# Patient Record
Sex: Male | Born: 1940 | Race: White | Hispanic: No | Smoking: Former smoker
Health system: Southern US, Community
[De-identification: ages and names within clinical notes are randomized; demographics above are authoritative.]

## PROBLEM LIST (undated history)

## (undated) ENCOUNTER — Emergency Department (HOSPITAL_COMMUNITY): Admission: EM | Payer: PRIVATE HEALTH INSURANCE

## (undated) DIAGNOSIS — L97509 Non-pressure chronic ulcer of other part of unspecified foot with unspecified severity: Secondary | ICD-10-CM

## (undated) DIAGNOSIS — J449 Chronic obstructive pulmonary disease, unspecified: Secondary | ICD-10-CM

## (undated) DIAGNOSIS — I998 Other disorder of circulatory system: Secondary | ICD-10-CM

## (undated) DIAGNOSIS — I10 Essential (primary) hypertension: Secondary | ICD-10-CM

## (undated) DIAGNOSIS — G709 Myoneural disorder, unspecified: Secondary | ICD-10-CM

## (undated) DIAGNOSIS — G4730 Sleep apnea, unspecified: Secondary | ICD-10-CM

## (undated) DIAGNOSIS — E119 Type 2 diabetes mellitus without complications: Secondary | ICD-10-CM

## (undated) DIAGNOSIS — E7800 Pure hypercholesterolemia, unspecified: Secondary | ICD-10-CM

## (undated) DIAGNOSIS — M1990 Unspecified osteoarthritis, unspecified site: Secondary | ICD-10-CM

## (undated) DIAGNOSIS — I739 Peripheral vascular disease, unspecified: Secondary | ICD-10-CM

## (undated) DIAGNOSIS — I5022 Chronic systolic (congestive) heart failure: Secondary | ICD-10-CM

## (undated) DIAGNOSIS — Z87442 Personal history of urinary calculi: Secondary | ICD-10-CM

## (undated) DIAGNOSIS — D62 Acute posthemorrhagic anemia: Secondary | ICD-10-CM

## (undated) DIAGNOSIS — R918 Other nonspecific abnormal finding of lung field: Secondary | ICD-10-CM

## (undated) DIAGNOSIS — I2510 Atherosclerotic heart disease of native coronary artery without angina pectoris: Secondary | ICD-10-CM

## (undated) DIAGNOSIS — M549 Dorsalgia, unspecified: Secondary | ICD-10-CM

## (undated) DIAGNOSIS — G629 Polyneuropathy, unspecified: Secondary | ICD-10-CM

## (undated) DIAGNOSIS — G8929 Other chronic pain: Secondary | ICD-10-CM

## (undated) DIAGNOSIS — I70229 Atherosclerosis of native arteries of extremities with rest pain, unspecified extremity: Secondary | ICD-10-CM

## (undated) DIAGNOSIS — K922 Gastrointestinal hemorrhage, unspecified: Secondary | ICD-10-CM

## (undated) DIAGNOSIS — Z952 Presence of prosthetic heart valve: Secondary | ICD-10-CM

## (undated) DIAGNOSIS — I219 Acute myocardial infarction, unspecified: Secondary | ICD-10-CM

## (undated) DIAGNOSIS — K219 Gastro-esophageal reflux disease without esophagitis: Secondary | ICD-10-CM

## (undated) HISTORY — DX: Atherosclerosis of native arteries of extremities with rest pain, unspecified extremity: I70.229

## (undated) HISTORY — DX: Other disorder of circulatory system: I99.8

## (undated) HISTORY — PX: OTHER SURGICAL HISTORY: SHX169

## (undated) HISTORY — PX: ROTATOR CUFF REPAIR: SHX139

## (undated) HISTORY — DX: Peripheral vascular disease, unspecified: I73.9

## (undated) HISTORY — PX: CATARACT EXTRACTION: SUR2

## (undated) HISTORY — PX: EYE SURGERY: SHX253

## (undated) HISTORY — DX: Chronic obstructive pulmonary disease, unspecified: J44.9

## (undated) HISTORY — DX: Sleep apnea, unspecified: G47.30

## (undated) HISTORY — PX: ANGIOPLASTY ILLIAC ARTERY: SHX5720

## (undated) HISTORY — PX: BACK SURGERY: SHX140

## (undated) HISTORY — DX: Pure hypercholesterolemia, unspecified: E78.00

## (undated) HISTORY — DX: Presence of prosthetic heart valve: Z95.2

---

## 1988-08-19 DIAGNOSIS — Z952 Presence of prosthetic heart valve: Secondary | ICD-10-CM

## 1988-08-19 HISTORY — DX: Presence of prosthetic heart valve: Z95.2

## 1988-08-19 HISTORY — PX: AORTIC VALVE REPLACEMENT: SHX41

## 1992-08-19 HISTORY — PX: CORONARY ARTERY BYPASS GRAFT: SHX141

## 2001-07-13 ENCOUNTER — Encounter: Payer: Self-pay | Admitting: Internal Medicine

## 2001-07-13 ENCOUNTER — Ambulatory Visit (HOSPITAL_COMMUNITY): Admission: RE | Admit: 2001-07-13 | Discharge: 2001-07-13 | Payer: Self-pay | Admitting: Internal Medicine

## 2001-07-20 ENCOUNTER — Ambulatory Visit (HOSPITAL_COMMUNITY): Admission: RE | Admit: 2001-07-20 | Discharge: 2001-07-20 | Payer: Self-pay | Admitting: Internal Medicine

## 2001-07-20 ENCOUNTER — Encounter: Payer: Self-pay | Admitting: Internal Medicine

## 2001-11-24 ENCOUNTER — Ambulatory Visit (HOSPITAL_COMMUNITY): Admission: RE | Admit: 2001-11-24 | Discharge: 2001-11-24 | Payer: Self-pay | Admitting: Internal Medicine

## 2001-11-24 ENCOUNTER — Encounter: Payer: Self-pay | Admitting: Internal Medicine

## 2001-11-26 ENCOUNTER — Ambulatory Visit (HOSPITAL_COMMUNITY): Admission: RE | Admit: 2001-11-26 | Discharge: 2001-11-26 | Payer: Self-pay | Admitting: Internal Medicine

## 2001-11-26 ENCOUNTER — Encounter: Payer: Self-pay | Admitting: Internal Medicine

## 2002-04-01 ENCOUNTER — Emergency Department (HOSPITAL_COMMUNITY): Admission: EM | Admit: 2002-04-01 | Discharge: 2002-04-01 | Payer: Self-pay | Admitting: Emergency Medicine

## 2002-06-30 ENCOUNTER — Encounter: Payer: Self-pay | Admitting: Cardiovascular Disease

## 2002-06-30 ENCOUNTER — Inpatient Hospital Stay (HOSPITAL_COMMUNITY): Admission: AD | Admit: 2002-06-30 | Discharge: 2002-07-09 | Payer: Self-pay | Admitting: Cardiovascular Disease

## 2002-07-03 ENCOUNTER — Encounter: Payer: Self-pay | Admitting: Cardiovascular Disease

## 2002-07-08 ENCOUNTER — Encounter: Payer: Self-pay | Admitting: Cardiovascular Disease

## 2002-11-18 ENCOUNTER — Encounter: Payer: Self-pay | Admitting: Internal Medicine

## 2002-11-18 ENCOUNTER — Ambulatory Visit (HOSPITAL_COMMUNITY): Admission: RE | Admit: 2002-11-18 | Discharge: 2002-11-18 | Payer: Self-pay | Admitting: Internal Medicine

## 2003-02-07 ENCOUNTER — Inpatient Hospital Stay (HOSPITAL_COMMUNITY): Admission: RE | Admit: 2003-02-07 | Discharge: 2003-02-10 | Payer: Self-pay | Admitting: Cardiovascular Disease

## 2003-02-07 ENCOUNTER — Encounter: Payer: Self-pay | Admitting: Cardiovascular Disease

## 2003-05-23 ENCOUNTER — Encounter: Payer: Self-pay | Admitting: Internal Medicine

## 2003-05-23 ENCOUNTER — Inpatient Hospital Stay (HOSPITAL_COMMUNITY): Admission: AD | Admit: 2003-05-23 | Discharge: 2003-05-26 | Payer: Self-pay | Admitting: Internal Medicine

## 2003-05-25 ENCOUNTER — Encounter: Payer: Self-pay | Admitting: Cardiovascular Disease

## 2003-08-20 HISTORY — PX: CORONARY STENT PLACEMENT: SHX1402

## 2003-12-28 ENCOUNTER — Inpatient Hospital Stay (HOSPITAL_COMMUNITY): Admission: AD | Admit: 2003-12-28 | Discharge: 2004-01-03 | Payer: Self-pay | Admitting: Cardiovascular Disease

## 2004-06-07 ENCOUNTER — Inpatient Hospital Stay (HOSPITAL_COMMUNITY): Admission: EM | Admit: 2004-06-07 | Discharge: 2004-06-18 | Payer: Self-pay | Admitting: Emergency Medicine

## 2004-06-11 ENCOUNTER — Encounter (INDEPENDENT_AMBULATORY_CARE_PROVIDER_SITE_OTHER): Payer: Self-pay | Admitting: *Deleted

## 2004-06-26 ENCOUNTER — Ambulatory Visit (HOSPITAL_COMMUNITY): Admission: RE | Admit: 2004-06-26 | Discharge: 2004-06-26 | Payer: Self-pay | Admitting: Cardiology

## 2004-10-17 ENCOUNTER — Inpatient Hospital Stay (HOSPITAL_COMMUNITY): Admission: AD | Admit: 2004-10-17 | Discharge: 2004-10-23 | Payer: Self-pay | Admitting: Cardiovascular Disease

## 2005-01-07 ENCOUNTER — Inpatient Hospital Stay (HOSPITAL_COMMUNITY): Admission: RE | Admit: 2005-01-07 | Discharge: 2005-01-12 | Payer: Self-pay | Admitting: Vascular Surgery

## 2005-08-19 DIAGNOSIS — E119 Type 2 diabetes mellitus without complications: Secondary | ICD-10-CM

## 2005-08-19 HISTORY — DX: Type 2 diabetes mellitus without complications: E11.9

## 2006-09-14 ENCOUNTER — Emergency Department (HOSPITAL_COMMUNITY): Admission: EM | Admit: 2006-09-14 | Discharge: 2006-09-14 | Payer: Self-pay | Admitting: Emergency Medicine

## 2007-08-24 ENCOUNTER — Ambulatory Visit (HOSPITAL_COMMUNITY): Admission: RE | Admit: 2007-08-24 | Discharge: 2007-08-24 | Payer: Self-pay | Admitting: Ophthalmology

## 2007-09-13 ENCOUNTER — Inpatient Hospital Stay (HOSPITAL_COMMUNITY): Admission: EM | Admit: 2007-09-13 | Discharge: 2007-09-18 | Payer: Self-pay | Admitting: Emergency Medicine

## 2009-02-06 ENCOUNTER — Ambulatory Visit (HOSPITAL_COMMUNITY): Admission: RE | Admit: 2009-02-06 | Discharge: 2009-02-06 | Payer: Self-pay | Admitting: Ophthalmology

## 2009-09-22 ENCOUNTER — Ambulatory Visit (HOSPITAL_COMMUNITY): Admission: RE | Admit: 2009-09-22 | Discharge: 2009-09-22 | Payer: Self-pay | Admitting: Internal Medicine

## 2010-06-04 ENCOUNTER — Inpatient Hospital Stay (HOSPITAL_COMMUNITY)
Admission: RE | Admit: 2010-06-04 | Discharge: 2010-06-11 | Payer: Self-pay | Source: Home / Self Care | Admitting: Cardiovascular Disease

## 2010-07-19 ENCOUNTER — Inpatient Hospital Stay (HOSPITAL_COMMUNITY)
Admission: RE | Admit: 2010-07-19 | Discharge: 2010-07-24 | Payer: Self-pay | Source: Home / Self Care | Admitting: Cardiovascular Disease

## 2010-09-14 ENCOUNTER — Emergency Department (HOSPITAL_COMMUNITY)
Admission: EM | Admit: 2010-09-14 | Discharge: 2010-09-14 | Payer: Self-pay | Source: Home / Self Care | Admitting: Emergency Medicine

## 2010-09-14 ENCOUNTER — Ambulatory Visit (HOSPITAL_COMMUNITY)
Admission: RE | Admit: 2010-09-14 | Discharge: 2010-09-14 | Payer: Self-pay | Source: Home / Self Care | Attending: Internal Medicine | Admitting: Internal Medicine

## 2010-09-17 ENCOUNTER — Ambulatory Visit
Admission: RE | Admit: 2010-09-17 | Discharge: 2010-09-17 | Payer: Self-pay | Source: Home / Self Care | Attending: Orthopedic Surgery | Admitting: Orthopedic Surgery

## 2010-09-17 ENCOUNTER — Encounter: Payer: Self-pay | Admitting: Orthopedic Surgery

## 2010-09-17 DIAGNOSIS — S92309A Fracture of unspecified metatarsal bone(s), unspecified foot, initial encounter for closed fracture: Secondary | ICD-10-CM | POA: Insufficient documentation

## 2010-09-26 NOTE — Assessment & Plan Note (Signed)
Summary: EVAL/TREAT FX 5TH METATARSAL/HAD XRAY/REF DR FUSCO/SEC HORIZ/...   Vital Signs:  Patient profile:   70 year old male Height:      70 inches Weight:      222 pounds Pulse rate:   72 / minute Resp:     16 per minute  Vitals Entered By: Fuller [name] MD (September 17, 2010 2:26 PM)  Visit Type:  new patient Referring Provider:  ap er Primary Provider:  Dr. Sherwood Gambler  CC:  left foot pain.  History of Present Illness: I saw Tanner Pena in the office today for an initial visit.  He is a 70 years old man with the complaint of:  left foot pain.  DOI 1 month ago.  Xrays [facility] 09/14/10 left foot.  medications are scanned into the chart.  The patient was injured about 4 weeks ago, had an x-ray over the weekend, which showed a metatarsal fracture. #5 longitudinal split with slight spiral component. Complains of minimal discomfort now, but he's been in a Cam Walker for the weekend.  No bruising, had some swelling.  Her pain level now is 3/10. Lambert Mody tends to come and go. Improved with nonweightbearing or not walking, worse with walking without the boot   Allergies (verified): No Known Drug Allergies  Past History:  Past Medical History: htn cholesterol diabetes asthma gout  Past Surgical History: back open heart stents in heart rt and left leg  Family History: Family History of Diabetes Family History Coronary Heart Disease male < 51 Family History of Arthritis  Social History: smokes 1/2 ppd alcohol use sometimes no caffeine no schooling  Review of Systems Constitutional:  Denies weight loss, weight gain, fever, chills, and fatigue. Cardiovascular:  Denies chest pain, palpitations, fainting, and murmurs. Respiratory:  Complains of short of breath and couch; denies wheezing, tightness, pain on inspiration, and snoring . Gastrointestinal:  Denies heartburn, nausea, vomiting, diarrhea, constipation, and blood in your stools. Genitourinary:  Denies  frequency, urgency, difficulty urinating, painful urination, flank pain, and bleeding in urine. Neurologic:  Denies numbness, tingling, unsteady gait, dizziness, tremors, and seizure. Musculoskeletal:  Complains of swelling; denies joint pain, instability, stiffness, redness, heat, and muscle pain. Endocrine:  Denies excessive thirst, exessive urination, and heat or cold intolerance. Psychiatric:  Denies nervousness, depression, anxiety, and hallucinations. Skin:  Denies changes in the skin, poor healing, rash, itching, and redness. HEENT:  Denies blurred or double vision, eye pain, redness, and watering. Immunology:  Denies seasonal allergies, sinus problems, and allergic to bee stings. Hemoatologic:  Complains of easy bleeding; denies brusing.  Physical Exam  Additional Exam:  height is 5 feet 10, weight is 222 pounds.  He is awake, alert, and oriented x3. His mood and affect are normal. Gait, station is remarkable for a slight limp when he is in the boot.  His feet are hyperemic. He does have a palpable dorsalis pedis pulse. He has nail changes consistent with vascular disease. He has normal range of motion ankle discomfort over the fracture site. Skin remains intact. Sensation is normal. His coordination balancing normal as well.  Muscle tone is normal in the foot and ankle. Minimal swelling   Impression & Recommendations:  Problem # 1:  CLOSED FRACTURE OF METATARSAL BONE (ICD-825.25) Assessment New  The x-rays were done at [facility]. The report and the films have been reviewed.  Orders: New Patient Level III (16109) Metatarsal Fx (60454)  Patient Instructions: 1)  Please schedule a follow-up appointment in 4 weeks for xrays of the  left foot    Orders Added: 1)  New Patient Level III [46962] 2)  Metatarsal Fx [95284]

## 2010-09-26 NOTE — Letter (Signed)
Summary: *Orthopedic Consult Note  [facility]  [address] [place] [address]  [place], [place] [address]   Phone: [phone]  Fax: [phone]    Re:    [name] DOB:    August 11, 1941   Dear: Dear Peyton Najjar.   I' m sorry I could not see this patient Fri, I was in surgery and had a delay.   A copy of the detailed office note will be sent under separate cover, for your review.  Evaluation today is consistent with:  1)  CLOSED FRACTURE OF METATARSAL BONE (ICD-825.25)      Our recommendation is for: CAM walker and repeat x-rays in 4 weeks        Thank you for this opportunity to look after your patient.  Sincerely,   Terrance Mass. MD.

## 2010-10-16 NOTE — Letter (Signed)
Summary: History form  History form   Imported By: Jacklynn Ganong 10/09/2010 14:06:05  _____________________________________________________________________  External Attachment:    Type:   Image     Comment:   External Document

## 2010-10-16 NOTE — Medication Information (Signed)
Summary: RX Folder medication list  RX Folder medication list   Imported By: Cammie Sickle 10/09/2010 19:40:37  _____________________________________________________________________  External Attachment:    Type:   Image     Comment:   External Document

## 2010-10-18 ENCOUNTER — Other Ambulatory Visit: Payer: Self-pay | Admitting: Orthopedic Surgery

## 2010-10-18 ENCOUNTER — Encounter: Payer: Self-pay | Admitting: Orthopedic Surgery

## 2010-10-18 ENCOUNTER — Ambulatory Visit (INDEPENDENT_AMBULATORY_CARE_PROVIDER_SITE_OTHER): Payer: Medicare Other | Admitting: Orthopedic Surgery

## 2010-10-18 DIAGNOSIS — S92309A Fracture of unspecified metatarsal bone(s), unspecified foot, initial encounter for closed fracture: Secondary | ICD-10-CM

## 2010-10-25 NOTE — Assessment & Plan Note (Signed)
Summary: 4 wk RE-CK/XRAY LT FOOT/FX CARE/[employer] + CA MCD/CAF (R/S d/t vis...   Visit Type:  Follow-up Referring Provider:  ap er Primary Provider:  Dr. Sherwood Gambler  CC:  fx care.  History of Present Illness: I saw Tanner Pena in the office today for a visit.  He is a 70 years old man with the complaint of:  left foot pain. Problem # 1:  CLOSED FRACTURE OF METATARSAL BONE (ICD-825.25)  DOI 2 months ago.  Xrays [facility] 09/14/10 left foot.  meds: percocet   Today is recheck with xrays of the left foot after cam walker treatment.  Doing better.  No new injuries.  Pain level is around 2 sometimes. he is walking fine at this point. He has minimal symptoms unless he walks for a "good piece "    Allergies: No Known Drug Allergies   Impression & Recommendations:  Problem # 1:  CLOSED FRACTURE OF METATARSAL BONE (ICD-825.25) Assessment Improved  Separate and Identifiable X-Ray report      3 views left foot   spiral MTT fracture # 5  probably fibrous union   Orders: Post-Op Check (98119) Foot x-ray complete, minimum 3 views (14782)  Patient Instructions: 1)  Please schedule a follow-up appointment as needed.   Orders Added: 1)  Post-Op Check [99024] 2)  Foot x-ray complete, minimum 3 views [73630]

## 2010-10-29 LAB — CBC
HCT: 38.7 % — ABNORMAL LOW (ref 39.0–52.0)
HCT: 39.4 % (ref 39.0–52.0)
HCT: 40.9 % (ref 39.0–52.0)
HCT: 41.4 % (ref 39.0–52.0)
Hemoglobin: 13.1 g/dL (ref 13.0–17.0)
Hemoglobin: 13.7 g/dL (ref 13.0–17.0)
MCH: 30.6 pg (ref 26.0–34.0)
MCH: 30.7 pg (ref 26.0–34.0)
MCHC: 33.1 g/dL (ref 30.0–36.0)
MCHC: 33.2 g/dL (ref 30.0–36.0)
MCHC: 33.5 g/dL (ref 30.0–36.0)
MCHC: 33.6 g/dL (ref 30.0–36.0)
MCV: 91.7 fL (ref 78.0–100.0)
MCV: 92.3 fL (ref 78.0–100.0)
MCV: 92.4 fL (ref 78.0–100.0)
Platelets: 165 10*3/uL (ref 150–400)
Platelets: 169 10*3/uL (ref 150–400)
Platelets: 177 10*3/uL (ref 150–400)
RBC: 4.32 MIL/uL (ref 4.22–5.81)
RDW: 14.1 % (ref 11.5–15.5)
RDW: 14.2 % (ref 11.5–15.5)
WBC: 7.2 10*3/uL (ref 4.0–10.5)

## 2010-10-29 LAB — PROTIME-INR
INR: 1.04 (ref 0.00–1.49)
INR: 1.92 — ABNORMAL HIGH (ref 0.00–1.49)
Prothrombin Time: 13.8 seconds (ref 11.6–15.2)
Prothrombin Time: 14.7 seconds (ref 11.6–15.2)
Prothrombin Time: 17.9 seconds — ABNORMAL HIGH (ref 11.6–15.2)

## 2010-10-29 LAB — URINE MICROSCOPIC-ADD ON

## 2010-10-29 LAB — URINALYSIS, DIPSTICK ONLY
Glucose, UA: 100 mg/dL — AB
Ketones, ur: 15 mg/dL — AB
Nitrite: POSITIVE — AB
Protein, ur: >300 mg/dL — AB
Specific Gravity, Urine: 1.046 — ABNORMAL HIGH (ref 1.005–1.030)
Urobilinogen, UA: 0.2 mg/dL (ref 0.0–1.0)
pH: 6.5 (ref 5.0–8.0)

## 2010-10-29 LAB — GLUCOSE, CAPILLARY
Glucose-Capillary: 145 mg/dL — ABNORMAL HIGH (ref 70–99)
Glucose-Capillary: 151 mg/dL — ABNORMAL HIGH (ref 70–99)
Glucose-Capillary: 157 mg/dL — ABNORMAL HIGH (ref 70–99)
Glucose-Capillary: 157 mg/dL — ABNORMAL HIGH (ref 70–99)
Glucose-Capillary: 159 mg/dL — ABNORMAL HIGH (ref 70–99)
Glucose-Capillary: 167 mg/dL — ABNORMAL HIGH (ref 70–99)
Glucose-Capillary: 171 mg/dL — ABNORMAL HIGH (ref 70–99)
Glucose-Capillary: 179 mg/dL — ABNORMAL HIGH (ref 70–99)
Glucose-Capillary: 183 mg/dL — ABNORMAL HIGH (ref 70–99)
Glucose-Capillary: 195 mg/dL — ABNORMAL HIGH (ref 70–99)
Glucose-Capillary: 237 mg/dL — ABNORMAL HIGH (ref 70–99)

## 2010-10-29 LAB — URINALYSIS, ROUTINE W REFLEX MICROSCOPIC
Bilirubin Urine: NEGATIVE
Glucose, UA: NEGATIVE mg/dL
Ketones, ur: NEGATIVE mg/dL
Nitrite: NEGATIVE
Protein, ur: 100 mg/dL — AB
Specific Gravity, Urine: 1.038 — ABNORMAL HIGH (ref 1.005–1.030)
Urobilinogen, UA: 0.2 mg/dL (ref 0.0–1.0)
pH: 7 (ref 5.0–8.0)

## 2010-10-29 LAB — URINE CULTURE
Colony Count: 6000
Culture  Setup Time: 201112011742

## 2010-10-29 LAB — BASIC METABOLIC PANEL
BUN: 15 mg/dL (ref 6–23)
CO2: 26 mEq/L (ref 19–32)
Calcium: 9.2 mg/dL (ref 8.4–10.5)
Chloride: 102 mEq/L (ref 96–112)
Creatinine, Ser: 0.99 mg/dL (ref 0.4–1.5)
GFR calc Af Amer: >60 mL/min (ref >60)
GFR calc non Af Amer: >60 mL/min (ref >60)
Glucose, Bld: 112 mg/dL — ABNORMAL HIGH (ref 70–99)
Potassium: 3.3 mEq/L — ABNORMAL LOW (ref 3.5–5.1)
Sodium: 139 mEq/L (ref 135–145)

## 2010-10-29 LAB — HEPARIN LEVEL (UNFRACTIONATED)
Heparin Unfractionated: 0.14 IU/mL — ABNORMAL LOW (ref 0.30–0.70)
Heparin Unfractionated: 0.37 IU/mL (ref 0.30–0.70)
Heparin Unfractionated: 0.45 IU/mL (ref 0.30–0.70)
Heparin Unfractionated: 0.48 IU/mL (ref 0.30–0.70)

## 2010-10-29 LAB — CARDIAC PANEL(CRET KIN+CKTOT+MB+TROPI)
CK, MB: 2.6 ng/mL (ref 0.3–4.0)
CK, MB: 3.8 ng/mL (ref 0.3–4.0)
Relative Index: 2.8 — ABNORMAL HIGH (ref 0.0–2.5)
Relative Index: INVALID (ref 0.0–2.5)
Total CK: 138 U/L (ref 7–232)
Total CK: 87 U/L (ref 7–232)
Troponin I: 0.03 ng/mL (ref 0.00–0.06)
Troponin I: 0.04 ng/mL (ref 0.00–0.06)

## 2010-10-29 LAB — CK TOTAL AND CKMB (NOT AT ARMC)
CK, MB: 2.3 ng/mL (ref 0.3–4.0)
Relative Index: INVALID (ref 0.0–2.5)
Total CK: 82 U/L (ref 7–232)

## 2010-10-29 LAB — BRAIN NATRIURETIC PEPTIDE: Pro B Natriuretic peptide (BNP): <30 pg/mL (ref 0.0–100.0)

## 2010-10-29 LAB — TROPONIN I: Troponin I: 0.03 ng/mL (ref 0.00–0.06)

## 2010-10-31 LAB — GLUCOSE, CAPILLARY
Glucose-Capillary: 138 mg/dL — ABNORMAL HIGH (ref 70–99)
Glucose-Capillary: 141 mg/dL — ABNORMAL HIGH (ref 70–99)
Glucose-Capillary: 144 mg/dL — ABNORMAL HIGH (ref 70–99)
Glucose-Capillary: 145 mg/dL — ABNORMAL HIGH (ref 70–99)
Glucose-Capillary: 161 mg/dL — ABNORMAL HIGH (ref 70–99)
Glucose-Capillary: 168 mg/dL — ABNORMAL HIGH (ref 70–99)
Glucose-Capillary: 169 mg/dL — ABNORMAL HIGH (ref 70–99)
Glucose-Capillary: 175 mg/dL — ABNORMAL HIGH (ref 70–99)
Glucose-Capillary: 180 mg/dL — ABNORMAL HIGH (ref 70–99)
Glucose-Capillary: 180 mg/dL — ABNORMAL HIGH (ref 70–99)
Glucose-Capillary: 186 mg/dL — ABNORMAL HIGH (ref 70–99)
Glucose-Capillary: 195 mg/dL — ABNORMAL HIGH (ref 70–99)
Glucose-Capillary: 196 mg/dL — ABNORMAL HIGH (ref 70–99)
Glucose-Capillary: 231 mg/dL — ABNORMAL HIGH (ref 70–99)

## 2010-10-31 LAB — HEPARIN LEVEL (UNFRACTIONATED)
Heparin Unfractionated: 0.2 IU/mL — ABNORMAL LOW (ref 0.30–0.70)
Heparin Unfractionated: 0.28 IU/mL — ABNORMAL LOW (ref 0.30–0.70)
Heparin Unfractionated: 0.38 IU/mL (ref 0.30–0.70)
Heparin Unfractionated: 0.39 IU/mL (ref 0.30–0.70)
Heparin Unfractionated: 0.41 IU/mL (ref 0.30–0.70)
Heparin Unfractionated: 0.43 IU/mL (ref 0.30–0.70)
Heparin Unfractionated: 0.48 IU/mL (ref 0.30–0.70)
Heparin Unfractionated: <0.1 IU/mL — ABNORMAL LOW (ref 0.30–0.70)

## 2010-10-31 LAB — CBC
HCT: 41.2 % (ref 39.0–52.0)
HCT: 42.1 % (ref 39.0–52.0)
Hemoglobin: 13.8 g/dL (ref 13.0–17.0)
Hemoglobin: 14.2 g/dL (ref 13.0–17.0)
Hemoglobin: 14.3 g/dL (ref 13.0–17.0)
MCH: 30.8 pg (ref 26.0–34.0)
MCH: 30.9 pg (ref 26.0–34.0)
MCH: 30.9 pg (ref 26.0–34.0)
MCH: 31.1 pg (ref 26.0–34.0)
MCH: 31.4 pg (ref 26.0–34.0)
MCHC: 33.1 g/dL (ref 30.0–36.0)
MCHC: 33.5 g/dL (ref 30.0–36.0)
MCV: 92.3 fL (ref 78.0–100.0)
MCV: 92.3 fL (ref 78.0–100.0)
MCV: 92.8 fL (ref 78.0–100.0)
MCV: 94.3 fL (ref 78.0–100.0)
Platelets: 168 10*3/uL (ref 150–400)
Platelets: 184 10*3/uL (ref 150–400)
Platelets: 192 10*3/uL (ref 150–400)
Platelets: 200 10*3/uL (ref 150–400)
Platelets: 202 10*3/uL (ref 150–400)
RBC: 4.4 MIL/uL (ref 4.22–5.81)
RBC: 4.56 MIL/uL (ref 4.22–5.81)
RBC: 4.56 MIL/uL (ref 4.22–5.81)
RBC: 4.68 MIL/uL (ref 4.22–5.81)
RDW: 14.2 % (ref 11.5–15.5)
RDW: 14.4 % (ref 11.5–15.5)
RDW: 14.5 % (ref 11.5–15.5)
WBC: 7.1 10*3/uL (ref 4.0–10.5)
WBC: 7.3 10*3/uL (ref 4.0–10.5)
WBC: 7.4 10*3/uL (ref 4.0–10.5)

## 2010-10-31 LAB — PROTIME-INR
INR: 1.01 (ref 0.00–1.49)
INR: 1.04 (ref 0.00–1.49)
INR: 1.15 (ref 0.00–1.49)
INR: 1.57 — ABNORMAL HIGH (ref 0.00–1.49)
Prothrombin Time: 13.5 seconds (ref 11.6–15.2)
Prothrombin Time: 14.9 seconds (ref 11.6–15.2)
Prothrombin Time: 21.5 seconds — ABNORMAL HIGH (ref 11.6–15.2)
Prothrombin Time: 29.9 seconds — ABNORMAL HIGH (ref 11.6–15.2)

## 2010-10-31 LAB — BASIC METABOLIC PANEL
BUN: 17 mg/dL (ref 6–23)
Creatinine, Ser: 0.8 mg/dL (ref 0.4–1.5)
GFR calc non Af Amer: >60 mL/min (ref >60)

## 2010-11-26 LAB — BASIC METABOLIC PANEL
Chloride: 101 mEq/L (ref 96–112)
Creatinine, Ser: 1.02 mg/dL (ref 0.4–1.5)
GFR calc Af Amer: >60 mL/min (ref >60)
Sodium: 134 mEq/L — ABNORMAL LOW (ref 135–145)

## 2010-11-26 LAB — HEMOGLOBIN AND HEMATOCRIT, BLOOD
HCT: 45.5 % (ref 39.0–52.0)
Hemoglobin: 15.9 g/dL (ref 13.0–17.0)

## 2010-11-26 LAB — GLUCOSE, CAPILLARY

## 2011-01-01 NOTE — Procedures (Signed)
NAME:  Tanner Pena, Tanner Pena NO.:  [id number]   MEDICAL RECORD NO.:  [id number]          PATIENT TYPE:  INP   LOCATION:  6529                         FACILITY:  [facility]   PHYSICIAN:  Nanetta Batty, M.D.   DATE OF BIRTH:  1940-09-28   DATE OF PROCEDURE:  DATE OF DISCHARGE:                    PERIPHERAL VASCULAR INVASIVE PROCEDURE    Tanner Pena is a 70 year old mildly overweight single Caucasian male,  father of 19, grandfather of 6 grandchildren.  Patient of Dr. Evlyn Clines.  He has a history of CAD and PAD.  He is status post coronary  artery bypass grafting in 1990.  He has had St. Jude AVR at that time.  He had redo bypass surgery in 1994.  He had stent placed to the ostium  of his RCA vein graft by Dr. Clarene Duke in 2005.  He has also had right  iliac and bilateral SFA interventions in the past.  He has moderate  carotid disease that is neurologically asymptomatic.  He recently  stopped smoking on June 04, 2010, on the day of his previous  peripheral intervention which I performed on his left SFA.  His other  problems include hypertension, hyperlipidemia, noninsulin-requiring  diabetes.  He has obstructive sleep apnea but does not wear a CPAP.  He  had marked improvement in his ability to ambulate on his left leg as a  result of PTA and stenting.  He had highly calcified proximal right SFA  stenosis.  He presents now after having stopped his Coumadin 5 days ago  for [facility] orbital rotational atherectomy, PTA and stenting of his  right SFA for lifestyle-limiting claudication.   PROCEDURE DESCRIPTION:  The patient was brought to the second floor of  [facility] angi[facility] suite in the postabsorptive state.  He was  premedicated with p.o. Valium, IV Versed and fentanyl.  Left groin was  prepped and shaved in usual sterile fashion.  Xylocaine 1% was used for  local anesthesia.  A 5-French sheath was inserted into the left femoral  artery using standard  Seldinger technique.  A 5-French crossover  catheter and Wholey wire followed by a 7-French crossover sheath was  used to obtain contralateral access.  The patient received a total of  10,000 units of heparin intravenously with an ACT of 230.  The [facility]  wire was advanced across the calcified area of segmental disease beyond  the previously stented SFA segment.  It was then exchanged over a Quick-  Cross catheter for a 0.014 long Viper wire.  Orbital rotational  atherectomy was performed with a 2.0 bur Predator stealth atherectomy  device up to 120 with multiple passes.  This resulted in reduction of  90% highly calcified stenoses to approximately 60% to 70%.  Following  this, progressive balloon dilatations were performed with a 4 x 10  followed by a 4 x 10 balloon after exchanging the Viper back for a  Versacore wire.  A 7 x 120 SMART nitinol self-expanding stent was then  deployed across the diseased segment and postdilated with a 6 x 100  balloon.  There was residual disease at the overlap segment which  was  dilated with a 6 x 4 Durata high-pressure noncompliant balloon at 14  atmospheres.  The final angiographic result was less than 10% residual  throughout the highly diseased calcified segment.  Distal runoff was  documented angiographically.  The patient tolerated the procedure well.  The sheath was then withdrawn back across the bifurcation.  The patient  left the lab in stable condition.  Sheath will be removed  once the ACT falls below 170.  Pharmacy will restart heparin in 4 hours  as well as Coumadin tonight because of the St. Jude AVR.  He will be  discharged home once his INR is greater than equal to 2.5.  He left the  lab in stable condition.  Total of 90 mL of contrast was used during the  case.      Nanetta Batty, M.D.      JB/MEDQ  D:  07/19/2010  T:  07/20/2010  Job:  [id number]   cc:   [facility].  [facility]  [employer]. Sherwood Gambler,  MD  Nicki Guadalajara, M.D.   Electronically Signed by Nanetta Batty M.D. on 08/05/2010 03:02:03 PM

## 2011-01-01 NOTE — Discharge Summary (Signed)
NAME:  Tanner Pena, PANUCO NO.:  [id number]   MEDICAL RECORD NO.:  [id number]          PATIENT TYPE:  INP   LOCATION:  3728                         FACILITY:  [facility]   PHYSICIAN:  Nicki Guadalajara, M.D.     DATE OF BIRTH:  11-10-1940   DATE OF ADMISSION:  09/13/2007  DATE OF DISCHARGE:  09/18/2007                               DISCHARGE SUMMARY   ADDENDUM TO A DICTATED DISCHARGE SUMMARY:  Dictating number [phone].   MEDICATION:  Medication adjustments were made at the time of discharge.  He is being discharged home on Norvasc 5 mg every day, Plavix 75 mg  every day, Zocor 40 mg every day, glimepiride 4 mg every day, Coumadin  7.5 mg daily, Hyzaar 100/25 mg every day, potassium 20 mEq every day,  Tricor 145 mg every day, allopurinol 300 mg every day, Lyrica 50 mg  every day, Toprol XL 75 mg every day, Lasix 40 mg when he needs it  p.r.n. for swelling and when he takes Lasix he should take an extra  potassium 20 mEq a day, nitroglycerin 150 p.r.n. every 5 minutes x3 if  he needs for chest pain.  If he still has chest pain after the third  nitroglycerin, he should call 9-1-1 or call our office for further  instructions, isosorbide mononitrate 30 mg a day.      Lezlie Octave, N.P.    ______________________________  Nicki Guadalajara, M.D.    BB/MEDQ  D:  09/18/2007  T:  09/18/2007  Job:  [id number]   cc:   Madelin Rear. Sherwood Gambler, MD

## 2011-01-01 NOTE — Discharge Summary (Signed)
NAME:  Tanner Pena, Tanner Pena NO.:  [id number]   MEDICAL RECORD NO.:  [id number]          PATIENT TYPE:  INP   LOCATION:  3728                         FACILITY:  [facility]   PHYSICIAN:  Nicki Guadalajara, M.D.     DATE OF BIRTH:  1941/06/25   DATE OF ADMISSION:  09/13/2007  DATE OF DISCHARGE:  09/18/2007                               DISCHARGE SUMMARY   HISTORY:  [name] is a 70 year old male patient of Dr. Nicki Guadalajara  who has known coronary artery disease.  He underwent bypass grafting and  a St. Jude AVR.  He came to the emergency room with complaints of chest  pain.  He states he had 7 hours of constant chest pain.  It was decided  that he should be admitted to rule out MI.  He was on Coumadin.  However, his INR was not therapeutic.  He was placed on IV heparin.  His  CK-MBs and troponins were all negative.  It was decided that this pain  was somewhat atypical.  He underwent in-hospital Persantine Myoview  which revealed an EF of 47%.  He had no significant reversible ischemia.  He was seen by Dr. Tresa Endo on September 18, 2007, his INR was 2.1.  He was  considered stable for discharge home.  He did have a run of SVT,  however, his potassium was low at the time at 3.3.  He also had some  bradycardia at the time of discharge.  Dr. Tresa Endo decided to increase his  Toprol to 75 mg a day.  He was having frequent PVCs.  His potassium was  up to 4.1.  On the day of discharge, his blood pressure was 112/64,  heart rate was 64, respirations 20, temperature was 96.9.  He had no  further chest pain during his hospitalization.   LABORATORY DATA:  Hemoglobin 15.4, hematocrit 45.2, WBCs 9.6, platelets  291, sodium was 139, potassium 4.1, chloride 103, CO2 30, BUN 16,  creatinine 0.97.  As stated previously on September 17, 2007, potassium  was 3.3, INR on the day of discharge was 2.1, on admission it was 1.8.  His Coumadin had recently been increased.  Cardiac enzymes were negative  x4.   Troponin is negative also.  Calcium was 10.  BNP was less than 30.  Uric acid was 3.9.   DIAGNOSTICS:  Chest x-ray showed cardiomegaly, no acute disease.   DISCHARGE DIAGNOSES:  1. Chest pain, atypical, probable musculoskeletal or related to      gastroesophageal reflux disease.  2. Known coronary artery disease with history of coronary bypass      grafting in 1990 and re-do coronary artery bypass graft with aortic      valve replacement in 1994.  His last cath was in 2005 with right      coronary artery saphenous vein graft stenting and TAXUS stenting.  3. Saint Jude aortic valve replacement in 1994.  4. Severe peripheral vascular disease of his lower extremities.  5. 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  6. Hyperlipidemia.  7. Hypertension.  8. Anticoagulation for a Saint Jude valve.  9. Ejection fraction of 47% by QGS.  10.Status post Persantine Myoview this admission with no significant      reversible ischemia.  11.Frequent premature ventricular contractions and 1 run of      supraventricular tachycardia when his potassium was low with some      bradycardia.      Lezlie Octave, N.P.    ______________________________  Nicki Guadalajara, M.D.    BB/MEDQ  D:  09/18/2007  T:  09/18/2007  Job:  [id number]   cc:   Madelin Rear. Sherwood Gambler, MD

## 2011-01-01 NOTE — Procedures (Signed)
NAME:  Tanner Pena, Tanner Pena NO.:  [id number]   MEDICAL RECORD NO.:  [id number]          PATIENT TYPE:  INP   LOCATION:  2503                         FACILITY:  [facility]   PHYSICIAN:  Nanetta Batty, M.D.   DATE OF BIRTH:  [birth year]   DATE OF PROCEDURE:  DATE OF DISCHARGE:                    PERIPHERAL VASCULAR INVASIVE PROCEDURE    Mr. Palma is a 70 year old mildly overweight single Caucasian male,  father of 6, grandfather of 6 grandchildren, patient of Dr. Evlyn Clines.  He has a history of CAD and PAD status post coronary artery  bypass grafting initially in 1990 with St. Jude AVR at that time.  He  had redo bypass surgery in 1994.  He had stents placed in the ostium of  his RCA vein graft by Dr. Clarene Duke in 2005.  He has right external iliac  artery PTA and stenting as well as bilateral SFA intervention as well.  He has moderate carotid disease but is neurologic asymptomatic.  He does  continue to smoke half-pack a day.  His other problems include  hypertension, noninsulin-dependant diabetes, and hyperlipidemia.  He has  lifestyle-limiting claudication.  Recently, he saw Dr. Tresa Endo in May 2011  and was stable at that time.  Myoview showed inferior stride and echo  showed EF in the 45% range.  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  He was  brought in for elective angiography after holding his Coumadin for 3  days and doing Lovenox bridging.   PROCEDURE DESCRIPTION:  The patient was brought to second floor of [facility] in the postabsorptive state.  He was  premedicated with p.o. Valium as well as IV fentanyl.  His right groin  was prepped and shaved in usual sterile fashion.  Xylocaine 1% was used  for local anesthesia.  A 5-French sheath was inserted in the right  femoral artery using standard Seldinger technique.  A 5-French tennis  racket catheter was used for midstream  distal abdominal aortography with  bifemoral runoff using bolus chase digital subtraction and step table  technique.  Visipaque dye was used for the entirety of the case.  Retrograde aortic pressures was monitored at the end of the case.   ANGIOGRAPHIC RESULTS:  1. Abdominal aortogram.      a.     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      b.     Normal infrarenal abdominal aortogram  2. Left lower extremity.      a.     A 60% segmental left SFA stenosis just proximal to the       previously placed stent.      b.     A 90% fairly focal stenosis at the leading edge of the       previously placed stent.      c.     Two-vessel runoff with an occluded anterior tibial.  3. Right lower extremity.      a.     An 80% segmental proximal right SFA stenosis which was  calcified fluoroscopically.      b.     Mid right SFA stent was widely patent,      c.     Two-vessel runoff with an occluded anterior tibial.   PROCEDURE DESCRIPTION:  Contralateral access was obtained with a short 5-  [place] crossover catheter and a Versacore wire.  The 5-French sheath was  then exchanged under direct fluoroscopic control for a 6-French bright  tip crossover sheath.  This was carefully advanced into the left  external iliac artery.  The Versacore wire was then advanced under  direct fluoroscopic control into the left SFA.  The patient did receive  5000 units of heparin intravenously.  The wire was then guided past the  point of maximal stenosis and through the previously placed stent with a  5 x 4 balloon.  Predilatation was performed at the leading edge of the  previously placed stent revealing waist.  Following this, a 7 x 80  Smart nitinol self-expanding stent was then deployed with about 10-mm  overlap and postdilated with a 6 x 80 balloon.  There was obviously  still a waist at the tightest area and therefore this was postdilated  with a 6 x 4 Bard Dorado noncompliant balloon resulting in  release of  the waist.  Final angiographic result with reduction of the 90% proximal  leading edge stenosis as well as 60% segmental proximal left SFA  stenosis  and 0% residual with excellent runoff.  The patient tolerated  the procedure well.  The sheath was then withdrawn across the iliac  bifurcation and the ACT was measured less than 150.  Sheath was removed  and pressure was held in the groin to achieve hemostasis.  The patient  left the lab in stable condition.  He will be gently hydrated overnight  and his labs will be checked in the morning.  Pharmacy will restart  heparin in 4 hours as well as Coumadin tonight.  He will be discharged  home with his INR greater than or equal to 2.5.  He will then be staged  for right SFA intervention using Diamondback atherectomy, PTA, and  stenting.      Nanetta Batty, M.D.      JB/MEDQ  D:  06/04/2010  T:  06/05/2010  Job:  [id number]   cc:   [facility].  [facility]  Nicki Guadalajara, M.D.  Madelin Rear. Sherwood Gambler, MD   Electronically Signed by Nanetta Batty M.D. on 06/21/2010 02:54:53 PM

## 2011-01-04 NOTE — Discharge Summary (Signed)
   NAME:  Tanner Pena, Tanner Pena                       ACCOUNT NO.:  [id number]   MEDICAL RECORD NO.:  [id number]                   PATIENT TYPE:  INP   LOCATION:  A208                                 FACILITY:  [facility]   PHYSICIAN:  Madelin Rear. Sherwood Gambler, M.D.             DATE OF BIRTH:  13-Sep-1940   DATE OF ADMISSION:  05/23/2003  DATE OF DISCHARGE:  05/26/2003                                 DISCHARGE SUMMARY   DISCHARGE DIAGNOSES:  1. Chest pain, possible new onset angina.  2. New onset diabetes mellitus type 2.  3. Known coronary artery disease.  4. Status post prostatic valve replacement.  5. Hyperlipidemia.  6. Apathic peripheral neuropathy.   DISCHARGE MEDICATIONS:  1. Coumadin 5 mg daily.  2. TriCor.  3. Neurontin.  4. HCTZ.  5. Allopurinol.  6. Toprol XL.  7. Actos.  8. Glyset.   HOSPITAL COURSE:  The patient was admitted with new onset diabetes mellitus  that was refractory with outpatient treatment and also accompanied with an  episode of chest discomfort that was nonexertional,.  Is brought in for rule  out myocardial infarction protocol as well as tighter regulation of his  sugar.  Seen in consultation by cardiology.  Serial enzymes were negative.  With oral medication his blood sugars improved and he will followup as an  outpatient with Cardiolite stress test per [facility].      ___________________________________________                                         Madelin Rear. Sherwood Gambler, M.D.   LJF/MEDQ  D:  06/13/2003  T:  06/13/2003  Job:  [id number]

## 2011-01-04 NOTE — Discharge Summary (Signed)
NAME:  Tanner Pena, Tanner Pena                       ACCOUNT NO.:  [id number]   MEDICAL RECORD NO.:  [id number]                   PATIENT TYPE:  INP   LOCATION:  2017                                 FACILITY:  [facility]   PHYSICIAN:  Nicki Guadalajara, M.D.                  DATE OF BIRTH:  06-15-1941   DATE OF ADMISSION:  06/30/2002  DATE OF DISCHARGE:  07/09/2002                                 DISCHARGE SUMMARY   DISCHARGE DIAGNOSES:  1. 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  2. Hypertension.  3. Hyperlipidemia.  4. 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  5. Gout.  6. Left bundle branch block.  7. Chronic obstructive pulmonary disease.   HISTORY OF PRESENT ILLNESS:  The patient is a 70 year old Caucasian  gentleman who was evaluated as outpatient to rule out symptoms of  claudication and was found to have abnormal lower extremity ABI's; on the  right side ABI posterior tibialis 0.81 and dorsalis pedis on the right 0.74,  on the left lower extremity posterior tibialis 1.24 and the left dorsalis  pedis was 1.17.   The patient also has a known coronary artery disease and is status post CABG  in 1990, also status post a redo CABG in 1994 and he is status post aortic  valve replacement with St. Jude valve and he is maintained on Coumadin  anticoagulation.  His history also remarkable for hypertension,  hyperlipidemia, gout and he has known left bundle branch block.   He was admitted to the hospital for Coumadin-heparin crossover in  anticipation of the lower extremities angiography.  On admission June 30, 2002 his INR was 1.3, we started him on heparin, and the next day  July 01, 2002 the patient underwent peripheral angiography.   HOSPITAL PROCEDURES:  Peripheral angiography performed by  Dr. 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  Left lower extremity showed 80% focal  mid left superficial femoral artery stenosis with two-vessel runoff and the  right lower extremity showed high-grade 90% distal right external iliac  artery stenosis and 80-90% segmental right superficial femoral artery  stenosis with two-vessel runoff.   HOSPITAL COURSE:  The patient underwent successful right external iliac and  right SFA PTA and stenting and tolerated procedure well, was to be started  on heparin with the plan to proceed with Coumadin loading and also he was  fit to be a candidate for staged PTA of the left lower extremity.   On July 04, 2002, the patient developed a productive cough with yellow  sputum and his temperature went up to 101.2 to 102.2.  Chest x-ray was  suspicious  for lung carcinoma and CT scan was ordered.   The patient underwent CT scan that showed calcified pleural plaque on the  left side, calcified granuloma on the left, but no changes to suggest  malignancy.  The patient was given a course of Zithromax with improvement of  symptoms.  Temperature on July 07, 2002 was 98.4, O2 saturations 94%  room air, but INR still remained subtherapeutic.   On July 09, 2002, the patient was ready for discharge when his INR  increased to 2.2 and his lungs were clear to auscultation without any  wheezes or rhonchi just few scattered dry crackles, especially at the bases  of the lungs, the right greater than left.  Heart revealed regular rate and  rhythm.  Extremities were with no edema and groin puncture site was without  any signs of bleeding or hematoma.   CONDITION ON DISCHARGE:  The patient was discharged home in stable  condition.   LABORATORIES:  INR was 2.2.  White blood cell count 5.5, hemoglobin 15.9,  hematocrit 41.8, platelets 225.  Sodium was 145, potassium 4.3, chloride   101, CO2 26, BUN 14, creatinine 1.1 and glucose 128.   He maintained in normal sinus rhythm.  His EKG remained showing no  significant changes.   DISCHARGE MEDICATIONS:  1. Norvasc 10 mg daily.  2. Toprol XL 75 mg daily.  3. Zyloprim 300 mg daily.  4. Tricor 160 mg daily.  5. Prinivil 40 mg daily.  6. Neurontin 300 mg daily.  7. Hydrochlorothiazide p.r.n.  8. Aspirin 81 mg daily.  9. Coumadin 5 mg daily except 7.5 mg on Tuesday and Thursday.  The patient     was instructed to take 7.5 mg of Coumadin only on the day of his     discharge and then resume the routine doses as dictated above.  10.      Robitussin cough syrup with Codeine two teaspoons at bedtime.  11.      Robitussin DM over the counter one to two teaspoons every 4 hours     as needed.   ACTIVITY:  As tolerated.   DIET:  Low-fat, low-salt diet.   WOUND CARE:  He was allowed to take shower and instructed not to wrap the  groin puncture site but pat it dry.   FOLLOW UP:  He is to go the lab for a Coumadin check on July 12, 2002.  Followup appointment with Dr. Tresa Endo scheduled on August 03, 2002 at 12  p.m. in [place].     Raymon Mutton, P.A.                    Nicki Guadalajara, M.D.    MK/MEDQ  D:  08/04/2002  T:  08/05/2002  Job:  [id number]

## 2011-01-04 NOTE — H&P (Signed)
NAME:  Tanner Pena, Tanner Pena                       ACCOUNT NO.:  [id number]   MEDICAL RECORD NO.:  [id number]                   PATIENT TYPE:  INP   LOCATION:  3738                                 FACILITY:  [facility]   PHYSICIAN:  Tanner Pena, M.D.                  DATE OF BIRTH:  1941-04-26   DATE OF ADMISSION:  12/28/2003  DATE OF DISCHARGE:                                HISTORY & PHYSICAL   HISTORY OF PRESENT ILLNESS:  Mr. Tanner Pena is a 70 year old male followed by  Dr. Tresa Pena with a history of coronary disease. He had bypass surgery in 1990  with a St. Jude aortic valve replacement. In 1994, he had redo surgery  secondary to progression of his coronary disease with LIMA to the LAD and a  vein to his circumflex. He has had peripheral vascular disease with prior  angioplasty to his iliac artery. Recently, he has been seeing Dr. Malon Pena for some right shoulder trouble. He is admitted now for Coumadin and  heparin crossover prior to shoulder surgery. He did have a cardiolite study  as an outpatient, December 05, 2003, but showed no significant ischemia with an  ejection fraction of 59%. His last dose of Coumadin was Dec 25, 2003. From a  cardiac standpoint, he has done well. He denies any chest pain or any  unusual shortness of breath.   PAST MEDICAL HISTORY:  1. Peripheral vascular disease as noted above.  2. Treated hypertension and hyperlipidemia.  3. Gout.  4. Chronic left bundle branch block.   CURRENT MEDICATIONS:  1. Zocor 40 mg a day.  2. Hyzaar 100/25 daily.  3. Actos 30 mg a day.  4. Aspirin 81 mg a day.  5. Toprol XL 50 mg a day.  6. Tricor 160 daily.  7. Coumadin as directed.  8. Neurontin 300 mg a day.   ALLERGIES:  No known drug allergies.   SOCIAL HISTORY:  He is divorced, he has five children, four grandchildren.  He is a 1/2-pack a day smoker.   FAMILY HISTORY:  Father died at 59 of an MI. Mother died at 54 of an MI. He  has no siblings.   REVIEW OF  SYSTEMS:  Essentially unremarkable except for as noted above. He  has had remote kidney stones. He has had intermittent lower extremity edema,  left greater than right.   PHYSICAL EXAMINATION:  VITAL SIGNS:  Blood pressure 140/70, pulse 70,  temperature 98.2. Weight 240.  GENERAL:  He is a well-developed, well-nourished male in no acute distress.  HEENT:  Normocephalic. Extraocular movements are intact. Sclerae are  nonicteric. Lids are conjunctivae are within normal limits.  NECK:  Without JVD, he has transmitted murmur to his carotids.  CHEST:  Essentially clear to auscultation and percussion.  CARDIAC EXAM:  Reveals regular rate and rhythm with crisp prosthetic valve  sounds.  ABDOMEN:  Soft, nontender, no hepatosplenomegaly is  appreciated.  EXTREMITIES:  Reveal a trace edema in the left lower extremity.  NEUROLOGIC:  Grossly intact. He is awake, alert, oriented, cooperative.  Moves all extremities without obvious deficit.   LABORATORY DATA:  INR on admission is 2.0.   IMPRESSION:  1. Degenerative joint disease, right shoulder, for surgery on Dec 30, 2003     by Dr. Ranell Pena.  2. History of St. Jude aortic valve replacement, last dose of Coumadin was     on Dec 25, 2003.  3. Coronary disease, coronary artery bypass grafting in 1990 with redo     surgery in 1994, cardiolite study  in April 2005 showing no significant     ischemia with good left ventricular function.  4. Non-insulin-dependent diabetes.  5. Controlled hypertension.  6. Treated hyperlipidemia.  7. Left bundle branch block.  8. Chronic obstructive pulmonary disease and history of smoking.  9. Past history of gout.   PLAN:  The patient is admitted to telemetry. Pharmacy is to start heparin.  Dr. Ranell Pena' office will be notified. Will follow the patient  perioperatively.      Abelino Derrick, P.A.                      Tanner Pena, M.D.    [name]  D:  12/29/2003  T:  12/29/2003  Job:  [id number]

## 2011-01-04 NOTE — H&P (Signed)
NAME:  Tanner Pena, Tanner Pena                         ACCOUNT NO.:  [id number]   MEDICAL RECORD NO.:  [id number]                  PATIENT TYPE:   LOCATION:                                       FACILITY:   PHYSICIAN:  Madelin Rear. Sherwood Gambler, M.D.             DATE OF BIRTH:  1940-10-18   DATE OF ADMISSION:  DATE OF DISCHARGE:                                HISTORY & PHYSICAL   CHIEF COMPLAINT:  Chest pain.   HISTORY OF PRESENT ILLNESS:  The patient has been managed in the office for  new onset diabetes mellitus.  First office visit documented blood sugar on  May 19, 2003, was 548.  He had urinary tract infection at that time,  treated with antibiotics and was given a covering dose of insulin.  He was  started on Avandia with adverse reaction, consisting of hypotension into the  90's and sometimes 70's with orthostatic symptomatology as well as failure  of the blood pressure to reduce much below 400.  In the office today, he  presents with a blood sugar of 348, seven hours postprandially.  He did  admit to some on and off chest discomfort which was vague and very light in  nature.  He is currently having no pain.  He also admitted to some shortness  of breath.   PAST MEDICAL HISTORY:  1. Known coronary artery disease, status post coronary artery bypass     grafting and catheterization, November 2003 by Dr. Tresa Endo.  2. Status post aortic valve prosthesis.  3. Previous hemorrhoids and gastrointestinal bleed.  4. Hypertension maintained on calcium channel blocker, ACE inhibitor and     beta blocker.  5. Peripheral neuropathy, treated with Neurontin.  6. Hyperlipidemia, treated with Pravachol.   SOCIAL HISTORY:  Positive for cigarette smoking, occasional alcohol use.   FAMILY HISTORY:  Positive for coronary artery disease.  Grandparents  deceased of a stroke.  Children all in good health.   REVIEW OF SYSTEMS:  As under History of Present Illness.  He also admitted  to blurred vision  since the new diagnosis of diabetes.   PHYSICAL EXAMINATION:  GENERAL:  He has a ruddy complexion but he is in no  acute distress.  He is awake, alert.  VITAL SIGNS:  Blood pressure 100/68, pulse in the 60's.  HEAD/NECK:  No JVD or adenopathy.  Neck was supple.  CHEST:  Scattered rhonchi.  CARDIAC:  Regular rhythm with no gallop or rub.  ABDOMEN:  Soft, no organomegaly or masses.  EXTREMITIES:  Without clubbing, cyanosis, edema.  NEUROLOGIC:  Nonfocal.   STUDIES:  Electrocardiogram was obtained in the office with no previous ECG  for comparison which reveals normal sinus rhythm at rate of approximately  65, intraventricular conduction delay with repolarization changes, Q-waves  in 3 and F. He had rather impressive ST depression in V5 and V6.  Again,  possibly repolarization changes but unknown age.  IMPRESSION:  1. New onset diabetes mellitus, poor control or oral hyperglycemic agents.     He will be admitted for sliding scale insulin coverage and attempt at     dropping the blood sugar with oral hyperglycemics other than the Avandia     which he had difficulty with.  2. Atypical chest pain with unknown significance EKG changes.  The patient     will be admitted for rule out silent myocardial infarction, a known cause     of persistent hyperglycemia in diabetics.  3. Blurred vision, presumably lens edema secondary to hyperosmolality.     Ophthalmology consultation if indicated.  4. Hypotension.  We will hold his antihypertensive medication consisting of     calcium channel blocker and Iodopin as well as ACE inhibitor Prinivil     with p.r.n. coverage as needed.  5. Hyperlipidemia, stable at present.  Monitor expectant observation.   PLAN:  Serial cardiac enzymes will be obtained.  A cardiology consultation  obtained.     ___________________________________________                                         Madelin Rear. Sherwood Gambler, M.D.   LJF/MEDQ  D:  05/23/2003  T:  05/23/2003  Job:   [id number]

## 2011-01-04 NOTE — Op Note (Signed)
NAME:  Tanner Pena, Tanner Pena             ACCOUNT NO.:  [id number]   MEDICAL RECORD NO.:  [id number]          PATIENT TYPE:  INP   LOCATION:  2001                         FACILITY:  [facility]   PHYSICIAN:  Demian A. Alanda Amass, M.D.DATE OF BIRTH:  April 19, 1941   DATE OF PROCEDURE:  DATE OF DISCHARGE:                                 OPERATIVE REPORT   PV ANGIOGRAM AND PPI   PROCEDURE:  Retrograde abdominal aortic catheterization, abdominal aortic  angiogram mid-stream PA projection, DSA, bilateral iliac angiography, mid-  stream PA projection, bilateral lower extremity runoff using bolus-chase  technique and DSA, selective left superficial femoral artery angiography  using DSA, left superficial femoral artery PTA and subsequent Nitinol self-  expanding 7 x 100 mm Cordis SMART stent with post-deployment dilatation with  6 mm x 8 mm Cordis OPTA balloon, selective left renal angiogram, PA  projection and left renal artery lesion gradient measurement.  Weight-  adjusted heparin 4500 units, extra Plavix 150 mg, Nubain 6 mg IV in divided  doses for sedation, 1% local Xylocaine at entrance.   COMPLICATIONS:  None.   ESTIMATED BLOOD LOSS:  Approximately 20 cubic centimeters.   ACCESS SITE:  CRFA, 5 [place] system upgraded to 6 [place] Terumo cross-over  sheath.   PROCEDURE:  The patient was brought to the sixth floor PV lab in a  postabsorptive state after 5 mg of Valium p.o. premedication.  Coumadin had  been on hold.  INR in the a.m. of the procedure was 1.6, creatinine 1.0.  The right groin was prepped, draped in the usual manner.  The RCFA was  entered with an anterior puncture using an 18 thin-walled needle and a 5  French short Cordis side arm sheath was inserted without difficulty.  A  Wholey wire was used for exchange and to negotiate the right iliac system.  Abdominal aortic angiogram was done in the midstream PA projection at 20  cubic centimeters, 20 cubic centimeters per second above the  level of the  renal artery.  Bilateral iliac angiography was done at 15 cubic centimeters  20 cubic centimeters per second above the iliac bifurcation.  Bilateral  lower extremity runoff was done at 88 cubic centimeters, 8 cubic centimeters  per second through the 5 French pigtail catheter with visualization to the  feet bilaterally.   Selective right iliac angiography was done through the side arm sheath in  the oblique projections.  There was a previously placed stent in this area.  The left common iliac was then accessed with an IMA 5 French catheter and,  using an angled glide wire, the left iliac system was crossed.  The side arm  sheath was then exchanged for a 6 French cross-over Terumo sheath which was  positioned in the LEIA, dilator removed.  The glide wire was used to access  the LSFA with a short right coronary catheter for positioning.  The right  coronary catheter was removed and the glide wire was used under fluoroscopic  control across the segmental left SFA stenosis and into the left popliteal.   PRESSURES:  Arterial pressures were monitored throughout the procedure,  range 140 to 150 mmHg.  The patient remained in sinus rhythm throughout the  procedure and had no arrhythmias.   Abdominal angiogram in the mid-stream PA projection demonstrated widely  patent proximal SMA celiac access.  The IMA was intact.  The abdomen had  mild infrarenal atherosclerotic disease with some lateral calcification.  There was no aneurysm or stenosis.  The right renal artery was single and  had 20% narrowing or less.  The left renal artery was hypodense.  Selective  left renal angiography done after the PPI showed approximately 50%  concentric calcific narrowing of the proximal LRA with good flow.  The left  renal was below the right in position.   The common iliacs had irregularity and diffuse 30% narrowing bilaterally  with good residual lumen.  Calcification was visible.   The  hypogastrics were intact bilaterally.  The left external iliac had no  significant stenosis with about 20% narrowing before the LCFA.  The LCFA had  no significant stenosis.   The REIA within the prior stent had 40 to 50% narrowing in the distal third  of the stent but was smooth, and residual lumen was felt to be good.  There  was 20 to 30% narrowing of the RCFA.  The proximal right SFA had 30 to 40%  narrowing with good lumen.  The previously placed mid to distal right SFA  stent appeared to have focal 70 to 80% narrowing in the proximal portion of  the stent and just proximal to it.  The remainder of the stent had about 40%  segmental narrowing.  There was good flow to the right popliteal with  irregularities but no significant stenosis.  The right anterior tibial was  totally occluded which is old.  It appears that there was flow to the right  perineal and PT, but these were not well visualized in this study.   The left SFA profunda junction was intact.  The left SFA had 40% calcific  narrowing at its ostial proximal portion concentrically but good residual  lumen.  The left profunda had eccentric 40% narrowing with good flow.   The left SFA had an eccentric 40% lesion in the proximal third that was  smooth.  There was diffuse disease from the mid portion past Hunter's canal.  In the distal third of the left SFA, there was 60% proximally, 90 to 95%  just superior to Hunter's canal, and 60% segmentally beyond this.  This was  about a 6 to 8 cm segmental area of stenosis.   The left popliteal had 20% narrowing with no significant stenosis.  The LAT  was totally occluded.  The LPT was patent.  The peroneal showed flow to the  ankle and foot, and the LPT was patent with tandem 70 and 80% lesions.   It was elected to proceed with intervention in this setting.  The patient  was given 3500 units of heparin, an extra 150 of Plavix (on Plavix at home). ACTs were monitored, and he was given  another 1000 units of heparin towards  the end of the procedure.   The segmental left SFA lesion was initially crossed with a 0.035 inch glide  wire.  A 5 mm x 4 cm Cordis PowerFlex balloon was then used to dilate this  tandem area at 10-30 and 14-30.  There was moderate calcification  circumferentially.  The patient did have discomfort with balloon dilatation  and post stent dilatation.   Balloon was then exchanged for  a 7 mm x 100 cm x 100 mm Cordis Nitinol self-  expanding SMART stent.  This was positioned fluoroscopically and deployed.  The deployment system was removed and the stent was post dilated with a 6 mm  x 8 cm Cordis OPTA balloon at 12-30 and 12-30.   The stented area was reduced to less than 10% residual narrowing . There was  40% narrowing proximal to the stent.  Segmentally, that was unchanged, there  was no dissection, and there was good flow.   The side arm sheath was pulled back across the iliac bifurcation.  Selective  left renal angiography was done with a short right coronary catheter showing  40 to 50% narrowing of the LRA.  Catheter was removed and the sheath was  changed for a short 6 French left side arm sheath.  The patient tolerated  the procedure well and was transferred to the holding area in stable  condition.   BRIEF HISTORY:  Mr. Eudy is a 70 year old white married father of five  with seven grandchildren, retired and disabled because of his heart disease,  separated and remarried several times.  Remote tobacco abuse.  Quit  completely three to four years ago.  He has a long history of cardiovascular  disease, has AODM, hyperlipidemia.  He had CABG plus AVR and SVG to the  right in 1990.  He had redo CABG with LIMA to the LAD and SVG to the  circumflex in 1994.  He had SVG to the RCA stenting 10/05 complicated by  perforation which was sealed focally and the patient did well after that.  He had peripheral arterial disease and underwent right SFA,  SMART Nitinol  stenting in the mid portion and right external iliac, Genesis balloon  expandable stenting on 07/01/02 by Dr. Allyson Sabal.   He presents now with pain in the left lateral foot area that is brought on  by exertion.  On exam, there appears to be swelling in the left fifth MTP  area and the patient does have a prior history of gout, and this may be part  of his problem.  His pain is, however, exertional suggesting PAD and  Fontaine 2B claudication.  He is not having any significant right lower  extremity claudication.   It was felt best to proceed with intervention at this time.  He may be  having combined problems in his left lateral foot and fifth MTP area which  will be further evaluated.  He has had successful PTA and stenting of high-  grade segmental mid left SFA disease as outlined.  He has mild smooth  restenosis of right external iliac stent, and he does have focal restenosis of the proximal right SFA stent with bilateral anterior tibial occlusion and  patent peroneal and posterior tibials bilaterally.  Based on this patient's  clinical response and results of uric acid, he may need joint aspiration if  more definitive diagnosis needs to be made and/or CT scanning or bone  scanning if this is not inflammatory to rule out any deep ischemic problem  although there are no ulcerations.  He needs to be continued on vigorous  lipid-lowering therapy.  I would recommend continued Plavix for his  peripheral vascular disease and re-institution of Coumadin for his aortic  valve which he is apparently tolerating.  Follow-up of surveillance Dopplers  are also recommended along with clinical follow-up.   Renal Doppler follow-up would also be recommended.   CATHETERIZATION DIAGNOSES:  1.  Atherosclerotic  peripheral vascular disease - pain left lateral foot and      left fifth metatarsophalangeal pain on minimal exertion compatible with      ischemia.  Rule out other associated causes  such as gout (lab pending).  2.  Remote right superficial femoral artery tandem Nitinol stenting, patent      on this study with in-stent restenosis and proximal stent renarrowing      without current significant symptoms.  3.  Patent right external iliac stent, July 01, 2002, 50% smooth      narrowing, good residual lumen.  4.  Successful Nitinol self-expanding stent, diffuse left mid to distal      superficial femoral artery disease.  5.  Bilateral posterior tibial occlusion.  6.  Adult-onset diabetes mellitus.  7.  Hyperlipidemia.  8.  Remote St. Jude aortic valve replacement and right coronary artery      saphenous vein graft in 1990.  9.  Re-do coronary artery bypass graft with left internal mammary artery to      left anterior descending and saphenous vein graft to circumflex in 1994.  10. Saphenous vein graft to right coronary artery stent, October 2005.  11. Remote smoker.  12. History of gout.      RAW/MEDQ  D:  10/18/2004  T:  10/18/2004  Job:  [id number]

## 2011-01-04 NOTE — Cardiovascular Report (Signed)
NAME:  Tanner Pena, Tanner Pena NO.:  [id number]   MEDICAL RECORD NO.:  [id number]          PATIENT TYPE:  INP   LOCATION:  2399                         FACILITY:  [facility]   PHYSICIAN:  Thereasa Solo. Little, M.D. DATE OF BIRTH:  27-Sep-1940   DATE OF PROCEDURE:  06/11/2004  DATE OF DISCHARGE:                              CARDIAC CATHETERIZATION   PROCEDURE:  Cardiac catheterization.   INDICATION:  This 70 year old male was admitted on June 07, 2004 with  unstable angina.  He has a St. Jude aortic valve in and because of his  anticoagulation issues, was maintained on heparin, Coumadin discontinued and  brought to the cath lab for evaluation.   His past cardiac history includes aortic valve replacement and saphenous  vein graft to the RCA in 1990 with redo bypass surgery in 1994 which  included left internal mammary artery to the LAD and a saphenous vein graft  sequentially to OM #1, 2, 3, and to the distal circumflex.   Because of his St. Jude aortic valve, the aortic valve was not crossed.   DESCRIPTION OF PROCEDURE:  After obtaining informed consent, the patient was  prepped and draped in the usual sterile fashion, exposing the left groin.  After applying local anesthetic with 1% Xylocaine, the Seldinger technique  was employed and a 5-French introducer sheath was placed into the right  femoral artery.  Coronary arteriography and graft visualization was  performed.  Following this, a complex intervention to the saphenous vein  graft was performed.   COMPLICATIONS:  Perforation of saphenous vein graft to the RCA closed with a  perfusion balloon.   EQUIPMENT:  Diagnostic catheters -- 5-French -- with internal mammary artery  being cannulated with the right coronary catheter.   INTERVENTIONAL EQUIPMENT:  See below.   TOTAL CONTRAST:  245 mL.   HEMODYNAMIC MONITORING:  Blood pressure during the cardiac catheterization  was 180/85 with heart rates ranging from 60-90.   At no point was the patient  hemodynamically compromised, bradycardic or unstable.   1.  Left main:  Normal.  2.  Circumflex:  The circumflex was 100% occluded in its midportion (all the      OMs were grafted).  There was proximal and ostial narrowing of the      native circumflex of about 80%.  3.  LAD:  The LAD appeared to be occluded after the first diagonal with some      faint bidirectional flow distal to this.  The diagonal itself was free      of disease, but there were proximal areas of 40% and 50% narrowing in      the LAD proper.  4.  Right coronary artery:  Ostially occluded.  5.  Saphenous vein graft sequentially to OM-1, -2, -3 and circumflex.  The      graft was widely patent.  The circumflex and OM vessels were widely      patent.  6.  Saphenous vein graft to the RCA:  This graft had a long area of      narrowing that started just past the ostium and extended  into the      proximal segment about 20 mm+ in length.  It was 95% at its worst area.      There was TIMI-2 flow distal to this.  The distal graft appeared to be      free of disease, as was the RCA.  7.  Left internal mammary artery to the LAD:  The internal mammary artery      was widely patent.  The LAD itself  crossed the apex and was free of      significant disease.   Because of the high-grade stenosis in this 1990 bypass graft, arrangements  were made for percutaneous intervention.  The patient was given 5700 units  of intravenous heparin and had an ACT prior to starting the intervention of  240.  He was started on double-bolus Integrilin and an Integrilin drip.   A right coronary artery bypass catheter with sideholes, 6-French, was used  as a guide catheter.  A short luge wire was placed down the saphenous vein  graft well into the RCA.  A PowerSail 2.0 x 13.0 balloon was placed in the  area of the most obstructions and 2 inflations, 8 x 40 and 10 x 51, were  performed.  The area that had been 95% narrowed  initially now was about 60%  narrowed, but there was TIMI-3 flow and no dissection, and no evidence of  extravasation.   A 3.0 x 13.0 TAXUS stent was then placed in the vessel.  It extended from  the ostium, well past the end of the obstruction.  The initial inflation was  14 atmospheres for 60 seconds, with the final inflation being 16 atmospheres  for 58 seconds.  There was minor narrowing in the midportion of the stent of  less than 20% to 25%.  There was brisk TIMI-3 flow.  There was no evidence  of any distal embolization, however, there was faint blushing of contrast  outside the stent in the midportion; this was the same area where the most  significant obstruction was.  This was consistent with a perforation of the  vessel within the area of the of the stent.   The patient remained hemodynamically stable with a blood pressure of 180/85  and heart rate of 90.  A Quantum Maverick 3.2 x 20.0 was placed in the area  of the perforation and a single inflation of 7 atmospheres for 30 seconds  was performed.   A Viva balloon 3.0 x 4.0 (perfusion balloon) was made ready.  It was placed  through the entire length of the stent, inflated to 10 atmospheres and  maintained this inflation time for 15 minutes.  It appeared that the  perforation had closed.  I waited approximately 30 minutes, took another  look and there appeared to be some trivial extravasation.  Because of this,  the Viva balloon was reinflated a second time, 12 atmospheres for 14.5  minutes.  Twenty minutes after this inflation, there appears to be no  evidence of the perforation.  There is clearly no extravasation of contrast  outside the vessel.   During the inflation times listed above, an emergency 2-D echocardiogram was  performed that showed no evidence of any pericardial effusion and/or  tamponade.   Dr. Evelene Croon from [facility] came and evaluated the patient and felt that he was stable, and the extravasation looked  like it was more into the  adventitia.  He plans to follow the patient with [place].  I discontinued his Integrilin.  His ACT at the end of the procedure was 190  and I gave him an additional 1000 units of heparin with plans to remove the  sheath in the next 2 hours.  He was given 300 mg of Plavix p.o. because of  the drug-eluting stent.   His sheath should be removed later today, his hemoglobin will be watched  carefully and he will be transferred to CCU.  At no time during the  extravasation/perforation did the patient complain of chest pain or was he  hemodynamically unstable.       ABL/MEDQ  D:  06/11/2004  T:  06/11/2004  Job:  [id number]   cc:   Nicki Guadalajara, M.D.  [phone] [address]  [place], [place] [address]  Fax: [phone]   [facility]

## 2011-01-04 NOTE — Cardiovascular Report (Signed)
NAME:  Tanner Pena, Tanner Pena                       ACCOUNT NO.:  [id number]   MEDICAL RECORD NO.:  [id number]                   PATIENT TYPE:  INP   LOCATION:  2017                                 FACILITY:  [facility]   PHYSICIAN:  Nanetta Batty, M.D.                DATE OF BIRTH:  1940-09-03   DATE OF PROCEDURE:  06/30/2002  DATE OF DISCHARGE:                              CARDIAC CATHETERIZATION   PROCEDURE PERFORMED:  Peripheral angiogram/percutaneous transluminal  coronary angioplasty and stent.   CARDIOLOGIST:  Nanetta Batty, M.D.   INDICATION:  The patient is a 70 year old moderately overweight white male  with a history of CAD, status post CABG in 1990, with redo in 1994.  He has  had multiple interventions.  He has had an aortic valve replaced and is on  Coumadin anticoagulation.  He continues to smoke half a pack per day and has  a history of hypertension and hyperlipidemia.  He complains of claudication  with Dopplers revealing a diminished right ABI.  He was admitted November  12, for heparin crossover and presents now for angiography with potential  intervention.   PREPARATION FOR PROCEDURE:  The patient was brought to the sixth floor [facility] in the postabsorptive state.  He  was premedicated with p.o. Valium.  His left groin was prepped and shaved in  the usual sterile fashion.  Then 1% Xylocaine was used for local anesthesia.   A 5-French sheath was inserted into the right femoral artery using standard  Seldinger technique.  A 5-French tennis-racquet catheter, IMA catheters were  used for mid stream, distal abdominal aortography and bifemoral runoff.  Visipaque dye was used for the entirety of the case.  Retrograde and aortic  pressures were monitored during the case.   ANGIOGRAPHIC RESULTS:  1. Abdominal aorta:     A. Renal arteries:  Stenosis 50% left.     B. Infrarenal abdominal aorta:  Mild atherosclerosis.  2. Left lower  extremity:     A. Focal mid left SFA 80% with two-vessel runoff.  3. Right lower extremity:     A. Distal right external iliac stenosis 90%.     B. Segmental mid right SFA 80% with two-vessel runoff.   DESCRIPTION OF PROCEDURE:  The patient received 2500 units of heparin  intravenously.  Contralateral access was obtained with a short IMA catheter  and 0.035 Wholey wire, and a 7-French crossover Balkan sheath.  PTA was  performed of the right external and iliac artery and SFA.  Stenting was  performed of the right SFA using overlapping 7 x 4 and 7 x 8 SMART Stents.  Postdilatation was performed with a 6 x 4 PowerFlex nominal pressures.   PTA was performed of the right external iliac using a 6 x 2 PowerFlex and  stenting using a 7 x 24 Genesis and __________ premount at 10 atmospheres.  Final angiographic result was  reduction of an 80% segmental mid right SFA to  0% residual and 90% distal right external iliac to less than 10% residual  without dissection.   The patient tolerated the procedure well.  There are no hemodynamic or  electrocardiographic sequelae.  An ACT was measured and the sheaths were  removed.  Pressure was held on the groin to achieve hemostasis.  The patient  left the lab in stable condition.  He will be reheparinized in three hours  and started back on Coumadin tomorrow. He will be discharged home when his  INR is 2.5 because of his aortic valve.  Dr. Artis Delay is notified of  these results. The patient left the lab in stable condition.                                               Nanetta Batty, M.D.    Cordelia Pen  D:  07/01/2002  T:  07/02/2002  Job:  [id number]   cc:   Nicki Guadalajara, M.D.  [phone] [address]  [place], [place] [address]  Fax: [phone]   Peripheral V[facility]   Madelin Rear. Sherwood Gambler, M.D.  [address]  [place]  [place] [address]  Fax: [phone]   [facility]

## 2011-01-04 NOTE — Op Note (Signed)
NAME:  Tanner Pena, Tanner Pena             ACCOUNT NO.:  [id number]   MEDICAL RECORD NO.:  [id number]          PATIENT TYPE:  INP   LOCATION:                               FACILITY:  [facility]   PHYSICIAN:  Janetta Hora. Fields, MD  DATE OF BIRTH:  Dec 30, 1940   DATE OF PROCEDURE:  01/08/2005  DATE OF DISCHARGE:                                 OPERATIVE REPORT   PROCEDURE:  Aortogram with bilateral lower extremity runoff.   PREOPERATIVE DIAGNOSIS:  Tissue loss left foot.   POSTOPERATIVE DIAGNOSIS:  Tissue loss left foot.   ANESTHESIA:  Local.   OPERATIVE FINDINGS:  1.  Right groin puncture 5-French sheath.  2.  Severe distal tibial artery occlusive disease.  3.  Occlusion of anterior tibial artery mid leg.  4.  Peroneal and posterior tibial artery occluded at the ankle.   OPERATIVE DETAIL:  After obtaining informed consent, the patient was taken  to the [facility] lab. Patient placed in the supine position on the angio table.  Next, both groins were prepped and draped usual sterile fashion. Local  anesthesia infiltrated was over the right common femoral artery. A Justin  needle was then used to cannulate the right common femoral artery.   A 0.35 J-tipped guidewire was then threaded into the abdominal aorta under  fluoroscopic guidance. A 5-French sheath was then placed over the guidewire  in right femoral artery. A 5-French pigtail catheter was then placed over  the guidewire in the abdominal aorta.  An abdominal aortogram was obtained  for lower extremity runoff. The abdominal aorta and aortoiliac and femoral  artery segments had mild-to-moderate atherosclerotic changes. The patient  had severe distal tibial artery disease with the anterior tibial artery  occluding the mid leg on the left foot. The peroneal and posterior tibial  arteries occluded at the ankle. No target vessel for revascularization.   Next, the pigtail catheter was removed over a guidewire.  The 5-French  sheath was then removed.   Hemostasis obtained with direct pressure. The  patient tolerated the procedure well. There were no complications. The  patient was taken to the recovery in stable condition.           ______________________________  Janetta Hora Fields, MD     CEF/MEDQ  D:  03/20/2005  T:  03/21/2005  Job:  [id number]

## 2011-01-04 NOTE — Discharge Summary (Signed)
NAME:  Tanner Pena, Tanner Pena                       ACCOUNT NO.:  [id number]   MEDICAL RECORD NO.:  [id number]                   PATIENT TYPE:  INP   LOCATION:  3738                                 FACILITY:  [facility]   PHYSICIAN:  Tanner Pena, M.D.                  DATE OF BIRTH:  [birth year]   DATE OF ADMISSION:  12/28/2003  DATE OF DISCHARGE:  01/03/2004                                 DISCHARGE SUMMARY   Tanner Pena is a 70 year old white, married, male, patient of Dr. Nicki Pena, who has a history of coronary artery disease.  In 1990, he had a  bypass surgery and a St. Jude aortic valve replacement.  He was seen in our  office on Dec 28, 2003.  He was needing surgery, thus our service planned to  admit him for anticoagulation secondary to his St. Jude aortic valve and the  need to hold Coumadin prior to surgery.  Thus, he came into the hospital, on  Dec 28, 2003.  His INR, at that time, was 2.  His heparin was started.  He  was placed on IV heparin.  On Dec 30, 2003, he underwent arthroscopy of his  right shoulder.  He had extensive debridement.  Please see Dr. Almedia Balls.  Ranell Patrick' surgical note for complete details.  The day of surgery, he was  placed back on his Coumadin.  His heparin drip was resumed several hours  post surgery.  He then started receiving Coumadin.  He was seen by physical  therapy.  They suggested no further acute PT needs.  They recommended  outpatient PT for continued rehab.  They also suggested OT consult.  Dr.  Ranell Patrick' service continued to see the patient.  They wanted him to follow up  in the office in 7-10 days after discharge.  On Jan 03, 2004, his INR was  2.0.  He was given his Coumadin in the morning.  His heparin was continued  until about 2 in the afternoon, and it was planned that he would be  discharged between 3-4 p.m. on Jan 03, 2004.  Dr. Ranell Patrick' service did give  him prescriptions for Percocet and Robaxin.   DISCHARGE MEDICATIONS:  1. Hyzaar  100/25 once per day.  2. Zocor 40 mg at bedtime.  3. Actos 30 mg once a day.  4. Toprol XL 50 mg once per day.  5. TriCor 145 mg once per day.  6. Neurontin 300 mg at bedtime.  7. Aspirin 81 mg once per day.  8. Coumadin 7.5 mg once per day.   ACTIVITY:  As per the orthopedic M.D. recommendations.   DIET:  Low fat.   He would be able to change his dressing to Band-Aid on the Sunday following  discharge.   He should be seen in Dr. Ranell Patrick' office in 7-10 days.  They will followup  with a PT and any OT needs as an  outpatient.  He will followup with Dr.  Tresa Endo as previously planned.  He will have his pro time checked on Thursday,  Jan 05, 2004.   DISCHARGE DIAGNOSES:  1. Status post arthroscopy of his right shoulder.  2. Anticoagulation needs secondary to St. Jude aortic valve replacement.     His Coumadin was held for several days before admission.  Then he was     placed on heparin on the day of admission.  He underwent surgery on the     13th.  His Coumadin and heparin were restarted the same day.  He was near     therapeutic on Jan 03, 2004 and able to be discharged home.  3. Hypertension, controlled.  4. Hyperlipidemia within goal.  5. Chronic left bundle branch block.  6. Atherosclerotic cardiovascular disease with a history of bypass in 1990,     and a St. Jude aortic valve replacement.  He did have redo coronary     bypass grafting in 1994.  7. Peripheral vascular disease with prior angioplasty to his iliac artery.      Lezlie Octave, N.P.                        Tanner Pena, M.D.    BB/MEDQ  D:  01/03/2004  T:  01/03/2004  Job:  [id number]   cc:   Almedia Balls. Ranell Patrick, M.D.  [facility]  [address]  [address]  [place]  [place] [address]  Fax: [phone]

## 2011-01-04 NOTE — Discharge Summary (Signed)
NAME:  Tanner Pena, Tanner Pena NO.:  [id number]   MEDICAL RECORD NO.:  [id number]          PATIENT TYPE:  INP   LOCATION:  2001                         FACILITY:  [facility]   PHYSICIAN:  Nanetta Batty, M.D.   DATE OF BIRTH:  1941/02/16   DATE OF ADMISSION:  10/17/2004  DATE OF DISCHARGE:  10/23/2004                                 DISCHARGE SUMMARY   Tanner Pena is a 70 year old white separated male patient of Dr. Nicki Guadalajara and Dr. Nanetta Batty who has combined coronary artery disease and  peripheral vascular disease.  He came into the hospital secondary to  increased claudication and skin breakdown.  He was sent for repeat lower  extremity Dopplers.  He had an ABI on the left of 0.13, inadequate for  healing.  Thus, he came in the hospital for heparin-to-Coumadin crossover,  repeat PV angiogram and possible intervention.  He does have a St. Jude  aortic valve replacement.  He underwent PV angiogram by Dr. Susa Griffins in Dr. Clayborne Dana absence, and he was found to have diffuse  left mid to distal SFA disease.  He underwent stenting to his left SFA, 7 x  10 mm, with a nitinol self-expanding stent.  On his right, his stents were  patent.  He developed a hematoma post procedure.  He had Dopplers performed.  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  They tried to compress it manually, and it  was unsuccessful.  He then underwent injection with thrombin by Dr. Yates Decamp.  The following morning, he had a re-Doppler, and it was thrombosed.  He then was placed back on heparin-to-Coumadin.  He received 10 mg of  Coumadin for three days, and his INR on October 23, 2004, was 1.9.  His heparin  was DC'd.  He was given 1 dose of subcu Lovenox in the hospital, and the  plans are that he will take another dose this p.m. at home.   CURRENT MEDICATIONS:  1.  Allopurinol 300 mg 1 time per day.  2.  Toprol XL  50 mg 1 time per day.  If his blood pressure is consistently      greater than 130/80, then he will increase his Toprol XL 100 mg 1 time      per day, as he was on prior to his hospitalization.  His blood pressure      has been low here.  Thus, we decreased his dose.  3.  K-Dur 20 mEq 1 time per day.  4.  Plavix 75 mg 1 time per day.  5.  TriCor 145 mg at supper 1 time per day.  6.  Lipitor 20 mg 1 time per day.  7.  Protonix 40 mg 1 time per day.  8.  Neurontin 300 mg 1 time per day.  9.  Amaryl 4 mg 1 time per day.  10. Coumadin 7.5 mg on Monday and Friday, 5 mg all other days.  However,      this Wednesday, he should take 7.5  mg.  11. Hyzaar 100/25 mg 1 time per day.   Activity is as tolerated.  He should be on a low-saturated-fat diet.  He  will have his pro time checked on Thursday this week.  Our office will call  him for appointments for lower extremity Dopplers and for followup with Dr.  Allyson Sabal.   DISCHARGE DIAGNOSES:  1.  Critical ischemia of his left lower extremity, with ABI of 0.13,      inadequate for healing, with skin breakdown.  Thus, he was brought in      the hospital for heparin-to-Coumadin crossover secondary to his St. Jude      AVR and for PV angio.  2.  Atherosclerotic peripheral vascular disease, progressive with left      superficial femoral artery disease, undergoing stenting by Dr. Susa Griffins in Dr. Clayborne Dana absence.  3.  Post procedure, he had a pseudoaneurysm with failed manual compression.      Thus, he had a thrombin injection.  4.  Anticoagulation.  He had heparin-to-Coumadin crossover.  At discharge,      his INR is 1.9.  He received 10 mg of Coumadin for 3 days, then he will      be on his regular dose after Thursday.  He will have his pro time      checked on Thursday.  5.  Coronary artery disease.  6.  History of hypertension.  7.  History of gout.  8.  Non-insulin-dependent diabetes mellitus.  9.  Peripheral neuropathy.   10. Nonsustained ventricular tachycardia in the hospital, with low potassium      level.   LABORATORY:  CBC:  WBC 9.8, hemoglobin 13.6, hematocrit 39.4.  INR on  discharge 1.9.  Sodium 137, potassium 3.7, glucose 142, BUN 17, creatinine  1.2.  AST 24, ALT 22.  Hemoglobin A1c 6.9.  Total cholesterol 117,  triglycerides 268, HDL 27, LDL 36.  TSH 3.417.      BB/MEDQ  D:  10/23/2004  T:  10/23/2004  Job:  [id number]   cc:   Nicki Guadalajara, M.D.  [phone] [address], [address]  [place], [place] [address]  Fax: [phone]

## 2011-01-04 NOTE — H&P (Signed)
NAME:  Tanner Pena, Tanner Pena             ACCOUNT NO.:  [id number]   MEDICAL RECORD NO.:  [id number]          PATIENT TYPE:  INP   LOCATION:  5706                         FACILITY:  [facility]   PHYSICIAN:  Janetta Hora. Fields, MD  DATE OF BIRTH:  23-Nov-1940   DATE OF ADMISSION:  01/07/2005  DATE OF DISCHARGE:                                HISTORY & PHYSICAL   CHIEF COMPLAINT:  Toe ulcer.   HISTORY OF PRESENT ILLNESS:  The patient is a 70 year old white male with  history of peripheral vascular occlusive disease which has in the past been  followed by Dr. Tresa Endo and Dr. Allyson Sabal.  He has undergone previous stents to  his right external iliac and his superficial femoral arteries bilaterally.  He continues to complain of claudication symptoms with ambulating sometimes  only short distances.  He has also developed over the past two months a left  great toe ulcer.  He has been followed at the foot clinic during this time  and has been treated with p.o. antibiotics, local wound care, and most  recently Silvadene dressing changes without success.  He was referred by Dr.  Leeanne Deed at the foot clinic to Dr. Darrick Penna and was seen on Jan 04, 2005.  He  underwent ankle brachial indices in our office which showed 0.81 on the  right with heavy calcification and the left side with noncompressible  secondary calcification.  He had no palpable pulses in either foot.  Dr.  Darrick Penna recommended proceeding with an arteriogram at this time in order to  further evaluate his anatomy for possible surgical revascularization to help  with healing of the toe.  He denies any fevers or chills with the foot ulcer  and presently is having no drainage.  He has had some erythema and  tenderness around the toe and the forefoot.  He denies any rest pain, lower  extremity edema, hip, buttock, or thigh pain, or night  pain.  Of note the  patient is on chronic Coumadin therapy for mechanical St. Jude mitral valve  and will be admitted today,  Jan 07, 2005, to start heparin and to  discontinue his Coumadin in preparation for his arteriogram.   PAST MEDICAL HISTORY:  1.  Peripheral vascular disease.  2.  Coronary artery disease.  3.  Hypertension.  4.  Gout.  5.  Type 2 diabetes mellitus, noninsulin dependent.  6.  Hyperlipidemia.  7.  Peripheral neuropathy.  8.  History of a right femoral pseudoaneurysm following his stent placement      in March 2006.  9.  History of left femoral pseudoaneurysm following a PTA in 2003 (both of      these were repaired with thrombin injections).   PAST SURGICAL HISTORY:  1.  CABG/aortic valve replacement with St. Jude aortic valve in 1990.  2.  Redo coronary artery bypass grafting in 1994.  3.  Back surgery x2.  4.  Tonsillectomy.  5.  Left SFA stent in March of 2006 by Dr. Alanda Amass.  6.  Right SFA and external iliac artery stent in November of 2003 by Dr.  Allyson Sabal.   CURRENT MEDICATIONS:  1.  Allopurinol 300 mg daily.  2.  Toprol XL 100 mg daily.  3.  Hyzaar 100/25 daily.  4.  Zocor 40 mg daily.  5.  Amaryl 4 mg daily.  6.  Lasix 40 mg daily.  7.  Potassium 20 mEq daily.  8.  Coumadin 7.5 mg daily except for 5 mg on Monday, Wednesday, and Friday,      last taken on Jan 04, 2005.  9.  Tricor 14 mg daily.  10. Neurontin 300 mg daily.  11. Plavix 75 mg daily.   ALLERGIES:  No known drug allergies.   SOCIAL HISTORY:  He is single and resides alone in [place].  He has six  children.  He previously smoked 1-2 packs of cigarettes per day x40+ years  and quit four years ago.  He consumes alcohol occasionally.  He is retired  from [employer]ologist, prison and probation services business.   FAMILY HISTORY:  His mother died at age 27 of coronary artery disease.  She  also had cancer.  His father died at age 73 of a myocardial infarction.  He  also had diabetes mellitus and hypertension.  He has no siblings.  There is  a strong family history on his father's side of coronary artery disease and   hypertension.   REVIEW OF SYSTEMS:  See history of present illness for pertinent positives  and negatives.  Also he does report occasional chest pain.  He has some  hearing loss and has a hearing aid which he wears infrequently.  He wears  glasses and complains that his vision has been worse over the last two years  since his diagnosis of diabetes.  His blood sugars of late have been  erratic, running from between 130 and 170, where they had previously been  running in the 90s to 100s.  He denies fevers, chills, recent infections,  weight loss, TIA symptoms, weakness, dysphagia, syncope, presyncope,  amaurosis fugax, heart palpitations, shortness of breath, dyspnea on  exertion, orthopnea, paroxysmal nocturnal dyspnea, abdominal pain, nausea,  vomiting, diarrhea, constipation, reflux symptoms, hematochezia, melena,  hematuria, dysuria, nocturia, muscle or joint problems, depression, anxiety,  or other psychiatric illness, intolerance to heat or cold.   PHYSICAL EXAMINATION:  VITAL SIGNS:  Blood pressure is 122/64, heart rate 65  and regular, respirations 18 and unlabored, temperature 97.6.  GENERAL:  This is a well-developed, well-nourished white male in no acute  distress.  HEENT:  Normocephalic, atraumatic.  Pupils equal, round, and react to light  and accommodation.  Extraocular movements intact.  Exam of external ears and  nose revealed no abnormalities.  Oropharynx is clear with upper dentures in  place and he has no lower dentures in place.  NECK:  Supple without lymphadenopathy, thyromegaly, or carotid bruits.  HEART:  Regular rate and rhythm without murmurs, rubs, or gallops.  He has a  strong mechanical valve click.  LUNGS:  Clear to auscultation.  ABDOMEN:  Soft, nontender, nondistended, with active bowel sounds in all  quadrants.  He is obese.  No masses or hepatosplenomegaly.  Normal active  bowel sounds. EXTREMITIES:  No clubbing, cyanosis, or edema.  He has 2-3 mm ulcer  on the  tip of the left great toe with no purulence or drainage.  The surrounding  area is mildly red.  He has well-healed saphenectomy scars up his entire  left leg and from his mid calf to his ankle on the right.  He has 2+  palpable femoral pulses bilaterally with no distal pulses.  NEUROLOGIC:  Cranial nerves II-XII grossly intact.  He is alert and oriented  x3.  His gait is unable to be tested.  Upper and lower extremity muscle  strength is 5+ and symmetrical.   ASSESSMENT/PLAN:  This is a 70 year old white male with history of  peripheral vascular occlusive disease with a nonhealing left great toe  ulcer.  He also has a history of a St. Jude mechanical aortic valve on  chronic Coumadin therapy.   PLAN:  He will be admitted today and started on IV heparin.  His Coumadin  has already been discontinued.  He will hopefully proceed with angiography  on Tuesday, Jan 08, 2005, by Dr. Darrick Penna if his INR is within normal limits.      GC/MEDQ  D:  01/07/2005  T:  01/07/2005  Job:  [id number]   cc:   Nicki Guadalajara, M.D.  [phone] [address], [address]  [place], [place] [address]  Fax: [phone]   Nanetta Batty, M.D.  Fax: [phone]   Dr. Justice Britain, [place]

## 2011-01-04 NOTE — Discharge Summary (Signed)
NAME:  Tanner Pena, Tanner Pena             ACCOUNT NO.:  [id number]   MEDICAL RECORD NO.:  [id number]          PATIENT TYPE:  INP   LOCATION:  3732                         FACILITY:  [facility]   PHYSICIAN:  Lerone A. Alanda Amass, M.D.DATE OF BIRTH:  [birth year]   DATE OF ADMISSION:  06/07/2004  DATE OF DISCHARGE:  06/18/2004                                 DISCHARGE SUMMARY   ADMISSION DIAGNOSIS:  1.  Chest pain, positive history of coronary artery disease, status post      coronary artery bypass graft in 1990 with redo in 1999.  2.  Status post aortic valve replacement with a St. Jude mechanical valve in      1990 on chronic Coumadin.  3.  Peripheral vascular occlusive disease.  4.  Hypertension.  5.  Adult onset diabetes mellitus, non-insulin dependent.  6.  Hyperlipidemia.  7.  Obesity.   DISCHARGE DIAGNOSIS:  1.  Chest pain, positive history of coronary artery disease, status post      coronary artery bypass graft in 1990 with redo in 1999.  2.  Status post aortic valve replacement with a St. Jude mechanical valve in      1990 on chronic Coumadin.  3.  Peripheral vascular occlusive disease.  4.  Hypertension.  5.  Adult onset diabetes mellitus, non-insulin dependent.  6.  Hyperlipidemia.  7.  Obesity.   PROCEDURE:  1.  Cardiac catheterization June 11, 2004, with complex PCI and Taxus      stenting of the RCA with a 3 by 30 mm Taxus.  Perforation of the right      coronary artery saphenous vein graft.  2.  2D echo, June 11, 2004.   BRIEF HISTORY:  The patient is a 70 year old white male cardiology patient  of Dr. Nicki Guadalajara, who is admitted with chest pain.  He developed nausea,  shortness of breath, flushed sensation, was brought to the ER and admitted  to rule out myocardial infarction.   PAST MEDICAL HISTORY:  Adult onset diabetes mellitus, hypertension, gout,  hyperlipidemia, and COPD.  Status post CABG x 1 and AVR in 1990.  A St. Jude  mechanical valve was used.  He  was on chronic Coumadin.  He is status post  redo coronary artery bypass grafting x 6 in 1994 with left internal mammary  artery to the LAD, saphenous vein graft to the circumflex, saphenous vein  graft to the RCA.  The patient has a history of peripheral vascular disease  with prior PCI and stenting of the right SFA and the right external iliac.  He also has 50% left renal artery stenosis.  2D echo April 2005 showed  normal EF, trace MR, trace AR.  Cardiolite in April 2005 showed an EF of  59%, inferior wall showed prominence, possible MI with peri-infarction  ischemia.  Rotator cuff and back surgery.   SOCIAL HISTORY:  He is married with five children, quit smoking in 2002,  occasional alcohol.  He is active around his home.   ALLERGIES:  None.   MEDICATIONS ON ADMISSION:  Allopurinol 300 mg daily, Hyzaar 100/25 one  daily,  Toprol XL 100 mg daily, Zocor 40 mg daily, Amaryl 4 mg daily, Lasix  40 mg has been discontinued, KCL 20 mEq daily, Coumadin 5 mg daily, aspirin  81 mg daily, and Neurontin 300 mg b.i.d.   For further history and physical, please see the dictated note.   HOSPITAL COURSE:  The patient was admitted and placed on heparin and  nitroglycerin.  CK and troponins were negative.  Despite this, it was Dr.  Alanda Amass opinion that the patient should undergo cardiac catheterization.  He was therapeutic on his admission.  He was taken off his Coumadin and his  INR was down to 2.5.  He was placed on heparin in anticipation of cardiac  catheterization.  His chest pain improved after admission.  He had no  significant recurrences.  On June 11, 2004, he underwent a left heart  cath by Dr. Caprice Kluver.  This showed a normal left main.  The native  circumflex was completely occluded at the mid portion.  The LAD was occluded  after the first diagonal.  The RCA had 100% ostial stenosis.  The saphenous  vein graft to obtuse marginals one, two, and three, circumflex graft was OK.  OM  and circumflex were normal.  Saphenous vein graft to the RCA had a  proximal 95% stenosis and the LIMA to the LAD was patent.   After reviewing the films, it was decided to intervene with the RCA and the  patient underwent PCI and Taxus stenting of his ICA, he went from a 95%  stenosis to a 25% irregularity using a 3 by 32 mm Taxus stent.  The patient  developed bleeding from the vein outside into the pericardium and a  perfusion balloon was placed over this area and blown up to 10 atmospheres.  The bleeding discontinued.  He was seen by Dr. Laneta Simmers and a 2D echo showed  no pericardial effusion.  The patient became stable and was transferred back  to the CCU.  He got a good result from his PCI and Taxus stenting and had no  further problems with bleeding and no further chest pain.  He was started  back on Coumadin and was maintained on IV heparin.  Since then, he has done  well.  By the a.m. of June 18, 2004, the patient was ready for discharge.  At that point, his INR was 1.2.  He had received 10 mg Coumadin for four  days in a row.  He was given an additional 10 this a.m. with instructions to  take 7.5 in the a.m. of November 1 and have  his protime rechecked on  November 2.   DISCHARGE MEDICATIONS:  Coumadin 10 mg October 28 through October 31, 7.5 on  November 1, with a protime on November 2.  Plavix 75 mg daily, Allopurinol  300 mg daily, Toprol XL 100 mg daily, potassium chloride 40 mEq daily,  Hyzaar 100/25 one daily, Zocor 40 mg daily, Amaryl 4 mg daily, Neurontin 300  mg b.i.d., nitroglycerin sublingual p.r.n.   DISCHARGE INSTRUCTIONS:  A follow up appointment has already been scheduled  to our office and that information has been called to the patient's home.  Discharge activity:  Light to moderate, no lifting over 10 pounds, no  driving, no strenuous activity.   LABORATORY DATA:  Hemoglobin 12.7, hematocrit 36.7, white count 7.8, platelets 247,000.  Chem-7 shows sodium  138, potassium 4.2, chloride 105,  CO2 25, BUN 17, creatinine 1.2, and a glucose of 118.  Protime as noted  above, is 19.3 seconds with an INR of 2.  The patient was given an  additional 10 mg Coumadin today, October 31, and is scheduled to take 7.5  tomorrow.       WDJ/MEDQ  D:  06/18/2004  T:  06/18/2004  Job:  [id number]   cc:   Nicki Guadalajara, M.D.  [phone], [address]  [place], [place] [address]  Fax: [phone]

## 2011-01-04 NOTE — H&P (Signed)
NAME:  Tanner Pena, Tanner Pena NO.:  [id number]   MEDICAL RECORD NO.:  [id number]                    PATIENT TYPE:   LOCATION:                                       FACILITY:   PHYSICIAN:  Nanetta Batty, M.D.                DATE OF BIRTH:  Aug 09, 1941   DATE OF ADMISSION:  06/30/2002  DATE OF DISCHARGE:                                HISTORY & PHYSICAL   CHIEF COMPLAINT:  Claudication.   HISTORY OF PRESENT ILLNESS:  The patient is a 70 year old male followed by  Dr. Tresa Pena and Dr. Artis Pena.  He had bypass surgery with a vein graft to  his RCA in 1990 and at that time also had a St Jude AVR for aortic stenosis.  He had redo bypass surgery in 1994 with LIMA to LAD and SVG to circumflex.  Last echocardiogram was May 2003 which showed good LV function and LVH.  Cardiolite study done March 2003 showed question of inferior ischemia versus  diaphragmatic attenuation.  Recently he was evaluated by Dr. Allyson Pena in the  office for claudication. Doppler study done 06/16/2002 revealed an ABI of  0.81 to 0.74 on the right and 1.24 to 1.17 on the left.  He is admitted now  for peripheral angiogram.  His symptoms are consistent with claudication.  His last dose of Coumadin was after Sunday night, 06/27/2002.  He is  scheduled for angiogram on 07/01/2002.  He is denying chest pain or unusual  dyspnea.  He does continue to have symptoms of lower extremity pain when  walking, relieved by rest.   PAST MEDICAL HISTORY:  1. Hypertension.  2. Hyperlipidemia.  3. Gout.  4. Left bundle branch block.   CURRENT MEDICATIONS:  1. Coumadin 5 mg a day with 7.5 mg on Tuesday and Thursday.  2. Norvasc 10 mg a day.  3. TriCor 160 q.d.  4. Aspirin 81 mg a day.  5. Toprol XL 75 mg a day.  6. Prinivil 40 mg a day.  7. Allopurinol 300 mg a day.  8. Hydrochlorothiazide on a p.r.n. basis.  9. Neurontin 300 mg a day.   ALLERGIES:  No known drug allergies.   SOCIAL HISTORY:  He is  divorced.  He has five children, four grandchildren.  He is a half-pack-a-day smoker.  He drinks alcohol occasionally.   FAMILY HISTORY:  Remarkable for coronary disease.  His father died at 68 of  an MI; his mother died at 30 of an MI.  He is an only child.   REVIEW OF SYSTEMS:  Remarkable for recent urinary tract infection, treated  with antibiotics by his primary care doctor.  He finished his antibiotics  two days ago.  There is no history of peptic ulcer disease or GI bleeding.  He denies any history of thyroid problems.  He has had remote kidney stones.   PHYSICAL EXAMINATION:  VITAL SIGNS:  Blood pressure 132/72, pulse 68,  respirations 16.  GENERAL:  Well developed, well nourished male in no acute distress.  HEENT:  Normocephalic. Extraocular movements intact.  Sclerae anicteric.  Lids and conjunctivae within normal limits.  NECK:  Without JVD.  He does have bilateral carotid artery bruits.  Carotid  Doppler study done 06/16/2002 showed no significant internal carotid  disease.  CHEST: Clear to auscultation and percussion.  CARDIAC:  Regular rate and rhythm with a soft systolic murmur at the left  sternal border in aortic valve area with positive valve click.  ABDOMEN:  Nontender.  No hepatosplenomegaly is appreciated.  EXTREMITIES:  Without edema.  Distal pulses are diminished.  He does have  bilateral femoral artery bruits.  NEUROLOGIC:  Grossly intact.  He is awake, alert, oriented, cooperative.  Moves all extremities without obvious deficit.   IMPRESSION:  1. Claudication with abnormal Doppler studies.  2. History of coronary disease with bypass surgery in 1990, St. Jude aortic     valve replacement at that time, redo bypass surgery in 1994, last     Cardiolite study March 2003 showing no significant ischemia.  3. Controlled hypertension.  4. Treated hyperlipidemia.  5. Gout.  6. Left bundle branch block.  7. History of smoking.   PLAN:  The patient will be admitted  to telemetry today and started on IV  heparin pending his INR.  He is for peripheral angiogram tomorrow at 11 a.m.     Tanner Pena, P.A.                      Nanetta Batty, M.D.    Tanner Pena  D:  06/30/2002  T:  06/30/2002  Job:  [id number]

## 2011-01-04 NOTE — H&P (Signed)
NAME:  Tanner Pena, Tanner Pena             ACCOUNT NO.:  [id number]   MEDICAL RECORD NO.:  [id number]          PATIENT TYPE:  INP   LOCATION:  2001                         FACILITY:  [facility]   PHYSICIAN:  Sparrow A. Alanda Amass, M.D.DATE OF BIRTH:  1940/11/14   DATE OF ADMISSION:  10/17/2004  DATE OF DISCHARGE:                                HISTORY & PHYSICAL   HISTORY OF PRESENT ILLNESS:  Mr. Tanner Pena is a 70 year old white separated  male patient of Dr. Nicki Guadalajara and Dr. Nanetta Batty who has defined  history of coronary artery disease and peripheral vascular disease. He  underwent his first bypass graft in 1990 with an SVG to his RCA. At that  time, he also had a St. Jude aortic valve replacement. He had redo bypass  surgery in 1994 which included a LIMA to his LAD and a sequential SVG to his  OM1, 2, and 3 and to the distal circumflex.   HE was seen recently in the office by Dr. Nanetta Batty on October 15, 2004; at which time, she was found to be complaining about increased  claudication and skin breakdown. He was sent for repeat lower extremity  Dopplers. He was found to have an ABI on the left of 0.13 and adequate for  healing. Thus, he comes in the hospital stay for heparin to Coumadin cross  over for PV angiogram and probable intervention. He does have a history of a  PTA and stenting of his right external iliac artery and right SFA artery on  June 23 2002. His last cardiac catheterization was June 11, 2004; at  which time, he had a complex PCI and a Taxus stenting of his SVG to his RCA.  A stent was placed in the proximal portion of the graft. He did have some  perforation in this area and he was treated with a perfusion balloon.  Postprocedure, he did have a false aneurysm of 2.5 cm in the left groin.  This was later injected with thrombin by Dr. Cristy Hilts. [place].   Today, he states he has had increased claudication. He does not have rest  pain currently. He is having  difficulty walking. He has only had one episode  of chest pain about a week ago. He did not use nitroglycerin. He denies any  shortness of breath, any syncope, any presyncope, any tachypalpitations  associated with the chest pain.   PAST MEDICAL HISTORY:  1.  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  2.  ASPVD as stated above.  3.  History of false pseudoaneurysm after his last catheterization November      2005 with thrombin injection of the left groin. I am unsure why the      right groin was not used for access.  4.  History of gout.  5.  History of hypertension.  6.  Hyperlipidemia.  7.  Noninsulin-dependent diabetes mellitus.  8.  Peripheral neuropathy.  9.  Persantine-Cardiolite on December 05, 2003 showing EF of 59% with no      significant ischemia.  10. A 2-D echocardiogram on June 11, 2004 done at [facility] with an EF of 40-50%.   MEDICATIONS:  1.  Allopurinol 300 mg q.d.  2.  Toprol XL 100 mg q.d.  3.  Hyzaar 100/25 mg q.d.  4.  Zocor 40 mg at bed time.  5.  Amaryl 4 mg q.d.  6.  Furosemide 40 mg q.d.  7.  Potassium 20 mEq q.d.  8.  Plavix 75 mg q.d.  9.  Coumadin on 7.5 mg on Monday and Friday and 5 mg other days. His last      dose of Coumadin was on Sunday night.  10. Tri-Chlor 145 mg q.d.  11. Neurontin 300 mg q.d.  12. Darvocet 100 mg q.d.   ALLERGIES:  NKDA.   FAMILY HISTORY:  Mother died at age 42 of CAD and cancer. Mother's  grandmother lived to be 60. Father died at age 48 of a MI-that was his  first. He had no brothers or sisters.   SOCIAL HISTORY:  He is separated. He has five children and seven  grandchildren. He has been married six times. He does not smoke. He  apparently recently quit. He drinks alcohol only infrequently. He is  disabled from heart disease.   REVIEW OF SYMPTOMS:  He had chest pain one week ago as stated above. He had  presyncope. No syncope. No dizziness. No recent viral infection. No fever.  No __________ weakness.   No dysuria. No abdominal pain. No indigestion.  Review of systems are negative.   PHYSICAL EXAMINATION:  VITAL SIGNS:  Blood pressure is 133/61, pulse is 61,  temperature is 97.1, respirations are 20. Room air O2 saturations are 94%.  Weight is 239. Height is 70 inches.  GENERAL:  He is in no acute distress. He is pleasant.  LUNGS:  Clear to auscultation bilaterally. Good inspiratory effort.  HEART:  Regular rate and rhythm. S1 and S2 are present. He does have 1/6  systolic ejection murmur. He has a crisp click. He has 2+ carotid, positive  bruits, greater on the right, 2+ radial pulses. Nonpalpable left pedal  pulse. He has positive dependent rubra on the left foot. He has whitened  area on his left fifth digit laterally. I do not see any open wounds on his  foot. He has elevation pallor.  ABDOMEN:  Bowel sounds are present x 4. Obese.  NEUROLOGICAL:  He is alert and oriented x 3.  EXTREMITIES:  Moves all extremities x 4.   ASSESSMENT:  1.  Left lower extremity ischemia, inadequate for healing. Symptoms of      worsened claudication. He has had some skin breaks.  2.  Known atherosclerotic peripheral vascular disease with history of      peripheral vascular angiogram in 2003 with right superficial femoral      artery and right external iliac artery percutaneous transluminal      arteriogram. He at that time had an 80% left mid-superficial femoral      artery lesion.  3.  Recent cardiac catheterization on November 2005; at which time, he had a      Taxus stent placed to his saphenous vein graft to his right coronary      artery. He did have a perforation at that time to his balloon procedure.  4.  Recent pseudoaneurysm of his left groin secondary to cardiac      catheterization. He had a thrombin injection by Dr. Cristy Hilts. Gan[place].  5.  Noninsulin-dependent diabetes mellitus.  6.  History of gout.  7.  Hypertension.  8.  St. Jude aortic valve.  9.  Coumadin anticoagulation on hold. Will  be started on heparin when his      INR is less than 2.5.  10. Peripheral neuropathy.   PLAN:  He is scheduled for PV angiogram tomorrow if his INR is 1.6 or less.  He will be started on IV heparin tonight if his INR is less than 2.5.  Procedure to be performed by Dr. Pearletha Furl. Alanda Amass. Also tonight  __________ get a Taxus stent on November 2005.      BB/MEDQ  D:  10/17/2004  T:  10/18/2004  Job:  [id number]

## 2011-01-04 NOTE — Discharge Summary (Signed)
NAME:  Tanner Pena, Tanner Pena             ACCOUNT NO.:  [id number]   MEDICAL RECORD NO.:  [id number]          PATIENT TYPE:  INP   LOCATION:  5706                         FACILITY:  [facility]   PHYSICIAN:  Janetta Hora. Fields, MD  DATE OF BIRTH:  16-Apr-1941   DATE OF ADMISSION:  01/07/2005  DATE OF DISCHARGE:  01/12/2005                                 DISCHARGE SUMMARY   PRIMARY ADMITTING DIAGNOSIS:  Toe ulcer.   ADDITIONAL/DISCHARGE DIAGNOSES:  1.  Toe ulcer.  2.  Nonreconstructible peripheral vascular occlusive disease.  3.  Coronary artery disease, status post coronary artery bypass graft/aortic      valve replacement with mechanical St. Jude aortic valve.  4.  Hypertension.  5.  Gout.  6.  Type 2 noninsulin-dependent diabetes mellitus.  7.  Hyperlipidemia.  8.  Peripheral neuropathy.  9.  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  10. Peripheral vascular occlusive disease, status post left superficial      femoral artery, right superficial femoral artery, and external iliac      artery stenting.   PROCEDURE PERFORMED:  Aortogram with bilateral lower extremity runoff.   HISTORY:  The patient is a 70 year old white male with a known history of  peripheral vascular occlusive disease.  In the past he has been followed by  Dr. Tresa Endo and Dr. Allyson Sabal and has undergone previous stenting to his right  external iliac and his bilateral superficial femoral arteries.  He continues  to complain of claudication symptoms with ambulating only short distances.  Over the past two months he has developed a nonhealing great toe ulcer on  his left foot.  He has been seen at the foot clinic and treated without  success with p.o. antibiotics and local wound care.  He is referred to Dr.  Fabienne Bruns for further evaluation.  He underwent ankle brachial indices  in our office, which were diminished at 0.81 on the right and  noncompressible on the left, but both sides were difficult studies  secondary  to heavy calcification.  Dr. Darrick Penna recommended proceeding with an  arteriogram to further delineate his anatomy and any surgical options.  Because of his history of mechanical aortic valve, he was brought in on Jan 07, 2005, for discontinuation of his Coumadin and to start on heparin in  preparation for his procedure.   HOSPITAL COURSE:  The patient was admitted on Jan 07, 2005.  His Coumadin  was reversed with vitamin K after starting him on heparin.  He underwent an  aortogram with bilateral lower extremity runoff on Jan 08, 2005, by Dr.  Darrick Penna.  He was found to have nonreconstructible disease.  He tolerated the  procedure well and was returned to the floor in stable condition.  He has  done well post procedure.  He has been stable from a vascular standpoint and  has had no evidence of hematoma or pseudoaneurysm in his groin.  He has  remained in the hospital while his anticoagulation was restarted.  He was  continued on heparin until his Coumadin was therapeutic.  At present his INR  is  1.8 with a PT of 18.2.  He has remained stable otherwise.  It is  anticipated that he will be ready for discharge home on Jan 12, 2005,  provided his INR is greater than 2.0.   DISCHARGE MEDICATIONS:  1.  Allopurinol 300 mg daily.  2.  Toprol XL 100 mg daily.  3.  Hyzaar 100/25 mg daily.  4.  Zocor 40 mg daily.  5.  Amaryl 4 mg daily.  6.  Lasix 40 mg daily.  7.  Potassium chloride 20 mEq daily.  8.  Coumadin 7.5 mg alternated with 5 mg Monday, Wednesday, Friday.  9.  Tricor 145 mg daily.  10. Neurontin 300 mg daily.  11. Plavix 75 mg daily.  12. Percocet 5/500 mg one to two p.o. q.4h. p.r.n. for pain.   DISCHARGE INSTRUCTIONS:  He is asked to increase his activity slowly and  continue ambulating daily as tolerated.  He will continue his same  preoperative diet.  He also may continue Silvadene dressing changes as  before to his great toe ulcer.   DISCHARGE FOLLOW-UP:  He will  see Dr. Darrick Penna back in the office on June 23  at 1:45 p.m.  He is also asked to have a PT and INR drawn to check his  Coumadin on Tuesday, June 30.  He may contact our office following discharge  if he experiences any problems or has problems.      GC/MEDQ  D:  01/11/2005  T:  01/12/2005  Job:  [id number]   cc:   Nanetta Batty, M.D.  Fax: [phone]   Nicki Guadalajara, M.D.  [phone] [address]  [place], [place] [address]  Fax: [phone]   Dr. Justice Britain, [place]

## 2011-01-04 NOTE — Op Note (Signed)
NAME:  Tanner Pena, Tanner Pena                       ACCOUNT NO.:  [id number]   MEDICAL RECORD NO.:  [id number]                   PATIENT TYPE:  INP   LOCATION:  3738                                 FACILITY:  [facility]   PHYSICIAN:  Almedia Balls. Ranell Patrick, M.D.              DATE OF BIRTH:  Jan 06, 1941   DATE OF PROCEDURE:  12/30/2003  DATE OF DISCHARGE:                                 OPERATIVE REPORT   PREOPERATIVE DIAGNOSES:  1. Right shoulder rotator cuff tear.  2. Chronic impingement.  3. Acromioclavicular joint arthrosis.  4. Superior labral tear anteriorly and posteriorly.   POSTOPERATIVE DIAGNOSIS:  1. Right shoulder rotator cuff tear.  2. Chronic impingement.  3. Acromioclavicular joint arthrosis.  4. Superior labral tear anteriorly and posteriorly.   PROCEDURES PERFORMED:  1. Right shoulder arthroscopy.  2. Extensive intra-articular debridement of superior labral tear anteriorly     and posteriorly.  3. Arthroscopic biceps tenotomy.  4. Debridement of rotator cuff tear.  5. Arthroscopic subacromial decompression with preservation of CA ligament.  6. Open distal clavicle excision.   ATTENDING SURGEON:  Almedia Balls. Ranell Patrick, M.D.   ASSISTANT:  None.   ANESTHESIA:  General.   ESTIMATED BLOOD LOSS:  Minimal.   FLUID REPLACEMENT:  1200 mL of crystalloid.  Perioperative antibiotics  given.   INSTRUMENT COUNT:  Correct.   COMPLICATIONS:  None.   INDICATIONS:  The patient is a 70 year old male who has a history of St.  Jude heart valve replacement on chronic Coumadin therapy.  He presents  complaining of right shoulder pain.  Preoperative workup consistent of MRI  scans and clinical evaluation revealing a rotator cuff tear on the right  shoulder.  The patient has failed conservative management to include  activity modification, injections, and anti-inflammatories.  He now presents  for operative treatment of his right shoulder rotator cuff tear.  Due to the  patient's chronic  Coumadin therapy, we need to maintain Coumadin therapy  with his heart valve and it was decided to bring the patient into the  hospital for crossover to heparin followed by surgery and then back on  heparin and starting Coumadin again.  The patient now presents for surgical  treatment.   DESCRIPTION OF PROCEDURE:  After an adequate level of anesthesia was  achieved, the patient was positioned in the modified beach chair position.  The right shoulder was examined under anesthesia.  He was noted to have full  forward elevation to 170 degrees, abduction to 100 degrees, and external  rotation 60 degrees.  On internal rotation of his waistline approximately 70-  80 degrees with his arm abducted.  After examination under anesthesia, the  shoulder was sterilely prepped and draped.  Diagnostic arthroscopy was  carried through standard arthroscopic portals anteriorly, posteriorly, and  laterally.  These were created in sterile fashion with infiltration of the  skin with 0.25% Marcaine with epinephrine followed by incision with an 11  blade scalpel and introduction of cannulas to the  joint using blunt  obturators.  Diagnostic arthroscopy revealed a torn superior labrum anterior  and posterior with involvement of the biceps anchor with some partial  tearing of that.  The biceps was tenotomized to remove stress on the  superior labrum and let retract down the bicipital groove.  The superior  labrum was debrided back to stable labral tissue.  The rotator cuff was  noted to be retracted back to the level of the glenoid.  The posterior  aspect of the cuff was intact, namely the posterior aspect of the  infraspinatus and teres minor.  The supraspinatus and most of the  infraspinatus were __________ retracted.  As mentioned, the subscapularis  showed some partial tearing, but the majority of it was intact.  Glenohumeral articular cartilage appeared intact.  Anterior and inferior  labrum normal.  At this  point, the scope was placed in the subacromial space  and a thorough bursectomy was performed followed by a subacromial  decompression.  The CA ligament was left intact to the anterolateral corner  of the acromion.  The acromioplasty was inspected from both posterior and  lateral aspects.  There were no noted to significant bone spurs in this area  and significant bursal inflammation.  This was all smoothed off as was the  rotator cuff and the greater tuberosity to provide a nice gliding in the  coracoacromial arch area.  The shoulder was taken through a full range of  motion.  No impingement was noted on the CA arch.  At this point, the scope  was concluded.  The distal clavicle was approach through a small saber  incision overlying the AC joint.  Dissection was carried sharply down  through subcutaneous tissues.  Deltoid trapezius fascia incised in line with  the distal clavicle.  Subperiosteal dissection of the distal clavicle was  performed followed by excision of the distal clavicle using an oscillating  saw 1 cm.  The distal clavicle was removed followed by bone wax at the end  of the clavicle.  Thorough irrigation of that area and closure of the  deltoid trapezius fascia using interrupted 0 Vicryl with buried knots and  figure-of-eight suture technique.  Subcutaneous closure with 2-0 Vicryl  followed by running 4-0 Monocryl for the skin and the portals.  Steri-Strips  applied followed by a shoulder sling.  The patient was taken to recovery  room in stable condition.                                               Almedia Balls. Ranell Patrick, M.D.    SRN/MEDQ  D:  12/30/2003  T:  01/01/2004  Job:  [id number]

## 2011-01-17 ENCOUNTER — Ambulatory Visit (HOSPITAL_COMMUNITY)
Admission: RE | Admit: 2011-01-17 | Discharge: 2011-01-17 | Disposition: A | Discharge status: Home / Self Care | Payer: Medicare Other | Source: Ambulatory Visit | Attending: Internal Medicine | Admitting: Internal Medicine

## 2011-01-17 ENCOUNTER — Encounter (HOSPITAL_COMMUNITY): Payer: Self-pay

## 2011-01-17 ENCOUNTER — Other Ambulatory Visit (HOSPITAL_COMMUNITY): Payer: Self-pay | Admitting: Internal Medicine

## 2011-01-17 DIAGNOSIS — R0602 Shortness of breath: Secondary | ICD-10-CM | POA: Insufficient documentation

## 2011-01-17 DIAGNOSIS — R059 Cough, unspecified: Secondary | ICD-10-CM | POA: Insufficient documentation

## 2011-01-17 DIAGNOSIS — G8929 Other chronic pain: Secondary | ICD-10-CM

## 2011-01-17 DIAGNOSIS — R05 Cough: Secondary | ICD-10-CM | POA: Insufficient documentation

## 2011-01-17 DIAGNOSIS — R918 Other nonspecific abnormal finding of lung field: Secondary | ICD-10-CM | POA: Insufficient documentation

## 2011-01-17 DIAGNOSIS — I10 Essential (primary) hypertension: Secondary | ICD-10-CM

## 2011-01-17 DIAGNOSIS — E119 Type 2 diabetes mellitus without complications: Secondary | ICD-10-CM

## 2011-04-02 ENCOUNTER — Telehealth: Payer: Self-pay

## 2011-04-02 NOTE — Telephone Encounter (Signed)
Unable to reach pt by phone. Number not working. Will send letter for pt to call and get triaged for a screening colonoscopy.

## 2011-04-18 ENCOUNTER — Telehealth: Payer: Self-pay

## 2011-04-18 NOTE — Telephone Encounter (Signed)
Pt's daughter called to get him set up for a colonoscopy. He is diabetic and has heart problems. Scheduled for OV on 04/29/2011 @ 10:00 Am with Gerrit Halls, NP. ( pt thought he had had a previous colonoscopy years ago in [place], could not remember doctor. Checked with MR at [facility], spoke with Dennie Bible. She said no record of colonoscopy.

## 2011-04-29 ENCOUNTER — Ambulatory Visit: Payer: Medicare Other | Admitting: Gastroenterology

## 2011-04-29 ENCOUNTER — Telehealth: Payer: Self-pay | Admitting: Gastroenterology

## 2011-04-30 NOTE — Telephone Encounter (Signed)
Routed to provider

## 2011-05-01 ENCOUNTER — Ambulatory Visit (INDEPENDENT_AMBULATORY_CARE_PROVIDER_SITE_OTHER): Payer: Medicare Other | Admitting: Gastroenterology

## 2011-05-01 ENCOUNTER — Encounter: Payer: Self-pay | Admitting: Gastroenterology

## 2011-05-01 VITALS — BP 104/59 | HR 56 | Temp 98.0°F | Ht 70.0 in | Wt 224.8 lb

## 2011-05-01 DIAGNOSIS — R1310 Dysphagia, unspecified: Secondary | ICD-10-CM

## 2011-05-01 DIAGNOSIS — Z1211 Encounter for screening for malignant neoplasm of colon: Secondary | ICD-10-CM

## 2011-05-01 DIAGNOSIS — R1314 Dysphagia, pharyngoesophageal phase: Secondary | ICD-10-CM

## 2011-05-01 NOTE — Patient Instructions (Signed)
We will call Dr. Tresa Endo to discuss coming off coumadin for your upper and lower endoscopy. Once we receive input from him, our office will call you and schedule the procedures.

## 2011-05-01 NOTE — Progress Notes (Signed)
Primary Care Physician:  Cassell Smiles., MD  Primary Gastroenterologist: Roetta Sessions, MD    Chief Complaint  Patient presents with  . Colonoscopy    HPI:  Tanner Pena is a 70 y.o. male here to schedule colonoscopy. He is also having problems with swallowing. Denies previous colonoscopy or EGD. Denies constipation, diarrhea, melena, rectal bleeding, unintentional weight loss, heartburn, odynophagia. Complains of dysphagia to dry breads such as cornbread. At times it will come back up. 20 pound weight loss which has been intentional. Denies difficulty swallowing pills or liquids. Difficulty with swallowing has become more frequent this year.  He states he's been putting off having EGD and colonoscopy because of his history of heart valve replacement and requirement for anticoagulation. Cardiologist is Dr. Nicki Guadalajara. Current Outpatient Prescriptions  Medication Sig Dispense Refill  . albuterol-ipratropium (COMBIVENT) 18-103 MCG/ACT inhaler Inhale 2 puffs into the lungs every 6 (six) hours as needed.        [name] allopurinol (ZYLOPRIM) 300 MG tablet Take 300 mg by mouth daily.        [name] amLODipine (NORVASC) 5 MG tablet Take 5 mg by mouth daily.        [name] aspirin 81 MG tablet Take 81 mg by mouth daily.        . fenofibrate (TRICOR) 145 MG tablet Take 145 mg by mouth daily.        . furosemide (LASIX) 80 MG tablet Take 80 mg by mouth 2 (two) times daily.        [name] glimepiride (AMARYL) 2 MG tablet Take 2 mg by mouth daily before breakfast.        . isosorbide dinitrate (ISORDIL) 30 MG tablet Take 30 mg by mouth 4 (four) times daily.        [name] losartan-hydrochlorothiazide (HYZAAR) 100-25 MG per tablet Take 1 tablet by mouth daily.        . metoprolol (LOPRESSOR) 50 MG tablet Take 50 mg by mouth 2 (two) times daily. 2.5 tabs a day        . oxyCODONE-acetaminophen (TYLOX) 5-500 MG per capsule Take 1 capsule by mouth every 6 (six) hours as needed.        . potassium chloride SA (K-DUR,KLOR-CON)  20 MEQ tablet Take 20 mEq by mouth daily.        . pregabalin (LYRICA) 50 MG capsule Take 50 mg by mouth 3 (three) times daily.        . rosuvastatin (CRESTOR) 20 MG tablet Take 20 mg by mouth daily.        . sitaGLIPtin (JANUVIA) 100 MG tablet Take 100 mg by mouth daily.        [name] warfarin (COUMADIN) 5 MG tablet Take 5 mg by mouth daily.          Allergies as of 05/01/2011  . (No Known Allergies)    Past Medical History  Diagnosis Date  . Diabetes mellitus 2007  . Hypertension   . Gout   . Hypercholesteremia   . Peripheral vascular disease     stents lower extremity  . Chronic pain     foot, hips  . Sleep apnea   . COPD (chronic obstructive pulmonary disease)   . S/P aortic valve replacement 1990    St. Jude    Past Surgical History  Procedure Date  . Back surgery 1610,[id number]    2  . Open heart surgery 1990    prosthetic heart valve, one bypass  . Rotator cuff repair  right  . Coronary artery bypass graft 1994    6 vessel  . Cataract extraction     bilateral  . Coronary stent placement 2005  . Peripheral vascular procedures lower extremities     right external iliac  artery PTA and stenting as well as bilateral SFA intervention remotely. Repeat procedures in 2011 bilaterally    Family History  Problem Relation Age of Onset  . Colon cancer Neg Hx   . Liver disease Neg Hx     History   Social History  . Marital Status: Legally Separated    Spouse Name: N/A    Number of Children: 5  . Years of Education: N/A   Occupational History  . retired     Air traffic controller   Social History Main Topics  . Smoking status: Current Everyday Smoker -- 0.5 packs/day    Types: Cigarettes  . Smokeless tobacco: Not on file  . Alcohol Use: Yes     socially, sometimes 12 ounce beer daily, may go month without  . Drug Use: No  . Sexually Active: Not on file   Other Topics Concern  . Not on file   Social History Narrative  . No narrative on file       ROS:  General: Negative for anorexia, weight loss, fever, chills, fatigue, weakness. Eyes: Negative for vision changes.  ENT: Negative for hoarseness, difficulty swallowing , nasal congestion. CV: Negative for chest pain, angina, palpitations, dyspnea on exertion, peripheral edema.  Respiratory: Negative for dyspnea at rest, dyspnea on exertion, cough, sputum, wheezing.  GI: See history of present illness. GU:  Negative for dysuria, hematuria, urinary incontinence, urinary frequency, nocturnal urination.  MS: Negative for joint pain, low back pain.  Derm: Negative for rash or itching.  Neuro: Negative for weakness, abnormal sensation, seizure, frequent headaches, memory loss, confusion.  Psych: Negative for anxiety, depression, suicidal ideation, hallucinations.  Endo: Negative for unusual weight change.  Heme: Negative for bruising or bleeding. Allergy: Negative for rash or hives.    Physical Examination:  BP 104/59  Pulse 56  Temp(Src) 98 F (36.7 C) (Temporal)  Ht 5\' 10"  (1.778 m)  Wt 224 lb 12.8 oz (101.969 kg)  BMI 32.26 kg/m2   General: Well-nourished, well-developed in no acute distress. Accompanied by daughter.  Head: Normocephalic, atraumatic.   Eyes: Conjunctiva pink, no icterus. Mouth: Oropharyngeal mucosa moist and pink , no lesions erythema or exudate. Upper/lower dentures. Neck: Supple without thyromegaly, masses, or lymphadenopathy.  Lungs: Clear to auscultation bilaterally.  Heart: Regular rate and rhythm, no murmurs rubs or gallops.  Abdomen: Bowel sounds are normal, nontender, nondistended, no hepatosplenomegaly or masses, no abdominal bruits or    hernia , no rebound or guarding.   Rectal: Defer to time of colonoscopy. Extremities: No lower extremity edema. No clubbing or deformities.  Neuro: Alert and oriented x 4 , grossly normal neurologically.  Skin: Warm and dry, no rash or jaundice.   Psych: Alert and cooperative, normal mood and affect.

## 2011-05-02 ENCOUNTER — Encounter: Payer: Self-pay | Admitting: Gastroenterology

## 2011-05-02 ENCOUNTER — Telehealth: Payer: Self-pay | Admitting: Gastroenterology

## 2011-05-02 DIAGNOSIS — Z1211 Encounter for screening for malignant neoplasm of colon: Secondary | ICD-10-CM | POA: Insufficient documentation

## 2011-05-02 NOTE — Progress Notes (Signed)
Cc to PCP 

## 2011-05-02 NOTE — Assessment & Plan Note (Signed)
Due for colon cancer screening. Denies bowel issues. He has a history of St. Jude's aortic valve replacement. We will discuss with his cardiologist Dr. Nicki Guadalajara regarding anticoagulation issues. Patient has had both heparin bridge and Lovenox bridge in the past. We'll schedule when instructions have been provided by his cardiologist. I have discussed the risks, alternatives, benefits with regards to but not limited to the risk of reaction to medication, bleeding, infection, perforation and the patient is agreeable to proceed. Written consent to be obtained.

## 2011-05-02 NOTE — Telephone Encounter (Signed)
Please contact Dr. Evlyn Clines office with [facility]. Ask them if patient needs heparin bridge OR Lovenox bridge. Pt to have esophagus dilated and colonoscopy. Also need to know if they recommend Spontaneous Bacterial Endocarditis prophylaxis due to his h/o heart valve replacement. Need to know as soon as possible.

## 2011-05-02 NOTE — Assessment & Plan Note (Signed)
Dysphagia to cornbread. Discussed with the patient that he may have esophageal ring and/or stricture. Recommend EGD with dilation at time of colonoscopy as he will be off anticoagulation and this will be a prime opportunity. Again, will await input from Dr. Nicki Guadalajara regarding his anticoagulation. Also will discuss with Dr. Benard Rink regarding patient's sedation due to chronic narcotic use and moderate alcohol consumption.EGD/ED in near future.  I have discussed the risks, alternatives, benefits with regards to but not limited to the risk of reaction to medication, bleeding, infection, perforation and the patient is agreeable to proceed. Written consent to be obtained.

## 2011-05-03 NOTE — Telephone Encounter (Signed)
Faxed request to Dr. Tresa Endo

## 2011-05-07 NOTE — Telephone Encounter (Signed)
Please call Dr. Landry Dyke office. We have been waiting long enough for response. Need to know about bridging and SBE prophylasix ASAP.

## 2011-05-08 NOTE — Telephone Encounter (Signed)
Tanner Pena called back, Dr. Gery Pray said pt does need abx and does need bridging. She is going to let their pharm D in the [place] office know and have them fax [place] the instructions. Gave her our fax number.

## 2011-05-08 NOTE — Telephone Encounter (Signed)
Called Dr. Landry Dyke office, spoke with Britta Mccreedy, she said she will find out and call us back.

## 2011-05-09 LAB — CULTURE, RESPIRATORY W GRAM STAIN: Culture: NORMAL

## 2011-05-09 LAB — CARDIAC PANEL(CRET KIN+CKTOT+MB+TROPI)
CK, MB: 1.5
CK, MB: 1.8
Relative Index: INVALID
Troponin I: 0.02

## 2011-05-09 LAB — DIFFERENTIAL
Basophils Relative: 1
Lymphocytes Relative: 11 — ABNORMAL LOW
Lymphs Abs: 1.3
Monocytes Relative: 7
Neutro Abs: 9.5 — ABNORMAL HIGH
Neutrophils Relative %: 79 — ABNORMAL HIGH

## 2011-05-09 LAB — CK TOTAL AND CKMB (NOT AT ARMC)
CK, MB: 2.8
Relative Index: 2.6 — ABNORMAL HIGH

## 2011-05-09 LAB — I-STAT 8, (EC8 V) (CONVERTED LAB)
Bicarbonate: 26.5 — ABNORMAL HIGH
Glucose, Bld: 145 — ABNORMAL HIGH
Hemoglobin: 16.7
Sodium: 140
TCO2: 28

## 2011-05-09 LAB — BASIC METABOLIC PANEL
BUN: 17
Calcium: 10
Calcium: 9.6
Chloride: 102
Chloride: 107
Creatinine, Ser: 1.04
GFR calc Af Amer: >60
GFR calc Af Amer: >60
GFR calc non Af Amer: >60
GFR calc non Af Amer: >60
Potassium: 3.3 — ABNORMAL LOW
Potassium: 3.7
Potassium: 4.1
Sodium: 136
Sodium: 139

## 2011-05-09 LAB — CBC
HCT: 40.3
HCT: 40.6
HCT: 41.5
HCT: 45.6
Hemoglobin: 13.3
Hemoglobin: 13.9
Hemoglobin: 14.2
Hemoglobin: 15.4
MCHC: 33
MCHC: 34.3
MCHC: 34.5
MCV: 91.3
Platelets: 282
Platelets: 291
RBC: 4.89
RDW: 13.4
RDW: 13.5
WBC: 12 — ABNORMAL HIGH
WBC: 9.6
WBC: 9.6

## 2011-05-09 LAB — PROTIME-INR
INR: 1.6 — ABNORMAL HIGH
INR: 1.7 — ABNORMAL HIGH
INR: 1.7 — ABNORMAL HIGH
INR: 1.8 — ABNORMAL HIGH
INR: 2.1 — ABNORMAL HIGH
Prothrombin Time: 20.7 — ABNORMAL HIGH
Prothrombin Time: 24.4 — ABNORMAL HIGH

## 2011-05-09 LAB — B-NATRIURETIC PEPTIDE (CONVERTED LAB): Pro B Natriuretic peptide (BNP): <30

## 2011-05-09 LAB — POCT I-STAT CREATININE
Creatinine, Ser: 0.9
Operator id: 265201

## 2011-05-09 LAB — APTT: aPTT: 183 — ABNORMAL HIGH

## 2011-05-09 LAB — HEPARIN LEVEL (UNFRACTIONATED)
Heparin Unfractionated: 0.25 — ABNORMAL LOW
Heparin Unfractionated: 0.34
Heparin Unfractionated: 0.48

## 2011-05-09 LAB — POCT CARDIAC MARKERS
CKMB, poc: 1.8
Troponin i, poc: <0.05

## 2011-05-09 LAB — TROPONIN I: Troponin I: 0.01

## 2011-05-09 LAB — EXPECTORATED SPUTUM ASSESSMENT W GRAM STAIN, RFLX TO RESP C

## 2011-05-09 NOTE — Telephone Encounter (Signed)
What is the status???? Would like to finalize this this week. Please let pt know what the hold up is.

## 2011-05-10 NOTE — Telephone Encounter (Signed)
Joyce Gross called back, she had spoken to [facility]- Dr. Allyson Sabal had asked her to send all info to the [place] office to the pharm D, she said all information was sent and waiting for response from the pharm D. Kristen the pharm D is off today. Joyce Gross gave me the number so I can call her on Monday. [phone])

## 2011-05-10 NOTE — Telephone Encounter (Signed)
Called Dr. Ellin Goodie office, spoke with Joyce Gross. She is going to have Britta Mccreedy return my call.

## 2011-05-10 NOTE — Telephone Encounter (Signed)
Tried to call pt- home number has been disconnected and LM on cell number for return call.

## 2011-05-10 NOTE — Telephone Encounter (Signed)
Noted. Please call patient and let them know we are still waiting for response from PharmD who will manage bridging.

## 2011-05-13 NOTE — Telephone Encounter (Signed)
Tried to call Baxter Hire at [place]. Had to leave voicemail. Asked her to contact us about pts abx and his bridging.

## 2011-05-14 NOTE — Telephone Encounter (Signed)
pts daughter- Joice Lofts aware that we are still working on this.

## 2011-05-14 NOTE — Telephone Encounter (Signed)
Spoke with Tanner Pena at [facility]. She said to go ahead and schedule pts procedures and she would take care of the abx and the bridging and pt instruction. She needs procedures to be at least 7-10 days out so she has time to get with pt. Once scheduled we need to call her and leave a voicemail with the date and time and let pt know date and time. Her number is [phone] ext [phone].

## 2011-05-16 ENCOUNTER — Other Ambulatory Visit: Payer: Self-pay | Admitting: Gastroenterology

## 2011-05-16 DIAGNOSIS — R1310 Dysphagia, unspecified: Secondary | ICD-10-CM

## 2011-05-16 NOTE — Telephone Encounter (Signed)
PT SCHEDULED FOR 10/25/- KRISTEN AT [facility]  IS AWARE- THEY WILL HANDLE THE BRIDGING PROCESS

## 2011-05-16 NOTE — Telephone Encounter (Signed)
Go ahead and schedule TCS/EGD/ED with Dr. Jena Gauss in OR. Patient has h/o chronic narcotics and moderate etoh use. Day of prep, 1/2 dose Amaryl and Januvia.  Please let the pharmD know, we like for the patient to be without Lovenox for 12 hours before the procedure (if BID dosing). If daily dosing, then no Lovenox within 18-24hours. Usually hold coumadin at least four days before.   Let me know what the official procedure date is when available.

## 2011-05-16 NOTE — Telephone Encounter (Signed)
Crystal please schedule pt and let Kristen at [facility] know date and time.

## 2011-05-20 HISTORY — PX: COLONOSCOPY: SHX174

## 2011-05-20 HISTORY — PX: ESOPHAGOGASTRODUODENOSCOPY: SHX1529

## 2011-05-24 LAB — BASIC METABOLIC PANEL
BUN: 12
Calcium: 10
GFR calc non Af Amer: >60
Glucose, Bld: 141 — ABNORMAL HIGH
Potassium: 4.4
Sodium: 138

## 2011-05-24 LAB — HEMOGLOBIN AND HEMATOCRIT, BLOOD: Hemoglobin: 15.5

## 2011-06-07 ENCOUNTER — Encounter (HOSPITAL_COMMUNITY)
Admission: RE | Admit: 2011-06-07 | Discharge: 2011-06-07 | Disposition: A | Discharge status: Home / Self Care | Payer: PRIVATE HEALTH INSURANCE | Source: Ambulatory Visit | Attending: Internal Medicine | Admitting: Internal Medicine

## 2011-06-07 ENCOUNTER — Encounter (HOSPITAL_COMMUNITY): Payer: Self-pay

## 2011-06-07 HISTORY — DX: Polyneuropathy, unspecified: G62.9

## 2011-06-07 HISTORY — DX: Other chronic pain: G89.29

## 2011-06-07 HISTORY — DX: Myoneural disorder, unspecified: G70.9

## 2011-06-07 HISTORY — DX: Dorsalgia, unspecified: M54.9

## 2011-06-07 LAB — CBC
MCH: 30.8 pg (ref 26.0–34.0)
MCV: 91.2 fL (ref 78.0–100.0)
Platelets: 212 10*3/uL (ref 150–400)
RBC: 4.64 MIL/uL (ref 4.22–5.81)
RDW: 14.8 % (ref 11.5–15.5)

## 2011-06-07 LAB — BASIC METABOLIC PANEL
CO2: 28 mEq/L (ref 19–32)
Calcium: 9.6 mg/dL (ref 8.4–10.5)
Creatinine, Ser: 1.2 mg/dL (ref 0.50–1.35)
GFR calc Af Amer: 69 mL/min — ABNORMAL LOW (ref >90)
GFR calc non Af Amer: 60 mL/min — ABNORMAL LOW (ref >90)
Sodium: 139 mEq/L (ref 135–145)

## 2011-06-07 NOTE — Patient Instructions (Signed)
[name]  06/07/2011   Your procedure is scheduled on:  Thursday, 06/13/11  Report to [facility] at 10:00 AM.  Call this number if you have problems the morning of surgery: [phone]   Remember:   Do not eat food:After Midnight.  Do not drink clear liquids: After Midnight.  Take these medicines the morning of surgery with A SIP OF WATER: Combivent, amlodipine, isordil, hyzaar, metoprolol. Please bring your inhaler with you.   Do not wear jewelry, make-up or nail polish.  Do not wear lotions, powders, or perfumes. You may wear deodorant.  Do not shave 48 hours prior to surgery.  Do not bring valuables to the hospital.  Contacts, dentures or bridgework may not be worn into surgery.  Leave suitcase in the car. After surgery it may be brought to your room.  For patients admitted to the hospital, checkout time is 11:00 AM the day of discharge.   Patients discharged the day of surgery will not be allowed to drive home.  Name and phone number of your driver: Hospital doctor  Special Instructions: Follow instructions given to you from Dr. Jeanella Flattery office.   Please read over the following fact sheets that you were given: Anesthesia Post-op Instructions and Care and Recovery After Surgery   Monitored Anesthesia Care (MAC)  MAC stands for monitored anesthesia care. MAC usually means a tube is not put in your trachea (windpipe). MAC may also be called moderate sedation. MAC usually involves giving intravenous anesthetic drugs, oxygen, watching vital signs and standard patient monitoring procedures similar to those used during a general anesthetic. MAC can be done without going to the operating room. MAC is typically used for small procedures that cannot be done with only local anesthesia. MAC usually means lower doses of anesthetic drugs. The recovery period tends to be shorter. The drugs used cause a lower level of awareness. This means you are partially awake and your reflexes are intact. You may  hear what is being said and feel some pressure, but should not feel pain. The drugs used may affect your ability to remember the procedure. If you have depressed consciousness and lose some protective reflexes, this is called deep sedation. If you become unconscious and fall completely asleep, this is general anesthesia. In both deep sedation and general anesthesia, the caregivers must make sure that your airway remains open. During MAC, the sedation-trained caregivers will:  Give medications which may include:   Sedatives.   Analgesics.   Hypnotics.   Other medications which are needed to keep you comfortable, safe and secure.   Give local anesthetic to numb the procedural site.   Monitor your level of consciousness.   Monitor your blood pressure.   Monitor your heart rate and rhythm.   Monitor your respirations and oxygen levels.   Monitor your airway.   Monitor your level of pain.   Evaluate and treat problems which may occur.  Document Released: 05/01/2005 Document Revised: 04/17/2011 Document Reviewed: 07/04/2009 [facility] Patient Information 2012 [place], [place].

## 2011-06-13 ENCOUNTER — Encounter (HOSPITAL_COMMUNITY)
Admission: RE | Disposition: A | Discharge status: Home / Self Care | Payer: Self-pay | Source: Ambulatory Visit | Attending: Internal Medicine

## 2011-06-13 ENCOUNTER — Encounter (HOSPITAL_COMMUNITY): Payer: Self-pay | Admitting: Anesthesiology

## 2011-06-13 ENCOUNTER — Encounter (HOSPITAL_COMMUNITY): Payer: Self-pay

## 2011-06-13 ENCOUNTER — Other Ambulatory Visit: Payer: Self-pay | Admitting: Internal Medicine

## 2011-06-13 ENCOUNTER — Ambulatory Visit (HOSPITAL_COMMUNITY)
Admission: RE | Admit: 2011-06-13 | Discharge: 2011-06-13 | Disposition: A | Discharge status: Home / Self Care | Payer: PRIVATE HEALTH INSURANCE | Source: Ambulatory Visit | Attending: Internal Medicine | Admitting: Internal Medicine

## 2011-06-13 ENCOUNTER — Ambulatory Visit (HOSPITAL_COMMUNITY): Payer: PRIVATE HEALTH INSURANCE | Admitting: Anesthesiology

## 2011-06-13 DIAGNOSIS — J4489 Other specified chronic obstructive pulmonary disease: Secondary | ICD-10-CM | POA: Insufficient documentation

## 2011-06-13 DIAGNOSIS — J449 Chronic obstructive pulmonary disease, unspecified: Secondary | ICD-10-CM | POA: Insufficient documentation

## 2011-06-13 DIAGNOSIS — Z7982 Long term (current) use of aspirin: Secondary | ICD-10-CM | POA: Insufficient documentation

## 2011-06-13 DIAGNOSIS — K2960 Other gastritis without bleeding: Secondary | ICD-10-CM

## 2011-06-13 DIAGNOSIS — Z1211 Encounter for screening for malignant neoplasm of colon: Secondary | ICD-10-CM | POA: Insufficient documentation

## 2011-06-13 DIAGNOSIS — Z01812 Encounter for preprocedural laboratory examination: Secondary | ICD-10-CM | POA: Insufficient documentation

## 2011-06-13 DIAGNOSIS — E119 Type 2 diabetes mellitus without complications: Secondary | ICD-10-CM | POA: Insufficient documentation

## 2011-06-13 DIAGNOSIS — Z7901 Long term (current) use of anticoagulants: Secondary | ICD-10-CM | POA: Insufficient documentation

## 2011-06-13 DIAGNOSIS — K2970 Gastritis, unspecified, without bleeding: Secondary | ICD-10-CM | POA: Insufficient documentation

## 2011-06-13 DIAGNOSIS — D128 Benign neoplasm of rectum: Secondary | ICD-10-CM | POA: Insufficient documentation

## 2011-06-13 DIAGNOSIS — R1310 Dysphagia, unspecified: Secondary | ICD-10-CM

## 2011-06-13 DIAGNOSIS — K5712 Diverticulitis of small intestine without perforation or abscess without bleeding: Secondary | ICD-10-CM

## 2011-06-13 DIAGNOSIS — K620 Anal polyp: Secondary | ICD-10-CM

## 2011-06-13 DIAGNOSIS — K621 Rectal polyp: Secondary | ICD-10-CM

## 2011-06-13 DIAGNOSIS — I10 Essential (primary) hypertension: Secondary | ICD-10-CM | POA: Insufficient documentation

## 2011-06-13 DIAGNOSIS — Z79899 Other long term (current) drug therapy: Secondary | ICD-10-CM | POA: Insufficient documentation

## 2011-06-13 DIAGNOSIS — K633 Ulcer of intestine: Secondary | ICD-10-CM | POA: Insufficient documentation

## 2011-06-13 HISTORY — PX: MALONEY DILATION: SHX5535

## 2011-06-13 LAB — GLUCOSE, CAPILLARY: Glucose-Capillary: 164 mg/dL — ABNORMAL HIGH (ref 70–99)

## 2011-06-13 SURGERY — COLONOSCOPY WITH PROPOFOL
Anesthesia: Monitor Anesthesia Care

## 2011-06-13 MED ORDER — BUTAMBEN-TETRACAINE-BENZOCAINE 2-2-14 % EX AERO
1.0000 | INHALATION_SPRAY | Freq: Once | CUTANEOUS | Status: AC
  Administered 2011-06-13: 1 via TOPICAL
  Filled 2011-06-13: qty 56

## 2011-06-13 MED ORDER — ONDANSETRON HCL 4 MG/2ML IJ SOLN: 4.0000 mg | Freq: Once | INTRAMUSCULAR | Status: DC | PRN

## 2011-06-13 MED ORDER — FENTANYL CITRATE 0.05 MG/ML IJ SOLN: 25.0000 ug | INTRAMUSCULAR | Status: DC | PRN

## 2011-06-13 MED ORDER — MIDAZOLAM HCL 2 MG/2ML IJ SOLN
1.0000 mg | INTRAMUSCULAR | Status: DC | PRN
  Administered 2011-06-13: 2 mg via INTRAVENOUS

## 2011-06-13 MED ORDER — MIDAZOLAM HCL 2 MG/2ML IJ SOLN
INTRAMUSCULAR | Status: AC
  Administered 2011-06-13: 2 mg via INTRAVENOUS
  Filled 2011-06-13: qty 2

## 2011-06-13 MED ORDER — PROPOFOL 10 MG/ML IV EMUL
INTRAVENOUS | Status: DC | PRN
  Administered 2011-06-13: 50 ug/kg/min via INTRAVENOUS

## 2011-06-13 MED ORDER — EPHEDRINE SULFATE 50 MG/ML IJ SOLN
INTRAMUSCULAR | Status: AC
  Filled 2011-06-13: qty 1

## 2011-06-13 MED ORDER — ONDANSETRON HCL 4 MG/2ML IJ SOLN
4.0000 mg | Freq: Once | INTRAMUSCULAR | Status: AC
  Administered 2011-06-13: 4 mg via INTRAVENOUS

## 2011-06-13 MED ORDER — GLYCOPYRROLATE 0.2 MG/ML IJ SOLN
0.2000 mg | Freq: Once | INTRAMUSCULAR | Status: AC
Start: 2011-06-13 — End: 2011-06-13
  Administered 2011-06-13: 0.2 mg via INTRAVENOUS

## 2011-06-13 MED ORDER — ONDANSETRON HCL 4 MG/2ML IJ SOLN
INTRAMUSCULAR | Status: AC
  Administered 2011-06-13: 4 mg via INTRAVENOUS
  Filled 2011-06-13: qty 2

## 2011-06-13 MED ORDER — FENTANYL CITRATE 0.05 MG/ML IJ SOLN
INTRAMUSCULAR | Status: AC
  Filled 2011-06-13: qty 2

## 2011-06-13 MED ORDER — PROPOFOL 10 MG/ML IV EMUL
INTRAVENOUS | Status: AC
  Filled 2011-06-13: qty 20

## 2011-06-13 MED ORDER — LACTATED RINGERS IV SOLN
INTRAVENOUS | Status: DC
  Administered 2011-06-13: 13:00:00 via INTRAVENOUS

## 2011-06-13 MED ORDER — WATER FOR IRRIGATION, STERILE IR SOLN
Status: DC | PRN
  Administered 2011-06-13: 1000 mL

## 2011-06-13 MED ORDER — FENTANYL CITRATE 0.05 MG/ML IJ SOLN
INTRAMUSCULAR | Status: DC | PRN
  Administered 2011-06-13: 50 ug via INTRAVENOUS

## 2011-06-13 MED ORDER — GLYCOPYRROLATE 0.2 MG/ML IJ SOLN
INTRAMUSCULAR | Status: AC
  Administered 2011-06-13: 0.2 mg via INTRAVENOUS
  Filled 2011-06-13: qty 1

## 2011-06-13 MED ORDER — STERILE WATER FOR IRRIGATION IR SOLN
Status: DC | PRN
  Administered 2011-06-13: 14:00:00

## 2011-06-13 SURGICAL SUPPLY — 26 items
BLOCK BITE 60FR ADLT L/F BLUE (MISCELLANEOUS) ×2 IMPLANT
DEVICE CLIP HEMOSTAT 235CM (CLIP) IMPLANT
ELECT REM PT RETURN 9FT ADLT (ELECTROSURGICAL)
ELECTRODE REM PT RTRN 9FT ADLT (ELECTROSURGICAL) IMPLANT
FCP BXJMBJMB 240X2.8X (CUTTING FORCEPS)
FLOOR PAD 36X40 (MISCELLANEOUS) ×2
FORCEP RJ3 GP 1.8X160 W-NEEDLE (CUTTING FORCEPS) ×2 IMPLANT
FORCEPS BIOP RAD 4 LRG CAP 4 (CUTTING FORCEPS) ×2 IMPLANT
FORCEPS BIOP RJ4 240 W/NDL (CUTTING FORCEPS)
FORCEPS BXJMBJMB 240X2.8X (CUTTING FORCEPS) IMPLANT
INJECTOR/SNARE I SNARE (MISCELLANEOUS) IMPLANT
LUBRICANT JELLY 4.5OZ STERILE (MISCELLANEOUS) ×1 IMPLANT
MANIFOLD NEPTUNE II (INSTRUMENTS) ×1 IMPLANT
MANIFOLD NEPTUNE WASTE (CANNULA) ×1 IMPLANT
NDL SCLEROTHERAPY 25GX240 (NEEDLE) ×1 IMPLANT
NEEDLE SCLEROTHERAPY 25GX240 (NEEDLE) IMPLANT
PAD FLOOR 36X40 (MISCELLANEOUS) ×1 IMPLANT
PROBE APC STR FIRE (PROBE) ×1 IMPLANT
PROBE INJECTION GOLD (MISCELLANEOUS)
PROBE INJECTION GOLD 7FR (MISCELLANEOUS) ×1 IMPLANT
SNARE ROTATE MED OVAL 20MM (MISCELLANEOUS) ×1 IMPLANT
SYR 50ML LL SCALE MARK (SYRINGE) ×1 IMPLANT
TRAP SPECIMEN MUCOUS 40CC (MISCELLANEOUS) IMPLANT
TUBING ENDO SMARTCAP PENTAX (MISCELLANEOUS) ×2 IMPLANT
TUBING IRRIGATION ENDOGATOR (MISCELLANEOUS) ×2 IMPLANT
WATER STERILE IRR 1000ML POUR (IV SOLUTION) ×2 IMPLANT

## 2011-06-13 NOTE — Anesthesia Preprocedure Evaluation (Addendum)
Anesthesia Evaluation  Patient identified by MRN, date of birth, ID band Patient awake  General Assessment Comment  Reviewed: Allergy & Precautions, H&P , NPO status , Patient's Chart, lab work & pertinent test results  History of Anesthesia Complications Negative for: history of anesthetic complications  Airway Mallampati: II      Dental  (+) Edentulous Upper and Edentulous Lower   Pulmonary sleep apnea COPD COPD inhaler Current Smoker  + rhonchi        Cardiovascular hypertension, Pt. on medications - angina (stble)with exertion + CAD, + Past MI, + Cardiac Stents, + CABG (with AVR) and + Peripheral Vascular Disease (femoral stents) Regular Bradycardia    Neuro/Psych PSYCHIATRIC DISORDERS (ETOH )  Neuromuscular disease    GI/Hepatic   Endo/Other  Diabetes mellitus-, Poorly Controlled, Type 2, Oral Hypoglycemic Agents  Renal/GU      Musculoskeletal   Abdominal   Peds  Hematology   Anesthesia Other Findings   Reproductive/Obstetrics                           Anesthesia Physical Anesthesia Plan  ASA: III  Anesthesia Plan: MAC   Post-op Pain Management:    Induction: Intravenous  Airway Management Planned: Simple Face Mask  Additional Equipment:   Intra-op Plan:   Post-operative Plan:   Informed Consent: I have reviewed the patients History and Physical, chart, labs and discussed the procedure including the risks, benefits and alternatives for the proposed anesthesia with the patient or authorized representative who has indicated his/her understanding and acceptance.     Plan Discussed with:   Anesthesia Plan Comments:         Anesthesia Quick Evaluation

## 2011-06-13 NOTE — Addendum Note (Signed)
Addendum  created 06/13/11 1424 by Glynn Octave   Modules edited:Anesthesia Flowsheet

## 2011-06-13 NOTE — H&P (Signed)
Tana Coast, PA 05/02/2011 12:32 PM Signed  Primary Care Physician: Cassell Smiles., MD  Primary Gastroenterologist: Roetta Sessions, MD  Chief Complaint   Patient presents with   .  Colonoscopy    HPI: Tanner Pena is a 70 y.o. male here to schedule colonoscopy. He is also having problems with swallowing. Denies previous colonoscopy or EGD. Denies constipation, diarrhea, melena, rectal bleeding, unintentional weight loss, heartburn, odynophagia. Complains of dysphagia to dry breads such as cornbread. At times it will come back up. 20 pound weight loss which has been intentional. Denies difficulty swallowing pills or liquids. Difficulty with swallowing has become more frequent this year.  He states he's been putting off having EGD and colonoscopy because of his history of heart valve replacement and requirement for anticoagulation. Cardiologist is Dr. Nicki Guadalajara.  Current Outpatient Prescriptions   Medication  Sig  Dispense  Refill   .  albuterol-ipratropium (COMBIVENT) 18-103 MCG/ACT inhaler  Inhale 2 puffs into the lungs every 6 (six) hours as needed.     [name]  allopurinol (ZYLOPRIM) 300 MG tablet  Take 300 mg by mouth daily.     [name]  amLODipine (NORVASC) 5 MG tablet  Take 5 mg by mouth daily.     [name]  aspirin 81 MG tablet  Take 81 mg by mouth daily.     .  fenofibrate (TRICOR) 145 MG tablet  Take 145 mg by mouth daily.     .  furosemide (LASIX) 80 MG tablet  Take 80 mg by mouth 2 (two) times daily.     [name]  glimepiride (AMARYL) 2 MG tablet  Take 2 mg by mouth daily before breakfast.     .  isosorbide dinitrate (ISORDIL) 30 MG tablet  Take 30 mg by mouth 4 (four) times daily.     [name]  losartan-hydrochlorothiazide (HYZAAR) 100-25 MG per tablet  Take 1 tablet by mouth daily.     .  metoprolol (LOPRESSOR) 50 MG tablet  Take 50 mg by mouth 2 (two) times daily. 2.5 tabs a day     .  oxyCODONE-acetaminophen (TYLOX) 5-500 MG per capsule  Take 1 capsule by mouth every 6 (six) hours as needed.     .   potassium chloride SA (K-DUR,KLOR-CON) 20 MEQ tablet  Take 20 mEq by mouth daily.     .  pregabalin (LYRICA) 50 MG capsule  Take 50 mg by mouth 3 (three) times daily.     .  rosuvastatin (CRESTOR) 20 MG tablet  Take 20 mg by mouth daily.     .  sitaGLIPtin (JANUVIA) 100 MG tablet  Take 100 mg by mouth daily.     [name]  warfarin (COUMADIN) 5 MG tablet  Take 5 mg by mouth daily.      Allergies as of 05/01/2011   .  (No Known Allergies)    Past Medical History   Diagnosis  Date   .  Diabetes mellitus  2007   .  Hypertension    .  Gout    .  Hypercholesteremia    .  Peripheral vascular disease      stents lower extremity   .  Chronic pain      foot, hips   .  Sleep apnea    .  COPD (chronic obstructive pulmonary disease)    .  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  Back surgery  9562,1308     2   .  Open heart surgery  1990     prosthetic heart valve, one bypass   .  Rotator cuff repair      right   .  Coronary artery bypass graft  1994     6 vessel   .  Cataract extraction      bilateral   .  Coronary stent placement  2005   .  Peripheral vascular procedures lower extremities      right external iliac artery PTA and stenting as well as bilateral SFA intervention remotely. Repeat procedures in 2011 bilaterally    Family History   Problem  Relation  Age of Onset   .  Colon cancer  Neg Hx    .  Liver disease  Neg Hx     History    Social History   .  Marital Status:  Legally Separated     Spouse Name:  N/A     Number of Children:  5   .  Years of Education:  N/A    Occupational History   .  retired      Air traffic controller    Social History Main Topics   .  Smoking status:  Current Everyday Smoker -- 0.5 packs/day     Types:  Cigarettes   .  Smokeless tobacco:  Not on file   .  Alcohol Use:  Yes      socially, sometimes 12 ounce beer daily, may go month without   .  Drug Use:  No   .  Sexually Active:  Not on  file    Other Topics  Concern   .  Not on file    Social History Narrative   .  No narrative on file    ROS:  General: Negative for anorexia, weight loss, fever, chills, fatigue, weakness.  Eyes: Negative for vision changes.  ENT: Negative for hoarseness, difficulty swallowing , nasal congestion.  CV: Negative for chest pain, angina, palpitations, dyspnea on exertion, peripheral edema.  Respiratory: Negative for dyspnea at rest, dyspnea on exertion, cough, sputum, wheezing.  GI: See history of present illness.  GU: Negative for dysuria, hematuria, urinary incontinence, urinary frequency, nocturnal urination.  MS: Negative for joint pain, low back pain.  Derm: Negative for rash or itching.  Neuro: Negative for weakness, abnormal sensation, seizure, frequent headaches, memory loss, confusion.  Psych: Negative for anxiety, depression, suicidal ideation, hallucinations.  Endo: Negative for unusual weight change.  Heme: Negative for bruising or bleeding.  Allergy: Negative for rash or hives.  Physical Examination:  BP 104/59  Pulse 56  Temp(Src) 98 F (36.7 C) (Temporal)  Ht 5\' 10"  (1.778 m)  Wt 224 lb 12.8 oz (101.969 kg)  BMI 32.26 kg/m2  General: Well-nourished, well-developed in no acute distress. Accompanied by daughter.  Head: Normocephalic, atraumatic.  Eyes: Conjunctiva pink, no icterus.  Mouth: Oropharyngeal mucosa moist and pink , no lesions erythema or exudate. Upper/lower dentures.  Neck: Supple without thyromegaly, masses, or lymphadenopathy.  Lungs: Clear to auscultation bilaterally.  Heart: Regular rate and rhythm, no murmurs rubs or gallops.  Abdomen: Bowel sounds are normal, nontender, nondistended, no hepatosplenomegaly or masses, no abdominal bruits or hernia , no rebound or guarding.  Rectal: Defer to time of colonoscopy.  Extremities: No lower extremity edema. No clubbing or deformities.  Neuro: Alert and oriented x 4 , grossly normal neurologically.  Skin:  Warm and dry, no rash or  jaundice.  Psych: Alert and cooperative, normal mood and affect.   [place] Score 05/02/2011 1:11 PM Signed  Cc to PCP Colon cancer screening - Tana Coast, PA 05/02/2011 12:26 PM Signed  Due for colon cancer screening. Denies bowel issues. He has a history of St. Jude's aortic valve replacement. We will discuss with his cardiologist Dr. Nicki Guadalajara regarding anticoagulation issues. Patient has had both heparin bridge and Lovenox bridge in the past. We'll schedule when instructions have been provided by his cardiologist. I have discussed the risks, alternatives, benefits with regards to but not limited to the risk of reaction to medication, bleeding, infection, perforation and the patient is agreeable to proceed. Written consent to be obtained.    I have seen & examined the patient prior to the procedure(s) today and reviewed the history and physical/consultation.  Coumadin has been held; bridged with Lovenox. There have been no changes.  After consideration of the risks, benefits, alternatives and imponderables, the patient has consented to the procedure(s).

## 2011-06-13 NOTE — Transfer of Care (Signed)
Immediate Anesthesia Transfer of Care Note  Patient: Tanner Pena  Procedure(s) Performed:  COLONOSCOPY WITH PROPOFOL - Procedure began at 1336. Biopsy taken. In cecum at 1342.  Total withdrawal time: . End 1353.; ESOPHAGOGASTRODUODENOSCOPY (EGD) WITH PROPOFOL - Procedure completed at 1331. Biopsy taken.; [name] - Dilated to 56.   Patient Location: PACU  Anesthesia Type: MAC  Level of Consciousness: awake, alert  and oriented  Airway & Oxygen Therapy: Patient Spontanous Breathing  Post-op Assessment: Report given to PACU RN  Post vital signs: Reviewed and stable  Complications: No apparent anesthesia complications

## 2011-06-13 NOTE — Anesthesia Postprocedure Evaluation (Signed)
  Anesthesia Post-op Note  Patient: Tanner Pena  Procedure(s) Performed:  COLONOSCOPY WITH PROPOFOL - Procedure began at 1336. Biopsy taken. In cecum at 1342.  Total withdrawal time: . End 1353.; ESOPHAGOGASTRODUODENOSCOPY (EGD) WITH PROPOFOL - Procedure completed at 1331. Biopsy taken.; [name] - Dilated to 56.   Patient Location: PACU  Anesthesia Type: MAC  Level of Consciousness: awake, alert  and oriented  Airway and Oxygen Therapy: Patient Spontanous Breathing  Post-op Pain: none  Post-op Assessment: Post-op Vital signs reviewed, Patient's Cardiovascular Status Stable, Respiratory Function Stable and Patent Airway  Post-op Vital Signs: Reviewed and stable  Complications: No apparent anesthesia complications

## 2011-06-19 ENCOUNTER — Encounter (HOSPITAL_COMMUNITY): Payer: Self-pay | Admitting: Internal Medicine

## 2011-06-26 ENCOUNTER — Other Ambulatory Visit (HOSPITAL_COMMUNITY): Payer: Self-pay | Admitting: Internal Medicine

## 2011-06-26 ENCOUNTER — Ambulatory Visit (HOSPITAL_COMMUNITY)
Admission: RE | Admit: 2011-06-26 | Discharge: 2011-06-26 | Disposition: A | Discharge status: Home / Self Care | Payer: PRIVATE HEALTH INSURANCE | Source: Ambulatory Visit | Attending: Internal Medicine | Admitting: Internal Medicine

## 2011-06-26 DIAGNOSIS — R059 Cough, unspecified: Secondary | ICD-10-CM | POA: Insufficient documentation

## 2011-06-26 DIAGNOSIS — R05 Cough: Secondary | ICD-10-CM

## 2011-06-26 DIAGNOSIS — Z951 Presence of aortocoronary bypass graft: Secondary | ICD-10-CM | POA: Insufficient documentation

## 2011-07-04 NOTE — Progress Notes (Signed)
Quick Note:  Labs from pre-op. ______

## 2011-07-07 ENCOUNTER — Encounter: Payer: Self-pay | Admitting: Internal Medicine

## 2011-07-09 NOTE — Progress Notes (Unsigned)
Letter from: Sharlette Dense Send letter to patient.  Send copy of letter with path to referring provider and PCP.   NEED A CTA OF ABDOMEN /PELVIS TO EVALUATE R COLON /ISCHEMIC ULCERS FURTHER ------------------------------------------------------------------------------------------------------------------------------------  Letter mailed to pt. Please schedule cta. Please cc pcp and referring provider.

## 2011-07-10 ENCOUNTER — Other Ambulatory Visit: Payer: Self-pay | Admitting: Gastroenterology

## 2011-07-10 DIAGNOSIS — L98499 Non-pressure chronic ulcer of skin of other sites with unspecified severity: Secondary | ICD-10-CM

## 2011-07-10 DIAGNOSIS — K633 Ulcer of intestine: Secondary | ICD-10-CM

## 2011-07-10 NOTE — Progress Notes (Signed)
Scheduled CTA for 07/16/11 @ 10:15- LMOM for pt to call me back

## 2011-07-16 ENCOUNTER — Ambulatory Visit (HOSPITAL_COMMUNITY): Payer: PRIVATE HEALTH INSURANCE | Attending: Gastroenterology

## 2011-07-17 NOTE — Telephone Encounter (Signed)
Seen 9/12

## 2011-07-18 ENCOUNTER — Other Ambulatory Visit: Payer: Self-pay | Admitting: Gastroenterology

## 2011-07-18 ENCOUNTER — Telehealth: Payer: Self-pay | Admitting: Gastroenterology

## 2011-07-18 DIAGNOSIS — K633 Ulcer of intestine: Secondary | ICD-10-CM

## 2011-07-18 DIAGNOSIS — L98499 Non-pressure chronic ulcer of skin of other sites with unspecified severity: Secondary | ICD-10-CM

## 2011-07-18 NOTE — Telephone Encounter (Signed)
Received approval from pts insurance- and CTA is scheduled for 12/04 @ 8:15- LMOM with details

## 2011-07-22 ENCOUNTER — Telehealth: Payer: Self-pay | Admitting: Internal Medicine

## 2011-07-22 NOTE — Telephone Encounter (Signed)
Pt' daughter called to cancel CT of Abd that is scheduled for tomorrow. I gave her the number to [facility] radiology for her to [facility]. She said it would be a few weeks out.

## 2011-07-22 NOTE — Telephone Encounter (Signed)
Routed to RMR 

## 2011-07-22 NOTE — Telephone Encounter (Signed)
PTS DAUGHTER CALLED BACK- HE IS RESCHEDULED FOR 12/14 @ 8:45- THEY ARE ALSO AWARE HE WILL NEED BLOOD WORK PRIOR TO SCAN- ORDER FAXED TO L[facility] Loh

## 2011-07-23 ENCOUNTER — Ambulatory Visit (HOSPITAL_COMMUNITY): Payer: PRIVATE HEALTH INSURANCE

## 2011-07-31 ENCOUNTER — Other Ambulatory Visit: Payer: Self-pay | Admitting: Gastroenterology

## 2011-08-01 LAB — CREATININE, SERUM: Creat: 1.12 mg/dL (ref 0.50–1.35)

## 2011-08-02 ENCOUNTER — Ambulatory Visit (HOSPITAL_COMMUNITY)
Admission: RE | Admit: 2011-08-02 | Discharge: 2011-08-02 | Disposition: A | Discharge status: Home / Self Care | Payer: PRIVATE HEALTH INSURANCE | Source: Ambulatory Visit | Attending: Gastroenterology | Admitting: Gastroenterology

## 2011-08-02 DIAGNOSIS — L98499 Non-pressure chronic ulcer of skin of other sites with unspecified severity: Secondary | ICD-10-CM

## 2011-08-02 DIAGNOSIS — K633 Ulcer of intestine: Secondary | ICD-10-CM | POA: Insufficient documentation

## 2011-08-02 DIAGNOSIS — K551 Chronic vascular disorders of intestine: Secondary | ICD-10-CM | POA: Insufficient documentation

## 2011-08-02 MED ORDER — IOHEXOL 300 MG/ML  SOLN
100.0000 mL | Freq: Once | INTRAMUSCULAR | Status: AC | PRN
  Administered 2011-08-02: 100 mL via INTRAVENOUS

## 2011-08-02 NOTE — Progress Notes (Signed)
Quick Note:  Called and informed pt. He will go to the ED if he develops abd pain moderate to severe. ______

## 2011-08-02 NOTE — Progress Notes (Signed)
Quick Note:  I DID NOT ORDER THIS STUDY. DR. Jena Gauss REQUESTED IT. PLEASE F/U WITH DR. Jena Gauss ON Monday MORNING FOR RECOMMENDATIONS.  Please call the patient and tell him that he has blockages to the arteries that supply blood flow to his intestines. Dr. Jena Gauss to provide recommendations ie likely angiography. Go ahead and start referral to interventional radiologist for this and let them know he is on coumadin for heart valve replacement.   Please tell patient, if he develops abdominal pain, moderate to severe, he should go to nearest ER. When I last saw him, he was not having abdominal pain but did have unintentional weight loss like due to mesenteric ischemia. ______

## 2011-08-07 ENCOUNTER — Other Ambulatory Visit: Payer: Self-pay | Admitting: Gastroenterology

## 2011-08-07 ENCOUNTER — Encounter: Payer: Self-pay | Admitting: Internal Medicine

## 2011-08-15 ENCOUNTER — Telehealth: Payer: Self-pay | Admitting: Emergency Medicine

## 2011-08-15 NOTE — Telephone Encounter (Signed)
DAUGHTER CALLED TO [facility]. APPT.   SAID PT SAW HIS PCP AND HE WANTS PT. TO HAVE STENTING PERFORMED BY DR J. BERRY.  SHE ALSO STATED THAT SHE DID CONTACT DR Jeanella Flattery OFFICE TO MAKE THEM AWARE.

## 2011-08-22 ENCOUNTER — Other Ambulatory Visit: Payer: PRIVATE HEALTH INSURANCE

## 2011-08-28 ENCOUNTER — Telehealth: Payer: Self-pay | Admitting: Internal Medicine

## 2011-08-28 NOTE — Telephone Encounter (Signed)
Spoke to Dr. Allyson Sabal who is seeing the patient in regards to the question of mesenteric ischemia with ascending colon ulcers and abnormal CT angiogram. We both noted patient was virtually asymptomatic from a GI standpoint. Ulcer biopsies consistent with ischemia but not pathognomonic. Only NSAID he was taking was aspirin in the setting of Coumadin.  Although he does have peripheral arterial disease, given that he really does not have any symptoms, Dr. Allyson Sabal felt that the conservative management at this time would be warranted. Of course, should he develop signs/symptoms of mesenteric ischemia further evaluation/intervention would be offered.  I fully agree with this approach.

## 2012-01-27 ENCOUNTER — Other Ambulatory Visit (HOSPITAL_COMMUNITY): Payer: Self-pay | Admitting: Cardiovascular Disease

## 2012-01-27 ENCOUNTER — Ambulatory Visit (HOSPITAL_COMMUNITY)
Admission: RE | Admit: 2012-01-27 | Discharge: 2012-01-27 | Disposition: A | Discharge status: Home / Self Care | Payer: PRIVATE HEALTH INSURANCE | Source: Ambulatory Visit | Attending: Cardiovascular Disease | Admitting: Cardiovascular Disease

## 2012-01-27 DIAGNOSIS — R0602 Shortness of breath: Secondary | ICD-10-CM | POA: Insufficient documentation

## 2012-01-27 DIAGNOSIS — J449 Chronic obstructive pulmonary disease, unspecified: Secondary | ICD-10-CM

## 2012-01-27 DIAGNOSIS — J4489 Other specified chronic obstructive pulmonary disease: Secondary | ICD-10-CM | POA: Insufficient documentation

## 2012-01-27 DIAGNOSIS — R059 Cough, unspecified: Secondary | ICD-10-CM | POA: Insufficient documentation

## 2012-01-27 DIAGNOSIS — R05 Cough: Secondary | ICD-10-CM | POA: Insufficient documentation

## 2012-04-17 ENCOUNTER — Other Ambulatory Visit (HOSPITAL_COMMUNITY): Payer: Self-pay | Admitting: Internal Medicine

## 2012-04-17 DIAGNOSIS — R109 Unspecified abdominal pain: Secondary | ICD-10-CM

## 2012-04-21 ENCOUNTER — Other Ambulatory Visit (HOSPITAL_COMMUNITY): Payer: PRIVATE HEALTH INSURANCE

## 2012-04-22 ENCOUNTER — Ambulatory Visit (HOSPITAL_COMMUNITY)
Admission: RE | Admit: 2012-04-22 | Discharge: 2012-04-22 | Disposition: A | Discharge status: Home / Self Care | Payer: PRIVATE HEALTH INSURANCE | Source: Ambulatory Visit | Attending: Internal Medicine | Admitting: Internal Medicine

## 2012-04-22 DIAGNOSIS — R109 Unspecified abdominal pain: Secondary | ICD-10-CM | POA: Insufficient documentation

## 2012-04-22 DIAGNOSIS — K5730 Diverticulosis of large intestine without perforation or abscess without bleeding: Secondary | ICD-10-CM | POA: Insufficient documentation

## 2012-04-22 DIAGNOSIS — R110 Nausea: Secondary | ICD-10-CM | POA: Insufficient documentation

## 2012-04-22 LAB — POCT I-STAT, CHEM 8
BUN: 30 mg/dL — ABNORMAL HIGH (ref 6–23)
Calcium, Ion: 1.28 mmol/L (ref 1.13–1.30)
HCT: 44 % (ref 39.0–52.0)
Sodium: 138 mEq/L (ref 135–145)
TCO2: 23 mmol/L (ref 0–100)

## 2012-04-22 MED ORDER — IOHEXOL 300 MG/ML  SOLN
100.0000 mL | Freq: Once | INTRAMUSCULAR | Status: AC | PRN
  Administered 2012-04-22: 100 mL via INTRAVENOUS

## 2012-04-22 NOTE — Progress Notes (Signed)
Blood sample obtained from left arm IV for Creatnine level.  

## 2012-11-02 ENCOUNTER — Encounter: Payer: Self-pay | Admitting: *Deleted

## 2012-11-11 ENCOUNTER — Ambulatory Visit: Payer: Self-pay | Admitting: Pharmacist Clinician (PhC)/ Clinical Pharmacy Specialist

## 2012-11-11 DIAGNOSIS — Z7901 Long term (current) use of anticoagulants: Secondary | ICD-10-CM

## 2012-11-11 DIAGNOSIS — Z952 Presence of prosthetic heart valve: Secondary | ICD-10-CM

## 2012-11-26 ENCOUNTER — Other Ambulatory Visit (HOSPITAL_COMMUNITY): Payer: Self-pay | Admitting: Cardiovascular Disease

## 2012-11-26 DIAGNOSIS — Z951 Presence of aortocoronary bypass graft: Secondary | ICD-10-CM

## 2012-11-26 DIAGNOSIS — Z952 Presence of prosthetic heart valve: Secondary | ICD-10-CM

## 2012-12-03 ENCOUNTER — Encounter (HOSPITAL_COMMUNITY): Payer: PRIVATE HEALTH INSURANCE

## 2012-12-03 ENCOUNTER — Ambulatory Visit (HOSPITAL_COMMUNITY): Payer: PRIVATE HEALTH INSURANCE

## 2012-12-18 ENCOUNTER — Other Ambulatory Visit (HOSPITAL_COMMUNITY): Payer: Self-pay | Admitting: Podiatry

## 2012-12-18 DIAGNOSIS — L98491 Non-pressure chronic ulcer of skin of other sites limited to breakdown of skin: Secondary | ICD-10-CM

## 2012-12-22 ENCOUNTER — Ambulatory Visit (HOSPITAL_COMMUNITY): Payer: PRIVATE HEALTH INSURANCE

## 2012-12-23 ENCOUNTER — Encounter: Payer: Self-pay | Admitting: Cardiovascular Disease

## 2012-12-24 ENCOUNTER — Other Ambulatory Visit (HOSPITAL_COMMUNITY): Payer: Self-pay | Admitting: Podiatry

## 2012-12-24 ENCOUNTER — Ambulatory Visit (HOSPITAL_COMMUNITY)
Admission: RE | Admit: 2012-12-24 | Discharge: 2012-12-24 | Disposition: A | Discharge status: Home / Self Care | Payer: PRIVATE HEALTH INSURANCE | Source: Ambulatory Visit | Attending: Podiatry | Admitting: Podiatry

## 2012-12-24 DIAGNOSIS — E1169 Type 2 diabetes mellitus with other specified complication: Secondary | ICD-10-CM | POA: Insufficient documentation

## 2012-12-24 DIAGNOSIS — L98491 Non-pressure chronic ulcer of skin of other sites limited to breakdown of skin: Secondary | ICD-10-CM

## 2012-12-24 DIAGNOSIS — L97509 Non-pressure chronic ulcer of other part of unspecified foot with unspecified severity: Secondary | ICD-10-CM | POA: Insufficient documentation

## 2012-12-29 ENCOUNTER — Ambulatory Visit (HOSPITAL_COMMUNITY)
Admission: RE | Admit: 2012-12-29 | Discharge: 2012-12-29 | Disposition: A | Discharge status: Home / Self Care | Payer: PRIVATE HEALTH INSURANCE | Source: Ambulatory Visit | Attending: Internal Medicine | Admitting: Internal Medicine

## 2012-12-29 ENCOUNTER — Ambulatory Visit (HOSPITAL_BASED_OUTPATIENT_CLINIC_OR_DEPARTMENT_OTHER)
Admission: RE | Admit: 2012-12-29 | Discharge: 2012-12-29 | Disposition: A | Discharge status: Home / Self Care | Payer: PRIVATE HEALTH INSURANCE | Source: Ambulatory Visit | Attending: Cardiovascular Disease | Admitting: Cardiovascular Disease

## 2012-12-29 DIAGNOSIS — I739 Peripheral vascular disease, unspecified: Secondary | ICD-10-CM | POA: Insufficient documentation

## 2012-12-29 DIAGNOSIS — I447 Left bundle-branch block, unspecified: Secondary | ICD-10-CM | POA: Insufficient documentation

## 2012-12-29 DIAGNOSIS — I517 Cardiomegaly: Secondary | ICD-10-CM | POA: Insufficient documentation

## 2012-12-29 DIAGNOSIS — Z952 Presence of prosthetic heart valve: Secondary | ICD-10-CM

## 2012-12-29 DIAGNOSIS — I2510 Atherosclerotic heart disease of native coronary artery without angina pectoris: Secondary | ICD-10-CM

## 2012-12-29 DIAGNOSIS — I10 Essential (primary) hypertension: Secondary | ICD-10-CM | POA: Insufficient documentation

## 2012-12-29 DIAGNOSIS — Z951 Presence of aortocoronary bypass graft: Secondary | ICD-10-CM

## 2012-12-29 DIAGNOSIS — E119 Type 2 diabetes mellitus without complications: Secondary | ICD-10-CM | POA: Insufficient documentation

## 2012-12-29 DIAGNOSIS — I6529 Occlusion and stenosis of unspecified carotid artery: Secondary | ICD-10-CM | POA: Insufficient documentation

## 2012-12-29 DIAGNOSIS — I359 Nonrheumatic aortic valve disorder, unspecified: Secondary | ICD-10-CM

## 2012-12-29 DIAGNOSIS — G4733 Obstructive sleep apnea (adult) (pediatric): Secondary | ICD-10-CM | POA: Insufficient documentation

## 2012-12-29 DIAGNOSIS — F17200 Nicotine dependence, unspecified, uncomplicated: Secondary | ICD-10-CM | POA: Insufficient documentation

## 2012-12-29 MED ORDER — TECHNETIUM TC 99M SESTAMIBI GENERIC - CARDIOLITE
10.5000 | Freq: Once | INTRAVENOUS | Status: AC | PRN
  Administered 2012-12-29: 11 via INTRAVENOUS

## 2012-12-29 MED ORDER — TECHNETIUM TC 99M SESTAMIBI GENERIC - CARDIOLITE
30.8000 | Freq: Once | INTRAVENOUS | Status: AC | PRN
  Administered 2012-12-29: 30.8 via INTRAVENOUS

## 2012-12-29 MED ORDER — REGADENOSON 0.4 MG/5ML IV SOLN
0.4000 mg | Freq: Once | INTRAVENOUS | Status: AC
  Administered 2012-12-29: 0.4 mg via INTRAVENOUS

## 2012-12-29 NOTE — Procedures (Addendum)
[facility] [address] [place] [place] [address] [phone]  Cardiology Nuclear Med Study  Tanner Pena is a 72 y.o. male     MRN : [id number]     DOB: 08/27/1940  Procedure Date: 12/29/2012  Nuclear Med Background Indication for Stress Test:  Graft Patency History:  COPD and CAD;CABG W/AVR-1990 AND 1994;STENT/PTCA 2005 Cardiac Risk Factors: Family History - CAD, Hypertension, Lipids, NIDDM, Overweight, PVD and Smoker  Symptoms:  Dizziness, Fatigue and SOB   Nuclear Pre-Procedure Caffeine/Decaff Intake:  10:00pm NPO After: 8:00am   IV Site: R Hand  IV 0.9% NS with Angio Cath:  22g  Chest Size (in):  48"  IV Started by: Emmit Pomfret, RN  Height: 5\' 10"  (1.778 m)  Cup Size: n/a  BMI:  Body mass index is 32.43 kg/(m^2). Weight:  226 lb (102.513 kg)   Tech Comments:  N/A    Nuclear Med Study 1 or 2 day study: 1 day  Stress Test Type:  Lexiscan  Order Authorizing Provider:  Nicki Guadalajara, MD   Resting Radionuclide: Technetium 27m Sestamibi  Resting Radionuclide Dose: 10.5 mCi   Stress Radionuclide:  Technetium 69m Sestamibi  Stress Radionuclide Dose: 30.8 mCi           Stress Protocol Rest HR: 49 Stress HR: 45  Rest BP: 126/60 Stress BP: 132/68  Exercise Time (min): n/a METS: n/a   Predicted Max HR: 148 bpm % Max HR: 41.22 bpm Rate Pressure Product: 59563  Dose of Adenosine (mg):  n/a Dose of Lexiscan: 0.4 mg  Dose of Atropine (mg): n/a Dose of Dobutamine: n/a mcg/kg/min (at max HR)  Stress Test Technologist: Esperanza Sheets, CCT Nuclear Technologist: Gonzella Lex, CNMT   Rest Procedure:  Myocardial perfusion imaging was performed at rest 45 minutes following the intravenous administration of Technetium 43m Sestamibi. Stress Procedure:  The patient received IV Lexiscan 0.4 mg over 15-seconds.  Technetium 58m Sestamibi injected at 30-seconds.  There were no significant changes with Lexiscan.  Quantitative  spect images were obtained after a 45 minute delay.  Transient Ischemic Dilatation (Normal <1.22):  1.01 Lung/Heart Ratio (Normal <0.45):  0.35 QGS EDV:  221 ml QGS ESV:  138 ml LV Ejection Fraction: 37%  Signed by Gonzella Lex, CNMT  Rest ECG: Sinus Bradycardia with Non-specifice IVCD, non-specifice ST-T chagnes associated with IVCD  Stress ECG: No significant ST segment change suggestive of ischemia., There are scattered PVCs. and at least one PVC couplet was noted.  QPS Raw Data Images:  Mild diaphragmatic attenuation.  Normal left ventricular size. Stress Images:  There is decreased uptake in the inferior wall.  There is decreased uptake in the lateral wall. There is a medium to large sized, moderate intensity perfuision defect noted in the mid to apical inferior -inferolateral wall extending proximally to the inferior-inferoseptal wall.  There is mild to moderate reversibility noted. Rest Images:  There is decreased uptake in the inferior wall.  There is decreased uptake in the lateral wall. Comparison with the stress images reveals the intensity is less notable, indicating reversibility. Subtraction (SDS):  There is a fixed defect that is most consistent with a previous infarction.  There are findings that are consistent with peri-infarct ischemia. The vessel distribution is consistent with documented CAD history.   Impression Exercise Capacity:  Lexiscan with no exercise. BP Response:  Normal blood pressure response. Clinical Symptoms:  There is dyspnea. ECG Impression:  No significant ST segment change suggestive of  ischemia.  LV Wall Motion:  Moderately reduced LV Function with inferior-inferolateral hypokinesis and abnormal thickening consistent withprior infarction in the Circumflex distribution.  Comparison with Prior Nuclear Study: As compared to the study from 2012, the infarct size does appear to be larger with persistent mild peri-infarct ischemia.  Overall Impression:   Intermediate risk stress nuclear study with increasing size of previously noted ischemia and persistent mild-moderate peri-infarct ischemia.. Clinical correlation is recommended.    Marykay Lex, MD  12/29/2012 2:05 PM

## 2012-12-29 NOTE — Progress Notes (Signed)
[facility]   2D echo completed 12/29/2012.   Veda Canning, RDCS

## 2012-12-31 ENCOUNTER — Other Ambulatory Visit: Payer: Self-pay | Admitting: *Deleted

## 2012-12-31 MED ORDER — WARFARIN SODIUM 5 MG PO TABS: 5.0000 mg | ORAL_TABLET | Freq: Every day | ORAL | Status: DC

## 2012-12-31 MED ORDER — LOSARTAN POTASSIUM-HCTZ 100-25 MG PO TABS: 1.0000 | ORAL_TABLET | Freq: Every day | ORAL | Status: DC

## 2013-01-05 ENCOUNTER — Ambulatory Visit (INDEPENDENT_AMBULATORY_CARE_PROVIDER_SITE_OTHER): Payer: PRIVATE HEALTH INSURANCE | Admitting: Pharmacist Clinician (PhC)/ Clinical Pharmacy Specialist

## 2013-01-05 ENCOUNTER — Encounter: Payer: Self-pay | Admitting: Cardiovascular Disease

## 2013-01-05 ENCOUNTER — Ambulatory Visit (INDEPENDENT_AMBULATORY_CARE_PROVIDER_SITE_OTHER): Payer: PRIVATE HEALTH INSURANCE | Admitting: Cardiovascular Disease

## 2013-01-05 VITALS — BP 130/70 | HR 58 | Ht 70.0 in | Wt 219.0 lb

## 2013-01-05 DIAGNOSIS — Z7901 Long term (current) use of anticoagulants: Secondary | ICD-10-CM

## 2013-01-05 DIAGNOSIS — R5383 Other fatigue: Secondary | ICD-10-CM

## 2013-01-05 DIAGNOSIS — R5381 Other malaise: Secondary | ICD-10-CM

## 2013-01-05 DIAGNOSIS — E785 Hyperlipidemia, unspecified: Secondary | ICD-10-CM

## 2013-01-05 DIAGNOSIS — I739 Peripheral vascular disease, unspecified: Secondary | ICD-10-CM

## 2013-01-05 DIAGNOSIS — I2510 Atherosclerotic heart disease of native coronary artery without angina pectoris: Secondary | ICD-10-CM | POA: Insufficient documentation

## 2013-01-05 DIAGNOSIS — Z952 Presence of prosthetic heart valve: Secondary | ICD-10-CM

## 2013-01-05 DIAGNOSIS — F17200 Nicotine dependence, unspecified, uncomplicated: Secondary | ICD-10-CM

## 2013-01-05 DIAGNOSIS — E119 Type 2 diabetes mellitus without complications: Secondary | ICD-10-CM

## 2013-01-05 DIAGNOSIS — Z954 Presence of other heart-valve replacement: Secondary | ICD-10-CM

## 2013-01-05 DIAGNOSIS — I10 Essential (primary) hypertension: Secondary | ICD-10-CM | POA: Insufficient documentation

## 2013-01-05 DIAGNOSIS — I6523 Occlusion and stenosis of bilateral carotid arteries: Secondary | ICD-10-CM

## 2013-01-05 DIAGNOSIS — I658 Occlusion and stenosis of other precerebral arteries: Secondary | ICD-10-CM

## 2013-01-05 DIAGNOSIS — I25810 Atherosclerosis of coronary artery bypass graft(s) without angina pectoris: Secondary | ICD-10-CM

## 2013-01-05 DIAGNOSIS — Z720 Tobacco use: Secondary | ICD-10-CM

## 2013-01-05 DIAGNOSIS — I6529 Occlusion and stenosis of unspecified carotid artery: Secondary | ICD-10-CM | POA: Insufficient documentation

## 2013-01-05 NOTE — Patient Instructions (Signed)
Your physician has requested that you have an echocardiogram. Echocardiography is a painless test that uses sound waves to create images of your heart. It provides your doctor with information about the size and shape of your heart and how well your heart's chambers and valves are working. This procedure takes approximately one hour. There are no restrictions for this procedure.  Follow up appoint with Dr. Tresa Endo in 6months.

## 2013-01-05 NOTE — Progress Notes (Signed)
Patient ID: Tanner Pena, male   DOB: 1940/10/28, 72 y.o.   MRN: [id number]  HPI: Tanner Pena, is a 72 y.o. male who presents to the office today for six-month Cardiologic followup evaluation  Mr. Tanner Pena is now 72 years old. In 1991 he underwent initial CABG revascularization surgery to his RCA and also underwent St. Jude's aortic valve replacement surgery for aortic valve stenosis. In 1994, he required redo CABG surgery to his left coronary system which was not bypassed in 1991.  In 2005, a stent was placed in his RCA vein graft the patient has significant peripheral vascular disease and is status post intervention to both his right iliac and bilateral SFAs with Dr. Allyson Sabal and has also undergone rotational atherectomy of his SFAs. Additionally, he has evidence for mild to moderate carotid stenosis, a history of hypertension, ongoing tobacco use, type 2 diabetes mellitus, as well as obstructive sleep apnea. His last carotid Doppler study was done in September 2013 showed moderate amount of fibrous plaque and right carotid internal stenosis of less than 49% both the right external carotid narrowing of 70-99%. 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 His left  internal carotid revealed 50-69% of diameter reduction. Mr Tanner Pena denies recent episodes of chest pain. He continues to smoke cigarettes less than one pack per day and has been smoking approximately 60 years , although he did quit on 3 occasions. He does note shortness of breath with activity. He denies loss of symptoms of claudication.  Past Medical History  Diagnosis Date  . Diabetes mellitus 2007  . Hypertension   . Gout   . Hypercholesteremia   . Peripheral vascular disease     stents lower extremity  . Chronic pain     foot, hips  . Sleep apnea   . COPD (chronic obstructive pulmonary disease)   . S/P aortic valve  replacement 1990    St. Jude  . Chronic back pain   . Dysphagia   . Neuromuscular disorder   . Peripheral neuropathy     Past Surgical History  Procedure Laterality Date  . Back surgery  4098,1191    2  . Open heart surgery  1990    prosthetic heart valve, one bypass  . Rotator cuff repair      right  . Cataract extraction      bilateral  . Coronary stent placement  2005    RCA vein graft A 3.0x13.0 TAXUS stent was then placed int he vessel a Viva 3.0x4.0 (perfusion balloon was made ready it was placed through the entire lenght of the stent  . Peripheral vascular procedures lower extremities      right external iliac  artery PTA and stenting as well as bilateral SFA intervention remotely. Repeat procedures in 2011 bilaterally  . Coronary artery bypass graft  1994    6 vessels  . [name]  06/13/2011    Procedure: Elease Hashimoto DILATION;  Surgeon: Corbin Ade, MD;  Location: [facility];  Service: Endoscopy;  Laterality: N/A;  Dilated to 56.     No Known Allergies  Current Outpatient Prescriptions  Medication Sig Dispense Refill  . albuterol (PROVENTIL) 2 MG tablet Take 1 tablet by mouth 2 (two) times daily.      [name] albuterol-ipratropium (COMBIVENT) 18-103 MCG/ACT inhaler Inhale 2 puffs into the lungs 2 (two) times daily as needed. Coughing/ Shortness of Breath      .  allopurinol (ZYLOPRIM) 300 MG tablet Take 300 mg by mouth at bedtime.       [name] amLODipine (NORVASC) 5 MG tablet Take 5 mg by mouth daily.       [name] aspirin 81 MG tablet Take 81 mg by mouth at bedtime.       . fenofibrate (TRICOR) 145 MG tablet Take 145 mg by mouth daily.       . furosemide (LASIX) 80 MG tablet Take 40 mg by mouth daily.       [name] glimepiride (AMARYL) 2 MG tablet Take 2 mg by mouth 2 (two) times daily.       . isosorbide dinitrate (ISORDIL) 30 MG tablet Take 30 mg by mouth daily.       [name] losartan-hydrochlorothiazide (HYZAAR) 100-25 MG per tablet Take 1 tablet by mouth at bedtime.  30 tablet  3  .  metoprolol (LOPRESSOR) 50 MG tablet Take 125 mg by mouth daily.       . potassium chloride SA (K-DUR,KLOR-CON) 20 MEQ tablet Take 20 mEq by mouth daily.        . pregabalin (LYRICA) 50 MG capsule Take 50 mg by mouth at bedtime.       . rosuvastatin (CRESTOR) 20 MG tablet Take 20 mg by mouth at bedtime.       . sitaGLIPtin (JANUVIA) 100 MG tablet Take 100 mg by mouth daily.       [name] warfarin (COUMADIN) 5 MG tablet Take 1 tablet (5 mg total) by mouth at bedtime. Patient takes 5 mg everyday except for Tues and Thurs when he takes 7.5 mg.  100 tablet  2   No current facility-administered medications for this visit.    Socially, he is married has 4 children 8 grandchildren. He does not routinely exercise he denies alcohol use. He is smoking one half to less than one pack per day   ROS is negative for fevers chills or night sweats. At times he does note some neck discomfort. He denies chest pressure. He denies increased cough. He is unaware of tachycardia palpitations. He does have a small umbilical hernia he denies bleeding per he denies worsening claudication. He does note shortness of breath with activity per he does note leg swelling particularly in the left intermittently. He denies recent bleeding episodes.    PE BP 130/70  Pulse 58  Ht 5\' 10"  (1.778 m)  Wt 219 lb (99.338 kg)  BMI 31.42 kg/m2  General: Alert, oriented, no distress.  HEENT: Normocephalic, atraumatic. Pupils round and reactive; sclera anicteric;  Mouth/Parynx benign; Mallinpatti scale 3 Neck: No JVD, bilateral carotid briuts Lungs: decreased breath sounds; no wheezing or rales Heart: RRR, s1 s2 normal  2/6 SEM with crisp prosthetic valve sounds Abdomen: soft, nontender; no hepatosplenomehaly, BS+; abdominal aorta nontender and not dilated by palpation. Small umbilical protrusion Pulses 2+ upper, slightly diminished lower extremities. Extremities: no clubbinbg cyanosis or edema, Homan's sign negative; L great toe bandaged   Neurologic: grossly nonfocal   ECG: SB at 54 with 1st degree AV block  LABS:    INR obtained today 4.8  Basic Metabolic Panel: No results found for this basename: NA, K, CL, CO2, GLUCOSE, BUN, CREATININE, CALCIUM, MG, PHOS,  in the last 72 hours Liver Function Tests: No results found for this basename: AST, ALT, ALKPHOS, BILITOT, PROT, ALBUMIN,  in the last 72 hours No results found for this basename: LIPASE, AMYLASE,  in the last 72 hours CBC: No results found for this basename:  WBC, NEUTROABS, HGB, HCT, MCV, PLT,  in the last 72 hours Cardiac Enzymes: No results found for this basename: CKTOTAL, CKMB, CKMBINDEX, TROPONINI,  in the last 72 hours BNP: No components found with this basename: POCBNP,  D-Dimer: No results found for this basename: DDIMER,  in the last 72 hours Hemoglobin A1C: No results found for this basename: HGBA1C,  in the last 72 hours Fasting Lipid Panel: No results found for this basename: CHOL, HDL, LDLCALC, TRIG, CHOLHDL, LDLDIRECT,  in the last 72 hours Thyroid Function Tests: No results found for this basename: TSH, T4TOTAL, FREET3, T3FREE, THYROIDAB,  in the last 72 hours Anemia Panel: No results found for this basename: VITAMINB12, FOLATE, FERRITIN, TIBC, IRON, RETICCTPCT,  in the last 72 hours  RADIOLOGY: US Arterial Seg Single  12/24/2012   *RADIOLOGY REPORT*  Clinical Data: Diabetes, nonhealing great toe ulcer, diabetes, hypertension  NONINVASIVE PHYSIOLOGIC VASCULAR STUDY OF BILATERAL LOWER EXTREMITIES  Technique:  Evaluation of both lower extremities were performed at rest, including calculation of ankle-brachial indices with single level Doppler, pressure and pulse volume recording.  Comparison:  None.  Findings:  Right ABI: 1.07  Left ABI: 0.68  Right Lower Extremity: Triphasic right tibial wave form and a biphasic right dorsalis pedis wave form.  No significant pressure gradient.  Normal ABI.  Right toe pressure 86.  Left Lower Extremity: Irregular  biphasic left tibial tracing and irregular monophasic left dorsalis pedis tracing.  48 mmHg pressure gradient in the left ankle compared to the left brachial pressure. This results in an abnormal ABI measuring 0.68.  Left toe pressure could not be obtained.  IMPRESSION: Abnormal left ABI measuring 0.68.  This is indicative of left lower extremity significant vascular disease.  Normal right ABI.   Original Report Authenticated By: Judie Petit. Shick, M.D.      ASSESSMENT AND PLAN: From a cardiovascular standpoint, Mr. Shinault is now 23 years status post initial CBG surgery to his right coronary artery at which time he underwent St. Jude aortic valve replacement surgery. He is 20 years status post CABG surgery to his left coronary system and 9 years status post stenting to his RCA vein graft. He is now 23 years since his valve replavement and I am recommending he undergo a one-year followup echo Doppler study for reassessment of valve competence and to further evaluate systolic and diastolic function in particular in light of his exertional shortness of breath. His last nuclear perfusion study was in May 2012. He does have carotid disease and will be scheduled for a one-year followup carotid duplex scan in September 2014. I am recommending laboratory be checked consisting of a CBC, CMet, TSH, and NMR lipoprotein a lipid panel and hemoglobin A1c. Medications will be adjusted accordingly. I again counseled him on the importance of smoking cessation. In the past he had quit smoking cigarettes for up to 5 years and on 2 other instances did quit for shorter duration. We discussed the importance of complete long-standing smoking cessation. We'll contact him regarding his laboratory if changes need to be made and with reference to his echo Doppler study. I will see him in 6 months for followup cartilage evaluation.       KELLY,THOMAS A 01/05/2013 11:37 AM

## 2013-01-07 LAB — CBC
MCH: 29.1 pg (ref 26.0–34.0)
MCHC: 32.6 g/dL (ref 30.0–36.0)
MCV: 89.1 fL (ref 78.0–100.0)
Platelets: 220 10*3/uL (ref 150–400)
RBC: 4.68 MIL/uL (ref 4.22–5.81)

## 2013-01-07 LAB — COMPREHENSIVE METABOLIC PANEL
AST: 16 U/L (ref 0–37)
Alkaline Phosphatase: 25 U/L — ABNORMAL LOW (ref 39–117)
BUN: 27 mg/dL — ABNORMAL HIGH (ref 6–23)
Creat: 1.05 mg/dL (ref 0.50–1.35)
Total Bilirubin: 0.5 mg/dL (ref 0.3–1.2)

## 2013-01-12 ENCOUNTER — Telehealth: Payer: Self-pay | Admitting: Cardiovascular Disease

## 2013-01-12 LAB — NMR LIPOPROFILE WITH LIPIDS
HDL Particle Number: 24.4 umol/L — ABNORMAL LOW (ref >=30.5)
HDL Size: 8.8 nm — ABNORMAL LOW (ref >=9.2)
HDL-C: 26 mg/dL — ABNORMAL LOW (ref >=40)
LP-IR Score: 66 — ABNORMAL HIGH (ref <=45)
Large HDL-P: 1.5 umol/L — ABNORMAL LOW (ref >=4.8)
Triglycerides: 219 mg/dL — ABNORMAL HIGH (ref <150)

## 2013-01-12 MED ORDER — FENOFIBRATE 145 MG PO TABS: 145.0000 mg | ORAL_TABLET | Freq: Every day | ORAL | Status: DC

## 2013-01-12 NOTE — Telephone Encounter (Signed)
Okay to schedule echo at [facility] per patient request.

## 2013-01-12 NOTE — Telephone Encounter (Signed)
Rx sent in for fenofibrate

## 2013-01-12 NOTE — Telephone Encounter (Signed)
Message forwarded to W. Waddell, CMA.  

## 2013-01-12 NOTE — Telephone Encounter (Signed)
Pt would like to have his Echo scheduled at [facility].

## 2013-01-18 ENCOUNTER — Inpatient Hospital Stay (HOSPITAL_COMMUNITY): Admission: RE | Admit: 2013-01-18 | Payer: PRIVATE HEALTH INSURANCE | Source: Ambulatory Visit

## 2013-01-19 ENCOUNTER — Ambulatory Visit (HOSPITAL_COMMUNITY): Payer: PRIVATE HEALTH INSURANCE

## 2013-01-20 ENCOUNTER — Telehealth: Payer: Self-pay | Admitting: *Deleted

## 2013-01-20 NOTE — Telephone Encounter (Signed)
Lab results from 01/12/13 given to patient.

## 2013-01-22 ENCOUNTER — Telehealth: Payer: Self-pay | Admitting: Pharmacist Clinician (PhC)/ Clinical Pharmacy Specialist

## 2013-01-22 MED ORDER — OMEGA-3 FATTY ACIDS 1000 MG PO CAPS: 1.0000 g | ORAL_CAPSULE | Freq: Two times a day (BID) | ORAL | Status: DC

## 2013-01-22 NOTE — Telephone Encounter (Signed)
Message copied by Rosalee Kaufman on Fri Jan 22, 2013  5:12 PM ------      Message from: Nicki Guadalajara A      Created: Fri Jan 22, 2013 12:00 PM       LDL P ok, consider adding fish oil ------

## 2013-01-22 NOTE — Telephone Encounter (Signed)
Pt to start fish oil 1gm bid.  Voiced understanding.

## 2013-01-25 ENCOUNTER — Other Ambulatory Visit (HOSPITAL_COMMUNITY): Payer: Self-pay | Admitting: Internal Medicine

## 2013-01-25 ENCOUNTER — Other Ambulatory Visit (HOSPITAL_COMMUNITY): Payer: PRIVATE HEALTH INSURANCE

## 2013-01-25 ENCOUNTER — Ambulatory Visit (HOSPITAL_COMMUNITY): Admission: RE | Admit: 2013-01-25 | Payer: PRIVATE HEALTH INSURANCE | Source: Ambulatory Visit

## 2013-01-25 DIAGNOSIS — L02619 Cutaneous abscess of unspecified foot: Secondary | ICD-10-CM

## 2013-01-27 ENCOUNTER — Ambulatory Visit (HOSPITAL_COMMUNITY)
Admission: RE | Admit: 2013-01-27 | Discharge: 2013-01-27 | Disposition: A | Discharge status: Home / Self Care | Payer: PRIVATE HEALTH INSURANCE | Source: Ambulatory Visit | Attending: Internal Medicine | Admitting: Internal Medicine

## 2013-01-27 ENCOUNTER — Other Ambulatory Visit (HOSPITAL_COMMUNITY): Payer: Self-pay | Admitting: Internal Medicine

## 2013-01-27 DIAGNOSIS — E119 Type 2 diabetes mellitus without complications: Secondary | ICD-10-CM | POA: Insufficient documentation

## 2013-01-27 DIAGNOSIS — L03039 Cellulitis of unspecified toe: Secondary | ICD-10-CM | POA: Insufficient documentation

## 2013-01-27 DIAGNOSIS — L02619 Cutaneous abscess of unspecified foot: Secondary | ICD-10-CM

## 2013-01-27 DIAGNOSIS — I10 Essential (primary) hypertension: Secondary | ICD-10-CM | POA: Insufficient documentation

## 2013-01-27 DIAGNOSIS — I739 Peripheral vascular disease, unspecified: Secondary | ICD-10-CM | POA: Insufficient documentation

## 2013-01-27 DIAGNOSIS — S91309A Unspecified open wound, unspecified foot, initial encounter: Secondary | ICD-10-CM | POA: Insufficient documentation

## 2013-01-27 DIAGNOSIS — IMO0001 Reserved for inherently not codable concepts without codable children: Secondary | ICD-10-CM | POA: Insufficient documentation

## 2013-01-27 NOTE — Progress Notes (Signed)
Physical Therapy - Wound Therapy  Evaluation   Patient Details  Name: SOSTENES KAUFFMANN MRN: [id number] Date of Birth: 1941/08/18 Charge:  evaluation Today's Date: 01/27/2013 Time: 8295-6213 Time Calculation (min): 20 min  Visit#: 1 of 8  Re-eval: 02/26/13  Subjective Subjective Assessment Subjective: Mr. Holsomback states he has had his wound for approximately two months now.  He has received two bouts of antibiotic but the foot is still red and the sore will not heal.  The patient states that he showed his foot MD who referred him to his diabetic MD,  His diabetic MD referred him to his primary MD who referred him  to therapy for wound care and back to his podiatrist.  He now has an appointment with a vascular surgeon on 02/03/2013.  His podiatrist and vascular surgeon have agreed that the wound should not be debrided until the vascular surgeon is able to assess the wound.  Patient and Family Stated Goals: wound to heal Date of Onset: 11/27/12  Pain Assessment Pain Assessment Pain Assessment: No/denies pain (Pain can get as high as a 7/10; less pain with elevation.)  Wound Therapy Wound 01/27/13 Other (Comment) Toe (Comment  which one) Left (Active)  Site / Wound Assessment Clean;Dry;Black 01/27/2013 10:43 AM  % Wound base Red or Granulating 0% 01/27/2013 10:43 AM  % Wound base Yellow 0% 01/27/2013 10:43 AM  % Wound base Black 100% 01/27/2013 10:43 AM  % Wound base Other (Comment) 0% 01/27/2013 10:43 AM  Peri-wound Assessment Erythema (non-blanchable) 01/27/2013 10:43 AM  Wound Length (cm) 1 cm 01/27/2013 10:43 AM  Wound Width (cm) 1 cm 01/27/2013 10:43 AM  Closure None 01/27/2013 10:43 AM  Drainage Amount None 01/27/2013 10:43 AM  Non-staged Wound Description Not applicable 01/27/2013 10:43 AM  Dressing Status None 01/27/2013 10:43 AM       Physical Therapy Assessment and Plan Wound Therapy - Assess/Plan/Recommendations Wound Therapy - Clinical Statement: Pt with history of two CABG and  multiple stents who has both venous and arterial insufficencies. ABI  .68.   Pt will be assessed by vascular surgeon next week. Will await vascular surgeon's assessment prior to beginning any debridment on the wound.  Pt skiln aroung foot and leg was very dry explained the importance in keeping skin moisturized to decrease risk of infection. Factors Delaying/Impairing Wound Healing: Diabetes Mellitus;Polypharmacy;Vascular compromise Wound Plan: await vascular surgeons recommendation for treatment.  Pt will be seen twice a week for four weeks if MD feels that debridement of the wound is indicated.      Goals Wound Therapy Goals - Improve the function of patient's integumentary system by progressing the wound(s) through the phases of wound healing by: Decrease Necrotic Tissue to: 0 Decrease Necrotic Tissue - Progress: Goal set today Increase Granulation Tissue to: 0 Increase Granulation Tissue - Progress: Goal set today Decrease Length/Width/Depth by (cm): healed Patient/Family will be able to : verbalized the importance in skin care in the role of reducing infection. Time For Goal Achievement: 2 weeks (4 weeks) Wound Therapy - Potential for Goals: Good  Problem List Patient Active Problem List   Diagnosis Date Noted  . CAD (coronary artery disease) of artery bypass graft 01/05/2013  . Tobacco abuse 01/05/2013  . PVD (peripheral vascular disease) 01/05/2013  . DM2 (diabetes mellitus, type 2) 01/05/2013  . HTN (hypertension) 01/05/2013  . Carotid stenosis 01/05/2013  . Hyperlipidemia LDL goal < 70 01/05/2013  . Long term (current) use of anticoagulants 11/11/2012  . H/O aortic valve replacement  11/11/2012  . Colon cancer screening 05/02/2011  . Esophageal dysphagia 05/01/2011  . CLOSED FRACTURE OF METATARSAL BONE 09/17/2010    GP Functional Limitation: Other PT primary Other PT Primary Current Status (Z6109): At least 20 percent but less than 40 percent impaired, limited or  restricted Other PT Primary Goal Status (U0454): 0 percent impaired, limited or restricted  Dinita Migliaccio,CINDY 01/27/2013, 10:51 AM  Your signature is required to indicate approval of the treatment plan as stated above.  Please sign and return making a copy for your files.  You may hard copy or send electronically.  Please check one: ___1.  Approve of this plan  ___2.  Approve of this plan with the following changes.   ____________________________                             _____________ Physician                                                                      Date

## 2013-02-01 ENCOUNTER — Ambulatory Visit: Payer: PRIVATE HEALTH INSURANCE | Admitting: Cardiovascular Disease

## 2013-02-03 ENCOUNTER — Ambulatory Visit (INDEPENDENT_AMBULATORY_CARE_PROVIDER_SITE_OTHER): Payer: PRIVATE HEALTH INSURANCE | Admitting: Cardiovascular Disease

## 2013-02-03 ENCOUNTER — Encounter: Payer: Self-pay | Admitting: Cardiovascular Disease

## 2013-02-03 VITALS — BP 120/56 | HR 55 | Ht 70.0 in | Wt 219.0 lb

## 2013-02-03 DIAGNOSIS — Z79899 Other long term (current) drug therapy: Secondary | ICD-10-CM

## 2013-02-03 DIAGNOSIS — Z7901 Long term (current) use of anticoagulants: Secondary | ICD-10-CM

## 2013-02-03 DIAGNOSIS — R5381 Other malaise: Secondary | ICD-10-CM

## 2013-02-03 DIAGNOSIS — I739 Peripheral vascular disease, unspecified: Secondary | ICD-10-CM

## 2013-02-03 DIAGNOSIS — I25810 Atherosclerosis of coronary artery bypass graft(s) without angina pectoris: Secondary | ICD-10-CM

## 2013-02-03 DIAGNOSIS — F17200 Nicotine dependence, unspecified, uncomplicated: Secondary | ICD-10-CM

## 2013-02-03 DIAGNOSIS — R5383 Other fatigue: Secondary | ICD-10-CM

## 2013-02-03 DIAGNOSIS — Z951 Presence of aortocoronary bypass graft: Secondary | ICD-10-CM

## 2013-02-03 DIAGNOSIS — Z720 Tobacco use: Secondary | ICD-10-CM

## 2013-02-03 NOTE — Assessment & Plan Note (Signed)
Patient has had multiple interventions on his lower extremities including stents in both SFAs as well as in the right external iliac artery. His left peripheral intervention was performed 07/19/10. He does have mild to moderate bilateral internal carotid artery disease as well. Unless several months he developed an ulcer of his left great toe of his left foot is somewhat cyanotic and cool. He saw his podiatrist in refill referred him back for further evaluation. A tourniquet followup Lotrimin Dopplers on him. These show occlusion of his left SFA he will need angiography and potential percutaneous intervention for critical limb ischemia and limb salvage. He'll need Lovenox bridging after stopping his Coumadin for his prosthetic aortic valve.

## 2013-02-03 NOTE — Progress Notes (Signed)
02/03/2013 Tanner Pena   Oct 21, 1940  [id number]  Primary Physician Cassell Smiles., MD Primary Cardiologist: Runell Gess MD [name]   HPI:  The patient is a 72-year-old, moderately overweight, married, Caucasian male father of 4, grandfather to 8 grandchildren. The patient is a cardiology patient of Dr. Devona Konig who I follow for peripheral vascular disease. He has a history of CAD status post bypass grafting in 1990 with a St. Jude AVR at that time. He had redo CABG in 1994. He has had percutaneous intervention to his vein grafts. I stented his SFAs bilaterally, as well as his right external iliac artery and had performed staged Diamondback orbital rotational atherectomy. We have been following his carotid and lower extremity Dopplers on a serial basis. His other problems include continued tobacco abuse, hypertension, hyperlipidemia, type 2 diabetes, and obstructive sleep apnea. He just saw Dr. Tresa Endo back last month. Most recent stress test performed a year ago was nonischemic. His last Doppler study of his lower extremities performed a year ago revealed patent stents, and carotid Dopplers revealed mild to moderate left ICA stenosis. He is neurologically asymptomatic. He does complain of hip pain with ambulation which may be musculoskeletal versus related to claudication. He's had a 2-D echo recently that showed a well functioning aortic prosthesis with mild to moderate LV dysfunction. A Myoview stress test did show some anterolateral ischemia but this has been reviewed by Dr. Tresa Endo. He has a  Nonhealing ulcer on his left great toe with some cyanosis of his left foot.    Current Outpatient Prescriptions  Medication Sig Dispense Refill  . albuterol (PROVENTIL) 2 MG tablet Take 1 tablet by mouth 2 (two) times daily.      [name] albuterol-ipratropium (COMBIVENT) 18-103 MCG/ACT inhaler Inhale 2 puffs into the lungs 2 (two) times daily as needed. Coughing/ Shortness of Breath       . allopurinol (ZYLOPRIM) 300 MG tablet Take 300 mg by mouth at bedtime.       [name] amLODipine (NORVASC) 5 MG tablet Take 5 mg by mouth daily.       [name] aspirin 81 MG tablet Take 81 mg by mouth at bedtime.       [name] doxycycline (VIBRA-TABS) 100 MG tablet Take 100 mg by mouth 2 (two) times daily.      . fenofibrate (TRICOR) 145 MG tablet Take 1 tablet (145 mg total) by mouth daily.  30 tablet  6  . fish oil-omega-3 fatty acids 1000 MG capsule Take 1 capsule (1 g total) by mouth 2 (two) times daily.      . furosemide (LASIX) 80 MG tablet Take 40 mg by mouth daily.       [name] glimepiride (AMARYL) 2 MG tablet Take 2 mg by mouth 2 (two) times daily.       . isosorbide mononitrate (IMDUR) 30 MG 24 hr tablet Take 30 mg by mouth daily.      [name] losartan-hydrochlorothiazide (HYZAAR) 100-25 MG per tablet Take 1 tablet by mouth at bedtime.  30 tablet  3  . metoprolol succinate (TOPROL-XL) 50 MG 24 hr tablet Take 2.5 tablets by mouth daily.      [name] oxyCODONE-acetaminophen (PERCOCET) 10-325 MG per tablet Take 1 tablet by mouth as needed.      . potassium chloride SA (K-DUR,KLOR-CON) 20 MEQ tablet Take 20 mEq by mouth daily.        . pregabalin (LYRICA) 50 MG capsule Take 50 mg by mouth at bedtime.       [name]  rosuvastatin (CRESTOR) 20 MG tablet Take 20 mg by mouth at bedtime.       . sitaGLIPtin (JANUVIA) 100 MG tablet Take 100 mg by mouth daily.       [name] warfarin (COUMADIN) 5 MG tablet Take 1 tablet (5 mg total) by mouth at bedtime. Patient takes 5 mg everyday except for Tues and Thurs when he takes 7.5 mg.  100 tablet  2  . FREESTYLE TEST STRIPS test strip        No current facility-administered medications for this visit.    No Known Allergies  History   Social History  . Marital Status: Legally Separated    Spouse Name: N/A    Number of Children: 5  . Years of Education: N/A   Occupational History  . retired     Air traffic controller   Social History Main Topics  . Smoking status: Current Every Day Smoker --  1.00 packs/day for 55 years    Types: Cigarettes    Start date: 08/19/1953  . Smokeless tobacco: Former Neurosurgeon    Types: Chew    Quit date: 08/20/1995     Comment: Has quit on 3 occasions. Counseling given today 5-10 minutes  . Alcohol Use: Yes     Comment: socially, sometimes 12 ounce beer daily, may go month without/no whiskey  . Drug Use: No  . Sexually Active: Not on file   Other Topics Concern  . Not on file   Social History Narrative   Has 3 daughters   Has 2 sons     Review of Systems: General: negative for chills, fever, night sweats or weight changes.  Cardiovascular: negative for chest pain, dyspnea on exertion, edema, orthopnea, palpitations, paroxysmal nocturnal dyspnea or shortness of breath Dermatological: negative for rash Respiratory: negative for cough or wheezing Urologic: negative for hematuria Abdominal: negative for nausea, vomiting, diarrhea, bright red blood per rectum, melena, or hematemesis Neurologic: negative for visual changes, syncope, or dizziness All other systems reviewed and are otherwise negative except as noted above.    Blood pressure 120/56, pulse 55, height 5\' 10"  (1.778 m), weight 219 lb (99.338 kg).  General appearance: alert and no distress Neck: no adenopathy, no JVD, supple, symmetrical, trachea midline, thyroid not enlarged, symmetric, no tenderness/mass/nodules and loud right carotid bruit Lungs: clear to auscultation bilaterally Heart: crisp prosthetic aortic valve sounds Extremities: small 3-4 mm black ischemic appearing ulcer on the bottom of his left great toe with cyanosis of his left foot. He has absent left pedal pulses  EKG sinus bradycardia at 55 with nonspecific IVCD  ASSESSMENT AND PLAN:   PVD (peripheral vascular disease) Patient has had multiple interventions on his lower extremities including stents in both SFAs as well as in the right external iliac artery. His left peripheral intervention was performed 07/19/10. He  does have mild to moderate bilateral internal carotid artery disease as well. Unless several months he developed an ulcer of his left great toe of his left foot is somewhat cyanotic and cool. He saw his podiatrist in refill referred him back for further evaluation. A tourniquet followup Lotrimin Dopplers on him. These show occlusion of his left SFA he will need angiography and potential percutaneous intervention for critical limb ischemia and limb salvage. He'll need Lovenox bridging after stopping his Coumadin for his prosthetic aortic valve.      Runell Gess MD FACP,FACC,FAHA, [facility] 02/03/2013 4:57 PM

## 2013-02-03 NOTE — Patient Instructions (Addendum)
Dr Allyson Sabal would like for you to have lower extremity doppers this week to look at the bloodflow in your arteries in your legs.  I will call you with theses results.  Prior to your peripheral angiogram will need to stop your coumadin and do lovenox injections in your stomach.  Belenda Cruise (our pharmacist) will instruct you on these instructions.      Dr. Allyson Sabal has ordered a peripheral angiogram to be done at [facility].  This procedure is going to look at the bloodflow in your lower extremities.  If Dr. Allyson Sabal is able to open up the arteries, you will have to spend one night in the hospital.  If he is not able to open the arteries, you will be able to go home that same day.    After the procedure, you will not be allowed to drive for 3 days or push, pull, or lift anything greater than 10 lbs for one week.    You will be required to have bloodwork and a chest xray prior to your procedure.  I sent the order to the [facility]. Our scheduler will advise you on when these items need to be done.

## 2013-02-05 ENCOUNTER — Encounter: Payer: Self-pay | Admitting: Cardiovascular Disease

## 2013-02-05 ENCOUNTER — Telehealth: Payer: Self-pay | Admitting: Pharmacist Clinician (PhC)/ Clinical Pharmacy Specialist

## 2013-02-05 ENCOUNTER — Encounter: Payer: Self-pay | Admitting: Pharmacist Clinician (PhC)/ Clinical Pharmacy Specialist

## 2013-02-05 MED ORDER — ENOXAPARIN SODIUM 100 MG/ML ~~LOC~~ SOLN: SUBCUTANEOUS | Status: DC

## 2013-02-05 NOTE — Progress Notes (Signed)
Quick Note:  Informed patient of recent lab results. Confirmed that Dr. Tresa Endo started him on fish oil. Patient confirms that he did. ______

## 2013-02-05 NOTE — Telephone Encounter (Signed)
Message copied by Rosalee Kaufman on Fri Feb 05, 2013  5:15 PM ------      Message from: Almira Coaster      Created: Fri Feb 05, 2013  4:37 PM       Pt is scheduled for an angiogram on July 7th. Will you please call to go over Lovenox.  ------

## 2013-02-05 NOTE — Telephone Encounter (Signed)
Letter mailed today with lovenox bridging instructions. Pt aware, Rx sent.

## 2013-02-09 ENCOUNTER — Encounter (HOSPITAL_COMMUNITY): Payer: Self-pay | Admitting: Respiratory Therapy

## 2013-02-11 ENCOUNTER — Ambulatory Visit (HOSPITAL_COMMUNITY)
Admission: RE | Admit: 2013-02-11 | Discharge: 2013-02-11 | Disposition: A | Discharge status: Home / Self Care | Payer: PRIVATE HEALTH INSURANCE | Source: Ambulatory Visit | Attending: Cardiovascular Disease | Admitting: Cardiovascular Disease

## 2013-02-11 DIAGNOSIS — Z01818 Encounter for other preprocedural examination: Secondary | ICD-10-CM | POA: Insufficient documentation

## 2013-02-11 DIAGNOSIS — Z720 Tobacco use: Secondary | ICD-10-CM

## 2013-02-11 DIAGNOSIS — Z951 Presence of aortocoronary bypass graft: Secondary | ICD-10-CM

## 2013-02-11 DIAGNOSIS — Z87891 Personal history of nicotine dependence: Secondary | ICD-10-CM | POA: Insufficient documentation

## 2013-02-11 LAB — BASIC METABOLIC PANEL
BUN: 28 mg/dL — ABNORMAL HIGH (ref 6–23)
CO2: 24 mEq/L (ref 19–32)
Chloride: 107 mEq/L (ref 96–112)
Potassium: 4.6 mEq/L (ref 3.5–5.3)

## 2013-02-11 LAB — CBC
MCH: 29.6 pg (ref 26.0–34.0)
MCHC: 34 g/dL (ref 30.0–36.0)
MCV: 87.1 fL (ref 78.0–100.0)
Platelets: 211 10*3/uL (ref 150–400)
RBC: 4.56 MIL/uL (ref 4.22–5.81)

## 2013-02-11 LAB — PROTIME-INR: INR: 3.79 — ABNORMAL HIGH (ref <1.50)

## 2013-02-12 ENCOUNTER — Other Ambulatory Visit: Payer: Self-pay | Admitting: *Deleted

## 2013-02-12 ENCOUNTER — Ambulatory Visit (HOSPITAL_COMMUNITY)
Admission: RE | Admit: 2013-02-12 | Discharge: 2013-02-12 | Disposition: A | Discharge status: Home / Self Care | Payer: PRIVATE HEALTH INSURANCE | Source: Ambulatory Visit | Attending: Cardiovascular Disease | Admitting: Cardiovascular Disease

## 2013-02-12 DIAGNOSIS — I739 Peripheral vascular disease, unspecified: Secondary | ICD-10-CM | POA: Insufficient documentation

## 2013-02-12 DIAGNOSIS — Z01818 Encounter for other preprocedural examination: Secondary | ICD-10-CM

## 2013-02-12 NOTE — Progress Notes (Signed)
Lower extremity arterial complete. GMG 

## 2013-02-12 NOTE — Progress Notes (Deleted)
Lower extremity venous limited complete. GMG 

## 2013-02-15 ENCOUNTER — Observation Stay (HOSPITAL_COMMUNITY)
Admission: RE | Admit: 2013-02-15 | Discharge: 2013-02-16 | Disposition: A | Discharge status: Home / Self Care | Payer: PRIVATE HEALTH INSURANCE | Source: Ambulatory Visit | Attending: Cardiovascular Disease | Admitting: Cardiovascular Disease

## 2013-02-15 ENCOUNTER — Encounter (HOSPITAL_COMMUNITY): Payer: Self-pay | Admitting: General Practice

## 2013-02-15 ENCOUNTER — Encounter (HOSPITAL_COMMUNITY)
Admission: RE | Disposition: A | Discharge status: Home / Self Care | Payer: Self-pay | Source: Ambulatory Visit | Attending: Cardiovascular Disease

## 2013-02-15 DIAGNOSIS — Z952 Presence of prosthetic heart valve: Secondary | ICD-10-CM

## 2013-02-15 DIAGNOSIS — E119 Type 2 diabetes mellitus without complications: Secondary | ICD-10-CM | POA: Diagnosis present

## 2013-02-15 DIAGNOSIS — Z01818 Encounter for other preprocedural examination: Secondary | ICD-10-CM

## 2013-02-15 DIAGNOSIS — Z7901 Long term (current) use of anticoagulants: Secondary | ICD-10-CM | POA: Insufficient documentation

## 2013-02-15 DIAGNOSIS — I6529 Occlusion and stenosis of unspecified carotid artery: Secondary | ICD-10-CM | POA: Diagnosis not present

## 2013-02-15 DIAGNOSIS — F17200 Nicotine dependence, unspecified, uncomplicated: Secondary | ICD-10-CM | POA: Insufficient documentation

## 2013-02-15 DIAGNOSIS — I739 Peripheral vascular disease, unspecified: Secondary | ICD-10-CM

## 2013-02-15 DIAGNOSIS — I10 Essential (primary) hypertension: Secondary | ICD-10-CM | POA: Insufficient documentation

## 2013-02-15 DIAGNOSIS — Z79899 Other long term (current) drug therapy: Secondary | ICD-10-CM | POA: Diagnosis not present

## 2013-02-15 DIAGNOSIS — E785 Hyperlipidemia, unspecified: Secondary | ICD-10-CM | POA: Diagnosis not present

## 2013-02-15 DIAGNOSIS — Z951 Presence of aortocoronary bypass graft: Secondary | ICD-10-CM | POA: Diagnosis not present

## 2013-02-15 DIAGNOSIS — L97509 Non-pressure chronic ulcer of other part of unspecified foot with unspecified severity: Secondary | ICD-10-CM | POA: Diagnosis present

## 2013-02-15 DIAGNOSIS — G4733 Obstructive sleep apnea (adult) (pediatric): Secondary | ICD-10-CM | POA: Diagnosis not present

## 2013-02-15 DIAGNOSIS — L98499 Non-pressure chronic ulcer of skin of other sites with unspecified severity: Principal | ICD-10-CM | POA: Insufficient documentation

## 2013-02-15 DIAGNOSIS — E663 Overweight: Secondary | ICD-10-CM | POA: Diagnosis not present

## 2013-02-15 DIAGNOSIS — Z954 Presence of other heart-valve replacement: Secondary | ICD-10-CM | POA: Diagnosis not present

## 2013-02-15 DIAGNOSIS — Z720 Tobacco use: Secondary | ICD-10-CM | POA: Diagnosis present

## 2013-02-15 DIAGNOSIS — I2510 Atherosclerotic heart disease of native coronary artery without angina pectoris: Secondary | ICD-10-CM | POA: Diagnosis present

## 2013-02-15 HISTORY — PX: LOWER EXTREMITY ANGIOGRAM: SHX5508

## 2013-02-15 LAB — GLUCOSE, CAPILLARY: Glucose-Capillary: 167 mg/dL — ABNORMAL HIGH (ref 70–99)

## 2013-02-15 SURGERY — ANGIOGRAM, LOWER EXTREMITY
Anesthesia: LOCAL

## 2013-02-15 MED ORDER — WARFARIN - PHARMACIST DOSING INPATIENT: Freq: Every day | Status: DC

## 2013-02-15 MED ORDER — ALBUTEROL SULFATE 2 MG PO TABS
2.0000 mg | ORAL_TABLET | Freq: Two times a day (BID) | ORAL | Status: DC
  Administered 2013-02-16: 2 mg via ORAL
  Filled 2013-02-15 (×2): qty 1

## 2013-02-15 MED ORDER — SODIUM CHLORIDE 0.9 % IV SOLN
INTRAVENOUS | Status: DC
  Administered 2013-02-15: 12:00:00 via INTRAVENOUS

## 2013-02-15 MED ORDER — POTASSIUM CHLORIDE CRYS ER 20 MEQ PO TBCR
20.0000 meq | EXTENDED_RELEASE_TABLET | Freq: Every day | ORAL | Status: DC
  Administered 2013-02-16: 20 meq via ORAL
  Filled 2013-02-15 (×2): qty 1

## 2013-02-15 MED ORDER — MIDAZOLAM HCL 2 MG/2ML IJ SOLN
INTRAMUSCULAR | Status: AC
  Filled 2013-02-15: qty 2

## 2013-02-15 MED ORDER — AMLODIPINE BESYLATE 5 MG PO TABS
5.0000 mg | ORAL_TABLET | Freq: Every day | ORAL | Status: DC
  Administered 2013-02-16: 5 mg via ORAL
  Filled 2013-02-15: qty 1

## 2013-02-15 MED ORDER — METOPROLOL SUCCINATE ER 25 MG PO TB24
125.0000 mg | ORAL_TABLET | Freq: Every day | ORAL | Status: DC
  Administered 2013-02-16: 125 mg via ORAL
  Filled 2013-02-15: qty 1

## 2013-02-15 MED ORDER — SODIUM CHLORIDE 0.9 % IV SOLN
INTRAVENOUS | Status: AC
  Filled 2013-02-15: qty 1000

## 2013-02-15 MED ORDER — MORPHINE SULFATE 2 MG/ML IJ SOLN: 1.0000 mg | INTRAMUSCULAR | Status: DC | PRN

## 2013-02-15 MED ORDER — DIAZEPAM 5 MG PO TABS
ORAL_TABLET | ORAL | Status: AC
  Administered 2013-02-15: 5 mg via ORAL
  Filled 2013-02-15: qty 1

## 2013-02-15 MED ORDER — FUROSEMIDE 40 MG PO TABS
40.0000 mg | ORAL_TABLET | Freq: Every day | ORAL | Status: DC
  Administered 2013-02-16: 40 mg via ORAL
  Filled 2013-02-15: qty 1

## 2013-02-15 MED ORDER — ATORVASTATIN CALCIUM 40 MG PO TABS
40.0000 mg | ORAL_TABLET | Freq: Every day | ORAL | Status: DC
  Administered 2013-02-15 – 2013-02-16 (×2): 40 mg via ORAL
  Filled 2013-02-15 (×2): qty 1

## 2013-02-15 MED ORDER — WARFARIN SODIUM 7.5 MG PO TABS
7.5000 mg | ORAL_TABLET | Freq: Once | ORAL | Status: DC
  Filled 2013-02-15: qty 1

## 2013-02-15 MED ORDER — PREGABALIN 50 MG PO CAPS
50.0000 mg | ORAL_CAPSULE | Freq: Every day | ORAL | Status: DC
Start: 2013-02-15 — End: 2013-02-16
  Administered 2013-02-15: 50 mg via ORAL
  Filled 2013-02-15: qty 1

## 2013-02-15 MED ORDER — SODIUM CHLORIDE 0.9 % IJ SOLN: 3.0000 mL | INTRAMUSCULAR | Status: DC | PRN

## 2013-02-15 MED ORDER — ASPIRIN 81 MG PO CHEW: 324.0000 mg | CHEWABLE_TABLET | ORAL | Status: AC

## 2013-02-15 MED ORDER — ASPIRIN 81 MG PO CHEW
CHEWABLE_TABLET | ORAL | Status: AC
  Administered 2013-02-15: 324 mg via ORAL
  Filled 2013-02-15: qty 4

## 2013-02-15 MED ORDER — IPRATROPIUM-ALBUTEROL 18-103 MCG/ACT IN AERO
1.0000 | INHALATION_SPRAY | Freq: Four times a day (QID) | RESPIRATORY_TRACT | Status: DC
  Filled 2013-02-15: qty 14.7

## 2013-02-15 MED ORDER — SODIUM CHLORIDE 0.9 % IV SOLN: INTRAVENOUS | Status: AC

## 2013-02-15 MED ORDER — WARFARIN SODIUM 7.5 MG PO TABS
7.5000 mg | ORAL_TABLET | Freq: Once | ORAL | Status: AC
  Administered 2013-02-15: 7.5 mg via ORAL
  Filled 2013-02-15: qty 1

## 2013-02-15 MED ORDER — ISOSORBIDE MONONITRATE ER 30 MG PO TB24
30.0000 mg | ORAL_TABLET | Freq: Every day | ORAL | Status: DC
  Administered 2013-02-16: 30 mg via ORAL
  Filled 2013-02-15: qty 1

## 2013-02-15 MED ORDER — DIAZEPAM 5 MG PO TABS: 5.0000 mg | ORAL_TABLET | ORAL | Status: AC

## 2013-02-15 MED ORDER — ACETAMINOPHEN 325 MG PO TABS: 650.0000 mg | ORAL_TABLET | ORAL | Status: DC | PRN

## 2013-02-15 MED ORDER — HEPARIN (PORCINE) IN NACL 100-0.45 UNIT/ML-% IJ SOLN
1550.0000 [IU]/h | INTRAMUSCULAR | Status: DC
  Administered 2013-02-15: 1250 [IU]/h via INTRAVENOUS
  Administered 2013-02-16 (×2): 1550 [IU]/h via INTRAVENOUS
  Filled 2013-02-15 (×4): qty 250

## 2013-02-15 MED ORDER — HEPARIN (PORCINE) IN NACL 2-0.9 UNIT/ML-% IJ SOLN
INTRAMUSCULAR | Status: AC
  Filled 2013-02-15: qty 1000

## 2013-02-15 MED ORDER — IPRATROPIUM-ALBUTEROL 20-100 MCG/ACT IN AERS
1.0000 | INHALATION_SPRAY | Freq: Four times a day (QID) | RESPIRATORY_TRACT | Status: DC
  Administered 2013-02-15 – 2013-02-16 (×2): 1 via RESPIRATORY_TRACT
  Filled 2013-02-15: qty 4

## 2013-02-15 MED ORDER — GLIMEPIRIDE 2 MG PO TABS
2.0000 mg | ORAL_TABLET | Freq: Two times a day (BID) | ORAL | Status: DC
  Administered 2013-02-15 – 2013-02-16 (×2): 2 mg via ORAL
  Filled 2013-02-15 (×4): qty 1

## 2013-02-15 MED ORDER — LINAGLIPTIN 5 MG PO TABS
5.0000 mg | ORAL_TABLET | Freq: Every day | ORAL | Status: DC
  Administered 2013-02-15 – 2013-02-16 (×2): 5 mg via ORAL
  Filled 2013-02-15 (×2): qty 1

## 2013-02-15 MED ORDER — FENOFIBRATE 160 MG PO TABS
160.0000 mg | ORAL_TABLET | Freq: Every day | ORAL | Status: DC
  Administered 2013-02-16: 160 mg via ORAL
  Filled 2013-02-15: qty 1

## 2013-02-15 MED ORDER — FENTANYL CITRATE 0.05 MG/ML IJ SOLN
INTRAMUSCULAR | Status: AC
  Filled 2013-02-15: qty 2

## 2013-02-15 MED ORDER — METOPROLOL SUCCINATE ER 50 MG PO TB24: 7500.0000 mg | ORAL_TABLET | Freq: Every day | ORAL | Status: DC

## 2013-02-15 MED ORDER — LIDOCAINE HCL (PF) 1 % IJ SOLN
INTRAMUSCULAR | Status: AC
  Filled 2013-02-15: qty 30

## 2013-02-15 MED ORDER — ONDANSETRON HCL 4 MG/2ML IJ SOLN: 4.0000 mg | Freq: Four times a day (QID) | INTRAMUSCULAR | Status: DC | PRN

## 2013-02-15 MED ORDER — ALLOPURINOL 300 MG PO TABS
300.0000 mg | ORAL_TABLET | Freq: Every day | ORAL | Status: DC
  Filled 2013-02-15: qty 1

## 2013-02-15 NOTE — Progress Notes (Signed)
ANTICOAGULATION CONSULT NOTE - Initial Consult  Pharmacy Consult for Heparin and Coumadin Indication: AVR  No Known Allergies  Patient Measurements: Height: 5\' 10"  (177.8 cm) Weight: 204 lb (92.534 kg) IBW/kg (Calculated) : 73 Heparin Dosing Weight: 92.5 kg  Vital Signs: Temp: 97.4 F (36.3 C) (06/30 1222) Temp src: Oral (06/30 1222) BP: 100/50 mmHg (06/30 1222) Pulse Rate: 46 (06/30 1354)  Labs:  Recent Labs  02/15/13 1200  LABPROT 13.3  INR 1.03    Estimated Creatinine Clearance: 63.6 ml/min (by C-G formula based on Cr of 1.2).   Medical History: Past Medical History  Diagnosis Date  . Diabetes mellitus 2007  . Hypertension   . Gout   . Hypercholesteremia   . Peripheral vascular disease     stents lower extremity  . Chronic pain     foot, hips  . Sleep apnea   . COPD (chronic obstructive pulmonary disease)   . 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 Chronic back pain   . Dysphagia   . Neuromuscular disorder   . Peripheral neuropathy     Medications:  Scheduled:  . [START ON 02/16/2013] albuterol  2 mg Oral BID  . albuterol-ipratropium  1 puff Inhalation QID  . [START ON 02/16/2013] allopurinol  300 mg Oral QHS  . [START ON 02/16/2013] amLODipine  5 mg Oral Daily  . atorvastatin  40 mg Oral q1800  . [START ON 02/16/2013] fenofibrate  160 mg Oral Daily  . [START ON 02/16/2013] furosemide  40 mg Oral Daily  . glimepiride  2 mg Oral BID  . [START ON 02/16/2013] isosorbide mononitrate  30 mg Oral Daily  . linagliptin  5 mg Oral Daily  . [START ON 02/16/2013] potassium chloride SA  20 mEq Oral Daily  . pregabalin  50 mg Oral QHS    Assessment: 72 yr old male s/p cath on heparin and coumadin for AVR.  Heparin is to be started 4 hours post sheath removal per Jones Skene, PA.  Sheath removal was at 1433 today.    Goal of Therapy:  INR=2-3 Heparin level 0.3-0.7 units/ml Monitor platelets by anticoagulation protocol: Yes   Plan:  Start IV heparin at  1250 units/hr at 1830 tonight. Check heparin level 8 hours after drip is started. Coumadin 7.5mg  po x 1 dose tonight. Daily PT/INR, heparin level/cbc.  Wendie Simmer, PharmD, BCPS Clinical Pharmacist  Pager: [phone]

## 2013-02-15 NOTE — CV Procedure (Signed)
Tanner Pena is a 72 y.o. male    [id number] LOCATION:  FACILITY: [facility]  PHYSICIAN: Nanetta Batty, M.D. 02/05/1941   DATE OF PROCEDURE:  02/15/2013  DATE OF DISCHARGE:   CARDIAC CATHETERIZATION     History obtained from chart review.The patient is a 72 year old, moderately overweight, married, Caucasian male father of 72, grandfather to 8 grandchildren. The patient is a cardiology patient of Dr. Devona Konig who I follow for peripheral vascular disease. He has a history of CAD status post bypass grafting in 1990 with a St. Jude AVR at that time. He had redo CABG in 1994. He has had percutaneous intervention to his vein grafts. I stented his SFAs bilaterally, as well as his right external iliac artery and had performed staged [facility] orbital rotational atherectomy. We have been following his carotid and lower extremity Dopplers on a serial basis. His other problems include continued tobacco abuse, hypertension, hyperlipidemia, type 2 diabetes, and obstructive sleep apnea. He just saw Dr. Tresa Endo back last month. Most recent stress test performed a year ago was nonischemic. His last Doppler study of his lower extremities performed a year ago revealed patent stents, and carotid Dopplers revealed mild to moderate left ICA stenosis. He is neurologically asymptomatic. He does complain of hip pain with ambulation which may be musculoskeletal versus related to claudication. He's had a 2-D echo recently that showed a well functioning aortic prosthesis with mild to moderate LV dysfunction. A Myoview stress test did show some anterolateral ischemia but this has been reviewed by Dr. Tresa Endo. He has a Nonhealing ulcer on his left great toe with some cyanosis of his left foot. The patient presents now for abdominal aortography with bilateral runoff and potential endovascular therapy for critical limb ischemia.    PROCEDURE DESCRIPTION:    The patient was brought to the second floor [facility]  lab in the postabsorptive state. He was premedicated with Valium 5 mg by mouth, IV Versed and fentanyl. His right groinwas prepped and shaved in usual sterile fashion. Xylocaine 1% was used for local anesthesia. A 5 French sheath was inserted into the right common femoral  artery using standard Seldinger technique.the 5 French pigtail catheter was used for abdominal aortography with bifemoral runoff using bolus chase digital subtraction step table technique. Visipaque dye was used for the entirety of the case. A retrograde aortic pressure was monitored during the case.  HEMODYNAMICS:    AO SYSTOLIC/AO DIASTOLIC: 144/48    ANGIOGRAPHIC RESULTS:   1: Distal abdominal aortogram/right lateral iliac angiogram: The iliac arteries widely patent, in particular the right external iliac artery stent was widely patent.  2: Left lower extremity-widely patent left SFA stent. There was moderate calcific disease below the stented segment in the adductor canal and above-knee popliteal artery. There is 1 vessel runoff via the posterior tibial which was diffusely diseased. The anterior tibial and peroneal were occluded. The posterior tibial terminated at the ankle. This appeared to be unchanged compared to prior in angiograms done 3 years ago.  3: Right lower extremity-the stents in the right SFA were widely patent. There was moderate calcific disease in the distal SFA/above-the-knee popliteal but did not appear to be obstructive. There was one vessel runoff via posterior tibial.  IMPRESSION:Tanner Pena's anatomy is unchanged from his prior angina then performed because 11. SFA stents are patent. He has severe infrapopliteal/tibial disease. The ulcer on his left great toe is in the anterior tibial territory which was noted to be occluded from many years.  His peroneals occluded and his posterior tibial terminates above the ankle. I do not think he has a good endovascular solution to promote healing. This is not heal  with aggressive local care he may require amputation. The sheath was removed and pressure was held on the groin to achieve hemostasis. The patient left the lab in stable condition. He was gently hydrated, discharged home later today with Lovenox bridging and will restart Coumadin for his aortic valve.  Runell Gess MD, [facility] 02/15/2013 2:15 PM

## 2013-02-15 NOTE — H&P (Signed)
    Pt was reexamined and existing H & P reviewed. No changes found.  Runell Gess, MD [facility] 02/15/2013 1:41 PM

## 2013-02-16 DIAGNOSIS — I739 Peripheral vascular disease, unspecified: Secondary | ICD-10-CM | POA: Diagnosis not present

## 2013-02-16 LAB — BASIC METABOLIC PANEL
BUN: 18 mg/dL (ref 6–23)
CO2: 26 mEq/L (ref 19–32)
Calcium: 9.4 mg/dL (ref 8.4–10.5)
Creatinine, Ser: 0.95 mg/dL (ref 0.50–1.35)
Glucose, Bld: 154 mg/dL — ABNORMAL HIGH (ref 70–99)

## 2013-02-16 LAB — GLUCOSE, CAPILLARY
Glucose-Capillary: 158 mg/dL — ABNORMAL HIGH (ref 70–99)
Glucose-Capillary: 141 mg/dL — ABNORMAL HIGH (ref 70–99)

## 2013-02-16 LAB — PROTIME-INR: INR: 1.02 (ref 0.00–1.49)

## 2013-02-16 LAB — HEPARIN LEVEL (UNFRACTIONATED): Heparin Unfractionated: 0.19 IU/mL — ABNORMAL LOW (ref 0.30–0.70)

## 2013-02-16 MED ORDER — ENOXAPARIN SODIUM 150 MG/ML ~~LOC~~ SOLN
1.5000 mg/kg | SUBCUTANEOUS | Status: DC
  Administered 2013-02-16: 145 mg via SUBCUTANEOUS
  Filled 2013-02-16: qty 1

## 2013-02-16 MED ORDER — ENOXAPARIN SODIUM 150 MG/ML ~~LOC~~ SOLN: 1.5000 mg/kg | Freq: Every day | SUBCUTANEOUS | Status: DC

## 2013-02-16 MED ORDER — OXYCODONE-ACETAMINOPHEN 5-325 MG PO TABS
1.0000 | ORAL_TABLET | Freq: Four times a day (QID) | ORAL | Status: DC | PRN
  Administered 2013-02-16: 1 via ORAL
  Filled 2013-02-16: qty 1

## 2013-02-16 MED ORDER — ENOXAPARIN SODIUM 150 MG/ML ~~LOC~~ SOLN: 100.0000 mg | Freq: Two times a day (BID) | SUBCUTANEOUS | Status: DC

## 2013-02-16 MED ORDER — WARFARIN SODIUM 7.5 MG PO TABS
7.5000 mg | ORAL_TABLET | Freq: Once | ORAL | Status: AC
  Administered 2013-02-16: 7.5 mg via ORAL
  Filled 2013-02-16 (×2): qty 1

## 2013-02-16 MED ORDER — OXYCODONE-ACETAMINOPHEN 10-325 MG PO TABS: 1.0000 | ORAL_TABLET | Freq: Four times a day (QID) | ORAL | Status: DC | PRN

## 2013-02-16 MED ORDER — OXYCODONE HCL 5 MG PO TABS
5.0000 mg | ORAL_TABLET | Freq: Four times a day (QID) | ORAL | Status: DC | PRN
  Administered 2013-02-16: 5 mg via ORAL
  Filled 2013-02-16: qty 1

## 2013-02-16 MED ORDER — IPRATROPIUM-ALBUTEROL 20-100 MCG/ACT IN AERS
1.0000 | INHALATION_SPRAY | Freq: Two times a day (BID) | RESPIRATORY_TRACT | Status: DC
  Filled 2013-02-16: qty 4

## 2013-02-16 NOTE — Consult Note (Addendum)
WOC consult Note Reason for Consult: Consult requested for left great toe chronic wound.  Pt has been followed as an outpatient by physician who has ordered special shoes and told pt to paint wound with betadine Q day.  This is appropriate plan of care.  Pt has hx poor circulation and is awaiting revascularization. Discussed topical care with patient who appears to be very well-informed regarding wound care. Left great toe .6X.6cm dry callous surrounding dry dark red wound bed covered by skin; no open wound or drainage.  Pt plans to follow-up with previous physician after discharge.  Continue present plan of care. Please re-consult if further assistance is needed.  Thank-you,  Cammie Mcgee MSN, RN, CWOCN, [place], CNS [phone]

## 2013-02-16 NOTE — Progress Notes (Signed)
Utilization review complete. Yunior Jain RN CCM Case Mgmt phone [phone] 

## 2013-02-16 NOTE — Discharge Summary (Signed)
Physician Discharge Summary  Patient ID: Tanner Pena MRN: [id number] DOB/AGE: Mar 25, 1941 72 y.o.  Admit date: 02/15/2013 Discharge date: 02/16/2013  Admission Diagnoses: Peripheral Vascular Disease/Nonhealing Left Foot Ulcer  Discharge Diagnoses:  Active Problems:   H/O aortic valve replacement   CAD (coronary artery disease) of artery bypass graft   Tobacco abuse   PVD (peripheral vascular disease)   DM2 (diabetes mellitus, type 2)   HTN (hypertension)   Hyperlipidemia LDL goal < 70   Discharged Condition: stable  Hospital Course: The patient is a 72 year old, moderately overweight, Caucasian male, who presented to [facility] to undergo a planned angiogram for PVD. The patient is a cardiology patient of Dr. Devona Konig and Dr. Allyson Sabal follows him for his peripheral vascular disease. He has a history of CAD, status post bypass grafting in 1990 with a St. Jude AVR. He had redo CABG in 1994. He has had percutaneous intervention to his vein grafts. Dr. Allyson Sabal stented his SFAs bilaterally, as well as his right external iliac artery and had performed staged [facility] orbital rotational atherectomy. [facility] has also been following his carotid and lower extremity Dopplers on a serial basis. His other problems include continued tobacco abuse, hypertension, hyperlipidemia, type 2 diabetes, and obstructive sleep apnea. He just saw Dr. Tresa Endo back several months ago. His most recent stress test performed a year ago was nonischemic. His last Doppler study of his lower extremities performed a year ago revealed patent stents, and carotid Dopplers revealed mild to moderate left ICA stenosis. He was recently found to have a nonhealing ulcer of his left great toe with some cyanosis of his left foot. He was referred for diagnostic PV angiogram. The procedure was performed by Dr. Allyson Sabal on 02/15/13. His anatomy was unchanged from his prior PV angio. His SFA stents were patent. He had severe infrapopliteal/tibial disease.  The ulcer on his left great toe is in the anterior tibial territory which was noted to be occluded from many years. His peroneals were occluded and his posterior tibial terminateed above the ankle. Dr. Allyson Sabal did not think there was a good endovascular solution to promote healing and he had recommended aggressive local care. Dr. Allyson Sabal felt that if not improved, he may require amputation. The patient left the cath lab in stable condition and was kept overnight for hydration. The right femoral access site remained stable and free of complication. WOC was consulted for wound care instructions/recommendations. He was last seen and examined by Dr. Rennis Golden, who determined he was stable for discharge home. He [facility] [place]. He was instructed to continue on Lovenox injections at home, due to a sub therapeutic INR. He will follow up at [facility] for an INR check 02/22/13 and again with Dr. Allyson Sabal on 03/04/13 to discuss further options.     Consults: Wound Care  Significant Diagnostic Studies:   PV Angiogram 02/15/13 HEMODYNAMICS:  AO SYSTOLIC/AO DIASTOLIC: 144/48  ANGIOGRAPHIC RESULTS:  1: Distal abdominal aortogram/right lateral iliac angiogram: The iliac arteries widely patent, in particular the right external iliac artery stent was widely patent.  2: Left lower extremity-widely patent left SFA stent. There was moderate calcific disease below the stented segment in the adductor canal and above-knee popliteal artery. There is 1 vessel runoff via the posterior tibial which was diffusely diseased. The anterior tibial and peroneal were occluded. The posterior tibial terminated at the ankle. This appeared to be unchanged compared to prior in angiograms done 3 years ago.  3: Right lower extremity-the  stents in the right SFA were widely patent. There was moderate calcific disease in the distal SFA/above-the-knee popliteal but did not appear to be obstructive. There was one vessel runoff via  posterior tibial.   Treatments: See Hospital Course  Discharge Exam: Blood pressure 117/47, pulse 60, temperature 97.5 F (36.4 C), temperature source Oral, resp. rate 18, height 5\' 10"  (1.778 m), weight 215 lb (97.523 kg), SpO2 93.00%.   Disposition: 01-Home or Self Care      Discharge Orders   Future Appointments Provider Department Dept Phone   03/04/2013 3:45 PM Runell Gess, MD [facility] [place] [phone]   Future Orders Complete By Expires     Diet - low sodium heart healthy  As directed     Driving Restrictions  As directed     Comments:      No driving for 3 days    Increase activity slowly  As directed     Lifting restrictions  As directed     Comments:      No lifting more than 1/2 gallon of milk for 3 days        Medication List    STOP taking these medications       enoxaparin 100 MG/ML injection  Commonly known as:  LOVENOX  Replaced by:  enoxaparin 150 MG/ML injection     losartan-hydrochlorothiazide 100-25 MG per tablet  Commonly known as:  HYZAAR      TAKE these medications       albuterol 2 MG tablet  Commonly known as:  PROVENTIL  Take 1 tablet by mouth 2 (two) times daily.     albuterol-ipratropium 18-103 MCG/ACT inhaler  Commonly known as:  COMBIVENT  Inhale 1 puff into the lungs 4 (four) times daily. Coughing/ Shortness of Breath     allopurinol 300 MG tablet  Commonly known as:  ZYLOPRIM  Take 300 mg by mouth at bedtime.     amLODipine 5 MG tablet  Commonly known as:  NORVASC  Take 5 mg by mouth daily.     aspirin 81 MG tablet  Take 81 mg by mouth at bedtime.     enoxaparin 150 MG/ML injection  Commonly known as:  LOVENOX  Inject 0.67 mLs (100 mg total) into the skin every 12 (twelve) hours.     fenofibrate 145 MG tablet  Commonly known as:  TRICOR  Take 1 tablet (145 mg total) by mouth daily.     fish oil-omega-3 fatty acids 1000 MG capsule  Take 1 capsule (1 g total) by mouth 2 (two)  times daily.     FREESTYLE TEST STRIPS test strip  Generic drug:  glucose blood     furosemide 80 MG tablet  Commonly known as:  LASIX  Take 40 mg by mouth daily.     glimepiride 2 MG tablet  Commonly known as:  AMARYL  Take 2 mg by mouth 2 (two) times daily.     metoprolol succinate 50 MG 24 hr tablet  Commonly known as:  TOPROL-XL  Take 125 mg by mouth daily. Take with or immediately following a meal.     oxyCODONE-acetaminophen 10-325 MG per tablet  Commonly known as:  PERCOCET  Take 1 tablet by mouth as needed.     potassium chloride SA 20 MEQ tablet  Commonly known as:  K-DUR,KLOR-CON  Take 20 mEq by mouth daily.     pregabalin 50 MG capsule  Commonly known as:  LYRICA  Take 50  mg by mouth at bedtime.     rosuvastatin 20 MG tablet  Commonly known as:  CRESTOR  Take 20 mg by mouth at bedtime.     sitaGLIPtin 100 MG tablet  Commonly known as:  JANUVIA  Take 100 mg by mouth daily.     warfarin 5 MG tablet  Commonly known as:  COUMADIN  Take 1 tablet (5 mg total) by mouth at bedtime. Patient takes 5 mg everyday except for Tues and Thurs when he takes 7.5 mg.       Follow-up Information   Follow up with [facility] On 02/22/2013. (INR check at 11:10 am)    Contact information:   [phone]      Follow up with Runell Gess, MD On 03/04/2013. (3:45 pm)    Contact information:   [address] [place] [address] [phone]      TIME SPENT ON DISCHARGE, INCLUDING PHYSICIAN TIME: >30 MINUTES  Signed: Allayne Butcher, PA-C 02/20/2013, 11:55 AM

## 2013-02-16 NOTE — Progress Notes (Signed)
ANTICOAGULATION CONSULT NOTE - Follow Up Consult  Pharmacy Consult for Heparin Indication: AVR  No Known Allergies  Patient Measurements: Height: 5\' 10"  (177.8 cm) Weight: 215 lb (97.523 kg) IBW/kg (Calculated) : 73 Heparin Dosing Weight: 92.5 kg  Vital Signs: Temp: 97.5 F (36.4 C) (07/01 1333) Temp src: Oral (07/01 1333) BP: 117/47 mmHg (07/01 1333) Pulse Rate: 60 (07/01 1333)  Labs:  Recent Labs  02/15/13 1200 02/16/13 0500 02/16/13 0523 02/16/13 1400  LABPROT 13.3 13.2  --   --   INR 1.03 1.02  --   --   HEPARINUNFRC  --  0.19*  --  0.34  CREATININE  --   --  0.95  --     Estimated Creatinine Clearance: 82.3 ml/min (by C-G formula based on Cr of 0.95).   Medical History: Past Medical History  Diagnosis Date  . Diabetes mellitus 2007  . Hypertension   . Gout   . Hypercholesteremia   . Peripheral vascular disease     stents lower extremity  . Chronic pain     foot, hips  . Sleep apnea   . COPD (chronic obstructive pulmonary disease)   . 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 Chronic back pain   . Dysphagia   . Neuromuscular disorder   . Peripheral neuropathy     Medications:  Scheduled:  . albuterol  2 mg Oral BID  . allopurinol  300 mg Oral QHS  . amLODipine  5 mg Oral Daily  . atorvastatin  40 mg Oral q1800  . fenofibrate  160 mg Oral Daily  . furosemide  40 mg Oral Daily  . glimepiride  2 mg Oral BID  . Ipratropium-Albuterol  1 puff Inhalation BID  . isosorbide mononitrate  30 mg Oral Daily  . linagliptin  5 mg Oral Daily  . metoprolol succinate  125 mg Oral Daily  . potassium chloride SA  20 mEq Oral Daily  . pregabalin  50 mg Oral QHS  . warfarin  7.5 mg Oral ONCE-1800  . Warfarin - Pharmacist Dosing Inpatient   Does not apply q1800    Assessment: 72 y/o male patient s/p cath on heparin and Coumadin for AVR. Heparin level is now therapeutic.  Goal of Therapy:  INR=2-3 Heparin level 0.3-0.7 units/ml Monitor platelets  by anticoagulation protocol: Yes   Plan:  Continue heparin gtt at 1550 units/hr and f/u in am.   Verlene Mayer, PharmD, BCPS Pager [phone]

## 2013-02-16 NOTE — Progress Notes (Signed)
ANTICOAGULATION CONSULT NOTE - Follow Up Consult  Pharmacy Consult for Heparin and Coumadin Indication: AVR  No Known Allergies  Patient Measurements: Height: 5\' 10"  (177.8 cm) Weight: 215 lb (97.523 kg) IBW/kg (Calculated) : 73 Heparin Dosing Weight: 92.5 kg  Vital Signs: Temp: 98.4 F (36.9 C) (07/01 0546) Temp src: Oral (07/01 0546) BP: 134/48 mmHg (07/01 0546) Pulse Rate: 57 (07/01 0546)  Labs:  Recent Labs  02/15/13 1200 02/16/13 0500 02/16/13 0523  LABPROT 13.3 13.2  --   INR 1.03 1.02  --   HEPARINUNFRC  --  0.19*  --   CREATININE  --   --  0.95    Estimated Creatinine Clearance: 82.3 ml/min (by C-G formula based on Cr of 0.95).   Medical History: Past Medical History  Diagnosis Date  . Diabetes mellitus 2007  . Hypertension   . Gout   . Hypercholesteremia   . Peripheral vascular disease     stents lower extremity  . Chronic pain     foot, hips  . Sleep apnea   . COPD (chronic obstructive pulmonary disease)   . 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 Chronic back pain   . Dysphagia   . Neuromuscular disorder   . Peripheral neuropathy     Medications:  Scheduled:  . albuterol  2 mg Oral BID  . allopurinol  300 mg Oral QHS  . amLODipine  5 mg Oral Daily  . atorvastatin  40 mg Oral q1800  . fenofibrate  160 mg Oral Daily  . furosemide  40 mg Oral Daily  . glimepiride  2 mg Oral BID  . Ipratropium-Albuterol  1 puff Inhalation QID  . isosorbide mononitrate  30 mg Oral Daily  . linagliptin  5 mg Oral Daily  . metoprolol succinate  125 mg Oral Daily  . potassium chloride SA  20 mEq Oral Daily  . pregabalin  50 mg Oral QHS  . Warfarin - Pharmacist Dosing Inpatient   Does not apply q1800    Assessment: 72 yr old male s/p cath on heparin and Coumadin for AVR. Heparin level (0.19) is below-goal on 1250 units/hr. No problem with line / infusion and no bleeding per RN. INR of 1.02 after Coumadin 7.5mg  po x 1.   Goal of Therapy:   INR=2-3 Heparin level 0.3-0.7 units/ml Monitor platelets by anticoagulation protocol: Yes   Plan:  1. Increase IV heparin to 1550 units/hr.  2. Heparin level in 8 hours 3. Coumadin 7.5mg  po today  Lorre Munroe, PharmD 02/16/2013 at 07:05

## 2013-02-16 NOTE — Progress Notes (Signed)
Pt. Seen and examined. Agree with the NP/PA-C note as written.  Doing well, but unable to be revascularized yesterday. Agree with wound care consult and discussed dietary changes and continued ambulation for his PAD. 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 Will need to re-start warfarin. Could be discharged today and bridged on lovenox at home (off-label) with warfarin and close follow-up re-check of INR next week.  I will d/w Dr. Allyson Sabal.  Follow-up with Dr. Allyson Sabal and Dr. Tresa Endo.  Chrystie Nose, MD, [facility] Attending Cardiologist [facility]

## 2013-02-16 NOTE — Progress Notes (Addendum)
D/c orders received;IV removed with gauze on, pt remains in stable condition, pt meds and instructions reviewed and given to pt; pt states he was on levonex prior to coming in and that he knows how to take injections, pt reminded to start taking his levonex (100mg  Q12 hrs) at home starting tomorrow (7/2) @ 8pm; pt d/c to home

## 2013-02-16 NOTE — Progress Notes (Signed)
[facility]  Subjective: No groin, back or flank pain. Ambulating w/o difficulty.  Objective: Vital signs in last 24 hours: Temp:  [97.4 F (36.3 C)-98.4 F (36.9 C)] 98.4 F (36.9 C) (07/01 0546) Pulse Rate:  [46-60] 60 (07/01 1003) Resp:  [16-18] 18 (07/01 0546) BP: (100-157)/(48-66) 113/66 mmHg (07/01 0949) SpO2:  [94 %-97 %] 97 % (07/01 0922) Weight:  [204 lb (92.534 kg)-215 lb (97.523 kg)] 215 lb (97.523 kg) (07/01 0546) Last BM Date: 02/15/13  Intake/Output from previous day: 06/30 0701 - 07/01 0700 In: 360 [P.O.:360] Out: 1400 [Urine:1400] Intake/Output this shift:    Medications Current Facility-Administered Medications  Medication Dose Route Frequency Provider Last Rate Last Dose  . acetaminophen (TYLENOL) tablet 650 mg  650 mg Oral Q4H PRN Runell Gess, MD      . albuterol (PROVENTIL) tablet 2 mg  2 mg Oral BID Wilburt Finlay, PA-C   2 mg at 02/16/13 1004  . allopurinol (ZYLOPRIM) tablet 300 mg  300 mg Oral QHS Wilburt Finlay, PA-C      . amLODipine (NORVASC) tablet 5 mg  5 mg Oral Daily Wilburt Finlay, PA-C   5 mg at 02/16/13 1004  . atorvastatin (LIPITOR) tablet 40 mg  40 mg Oral q1800 Wilburt Finlay, PA-C   40 mg at 02/15/13 1808  . fenofibrate tablet 160 mg  160 mg Oral Daily Wilburt Finlay, PA-C   160 mg at 02/16/13 1004  . furosemide (LASIX) tablet 40 mg  40 mg Oral Daily Wilburt Finlay, PA-C   40 mg at 02/16/13 1004  . glimepiride (AMARYL) tablet 2 mg  2 mg Oral BID Wilburt Finlay, PA-C   2 mg at 02/16/13 1004  . heparin ADULT infusion 100 units/mL (25000 units/250 mL)  1,550 Units/hr Intravenous Continuous Runell Gess, MD 15.5 mL/hr at 02/16/13 0706 1,550 Units/hr at 02/16/13 0706  . Ipratropium-Albuterol (COMBIVENT) respimat 1 puff  1 puff Inhalation BID Runell Gess, MD      . isosorbide mononitrate (IMDUR) 24 hr tablet 30 mg  30 mg Oral Daily Wilburt Finlay, PA-C   30 mg at 02/16/13 1004  . linagliptin (TRADJENTA) tablet 5 mg  5 mg Oral  Daily Wilburt Finlay, PA-C   5 mg at 02/16/13 1012  . metoprolol succinate (TOPROL-XL) 24 hr tablet 125 mg  125 mg Oral Daily Wilburt Finlay, PA-C   125 mg at 02/16/13 1003  . morphine 2 MG/ML injection 1 mg  1 mg Intravenous Q1H PRN Runell Gess, MD      . ondansetron [facility]) injection 4 mg  4 mg Intravenous Q6H PRN Runell Gess, MD      . potassium chloride SA (K-DUR,KLOR-CON) CR tablet 20 mEq  20 mEq Oral Daily Wilburt Finlay, PA-C   20 mEq at 02/16/13 1003  . pregabalin (LYRICA) capsule 50 mg  50 mg Oral QHS Wilburt Finlay, PA-C   50 mg at 02/15/13 2307  . warfarin (COUMADIN) tablet 7.5 mg  7.5 mg Oral ONCE-1800 Runell Gess, MD      . Warfarin - Pharmacist Dosing Inpatient   Does not apply [id number] Runell Gess, MD        PE: General appearance: alert, cooperative and no distress Lungs: clear to auscultation bilaterally Heart: regular rate and rhythm and crisp valve sounds Extremities: no LEE Pulses: 2+ radials, 0 DPs Skin: warm and dry Neurologic: Grossly normal  Lab Results:  No results found for this basename: WBC, HGB, HCT,  PLT,  in the last 72 hours BMET  Recent Labs  02/16/13 0523  NA 139  K 3.8  CL 106  CO2 26  GLUCOSE 154*  BUN 18  CREATININE 0.95  CALCIUM 9.4   PT/INR  Recent Labs  02/15/13 1200 02/16/13 0500  LABPROT 13.3 13.2  INR 1.03 1.02    Studies/Results:  PV Angio 02/15/13 HEMODYNAMICS:  AO SYSTOLIC/AO DIASTOLIC: 144/48  ANGIOGRAPHIC RESULTS:  1: Distal abdominal aortogram/right lateral iliac angiogram: The iliac arteries widely patent, in particular the right external iliac artery stent was widely patent.  2: Left lower extremity-widely patent left SFA stent. There was moderate calcific disease below the stented segment in the adductor canal and above-knee popliteal artery. There is 1 vessel runoff via the posterior tibial which was diffusely diseased. The anterior tibial and peroneal were occluded. The posterior tibial terminated at the  ankle. This appeared to be unchanged compared to prior in angiograms done 3 years ago.  3: Right lower extremity-the stents in the right SFA were widely patent. There was moderate calcific disease in the distal SFA/above-the-knee popliteal but did not appear to be obstructive. There was one vessel runoff via posterior tibial.  IMPRESSION:Mr. Tanner Pena's anatomy is unchanged from his prior angina then performed because 11. SFA stents are patent. He has severe infrapopliteal/tibial disease. The ulcer on his left great toe is in the anterior tibial territory which was noted to be occluded from many years. His peroneals occluded and his posterior tibial terminates above the ankle. I do not think he has a good endovascular solution to promote healing. This is not heal with aggressive local care he may require amputation. The sheath was removed and pressure was held on the groin to achieve hemostasis. The patient left the lab in stable condition. He was gently hydrated, discharged home later today with Lovenox bridging and will restart Coumadin for his aortic valve.  Runell Gess MD, [facility]  Assessment/Plan  Active Problems:   H/O aortic valve replacement   CAD (coronary artery disease) of artery bypass graft   Tobacco abuse   PVD (peripheral vascular disease)   DM2 (diabetes mellitus, type 2)   HTN (hypertension)   Hyperlipidemia LDL goal < 70  Plan: S/P PV angiogram to examine lower extremity vasculature, in the setting of a nonhealing ulcer on his left great toe. His anatomy was unchanged from his prior PV angio. His SFA stents were patent. He has severe infrapopliteal/tibial disease. The ulcer on his left great toe is in the anterior tibial territory which was noted to be occluded from many years. His peroneals are occluded and his posterior tibial terminates above the ankle. Dr. Allyson Sabal did not think there was a good endovascular solution to promote healing. He is recommending  aggressive local care.  If not improved, he may require amputation. Pt states that he was seen at a wound clinic in [place] prior to hospitalization, but was given no instruction for wound care and no follow-up. Will place [facility] consult for today. Right femoral access site is stable. ? Discharge once seen by Dr. Allyson Sabal and [facility].     LOS: 1 day    Brittainy M. Sharol Harness, PA-C 02/16/2013 10:23 AM

## 2013-02-17 ENCOUNTER — Other Ambulatory Visit: Payer: Self-pay | Admitting: *Deleted

## 2013-02-17 MED ORDER — ISOSORBIDE MONONITRATE ER 30 MG PO TB24: 30.0000 mg | ORAL_TABLET | Freq: Every day | ORAL | Status: DC

## 2013-02-17 NOTE — Telephone Encounter (Signed)
Refills authorized for Isosorbide

## 2013-02-22 ENCOUNTER — Ambulatory Visit: Payer: PRIVATE HEALTH INSURANCE | Admitting: Pharmacist Clinician (PhC)/ Clinical Pharmacy Specialist

## 2013-02-22 ENCOUNTER — Other Ambulatory Visit: Payer: Self-pay | Admitting: Cardiovascular Disease

## 2013-02-22 LAB — PROTIME-INR: Prothrombin Time: 22 seconds — ABNORMAL HIGH (ref 11.6–15.2)

## 2013-03-02 ENCOUNTER — Telehealth: Payer: Self-pay | Admitting: Pharmacist Clinician (PhC)/ Clinical Pharmacy Specialist

## 2013-03-02 ENCOUNTER — Ambulatory Visit (INDEPENDENT_AMBULATORY_CARE_PROVIDER_SITE_OTHER): Payer: Self-pay | Admitting: Pharmacist Clinician (PhC)/ Clinical Pharmacy Specialist

## 2013-03-02 DIAGNOSIS — Z7901 Long term (current) use of anticoagulants: Secondary | ICD-10-CM

## 2013-03-02 DIAGNOSIS — Z952 Presence of prosthetic heart valve: Secondary | ICD-10-CM

## 2013-03-02 DIAGNOSIS — Z954 Presence of other heart-valve replacement: Secondary | ICD-10-CM

## 2013-03-02 NOTE — Telephone Encounter (Signed)
Wants to know if you received lab work from 02-23-16? Need to know how take his medicine!

## 2013-03-04 ENCOUNTER — Ambulatory Visit (INDEPENDENT_AMBULATORY_CARE_PROVIDER_SITE_OTHER): Payer: PRIVATE HEALTH INSURANCE | Admitting: Cardiovascular Disease

## 2013-03-04 ENCOUNTER — Ambulatory Visit (INDEPENDENT_AMBULATORY_CARE_PROVIDER_SITE_OTHER): Payer: PRIVATE HEALTH INSURANCE | Admitting: Pharmacist Clinician (PhC)/ Clinical Pharmacy Specialist

## 2013-03-04 ENCOUNTER — Encounter: Payer: Self-pay | Admitting: Cardiovascular Disease

## 2013-03-04 VITALS — BP 140/62 | HR 56 | Ht 70.0 in | Wt 223.0 lb

## 2013-03-04 DIAGNOSIS — I998 Other disorder of circulatory system: Secondary | ICD-10-CM

## 2013-03-04 DIAGNOSIS — Z954 Presence of other heart-valve replacement: Secondary | ICD-10-CM

## 2013-03-04 DIAGNOSIS — I999 Unspecified disorder of circulatory system: Secondary | ICD-10-CM

## 2013-03-04 DIAGNOSIS — Z7901 Long term (current) use of anticoagulants: Secondary | ICD-10-CM

## 2013-03-04 LAB — POCT INR: INR: 3.3

## 2013-03-04 NOTE — Assessment & Plan Note (Signed)
Patient has history of bilateral SFA stenting. He developed a slowly healing wound on his left first toe. He underwent angiography by myself on 02/15/2013 revealing patent stents in his SFAs bilaterally, 60-70% segmental popliteal stenosis with one-vessel runoff on the left. His anterior tibial and perineal arteries were occluded and his posterior tibial had tandem 90% stenoses and terminated above the ankle. I do not think he had adequate flow to his foot the heel nor do I think that there was at a clear endovascular or surgical solution. His toe is slowly healing with conservative therapy. Should he have worsening of his ulcer I may send him to [facility] to have Dr. Hoy Finlay attempted recanalization of his tibial arteries. I will see him back in 3 months.

## 2013-03-04 NOTE — Patient Instructions (Addendum)
Your physician wants you to follow-up in: 3 months. You will receive a reminder letter in the mail two months in advance. If you don't receive a letter, please call our office to schedule the follow-up appointment.   

## 2013-03-04 NOTE — Progress Notes (Signed)
03/04/2013 Tanner Pena   [birth year]  [id number]  Primary Physician Cassell Smiles., MD Primary Cardiologist: Runell Gess MD Tanner Pena   HPI:  The patient is a 72 year old, moderately overweight, married, Caucasian male father of 4, grandfather to 8 grandchildren. The patient is a cardiology patient of Dr. Devona Konig who I follow for peripheral vascular disease. He has a history of CAD status post bypass grafting in 1990 with a St. Jude AVR at that time. He had redo CABG in 1994. He has had percutaneous intervention to his vein grafts. I stented his SFAs bilaterally, as well as his right external iliac artery and had performed staged Diamondback orbital rotational atherectomy. We have been following his carotid and lower extremity Dopplers on a serial basis. His other problems include continued tobacco abuse, hypertension, hyperlipidemia, type 2 diabetes, and obstructive sleep apnea. He just saw Dr. Tresa Endo back last month. Most recent stress test performed a year ago was nonischemic. His last Doppler study of his lower extremities performed a year ago revealed patent stents, and carotid Dopplers revealed mild to moderate left ICA stenosis. He is neurologically asymptomatic. He does complain of hip pain with ambulation which may be musculoskeletal versus related to claudication. He's had a 2-D echo recently that showed a well functioning aortic prosthesis with mild to moderate LV dysfunction. A Myoview stress test did show some anterolateral ischemia but this has been reviewed by Dr. Tresa Endo. He has a Nonhealing ulcer on his left great toe with some cyanosis of his left foot. The patient head abdominal aortography with bilateral runoff and potential endovascular therapy for critical limb ischemia on 02/15/13 revealing patent SFA stents bilaterally with severe infrapopliteal disease. He had one vessel runoff via a diseased posterior tibial on the left. He was discharged home with  Lovenox bridging and Tanner Pena is currently back on Coumadin for his St. Jude aortic valve. His left great toe is slowly healing and no longer is painful.    Current Outpatient Prescriptions  Medication Sig Dispense Refill  . albuterol (PROVENTIL) 2 MG tablet Take 1 tablet by mouth 2 (two) times daily.      [name] albuterol-ipratropium (COMBIVENT) 18-103 MCG/ACT inhaler Inhale 1 puff into the lungs 4 (four) times daily. Coughing/ Shortness of Breath      . allopurinol (ZYLOPRIM) 300 MG tablet Take 300 mg by mouth at bedtime.       [name] amLODipine (NORVASC) 5 MG tablet Take 5 mg by mouth daily.       [name] aspirin 81 MG tablet Take 81 mg by mouth at bedtime.       . fenofibrate (TRICOR) 145 MG tablet Take 1 tablet (145 mg total) by mouth daily.  30 tablet  6  . fish oil-omega-3 fatty acids 1000 MG capsule Take 1 capsule (1 g total) by mouth 2 (two) times daily.      [name] FREESTYLE TEST STRIPS test strip       . furosemide (LASIX) 80 MG tablet Take 40 mg by mouth daily.       [name] glimepiride (AMARYL) 2 MG tablet Take 2 mg by mouth 2 (two) times daily.       . isosorbide mononitrate (IMDUR) 30 MG 24 hr tablet Take 1 tablet (30 mg total) by mouth daily.  30 tablet  5  . metoprolol succinate (TOPROL-XL) 50 MG 24 hr tablet Take 125 mg by mouth daily. Take with or immediately following a meal.      . oxyCODONE-acetaminophen (PERCOCET)  10-325 MG per tablet Take 1 tablet by mouth as needed.      . potassium chloride SA (K-DUR,KLOR-CON) 20 MEQ tablet Take 20 mEq by mouth daily.        . pregabalin (LYRICA) 50 MG capsule Take 50 mg by mouth at bedtime.       . rosuvastatin (CRESTOR) 20 MG tablet Take 20 mg by mouth at bedtime.       . sitaGLIPtin (JANUVIA) 100 MG tablet Take 100 mg by mouth daily.       [name] warfarin (COUMADIN) 5 MG tablet Take 1 tablet (5 mg total) by mouth at bedtime. Patient takes 5 mg everyday except for Tues and Thurs when he takes 7.5 mg.  100 tablet  2   No current facility-administered medications for  this visit.    No Known Allergies  History   Social History  . Marital Status: Legally Separated    Spouse Name: N/A    Number of Children: 5  . Years of Education: N/A   Occupational History  . retired     Air traffic controller   Social History Main Topics  . Smoking status: Current Every Day Smoker -- 1.00 packs/day for 55 years    Types: Cigarettes    Start date: 08/19/1953  . Smokeless tobacco: Former Neurosurgeon    Types: Chew    Quit date: 08/20/1995     Comment: Has quit on 3 occasions. Counseling given today 5-10 minutes   I am more than likely going to quit "  . Alcohol Use: Yes     Comment: socially, sometimes 12 ounce beer daily, may go month without/no whiskey  . Drug Use: No  . Sexually Active: Not on file   Other Topics Concern  . Not on file   Social History Narrative   Has 3 daughters   Has 2 sons     Review of Systems: General: negative for chills, fever, night sweats or weight changes.  Cardiovascular: negative for chest pain, dyspnea on exertion, edema, orthopnea, palpitations, paroxysmal nocturnal dyspnea or shortness of breath Dermatological: negative for rash Respiratory: negative for cough or wheezing Urologic: negative for hematuria Abdominal: negative for nausea, vomiting, diarrhea, bright red blood per rectum, melena, or hematemesis Neurologic: negative for visual changes, syncope, or dizziness All other systems reviewed and are otherwise negative except as noted above.    Blood pressure 140/62, pulse 56, height 5\' 10"  (1.778 m), weight 223 lb (101.152 kg).  General appearance: alert and no distress Neck: no adenopathy, no carotid bruit, no JVD, supple, symmetrical, trachea midline and thyroid not enlarged, symmetric, no tenderness/mass/nodules Lungs: clear to auscultation bilaterally Heart: crisp prosthetic valve sounds Extremities: extremities normal, atraumatic, no cyanosis or edema and left great toe ischemic ulcer is slowly healing  EKG not  performed today  ASSESSMENT AND PLAN:   Critical lower limb ischemia Patient has history of bilateral SFA stenting. He developed a slowly healing wound on his left first toe. He underwent angiography by myself on 02/15/2013 revealing patent stents in his SFAs bilaterally, 60-70% segmental popliteal stenosis with one-vessel runoff on the left. His anterior tibial and perineal arteries were occluded and his posterior tibial had tandem 90% stenoses and terminated above the ankle. I do not think he had adequate flow to his foot the heel nor do I think that there was at a clear endovascular or surgical solution. His toe is slowly healing with conservative therapy. Should he have worsening of his ulcer I may send him  to [facility] to have Dr. Hoy Finlay attempted recanalization of his tibial arteries. I will see him back in 3 months.      Runell Gess MD FACP,FACC,FAHA, [facility] 03/04/2013 5:04 PM

## 2013-04-07 ENCOUNTER — Other Ambulatory Visit: Payer: Self-pay | Admitting: Cardiovascular Disease

## 2013-04-08 LAB — PROTIME-INR
INR: 3.66 — ABNORMAL HIGH (ref <1.50)
Prothrombin Time: 34.5 seconds — ABNORMAL HIGH (ref 11.6–15.2)

## 2013-04-15 ENCOUNTER — Ambulatory Visit (INDEPENDENT_AMBULATORY_CARE_PROVIDER_SITE_OTHER): Payer: Self-pay | Admitting: Pharmacist Clinician (PhC)/ Clinical Pharmacy Specialist

## 2013-04-15 ENCOUNTER — Telehealth: Payer: Self-pay | Admitting: Pharmacist Clinician (PhC)/ Clinical Pharmacy Specialist

## 2013-04-15 DIAGNOSIS — Z954 Presence of other heart-valve replacement: Secondary | ICD-10-CM

## 2013-04-15 DIAGNOSIS — Z952 Presence of prosthetic heart valve: Secondary | ICD-10-CM

## 2013-04-15 DIAGNOSIS — Z7901 Long term (current) use of anticoagulants: Secondary | ICD-10-CM

## 2013-04-15 NOTE — Telephone Encounter (Signed)
Daughter Joice Lofts is calling to get results of INR.

## 2013-04-15 NOTE — Telephone Encounter (Signed)
See anticoag note

## 2013-04-23 ENCOUNTER — Other Ambulatory Visit: Payer: Self-pay | Admitting: *Deleted

## 2013-04-23 MED ORDER — ROSUVASTATIN CALCIUM 20 MG PO TABS: 20.0000 mg | ORAL_TABLET | Freq: Every day | ORAL | Status: DC

## 2013-04-23 NOTE — Telephone Encounter (Signed)
Rx was sent to pharmacy electronically. 

## 2013-05-11 ENCOUNTER — Other Ambulatory Visit: Payer: Self-pay | Admitting: Cardiovascular Disease

## 2013-05-11 LAB — PROTIME-INR
INR: 3.9 — ABNORMAL HIGH
Prothrombin Time: 36.2 s — ABNORMAL HIGH (ref 11.6–15.2)

## 2013-05-14 ENCOUNTER — Ambulatory Visit (INDEPENDENT_AMBULATORY_CARE_PROVIDER_SITE_OTHER): Payer: PRIVATE HEALTH INSURANCE | Admitting: Pharmacist Clinician (PhC)/ Clinical Pharmacy Specialist

## 2013-05-14 DIAGNOSIS — Z954 Presence of other heart-valve replacement: Secondary | ICD-10-CM

## 2013-05-14 DIAGNOSIS — Z7901 Long term (current) use of anticoagulants: Secondary | ICD-10-CM

## 2013-05-14 DIAGNOSIS — Z952 Presence of prosthetic heart valve: Secondary | ICD-10-CM

## 2013-05-31 ENCOUNTER — Other Ambulatory Visit (HOSPITAL_COMMUNITY): Payer: Self-pay | Admitting: Internal Medicine

## 2013-05-31 DIAGNOSIS — Z139 Encounter for screening, unspecified: Secondary | ICD-10-CM

## 2013-06-03 ENCOUNTER — Ambulatory Visit (HOSPITAL_COMMUNITY)
Admission: RE | Admit: 2013-06-03 | Discharge: 2013-06-03 | Disposition: A | Discharge status: Home / Self Care | Payer: PRIVATE HEALTH INSURANCE | Source: Ambulatory Visit | Attending: Internal Medicine | Admitting: Internal Medicine

## 2013-06-03 DIAGNOSIS — Z951 Presence of aortocoronary bypass graft: Secondary | ICD-10-CM | POA: Insufficient documentation

## 2013-06-03 DIAGNOSIS — I714 Abdominal aortic aneurysm, without rupture, unspecified: Secondary | ICD-10-CM | POA: Insufficient documentation

## 2013-06-03 DIAGNOSIS — I10 Essential (primary) hypertension: Secondary | ICD-10-CM | POA: Insufficient documentation

## 2013-06-03 DIAGNOSIS — E119 Type 2 diabetes mellitus without complications: Secondary | ICD-10-CM | POA: Insufficient documentation

## 2013-06-03 DIAGNOSIS — Z139 Encounter for screening, unspecified: Secondary | ICD-10-CM

## 2013-06-16 ENCOUNTER — Ambulatory Visit: Payer: PRIVATE HEALTH INSURANCE | Admitting: Cardiovascular Disease

## 2013-06-22 ENCOUNTER — Other Ambulatory Visit: Payer: Self-pay | Admitting: Cardiovascular Disease

## 2013-06-22 LAB — PROTIME-INR: Prothrombin Time: 29.2 seconds — ABNORMAL HIGH (ref 11.6–15.2)

## 2013-07-01 ENCOUNTER — Ambulatory Visit (INDEPENDENT_AMBULATORY_CARE_PROVIDER_SITE_OTHER): Payer: PRIVATE HEALTH INSURANCE | Admitting: Cardiovascular Disease

## 2013-07-01 ENCOUNTER — Encounter: Payer: Self-pay | Admitting: Cardiovascular Disease

## 2013-07-01 ENCOUNTER — Ambulatory Visit (INDEPENDENT_AMBULATORY_CARE_PROVIDER_SITE_OTHER): Payer: Self-pay | Admitting: Pharmacist Clinician (PhC)/ Clinical Pharmacy Specialist

## 2013-07-01 VITALS — BP 122/54 | HR 60 | Ht 70.5 in | Wt 223.6 lb

## 2013-07-01 DIAGNOSIS — Z7901 Long term (current) use of anticoagulants: Secondary | ICD-10-CM

## 2013-07-01 DIAGNOSIS — R079 Chest pain, unspecified: Secondary | ICD-10-CM

## 2013-07-01 DIAGNOSIS — Z954 Presence of other heart-valve replacement: Secondary | ICD-10-CM

## 2013-07-01 DIAGNOSIS — Z952 Presence of prosthetic heart valve: Secondary | ICD-10-CM

## 2013-07-01 DIAGNOSIS — I25810 Atherosclerosis of coronary artery bypass graft(s) without angina pectoris: Secondary | ICD-10-CM

## 2013-07-01 DIAGNOSIS — I999 Unspecified disorder of circulatory system: Secondary | ICD-10-CM

## 2013-07-01 DIAGNOSIS — I998 Other disorder of circulatory system: Secondary | ICD-10-CM

## 2013-07-01 NOTE — Progress Notes (Signed)
07/01/2013 Tanner Pena   1941/06/27  [id number]  Primary Physician Cassell Smiles., MD Primary Cardiologist: Runell Gess MD [name]   HPI:  The patient is a 72 year old, moderately overweight, married, Caucasian male father of 4, grandfather to 8 grandchildren. The patient is a cardiology patient of Dr. Devona Konig who I follow for peripheral vascular disease. He has a history of CAD status post bypass grafting in 1990 with a St. Jude AVR at that time. He had redo CABG in 1994. He has had percutaneous intervention to his vein grafts. I stented his SFAs bilaterally, as well as his right external iliac artery and had performed staged Diamondback orbital rotational atherectomy. We have been following his carotid and lower extremity Dopplers on a serial basis. His other problems include continued tobacco abuse, hypertension, hyperlipidemia, type 2 diabetes, and obstructive sleep apnea. He just saw Dr. Tresa Endo back last month. Most recent stress test performed a year ago was nonischemic. His last Doppler study of his lower extremities performed a year ago revealed patent stents, and carotid Dopplers revealed mild to moderate left ICA stenosis. He is neurologically asymptomatic. He does complain of hip pain with ambulation which may be musculoskeletal versus related to claudication. He's had a 2-D echo recently that showed a well functioning aortic prosthesis with mild to moderate LV dysfunction. A Myoview stress test did show some anterolateral ischemia but this has been reviewed by Dr. Tresa Endo. He has a Nonhealing ulcer on his left great toe with some cyanosis of his left foot. The patient head abdominal aortography with bilateral runoff and potential endovascular therapy for critical limb ischemia on 02/15/13 revealing patent SFA stents bilaterally with severe infrapopliteal disease. He had one vessel runoff via a diseased posterior tibial on the left. Since I saw him back in the  office 03/04/13 his left great toe ulcer has been slowly healing. There is no longer painful. He is able to wear a shoe.   Current Outpatient Prescriptions  Medication Sig Dispense Refill  . albuterol (PROVENTIL) 2 MG tablet Take 1 tablet by mouth 2 (two) times daily.      [name] albuterol-ipratropium (COMBIVENT) 18-103 MCG/ACT inhaler Inhale 1 puff into the lungs 4 (four) times daily. Coughing/ Shortness of Breath      . allopurinol (ZYLOPRIM) 300 MG tablet Take 300 mg by mouth at bedtime.       [name] amLODipine (NORVASC) 5 MG tablet Take 5 mg by mouth daily.       [name] aspirin 81 MG tablet Take 81 mg by mouth at bedtime.       . fenofibrate (TRICOR) 145 MG tablet Take 1 tablet (145 mg total) by mouth daily.  30 tablet  6  . fish oil-omega-3 fatty acids 1000 MG capsule Take 1 capsule (1 g total) by mouth 2 (two) times daily.      [name] FREESTYLE TEST STRIPS test strip       . furosemide (LASIX) 80 MG tablet Take 40 mg by mouth daily.       [name] glimepiride (AMARYL) 2 MG tablet Take 2 mg by mouth 2 (two) times daily.       . isosorbide mononitrate (IMDUR) 30 MG 24 hr tablet Take 1 tablet (30 mg total) by mouth daily.  30 tablet  5  . metoprolol succinate (TOPROL-XL) 50 MG 24 hr tablet Take 125 mg by mouth daily. Take with or immediately following a meal.      . oxyCODONE-acetaminophen (PERCOCET) 10-325  MG per tablet Take 1 tablet by mouth as needed.      . potassium chloride SA (K-DUR,KLOR-CON) 20 MEQ tablet Take 20 mEq by mouth daily.        . pregabalin (LYRICA) 50 MG capsule Take 50 mg by mouth at bedtime.       . rosuvastatin (CRESTOR) 20 MG tablet Take 1 tablet (20 mg total) by mouth at bedtime.  30 tablet  11  . sitaGLIPtin (JANUVIA) 100 MG tablet Take 100 mg by mouth daily.       [name] warfarin (COUMADIN) 5 MG tablet Take 1 tablet (5 mg total) by mouth at bedtime. Patient takes 5 mg everyday except for Tues and Thurs when he takes 7.5 mg.  100 tablet  2   No current facility-administered medications for this  visit.    No Known Allergies  History   Social History  . Marital Status: Legally Separated    Spouse Name: N/A    Number of Children: 5  . Years of Education: N/A   Occupational History  . retired     Air traffic controller   Social History Main Topics  . Smoking status: Current Every Day Smoker -- 1.00 packs/day for 55 years    Types: Cigarettes    Start date: 08/19/1953  . Smokeless tobacco: Former Neurosurgeon    Types: Chew    Quit date: 08/20/1995     Comment: Has quit on 3 occasions. Counseling given today 5-10 minutes   I am more than likely going to quit "  . Alcohol Use: Yes     Comment: socially, sometimes 12 ounce beer daily, may go month without/no whiskey  . Drug Use: No  . Sexual Activity: Not on file   Other Topics Concern  . Not on file   Social History Narrative   Has 3 daughters   Has 2 sons     Review of Systems: General: negative for chills, fever, night sweats or weight changes.  Cardiovascular: negative for chest pain, dyspnea on exertion, edema, orthopnea, palpitations, paroxysmal nocturnal dyspnea or shortness of breath Dermatological: negative for rash Respiratory: negative for cough or wheezing Urologic: negative for hematuria Abdominal: negative for nausea, vomiting, diarrhea, bright red blood per rectum, melena, or hematemesis Neurologic: negative for visual changes, syncope, or dizziness All other systems reviewed and are otherwise negative except as noted above.    Blood pressure 122/54, pulse 60, height 5' 10.5" (1.791 m), weight 223 lb 9.6 oz (101.424 kg).  General appearance: alert and no distress Neck: no adenopathy, no carotid bruit, no JVD, supple, symmetrical, trachea midline and thyroid not enlarged, symmetric, no tenderness/mass/nodules Lungs: clear to auscultation bilaterally Heart: prosthetic aortic valve sounds Extremities: extremities normal, atraumatic, no cyanosis or edema and left toe ulcer is almost completely healed  EKG  normal sinus rhythm at 60 with a bundle branch block/IVCD, unchanged from his prior EKG  ASSESSMENT AND PLAN:   Critical lower limb ischemia As a child and has had stents in his SFAs bilaterally as well as in his right external iliac artery. An angiogram him 02/15/13 revealing patent stents with moderate disease in his above-the-knee popliteal arteries bilaterally and 0 vessel runoff. He was sent back to me by Dr. Theola Sequin, podiatrist a refill, because of a slowly healing ulcer on his left great toe. This has slowly improved over time and no longer is painful. He is able to wear a shoe. Recent arterial Dopplers performed in our office 02/12/13 revealed his stents to  be widely patent.      Runell Gess MD FACP,FACC,FAHA, [facility] 07/01/2013 11:43 AM

## 2013-07-01 NOTE — Assessment & Plan Note (Signed)
As a child and has had stents in his SFAs bilaterally as well as in his right external iliac artery. An angiogram him 02/15/13 revealing patent stents with moderate disease in his above-the-knee popliteal arteries bilaterally and 0 vessel runoff. He was sent back to me by Dr. Theola Sequin, podiatrist a refill, because of a slowly healing ulcer on his left great toe. This has slowly improved over time and no longer is painful. He is able to wear a shoe. Recent arterial Dopplers performed in our office 02/12/13 revealed his stents to be widely patent.

## 2013-07-01 NOTE — Patient Instructions (Signed)
Follow up with Dr Allyson Sabal as needed.  You are due for a routine follow up with Dr Tresa Endo

## 2013-07-09 ENCOUNTER — Telehealth (HOSPITAL_COMMUNITY): Payer: Self-pay | Admitting: *Deleted

## 2013-07-12 ENCOUNTER — Other Ambulatory Visit: Payer: Self-pay | Admitting: *Deleted

## 2013-07-12 ENCOUNTER — Ambulatory Visit: Payer: PRIVATE HEALTH INSURANCE | Admitting: Cardiovascular Disease

## 2013-07-12 MED ORDER — METOPROLOL SUCCINATE ER 50 MG PO TB24: ORAL_TABLET | ORAL | Status: DC

## 2013-07-19 ENCOUNTER — Other Ambulatory Visit: Payer: Self-pay | Admitting: *Deleted

## 2013-07-19 MED ORDER — FENOFIBRATE 145 MG PO TABS: 145.0000 mg | ORAL_TABLET | Freq: Every day | ORAL | Status: DC

## 2013-07-23 ENCOUNTER — Telehealth (HOSPITAL_COMMUNITY): Payer: Self-pay | Admitting: *Deleted

## 2013-07-27 ENCOUNTER — Ambulatory Visit: Payer: PRIVATE HEALTH INSURANCE | Admitting: Cardiovascular Disease

## 2013-08-10 ENCOUNTER — Emergency Department (HOSPITAL_COMMUNITY): Payer: PRIVATE HEALTH INSURANCE

## 2013-08-10 ENCOUNTER — Encounter (HOSPITAL_COMMUNITY): Payer: Self-pay | Admitting: Emergency Medicine

## 2013-08-10 ENCOUNTER — Inpatient Hospital Stay (HOSPITAL_COMMUNITY)
Admission: EM | Admit: 2013-08-10 | Discharge: 2013-08-16 | DRG: 304 | Disposition: A | Discharge status: Home / Self Care | Payer: PRIVATE HEALTH INSURANCE | Attending: Family Medicine | Admitting: Family Medicine

## 2013-08-10 DIAGNOSIS — Z91199 Patient's noncompliance with other medical treatment and regimen due to unspecified reason: Secondary | ICD-10-CM

## 2013-08-10 DIAGNOSIS — R1310 Dysphagia, unspecified: Secondary | ICD-10-CM | POA: Diagnosis present

## 2013-08-10 DIAGNOSIS — Z951 Presence of aortocoronary bypass graft: Secondary | ICD-10-CM

## 2013-08-10 DIAGNOSIS — I2589 Other forms of chronic ischemic heart disease: Secondary | ICD-10-CM | POA: Diagnosis present

## 2013-08-10 DIAGNOSIS — I998 Other disorder of circulatory system: Secondary | ICD-10-CM

## 2013-08-10 DIAGNOSIS — R079 Chest pain, unspecified: Secondary | ICD-10-CM | POA: Diagnosis present

## 2013-08-10 DIAGNOSIS — E785 Hyperlipidemia, unspecified: Secondary | ICD-10-CM

## 2013-08-10 DIAGNOSIS — Z954 Presence of other heart-valve replacement: Secondary | ICD-10-CM

## 2013-08-10 DIAGNOSIS — I447 Left bundle-branch block, unspecified: Secondary | ICD-10-CM

## 2013-08-10 DIAGNOSIS — Z800 Family history of malignant neoplasm of digestive organs: Secondary | ICD-10-CM

## 2013-08-10 DIAGNOSIS — Z952 Presence of prosthetic heart valve: Secondary | ICD-10-CM

## 2013-08-10 DIAGNOSIS — G4733 Obstructive sleep apnea (adult) (pediatric): Secondary | ICD-10-CM | POA: Diagnosis present

## 2013-08-10 DIAGNOSIS — I200 Unstable angina: Secondary | ICD-10-CM

## 2013-08-10 DIAGNOSIS — J449 Chronic obstructive pulmonary disease, unspecified: Secondary | ICD-10-CM

## 2013-08-10 DIAGNOSIS — J4489 Other specified chronic obstructive pulmonary disease: Secondary | ICD-10-CM | POA: Diagnosis present

## 2013-08-10 DIAGNOSIS — R1319 Other dysphagia: Secondary | ICD-10-CM

## 2013-08-10 DIAGNOSIS — Z7982 Long term (current) use of aspirin: Secondary | ICD-10-CM

## 2013-08-10 DIAGNOSIS — Z79899 Other long term (current) drug therapy: Secondary | ICD-10-CM

## 2013-08-10 DIAGNOSIS — I255 Ischemic cardiomyopathy: Secondary | ICD-10-CM

## 2013-08-10 DIAGNOSIS — I25810 Atherosclerosis of coronary artery bypass graft(s) without angina pectoris: Secondary | ICD-10-CM

## 2013-08-10 DIAGNOSIS — M549 Dorsalgia, unspecified: Secondary | ICD-10-CM | POA: Diagnosis present

## 2013-08-10 DIAGNOSIS — Z1211 Encounter for screening for malignant neoplasm of colon: Secondary | ICD-10-CM

## 2013-08-10 DIAGNOSIS — Z9861 Coronary angioplasty status: Secondary | ICD-10-CM

## 2013-08-10 DIAGNOSIS — Z7901 Long term (current) use of anticoagulants: Secondary | ICD-10-CM

## 2013-08-10 DIAGNOSIS — L97509 Non-pressure chronic ulcer of other part of unspecified foot with unspecified severity: Secondary | ICD-10-CM | POA: Diagnosis present

## 2013-08-10 DIAGNOSIS — I70229 Atherosclerosis of native arteries of extremities with rest pain, unspecified extremity: Secondary | ICD-10-CM

## 2013-08-10 DIAGNOSIS — S92309A Fracture of unspecified metatarsal bone(s), unspecified foot, initial encounter for closed fracture: Secondary | ICD-10-CM

## 2013-08-10 DIAGNOSIS — I739 Peripheral vascular disease, unspecified: Secondary | ICD-10-CM

## 2013-08-10 DIAGNOSIS — I2510 Atherosclerotic heart disease of native coronary artery without angina pectoris: Secondary | ICD-10-CM

## 2013-08-10 DIAGNOSIS — Z9119 Patient's noncompliance with other medical treatment and regimen: Secondary | ICD-10-CM

## 2013-08-10 DIAGNOSIS — I160 Hypertensive urgency: Secondary | ICD-10-CM

## 2013-08-10 DIAGNOSIS — G609 Hereditary and idiopathic neuropathy, unspecified: Secondary | ICD-10-CM | POA: Diagnosis present

## 2013-08-10 DIAGNOSIS — I5043 Acute on chronic combined systolic (congestive) and diastolic (congestive) heart failure: Secondary | ICD-10-CM

## 2013-08-10 DIAGNOSIS — I10 Essential (primary) hypertension: Principal | ICD-10-CM

## 2013-08-10 DIAGNOSIS — I509 Heart failure, unspecified: Secondary | ICD-10-CM | POA: Diagnosis present

## 2013-08-10 DIAGNOSIS — E119 Type 2 diabetes mellitus without complications: Secondary | ICD-10-CM

## 2013-08-10 DIAGNOSIS — F17200 Nicotine dependence, unspecified, uncomplicated: Secondary | ICD-10-CM | POA: Diagnosis present

## 2013-08-10 DIAGNOSIS — Z720 Tobacco use: Secondary | ICD-10-CM | POA: Diagnosis present

## 2013-08-10 DIAGNOSIS — G8929 Other chronic pain: Secondary | ICD-10-CM | POA: Diagnosis present

## 2013-08-10 DIAGNOSIS — M109 Gout, unspecified: Secondary | ICD-10-CM | POA: Diagnosis present

## 2013-08-10 LAB — COMPREHENSIVE METABOLIC PANEL
ALT: 16 U/L (ref 0–53)
AST: 22 U/L (ref 0–37)
Alkaline Phosphatase: 37 U/L — ABNORMAL LOW (ref 39–117)
CO2: 25 mEq/L (ref 19–32)
Chloride: 102 mEq/L (ref 96–112)
GFR calc non Af Amer: 83 mL/min — ABNORMAL LOW (ref >90)
Potassium: 4 mEq/L (ref 3.5–5.1)
Sodium: 141 mEq/L (ref 135–145)
Total Bilirubin: 0.5 mg/dL (ref 0.3–1.2)

## 2013-08-10 LAB — CBC
MCHC: 33.5 g/dL (ref 30.0–36.0)
MCV: 90.4 fL (ref 78.0–100.0)
Platelets: 200 10*3/uL (ref 150–400)
RBC: 5.09 MIL/uL (ref 4.22–5.81)
WBC: 6.9 10*3/uL (ref 4.0–10.5)

## 2013-08-10 MED ORDER — MORPHINE SULFATE 4 MG/ML IJ SOLN
4.0000 mg | Freq: Once | INTRAMUSCULAR | Status: DC
  Filled 2013-08-10: qty 1

## 2013-08-10 MED ORDER — NITROGLYCERIN 0.4 MG SL SUBL
0.4000 mg | SUBLINGUAL_TABLET | Freq: Once | SUBLINGUAL | Status: AC
  Administered 2013-08-10: 0.4 mg via SUBLINGUAL
  Filled 2013-08-10: qty 25

## 2013-08-10 MED ORDER — FUROSEMIDE 10 MG/ML IJ SOLN
60.0000 mg | Freq: Once | INTRAMUSCULAR | Status: AC
  Administered 2013-08-10: 60 mg via INTRAVENOUS
  Filled 2013-08-10: qty 6

## 2013-08-10 MED ORDER — NITROGLYCERIN 2 % TD OINT
1.0000 [in_us] | TOPICAL_OINTMENT | Freq: Four times a day (QID) | TRANSDERMAL | Status: DC
  Administered 2013-08-10: 1 [in_us] via TOPICAL
  Filled 2013-08-10: qty 1

## 2013-08-10 MED ORDER — ASPIRIN 325 MG PO TABS
325.0000 mg | ORAL_TABLET | Freq: Once | ORAL | Status: AC
  Administered 2013-08-10: 325 mg via ORAL
  Filled 2013-08-10: qty 1

## 2013-08-10 NOTE — ED Notes (Signed)
Pt transferred to [facility] via [facility]
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
## 2013-08-10 NOTE — ED Notes (Signed)
Family at bedside.Patient states pain is about gone.

## 2013-08-10 NOTE — H&P (Signed)
PCP:   Cassell Smiles., MD   Chief Complaint:  Chest pain  HPI:  72 year old male who  has a past medical history of Diabetes mellitus (2007); Hypertension; Gout; Hypercholesteremia; Peripheral vascular disease; Chronic pain; Sleep apnea; COPD (chronic obstructive pulmonary disease); S/P aortic valve replacement (1990); Chronic back pain; Dysphagia; Neuromuscular disorder; Peripheral neuropathy; and Critical lower limb ischemia. Presented to the ED with chest pain which started around 5 PM when patient was driving his car. Patient has extensive cardiac history status post CABG with St. Jude mechanical aVR, redo CABG in 1994, multiple PCI to the vein grafts severe PVD with PCI to the SFA bilaterally and right external like artery disease status post stage diamondback orbital rotational atherectomy, hypertension, diverticulitis, hyperlipidemia. Patient has been followed by [facility] Dr. Tresa Endo in the past. When patient came to the ED he was found to be hypertensive emergency with BP of 216/150, EKG shows ST lateral depression. Patient's pain was relieved after he received nitroglycerin and aspirin. At this time chest pain has resolved. Patient denies any shortness of breath, no nausea vomiting or diarrhea. Repeat EKG shows resolution of ST depression. First set of cardiac enzymes is negative.  Allergies:  No Known Allergies    Past Medical History  Diagnosis Date  . Diabetes mellitus 2007  . Hypertension   . Gout   . Hypercholesteremia   . Peripheral vascular disease     stents lower extremity  . Chronic pain     foot, hips  . Sleep apnea   . COPD (chronic obstructive pulmonary disease)   . 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 Chronic back pain   . Dysphagia   . Neuromuscular disorder   . Peripheral neuropathy   . Critical lower limb ischemia     Past Surgical History  Procedure Laterality Date  . Back surgery  9604,5409    2  . Open  heart surgery  1990    prosthetic heart valve, one bypass  . Rotator cuff repair      right  . Cataract extraction      bilateral  . Coronary stent placement  2005    RCA vein graft A 3.0x13.0 TAXUS stent was then placed int he vessel a Viva 3.0x4.0 (perfusion balloon was made ready it was placed through the entire lenght of the stent  . Peripheral vascular procedures lower extremities      right external iliac  artery PTA and stenting as well as bilateral SFA intervention remotely. Repeat procedures in 2011 bilaterally  . Coronary artery bypass graft  1994    6 vessels  . [name]  06/13/2011    Procedure: Elease Hashimoto DILATION;  Surgeon: Corbin Ade, MD;  Location: [facility];  Service: Endoscopy;  Laterality: N/A;  Dilated to 56.   . Angioplasty illiac artery      Prior to Admission medications   Medication Sig Start Date End Date Taking? Authorizing Provider  albuterol (PROVENTIL) 2 MG tablet Take 1 tablet by mouth 2 (two) times daily. 11/27/12  Yes Historical Provider, MD  albuterol-ipratropium (COMBIVENT) 18-103 MCG/ACT inhaler Inhale 1 puff into the lungs 4 (four) times daily. Coughing/ Shortness of Breath   Yes Historical Provider, MD  allopurinol (ZYLOPRIM) 300 MG tablet Take 300 mg by mouth at bedtime.    Yes Historical Provider, MD  amLODipine (NORVASC) 5 MG tablet Take 5 mg by mouth every morning.    Yes Historical Provider, MD  aspirin 81 MG tablet Take 81 mg by mouth at bedtime.    Yes Historical Provider, MD  fenofibrate (TRICOR) 145 MG tablet Take 1 tablet (145 mg total) by mouth daily. 07/19/13  Yes Runell Gess, MD  fish oil-omega-3 fatty acids 1000 MG capsule Take 1 capsule (1 g total) by mouth 2 (two) times daily. 01/22/13  Yes Phillips Hay, RPH-CPP  furosemide (LASIX) 80 MG tablet Take 40 mg by mouth daily.    Yes Historical Provider, MD  glimepiride (AMARYL) 2 MG tablet Take 2 mg by mouth 2 (two) times daily.    Yes Historical Provider, MD  Iodine 2-2.4 % SOLN  Apply 1 application topically daily. Applied to affected foot   Yes Historical Provider, MD  isosorbide mononitrate (IMDUR) 30 MG 24 hr tablet Take 1 tablet (30 mg total) by mouth daily. 02/17/13  Yes Lennette Bihari, MD  metoprolol succinate (TOPROL-XL) 50 MG 24 hr tablet Take 125 mg by mouth every morning. 2 and one-half tablet (125mg  total) taken once daily. Take with or immediately following a meal.   Yes Historical Provider, MD  oxyCODONE-acetaminophen (PERCOCET) 10-325 MG per tablet Take 1 tablet by mouth every 6 (six) hours as needed for pain.  01/19/13  Yes Historical Provider, MD  potassium chloride SA (K-DUR,KLOR-CON) 20 MEQ tablet Take 20 mEq by mouth daily.     Yes Historical Provider, MD  pregabalin (LYRICA) 50 MG capsule Take 50 mg by mouth at bedtime.    Yes Historical Provider, MD  rosuvastatin (CRESTOR) 20 MG tablet Take 1 tablet (20 mg total) by mouth at bedtime. 04/23/13  Yes Runell Gess, MD  SANTYL ointment Apply 1 application topically daily.  07/21/13  Yes Historical Provider, MD  sitaGLIPtin (JANUVIA) 100 MG tablet Take 100 mg by mouth every morning.    Yes Historical Provider, MD  SSD 1 % cream Apply 1 application topically daily.  08/10/13  Yes Historical Provider, MD  warfarin (COUMADIN) 5 MG tablet Take 5-7.5 mg by mouth See admin instructions. Takes one tablet (5mg  total) every day but takes 7.5mg  total on Mondays and Fridays   Yes Historical Provider, MD    Social History:  reports that he has been smoking Cigarettes.  He started smoking about 60 years ago. He has a 55 pack-year smoking history. He quit smokeless tobacco use about 17 years ago. His smokeless tobacco use included Chew. He reports that he drinks alcohol. He reports that he does not use illicit drugs.  Family History  Problem Relation Age of Onset  . Colon cancer Neg Hx   . Liver disease Neg Hx      All the positives are listed in BOLD  Review of Systems:  HEENT: Headache, blurred vision, runny nose,  sore throat Neck: Hypothyroidism, hyperthyroidism,lymphadenopathy Chest : Shortness of breath, history of COPD, Asthma Heart : Chest pain, history of coronary arterey disease GI:  Nausea, vomiting, diarrhea, constipation, GERD GU: Dysuria, urgency, frequency of urination, hematuria Neuro: Stroke, seizures, syncope Psych: Depression, anxiety, hallucinations   Physical Exam: Blood pressure 180/81, pulse 91, temperature 98.3 F (36.8 C), resp. rate 20, height 5\' 10"  (1.778 m), weight 100.699 kg (222 lb), SpO2 96.00%. Constitutional:   Patient is a well-developed and well-nourished *male in no acute distress and cooperative with exam. Head: Normocephalic and atraumatic Mouth: Mucus membranes moist Eyes: PERRL, EOMI, conjunctivae normal Neck: Supple, No Thyromegaly Cardiovascular: RRR, S1 normal, S2 normal Pulmonary/Chest: Decreased breath sounds bilaterally Abdominal: Soft. Non-tender, non-distended, bowel sounds  are normal, no masses, organomegaly, or guarding present.  Neurological: A&O x3, Strenght is normal and symmetric bilaterally, cranial nerve II-XII are grossly intact, no focal motor deficit, sensory intact to light touch bilaterally.  Extremities : Left big toe has a small healing ulcer. No discharge noted no erythema nontender to palpation.   Labs on Admission:  Results for orders placed during the hospital encounter of 08/10/13 (from the past 48 hour(s))  CBC     Status: None   Collection Time    08/10/13  6:41 PM      Result Value Range   WBC 6.9  4.0 - 10.5 K/uL   RBC 5.09  4.22 - 5.81 MIL/uL   Hemoglobin 15.4  13.0 - 17.0 g/dL   HCT 21.3  08.6 - 57.8 %   MCV 90.4  78.0 - 100.0 fL   MCH 30.3  26.0 - 34.0 pg   MCHC 33.5  30.0 - 36.0 g/dL   RDW 46.9  62.9 - 52.8 %   Platelets 200  150 - 400 K/uL  COMPREHENSIVE METABOLIC PANEL     Status: Abnormal   Collection Time    08/10/13  6:41 PM      Result Value Range   Sodium 141  135 - 145 mEq/L   Potassium 4.0  3.5 - 5.1  mEq/L   Chloride 102  96 - 112 mEq/L   CO2 25  19 - 32 mEq/L   Glucose, Bld 150 (*) 70 - 99 mg/dL   BUN 19  6 - 23 mg/dL   Creatinine, Ser 4.13  0.50 - 1.35 mg/dL   Calcium 24.4  8.4 - 01.0 mg/dL   Total Protein 7.7  6.0 - 8.3 g/dL   Albumin 4.3  3.5 - 5.2 g/dL   AST 22  0 - 37 U/L   ALT 16  0 - 53 U/L   Alkaline Phosphatase 37 (*) 39 - 117 U/L   Total Bilirubin 0.5  0.3 - 1.2 mg/dL   GFR calc non Af Amer 83 (*) >90 mL/min   GFR calc Af Amer >90  >90 mL/min   Comment: (NOTE)     The eGFR has been calculated using the CKD EPI equation.     This calculation has not been validated in all clinical situations.     eGFR's persistently <90 mL/min signify possible Chronic Kidney     Disease.  TROPONIN I     Status: None   Collection Time    08/10/13  6:41 PM      Result Value Range   Troponin I <0.30  <0.30 ng/mL   Comment:            Due to the release kinetics of cTnI,     a negative result within the first hours     of the onset of symptoms does not rule out     myocardial infarction with certainty.     If myocardial infarction is still suspected,     repeat the test at appropriate intervals.  PROTIME-INR     Status: Abnormal   Collection Time    08/10/13  6:41 PM      Result Value Range   Prothrombin Time 30.7 (*) 11.6 - 15.2 seconds   INR 3.08 (*) 0.00 - 1.49  PRO B NATRIURETIC PEPTIDE     Status: Abnormal   Collection Time    08/10/13  6:41 PM      Result Value Range   Pro  B Natriuretic peptide (BNP) 784.4 (*) 0 - 125 pg/mL    Radiological Exams on Admission: Dg Chest Portable 1 View  08/10/2013   CLINICAL DATA:  Chest pain, shortness of Breath.  EXAM: PORTABLE CHEST - 1 VIEW  COMPARISON:  02/11/2013  FINDINGS: Previous median sternotomy. Mild cardiomegaly stable. Perihilar and bibasilar interstitial edema or infiltrates, new since previous exam. Blunting of left lateral costophrenic angle as before. . No definite effusion.  IMPRESSION: 1. Stable cardiomegaly with new  bilateral interstitial edema or infiltrates.   Electronically Signed   By: Oley Balm M.D.   On: 08/10/2013 19:13    Assessment/Plan Principal Problem:   Chest pain Active Problems:   H/O aortic valve replacement   CAD (coronary artery disease) of artery bypass graft   Tobacco abuse   PVD (peripheral vascular disease)   DM2 (diabetes mellitus, type 2)   HTN (hypertension)  foot ulcer Unstable angina  72 year old male with multiple medical problems will be admitted with chest pain rule out acute coronary syndrome. Patient will be transferred to [facility] and will be seen by Dr. Tresa Endo in the morning. ED physician had spoken to the cardiology on call Dr. Verdie Mosher, who will see the patient at [facility]. In the meantime patient has been given one dose of Lasix 60 mg for flash pulmonary edema due to hypertensive emergency. We'll continue Lasix 40 mg IV every 12 hours. We'll start nitro paste 1 inch every 6 hours. Will hold the long acting isosorbide mononitrate at this time. Patient is already on Coumadin for mechanical aortic valve. Pharmacy to manage Coumadin dosing. We'll also start sliding scale insulin for diabetes mellitus. Patient will be transferred to [facility], discussed with Dr. Toniann Fail who is the accepting physician at [facility]. Patient has severe peripheral arterial disease, and has a healed ulcer on the left toe. We'll continue with Sentyl and silvadine ointment.  Code status: Patient is full code  Family discussion: Discussed with patient's daughter and wife at bedside   Time Spent on Admission: 70 min  [facility] Pager: [phone] 08/10/2013, 9:38 PM  If 7PM-7AM, please contact night-coverage  [email or web address]  Password [email or web address]

## 2013-08-10 NOTE — Consult Note (Signed)
CARDIOLOGY CONSULT NOTE   Tanner Pena MRN: [id number] DOB/AGE: 1941/08/16 72 y.o. Admit date: 08/10/2013  Referring Physician:  Dr. Toniann Fail Primary Cardiologist: Dr. Tresa Endo  Reason for consultation:  Chest pain  HPI:  Tanner Pena is a pleasant 72 yo male with extensive cardiac history, including a history of CAD, status post bypass grafting in 1990 with a St. Jude mechanical AVR, redo CABG in 1994, and multiple PCI to his vein grafts, severe PVD with PCIs to SFAs bilaterally, and right external iliac artery disease s/p staged Diamondback orbital rotational atherectomy, continued tobacco abuse, hypertension, hyperlipidemia, type 2 diabetes, and obstructive sleep apnea who presented to [facility] earlier today for a sudden onset chest pain while he was feeding chicken. The pain was relieved with ASA and Nitro and he was found to be in hypertensive emergency with BP 216/150 and possible flash pulmonary edema. Patient was later transferred to [facility] after chest pain resolved. His ECG showed some dynamic changes in lateral leads and his first set of cardiac enzymes were negative. Patient is admitted hospitalist service and is seen in consultation by cardiology for unstable angina.   On my interview, patient states his pain completely resolved. He endorse some mild SOB. Patient denies orthopnea, edema or paroxysmal nocturnal dyspnea.  He recently ran out of some of his medications including Lasix for about 1 week.   Review of systems: A review of 10 organ systems was done and is negative except as stated above in HPI  Past Medical History  Diagnosis Date  . Diabetes mellitus 2007  . Hypertension   . Gout   . Hypercholesteremia   . Peripheral vascular disease     stents lower extremity  . Chronic pain     foot, hips  . Sleep apnea   . COPD (chronic obstructive pulmonary disease)   . 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 Chronic back pain   . Dysphagia   . Neuromuscular  disorder   . Peripheral neuropathy   . Critical lower limb ischemia    Past Surgical History  Procedure Laterality Date  . Back surgery  [id number]    2  . Open heart surgery  1990    prosthetic heart valve, one bypass  . Rotator cuff repair      right  . Cataract extraction      bilateral  . Coronary stent placement  2005    RCA vein graft A 3.0x13.0 TAXUS stent was then placed int he vessel a Viva 3.0x4.0 (perfusion balloon was made ready it was placed through the entire lenght of the stent  . Peripheral vascular procedures lower extremities      right external iliac  artery PTA and stenting as well as bilateral SFA intervention remotely. Repeat procedures in 2011 bilaterally  . Coronary artery bypass graft  1994    6 vessels  . [name]  06/13/2011    Procedure: Elease Hashimoto DILATION;  Surgeon: Corbin Ade, MD;  Location: [facility];  Service: Endoscopy;  Laterality: N/A;  Dilated to 56.   . Angioplasty illiac artery     History   Social History  . Marital Status: Legally Separated    Spouse Name: N/A    Number of Children: 5  . Years of Education: N/A   Occupational History  . retired     Air traffic controller   Social History Main Topics  . Smoking status: Current Every Day Smoker -- 1.00 packs/day for 55  years    Types: Cigarettes    Start date: 08/19/1953  . Smokeless tobacco: Former Neurosurgeon    Types: Chew    Quit date: 08/20/1995     Comment: Has quit on 3 occasions. Counseling given today 5-10 minutes   I am more than likely going to quit "  . Alcohol Use: Yes     Comment: socially, sometimes 12 ounce beer daily, may go month without/no whiskey  . Drug Use: No  . Sexual Activity: Not on file   Other Topics Concern  . Not on file   Social History Narrative   Has 3 daughters   Has 2 sons    Family History  Problem Relation Age of Onset  . Colon cancer Neg Hx   . Liver disease Neg Hx      No Known Allergies  Current Facility-Administered Medications    Medication Dose Route Frequency Provider Last Rate Last Dose  . morphine 4 MG/ML injection 4 mg  4 mg Intravenous Once Lyanne Co, MD       Current Outpatient Prescriptions  Medication Sig Dispense Refill  . albuterol (PROVENTIL) 2 MG tablet Take 1 tablet by mouth 2 (two) times daily.      [name] albuterol-ipratropium (COMBIVENT) 18-103 MCG/ACT inhaler Inhale 1 puff into the lungs 4 (four) times daily. Coughing/ Shortness of Breath      . allopurinol (ZYLOPRIM) 300 MG tablet Take 300 mg by mouth at bedtime.       [name] amLODipine (NORVASC) 5 MG tablet Take 5 mg by mouth every morning.       [name] aspirin 81 MG tablet Take 81 mg by mouth at bedtime.       . fenofibrate (TRICOR) 145 MG tablet Take 1 tablet (145 mg total) by mouth daily.  30 tablet  6  . fish oil-omega-3 fatty acids 1000 MG capsule Take 1 capsule (1 g total) by mouth 2 (two) times daily.      . furosemide (LASIX) 80 MG tablet Take 40 mg by mouth daily.       [name] glimepiride (AMARYL) 2 MG tablet Take 2 mg by mouth 2 (two) times daily.       . Iodine 2-2.4 % SOLN Apply 1 application topically daily. Applied to affected foot      . isosorbide mononitrate (IMDUR) 30 MG 24 hr tablet Take 1 tablet (30 mg total) by mouth daily.  30 tablet  5  . metoprolol succinate (TOPROL-XL) 50 MG 24 hr tablet Take 125 mg by mouth every morning. 2 and one-half tablet (125mg  total) taken once daily. Take with or immediately following a meal.      . oxyCODONE-acetaminophen (PERCOCET) 10-325 MG per tablet Take 1 tablet by mouth every 6 (six) hours as needed for pain.       . potassium chloride SA (K-DUR,KLOR-CON) 20 MEQ tablet Take 20 mEq by mouth daily.        . pregabalin (LYRICA) 50 MG capsule Take 50 mg by mouth at bedtime.       . rosuvastatin (CRESTOR) 20 MG tablet Take 1 tablet (20 mg total) by mouth at bedtime.  30 tablet  11  . SANTYL ointment Apply 1 application topically daily.       . sitaGLIPtin (JANUVIA) 100 MG tablet Take 100 mg by mouth every  morning.       [name] SSD 1 % cream Apply 1 application topically daily.       [name] warfarin (COUMADIN)  5 MG tablet Take 5-7.5 mg by mouth See admin instructions. Takes one tablet (5mg  total) every day but takes 7.5mg  total on Mondays and Fridays        Physical Exam: Blood pressure 180/81, pulse 91, temperature 98.3 F (36.8 C), resp. rate 20, height 5\' 10"  (1.778 m), weight 222 lb (100.699 kg), SpO2 96.00%.; Body mass index is 31.85 kg/(m^2). Temp:  [98.3 F (36.8 C)] 98.3 F (36.8 C) (12/23 1828) Pulse Rate:  [91-108] 91 (12/23 2029) Resp:  [20-24] 20 (12/23 2029) BP: (154-206)/(73-117) 180/81 mmHg (12/23 2029) SpO2:  [93 %-96 %] 96 % (12/23 2029) Weight:  [222 lb (100.699 kg)] 222 lb (100.699 kg) (12/23 1824)  No intake or output data in the 24 hours ending 08/10/13 2055 General: NAD Heent: MMM Neck: Mild JVD  CV: Nondisplaced PMI.  RRR, nl S1/S2, no S3/S4, no murmur. No carotid bruit   Lungs: Mild crackles bilat. normal respiratory effort Abdomen: Soft, nontender, nondistended Extremities: No clubbing or cyanosis.  Normal pedal pulses. No pedal edema Skin: Intact without lesions or rashes  Neurologic: Alert and oriented x 3, grossly nonfocal  Psych: Normal mood and affect    Labs:  Recent Labs  08/10/13 1841  TROPONINI <0.30   Lab Results  Component Value Date   WBC 6.9 08/10/2013   HGB 15.4 08/10/2013   HCT 46.0 08/10/2013   MCV 90.4 08/10/2013   PLT 200 08/10/2013    Recent Labs Lab 08/10/13 1841  NA 141  K 4.0  CL 102  CO2 25  BUN 19  CREATININE 0.90  CALCIUM 10.3  PROT 7.7  BILITOT 0.5  ALKPHOS 37*  ALT 16  AST 22  GLUCOSE 150*   Lab Results  Component Value Date   LDLCALC 50 01/07/2013   TRIG 219* 01/07/2013       EKG:  Sinus tachycardia with IVCD and PVCs, lateral ST depression and TWI, later improved.  Echo 12/2012: EF 25-30%, LV thrombus, St Jude Mechanical valve trivial AI, bi atrial enlargement. nrl RV function  Radiology:  Dg Chest  Portable 1 View  08/10/2013   CLINICAL DATA:  Chest pain, shortness of Breath.  EXAM: PORTABLE CHEST - 1 VIEW  COMPARISON:  02/11/2013  FINDINGS: Previous median sternotomy. Mild cardiomegaly stable. Perihilar and bibasilar interstitial edema or infiltrates, new since previous exam. Blunting of left lateral costophrenic angle as before. . No definite effusion.  IMPRESSION: 1. Stable cardiomegaly with new bilateral interstitial edema or infiltrates.   Electronically Signed   By: Oley Balm M.D.   On: 08/10/2013 19:13    ASSESSMENT:  72 yo male with extensive cardiac history, including a history of CAD, status post bypass grafting in 1990 with a St. Jude mechanical AVR, redo CABG in 1994, and multiple PCI to his vein grafts, severe PVD with PCIs to SFAs bilaterally, and right external iliac artery disease s/p staged Diamondback orbital rotational atherectomy, continued tobacco abuse, hypertension, hyperlipidemia, type 2 diabetes, and obstructive sleep apnea who is seen in consultation for chest pain  1. Unstable anginal with dynamic ECG changes in lateral leads, in the setting of hypertensive urgency 2. Hypertensive urgency 3. Flash pulmonary edema 2/2 #2 4. Mechanic aortic valve 5. Medical noncompliance   PLAN:  1. Continue cycle troponin 2. Nitro patch for both angina and pulmonary edema 3. Continue ASA, Metoprolol and statin 4. INR =3, will start Heparin gtt if INR <2.5; especially if persistent chest pain or marked Troponin elevation 5. BP goal next 24  hrs <160 systolic, restart home meds and prn hydralazine. If creatinine allows, will consider to start an ACEI for BP and LV function.  6. Diuresis as BP and kidney function tolerates. 7. Counseling done for importance of getting med refill in time.   Thank you for this consultation.  Will continue to follow.  Signed: Haydee Salter, MD Cardiology Fellow 08/10/2013, 8:55 PM

## 2013-08-10 NOTE — ED Provider Notes (Signed)
CSN: [id number]     Arrival date & time 08/10/13  1817 History  This chart was scribed for Lyanne Co, MD by Dorothey Baseman, ED Scribe. This patient was seen in room APA01/APA01 and the patient's care was started at 6:39 PM.    Chief Complaint  Patient presents with  . Chest Pain   The history is provided by the patient and a relative (daughter). No language interpreter was used.   HPI Comments: Tanner Pena is a 72 y.o. male with a history of HTN, hyperlipidemia, COPD, DM, and peripheral neuropathy who presents to the Emergency Department complaining of sudden onset left-sided chest pain while driving about an hour ago. He reports that the pain radiates into the neck and the bilateral arms. He reports that the pain has been gradually improving since arrival to the ED. He denies any exacerbating or alleviating factors. He states that these types of symptoms are new for him. His daughter reports that the patient was complaining of similar symptoms yesterday that resolved on its own. He denies taking any medications at home to treat his symptoms. Patient currently takes coumadin. Patient has a history of CABG in 1994. Patient reports that he does not remember when his last catheterization was.   Past Medical History  Diagnosis Date  . Diabetes mellitus 2007  . Hypertension   . Gout   . Hypercholesteremia   . Peripheral vascular disease     stents lower extremity  . Chronic pain     foot, hips  . Sleep apnea   . COPD (chronic obstructive pulmonary disease)   . 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 Chronic back pain   . Dysphagia   . Neuromuscular disorder   . Peripheral neuropathy   . Critical lower limb ischemia    Past Surgical History  Procedure Laterality Date  . Back surgery  [id number],[id number]    2  . Open heart surgery  1990    prosthetic heart valve, one bypass  . Rotator cuff repair      right  . Cataract extraction      bilateral  . Coronary stent placement   2005    RCA vein graft A 3.0x13.0 TAXUS stent was then placed int he vessel a Viva 3.0x4.0 (perfusion balloon was made ready it was placed through the entire lenght of the stent  . Peripheral vascular procedures lower extremities      right external iliac  artery PTA and stenting as well as bilateral SFA intervention remotely. Repeat procedures in 2011 bilaterally  . Coronary artery bypass graft  1994    6 vessels  . [name]  06/13/2011    Procedure: Elease Hashimoto DILATION;  Surgeon: Corbin Ade, MD;  Location: [facility];  Service: Endoscopy;  Laterality: N/A;  Dilated to 56.   . Angioplasty illiac artery     Family History  Problem Relation Age of Onset  . Colon cancer Neg Hx   . Liver disease Neg Hx    History  Substance Use Topics  . Smoking status: Current Every Day Smoker -- 1.00 packs/day for 55 years    Types: Cigarettes    Start date: 08/19/1953  . Smokeless tobacco: Former Neurosurgeon    Types: Chew    Quit date: 08/20/1995     Comment: Has quit on 3 occasions. Counseling given today 5-10 minutes   I am more than likely going to quit "  . Alcohol Use: Yes  Comment: socially, sometimes 12 ounce beer daily, may go month without/no whiskey    Review of Systems  A complete 10 system review of systems was obtained and all systems are negative except as noted in the HPI and PMH.   Allergies  Review of patient's allergies indicates no known allergies.  Home Medications   Current Outpatient Rx  Name  Route  Sig  Dispense  Refill  . albuterol (PROVENTIL) 2 MG tablet   Oral   Take 1 tablet by mouth 2 (two) times daily.         [name] albuterol-ipratropium (COMBIVENT) 18-103 MCG/ACT inhaler   Inhalation   Inhale 1 puff into the lungs 4 (four) times daily. Coughing/ Shortness of Breath         . allopurinol (ZYLOPRIM) 300 MG tablet   Oral   Take 300 mg by mouth at bedtime.          [name] amLODipine (NORVASC) 5 MG tablet   Oral   Take 5 mg by mouth every morning.           [name] aspirin 81 MG tablet   Oral   Take 81 mg by mouth at bedtime.          . fenofibrate (TRICOR) 145 MG tablet   Oral   Take 1 tablet (145 mg total) by mouth daily.   30 tablet   6   . fish oil-omega-3 fatty acids 1000 MG capsule   Oral   Take 1 capsule (1 g total) by mouth 2 (two) times daily.         . furosemide (LASIX) 80 MG tablet   Oral   Take 40 mg by mouth daily.          [name] glimepiride (AMARYL) 2 MG tablet   Oral   Take 2 mg by mouth 2 (two) times daily.          . Iodine 2-2.4 % SOLN   Apply externally   Apply 1 application topically daily. Applied to affected foot         . isosorbide mononitrate (IMDUR) 30 MG 24 hr tablet   Oral   Take 1 tablet (30 mg total) by mouth daily.   30 tablet   5   . metoprolol succinate (TOPROL-XL) 50 MG 24 hr tablet   Oral   Take 125 mg by mouth every morning. 2 and one-half tablet (125mg  total) taken once daily. Take with or immediately following a meal.         . oxyCODONE-acetaminophen (PERCOCET) 10-325 MG per tablet   Oral   Take 1 tablet by mouth every 6 (six) hours as needed for pain.          . potassium chloride SA (K-DUR,KLOR-CON) 20 MEQ tablet   Oral   Take 20 mEq by mouth daily.           . pregabalin (LYRICA) 50 MG capsule   Oral   Take 50 mg by mouth at bedtime.          . rosuvastatin (CRESTOR) 20 MG tablet   Oral   Take 1 tablet (20 mg total) by mouth at bedtime.   30 tablet   11   . SANTYL ointment   Topical   Apply 1 application topically daily.          . sitaGLIPtin (JANUVIA) 100 MG tablet   Oral   Take 100 mg by mouth every morning.          [name]  SSD 1 % cream   Topical   Apply 1 application topically daily.          [name] warfarin (COUMADIN) 5 MG tablet   Oral   Take 5-7.5 mg by mouth See admin instructions. Takes one tablet (5mg  total) every day but takes 7.5mg  total on Mondays and Fridays          Triage Vitals: BP 206/117  Pulse 108  Temp(Src) 98.3 F (36.8  C)  Resp 22  Ht 5\' 10"  (1.778 m)  Wt 222 lb (100.699 kg)  BMI 31.85 kg/m2  SpO2 93%  Physical Exam  Nursing note and vitals reviewed. Constitutional: He is oriented to person, place, and time. He appears well-developed and well-nourished.  HENT:  Head: Normocephalic and atraumatic.  Eyes: EOM are normal.  Neck: Normal range of motion.  Cardiovascular: Normal rate, regular rhythm, normal heart sounds and intact distal pulses.   Pulmonary/Chest: Effort normal and breath sounds normal. No respiratory distress.  Abdominal: Soft. He exhibits no distension. There is no tenderness.  Musculoskeletal: Normal range of motion.  Neurological: He is alert and oriented to person, place, and time.  Skin: Skin is warm and dry.  Psychiatric: He has a normal mood and affect. Judgment normal.    ED Course  Procedures (including critical care time)  DIAGNOSTIC STUDIES: Oxygen Saturation is 93% on room air, adequate by my interpretation.    COORDINATION OF CARE: 6:43 PM- Ordered a chest x-ray and blood labs. Ordered nitroglycerin, aspirin, and morphine to manage symptoms. Discussed treatment plan with patient at bedside and patient verbalized agreement.   7:02 PM- Patient reports feeling somewhat better even before receiving the medication.   7:51 PM- Patient reports that his pain has completely subsided to a 0/10 after receiving the medication. Will consult with the patient's cardiologist. Discussed that the patient will likely need to be admitted. Discussed treatment plan with patient at bedside and patient verbalized agreement.   8:41 PM- Consulted with Dr. Verdie Mosher (cardiologist). Agree that heparinization will not be necessary at this time. Dr. Verdie Mosher will see the patient as a consult. Discussed treatment plan with patient at bedside and patient verbalized agreement.    CRITICAL CARE Performed by: Lyanne Co Total critical care time: 30 Critical care time was exclusive of separately billable  procedures and treating other patients. Critical care was necessary to treat or prevent imminent or life-threatening deterioration. Critical care was time spent personally by me on the following activities: development of treatment plan with patient and/or surrogate as well as nursing, discussions with consultants, evaluation of patient's response to treatment, examination of patient, obtaining history from patient or surrogate, ordering and performing treatments and interventions, ordering and review of laboratory studies, ordering and review of radiographic studies, pulse oximetry and re-evaluation of patient's condition.  Labs Review Labs Reviewed  COMPREHENSIVE METABOLIC PANEL - Abnormal; Notable for the following:    Glucose, Bld 150 (*)    Alkaline Phosphatase 37 (*)    GFR calc non Af Amer 83 (*)    All other components within normal limits  PROTIME-INR - Abnormal; Notable for the following:    Prothrombin Time 30.7 (*)    INR 3.08 (*)    All other components within normal limits  PRO B NATRIURETIC PEPTIDE - Abnormal; Notable for the following:    Pro B Natriuretic peptide (BNP) 784.4 (*)    All other components within normal limits  CBC  TROPONIN I   Imaging Review Dg Chest Portable  1 View  08/10/2013   CLINICAL DATA:  Chest pain, shortness of Breath.  EXAM: PORTABLE CHEST - 1 VIEW  COMPARISON:  02/11/2013  FINDINGS: Previous median sternotomy. Mild cardiomegaly stable. Perihilar and bibasilar interstitial edema or infiltrates, new since previous exam. Blunting of left lateral costophrenic angle as before. . No definite effusion.  IMPRESSION: 1. Stable cardiomegaly with new bilateral interstitial edema or infiltrates.   Electronically Signed   By: Oley Balm M.D.   On: 08/10/2013 19:13  I personally reviewed the imaging tests through PACS system I reviewed available ER/hospitalization records through the EMR   EKG Interpretation    Date/Time:  Tuesday August 10 2013  18:21:10 EST Ventricular Rate:  109 PR Interval:  230 QRS Duration: 136 QT Interval:  364 QTC Calculation: 490 R Axis:   51 Text Interpretation:  Sinus tachycardia with 1st degree A-V block with frequent Premature ventricular complexes Possible Left atrial enlargement Non-specific intra-ventricular conduction block ST depression laterally which is new since prior ecg Trigeminy Abnormal ECG When compared with ECG of 20-Jul-2010 03:25, Significant changes have occurred Confirmed by Brithney Bensen  MD, Irva Loser ([id number]) on 08/10/2013 6:49:48 PM              ECG interpretation   Date: 08/10/2013  Rate: 104  Rhythm: Sinus tachycardia with first degree AV block  QRS Axis: normal  Intervals: normal  ST/T Wave abnormalities: lateral ST depression improved since prior ecg  Conduction Disutrbances: Incomplete left bundle branch block  Narrative Interpretation:   Old EKG Reviewed: improvement in lateral ischemic changes      MDM   1. Unstable angina   2. Hypertensive urgency    Patient's blood pressure on arrival was hypertensive.  He does have possible new pulmonary edema bilaterally.  This may represent hypertensive emergency with flash pulmonary edema.  Patient with ischemic changes on his EKG with lateral ST depression.  This seemed to be resolving with improvement in his discomfort and pain.  After aspirin, morphine, 1 sublingual nitroglycerin the patient is now chest pain-free.  Lateral ST ischemic changes are significantly improved.  No evidence of ST elevation.  Pain-free.  Patient is anticoagulated on Coumadin this time with INR 3.0.  We'll hold heparin at this time given.  Patient need to be admitted the hospital.  I thinkshe'll benefit from transfer to the [facility].  I personally performed the services described in this documentation, which was scribed in my presence. The recorded information has been reviewed and is accurate.       Lyanne Co, MD 08/14/13  [phone]

## 2013-08-11 ENCOUNTER — Encounter (HOSPITAL_COMMUNITY): Payer: Self-pay | Admitting: General Practice

## 2013-08-11 DIAGNOSIS — Z954 Presence of other heart-valve replacement: Secondary | ICD-10-CM

## 2013-08-11 DIAGNOSIS — I2510 Atherosclerotic heart disease of native coronary artery without angina pectoris: Secondary | ICD-10-CM

## 2013-08-11 DIAGNOSIS — I447 Left bundle-branch block, unspecified: Secondary | ICD-10-CM | POA: Diagnosis present

## 2013-08-11 DIAGNOSIS — I5043 Acute on chronic combined systolic (congestive) and diastolic (congestive) heart failure: Secondary | ICD-10-CM | POA: Diagnosis present

## 2013-08-11 DIAGNOSIS — I10 Essential (primary) hypertension: Secondary | ICD-10-CM

## 2013-08-11 DIAGNOSIS — I999 Unspecified disorder of circulatory system: Secondary | ICD-10-CM

## 2013-08-11 DIAGNOSIS — E785 Hyperlipidemia, unspecified: Secondary | ICD-10-CM

## 2013-08-11 DIAGNOSIS — I2589 Other forms of chronic ischemic heart disease: Secondary | ICD-10-CM

## 2013-08-11 DIAGNOSIS — J449 Chronic obstructive pulmonary disease, unspecified: Secondary | ICD-10-CM | POA: Diagnosis present

## 2013-08-11 DIAGNOSIS — Z7901 Long term (current) use of anticoagulants: Secondary | ICD-10-CM

## 2013-08-11 DIAGNOSIS — I509 Heart failure, unspecified: Secondary | ICD-10-CM

## 2013-08-11 LAB — TROPONIN I
Troponin I: <0.3 ng/mL (ref <0.30)
Troponin I: <0.3 ng/mL (ref <0.30)

## 2013-08-11 LAB — COMPREHENSIVE METABOLIC PANEL
ALT: 13 U/L (ref 0–53)
AST: 17 U/L (ref 0–37)
CO2: 25 mEq/L (ref 19–32)
Chloride: 105 mEq/L (ref 96–112)
Creatinine, Ser: 0.96 mg/dL (ref 0.50–1.35)
GFR calc Af Amer: >90 mL/min (ref >90)
GFR calc non Af Amer: 81 mL/min — ABNORMAL LOW (ref >90)
Glucose, Bld: 154 mg/dL — ABNORMAL HIGH (ref 70–99)
Sodium: 141 mEq/L (ref 135–145)
Total Bilirubin: 0.4 mg/dL (ref 0.3–1.2)

## 2013-08-11 LAB — CBC
Hemoglobin: 14.4 g/dL (ref 13.0–17.0)
MCV: 90.2 fL (ref 78.0–100.0)
Platelets: 197 10*3/uL (ref 150–400)
RBC: 4.71 MIL/uL (ref 4.22–5.81)
RDW: 14.8 % (ref 11.5–15.5)
WBC: 7.9 10*3/uL (ref 4.0–10.5)

## 2013-08-11 LAB — GLUCOSE, CAPILLARY: Glucose-Capillary: 146 mg/dL — ABNORMAL HIGH (ref 70–99)

## 2013-08-11 MED ORDER — SILVER SULFADIAZINE 1 % EX CREA
1.0000 "application " | TOPICAL_CREAM | Freq: Every day | CUTANEOUS | Status: DC
  Administered 2013-08-11 – 2013-08-15 (×5): 1 via TOPICAL
  Filled 2013-08-11: qty 85

## 2013-08-11 MED ORDER — WARFARIN SODIUM 5 MG PO TABS
5.0000 mg | ORAL_TABLET | ORAL | Status: DC
  Filled 2013-08-11: qty 1

## 2013-08-11 MED ORDER — FENOFIBRATE 160 MG PO TABS
160.0000 mg | ORAL_TABLET | Freq: Every day | ORAL | Status: DC
  Administered 2013-08-11 – 2013-08-16 (×6): 160 mg via ORAL
  Filled 2013-08-11 (×6): qty 1

## 2013-08-11 MED ORDER — SODIUM CHLORIDE 0.9 % IV SOLN
250.0000 mL | INTRAVENOUS | Status: DC | PRN
  Administered 2013-08-14: 250 mL via INTRAVENOUS

## 2013-08-11 MED ORDER — PREGABALIN 50 MG PO CAPS
50.0000 mg | ORAL_CAPSULE | Freq: Every day | ORAL | Status: DC
  Administered 2013-08-11 – 2013-08-15 (×6): 50 mg via ORAL
  Filled 2013-08-11 (×6): qty 1

## 2013-08-11 MED ORDER — METOPROLOL SUCCINATE ER 25 MG PO TB24
125.0000 mg | ORAL_TABLET | Freq: Every morning | ORAL | Status: DC
  Administered 2013-08-11 – 2013-08-12 (×2): 125 mg via ORAL
  Filled 2013-08-11 (×3): qty 1

## 2013-08-11 MED ORDER — POTASSIUM CHLORIDE CRYS ER 20 MEQ PO TBCR
20.0000 meq | EXTENDED_RELEASE_TABLET | Freq: Every day | ORAL | Status: DC
  Administered 2013-08-11 – 2013-08-16 (×6): 20 meq via ORAL
  Filled 2013-08-11 (×6): qty 1

## 2013-08-11 MED ORDER — HYDRALAZINE HCL 25 MG PO TABS
25.0000 mg | ORAL_TABLET | Freq: Four times a day (QID) | ORAL | Status: DC | PRN
  Filled 2013-08-11: qty 1

## 2013-08-11 MED ORDER — INSULIN ASPART 100 UNIT/ML ~~LOC~~ SOLN
0.0000 [IU] | Freq: Three times a day (TID) | SUBCUTANEOUS | Status: DC
  Administered 2013-08-11 (×3): 1 [IU] via SUBCUTANEOUS
  Administered 2013-08-12: 2 [IU] via SUBCUTANEOUS
  Administered 2013-08-12: 3 [IU] via SUBCUTANEOUS
  Administered 2013-08-13: 2 [IU] via SUBCUTANEOUS
  Administered 2013-08-13: 3 [IU] via SUBCUTANEOUS
  Administered 2013-08-14 (×2): 2 [IU] via SUBCUTANEOUS
  Administered 2013-08-14: 1 [IU] via SUBCUTANEOUS
  Administered 2013-08-15 (×2): 2 [IU] via SUBCUTANEOUS
  Administered 2013-08-15: 5 [IU] via SUBCUTANEOUS
  Administered 2013-08-16: 2 [IU] via SUBCUTANEOUS

## 2013-08-11 MED ORDER — WARFARIN SODIUM 7.5 MG PO TABS: 7.5000 mg | ORAL_TABLET | ORAL | Status: DC

## 2013-08-11 MED ORDER — MORPHINE SULFATE 2 MG/ML IJ SOLN: 2.0000 mg | INTRAMUSCULAR | Status: DC | PRN

## 2013-08-11 MED ORDER — NITROGLYCERIN 2 % TD OINT
1.0000 [in_us] | TOPICAL_OINTMENT | Freq: Four times a day (QID) | TRANSDERMAL | Status: DC
  Administered 2013-08-11 – 2013-08-13 (×8): 1 [in_us] via TOPICAL
  Filled 2013-08-11: qty 30

## 2013-08-11 MED ORDER — FUROSEMIDE 10 MG/ML IJ SOLN
40.0000 mg | Freq: Two times a day (BID) | INTRAMUSCULAR | Status: DC
  Administered 2013-08-11 – 2013-08-16 (×11): 40 mg via INTRAVENOUS
  Filled 2013-08-11 (×14): qty 4

## 2013-08-11 MED ORDER — ASPIRIN 81 MG PO CHEW
81.0000 mg | CHEWABLE_TABLET | Freq: Every day | ORAL | Status: DC
  Administered 2013-08-11 – 2013-08-15 (×5): 81 mg via ORAL
  Filled 2013-08-11 (×7): qty 1

## 2013-08-11 MED ORDER — AMLODIPINE BESYLATE 5 MG PO TABS
5.0000 mg | ORAL_TABLET | Freq: Every morning | ORAL | Status: DC
  Administered 2013-08-11 – 2013-08-16 (×6): 5 mg via ORAL
  Filled 2013-08-11 (×6): qty 1

## 2013-08-11 MED ORDER — SODIUM CHLORIDE 0.9 % IJ SOLN
3.0000 mL | Freq: Two times a day (BID) | INTRAMUSCULAR | Status: DC
  Administered 2013-08-11 – 2013-08-15 (×6): 3 mL via INTRAVENOUS

## 2013-08-11 MED ORDER — COLLAGENASE 250 UNIT/GM EX OINT
1.0000 "application " | TOPICAL_OINTMENT | Freq: Every day | CUTANEOUS | Status: DC
  Filled 2013-08-11: qty 30

## 2013-08-11 MED ORDER — SODIUM CHLORIDE 0.9 % IJ SOLN: 3.0000 mL | INTRAMUSCULAR | Status: DC | PRN

## 2013-08-11 MED ORDER — ATORVASTATIN CALCIUM 40 MG PO TABS
40.0000 mg | ORAL_TABLET | Freq: Every day | ORAL | Status: DC
  Administered 2013-08-11 – 2013-08-15 (×5): 40 mg via ORAL
  Filled 2013-08-11 (×6): qty 1

## 2013-08-11 MED ORDER — ALLOPURINOL 300 MG PO TABS
300.0000 mg | ORAL_TABLET | Freq: Every day | ORAL | Status: DC
  Administered 2013-08-11 – 2013-08-15 (×6): 300 mg via ORAL
  Filled 2013-08-11 (×7): qty 1

## 2013-08-11 MED ORDER — OXYCODONE-ACETAMINOPHEN 10-325 MG PO TABS: 1.0000 | ORAL_TABLET | Freq: Four times a day (QID) | ORAL | Status: DC | PRN

## 2013-08-11 MED ORDER — OXYCODONE HCL 5 MG PO TABS
5.0000 mg | ORAL_TABLET | Freq: Four times a day (QID) | ORAL | Status: DC | PRN
  Administered 2013-08-13 – 2013-08-14 (×2): 5 mg via ORAL
  Filled 2013-08-11 (×2): qty 1

## 2013-08-11 MED ORDER — OXYCODONE-ACETAMINOPHEN 5-325 MG PO TABS
1.0000 | ORAL_TABLET | Freq: Four times a day (QID) | ORAL | Status: DC | PRN
  Administered 2013-08-11 – 2013-08-14 (×5): 1 via ORAL
  Filled 2013-08-11 (×5): qty 1

## 2013-08-11 MED ORDER — WARFARIN - PHARMACIST DOSING INPATIENT: Freq: Every day | Status: DC

## 2013-08-11 NOTE — Progress Notes (Signed)
Unit CM UR Completed by [facility] ED CM  W. Reese Senk RN  

## 2013-08-11 NOTE — Progress Notes (Signed)
[facility] PROGRESS NOTE  Tanner Pena:[id number] DOB: [birth year] DOA: 08/10/2013 PCP: Cassell Smiles., MD  Assessment/Plan: Principal Problem:   Acute on chronic combined systolic and diastolic congestive heart failure: In the context of patient with history of aortic valve replacement St. Jude's valve, CAD status post CABG in 1990 with redo in 1994 and subsequent PCI s - Patient currently on IV Lasix - Cardiology on board managing - Follow up with Daily weights, strict I's and O's  Active Problems:   DM2 (diabetes mellitus, type 2)   Accelerated hypertension on admission - Resolved, blood pressure relatively well controlled on current regimen of amlodipine, metoprolol   Hyperlipidemia LDL goal < 70 - Will continue statin   Chest pain on admission MI r/o - Cardiac enzymes x3 negative - Cardiology on board    Cardiomyopathy, ischemic - EF 25-30% by echo 5/14 -Cardiology managing - Possible Cardiac Cath pending further results from work up.    COPD (chronic obstructive pulmonary disease) - Currently compensated    LBBB (left bundle branch block)  Code Status: Full Family Communication: Discussed with daughter at bedside Disposition Plan: Pending further recommendations from cardiologist on board   Consultants:  Cardiology: Dr. Diona Fanti  Procedures:  None  Antibiotics:  None  HPI/Subjective: Pt denies any chest pain.  No new complaints.  Objective: Filed Vitals:   08/11/13 0959  BP: 124/70  Pulse: 84  Temp:   Resp:    No intake or output data in the 24 hours ending 08/11/13 1114 Filed Weights   08/10/13 1824 08/11/13 0033 08/11/13 0555  Weight: 100.699 kg (222 lb) 100.699 kg (222 lb) 100.699 kg (222 lb)    Exam:   General:  Pt has NAD, Alert and Awake  Cardiovascular: S1 and S2 WNL, no rubs  Respiratory: CTA BL, prolonged expiratory phase  Abdomen: soft, NT, ND  Musculoskeletal: no cyanosis or clubbing   Data Reviewed: Basic  Metabolic Panel:  Recent Labs Lab 08/10/13 1841 08/11/13 0155  NA 141 141  K 4.0 3.7  CL 102 105  CO2 25 25  GLUCOSE 150* 154*  BUN 19 20  CREATININE 0.90 0.96  CALCIUM 10.3 9.6   Liver Function Tests:  Recent Labs Lab 08/10/13 1841 08/11/13 0155  AST 22 17  ALT 16 13  ALKPHOS 37* 32*  BILITOT 0.5 0.4  PROT 7.7 6.8  ALBUMIN 4.3 3.9   No results found for this basename: LIPASE, AMYLASE,  in the last 168 hours No results found for this basename: AMMONIA,  in the last 168 hours CBC:  Recent Labs Lab 08/10/13 1841 08/11/13 0155  WBC 6.9 7.9  HGB 15.4 14.4  HCT 46.0 42.5  MCV 90.4 90.2  PLT 200 197   Cardiac Enzymes:  Recent Labs Lab 08/10/13 1841 08/11/13 0155 08/11/13 0700  TROPONINI <0.30 <0.30 <0.30   BNP (last 3 results)  Recent Labs  08/10/13 1841  PROBNP 784.4*   CBG:  Recent Labs Lab 08/11/13 0726  GLUCAP 146*    No results found for this or any previous visit (from the past 240 hour(s)).   Studies: Dg Chest Portable 1 View  08/10/2013   CLINICAL DATA:  Chest pain, shortness of Breath.  EXAM: PORTABLE CHEST - 1 VIEW  COMPARISON:  02/11/2013  FINDINGS: Previous median sternotomy. Mild cardiomegaly stable. Perihilar and bibasilar interstitial edema or infiltrates, new since previous exam. Blunting of left lateral costophrenic angle as before. . No definite effusion.  IMPRESSION: 1. Stable cardiomegaly with new  bilateral interstitial edema or infiltrates.   Electronically Signed   By: Oley Balm M.D.   On: 08/10/2013 19:13    Scheduled Meds: . allopurinol  300 mg Oral QHS  . amLODipine  5 mg Oral q morning - 10a  . aspirin  81 mg Oral QHS  . atorvastatin  40 mg Oral q1800  . fenofibrate  160 mg Oral Daily  . furosemide  40 mg Intravenous Q12H  . insulin aspart  0-9 Units Subcutaneous TID WC  . metoprolol succinate  125 mg Oral q morning - 10a  . nitroGLYCERIN  1 inch Topical Q6H  . potassium chloride SA  20 mEq Oral Daily  .  pregabalin  50 mg Oral QHS  . silver sulfADIAZINE  1 application Topical Daily  . sodium chloride  3 mL Intravenous Q12H   Continuous Infusions:   Principal Problem:   Acute on chronic combined systolic and diastolic congestive heart failure Active Problems:   Esophageal dysphagia   Long term (current) use of anticoagulants   H/O aortic valve replacement- St Jude   CAD - CABG '90 with re do '94 and susbequent PCIs- last Myoview 5/14 low risk   Tobacco abuse   PVD prior SFA PTA with chronic LE disease, not amenable to PTA   DM2 (diabetes mellitus, type 2)   Accelerated hypertension on admission   Hyperlipidemia LDL goal < 70   Chest pain on admission MI r/o   Cardiomyopathy, ischemic - EF 25-30% by echo 5/14   COPD (chronic obstructive pulmonary disease)   LBBB (left bundle branch block)    Time spent: > 35 minutes    [name]  [facility] Pager [phone]. If 7PM-7AM, please contact night-coverage at [email or web address], password [facility] 08/11/2013, 11:14 AM  LOS: 1 day

## 2013-08-11 NOTE — Consult Note (Signed)
Call from bedside nurse. Pt has longstanding history of left great toe ulceration.  MD has ordered both Santyl (debridement ointment) and silvadene (antimicrobial ointment).  I have discussed the current status of the wound with the bedside nurse and reviewed the providers notes. Would recommend use of silvadene only, will discontinue orders for Santyl at this time.  Marquavis Hannen Kistler RN,CWOCN [phone]

## 2013-08-11 NOTE — Progress Notes (Signed)
ANTICOAGULATION CONSULT NOTE - Initial Consult  Pharmacy Consult for Warfarin  Indication: Mechancial aortic valve  No Known Allergies  Patient Measurements: Height: 5\' 10"  (177.8 cm) Weight: 222 lb (100.699 kg) IBW/kg (Calculated) : 73  Vital Signs: Temp: 98.2 F (36.8 C) (12/24 0033) Temp src: Oral (12/24 0033) BP: 161/82 mmHg (12/24 0033) Pulse Rate: 68 (12/24 0033)  Labs:  Recent Labs  08/10/13 1841  HGB 15.4  HCT 46.0  PLT 200  LABPROT 30.7*  INR 3.08*  CREATININE 0.90  TROPONINI <0.30   Estimated Creatinine Clearance: 88.3 ml/min (by C-G formula based on Cr of 0.9).  Medical History: Past Medical History  Diagnosis Date  . Diabetes mellitus 2007  . Hypertension   . Gout   . Hypercholesteremia   . Peripheral vascular disease     stents lower extremity  . Chronic pain     foot, hips  . Sleep apnea   . COPD (chronic obstructive pulmonary disease)   . 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 Chronic back pain   . Dysphagia   . Neuromuscular disorder   . Peripheral neuropathy   . Critical lower limb ischemia     Medications:  Warfarin PTA Dose: 7.5 mg Mon/Fri, 5 mg all other days  Assessment: 72 y/o here with CP, on warfarin PTA for mechanical AVR, INR is 3.08, other labs as above.   Goal of Therapy:  INR 2.5-3.5 (per outpatient anticoagulation notes) Monitor platelets by anticoagulation protocol: Yes   Plan:  -Warfarin per home regimen -Daily PT/INR, adjust dose as needed -Monitor for bleeding  Thank you for allowing me to take part in this patient's care,  Abran Duke, PharmD Clinical Pharmacist Phone: [phone] Pager: [phone] 08/11/2013 12:47 AM

## 2013-08-11 NOTE — Progress Notes (Signed)
Hold Coumadin and start Heparin in anticipation of possible cath per Dr Allyson Sabal.  Corine Shelter PA-C 08/11/2013 10:46 AM Agree with note written by Corine Shelter [facility]  Runell Gess 08/11/2013 4:33 PM

## 2013-08-11 NOTE — Plan of Care (Signed)
Problem: Phase I Progression Outcomes Goal: Aspirin unless contraindicated Outcome: Completed/Met Date Met:  08/11/13 Pt given 324 mg of ASA at [facility]

## 2013-08-11 NOTE — Progress Notes (Signed)
ANTICOAGULATION CONSULT NOTE   Pharmacy Consult for Warfarin >>heparin Indication: Mechancial aortic valve>>possible ACS now cath is planned  No Known Allergies  Patient Measurements: Height: 5\' 10"  (177.8 cm) Weight: 222 lb (100.699 kg) IBW/kg (Calculated) : 73  Vital Signs: Temp: 98.3 F (36.8 C) (12/24 0555) Temp src: Oral (12/24 0555) BP: 124/70 mmHg (12/24 0959) Pulse Rate: 84 (12/24 0959)  Labs:  Recent Labs  08/10/13 1841 08/11/13 0155 08/11/13 0700  HGB 15.4 14.4  --   HCT 46.0 42.5  --   PLT 200 197  --   LABPROT 30.7* 29.4*  --   INR 3.08* 2.91*  --   CREATININE 0.90 0.96  --   TROPONINI <0.30 <0.30 <0.30   Estimated Creatinine Clearance: 82.7 ml/min (by C-G formula based on Cr of 0.96).  Medical History: Past Medical History  Diagnosis Date  . Diabetes mellitus 2007  . Hypertension   . Gout   . Hypercholesteremia   . Peripheral vascular disease     stents lower extremity  . Chronic pain     foot, hips  . Sleep apnea   . COPD (chronic obstructive pulmonary disease)   . 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 Chronic back pain   . Dysphagia   . Neuromuscular disorder   . Peripheral neuropathy   . Critical lower limb ischemia     Medications:  Warfarin PTA Dose: 7.5 mg Mon/Fri, 5 mg all other days  Assessment: 72 y/o here with CP, on warfarin PTA for mechanical AVR, INR was 3.0 on admit and is now down to 2.9. Orders received to start IV heparin when INR <2.5 and plan is to proceed with cath when able. Will check INR in am and start heparin when appropriate.   Goal of Therapy:  INR 2.5-3.5 (per outpatient anticoagulation notes) Heparin level 0.3-0.7 Monitor platelets by anticoagulation protocol: Yes   Plan:  -Hold Warfarin for now -Start IV heparin once INR falls below 2.5 -Daily PT/INR -Monitor for bleeding  Thank you for allowing me to take part in this patient's care,  Sheppard Coil PharmD., BCPS Clinical  Pharmacist Pager [phone] 08/11/2013 10:57 AM

## 2013-08-11 NOTE — Consult Note (Signed)
Reason for Consult: Chest pain  Requesting Physician: [facility]  HPI: The patient is a 72 year old, moderately overweight, married, Caucasian male father of 4, grandfather to 8 grandchildren. He is a widower and [place]. His daughters live nearby. The patient is a cardiology patient of Dr. Devona Konig who Dr Allyson Sabal follows for peripheral vascular disease. He has a history of CAD status post bypass grafting in 1990 with a St. Jude AVR at that time. He had redo CABG in 1994. He has had percutaneous intervention to his vein grafts. His last Myoview 5/14 was intermediate risk and he was treated medically.  His EF by echo 5/14 was 25-30%.        Dr Allyson Sabal has previously stented his SFAs bilaterally, as well as his right external iliac artery. He has a nonhealing ulcer on his left great toe with some cyanosis of his left foot. The patient had abdominal aortography with bilateral runoff and potential endovascular therapy for critical limb ischemia on 02/15/13 revealing patent SFA stents bilaterally with severe infrapopliteal disease. He has been treated conservatively and this has been slowly improving. He can now wear shoes..        His other problems include continued tobacco abuse, hypertension, COPD, hyperlipidemia, type 2 diabetes, and obstructive sleep apnea.          He apparently ran out of his Lasix a week ago. Yesterday he had Lt chest pain while feeding his chickens. This lasted about 15 minutes. He then took a walk to a friends house and by the time he got there he had severe SSCP and asked her to call EMS. His Troponin's are negative X 3. His CXR suggested CHF, BNP 748.  He is currently pain free. His B/P on admission was 206/117.     PMHx:  Past Medical History  Diagnosis Date  . Diabetes mellitus 2007  . Hypertension   . Gout   . Hypercholesteremia   . Peripheral vascular disease     stents lower extremity  . Chronic pain     foot, hips  . Sleep apnea   . COPD (chronic  obstructive pulmonary disease)   . 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 Chronic back pain   . Dysphagia   . Neuromuscular disorder   . Peripheral neuropathy   . Critical lower limb ischemia    Past Surgical History  Procedure Laterality Date  . Back surgery  [id number]    2  . Open heart surgery  1990    prosthetic heart valve, one bypass  . Rotator cuff repair      right  . Cataract extraction      bilateral  . Coronary stent placement  2005    RCA vein graft A 3.0x13.0 TAXUS stent was then placed int he vessel a Viva 3.0x4.0 (perfusion balloon was made ready it was placed through the entire lenght of the stent  . Peripheral vascular procedures lower extremities      right external iliac  artery PTA and stenting as well as bilateral SFA intervention remotely. Repeat procedures in 2011 bilaterally  . Coronary artery bypass graft  1994    6 vessels  . [name]  06/13/2011    Procedure: Elease Hashimoto DILATION;  Surgeon: Corbin Ade, MD;  Location: [facility];  Service: Endoscopy;  Laterality: N/A;  Dilated to 56.   . Angioplasty illiac artery      FAMHx: positive for colon cancer  SOCHx:  reports that he has been smoking Cigarettes.  He started smoking about 60 years ago. He has a 55 pack-year smoking history. He quit smokeless tobacco use about 17 years ago. His smokeless tobacco use included Chew. He reports that he drinks alcohol. He reports that he does not use illicit drugs.  ALLERGIES: No Known Allergies  ROS: Pertinent items are noted in HPI. See H&P for complete ROS  HOME MEDICATIONS: Prescriptions prior to admission  Medication Sig Dispense Refill  . albuterol (PROVENTIL) 2 MG tablet Take 1 tablet by mouth 2 (two) times daily.      [name] albuterol-ipratropium (COMBIVENT) 18-103 MCG/ACT inhaler Inhale 1 puff into the lungs 4 (four) times daily. Coughing/ Shortness of Breath      . allopurinol (ZYLOPRIM) 300 MG tablet Take 300 mg by mouth at bedtime.        [name] amLODipine (NORVASC) 5 MG tablet Take 5 mg by mouth every morning.       [name] aspirin 81 MG tablet Take 81 mg by mouth at bedtime.       . fenofibrate (TRICOR) 145 MG tablet Take 1 tablet (145 mg total) by mouth daily.  30 tablet  6  . fish oil-omega-3 fatty acids 1000 MG capsule Take 1 capsule (1 g total) by mouth 2 (two) times daily.      . furosemide (LASIX) 80 MG tablet Take 40 mg by mouth daily.       [name] glimepiride (AMARYL) 2 MG tablet Take 2 mg by mouth 2 (two) times daily.       . Iodine 2-2.4 % SOLN Apply 1 application topically daily. Applied to affected foot      . isosorbide mononitrate (IMDUR) 30 MG 24 hr tablet Take 1 tablet (30 mg total) by mouth daily.  30 tablet  5  . metoprolol succinate (TOPROL-XL) 50 MG 24 hr tablet Take 125 mg by mouth every morning. 2 and one-half tablet (125mg  total) taken once daily. Take with or immediately following a meal.      . oxyCODONE-acetaminophen (PERCOCET) 10-325 MG per tablet Take 1 tablet by mouth every 6 (six) hours as needed for pain.       . potassium chloride SA (K-DUR,KLOR-CON) 20 MEQ tablet Take 20 mEq by mouth daily.        . pregabalin (LYRICA) 50 MG capsule Take 50 mg by mouth at bedtime.       . rosuvastatin (CRESTOR) 20 MG tablet Take 1 tablet (20 mg total) by mouth at bedtime.  30 tablet  11  . SANTYL ointment Apply 1 application topically daily.       . sitaGLIPtin (JANUVIA) 100 MG tablet Take 100 mg by mouth every morning.       [name] SSD 1 % cream Apply 1 application topically daily.       [name] warfarin (COUMADIN) 5 MG tablet Take 5-7.5 mg by mouth See admin instructions. Takes one tablet (5mg  total) every day but takes 7.5mg  total on Mondays and Fridays        HOSPITAL MEDICATIONS: I have reviewed the patient's current medications.  VITALS: Blood pressure 132/80, pulse 83, temperature 98.3 F (36.8 C), temperature source Oral, resp. rate 18, height 5\' 10"  (1.778 m), weight 222 lb (100.699 kg), SpO2 93.00%.  PHYSICAL  EXAM: General appearance: alert, cooperative, no distress and moderately obese Neck: soft bruits Lungs: decreased breath sounds Heart: regular rate and rhythm Abdomen: obese Extremities: no edema, healing Lt great toe ulcer Pulses:  diminnished Skin: cool and dry Neurologic: Grossly normal  LABS: Results for orders placed during the hospital encounter of 08/10/13 (from the past 48 hour(s))  CBC     Status: None   Collection Time    08/10/13  6:41 PM      Result Value Range   WBC 6.9  4.0 - 10.5 K/uL   RBC 5.09  4.22 - 5.81 MIL/uL   Hemoglobin 15.4  13.0 - 17.0 g/dL   HCT 62.1  30.8 - 65.7 %   MCV 90.4  78.0 - 100.0 fL   MCH 30.3  26.0 - 34.0 pg   MCHC 33.5  30.0 - 36.0 g/dL   RDW 84.6  96.2 - 95.2 %   Platelets 200  150 - 400 K/uL  COMPREHENSIVE METABOLIC PANEL     Status: Abnormal   Collection Time    08/10/13  6:41 PM      Result Value Range   Sodium 141  135 - 145 mEq/L   Potassium 4.0  3.5 - 5.1 mEq/L   Chloride 102  96 - 112 mEq/L   CO2 25  19 - 32 mEq/L   Glucose, Bld 150 (*) 70 - 99 mg/dL   BUN 19  6 - 23 mg/dL   Creatinine, Ser 8.41  0.50 - 1.35 mg/dL   Calcium 32.4  8.4 - 40.1 mg/dL   Total Protein 7.7  6.0 - 8.3 g/dL   Albumin 4.3  3.5 - 5.2 g/dL   AST 22  0 - 37 U/L   ALT 16  0 - 53 U/L   Alkaline Phosphatase 37 (*) 39 - 117 U/L   Total Bilirubin 0.5  0.3 - 1.2 mg/dL   GFR calc non Af Amer 83 (*) >90 mL/min   GFR calc Af Amer >90  >90 mL/min   Comment: (NOTE)     The eGFR has been calculated using the CKD EPI equation.     This calculation has not been validated in all clinical situations.     eGFR's persistently <90 mL/min signify possible Chronic Kidney     Disease.  TROPONIN I     Status: None   Collection Time    08/10/13  6:41 PM      Result Value Range   Troponin I <0.30  <0.30 ng/mL   Comment:            Due to the release kinetics of cTnI,     a negative result within the first hours     of the onset of symptoms does not rule out      myocardial infarction with certainty.     If myocardial infarction is still suspected,     repeat the test at appropriate intervals.  PROTIME-INR     Status: Abnormal   Collection Time    08/10/13  6:41 PM      Result Value Range   Prothrombin Time 30.7 (*) 11.6 - 15.2 seconds   INR 3.08 (*) 0.00 - 1.49  PRO B NATRIURETIC PEPTIDE     Status: Abnormal   Collection Time    08/10/13  6:41 PM      Result Value Range   Pro B Natriuretic peptide (BNP) 784.4 (*) 0 - 125 pg/mL  TROPONIN I     Status: None   Collection Time    08/11/13  1:55 AM      Result Value Range   Troponin I <0.30  <0.30 ng/mL   Comment:  Due to the release kinetics of cTnI,     a negative result within the first hours     of the onset of symptoms does not rule out     myocardial infarction with certainty.     If myocardial infarction is still suspected,     repeat the test at appropriate intervals.  CBC     Status: None   Collection Time    08/11/13  1:55 AM      Result Value Range   WBC 7.9  4.0 - 10.5 K/uL   RBC 4.71  4.22 - 5.81 MIL/uL   Hemoglobin 14.4  13.0 - 17.0 g/dL   HCT 16.1  09.6 - 04.5 %   MCV 90.2  78.0 - 100.0 fL   MCH 30.6  26.0 - 34.0 pg   MCHC 33.9  30.0 - 36.0 g/dL   RDW 40.9  81.1 - 91.4 %   Platelets 197  150 - 400 K/uL  COMPREHENSIVE METABOLIC PANEL     Status: Abnormal   Collection Time    08/11/13  1:55 AM      Result Value Range   Sodium 141  135 - 145 mEq/L   Potassium 3.7  3.5 - 5.1 mEq/L   Chloride 105  96 - 112 mEq/L   CO2 25  19 - 32 mEq/L   Glucose, Bld 154 (*) 70 - 99 mg/dL   BUN 20  6 - 23 mg/dL   Creatinine, Ser 7.82  0.50 - 1.35 mg/dL   Calcium 9.6  8.4 - 95.6 mg/dL   Total Protein 6.8  6.0 - 8.3 g/dL   Albumin 3.9  3.5 - 5.2 g/dL   AST 17  0 - 37 U/L   ALT 13  0 - 53 U/L   Alkaline Phosphatase 32 (*) 39 - 117 U/L   Total Bilirubin 0.4  0.3 - 1.2 mg/dL   GFR calc non Af Amer 81 (*) >90 mL/min   GFR calc Af Amer >90  >90 mL/min   Comment: (NOTE)      The eGFR has been calculated using the CKD EPI equation.     This calculation has not been validated in all clinical situations.     eGFR's persistently <90 mL/min signify possible Chronic Kidney     Disease.  PROTIME-INR     Status: Abnormal   Collection Time    08/11/13  1:55 AM      Result Value Range   Prothrombin Time 29.4 (*) 11.6 - 15.2 seconds   INR 2.91 (*) 0.00 - 1.49  TROPONIN I     Status: None   Collection Time    08/11/13  7:00 AM      Result Value Range   Troponin I <0.30  <0.30 ng/mL   Comment:            Due to the release kinetics of cTnI,     a negative result within the first hours     of the onset of symptoms does not rule out     myocardial infarction with certainty.     If myocardial infarction is still suspected,     repeat the test at appropriate intervals.  GLUCOSE, CAPILLARY     Status: Abnormal   Collection Time    08/11/13  7:26 AM      Result Value Range   Glucose-Capillary 146 (*) 70 - 99 mg/dL    EKG: NSR, ST, LBBB  IMAGING: Dg Chest  Portable 1 View  08/10/2013   CLINICAL DATA:  Chest pain, shortness of Breath.  EXAM: PORTABLE CHEST - 1 VIEW  COMPARISON:  02/11/2013  FINDINGS: Previous median sternotomy. Mild cardiomegaly stable. Perihilar and bibasilar interstitial edema or infiltrates, new since previous exam. Blunting of left lateral costophrenic angle as before. . No definite effusion.  IMPRESSION: 1. Stable cardiomegaly with new bilateral interstitial edema or infiltrates.   Electronically Signed   By: Oley Balm M.D.   On: 08/10/2013 19:13    IMPRESSION: Principal Problem:   Acute on chronic combined systolic and diastolic congestive heart failure Active Problems:   Accelerated hypertension on admission   Chest pain on admission MI r/o   H/O aortic valve replacement- St Jude   CAD - CABG '90 with re do '94 and susbequent PCIs- last Myoview 5/14 low risk   PVD prior SFA PTA with chronic LE disease, not amenable to PTA   DM2  (diabetes mellitus, type 2)   Cardiomyopathy, ischemic - EF 25-30% by echo 5/14   Esophageal dysphagia   Long term (current) use of anticoagulants   Tobacco abuse   Hyperlipidemia LDL goal < 70   COPD (chronic obstructive pulmonary disease)   LBBB (left bundle branch block)   RECOMMENDATION: He is on NTG paste and his Lasix has been resumed. Will obtain Myoview while he is here. Notet: his last PCI in 2005 was a complicated procedure to a vein graft from 1990. Would consider cath if Myoview high risk, otherwise medical Rx if possible. Will not stop Coumadin.  Time Spent Directly with Patient: 45 minutes  Abelino Derrick [phone] beeper 08/11/2013, 9:04 AM  Agree with note written by Corine Shelter PAC  Pt well-known to me with a for coronary bypass grafting and aortic valve replacement. Patient of Dr. Glenna Durand toes. He also critical limb ischemia with severe PAD. He was on Coumadin anticoagulation. He does have significant LV dysfunction. His last Cath was approximately 10 years ago. He had a Myoview stress test performed in May of this year at that intermediate risk with inferolateral ischemia.[place]oz unstable angina. Left bundle-branch block which is chronic. His enzymes are negative. His INR is therapeutic. His exam is benign. He does have some failure on chest x-ray probably related to not taking his diuretic over the last week since he went on. Presently to perform a Myoview stress test and if significantly abnormal he may require diagnostic coronary arteriography. His Coumadin in anticipation of that.  Runell Gess 08/11/2013 10:42 AM

## 2013-08-11 NOTE — Progress Notes (Signed)
Per pt he has not had any dysphagia since maloney dilation. Swallow screen done at bedside and pt passed. Verified diet order with MD on call. Pt started on a carb modified diet. Sanda Linger, RN

## 2013-08-12 DIAGNOSIS — F17200 Nicotine dependence, unspecified, uncomplicated: Secondary | ICD-10-CM

## 2013-08-12 DIAGNOSIS — J449 Chronic obstructive pulmonary disease, unspecified: Secondary | ICD-10-CM

## 2013-08-12 LAB — GLUCOSE, CAPILLARY
Glucose-Capillary: 152 mg/dL — ABNORMAL HIGH (ref 70–99)
Glucose-Capillary: 152 mg/dL — ABNORMAL HIGH (ref 70–99)
Glucose-Capillary: 170 mg/dL — ABNORMAL HIGH (ref 70–99)

## 2013-08-12 LAB — PROTIME-INR
INR: 2.08 — ABNORMAL HIGH (ref 0.00–1.49)
Prothrombin Time: 22.7 seconds — ABNORMAL HIGH (ref 11.6–15.2)

## 2013-08-12 MED ORDER — HEPARIN (PORCINE) IN NACL 100-0.45 UNIT/ML-% IJ SOLN
1600.0000 [IU]/h | INTRAMUSCULAR | Status: DC
  Administered 2013-08-12: 1600 [IU]/h via INTRAVENOUS
  Administered 2013-08-12: 1300 [IU]/h via INTRAVENOUS
  Administered 2013-08-13 – 2013-08-14 (×2): 1600 [IU]/h via INTRAVENOUS
  Filled 2013-08-12 (×9): qty 250

## 2013-08-12 MED ORDER — HEPARIN BOLUS VIA INFUSION
2700.0000 [IU] | Freq: Once | INTRAVENOUS | Status: AC
  Administered 2013-08-12: 2700 [IU] via INTRAVENOUS
  Filled 2013-08-12: qty 2700

## 2013-08-12 NOTE — Progress Notes (Signed)
Subjective:  Pain free presently  Objective:   Vital Signs in the last 24 hours: Temp:  [97.6 F (36.4 C)-98 F (36.7 C)] 97.6 F (36.4 C) (12/25 0543) Pulse Rate:  [54-84] 74 (12/25 0929) Resp:  [18] 18 (12/25 0543) BP: (124-146)/(56-70) 134/56 mmHg (12/25 0543) SpO2:  [95 %-96 %] 95 % (12/25 0543) Weight:  [214 lb 14.4 oz (97.478 kg)] 214 lb 14.4 oz (97.478 kg) (12/25 0543)  Intake/Output from previous day: 12/24 0701 - 12/25 0700 In: 360 [P.O.:360] Out: 2200 [Urine:2200]  Medications: . allopurinol  300 mg Oral QHS  . amLODipine  5 mg Oral q morning - 10a  . aspirin  81 mg Oral QHS  . atorvastatin  40 mg Oral q1800  . fenofibrate  160 mg Oral Daily  . furosemide  40 mg Intravenous Q12H  . insulin aspart  0-9 Units Subcutaneous TID WC  . metoprolol succinate  125 mg Oral q morning - 10a  . nitroGLYCERIN  1 inch Topical Q6H  . potassium chloride SA  20 mEq Oral Daily  . pregabalin  50 mg Oral QHS  . silver sulfADIAZINE  1 application Topical Daily  . sodium chloride  3 mL Intravenous Q12H    . heparin 1,300 Units/hr (08/12/13 0758)    Physical Exam:   General appearance: alert, cooperative and no distress Neck: no adenopathy, no carotid bruit, no JVD, supple, symmetrical, trachea midline and thyroid not enlarged, symmetric, no tenderness/mass/nodules Lungs: decreased BS at bases Heart: crisp valve click; 2/6 sem  Abdomen: soft, non-tender; bowel sounds normal; no masses,  no organomegaly Extremities: no edema, redness or tenderness in the calves or thighs Skin: Skin color, texture, turgor normal. No rashes or lesions   Rate: 58  Rhythm: sinus bradycardia, LBBB  Lab Results:    Recent Labs  08/10/13 1841 08/11/13 0155  NA 141 141  K 4.0 3.7  CL 102 105  CO2 25 25  GLUCOSE 150* 154*  BUN 19 20  CREATININE 0.90 0.96    Recent Labs  08/11/13 0700 08/11/13 1240  TROPONINI <0.30 <0.30   Hepatic Function Panel  Recent Labs  08/11/13 0155    PROT 6.8  ALBUMIN 3.9  AST 17  ALT 13  ALKPHOS 32*  BILITOT 0.4    Recent Labs  08/12/13 0440  INR 2.08*   BNP (last 3 results)  Recent Labs  08/10/13 1841  PROBNP 784.4*    Lipid Panel     Component Value Date/Time   TRIG 219* 01/07/2013 1058   LDLCALC 50 01/07/2013 1058      Imaging:  Dg Chest Portable 1 View  08/10/2013   CLINICAL DATA:  Chest pain, shortness of Breath.  EXAM: PORTABLE CHEST - 1 VIEW  COMPARISON:  02/11/2013  FINDINGS: Previous median sternotomy. Mild cardiomegaly stable. Perihilar and bibasilar interstitial edema or infiltrates, new since previous exam. Blunting of left lateral costophrenic angle as before. . No definite effusion.  IMPRESSION: 1. Stable cardiomegaly with new bilateral interstitial edema or infiltrates.   Electronically Signed   By: Oley Balm M.D.   On: 08/10/2013 19:13      Assessment/Plan:   Principal Problem:   Acute on chronic combined systolic and diastolic congestive heart failure Active Problems:   Esophageal dysphagia   Long term (current) use of anticoagulants   H/O aortic valve replacement- St Jude   CAD - CABG '90 with re do '94 and susbequent PCIs- last Myoview 5/14 low risk   Tobacco  abuse   PVD prior SFA PTA with chronic LE disease, not amenable to PTA   DM2 (diabetes mellitus, type 2)   Accelerated hypertension on admission   Hyperlipidemia LDL goal < 70   Chest pain on admission MI r/o   Cardiomyopathy, ischemic - EF 25-30% by echo 5/14   COPD (chronic obstructive pulmonary disease)   LBBB (left bundle branch block)   Initial enzymes are negative; chest pain  resolved.  For myoview tomorrrow. INR 2.05 today. Coumadin on hold in event positive myoview, then will need cath.   Lennette Bihari, MD, [facility] 08/12/2013, 9:50 AM

## 2013-08-12 NOTE — Progress Notes (Signed)
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 in anticipation of possible cath  No Known Allergies  Patient Measurements: Height: 5\' 10"  (177.8 cm) Weight: 214 lb 14.4 oz (97.478 kg) IBW/kg (Calculated) : 73 HDW: ~93kg  Vital Signs: Temp: 97.6 F (36.4 C) (12/25 0543) Temp src: Oral (12/25 0543) BP: 134/56 mmHg (12/25 0543) Pulse Rate: 54 (12/25 0543)  Labs:  Recent Labs  08/10/13 1841 08/11/13 0155 08/11/13 0700 08/11/13 1240 08/12/13 0440  HGB 15.4 14.4  --   --   --   HCT 46.0 42.5  --   --   --   PLT 200 197  --   --   --   LABPROT 30.7* 29.4*  --   --  22.7*  INR 3.08* 2.91*  --   --  2.08*  CREATININE 0.90 0.96  --   --   --   TROPONINI <0.30 <0.30 <0.30 <0.30  --     Estimated Creatinine Clearance: 81.5 ml/min (by C-G formula based on Cr of 0.96).  Assessment: 72 y/o with CP, warfarin PTA for mechanical AVR on hold, starting heparin this AM as INR is 2.08, plans for cath when able.   Goal of Therapy:  Heparin level 0.3-0.7 units/ml Monitor platelets by anticoagulation protocol: Yes   Plan:  -Start heparin at 1300 units/hr (no bolus given INR) -8 hour HL at 1530 -Daily CBC/HL -Monitor for bleeding -F/U cath plans  Thank you for allowing me to take part in this patient's care,  Abran Duke, PharmD Clinical Pharmacist Phone: [phone] Pager: [phone] 08/12/2013 7:02 AM

## 2013-08-12 NOTE — Progress Notes (Signed)
[facility] PROGRESS NOTE  Tanner Pena ZOX:[id number] DOB: Apr 24, 1941 DOA: 08/10/2013 PCP: Cassell Smiles., MD  Assessment/Plan: Principal Problem:   Acute on chronic combined systolic and diastolic congestive heart failure: In the context of patient with history of aortic valve replacement St. Jude's valve, CAD status post CABG in 1990 with redo in 1994 and subsequent PCI s - Patient currently on IV Lasix - Cardiology on board managing, currently awaiting Myoview to decide if patient will require further evaluation with cath. - Follow up with Daily weights, strict I's and O's  Active Problems:   DM2 (diabetes mellitus, type 2) - Continue diabetic diet -Patient on sliding scale insulin    Accelerated hypertension on admission - Resolved, blood pressure relatively well controlled on current regimen of amlodipine, metoprolol    Hyperlipidemia LDL goal < 70 - Will continue statin    Chest pain on admission MI r/o - Cardiac enzymes x3 negative - Cardiology on board    Cardiomyopathy, ischemic - EF 25-30% by echo 5/14 -Cardiology managing - Possible Cardiac Cath pending further results from work up as indicated above    COPD (chronic obstructive pulmonary disease) - Currently compensated,    LBBB (left bundle branch block)  Code Status: Full Family Communication: Discussed with daughter at bedside Disposition Plan: Pending further recommendations from cardiologist on board   Consultants:  Cardiology: Dr. Tresa Endo  Procedures:  None  Antibiotics:  None  HPI/Subjective: Pt denies any chest pain.  No new complaints.  Objective: Filed Vitals:   08/12/13 1333  BP: 132/65  Pulse: 54  Temp: 98.6 F (37 C)  Resp: 16    Intake/Output Summary (Last 24 hours) at 08/12/13 1458 Last data filed at 08/12/13 1300  Gross per 24 hour  Intake    960 ml  Output   2600 ml  Net  -1640 ml   Filed Weights   08/11/13 0033 08/11/13 0555 08/12/13 0543  Weight:  100.699 kg (222 lb) 100.699 kg (222 lb) 97.478 kg (214 lb 14.4 oz)    Exam:   General:  Pt has NAD, Alert and Awake  Cardiovascular: S1 and S2 WNL, no rubs  Respiratory: CTA BL, prolonged expiratory phase  Abdomen: soft, NT, ND  Musculoskeletal: no cyanosis or clubbing   Data Reviewed: Basic Metabolic Panel:  Recent Labs Lab 08/10/13 1841 08/11/13 0155  NA 141 141  K 4.0 3.7  CL 102 105  CO2 25 25  GLUCOSE 150* 154*  BUN 19 20  CREATININE 0.90 0.96  CALCIUM 10.3 9.6   Liver Function Tests:  Recent Labs Lab 08/10/13 1841 08/11/13 0155  AST 22 17  ALT 16 13  ALKPHOS 37* 32*  BILITOT 0.5 0.4  PROT 7.7 6.8  ALBUMIN 4.3 3.9   No results found for this basename: LIPASE, AMYLASE,  in the last 168 hours No results found for this basename: AMMONIA,  in the last 168 hours CBC:  Recent Labs Lab 08/10/13 1841 08/11/13 0155  WBC 6.9 7.9  HGB 15.4 14.4  HCT 46.0 42.5  MCV 90.4 90.2  PLT 200 197   Cardiac Enzymes:  Recent Labs Lab 08/10/13 1841 08/11/13 0155 08/11/13 0700 08/11/13 1240  TROPONINI <0.30 <0.30 <0.30 <0.30   BNP (last 3 results)  Recent Labs  08/10/13 1841  PROBNP 784.4*   CBG:  Recent Labs Lab 08/11/13 1132 08/11/13 1629 08/11/13 2109 08/12/13 0729 08/12/13 1144  GLUCAP 144* 148* 155* 152* 236*    No results found for this or any  previous visit (from the past 240 hour(s)).   Studies: Dg Chest Portable 1 View  08/10/2013   CLINICAL DATA:  Chest pain, shortness of Breath.  EXAM: PORTABLE CHEST - 1 VIEW  COMPARISON:  02/11/2013  FINDINGS: Previous median sternotomy. Mild cardiomegaly stable. Perihilar and bibasilar interstitial edema or infiltrates, new since previous exam. Blunting of left lateral costophrenic angle as before. . No definite effusion.  IMPRESSION: 1. Stable cardiomegaly with new bilateral interstitial edema or infiltrates.   Electronically Signed   By: Oley Balm M.D.   On: 08/10/2013 19:13    Scheduled  Meds: . allopurinol  300 mg Oral QHS  . amLODipine  5 mg Oral q morning - 10a  . aspirin  81 mg Oral QHS  . atorvastatin  40 mg Oral q1800  . fenofibrate  160 mg Oral Daily  . furosemide  40 mg Intravenous Q12H  . insulin aspart  0-9 Units Subcutaneous TID WC  . metoprolol succinate  125 mg Oral q morning - 10a  . nitroGLYCERIN  1 inch Topical Q6H  . potassium chloride SA  20 mEq Oral Daily  . pregabalin  50 mg Oral QHS  . silver sulfADIAZINE  1 application Topical Daily  . sodium chloride  3 mL Intravenous Q12H   Continuous Infusions: . heparin 1,300 Units/hr (08/12/13 0758)    Principal Problem:   Acute on chronic combined systolic and diastolic congestive heart failure Active Problems:   Esophageal dysphagia   Long term (current) use of anticoagulants   H/O aortic valve replacement- St Jude   CAD - CABG '90 with re do '94 and susbequent PCIs- last Myoview 5/14 low risk   Tobacco abuse   PVD prior SFA PTA with chronic LE disease, not amenable to PTA   DM2 (diabetes mellitus, type 2)   Accelerated hypertension on admission   Hyperlipidemia LDL goal < 70   Chest pain on admission MI r/o   Cardiomyopathy, ischemic - EF 25-30% by echo 5/14   COPD (chronic obstructive pulmonary disease)   LBBB (left bundle branch block)    Time spent: > 35 minutes    [name]  [facility] Pager [phone]. If 7PM-7AM, please contact night-coverage at [email or web address], password [facility] 08/12/2013, 2:58 PM  LOS: 2 days

## 2013-08-12 NOTE — Plan of Care (Signed)
Problem: Phase II Progression Outcomes Goal: Stress Test if indicated Outcome: Progressing Stress Test planned for 08/13/2013.

## 2013-08-12 NOTE — Progress Notes (Signed)
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 in anticipation of possible cath  No Known Allergies  Patient Measurements: Height: 5\' 10"  (177.8 cm) Weight: 214 lb 14.4 oz (97.478 kg) IBW/kg (Calculated) : 73 HDW: ~93kg  Vital Signs: Temp: 98.6 F (37 C) (12/25 1333) Temp src: Oral (12/25 1333) BP: 132/65 mmHg (12/25 1333) Pulse Rate: 54 (12/25 1333)  Labs:  Recent Labs  08/10/13 1841 08/11/13 0155 08/11/13 0700 08/11/13 1240 08/12/13 0440 08/12/13 1430  HGB 15.4 14.4  --   --   --   --   HCT 46.0 42.5  --   --   --   --   PLT 200 197  --   --   --   --   LABPROT 30.7* 29.4*  --   --  22.7*  --   INR 3.08* 2.91*  --   --  2.08*  --   HEPARINUNFRC  --   --   --   --   --  0.16*  CREATININE 0.90 0.96  --   --   --   --   TROPONINI <0.30 <0.30 <0.30 <0.30  --   --     Estimated Creatinine Clearance: 81.5 ml/min (by C-G formula based on Cr of 0.96).  Assessment: 72 y/o with CP, warfarin PTA for mechanical AVR on hold, starting heparin this AM as INR is 2.08, plans for cath when able. HL subtherapeutic.  No signs of bleeding.  Will bolus and increase rate.   Goal of Therapy:  Heparin level 0.3-0.7 units/ml Monitor platelets by anticoagulation protocol: Yes   Plan:  -Heparin bolus 2700 units then increase rate to 1600 units/hr  -8 hour HL at 0030 -Daily CBC/HL -Monitor for bleeding -F/U cath plans  Thank you for allowing me to take part in this patient's care,  Piedad Climes, PharmD Clinical Pharmacist - Resident Pager: [phone] Pharmacy: [phone] 08/12/2013 3:41 PM

## 2013-08-13 ENCOUNTER — Inpatient Hospital Stay (HOSPITAL_COMMUNITY): Payer: PRIVATE HEALTH INSURANCE

## 2013-08-13 DIAGNOSIS — R079 Chest pain, unspecified: Secondary | ICD-10-CM

## 2013-08-13 DIAGNOSIS — I739 Peripheral vascular disease, unspecified: Secondary | ICD-10-CM

## 2013-08-13 LAB — GLUCOSE, CAPILLARY
Glucose-Capillary: 155 mg/dL — ABNORMAL HIGH (ref 70–99)
Glucose-Capillary: 205 mg/dL — ABNORMAL HIGH (ref 70–99)
Glucose-Capillary: 158 mg/dL — ABNORMAL HIGH (ref 70–99)

## 2013-08-13 LAB — HEPARIN LEVEL (UNFRACTIONATED): Heparin Unfractionated: 0.5 IU/mL (ref 0.30–0.70)

## 2013-08-13 LAB — PROTIME-INR: Prothrombin Time: 19.4 seconds — ABNORMAL HIGH (ref 11.6–15.2)

## 2013-08-13 MED ORDER — REGADENOSON 0.4 MG/5ML IV SOLN
INTRAVENOUS | Status: AC
  Administered 2013-08-13: 0.4 mg
  Filled 2013-08-13: qty 5

## 2013-08-13 MED ORDER — WARFARIN - PHARMACIST DOSING INPATIENT: Freq: Every day | Status: DC

## 2013-08-13 MED ORDER — METOPROLOL SUCCINATE ER 50 MG PO TB24
75.0000 mg | ORAL_TABLET | Freq: Every day | ORAL | Status: DC
  Administered 2013-08-13 – 2013-08-16 (×3): 75 mg via ORAL
  Filled 2013-08-13 (×4): qty 1

## 2013-08-13 MED ORDER — TECHNETIUM TC 99M SESTAMIBI GENERIC - CARDIOLITE
30.0000 | Freq: Once | INTRAVENOUS | Status: AC | PRN
  Administered 2013-08-13: 30 via INTRAVENOUS

## 2013-08-13 MED ORDER — WARFARIN SODIUM 10 MG PO TABS
10.0000 mg | ORAL_TABLET | Freq: Once | ORAL | Status: AC
  Administered 2013-08-13: 10 mg via ORAL
  Filled 2013-08-13: qty 1

## 2013-08-13 MED ORDER — TECHNETIUM TC 99M SESTAMIBI GENERIC - CARDIOLITE
10.0000 | Freq: Once | INTRAVENOUS | Status: AC | PRN
  Administered 2013-08-13: 10 via INTRAVENOUS

## 2013-08-13 MED ORDER — ISOSORBIDE MONONITRATE ER 30 MG PO TB24
30.0000 mg | ORAL_TABLET | Freq: Every day | ORAL | Status: DC
  Administered 2013-08-13 – 2013-08-16 (×4): 30 mg via ORAL
  Filled 2013-08-13 (×4): qty 1

## 2013-08-13 NOTE — Progress Notes (Addendum)
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 in anticipation of possible cath  No Known Allergies  Patient Measurements: Height: 5\' 10"  (177.8 cm) Weight: 212 lb 3.2 oz (96.253 kg) IBW/kg (Calculated) : 73 HDW: ~93kg  Vital Signs: Temp: 98 F (36.7 C) (12/26 0541) Temp src: Oral (12/26 0541) BP: 132/56 mmHg (12/26 0541) Pulse Rate: 47 (12/26 0541)  Labs:  Recent Labs  08/10/13 1841 08/11/13 0155 08/11/13 0700 08/11/13 1240 08/12/13 0440 08/12/13 1430 08/13/13 0030 08/13/13 0038 08/13/13 0453  HGB 15.4 14.4  --   --   --   --   --   --   --   HCT 46.0 42.5  --   --   --   --   --   --   --   PLT 200 197  --   --   --   --   --   --   --   LABPROT 30.7* 29.4*  --   --  22.7*  --   --  19.4*  --   INR 3.08* 2.91*  --   --  2.08*  --   --  1.69*  --   HEPARINUNFRC  --   --   --   --   --  0.16* 0.51  --  0.50  CREATININE 0.90 0.96  --   --   --   --   --   --   --   TROPONINI <0.30 <0.30 <0.30 <0.30  --   --   --   --   --     Estimated Creatinine Clearance: 81 ml/min (by C-G formula based on Cr of 0.96).  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 HL is therapeutic x2 this am at 0.5. For myoview today. Continue to hold warfarin in case cath is needed.  Goal of Therapy:  Heparin level 0.3-0.7 units/ml Monitor platelets by anticoagulation protocol: Yes   Plan:  -Continue heparin at 1600 units/hr  -F/U HL with AM labs -Daily CBC/HL -Monitor for bleeding -F/U myoview results   Thank you for allowing me to take part in this patient's care,  Sheppard Coil PharmD., BCPS Clinical Pharmacist Pager [phone] 08/13/2013 8:37 AM  Myoview negative; no cath planned. Orders received to resume warfarin - INR 1.6 - will give 10mg  tonight Daily INR 08/13/2013 1:50 PM

## 2013-08-13 NOTE — Progress Notes (Signed)
Subjective: No complaints  Objective: Vital signs in last 24 hours: Temp:  [97.9 F (36.6 C)-98.6 F (37 C)] 98 F (36.7 C) (12/26 0541) Pulse Rate:  [47-74] 47 (12/26 0541) Resp:  [16] 16 (12/25 1333) BP: (132)/(56-66) 132/56 mmHg (12/26 0541) SpO2:  [92 %-95 %] 92 % (12/26 0541) Weight:  [212 lb 3.2 oz (96.253 kg)] 212 lb 3.2 oz (96.253 kg) (12/26 0541) Weight change: -2 lb 11.2 oz (-1.225 kg) Last BM Date: 08/11/13 Intake/Output from previous day: -2040 (-3880 since admit)  Wt 212.3 down from 222 on amit. 12/25 0701 - 12/26 0700 In: 960 [P.O.:960] Out: 3000 [Urine:3000] Intake/Output this shift:    PE: General:Pleasant affect, NAD Skin:Warm and dry, brisk capillary refill HEENT:normocephalic, sclera clear, mucus membranes moist Neck:supple, no JVD, no bruits  Heart:S1S2 RRR without murmur, gallup, rub or click Lungs:clear without rales, rhonchi, or wheezes UJW:JXBJ, non tender, + BS, do not palpate liver spleen or masses Ext:no lower ext edema, 2+ pedal pulses, 2+ radial pulses Neuro:alert and oriented, MAE, follows commands, + facial symmetry   Lab Results:  Recent Labs  08/10/13 1841 08/11/13 0155  WBC 6.9 7.9  HGB 15.4 14.4  HCT 46.0 42.5  PLT 200 197   BMET  Recent Labs  08/10/13 1841 08/11/13 0155  NA 141 141  K 4.0 3.7  CL 102 105  CO2 25 25  GLUCOSE 150* 154*  BUN 19 20  CREATININE 0.90 0.96  CALCIUM 10.3 9.6    Recent Labs  08/11/13 0700 08/11/13 1240  TROPONINI <0.30 <0.30    Lab Results  Component Value Date   LDLCALC 50 01/07/2013   TRIG 219* 01/07/2013   No results found for this basename: HGBA1C     Lab Results  Component Value Date   TSH 3.450 02/11/2013    Hepatic Function Panel  Recent Labs  08/11/13 0155  PROT 6.8  ALBUMIN 3.9  AST 17  ALT 13  ALKPHOS 32*  BILITOT 0.4   No results found for this basename: CHOL,  in the last 72 hours No results found for this basename: PROTIME,  in the last 72  hours     Studies/Results: No results found.  Medications: I have reviewed the patient's current medications. Scheduled Meds: . allopurinol  300 mg Oral QHS  . amLODipine  5 mg Oral q morning - 10a  . aspirin  81 mg Oral QHS  . atorvastatin  40 mg Oral q1800  . fenofibrate  160 mg Oral Daily  . furosemide  40 mg Intravenous Q12H  . insulin aspart  0-9 Units Subcutaneous TID WC  . metoprolol succinate  125 mg Oral q morning - 10a  . nitroGLYCERIN  1 inch Topical Q6H  . potassium chloride SA  20 mEq Oral Daily  . pregabalin  50 mg Oral QHS  . regadenoson      . silver sulfADIAZINE  1 application Topical Daily  . sodium chloride  3 mL Intravenous Q12H   Continuous Infusions: . heparin 1,600 Units/hr (08/12/13 2112)   PRN Meds:.sodium chloride, hydrALAZINE, morphine injection, oxyCODONE, oxyCODONE-acetaminophen, sodium chloride  Assessment/Plan: Principal Problem:   Acute on chronic combined systolic and diastolic congestive heart failure Active Problems:   Esophageal dysphagia   Long term (current) use of anticoagulants   H/O aortic valve replacement- St Jude   CAD - CABG '90 with re do '94 and susbequent PCIs- last Myoview 5/14 low risk   Tobacco abuse  PVD prior SFA PTA with chronic LE disease, not amenable to PTA   DM2 (diabetes mellitus, type 2)   Accelerated hypertension on admission   Hyperlipidemia LDL goal < 70   Chest pain on admission MI r/o   Cardiomyopathy, ischemic - EF 25-30% by echo 5/14   COPD (chronic obstructive pulmonary disease)   LBBB (left bundle branch block)  PLAN: neg troponin for nuc. Study today.  LOS: 3 days   Time spent with pt. :15 minutes. [facility] R  Nurse Practitioner Certified Pager [phone] or after 5pm and on weekends call [phone] 08/13/2013, 9:28 AM   Agree with note written by Nada Boozer RNP  INR therapeutic. For myoview this AM. If significantly worse then previous study cath on Monday, otherwise med  Rx.  Runell Gess 08/13/2013 9:34 AM

## 2013-08-13 NOTE — Progress Notes (Signed)
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 in anticipation of possible cath  No Known Allergies  Patient Measurements: Height: 5\' 10"  (177.8 cm) Weight: 214 lb 14.4 oz (97.478 kg) IBW/kg (Calculated) : 73 HDW: ~93kg  Vital Signs: Temp: 97.9 F (36.6 C) (12/25 2044) Temp src: Oral (12/25 2044) BP: 132/66 mmHg (12/25 2044) Pulse Rate: 58 (12/25 2044)  Labs:  Recent Labs  08/10/13 1841 08/11/13 0155 08/11/13 0700 08/11/13 1240 08/12/13 0440 08/12/13 1430 08/13/13 0030 08/13/13 0038  HGB 15.4 14.4  --   --   --   --   --   --   HCT 46.0 42.5  --   --   --   --   --   --   PLT 200 197  --   --   --   --   --   --   LABPROT 30.7* 29.4*  --   --  22.7*  --   --  19.4*  INR 3.08* 2.91*  --   --  2.08*  --   --  1.69*  HEPARINUNFRC  --   --   --   --   --  0.16* 0.51  --   CREATININE 0.90 0.96  --   --   --   --   --   --   TROPONINI <0.30 <0.30 <0.30 <0.30  --   --   --   --     Estimated Creatinine Clearance: 81.5 ml/min (by C-G formula based on Cr of 0.96).  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 HL is 0.51.   Goal of Therapy:  Heparin level 0.3-0.7 units/ml Monitor platelets by anticoagulation protocol: Yes   Plan:  -Continue heparin at 1600 units/hr  -F/U HL with AM labs -Daily CBC/HL -Monitor for bleeding -F/U cath plans  Thank you for allowing me to take part in this patient's care,  Abran Duke, PharmD Clinical Pharmacist Phone: [phone] Pager: [phone] 08/13/2013 1:07 AM

## 2013-08-13 NOTE — Progress Notes (Signed)
Lexiscan myoview completed without complications.  + triplett PVCs, paired PVCs as well.  On IV heparin.  Nuc med results to follow.  Also decreased BB due to HR 48 freq.     Discussed results of nuc study with Dr. Tresa Endo.  Negative for ischemia, EF remains low at 30 %.  We added Imdur 30 mg daily and are resuming coumadin per pharmacy.  No plan for cath.  Also discussed possibility of ICD with Dr. Tresa Endo.

## 2013-08-13 NOTE — Progress Notes (Signed)
[facility] PROGRESS NOTE  Tanner Pena XBJ:[id number] DOB: November 20, 1940 DOA: 08/10/2013 PCP: Cassell Smiles., MD  Assessment/Plan: Principal Problem:   Acute on chronic combined systolic and diastolic congestive heart failure: In the context of patient with history of aortic valve replacement St. Jude's valve, CAD status post CABG in 1990 with redo in 1994 and subsequent PCI s - Patient currently on IV Lasix - Cardiology on board managing currently adjusting medications - Follow up with Daily weights, strict I's and O's  Active Problems:   DM2 (diabetes mellitus, type 2) - Continue diabetic diet -Patient on sliding scale insulin    Accelerated hypertension on admission -  Currently on amlodipine, imdur, metoprolol    Hyperlipidemia LDL goal < 70 - Will continue statin    Chest pain on admission MI r/o - Cardiac enzymes x3 negative - Cardiology on board    Cardiomyopathy, ischemic - EF 25-30% by echo 5/14 -Cardiology managing - Possible Cardiac Cath pending further results from work up as indicated above    COPD (chronic obstructive pulmonary disease) - Currently compensated    LBBB (left bundle branch block)  Code Status: Full Family Communication: Discussed with daughter at bedside Disposition Plan: Pending further recommendations from cardiologist on board   Consultants:  Cardiology: Dr. Tresa Endo  Procedures:  None  Antibiotics:  None  HPI/Subjective: Pt denies any chest pain.  No new issues reported overnight.  Objective: Filed Vitals:   08/13/13 1400  BP: 134/56  Pulse: 48  Temp: 97.6 F (36.4 C)  Resp: 18    Intake/Output Summary (Last 24 hours) at 08/13/13 1555 Last data filed at 08/12/13 2036  Gross per 24 hour  Intake    360 ml  Output   1200 ml  Net   -840 ml   Filed Weights   08/11/13 0555 08/12/13 0543 08/13/13 0541  Weight: 100.699 kg (222 lb) 97.478 kg (214 lb 14.4 oz) 96.253 kg (212 lb 3.2 oz)    Exam:   General:   Pt has NAD, Alert and Awake  Cardiovascular: S1 and S2 WNL, no rubs  Respiratory: CTA BL, prolonged expiratory phase  Abdomen: soft, NT, ND  Musculoskeletal: no cyanosis or clubbing   Data Reviewed: Basic Metabolic Panel:  Recent Labs Lab 08/10/13 1841 08/11/13 0155  NA 141 141  K 4.0 3.7  CL 102 105  CO2 25 25  GLUCOSE 150* 154*  BUN 19 20  CREATININE 0.90 0.96  CALCIUM 10.3 9.6   Liver Function Tests:  Recent Labs Lab 08/10/13 1841 08/11/13 0155  AST 22 17  ALT 16 13  ALKPHOS 37* 32*  BILITOT 0.5 0.4  PROT 7.7 6.8  ALBUMIN 4.3 3.9   No results found for this basename: LIPASE, AMYLASE,  in the last 168 hours No results found for this basename: AMMONIA,  in the last 168 hours CBC:  Recent Labs Lab 08/10/13 1841 08/11/13 0155  WBC 6.9 7.9  HGB 15.4 14.4  HCT 46.0 42.5  MCV 90.4 90.2  PLT 200 197   Cardiac Enzymes:  Recent Labs Lab 08/10/13 1841 08/11/13 0155 08/11/13 0700 08/11/13 1240  TROPONINI <0.30 <0.30 <0.30 <0.30   BNP (last 3 results)  Recent Labs  08/10/13 1841  PROBNP 784.4*   CBG:  Recent Labs Lab 08/12/13 1144 08/12/13 1648 08/12/13 2042 08/13/13 0722 08/13/13 1159  GLUCAP 236* 152* 170* 155* 205*    No results found for this or any previous visit (from the past 240 hour(s)).   Studies: Nm  Myocar Multi W/spect W/wall Motion / Ef  08/13/2013   CLINICAL DATA:  Chest pain  EXAM: MYOCARDIAL IMAGING WITH SPECT (REST AND PHARMACOLOGIC-STRESS)  GATED LEFT VENTRICULAR WALL MOTION STUDY  LEFT VENTRICULAR EJECTION FRACTION  TECHNIQUE: Standard myocardial SPECT imaging was performed after resting intravenous injection of 10 mCi Tc-62m sestamibi. Subsequently, intravenous infusion of Lexiscan was performed under the supervision of the Cardiology staff. At peak effect of the drug, 30 mCi Tc-91m sestamibi was injected intravenously and standard myocardial SPECT imaging was performed. Quantitative gated imaging was also performed to  evaluate left ventricular wall motion, and estimate left ventricular ejection fraction.  COMPARISON:  None.  FINDINGS: SPECT: No stress-induced perfusion defect. There is a large fixed defect involving the inferior wall extending into the apex and anteroseptal regions.  Wall motion:  Severe global hypokinesis.  Ejection fraction: 30%. End-diastolic volume 250 cc. End systolic volume 176 cc.  IMPRESSION: No stress-induced ischemia. Fixed defect as described involving the inferior wall extending into the apex and anteroseptal region.   Electronically Signed   By: Maryclare Bean M.D.   On: 08/13/2013 11:43    Scheduled Meds: . allopurinol  300 mg Oral QHS  . amLODipine  5 mg Oral q morning - 10a  . aspirin  81 mg Oral QHS  . atorvastatin  40 mg Oral q1800  . fenofibrate  160 mg Oral Daily  . furosemide  40 mg Intravenous Q12H  . insulin aspart  0-9 Units Subcutaneous TID WC  . isosorbide mononitrate  30 mg Oral Daily  . metoprolol succinate  75 mg Oral Daily  . potassium chloride SA  20 mEq Oral Daily  . pregabalin  50 mg Oral QHS  . silver sulfADIAZINE  1 application Topical Daily  . sodium chloride  3 mL Intravenous Q12H  . warfarin  10 mg Oral ONCE-1800  . Warfarin - Pharmacist Dosing Inpatient   Does not apply q1800   Continuous Infusions: . heparin 1,600 Units/hr (08/13/13 1438)    Principal Problem:   Acute on chronic combined systolic and diastolic congestive heart failure Active Problems:   Esophageal dysphagia   Long term (current) use of anticoagulants   H/O aortic valve replacement- St Jude   CAD - CABG '90 with re do '94 and susbequent PCIs- last Myoview 5/14 low risk   Tobacco abuse   PVD prior SFA PTA with chronic LE disease, not amenable to PTA   DM2 (diabetes mellitus, type 2)   Accelerated hypertension on admission   Hyperlipidemia LDL goal < 70   Chest pain on admission MI r/o   Cardiomyopathy, ischemic - EF 25-30% by echo 5/14   COPD (chronic obstructive pulmonary  disease)   LBBB (left bundle branch block)    Time spent: > 35 minutes    [name]  [facility] Pager [phone]. If 7PM-7AM, please contact night-coverage at [email or web address], password [facility] 08/13/2013, 3:55 PM  LOS: 3 days

## 2013-08-14 LAB — GLUCOSE, CAPILLARY
Glucose-Capillary: 150 mg/dL — ABNORMAL HIGH (ref 70–99)
Glucose-Capillary: 180 mg/dL — ABNORMAL HIGH (ref 70–99)

## 2013-08-14 LAB — HEPARIN LEVEL (UNFRACTIONATED): Heparin Unfractionated: 0.59 IU/mL (ref 0.30–0.70)

## 2013-08-14 LAB — PROTIME-INR
INR: 1.43 (ref 0.00–1.49)
Prothrombin Time: 17.1 seconds — ABNORMAL HIGH (ref 11.6–15.2)

## 2013-08-14 MED ORDER — WARFARIN SODIUM 10 MG PO TABS
10.0000 mg | ORAL_TABLET | Freq: Once | ORAL | Status: AC
  Administered 2013-08-14: 10 mg via ORAL
  Filled 2013-08-14: qty 1

## 2013-08-14 MED ORDER — WARFARIN SODIUM 5 MG PO TABS
5.0000 mg | ORAL_TABLET | Freq: Once | ORAL | Status: DC
Start: 2013-08-14 — End: 2013-08-14

## 2013-08-14 NOTE — Progress Notes (Signed)
[facility] PROGRESS NOTE  Tanner Pena NWG:[id number] DOB: 1941/06/24 DOA: 08/10/2013 PCP: Cassell Smiles., MD  Assessment/Plan: Principal Problem:   Acute on chronic combined systolic and diastolic congestive heart failure: In the context of patient with history of aortic valve replacement St. Jude's valve, CAD status post CABG in 1990 with redo in 1994 and subsequent PCI s - Cardiology currently managing. Plan is to continue IV heparin until INR therapeutic. Coumadin was on hold due to possibility of cath but at this juncture no plans for cath.  Active Problems:   DM2 (diabetes mellitus, type 2) - Continue diabetic diet -Patient on sliding scale insulin - Blood sugars relatively well controlled    Accelerated hypertension on admission -  Currently on amlodipine, imdur, metoprolol    Hyperlipidemia LDL goal < 70 - Will continue statin    Chest pain on admission MI r/o - Cardiac enzymes x3 negative - Cardiology on board    Cardiomyopathy, ischemic - EF 25-30% by echo 5/14 -Cardiology managing - Possible Cardiac Cath pending further results from work up as indicated above    COPD (chronic obstructive pulmonary disease) - Currently compensated    LBBB (left bundle branch block)  Code Status: Full Family Communication: Discussed with daughter at bedside Disposition Plan: Pending further recommendations from cardiologist on board   Consultants:  Cardiology: Dr. Allyson Sabal  Procedures:  None  Antibiotics:  None  HPI/Subjective: No new complaints reported to me by patient.  Objective: Filed Vitals:   08/14/13 0500  BP: 116/57  Pulse: 42  Temp: 97.9 F (36.6 C)  Resp:     Intake/Output Summary (Last 24 hours) at 08/14/13 1036 Last data filed at 08/14/13 0900  Gross per 24 hour  Intake    736 ml  Output   2025 ml  Net  -1289 ml   Filed Weights   08/12/13 0543 08/13/13 0541 08/14/13 0500  Weight: 97.478 kg (214 lb 14.4 oz) 96.253 kg (212 lb  3.2 oz) 96.163 kg (212 lb)    Exam:   General:  Pt has NAD, Alert and Awake  Cardiovascular: S1 and S2 WNL, no rubs  Respiratory: CTA BL, prolonged expiratory phase  Abdomen: soft, NT, ND  Musculoskeletal: no cyanosis or clubbing   Data Reviewed: Basic Metabolic Panel:  Recent Labs Lab 08/10/13 1841 08/11/13 0155  NA 141 141  K 4.0 3.7  CL 102 105  CO2 25 25  GLUCOSE 150* 154*  BUN 19 20  CREATININE 0.90 0.96  CALCIUM 10.3 9.6   Liver Function Tests:  Recent Labs Lab 08/10/13 1841 08/11/13 0155  AST 22 17  ALT 16 13  ALKPHOS 37* 32*  BILITOT 0.5 0.4  PROT 7.7 6.8  ALBUMIN 4.3 3.9   No results found for this basename: LIPASE, AMYLASE,  in the last 168 hours No results found for this basename: AMMONIA,  in the last 168 hours CBC:  Recent Labs Lab 08/10/13 1841 08/11/13 0155  WBC 6.9 7.9  HGB 15.4 14.4  HCT 46.0 42.5  MCV 90.4 90.2  PLT 200 197   Cardiac Enzymes:  Recent Labs Lab 08/10/13 1841 08/11/13 0155 08/11/13 0700 08/11/13 1240  TROPONINI <0.30 <0.30 <0.30 <0.30   BNP (last 3 results)  Recent Labs  08/10/13 1841  PROBNP 784.4*   CBG:  Recent Labs Lab 08/13/13 0722 08/13/13 1159 08/13/13 1628 08/13/13 2029 08/14/13 0749  GLUCAP 155* 205* 158* 251* 150*    No results found for this or any previous visit (from  the past 240 hour(s)).   Studies: Nm Myocar Multi W/spect W/wall Motion / Ef  08/13/2013   CLINICAL DATA:  Chest pain  EXAM: MYOCARDIAL IMAGING WITH SPECT (REST AND PHARMACOLOGIC-STRESS)  GATED LEFT VENTRICULAR WALL MOTION STUDY  LEFT VENTRICULAR EJECTION FRACTION  TECHNIQUE: Standard myocardial SPECT imaging was performed after resting intravenous injection of 10 mCi Tc-59m sestamibi. Subsequently, intravenous infusion of Lexiscan was performed under the supervision of the Cardiology staff. At peak effect of the drug, 30 mCi Tc-37m sestamibi was injected intravenously and standard myocardial SPECT imaging was  performed. Quantitative gated imaging was also performed to evaluate left ventricular wall motion, and estimate left ventricular ejection fraction.  COMPARISON:  None.  FINDINGS: SPECT: No stress-induced perfusion defect. There is a large fixed defect involving the inferior wall extending into the apex and anteroseptal regions.  Wall motion:  Severe global hypokinesis.  Ejection fraction: 30%. End-diastolic volume 250 cc. End systolic volume 176 cc.  IMPRESSION: No stress-induced ischemia. Fixed defect as described involving the inferior wall extending into the apex and anteroseptal region.   Electronically Signed   By: Maryclare Bean M.D.   On: 08/13/2013 11:43    Scheduled Meds: . allopurinol  300 mg Oral QHS  . amLODipine  5 mg Oral q morning - 10a  . aspirin  81 mg Oral QHS  . atorvastatin  40 mg Oral q1800  . fenofibrate  160 mg Oral Daily  . furosemide  40 mg Intravenous Q12H  . insulin aspart  0-9 Units Subcutaneous TID WC  . isosorbide mononitrate  30 mg Oral Daily  . metoprolol succinate  75 mg Oral Daily  . potassium chloride SA  20 mEq Oral Daily  . pregabalin  50 mg Oral QHS  . silver sulfADIAZINE  1 application Topical Daily  . sodium chloride  3 mL Intravenous Q12H  . warfarin  10 mg Oral ONCE-1800  . Warfarin - Pharmacist Dosing Inpatient   Does not apply q1800   Continuous Infusions: . heparin 1,600 Units/hr (08/14/13 0802)    Principal Problem:   Acute on chronic combined systolic and diastolic congestive heart failure Active Problems:   Esophageal dysphagia   Long term (current) use of anticoagulants   H/O aortic valve replacement- St Jude   CAD - CABG '90 with re do '94 and susbequent PCIs- last Myoview 5/14 low risk   Tobacco abuse   PVD prior SFA PTA with chronic LE disease, not amenable to PTA   DM2 (diabetes mellitus, type 2)   Accelerated hypertension on admission   Hyperlipidemia LDL goal < 70   Chest pain on admission MI r/o   Cardiomyopathy, ischemic - EF  25-30% by echo 5/14   COPD (chronic obstructive pulmonary disease)   LBBB (left bundle branch block)    Time spent: > 35 minutes    [name]  [facility] Pager [phone]. If 7PM-7AM, please contact night-coverage at [email or web address], password [facility] 08/14/2013, 10:36 AM  LOS: 4 days

## 2013-08-14 NOTE — Progress Notes (Signed)
Subjective:  No CP/SOB  Objective:  Temp:  [97.3 F (36.3 C)-97.9 F (36.6 C)] 97.9 F (36.6 C) (12/27 0500) Pulse Rate:  [42-63] 42 (12/27 0500) Resp:  [16-18] 16 (12/26 2100) BP: (115-150)/(53-73) 116/57 mmHg (12/27 0500) SpO2:  [96 %-97 %] 96 % (12/27 0500) Weight:  [212 lb (96.163 kg)] 212 lb (96.163 kg) (12/27 0500) Weight change: -3.2 oz (-0.091 kg)  Intake/Output from previous day: 12/26 0701 - 12/27 0700 In: 736 [P.O.:120; I.V.:616] Out: 1300 [Urine:1300]  Intake/Output from this shift:    Physical Exam: General appearance: alert and no distress Neck: no adenopathy, no carotid bruit, no JVD, supple, symmetrical, trachea midline and thyroid not enlarged, symmetric, no tenderness/mass/nodules Lungs: clear to auscultation bilaterally Heart: Crisp prosthetic heart sounds Extremities: extremities normal, atraumatic, no cyanosis or edema  Lab Results: Results for orders placed during the hospital encounter of 08/10/13 (from the past 48 hour(s))  GLUCOSE, CAPILLARY     Status: Abnormal   Collection Time    08/12/13 11:44 AM      Result Value Range   Glucose-Capillary 236 (*) 70 - 99 mg/dL  HEPARIN LEVEL (UNFRACTIONATED)     Status: Abnormal   Collection Time    08/12/13  2:30 PM      Result Value Range   Heparin Unfractionated 0.16 (*) 0.30 - 0.70 IU/mL   Comment:            IF HEPARIN RESULTS ARE BELOW     EXPECTED VALUES, AND PATIENT     DOSAGE HAS BEEN CONFIRMED,     SUGGEST FOLLOW UP TESTING     OF ANTITHROMBIN III LEVELS.  GLUCOSE, CAPILLARY     Status: Abnormal   Collection Time    08/12/13  4:48 PM      Result Value Range   Glucose-Capillary 152 (*) 70 - 99 mg/dL  GLUCOSE, CAPILLARY     Status: Abnormal   Collection Time    08/12/13  8:42 PM      Result Value Range   Glucose-Capillary 170 (*) 70 - 99 mg/dL  HEPARIN LEVEL (UNFRACTIONATED)     Status: None   Collection Time    08/13/13 12:30 AM      Result Value Range   Heparin  Unfractionated 0.51  0.30 - 0.70 IU/mL   Comment:            IF HEPARIN RESULTS ARE BELOW     EXPECTED VALUES, AND PATIENT     DOSAGE HAS BEEN CONFIRMED,     SUGGEST FOLLOW UP TESTING     OF ANTITHROMBIN III LEVELS.  PROTIME-INR     Status: Abnormal   Collection Time    08/13/13 12:38 AM      Result Value Range   Prothrombin Time 19.4 (*) 11.6 - 15.2 seconds   INR 1.69 (*) 0.00 - 1.49  HEPARIN LEVEL (UNFRACTIONATED)     Status: None   Collection Time    08/13/13  4:53 AM      Result Value Range   Heparin Unfractionated 0.50  0.30 - 0.70 IU/mL   Comment:            IF HEPARIN RESULTS ARE BELOW     EXPECTED VALUES, AND PATIENT     DOSAGE HAS BEEN CONFIRMED,     SUGGEST FOLLOW UP TESTING     OF ANTITHROMBIN III LEVELS.  GLUCOSE, CAPILLARY     Status: Abnormal   Collection Time    08/13/13  7:22 AM      Result Value Range   Glucose-Capillary 155 (*) 70 - 99 mg/dL   Comment 1 Documented in Chart     Comment 2 Notify RN    GLUCOSE, CAPILLARY     Status: Abnormal   Collection Time    08/13/13 11:59 AM      Result Value Range   Glucose-Capillary 205 (*) 70 - 99 mg/dL   Comment 1 Documented in Chart     Comment 2 Notify RN    GLUCOSE, CAPILLARY     Status: Abnormal   Collection Time    08/13/13  4:28 PM      Result Value Range   Glucose-Capillary 158 (*) 70 - 99 mg/dL   Comment 1 Documented in Chart     Comment 2 Notify RN    GLUCOSE, CAPILLARY     Status: Abnormal   Collection Time    08/13/13  8:29 PM      Result Value Range   Glucose-Capillary 251 (*) 70 - 99 mg/dL  PROTIME-INR     Status: Abnormal   Collection Time    08/14/13  5:00 AM      Result Value Range   Prothrombin Time 17.1 (*) 11.6 - 15.2 seconds   INR 1.43  0.00 - 1.49  HEPARIN LEVEL (UNFRACTIONATED)     Status: None   Collection Time    08/14/13  5:00 AM      Result Value Range   Heparin Unfractionated 0.59  0.30 - 0.70 IU/mL   Comment:            IF HEPARIN RESULTS ARE BELOW     EXPECTED VALUES,  AND PATIENT     DOSAGE HAS BEEN CONFIRMED,     SUGGEST FOLLOW UP TESTING     OF ANTITHROMBIN III LEVELS.  GLUCOSE, CAPILLARY     Status: Abnormal   Collection Time    08/14/13  7:49 AM      Result Value Range   Glucose-Capillary 150 (*) 70 - 99 mg/dL   Comment 1 Notify RN      Imaging: Imaging results have been reviewed  Assessment/Plan:   1. Principal Problem: 2.   Acute on chronic combined systolic and diastolic congestive heart failure 3. Active Problems: 4.   Esophageal dysphagia 5.   Long term (current) use of anticoagulants 6.   H/O aortic valve replacement- St Jude 7.   CAD - CABG '90 with re do '94 and susbequent PCIs- last Myoview 5/14 low risk 8.   Tobacco abuse 9.   PVD prior SFA PTA with chronic LE disease, not amenable to PTA 10.   DM2 (diabetes mellitus, type 2) 11.   Accelerated hypertension on admission 12.   Hyperlipidemia LDL goal < 70 13.   Chest pain on admission MI r/o 14.   Cardiomyopathy, ischemic - EF 25-30% by echo 5/14 15.   COPD (chronic obstructive pulmonary disease) 16.   LBBB (left bundle branch block) 17.   Time Spent Directly with Patient:  20 minutes  Length of Stay:  LOS: 4 days   Myoview low risk with inferior scar w/o ischemia. No further CP. Coumadin on hold in anticipation of cath. INR 1.43 on IV hep. Exam benign. At this point there is no indication for cath. Plan to restart Coumadin. Continue IV hep until INR therapeutic.   Tanner Pena 08/14/2013, 8:56 AM

## 2013-08-14 NOTE — Progress Notes (Signed)
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 in anticipation of possible cath  No Known Allergies  Patient Measurements: Height: 5\' 10"  (177.8 cm) Weight: 212 lb (96.163 kg) IBW/kg (Calculated) : 73 HDW: ~93kg  Vital Signs: Temp: 97.9 F (36.6 C) (12/27 0500) BP: 116/57 mmHg (12/27 0500) Pulse Rate: 42 (12/27 0500)  Labs:  Recent Labs  08/11/13 1240 08/12/13 0440  08/13/13 0030 08/13/13 0038 08/13/13 0453 08/14/13 0500  LABPROT  --  22.7*  --   --  19.4*  --  17.1*  INR  --  2.08*  --   --  1.69*  --  1.43  HEPARINUNFRC  --   --   < > 0.51  --  0.50 0.59  TROPONINI <0.30  --   --   --   --   --   --   < > = values in this interval not displayed.  Estimated Creatinine Clearance: 81 ml/min (by C-G formula based on Cr of 0.96).  Assessment: 72 y/o with CP, warfarin PTA for mechanical AVR, on heparin in the meantime. S/p myoveiw negative no plans for cath, restarted coumadin on 12/26 at baseline INR of 1.69. HL is therapeutic today. Last CBC and renal function stable.  Goal of Therapy:  Heparin level 0.3-0.7 units/ml Monitor platelets by anticoagulation protocol: Yes   Plan:  -Warfarin 10mg  x1 tonight -Continue heparin at 1600 units/hr  -Follow up on daily HL, and PT/INR, recheck CBC tomorrow -Monitor for bleeding  Thank you for allowing me to take part in this patient's care,  Casmir Auguste M. Allena Katz, PharmD Clinical Pharmacist- Resident 08/14/2013 10:34 AM

## 2013-08-14 NOTE — Progress Notes (Signed)
HR=[id number]. [place] notified.  Will hold daily 75 mg Metoprolol for now.  No complains of CP/discomfort/SOB.

## 2013-08-15 LAB — GLUCOSE, CAPILLARY
Glucose-Capillary: 199 mg/dL — ABNORMAL HIGH (ref 70–99)
Glucose-Capillary: 263 mg/dL — ABNORMAL HIGH (ref 70–99)
Glucose-Capillary: 197 mg/dL — ABNORMAL HIGH (ref 70–99)

## 2013-08-15 LAB — CBC
Hemoglobin: 14.5 g/dL (ref 13.0–17.0)
MCH: 30.1 pg (ref 26.0–34.0)
MCHC: 33.1 g/dL (ref 30.0–36.0)
MCV: 90.9 fL (ref 78.0–100.0)
RBC: 4.82 MIL/uL (ref 4.22–5.81)

## 2013-08-15 LAB — PROTIME-INR: Prothrombin Time: 20.2 seconds — ABNORMAL HIGH (ref 11.6–15.2)

## 2013-08-15 MED ORDER — RAMIPRIL 2.5 MG PO CAPS
2.5000 mg | ORAL_CAPSULE | Freq: Every day | ORAL | Status: DC
  Administered 2013-08-15: 2.5 mg via ORAL
  Filled 2013-08-15 (×2): qty 1

## 2013-08-15 MED ORDER — RAMIPRIL 5 MG PO CAPS: 5.0000 mg | ORAL_CAPSULE | Freq: Every day | ORAL | Status: DC

## 2013-08-15 MED ORDER — WARFARIN SODIUM 10 MG PO TABS
10.0000 mg | ORAL_TABLET | Freq: Once | ORAL | Status: AC
  Administered 2013-08-15: 10 mg via ORAL
  Filled 2013-08-15: qty 1

## 2013-08-15 NOTE — Progress Notes (Signed)
[facility] PROGRESS NOTE  Tanner Pena EAV:[id number] DOB: Jan 06, 1941 DOA: 08/10/2013 PCP: Cassell Smiles., MD  Assessment/Plan: Principal Problem:   Acute on chronic combined systolic and diastolic congestive heart failure: In the context of patient with history of aortic valve replacement St. Jude's valve, CAD status post CABG in 1990 with redo in 1994 and subsequent PCI s - Cardiology currently managing, per their recommendations: On IV hep to coumadin A/C for AVR. INR increasing (1.78). D/C home when > 2.2   Awaiting for INR to be therapeutic at recommended level as listed above.  Active Problems:   DM2 (diabetes mellitus, type 2) - Continue diabetic diet -Patient on sliding scale insulin - Blood sugars relatively well controlled and have ranged from 150-199    Accelerated hypertension on admission -  Currently on amlodipine, imdur, metoprolol    Hyperlipidemia LDL goal < 70 - Will continue statin    Chest pain on admission MI r/o - Cardiac enzymes x3 negative - Cardiology on board - no need for cardiac cath at this juncture per cardiology    Cardiomyopathy, ischemic - EF 25-30% by echo 5/14 -Cardiology managing, continue medical management at this time    COPD (chronic obstructive pulmonary disease) - Currently compensated  Code Status: Full Family Communication: Discussed with daughter at bedside Disposition Plan: Pending further recommendations from cardiologist on board   Consultants:  Cardiology: Dr. Allyson Sabal  Procedures:  None  Antibiotics:  None  HPI/Subjective: No new complaints reported to me by patient.  Objective: Filed Vitals:   08/15/13 1334  BP: 127/51  Pulse: 54  Temp: 98.3 F (36.8 C)  Resp: 16    Intake/Output Summary (Last 24 hours) at 08/15/13 1500 Last data filed at 08/15/13 1333  Gross per 24 hour  Intake   1688 ml  Output   2600 ml  Net   -912 ml   Filed Weights   08/13/13 0541 08/14/13 0500 08/15/13 0500   Weight: 96.253 kg (212 lb 3.2 oz) 96.163 kg (212 lb) 95.845 kg (211 lb 4.8 oz)    Exam:   General:  Pt has NAD, Alert and Awake  Cardiovascular: S1 and S2 WNL, no rubs  Respiratory: CTA BL, prolonged expiratory phase  Abdomen: soft, NT, ND  Musculoskeletal: no cyanosis or clubbing   Data Reviewed: Basic Metabolic Panel:  Recent Labs Lab 08/10/13 1841 08/11/13 0155  NA 141 141  K 4.0 3.7  CL 102 105  CO2 25 25  GLUCOSE 150* 154*  BUN 19 20  CREATININE 0.90 0.96  CALCIUM 10.3 9.6   Liver Function Tests:  Recent Labs Lab 08/10/13 1841 08/11/13 0155  AST 22 17  ALT 16 13  ALKPHOS 37* 32*  BILITOT 0.5 0.4  PROT 7.7 6.8  ALBUMIN 4.3 3.9   No results found for this basename: LIPASE, AMYLASE,  in the last 168 hours No results found for this basename: AMMONIA,  in the last 168 hours CBC:  Recent Labs Lab 08/10/13 1841 08/11/13 0155 08/15/13 0510  WBC 6.9 7.9 7.1  HGB 15.4 14.4 14.5  HCT 46.0 42.5 43.8  MCV 90.4 90.2 90.9  PLT 200 197 203   Cardiac Enzymes:  Recent Labs Lab 08/10/13 1841 08/11/13 0155 08/11/13 0700 08/11/13 1240  TROPONINI <0.30 <0.30 <0.30 <0.30   BNP (last 3 results)  Recent Labs  08/10/13 1841  PROBNP 784.4*   CBG:  Recent Labs Lab 08/14/13 1140 08/14/13 1643 08/14/13 2031 08/15/13 0747 08/15/13 1118  GLUCAP 180* 180* 154*  188* 199*    No results found for this or any previous visit (from the past 240 hour(s)).   Studies: No results found.  Scheduled Meds: . allopurinol  300 mg Oral QHS  . amLODipine  5 mg Oral q morning - 10a  . aspirin  81 mg Oral QHS  . atorvastatin  40 mg Oral q1800  . fenofibrate  160 mg Oral Daily  . furosemide  40 mg Intravenous Q12H  . insulin aspart  0-9 Units Subcutaneous TID WC  . isosorbide mononitrate  30 mg Oral Daily  . metoprolol succinate  75 mg Oral Daily  . potassium chloride SA  20 mEq Oral Daily  . pregabalin  50 mg Oral QHS  . ramipril  2.5 mg Oral Daily  .  silver sulfADIAZINE  1 application Topical Daily  . sodium chloride  3 mL Intravenous Q12H  . warfarin  10 mg Oral ONCE-1800  . Warfarin - Pharmacist Dosing Inpatient   Does not apply q1800   Continuous Infusions: . heparin 1,600 Units/hr (08/14/13 0802)    Principal Problem:   Acute on chronic combined systolic and diastolic congestive heart failure Active Problems:   Esophageal dysphagia   Long term (current) use of anticoagulants   H/O aortic valve replacement- St Jude   CAD - CABG '90 with re do '94 and susbequent PCIs- last Myoview 5/14 low risk   Tobacco abuse   PVD prior SFA PTA with chronic LE disease, not amenable to PTA   DM2 (diabetes mellitus, type 2)   Accelerated hypertension on admission   Hyperlipidemia LDL goal < 70   Chest pain on admission MI r/o   Cardiomyopathy, ischemic - EF 25-30% by echo 5/14   COPD (chronic obstructive pulmonary disease)   LBBB (left bundle branch block)    Time spent: > 35 minutes    [name]  [facility] Pager [phone]. If 7PM-7AM, please contact night-coverage at [email or web address], password [facility] 08/15/2013, 3:00 PM  LOS: 5 days

## 2013-08-15 NOTE — Progress Notes (Signed)
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 in anticipation of possible cath  No Known Allergies  Patient Measurements: Height: 5\' 10"  (177.8 cm) Weight: 211 lb 4.8 oz (95.845 kg) IBW/kg (Calculated) : 73 HDW: ~93kg  Vital Signs: Temp: 97.7 F (36.5 C) (12/28 0500) BP: 144/49 mmHg (12/28 0500) Pulse Rate: 52 (12/28 0500)  Labs:  Recent Labs  08/13/13 0038 08/13/13 0453 08/14/13 0500 08/15/13 0510  HGB  --   --   --  14.5  HCT  --   --   --  43.8  PLT  --   --   --  203  LABPROT 19.4*  --  17.1* 20.2*  INR 1.69*  --  1.43 1.78*  HEPARINUNFRC  --  0.50 0.59 0.54    Estimated Creatinine Clearance: 80.8 ml/min (by C-G formula based on Cr of 0.96).  Assessment: 72 y/o with CP, warfarin PTA for mechanical AVR, on heparin in the meantime. S/p myoveiw negative no plans for cath, restarted coumadin on 12/26 at baseline INR of 1.69. HL is therapeutic today. Last CBC and renal function stable. INR remains sub therapeutic but trending up. Plan is to continue IV heparin until INR is >2.2.   Goal of Therapy:  Heparin level 0.3-0.7 units/ml Monitor platelets by anticoagulation protocol: Yes   Plan:  -Warfarin 10mg  x1 tonight -Continue heparin at 1600 units/hr  -Follow up on daily HL, and PT/INR, recheck CBC tomorrow -Monitor for bleeding  Thank you for allowing me to take part in this patient's care,  Samin Milke M. Allena Katz, PharmD Clinical Pharmacist- Resident 08/15/2013 8:50 AM

## 2013-08-15 NOTE — Progress Notes (Signed)
Subjective:  No CP/SOB  Objective:  Temp:  [97.7 F (36.5 C)-98.1 F (36.7 C)] 97.7 F (36.5 C) (12/28 0500) Pulse Rate:  [47-55] 52 (12/28 0500) Resp:  [16-18] 18 (12/28 0500) BP: (115-144)/(49-62) 144/49 mmHg (12/28 0500) SpO2:  [95 %-96 %] 96 % (12/28 0500) Weight:  [211 lb 4.8 oz (95.845 kg)] 211 lb 4.8 oz (95.845 kg) (12/28 0500) Weight change: -11.2 oz (-0.318 kg)  Intake/Output from previous day: 12/27 0701 - 12/28 0700 In: 1568 [P.O.:960; I.V.:608] Out: 2425 [Urine:2425]  Intake/Output from this shift:    Physical Exam: General appearance: alert and no distress Neck: no adenopathy, no carotid bruit, no JVD, supple, symmetrical, trachea midline and thyroid not enlarged, symmetric, no tenderness/mass/nodules Lungs: clear to auscultation bilaterally Heart: crisp VS Extremities: extremities normal, atraumatic, no cyanosis or edema  Lab Results: Results for orders placed during the hospital encounter of 08/10/13 (from the past 48 hour(s))  GLUCOSE, CAPILLARY     Status: Abnormal   Collection Time    08/13/13 11:59 AM      Result Value Range   Glucose-Capillary 205 (*) 70 - 99 mg/dL   Comment 1 Documented in Chart     Comment 2 Notify RN    GLUCOSE, CAPILLARY     Status: Abnormal   Collection Time    08/13/13  4:28 PM      Result Value Range   Glucose-Capillary 158 (*) 70 - 99 mg/dL   Comment 1 Documented in Chart     Comment 2 Notify RN    GLUCOSE, CAPILLARY     Status: Abnormal   Collection Time    08/13/13  8:29 PM      Result Value Range   Glucose-Capillary 251 (*) 70 - 99 mg/dL  PROTIME-INR     Status: Abnormal   Collection Time    08/14/13  5:00 AM      Result Value Range   Prothrombin Time 17.1 (*) 11.6 - 15.2 seconds   INR 1.43  0.00 - 1.49  HEPARIN LEVEL (UNFRACTIONATED)     Status: None   Collection Time    08/14/13  5:00 AM      Result Value Range   Heparin Unfractionated 0.59  0.30 - 0.70 IU/mL   Comment:            IF HEPARIN  RESULTS ARE BELOW     EXPECTED VALUES, AND PATIENT     DOSAGE HAS BEEN CONFIRMED,     SUGGEST FOLLOW UP TESTING     OF ANTITHROMBIN III LEVELS.  GLUCOSE, CAPILLARY     Status: Abnormal   Collection Time    08/14/13  7:49 AM      Result Value Range   Glucose-Capillary 150 (*) 70 - 99 mg/dL   Comment 1 Notify RN    GLUCOSE, CAPILLARY     Status: Abnormal   Collection Time    08/14/13 11:40 AM      Result Value Range   Glucose-Capillary 180 (*) 70 - 99 mg/dL  GLUCOSE, CAPILLARY     Status: Abnormal   Collection Time    08/14/13  4:43 PM      Result Value Range   Glucose-Capillary 180 (*) 70 - 99 mg/dL  GLUCOSE, CAPILLARY     Status: Abnormal   Collection Time    08/14/13  8:31 PM      Result Value Range   Glucose-Capillary 154 (*) 70 - 99 mg/dL  PROTIME-INR  Status: Abnormal   Collection Time    08/15/13  5:10 AM      Result Value Range   Prothrombin Time 20.2 (*) 11.6 - 15.2 seconds   INR 1.78 (*) 0.00 - 1.49  HEPARIN LEVEL (UNFRACTIONATED)     Status: None   Collection Time    08/15/13  5:10 AM      Result Value Range   Heparin Unfractionated 0.54  0.30 - 0.70 IU/mL   Comment:            IF HEPARIN RESULTS ARE BELOW     EXPECTED VALUES, AND PATIENT     DOSAGE HAS BEEN CONFIRMED,     SUGGEST FOLLOW UP TESTING     OF ANTITHROMBIN III LEVELS.  CBC     Status: None   Collection Time    08/15/13  5:10 AM      Result Value Range   WBC 7.1  4.0 - 10.5 K/uL   RBC 4.82  4.22 - 5.81 MIL/uL   Hemoglobin 14.5  13.0 - 17.0 g/dL   HCT 96.0  45.4 - 09.8 %   MCV 90.9  78.0 - 100.0 fL   MCH 30.1  26.0 - 34.0 pg   MCHC 33.1  30.0 - 36.0 g/dL   RDW 11.9  14.7 - 82.9 %   Platelets 203  150 - 400 K/uL  GLUCOSE, CAPILLARY     Status: Abnormal   Collection Time    08/15/13  7:47 AM      Result Value Range   Glucose-Capillary 188 (*) 70 - 99 mg/dL    Imaging: Imaging results have been reviewed  Assessment/Plan:   1. Principal Problem: 2.   Acute on chronic combined  systolic and diastolic congestive heart failure 3. Active Problems: 4.   Esophageal dysphagia 5.   Long term (current) use of anticoagulants 6.   H/O aortic valve replacement- St Jude 7.   CAD - CABG '90 with re do '94 and susbequent PCIs- last Myoview 5/14 low risk 8.   Tobacco abuse 9.   PVD prior SFA PTA with chronic LE disease, not amenable to PTA 10.   DM2 (diabetes mellitus, type 2) 11.   Accelerated hypertension on admission 12.   Hyperlipidemia LDL goal < 70 13.   Chest pain on admission MI r/o 14.   Cardiomyopathy, ischemic - EF 25-30% by echo 5/14 15.   COPD (chronic obstructive pulmonary disease) 16.   LBBB (left bundle branch block) 17.   Time Spent Directly with Patient:  20 minutes  Length of Stay:  LOS: 5 days   On IV hep to coumadin A/C for AVR. INR increasing (1.78). D/C home when > 2.2 Exam benign. No further CP. Low risk myoview (inf scar). ROV with Dr. Wyatt Portela 08/15/2013, 8:14 AM

## 2013-08-16 ENCOUNTER — Ambulatory Visit: Payer: PRIVATE HEALTH INSURANCE | Admitting: Cardiovascular Disease

## 2013-08-16 LAB — HEPARIN LEVEL (UNFRACTIONATED): Heparin Unfractionated: 0.55 IU/mL (ref 0.30–0.70)

## 2013-08-16 LAB — GLUCOSE, CAPILLARY: Glucose-Capillary: 184 mg/dL — ABNORMAL HIGH (ref 70–99)

## 2013-08-16 MED ORDER — WARFARIN SODIUM 5 MG PO TABS: 5.0000 mg | ORAL_TABLET | ORAL | Status: DC

## 2013-08-16 MED ORDER — RAMIPRIL 2.5 MG PO CAPS: 2.5000 mg | ORAL_CAPSULE | Freq: Two times a day (BID) | ORAL | Status: DC

## 2013-08-16 MED ORDER — FUROSEMIDE 40 MG PO TABS: 40.0000 mg | ORAL_TABLET | Freq: Every day | ORAL | Status: DC

## 2013-08-16 MED ORDER — METOPROLOL SUCCINATE ER 25 MG PO TB24: 75.0000 mg | ORAL_TABLET | Freq: Every day | ORAL | Status: DC

## 2013-08-16 MED ORDER — WARFARIN SODIUM 7.5 MG PO TABS
7.5000 mg | ORAL_TABLET | ORAL | Status: DC
  Filled 2013-08-16: qty 1

## 2013-08-16 MED ORDER — RAMIPRIL 2.5 MG PO CAPS
2.5000 mg | ORAL_CAPSULE | Freq: Two times a day (BID) | ORAL | Status: DC
  Administered 2013-08-16: 2.5 mg via ORAL
  Filled 2013-08-16 (×2): qty 1

## 2013-08-16 NOTE — Evaluation (Signed)
Physical Therapy Evaluation Patient Details Name: Tanner Pena MRN: [id number] DOB: 09-Sep-1940 Today's Date: 08/16/2013 Time: 1214-1222 PT Time Calculation (min): 8 min  PT Assessment / Plan / Recommendation History of Present Illness  72 y.o. male admitted to [facility] on 08/10/13 with CP.  Dx with CHF exacerbation and accelerated HTN.  Pt with significant PMHx including: Diabetes mellitus (2007); Hypertension; Gout; Hypercholesteremia; Peripheral vascular disease; Chronic pain; Sleep apnea; COPD (chronic obstructive pulmonary disease); S/P aortic valve replacement (1990); Chronic back pain; Dysphagia; Neuromuscular disorder; Peripheral neuropathy; and Critical lower limb ischemia.  Clinical Impression  Pt is at his mobility baseline.  No chest pain or SOB reported during gait.  HR and O2 sats stable. No acute or f/u PT needed.  PT to sign off. Pt is safe to go home with family's intermittent supervision.      PT Assessment  Patent does not need any further PT services    Follow Up Recommendations  No PT follow up    Does the patient have the potential to tolerate intense rehabilitation     NA  Barriers to Discharge   None      Equipment Recommendations  None recommended by PT    Recommendations for Other Services   None  Frequency   NA- one time and d/c   Precautions / Restrictions Precautions Precautions: Other (comment) Precaution Comments: limited gait distance due to what sounds like intermittent LE claudication symptoms.     Pertinent Vitals/Pain HR 65 and O2 sats 98% on RA.        Mobility  Bed Mobility Bed Mobility: Not assessed (pt seated EOB ) Transfers Transfers: Sit to Stand;Stand to Sit Sit to Stand: 7: Independent Stand to Sit: 7: Independent Ambulation/Gait Ambulation/Gait Assistance: 7: Independent Ambulation Distance (Feet): 250 Feet Assistive device: None Ambulation/Gait Assistance Details: This is about his max tolerable ambulation distance due to  what sounds like either back issues or intermittent claudication symptoms in his legs.  "I walk out the the chicken house and then I have to sit and rest.  After I have rested for a minute I can get up and keep going." Gait Pattern: Step-through pattern;Antalgic (pt has a mildly antalgic gait pattern PTA due to arthritis) Gait velocity: WNL Stairs: Yes Stairs Assistance: 6: Modified independent (Device/Increase time) Stair Management Technique: One rail Right;Forwards;Alternating pattern Number of Stairs: 9        PT Goals(Current goals can be found in the care plan section) Acute Rehab PT Goals Patient Stated Goal: to go home today PT Goal Formulation: No goals set, d/c therapy  Visit Information  Last PT Received On: 08/16/13 Assistance Needed: +1 History of Present Illness: 72 y.o. male admitted to [facility] on 08/10/13 with CP.  Dx with CHF exacerbation and accelerated HTN.  Pt with significant PMHx including: Diabetes mellitus (2007); Hypertension; Gout; Hypercholesteremia; Peripheral vascular disease; Chronic pain; Sleep apnea; COPD (chronic obstructive pulmonary disease); S/P aortic valve replacement (1990); Chronic back pain; Dysphagia; Neuromuscular disorder; Peripheral neuropathy; and Critical lower limb ischemia.       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  He report no history of falls (except tripping over hidden branches  in the woods), and reports at baseline he has very limited walking distance.   Communication Communication: No difficulties    Cognition   Cognition Arousal/Alertness: Awake/alert Behavior During Therapy: WFL for tasks assessed/performed Overall Cognitive Status: Within Functional Limits for tasks assessed    Extremity/Trunk Assessment Upper Extremity Assessment Upper Extremity Assessment: Overall WFL for tasks assessed Lower Extremity Assessment Lower Extremity Assessment: Overall WFL for tasks assessed Cervical / Trunk Assessment Cervical / Trunk Assessment: Normal      End of Session PT - End of Session Activity Tolerance: Patient tolerated treatment well Patient left: in bed;with call bell/phone within reach;with family/visitor present Nurse Communication: Mobility status    Lurena Joiner B. Clifton Kovacic, PT, DPT [phone]   08/16/2013, 2:34 PM

## 2013-08-16 NOTE — Progress Notes (Signed)
Subjective:  No SOB.   Objective:  Vital Signs in the last 24 hours: Temp:  [97.8 F (36.6 C)-98.4 F (36.9 C)] 97.8 F (36.6 C) (12/29 0500) Pulse Rate:  [50-56] 50 (12/29 0500) Resp:  [16-18] 18 (12/29 0500) BP: (125-137)/(41-53) 125/41 mmHg (12/29 0500) SpO2:  [95 %-96 %] 95 % (12/29 0500) Weight:  [211 lb 6.4 oz (95.89 kg)] 211 lb 6.4 oz (95.89 kg) (12/29 0500)  Intake/Output from previous day:  Intake/Output Summary (Last 24 hours) at 08/16/13 1610 Last data filed at 08/16/13 0600  Gross per 24 hour  Intake   1404 ml  Output   2350 ml  Net   -946 ml    Physical Exam: General appearance: alert, cooperative and no distress Lungs: clear to auscultation bilaterally Heart: regular rate and rhythm   Rate: 52  Rhythm: normal sinus rhythm and sinus bradycardia  Lab Results:  Recent Labs  08/15/13 0510  WBC 7.1  HGB 14.5  PLT 203   No results found for this basename: NA, K, CL, CO2, GLUCOSE, BUN, CREATININE,  in the last 72 hours No results found for this basename: TROPONINI, CK, MB,  in the last 72 hours  Recent Labs  08/16/13 0620  INR 2.37*    Imaging: Imaging results have been reviewed  Cardiac Studies:  Assessment/Plan:   Principal Problem:   Acute on chronic combined systolic and diastolic CHF Active Problems:   Accelerated hypertension on admission   Chest pain on admission MI r/o- Myoview negative 08/13/13   H/O aortic valve replacement- St Jude   CAD - CABG '90 with re do '94    PVD prior SFA PTA with chronic LE disease, not amenable to PTA   DM2 (diabetes mellitus, type 2)   Cardiomyopathy, 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 INR sub theraputic but rising, probably OK for discharge.  Will review discharge  meds with MD- He was on Lasix 40 mg daily prior to admission but had run out a week before admission, he could probably be discharged on Lasix 40 mg daily. Also would consider increasing Altace and stopping Hydralazine. We will arrange follow up.   Corine Shelter PA-C Beeper [phone] 08/16/2013, 9:09 AM  I have seen and evaluated the patient this AM along with Corine Shelter, PA. I agree with his findings, examination as well as impression recommendations. Feels well.    INR therapeutic today.  D/c IV Heparin. Convert to PO lasix.  Agree with increased ACE-I dose & d/c Hydralazine (for ease of adherence).  Will need TCM f/u set up with PA (& INR Check) & d/w Dr. Tresa Endo plans for reassessment of EF & possible ICD. Inferior scar with reduced EF.  -- no evidence to suggest Ischemia.  Marykay Lex, M.D., M.S. [facility] [address] [place], [place]  [address]  [phone] Pager # [phone] 08/16/2013 9:51 AM

## 2013-08-16 NOTE — Discharge Summary (Signed)
Physician Discharge Summary  Tanner Pena WJX:[id number] DOB: 18-Sep-1940 DOA: 08/10/2013  PCP: Cassell Smiles., MD  Admit date: 08/10/2013 Discharge date: 08/16/2013  Time spent: > 35 minutes  Recommendations for Outpatient Follow-up:  Please be sure to follow up with your Cardiologist in 1-2 weeks or per your discussion with them.  PT/INR will need to be rechecked.  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 Patient presented to the hospital complaining of chest discomfort.  Hospital Course:   Principal Problem:  Acute on chronic combined systolic and diastolic congestive heart failure: In the context of patient with history of aortic valve replacement St. Jude's valve, CAD status post CABG in 1990 with redo in 1994 and subsequent PCI s  - Cardiology currently managing, per their recommendations:  On IV hep to  coumadin A/C for AVR. INR increasing (1.78). D/C home when > 2.2  INR therapeutic today. D/c IV Heparin.  Convert to PO lasix. Agree with increased ACE-I dose & d/c Hydralazine (for ease of adherence).  Will need TCM f/u set up with PA (& INR Check) & d/w Dr. Tresa Endo plans for reassessment of EF & possible ICD.  Inferior scar with reduced EF. -- no evidence to suggest Ischemia.  - Discharge him home regimen of Coumadin prior to admission as recommended by pharmacy.  Active Problems:  DM2 (diabetes mellitus, type 2)  - Continue diabetic diet  -Patient to continue home regimen  Accelerated hypertension on admission  - Currently on amlodipine, imdur, metoprolol and ACE inhibitor  Hyperlipidemia LDL goal < 70  - Will continue statin and home regimen on discharge  Chest pain on admission MI r/o  - Cardiac enzymes x3 negative  - Cardiology on board while patient was in house and they will continue to assess patient as outpatient for further recommendations. 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% by echo 5/14  -Per cardiologist as listed above  COPD (chronic obstructive pulmonary disease)  - Currently compensated, plan will be to continue home regimen  Procedures:  Patient had Myoview while in house  Consultations:  Cardiology  Discharge Exam: Filed Vitals:   08/16/13 0500  BP: 125/41  Pulse: 50  Temp: 97.8 F (36.6 C)  Resp: 18    General: Pt in NAD, Alert and awake Cardiovascular:  RRR, no MRG Respiratory: CTA BL, no wheezes  Discharge Instructions  Discharge Orders   Future Appointments Provider Department Dept Phone   08/18/2013 12:00 PM Lorin [facility] [phone]   08/23/2013 10:20 AM Abelino Derrick, PA-C [facility] [phone]   08/23/2013 1:00 PM [facility] Rm 1 [place] [facility] [address] [phone]   Future Orders Complete By Expires    (HEART FAILURE PATIENTS) Call MD:  Anytime you have any of the following symptoms: 1) 3 pound weight gain in 24 hours or 5 pounds in 1 week 2) shortness of breath, with or without a dry hacking cough 3) swelling in the hands, feet or stomach 4) if you have to sleep on extra pillows at night in order to breathe.  As directed    Call MD for:  extreme fatigue  As directed    Call MD for:  persistant dizziness or light-headedness  As directed    Call MD for:  severe uncontrolled pain  As directed    Diet - low sodium heart healthy  As directed    Discharge instructions  As directed    Comments:     Recommend getting INR checked in 1-3 days.  Also will need followup with cardiology.   Increase activity slowly  As directed        Medication List         albuterol 2 MG tablet  Commonly known as:  PROVENTIL  Take 1 tablet by mouth 2 (two) times daily.     albuterol-ipratropium 18-103 MCG/ACT inhaler  Commonly known as:  COMBIVENT  Inhale 1 puff into the lungs 4 (four) times daily. Coughing/ Shortness of Breath     allopurinol 300 MG tablet  Commonly known as:  ZYLOPRIM  Take 300 mg by mouth at bedtime.     amLODipine 5 MG tablet  Commonly known as:  NORVASC  Take 5 mg by mouth every morning.     aspirin 81 MG tablet  Take 81 mg by mouth at bedtime.     fenofibrate 145 MG tablet  Commonly known as:  TRICOR  Take 1 tablet (145 mg total) by mouth daily.     fish oil-omega-3 fatty acids 1000 MG capsule  Take 1 capsule (1 g total) by mouth 2 (two) times daily.     furosemide 40 MG tablet  Commonly known as:  LASIX  Take 1 tablet (40 mg total) by mouth daily.     glimepiride 2 MG tablet  Commonly known as:  AMARYL  Take 2 mg by mouth 2 (two) times daily.     Iodine 2-2.4 % Soln  Apply 1 application topically daily. Applied to affected foot     isosorbide mononitrate 30 MG 24 hr tablet  Commonly known as:  IMDUR  Take 1 tablet (30 mg total) by mouth daily.     metoprolol  succinate 25 MG 24 hr tablet  Commonly known as:  TOPROL-XL  Take 3 tablets (75 mg total) by mouth daily.     oxyCODONE-acetaminophen 10-325 MG per tablet  Commonly known as:  PERCOCET  Take 1 tablet by mouth every 6 (six) hours as needed for pain.     potassium chloride SA 20 MEQ tablet  Commonly known as:  K-DUR,KLOR-CON  Take 20 mEq by mouth daily.     pregabalin 50 MG capsule  Commonly known as:  LYRICA  Take 50 mg by mouth at bedtime.  ramipril 2.5 MG capsule  Commonly known as:  ALTACE  Take 1 capsule (2.5 mg total) by mouth 2 (two) times daily.     rosuvastatin 20 MG tablet  Commonly known as:  CRESTOR  Take 1 tablet (20 mg total) by mouth at bedtime.     SANTYL ointment  Generic drug:  collagenase  Apply 1 application topically daily.     sitaGLIPtin 100 MG tablet  Commonly known as:  JANUVIA  Take 100 mg by mouth every morning.     SSD 1 % cream  Generic drug:  silver sulfADIAZINE  Apply 1 application topically daily.     warfarin 5 MG tablet  Commonly known as:  COUMADIN  Take 5-7.5 mg by mouth See admin instructions. Takes one tablet (5mg  total) every day but takes 7.5mg  total on Mondays and Fridays       No Known Allergies     Follow-up Information   Follow up with [facility], PA-C. (office will call you)    Specialty:  Cardiology   Contact information:   [address] [place] [address] [phone]        The results of significant diagnostics from this hospitalization (including imaging, microbiology, ancillary and laboratory) are listed below for reference.    Significant Diagnostic Studies: Nm Myocar Multi W/spect W/wall Motion / Ef  08/13/2013   CLINICAL DATA:  Chest pain  EXAM: MYOCARDIAL IMAGING WITH SPECT (REST AND PHARMACOLOGIC-STRESS)  GATED LEFT VENTRICULAR WALL MOTION STUDY  LEFT VENTRICULAR EJECTION FRACTION  TECHNIQUE: Standard myocardial SPECT imaging was performed after resting intravenous injection of  10 mCi Tc-23m sestamibi. Subsequently, intravenous infusion of Lexiscan was performed under the supervision of the Cardiology staff. At peak effect of the drug, 30 mCi Tc-5m sestamibi was injected intravenously and standard myocardial SPECT imaging was performed. Quantitative gated imaging was also performed to evaluate left ventricular wall motion, and estimate left ventricular ejection fraction.  COMPARISON:  None.  FINDINGS: SPECT: No stress-induced perfusion defect. There is a large fixed defect involving the inferior wall extending into the apex and anteroseptal regions.  Wall motion:  Severe global hypokinesis.  Ejection fraction: 30%. End-diastolic volume 250 cc. End systolic volume 176 cc.  IMPRESSION: No stress-induced ischemia. Fixed defect as described involving the inferior wall extending into the apex and anteroseptal region.   Electronically Signed   By: Maryclare Bean M.D.   On: 08/13/2013 11:43   Dg Chest Portable 1 View  08/10/2013   CLINICAL DATA:  Chest pain, shortness of Breath.  EXAM: PORTABLE CHEST - 1 VIEW  COMPARISON:  02/11/2013  FINDINGS: Previous median sternotomy. Mild cardiomegaly stable. Perihilar and bibasilar interstitial edema or infiltrates, new since previous exam. Blunting of left lateral costophrenic angle as before. . No definite effusion.  IMPRESSION: 1. Stable cardiomegaly with new bilateral interstitial edema or infiltrates.   Electronically Signed   By: Oley Balm M.D.   On: 08/10/2013 19:13    Microbiology: No results found for this or any previous visit (from the past 240 hour(s)).   Labs: Basic Metabolic Panel:  Recent Labs Lab 08/10/13 1841 08/11/13 0155  NA 141 141  K 4.0 3.7  CL 102 105  CO2 25 25  GLUCOSE 150* 154*  BUN 19 20  CREATININE 0.90 0.96  CALCIUM 10.3 9.6   Liver Function Tests:  Recent Labs Lab 08/10/13 1841 08/11/13 0155  AST 22 17  ALT 16 13  ALKPHOS 37* 32*  BILITOT 0.5 0.4  PROT 7.7 6.8  ALBUMIN 4.3 3.9   No  results found for this basename: LIPASE, AMYLASE,  in the last 168 hours No results found for this basename: AMMONIA,  in the last 168 hours CBC:  Recent Labs Lab 08/10/13 1841 08/11/13 0155 08/15/13 0510  WBC 6.9 7.9 7.1  HGB 15.4 14.4 14.5  HCT 46.0 42.5 43.8  MCV 90.4 90.2 90.9  PLT 200 197 203   Cardiac Enzymes:  Recent Labs Lab 08/10/13 1841 08/11/13 0155 08/11/13 0700 08/11/13 1240  TROPONINI <0.30 <0.30 <0.30 <0.30   BNP: BNP (last 3 results)  Recent Labs  08/10/13 1841  PROBNP 784.4*   CBG:  Recent Labs Lab 08/15/13 0747 08/15/13 1118 08/15/13 1603 08/15/13 2010 08/16/13 0810  GLUCAP 188* 199* 263* 197* 193*       Signed:  [name]  [facility] 08/16/2013, 11:07 AM

## 2013-08-16 NOTE — Progress Notes (Signed)
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 in anticipation of possible cath  No Known Allergies  Patient Measurements: Height: 5\' 10"  (177.8 cm) Weight: 211 lb 6.4 oz (95.89 kg) IBW/kg (Calculated) : 73 HDW: ~93kg  Vital Signs: Temp: 97.8 F (36.6 C) (12/29 0500) BP: 125/41 mmHg (12/29 0500) Pulse Rate: 50 (12/29 0500)  Labs:  Recent Labs  08/14/13 0500 08/15/13 0510 08/16/13 0620  HGB  --  14.5  --   HCT  --  43.8  --   PLT  --  203  --   LABPROT 17.1* 20.2* 25.1*  INR 1.43 1.78* 2.37*  HEPARINUNFRC 0.59 0.54 0.55    Estimated Creatinine Clearance: 80.9 ml/min (by C-G formula based on Cr of 0.96).  Assessment: 72 y/o with CP, warfarin PTA for mechanical AVR, on heparin bridge therapy. S/p myoveiw negative no plans for cath, restarted coumadin on 12/26 at baseline INR of 1.69. HL is therapeutic today. Last CBC and renal function stable.  INR is 2.37 today after 3 doses of coumadin 10 mg.  Large 5 sec jump in protime.  Home coumadin dose PTA was 5 mg daily except 7.5 mg on Mondays and Fridays.  INR was therapeutic on admit.  Coumadin was held x 3 days during admission.   To DC home today.   Goal of Therapy:  INR 2.5-3.5   Plan:  DC home on previous coumadin dose of coumadin 7.5 mg on Monday and Fridays and 5 mg all other days with OP INR f/u.  He will get 7.5 mg today Herby Abraham, Pharm.D. [phone] 08/16/2013 10:45 AM

## 2013-08-18 ENCOUNTER — Encounter: Payer: Self-pay | Admitting: Cardiology

## 2013-08-18 ENCOUNTER — Ambulatory Visit (INDEPENDENT_AMBULATORY_CARE_PROVIDER_SITE_OTHER): Payer: PRIVATE HEALTH INSURANCE | Admitting: Cardiology

## 2013-08-18 VITALS — BP 130/80 | HR 62 | Resp 12

## 2013-08-18 DIAGNOSIS — I10 Essential (primary) hypertension: Secondary | ICD-10-CM

## 2013-08-18 DIAGNOSIS — I2589 Other forms of chronic ischemic heart disease: Secondary | ICD-10-CM

## 2013-08-18 DIAGNOSIS — I739 Peripheral vascular disease, unspecified: Secondary | ICD-10-CM

## 2013-08-18 DIAGNOSIS — I5043 Acute on chronic combined systolic (congestive) and diastolic (congestive) heart failure: Secondary | ICD-10-CM

## 2013-08-18 DIAGNOSIS — I2510 Atherosclerotic heart disease of native coronary artery without angina pectoris: Secondary | ICD-10-CM

## 2013-08-18 DIAGNOSIS — R079 Chest pain, unspecified: Secondary | ICD-10-CM

## 2013-08-18 DIAGNOSIS — E119 Type 2 diabetes mellitus without complications: Secondary | ICD-10-CM

## 2013-08-18 DIAGNOSIS — Z954 Presence of other heart-valve replacement: Secondary | ICD-10-CM

## 2013-08-18 DIAGNOSIS — J449 Chronic obstructive pulmonary disease, unspecified: Secondary | ICD-10-CM

## 2013-08-18 DIAGNOSIS — Z952 Presence of prosthetic heart valve: Secondary | ICD-10-CM

## 2013-08-18 DIAGNOSIS — I447 Left bundle-branch block, unspecified: Secondary | ICD-10-CM

## 2013-08-18 DIAGNOSIS — I255 Ischemic cardiomyopathy: Secondary | ICD-10-CM

## 2013-08-18 DIAGNOSIS — I509 Heart failure, unspecified: Secondary | ICD-10-CM

## 2013-08-18 MED ORDER — RAMIPRIL 2.5 MG PO CAPS: 2.5000 mg | ORAL_CAPSULE | Freq: Two times a day (BID) | ORAL | Status: DC

## 2013-08-18 NOTE — Assessment & Plan Note (Signed)
Improved

## 2013-08-18 NOTE — Patient Instructions (Signed)
Your physician recommends that you schedule a follow-up appointment in: 4 weeks with Dr Kelly  

## 2013-08-18 NOTE — Assessment & Plan Note (Signed)
30% by Myoview 12/14.

## 2013-08-18 NOTE — Assessment & Plan Note (Signed)
Recent admission after he ran out of his Lasix

## 2013-08-18 NOTE — Assessment & Plan Note (Signed)
Current smoker 

## 2013-08-18 NOTE — Progress Notes (Signed)
08/18/2013 Tanner Pena   May 06, 1941  [id number]  Primary Physicia Cassell Smiles., MD Primary Cardiologist: Dr Tresa Endo   HPI:  The patient is a 72 year old, moderately overweight male followed by Dr Tresa Endo. [place] is a widower [place]. His daughters live nearby. He is a smoker. Dr Allyson Sabal follows him for peripheral vascular disease. He has a history of CAD status post CABG in 1990 with a St. Jude AVR at that time. He had redo CABG in 1994. He has had percutaneous intervention to his vein grafts in Oct 2006 His last Myoview 5/14 was intermediate risk and he was treated medically. His EF by echo 5/14 was 25-30%. His other problems include continued tobacco abuse, hypertension, COPD, hyperlipidemia, type 2 diabetes, and obstructive sleep apnea.           Dr Allyson Sabal has previously stented his SFAs bilaterally, as well as his right external iliac artery. He has a nonhealing ulcer on his left great toe with some cyanosis of his left foot. The patient had abdominal aortography with bilateral runoff and potential endovascular therapy for critical limb ischemia on 02/15/13 revealing patent SFA stents bilaterally with severe infrapopliteal disease. He has been treated conservatively and this has been slowly improving. He can now wear shoes.Tanner Pena           He was admitted 08/10/13 with chest pain and dyspnea.He apparently ran out of his Lasix a week prior to admission. The day of admission he had Lt chest pain while feeding his chickens. This lasted about 15 minutes. He then took a walk to a friends house and by the time he got there he had severe SSCP and asked her to call EMS. His Troponin's were negative X 3. His CXR suggested CHF, BNP was 748. His B/P on admission was 206/117. He was put on IV diuretics and his Coumadin was held. A Myoview was remarkable for scar but no ischemia. His Coumadin was resumed. His medications were adjusted for B/P control. At discharge we increased his ACE and stopped his  Hydralazine.There was a discussion about the possibility of an ICD, or even a Biv ICD. At discharge it was decided to see how his LVF does with improved medical Rx. He was scheduled for a TCM follow up but this got scrambled at discharge. He showed up today when today was supposed to be a phone call only. In any event he has done well since discharge. His (?) granddaughter and her two children accompanied him today. He denies any increased SOB. He is having o problems with his medications.    Current Outpatient Prescriptions  Medication Sig Dispense Refill  . albuterol (PROVENTIL) 2 MG tablet Take 1 tablet by mouth 2 (two) times daily.      Tanner Pena albuterol-ipratropium (COMBIVENT) 18-103 MCG/ACT inhaler Inhale 1 puff into the lungs 4 (four) times daily. Coughing/ Shortness of Breath      . allopurinol (ZYLOPRIM) 300 MG tablet Take 300 mg by mouth at bedtime.       Tanner Pena amLODipine (NORVASC) 5 MG tablet Take 5 mg by mouth every morning.       Tanner Pena aspirin 81 MG tablet Take 81 mg by mouth at bedtime.       . fenofibrate (TRICOR) 145 MG tablet Take 1 tablet (145 mg total) by mouth daily.  30 tablet  6  . fish oil-omega-3 fatty acids 1000 MG capsule Take 1 capsule (1 g total) by mouth 2 (two) times daily.      Tanner Pena  furosemide (LASIX) 40 MG tablet Take 1 tablet (40 mg total) by mouth daily.  15 tablet  0  . glimepiride (AMARYL) 2 MG tablet Take 2 mg by mouth 2 (two) times daily.       . Iodine 2-2.4 % SOLN Apply 1 application topically daily. Applied to affected foot      . isosorbide mononitrate (IMDUR) 30 MG 24 hr tablet Take 1 tablet (30 mg total) by mouth daily.  30 tablet  5  . metoprolol succinate (TOPROL-XL) 25 MG 24 hr tablet Take 3 tablets (75 mg total) by mouth daily.  60 each  0  . oxyCODONE-acetaminophen (PERCOCET) 10-325 MG per tablet Take 1 tablet by mouth every 6 (six) hours as needed for pain.       . potassium chloride SA (K-DUR,KLOR-CON) 20 MEQ tablet Take 20 mEq by mouth daily.        . pregabalin  (LYRICA) 50 MG capsule Take 50 mg by mouth at bedtime.       . ramipril (ALTACE) 2.5 MG capsule Take 1 capsule (2.5 mg total) by mouth 2 (two) times daily.  30 capsule  11  . rosuvastatin (CRESTOR) 20 MG tablet Take 1 tablet (20 mg total) by mouth at bedtime.  30 tablet  11  . SANTYL ointment Apply 1 application topically daily.       . sitaGLIPtin (JANUVIA) 100 MG tablet Take 100 mg by mouth every morning.       Tanner Pena SSD 1 % cream Apply 1 application topically daily.       Tanner Pena warfarin (COUMADIN) 5 MG tablet Take 5-7.5 mg by mouth See admin instructions. Takes one tablet (5mg  total) every day but takes 7.5mg  total on Mondays and Fridays       No current facility-administered medications for this visit.    No Known Allergies  History   Social History  . Marital Status: Legally Separated    Spouse Name: N/A    Number of Children: 5  . Years of Education: N/A   Occupational History  . retired     Air traffic controller   Social History Main Topics  . Smoking status: Current Every Day Smoker -- 1.00 packs/day for 55 years    Types: Cigarettes    Start date: 08/19/1953  . Smokeless tobacco: Former Neurosurgeon    Types: Chew    Quit date: 08/20/1995     Comment: Has quit on 3 occasions. Counseling given today 5-10 minutes   I am more than likely going to quit "  . Alcohol Use: Yes     Comment: socially, sometimes 12 ounce beer daily, may go month without/no whiskey  . Drug Use: No  . Sexual Activity: Not on file   Other Topics Concern  . Not on file   Social History Narrative   Has 3 daughters   Has 2 sons     Review of Systems: General: negative for chills, fever, night sweats or weight changes.  Cardiovascular: negative for chest pain, dyspnea on exertion, edema, orthopnea, palpitations, paroxysmal nocturnal dyspnea or shortness of breath Dermatological: negative for rash Respiratory: negative for cough or wheezing Urologic: negative for hematuria Abdominal: negative for nausea,  vomiting, diarrhea, bright red blood per rectum, melena, or hematemesis Neurologic: negative for visual changes, syncope, or dizziness All other systems reviewed and are otherwise negative except as noted above.    There were no vitals taken for this visit.  General appearance: alert, cooperative, no distress and moderately obese Lungs:  scattered rhonchi Heart: regular rate and rhythm and 2/6 systolic murmur, positive valve sounds    ASSESSMENT AND PLAN:   Acute on chronic combined systolic and diastolic CHF Recent admission after he ran out of his Lasix  Chest pain on admission MI r/o- Myoview negative 08/13/13 .  Cardiomyopathy, ischemic - EF 25-30% by echo 5/14 30% by Myoview 12/14.  Accelerated hypertension on admission Improved.  H/O aortic valve replacement- St Jude .  CAD - CABG '90 with re do '94  .  DM2 (diabetes mellitus, type 2) .  LBBB (left bundle branch block) .  COPD (chronic obstructive pulmonary disease) Current smoker  PVD prior SFA PTA with chronic LE disease, not amenable to PTA .   PLAN: I cancelled his echo for Monday, it seems too early to re ascess his LVF, as well as his TCM office visit. He gets his INR in [place] and will have this done there Monday with results to [facility] as usual. I will arrange a follow up with Dr Tresa Endo in 3-4 weeks. At that time his medications can be adjusted or, if Dr Tresa Endo feels he is stable, he can go ahead and get a follow up echo and discuss ICD or possibly a BiV ICD if indicated with Dr Tresa Endo.   Markos Theil KPA-C 08/18/2013 1:04 PM

## 2013-08-18 NOTE — Progress Notes (Deleted)
   Patient ID: Tanner Pena, male    DOB: 03-15-1941, 72 y.o.   MRN: [id number]  HPI    Review of Systems    Physical Exam

## 2013-08-20 ENCOUNTER — Other Ambulatory Visit: Payer: Self-pay | Admitting: Cardiovascular Disease

## 2013-08-23 ENCOUNTER — Ambulatory Visit: Payer: PRIVATE HEALTH INSURANCE | Admitting: Cardiology

## 2013-08-23 ENCOUNTER — Ambulatory Visit (HOSPITAL_COMMUNITY): Payer: PRIVATE HEALTH INSURANCE

## 2013-08-31 ENCOUNTER — Telehealth: Payer: Self-pay | Admitting: Cardiovascular Disease

## 2013-08-31 NOTE — Telephone Encounter (Signed)
Returned call and pt verified x 2 w/ Museum/gallery conservator, pt's daughter.  Stated pt wants to know if it's okay if he takes 80 mg of Lasix.  Stated he feels like he is retaining fluid.  Stated pt had not been weighing daily, but not at the same time.  Stated she told pt he needs to weigh at the same time every day w/ just his underwear and a t-shirt.  RN advised he also do this in the AM after urinating.  Daughter verbalized understanding and stated she will inform pt.  Stated pt has been checking daily AM weights for the past 3 days, but did not tell her the weights and she was not able to get back in contact w/ him before RN called her back.  RN advised she find out what pt's weight is today and was yesterday so that we can advise on Lasix.  Verbalized understanding and will call back.

## 2013-08-31 NOTE — Telephone Encounter (Signed)
Etoile Looman called back.  Stated pt weighed 212 lbs when he left the hospital and didn't have a scale at first when he got home.  First weight was at 217 lbs and for the past 3 days pt has been at 216 lbs.  Stated pt took 80 mg Lasix yesterday and no change in weight, still 216 lbs.  Pt w/o SOB or breathing problems and c/o productive cough w/ brown, sticky mucous.  Stated pt has had the cough since last week.  Daughter advised pt should see PCP r/t cough as he could have an infection.  Informed Dr. [name] will be notified r/t weight and Lasix, but cough sounds like it may be an infection.  RN again advised pt see PCP and if he thinks symptoms r/t heart, then call back.  Verbalized understanding and agreed w/ plan.    Message forwarded to Dr. [name] for review.

## 2013-08-31 NOTE — Telephone Encounter (Signed)
Says her father is coughing a lot and he thinks he is retaining fluid  Currently on 40 mg lasix.  Is it ok to take 80 mg lasix.  Also needs to know if coumadin dosage changed.  Has not heard back on coumadin dosage since last check.  Please call.

## 2013-09-02 ENCOUNTER — Other Ambulatory Visit: Payer: Self-pay | Admitting: *Deleted

## 2013-09-02 MED ORDER — ISOSORBIDE MONONITRATE ER 30 MG PO TB24: 30.0000 mg | ORAL_TABLET | Freq: Every day | ORAL | Status: DC

## 2013-09-02 NOTE — Telephone Encounter (Signed)
Rx was sent to pharmacy electronically. 

## 2013-09-03 ENCOUNTER — Ambulatory Visit (INDEPENDENT_AMBULATORY_CARE_PROVIDER_SITE_OTHER): Payer: PRIVATE HEALTH INSURANCE | Admitting: Pharmacist Clinician (PhC)/ Clinical Pharmacy Specialist

## 2013-09-03 DIAGNOSIS — Z954 Presence of other heart-valve replacement: Secondary | ICD-10-CM

## 2013-09-03 DIAGNOSIS — Z952 Presence of prosthetic heart valve: Secondary | ICD-10-CM

## 2013-09-03 DIAGNOSIS — Z7901 Long term (current) use of anticoagulants: Secondary | ICD-10-CM

## 2013-09-08 ENCOUNTER — Other Ambulatory Visit: Payer: Self-pay | Admitting: *Deleted

## 2013-09-08 MED ORDER — RAMIPRIL 2.5 MG PO CAPS: 2.5000 mg | ORAL_CAPSULE | Freq: Two times a day (BID) | ORAL | Status: DC

## 2013-09-08 NOTE — Telephone Encounter (Signed)
Patient called to inform that his prescription for Ramipril was incorrect. He was given the wrong qty. Resent the prescription for a 90 day supply per patient's request to [facility].

## 2013-09-20 ENCOUNTER — Ambulatory Visit (INDEPENDENT_AMBULATORY_CARE_PROVIDER_SITE_OTHER): Payer: PRIVATE HEALTH INSURANCE | Admitting: Cardiovascular Disease

## 2013-09-20 ENCOUNTER — Encounter: Payer: Self-pay | Admitting: Cardiovascular Disease

## 2013-09-20 VITALS — BP 110/60 | HR 58 | Ht 70.5 in | Wt 220.2 lb

## 2013-09-20 DIAGNOSIS — R5383 Other fatigue: Secondary | ICD-10-CM

## 2013-09-20 DIAGNOSIS — I6529 Occlusion and stenosis of unspecified carotid artery: Secondary | ICD-10-CM

## 2013-09-20 DIAGNOSIS — I447 Left bundle-branch block, unspecified: Secondary | ICD-10-CM

## 2013-09-20 DIAGNOSIS — F17200 Nicotine dependence, unspecified, uncomplicated: Secondary | ICD-10-CM

## 2013-09-20 DIAGNOSIS — I739 Peripheral vascular disease, unspecified: Secondary | ICD-10-CM

## 2013-09-20 DIAGNOSIS — R5381 Other malaise: Secondary | ICD-10-CM

## 2013-09-20 DIAGNOSIS — J449 Chronic obstructive pulmonary disease, unspecified: Secondary | ICD-10-CM

## 2013-09-20 DIAGNOSIS — Z7901 Long term (current) use of anticoagulants: Secondary | ICD-10-CM

## 2013-09-20 DIAGNOSIS — E782 Mixed hyperlipidemia: Secondary | ICD-10-CM

## 2013-09-20 DIAGNOSIS — I2510 Atherosclerotic heart disease of native coronary artery without angina pectoris: Secondary | ICD-10-CM

## 2013-09-20 DIAGNOSIS — Z720 Tobacco use: Secondary | ICD-10-CM

## 2013-09-20 DIAGNOSIS — I359 Nonrheumatic aortic valve disorder, unspecified: Secondary | ICD-10-CM

## 2013-09-20 DIAGNOSIS — E119 Type 2 diabetes mellitus without complications: Secondary | ICD-10-CM

## 2013-09-20 DIAGNOSIS — Z79899 Other long term (current) drug therapy: Secondary | ICD-10-CM

## 2013-09-20 NOTE — Patient Instructions (Signed)
Your physician recommends that you schedule a follow-up appointment in: 2 Months  Your physician has requested that you have an echocardiogram. Echocardiography is a painless test that uses sound waves to create images of your heart. It provides your doctor with information about the size and shape of your heart and how well your heart's chambers and valves are working. This procedure takes approximately one hour. There are no restrictions for this procedure. Next Month  Your physician recommends that you return for lab work in: 1 month CBC,CMP,TSH,FASTING LIPIDS, A1C

## 2013-09-20 NOTE — Progress Notes (Signed)
Patient ID: Tanner Pena, male   DOB: 13-Feb-1941, 73 y.o.   MRN: [id number]    HPI: Tanner Pena is a 73 y.o. male who presents to the office today for six-month Cardiologic followup evaluation  Tanner Pena is years old WM with established coronary artery disease, history of aortic stenosis, as well as peripheral vascular disease. In 1991 he underwent initial CABG revascularization surgery to his RCA and also underwent St. Jude's aortic valve replacement surgery for aortic valve stenosis. In 1994, he required redo CABG surgery to his left coronary system which was not bypassed in 1991.  In 2005, a stent was placed in his RCA vein graft the patient has significant peripheral vascular disease and is status post intervention to both his right iliac and bilateral SFAs with Dr. [name] and has also undergone rotational atherectomy of his SFAs. Additionally, he has evidence for mild to moderate carotid stenosis, a history of hypertension, ongoing tobacco use, type 2 diabetes mellitus, as well as obstructive sleep apnea. A carotid Doppler study done in September 2013 showed moderate amount of fibrous plaque and right carotid internal stenosis of less than 49% both the right external carotid narrowing of 70-99%. 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 His left  internal carotid revealed 50-69% of diameter reduction. Tanner Pena denies recent episodes of chest pain. He continues to smoke cigarettes less than one pack per day and has been smoking approximately 60 years , although he did quit on 3 occasions. He does note shortness of breath with activity. He denies loss of symptoms of claudication.  He was recently hospitalized on December 22 for recurrent chest pain symptoms. Cardiac enzymes were negative. He was hypertensive on admission with a blood pressure of 206/117. He was treated with  diuretic therapy and initially his Coumadin was held. A Myoview study was done which showed scar without ischemia and consequently Coumadin was resumed and cardiac catheterization was not done. At discharge, he is a sedation was increased to he does have ongoing tobacco use. His ejection fraction on his Myoview study in December 2014 was 30%. He does have chronic left bundle branch block. He has COPD. He also has continued claudication in the left lower extremity due to chronic disease not amenable to PTA.  Past Medical History  Diagnosis Date  . Diabetes mellitus 2007  . Hypertension   . Gout   . Hypercholesteremia   . Peripheral vascular disease     stents lower extremity  . Chronic pain     foot, hips  . Sleep apnea   . COPD (chronic obstructive pulmonary disease)   . 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 Chronic back pain   . Dysphagia   . Neuromuscular disorder   . Peripheral neuropathy   . Critical lower limb ischemia     Past Surgical History  Procedure Laterality Date  . Back surgery  2119,4174    2  . Open heart surgery  1990    prosthetic heart valve, one bypass  . Rotator cuff repair      right  . Cataract extraction      bilateral  . Coronary stent placement  2005    RCA vein graft A 3.0x13.0 TAXUS stent was then placed int he vessel a Viva 3.0x4.0 (perfusion balloon was made ready it was placed through the entire lenght of  the stent  . Peripheral vascular procedures lower extremities      right external iliac  artery PTA and stenting as well as bilateral SFA intervention remotely. Repeat procedures in 2011 bilaterally  . Coronary artery bypass graft  1994    6 vessels  . [name]  06/13/2011    Procedure: Venia Minks DILATION;  Surgeon: [name], MD;  Location: [facility];  Service: Endoscopy;  Laterality: N/A;  Dilated to 56.   . Angioplasty illiac artery      No Known Allergies  Current Outpatient Prescriptions  Medication Sig Dispense  Refill  . albuterol (VOSPIRE ER) 4 MG 12 hr tablet 4 mg 2 (two) times daily.      [name] albuterol-ipratropium (COMBIVENT) 18-103 MCG/ACT inhaler Inhale 1 puff into the lungs 4 (four) times daily. Coughing/ Shortness of Breath      . allopurinol (ZYLOPRIM) 300 MG tablet Take 300 mg by mouth at bedtime.       [name] amLODipine (NORVASC) 5 MG tablet Take 5 mg by mouth every morning.       [name] aspirin 81 MG tablet Take 81 mg by mouth at bedtime.       . fenofibrate (TRICOR) 145 MG tablet Take 1 tablet (145 mg total) by mouth daily.  30 tablet  6  . fish oil-omega-3 fatty acids 1000 MG capsule Take 1 capsule (1 g total) by mouth 2 (two) times daily.      . furosemide (LASIX) 40 MG tablet Take 40 mg by mouth daily. 40 mg  Daily additional 40 mg as needed.      [name] glimepiride (AMARYL) 2 MG tablet Take 2 mg by mouth 2 (two) times daily.       . Iodine 2-2.4 % SOLN Apply 1 application topically daily. Applied to affected foot      . isosorbide mononitrate (IMDUR) 30 MG 24 hr tablet Take 1 tablet (30 mg total) by mouth daily.  30 tablet  11  . metoprolol succinate (TOPROL-XL) 25 MG 24 hr tablet Take 3 tablets (75 mg total) by mouth daily.  60 each  0  . oxyCODONE-acetaminophen (PERCOCET) 10-325 MG per tablet Take 1 tablet by mouth every 6 (six) hours as needed for pain.       . potassium chloride SA (K-DUR,KLOR-CON) 20 MEQ tablet Take 20 mEq by mouth daily.        . pregabalin (LYRICA) 50 MG capsule Take 50 mg by mouth at bedtime.       . ramipril (ALTACE) 2.5 MG capsule Take 1 capsule (2.5 mg total) by mouth 2 (two) times daily.  180 capsule  3  . rosuvastatin (CRESTOR) 20 MG tablet Take 1 tablet (20 mg total) by mouth at bedtime.  30 tablet  11  . sitaGLIPtin (JANUVIA) 100 MG tablet Take 100 mg by mouth every morning.       . warfarin (COUMADIN) 5 MG tablet Take 5-7.5 mg by mouth See admin instructions. Takes one tablet (5mg  total) every day but takes 7.5mg  total on Mondays and Fridays       No current  facility-administered medications for this visit.    Socially, he is married has 4 children 8 grandchildren. He does not routinely exercise he denies alcohol use. He is smoking one half to less than one pack per day. 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   ROS is negative for fevers chills or night sweats. He denies change  in vision or hearing. At times he does note some neck discomfort. He does note shortness of breath with activity. At times he does note some mild wheezing. He denies increased cough. Eyes any further chest pain leading to his recent hospitalization. He is unaware of tachycardia palpitations. He does have a small umbilical hernia he denies bleeding per he denies worsening claudication. He does note shortness of breath with activity per he does note leg swelling particularly in the left intermittently. He denies recent bleeding episodes. He denies changes in bowel or bladder. He denies nausea vomiting or diarrhea. He does have diabetes. There is no history of hypothyroidism. At times he does note some rare lightheadedness he stands abruptly. He does have hyperlipidemia. Other comprehensive 14 point system review is negative   PE BP 110/60  Pulse 58  Ht 5' 10.5" (1.791 m)  Wt 220 lb 3.2 oz (99.882 kg)  BMI 31.14 kg/m2  General: Alert, oriented, no distress.  HEENT: Normocephalic, atraumatic. Pupils round and reactive; sclera anicteric; no Mouth/Parynx benign; Mallinpatti scale 3 Neck: No JVD, bilateral carotid briuts Lungs: decreased breath sounds; no wheezing or rales Chest wall: No tenderness to palpation Heart: RRR, s1 s2 normal  2/6 SEM with crisp prosthetic valve sounds no S3 gallop. No rub. No heaves Abdomen: soft, nontender; no hepatosplenomehaly, BS+; abdominal aorta nontender and not dilated by palpation. Small umbilical protrusion Back: No CVA tenderness Pulses 2+ upper, slightly diminished lower extremities. Extremities: no clubbinbg  cyanosis or edema, Homan's sign negative; L great toe bandaged  Neurologic: grossly nonfocal Psychological: Normal affect and mood   ECG (independently read by me): Sinus bradycardia 58 beats per minute. First repeat block;  left bundle branch   LABS:      Basic Metabolic Panel: No results found for this basename: NA, K, CL, CO2, GLUCOSE, BUN, CREATININE, CALCIUM, MG, PHOS,  in the last 72 hours Liver Function Tests: No results found for this basename: AST, ALT, ALKPHOS, BILITOT, PROT, ALBUMIN,  in the last 72 hours No results found for this basename: LIPASE, AMYLASE,  in the last 72 hours CBC: No results found for this basename: WBC, NEUTROABS, HGB, HCT, MCV, PLT,  in the last 72 hours Cardiac Enzymes: No results found for this basename: CKTOTAL, CKMB, CKMBINDEX, TROPONINI,  in the last 72 hours BNP: No components found with this basename: POCBNP,  D-Dimer: No results found for this basename: DDIMER,  in the last 72 hours Hemoglobin A1C: No results found for this basename: HGBA1C,  in the last 72 hours Fasting Lipid Panel: No results found for this basename: CHOL, HDL, LDLCALC, TRIG, CHOLHDL, LDLDIRECT,  in the last 72 hours Thyroid Function Tests: No results found for this basename: TSH, T4TOTAL, FREET3, T3FREE, THYROIDAB,  in the last 72 hours Anemia Panel: No results found for this basename: VITAMINB12, FOLATE, FERRITIN, TIBC, IRON, RETICCTPCT,  in the last 72 hours  RADIOLOGY: US Arterial Seg Single  12/24/2012   *RADIOLOGY REPORT*  Clinical Data: Diabetes, nonhealing great toe ulcer, diabetes, hypertension  NONINVASIVE PHYSIOLOGIC VASCULAR STUDY OF BILATERAL LOWER EXTREMITIES  Technique:  Evaluation of both lower extremities were performed at rest, including calculation of ankle-brachial indices with single level Doppler, pressure and pulse volume recording.  Comparison:  None.  Findings:  Right ABI: 1.07  Left ABI: 0.68  Right Lower Extremity: Triphasic right tibial wave form  and a biphasic right dorsalis pedis wave form.  No significant pressure gradient.  Normal ABI.  Right toe pressure 86.  Left  Lower Extremity: Irregular biphasic left tibial tracing and irregular monophasic left dorsalis pedis tracing.  48 mmHg pressure gradient in the left ankle compared to the left brachial pressure. This results in an abnormal ABI measuring 0.68.  Left toe pressure could not be obtained.  IMPRESSION: Abnormal left ABI measuring 0.68.  This is indicative of left lower extremity significant vascular disease.  Normal right ABI.   Original Report Authenticated By: Jerilynn Mages. Shick, M.D.      ASSESSMENT AND PLAN:   From a cardiovascular standpoint, Tanner. Holness is now 24 years status post initial CBG surgery to his right coronary artery at which time he underwent St. Jude aortic valve replacement surgery for  severe aortic valve stenosis the. He is 21 years status post CABG surgery to his left coronary system and 9 years status post stenting to his RCA vein graft. We again discussed importance of complete smoking cessation. Unfortunately he continues to smoke. He is on Coumadin for anticoagulation with his St. Jude mechanical prosthesis Today, his blood pressure is controlled at 110/60 on his medical regimen consisting of amlodipine 5 mg, furosemide 40 , ramipril 5 mg as well as metoprolol succinate 75 mg daily. He's now on ACE in addition. At times he does note some very mild lightheadedness. For this reason I will not further titrate his Altace to 10 mg. He is bradycardic on current regimen. He is on lipid-lowering therapy with both Crestor 20 mg as well as fenofibrate 145 mg. He is on Januvia in addition to his amaryl for his diabetes mellitus. In one month he will undergo complete set of repeat laboratory. We will check an echo Doppler study as well to reassess systolic and diastolic function and I will see him in followup for further evaluation  Jaeson Molstad A 09/20/2013 6:37 PM

## 2013-09-23 ENCOUNTER — Other Ambulatory Visit: Payer: Self-pay | Admitting: Pharmacist Clinician (PhC)/ Clinical Pharmacy Specialist

## 2013-09-23 MED ORDER — WARFARIN SODIUM 5 MG PO TABS: ORAL_TABLET | ORAL | Status: DC

## 2013-10-18 ENCOUNTER — Other Ambulatory Visit: Payer: Self-pay | Admitting: Cardiovascular Disease

## 2013-10-18 LAB — PROTIME-INR
INR: 2.01 — ABNORMAL HIGH (ref <1.50)
PROTHROMBIN TIME: 22.3 s — AB (ref 11.6–15.2)

## 2013-10-18 LAB — CBC
HEMATOCRIT: 44.1 % (ref 39.0–52.0)
HEMOGLOBIN: 14.6 g/dL (ref 13.0–17.0)
MCH: 29.6 pg (ref 26.0–34.0)
MCHC: 33.1 g/dL (ref 30.0–36.0)
MCV: 89.5 fL (ref 78.0–100.0)
Platelets: 216 10*3/uL (ref 150–400)
RBC: 4.93 MIL/uL (ref 4.22–5.81)
RDW: 14.9 % (ref 11.5–15.5)
WBC: 7 10*3/uL (ref 4.0–10.5)

## 2013-10-19 LAB — LIPID PANEL
CHOL/HDL RATIO: 3.9 ratio
Cholesterol: 118 mg/dL (ref 0–200)
HDL: 30 mg/dL — ABNORMAL LOW (ref >39)
LDL Cholesterol: 61 mg/dL (ref 0–99)
Triglycerides: 136 mg/dL (ref <150)
VLDL: 27 mg/dL (ref 0–40)

## 2013-10-19 LAB — COMPREHENSIVE METABOLIC PANEL
ALK PHOS: 26 U/L — AB (ref 39–117)
ALT: 15 U/L (ref 0–53)
AST: 22 U/L (ref 0–37)
Albumin: 4.3 g/dL (ref 3.5–5.2)
BUN: 29 mg/dL — AB (ref 6–23)
CO2: 24 mEq/L (ref 19–32)
CREATININE: 1.04 mg/dL (ref 0.50–1.35)
Calcium: 9.8 mg/dL (ref 8.4–10.5)
Chloride: 106 mEq/L (ref 96–112)
Glucose, Bld: 152 mg/dL — ABNORMAL HIGH (ref 70–99)
POTASSIUM: 4.4 meq/L (ref 3.5–5.3)
Sodium: 140 mEq/L (ref 135–145)
Total Bilirubin: 0.5 mg/dL (ref 0.2–1.2)
Total Protein: 6.4 g/dL (ref 6.0–8.3)

## 2013-10-19 LAB — TSH: TSH: 1.909 u[IU]/mL (ref 0.350–4.500)

## 2013-10-19 LAB — HEMOGLOBIN A1C
HEMOGLOBIN A1C: 6.9 % — AB (ref <5.7)
MEAN PLASMA GLUCOSE: 151 mg/dL — AB (ref <117)

## 2013-10-20 ENCOUNTER — Ambulatory Visit (HOSPITAL_COMMUNITY)
Admission: RE | Admit: 2013-10-20 | Discharge: 2013-10-20 | Disposition: A | Discharge status: Home / Self Care | Payer: PRIVATE HEALTH INSURANCE | Source: Ambulatory Visit | Attending: Cardiovascular Disease | Admitting: Cardiovascular Disease

## 2013-10-20 DIAGNOSIS — I359 Nonrheumatic aortic valve disorder, unspecified: Secondary | ICD-10-CM | POA: Insufficient documentation

## 2013-10-20 DIAGNOSIS — I2510 Atherosclerotic heart disease of native coronary artery without angina pectoris: Secondary | ICD-10-CM | POA: Insufficient documentation

## 2013-10-20 NOTE — Progress Notes (Signed)
2D Echo Performed 10/20/2013    Tanner Pena, RCS  

## 2013-10-22 ENCOUNTER — Ambulatory Visit (INDEPENDENT_AMBULATORY_CARE_PROVIDER_SITE_OTHER): Payer: PRIVATE HEALTH INSURANCE | Admitting: Pharmacist Clinician (PhC)/ Clinical Pharmacy Specialist

## 2013-10-22 DIAGNOSIS — Z952 Presence of prosthetic heart valve: Secondary | ICD-10-CM

## 2013-10-22 DIAGNOSIS — Z7901 Long term (current) use of anticoagulants: Secondary | ICD-10-CM

## 2013-10-22 DIAGNOSIS — Z954 Presence of other heart-valve replacement: Secondary | ICD-10-CM

## 2013-11-25 ENCOUNTER — Ambulatory Visit (INDEPENDENT_AMBULATORY_CARE_PROVIDER_SITE_OTHER): Payer: PRIVATE HEALTH INSURANCE | Admitting: *Deleted

## 2013-11-25 ENCOUNTER — Ambulatory Visit (INDEPENDENT_AMBULATORY_CARE_PROVIDER_SITE_OTHER): Payer: PRIVATE HEALTH INSURANCE | Admitting: Cardiovascular Disease

## 2013-11-25 VITALS — BP 150/62 | HR 52 | Ht 70.0 in | Wt 220.7 lb

## 2013-11-25 DIAGNOSIS — E785 Hyperlipidemia, unspecified: Secondary | ICD-10-CM

## 2013-11-25 DIAGNOSIS — E119 Type 2 diabetes mellitus without complications: Secondary | ICD-10-CM

## 2013-11-25 DIAGNOSIS — Z720 Tobacco use: Secondary | ICD-10-CM

## 2013-11-25 DIAGNOSIS — Z7901 Long term (current) use of anticoagulants: Secondary | ICD-10-CM

## 2013-11-25 DIAGNOSIS — I2510 Atherosclerotic heart disease of native coronary artery without angina pectoris: Secondary | ICD-10-CM

## 2013-11-25 DIAGNOSIS — Z952 Presence of prosthetic heart valve: Secondary | ICD-10-CM

## 2013-11-25 DIAGNOSIS — I739 Peripheral vascular disease, unspecified: Secondary | ICD-10-CM

## 2013-11-25 DIAGNOSIS — I440 Atrioventricular block, first degree: Secondary | ICD-10-CM

## 2013-11-25 DIAGNOSIS — Z954 Presence of other heart-valve replacement: Secondary | ICD-10-CM

## 2013-11-25 DIAGNOSIS — I447 Left bundle-branch block, unspecified: Secondary | ICD-10-CM

## 2013-11-25 DIAGNOSIS — F17200 Nicotine dependence, unspecified, uncomplicated: Secondary | ICD-10-CM

## 2013-11-25 LAB — POCT INR: INR: 3

## 2013-11-25 MED ORDER — RAMIPRIL 2.5 MG PO CAPS: ORAL_CAPSULE | ORAL | Status: DC

## 2013-11-25 NOTE — Patient Instructions (Signed)
Your physician has recommended you make the following change in your medication: increase the ramipril 2.5 mg to two tablets in the morning and 1 in the PM.  Your physician recommends that you return for lab work in: [place] physician recommends that you schedule a follow-up appointment in: 3 months.

## 2013-11-27 ENCOUNTER — Encounter: Payer: Self-pay | Admitting: Cardiovascular Disease

## 2013-11-27 DIAGNOSIS — I440 Atrioventricular block, first degree: Secondary | ICD-10-CM | POA: Insufficient documentation

## 2013-11-27 NOTE — Progress Notes (Signed)
Patient ID: Tanner Pena, male   DOB: 08/03/1941, 73 y.o.   MRN: [id number]    HPI: Tanner Pena is a 73 y.o. male who presents to the office today for Cardiologic followup evaluation  Tanner Pena is a 73 years old WM with coronary artery disease, history of aortic stenosis, as well as peripheral vascular disease. In 1991 he underwent initial CABG revascularization surgery to his RCA and also underwent St. Jude's aortic valve replacement surgery for aortic valve stenosis. In 1994, he required redo CABG surgery to his left coronary system which was not bypassed in 1991.  In 2005, a stent was placed in his RCA vein graft the patient has significant peripheral vascular disease and is status post intervention to both his right iliac and bilateral SFAs with Dr. [name] and has also undergone rotational atherectomy of his SFAs. Additionally, he has evidence for mild to moderate carotid stenosis, a history of hypertension, ongoing tobacco use, type 2 diabetes mellitus, as well as obstructive sleep apnea. A carotid Doppler study done in September 2013 showed moderate amount of fibrous plaque and right carotid internal stenosis of less than 49% both the right external carotid narrowing of 70-99%. 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 His left  internal carotid revealed 50-69% of diameter reduction. Mr Tanner Pena denies recent episodes of chest pain. He continues to smoke cigarettes less than one pack per day and has been smoking approximately 60 years , although he did quit on 3 occasions. He does note shortness of breath with activity. He denies loss of symptoms of claudication.  He was hospitalized on August 09, 2013 for recurrent chest pain symptoms. Cardiac enzymes were negative. He was hypertensive on admission with a blood pressure of 206/117. He was treated with diuretic therapy and  initially his Coumadin was held. A Myoview study was done which showed scar without ischemia and consequently Coumadin was resumed and cardiac catheterization was not done.  sedation was His ejection fraction on his Myoview study in December 2014 was 30%. He does have chronic left bundle branch block. He has COPD. He also has continued claudication in the left lower extremity due to chronic disease not amenable to PTA.   Since I last saw him, a followup echo Doppler study showed an ejection fraction of 40-45% on 10/20/2013.  His St. Jude's mechanical aortic valve was not well visualized.  His peak and mean aortic gradient is were 33 and 19 mm, but there was concern perhaps this may underestimate the severity of his potential stenosis due to his reduced cardiac output.  He did have global hypokinesis with regional variation and grade 1 diastolic dysfunction.  Recent blood work showed a serum glucose of 152 on 10/18/2013.  In normal creatinine at 1.04.  LFTs were normal.  He does admit to mild shortness of breath, but this has not changed significantly.  He does have difficulty with hearing.  He denies chest pressure.  He is unaware of palpitations, presyncope, or syncope.  Past Medical History  Diagnosis Date  . Diabetes mellitus 2007  . Hypertension   . Gout   . Hypercholesteremia   . Peripheral vascular disease     stents lower extremity  . Chronic pain     foot, hips  . Sleep apnea   . COPD (chronic obstructive pulmonary disease)   . S/P aortic valve replacement 1990  St. Jude  . Chronic back pain   . Dysphagia   . Neuromuscular disorder   . Peripheral neuropathy   . Critical lower limb ischemia     Past Surgical History  Procedure Laterality Date  . Back surgery  5974,1638    2  . Open heart surgery  1990    prosthetic heart valve, one bypass  . Rotator cuff repair      right  . Cataract extraction      bilateral  . Coronary stent placement  2005    RCA vein graft A  3.0x13.0 TAXUS stent was then placed int he vessel a Viva 3.0x4.0 (perfusion balloon was made ready it was placed through the entire lenght of the stent  . Peripheral vascular procedures lower extremities      right external iliac  artery PTA and stenting as well as bilateral SFA intervention remotely. Repeat procedures in 2011 bilaterally  . Coronary artery bypass graft  1994    6 vessels  . [name]  06/13/2011    Procedure: Venia Minks DILATION;  Surgeon: [name], MD;  Location: [facility];  Service: Endoscopy;  Laterality: N/A;  Dilated to 56.   . Angioplasty illiac artery      No Known Allergies  Current Outpatient Prescriptions  Medication Sig Dispense Refill  . albuterol (VOSPIRE ER) 4 MG 12 hr tablet 4 mg 2 (two) times daily.      [name] albuterol-ipratropium (COMBIVENT) 18-103 MCG/ACT inhaler Inhale 1 puff into the lungs 4 (four) times daily. Coughing/ Shortness of Breath      . allopurinol (ZYLOPRIM) 300 MG tablet Take 300 mg by mouth at bedtime.       [name] amLODipine (NORVASC) 5 MG tablet Take 5 mg by mouth every morning.       [name] aspirin 81 MG tablet Take 81 mg by mouth at bedtime.       . fenofibrate (TRICOR) 145 MG tablet Take 1 tablet (145 mg total) by mouth daily.  30 tablet  6  . fish oil-omega-3 fatty acids 1000 MG capsule Take 1 capsule (1 g total) by mouth 2 (two) times daily.      . furosemide (LASIX) 40 MG tablet Take 40 mg by mouth daily. 40 mg  Daily additional 40 mg as needed.      [name] glimepiride (AMARYL) 2 MG tablet Take 2 mg by mouth 2 (two) times daily.       . Iodine 2-2.4 % SOLN Apply 1 application topically daily. Applied to affected foot      . isosorbide mononitrate (IMDUR) 30 MG 24 hr tablet Take 1 tablet (30 mg total) by mouth daily.  30 tablet  11  . metoprolol succinate (TOPROL-XL) 25 MG 24 hr tablet Take 3 tablets (75 mg total) by mouth daily.  60 each  0  . oxyCODONE-acetaminophen (PERCOCET) 10-325 MG per tablet Take 1 tablet by mouth every 6 (six) hours  as needed for pain.       . potassium chloride SA (K-DUR,KLOR-CON) 20 MEQ tablet Take 20 mEq by mouth daily.        . pregabalin (LYRICA) 50 MG capsule Take 50 mg by mouth at bedtime.       . rosuvastatin (CRESTOR) 20 MG tablet Take 1 tablet (20 mg total) by mouth at bedtime.  30 tablet  11  . sitaGLIPtin (JANUVIA) 100 MG tablet Take 100 mg by mouth every morning.       . warfarin (COUMADIN)  5 MG tablet Take 1 - 1&1/2 tablets by mouth daily as directed  135 tablet  1  . ramipril (ALTACE) 2.5 MG capsule Take 2 capsules in the AM and 1 capsule in the PM  90 capsule  6   No current facility-administered medications for this visit.    Socially, he is married has 4 children 8 grandchildren. He does not routinely exercise he denies alcohol use. He is smoking one half to less than one pack per day. 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   ROS is negative for fevers chills or night sweats. He denies change in vision.  He does have difficulty with hearing. At times he does note some neck discomfort. He does note shortness of breath with activity. At times he does note some mild wheezing. He denies increased cough. Eyes any further chest pain leading to his recent hospitalization. He is unaware of tachycardia palpitations. He does have a small umbilical hernia he denies bleeding.  He denies worsening claudication.  He does note leg swelling particularly in the left intermittently. He denies recent bleeding episodes. He denies changes in bowel or bladder. He denies nausea vomiting or diarrhea. He does have diabetes. There is no history of hypothyroidism. At times he does note some rare lightheadedness he stands abruptly. He does have hyperlipidemia. Other comprehensive 14 point system review is negative   PE BP 150/62  Pulse 52  Ht 5' 10"  (1.778 m)  Wt 220 lb 11.2 oz (100.109 kg)  BMI 31.67 kg/m2  General: Alert, oriented, no distress.  HEENT: Normocephalic, atraumatic.  Pupils round and reactive; sclera anicteric; no Mouth/Parynx benign; Mallinpatti scale 3 Neck: No JVD, bilateral carotid bruits Lungs: decreased breath sounds; no wheezing or rales Chest wall: No tenderness to palpation Heart: RRR, s1 s2 normal  2/6 SEM with crisp prosthetic valve sounds no S3 gallop. No rub. No heaves Abdomen: soft, nontender; no hepatosplenomehaly, BS+; abdominal aorta nontender and not dilated by palpation. Small umbilical protrusion Back: No CVA tenderness Pulses 2+ upper, slightly diminished lower extremities. Extremities: no clubbinbg cyanosis or edema, Homan's sign negative; L great toe bandaged  Neurologic: grossly nonfocal Psychological: Normal affect and mood  ECG (independently read by me): sinus rhythm with lateral branch block and repolarization changes.  Occasional PVCs.  First degree AV block with a PR interval of 242 ms  Prior to 09/20/2013 ECG (independently read by me): Sinus bradycardia 58 beats per minute. First degree block;  left bundle branch   LABS:      Basic Metabolic Panel: No results found for this basename: NA, K, CL, CO2, GLUCOSE, BUN, CREATININE, CALCIUM, MG, PHOS,  in the last 72 hours Liver Function Tests: No results found for this basename: AST, ALT, ALKPHOS, BILITOT, PROT, ALBUMIN,  in the last 72 hours No results found for this basename: LIPASE, AMYLASE,  in the last 72 hours CBC: No results found for this basename: WBC, NEUTROABS, HGB, HCT, MCV, PLT,  in the last 72 hours Cardiac Enzymes: No results found for this basename: CKTOTAL, CKMB, CKMBINDEX, TROPONINI,  in the last 72 hours BNP: No components found with this basename: POCBNP,  D-Dimer: No results found for this basename: DDIMER,  in the last 72 hours Hemoglobin A1C: No results found for this basename: HGBA1C,  in the last 72 hours Fasting Lipid Panel: No results found for this basename: CHOL, HDL, LDLCALC, TRIG, CHOLHDL, LDLDIRECT,  in the last 72 hours Thyroid  Function  Tests: No results found for this basename: TSH, T4TOTAL, FREET3, T3FREE, THYROIDAB,  in the last 72 hours Anemia Panel: No results found for this basename: VITAMINB12, FOLATE, FERRITIN, TIBC, IRON, RETICCTPCT,  in the last 72 hours  RADIOLOGY: US Arterial Seg Single  12/24/2012   *RADIOLOGY REPORT*  Clinical Data: Diabetes, nonhealing great toe ulcer, diabetes, hypertension  NONINVASIVE PHYSIOLOGIC VASCULAR STUDY OF BILATERAL LOWER EXTREMITIES  Technique:  Evaluation of both lower extremities were performed at rest, including calculation of ankle-brachial indices with single level Doppler, pressure and pulse volume recording.  Comparison:  None.  Findings:  Right ABI: 1.07  Left ABI: 0.68  Right Lower Extremity: Triphasic right tibial wave form and a biphasic right dorsalis pedis wave form.  No significant pressure gradient.  Normal ABI.  Right toe pressure 86.  Left Lower Extremity: Irregular biphasic left tibial tracing and irregular monophasic left dorsalis pedis tracing.  48 mmHg pressure gradient in the left ankle compared to the left brachial pressure. This results in an abnormal ABI measuring 0.68.  Left toe pressure could not be obtained.  IMPRESSION: Abnormal left ABI measuring 0.68.  This is indicative of left lower extremity significant vascular disease.  Normal right ABI.   Original Report Authenticated By: Jerilynn Mages. Shick, M.D.      ASSESSMENT AND PLAN:    Mr. Vu is 24 years status post initial CBG surgery to his right coronary artery at which time he underwent St. Jude aortic valve replacement surgery for  severe aortic valve stenosis the. He is 21 years status post CABG surgery to his left coronary system and 9 years status post stenting to his RCA vein graft. We again discussed importance of complete smoking cessation. Unfortunately he continues to smoke. He is on Coumadin for anticoagulation with his St. Jude mechanical prosthesis Today, his blood pressure was slightly elevated  at 150/62.  I reviewed his most recent echo Doppler study with him in detail.  On his prior echo of 2013.  His mean gradient was 12 in maximum gradient 28 with a St. Jude's valve.  On his current echo these gradients have increased slightly.  Ejection fraction is now felt to be 40-45% as opposed to 45-50%.  I'm recommending slight additional titration of his Ultrase 25 mg in the morning and 2.5 mg at night.  He'll have a followup be met in 2 weeks.  He is on lipid lowering therapy with Crestor 20 mg, fish oil.  He is not having any anginal symptoms and is on low-dose nitrate in addition to his beta blocker treatment.  He is tolerating Coumadin anticoagulation without bleeding.  I again, discussed the absolute importance of complete smoking cessation.  He is not having any change in his chronic claudication.  I will see him in 3 months for cardiology reevaluation for  .Troy Sine 11/27/2013 1:04 PM

## 2013-12-14 LAB — BASIC METABOLIC PANEL
BUN: 23 mg/dL (ref 6–23)
CO2: 27 mEq/L (ref 19–32)
CREATININE: 1.11 mg/dL (ref 0.50–1.35)
Calcium: 9.6 mg/dL (ref 8.4–10.5)
Chloride: 105 mEq/L (ref 96–112)
Glucose, Bld: 124 mg/dL — ABNORMAL HIGH (ref 70–99)
Potassium: 4.3 mEq/L (ref 3.5–5.3)
Sodium: 140 mEq/L (ref 135–145)

## 2013-12-15 ENCOUNTER — Encounter: Payer: Self-pay | Admitting: *Deleted

## 2014-01-19 ENCOUNTER — Emergency Department (HOSPITAL_COMMUNITY)
Admission: EM | Admit: 2014-01-19 | Discharge: 2014-01-19 | Disposition: A | Discharge status: Home / Self Care | Payer: PRIVATE HEALTH INSURANCE | Attending: Emergency Medicine | Admitting: Emergency Medicine

## 2014-01-19 ENCOUNTER — Other Ambulatory Visit: Payer: Self-pay | Admitting: Pharmacist Clinician (PhC)/ Clinical Pharmacy Specialist

## 2014-01-19 ENCOUNTER — Encounter (HOSPITAL_COMMUNITY): Payer: Self-pay | Admitting: Emergency Medicine

## 2014-01-19 ENCOUNTER — Emergency Department (HOSPITAL_COMMUNITY): Payer: PRIVATE HEALTH INSURANCE

## 2014-01-19 DIAGNOSIS — I10 Essential (primary) hypertension: Secondary | ICD-10-CM | POA: Insufficient documentation

## 2014-01-19 DIAGNOSIS — G8929 Other chronic pain: Secondary | ICD-10-CM | POA: Diagnosis not present

## 2014-01-19 DIAGNOSIS — Z951 Presence of aortocoronary bypass graft: Secondary | ICD-10-CM | POA: Diagnosis not present

## 2014-01-19 DIAGNOSIS — Z7982 Long term (current) use of aspirin: Secondary | ICD-10-CM | POA: Diagnosis not present

## 2014-01-19 DIAGNOSIS — Z7901 Long term (current) use of anticoagulants: Secondary | ICD-10-CM | POA: Diagnosis not present

## 2014-01-19 DIAGNOSIS — Z8669 Personal history of other diseases of the nervous system and sense organs: Secondary | ICD-10-CM | POA: Insufficient documentation

## 2014-01-19 DIAGNOSIS — M109 Gout, unspecified: Secondary | ICD-10-CM | POA: Insufficient documentation

## 2014-01-19 DIAGNOSIS — E119 Type 2 diabetes mellitus without complications: Secondary | ICD-10-CM | POA: Diagnosis not present

## 2014-01-19 DIAGNOSIS — Z9861 Coronary angioplasty status: Secondary | ICD-10-CM | POA: Insufficient documentation

## 2014-01-19 DIAGNOSIS — Z9889 Other specified postprocedural states: Secondary | ICD-10-CM | POA: Diagnosis not present

## 2014-01-19 DIAGNOSIS — J441 Chronic obstructive pulmonary disease with (acute) exacerbation: Secondary | ICD-10-CM | POA: Insufficient documentation

## 2014-01-19 DIAGNOSIS — Z79899 Other long term (current) drug therapy: Secondary | ICD-10-CM | POA: Diagnosis not present

## 2014-01-19 DIAGNOSIS — F17200 Nicotine dependence, unspecified, uncomplicated: Secondary | ICD-10-CM | POA: Diagnosis not present

## 2014-01-19 DIAGNOSIS — R0602 Shortness of breath: Secondary | ICD-10-CM | POA: Diagnosis present

## 2014-01-19 DIAGNOSIS — E7800 Pure hypercholesterolemia, unspecified: Secondary | ICD-10-CM | POA: Insufficient documentation

## 2014-01-19 DIAGNOSIS — I509 Heart failure, unspecified: Secondary | ICD-10-CM | POA: Insufficient documentation

## 2014-01-19 DIAGNOSIS — J449 Chronic obstructive pulmonary disease, unspecified: Secondary | ICD-10-CM

## 2014-01-19 LAB — CBC WITH DIFFERENTIAL/PLATELET
Basophils Absolute: 0.1 10*3/uL (ref 0.0–0.1)
Basophils Relative: 1 % (ref 0–1)
Eosinophils Absolute: 0.6 10*3/uL (ref 0.0–0.7)
Eosinophils Relative: 8 % — ABNORMAL HIGH (ref 0–5)
HCT: 42.9 % (ref 39.0–52.0)
HEMOGLOBIN: 14.4 g/dL (ref 13.0–17.0)
LYMPHS ABS: 1.3 10*3/uL (ref 0.7–4.0)
Lymphocytes Relative: 17 % (ref 12–46)
MCH: 30.4 pg (ref 26.0–34.0)
MCHC: 33.6 g/dL (ref 30.0–36.0)
MCV: 90.5 fL (ref 78.0–100.0)
MONOS PCT: 8 % (ref 3–12)
Monocytes Absolute: 0.6 10*3/uL (ref 0.1–1.0)
NEUTROS ABS: 5.2 10*3/uL (ref 1.7–7.7)
NEUTROS PCT: 66 % (ref 43–77)
Platelets: 198 10*3/uL (ref 150–400)
RBC: 4.74 MIL/uL (ref 4.22–5.81)
RDW: 14.8 % (ref 11.5–15.5)
WBC: 7.8 10*3/uL (ref 4.0–10.5)

## 2014-01-19 LAB — COMPREHENSIVE METABOLIC PANEL
ALBUMIN: 3.9 g/dL (ref 3.5–5.2)
ALT: 17 U/L (ref 0–53)
AST: 24 U/L (ref 0–37)
Alkaline Phosphatase: 37 U/L — ABNORMAL LOW (ref 39–117)
BUN: 14 mg/dL (ref 6–23)
CO2: 27 mEq/L (ref 19–32)
Calcium: 9.9 mg/dL (ref 8.4–10.5)
Chloride: 102 mEq/L (ref 96–112)
Creatinine, Ser: 0.98 mg/dL (ref 0.50–1.35)
GFR calc Af Amer: >90 mL/min (ref >90)
GFR calc non Af Amer: 80 mL/min — ABNORMAL LOW (ref >90)
GLUCOSE: 119 mg/dL — AB (ref 70–99)
POTASSIUM: 4.5 meq/L (ref 3.7–5.3)
Sodium: 141 mEq/L (ref 137–147)
Total Bilirubin: 0.5 mg/dL (ref 0.3–1.2)
Total Protein: 6.9 g/dL (ref 6.0–8.3)

## 2014-01-19 LAB — D-DIMER, QUANTITATIVE: D-Dimer, Quant: <0.27 ug/mL-FEU (ref 0.00–0.48)

## 2014-01-19 LAB — PRO B NATRIURETIC PEPTIDE: Pro B Natriuretic peptide (BNP): 1373 pg/mL — ABNORMAL HIGH (ref 0–125)

## 2014-01-19 LAB — PROTIME-INR
INR: 2.92 — AB (ref <1.50)
PROTHROMBIN TIME: 29.7 s — AB (ref 11.6–15.2)

## 2014-01-19 LAB — TROPONIN I: Troponin I: <0.3 ng/mL (ref <0.30)

## 2014-01-19 MED ORDER — FUROSEMIDE 10 MG/ML IJ SOLN
80.0000 mg | Freq: Once | INTRAMUSCULAR | Status: AC
  Administered 2014-01-19: 80 mg via INTRAVENOUS
  Filled 2014-01-19: qty 8

## 2014-01-19 NOTE — Discharge Instructions (Signed)
Follow up with your md in 2 days for recheck 

## 2014-01-19 NOTE — ED Notes (Signed)
Pt has c/o of chest pain and SOB brought in by EMS.

## 2014-01-19 NOTE — ED Provider Notes (Signed)
CSN: [id number]     Arrival date & time 01/19/14  2007 History   First MD Initiated Contact with Patient 01/19/14 2013  This chart was scribed for Maudry Diego, MD by Anastasia Pall, ED Scribe. This patient was seen in room [facility] and the patient's care was started at 8:19 PM.    Chief Complaint  Patient presents with  . Shortness of Breath   (Consider location/radiation/quality/duration/timing/severity/associated sxs/prior Treatment) Patient is a 73 y.o. male presenting with shortness of breath and chest pain. The history is provided by the patient. No language interpreter was used.  Shortness of Breath Associated symptoms: chest pain   Associated symptoms: no abdominal pain, no cough, no headaches and no rash   Chest Pain Pain location:  Substernal area Pain severity:  Severe Onset quality:  Sudden Duration: 1-2 hours ago. Progression:  Resolved Chronicity:  Recurrent Context: at rest   Associated symptoms: shortness of breath   Associated symptoms: no abdominal pain, no back pain, no cough, no fatigue and no headache   Risk factors comment:  PVD, COPD  HPI Comments: Tanner Pena is a 73 y.o. male with h/o PVD, COPD, and S/P aortic valve replacement brought in by EMS, who presents to the Emergency Department complaining of acute onset of chest pain with SOB, onset earlier today while sitting in his truck. He states his pain gradually worsened and that it was severe by the time he got home, so he came to the ED. He denies current chest pain in ED. He reports h/o fluid in his lungs, states he can feel the fluid in his lungs again. He has swelling in his left LE. He denies any other associated symptoms.   PCP - [name], MD  Past Medical History  Diagnosis Date  . Diabetes mellitus 2007  . Hypertension   . Gout   . Hypercholesteremia   . Peripheral vascular disease     stents lower extremity  . Chronic pain     foot, hips  . Sleep apnea   . COPD (chronic  obstructive pulmonary disease)   . 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 Chronic back pain   . Dysphagia   . Neuromuscular disorder   . Peripheral neuropathy   . Critical lower limb ischemia    Past Surgical History  Procedure Laterality Date  . Back surgery  0867,[id number]    2  . Open heart surgery  1990    prosthetic heart valve, one bypass  . Rotator cuff repair      right  . Cataract extraction      bilateral  . Coronary stent placement  2005    RCA vein graft A 3.0x13.0 TAXUS stent was then placed int he vessel a Viva 3.0x4.0 (perfusion balloon was made ready it was placed through the entire lenght of the stent  . Peripheral vascular procedures lower extremities      right external iliac  artery PTA and stenting as well as bilateral SFA intervention remotely. Repeat procedures in 2011 bilaterally  . Coronary artery bypass graft  1994    6 vessels  . [name]  06/13/2011    Procedure: Venia Minks DILATION;  Surgeon: [name], MD;  Location: [facility];  Service: Endoscopy;  Laterality: N/A;  Dilated to 56.   . Angioplasty illiac artery     Family History  Problem Relation Age of Onset  . Colon cancer Neg Hx   . Liver disease  Neg Hx    History  Substance Use Topics  . Smoking status: Current Every Day Smoker -- 1.00 packs/day for 55 years    Types: Cigarettes    Start date: 08/19/1953  . Smokeless tobacco: Former Systems developer    Types: [place] date: 08/20/1995     Comment: Has quit on 3 occasions. Counseling given today 5-10 minutes   I am more than likely going to quit "  . Alcohol Use: Yes     Comment: socially, sometimes 12 ounce beer daily, may go month without/no whiskey    Review of Systems  Constitutional: Negative for appetite change and fatigue.  HENT: Negative for congestion, ear discharge and sinus pressure.   Eyes: Negative for discharge.  Respiratory: Positive for shortness of breath. Negative for cough.   Cardiovascular: Positive for  chest pain.  Gastrointestinal: Negative for abdominal pain and diarrhea.  Genitourinary: Negative for frequency and hematuria.  Musculoskeletal: Negative for back pain.  Skin: Negative for rash.  Neurological: Negative for seizures and headaches.  Psychiatric/Behavioral: Negative for hallucinations.   Allergies  Review of patient's allergies indicates no known allergies.  Home Medications   Prior to Admission medications   Medication Sig Start Date End Date Taking? Authorizing Provider  albuterol (VOSPIRE ER) 4 MG 12 hr tablet Take 4 mg by mouth 2 (two) times daily.    Yes Historical Provider, MD  albuterol-ipratropium (COMBIVENT) 18-103 MCG/ACT inhaler Inhale 1 puff into the lungs 4 (four) times daily. Coughing/ Shortness of Breath   Yes Historical Provider, MD  allopurinol (ZYLOPRIM) 300 MG tablet Take 300 mg by mouth at bedtime.    Yes Historical Provider, MD  amLODipine (NORVASC) 5 MG tablet Take 5 mg by mouth every morning.    Yes Historical Provider, MD  aspirin EC 81 MG tablet Take 81 mg by mouth at bedtime.   Yes Historical Provider, MD  fenofibrate (TRICOR) 145 MG tablet Take 1 tablet (145 mg total) by mouth daily. 07/19/13  Yes [name], MD  fish oil-omega-3 fatty acids 1000 MG capsule Take 1 capsule (1 g total) by mouth 2 (two) times daily. 01/22/13  Yes Tommy Medal, RPH-CPP  furosemide (LASIX) 80 MG tablet Take 40 mg by mouth daily.   Yes Historical Provider, MD  glimepiride (AMARYL) 2 MG tablet Take 2 mg by mouth 2 (two) times daily.    Yes Historical Provider, MD  isosorbide mononitrate (IMDUR) 30 MG 24 hr tablet Take 1 tablet (30 mg total) by mouth daily. 09/02/13  Yes Troy Sine, MD  metoprolol succinate (TOPROL-XL) 25 MG 24 hr tablet Take 3 tablets (75 mg total) by mouth daily. 08/16/13  Yes Velvet Bathe, MD  oxyCODONE-acetaminophen (PERCOCET) 10-325 MG per tablet Take 1 tablet by mouth every 6 (six) hours as needed for pain.  01/19/13  Yes Historical Provider, MD   potassium chloride SA (K-DUR,KLOR-CON) 20 MEQ tablet Take 20 mEq by mouth daily.     Yes Historical Provider, MD  pregabalin (LYRICA) 50 MG capsule Take 50 mg by mouth at bedtime.    Yes Historical Provider, MD  ramipril (ALTACE) 2.5 MG capsule Take 2.5 mg by mouth 2 (two) times daily.   Yes Historical Provider, MD  rosuvastatin (CRESTOR) 20 MG tablet Take 1 tablet (20 mg total) by mouth at bedtime. 04/23/13  Yes [name], MD  sitaGLIPtin (JANUVIA) 50 MG tablet Take 50 mg by mouth daily.   Yes Historical Provider, MD  warfarin (COUMADIN) 5  MG tablet Take 5-7.5 mg by mouth daily. Patient takes 5mg  daily except on Monday and Friday patient takes 7.5mg    Yes Historical Provider, MD   BP 180/89  Pulse 115  Temp(Src) 98.4 F (36.9 C) (Oral)  Resp 29  Ht 5\' 10"  (1.778 m)  Wt 212 lb (96.163 kg)  BMI 30.42 kg/m2  SpO2 99% Physical Exam  Constitutional: He is oriented to person, place, and time. He appears well-developed.  HENT:  Head: Normocephalic.  Eyes: Conjunctivae and EOM are normal. No scleral icterus.  Neck: Neck supple. No thyromegaly present.  Cardiovascular: Normal rate and regular rhythm.  Exam reveals no gallop and no friction rub.   No murmur heard. Ball valve aortic  Pulmonary/Chest: No stridor. He has wheezes (minimal wheezing bilaterally). He has no rales. He exhibits no tenderness.  Abdominal: He exhibits no distension. There is no tenderness. There is no rebound.  Musculoskeletal: Normal range of motion. He exhibits no edema.  Lymphadenopathy:    He has no cervical adenopathy.  Neurological: He is oriented to person, place, and time. He exhibits normal muscle tone. Coordination normal.  Skin: No rash noted. No erythema.  Psychiatric: He has a normal mood and affect. His behavior is normal.   ED Course  Procedures (including critical care time)  DIAGNOSTIC STUDIES: Oxygen Saturation is 99% on room air, normal by my interpretation.    COORDINATION OF  CARE: 8:23 PM-Discussed treatment plan with pt at bedside and pt agreed to plan.   Results for orders placed during the hospital encounter of 01/19/14  CBC WITH DIFFERENTIAL      Result Value Ref Range   WBC 7.8  4.0 - 10.5 K/uL   RBC 4.74  4.22 - 5.81 MIL/uL   Hemoglobin 14.4  13.0 - 17.0 g/dL   HCT 42.9  39.0 - 52.0 %   MCV 90.5  78.0 - 100.0 fL   MCH 30.4  26.0 - 34.0 pg   MCHC 33.6  30.0 - 36.0 g/dL   RDW 14.8  11.5 - 15.5 %   Platelets 198  150 - 400 K/uL   Neutrophils Relative % 66  43 - 77 %   Neutro Abs 5.2  1.7 - 7.7 K/uL   Lymphocytes Relative 17  12 - 46 %   Lymphs Abs 1.3  0.7 - 4.0 K/uL   Monocytes Relative 8  3 - 12 %   Monocytes Absolute 0.6  0.1 - 1.0 K/uL   Eosinophils Relative 8 (*) 0 - 5 %   Eosinophils Absolute 0.6  0.0 - 0.7 K/uL   Basophils Relative 1  0 - 1 %   Basophils Absolute 0.1  0.0 - 0.1 K/uL  COMPREHENSIVE METABOLIC PANEL      Result Value Ref Range   Sodium 141  137 - 147 mEq/L   Potassium 4.5  3.7 - 5.3 mEq/L   Chloride 102  96 - 112 mEq/L   CO2 27  19 - 32 mEq/L   Glucose, Bld 119 (*) 70 - 99 mg/dL   BUN 14  6 - 23 mg/dL   Creatinine, Ser 0.98  0.50 - 1.35 mg/dL   Calcium 9.9  8.4 - 10.5 mg/dL   Total Protein 6.9  6.0 - 8.3 g/dL   Albumin 3.9  3.5 - 5.2 g/dL   AST 24  0 - 37 U/L   ALT 17  0 - 53 U/L   Alkaline Phosphatase 37 (*) 39 - 117 U/L   Total  Bilirubin 0.5  0.3 - 1.2 mg/dL   GFR calc non Af Amer 80 (*) >90 mL/min   GFR calc Af Amer >90  >90 mL/min  TROPONIN I      Result Value Ref Range   Troponin I <0.30  <0.30 ng/mL  PRO B NATRIURETIC PEPTIDE      Result Value Ref Range   Pro B Natriuretic peptide (BNP) 1373.0 (*) 0 - 125 pg/mL  D-DIMER, QUANTITATIVE      Result Value Ref Range   D-Dimer, Quant <0.27  0.00 - 0.48 ug/mL-FEU   Dg Chest Portable 1 View  01/19/2014   CLINICAL DATA:  Chest pain and shortness of breath.  EXAM: PORTABLE CHEST - 1 VIEW  COMPARISON:  08/10/2013  FINDINGS: Stable mild cardiomegaly. Stable COPD.  There is no evidence of pulmonary edema, consolidation, pneumothorax, nodule or pleural fluid.  IMPRESSION: Stable COPD and cardiomegaly.  No acute findings.   Electronically Signed   By: Aletta Edouard M.D.   On: 01/19/2014 20:45    EKG Interpretation   Date/Time:  Wednesday January 19 2014 20:12:11 EDT Ventricular Rate:  103 PR Interval:  152 QRS Duration: 149 QT Interval:  381 QTC Calculation: 499 R Axis:   36 Text Interpretation:  Sinus or ectopic atrial tachycardia Left bundle  branch block Confirmed by Wynn Kernes  MD, Broadus John [phone]) on 01/19/2014 10:07:46  PM Also confirmed by Cowen Pesqueira  MD, Broadus John [phone])  on 01/19/2014 10:56:56 PM     Medications - No data to display MDM   Final diagnoses:  None    Sob from copd and chf.  Pt did not take his 80 mg of lasix today or any of his meds.  He was stable at discharge The chart was scribed for me under my direct supervision.  I personally performed the history, physical, and medical decision making and all procedures in the evaluation of this patient.Maudry Diego, MD 01/19/14 [phone]

## 2014-01-20 ENCOUNTER — Ambulatory Visit (INDEPENDENT_AMBULATORY_CARE_PROVIDER_SITE_OTHER): Payer: PRIVATE HEALTH INSURANCE | Admitting: Pharmacist Clinician (PhC)/ Clinical Pharmacy Specialist

## 2014-01-20 DIAGNOSIS — Z952 Presence of prosthetic heart valve: Secondary | ICD-10-CM

## 2014-01-20 DIAGNOSIS — Z7901 Long term (current) use of anticoagulants: Secondary | ICD-10-CM

## 2014-01-20 DIAGNOSIS — Z954 Presence of other heart-valve replacement: Secondary | ICD-10-CM

## 2014-02-11 ENCOUNTER — Other Ambulatory Visit: Payer: Self-pay | Admitting: Pharmacist Clinician (PhC)/ Clinical Pharmacy Specialist

## 2014-02-11 ENCOUNTER — Telehealth: Payer: Self-pay | Admitting: Cardiovascular Disease

## 2014-02-11 ENCOUNTER — Ambulatory Visit (INDEPENDENT_AMBULATORY_CARE_PROVIDER_SITE_OTHER): Payer: PRIVATE HEALTH INSURANCE | Admitting: Pharmacist Clinician (PhC)/ Clinical Pharmacy Specialist

## 2014-02-11 DIAGNOSIS — Z7901 Long term (current) use of anticoagulants: Secondary | ICD-10-CM

## 2014-02-11 DIAGNOSIS — Z954 Presence of other heart-valve replacement: Secondary | ICD-10-CM

## 2014-02-11 DIAGNOSIS — Z952 Presence of prosthetic heart valve: Secondary | ICD-10-CM

## 2014-02-11 LAB — PROTIME-INR
INR: 2.58 — AB (ref <1.50)
Prothrombin Time: 27.7 seconds — ABNORMAL HIGH (ref 11.6–15.2)

## 2014-02-11 NOTE — Telephone Encounter (Signed)
Myriam Jacobson called requesting order for patient to get INR. She needs order because the patient is there to have drawn 1 week early. Phone call was deferred to [employer]

## 2014-02-24 ENCOUNTER — Other Ambulatory Visit: Payer: Self-pay | Admitting: *Deleted

## 2014-02-24 MED ORDER — FENOFIBRATE 145 MG PO TABS: 145.0000 mg | ORAL_TABLET | Freq: Every day | ORAL | Status: DC

## 2014-02-24 NOTE — Telephone Encounter (Signed)
Rx was sent to pharmacy electronically. 

## 2014-03-08 ENCOUNTER — Other Ambulatory Visit: Payer: Self-pay | Admitting: Pharmacist Clinician (PhC)/ Clinical Pharmacy Specialist

## 2014-03-08 MED ORDER — WARFARIN SODIUM 5 MG PO TABS: ORAL_TABLET | ORAL | Status: DC

## 2014-04-01 ENCOUNTER — Other Ambulatory Visit: Payer: Self-pay | Admitting: Pharmacist Clinician (PhC)/ Clinical Pharmacy Specialist

## 2014-04-01 ENCOUNTER — Ambulatory Visit (INDEPENDENT_AMBULATORY_CARE_PROVIDER_SITE_OTHER): Payer: PRIVATE HEALTH INSURANCE | Admitting: Pharmacist Clinician (PhC)/ Clinical Pharmacy Specialist

## 2014-04-01 DIAGNOSIS — Z954 Presence of other heart-valve replacement: Secondary | ICD-10-CM

## 2014-04-01 DIAGNOSIS — Z952 Presence of prosthetic heart valve: Secondary | ICD-10-CM

## 2014-04-01 DIAGNOSIS — Z7901 Long term (current) use of anticoagulants: Secondary | ICD-10-CM

## 2014-04-01 LAB — PROTIME-INR
INR: 3.6 — ABNORMAL HIGH
Prothrombin Time: 35.9 s — ABNORMAL HIGH (ref 11.6–15.2)

## 2014-05-04 ENCOUNTER — Other Ambulatory Visit: Payer: Self-pay | Admitting: Cardiovascular Disease

## 2014-05-04 NOTE — Telephone Encounter (Signed)
Rx was sent to pharmacy electronically. 

## 2014-05-07 ENCOUNTER — Emergency Department (HOSPITAL_COMMUNITY): Payer: PRIVATE HEALTH INSURANCE

## 2014-05-07 ENCOUNTER — Inpatient Hospital Stay (HOSPITAL_COMMUNITY)
Admission: EM | Admit: 2014-05-07 | Discharge: 2014-05-17 | DRG: 246 | Disposition: A | Discharge status: Home / Self Care | Payer: PRIVATE HEALTH INSURANCE | Attending: Cardiology | Admitting: Cardiology

## 2014-05-07 ENCOUNTER — Encounter (HOSPITAL_COMMUNITY): Payer: Self-pay | Admitting: Emergency Medicine

## 2014-05-07 DIAGNOSIS — I6529 Occlusion and stenosis of unspecified carotid artery: Secondary | ICD-10-CM | POA: Diagnosis present

## 2014-05-07 DIAGNOSIS — D6832 Hemorrhagic disorder due to extrinsic circulating anticoagulants: Secondary | ICD-10-CM | POA: Diagnosis present

## 2014-05-07 DIAGNOSIS — I255 Ischemic cardiomyopathy: Secondary | ICD-10-CM

## 2014-05-07 DIAGNOSIS — E669 Obesity, unspecified: Secondary | ICD-10-CM | POA: Diagnosis present

## 2014-05-07 DIAGNOSIS — Z7982 Long term (current) use of aspirin: Secondary | ICD-10-CM

## 2014-05-07 DIAGNOSIS — E118 Type 2 diabetes mellitus with unspecified complications: Secondary | ICD-10-CM

## 2014-05-07 DIAGNOSIS — F17200 Nicotine dependence, unspecified, uncomplicated: Secondary | ICD-10-CM | POA: Diagnosis present

## 2014-05-07 DIAGNOSIS — I9589 Other hypotension: Secondary | ICD-10-CM | POA: Diagnosis not present

## 2014-05-07 DIAGNOSIS — R918 Other nonspecific abnormal finding of lung field: Secondary | ICD-10-CM

## 2014-05-07 DIAGNOSIS — R791 Abnormal coagulation profile: Secondary | ICD-10-CM | POA: Diagnosis present

## 2014-05-07 DIAGNOSIS — R079 Chest pain, unspecified: Secondary | ICD-10-CM

## 2014-05-07 DIAGNOSIS — J690 Pneumonitis due to inhalation of food and vomit: Secondary | ICD-10-CM | POA: Diagnosis not present

## 2014-05-07 DIAGNOSIS — Z952 Presence of prosthetic heart valve: Secondary | ICD-10-CM

## 2014-05-07 DIAGNOSIS — Z79899 Other long term (current) drug therapy: Secondary | ICD-10-CM

## 2014-05-07 DIAGNOSIS — L97519 Non-pressure chronic ulcer of other part of right foot with unspecified severity: Secondary | ICD-10-CM

## 2014-05-07 DIAGNOSIS — G8929 Other chronic pain: Secondary | ICD-10-CM | POA: Diagnosis present

## 2014-05-07 DIAGNOSIS — I25700 Atherosclerosis of coronary artery bypass graft(s), unspecified, with unstable angina pectoris: Secondary | ICD-10-CM

## 2014-05-07 DIAGNOSIS — I25709 Atherosclerosis of coronary artery bypass graft(s), unspecified, with unspecified angina pectoris: Secondary | ICD-10-CM

## 2014-05-07 DIAGNOSIS — R0789 Other chest pain: Secondary | ICD-10-CM

## 2014-05-07 DIAGNOSIS — J9601 Acute respiratory failure with hypoxia: Secondary | ICD-10-CM

## 2014-05-07 DIAGNOSIS — I2582 Chronic total occlusion of coronary artery: Secondary | ICD-10-CM | POA: Diagnosis present

## 2014-05-07 DIAGNOSIS — I25110 Atherosclerotic heart disease of native coronary artery with unstable angina pectoris: Secondary | ICD-10-CM

## 2014-05-07 DIAGNOSIS — M109 Gout, unspecified: Secondary | ICD-10-CM | POA: Diagnosis present

## 2014-05-07 DIAGNOSIS — J4489 Other specified chronic obstructive pulmonary disease: Secondary | ICD-10-CM | POA: Diagnosis present

## 2014-05-07 DIAGNOSIS — E8779 Other fluid overload: Secondary | ICD-10-CM | POA: Diagnosis not present

## 2014-05-07 DIAGNOSIS — I10 Essential (primary) hypertension: Secondary | ICD-10-CM

## 2014-05-07 DIAGNOSIS — R339 Retention of urine, unspecified: Secondary | ICD-10-CM | POA: Diagnosis not present

## 2014-05-07 DIAGNOSIS — J438 Other emphysema: Secondary | ICD-10-CM

## 2014-05-07 DIAGNOSIS — G4733 Obstructive sleep apnea (adult) (pediatric): Secondary | ICD-10-CM | POA: Diagnosis present

## 2014-05-07 DIAGNOSIS — Z9849 Cataract extraction status, unspecified eye: Secondary | ICD-10-CM

## 2014-05-07 DIAGNOSIS — T82897A Other specified complication of cardiac prosthetic devices, implants and grafts, initial encounter: Principal | ICD-10-CM | POA: Diagnosis present

## 2014-05-07 DIAGNOSIS — E1159 Type 2 diabetes mellitus with other circulatory complications: Secondary | ICD-10-CM

## 2014-05-07 DIAGNOSIS — M549 Dorsalgia, unspecified: Secondary | ICD-10-CM | POA: Diagnosis present

## 2014-05-07 DIAGNOSIS — I798 Other disorders of arteries, arterioles and capillaries in diseases classified elsewhere: Secondary | ICD-10-CM | POA: Diagnosis present

## 2014-05-07 DIAGNOSIS — I447 Left bundle-branch block, unspecified: Secondary | ICD-10-CM

## 2014-05-07 DIAGNOSIS — J9600 Acute respiratory failure, unspecified whether with hypoxia or hypercapnia: Secondary | ICD-10-CM | POA: Diagnosis not present

## 2014-05-07 DIAGNOSIS — L97509 Non-pressure chronic ulcer of other part of unspecified foot with unspecified severity: Secondary | ICD-10-CM | POA: Diagnosis present

## 2014-05-07 DIAGNOSIS — I498 Other specified cardiac arrhythmias: Secondary | ICD-10-CM | POA: Diagnosis present

## 2014-05-07 DIAGNOSIS — T45515A Adverse effect of anticoagulants, initial encounter: Secondary | ICD-10-CM | POA: Diagnosis present

## 2014-05-07 DIAGNOSIS — E785 Hyperlipidemia, unspecified: Secondary | ICD-10-CM | POA: Diagnosis present

## 2014-05-07 DIAGNOSIS — I2589 Other forms of chronic ischemic heart disease: Secondary | ICD-10-CM | POA: Diagnosis present

## 2014-05-07 DIAGNOSIS — I739 Peripheral vascular disease, unspecified: Secondary | ICD-10-CM

## 2014-05-07 DIAGNOSIS — J449 Chronic obstructive pulmonary disease, unspecified: Secondary | ICD-10-CM | POA: Diagnosis present

## 2014-05-07 DIAGNOSIS — I200 Unstable angina: Secondary | ICD-10-CM

## 2014-05-07 DIAGNOSIS — Z720 Tobacco use: Secondary | ICD-10-CM

## 2014-05-07 DIAGNOSIS — Z7901 Long term (current) use of anticoagulants: Secondary | ICD-10-CM

## 2014-05-07 DIAGNOSIS — G609 Hereditary and idiopathic neuropathy, unspecified: Secondary | ICD-10-CM | POA: Diagnosis present

## 2014-05-07 DIAGNOSIS — I5043 Acute on chronic combined systolic (congestive) and diastolic (congestive) heart failure: Secondary | ICD-10-CM

## 2014-05-07 DIAGNOSIS — I2510 Atherosclerotic heart disease of native coronary artery without angina pectoris: Secondary | ICD-10-CM

## 2014-05-07 DIAGNOSIS — Z954 Presence of other heart-valve replacement: Secondary | ICD-10-CM

## 2014-05-07 DIAGNOSIS — Y849 Medical procedure, unspecified as the cause of abnormal reaction of the patient, or of later complication, without mention of misadventure at the time of the procedure: Secondary | ICD-10-CM | POA: Diagnosis present

## 2014-05-07 DIAGNOSIS — E119 Type 2 diabetes mellitus without complications: Secondary | ICD-10-CM | POA: Diagnosis present

## 2014-05-07 DIAGNOSIS — Z6831 Body mass index (BMI) 31.0-31.9, adult: Secondary | ICD-10-CM

## 2014-05-07 DIAGNOSIS — I442 Atrioventricular block, complete: Secondary | ICD-10-CM | POA: Diagnosis not present

## 2014-05-07 DIAGNOSIS — I493 Ventricular premature depolarization: Secondary | ICD-10-CM

## 2014-05-07 HISTORY — DX: Non-pressure chronic ulcer of other part of unspecified foot with unspecified severity: L97.509

## 2014-05-07 HISTORY — DX: Atherosclerotic heart disease of native coronary artery without angina pectoris: I25.10

## 2014-05-07 LAB — COMPREHENSIVE METABOLIC PANEL
ALBUMIN: 4.4 g/dL (ref 3.5–5.2)
ALT: 17 U/L (ref 0–53)
AST: 31 U/L (ref 0–37)
Alkaline Phosphatase: 32 U/L — ABNORMAL LOW (ref 39–117)
Anion gap: 16 — ABNORMAL HIGH (ref 5–15)
BUN: 23 mg/dL (ref 6–23)
CO2: 24 mEq/L (ref 19–32)
Calcium: 10.2 mg/dL (ref 8.4–10.5)
Chloride: 101 mEq/L (ref 96–112)
Creatinine, Ser: 1.12 mg/dL (ref 0.50–1.35)
GFR calc non Af Amer: 63 mL/min — ABNORMAL LOW (ref >90)
GFR, EST AFRICAN AMERICAN: 73 mL/min — AB (ref >90)
Glucose, Bld: 168 mg/dL — ABNORMAL HIGH (ref 70–99)
POTASSIUM: 4.1 meq/L (ref 3.7–5.3)
Sodium: 141 mEq/L (ref 137–147)
TOTAL PROTEIN: 7.9 g/dL (ref 6.0–8.3)
Total Bilirubin: 0.6 mg/dL (ref 0.3–1.2)

## 2014-05-07 LAB — CBC WITH DIFFERENTIAL/PLATELET
BASOS PCT: 1 % (ref 0–1)
Basophils Absolute: 0.1 10*3/uL (ref 0.0–0.1)
EOS ABS: 0.6 10*3/uL (ref 0.0–0.7)
EOS PCT: 7 % — AB (ref 0–5)
HCT: 43.2 % (ref 39.0–52.0)
Hemoglobin: 14.4 g/dL (ref 13.0–17.0)
Lymphocytes Relative: 27 % (ref 12–46)
Lymphs Abs: 2.4 10*3/uL (ref 0.7–4.0)
MCH: 30.3 pg (ref 26.0–34.0)
MCHC: 33.3 g/dL (ref 30.0–36.0)
MCV: 90.9 fL (ref 78.0–100.0)
Monocytes Absolute: 0.7 10*3/uL (ref 0.1–1.0)
Monocytes Relative: 8 % (ref 3–12)
NEUTROS PCT: 57 % (ref 43–77)
Neutro Abs: 5.3 10*3/uL (ref 1.7–7.7)
PLATELETS: 256 10*3/uL (ref 150–400)
RBC: 4.75 MIL/uL (ref 4.22–5.81)
RDW: 15 % (ref 11.5–15.5)
WBC: 9.2 10*3/uL (ref 4.0–10.5)

## 2014-05-07 LAB — TROPONIN I: Troponin I: <0.3 ng/mL (ref <0.30)

## 2014-05-07 MED ORDER — PREGABALIN 25 MG PO CAPS
50.0000 mg | ORAL_CAPSULE | Freq: Every day | ORAL | Status: DC
  Administered 2014-05-07 – 2014-05-16 (×10): 50 mg via ORAL
  Filled 2014-05-07 (×2): qty 2
  Filled 2014-05-07 (×2): qty 1
  Filled 2014-05-07: qty 2
  Filled 2014-05-07 (×4): qty 1
  Filled 2014-05-07: qty 2

## 2014-05-07 MED ORDER — ASPIRIN EC 81 MG PO TBEC
81.0000 mg | DELAYED_RELEASE_TABLET | Freq: Every day | ORAL | Status: DC
  Administered 2014-05-07 – 2014-05-10 (×4): 81 mg via ORAL
  Filled 2014-05-07 (×5): qty 1

## 2014-05-07 MED ORDER — POTASSIUM CHLORIDE CRYS ER 20 MEQ PO TBCR
20.0000 meq | EXTENDED_RELEASE_TABLET | Freq: Every day | ORAL | Status: DC
  Administered 2014-05-08 – 2014-05-11 (×4): 20 meq via ORAL
  Filled 2014-05-07 (×5): qty 1

## 2014-05-07 MED ORDER — IPRATROPIUM-ALBUTEROL 0.5-2.5 (3) MG/3ML IN SOLN
3.0000 mL | Freq: Four times a day (QID) | RESPIRATORY_TRACT | Status: DC
  Administered 2014-05-08 (×2): 3 mL via RESPIRATORY_TRACT
  Filled 2014-05-07 (×2): qty 3

## 2014-05-07 MED ORDER — WARFARIN SODIUM 5 MG PO TABS: 5.0000 mg | ORAL_TABLET | ORAL | Status: DC

## 2014-05-07 MED ORDER — RAMIPRIL 1.25 MG PO CAPS
2.5000 mg | ORAL_CAPSULE | Freq: Two times a day (BID) | ORAL | Status: DC
  Administered 2014-05-07 – 2014-05-08 (×3): 2.5 mg via ORAL
  Filled 2014-05-07 (×5): qty 2

## 2014-05-07 MED ORDER — SODIUM CHLORIDE 0.9 % IV SOLN
INTRAVENOUS | Status: DC
  Administered 2014-05-07: 21:00:00 via INTRAVENOUS

## 2014-05-07 MED ORDER — ONDANSETRON HCL 4 MG/2ML IJ SOLN: 4.0000 mg | Freq: Four times a day (QID) | INTRAMUSCULAR | Status: DC | PRN

## 2014-05-07 MED ORDER — ISOSORBIDE MONONITRATE ER 30 MG PO TB24: 30.0000 mg | ORAL_TABLET | Freq: Every day | ORAL | Status: DC

## 2014-05-07 MED ORDER — ATORVASTATIN CALCIUM 20 MG PO TABS
20.0000 mg | ORAL_TABLET | Freq: Every day | ORAL | Status: DC
  Administered 2014-05-08: 20 mg via ORAL
  Filled 2014-05-07: qty 1

## 2014-05-07 MED ORDER — ALLOPURINOL 300 MG PO TABS
300.0000 mg | ORAL_TABLET | Freq: Every day | ORAL | Status: DC
  Administered 2014-05-07 – 2014-05-16 (×10): 300 mg via ORAL
  Filled 2014-05-07 (×11): qty 1

## 2014-05-07 MED ORDER — OXYCODONE HCL 5 MG PO TABS
5.0000 mg | ORAL_TABLET | Freq: Four times a day (QID) | ORAL | Status: DC | PRN
  Administered 2014-05-08 – 2014-05-10 (×5): 5 mg via ORAL
  Filled 2014-05-07 (×8): qty 1

## 2014-05-07 MED ORDER — METOPROLOL SUCCINATE ER 25 MG PO TB24
25.0000 mg | ORAL_TABLET | Freq: Two times a day (BID) | ORAL | Status: DC
  Administered 2014-05-07 – 2014-05-09 (×4): 25 mg via ORAL
  Filled 2014-05-07 (×5): qty 1

## 2014-05-07 MED ORDER — GI COCKTAIL ~~LOC~~
30.0000 mL | Freq: Four times a day (QID) | ORAL | Status: DC | PRN
  Filled 2014-05-07: qty 30

## 2014-05-07 MED ORDER — ACETAMINOPHEN 325 MG PO TABS: 650.0000 mg | ORAL_TABLET | ORAL | Status: DC | PRN

## 2014-05-07 MED ORDER — GLIMEPIRIDE 2 MG PO TABS
2.0000 mg | ORAL_TABLET | Freq: Two times a day (BID) | ORAL | Status: DC
  Administered 2014-05-08: 2 mg via ORAL
  Filled 2014-05-07: qty 1

## 2014-05-07 MED ORDER — FUROSEMIDE 40 MG PO TABS
40.0000 mg | ORAL_TABLET | Freq: Every day | ORAL | Status: DC
  Administered 2014-05-08 – 2014-05-10 (×3): 40 mg via ORAL
  Filled 2014-05-07 (×4): qty 1

## 2014-05-07 MED ORDER — ALBUTEROL SULFATE ER 4 MG PO TB12
4.0000 mg | ORAL_TABLET | Freq: Two times a day (BID) | ORAL | Status: DC
  Administered 2014-05-08: 4 mg via ORAL
  Filled 2014-05-07 (×3): qty 1

## 2014-05-07 MED ORDER — AMLODIPINE BESYLATE 5 MG PO TABS
5.0000 mg | ORAL_TABLET | Freq: Every morning | ORAL | Status: DC
  Administered 2014-05-08 – 2014-05-11 (×4): 5 mg via ORAL
  Filled 2014-05-07 (×5): qty 1

## 2014-05-07 MED ORDER — FENOFIBRATE 160 MG PO TABS
160.0000 mg | ORAL_TABLET | Freq: Every day | ORAL | Status: DC
  Administered 2014-05-08 – 2014-05-17 (×10): 160 mg via ORAL
  Filled 2014-05-07 (×12): qty 1

## 2014-05-07 MED ORDER — OXYCODONE-ACETAMINOPHEN 5-325 MG PO TABS
1.0000 | ORAL_TABLET | Freq: Four times a day (QID) | ORAL | Status: DC | PRN
  Administered 2014-05-08 – 2014-05-10 (×5): 1 via ORAL
  Filled 2014-05-07 (×8): qty 1

## 2014-05-07 MED ORDER — NITROGLYCERIN IN D5W 200-5 MCG/ML-% IV SOLN
5.0000 ug/min | INTRAVENOUS | Status: DC
  Administered 2014-05-07: 5 ug/min via INTRAVENOUS
  Filled 2014-05-07: qty 250

## 2014-05-07 MED ORDER — OXYCODONE-ACETAMINOPHEN 10-325 MG PO TABS: 1.0000 | ORAL_TABLET | Freq: Four times a day (QID) | ORAL | Status: DC | PRN

## 2014-05-07 NOTE — ED Provider Notes (Signed)
CSN: [id number]     Arrival date & time 05/07/14  2104 History   First MD Initiated Contact with Patient 05/07/14 2107     Chief Complaint  Patient presents with  . Chest Pain     (Consider location/radiation/quality/duration/timing/severity/associated sxs/prior Treatment) HPI Comments: Patient here complaining of substernal chest pain with associated dyspnea x2-3 days. Symptoms worse with exertion better with rest. No associated diaphoresis. Symptoms are similar to his prior anginal equivalent. No nitroglycerin use. Denies any fever or cough. Pain his last anywhere from minutes to an hour. Denies any leg pain or swelling. He denies using. No syncope. No palpitations appreciated.  Patient is a 73 y.o. male presenting with chest pain. The history is provided by the patient.  Chest Pain   Past Medical History  Diagnosis Date  . Diabetes mellitus 2007  . Hypertension   . Gout   . Hypercholesteremia   . Peripheral vascular disease     stents lower extremity  . Chronic pain     foot, hips  . Sleep apnea   . COPD (chronic obstructive pulmonary disease)   . 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 Chronic back pain   . Dysphagia   . Neuromuscular disorder   . Peripheral neuropathy   . Critical lower limb ischemia    Past Surgical History  Procedure Laterality Date  . Back surgery  7371,[id number]    2  . Open heart surgery  1990    prosthetic heart valve, one bypass  . Rotator cuff repair      right  . Cataract extraction      bilateral  . Coronary stent placement  2005    RCA vein graft A 3.0x13.0 TAXUS stent was then placed int he vessel a Viva 3.0x4.0 (perfusion balloon was made ready it was placed through the entire lenght of the stent  . Peripheral vascular procedures lower extremities      right external iliac  artery PTA and stenting as well as bilateral SFA intervention remotely. Repeat procedures in 2011 bilaterally  . Coronary artery bypass graft  1994    6  vessels  . [name]  06/13/2011    Procedure: Venia Minks DILATION;  Surgeon: [name], MD;  Location: [facility];  Service: Endoscopy;  Laterality: N/A;  Dilated to 56.   . Angioplasty illiac artery     Family History  Problem Relation Age of Onset  . Colon cancer Neg Hx   . Liver disease Neg Hx    History  Substance Use Topics  . Smoking status: Current Every Day Smoker -- 1.00 packs/day for 55 years    Types: Cigarettes    Start date: 08/19/1953  . Smokeless tobacco: Former Systems developer    Types: [place] date: 08/20/1995     Comment: Has quit on 3 occasions. Counseling given today 5-10 minutes   I am more than likely going to quit "  . Alcohol Use: Yes     Comment: socially, sometimes 12 ounce beer daily, may go month without/no whiskey    Review of Systems  Cardiovascular: Positive for chest pain.  All other systems reviewed and are negative.     Allergies  Review of patient's allergies indicates no known allergies.  Home Medications   Prior to Admission medications   Medication Sig Start Date End Date Taking? Authorizing Provider  albuterol (VOSPIRE ER) 4 MG 12 hr tablet Take 4 mg by mouth 2 (  two) times daily.     Historical Provider, MD  albuterol-ipratropium (COMBIVENT) 18-103 MCG/ACT inhaler Inhale 1 puff into the lungs 4 (four) times daily. Coughing/ Shortness of Breath    Historical Provider, MD  allopurinol (ZYLOPRIM) 300 MG tablet Take 300 mg by mouth at bedtime.     Historical Provider, MD  amLODipine (NORVASC) 5 MG tablet Take 5 mg by mouth every morning.     Historical Provider, MD  aspirin EC 81 MG tablet Take 81 mg by mouth at bedtime.    Historical Provider, MD  CRESTOR 20 MG tablet TAKE (1) TABLET BY MOUTH AT BEDTIME FOR CHOLESTEROL. 05/04/14   Troy Sine, MD  fenofibrate (TRICOR) 145 MG tablet Take 1 tablet (145 mg total) by mouth daily. 02/24/14   Troy Sine, MD  fish oil-omega-3 fatty acids 1000 MG capsule Take 1 capsule (1 g total) by mouth 2  (two) times daily. 01/22/13   Tommy Medal, RPH-CPP  furosemide (LASIX) 80 MG tablet Take 40 mg by mouth daily.    Historical Provider, MD  glimepiride (AMARYL) 2 MG tablet Take 2 mg by mouth 2 (two) times daily.     Historical Provider, MD  isosorbide mononitrate (IMDUR) 30 MG 24 hr tablet Take 1 tablet (30 mg total) by mouth daily. 09/02/13   Troy Sine, MD  metoprolol succinate (TOPROL-XL) 25 MG 24 hr tablet Take 3 tablets (75 mg total) by mouth daily. 08/16/13   Velvet Bathe, MD  oxyCODONE-acetaminophen (PERCOCET) 10-325 MG per tablet Take 1 tablet by mouth every 6 (six) hours as needed for pain.  01/19/13   Historical Provider, MD  potassium chloride SA (K-DUR,KLOR-CON) 20 MEQ tablet Take 20 mEq by mouth daily.      Historical Provider, MD  pregabalin (LYRICA) 50 MG capsule Take 50 mg by mouth at bedtime.     Historical Provider, MD  ramipril (ALTACE) 2.5 MG capsule Take 2.5 mg by mouth 2 (two) times daily.    Historical Provider, MD  sitaGLIPtin (JANUVIA) 50 MG tablet Take 50 mg by mouth daily.    Historical Provider, MD  warfarin (COUMADIN) 5 MG tablet Take 1 to 1.5 tablets by mouth daily as directed by coumadin clinic 03/08/14   Tommy Medal, RPH-CPP   BP 185/78  Pulse 109  Temp(Src) 98.2 F (36.8 C) (Oral)  Ht 5\' 10"  (1.778 m)  Wt 215 lb (97.523 kg)  BMI 30.85 kg/m2  SpO2 99% Physical Exam  Nursing note and vitals reviewed. Constitutional: He is oriented to person, place, and time. He appears well-developed and well-nourished.  Non-toxic appearance. No distress.  HENT:  Head: Normocephalic and atraumatic.  Eyes: Conjunctivae, EOM and lids are normal. Pupils are equal, round, and reactive to light.  Neck: Normal range of motion. Neck supple. No tracheal deviation present. No mass present.  Cardiovascular: Normal rate, regular rhythm and normal heart sounds.  Exam reveals no gallop.   No murmur heard. Pulmonary/Chest: Effort normal and breath sounds normal. No stridor. No  respiratory distress. He has no decreased breath sounds. He has no wheezes. He has no rhonchi. He has no rales.  Abdominal: Soft. Normal appearance and bowel sounds are normal. He exhibits no distension. There is no tenderness. There is no rebound and no CVA tenderness.  Musculoskeletal: Normal range of motion. He exhibits no edema and no tenderness.  Neurological: He is alert and oriented to person, place, and time. He has normal strength. No cranial nerve deficit or sensory deficit. GCS eye  subscore is 4. GCS verbal subscore is 5. GCS motor subscore is 6.  Skin: Skin is warm and dry. No abrasion and no rash noted.  Psychiatric: He has a normal mood and affect. His speech is normal and behavior is normal.    ED Course  Procedures (including critical care time) Labs Review Labs Reviewed  TROPONIN I  CBC WITH DIFFERENTIAL  COMPREHENSIVE METABOLIC PANEL    Imaging Review No results found.   EKG Interpretation   Date/Time:  Saturday May 07 2014 21:12:32 EDT Ventricular Rate:  108 PR Interval:  157 QRS Duration: 143 QT Interval:  399 QTC Calculation: 535 R Axis:   92 Text Interpretation:  Sinus or ectopic atrial tachycardia Paired  ventricular premature complexes Nonspecific intraventricular conduction  delay Repol abnrm suggests ischemia, anterolateral No significant change  since last tracing Confirmed by Torianne Laflam  MD, Earnest Mcgillis ([id number]) on 05/07/2014  9:25:57 PM      MDM   Final diagnoses:  None   Patient's EKG unchanged from prior. His are to have his daily aspirin. Patient placed nitroglycerin drip for his chest discomfort. Will be admitted to step down      Leota Jacobsen, MD 05/07/14 [phone]

## 2014-05-07 NOTE — ED Notes (Signed)
Pt states chest pain started a couple of days ago and have progressively gotten worse with some SOB.

## 2014-05-07 NOTE — H&P (Signed)
PCP:   [name], MD   Chief Complaint:  cp  HPI: 73 yo male s/p cabg twice, several stents, dm, htn, tobacco abuse comes in with several days of exertional sscp that radiates up to his jaw bilaterally and down his right arm.  He does not usually have anginal symptoms and doesn't even have ntg at home.  No fevers.  No cough.  No swelling in his legs.  No n/v.  No abd pain.  Pain has been relieved with ntg.  The pain is gone now.  He continues to smoke tobacco.  Review of Systems:  Positive and negative as per HPI otherwise all other systems are negative  Past Medical History: Past Medical History  Diagnosis Date  . Diabetes mellitus 2007  . Hypertension   . Gout   . Hypercholesteremia   . Peripheral vascular disease     stents lower extremity  . Chronic pain     foot, hips  . Sleep apnea   . COPD (chronic obstructive pulmonary disease)   . 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 Chronic back pain   . Dysphagia   . Neuromuscular disorder   . Peripheral neuropathy   . Critical lower limb ischemia    Past Surgical History  Procedure Laterality Date  . Back surgery  1027,[id number]    2  . Open heart surgery  1990    prosthetic heart valve, one bypass  . Rotator cuff repair      right  . Cataract extraction      bilateral  . Coronary stent placement  2005    RCA vein graft A 3.0x13.0 TAXUS stent was then placed int he vessel a Viva 3.0x4.0 (perfusion balloon was made ready it was placed through the entire lenght of the stent  . Peripheral vascular procedures lower extremities      right external iliac  artery PTA and stenting as well as bilateral SFA intervention remotely. Repeat procedures in 2011 bilaterally  . Coronary artery bypass graft  1994    6 vessels  . [name]  06/13/2011    Procedure: Venia Minks DILATION;  Surgeon: [name], MD;  Location: [facility];  Service: Endoscopy;  Laterality: N/A;  Dilated to 56.   . Angioplasty illiac artery       Medications: Prior to Admission medications   Medication Sig Start Date End Date Taking? Authorizing Provider  albuterol (VOSPIRE ER) 4 MG 12 hr tablet Take 4 mg by mouth 2 (two) times daily.    Yes Historical Provider, MD  albuterol-ipratropium (COMBIVENT) 18-103 MCG/ACT inhaler Inhale 1 puff into the lungs 4 (four) times daily. Coughing/ Shortness of Breath   Yes Historical Provider, MD  allopurinol (ZYLOPRIM) 300 MG tablet Take 300 mg by mouth at bedtime.    Yes Historical Provider, MD  amLODipine (NORVASC) 5 MG tablet Take 5 mg by mouth every morning.    Yes Historical Provider, MD  aspirin EC 81 MG tablet Take 81 mg by mouth at bedtime.   Yes Historical Provider, MD  CRESTOR 20 MG tablet TAKE (1) TABLET BY MOUTH AT BEDTIME FOR CHOLESTEROL. 05/04/14  Yes Troy Sine, MD  fenofibrate (TRICOR) 145 MG tablet Take 1 tablet (145 mg total) by mouth daily. 02/24/14  Yes Troy Sine, MD  fish oil-omega-3 fatty acids 1000 MG capsule Take 1 capsule (1 g total) by mouth 2 (two) times daily. 01/22/13  Yes Tommy Medal, RPH-CPP  furosemide (LASIX)  80 MG tablet Take 40 mg by mouth daily.   Yes Historical Provider, MD  glimepiride (AMARYL) 2 MG tablet Take 2 mg by mouth 2 (two) times daily.    Yes Historical Provider, MD  isosorbide mononitrate (IMDUR) 30 MG 24 hr tablet Take 1 tablet (30 mg total) by mouth daily. 09/02/13  Yes Troy Sine, MD  metoprolol succinate (TOPROL-XL) 25 MG 24 hr tablet Take 25 mg by mouth 2 (two) times daily.   Yes Historical Provider, MD  oxyCODONE-acetaminophen (PERCOCET) 10-325 MG per tablet Take 1 tablet by mouth every 6 (six) hours as needed for pain.  01/19/13  Yes Historical Provider, MD  potassium chloride SA (K-DUR,KLOR-CON) 20 MEQ tablet Take 20 mEq by mouth daily.     Yes Historical Provider, MD  pregabalin (LYRICA) 50 MG capsule Take 50 mg by mouth at bedtime.    Yes Historical Provider, MD  ramipril (ALTACE) 2.5 MG capsule Take 2.5 mg by mouth 2 (two) times  daily.   Yes Historical Provider, MD  sitaGLIPtin (JANUVIA) 50 MG tablet Take 50 mg by mouth daily.   Yes Historical Provider, MD  warfarin (COUMADIN) 5 MG tablet Take 5-7.5 mg by mouth See admin instructions. Takes one and one-half tablet (7.5mg  total) on Mondays and Fridays. Takes one tablet (5mg  total) on all other days   Yes Historical Provider, MD    Allergies:  No Known Allergies  Social History:  reports that he has been smoking Cigarettes.  He started smoking about 60 years ago. He has a 55 pack-year smoking history. He quit smokeless tobacco use about 18 years ago. His smokeless tobacco use included Chew. He reports that he drinks alcohol. He reports that he does not use illicit drugs.  Family History: Family History  Problem Relation Age of Onset  . Colon cancer Neg Hx   . Liver disease Neg Hx     Physical Exam: Filed Vitals:   05/07/14 2200 05/07/14 2218 05/07/14 2230 05/07/14 2300  BP: 140/67  123/60 143/60  Pulse: 108 101 97 93  Temp:      TempSrc:      Resp: 24 22 21 19   Height:      Weight:      SpO2: 95% 96% 98% 99%   General appearance: alert, cooperative and no distress Head: Normocephalic, without obvious abnormality, atraumatic Eyes: negative Nose: Nares normal. Septum midline. Mucosa normal. No drainage or sinus tenderness. Neck: no JVD and supple, symmetrical, trachea midline Lungs: clear to auscultation bilaterally Heart: regular rate and rhythm, S1, S2 normal, no murmur, click, rub or gallop Abdomen: soft, non-tender; bowel sounds normal; no masses,  no organomegaly Extremities: extremities normal, atraumatic, no cyanosis or edema Pulses: 2+ and symmetric Skin: Skin color, texture, turgor normal. No rashes or lesions Neurologic: Grossly normal    Labs on Admission:   Recent Labs  05/07/14 2155  NA 141  K 4.1  CL 101  CO2 24  GLUCOSE 168*  BUN 23  CREATININE 1.12  CALCIUM 10.2    Recent Labs  05/07/14 2155  AST 31  ALT 17   ALKPHOS 32*  BILITOT 0.6  PROT 7.9  ALBUMIN 4.4    Recent Labs  05/07/14 2155  WBC 9.2  NEUTROABS 5.3  HGB 14.4  HCT 43.2  MCV 90.9  PLT 256    Recent Labs  05/07/14 2155  TROPONINI <0.30   Radiological Exams on Admission: Dg Chest Port 1 View  05/07/2014   CLINICAL DATA:  Worsening chest pain and shortness breath  EXAM: PORTABLE CHEST - 1 VIEW  COMPARISON:  None.  FINDINGS: Sternotomy wires overlie the stable enlarged cardiac silhouette. There is chronic bronchitic markings and mild central venous congestion. Left basilar atelectasis similar prior.  IMPRESSION: 1. No interval change. 2. Cardiomegaly, central venous congestion and left basilar atelectasis.   Electronically Signed   By: Suzy Bouchard M.D.   On: 05/07/2014 21:43    Assessment/Plan  73 yo male with unstable angina  Principal Problem:   Unstable angina-  Pain atypical but with some typical features and with his extensive cardiac history agree with treating as [name] until proven otherwise.  ekg no acute changes, same as old.  Will cont ntg gtt for now until he rules out.  Initial trop in neg which is reassuring.  inr over 3 on coumadin, so will not give lovenox.  Cont his chronic home cardiac meds.  Serial enzymes.  Cardiology consult.  obs on stepdown due to ntg gtt.  His cardiologist is dr [name], may be able to arrange outpt f/u with them by phone call tomorrow if all goes well tonight.    Active Problems:   Long term (current) use of anticoagulants   H/O aortic valve replacement- [employer] Jude   CAD - CABG '90 with re do '94    DM2 (diabetes mellitus, type 2)   Carotid stenosis   Cardiomyopathy, ischemic - EF 25-30% by echo 5/14   COPD (chronic obstructive pulmonary disease)   LBBB (left bundle branch block)  obs on stepdown.  Full code.  Maytal Mijangos A 05/07/2014, 11:41 PM

## 2014-05-08 DIAGNOSIS — I4949 Other premature depolarization: Secondary | ICD-10-CM

## 2014-05-08 DIAGNOSIS — T45515A Adverse effect of anticoagulants, initial encounter: Secondary | ICD-10-CM

## 2014-05-08 DIAGNOSIS — I493 Ventricular premature depolarization: Secondary | ICD-10-CM | POA: Diagnosis present

## 2014-05-08 DIAGNOSIS — R339 Retention of urine, unspecified: Secondary | ICD-10-CM | POA: Diagnosis not present

## 2014-05-08 DIAGNOSIS — Z9849 Cataract extraction status, unspecified eye: Secondary | ICD-10-CM | POA: Diagnosis not present

## 2014-05-08 DIAGNOSIS — E1159 Type 2 diabetes mellitus with other circulatory complications: Secondary | ICD-10-CM | POA: Diagnosis present

## 2014-05-08 DIAGNOSIS — I2589 Other forms of chronic ischemic heart disease: Secondary | ICD-10-CM | POA: Diagnosis present

## 2014-05-08 DIAGNOSIS — I10 Essential (primary) hypertension: Secondary | ICD-10-CM | POA: Diagnosis present

## 2014-05-08 DIAGNOSIS — M549 Dorsalgia, unspecified: Secondary | ICD-10-CM | POA: Diagnosis present

## 2014-05-08 DIAGNOSIS — Z7982 Long term (current) use of aspirin: Secondary | ICD-10-CM | POA: Diagnosis not present

## 2014-05-08 DIAGNOSIS — I2582 Chronic total occlusion of coronary artery: Secondary | ICD-10-CM | POA: Diagnosis present

## 2014-05-08 DIAGNOSIS — I798 Other disorders of arteries, arterioles and capillaries in diseases classified elsewhere: Secondary | ICD-10-CM | POA: Diagnosis present

## 2014-05-08 DIAGNOSIS — J690 Pneumonitis due to inhalation of food and vomit: Secondary | ICD-10-CM | POA: Diagnosis not present

## 2014-05-08 DIAGNOSIS — Y849 Medical procedure, unspecified as the cause of abnormal reaction of the patient, or of later complication, without mention of misadventure at the time of the procedure: Secondary | ICD-10-CM | POA: Diagnosis present

## 2014-05-08 DIAGNOSIS — E785 Hyperlipidemia, unspecified: Secondary | ICD-10-CM | POA: Diagnosis present

## 2014-05-08 DIAGNOSIS — Z7901 Long term (current) use of anticoagulants: Secondary | ICD-10-CM | POA: Diagnosis not present

## 2014-05-08 DIAGNOSIS — G8929 Other chronic pain: Secondary | ICD-10-CM | POA: Diagnosis present

## 2014-05-08 DIAGNOSIS — L97509 Non-pressure chronic ulcer of other part of unspecified foot with unspecified severity: Secondary | ICD-10-CM | POA: Diagnosis present

## 2014-05-08 DIAGNOSIS — I200 Unstable angina: Secondary | ICD-10-CM | POA: Diagnosis present

## 2014-05-08 DIAGNOSIS — T82897A Other specified complication of cardiac prosthetic devices, implants and grafts, initial encounter: Secondary | ICD-10-CM | POA: Diagnosis present

## 2014-05-08 DIAGNOSIS — I2510 Atherosclerotic heart disease of native coronary artery without angina pectoris: Secondary | ICD-10-CM | POA: Diagnosis present

## 2014-05-08 DIAGNOSIS — Z79899 Other long term (current) drug therapy: Secondary | ICD-10-CM | POA: Diagnosis not present

## 2014-05-08 DIAGNOSIS — I6529 Occlusion and stenosis of unspecified carotid artery: Secondary | ICD-10-CM | POA: Diagnosis present

## 2014-05-08 DIAGNOSIS — D6832 Hemorrhagic disorder due to extrinsic circulating anticoagulants: Secondary | ICD-10-CM | POA: Diagnosis present

## 2014-05-08 DIAGNOSIS — R079 Chest pain, unspecified: Secondary | ICD-10-CM | POA: Diagnosis present

## 2014-05-08 DIAGNOSIS — E669 Obesity, unspecified: Secondary | ICD-10-CM | POA: Diagnosis present

## 2014-05-08 DIAGNOSIS — Z954 Presence of other heart-valve replacement: Secondary | ICD-10-CM | POA: Diagnosis not present

## 2014-05-08 DIAGNOSIS — E8779 Other fluid overload: Secondary | ICD-10-CM | POA: Diagnosis not present

## 2014-05-08 DIAGNOSIS — J9600 Acute respiratory failure, unspecified whether with hypoxia or hypercapnia: Secondary | ICD-10-CM | POA: Diagnosis not present

## 2014-05-08 DIAGNOSIS — E118 Type 2 diabetes mellitus with unspecified complications: Secondary | ICD-10-CM

## 2014-05-08 DIAGNOSIS — J449 Chronic obstructive pulmonary disease, unspecified: Secondary | ICD-10-CM

## 2014-05-08 DIAGNOSIS — I442 Atrioventricular block, complete: Secondary | ICD-10-CM | POA: Diagnosis not present

## 2014-05-08 DIAGNOSIS — F17200 Nicotine dependence, unspecified, uncomplicated: Secondary | ICD-10-CM | POA: Diagnosis present

## 2014-05-08 DIAGNOSIS — G609 Hereditary and idiopathic neuropathy, unspecified: Secondary | ICD-10-CM | POA: Diagnosis present

## 2014-05-08 DIAGNOSIS — I359 Nonrheumatic aortic valve disorder, unspecified: Secondary | ICD-10-CM

## 2014-05-08 DIAGNOSIS — I9589 Other hypotension: Secondary | ICD-10-CM | POA: Diagnosis not present

## 2014-05-08 DIAGNOSIS — I498 Other specified cardiac arrhythmias: Secondary | ICD-10-CM | POA: Diagnosis present

## 2014-05-08 DIAGNOSIS — I447 Left bundle-branch block, unspecified: Secondary | ICD-10-CM | POA: Diagnosis present

## 2014-05-08 DIAGNOSIS — Z6831 Body mass index (BMI) 31.0-31.9, adult: Secondary | ICD-10-CM | POA: Diagnosis not present

## 2014-05-08 DIAGNOSIS — M109 Gout, unspecified: Secondary | ICD-10-CM | POA: Diagnosis present

## 2014-05-08 DIAGNOSIS — G4733 Obstructive sleep apnea (adult) (pediatric): Secondary | ICD-10-CM | POA: Diagnosis present

## 2014-05-08 DIAGNOSIS — R791 Abnormal coagulation profile: Secondary | ICD-10-CM | POA: Diagnosis present

## 2014-05-08 LAB — PROTIME-INR
INR: 3.73 — ABNORMAL HIGH (ref 0.00–1.49)
INR: 4.03 — AB (ref 0.00–1.49)
PROTHROMBIN TIME: 39.2 s — AB (ref 11.6–15.2)
Prothrombin Time: 36.9 seconds — ABNORMAL HIGH (ref 11.6–15.2)

## 2014-05-08 LAB — GLUCOSE, CAPILLARY
GLUCOSE-CAPILLARY: 159 mg/dL — AB (ref 70–99)
Glucose-Capillary: 106 mg/dL — ABNORMAL HIGH (ref 70–99)
Glucose-Capillary: 176 mg/dL — ABNORMAL HIGH (ref 70–99)
Glucose-Capillary: 151 mg/dL — ABNORMAL HIGH (ref 70–99)

## 2014-05-08 LAB — TROPONIN I
Troponin I: <0.3 ng/mL (ref <0.30)
Troponin I: <0.3 ng/mL (ref <0.30)

## 2014-05-08 LAB — MRSA PCR SCREENING: MRSA BY PCR: NEGATIVE

## 2014-05-08 LAB — MAGNESIUM: Magnesium: 2 mg/dL (ref 1.5–2.5)

## 2014-05-08 MED ORDER — NITROGLYCERIN 0.4 MG SL SUBL: 0.4000 mg | SUBLINGUAL_TABLET | SUBLINGUAL | Status: DC | PRN

## 2014-05-08 MED ORDER — WARFARIN - PHARMACIST DOSING INPATIENT: Status: DC

## 2014-05-08 MED ORDER — POVIDONE-IODINE 10 % EX SOLN
Freq: Every day | CUTANEOUS | Status: DC
  Administered 2014-05-08: 1 via TOPICAL
  Administered 2014-05-09: 12:00:00 via TOPICAL
  Administered 2014-05-10: 1 via TOPICAL
  Administered 2014-05-11 – 2014-05-16 (×6): via TOPICAL

## 2014-05-08 MED ORDER — INSULIN ASPART 100 UNIT/ML ~~LOC~~ SOLN
0.0000 [IU] | Freq: Three times a day (TID) | SUBCUTANEOUS | Status: DC
  Administered 2014-05-08 (×2): 2 [IU] via SUBCUTANEOUS
  Administered 2014-05-09: 1 [IU] via SUBCUTANEOUS
  Administered 2014-05-09: 5 [IU] via SUBCUTANEOUS
  Administered 2014-05-09 – 2014-05-10 (×4): 2 [IU] via SUBCUTANEOUS
  Administered 2014-05-11: 3 [IU] via SUBCUTANEOUS

## 2014-05-08 MED ORDER — INFLUENZA VAC SPLIT QUAD 0.5 ML IM SUSY
0.5000 mL | PREFILLED_SYRINGE | INTRAMUSCULAR | Status: AC
  Administered 2014-05-09: 0.5 mL via INTRAMUSCULAR
  Filled 2014-05-08: qty 0.5

## 2014-05-08 MED ORDER — WARFARIN SODIUM 5 MG PO TABS: 5.0000 mg | ORAL_TABLET | ORAL | Status: DC

## 2014-05-08 MED ORDER — OMEGA-3-ACID ETHYL ESTERS 1 G PO CAPS
1.0000 g | ORAL_CAPSULE | Freq: Two times a day (BID) | ORAL | Status: DC
  Administered 2014-05-08 – 2014-05-17 (×16): 1 g via ORAL
  Filled 2014-05-08 (×20): qty 1

## 2014-05-08 MED ORDER — LEVALBUTEROL HCL 0.63 MG/3ML IN NEBU
0.6300 mg | INHALATION_SOLUTION | Freq: Three times a day (TID) | RESPIRATORY_TRACT | Status: DC
Start: 2014-05-08 — End: 2014-05-11
  Administered 2014-05-08 – 2014-05-11 (×9): 0.63 mg via RESPIRATORY_TRACT
  Filled 2014-05-08 (×14): qty 3

## 2014-05-08 MED ORDER — WARFARIN - PHYSICIAN DOSING INPATIENT: Freq: Every day | Status: DC

## 2014-05-08 MED ORDER — WARFARIN SODIUM 7.5 MG PO TABS: 7.5000 mg | ORAL_TABLET | ORAL | Status: DC

## 2014-05-08 MED ORDER — IPRATROPIUM BROMIDE 0.02 % IN SOLN
0.5000 mg | Freq: Three times a day (TID) | RESPIRATORY_TRACT | Status: DC
  Administered 2014-05-08 – 2014-05-16 (×22): 0.5 mg via RESPIRATORY_TRACT
  Filled 2014-05-08 (×22): qty 2.5

## 2014-05-08 MED ORDER — INSULIN ASPART 100 UNIT/ML ~~LOC~~ SOLN: 0.0000 [IU] | SUBCUTANEOUS | Status: DC

## 2014-05-08 MED ORDER — ISOSORBIDE MONONITRATE ER 30 MG PO TB24
45.0000 mg | ORAL_TABLET | Freq: Every day | ORAL | Status: DC
  Administered 2014-05-08 – 2014-05-11 (×4): 45 mg via ORAL
  Filled 2014-05-08 (×2): qty 2
  Filled 2014-05-08 (×3): qty 1

## 2014-05-08 NOTE — Progress Notes (Signed)
UR completed 

## 2014-05-08 NOTE — Progress Notes (Addendum)
ANTICOAGULATION CONSULT NOTE - Initial Consult  Pharmacy Consult for Coumadin Indication: St Jude's AVR  No Known Allergies  Patient Measurements: Height: 5\' 10"  (177.8 cm) Weight: 214 lb 8.1 oz (97.3 kg) IBW/kg (Calculated) : 73  Vital Signs: Temp: 98.1 F (36.7 C) (09/20 0800) Temp src: Oral (09/20 0800) BP: 153/52 mmHg (09/20 0800) Pulse Rate: 63 (09/20 0800)  Labs:  Recent Labs  05/07/14 2155 05/07/14 2205 05/08/14 0352  HGB 14.4  --   --   HCT 43.2  --   --   PLT 256  --   --   LABPROT  --  36.9* 39.2*  INR  --  3.73* 4.03*  CREATININE 1.12  --   --   TROPONINI <0.30  --  <0.30    Estimated Creatinine Clearance: 68.7 ml/min (by C-G formula based on Cr of 1.12).   Medical History: Past Medical History  Diagnosis Date  . Diabetes mellitus 2007  . Hypertension   . Gout   . Hypercholesteremia   . Peripheral vascular disease     stents lower extremity  . Chronic pain     foot, hips  . Sleep apnea   . COPD (chronic obstructive pulmonary disease)   . 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 Chronic back pain   . Dysphagia   . Neuromuscular disorder   . Peripheral neuropathy   . Critical lower limb ischemia     Medications:  Prescriptions prior to admission  Medication Sig Dispense Refill  . albuterol (VOSPIRE ER) 4 MG 12 hr tablet Take 4 mg by mouth 2 (two) times daily.       [name] albuterol-ipratropium (COMBIVENT) 18-103 MCG/ACT inhaler Inhale 1 puff into the lungs 4 (four) times daily. Coughing/ Shortness of Breath      . allopurinol (ZYLOPRIM) 300 MG tablet Take 300 mg by mouth at bedtime.       [name] amLODipine (NORVASC) 5 MG tablet Take 5 mg by mouth every morning.       [name] aspirin EC 81 MG tablet Take 81 mg by mouth at bedtime.      . CRESTOR 20 MG tablet TAKE (1) TABLET BY MOUTH AT BEDTIME FOR CHOLESTEROL.  30 tablet  7  . fenofibrate (TRICOR) 145 MG tablet Take 1 tablet (145 mg total) by mouth daily.  30 tablet  9  . fish oil-omega-3 fatty  acids 1000 MG capsule Take 1 capsule (1 g total) by mouth 2 (two) times daily.      . furosemide (LASIX) 80 MG tablet Take 40 mg by mouth daily.      [name] glimepiride (AMARYL) 2 MG tablet Take 2 mg by mouth 2 (two) times daily.       . isosorbide mononitrate (IMDUR) 30 MG 24 hr tablet Take 1 tablet (30 mg total) by mouth daily.  30 tablet  11  . metoprolol succinate (TOPROL-XL) 25 MG 24 hr tablet Take 25 mg by mouth 2 (two) times daily.      [name] oxyCODONE-acetaminophen (PERCOCET) 10-325 MG per tablet Take 1 tablet by mouth every 6 (six) hours as needed for pain.       . potassium chloride SA (K-DUR,KLOR-CON) 20 MEQ tablet Take 20 mEq by mouth daily.        . pregabalin (LYRICA) 50 MG capsule Take 50 mg by mouth at bedtime.       . ramipril (ALTACE) 2.5 MG capsule Take 2.5 mg by mouth 2 (two)  times daily.      . sitaGLIPtin (JANUVIA) 50 MG tablet Take 50 mg by mouth daily.      [name] warfarin (COUMADIN) 5 MG tablet Take 5-7.5 mg by mouth See admin instructions. Takes one and one-half tablet (7.5mg  total) on Mondays and Fridays. Takes one tablet (5mg  total) on all other days        Assessment: 73 yo M on chronic Coumadin for hx for St Jude's AVR.  Home dose listed above.  INR supra-therapeutic on admission and continues to trend up.  He has been maintained on this dose for >1 year with mostly INR checks within goal range. No bleeding noted.     Goal of Therapy:  INR 2.5-3.5   Plan:  Hold Coumadin today Daily INR  Biagio Borg 05/08/2014,8:33 AM

## 2014-05-08 NOTE — Progress Notes (Signed)
PT started on neb treatments he does smoke, and does appear to have bronchitis most likely chronic.

## 2014-05-08 NOTE — Progress Notes (Addendum)
[facility] PROGRESS NOTE  DRAVON NOTT KXF:[id number] DOB: 05/05/1941 DOA: 05/07/2014 PCP: [name], MD    Code Status: Full code Family Communication: Discussed with patient; family not available Disposition Plan: Discharge when clinically appropriate   Consultants:  Cardiology pending  Procedures:  Echocardiogram pending  Antibiotics:  None   HPI/Subjective: The patient is sitting up in bed, getting ready to eat breakfast. He has some palpitations, but he denies chest pain currently. He denies shortness of breath or pleurisy.  Objective: Filed Vitals:   05/08/14 0800  BP: 153/52  Pulse: 63  Temp: 98.1 F (36.7 C)  Resp: 20   oxygen saturation 97% on room air.   Intake/Output Summary (Last 24 hours) at 05/08/14 0914 Last data filed at 05/08/14 0500  Gross per 24 hour  Intake      0 ml  Output    500 ml  Net   -500 ml   Filed Weights   05/07/14 2109 05/07/14 2347 05/08/14 0500  Weight: 97.523 kg (215 lb) 97.3 kg (214 lb 8.1 oz) 97.3 kg (214 lb 8.1 oz)    Exam:   General:  73 year old Caucasian man sitting up in bed, in no apparent distress.  Cardiovascular: S1, S2, with ectopy and a soft systolic murmur and a S2 click  Respiratory: occasional fine crackles and wheezes; breathing nonlabored.  Abdomen: positive bowel sounds, soft, nontender, nondistended.  Musculoskeletal/extremities: Multiple varicosities in both legs ; trace of pedal edema on the left greater than right; evidence of vein harvesting bilaterally. Pedal pulses barely palpable. Feet are warm to touch.  Skin: Excoriated lesions on toes on both feet without erythema; multiple varicosities of both legs.  Neurologic: He is alert and oriented x3. Cranial nerves II through XII are grossly intact.  Data Reviewed: Basic Metabolic Panel:  Recent Labs Lab 05/07/14 2155 05/08/14 0348  NA 141  --   K 4.1  --   CL 101  --   CO2 24  --   GLUCOSE 168*  --   BUN 23  --    CREATININE 1.12  --   CALCIUM 10.2  --   MG  --  2.0   Liver Function Tests:  Recent Labs Lab 05/07/14 2155  AST 31  ALT 17  ALKPHOS 32*  BILITOT 0.6  PROT 7.9  ALBUMIN 4.4   No results found for this basename: LIPASE, AMYLASE,  in the last 168 hours No results found for this basename: AMMONIA,  in the last 168 hours CBC:  Recent Labs Lab 05/07/14 2155  WBC 9.2  NEUTROABS 5.3  HGB 14.4  HCT 43.2  MCV 90.9  PLT 256   Cardiac Enzymes:  Recent Labs Lab 05/07/14 2155 05/08/14 0352  TROPONINI <0.30 <0.30   BNP (last 3 results)  Recent Labs  08/10/13 1841 01/19/14 2042  PROBNP 784.4* 1373.0*   CBG:  Recent Labs Lab 05/08/14 0727  GLUCAP 106*    Recent Results (from the past 240 hour(s))  MRSA PCR SCREENING     Status: None   Collection Time    05/08/14 12:06 AM      Result Value Ref Range Status   MRSA by PCR NEGATIVE  NEGATIVE Final   Comment:            The GeneXpert MRSA Assay (FDA     approved for NASAL specimens     only), is one component of a     comprehensive MRSA colonization     surveillance  program. It is not     intended to diagnose MRSA     infection nor to guide or     monitor treatment for     MRSA infections.     Studies: Dg Chest Port 1 View  05/07/2014   CLINICAL DATA:  Worsening chest pain and shortness breath  EXAM: PORTABLE CHEST - 1 VIEW  COMPARISON:  None.  FINDINGS: Sternotomy wires overlie the stable enlarged cardiac silhouette. There is chronic bronchitic markings and mild central venous congestion. Left basilar atelectasis similar prior.  IMPRESSION: 1. No interval change. 2. Cardiomegaly, central venous congestion and left basilar atelectasis.   Electronically Signed   By: Suzy Bouchard M.D.   On: 05/07/2014 21:43    Scheduled Meds: . albuterol  4 mg Oral BID  . allopurinol  300 mg Oral QHS  . amLODipine  5 mg Oral q morning - 10a  . aspirin EC  81 mg Oral QHS  . atorvastatin  20 mg Oral q1800  . fenofibrate   160 mg Oral Daily  . furosemide  40 mg Oral Daily  . insulin aspart  0-9 Units Subcutaneous TID WC  . ipratropium-albuterol  3 mL Inhalation QID  . isosorbide mononitrate  30 mg Oral Daily  . metoprolol succinate  25 mg Oral BID  . potassium chloride SA  20 mEq Oral Daily  . pregabalin  50 mg Oral QHS  . ramipril  2.5 mg Oral BID  . [START ON 05/09/2014] Warfarin - Pharmacist Dosing Inpatient   Does not apply Q24H   Continuous Infusions: . nitroGLYCERIN 5 mcg/min (05/07/14 2126)   Assessment and plan:  Principal Problem:   Unstable angina Active Problems:   H/O aortic valve replacement- St Jude   Cardiomyopathy, ischemic - EF 25-30% by echo 5/14   PVD prior SFA PTA with chronic LE disease, not amenable to PTA   LBBB (left bundle branch block)   Warfarin-induced coagulopathy   HTN (hypertension)   PVC's (premature ventricular contractions)   Long term (current) use of anticoagulants   CAD - CABG '90 with re do '94    Tobacco abuse   DM2 (diabetes mellitus, type 2)   Carotid stenosis   Hyperlipidemia with target LDL less than 70   COPD (chronic obstructive pulmonary disease)    1. Chest pain, presumed to be unstable angina.  He is chest pain-free now. He received nitroglycerin drip overnight. His cardiac enzyme troponin I. is negative x2. His EKG reveals frequent PVCs and lateral T wave abnormalities. His magnesium level is within normal limits. His potassium level is within normal limits. We'll discontinue the nitroglycerin drip and increase the dose of isosorbide mononitrate to 45 mg daily. We'll also add when necessary nitroglycerin. He is already anticoagulated or over- anticoagulated. We'll order 2-D echocardiogram and consult cardiology tomorrow morning. Coronary artery disease with a history of CABG x2 and coronary stent placement in 2005.  We'll continue medical therapy with beta blocker, statin, anticoagulant, ACE inhibitor, and nitrate. Ischemic cardiomyopathy with an  ejection fraction of 25-30% by Echo in May 2014.  There is vascular congestion on the chest x-ray but no pulmonary edema. He has some mild lower extremity edema, but he attributes this to previous vein harvesting. We'll continue medical therapy with Lasix, beta blocker, and ACE inhibitor. PVCs.  The patient's EKG this morning revealed AV block with frequent PVCs. Compared to the EKG in June 2015, there was evidence of occasional PVC and left bundle branch block on  the June EKG. His magnesium level and potassium levels are within normal limits. We'll continue to monitor. If he becomes symptomatic or if he develops nonsustained V. tach, will discuss the patient with cardiology at [facility]. History of aortic valve replacement-St. Jude with chronic anti-coagulation with warfarin. His INR is supratherapeutic. Pharmacy has been consulted to assist with management. 2-D echocardiogram ordered and is pending. Hypertension. Currently stable on beta blocker and ACE inhibitor. Hyperlipidemia. We'll continue statin and fibrate. Will order a fasting lipid profile tomorrow morning. COPD with ongoing smoking. We'll continue bronchodilators, but will change beta agonist to Xopenex because of the PVCs. We'll discontinue oral albuterol tabs. The patient was advised to stop smoking completely. Diabetes mellitus, type II with peripheral vascular disease. We'll hold his oral hypoglycemic agents, metformin and Januvia as his blood glucose is low normal. We'll treat with sliding scale NovoLog. We'll order hemoglobin A1c and TSH.      Time spent: 35 minutes.    [facility] Pager [phone]. If 7PM-7AM, please contact night-coverage at [email or web address], password [facility] 05/08/2014, 9:14 AM  LOS: 1 day

## 2014-05-09 DIAGNOSIS — I2510 Atherosclerotic heart disease of native coronary artery without angina pectoris: Secondary | ICD-10-CM

## 2014-05-09 DIAGNOSIS — T82897A Other specified complication of cardiac prosthetic devices, implants and grafts, initial encounter: Secondary | ICD-10-CM | POA: Diagnosis not present

## 2014-05-09 LAB — GLUCOSE, CAPILLARY
GLUCOSE-CAPILLARY: 281 mg/dL — AB (ref 70–99)
Glucose-Capillary: 142 mg/dL — ABNORMAL HIGH (ref 70–99)
Glucose-Capillary: 179 mg/dL — ABNORMAL HIGH (ref 70–99)
Glucose-Capillary: 168 mg/dL — ABNORMAL HIGH (ref 70–99)

## 2014-05-09 LAB — BASIC METABOLIC PANEL
ANION GAP: 11 (ref 5–15)
BUN: 21 mg/dL (ref 6–23)
CALCIUM: 9.6 mg/dL (ref 8.4–10.5)
CO2: 26 meq/L (ref 19–32)
Chloride: 105 mEq/L (ref 96–112)
Creatinine, Ser: 1.04 mg/dL (ref 0.50–1.35)
GFR calc Af Amer: 80 mL/min — ABNORMAL LOW (ref >90)
GFR, EST NON AFRICAN AMERICAN: 69 mL/min — AB (ref >90)
GLUCOSE: 114 mg/dL — AB (ref 70–99)
Potassium: 4.4 mEq/L (ref 3.7–5.3)
Sodium: 142 mEq/L (ref 137–147)

## 2014-05-09 LAB — CBC
HCT: 38.2 % — ABNORMAL LOW (ref 39.0–52.0)
Hemoglobin: 12.3 g/dL — ABNORMAL LOW (ref 13.0–17.0)
MCH: 30.1 pg (ref 26.0–34.0)
MCHC: 32.2 g/dL (ref 30.0–36.0)
MCV: 93.4 fL (ref 78.0–100.0)
PLATELETS: 215 10*3/uL (ref 150–400)
RBC: 4.09 MIL/uL — ABNORMAL LOW (ref 4.22–5.81)
RDW: 14.8 % (ref 11.5–15.5)
WBC: 7.8 10*3/uL (ref 4.0–10.5)

## 2014-05-09 LAB — MRSA PCR SCREENING: MRSA by PCR: NEGATIVE

## 2014-05-09 LAB — TSH: TSH: 1.94 u[IU]/mL (ref 0.350–4.500)

## 2014-05-09 LAB — LIPID PANEL
CHOL/HDL RATIO: 4.2 ratio
Cholesterol: 123 mg/dL (ref 0–200)
HDL: 29 mg/dL — AB (ref >39)
LDL Cholesterol: 62 mg/dL (ref 0–99)
Triglycerides: 161 mg/dL — ABNORMAL HIGH (ref <150)
VLDL: 32 mg/dL (ref 0–40)

## 2014-05-09 LAB — HEMOGLOBIN A1C
HEMOGLOBIN A1C: 6.8 % — AB (ref <5.7)
MEAN PLASMA GLUCOSE: 148 mg/dL — AB (ref <117)

## 2014-05-09 LAB — PROTIME-INR
INR: 2.84 — AB (ref 0.00–1.49)
Prothrombin Time: 29.8 seconds — ABNORMAL HIGH (ref 11.6–15.2)

## 2014-05-09 MED ORDER — METOPROLOL SUCCINATE ER 50 MG PO TB24
75.0000 mg | ORAL_TABLET | Freq: Every day | ORAL | Status: DC
  Filled 2014-05-09: qty 1

## 2014-05-09 MED ORDER — METOPROLOL SUCCINATE ER 50 MG PO TB24
50.0000 mg | ORAL_TABLET | Freq: Once | ORAL | Status: AC
  Administered 2014-05-09: 50 mg via ORAL
  Filled 2014-05-09: qty 1

## 2014-05-09 MED ORDER — LISINOPRIL 2.5 MG PO TABS
2.5000 mg | ORAL_TABLET | Freq: Every day | ORAL | Status: DC
  Administered 2014-05-09 – 2014-05-11 (×3): 2.5 mg via ORAL
  Filled 2014-05-09 (×4): qty 1

## 2014-05-09 MED ORDER — ATORVASTATIN CALCIUM 80 MG PO TABS
80.0000 mg | ORAL_TABLET | Freq: Every day | ORAL | Status: DC
  Administered 2014-05-09 – 2014-05-16 (×8): 80 mg via ORAL
  Filled 2014-05-09 (×9): qty 1

## 2014-05-09 MED ORDER — WARFARIN SODIUM 7.5 MG PO TABS: 7.5000 mg | ORAL_TABLET | Freq: Once | ORAL | Status: DC

## 2014-05-09 MED ORDER — GLIMEPIRIDE 1 MG PO TABS
1.0000 mg | ORAL_TABLET | Freq: Two times a day (BID) | ORAL | Status: DC
  Administered 2014-05-09 – 2014-05-10 (×3): 1 mg via ORAL
  Filled 2014-05-09 (×6): qty 1

## 2014-05-09 NOTE — Progress Notes (Signed)
Report given to Okay, RN 2H. Pt transferred to [facility] via [facility] in stable condition; daughter at bedside and aware of transfer, some belongings sent via transporter and extra clothing sent home with daughter.

## 2014-05-09 NOTE — Progress Notes (Signed)
ANTICOAGULATION CONSULT NOTE  Pharmacy Consult for Coumadin Indication: St Jude's AVR  No Known Allergies  Patient Measurements: Height: 5\' 10"  (177.8 cm) Weight: 215 lb 9.8 oz (97.8 kg) IBW/kg (Calculated) : 73  Vital Signs: Temp: 98 F (36.7 C) (09/21 0755) Temp src: Oral (09/21 0755) BP: 138/51 mmHg (09/21 0400) Pulse Rate: 59 (09/21 0000)  Labs:  Recent Labs  05/07/14 2155 05/07/14 2205 05/08/14 0352 05/08/14 1128 05/09/14 0447  HGB 14.4  --   --   --  12.3*  HCT 43.2  --   --   --  38.2*  PLT 256  --   --   --  215  LABPROT  --  36.9* 39.2*  --  29.8*  INR  --  3.73* 4.03*  --  2.84*  CREATININE 1.12  --   --   --  1.04  TROPONINI <0.30  --  <0.30 <0.30  --     Estimated Creatinine Clearance: 74.2 ml/min (by C-G formula based on Cr of 1.04).   Medical History: Past Medical History  Diagnosis Date  . Diabetes mellitus 2007  . Hypertension   . Gout   . Hypercholesteremia   . Peripheral vascular disease     stents lower extremity  . Chronic pain     foot, hips  . Sleep apnea   . COPD (chronic obstructive pulmonary disease)   . 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 Chronic back pain   . Dysphagia   . Neuromuscular disorder   . Peripheral neuropathy   . Critical lower limb ischemia     Medications:  Prescriptions prior to admission  Medication Sig Dispense Refill  . albuterol (VOSPIRE ER) 4 MG 12 hr tablet Take 4 mg by mouth 2 (two) times daily.       [name] albuterol-ipratropium (COMBIVENT) 18-103 MCG/ACT inhaler Inhale 1 puff into the lungs 4 (four) times daily. Coughing/ Shortness of Breath      . allopurinol (ZYLOPRIM) 300 MG tablet Take 300 mg by mouth at bedtime.       [name] amLODipine (NORVASC) 5 MG tablet Take 5 mg by mouth every morning.       [name] aspirin EC 81 MG tablet Take 81 mg by mouth at bedtime.      . fenofibrate (TRICOR) 145 MG tablet Take 1 tablet (145 mg total) by mouth daily.  30 tablet  9  . fish oil-omega-3 fatty acids 1000  MG capsule Take 1 capsule (1 g total) by mouth 2 (two) times daily.      . furosemide (LASIX) 80 MG tablet Take 40 mg by mouth daily.      [name] glimepiride (AMARYL) 1 MG tablet Take 1 mg by mouth 2 (two) times daily.      . isosorbide mononitrate (IMDUR) 30 MG 24 hr tablet Take 1 tablet (30 mg total) by mouth daily.  30 tablet  11  . metoprolol succinate (TOPROL-XL) 50 MG 24 hr tablet Take 75 mg by mouth daily. Take with or immediately following a meal.      . oxyCODONE-acetaminophen (PERCOCET) 10-325 MG per tablet Take 1 tablet by mouth every 6 (six) hours as needed for pain.       . potassium chloride SA (K-DUR,KLOR-CON) 20 MEQ tablet Take 20 mEq by mouth daily.        . pregabalin (LYRICA) 50 MG capsule Take 50 mg by mouth 2 (two) times daily.       [name]  ramipril (ALTACE) 5 MG capsule Take 2.5-5 mg by mouth 2 (two) times daily. Take 1 tablet in the morning and 1/2 tablet at bedtime.      . rosuvastatin (CRESTOR) 20 MG tablet Take 20 mg by mouth at bedtime.      . sitaGLIPtin (JANUVIA) 50 MG tablet Take 50 mg by mouth daily.      [name] warfarin (COUMADIN) 5 MG tablet Take 5-7.5 mg by mouth at bedtime. Takes one and one-half tablet (7.5mg  total) on Mondays and Fridays. Takes one tablet (5mg  total) on all other days        Assessment: 73 yo M on chronic Coumadin for hx for St Jude's AVR.  Home dose listed above.  INR supra-therapeutic on admission, but back in goal range after dose held x2.  He has been maintained on this dose for >1 year with most INR checks within goal range. No bleeding noted.     Goal of Therapy:  INR 2.5-3.5   Plan:  Resume Coumadin home regimen- 7.5mg  po x1 dose today Daily INR  Biagio Borg 05/09/2014,8:14 AM

## 2014-05-09 NOTE — Consult Note (Signed)
CARDIOLOGY CONSULT NOTE   Patient ID: JAMESPAUL SECRIST MRN: [id number] DOB/AGE: [birth year] 73 y.o.  Admit Date: 05/07/2014 Referring Physician: PTH Primary Physician: [name], MD Consulting Cardiologist: Carlyle Dolly MD Primary Cardiologist: [name] Reason for Consultation: Chest Pain in pt with known CAD.  Clinical Summary Mr. Mcglory is a 73 y.o.male coronary artery disease, history of aortic stenosis, as well as peripheral vascular disease. In 1991 he underwent initial CABG revascularization surgery to his RCA and also underwent St. Jude's aortic valve replacement surgery for aortic valve stenosis. In 1994, on coumadin followed by [facility], he required redo CABG surgery to his left coronary system which was not bypassed in 1991. In 2005, a stent was placed in his RCA vein graft the patient has significant peripheral vascular disease and is status post intervention to both his right iliac and bilateral SFAs with Dr. [name] and has also undergone rotational atherectomy of his SFAs. Chronic LBBB.   He was in his usual state of health until 3 days ago when he tried to start his [employer]eler by stepping down hard on the ignition to retrieve a cow that had gotten out. He felt some substernal burning and pain. He ended up walking and still felt some soreness, but no associated dyspnea, diaphoresis or weakness. Pain subsided on its own after about an hour. . On Saturday evening after eating [employer] fried chicken dinner, he began to have recurrent substernal burning, with ache radiating up into his jaw, neck and down the inside of both arms. Some worsening of breathing, no diaphoresis. He decided to to ER for further evaluation.  In ER BP 185/78, HR 109. CXR Cardiomegaly with central venous congestion and left basilar atelectasis. LBBB with frequent PVC's. Cardiac enzymes are negative X 2. Potassium 4.4, creatinine 1.04, INR 2.04. He was treated with NTG gtt. This has been  d/c'd on assessment this am. BP is stable and he is currently pain free.   Other history includes mild to moderate carotid stenosis, a history of hypertension, ongoing tobacco use, type 2 diabetes mellitus, COPD as well as obstructive sleep apnea. A carotid Doppler study done in September 2013 showed moderate amount of fibrous plaque and right carotid internal stenosis of less than 49% both the right external carotid narrowing of 70-99%. In addition he did have elevated velocities in the left subclavian artery suggestive of 50-69% diameter and occlusive disease in the left vertebral artery. His left internal carotid revealed 50-69% of diameter reduction.  He missed his appt with Dr. [name] in July. He denies medical non-compliance but does admit to a good bit of dietary non-compliance. He quit smoking the day he came to ER.    No Known Allergies  Medications Scheduled Medications: . allopurinol  300 mg Oral QHS  . amLODipine  5 mg Oral q morning - 10a  . aspirin EC  81 mg Oral QHS  . atorvastatin  20 mg Oral q1800  . fenofibrate  160 mg Oral Daily  . furosemide  40 mg Oral Daily  . Influenza vac split quadrivalent PF  0.5 mL Intramuscular Tomorrow-1000  . insulin aspart  0-9 Units Subcutaneous TID WC  . ipratropium  0.5 mg Nebulization TID PC  . isosorbide mononitrate  45 mg Oral Daily  . levalbuterol  0.63 mg Nebulization TID  . metoprolol succinate  25 mg Oral BID  . omega-3 acid ethyl esters  1 g Oral BID  . potassium chloride SA  20 mEq Oral Daily  .  povidone-iodine   Topical Q1200  . pregabalin  50 mg Oral QHS  . ramipril  2.5 mg Oral BID  . warfarin  7.5 mg Oral Once  . Warfarin - Pharmacist Dosing Inpatient   Does not apply Q24H       PRN Medications: acetaminophen, gi cocktail, nitroGLYCERIN, ondansetron (ZOFRAN) IV, oxyCODONE, oxyCODONE-acetaminophen   Past Medical History  Diagnosis Date  . Diabetes mellitus 2007  . Hypertension   . Gout   . Hypercholesteremia   .  Peripheral vascular disease     stents lower extremity  . Chronic pain     foot, hips  . Sleep apnea   . COPD (chronic obstructive pulmonary disease)   . 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 Chronic back pain   . Dysphagia   . Neuromuscular disorder   . Peripheral neuropathy   . Critical lower limb ischemia     Past Surgical History  Procedure Laterality Date  . Back surgery  [id number],[id number]    2  . Open heart surgery  1990    prosthetic heart valve, one bypass  . Rotator cuff repair      right  . Cataract extraction      bilateral  . Coronary stent placement  2005    RCA vein graft A 3.0x13.0 TAXUS stent was then placed int he vessel a Viva 3.0x4.0 (perfusion balloon was made ready it was placed through the entire lenght of the stent  . Peripheral vascular procedures lower extremities      right external iliac  artery PTA and stenting as well as bilateral SFA intervention remotely. Repeat procedures in 2011 bilaterally  . Coronary artery bypass graft  1994    6 vessels  . [name]  06/13/2011    Procedure: Venia Minks DILATION;  Surgeon: [name], MD;  Location: [facility];  Service: Endoscopy;  Laterality: N/A;  Dilated to 56.   . Angioplasty illiac artery      Family History  Problem Relation Age of Onset  . Colon cancer Neg Hx   . Liver disease Neg Hx     Social History Mr. Nasworthy reports that he has been smoking Cigarettes.  He started smoking about 60 years ago. He has a 55 pack-year smoking history. He quit smokeless tobacco use about 18 years ago. His smokeless tobacco use included Chew. Mr. Wurzer reports that he drinks alcohol.  Review of Systems Otherwise reviewed and negative except as outlined.  Physical Examination Blood pressure 138/51, pulse 59, temperature 98 F (36.7 C), temperature source Oral, resp. rate 15, height 5\' 10"  (1.778 m), weight 215 lb 9.8 oz (97.8 kg), SpO2 95.00%.  Intake/Output Summary (Last 24 hours) at 05/09/14  0817 Last data filed at 05/09/14 0755  Gross per 24 hour  Intake      0 ml  Output   2400 ml  Net  -2400 ml    Telemetry: NSR, with LBBB, frequent PVC's rates in the 50's.   GEN:No acute distress.  HEENT: Conjunctiva and lids normal, oropharynx clear with moist mucosa. Neck: Supple, no elevated JVP or carotid bruits, no thyromegaly. Lungs: Some crackles in the bases without wheezes.  Cardiac: Irregular rate and rhythm, bradycardic crisp click of prosthetic valve at the apex. Abdomen: Soft, nontender, no hepatomegaly, bowel sounds present, no guarding or rebound. Extremities: No pitting edema, distal pulses diminished. Discoloration to the left foot and toes, with pain at the left great toe and nail destruction.  Discoloration of the right foot less than left. Ulcer to the left great toe. Skin: Warm and dry. Musculoskeletal: No kyphosis. Neuropsychiatric: Alert and oriented x3, affect grossly appropriate.  Prior Cardiac Testing/Procedures Echocardiogram 05/08/2014 Left ventricle: The cavity size was at the upper limits of normal. Wall thickness was increased in a pattern of moderate LVH. Systolic function was mildly reduced. The estimated ejection fraction was in the range of 45% to 50%. Left ventricular diastolic function parameters were normal. - Aortic valve: A mechanical prosthesis was present. There was moderate stenosis. Valve area (Vmax): 4.01 cm^2. - Left atrium: The atrium was moderately dilated. - Right atrium: The atrium was moderately dilated.  NM Stress Test 08/13/2013 SPECT: No stress-induced perfusion defect. There is a large fixed  defect involving the inferior wall extending into the apex and  anteroseptal regions.  Wall motion: Severe global hypokinesis.  Ejection fraction: 30%. End-diastolic volume 885 cc. End systolic  volume 027 cc.  IMPRESSION:  No stress-induced ischemia. Fixed defect as described involving the  inferior wall extending into the apex and  anteroseptal region.  Lower extremity Doppler Ultrasound 01/27/2013  1. Left ABI of 0.66 indicates persistent moderate, and likely  multilevel peripheral arterial disease in the left lower extremity.  No detectable wave forms were identified in the digits.  2. Slightly decreased right ABI of 0.91 indicates mild right lower  extremity peripheral vascular disease.  Abdominal Aortography with bilateral run-off 02/15/2013 ANGIOGRAPHIC RESULTS:  1: Distal abdominal aortogram/right lateral iliac angiogram: The iliac arteries widely patent, in particular the right external iliac artery stent was widely patent.  2: Left lower extremity-widely patent left SFA stent. There was moderate calcific disease below the stented segment in the adductor canal and above-knee popliteal artery. There is 1 vessel runoff via the posterior tibial which was diffusely diseased. The anterior tibial and peroneal were occluded. The posterior tibial terminated at the ankle. This appeared to be unchanged compared to prior in angiograms done 3 years ago.  3: Right lower extremity-the stents in the right SFA were widely patent. There was moderate calcific disease in the distal SFA/above-the-knee popliteal but did not appear to be obstructive. There was one vessel runoff via posterior tibial.   IMPRESSION:Mr. Lippold's anatomy is unchanged from his prior angina then performed because 11. SFA stents are patent. He has severe infrapopliteal/tibial disease. The ulcer on his left great toe is in the anterior tibial territory which was noted to be occluded from many years. His peroneals occluded and his posterior tibial terminates above the ankle. I do not think he has a good endovascular solution to promote healing. IF this does not heal with aggressive local care he may require amputation.   Lab Results  Basic Metabolic Panel:  Recent Labs Lab 05/07/14 2155 05/08/14 0348 05/09/14 0447  NA 141  --  142  K 4.1  --  4.4  CL 101  --   105  CO2 24  --  26  GLUCOSE 168*  --  114*  BUN 23  --  21  CREATININE 1.12  --  1.04  CALCIUM 10.2  --  9.6  MG  --  2.0  --     Liver Function Tests:  Recent Labs Lab 05/07/14 2155  AST 31  ALT 17  ALKPHOS 32*  BILITOT 0.6  PROT 7.9  ALBUMIN 4.4    CBC:  Recent Labs Lab 05/07/14 2155 05/09/14 0447  WBC 9.2 7.8  NEUTROABS 5.3  --   HGB 14.4  12.3*  HCT 43.2 38.2*  MCV 90.9 93.4  PLT 256 215    Cardiac Enzymes:  Recent Labs Lab 05/07/14 2155 05/08/14 0352 05/08/14 1128  TROPONINI <0.30 <0.30 <0.30   Radiology: Dg Chest Port 1 View  05/07/2014   CLINICAL DATA:  Worsening chest pain and shortness breath  EXAM: PORTABLE CHEST - 1 VIEW  COMPARISON:  None.  FINDINGS: Sternotomy wires overlie the stable enlarged cardiac silhouette. There is chronic bronchitic markings and mild central venous congestion. Left basilar atelectasis similar prior.  IMPRESSION: 1. No interval change. 2. Cardiomegaly, central venous congestion and left basilar atelectasis.   Electronically Signed   By: Suzy Bouchard M.D.   On: 05/07/2014 21:43     ECG: NSR with LBBB and frequent PVC's.    Impression and Recommendations  1. Chest Pain with known CAD: Pain reminiscient of angina pain for this patient in the past. He is feeling much better since admission. NTG is turned off without recurrence of pain. . Cardiac markers are normal with no changes in EKG with the exception of frequent ventricular ectopy.This argues against ACS currently.  Last stress test in December of 2014 did not reveal inducible ischemia but did have  a large fixed defect involving the inferior wall extending into the apex and anteroseptal regions.   He denies medical non-compliance with lasix or other cardiac medications. Hx of this in the past. Potassium is normal. Echo 05/08/2014  demonstrates EF of 45%-50% unchanged since March of 2014. Can consider repeat stress test for evaluation of progressive CAD. Would  continue nitrates, ASA, BB, restart ACE as he was taking at home. He is on fenofibrate here, but not on Crestor as he is listed on recent office visit with Dr. [name]. Would restart and check fasting lipids and LFTs.   2. S/P St Jude Mechanical Aortic  Valve; Continue coumadin therapy. Good crisp sound on auscultation.Recent echo demonstrated moderate stenosis.   3. PVD: Recent Aortogram with run-off June of 2014 demonstrated no change in anatomy. Patent stents. Consideration for amputation of the left great toe if ulcer does not heal. Continue current medications.   4. COPD  5. Tobacco abuse:Multiple conversations in the past by records on smoking cessation. He states he quit on Sat when he came in when he was having chest pain.                              Signed: Phill Myron. Lawrence NP  05/09/2014, 8:17 AM Co-Sign MD  Patient seen and discussed with NP Purcell Nails. 73 yo male hx of CAD with CABG in 1991 with redo 1994 and stent to RCA SVG in 2005, aortic stenosis s/p St Jude AVR, PAD w/ prior interventions to right iliac and bilateral SFAs, chronic LBBB, HTN, tobacco abuse, DM2, COPD, OSA, admitted with chest pain.  - Last cath 05/2004 LM normal, LCX occluded midportion, LAD occluded, RCA occluded. Sequential SVG to OM1,2,3 and LCX patent. SVG-RCA 95% lesion that was stented with DES, LIMA-LAD patent. There was blushing of contrast at the SVG-RCA stent concerning for perforation which was treated with balloon inflation with apparent resolution - 05/08/14 echo: LVEF 45-50%, moderate AS  - EKG chronic LBBB with PVCs, trop neg x3.  - 07/2013 Lexiscan MPI: LVEF 30%, fixed defect inferior wall, no ischemia 12/2012 MPI mild to mod infero/inferolateral peri-infarct ischemia.   Originally started on NG gtt for pain control, now off. Not on hep gtt  due to therapeutic INR on coumadin.  He describes approx 1 year history of intermittent chest. Reports recently increased in frequency and severity over the  last month, with episode Saturday night rated 9/10 burning in his chest radiating to neck and both arms. Episode on Saturday night most intense he has had.   Overall he has very strong CAD history, he has a 1 year history of fairly mixed chest pain symptoms however they are progressing in severity and frequency. Mixed stress test results 12/2012 and 07/2013, symptoms continue to progress since that time. Chronic LBBB cannot interpret for ischemia on EKG, trops negative. Frequent PVCs on telemetry, 3-4 beat runs of NSVT. Last cath 2005. With combined findings there is concern for underlying ischemia, needs definitive evaluation with heart cath. Will arrange transfer to [facility], will need to wait for INR to decrease, will need heparin bridging on heparin gtt.    Zandra Abts MD

## 2014-05-09 NOTE — Progress Notes (Signed)
[facility] PROGRESS NOTE  Tanner Pena QJF:[id number] DOB: 08-13-1941 DOA: 05/07/2014 PCP: [name], MD    Code Status: Full code Family Communication: Discussed with patient; family not available Disposition Plan: Discharge when clinically appropriate   Consultants:  Cardiology pending  Procedures: Echocardiogram 9/20:Study Conclusions - Left ventricle: The cavity size was at the upper limits of normal. Wall thickness was increased in a pattern of moderate LVH. Systolic function was mildly reduced. The estimated ejection fraction was in the range of 45% to 50%. Left ventricular diastolic function parameters were normal. - Aortic valve: A mechanical prosthesis was present. There was moderate stenosis. Valve area (Vmax): 4.01 cm^2. - Left atrium: The atrium was moderately dilated. - Right atrium: The atrium was moderately dilated.    Antibiotics:  None   HPI/Subjective: The patient is sitting up in the chair. He has no complaints of chest pain or shortness of breath at rest. He does acknowledge palpitations frequently.  Objective: Filed Vitals:   05/09/14 0900  BP: 125/46  Pulse:   Temp:   Resp:   Temperature 90.8. Pulse 59. Respiratory rate 15. Blood pressure 125/46. Oxygen saturation 95%.    Intake/Output Summary (Last 24 hours) at 05/09/14 0914 Last data filed at 05/09/14 0830  Gross per 24 hour  Intake      0 ml  Output   2700 ml  Net  -2700 ml   Filed Weights   05/07/14 2347 05/08/14 0500 05/09/14 0500  Weight: 97.3 kg (214 lb 8.1 oz) 97.3 kg (214 lb 8.1 oz) 97.8 kg (215 lb 9.8 oz)    Exam:   General:  73 year old Caucasian man in no apparent distress.  Cardiovascular: S1, S2, with frequent ectopy, bradycardia and a 2/6  systolic murmur and a S2 click  Respiratory: occasional crackles/wheezes in the bases and mid lobes, almost completely cleared with coughing.  Abdomen: positive bowel sounds, soft, nontender,  nondistended.  Musculoskeletal/extremities: Multiple varicosities in both legs ; trace of pedal edema on the left greater than right; evidence of vein harvesting bilaterally. Pedal pulses barely palpable.  Skin: Excoriated lesions on toes on both feet without erythema; multiple varicosities of both legs.  Neurologic: He is alert and oriented x3. Cranial nerves II through XII are grossly intact.  Data Reviewed: Basic Metabolic Panel:  Recent Labs Lab 05/07/14 2155 05/08/14 0348 05/09/14 0447  NA 141  --  142  K 4.1  --  4.4  CL 101  --  105  CO2 24  --  26  GLUCOSE 168*  --  114*  BUN 23  --  21  CREATININE 1.12  --  1.04  CALCIUM 10.2  --  9.6  MG  --  2.0  --    Liver Function Tests:  Recent Labs Lab 05/07/14 2155  AST 31  ALT 17  ALKPHOS 32*  BILITOT 0.6  PROT 7.9  ALBUMIN 4.4   No results found for this basename: LIPASE, AMYLASE,  in the last 168 hours No results found for this basename: AMMONIA,  in the last 168 hours CBC:  Recent Labs Lab 05/07/14 2155 05/09/14 0447  WBC 9.2 7.8  NEUTROABS 5.3  --   HGB 14.4 12.3*  HCT 43.2 38.2*  MCV 90.9 93.4  PLT 256 215   Cardiac Enzymes:  Recent Labs Lab 05/07/14 2155 05/08/14 0352 05/08/14 1128  TROPONINI <0.30 <0.30 <0.30   BNP (last 3 results)  Recent Labs  08/10/13 1841 01/19/14 2042  PROBNP 784.4* 1373.0*  CBG:  Recent Labs Lab 05/08/14 0727 05/08/14 1121 05/08/14 1615 05/08/14 2143 05/09/14 0727  GLUCAP 106* 159* 176* 151* 142*    Recent Results (from the past 240 hour(s))  MRSA PCR SCREENING     Status: None   Collection Time    05/08/14 12:06 AM      Result Value Ref Range Status   MRSA by PCR NEGATIVE  NEGATIVE Final   Comment:            The GeneXpert MRSA Assay (FDA     approved for NASAL specimens     only), is one component of a     comprehensive MRSA colonization     surveillance program. It is not     intended to diagnose MRSA     infection nor to guide or      monitor treatment for     MRSA infections.     Studies: Dg Chest Port 1 View  05/07/2014   CLINICAL DATA:  Worsening chest pain and shortness breath  EXAM: PORTABLE CHEST - 1 VIEW  COMPARISON:  None.  FINDINGS: Sternotomy wires overlie the stable enlarged cardiac silhouette. There is chronic bronchitic markings and mild central venous congestion. Left basilar atelectasis similar prior.  IMPRESSION: 1. No interval change. 2. Cardiomegaly, central venous congestion and left basilar atelectasis.   Electronically Signed   By: Suzy Bouchard M.D.   On: 05/07/2014 21:43    Scheduled Meds: . allopurinol  300 mg Oral QHS  . amLODipine  5 mg Oral q morning - 10a  . aspirin EC  81 mg Oral QHS  . atorvastatin  20 mg Oral q1800  . fenofibrate  160 mg Oral Daily  . furosemide  40 mg Oral Daily  . Influenza vac split quadrivalent PF  0.5 mL Intramuscular Tomorrow-1000  . insulin aspart  0-9 Units Subcutaneous TID WC  . ipratropium  0.5 mg Nebulization TID PC  . isosorbide mononitrate  45 mg Oral Daily  . levalbuterol  0.63 mg Nebulization TID  . metoprolol succinate  25 mg Oral BID  . omega-3 acid ethyl esters  1 g Oral BID  . potassium chloride SA  20 mEq Oral Daily  . povidone-iodine   Topical Q1200  . pregabalin  50 mg Oral QHS  . ramipril  2.5 mg Oral BID  . warfarin  7.5 mg Oral Once  . Warfarin - Pharmacist Dosing Inpatient   Does not apply Q24H   Continuous Infusions:   Assessment and plan:  Principal Problem:   Unstable angina Active Problems:   H/O aortic valve replacement- St Jude   Cardiomyopathy, ischemic - EF 25-30% by echo 5/14   PVD prior SFA PTA with chronic LE disease, not amenable to PTA   LBBB (left bundle branch block)   Warfarin-induced coagulopathy   HTN (hypertension)   PVC's (premature ventricular contractions)   Long term (current) use of anticoagulants   CAD - CABG '90 with re do '94    Tobacco abuse   DM2 (diabetes mellitus, type 2)   Carotid stenosis    Hyperlipidemia with target LDL less than 70   Chest pain on admission MI r/o- Myoview negative 08/13/13   COPD (chronic obstructive pulmonary disease)    1. Chest pain, presumed to be unstable angina.  He is chest pain-free now. He received nitroglycerin drip following admission, discontinued on 9/20. Isosorbide mononitrate increased to 45 mg daily. His cardiac enzyme troponin I. was  negative x2. His EKG  following admission revealed frequent PVCs and lateral T wave abnormalities. His magnesium level was  within normal limits. His potassium level is within normal limits. C 6 ardiology consult pending. Coronary artery disease with a history of CABG x2 and coronary stent placement in 2005.  We'll continue medical therapy with beta blocker, statin, anticoagulant, ACE inhibitor, and nitrate. isosorbide mononitrate titrated up to 45 mg daily which could be titrated further to 60. Ischemic cardiomyopathy.  His current 2-D echocardiogram reveals an ejection fraction of 45-50%, improved from the echo in May 2014.There is vascular congestion on the chest x-ray but no pulmonary edema. He has some mild lower extremity edema, but he attributes this to previous vein harvesting. Few crackles/wheezes heard on exam were cleared with coughing indicating more of a chronic bronchitis. We'll continue medical therapy with Lasix, beta blocker, and ACE inhibitor. TSH pending. PVCs./arrhythmia.  Followup EKG pending. He continues to have ectopy auscultated on exam. He does acknowledge palpitations. Oral albuterol was discontinued and the albuterol neb was discontinued in favor of Xopenex. The patient's EKG on 9/20  revealed AV block with frequent PVCs. Compared to the EKG in June 2015, there was evidence of occasional PVC and left bundle branch block on the June EKG. His magnesium level and potassium levels are within normal limits. We'll continue to monitor. We'll await cardiology's assessment and recommendations. History of  aortic valve replacement-St. Jude with chronic anti-coagulation with warfarin. His INR is supratherapeutic. Pharmacy has been consulted to assist with management. 2-D echocardiogram reveals active prosthetic aortic valve and moderate stenosis. Hypertension. Currently stable on beta blocker and ACE inhibitor. Hyperlipidemia. We'll continue statin and fibrate. Will order a fasting lipid profile ordered. COPD with ongoing smoking. We'll continue bronchodilators, but oral albuterol was discontinued because of the PVCs and albuterol neb changed to Xopenex. The patient was advised to stop smoking completely. Diabetes mellitus, type II with peripheral vascular disease.  Currently reasonable CBGs.We'll continue to  hold his oral hypoglycemic agents, metformin and Januvia. We'll continue to  treat with sliding scale NovoLog. Hemoglobin A1c and TSH are pending.      Time spent: 35 minutes.    [facility] Pager [phone]. If 7PM-7AM, please contact night-coverage at [email or web address], password [facility] 05/09/2014, 9:14 AM  LOS: 2 days

## 2014-05-10 LAB — CBC
HEMATOCRIT: 37.8 % — AB (ref 39.0–52.0)
HEMOGLOBIN: 12.4 g/dL — AB (ref 13.0–17.0)
MCH: 29.9 pg (ref 26.0–34.0)
MCHC: 32.8 g/dL (ref 30.0–36.0)
MCV: 91.1 fL (ref 78.0–100.0)
Platelets: 207 10*3/uL (ref 150–400)
RBC: 4.15 MIL/uL — ABNORMAL LOW (ref 4.22–5.81)
RDW: 14.7 % (ref 11.5–15.5)
WBC: 7.3 10*3/uL (ref 4.0–10.5)

## 2014-05-10 LAB — HEPARIN LEVEL (UNFRACTIONATED): HEPARIN UNFRACTIONATED: 0.13 [IU]/mL — AB (ref 0.30–0.70)

## 2014-05-10 LAB — BASIC METABOLIC PANEL
ANION GAP: 9 (ref 5–15)
BUN: 29 mg/dL — AB (ref 6–23)
CALCIUM: 9.4 mg/dL (ref 8.4–10.5)
CHLORIDE: 104 meq/L (ref 96–112)
CO2: 27 mEq/L (ref 19–32)
CREATININE: 1.05 mg/dL (ref 0.50–1.35)
GFR calc Af Amer: 79 mL/min — ABNORMAL LOW (ref >90)
GFR calc non Af Amer: 68 mL/min — ABNORMAL LOW (ref >90)
GLUCOSE: 123 mg/dL — AB (ref 70–99)
Potassium: 4.5 mEq/L (ref 3.7–5.3)
Sodium: 140 mEq/L (ref 137–147)

## 2014-05-10 LAB — GLUCOSE, CAPILLARY
Glucose-Capillary: 152 mg/dL — ABNORMAL HIGH (ref 70–99)
Glucose-Capillary: 182 mg/dL — ABNORMAL HIGH (ref 70–99)
Glucose-Capillary: 180 mg/dL — ABNORMAL HIGH (ref 70–99)
Glucose-Capillary: 203 mg/dL — ABNORMAL HIGH (ref 70–99)

## 2014-05-10 LAB — PROTIME-INR
INR: 2.01 — ABNORMAL HIGH (ref 0.00–1.49)
PROTHROMBIN TIME: 22.8 s — AB (ref 11.6–15.2)

## 2014-05-10 LAB — PRO B NATRIURETIC PEPTIDE: PRO B NATRI PEPTIDE: 820.3 pg/mL — AB (ref 0–125)

## 2014-05-10 MED ORDER — SODIUM CHLORIDE 0.9 % IJ SOLN: 3.0000 mL | INTRAMUSCULAR | Status: DC | PRN

## 2014-05-10 MED ORDER — ASPIRIN 81 MG PO CHEW
81.0000 mg | CHEWABLE_TABLET | ORAL | Status: AC
  Administered 2014-05-11: 81 mg via ORAL

## 2014-05-10 MED ORDER — METOPROLOL SUCCINATE ER 50 MG PO TB24
50.0000 mg | ORAL_TABLET | Freq: Every day | ORAL | Status: DC
  Administered 2014-05-10 – 2014-05-11 (×2): 50 mg via ORAL
  Filled 2014-05-10 (×2): qty 1

## 2014-05-10 MED ORDER — HEPARIN (PORCINE) IN NACL 100-0.45 UNIT/ML-% IJ SOLN
1500.0000 [IU]/h | INTRAMUSCULAR | Status: DC
  Administered 2014-05-10: 1200 [IU]/h via INTRAVENOUS
  Administered 2014-05-11: 1500 [IU]/h via INTRAVENOUS
  Filled 2014-05-10 (×4): qty 250

## 2014-05-10 MED ORDER — SODIUM CHLORIDE 0.9 % IJ SOLN: 3.0000 mL | Freq: Two times a day (BID) | INTRAMUSCULAR | Status: DC

## 2014-05-10 MED ORDER — SODIUM CHLORIDE 0.9 % IV SOLN
INTRAVENOUS | Status: DC
  Administered 2014-05-11: 12:00:00 via INTRAVENOUS

## 2014-05-10 MED ORDER — SODIUM CHLORIDE 0.9 % IV SOLN: INTRAVENOUS | Status: DC

## 2014-05-10 MED ORDER — SODIUM CHLORIDE 0.9 % IV SOLN: 250.0000 mL | INTRAVENOUS | Status: DC | PRN

## 2014-05-10 NOTE — Care Management Note (Addendum)
  Page 1 of 1   05/17/2014     3:18:14 PM CARE MANAGEMENT NOTE 05/17/2014  Patient:  Tanner Pena, Tanner Pena   Account Number:  [id number]  Date Initiated:  05/10/2014  Documentation initiated by:  Elissa Hefty  Subjective/Objective Assessment:   adm w angina     Action/Plan:   lives alone, pcp dr l fusco   Anticipated DC Date:  05/18/2014   Anticipated DC Plan:  HOME/SELF CARE         Choice offered to / List presented to:             Status of service:  Completed, signed off Medicare Important Message given?  YES (If response is "NO", the following Medicare IM given date fields will be blank) Date Medicare IM given:  05/16/2014 Medicare IM given by:  Beatris Belen Date Additional Medicare IM given:  05/13/2014 Additional Medicare IM given by:  [facility] D[facility]LL  Discharge Disposition:  HOME/SELF CARE  Per UR Regulation:  Reviewed for med. necessity/level of care/duration of stay  If discussed at [place] of Stay Meetings, dates discussed:   05/12/2014  05/17/2014    Comments:  Mariann Laster RN, BSN, MSHL, CCM  Nurse - Case Manager,  ([facility])  [phone]  05/17/2014 Plan:  d/c when INR above 2.0 - currently 1.7 PT recs:  none Dispo Plan:  home / self care

## 2014-05-10 NOTE — Progress Notes (Signed)
PROGRESS NOTE  Subjective:   73 yo with hx of CAD , AVR  Admitted in transfer from [facility] with symptoms of unstable angina, Similar to his previous episodes of angina. The episode was preceded on one occasion by a greasy chicken dinner. He states that symptoms were very somewhat to his previous episodes of angina. He did not try nitroglycerin because he had run out.  Objective:    Vital Signs:   Temp:  [97.4 F (36.3 C)-98.7 F (37.1 C)] 97.4 F (36.3 C) (09/22 0800) Pulse Rate:  [36-68] 48 (09/22 0800) Resp:  [14-26] 16 (09/22 0800) BP: (118-155)/(20-91) 135/43 mmHg (09/22 0800) SpO2:  [83 %-98 %] 96 % (09/22 0800) Weight:  [212 lb 4.9 oz (96.3 kg)] 212 lb 4.9 oz (96.3 kg) (09/21 1330)  Last BM Date: 05/09/14   24-hour weight change: Weight change: -3 lb 4.9 oz (-1.5 kg)  Weight trends: Filed Weights   05/08/14 0500 05/09/14 0500 05/09/14 1330  Weight: 214 lb 8.1 oz (97.3 kg) 215 lb 9.8 oz (97.8 kg) 212 lb 4.9 oz (96.3 kg)    Intake/Output:  09/21 0701 - 09/22 0700 In: 120 [P.O.:120] Out: 1600 [Urine:1600] Total I/O In: -  Out: 200 [Urine:200]   Physical Exam: BP 135/43  Pulse 48  Temp(Src) 97.4 F (36.3 C) (Oral)  Resp 16  Ht 5\' 10"  (1.778 m)  Wt 212 lb 4.9 oz (96.3 kg)  BMI 30.46 kg/m2  SpO2 96%  Wt Readings from Last 3 Encounters:  05/09/14 212 lb 4.9 oz (96.3 kg)  01/19/14 212 lb (96.163 kg)  11/25/13 220 lb 11.2 oz (100.109 kg)    General: Vital signs reviewed and noted.   Head: Normocephalic, atraumatic.  Eyes: conjunctivae/corneas clear.  EOM's intact.   Throat: normal  Neck:  normal   Lungs:    clear  Heart:  Irreg. Mechanical S2  Abdomen:  Soft, non-tender, non-distended    Extremities: Trace edema on right, 1+ on left.    Neurologic: A&O X3, CN II - XII are grossly intact.   Psych: Normal     Labs: BMET:  Recent Labs  05/07/14 2155 05/08/14 0348 05/09/14 0447 05/10/14 0229  NA 141  --  142 140  K 4.1  --  4.4 4.5  CL  101  --  105 104  CO2 24  --  26 27  GLUCOSE 168*  --  114* 123*  BUN 23  --  21 29*  CREATININE 1.12  --  1.04 1.05  CALCIUM 10.2  --  9.6 9.4  MG  --  2.0  --   --     Liver function tests:  Recent Labs  05/07/14 2155  AST 31  ALT 17  ALKPHOS 32*  BILITOT 0.6  PROT 7.9  ALBUMIN 4.4   No results found for this basename: LIPASE, AMYLASE,  in the last 72 hours  CBC:  Recent Labs  05/07/14 2155 05/09/14 0447 05/10/14 0229  WBC 9.2 7.8 7.3  NEUTROABS 5.3  --   --   HGB 14.4 12.3* 12.4*  HCT 43.2 38.2* 37.8*  MCV 90.9 93.4 91.1  PLT 256 215 207    Cardiac Enzymes:  Recent Labs  05/07/14 2155 05/08/14 0352 05/08/14 1128  TROPONINI <0.30 <0.30 <0.30    Coagulation Studies:  Recent Labs  05/07/14 2205 05/08/14 0352 05/09/14 0447 05/10/14 0229  LABPROT 36.9* 39.2* 29.8* 22.8*  INR 3.73* 4.03* 2.84* 2.01*    Other: No  components found with this basename: POCBNP,  No results found for this basename: DDIMER,  in the last 72 hours  Recent Labs  05/09/14 0447  HGBA1C 6.8*    Recent Labs  05/09/14 0530  CHOL 123  HDL 29*  LDLCALC 62  TRIG 161*  CHOLHDL 4.2    Recent Labs  05/09/14 0446  TSH 1.940   No results found for this basename: VITAMINB12, FOLATE, FERRITIN, TIBC, IRON, RETICCTPCT,  in the last 72 hours   Other results:  EKG :  NSR, LBBB Telemetry: Normal sinus rhythm. He is frequent premature ventricular contractions. Medications:    Infusions:    Scheduled Medications: . allopurinol  300 mg Oral QHS  . amLODipine  5 mg Oral q morning - 10a  . aspirin EC  81 mg Oral QHS  . atorvastatin  80 mg Oral q1800  . fenofibrate  160 mg Oral Daily  . furosemide  40 mg Oral Daily  . glimepiride  1 mg Oral BID WC  . insulin aspart  0-9 Units Subcutaneous TID WC  . ipratropium  0.5 mg Nebulization TID PC  . isosorbide mononitrate  45 mg Oral Daily  . levalbuterol  0.63 mg Nebulization TID  . lisinopril  2.5 mg Oral Daily  .  metoprolol succinate  75 mg Oral Daily  . omega-3 acid ethyl esters  1 g Oral BID  . potassium chloride SA  20 mEq Oral Daily  . povidone-iodine   Topical Q1200  . pregabalin  50 mg Oral QHS    Assessment/ Plan:   Principal Problem:   Unstable angina Active Problems:   Long term (current) use of anticoagulants   H/O aortic valve replacement- [employer]   CAD - CABG '90 with re do '94    Tobacco abuse   PVD prior SFA PTA with chronic LE disease, not amenable to PTA   DM2 (diabetes mellitus, type 2)   Carotid stenosis   Hyperlipidemia with target LDL less than 70   Chest pain on admission MI r/o- Myoview negative 08/13/13   Cardiomyopathy, ischemic - EF 25-30% by echo 5/14   COPD (chronic obstructive pulmonary disease)   LBBB (left bundle branch block)   Warfarin-induced coagulopathy   HTN (hypertension)   PVC's (premature ventricular contractions)  1. Unstable angina: The patient has a history of coronary artery disease. If symptoms that were consistent with angina. He started with exertion and seemed to worsen as he walked around. His troponin levels are negative. He's currently pain-free.  He was originally scheduled for cardiac catheterization but given the fact that his INR is greater than 2, we will delay the procedure until tomorrow. He'll be started on heparin for his mechanical aortic valve as we allow his Coumadin to drift down.  2. Aortic stenosis: Patient has a mechanical aortic valve. It sounds nice and crisp.  INR is 2.01.  Will delay cath until tomorrow.   3. Hyperlipidemia:  He is on atorvastatin 80 mg a day  4. Diabetes mellitus: Continue with his current diabetic medications  5. Bradycardia :  He is metoprolol 75 mg a day. We'll reduce his dose to 50 mg a day.  THS is ok.   Disposition:  Length of Stay: 3  Thayer Headings, Brooke Bonito., MD, [facility] 05/10/2014, 8:33 AM Office [phone] Pager [phone]

## 2014-05-10 NOTE — Progress Notes (Signed)
ANTICOAGULATION CONSULT NOTE - Initial Consult  Pharmacy Consult for heparin Indication: chest pain/ACS  No Known Allergies  Patient Measurements: Height: 5\' 10"  (177.8 cm) Weight: 212 lb 4.9 oz (96.3 kg) IBW/kg (Calculated) : 73 Heparin Dosing Weight: 93 kg  Vital Signs: Temp: 97.5 F (36.4 C) (09/22 1200) Temp src: Oral (09/22 1200) BP: 127/39 mmHg (09/22 1300) Pulse Rate: 58 (09/22 1300)  Labs:  Recent Labs  05/07/14 2155  05/08/14 0352 05/08/14 1128 05/09/14 0447 05/10/14 0229  HGB 14.4  --   --   --  12.3* 12.4*  HCT 43.2  --   --   --  38.2* 37.8*  PLT 256  --   --   --  215 207  LABPROT  --   < > 39.2*  --  29.8* 22.8*  INR  --   < > 4.03*  --  2.84* 2.01*  CREATININE 1.12  --   --   --  1.04 1.05  TROPONINI <0.30  --  <0.30 <0.30  --   --   < > = values in this interval not displayed.  Estimated Creatinine Clearance: 72.9 ml/min (by C-G formula based on Cr of 1.05).   Medical History: Past Medical History  Diagnosis Date  . Diabetes mellitus 2007  . Hypertension   . Gout   . Hypercholesteremia   . Peripheral vascular disease     stents lower extremity  . Chronic pain     foot, hips  . Sleep apnea   . COPD (chronic obstructive pulmonary disease)   . 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 Chronic back pain   . Dysphagia   . Neuromuscular disorder   . Peripheral neuropathy   . Critical lower limb ischemia     Medications:  Scheduled:  . allopurinol  300 mg Oral QHS  . amLODipine  5 mg Oral q morning - 10a  . aspirin EC  81 mg Oral QHS  . atorvastatin  80 mg Oral q1800  . fenofibrate  160 mg Oral Daily  . furosemide  40 mg Oral Daily  . glimepiride  1 mg Oral BID WC  . insulin aspart  0-9 Units Subcutaneous TID WC  . ipratropium  0.5 mg Nebulization TID PC  . isosorbide mononitrate  45 mg Oral Daily  . levalbuterol  0.63 mg Nebulization TID  . lisinopril  2.5 mg Oral Daily  . metoprolol succinate  50 mg Oral Daily  . omega-3  acid ethyl esters  1 g Oral BID  . potassium chloride SA  20 mEq Oral Daily  . povidone-iodine   Topical Q1200  . pregabalin  50 mg Oral QHS   Infusions:  . heparin 1,200 Units/hr (05/10/14 1225)    Assessment: 73 yo m admitted on 9/19 for chest pain and dyspnea x 2-3 days.  Patient is s/p CABG to RCA and then aortic valve replacement surgery for aortic stenosis in 1991 on warfarin 7.5 mg on Monday and Friday then 5 mg on all other days of the week PTA.  Patient is also s/p redo CABG to left coronary system in 1991.  In 2005, patient received a stent to his RCA graft.  Patient also has a history of peripheral vascular disease and is s/p intervention to both the right iliac and bilateral SFAs.  Patient is scheduled for cath tomorrow. Pharmacy is consulted to begin heparin.  INR is 2.01 today, hgb 12.4, plts 207, no s/s of  bleeding.  Will begin a heparin infusion at 1200 units/hr and check an 8-hr HL @ 2000.   Goal of Therapy:  Heparin level 0.3-0.7 units/ml Monitor platelets by anticoagulation protocol: Yes   Plan:  Begin heparin infusion at 1200 units/hr 8-hr HL @ 2200 Monitor INR, plans for cath, hgb/plts, s/s of bleeding  Dannae Kato L. Nicole Kindred, PharmD Clinical Pharmacy Resident Pager: [phone] 05/10/2014 2:22 PM

## 2014-05-10 NOTE — Progress Notes (Signed)
ANTICOAGULATION CONSULT NOTE - follow up Pharmacy Consult for heparin Indication: chest pain/ACS  No Known Allergies  Patient Measurements: Height: 5\' 10"  (177.8 cm) Weight: 212 lb 4.9 oz (96.3 kg) IBW/kg (Calculated) : 73 Heparin Dosing Weight: 93 kg  Vital Signs: Temp: 98.3 F (36.8 C) (09/22 1900) Temp src: Oral (09/22 1900) BP: 115/32 mmHg (09/22 2000) Pulse Rate: 53 (09/22 2000)  Labs:  Recent Labs  05/07/14 2155  05/08/14 0352 05/08/14 1128 05/09/14 0447 05/10/14 0229 05/10/14 2011  HGB 14.4  --   --   --  12.3* 12.4*  --   HCT 43.2  --   --   --  38.2* 37.8*  --   PLT 256  --   --   --  215 207  --   LABPROT  --   < > 39.2*  --  29.8* 22.8*  --   INR  --   < > 4.03*  --  2.84* 2.01*  --   HEPARINUNFRC  --   --   --   --   --   --  0.13*  CREATININE 1.12  --   --   --  1.04 1.05  --   TROPONINI <0.30  --  <0.30 <0.30  --   --   --   < > = values in this interval not displayed.  Estimated Creatinine Clearance: 72.9 ml/min (by C-G formula based on Cr of 1.05).    Assessment: 73 yo m admitted on 9/19 for chest pain and dyspnea x 2-3 days.  Patient is s/p CABG to RCA and then aortic valve replacement surgery for aortic stenosis in 1991 on warfarin 7.5 mg on Monday and Friday then 5 mg on all other days of the week PTA.  Patient is also s/p redo CABG to left coronary system in 1991.  In 2005, patient received a stent to his RCA graft.  Patient also has a history of peripheral vascular disease and is s/p intervention to both the right iliac and bilateral SFAs.  Patient is scheduled for cath tomorrow. Pharmacy consulted to begin heparin.  INR is 2.01 today, hgb 12.4, plts 207, no s/s of bleeding. HL drawn 8 hrs after heparin infusion started at 1200 units/hr is low at 0.13.  No bleeding reported.    Goal of Therapy:  Heparin level 0.3-0.7 units/ml Monitor platelets by anticoagulation protocol: Yes   Plan:  -increase heparin drip to 1500 and check 8 hr HL Eudelia Bunch, Pharm.D. [phone] 05/10/2014 9:52 PM

## 2014-05-11 ENCOUNTER — Inpatient Hospital Stay (HOSPITAL_COMMUNITY): Payer: PRIVATE HEALTH INSURANCE

## 2014-05-11 ENCOUNTER — Inpatient Hospital Stay (HOSPITAL_COMMUNITY): Payer: PRIVATE HEALTH INSURANCE | Admitting: Certified Registered"

## 2014-05-11 ENCOUNTER — Encounter (HOSPITAL_COMMUNITY): Payer: PRIVATE HEALTH INSURANCE | Admitting: Certified Registered"

## 2014-05-11 ENCOUNTER — Encounter (HOSPITAL_COMMUNITY)
Admission: EM | Disposition: A | Discharge status: Home / Self Care | Payer: PRIVATE HEALTH INSURANCE | Source: Home / Self Care | Attending: Cardiology

## 2014-05-11 DIAGNOSIS — E1159 Type 2 diabetes mellitus with other circulatory complications: Secondary | ICD-10-CM

## 2014-05-11 DIAGNOSIS — E785 Hyperlipidemia, unspecified: Secondary | ICD-10-CM

## 2014-05-11 DIAGNOSIS — I2510 Atherosclerotic heart disease of native coronary artery without angina pectoris: Secondary | ICD-10-CM

## 2014-05-11 DIAGNOSIS — I739 Peripheral vascular disease, unspecified: Secondary | ICD-10-CM

## 2014-05-11 DIAGNOSIS — L98499 Non-pressure chronic ulcer of skin of other sites with unspecified severity: Secondary | ICD-10-CM

## 2014-05-11 HISTORY — PX: LEFT HEART CATHETERIZATION WITH CORONARY ANGIOGRAM: SHX5451

## 2014-05-11 LAB — POCT I-STAT 3, ART BLOOD GAS (G3+)
Acid-base deficit: 5 mmol/L — ABNORMAL HIGH (ref 0.0–2.0)
Acid-base deficit: 5 mmol/L — ABNORMAL HIGH (ref 0.0–2.0)
Bicarbonate: 25.6 mEq/L — ABNORMAL HIGH (ref 20.0–24.0)
Bicarbonate: 23 mEq/L (ref 20.0–24.0)
O2 Saturation: 100 %
O2 Saturation: 94 %
PCO2 ART: 70.9 mmHg — AB (ref 35.0–45.0)
PH ART: 7.165 — AB (ref 7.350–7.450)
PH ART: 7.265 — AB (ref 7.350–7.450)
PO2 ART: 81 mmHg (ref 80.0–100.0)
TCO2: 28 mmol/L (ref 0–100)
TCO2: 25 mmol/L (ref 0–100)
pCO2 arterial: 50.9 mmHg — ABNORMAL HIGH (ref 35.0–45.0)
pO2, Arterial: 349 mmHg — ABNORMAL HIGH (ref 80.0–100.0)

## 2014-05-11 LAB — BASIC METABOLIC PANEL
Anion gap: 12 (ref 5–15)
BUN: 31 mg/dL — ABNORMAL HIGH (ref 6–23)
CHLORIDE: 102 meq/L (ref 96–112)
CO2: 25 mEq/L (ref 19–32)
Calcium: 9.1 mg/dL (ref 8.4–10.5)
Creatinine, Ser: 1.39 mg/dL — ABNORMAL HIGH (ref 0.50–1.35)
GFR calc Af Amer: 56 mL/min — ABNORMAL LOW (ref >90)
GFR calc non Af Amer: 49 mL/min — ABNORMAL LOW (ref >90)
GLUCOSE: 222 mg/dL — AB (ref 70–99)
POTASSIUM: 4.6 meq/L (ref 3.7–5.3)
Sodium: 139 mEq/L (ref 137–147)

## 2014-05-11 LAB — CBC
HEMATOCRIT: 41.1 % (ref 39.0–52.0)
HEMOGLOBIN: 13.4 g/dL (ref 13.0–17.0)
MCH: 30.5 pg (ref 26.0–34.0)
MCHC: 32.6 g/dL (ref 30.0–36.0)
MCV: 93.6 fL (ref 78.0–100.0)
Platelets: 204 10*3/uL (ref 150–400)
RBC: 4.39 MIL/uL (ref 4.22–5.81)
RDW: 14.7 % (ref 11.5–15.5)
WBC: 7.3 10*3/uL (ref 4.0–10.5)

## 2014-05-11 LAB — GLUCOSE, CAPILLARY
Glucose-Capillary: 138 mg/dL — ABNORMAL HIGH (ref 70–99)
Glucose-Capillary: 216 mg/dL — ABNORMAL HIGH (ref 70–99)
Glucose-Capillary: 205 mg/dL — ABNORMAL HIGH (ref 70–99)

## 2014-05-11 LAB — PROTIME-INR
INR: 1.51 — ABNORMAL HIGH (ref 0.00–1.49)
Prothrombin Time: 18.2 seconds — ABNORMAL HIGH (ref 11.6–15.2)

## 2014-05-11 LAB — PRO B NATRIURETIC PEPTIDE: Pro B Natriuretic peptide (BNP): 1302 pg/mL — ABNORMAL HIGH (ref 0–125)

## 2014-05-11 LAB — HEPARIN LEVEL (UNFRACTIONATED): Heparin Unfractionated: 0.43 IU/mL (ref 0.30–0.70)

## 2014-05-11 SURGERY — LEFT HEART CATHETERIZATION WITH CORONARY ANGIOGRAM
Anesthesia: LOCAL

## 2014-05-11 MED ORDER — METOPROLOL TARTRATE 1 MG/ML IV SOLN
5.0000 mg | Freq: Four times a day (QID) | INTRAVENOUS | Status: DC
  Administered 2014-05-13 – 2014-05-16 (×12): 5 mg via INTRAVENOUS
  Filled 2014-05-11 (×22): qty 5

## 2014-05-11 MED ORDER — IPRATROPIUM-ALBUTEROL 0.5-2.5 (3) MG/3ML IN SOLN: 3.0000 mL | Freq: Four times a day (QID) | RESPIRATORY_TRACT | Status: DC

## 2014-05-11 MED ORDER — AMIODARONE HCL IN DEXTROSE 360-4.14 MG/200ML-% IV SOLN
60.0000 mg/h | INTRAVENOUS | Status: AC
  Administered 2014-05-11 – 2014-05-12 (×2): 60 mg/h via INTRAVENOUS

## 2014-05-11 MED ORDER — MIDAZOLAM BOLUS VIA INFUSION
1.0000 mg | INTRAVENOUS | Status: DC | PRN
  Filled 2014-05-11: qty 2

## 2014-05-11 MED ORDER — FENTANYL BOLUS VIA INFUSION
25.0000 ug | INTRAVENOUS | Status: DC | PRN
  Filled 2014-05-11: qty 50

## 2014-05-11 MED ORDER — INSULIN ASPART 100 UNIT/ML ~~LOC~~ SOLN
0.0000 [IU] | SUBCUTANEOUS | Status: DC
  Administered 2014-05-12: 3 [IU] via SUBCUTANEOUS
  Administered 2014-05-12 (×2): 2 [IU] via SUBCUTANEOUS
  Administered 2014-05-12: 1 [IU] via SUBCUTANEOUS
  Administered 2014-05-12: 2 [IU] via SUBCUTANEOUS
  Administered 2014-05-12 – 2014-05-13 (×5): 1 [IU] via SUBCUTANEOUS

## 2014-05-11 MED ORDER — NITROGLYCERIN 1 MG/10 ML FOR IR/CATH LAB
INTRA_ARTERIAL | Status: AC
  Filled 2014-05-11: qty 10

## 2014-05-11 MED ORDER — MIDAZOLAM HCL 2 MG/2ML IJ SOLN
INTRAMUSCULAR | Status: AC
  Filled 2014-05-11: qty 2

## 2014-05-11 MED ORDER — HYDRALAZINE HCL 20 MG/ML IJ SOLN: 10.0000 mg | INTRAMUSCULAR | Status: DC | PRN

## 2014-05-11 MED ORDER — FENTANYL CITRATE 0.05 MG/ML IJ SOLN
INTRAMUSCULAR | Status: AC
  Filled 2014-05-11: qty 2

## 2014-05-11 MED ORDER — DOCUSATE SODIUM 50 MG/5ML PO LIQD
100.0000 mg | Freq: Two times a day (BID) | ORAL | Status: DC | PRN
  Filled 2014-05-11: qty 10

## 2014-05-11 MED ORDER — NOREPINEPHRINE BITARTRATE 1 MG/ML IV SOLN
2.0000 ug/min | INTRAVENOUS | Status: DC
  Administered 2014-05-11: 10 ug/min via INTRAVENOUS

## 2014-05-11 MED ORDER — NOREPINEPHRINE BITARTRATE 1 MG/ML IV SOLN
2.0000 ug/min | INTRAVENOUS | Status: DC
  Administered 2014-05-11: 20 ug/min via INTRAVENOUS
  Filled 2014-05-11 (×2): qty 16

## 2014-05-11 MED ORDER — HEPARIN (PORCINE) IN NACL 2-0.9 UNIT/ML-% IJ SOLN
INTRAMUSCULAR | Status: AC
  Filled 2014-05-11: qty 1000

## 2014-05-11 MED ORDER — SUCCINYLCHOLINE CHLORIDE 20 MG/ML IJ SOLN
INTRAMUSCULAR | Status: DC | PRN
  Administered 2014-05-11: 100 mg via INTRAVENOUS

## 2014-05-11 MED ORDER — SODIUM CHLORIDE 0.9 % IV SOLN
0.0000 mg/h | INTRAVENOUS | Status: DC
  Administered 2014-05-11 – 2014-05-12 (×2): 2 mg/h via INTRAVENOUS
  Filled 2014-05-11 (×3): qty 10

## 2014-05-11 MED ORDER — AMIODARONE HCL IN DEXTROSE 360-4.14 MG/200ML-% IV SOLN
INTRAVENOUS | Status: AC
  Filled 2014-05-11: qty 200

## 2014-05-11 MED ORDER — AMIODARONE HCL IN DEXTROSE 360-4.14 MG/200ML-% IV SOLN
30.0000 mg/h | INTRAVENOUS | Status: DC
  Filled 2014-05-11 (×3): qty 200

## 2014-05-11 MED ORDER — SODIUM CHLORIDE 0.9 % IV SOLN
0.0000 ug/h | INTRAVENOUS | Status: DC
  Filled 2014-05-11: qty 50

## 2014-05-11 MED ORDER — ASPIRIN 81 MG PO CHEW
81.0000 mg | CHEWABLE_TABLET | Freq: Every day | ORAL | Status: DC
  Administered 2014-05-11 – 2014-05-12 (×2): 81 mg via ORAL
  Filled 2014-05-11 (×2): qty 1

## 2014-05-11 MED ORDER — LIDOCAINE HCL (PF) 1 % IJ SOLN
INTRAMUSCULAR | Status: AC
  Filled 2014-05-11: qty 30

## 2014-05-11 MED ORDER — PANTOPRAZOLE SODIUM 40 MG IV SOLR: 40.0000 mg | INTRAVENOUS | Status: DC

## 2014-05-11 MED ORDER — CHLORHEXIDINE GLUCONATE 0.12 % MT SOLN
15.0000 mL | Freq: Two times a day (BID) | OROMUCOSAL | Status: DC
  Administered 2014-05-11 – 2014-05-12 (×3): 15 mL via OROMUCOSAL
  Filled 2014-05-11 (×3): qty 15

## 2014-05-11 MED ORDER — FENTANYL CITRATE 0.05 MG/ML IJ SOLN: 50.0000 ug | Freq: Once | INTRAMUSCULAR | Status: DC

## 2014-05-11 MED ORDER — CETYLPYRIDINIUM CHLORIDE 0.05 % MT LIQD
7.0000 mL | Freq: Four times a day (QID) | OROMUCOSAL | Status: DC
  Administered 2014-05-11 – 2014-05-14 (×11): 7 mL via OROMUCOSAL

## 2014-05-11 MED ORDER — MIDAZOLAM HCL 2 MG/2ML IJ SOLN: 2.0000 mg | INTRAMUSCULAR | Status: DC | PRN

## 2014-05-11 MED ORDER — DEXTROSE 5 % IV SOLN
2.0000 ug/min | INTRAVENOUS | Status: DC
  Filled 2014-05-11: qty 4

## 2014-05-11 MED ORDER — PANTOPRAZOLE SODIUM 40 MG IV SOLR
40.0000 mg | INTRAVENOUS | Status: DC
  Administered 2014-05-11 – 2014-05-15 (×5): 40 mg via INTRAVENOUS
  Filled 2014-05-11 (×9): qty 40

## 2014-05-11 MED ORDER — FUROSEMIDE 10 MG/ML IJ SOLN
INTRAMUSCULAR | Status: AC
  Filled 2014-05-11: qty 4

## 2014-05-11 MED ORDER — LEVALBUTEROL HCL 0.63 MG/3ML IN NEBU
0.6300 mg | INHALATION_SOLUTION | Freq: Four times a day (QID) | RESPIRATORY_TRACT | Status: DC
  Administered 2014-05-11 – 2014-05-16 (×18): 0.63 mg via RESPIRATORY_TRACT
  Filled 2014-05-11 (×33): qty 3

## 2014-05-11 MED ORDER — METOPROLOL TARTRATE 1 MG/ML IV SOLN
INTRAVENOUS | Status: AC
  Administered 2014-05-11: 5 mg
  Filled 2014-05-11: qty 5

## 2014-05-11 MED ORDER — VECURONIUM BROMIDE 10 MG IV SOLR
INTRAVENOUS | Status: AC
  Filled 2014-05-11: qty 10

## 2014-05-11 MED ORDER — FUROSEMIDE 10 MG/ML IJ SOLN
40.0000 mg | Freq: Two times a day (BID) | INTRAMUSCULAR | Status: DC
  Administered 2014-05-11 – 2014-05-13 (×4): 40 mg via INTRAVENOUS
  Filled 2014-05-11 (×8): qty 4

## 2014-05-11 MED ORDER — HEPARIN (PORCINE) IN NACL 100-0.45 UNIT/ML-% IJ SOLN
1400.0000 [IU]/h | INTRAMUSCULAR | Status: DC
  Administered 2014-05-11 – 2014-05-12 (×2): 1500 [IU]/h via INTRAVENOUS
  Administered 2014-05-13: 1400 [IU]/h via INTRAVENOUS
  Filled 2014-05-11 (×6): qty 250

## 2014-05-11 MED ORDER — ETOMIDATE 2 MG/ML IV SOLN
INTRAVENOUS | Status: DC | PRN
  Administered 2014-05-11: 12 mg via INTRAVENOUS

## 2014-05-11 MED ORDER — SODIUM CHLORIDE 0.9 % IV SOLN
3.0000 g | Freq: Three times a day (TID) | INTRAVENOUS | Status: DC
  Administered 2014-05-11 – 2014-05-17 (×17): 3 g via INTRAVENOUS
  Filled 2014-05-11 (×21): qty 3

## 2014-05-11 NOTE — Anesthesia Procedure Notes (Signed)
Procedure Name: Intubation Date/Time: 05/11/2014 2:46 PM Performed by: Maeola Harman Pre-anesthesia Checklist: Patient identified, Emergency Drugs available, Suction available, Patient being monitored and Timeout performed Patient Re-evaluated:Patient Re-evaluated prior to inductionOxygen Delivery Method: Circle system utilized Preoxygenation: Pre-oxygenation with 100% oxygen Intubation Type: IV induction Ventilation: Mask ventilation without difficulty Laryngoscope Size: Mac and 4 Grade View: Grade I Tube type: Oral Tube size: 7.5 mm Number of attempts: 1 Airway Equipment and Method: Stylet Placement Confirmation: ETT inserted through vocal cords under direct vision,  positive ETCO2 and breath sounds checked- equal and bilateral Secured at: 22 cm Tube secured with: Tape Dental Injury: Teeth and Oropharynx as per pre-operative assessment  Comments: K= 4.5, NKDA noted, Dr. Linna Caprice intubated.  +ETCO2 and + BBSE, verified with fluro.

## 2014-05-11 NOTE — Interval H&P Note (Signed)
History and Physical Interval Note:  05/11/2014 1:44 PM  Tanner Pena  has presented today for cardiac cath with the diagnosis of CAD s/p CABG, unstable angina  The various methods of treatment have been discussed with the patient and family. After consideration of risks, benefits and other options for treatment, the patient has consented to  Procedure(s): LEFT HEART CATHETERIZATION WITH CORONARY ANGIOGRAM (N/A) as a surgical intervention .  The patient's history has been reviewed, patient examined, no change in status, stable for surgery.  I have reviewed the patient's chart and labs.  Questions were answered to the patient's satisfaction.    Cath Lab Visit (complete for each Cath Lab visit)  Clinical Evaluation Leading to the Procedure:   ACS: No.  Non-ACS:    Anginal Classification: CCS III  Anti-ischemic medical therapy: Maximal Therapy (2 or more classes of medications)  Non-Invasive Test Results: No non-invasive testing performed  Prior CABG: Previous CABG        Tanner Pena

## 2014-05-11 NOTE — Consult Note (Signed)
PULMONARY / CRITICAL CARE MEDICINE   Name: Tanner Pena MRN: [id number] DOB: 1940/12/07    ADMISSION DATE:  05/07/2014 CONSULTATION DATE:  05/11/14  REFERRING MD :  Dr. Radford Pax   CHIEF COMPLAINT:  Chest pain  INITIAL PRESENTATION:  73 y/o M admitted with chest pain from [facility].  Tx to [facility] on 9/21.  Underwent cardiac cath on 9/23 with non-critical disease.  Suffered respiratory distress (pulmonary edema) during cath requiring intubation.  PCCM consulted.   STUDIES:  9/23 LHC >>  SIGNIFICANT EVENTS: 9/19  Admitted through [facility] ED with chest pain 9/21  Transferred to [facility] in preparation for heart cath 9/23  Heart catheterization during which patient experienced acute respiratory distress requiring intubation   HISTORY OF PRESENT ILLNESS:  73 yo M smoker with a PMH of DM, HTN, CAD, PVD (with LE stents), OSA, COPD, s/p aortic valve replacement and CABG x 2 who was admitted through the ED at [facility] 9/19 for chest pain.  His first episode occurred on 9/18 while he was attempting to start his 4 wheeler.  He described substernal burning and pain, but denied dyspnea, diaphoresis, or weakness.  The pain subsided in about one hour without medications.  The second occurrence of chest pain was on 9/19 as he was eating [employer] fried chicken dinner.  Again the pain was substernal burning with the addition of an ache that radiates to his jaw, neck and down the inside of both arms.  He also reported increased dyspnea, but no diaphoresis.  After his second episode, he sought care in the ER.  He was seen by Cardiology, and diagnosed with unstable angina.  Troponin levels were negative and EKG was inconclusive for ischemia due to chronic LBBB.   On 9/21, he was transferred to [facility] due to concern for underlying ischemia and the need for definitive evaluation with heart cath.  On 9/22, he was evaluated by [facility] Cardiologist who recommended catheterization delay due to INR greater than 2.  He was started on heparin for his  mechanical aortic valve in order to allow his INR to decrease.  Meanwhile, Vascular surgery was also consulted due to chronic L LE ischemic changes and recommended local wound care and outpatient follow up.  The cardiac catheterization occured 9/23 and during the initial phase of the procedure, the patient experienced acute respiratory distress requiring intubation.  Paralytics, sedatives, and levophed were started in the cath lab while the procedure was completed. No critical lesions were noted.  PCCM was consulted and the patient was transferred back to the ICU.   PAST MEDICAL HISTORY :  Past Medical History  Diagnosis Date  . Diabetes mellitus 2007  . Hypertension   . Gout   . Hypercholesteremia   . Peripheral vascular disease     stents lower extremity  . Chronic pain     foot, hips  . Sleep apnea   . COPD (chronic obstructive pulmonary disease)   . 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 Chronic back pain   . Dysphagia   . Neuromuscular disorder   . Peripheral neuropathy   . Critical lower limb ischemia    Past Surgical History  Procedure Laterality Date  . Back surgery  [id number]    2  . Open heart surgery  1990    prosthetic heart valve, one bypass  . Rotator cuff repair      right  . Cataract extraction      bilateral  . Coronary  stent placement  2005    RCA vein graft A 3.0x13.0 TAXUS stent was then placed int he vessel a Viva 3.0x4.0 (perfusion balloon was made ready it was placed through the entire lenght of the stent  . Peripheral vascular procedures lower extremities      right external iliac  artery PTA and stenting as well as bilateral SFA intervention remotely. Repeat procedures in 2011 bilaterally  . Coronary artery bypass graft  1994    6 vessels  . [name]  06/13/2011    Procedure: Venia Minks DILATION;  Surgeon: [name], MD;  Location: [facility] ORS;  Service: Endoscopy;  Laterality: N/A;  Dilated to 56.   . Angioplasty illiac artery      Prior to Admission medications   Medication Sig Start Date End Date Taking? Authorizing Provider  albuterol (VOSPIRE ER) 4 MG 12 hr tablet Take 4 mg by mouth 2 (two) times daily.    Yes Historical Provider, MD  albuterol-ipratropium (COMBIVENT) 18-103 MCG/ACT inhaler Inhale 1 puff into the lungs 4 (four) times daily. Coughing/ Shortness of Breath   Yes Historical Provider, MD  allopurinol (ZYLOPRIM) 300 MG tablet Take 300 mg by mouth at bedtime.    Yes Historical Provider, MD  amLODipine (NORVASC) 5 MG tablet Take 5 mg by mouth every morning.    Yes Historical Provider, MD  aspirin EC 81 MG tablet Take 81 mg by mouth at bedtime.   Yes Historical Provider, MD  fenofibrate (TRICOR) 145 MG tablet Take 1 tablet (145 mg total) by mouth daily. 02/24/14  Yes Troy Sine, MD  fish oil-omega-3 fatty acids 1000 MG capsule Take 1 capsule (1 g total) by mouth 2 (two) times daily. 01/22/13  Yes Tommy Medal, RPH-CPP  furosemide (LASIX) 80 MG tablet Take 40 mg by mouth daily.   Yes Historical Provider, MD  glimepiride (AMARYL) 1 MG tablet Take 1 mg by mouth 2 (two) times daily.   Yes Historical Provider, MD  isosorbide mononitrate (IMDUR) 30 MG 24 hr tablet Take 1 tablet (30 mg total) by mouth daily. 09/02/13  Yes Troy Sine, MD  metoprolol succinate (TOPROL-XL) 50 MG 24 hr tablet Take 75 mg by mouth daily. Take with or immediately following a meal.   Yes Historical Provider, MD  oxyCODONE-acetaminophen (PERCOCET) 10-325 MG per tablet Take 1 tablet by mouth every 6 (six) hours as needed for pain.  01/19/13  Yes Historical Provider, MD  potassium chloride SA (K-DUR,KLOR-CON) 20 MEQ tablet Take 20 mEq by mouth daily.     Yes Historical Provider, MD  pregabalin (LYRICA) 50 MG capsule Take 50 mg by mouth 2 (two) times daily.    Yes Historical Provider, MD  ramipril (ALTACE) 5 MG capsule Take 2.5-5 mg by mouth 2 (two) times daily. Take 1 tablet in the morning and 1/2 tablet at bedtime.   Yes Historical Provider,  MD  rosuvastatin (CRESTOR) 20 MG tablet Take 20 mg by mouth at bedtime.   Yes Historical Provider, MD  sitaGLIPtin (JANUVIA) 50 MG tablet Take 50 mg by mouth daily.   Yes Historical Provider, MD  warfarin (COUMADIN) 5 MG tablet Take 5-7.5 mg by mouth at bedtime. Takes one and one-half tablet (7.5mg  total) on Mondays and Fridays. Takes one tablet (5mg  total) on all other days   Yes Historical Provider, MD   No Known Allergies  FAMILY HISTORY:  Family History  Problem Relation Age of Onset  . Colon cancer Neg Hx   . Liver disease Neg  Hx    SOCIAL HISTORY:  reports that he has been smoking Cigarettes.  He started smoking about 60 years ago. He has a 55 pack-year smoking history. He quit smokeless tobacco use about 18 years ago. His smokeless tobacco use included Chew. He reports that he drinks alcohol. He reports that he does not use illicit drugs.  REVIEW OF SYSTEMS:   Unable to elicit ROS due to intubation  SUBJECTIVE:   VITAL SIGNS: Temp:  [97.7 F (36.5 C)-98.3 F (36.8 C)] 98 F (36.7 C) (09/23 1142) Pulse Rate:  [43-66] 55 (09/23 1142) Resp:  [14-23] 17 (09/23 1012) BP: (92-141)/(22-74) 116/68 mmHg (09/23 1142) SpO2:  [94 %-100 %] 94 % (09/23 1142) Weight:  [214 lb 4.6 oz (97.2 kg)] 214 lb 4.6 oz (97.2 kg) (09/23 0600)  HEMODYNAMICS:    VENTILATOR SETTINGS:    INTAKE / OUTPUT:  Intake/Output Summary (Last 24 hours) at 05/11/14 1507 Last data filed at 05/11/14 1300  Gross per 24 hour  Intake   1767 ml  Output   1850 ml  Net    -83 ml    PHYSICAL EXAMINATION: General:  Chronically ill in NAD Neuro:  AAOx4, speech clear, MAE HEENT:  Mm pink/moist, OETT Cardiovascular:  s1s2 irregular, ST with PVC's on monitor  Lungs:  resp's even/non-labored, lungs bilaterally diminished  Abdomen:  Rounds/soft, bsx4 active  Musculoskeletal:  No acute deformities  Skin:  Warm upper extremities, LE's cool with ulcerations  LABS:  CBC  Recent Labs Lab 05/09/14 0447  05/10/14 0229 05/11/14 0630  WBC 7.8 7.3 7.3  HGB 12.3* 12.4* 13.4  HCT 38.2* 37.8* 41.1  PLT 215 207 204   Coag's  Recent Labs Lab 05/09/14 0447 05/10/14 0229 05/11/14 0630  INR 2.84* 2.01* 1.51*   BMET  Recent Labs Lab 05/07/14 2155 05/09/14 0447 05/10/14 0229  NA 141 142 140  K 4.1 4.4 4.5  CL 101 105 104  CO2 24 26 27   BUN 23 21 29*  CREATININE 1.12 1.04 1.05  GLUCOSE 168* 114* 123*   Electrolytes  Recent Labs Lab 05/07/14 2155 05/08/14 0348 05/09/14 0447 05/10/14 0229  CALCIUM 10.2  --  9.6 9.4  MG  --  2.0  --   --    Sepsis Markers No results found for this basename: LATICACIDVEN, PROCALCITON, O2SATVEN,  in the last 168 hours  ABG No results found for this basename: PHART, PCO2ART, PO2ART,  in the last 168 hours  Liver Enzymes  Recent Labs Lab 05/07/14 2155  AST 31  ALT 17  ALKPHOS 32*  BILITOT 0.6  ALBUMIN 4.4   Cardiac Enzymes  Recent Labs Lab 05/07/14 2155 05/08/14 0352 05/08/14 1128 05/10/14 0229  TROPONINI <0.30 <0.30 <0.30  --   PROBNP  --   --   --  820.3*   Glucose  Recent Labs Lab 05/09/14 2107 05/10/14 0830 05/10/14 1154 05/10/14 1626 05/10/14 2136 05/11/14 1151  GLUCAP 168* 152* 182* 180* 203* 138*    Imaging No results found.   ASSESSMENT / PLAN:  PULMONARY OETT 9/23>> A: Acute Hypoxic Respiratory Distress Possible Aspiration - during respiratory distress in cath lab Pulmonary Edema - concern for flash pulmonary edema during cardiac cath COPD OSA P:   Full support, 8cc/kg Follow up ABG in 1 hour (1730) SBT / WUA in am  Trend PCXR, assess now to review for edema  Duonebs  Lasix given during cath, re-eval need for further in am  CARDIOVASCULAR CVL (veinous sheath R groin) 9/23>>  A:  CAD - s/p LHC with non-critical disease. Hypotension - related to sedation, resolved.  PVC's / Tachycardia  Hx HTN PVD S/p aortic valve replacement Hx CABG x2 P:  Cardiology deferring management of CAD  until pt stable ASA Heparin gtt per Cards Levophed gtt to maintain MAP >65 (off on return to ICU) Continue Lipitor, fenofibrate  Change toprol xl to IV dosing for now  Lasix 40 daily  Adjust ACE to begin 9/24, pending sr cr assessment   Hold imdur, hydralazine IV   RENAL A:  At Risk AKI  P:   Now BMP Trend Sr Cr Ensure adequate perfusion   GASTROINTESTINAL A:   Obesity  Vent Associated Dysphagia  P:   SUP: Protonix  NPO OGT to LIS Consider TF in am if not extubated   HEMATOLOGIC A:   No acute issues  P:  Monitor CBC  INFECTIOUS A:   No acute issues P:   Monitor fever curve / leukocytosis Assess CXR to ensure no infiltrate  ENDOCRINE A:  DM  Gout P:   SSI  Continue allopurinol for now Hold amaryl  NEUROLOGIC A:  Peripheral neuropathy Chronic pain P:   RASS goal: -1 Fentanyl gtt PRN versed Continue lyrica     Family/patient updated:  By Day 3  Interdisciplinary Family Meeting v Palliative Care Meeting:  n/a By day 7   TODAY'S SUMMARY: 73 y/o M admitted with chest pain.  LHC on 9/23 with respiratory distress requiring intubation. Suspect pulmonary edema but may have aspirated.  CXR pending, f/u abg pending.   Noe Gens, NP-C [facility] Pgr: [phone] or [phone]     I have personally obtained a history, examined the patient, evaluated laboratory and imaging results, formulated the assessment and plan and placed orders.  CRITICAL CARE: The patient is critically ill with multiple organ systems failure and requires high complexity decision making for assessment and support, frequent evaluation and titration of therapies, application of advanced monitoring technologies and extensive interpretation of multiple databases. Critical Care Time devoted to patient care services described in this note is __ minutes.      05/11/2014, 3:07 PM

## 2014-05-11 NOTE — Progress Notes (Addendum)
ANTICOAGULATION CONSULT NOTE - Follow Up Consult  Pharmacy Consult for heparin Indication: chest pain/ACS  No Known Allergies  Patient Measurements: Height: 5\' 10"  (177.8 cm) Weight: 214 lb 4.6 oz (97.2 kg) IBW/kg (Calculated) : 73 Heparin Dosing Weight: 93 kg  Vital Signs: Temp: 98 F (36.7 C) (09/23 1142) Temp src: Oral (09/23 1142) BP: 116/68 mmHg (09/23 1142) Pulse Rate: 98 (09/23 1625)  Labs:  Recent Labs  05/09/14 0447 05/10/14 0229 05/10/14 2011 05/11/14 0630  HGB 12.3* 12.4*  --  13.4  HCT 38.2* 37.8*  --  41.1  PLT 215 207  --  204  LABPROT 29.8* 22.8*  --  18.2*  INR 2.84* 2.01*  --  1.51*  HEPARINUNFRC  --   --  0.13* 0.43  CREATININE 1.04 1.05  --   --     Estimated Creatinine Clearance: 73.3 ml/min (by C-G formula based on Cr of 1.05).   Medications:  Scheduled:  . allopurinol  300 mg Oral QHS  . amLODipine  5 mg Oral q morning - 10a  . [START ON 05/12/2014] antiseptic oral rinse  7 mL Mouth Rinse QID  . aspirin EC  81 mg Oral QHS  . atorvastatin  80 mg Oral q1800  . chlorhexidine  15 mL Mouth Rinse BID  . fenofibrate  160 mg Oral Daily  . fentaNYL  50 mcg Intravenous Once  . furosemide  40 mg Intravenous Q12H  . insulin aspart  0-9 Units Subcutaneous TID WC  . ipratropium  0.5 mg Nebulization TID PC  . isosorbide mononitrate  45 mg Oral Daily  . levalbuterol  0.63 mg Nebulization Q6H  . lisinopril  2.5 mg Oral Daily  . metoprolol  5 mg Intravenous 4 times per day  . omega-3 acid ethyl esters  1 g Oral BID  . pantoprazole (PROTONIX) IV  40 mg Intravenous Q24H  . potassium chloride SA  20 mEq Oral Daily  . povidone-iodine   Topical Q1200  . pregabalin  50 mg Oral QHS   Infusions:  . sodium chloride 10 mL/hr (05/10/14 1445)  . fentaNYL infusion INTRAVENOUS    . midazolam (VERSED) infusion      Assessment: 73 yo m admitted on 9/19 for chest pain and dyspnea x 2-3 days. Patient is s/p CABG to RCA and then aortic valve replacement surgery  for aortic stenosis in 1991 on warfarin 7.5 mg on Monday and Friday then 5 mg on all other days of the week PTA. Patient is also s/p redo CABG to left coronary system in 1991. In 2005, patient received a stent to his RCA graft. Patient also has a history of peripheral vascular disease and is s/p intervention to both the right iliac and bilateral SFAs. HL this AM was therapeutic at 0.43 on 1500 units/hr.  Heparin was stopped prior to cath.  Patient is now s/p cath with findings of severe triple vessel CAD s/p 6V CABG with severe restenosis of SVG to RCA.  Patient developed bradycardia and respiratory failure during cath requiring intubation.  No intervention was done d/t patient's respiratory decline, volume overload, hypotension and inability to give oral antiplatelets at the moment.  Patient will eventually need PCI to occluded RCA.  Heparin is to restart 8-hrs post shealth removal.  Shealth was removed ~1530 per RN.  Will restart heparin infusion at 2330 tonight at a rate of 1500 units/hr with careful monitoring for bleeding.  Will obtain a HL with AM labs to determine need for adjustments. Hgb  13.4, plts 204, no bleeding noted (labs were pre-cath).   Goal of Therapy:  Heparin level 0.3-0.7 units/ml Monitor platelets by anticoagulation protocol: Yes   Plan:  Resume heparin infusion at 1500 units/hr at 2330 tonight HL with AM labs to determine adjustments Monitor carefully for s/s of bleeding, hgb/plts, clinical course  Cassie L. Nicole Kindred, PharmD Clinical Pharmacy Resident Pager: [phone] 05/11/2014 4:51 PM  Addendum:  Patient with possible aspiration event. New orders to start empiric unasyn. Renal function appropriate for q8 hour dosing.  Plan Unasyn 3g q8 hours  Erin Hearing PharmD., BCPS Clinical Pharmacist Pager [phone] 05/11/2014 5:49 PM

## 2014-05-11 NOTE — Transfer of Care (Signed)
Immediate Anesthesia Transfer of Care Note  Patient: Tanner Pena  Procedure(s) Performed: * No procedures listed *  Patient Location: Cath Lab  Anesthesia Type:General  Level of Consciousness: Patient remains intubated per anesthesia plan  Airway & Oxygen Therapy: Patient placed on Ventilator (see vital sign flow sheet for setting)  Post-op Assessment: Post -op Vital signs reviewed and stable  Post vital signs: stable  Complications: No apparent anesthesia complications

## 2014-05-11 NOTE — H&P (View-Only) (Signed)
PROGRESS NOTE  Subjective:   73 yo with hx of CAD , AVR  Admitted in transfer from [facility] with symptoms of unstable angina, Similar to his previous episodes of angina. The episode was preceded on one occasion by a greasy chicken dinner. He states that symptoms were very somewhat to his previous episodes of angina. He did not try nitroglycerin because he had run out.  He has had ischemic changes in his left foot for 18 months.  Very tender.   Objective:    Vital Signs:   Temp:  [97.5 F (36.4 C)-98.3 F (36.8 C)] 98.1 F (36.7 C) (09/23 0715) Pulse Rate:  [43-164] 51 (09/23 0800) Resp:  [14-27] 22 (09/23 0800) BP: (92-147)/(22-109) 117/34 mmHg (09/23 0800) SpO2:  [92 %-100 %] 98 % (09/23 0800) Weight:  [214 lb 4.6 oz (97.2 kg)] 214 lb 4.6 oz (97.2 kg) (09/23 0600)  Last BM Date: 05/09/14   24-hour weight change: Weight change: 1 lb 15.7 oz (0.9 kg)  Weight trends: Filed Weights   05/09/14 0500 05/09/14 1330 05/11/14 0600  Weight: 215 lb 9.8 oz (97.8 kg) 212 lb 4.9 oz (96.3 kg) 214 lb 4.6 oz (97.2 kg)    Intake/Output:  09/22 0701 - 09/23 0700 In: 1403.8 [P.O.:720; I.V.:683.8] Out: 2125 [Urine:2125] Total I/O In: 405 [I.V.:405] Out: 200 [Urine:200]   Physical Exam: BP 117/34  Pulse 51  Temp(Src) 98.1 F (36.7 C) (Oral)  Resp 22  Ht 5\' 10"  (1.778 m)  Wt 214 lb 4.6 oz (97.2 kg)  BMI 30.75 kg/m2  SpO2 98%  Wt Readings from Last 3 Encounters:  05/11/14 214 lb 4.6 oz (97.2 kg)  05/11/14 214 lb 4.6 oz (97.2 kg)  01/19/14 212 lb (96.163 kg)    General: Vital signs reviewed and noted.   Head: Normocephalic, atraumatic.  Eyes: conjunctivae/corneas clear.  EOM's intact.   Throat: normal  Neck:  normal   Lungs:    clear  Heart:  Irreg. Mechanical S2  Abdomen:  Soft, non-tender, non-distended    Extremities: Trace edema on right, 1+ on left.  .  His left great toe . 2nd and 3rd toes appear to be ischemic.    Neurologic: A&O X3, CN II - XII are grossly  intact.   Psych: Normal     Labs: BMET:  Recent Labs  05/09/14 0447 05/10/14 0229  NA 142 140  K 4.4 4.5  CL 105 104  CO2 26 27  GLUCOSE 114* 123*  BUN 21 29*  CREATININE 1.04 1.05  CALCIUM 9.6 9.4    Liver function tests: No results found for this basename: AST, ALT, ALKPHOS, BILITOT, PROT, ALBUMIN,  in the last 72 hours No results found for this basename: LIPASE, AMYLASE,  in the last 72 hours  CBC:  Recent Labs  05/10/14 0229 05/11/14 0630  WBC 7.3 7.3  HGB 12.4* 13.4  HCT 37.8* 41.1  MCV 91.1 93.6  PLT 207 204    Cardiac Enzymes:  Recent Labs  05/08/14 1128  TROPONINI <0.30    Coagulation Studies:  Recent Labs  05/09/14 0447 05/10/14 0229 05/11/14 0630  LABPROT 29.8* 22.8* 18.2*  INR 2.84* 2.01* 1.51*    Other: No components found with this basename: POCBNP,  No results found for this basename: DDIMER,  in the last 72 hours  Recent Labs  05/09/14 0447  HGBA1C 6.8*    Recent Labs  05/09/14 0530  CHOL 123  HDL 29*  LDLCALC 62  TRIG  161*  CHOLHDL 4.2    Recent Labs  05/09/14 0446  TSH 1.940   No results found for this basename: VITAMINB12, FOLATE, FERRITIN, TIBC, IRON, RETICCTPCT,  in the last 72 hours   Other results:  EKG :  NSR, LBBB Telemetry: Normal sinus rhythm. He is frequent premature ventricular contractions. Medications:    Infusions: . sodium chloride 75 mL/hr at 05/11/14 0800  . sodium chloride 10 mL/hr (05/10/14 1445)  . heparin 1,500 Units/hr (05/11/14 0800)    Scheduled Medications: . allopurinol  300 mg Oral QHS  . amLODipine  5 mg Oral q morning - 10a  . aspirin  81 mg Oral Pre-Cath  . aspirin EC  81 mg Oral QHS  . atorvastatin  80 mg Oral q1800  . fenofibrate  160 mg Oral Daily  . furosemide  40 mg Oral Daily  . glimepiride  1 mg Oral BID WC  . insulin aspart  0-9 Units Subcutaneous TID WC  . ipratropium  0.5 mg Nebulization TID PC  . isosorbide mononitrate  45 mg Oral Daily  . levalbuterol   0.63 mg Nebulization TID  . lisinopril  2.5 mg Oral Daily  . metoprolol succinate  50 mg Oral Daily  . omega-3 acid ethyl esters  1 g Oral BID  . potassium chloride SA  20 mEq Oral Daily  . povidone-iodine   Topical Q1200  . pregabalin  50 mg Oral QHS  . 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 Unstable angina: The patient has a history of coronary artery disease. If symptoms that were consistent with angina. He started with exertion and seemed to worsen as he walked around. His troponin levels are negative. He's currently pain-free.  He was originally scheduled for cardiac catheterization but given the fact that his INR is greater than 2, we will delay the procedure until tomorrow. He'll be started on heparin for his mechanical aortic valve as we allow his Coumadin to drift down.  2. Aortic stenosis: Patient has a mechanical aortic valve. It sounds nice and crisp.  INR is 2.01.  Will delay cath until tomorrow.   3. Hyperlipidemia:  He is on atorvastatin 80 mg a day  4. Diabetes mellitus: Continue with his current diabetic medications  5. Bradycardia :  He is metoprolol 75 mg a day. We'll reduce his dose to 50 mg a day.  THS is ok.   6. Peripheral vascular disease:  He has severe PVD and has been followed by Dr.  [name]. His left foot appears to be ischemic. Will ask [facility] to see him to help with management. He will likely need PCI today.    Disposition:  Length of Stay: 4  Thayer Headings, Brooke Bonito., MD, [facility] 05/11/2014,  8:52 AM Office [phone] Pager [phone]   Addendum:  I have spoken with Dr. Kellie Simmering. The left foot is ischemic but is very stable and does not show any signs of needing surgery at any time soon.  Given this information, we should proceed with PCI using DES or BMS - whichever one will be best for the coronary without worrying about the duration of  DAPT.     Thayer Headings, Brooke Bonito., MD, [facility] 05/11/2014, 1:03 PM [address],  [place] Pager [phone]

## 2014-05-11 NOTE — Progress Notes (Signed)
When patient returned from cath lab intubated,condom cath was placed.Pt voided 100cc yellow urine. Within the hour, the condom cath came off and two attempts to replace condom cath were unsuccessful. Rn attempted to place foley using sterile technique with assistance of Deanna Revis ,rn and immediately met resistance(at 1700). Coude team called ([place] and 6 nrth) and neither team available. Bladder scanned pt for 43cc. Decision made to prop urinal as pt will more than likely be extubated tomorrow and no need for urology as bladder is not distended.  Etta Quill

## 2014-05-11 NOTE — Consult Note (Signed)
[facility]  Reason for Consult:  Ulcers of left foot Referring Physician:  Nahser MRN #:  [id number]  History of Present Illness: This is a 73 y.o. male with past medical history of PVD s/p bilateral SFA stents and right external iliac artery,  CAD s/p cabg x 2 and multiple stents, aortic valve replacement on coumadin, DM and HTN who was admitted for exertional chest pain. We have been consulted regarding his left foot ulcers that have been present for 18 months. He has been seen in the past by Dr. [name] for PVD and had an aortogram with bilateral runoff on 02/15/13. This revealed patent SFA stents bilaterally. The left anterior tibial and peroneal were occluded with one vessel runoff via the posterior tibial which was diffusely diseased. He was told he was not a candidate for revascularization and may require amputation if local wound care does not help. The patient states that his wounds have been improving. He does not rest pain at night in his foot and hip pain. He is not active and denies intermittent claudication.   He currently denies any chest pain or shortness of breath. His foot is not currently bothering him.   Past Medical History  Diagnosis Date  . Diabetes mellitus 2007  . Hypertension   . Gout   . Hypercholesteremia   . Peripheral vascular disease     stents lower extremity  . Chronic pain     foot, hips  . Sleep apnea   . COPD (chronic obstructive pulmonary disease)   . 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 Chronic back pain   . Dysphagia   . Neuromuscular disorder   . Peripheral neuropathy   . Critical lower limb ischemia    Past Surgical History  Procedure Laterality Date  . Back surgery  5573,2202    2  . Open heart surgery  1990    prosthetic heart valve, one bypass  . Rotator cuff repair      right  . Cataract extraction      bilateral  . Coronary stent placement  2005    RCA vein graft A 3.0x13.0 TAXUS  stent was then placed int he vessel a Viva 3.0x4.0 (perfusion balloon was made ready it was placed through the entire lenght of the stent  . Peripheral vascular procedures lower extremities      right external iliac  artery PTA and stenting as well as bilateral SFA intervention remotely. Repeat procedures in 2011 bilaterally  . Coronary artery bypass graft  1994    6 vessels  . [name]  06/13/2011    Procedure: Venia Minks DILATION;  Surgeon: [name], MD;  Location: [facility];  Service: Endoscopy;  Laterality: N/A;  Dilated to 56.   . Angioplasty illiac artery      No Known Allergies  Prior to Admission medications   Medication Sig Start Date End Date Taking? Authorizing Provider  albuterol (VOSPIRE ER) 4 MG 12 hr tablet Take 4 mg by mouth 2 (two) times daily.    Yes Historical Provider, MD  albuterol-ipratropium (COMBIVENT) 18-103 MCG/ACT inhaler Inhale 1 puff into the lungs 4 (four) times daily. Coughing/ Shortness of Breath   Yes Historical Provider, MD  allopurinol (ZYLOPRIM) 300 MG tablet Take 300 mg by mouth at bedtime.    Yes Historical Provider, MD  amLODipine (NORVASC) 5 MG tablet Take 5 mg by mouth every morning.    Yes Historical Provider, MD  aspirin EC 81 MG tablet Take 81 mg by mouth at bedtime.   Yes Historical Provider, MD  fenofibrate (TRICOR) 145 MG tablet Take 1 tablet (145 mg total) by mouth daily. 02/24/14  Yes Troy Sine, MD  fish oil-omega-3 fatty acids 1000 MG capsule Take 1 capsule (1 g total) by mouth 2 (two) times daily. 01/22/13  Yes Tommy Medal, RPH-CPP  furosemide (LASIX) 80 MG tablet Take 40 mg by mouth daily.   Yes Historical Provider, MD  glimepiride (AMARYL) 1 MG tablet Take 1 mg by mouth 2 (two) times daily.   Yes Historical Provider, MD  isosorbide mononitrate (IMDUR) 30 MG 24 hr tablet Take 1 tablet (30 mg total) by mouth daily. 09/02/13  Yes Troy Sine, MD  metoprolol succinate (TOPROL-XL) 50 MG 24 hr tablet Take 75 mg by mouth daily. Take  with or immediately following a meal.   Yes Historical Provider, MD  oxyCODONE-acetaminophen (PERCOCET) 10-325 MG per tablet Take 1 tablet by mouth every 6 (six) hours as needed for pain.  01/19/13  Yes Historical Provider, MD  potassium chloride SA (K-DUR,KLOR-CON) 20 MEQ tablet Take 20 mEq by mouth daily.     Yes Historical Provider, MD  pregabalin (LYRICA) 50 MG capsule Take 50 mg by mouth 2 (two) times daily.    Yes Historical Provider, MD  ramipril (ALTACE) 5 MG capsule Take 2.5-5 mg by mouth 2 (two) times daily. Take 1 tablet in the morning and 1/2 tablet at bedtime.   Yes Historical Provider, MD  rosuvastatin (CRESTOR) 20 MG tablet Take 20 mg by mouth at bedtime.   Yes Historical Provider, MD  sitaGLIPtin (JANUVIA) 50 MG tablet Take 50 mg by mouth daily.   Yes Historical Provider, MD  warfarin (COUMADIN) 5 MG tablet Take 5-7.5 mg by mouth at bedtime. Takes one and one-half tablet (7.5mg  total) on Mondays and Fridays. Takes one tablet (5mg  total) on all other days   Yes Historical Provider, MD    History   Social History  . Marital Status: Legally Separated    Spouse Name: N/A    Number of Children: 5  . Years of Education: N/A   Occupational History  . retired     Stage manager   Social History Main Topics  . Smoking status: Current Every Day Smoker -- 1.00 packs/day for 55 years    Types: Cigarettes    Start date: 08/19/1953  . Smokeless tobacco: Former Systems developer    Types: [place] date: 08/20/1995     Comment: Has quit on 3 occasions. Counseling given today 5-10 minutes   I am more than likely going to quit "  . Alcohol Use: Yes     Comment: socially, sometimes 12 ounce beer daily, may go month without/no whiskey  . Drug Use: No  . Sexual Activity: Not on file   Other Topics Concern  . Not on file   Social History Narrative   Has 3 daughters   Has 2 sons    Family History  Problem Relation Age of Onset  . Colon cancer Neg Hx   . Liver disease Neg Hx     ROS:  [x]  Positive   [ ]  Negative   [ ]  All sytems reviewed and are negative  Cardiovascular: []  chest pain/pressure []  palpitations []  SOB lying flat []  DOE []  pain in legs while walking [x]  pain in legs at rest []  pain in legs at night [x]  non-healing ulcers []  hx of DVT [  x] swelling in legs  Pulmonary: []  productive cough []  asthma/wheezing []  home O2  Neurologic: []  weakness in []  arms []  legs []  numbness in []  arms []  legs []  hx of CVA []  mini stroke [] difficulty speaking or slurred speech []  temporary loss of vision in one eye []  dizziness  Hematologic: []  hx of cancer []  bleeding problems []  problems with blood clotting easily  Endocrine:   [x]  diabetes []  thyroid disease  GI []  vomiting blood []  blood in stool  GU: []  CKD/renal failure []  HD--[]  M/W/F or []  T/T/S []  burning with urination []  blood in urine  Psychiatric: []  anxiety []  depression  Musculoskeletal: []  arthritis [x]  joint pain  Integumentary: []  rashes [x]  ulcers  Constitutional: []  fever []  chills   Physical Examination  Filed Vitals:   05/11/14 1142  BP: 116/68  Pulse: 55  Temp: 98 F (36.7 C)  Resp:    Body mass index is 30.75 kg/(m^2).  General:  WD obese male in NAD Gait: Not observed HENT: WNL, normocephalic Eyes: Pupils equal Pulmonary: normal non-labored breathing, without Rales, rhonchi,  wheezing Cardiac: regular, without  Murmurs, rubs or gallops; without carotid bruits Abdomen: soft, NT/ND, no masses Skin: without rashes, with ulcers  Vascular Exam/Pulses:  Right Left  Radial 2+ (normal) 2+ (normal)  Femoral 1-2+ 1-2+  Popliteal Non palpable Non palpable  DP Non palpable Non palpable  PT Non palpable Non palpable   Extremities: Duskiness of left distal foot with dry ulceration of left great toe and 2nd left toe. No drainage or cellulitis.  Musculoskeletal: no muscle wasting or atrophy  Neurologic: A&O X 3; Appropriate Affect ; SENSATION: normal; MOTOR  FUNCTION:  moving all extremities equally. Speech is fluent/normal   CBC    Component Value Date/Time   WBC 7.3 05/11/2014 0630   RBC 4.39 05/11/2014 0630   HGB 13.4 05/11/2014 0630   HCT 41.1 05/11/2014 0630   PLT 204 05/11/2014 0630   MCV 93.6 05/11/2014 0630   MCH 30.5 05/11/2014 0630   MCHC 32.6 05/11/2014 0630   RDW 14.7 05/11/2014 0630   LYMPHSABS 2.4 05/07/2014 2155   MONOABS 0.7 05/07/2014 2155   EOSABS 0.6 05/07/2014 2155   BASOSABS 0.1 05/07/2014 2155    BMET    Component Value Date/Time   NA 140 05/10/2014 0229   K 4.5 05/10/2014 0229   CL 104 05/10/2014 0229   CO2 27 05/10/2014 0229   GLUCOSE 123* 05/10/2014 0229   BUN 29* 05/10/2014 0229   CREATININE 1.05 05/10/2014 0229   CREATININE 1.11 12/13/2013 0955   CALCIUM 9.4 05/10/2014 0229   GFRNONAA 68* 05/10/2014 0229   GFRAA 79* 05/10/2014 0229    COAGS: Lab Results  Component Value Date   INR 1.51* 05/11/2014   INR 2.01* 05/10/2014   INR 2.84* 05/09/2014     Statin:  Yes.   Beta Blocker:  Yes.   Aspirin:  Yes.   ACEI:  Yes.   ARB:  No. Other antiplatelets/anticoagulants:  Yes.  Warfarin.    ASSESSMENT/PlAN: This is a 73 y.o. male with chronic ischemia of left foot with stable ulceration. He is not at risk for limb loss at this time.  His motor and sensory function are intact. He underwent angiography by Dr. [name] in June of 2014 which revealed one vessel runoff on the left via the posterior tibial which was diffusely diseased. He is not a candidate for revascularization. Definitive treatment would entail amputation. Continue local wound care. He may follow  up with Dr. [name] when returns next week. Please call as needed.    Virgina Jock, PA-C [facility] Office: [phone] Pager: [phone]  Agree with above assessment 2 small ulcerations left 1 on the first digit 1 on second digit each less than 1 cm in diameter. These are chronic and uninfected. No tenderness to palpation in the foot or calf. No  acute process. Patient has known chronic occlusive disease and not candidate for distal bypass Does not appear to have impending limb loss at the present time  Patient to follow up with Dr. [name] after his return

## 2014-05-11 NOTE — Consult Note (Signed)
Patient seen and examined with NP B. Ollis.  Lab, images, and vitals reviewed. Agree with NP B. Ollis assessment and plan with the following exceptions:  Patient noted to have a new mild right basilar infiltrate, possible aspiration event.  He also has some left lower atelectasis.  Start unasyn empirically for aspiration event.    Critical Care Time = 35 mins  Vilinda Boehringer, MD [facility] Pager [phone] On Call Pager [phone]

## 2014-05-11 NOTE — CV Procedure (Signed)
Cardiac Catheterization Operative Report  Tanner Pena [id number] 9/23/20151:51 PM [name], MD  Procedure Performed:  1. Left Heart Catheterization 2. Selective Coronary Angiography 3. SVG angiopraphy 4. LIMA graft angiography 5. Placement temporary transvenous pacemaker  Operator: Lauree Chandler, MD  Indication: 73 yo male with history of CAD s/p CABG in 1990 with redo bypass surgery in 1994 admitted with unstable angina. Cardiac markers negative. Last cath 2005 with 6 patent bypass grafts with high grade disease in the SVG to RCA treated with a Taxus DES. This procedure was complicated by perforation of the SVG in the area of stent placement.                                       Procedure Details: The risks, benefits, complications, treatment options, and expected outcomes were discussed with the patient. The patient and/or family concurred with the proposed plan, giving informed consent. The patient was brought to the cath lab after IV hydration was begun and oral premedication was given. The patient was further sedated with Versed and Fentanyl. The right groin was prepped and draped in the usual manner. Using the modified Seldinger access technique, a 5 French sheath was placed in the right femoral artery. I was unable to manipulate my catheters given his extensive calcification in the iliac system. I then placed a 45 cm 6 French sheath in the right femoral artery. A JL-5 catheter was used to engage the left main artery. He then had a coughing fit and bradycardia with several 5 seconds periods of complete AV block with no escape. I then placed a 6 French sheath in the right femoral vein. A temporary pacemaker was placed into the RV. He did not require pacing during the remainder of the case. At this time, he became dyspneic and began to have respiratory distress. His oxygen saturations began to drop and at this time he could not protect his airway. Anesthesia was  called and he was intubated. I then performed selective angiography of the RCA, both vein grafts with the JR4 catheter. The LIMA graft was engaged with the IMA catheter. The mechanical aortic valve was not crossed.  After intubation followed by the administration of paralytic agents and sedatives, he became hypotensive. Levophed drip was started. He was found to have patent LIMA to mid LAD, patent vein grafts to OM system and severe disease in the vein graft to the RCA.   There were no immediate complications. The patient was taken to the recovery area in stable condition.   Hemodynamic Findings: Central aortic pressure: 111/42  Angiographic Findings:  Left main: Diffuse 30% stenosis.   Left Anterior Descending Artery: Large caliber vessel that courses to the apex. The proximal vessel has diffuse 40% stenosis. The mid vessel has diffuse 50% stenosis followed by 100% occlusion. The mid and distal vessel fills from the patent IMA graft.   Circumflex Artery: 100% proximal occlusion. There are 4 obtuse marginal branches that fill from the patent vein graft.   Right Coronary Artery: 100% ostial occlusion.   Graft Anatomy:  SVG to RCA has a stent extending from the ostium down into the mid body of the vein graft. The proximal edge of the stent appears to have 70% restenosis with this lesion extending back to the ostium. There are two severe focal stenoses in the stented segment. The mid segment has a 90% stenosis and  the distal edge of the stent has a focal 90% stenosis.  SVG sequential to OM1, OM2, OM3, OM4 is patent LIMA to mid LAD is patent  Left Ventricular Angiogram: Deferred. Aortic valve not crossed due to mechanical valve.   Impression: 1. Triple vessel CAD s/p 6V CABG with 6/6 patent grafts 2. Unstable angina secondary to severe restenosis in the stented segment of the SVG to RCA 3. Respiratory failure during the diagnostic cath requiring intubation/mechanical ventilation 4.  Hypotension following intubation due to paralytic agents/sedatives.   Recommendations: His vein graft to the RCA will require PCI with coverage of the entire stented segment of the vein graft from the ostium down into the mid body of the vein graft. Given his respiratory failure, presumably from volume overload, and hypotension with sedative medications as well as inability to give oral anti-platelet agents, will delay PCI of the SVG to the RCA. PCCM has assisted in the cath lab with ventilator management. Hopefully can extubate in am and then be loaded with oral anti-platelet agents. PCI of the SVG to RCA can be planned when he is extubated and hemodynamically stable following anti-platelet loading. Would start IV heparin 8 hours post sheath pull. With need for long term coumadin for mechanical AVR, would use Plavix as the oral anti-platelet agent.        Complications:  Respiratory failure due to presumed volume overload, hypotension due to sedative agents following intubation. No cardiac complications.

## 2014-05-11 NOTE — Progress Notes (Addendum)
PROGRESS NOTE  Subjective:   73 yo with hx of CAD , AVR  Admitted in transfer from [facility] with symptoms of unstable angina, Similar to his previous episodes of angina. The episode was preceded on one occasion by a greasy chicken dinner. He states that symptoms were very somewhat to his previous episodes of angina. He did not try nitroglycerin because he had run out.  He has had ischemic changes in his left foot for 18 months.  Very tender.   Objective:    Vital Signs:   Temp:  [97.5 F (36.4 C)-98.3 F (36.8 C)] 98.1 F (36.7 C) (09/23 0715) Pulse Rate:  [43-164] 51 (09/23 0800) Resp:  [14-27] 22 (09/23 0800) BP: (92-147)/(22-109) 117/34 mmHg (09/23 0800) SpO2:  [92 %-100 %] 98 % (09/23 0800) Weight:  [214 lb 4.6 oz (97.2 kg)] 214 lb 4.6 oz (97.2 kg) (09/23 0600)  Last BM Date: 05/09/14   24-hour weight change: Weight change: 1 lb 15.7 oz (0.9 kg)  Weight trends: Filed Weights   05/09/14 0500 05/09/14 1330 05/11/14 0600  Weight: 215 lb 9.8 oz (97.8 kg) 212 lb 4.9 oz (96.3 kg) 214 lb 4.6 oz (97.2 kg)    Intake/Output:  09/22 0701 - 09/23 0700 In: 1403.8 [P.O.:720; I.V.:683.8] Out: 2125 [Urine:2125] Total I/O In: 405 [I.V.:405] Out: 200 [Urine:200]   Physical Exam: BP 117/34  Pulse 51  Temp(Src) 98.1 F (36.7 C) (Oral)  Resp 22  Ht 5\' 10"  (1.778 m)  Wt 214 lb 4.6 oz (97.2 kg)  BMI 30.75 kg/m2  SpO2 98%  Wt Readings from Last 3 Encounters:  05/11/14 214 lb 4.6 oz (97.2 kg)  05/11/14 214 lb 4.6 oz (97.2 kg)  01/19/14 212 lb (96.163 kg)    General: Vital signs reviewed and noted.   Head: Normocephalic, atraumatic.  Eyes: conjunctivae/corneas clear.  EOM's intact.   Throat: normal  Neck:  normal   Lungs:    clear  Heart:  Irreg. Mechanical S2  Abdomen:  Soft, non-tender, non-distended    Extremities: Trace edema on right, 1+ on left.  .  His left great toe . 2nd and 3rd toes appear to be ischemic.    Neurologic: A&O X3, CN II - XII are grossly  intact.   Psych: Normal     Labs: BMET:  Recent Labs  05/09/14 0447 05/10/14 0229  NA 142 140  K 4.4 4.5  CL 105 104  CO2 26 27  GLUCOSE 114* 123*  BUN 21 29*  CREATININE 1.04 1.05  CALCIUM 9.6 9.4    Liver function tests: No results found for this basename: AST, ALT, ALKPHOS, BILITOT, PROT, ALBUMIN,  in the last 72 hours No results found for this basename: LIPASE, AMYLASE,  in the last 72 hours  CBC:  Recent Labs  05/10/14 0229 05/11/14 0630  WBC 7.3 7.3  HGB 12.4* 13.4  HCT 37.8* 41.1  MCV 91.1 93.6  PLT 207 204    Cardiac Enzymes:  Recent Labs  05/08/14 1128  TROPONINI <0.30    Coagulation Studies:  Recent Labs  05/09/14 0447 05/10/14 0229 05/11/14 0630  LABPROT 29.8* 22.8* 18.2*  INR 2.84* 2.01* 1.51*    Other: No components found with this basename: POCBNP,  No results found for this basename: DDIMER,  in the last 72 hours  Recent Labs  05/09/14 0447  HGBA1C 6.8*    Recent Labs  05/09/14 0530  CHOL 123  HDL 29*  LDLCALC 62  TRIG  161*  CHOLHDL 4.2    Recent Labs  05/09/14 0446  TSH 1.940   No results found for this basename: VITAMINB12, FOLATE, FERRITIN, TIBC, IRON, RETICCTPCT,  in the last 72 hours   Other results:  EKG :  NSR, LBBB Telemetry: Normal sinus rhythm. He is frequent premature ventricular contractions. Medications:    Infusions: . sodium chloride 75 mL/hr at 05/11/14 0800  . sodium chloride 10 mL/hr (05/10/14 1445)  . heparin 1,500 Units/hr (05/11/14 0800)    Scheduled Medications: . allopurinol  300 mg Oral QHS  . amLODipine  5 mg Oral q morning - 10a  . aspirin  81 mg Oral Pre-Cath  . aspirin EC  81 mg Oral QHS  . atorvastatin  80 mg Oral q1800  . fenofibrate  160 mg Oral Daily  . furosemide  40 mg Oral Daily  . glimepiride  1 mg Oral BID WC  . insulin aspart  0-9 Units Subcutaneous TID WC  . ipratropium  0.5 mg Nebulization TID PC  . isosorbide mononitrate  45 mg Oral Daily  . levalbuterol   0.63 mg Nebulization TID  . lisinopril  2.5 mg Oral Daily  . metoprolol succinate  50 mg Oral Daily  . omega-3 acid ethyl esters  1 g Oral BID  . potassium chloride SA  20 mEq Oral Daily  . povidone-iodine   Topical Q1200  . pregabalin  50 mg Oral QHS  . 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 Unstable angina: The patient has a history of coronary artery disease. If symptoms that were consistent with angina. He started with exertion and seemed to worsen as he walked around. His troponin levels are negative. He's currently pain-free.  He was originally scheduled for cardiac catheterization but given the fact that his INR is greater than 2, we will delay the procedure until tomorrow. He'll be started on heparin for his mechanical aortic valve as we allow his Coumadin to drift down.  2. Aortic stenosis: Patient has a mechanical aortic valve. It sounds nice and crisp.  INR is 2.01.  Will delay cath until tomorrow.   3. Hyperlipidemia:  He is on atorvastatin 80 mg a day  4. Diabetes mellitus: Continue with his current diabetic medications  5. Bradycardia :  He is metoprolol 75 mg a day. We'll reduce his dose to 50 mg a day.  THS is ok.   6. Peripheral vascular disease:  He has severe PVD and has been followed by Dr.  [name]. His left foot appears to be ischemic. Will ask [facility] to see him to help with management. He will likely need PCI today.    Disposition:  Length of Stay: 4  Thayer Headings, Brooke Bonito., MD, [facility] 05/11/2014,  8:52 AM Office [phone] Pager [phone]   Addendum:  I have spoken with Dr. Kellie Simmering. The left foot is ischemic but is very stable and does not show any signs of needing surgery at any time soon.  Given this information, we should proceed with PCI using DES or BMS - whichever one will be best for the coronary without worrying about the duration of  DAPT.     Thayer Headings, Brooke Bonito., MD, [facility] 05/11/2014, 1:03 PM [address],  [place] Pager [phone]

## 2014-05-12 ENCOUNTER — Inpatient Hospital Stay (HOSPITAL_COMMUNITY): Payer: PRIVATE HEALTH INSURANCE

## 2014-05-12 DIAGNOSIS — I25810 Atherosclerosis of coronary artery bypass graft(s) without angina pectoris: Secondary | ICD-10-CM

## 2014-05-12 DIAGNOSIS — I10 Essential (primary) hypertension: Secondary | ICD-10-CM

## 2014-05-12 DIAGNOSIS — J9600 Acute respiratory failure, unspecified whether with hypoxia or hypercapnia: Secondary | ICD-10-CM

## 2014-05-12 DIAGNOSIS — F17200 Nicotine dependence, unspecified, uncomplicated: Secondary | ICD-10-CM

## 2014-05-12 DIAGNOSIS — I5043 Acute on chronic combined systolic (congestive) and diastolic (congestive) heart failure: Secondary | ICD-10-CM

## 2014-05-12 DIAGNOSIS — I509 Heart failure, unspecified: Secondary | ICD-10-CM

## 2014-05-12 LAB — HEPARIN LEVEL (UNFRACTIONATED)
HEPARIN UNFRACTIONATED: 0.42 [IU]/mL (ref 0.30–0.70)
Heparin Unfractionated: 0.42 IU/mL (ref 0.30–0.70)

## 2014-05-12 LAB — CBC
HCT: 40.4 % (ref 39.0–52.0)
HEMOGLOBIN: 13.6 g/dL (ref 13.0–17.0)
MCH: 30 pg (ref 26.0–34.0)
MCHC: 33.7 g/dL (ref 30.0–36.0)
MCV: 89 fL (ref 78.0–100.0)
PLATELETS: 286 10*3/uL (ref 150–400)
RBC: 4.54 MIL/uL (ref 4.22–5.81)
RDW: 14.4 % (ref 11.5–15.5)
WBC: 16.3 10*3/uL — AB (ref 4.0–10.5)

## 2014-05-12 LAB — BLOOD GAS, ARTERIAL
Acid-base deficit: 3.7 mmol/L — ABNORMAL HIGH (ref 0.0–2.0)
Bicarbonate: 20.4 mEq/L (ref 20.0–24.0)
DRAWN BY: 369891
FIO2: 0.4 %
LHR: 22 {breaths}/min
O2 Saturation: 98.2 %
PATIENT TEMPERATURE: 98.6
PCO2 ART: 34.2 mmHg — AB (ref 35.0–45.0)
PEEP: 5 cmH2O
TCO2: 21.4 mmol/L (ref 0–100)
VT: 500 mL
pH, Arterial: 7.393 (ref 7.350–7.450)
pO2, Arterial: 116 mmHg — ABNORMAL HIGH (ref 80.0–100.0)

## 2014-05-12 LAB — BASIC METABOLIC PANEL
ANION GAP: 16 — AB (ref 5–15)
Anion gap: 14 (ref 5–15)
BUN: 35 mg/dL — ABNORMAL HIGH (ref 6–23)
BUN: 37 mg/dL — AB (ref 6–23)
CALCIUM: 9.1 mg/dL (ref 8.4–10.5)
CO2: 21 mEq/L (ref 19–32)
CO2: 21 mEq/L (ref 19–32)
CREATININE: 1.71 mg/dL — AB (ref 0.50–1.35)
Calcium: 9.1 mg/dL (ref 8.4–10.5)
Chloride: 102 mEq/L (ref 96–112)
Chloride: 102 mEq/L (ref 96–112)
Creatinine, Ser: 1.58 mg/dL — ABNORMAL HIGH (ref 0.50–1.35)
GFR calc Af Amer: 48 mL/min — ABNORMAL LOW (ref >90)
GFR, EST AFRICAN AMERICAN: 44 mL/min — AB (ref >90)
GFR, EST NON AFRICAN AMERICAN: 38 mL/min — AB (ref >90)
GFR, EST NON AFRICAN AMERICAN: 42 mL/min — AB (ref >90)
GLUCOSE: 188 mg/dL — AB (ref 70–99)
GLUCOSE: 216 mg/dL — AB (ref 70–99)
POTASSIUM: 4.6 meq/L (ref 3.7–5.3)
POTASSIUM: 5 meq/L (ref 3.7–5.3)
SODIUM: 139 meq/L (ref 137–147)
Sodium: 137 mEq/L (ref 137–147)

## 2014-05-12 LAB — GLUCOSE, CAPILLARY
Glucose-Capillary: 126 mg/dL — ABNORMAL HIGH (ref 70–99)
Glucose-Capillary: 132 mg/dL — ABNORMAL HIGH (ref 70–99)
Glucose-Capillary: 164 mg/dL — ABNORMAL HIGH (ref 70–99)
Glucose-Capillary: 181 mg/dL — ABNORMAL HIGH (ref 70–99)
Glucose-Capillary: 191 mg/dL — ABNORMAL HIGH (ref 70–99)
Glucose-Capillary: 211 mg/dL — ABNORMAL HIGH (ref 70–99)

## 2014-05-12 LAB — PROTIME-INR
INR: 1.38 (ref 0.00–1.49)
PROTHROMBIN TIME: 17 s — AB (ref 11.6–15.2)

## 2014-05-12 LAB — MAGNESIUM: MAGNESIUM: 1.8 mg/dL (ref 1.5–2.5)

## 2014-05-12 MED ORDER — MAGNESIUM SULFATE 40 MG/ML IJ SOLN
2.0000 g | Freq: Once | INTRAMUSCULAR | Status: AC
  Administered 2014-05-12: 2 g via INTRAVENOUS
  Filled 2014-05-12: qty 50

## 2014-05-12 NOTE — Consult Note (Signed)
PULMONARY / CRITICAL CARE MEDICINE   Name: Tanner Pena MRN: [id number] DOB: 11/19/1940    ADMISSION DATE:  05/07/2014 CONSULTATION DATE:  05/11/14  REFERRING MD :  Dr. Radford Pax   CHIEF COMPLAINT:  Chest pain  INITIAL PRESENTATION:  73 y/o M admitted with chest pain from [facility].  Tx to [facility] on 9/21.  Underwent cardiac cath on 9/23 with non-critical disease.  Suffered respiratory distress (pulmonary edema) during cath requiring intubation.  PCCM consulted.   STUDIES:  9/23 LHC >>  SIGNIFICANT EVENTS: 9/19  Admitted through [facility] ED with chest pain 9/21  Transferred to [facility] in preparation for heart cath 9/23  Heart catheterization during which patient experienced acute respiratory distress requiring intubation   HISTORY OF PRESENT ILLNESS:  73 yo M smoker with a PMH of DM, HTN, CAD, PVD (with LE stents), OSA, COPD, s/p aortic valve replacement and CABG x 2 who was admitted through the ED at [facility] 9/19 for chest pain.  His first episode occurred on 9/18 while he was attempting to start his 4 wheeler.  He described substernal burning and pain, but denied dyspnea, diaphoresis, or weakness.  The pain subsided in about one hour without medications.  The second occurrence of chest pain was on 9/19 as he was eating [employer] fried chicken dinner.  Again the pain was substernal burning with the addition of an ache that radiates to his jaw, neck and down the inside of both arms.  He also reported increased dyspnea, but no diaphoresis.  After his second episode, he sought care in the ER.  He was seen by Cardiology, and diagnosed with unstable angina.  Troponin levels were negative and EKG was inconclusive for ischemia due to chronic LBBB.   On 9/21, he was transferred to [facility] due to concern for underlying ischemia and the need for definitive evaluation with heart cath.  On 9/22, he was evaluated by [facility] Cardiologist who recommended catheterization delay due to INR greater than 2.  He was started on heparin for his  mechanical aortic valve in order to allow his INR to decrease.  Meanwhile, Vascular surgery was also consulted due to chronic L LE ischemic changes and recommended local wound care and outpatient follow up.  The cardiac catheterization occured 9/23 and during the initial phase of the procedure, the patient experienced acute respiratory distress requiring intubation.  Paralytics, sedatives, and levophed were started in the cath lab while the procedure was completed. No critical lesions were noted.  PCCM was consulted and the patient was transferred back to the ICU.   SUBJECTIVE:  Patient doing well this morning. Amiodarone started lastnight for multiple PVCs but stopped this morning by Cardiology.  Currently on sedation holiday, somnolent but easily arousable, gesturing for removal of ETT.  VITAL SIGNS: Temp:  [97.9 F (36.6 C)-99 F (37.2 C)] 98.5 F (36.9 C) (09/24 0800) Pulse Rate:  [28-123] 46 (09/24 1000) Resp:  [20-25] 22 (09/24 1000) BP: (113-143)/(36-68) 115/42 mmHg (09/24 1000) SpO2:  [94 %-100 %] 99 % (09/24 1000) Arterial Line BP: (84-185)/(31-73) 124/41 mmHg (09/24 1000) FiO2 (%):  [40 %-60 %] 40 % (09/24 1000) Weight:  [216 lb 14.9 oz (98.4 kg)] 216 lb 14.9 oz (98.4 kg) (09/24 0400)  HEMODYNAMICS:    VENTILATOR SETTINGS: Vent Mode:  [-] PRVC FiO2 (%):  [40 %-60 %] 40 % Set Rate:  [22 bmp] 22 bmp Vt Set:  [500 mL] 500 mL PEEP:  [5 cmH20] 5 cmH20 Plateau Pressure:  [16 cmH20-22 cmH20] 16  cmH20  INTAKE / OUTPUT:  Intake/Output Summary (Last 24 hours) at 05/12/14 1058 Last data filed at 05/12/14 1000  Gross per 24 hour  Intake 1539.1 ml  Output    600 ml  Net  939.1 ml    PHYSICAL EXAMINATION: General:  Chronically ill in NAD Neuro:  AAOx4, speech clear, MAE HEENT:  Mm pink/moist, OETT Cardiovascular:  s1s2 irregular, ST with PVC's on monitor  Lungs:  resp's even/non-labored, dec BS bilaterally Abdomen:  Rounds/soft, bsx4 active  Musculoskeletal:  No acute  deformities  Skin:  Warm upper extremities, LE's cool with ulcerations  LABS:  CBC  Recent Labs Lab 05/10/14 0229 05/11/14 0630 05/12/14 0415  WBC 7.3 7.3 16.3*  HGB 12.4* 13.4 13.6  HCT 37.8* 41.1 40.4  PLT 207 204 286   Coag's  Recent Labs Lab 05/09/14 0447 05/10/14 0229 05/11/14 0630  INR 2.84* 2.01* 1.51*   BMET  Recent Labs Lab 05/11/14 1623 05/11/14 2359 05/12/14 0851  NA 139 139 137  K 4.6 5.0 4.6  CL 102 102 102  CO2 25 21 21   BUN 31* 35* 37*  CREATININE 1.39* 1.58* 1.71*  GLUCOSE 222* 216* 188*   Electrolytes  Recent Labs Lab 05/07/14 2155 05/08/14 0348  05/11/14 1623 05/11/14 2359 05/12/14 0851  CALCIUM 10.2  --   < > 9.1 9.1 9.1  MG  --  2.0  --   --  1.8  --   < > = values in this interval not displayed. Sepsis Markers No results found for this basename: LATICACIDVEN, PROCALCITON, O2SATVEN,  in the last 168 hours  ABG  Recent Labs Lab 05/11/14 1502 05/11/14 1747 05/12/14 0425  PHART 7.165* 7.265* 7.393  PCO2ART 70.9* 50.9* 34.2*  PO2ART 349.0* 81.0 116.0*    Liver Enzymes  Recent Labs Lab 05/07/14 2155  AST 31  ALT 17  ALKPHOS 32*  BILITOT 0.6  ALBUMIN 4.4   Cardiac Enzymes  Recent Labs Lab 05/07/14 2155 05/08/14 0352 05/08/14 1128 05/10/14 0229 05/11/14 1530  TROPONINI <0.30 <0.30 <0.30  --   --   PROBNP  --   --   --  820.3* 1302.0*   Glucose  Recent Labs Lab 05/11/14 1151 05/11/14 1746 05/11/14 2146 05/12/14 0021 05/12/14 0343 05/12/14 0819  GLUCAP 138* 216* 205* 191* 211* 181*    Imaging Dg Chest Port 1 View  05/11/2014   CLINICAL DATA:  Intubation, endotracheal tube placement, history COPD, diabetes, hypertension, AVR  EXAM: PORTABLE CHEST - 1 VIEW  COMPARISON:  Portable exam 1649 hr compared to 05/07/2014  FINDINGS: Tip of endotracheal tube projects 4.1 cm above carinal.  Nasogastric tube extends into stomach.  Enlargement of cardiac silhouette with pulmonary vascular congestion.   Atherosclerotic calcification aorta.  Question RIGHT jugular central venous catheter tip projecting over confluence with innominate vein.  Atelectasis versus consolidation in LEFT lower lobe.  Mild RIGHT basilar infiltrate.  Biapical scarring.  No pleural effusion or pneumothorax.  IMPRESSION: Enlargement of cardiac silhouette with pulmonary vascular congestion post median sternotomy, CABG gait AVR.  Persistent atelectasis versus consolidation in LEFT lower lobe with mild RIGHT basilar infiltrate.   Electronically Signed   By: Lavonia Dana M.D.   On: 05/11/2014 17:13   Dg Abd Portable 1v  05/11/2014   CLINICAL DATA:  Orogastric tube placement  EXAM: PORTABLE ABDOMEN - 1 VIEW  COMPARISON:  None.  FINDINGS: Orogastric tube is identified crossing the gastroesophageal junction, are with side hole and tip projecting over the proximal body  of the stomach.  IMPRESSION: NG tube projects over the stomach.   Electronically Signed   By: Skipper Cliche M.D.   On: 05/11/2014 18:34   Dg Abd Portable 1v  05/11/2014   CLINICAL DATA:  OG tube placement.  EXAM: PORTABLE ABDOMEN - 1 VIEW  COMPARISON:  None.  FINDINGS: Examination demonstrates an enteric tube which can be followed down to the level of the stomach in the left upper quadrant where the tip then becomes hazy and difficult to define. There is slight elevation of the left hemidiaphragm. Minimal opacification in the left base likely atelectasis. Bowel gas pattern is nonobstructive. Sternotomy wires present with fracture of the most inferior wire.  IMPRESSION: Nonobstructive gas pattern. Enteric tube which enters the region of the stomach in the left upper quadrant as tip is not definitely visualized.   Electronically Signed   By: Marin Olp M.D.   On: 05/11/2014 17:13     ASSESSMENT / PLAN:  PULMONARY OETT 9/23>> A: Acute Hypoxic Respiratory Distress Possible Aspiration - during respiratory distress in cath lab Pulmonary Edema - concern for flash pulmonary  edema during cardiac cath COPD OSA P:   MV wean per protocol Duonebs scheduled then PRN CHF team following with management on diuretics On PSV, will attempt extubation when more awake, hopefully this afternoon.  CARDIOVASCULAR CVL (veinous sheath R groin) 9/23>> A:  CAD - s/p LHC with non-critical disease. Hypotension - related to sedation, resolved.  PVC's / Tachycardia  Hx HTN PVD S/p aortic valve replacement Hx CABG x2 P:  Cardiology deferring management of CAD until pt stable ASA Heparin gtt per Cards Levophed gtt to maintain MAP >65 (off since return from Cath lab) Continue Lipitor, fenofibrate  Change toprol xl to IV dosing for now  Lasix per CHF team recs  RENAL A:  At Risk AKI  P:   Trend Sr Cr Ensure adequate perfusion   GASTROINTESTINAL A:   Obesity  Vent Associated Dysphagia  P:   SUP: Protonix  NPO OGT to LIS Consider TF in am if not extubated   HEMATOLOGIC A:   No acute issues  P:  Monitor CBC  INFECTIOUS A:   ? Aspiration PNA - new R mild basilar infiltrate 9/23 Unasyn P: Leukocytosis today Cont with Unasyn Day (1/x) Assess CXR to ensure no infiltrate  ENDOCRINE A:  DM  Gout P:   SSI  Continue allopurinol for now Hold amaryl  NEUROLOGIC A:  Peripheral neuropathy Chronic pain P:   RASS goal: -1 Fentanyl gtt PRN versed Continue lyrica     Family/patient updated:  By Day 3  Interdisciplinary Family Meeting v Palliative Care Meeting:  n/a By day 7   TODAY'S SUMMARY: 73 y/o M admitted with chest pain.  LHC on 9/23 with respiratory distress requiring intubation. Suspect pulmonary edema in addition to mild aspiration. CHF team following.  SBT, possible extubation this afternoon   CRITICAL CARE: The patient is critically ill with multiple organ systems failure and requires high complexity decision making for assessment and support, frequent evaluation and titration of therapies, application of advanced monitoring  technologies and extensive interpretation of multiple databases. Critical Care Time devoted to patient care services described in this note is 35 minutes.    Vilinda Boehringer, MD [facility] Pager [phone] On Call Pager [phone]

## 2014-05-12 NOTE — Consult Note (Signed)
Urology Consult   Physician requesting consult: Pulmonary critical care  Reason for consult: Urinary retention  History of Present Illness: Tanner Pena is a 73 y.o. male with multiple cardiac issues who suffered acute respiratory distress and pulmonary edema requiring intubation during cardiac catheterization for evaluation of chest pain. Unable to pass foley overnight. Condom catheter not staying on. Bladder scan for >318mL. Multiple RNs unable to pass foley so urology consulted.   Past Medical History  Diagnosis Date  . Diabetes mellitus 2007  . Hypertension   . Gout   . Hypercholesteremia   . Peripheral vascular disease     stents lower extremity  . Chronic pain     foot, hips  . Sleep apnea   . COPD (chronic obstructive pulmonary disease)   . 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 Chronic back pain   . Dysphagia   . Neuromuscular disorder   . Peripheral neuropathy   . Critical lower limb ischemia     Past Surgical History  Procedure Laterality Date  . Back surgery  4097,3532    2  . Open heart surgery  1990    prosthetic heart valve, one bypass  . Rotator cuff repair      right  . Cataract extraction      bilateral  . Coronary stent placement  2005    RCA vein graft A 3.0x13.0 TAXUS stent was then placed int he vessel a Viva 3.0x4.0 (perfusion balloon was made ready it was placed through the entire lenght of the stent  . Peripheral vascular procedures lower extremities      right external iliac  artery PTA and stenting as well as bilateral SFA intervention remotely. Repeat procedures in 2011 bilaterally  . Coronary artery bypass graft  1994    6 vessels  . [name]  06/13/2011    Procedure: Venia Minks DILATION;  Surgeon: [name], MD;  Location: [facility];  Service: Endoscopy;  Laterality: N/A;  Dilated to 56.   . Angioplasty illiac artery      Medications:  Home meds:    Medication List    ASK your doctor about these medications      albuterol 4 MG 12 hr tablet  Commonly known as:  VOSPIRE ER  Take 4 mg by mouth 2 (two) times daily.     albuterol-ipratropium 18-103 MCG/ACT inhaler  Commonly known as:  COMBIVENT  Inhale 1 puff into the lungs 4 (four) times daily. Coughing/ Shortness of Breath     allopurinol 300 MG tablet  Commonly known as:  ZYLOPRIM  Take 300 mg by mouth at bedtime.     amLODipine 5 MG tablet  Commonly known as:  NORVASC  Take 5 mg by mouth every morning.     aspirin EC 81 MG tablet  Take 81 mg by mouth at bedtime.     fenofibrate 145 MG tablet  Commonly known as:  TRICOR  Take 1 tablet (145 mg total) by mouth daily.     fish oil-omega-3 fatty acids 1000 MG capsule  Take 1 capsule (1 g total) by mouth 2 (two) times daily.     furosemide 80 MG tablet  Commonly known as:  LASIX  Take 40 mg by mouth daily.     glimepiride 1 MG tablet  Commonly known as:  AMARYL  Take 1 mg by mouth 2 (two) times daily.     isosorbide mononitrate 30 MG 24 hr tablet  Commonly  known as:  IMDUR  Take 1 tablet (30 mg total) by mouth daily.     metoprolol succinate 50 MG 24 hr tablet  Commonly known as:  TOPROL-XL  Take 75 mg by mouth daily. Take with or immediately following a meal.     oxyCODONE-acetaminophen 10-325 MG per tablet  Commonly known as:  PERCOCET  Take 1 tablet by mouth every 6 (six) hours as needed for pain.     potassium chloride SA 20 MEQ tablet  Commonly known as:  K-DUR,KLOR-CON  Take 20 mEq by mouth daily.     pregabalin 50 MG capsule  Commonly known as:  LYRICA  Take 50 mg by mouth 2 (two) times daily.     ramipril 5 MG capsule  Commonly known as:  ALTACE  Take 2.5-5 mg by mouth 2 (two) times daily. Take 1 tablet in the morning and 1/2 tablet at bedtime.     rosuvastatin 20 MG tablet  Commonly known as:  CRESTOR  Take 20 mg by mouth at bedtime.     sitaGLIPtin 50 MG tablet  Commonly known as:  JANUVIA  Take 50 mg by mouth daily.     warfarin 5 MG tablet  Commonly  known as:  COUMADIN  Take 5-7.5 mg by mouth at bedtime. Takes one and one-half tablet (7.5mg  total) on Mondays and Fridays. Takes one tablet (5mg  total) on all other days        Scheduled Meds: . allopurinol  300 mg Oral QHS  . amiodarone      . amLODipine  5 mg Oral q morning - 10a  . ampicillin-sulbactam (UNASYN) IV  3 g Intravenous Q8H  . antiseptic oral rinse  7 mL Mouth Rinse QID  . aspirin  81 mg Oral QHS  . atorvastatin  80 mg Oral q1800  . chlorhexidine  15 mL Mouth Rinse BID  . fenofibrate  160 mg Oral Daily  . fentaNYL  50 mcg Intravenous Once  . furosemide  40 mg Intravenous Q12H  . insulin aspart  0-9 Units Subcutaneous 6 times per day  . ipratropium  0.5 mg Nebulization TID PC  . isosorbide mononitrate  45 mg Oral Daily  . levalbuterol  0.63 mg Nebulization Q6H  . lisinopril  2.5 mg Oral Daily  . metoprolol  5 mg Intravenous 4 times per day  . omega-3 acid ethyl esters  1 g Oral BID  . pantoprazole (PROTONIX) IV  40 mg Intravenous Q24H  . potassium chloride SA  20 mEq Oral Daily  . povidone-iodine   Topical Q1200  . pregabalin  50 mg Oral QHS   Continuous Infusions: . sodium chloride 10 mL/hr at 05/11/14 1800  . amiodarone 30 mg/hr (05/12/14 0351)  . fentaNYL infusion INTRAVENOUS 50 mcg/hr (05/11/14 1841)  . heparin 1,500 Units/hr (05/11/14 2340)  . midazolam (VERSED) infusion 2 mg/hr (05/12/14 0542)  . norepinephrine (LEVOPHED) Adult infusion 17 mcg/min (05/12/14 0617)   PRN Meds:.acetaminophen, docusate, docusate, fentaNYL, hydrALAZINE, midazolam, nitroGLYCERIN, ondansetron (ZOFRAN) IV  Allergies: No Known Allergies  Family History  Problem Relation Age of Onset  . Colon cancer Neg Hx   . Liver disease Neg Hx     Social History:  reports that he has been smoking Cigarettes.  He started smoking about 60 years ago. He has a 55 pack-year smoking history. He quit smokeless tobacco use about 18 years ago. His smokeless tobacco use included Chew. He reports  that he drinks alcohol. He reports that he does not  use illicit drugs.  ROS: Unable to perform.  Physical Exam:  Vital signs in last 24 hours: Temp:  [97.9 F (36.6 C)-99 F (37.2 C)] 98.6 F (37 C) (09/24 0345) Pulse Rate:  [28-123] 74 (09/24 0403) Resp:  [17-22] 22 (09/24 0403) BP: (113-125)/(34-68) 114/41 mmHg (09/24 0403) SpO2:  [94 %-100 %] 100 % (09/24 0403) Arterial Line BP: (84-185)/(31-73) 100/42 mmHg (09/24 0400) FiO2 (%):  [40 %-60 %] 40 % (09/24 0403) Weight:  [98.4 kg (216 lb 14.9 oz)] 98.4 kg (216 lb 14.9 oz) (09/24 0400) Constitutional:  Intubated Cardiovascular: Regular rate and rhythm, No JVD Respiratory: Normal respiratory effort, Lungs clear bilaterally GI: Abdomen is soft, nontender, nondistended, no abdominal masses Genitourinary: No CVAT. Normal male phallus, testes are descended bilaterally and non-tender and without masses, scrotum is normal in appearance without lesions or masses, perineum is normal on inspection. Lymphatic: No lymphadenopathy Neurologic: Grossly intact, no focal deficits Psychiatric: Normal mood and affect  Laboratory Data:   Recent Labs  05/10/14 0229 05/11/14 0630 05/12/14 0415  WBC 7.3 7.3 16.3*  HGB 12.4* 13.4 13.6  HCT 37.8* 41.1 40.4  PLT 207 204 286     Recent Labs  05/10/14 0229 05/11/14 1623 05/11/14 2359  NA 140 139 139  K 4.5 4.6 5.0  CL 104 102 102  GLUCOSE 123* 222* 216*  BUN 29* 31* 35*  CALCIUM 9.4 9.1 9.1  CREATININE 1.05 1.39* 1.58*     Results for orders placed during the hospital encounter of 05/07/14 (from the past 24 hour(s))  GLUCOSE, CAPILLARY     Status: Abnormal   Collection Time    05/11/14 11:51 AM      Result Value Ref Range   Glucose-Capillary 138 (*) 70 - 99 mg/dL   Comment 1 Capillary Sample    POCT I-STAT 3, ART BLOOD GAS (G3+)     Status: Abnormal   Collection Time    05/11/14  3:02 PM      Result Value Ref Range   pH, Arterial 7.165 (*) 7.350 - 7.450   pCO2 arterial 70.9  (*) 35.0 - 45.0 mmHg   pO2, Arterial 349.0 (*) 80.0 - 100.0 mmHg   Bicarbonate 25.6 (*) 20.0 - 24.0 mEq/L   TCO2 28  0 - 100 mmol/L   O2 Saturation 100.0     Acid-base deficit 5.0 (*) 0.0 - 2.0 mmol/L   Sample type ARTERIAL     Comment NOTIFIED PHYSICIAN    PRO B NATRIURETIC PEPTIDE     Status: Abnormal   Collection Time    05/11/14  3:30 PM      Result Value Ref Range   Pro B Natriuretic peptide (BNP) 1302.0 (*) 0 - 125 pg/mL  BASIC METABOLIC PANEL     Status: Abnormal   Collection Time    05/11/14  4:23 PM      Result Value Ref Range   Sodium 139  137 - 147 mEq/L   Potassium 4.6  3.7 - 5.3 mEq/L   Chloride 102  96 - 112 mEq/L   CO2 25  19 - 32 mEq/L   Glucose, Bld 222 (*) 70 - 99 mg/dL   BUN 31 (*) 6 - 23 mg/dL   Creatinine, Ser 1.39 (*) 0.50 - 1.35 mg/dL   Calcium 9.1  8.4 - 10.5 mg/dL   GFR calc non Af Amer 49 (*) >90 mL/min   GFR calc Af Amer 56 (*) >90 mL/min   Anion gap 12  5 -  15  GLUCOSE, CAPILLARY     Status: Abnormal   Collection Time    05/11/14  5:46 PM      Result Value Ref Range   Glucose-Capillary 216 (*) 70 - 99 mg/dL   Comment 1 Arterial Sample    POCT I-STAT 3, ART BLOOD GAS (G3+)     Status: Abnormal   Collection Time    05/11/14  5:47 PM      Result Value Ref Range   pH, Arterial 7.265 (*) 7.350 - 7.450   pCO2 arterial 50.9 (*) 35.0 - 45.0 mmHg   pO2, Arterial 81.0  80.0 - 100.0 mmHg   Bicarbonate 23.0  20.0 - 24.0 mEq/L   TCO2 25  0 - 100 mmol/L   O2 Saturation 94.0     Acid-base deficit 5.0 (*) 0.0 - 2.0 mmol/L   Patient temperature 99.0 F     Collection site ARTERIAL LINE     Sample type ARTERIAL    GLUCOSE, CAPILLARY     Status: Abnormal   Collection Time    05/11/14  9:46 PM      Result Value Ref Range   Glucose-Capillary 205 (*) 70 - 99 mg/dL   Comment 1 Capillary Sample    BASIC METABOLIC PANEL     Status: Abnormal   Collection Time    05/11/14 11:59 PM      Result Value Ref Range   Sodium 139  137 - 147 mEq/L   Potassium 5.0  3.7  - 5.3 mEq/L   Chloride 102  96 - 112 mEq/L   CO2 21  19 - 32 mEq/L   Glucose, Bld 216 (*) 70 - 99 mg/dL   BUN 35 (*) 6 - 23 mg/dL   Creatinine, Ser 1.58 (*) 0.50 - 1.35 mg/dL   Calcium 9.1  8.4 - 10.5 mg/dL   GFR calc non Af Amer 42 (*) >90 mL/min   GFR calc Af Amer 48 (*) >90 mL/min   Anion gap 16 (*) 5 - 15  MAGNESIUM     Status: None   Collection Time    05/11/14 11:59 PM      Result Value Ref Range   Magnesium 1.8  1.5 - 2.5 mg/dL  GLUCOSE, CAPILLARY     Status: Abnormal   Collection Time    05/12/14 12:21 AM      Result Value Ref Range   Glucose-Capillary 191 (*) 70 - 99 mg/dL   Comment 1 Capillary Sample    GLUCOSE, CAPILLARY     Status: Abnormal   Collection Time    05/12/14  3:43 AM      Result Value Ref Range   Glucose-Capillary 211 (*) 70 - 99 mg/dL  CBC     Status: Abnormal   Collection Time    05/12/14  4:15 AM      Result Value Ref Range   WBC 16.3 (*) 4.0 - 10.5 K/uL   RBC 4.54  4.22 - 5.81 MIL/uL   Hemoglobin 13.6  13.0 - 17.0 g/dL   HCT 40.4  39.0 - 52.0 %   MCV 89.0  78.0 - 100.0 fL   MCH 30.0  26.0 - 34.0 pg   MCHC 33.7  30.0 - 36.0 g/dL   RDW 14.4  11.5 - 15.5 %   Platelets 286  150 - 400 K/uL  BLOOD GAS, ARTERIAL     Status: Abnormal   Collection Time    05/12/14  4:25 AM  Result Value Ref Range   FIO2 0.40     Delivery systems VENTILATOR     Mode PRESSURE REGULATED VOLUME CONTROL     VT 500     Rate 22     Peep/cpap 5.0     pH, Arterial 7.393  7.350 - 7.450   pCO2 arterial 34.2 (*) 35.0 - 45.0 mmHg   pO2, Arterial 116.0 (*) 80.0 - 100.0 mmHg   Bicarbonate 20.4  20.0 - 24.0 mEq/L   TCO2 21.4  0 - 100 mmol/L   Acid-base deficit 3.7 (*) 0.0 - 2.0 mmol/L   O2 Saturation 98.2     Patient temperature 98.6     Collection site ARTERIAL LINE     Drawn by [id number]     Sample type ARTERIAL DRAW     Allens test (pass/fail) PASS  PASS   Recent Results (from the past 240 hour(s))  MRSA PCR SCREENING     Status: None   Collection Time     05/08/14 12:06 AM      Result Value Ref Range Status   MRSA by PCR NEGATIVE  NEGATIVE Final   Comment:            The GeneXpert MRSA Assay (FDA     approved for NASAL specimens     only), is one component of a     comprehensive MRSA colonization     surveillance program. It is not     intended to diagnose MRSA     infection nor to guide or     monitor treatment for     MRSA infections.  MRSA PCR SCREENING     Status: None   Collection Time    05/09/14 12:57 PM      Result Value Ref Range Status   MRSA by PCR NEGATIVE  NEGATIVE Final   Comment:            The GeneXpert MRSA Assay (FDA     approved for NASAL specimens     only), is one component of a     comprehensive MRSA colonization     surveillance program. It is not     intended to diagnose MRSA     infection nor to guide or     monitor treatment for     MRSA infections.    Renal Function:  Recent Labs  05/07/14 2155 05/09/14 0447 05/10/14 0229 05/11/14 1623 05/11/14 2359  CREATININE 1.12 1.04 1.05 1.39* 1.58*   Estimated Creatinine Clearance: 49 ml/min (by C-G formula based on Cr of 1.58).  Procedure note: Patient was prepped and draped in the usual sterile fashion. An adequately lubricated 18Fr coude catheter was advanced into the bladder. The passage was tight but no obstruction, stricture, or evidence of false passage. Clear yellow urine returned and the foley bag was attached.   Impression/Recommendation 64M with acute respiratory distress requiring intubation. Nursing staff unable to place foley so urology consulted.  Foley management per primary team. Remove when acute status improved.

## 2014-05-12 NOTE — Progress Notes (Signed)
396mls of urine per bladder scan. MD informed and urologist will be consulted today. Patient was incontinent of urine earlier on.

## 2014-05-12 NOTE — Procedures (Signed)
Extubation Procedure Note  Patient Details:   Name: ARMOND CUTHRELL DOB: 07/14/1941 MRN: [id number]   Airway Documentation:     Evaluation  O2 sats: stable throughout Complications: No apparent complications Patient did tolerate procedure well. Bilateral Breath Sounds: Diminished;Rhonchi Suctioning: Airway Yes, pt able to speak, no stridor noted.  No resp distress noted.   Lenna Sciara 05/12/2014, 5:39 PM

## 2014-05-12 NOTE — Progress Notes (Signed)
ABG    Component Value Date/Time   PHART 7.393 05/12/2014 0425   PCO2ART 34.2* 05/12/2014 0425   PO2ART 116.0* 05/12/2014 0425   HCO3 20.4 05/12/2014 0425   TCO2 21.4 05/12/2014 0425   ACIDBASEDEF 3.7* 05/12/2014 0425   O2SAT 98.2 05/12/2014 0425   40%, RR 22, VT 500, +5

## 2014-05-12 NOTE — Progress Notes (Addendum)
75cc Fentanyl 40cc Versed wasted in sink.  Witnessed by two RN's.  Scott D. Crofts, RN Romilda Joy, RN

## 2014-05-12 NOTE — Progress Notes (Signed)
ANTICOAGULATION CONSULT NOTE - Follow Up Consult  Pharmacy Consult for heparin Indication: chest pain/ACS  No Known Allergies  Patient Measurements: Height: 5\' 10"  (177.8 cm) Weight: 216 lb 14.9 oz (98.4 kg) IBW/kg (Calculated) : 73 Heparin Dosing Weight: 93 kg  Vital Signs: Temp: 98.5 F (36.9 C) (09/24 0800) Temp src: Oral (09/24 0805) BP: 115/42 mmHg (09/24 1000) Pulse Rate: 46 (09/24 1000)  Labs:  Recent Labs  05/10/14 0229 05/10/14 2011 05/11/14 0630 05/11/14 1623 05/11/14 2359 05/12/14 0415 05/12/14 0851 05/12/14 1030  HGB 12.4*  --  13.4  --   --  13.6  --   --   HCT 37.8*  --  41.1  --   --  40.4  --   --   PLT 207  --  204  --   --  286  --   --   LABPROT 22.8*  --  18.2*  --   --   --   --  17.0*  INR 2.01*  --  1.51*  --   --   --   --  1.38  HEPARINUNFRC  --  0.13* 0.43  --   --   --   --  0.42  CREATININE 1.05  --   --  1.39* 1.58*  --  1.71*  --     Estimated Creatinine Clearance: 45.3 ml/min (by C-G formula based on Cr of 1.71).   Medications:  Scheduled:  . allopurinol  300 mg Oral QHS  . ampicillin-sulbactam (UNASYN) IV  3 g Intravenous Q8H  . antiseptic oral rinse  7 mL Mouth Rinse QID  . aspirin  81 mg Oral QHS  . atorvastatin  80 mg Oral q1800  . chlorhexidine  15 mL Mouth Rinse BID  . fenofibrate  160 mg Oral Daily  . fentaNYL  50 mcg Intravenous Once  . furosemide  40 mg Intravenous Q12H  . insulin aspart  0-9 Units Subcutaneous 6 times per day  . ipratropium  0.5 mg Nebulization TID PC  . levalbuterol  0.63 mg Nebulization Q6H  . metoprolol  5 mg Intravenous 4 times per day  . omega-3 acid ethyl esters  1 g Oral BID  . pantoprazole (PROTONIX) IV  40 mg Intravenous Q24H  . povidone-iodine   Topical Q1200  . pregabalin  50 mg Oral QHS    Assessment: 73 yo m admitted on 9/19 for chest pain and dyspnea x 2-3 days. Patient has had CABGs (x2), PCI, and aortic valve replacement w/ hx of PVD. Was on warfarin 7.5 mg on Monday and Friday  then 5 mg on all other days of the week PTA. HL this AM was therapeutic at 0.42 on 1500 units/hr. Patient is now s/p cath and will eventually need PCI. Patient developed bradycardia and respiratory failure during cath requiring intubation. Plans to try extubation later today. No intervention was done d/t patient's respiratory decline. Heparin was restarted 8-hrs post shealth removal on 9/23 at 2330. Hgb 13.6, plts 286, no bleeding noted.   Goal of Therapy:  Heparin level 0.3-0.7 units/ml Monitor platelets by anticoagulation protocol: Yes   Plan:  Continue heparin infusion at 1500 units/hr Check 8-hr HL at 1830 Monitor carefully for s/s of bleeding, hgb/plts, clinical course  Roxy Horseman, PharmD Candidate 05/12/2014,12:02 PM  I have reviewed the note and agree with the plan above.   Cassie L. Nicole Kindred, PharmD Clinical Pharmacy Resident Pager: [phone] 05/12/2014 12:29 PM

## 2014-05-12 NOTE — Progress Notes (Signed)
Unsuccessful attempt to place a coude catheter by coude nurse from [employer]. Patient has been incontinent prior to this attempt. Condom cath placed again.

## 2014-05-12 NOTE — Progress Notes (Signed)
PROGRESS NOTE  Subjective:   73 yo with hx of CAD , AVR  Admitted in transfer from [facility] with symptoms of unstable angina, Similar to his previous episodes of angina. The episode was preceded on one occasion by a greasy chicken dinner. He states that symptoms were very somewhat to his previous episodes of angina. He did not try nitroglycerin because he had run out.  He had a cath yesterday that revealed a severely diseased SVG to RCA.  He had respiratory failure during the cath requiring intubation .  Objective:    Vital Signs:   Temp:  [97.9 F (36.6 C)-99 F (37.2 C)] 98.5 F (36.9 C) (09/24 0800) Pulse Rate:  [28-123] 49 (09/24 0805) Resp:  [17-22] 22 (09/24 0805) BP: (113-143)/(36-68) 143/38 mmHg (09/24 0805) SpO2:  [94 %-100 %] 100 % (09/24 0805) Arterial Line BP: (84-185)/(31-73) 136/49 mmHg (09/24 0800) FiO2 (%):  [40 %-60 %] 40 % (09/24 0805) Weight:  [216 lb 14.9 oz (98.4 kg)] 216 lb 14.9 oz (98.4 kg) (09/24 0400)  Last BM Date: 05/09/14   24-hour weight change: Weight change: 2 lb 10.3 oz (1.2 kg)  Weight trends: Filed Weights   05/09/14 1330 05/11/14 0600 05/12/14 0400  Weight: 212 lb 4.9 oz (96.3 kg) 214 lb 4.6 oz (97.2 kg) 216 lb 14.9 oz (98.4 kg)    Intake/Output:  09/23 0701 - 09/24 0700 In: 1967.2 [P.O.:120; I.V.:1647.2; IV Piggyback:200] Out: 525 [Urine:525]     Physical Exam: BP 143/38  Pulse 49  Temp(Src) 98.5 F (36.9 C) (Oral)  Resp 22  Ht 5\' 10"  (1.778 m)  Wt 216 lb 14.9 oz (98.4 kg)  BMI 31.13 kg/m2  SpO2 100%  Wt Readings from Last 3 Encounters:  05/12/14 216 lb 14.9 oz (98.4 kg)  05/12/14 216 lb 14.9 oz (98.4 kg)  01/19/14 212 lb (96.163 kg)    General: Vital signs reviewed and noted. Intubated, sedated   Head: Normocephalic, atraumatic.  Eyes: conjunctivae/corneas clear.  EOM's intact.   Throat: normal  Neck:  normal   Lungs:    clear  Heart:  Irreg. Mechanical S2  Abdomen:  Soft, non-tender, non-distended      Extremities: Trace edema on right, 1+ on left.  .  His left great toe . 2nd and 3rd toes appear to be ischemic.    Neurologic: Sedated   Psych: Sedated     Labs: BMET:  Recent Labs  05/11/14 1623 05/11/14 2359  NA 139 139  K 4.6 5.0  CL 102 102  CO2 25 21  GLUCOSE 222* 216*  BUN 31* 35*  CREATININE 1.39* 1.58*  CALCIUM 9.1 9.1  MG  --  1.8    Liver function tests: No results found for this basename: AST, ALT, ALKPHOS, BILITOT, PROT, ALBUMIN,  in the last 72 hours No results found for this basename: LIPASE, AMYLASE,  in the last 72 hours  CBC:  Recent Labs  05/11/14 0630 05/12/14 0415  WBC 7.3 16.3*  HGB 13.4 13.6  HCT 41.1 40.4  MCV 93.6 89.0  PLT 204 286    Cardiac Enzymes: No results found for this basename: CKTOTAL, CKMB, TROPONINI,  in the last 72 hours  Coagulation Studies:  Recent Labs  05/10/14 0229 05/11/14 0630  LABPROT 22.8* 18.2*  INR 2.01* 1.51*      Other results:  EKG :  NSR, LBBB Telemetry: Normal sinus rhythm. He is frequent premature ventricular contractions.  Medications:    Infusions: .  sodium chloride 10 mL/hr at 05/11/14 1800  . amiodarone 30 mg/hr (05/12/14 0351)  . fentaNYL infusion INTRAVENOUS 50 mcg/hr (05/11/14 1841)  . heparin 1,500 Units/hr (05/11/14 2340)  . midazolam (VERSED) infusion 2 mg/hr (05/12/14 0542)  . norepinephrine (LEVOPHED) Adult infusion 17 mcg/min (05/12/14 0617)    Scheduled Medications: . allopurinol  300 mg Oral QHS  . amiodarone      . amLODipine  5 mg Oral q morning - 10a  . ampicillin-sulbactam (UNASYN) IV  3 g Intravenous Q8H  . antiseptic oral rinse  7 mL Mouth Rinse QID  . aspirin  81 mg Oral QHS  . atorvastatin  80 mg Oral q1800  . chlorhexidine  15 mL Mouth Rinse BID  . fenofibrate  160 mg Oral Daily  . fentaNYL  50 mcg Intravenous Once  . furosemide  40 mg Intravenous Q12H  . insulin aspart  0-9 Units Subcutaneous 6 times per day  . ipratropium  0.5 mg Nebulization TID PC  .  isosorbide mononitrate  45 mg Oral Daily  . levalbuterol  0.63 mg Nebulization Q6H  . lisinopril  2.5 mg Oral Daily  . metoprolol  5 mg Intravenous 4 times per day  . omega-3 acid ethyl esters  1 g Oral BID  . pantoprazole (PROTONIX) IV  40 mg Intravenous Q24H  . potassium chloride SA  20 mEq Oral Daily  . povidone-iodine   Topical Q1200  . pregabalin  50 mg Oral QHS    Assessment/ Plan:   Principal Problem:   Unstable angina Active Problems:   Long term (current) use of anticoagulants   H/O aortic valve replacement- [employer]   CAD - CABG '90 with re do '94    Tobacco abuse   PVD prior SFA PTA with chronic LE disease, not amenable to PTA   DM2 (diabetes mellitus, type 2)   Carotid stenosis   Hyperlipidemia with target LDL less than 70   Chest pain on admission MI r/o- Myoview negative 08/13/13   Cardiomyopathy, ischemic - EF 25-30% by echo 5/14   COPD (chronic obstructive pulmonary disease)   LBBB (left bundle branch block)   Warfarin-induced coagulopathy   HTN (hypertension)   PVC's (premature ventricular contractions)  1. Unstable angina: The patient has a history of coronary artery disease.  He has a severely and diffusely diseased SVG to RCA.  He had a respiratory arrest during the cath  and PCI was not performed.   2. Aortic stenosis: Patient has a mechanical aortic valve. It sounds nice and crisp.  On heparin drip    3. Hyperlipidemia:  He is on atorvastatin 80 mg a day  4. Diabetes mellitus: Continue with his current diabetic medications  5. Bradycardia :  He is metoprolol 75 mg a day. We'll reduce his dose to 50 mg a day.  THS is ok.   6. Peripheral vascular disease:  He has severe PVD and has been followed by Dr. [name]. His left foot appears to be ischemic. Dr Kellie Simmering saw him and determined that he is stable from a PVD standpoint.  7. Hypotension:  Requiring levophed.  Will hold lisinopril ( especially given that he creatinine is rising slowly)  , hold amlodipine  and Imdur   8. PVCs :  Was started on amiodarone.   I see no notes to explain why he was started on amio except for PVCs.  He has severe lung disease and long term amiodarone would probably not be tolerated very well  .  Will DC .   9. Respiratory arrest:  ? Of hypertensive,  He became bradycardic after a coughing episode .  Temporary wire was placed. He was intubated.   There is some concern that he aspirated.  He is on Abx.  Further plans per PCCM.   Disposition:  Length of Stay: 5  Thayer Headings, Brooke Bonito., MD, [facility] 05/12/2014, 8:25 AM Office [phone] Pager [phone]

## 2014-05-12 NOTE — Progress Notes (Signed)
18FR coude catheter inserted by urologist due to inability of multiple RN's being able to insert regular catheter and failure by 6N RNs as well.  Bladder scans completes with urine in bladder.  Pt also having urinary retention, unable to void with condom cath.  Pt on ventilator and sedated.

## 2014-05-12 NOTE — Progress Notes (Signed)
ANTICOAGULATION CONSULT NOTE - Follow Up Consult  Pharmacy Consult for heparin Indication: chest pain/ACS  No Known Allergies  Patient Measurements: Height: 5\' 10"  (177.8 cm) Weight: 216 lb 14.9 oz (98.4 kg) IBW/kg (Calculated) : 73 Heparin Dosing Weight: 93 kg  Vital Signs: Temp: 98.5 F (36.9 C) (09/24 1200) Temp src: Oral (09/24 1200) BP: 143/47 mmHg (09/24 1617) Pulse Rate: 73 (09/24 1617)  Labs:  Recent Labs  05/10/14 0229 05/10/14 2011 05/11/14 0630 05/11/14 1623 05/11/14 2359 05/12/14 0415 05/12/14 0851 05/12/14 1030  HGB 12.4*  --  13.4  --   --  13.6  --   --   HCT 37.8*  --  41.1  --   --  40.4  --   --   PLT 207  --  204  --   --  286  --   --   LABPROT 22.8*  --  18.2*  --   --   --   --  17.0*  INR 2.01*  --  1.51*  --   --   --   --  1.38  HEPARINUNFRC  --  0.13* 0.43  --   --   --   --  0.42  CREATININE 1.05  --   --  1.39* 1.58*  --  1.71*  --     Estimated Creatinine Clearance: 45.3 ml/min (by C-G formula based on Cr of 1.71).   Medications:  Scheduled:  . allopurinol  300 mg Oral QHS  . ampicillin-sulbactam (UNASYN) IV  3 g Intravenous Q8H  . antiseptic oral rinse  7 mL Mouth Rinse QID  . aspirin  81 mg Oral QHS  . atorvastatin  80 mg Oral q1800  . chlorhexidine  15 mL Mouth Rinse BID  . fenofibrate  160 mg Oral Daily  . fentaNYL  50 mcg Intravenous Once  . furosemide  40 mg Intravenous Q12H  . insulin aspart  0-9 Units Subcutaneous 6 times per day  . ipratropium  0.5 mg Nebulization TID PC  . levalbuterol  0.63 mg Nebulization Q6H  . metoprolol  5 mg Intravenous 4 times per day  . omega-3 acid ethyl esters  1 g Oral BID  . pantoprazole (PROTONIX) IV  40 mg Intravenous Q24H  . povidone-iodine   Topical Q1200  . pregabalin  50 mg Oral QHS   Infusions:  . sodium chloride Stopped (05/12/14 0930)  . fentaNYL infusion INTRAVENOUS Stopped (05/12/14 1356)  . heparin 1,500 Units/hr (05/12/14 1513)  . midazolam (VERSED) infusion Stopped  (05/12/14 1356)  . norepinephrine (LEVOPHED) Adult infusion 2 mcg/min (05/12/14 1550)    Assessment: 73 yo male with ACS is currently on therapeutic heparin.  Heparin level is 0.42. Goal of Therapy:  Heparin level 0.3-0.7 units/ml Monitor platelets by anticoagulation protocol: Yes   Plan:  - continue heparin at 1500 units/hr - heparin level and CBC in am  [place]ana Walthall, Tsz-Yin 05/12/2014,5:01 PM

## 2014-05-13 ENCOUNTER — Encounter (HOSPITAL_COMMUNITY)
Admission: EM | Disposition: A | Discharge status: Home / Self Care | Payer: Self-pay | Source: Home / Self Care | Attending: Cardiology

## 2014-05-13 DIAGNOSIS — E119 Type 2 diabetes mellitus without complications: Secondary | ICD-10-CM

## 2014-05-13 DIAGNOSIS — R918 Other nonspecific abnormal finding of lung field: Secondary | ICD-10-CM

## 2014-05-13 DIAGNOSIS — I25810 Atherosclerosis of coronary artery bypass graft(s) without angina pectoris: Secondary | ICD-10-CM

## 2014-05-13 DIAGNOSIS — I959 Hypotension, unspecified: Secondary | ICD-10-CM

## 2014-05-13 HISTORY — PX: PERCUTANEOUS CORONARY STENT INTERVENTION (PCI-S): SHX5485

## 2014-05-13 LAB — GLUCOSE, CAPILLARY
GLUCOSE-CAPILLARY: 145 mg/dL — AB (ref 70–99)
GLUCOSE-CAPILLARY: 125 mg/dL — AB (ref 70–99)
Glucose-Capillary: 137 mg/dL — ABNORMAL HIGH (ref 70–99)
Glucose-Capillary: 130 mg/dL — ABNORMAL HIGH (ref 70–99)
Glucose-Capillary: 112 mg/dL — ABNORMAL HIGH (ref 70–99)
Glucose-Capillary: 141 mg/dL — ABNORMAL HIGH (ref 70–99)

## 2014-05-13 LAB — POCT ACTIVATED CLOTTING TIME: Activated Clotting Time: 405 seconds

## 2014-05-13 LAB — HEPARIN LEVEL (UNFRACTIONATED): HEPARIN UNFRACTIONATED: 0.75 [IU]/mL — AB (ref 0.30–0.70)

## 2014-05-13 LAB — BASIC METABOLIC PANEL
ANION GAP: 15 (ref 5–15)
BUN: 31 mg/dL — ABNORMAL HIGH (ref 6–23)
CALCIUM: 9.1 mg/dL (ref 8.4–10.5)
CO2: 26 mEq/L (ref 19–32)
CREATININE: 1.28 mg/dL (ref 0.50–1.35)
Chloride: 101 mEq/L (ref 96–112)
GFR calc non Af Amer: 54 mL/min — ABNORMAL LOW (ref >90)
GFR, EST AFRICAN AMERICAN: 62 mL/min — AB (ref >90)
Glucose, Bld: 142 mg/dL — ABNORMAL HIGH (ref 70–99)
Potassium: 3.8 mEq/L (ref 3.7–5.3)
SODIUM: 142 meq/L (ref 137–147)

## 2014-05-13 LAB — CBC
HEMATOCRIT: 35 % — AB (ref 39.0–52.0)
HEMOGLOBIN: 11.7 g/dL — AB (ref 13.0–17.0)
MCH: 30 pg (ref 26.0–34.0)
MCHC: 33.4 g/dL (ref 30.0–36.0)
MCV: 89.7 fL (ref 78.0–100.0)
PLATELETS: 187 10*3/uL (ref 150–400)
RBC: 3.9 MIL/uL — ABNORMAL LOW (ref 4.22–5.81)
RDW: 14.7 % (ref 11.5–15.5)
WBC: 11.3 10*3/uL — AB (ref 4.0–10.5)

## 2014-05-13 LAB — PROTIME-INR
INR: 1.52 — ABNORMAL HIGH (ref 0.00–1.49)
Prothrombin Time: 18.3 seconds — ABNORMAL HIGH (ref 11.6–15.2)

## 2014-05-13 SURGERY — PERCUTANEOUS CORONARY STENT INTERVENTION (PCI-S)
Anesthesia: LOCAL

## 2014-05-13 MED ORDER — VERAPAMIL HCL 2.5 MG/ML IV SOLN
INTRAVENOUS | Status: AC
  Filled 2014-05-13: qty 2

## 2014-05-13 MED ORDER — SODIUM CHLORIDE 0.9 % IV SOLN: INTRAVENOUS | Status: DC

## 2014-05-13 MED ORDER — ASPIRIN 81 MG PO CHEW: 81.0000 mg | CHEWABLE_TABLET | ORAL | Status: DC

## 2014-05-13 MED ORDER — CLOPIDOGREL BISULFATE 75 MG PO TABS
75.0000 mg | ORAL_TABLET | Freq: Every day | ORAL | Status: DC
  Administered 2014-05-14 – 2014-05-17 (×4): 75 mg via ORAL
  Filled 2014-05-13 (×5): qty 1

## 2014-05-13 MED ORDER — CLOPIDOGREL BISULFATE 300 MG PO TABS
ORAL_TABLET | ORAL | Status: AC
  Filled 2014-05-13: qty 1

## 2014-05-13 MED ORDER — SODIUM CHLORIDE 0.9 % IV SOLN
INTRAVENOUS | Status: AC
  Administered 2014-05-13: 18:00:00 via INTRAVENOUS

## 2014-05-13 MED ORDER — NITROGLYCERIN 1 MG/10 ML FOR IR/CATH LAB
INTRA_ARTERIAL | Status: AC
  Filled 2014-05-13: qty 10

## 2014-05-13 MED ORDER — INSULIN ASPART 100 UNIT/ML ~~LOC~~ SOLN
0.0000 [IU] | Freq: Three times a day (TID) | SUBCUTANEOUS | Status: DC
  Administered 2014-05-14 (×2): 2 [IU] via SUBCUTANEOUS
  Administered 2014-05-14: 3 [IU] via SUBCUTANEOUS
  Administered 2014-05-15: 5 [IU] via SUBCUTANEOUS
  Administered 2014-05-15: 3 [IU] via SUBCUTANEOUS
  Administered 2014-05-15: 2 [IU] via SUBCUTANEOUS
  Administered 2014-05-16: 3 [IU] via SUBCUTANEOUS
  Administered 2014-05-16: 2 [IU] via SUBCUTANEOUS
  Administered 2014-05-16 – 2014-05-17 (×2): 3 [IU] via SUBCUTANEOUS
  Administered 2014-05-17: 2 [IU] via SUBCUTANEOUS

## 2014-05-13 MED ORDER — SODIUM CHLORIDE 0.9 % IV SOLN
0.2500 mg/kg/h | INTRAVENOUS | Status: DC
  Filled 2014-05-13: qty 250

## 2014-05-13 MED ORDER — ASPIRIN 81 MG PO CHEW
81.0000 mg | CHEWABLE_TABLET | Freq: Every day | ORAL | Status: DC
  Administered 2014-05-13: 81 mg via ORAL
  Filled 2014-05-13: qty 1

## 2014-05-13 MED ORDER — SODIUM CHLORIDE 0.9 % IJ SOLN: 3.0000 mL | INTRAMUSCULAR | Status: DC | PRN

## 2014-05-13 MED ORDER — OXYCODONE-ACETAMINOPHEN 5-325 MG PO TABS
2.0000 | ORAL_TABLET | Freq: Four times a day (QID) | ORAL | Status: DC | PRN
  Administered 2014-05-13 – 2014-05-17 (×11): 2 via ORAL
  Filled 2014-05-13 (×11): qty 2

## 2014-05-13 MED ORDER — MIDAZOLAM HCL 2 MG/2ML IJ SOLN
INTRAMUSCULAR | Status: AC
  Filled 2014-05-13: qty 2

## 2014-05-13 MED ORDER — HEPARIN (PORCINE) IN NACL 2-0.9 UNIT/ML-% IJ SOLN
INTRAMUSCULAR | Status: AC
  Filled 2014-05-13: qty 1000

## 2014-05-13 MED ORDER — WARFARIN - PHARMACIST DOSING INPATIENT
Freq: Every day | Status: DC
Start: 2014-05-13 — End: 2014-05-17
  Administered 2014-05-13 – 2014-05-14 (×2)

## 2014-05-13 MED ORDER — SODIUM CHLORIDE 0.9 % IJ SOLN
3.0000 mL | Freq: Two times a day (BID) | INTRAMUSCULAR | Status: DC
  Administered 2014-05-13 – 2014-05-15 (×3): 3 mL via INTRAVENOUS

## 2014-05-13 MED ORDER — ASPIRIN 81 MG PO CHEW
81.0000 mg | CHEWABLE_TABLET | Freq: Every day | ORAL | Status: DC
  Administered 2014-05-14 – 2014-05-16 (×3): 81 mg via ORAL
  Filled 2014-05-13 (×3): qty 1

## 2014-05-13 MED ORDER — ONDANSETRON HCL 4 MG/2ML IJ SOLN: 4.0000 mg | Freq: Four times a day (QID) | INTRAMUSCULAR | Status: DC | PRN

## 2014-05-13 MED ORDER — WARFARIN SODIUM 7.5 MG PO TABS
7.5000 mg | ORAL_TABLET | Freq: Once | ORAL | Status: AC
  Administered 2014-05-13: 7.5 mg via ORAL
  Filled 2014-05-13: qty 1

## 2014-05-13 MED ORDER — FENTANYL CITRATE 0.05 MG/ML IJ SOLN
INTRAMUSCULAR | Status: AC
  Filled 2014-05-13: qty 2

## 2014-05-13 MED ORDER — SODIUM CHLORIDE 0.9 % IV SOLN: 250.0000 mL | INTRAVENOUS | Status: DC | PRN

## 2014-05-13 MED ORDER — ACETAMINOPHEN 325 MG PO TABS
650.0000 mg | ORAL_TABLET | ORAL | Status: DC | PRN
  Administered 2014-05-16 – 2014-05-17 (×2): 650 mg via ORAL
  Filled 2014-05-13 (×2): qty 2

## 2014-05-13 MED ORDER — HEPARIN (PORCINE) IN NACL 100-0.45 UNIT/ML-% IJ SOLN
1650.0000 [IU]/h | INTRAMUSCULAR | Status: DC
  Administered 2014-05-13 – 2014-05-15 (×3): 1400 [IU]/h via INTRAVENOUS
  Administered 2014-05-16 (×2): 1650 [IU]/h via INTRAVENOUS
  Filled 2014-05-13 (×7): qty 250

## 2014-05-13 MED ORDER — BIVALIRUDIN 250 MG IV SOLR
INTRAVENOUS | Status: AC
  Filled 2014-05-13: qty 250

## 2014-05-13 MED ORDER — HEPARIN (PORCINE) IN NACL 100-0.45 UNIT/ML-% IJ SOLN: 1400.0000 [IU]/h | INTRAMUSCULAR | Status: DC

## 2014-05-13 MED ORDER — LIDOCAINE HCL (PF) 1 % IJ SOLN
INTRAMUSCULAR | Status: AC
  Filled 2014-05-13: qty 30

## 2014-05-13 NOTE — Interval H&P Note (Signed)
Cath Lab Visit (complete for each Cath Lab visit)  Clinical Evaluation Leading to the Procedure:   ACS: Yes.    Non-ACS:    Anginal Classification: CCS IV  Anti-ischemic medical therapy: Minimal Therapy (1 class of medications)  Non-Invasive Test Results: No non-invasive testing performed  Prior CABG: Previous CABG      History and Physical Interval Note:  05/13/2014 12:45 PM  Tanner Pena  has presented today for surgery, with the diagnosis of CAD  The various methods of treatment have been discussed with the patient and family. After consideration of risks, benefits and other options for treatment, the patient has consented to  Procedure(s): PERCUTANEOUS CORONARY STENT INTERVENTION (PCI-S) (N/A) as a surgical intervention .  The patient's history has been reviewed, patient examined, no change in status, stable for surgery.  I have reviewed the patient's chart and labs.  Questions were answered to the patient's satisfaction.     VARANASI,JAYADEEP S.

## 2014-05-13 NOTE — Progress Notes (Signed)
ANTICOAGULATION CONSULT NOTE - Follow Up Consult  Pharmacy Consult for heparin Indication: USAP  Labs:  Recent Labs  05/11/14 0630 05/11/14 1623 05/11/14 2359 05/12/14 0415 05/12/14 0851 05/12/14 1030 05/12/14 1830 05/13/14 0420  HGB 13.4  --   --  13.6  --   --   --  11.7*  HCT 41.1  --   --  40.4  --   --   --  35.0*  PLT 204  --   --  286  --   --   --  187  LABPROT 18.2*  --   --   --   --  17.0*  --  18.3*  INR 1.51*  --   --   --   --  1.38  --  1.52*  HEPARINUNFRC 0.43  --   --   --   --  0.42 0.42 0.75*  CREATININE  --  1.39* 1.58*  --  1.71*  --   --   --     Assessment: 73yo male now slightly supratherapeutic on heparin after three levels at goal; drawn by RN via art line.  Goal of Therapy:  Heparin level 0.3-0.7 units/ml   Plan:  Will decrease heparin gtt by 1 unit/kg/hr to 1400 units/hr and check level in [place], PharmD, BCPS  05/13/2014,5:14 AM

## 2014-05-13 NOTE — Progress Notes (Signed)
PROGRESS NOTE  Subjective:   73 yo with hx of CAD , AVR  Admitted in transfer from [facility] with symptoms of unstable angina, Similar to his previous episodes of angina. The episode was preceded on one occasion by a greasy chicken dinner. He states that symptoms were very somewhat to his previous episodes of angina. He did not try nitroglycerin because he had run out.  He had a cath yesterday that revealed a severely diseased SVG to RCA.  He had respiratory failure during the cath requiring intubation.  He has done well.  Is breathing better . Has been extubated.   Creatinine is up a bit.   Objective:    Vital Signs:   Temp:  [98.5 F (36.9 C)-99.9 F (37.7 C)] 98.6 F (37 C) (09/25 0300) Pulse Rate:  [30-81] 68 (09/25 0500) Resp:  [14-22] 16 (09/25 0500) BP: (97-152)/(35-53) 129/36 mmHg (09/25 0400) SpO2:  [96 %-100 %] 100 % (09/25 0500) Arterial Line BP: (105-171)/(34-64) 134/42 mmHg (09/25 0500) FiO2 (%):  [40 %] 40 % (09/24 1617) Weight:  [212 lb 8.4 oz (96.4 kg)] 212 lb 8.4 oz (96.4 kg) (09/25 0300)  Last BM Date: 05/09/14   24-hour weight change: Weight change: -4 lb 6.5 oz (-2 kg)  Weight trends: Filed Weights   05/11/14 0600 05/12/14 0400 05/13/14 0300  Weight: 214 lb 4.6 oz (97.2 kg) 216 lb 14.9 oz (98.4 kg) 212 lb 8.4 oz (96.4 kg)    Intake/Output:  09/24 0701 - 09/25 0700 In: 841.9 [I.V.:541.9; IV Piggyback:300] Out: 2725 [Urine:2725]     Physical Exam: BP 129/36  Pulse 68  Temp(Src) 98.6 F (37 C) (Oral)  Resp 16  Ht 5\' 10"  (1.778 m)  Wt 212 lb 8.4 oz (96.4 kg)  BMI 30.49 kg/m2  SpO2 100%  Wt Readings from Last 3 Encounters:  05/13/14 212 lb 8.4 oz (96.4 kg)  05/13/14 212 lb 8.4 oz (96.4 kg)  01/19/14 212 lb (96.163 kg)    General: Vital signs reviewed and noted. Intubated, sedated   Head: Normocephalic, atraumatic.  Eyes: conjunctivae/corneas clear.  EOM's intact.   Throat: normal  Neck:  normal   Lungs:    clear  Heart:  Irreg.  Mechanical S2  Abdomen:  Soft, non-tender, non-distended    Extremities: Trace edema on right, 1+ on left.  .  His left great toe . 2nd and 3rd toes appear to be ischemic.    Neurologic: Sedated   Psych: Sedated     Labs: BMET:  Recent Labs  05/11/14 2359 05/12/14 0851  NA 139 137  K 5.0 4.6  CL 102 102  CO2 21 21  GLUCOSE 216* 188*  BUN 35* 37*  CREATININE 1.58* 1.71*  CALCIUM 9.1 9.1  MG 1.8  --     Liver function tests: No results found for this basename: AST, ALT, ALKPHOS, BILITOT, PROT, ALBUMIN,  in the last 72 hours No results found for this basename: LIPASE, AMYLASE,  in the last 72 hours  CBC:  Recent Labs  05/12/14 0415 05/13/14 0420  WBC 16.3* 11.3*  HGB 13.6 11.7*  HCT 40.4 35.0*  MCV 89.0 89.7  PLT 286 187    Cardiac Enzymes: No results found for this basename: CKTOTAL, CKMB, TROPONINI,  in the last 72 hours  Coagulation Studies:  Recent Labs  05/11/14 0630 05/12/14 1030 05/13/14 0420  LABPROT 18.2* 17.0* 18.3*  INR 1.51* 1.38 1.52*      Other results:  EKG :  NSR, LBBB Telemetry: Normal sinus rhythm.    Medications:    Infusions: . sodium chloride Stopped (05/12/14 0930)  . heparin 1,400 Units/hr (05/13/14 0516)  . norepinephrine (LEVOPHED) Adult infusion Stopped (05/12/14 1800)    Scheduled Medications: . allopurinol  300 mg Oral QHS  . ampicillin-sulbactam (UNASYN) IV  3 g Intravenous Q8H  . antiseptic oral rinse  7 mL Mouth Rinse QID  . aspirin  81 mg Oral QHS  . atorvastatin  80 mg Oral q1800  . fenofibrate  160 mg Oral Daily  . furosemide  40 mg Intravenous Q12H  . insulin aspart  0-9 Units Subcutaneous 6 times per day  . ipratropium  0.5 mg Nebulization TID PC  . levalbuterol  0.63 mg Nebulization Q6H  . metoprolol  5 mg Intravenous 4 times per day  . omega-3 acid ethyl esters  1 g Oral BID  . pantoprazole (PROTONIX) IV  40 mg Intravenous Q24H  . povidone-iodine   Topical Q1200  . pregabalin  50 mg Oral QHS     Assessment/ Plan:   Principal Problem:   Unstable angina Active Problems:   Long term (current) use of anticoagulants   H/O aortic valve replacement- [employer]   CAD - CABG '90 with re do '94    Tobacco abuse   PVD prior SFA PTA with chronic LE disease, not amenable to PTA   DM2 (diabetes mellitus, type 2)   Carotid stenosis   Hyperlipidemia with target LDL less than 70   Chest pain on admission MI r/o- Myoview negative 08/13/13   Cardiomyopathy, ischemic - EF 25-30% by echo 5/14   COPD (chronic obstructive pulmonary disease)   LBBB (left bundle branch block)   Warfarin-induced coagulopathy   HTN (hypertension)   PVC's (premature ventricular contractions)  1. Unstable angina: The patient has a history of coronary artery disease.  He has a severely and diffusely diseased SVG to RCA.  He had a respiratory arrest during the cath  and PCI was not performed.  Today he is much better. Extubated.  Breathing well.  Creatinine is a bit high.  Will give some IV hydration and recheck BMP at 11 AM. Will see if it is OK for PCI this afternoon.   2. Aortic stenosis: Patient has a mechanical aortic valve. It sounds nice and crisp.  On heparin drip    3. Hyperlipidemia:  He is on atorvastatin 80 mg a day  4. Diabetes mellitus: Continue with his current diabetic medications  5. Bradycardia :  He is metoprolol 75 mg a day. We'll reduce his dose to 50 mg a day.  THS is ok.   6. Peripheral vascular disease:  He has severe PVD and has been followed by Dr. [name]. His left foot appears to be ischemic. Dr Kellie Simmering saw him and determined that he is stable from a PVD standpoint.  7. Hypotension:  He is now off pressers.  We have held his BP meds. Would restart Troprol XL 25 mg tomorrow if he is doing OK and can tolerate it.  8. PVCs :  Better.  We have stopped amio    9. Respiratory arrest:  ? Of hypertensive,  He became bradycardic after a coughing episode .  Had a temp pacer placed - has been  removed.  Disposition:  For possible PCI this afternoon if creatinine is ok Length of Stay: 6  Thayer Headings, Brooke Bonito., MD, [facility] 05/13/2014, 8:45 AM Office [phone] Pager [phone]

## 2014-05-13 NOTE — H&P (View-Only) (Signed)
PROGRESS NOTE  Subjective:   73 yo with hx of CAD , AVR  Admitted in transfer from [facility] with symptoms of unstable angina, Similar to his previous episodes of angina. The episode was preceded on one occasion by a greasy chicken dinner. He states that symptoms were very somewhat to his previous episodes of angina. He did not try nitroglycerin because he had run out.  He had a cath yesterday that revealed a severely diseased SVG to RCA.  He had respiratory failure during the cath requiring intubation.  He has done well.  Is breathing better . Has been extubated.   Creatinine is up a bit.   Objective:    Vital Signs:   Temp:  [98.5 F (36.9 C)-99.9 F (37.7 C)] 98.6 F (37 C) (09/25 0300) Pulse Rate:  [30-81] 68 (09/25 0500) Resp:  [14-22] 16 (09/25 0500) BP: (97-152)/(35-53) 129/36 mmHg (09/25 0400) SpO2:  [96 %-100 %] 100 % (09/25 0500) Arterial Line BP: (105-171)/(34-64) 134/42 mmHg (09/25 0500) FiO2 (%):  [40 %] 40 % (09/24 1617) Weight:  [212 lb 8.4 oz (96.4 kg)] 212 lb 8.4 oz (96.4 kg) (09/25 0300)  Last BM Date: 05/09/14   24-hour weight change: Weight change: -4 lb 6.5 oz (-2 kg)  Weight trends: Filed Weights   05/11/14 0600 05/12/14 0400 05/13/14 0300  Weight: 214 lb 4.6 oz (97.2 kg) 216 lb 14.9 oz (98.4 kg) 212 lb 8.4 oz (96.4 kg)    Intake/Output:  09/24 0701 - 09/25 0700 In: 841.9 [I.V.:541.9; IV Piggyback:300] Out: 2725 [Urine:2725]     Physical Exam: BP 129/36  Pulse 68  Temp(Src) 98.6 F (37 C) (Oral)  Resp 16  Ht 5\' 10"  (1.778 m)  Wt 212 lb 8.4 oz (96.4 kg)  BMI 30.49 kg/m2  SpO2 100%  Wt Readings from Last 3 Encounters:  05/13/14 212 lb 8.4 oz (96.4 kg)  05/13/14 212 lb 8.4 oz (96.4 kg)  01/19/14 212 lb (96.163 kg)    General: Vital signs reviewed and noted. Intubated, sedated   Head: Normocephalic, atraumatic.  Eyes: conjunctivae/corneas clear.  EOM's intact.   Throat: normal  Neck:  normal   Lungs:    clear  Heart:  Irreg.  Mechanical S2  Abdomen:  Soft, non-tender, non-distended    Extremities: Trace edema on right, 1+ on left.  .  His left great toe . 2nd and 3rd toes appear to be ischemic.    Neurologic: Sedated   Psych: Sedated     Labs: BMET:  Recent Labs  05/11/14 2359 05/12/14 0851  NA 139 137  K 5.0 4.6  CL 102 102  CO2 21 21  GLUCOSE 216* 188*  BUN 35* 37*  CREATININE 1.58* 1.71*  CALCIUM 9.1 9.1  MG 1.8  --     Liver function tests: No results found for this basename: AST, ALT, ALKPHOS, BILITOT, PROT, ALBUMIN,  in the last 72 hours No results found for this basename: LIPASE, AMYLASE,  in the last 72 hours  CBC:  Recent Labs  05/12/14 0415 05/13/14 0420  WBC 16.3* 11.3*  HGB 13.6 11.7*  HCT 40.4 35.0*  MCV 89.0 89.7  PLT 286 187    Cardiac Enzymes: No results found for this basename: CKTOTAL, CKMB, TROPONINI,  in the last 72 hours  Coagulation Studies:  Recent Labs  05/11/14 0630 05/12/14 1030 05/13/14 0420  LABPROT 18.2* 17.0* 18.3*  INR 1.51* 1.38 1.52*      Other results:  EKG :  NSR, LBBB Telemetry: Normal sinus rhythm.    Medications:    Infusions: . sodium chloride Stopped (05/12/14 0930)  . heparin 1,400 Units/hr (05/13/14 0516)  . norepinephrine (LEVOPHED) Adult infusion Stopped (05/12/14 1800)    Scheduled Medications: . allopurinol  300 mg Oral QHS  . ampicillin-sulbactam (UNASYN) IV  3 g Intravenous Q8H  . antiseptic oral rinse  7 mL Mouth Rinse QID  . aspirin  81 mg Oral QHS  . atorvastatin  80 mg Oral q1800  . fenofibrate  160 mg Oral Daily  . furosemide  40 mg Intravenous Q12H  . insulin aspart  0-9 Units Subcutaneous 6 times per day  . ipratropium  0.5 mg Nebulization TID PC  . levalbuterol  0.63 mg Nebulization Q6H  . metoprolol  5 mg Intravenous 4 times per day  . omega-3 acid ethyl esters  1 g Oral BID  . pantoprazole (PROTONIX) IV  40 mg Intravenous Q24H  . povidone-iodine   Topical Q1200  . pregabalin  50 mg Oral QHS     Assessment/ Plan:   Principal Problem:   Unstable angina Active Problems:   Long term (current) use of anticoagulants   H/O aortic valve replacement- [employer]   CAD - CABG '90 with re do '94    Tobacco abuse   PVD prior SFA PTA with chronic LE disease, not amenable to PTA   DM2 (diabetes mellitus, type 2)   Carotid stenosis   Hyperlipidemia with target LDL less than 70   Chest pain on admission MI r/o- Myoview negative 08/13/13   Cardiomyopathy, ischemic - EF 25-30% by echo 5/14   COPD (chronic obstructive pulmonary disease)   LBBB (left bundle branch block)   Warfarin-induced coagulopathy   HTN (hypertension)   PVC's (premature ventricular contractions)  1. Unstable angina: The patient has a history of coronary artery disease.  He has a severely and diffusely diseased SVG to RCA.  He had a respiratory arrest during the cath  and PCI was not performed.  Today he is much better. Extubated.  Breathing well.  Creatinine is a bit high.  Will give some IV hydration and recheck BMP at 11 AM. Will see if it is OK for PCI this afternoon.   2. Aortic stenosis: Patient has a mechanical aortic valve. It sounds nice and crisp.  On heparin drip    3. Hyperlipidemia:  He is on atorvastatin 80 mg a day  4. Diabetes mellitus: Continue with his current diabetic medications  5. Bradycardia :  He is metoprolol 75 mg a day. We'll reduce his dose to 50 mg a day.  THS is ok.   6. Peripheral vascular disease:  He has severe PVD and has been followed by Dr. [name]. His left foot appears to be ischemic. Dr Kellie Simmering saw him and determined that he is stable from a PVD standpoint.  7. Hypotension:  He is now off pressers.  We have held his BP meds. Would restart Troprol XL 25 mg tomorrow if he is doing OK and can tolerate it.  8. PVCs :  Better.  We have stopped amio    9. Respiratory arrest:  ? Of hypertensive,  He became bradycardic after a coughing episode .  Had a temp pacer placed - has been  removed.  Disposition:  For possible PCI this afternoon if creatinine is ok Length of Stay: 6  Thayer Headings, Brooke Bonito., MD, [facility] 05/13/2014, 8:45 AM Office [phone] Pager [phone]

## 2014-05-13 NOTE — CV Procedure (Signed)
       PROCEDURE:  PCI SVG with distal embolic protection  INDICATIONS:  Unstable angina  The risks, benefits, and details of the procedure were explained to the patient.  The patient verbalized understanding and wanted to proceed.  Informed written consent was obtained.  PROCEDURE TECHNIQUE:  After Xylocaine anesthesia, right femoral access was attempted but unsuccessful. After topical anesthesia, the left radial arterial line was switched out for a 6 French slender sheath over a wire, after the area was prepped and draped sterilely.   SVG to Right coronary artery angiography was done using an RCB guide catheter.  Initially, a pro-water wire was advanced but could not navigate the severe disease in the proximal vessel. A Fielder XT wire was then placed across the area disease in the proximal vein graft. We attempted to advance a 4 mm spider distal embolic protection device but this would not advance. A 2.0 x 20 balloon was used to predilate the area. After multiple pre-dilatations, the spider distal embolic protection device was successfully advanced to the distal vein graft. A 2.5 balloon was then used to predilate. There is significant debris in the basket. We attempted to advance a 3.0 x 38 stent but would not navigate the proximal disease. We again predilated the very proximal graft with a 2.5. The 3.0 stent would not go even with a pro-water as a buddy wire.  A 2.75 x 15 Alpine drug-eluting stent was then advanced and deployed in the mid vein graft. A 2.75 x 23 was then advanced and deployed overlapping the proximal edge of the other Alpine drug-eluting stent. A 2.75 x 28 Alpine drug-eluting stent was then extended to the vessel ostium and deployed. The entire stented segment was post dilated with a 3.5 noncompliant balloon to high pressure. There was an excellent angiographic result. There not appear to be any significant residual stenosis.   CONTRAST:  Total of 100 cc.  COMPLICATIONS:  None.          ANGIOGRAPHIC DATA:     The SVG to right coronary artery is heavily diseased and full of debris in the proximal portion.  See above for PCI NARRATIVE:   IMPRESSIONS:  1. Successful PCI of the  SVG to right coronary artery.  A 2.75 x 15, 2.75 x 23 and 2.7 x 28 were placed in overlapping fashion from the proximal to mid graft. The entire stented segment was post dilated up to 3.6 mm in diameter. Distal embolic protection was used with successful trapping of debris.   RECOMMENDATION:  We'll start aspirin and Plavix at this point. Once his INR is therapeutic, I would stop the aspirin and leave him on Coumadin and Plavix.  Continue aggressive medical therapy. He needs aggressive secondary prevention.  Followup with Dr. [name].

## 2014-05-13 NOTE — Progress Notes (Addendum)
ANTICOAGULATION CONSULT NOTE - Follow Up Consult  Pharmacy Consult for heparin Indication: chest pain/ACS  No Known Allergies  Patient Measurements: Height: 5\' 10"  (177.8 cm) Weight: 212 lb 8.4 oz (96.4 kg) IBW/kg (Calculated) : 73 Heparin Dosing Weight: 93 kg  Vital Signs: Temp: 97.9 F (36.6 C) (09/25 0800) Temp src: Oral (09/25 0800) BP: 147/49 mmHg (09/25 1600) Pulse Rate: 80 (09/25 1600)  Labs:  Recent Labs  05/11/14 0630  05/11/14 2359 05/12/14 0415 05/12/14 0851 05/12/14 1030 05/12/14 1830 05/13/14 0420 05/13/14 1030  HGB 13.4  --   --  13.6  --   --   --  11.7*  --   HCT 41.1  --   --  40.4  --   --   --  35.0*  --   PLT 204  --   --  286  --   --   --  187  --   LABPROT 18.2*  --   --   --   --  17.0*  --  18.3*  --   INR 1.51*  --   --   --   --  1.38  --  1.52*  --   HEPARINUNFRC 0.43  --   --   --   --  0.42 0.42 0.75*  --   CREATININE  --   < > 1.58*  --  1.71*  --   --   --  1.28  < > = values in this interval not displayed.  Estimated Creatinine Clearance: 59.9 ml/min (by C-G formula based on Cr of 1.28).   Medications:  Scheduled:  . allopurinol  300 mg Oral QHS  . ampicillin-sulbactam (UNASYN) IV  3 g Intravenous Q8H  . antiseptic oral rinse  7 mL Mouth Rinse QID  . aspirin  81 mg Oral Daily  . aspirin  81 mg Oral Daily  . atorvastatin  80 mg Oral q1800  . [START ON 05/14/2014] clopidogrel  75 mg Oral Q breakfast  . fenofibrate  160 mg Oral Daily  . insulin aspart  0-9 Units Subcutaneous 6 times per day  . ipratropium  0.5 mg Nebulization TID PC  . levalbuterol  0.63 mg Nebulization Q6H  . metoprolol  5 mg Intravenous 4 times per day  . omega-3 acid ethyl esters  1 g Oral BID  . pantoprazole (PROTONIX) IV  40 mg Intravenous Q24H  . povidone-iodine   Topical Q1200  . pregabalin  50 mg Oral QHS  . sodium chloride  3 mL Intravenous Q12H   Infusions:  . sodium chloride Stopped (05/12/14 0930)  . sodium chloride    . heparin       Assessment: 73 yo male with unstable angina and history of CAD now s/p cath with PCI of the SVG to right coronary artery with a DES. Heparin to resume 8 hours post sheath removal (sheath removal at 14:55pm.  Heparin infusion was at 1400 units prior to cath.     Goal of Therapy:  Heparin level 0.3-0.7 units/ml Monitor platelets by anticoagulation protocol: Yes   Plan:  -Resume heparin at 1400 units 8 hours post sheath removal (will restart at 11pm) -Heparin level in 8 hours -Daily heparin level and CBC  Hildred Laser, Pharm D 05/13/2014 4:26 PM  Addendum: Per Dr. Irish Lack, ok to resume coumadin tonight. Coumadin home dose: 7.5mg  on Monday and Friday, 5mg  all other days, with INR goal of 2.5-3.5 per coumadin clinic note. No coumadin since admission. INR  1.52 today.   Plan: - Coumadin 7.5mg  po x 1 tonight - Daily PT/INR - D/c heparin when INR > 2.5 - Also per Dr. Hassell Done note, will d/c aspirin and leave him on coumadin and plavix once INR is therapeutic.  Maryanna Shape, PharmD, BCPS  Clinical Pharmacist  Pager: [phone]

## 2014-05-13 NOTE — Progress Notes (Signed)
PULMONARY / CRITICAL CARE MEDICINE   Name: Tanner Pena MRN: [id number] DOB: Aug 21, 1940    ADMISSION DATE:  05/07/2014 CONSULTATION DATE:  05/11/14  REFERRING MD :  Dr. Radford Pax   CHIEF COMPLAINT:  Chest pain  INITIAL PRESENTATION:  73 y/o M admitted with chest pain from [facility].  Tx to [facility] on 9/21.  Underwent cardiac cath on 9/23 with non-critical disease.  Suffered respiratory distress (pulmonary edema) during cath requiring intubation.  PCCM consulted.   STUDIES:  9/23 LHC >> significant RCA disease  SIGNIFICANT EVENTS: 9/19  Admitted through [facility] ED with chest pain 9/21  Transferred to [facility] in preparation for heart cath 9/23  Heart catheterization during which patient experienced acute respiratory distress requiring intubation 9/24 Extubated   HISTORY OF PRESENT ILLNESS:  73 yo M smoker with a PMH of DM, HTN, CAD, PVD (with LE stents), OSA, COPD, s/p aortic valve replacement and CABG x 2 who was admitted through the ED at [facility] 9/19 for chest pain.  His first episode occurred on 9/18 while he was attempting to start his 4 wheeler.  He described substernal burning and pain, but denied dyspnea, diaphoresis, or weakness.  The pain subsided in about one hour without medications.  The second occurrence of chest pain was on 9/19 as he was eating [employer] fried chicken dinner.  Again the pain was substernal burning with the addition of an ache that radiates to his jaw, neck and down the inside of both arms.  He also reported increased dyspnea, but no diaphoresis.  After his second episode, he sought care in the ER.  He was seen by Cardiology, and diagnosed with unstable angina.  Troponin levels were negative and EKG was inconclusive for ischemia due to chronic LBBB.   On 9/21, he was transferred to [facility] due to concern for underlying ischemia and the need for definitive evaluation with heart cath.  On 9/22, he was evaluated by [facility] Cardiologist who recommended catheterization delay due to INR greater than 2.   He was started on heparin for his mechanical aortic valve in order to allow his INR to decrease.  Meanwhile, Vascular surgery was also consulted due to chronic L LE ischemic changes and recommended local wound care and outpatient follow up.  The cardiac catheterization occured 9/23 and during the initial phase of the procedure, the patient experienced acute respiratory distress requiring intubation.  Paralytics, sedatives, and levophed were started in the cath lab while the procedure was completed. No critical lesions were noted.  PCCM was consulted and the patient was transferred back to the ICU.   SUBJECTIVE:  Patient doing well this morning. Extubated on 9/23, going to PCI of RCA today.   VITAL SIGNS: Temp:  [97.9 F (36.6 C)-99.9 F (37.7 C)] 97.9 F (36.6 C) (09/25 0800) Pulse Rate:  [30-81] 75 (09/25 1100) Resp:  [14-22] 20 (09/25 1100) BP: (97-152)/(35-53) 121/52 mmHg (09/25 1000) SpO2:  [96 %-100 %] 98 % (09/25 1100) Arterial Line BP: (105-171)/(34-64) 136/39 mmHg (09/25 1100) FiO2 (%):  [40 %] 40 % (09/24 1617) Weight:  [212 lb 8.4 oz (96.4 kg)] 212 lb 8.4 oz (96.4 kg) (09/25 0300)  HEMODYNAMICS:    VENTILATOR SETTINGS: Vent Mode:  [-] PSV;CPAP FiO2 (%):  [40 %] 40 % Set Rate:  [22 bmp] 22 bmp Vt Set:  [500 mL] 500 mL PEEP:  [5 cmH20] 5 cmH20 Pressure Support:  [5 cmH20] 5 cmH20 Plateau Pressure:  [18 cmH20] 18 cmH20  INTAKE / OUTPUT:  Intake/Output  Summary (Last 24 hours) at 05/13/14 1204 Last data filed at 05/13/14 1100  Gross per 24 hour  Intake 684.86 ml  Output   3925 ml  Net -3240.14 ml    PHYSICAL EXAMINATION: General:  Chronically ill in NAD Neuro:  AAOx4, speech clear, MAE HEENT:  Mm pink/moist, OETT Cardiovascular:  s1s2 irregular, ST with PVC's on monitor  Lungs:  resp's even/non-labored, dec BS bilaterally Abdomen:  Rounds/soft, bsx4 active  Musculoskeletal:  No acute deformities  Skin:  Warm upper extremities, LE's cool with  ulcerations  LABS:  CBC  Recent Labs Lab 05/11/14 0630 05/12/14 0415 05/13/14 0420  WBC 7.3 16.3* 11.3*  HGB 13.4 13.6 11.7*  HCT 41.1 40.4 35.0*  PLT 204 286 187   Coag's  Recent Labs Lab 05/11/14 0630 05/12/14 1030 05/13/14 0420  INR 1.51* 1.38 1.52*   BMET  Recent Labs Lab 05/11/14 2359 05/12/14 0851 05/13/14 1030  NA 139 137 142  K 5.0 4.6 3.8  CL 102 102 101  CO2 21 21 26   BUN 35* 37* 31*  CREATININE 1.58* 1.71* 1.28  GLUCOSE 216* 188* 142*   Electrolytes  Recent Labs Lab 05/07/14 2155 05/08/14 0348  05/11/14 2359 05/12/14 0851 05/13/14 1030  CALCIUM 10.2  --   < > 9.1 9.1 9.1  MG  --  2.0  --  1.8  --   --   < > = values in this interval not displayed. Sepsis Markers No results found for this basename: LATICACIDVEN, PROCALCITON, O2SATVEN,  in the last 168 hours  ABG  Recent Labs Lab 05/11/14 1502 05/11/14 1747 05/12/14 0425  PHART 7.165* 7.265* 7.393  PCO2ART 70.9* 50.9* 34.2*  PO2ART 349.0* 81.0 116.0*    Liver Enzymes  Recent Labs Lab 05/07/14 2155  AST 31  ALT 17  ALKPHOS 32*  BILITOT 0.6  ALBUMIN 4.4   Cardiac Enzymes  Recent Labs Lab 05/07/14 2155 05/08/14 0352 05/08/14 1128 05/10/14 0229 05/11/14 1530  TROPONINI <0.30 <0.30 <0.30  --   --   PROBNP  --   --   --  820.3* 1302.0*   Glucose  Recent Labs Lab 05/12/14 1708 05/12/14 2042 05/12/14 2312 05/13/14 0323 05/13/14 0824 05/13/14 1134  GLUCAP 132* 126* 137* 145* 130* 112*    Imaging Dg Chest Port 1 View  05/12/2014   CLINICAL DATA:  Hypoxia  EXAM: PORTABLE CHEST - 1 VIEW  COMPARISON:  May 11, 2014  FINDINGS: Endotracheal tube tip is 4.4 cm above the carina. Nasogastric tube tip and side port are below the diaphragm. No pneumothorax. There is patchy consolidation in the left base, stable. Lungs elsewhere clear. Heart is mildly enlarged with pulmonary vascularity within normal limits.  IMPRESSION: Tube positions as described without pneumothorax.  Persistent patchy consolidation left base. No new opacity. Cardiomegaly present.   Electronically Signed   By: Lowella Grip M.D.   On: 05/12/2014 07:00     ASSESSMENT / PLAN:  PULMONARY OETT 9/23>> A: Acute Hypoxic Respiratory Distress Possible Aspiration - during respiratory distress in cath lab Pulmonary Edema - concern for flash pulmonary edema during cardiac cath COPD OSA P:   Extubated Duonebs scheduled then PRN CHF team following with management on diuretics Incentive spirometry Do no lay completely supine  CARDIOVASCULAR CVL (veinous sheath R groin) 9/23>> A:  CAD - s/p LHC with non-critical disease. Hypotension - related to sedation, resolved.  PVC's / Tachycardia  Hx HTN PVD S/p aortic valve replacement Hx CABG x2 P:  Cardiology deferring  management of CAD until pt stable ASA Heparin gtt per Cards Levophed gtt to maintain MAP >65 (off since return from Cath lab) Continue Lipitor, fenofibrate  Change toprol xl to IV dosing for now  Lasix per CHF team recs  RENAL A:  At Risk AKI  P:   Trend Sr Cr Ensure adequate perfusion   GASTROINTESTINAL A:   Obesity  Vent Associated Dysphagia  P:   SUP: Protonix  NPO OGT to LIS Consider TF in am if not extubated   HEMATOLOGIC A:   No acute issues  P:  Monitor CBC  INFECTIOUS A:   ? Aspiration PNA - new R mild basilar infiltrate 9/23 Unasyn >> P: Leukocytosis  Cont with Unasyn Day (2/x) Assess CXR to ensure no infiltrate  ENDOCRINE A:  DM  Gout P:   SSI  Continue allopurinol for now Hold amaryl  NEUROLOGIC A:  Peripheral neuropathy Chronic pain P:   RASS goal: -1 Fentanyl gtt PRN versed Continue lyrica     Family/patient updated:  By Day 3  Interdisciplinary Family Meeting v Palliative Care Meeting:  n/a By day 7   TODAY'S SUMMARY: 73 y/o M admitted with chest pain.  LHC on 9/23 with respiratory distress requiring intubation. Suspect pulmonary edema in addition to mild  aspiration. CHF team following.  Extubated with no complication on 9/59. Going for RCA PCI today   CRITICAL CARE: The patient is critically ill with multiple organ systems failure and requires high complexity decision making for assessment and support, frequent evaluation and titration of therapies, application of advanced monitoring technologies and extensive interpretation of multiple databases. Critical Care Time devoted to patient care services described in this note is 35 minutes.    Vilinda Boehringer, MD [place] [facility] Pager [phone] On Call Pager [phone]

## 2014-05-14 LAB — BASIC METABOLIC PANEL
Anion gap: 13 (ref 5–15)
BUN: 27 mg/dL — AB (ref 6–23)
CO2: 25 mEq/L (ref 19–32)
CREATININE: 1.21 mg/dL (ref 0.50–1.35)
Calcium: 8.9 mg/dL (ref 8.4–10.5)
Chloride: 101 mEq/L (ref 96–112)
GFR calc Af Amer: 67 mL/min — ABNORMAL LOW (ref >90)
GFR, EST NON AFRICAN AMERICAN: 58 mL/min — AB (ref >90)
GLUCOSE: 142 mg/dL — AB (ref 70–99)
Potassium: 4 mEq/L (ref 3.7–5.3)
Sodium: 139 mEq/L (ref 137–147)

## 2014-05-14 LAB — CBC
HCT: 33.5 % — ABNORMAL LOW (ref 39.0–52.0)
Hemoglobin: 10.9 g/dL — ABNORMAL LOW (ref 13.0–17.0)
MCH: 29.5 pg (ref 26.0–34.0)
MCHC: 32.5 g/dL (ref 30.0–36.0)
MCV: 90.8 fL (ref 78.0–100.0)
PLATELETS: 144 10*3/uL — AB (ref 150–400)
RBC: 3.69 MIL/uL — ABNORMAL LOW (ref 4.22–5.81)
RDW: 14.6 % (ref 11.5–15.5)
WBC: 9.4 10*3/uL (ref 4.0–10.5)

## 2014-05-14 LAB — GLUCOSE, CAPILLARY
GLUCOSE-CAPILLARY: 149 mg/dL — AB (ref 70–99)
GLUCOSE-CAPILLARY: 154 mg/dL — AB (ref 70–99)
GLUCOSE-CAPILLARY: 181 mg/dL — AB (ref 70–99)
Glucose-Capillary: 131 mg/dL — ABNORMAL HIGH (ref 70–99)
Glucose-Capillary: 179 mg/dL — ABNORMAL HIGH (ref 70–99)

## 2014-05-14 LAB — HEPARIN LEVEL (UNFRACTIONATED): Heparin Unfractionated: 0.53 IU/mL (ref 0.30–0.70)

## 2014-05-14 LAB — PROTIME-INR
INR: 1.58 — ABNORMAL HIGH (ref 0.00–1.49)
Prothrombin Time: 18.9 seconds — ABNORMAL HIGH (ref 11.6–15.2)

## 2014-05-14 MED ORDER — WARFARIN SODIUM 7.5 MG PO TABS
7.5000 mg | ORAL_TABLET | Freq: Once | ORAL | Status: AC
  Administered 2014-05-14: 7.5 mg via ORAL
  Filled 2014-05-14: qty 1

## 2014-05-14 NOTE — Progress Notes (Signed)
Patient Name: Tanner Pena      SUBJECTIVE:  Without chest pain or shortness of breath  Known CAD with CABG 1990 with redo bypass surgery in 199;  He also has mechanical AVR and chronic LBBB. DM and peripheral vasc disease  Was  admitted 9/19 with unstable angina. Cardiac markers negative. Last cath 2005 with 6 patent bypass grafts with high grade disease in the SVG to RCA treated with a Taxus DES. That procedure was complicated by perforation of the SVG in the area of stent placement.   Echo 9/20>>EF 45-50% w mech valve in place  Cath 9/23 demonstrated restenosis at RCA stent site w otherwise patent grafts, but procedure was complicated by respiratory failure requiring intubation and assoc with hypotension;  PCI anticipated when stable and was done 9/25   Past Medical History  Diagnosis Date  . Diabetes mellitus 2007  . Hypertension   . Gout   . Hypercholesteremia   . Peripheral vascular disease     stents lower extremity  . Chronic pain     foot, hips  . Sleep apnea   . COPD (chronic obstructive pulmonary disease)   . 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 Chronic back pain   . Dysphagia   . Neuromuscular disorder   . Peripheral neuropathy   . Critical lower limb ischemia     Scheduled Meds:  Scheduled Meds: . allopurinol  300 mg Oral QHS  . ampicillin-sulbactam (UNASYN) IV  3 g Intravenous Q8H  . antiseptic oral rinse  7 mL Mouth Rinse QID  . aspirin  81 mg Oral Daily  . atorvastatin  80 mg Oral q1800  . clopidogrel  75 mg Oral Q breakfast  . fenofibrate  160 mg Oral Daily  . insulin aspart  0-15 Units Subcutaneous TID WC  . ipratropium  0.5 mg Nebulization TID PC  . levalbuterol  0.63 mg Nebulization Q6H  . metoprolol  5 mg Intravenous 4 times per day  . omega-3 acid ethyl esters  1 g Oral BID  . pantoprazole (PROTONIX) IV  40 mg Intravenous Q24H  . povidone-iodine   Topical Q1200  . pregabalin  50 mg Oral QHS  . sodium chloride  3  mL Intravenous Q12H  . warfarin  7.5 mg Oral ONCE-1800  . Warfarin - Pharmacist Dosing Inpatient   Does not apply q1800   Continuous Infusions: . sodium chloride Stopped (05/12/14 0930)  . heparin 1,400 Units/hr (05/14/14 0800)   sodium chloride, acetaminophen, acetaminophen, hydrALAZINE, nitroGLYCERIN, ondansetron (ZOFRAN) IV, ondansetron (ZOFRAN) IV, oxyCODONE-acetaminophen, sodium chloride    PHYSICAL EXAM Filed Vitals:   05/14/14 0700 05/14/14 0725 05/14/14 0800 05/14/14 0910  BP: 136/55  144/45   Pulse: 31 34 63   Temp:  97.9 F (36.6 C)    TempSrc:  Oral    Resp: 13 18 16    Height:      Weight:      SpO2: 98% 98% 100% 98%   Well developed and nourished in no acute distress HENT normal Neck supple with JVP-flat Clear Regular rate and rhythm, 2/6 murmur mechanical s2 Abd-soft with active BS No Clubbing cyanosis L>R 1+edema Skin-warm and dry A & Oriented  Grossly normal sensory and motor function  TELEMETRY: Reviewed telemetry pt in  nsr   Intake/Output Summary (Last 24 hours) at 05/14/14 0916 Last data filed at 05/14/14 0800  Gross per 24 hour  Intake 2278.53 ml  Output  2975 ml  Net -696.47 ml    LABS: Basic Metabolic Panel:  Recent Labs Lab 05/08/14 0348 05/09/14 0447 05/10/14 0229 05/11/14 1623 05/11/14 2359 05/12/14 0851 05/13/14 1030 05/14/14 0333  NA  --  142 140 139 139 137 142 139  K  --  4.4 4.5 4.6 5.0 4.6 3.8 4.0  CL  --  105 104 102 102 102 101 101  CO2  --  26 27 25 21 21 26 25   GLUCOSE  --  114* 123* 222* 216* 188* 142* 142*  BUN  --  21 29* 31* 35* 37* 31* 27*  CREATININE  --  1.04 1.05 1.39* 1.58* 1.71* 1.28 1.21  CALCIUM  --  9.6 9.4 9.1 9.1 9.1 9.1 8.9  MG 2.0  --   --   --  1.8  --   --   --    Cardiac Enzymes: No results found for this basename: CKTOTAL, CKMB, CKMBINDEX, TROPONINI,  in the last 72 hours CBC:  Recent Labs Lab 05/07/14 2155 05/09/14 0447 05/10/14 0229 05/11/14 0630 05/12/14 0415 05/13/14 0420  05/14/14 0333  WBC 9.2 7.8 7.3 7.3 16.3* 11.3* 9.4  NEUTROABS 5.3  --   --   --   --   --   --   HGB 14.4 12.3* 12.4* 13.4 13.6 11.7* 10.9*  HCT 43.2 38.2* 37.8* 41.1 40.4 35.0* 33.5*  MCV 90.9 93.4 91.1 93.6 89.0 89.7 90.8  PLT 256 215 207 204 286 187 144*   PROTIME:  Recent Labs  05/12/14 1030 05/13/14 0420 05/14/14 0333  LABPROT 17.0* 18.3* 18.9*  INR 1.38 1.52* 1.58*   Liver Function Tests: No results found for this basename: AST, ALT, ALKPHOS, BILITOT, PROT, ALBUMIN,  in the last 72 hours No results found for this basename: LIPASE, AMYLASE,  in the last 72 hours BNP: BNP (last 3 results)  Recent Labs  01/19/14 2042 05/10/14 0229 05/11/14 1530  PROBNP 1373.0* 820.3* 1302.0*     ASSESSMENT AND PLAN:  Principal Problem:   Unstable angina Active Problems:   Long term (current) use of anticoagulants   H/O aortic valve replacement- [employer]   CAD - CABG '90 with re do '94    Tobacco abuse   PVD prior SFA PTA with chronic LE disease, not amenable to PTA   DM2 (diabetes mellitus, type 2)   Carotid stenosis   Hyperlipidemia with target LDL less than 70   Chest pain on admission MI r/o- Myoview negative 08/13/13   Cardiomyopathy, ischemic - EF 25-30% by echo 5/14   COPD (chronic obstructive pulmonary disease)   LBBB (left bundle branch block)   Warfarin-induced coagulopathy   HTN (hypertension)   PVC's (premature ventricular contractions)  Stable post pci Will transfer to stepdown On ABx for pneumonia d4/??  Home when INR therapeutic  Signed, Virl Axe MD  05/14/2014

## 2014-05-14 NOTE — Progress Notes (Signed)
CARDIAC REHAB PHASE I   PRE:  Rate/Rhythm: 27 SR with frequent PVC  BP:  Sitting: 138/72     SaO2: 98 3L  MODE:  Ambulation: 170 ft   POST:  Rate/Rhythm: 89 SR with couplets  BP:  Sitting: 157/50    SaO2: 98 RA  Pt walked 112ft with RW and assist x2.  Pt slightly unstable during ambulation due to ulcers on toes.  Pt overall tolerated walk well on RA, with O2 sat remaining stable at 96-97% and no c/o of SOB.  Returned pt to recliner with call bell in reach and family in room.  Pt did not have any questions at this time.  Cardiac rehab will f/u on Monday. HersheyBlenda Nicely MS, ACSM RCEP 10:06 AM 05/14/2014

## 2014-05-14 NOTE — Progress Notes (Signed)
ANTICOAGULATION CONSULT NOTE - Follow Up Consult  Pharmacy Consult for Heparin and Coumadin Indication: mechanical AVR  No Known Allergies  Patient Measurements: Height: 5\' 10"  (177.8 cm) Weight: 212 lb 8.4 oz (96.4 kg) IBW/kg (Calculated) : 73 Heparin Dosing Weight: 93kg  Vital Signs: Temp: 97.9 F (36.6 C) (09/26 0725) Temp src: Oral (09/26 0725) BP: 136/55 mmHg (09/26 0700) Pulse Rate: 34 (09/26 0725)  Labs:  Recent Labs  05/12/14 0415 05/12/14 0851  05/12/14 1030 05/12/14 1830 05/13/14 0420 05/13/14 1030 05/14/14 0333 05/14/14 0630  HGB 13.6  --   --   --   --  11.7*  --  10.9*  --   HCT 40.4  --   --   --   --  35.0*  --  33.5*  --   PLT 286  --   --   --   --  187  --  144*  --   LABPROT  --   --   --  17.0*  --  18.3*  --  18.9*  --   INR  --   --   --  1.38  --  1.52*  --  1.58*  --   HEPARINUNFRC  --   --   < > 0.42 0.42 0.75*  --   --  0.53  CREATININE  --  1.71*  --   --   --   --  1.28 1.21  --   < > = values in this interval not displayed.  Estimated Creatinine Clearance: 63.4 ml/min (by C-G formula based on Cr of 1.21).   Medications:  Heparin @ 1400 units/hr  Assessment: 73yom s/p cath yesterday with successful PCI of the SVG to the right coronary artery. He was resumed on heparin post cath as well as coumadin for his mechanical AVR. Heparin level is therapeutic. INR remains below goal after 1 dose of coumadin. His platelets are gradually decreasing (286-->187-->144). If they continue to decrease may need to evaluate for HIT (calculated a moderate T-score).  Home coumadin dose: 5mg  daily except 7.5mg  on Mon and Fri  Goal of Therapy:  INR 2.5-3.5 Monitor platelets by anticoagulation protocol: Yes   Plan:  1) Continue heparin at 1400 units/hr for now 2) Repeat coumadin 7.5mg  x 1 3) Follow up heparin level, INR, CBC in AM  Deboraha Sprang 05/14/2014,8:10 AM

## 2014-05-15 LAB — CBC
HCT: 33.7 % — ABNORMAL LOW (ref 39.0–52.0)
HEMOGLOBIN: 11.1 g/dL — AB (ref 13.0–17.0)
MCH: 29.4 pg (ref 26.0–34.0)
MCHC: 32.9 g/dL (ref 30.0–36.0)
MCV: 89.4 fL (ref 78.0–100.0)
PLATELETS: 153 10*3/uL (ref 150–400)
RBC: 3.77 MIL/uL — ABNORMAL LOW (ref 4.22–5.81)
RDW: 14.3 % (ref 11.5–15.5)
WBC: 7.8 10*3/uL (ref 4.0–10.5)

## 2014-05-15 LAB — PROTIME-INR
INR: 1.69 — ABNORMAL HIGH (ref 0.00–1.49)
Prothrombin Time: 19.9 seconds — ABNORMAL HIGH (ref 11.6–15.2)

## 2014-05-15 LAB — GLUCOSE, CAPILLARY
GLUCOSE-CAPILLARY: 208 mg/dL — AB (ref 70–99)
Glucose-Capillary: 188 mg/dL — ABNORMAL HIGH (ref 70–99)

## 2014-05-15 LAB — HEPARIN LEVEL (UNFRACTIONATED): Heparin Unfractionated: 0.44 IU/mL (ref 0.30–0.70)

## 2014-05-15 MED ORDER — WARFARIN SODIUM 7.5 MG PO TABS
7.5000 mg | ORAL_TABLET | Freq: Once | ORAL | Status: AC
  Administered 2014-05-15: 7.5 mg via ORAL
  Filled 2014-05-15 (×2): qty 1

## 2014-05-15 NOTE — Progress Notes (Signed)
Patient Name: Tanner Pena      SUBJECTIVE:  Without chest pain or shortness of breath  Known CAD with CABG 1990 with redo bypass surgery in 199;  He also has mechanical AVR and chronic LBBB. DM and peripheral vasc disease  Was  admitted 9/19 with unstable angina. Cardiac markers negative. Last cath 2005 with 6 patent bypass grafts with high grade disease in the SVG to RCA treated with a Taxus DES. That procedure was complicated by perforation of the SVG in the area of stent placement.   Echo 9/20>>EF 45-50% w mech valve in place  Cath 9/23 demonstrated restenosis at RCA stent site w otherwise patent grafts, but procedure was complicated by respiratory failure requiring intubation and assoc with hypotension;  PCI anticipated when stable and was done 9/25  NOw waiting for INR to become therapeutic  No CP or SOB   Past Medical History  Diagnosis Date  . Diabetes mellitus 2007  . Hypertension   . Gout   . Hypercholesteremia   . Peripheral vascular disease     stents lower extremity  . Chronic pain     foot, hips  . Sleep apnea   . COPD (chronic obstructive pulmonary disease)   . 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 Chronic back pain   . Dysphagia   . Neuromuscular disorder   . Peripheral neuropathy   . Critical lower limb ischemia     Scheduled Meds:  Scheduled Meds: . allopurinol  300 mg Oral QHS  . ampicillin-sulbactam (UNASYN) IV  3 g Intravenous Q8H  . aspirin  81 mg Oral Daily  . atorvastatin  80 mg Oral q1800  . clopidogrel  75 mg Oral Q breakfast  . fenofibrate  160 mg Oral Daily  . insulin aspart  0-15 Units Subcutaneous TID WC  . ipratropium  0.5 mg Nebulization TID PC  . levalbuterol  0.63 mg Nebulization Q6H  . metoprolol  5 mg Intravenous 4 times per day  . omega-3 acid ethyl esters  1 g Oral BID  . pantoprazole (PROTONIX) IV  40 mg Intravenous Q24H  . povidone-iodine   Topical Q1200  . pregabalin  50 mg Oral QHS  . sodium  chloride  3 mL Intravenous Q12H  . warfarin  7.5 mg Oral ONCE-1800  . Warfarin - Pharmacist Dosing Inpatient   Does not apply q1800   Continuous Infusions: . sodium chloride Stopped (05/12/14 0930)  . heparin 1,400 Units/hr (05/14/14 1339)   sodium chloride, acetaminophen, hydrALAZINE, nitroGLYCERIN, ondansetron (ZOFRAN) IV, oxyCODONE-acetaminophen, sodium chloride    PHYSICAL EXAM Filed Vitals:   05/15/14 0500 05/15/14 0600 05/15/14 0700 05/15/14 0716  BP: 107/29 129/30 113/22 137/49  Pulse: 61 33 30 68  Temp:    97.9 F (36.6 C)  TempSrc:    Oral  Resp: 15 16 14 18   Height:      Weight:    211 lb 13.8 oz (96.1 kg)  SpO2: 91% 96% 100% 94%   Well developed and nourished in no acute distress HENT normal Neck supple with JVP-flat Clear Regular rate and rhythm, 2/6 murmur mechanical s2 Abd-soft with active BS No Clubbing cyanosis L>R 1+edema Skin-warm and dry A & Oriented  Grossly normal sensory and motor function  TELEMETRY: Reviewed telemetry pt in  nsr   Intake/Output Summary (Last 24 hours) at 05/15/14 0830 Last data filed at 05/15/14 0700  Gross per 24 hour  Intake  1942 ml  Output   1700 ml  Net    242 ml    LABS: Basic Metabolic Panel:  Recent Labs Lab 05/09/14 0447 05/10/14 0229 05/11/14 1623 05/11/14 2359 05/12/14 0851 05/13/14 1030 05/14/14 0333  NA 142 140 139 139 137 142 139  K 4.4 4.5 4.6 5.0 4.6 3.8 4.0  CL 105 104 102 102 102 101 101  CO2 26 27 25 21 21 26 25   GLUCOSE 114* 123* 222* 216* 188* 142* 142*  BUN 21 29* 31* 35* 37* 31* 27*  CREATININE 1.04 1.05 1.39* 1.58* 1.71* 1.28 1.21  CALCIUM 9.6 9.4 9.1 9.1 9.1 9.1 8.9  MG  --   --   --  1.8  --   --   --    Cardiac Enzymes: No results found for this basename: CKTOTAL, CKMB, CKMBINDEX, TROPONINI,  in the last 72 hours CBC:  Recent Labs Lab 05/09/14 0447 05/10/14 0229 05/11/14 0630 05/12/14 0415 05/13/14 0420 05/14/14 0333 05/15/14 0315  WBC 7.8 7.3 7.3 16.3* 11.3* 9.4 7.8    HGB 12.3* 12.4* 13.4 13.6 11.7* 10.9* 11.1*  HCT 38.2* 37.8* 41.1 40.4 35.0* 33.5* 33.7*  MCV 93.4 91.1 93.6 89.0 89.7 90.8 89.4  PLT 215 207 204 286 187 144* 153   PROTIME:  Recent Labs  05/13/14 0420 05/14/14 0333 05/15/14 0315  LABPROT 18.3* 18.9* 19.9*  INR 1.52* 1.58* 1.69*   Liver Function Tests: No results found for this basename: AST, ALT, ALKPHOS, BILITOT, PROT, ALBUMIN,  in the last 72 hours No results found for this basename: LIPASE, AMYLASE,  in the last 72 hours BNP: BNP (last 3 results)  Recent Labs  01/19/14 2042 05/10/14 0229 05/11/14 1530  PROBNP 1373.0* 820.3* 1302.0*     ASSESSMENT AND PLAN:  Principal Problem:   Unstable angina Active Problems:   Long term (current) use of anticoagulants   H/O aortic valve replacement- [employer]   CAD - CABG '90 with re do '94    Tobacco abuse   PVD prior SFA PTA with chronic LE disease, not amenable to PTA   DM2 (diabetes mellitus, type 2)   Carotid stenosis   Hyperlipidemia with target LDL less than 70   Chest pain on admission MI r/o- Myoview negative 08/13/13   Cardiomyopathy, ischemic - EF 25-30% by echo 5/14   COPD (chronic obstructive pulmonary disease)   LBBB (left bundle branch block)   Warfarin-induced coagulopathy   HTN (hypertension)   PVC's (premature ventricular contractions)  Stable post pci Will transfer to floor  On ABx for pneumonia d5/7   Home when INR therapeutic  Signed, Virl Axe MD  05/15/2014

## 2014-05-15 NOTE — Progress Notes (Signed)
Patient arrived on unit, vital signs stable.  Patient denies any questions or concerns at this time.  Will continue to monitor.

## 2014-05-15 NOTE — Progress Notes (Signed)
ANTICOAGULATION CONSULT NOTE - Follow Up Consult  Pharmacy Consult for Heparin and Coumadin Indication: mechanical AVR  No Known Allergies  Patient Measurements: Height: 5\' 10"  (177.8 cm) Weight: 211 lb 13.8 oz (96.1 kg) IBW/kg (Calculated) : 73 Heparin Dosing Weight: 93kg  Vital Signs: Temp: 97.9 F (36.6 C) (09/27 0716) Temp src: Oral (09/27 0716) BP: 137/49 mmHg (09/27 0716) Pulse Rate: 68 (09/27 0716)  Labs:  Recent Labs  05/12/14 0851  05/13/14 0420 05/13/14 1030 05/14/14 0333 05/14/14 0630 05/15/14 0315  HGB  --   < > 11.7*  --  10.9*  --  11.1*  HCT  --   --  35.0*  --  33.5*  --  33.7*  PLT  --   --  187  --  144*  --  153  LABPROT  --   < > 18.3*  --  18.9*  --  19.9*  INR  --   < > 1.52*  --  1.58*  --  1.69*  HEPARINUNFRC  --   < > 0.75*  --   --  0.53 0.44  CREATININE 1.71*  --   --  1.28 1.21  --   --   < > = values in this interval not displayed.  Estimated Creatinine Clearance: 63.2 ml/min (by C-G formula based on Cr of 1.21).   Medications:  Heparin @ 1400 units/hr  Assessment: 73yom s/p cath 9/25 with successful PCI of the SVG to the right coronary artery. He was resumed on heparin post cath as well as coumadin for his mechanical AVR. Heparin level is therapeutic. INR remains below goal but starting to trend up. His platelets are actually better today. No bleeding reported.  Home coumadin dose: 5mg  daily except 7.5mg  on Mon and Fri  Goal of Therapy:  INR 2.5-3.5 Monitor platelets by anticoagulation protocol: Yes   Plan:  1) Continue heparin at 1400 units/hr for now 2) Repeat coumadin 7.5mg  x 1 3) Follow up heparin level, INR, CBC in AM  Deboraha Sprang 05/15/2014,8:14 AM

## 2014-05-16 DIAGNOSIS — Z7901 Long term (current) use of anticoagulants: Secondary | ICD-10-CM

## 2014-05-16 DIAGNOSIS — J438 Other emphysema: Secondary | ICD-10-CM

## 2014-05-16 LAB — GLUCOSE, CAPILLARY
Glucose-Capillary: 35 mg/dL — CL (ref 70–99)
Glucose-Capillary: 148 mg/dL — ABNORMAL HIGH (ref 70–99)
Glucose-Capillary: 143 mg/dL — ABNORMAL HIGH (ref 70–99)
Glucose-Capillary: 171 mg/dL — ABNORMAL HIGH (ref 70–99)
Glucose-Capillary: 198 mg/dL — ABNORMAL HIGH (ref 70–99)
Glucose-Capillary: 205 mg/dL — ABNORMAL HIGH (ref 70–99)
Glucose-Capillary: 180 mg/dL — ABNORMAL HIGH (ref 70–99)

## 2014-05-16 LAB — HEPARIN LEVEL (UNFRACTIONATED)
Heparin Unfractionated: 0.17 IU/mL — ABNORMAL LOW (ref 0.30–0.70)
Heparin Unfractionated: 0.3 IU/mL (ref 0.30–0.70)

## 2014-05-16 LAB — CBC
HEMATOCRIT: 33.6 % — AB (ref 39.0–52.0)
Hemoglobin: 11.1 g/dL — ABNORMAL LOW (ref 13.0–17.0)
MCH: 29.8 pg (ref 26.0–34.0)
MCHC: 33 g/dL (ref 30.0–36.0)
MCV: 90.1 fL (ref 78.0–100.0)
Platelets: 170 10*3/uL (ref 150–400)
RBC: 3.73 MIL/uL — ABNORMAL LOW (ref 4.22–5.81)
RDW: 14.2 % (ref 11.5–15.5)
WBC: 6.7 10*3/uL (ref 4.0–10.5)

## 2014-05-16 LAB — PROTIME-INR
INR: 1.73 — ABNORMAL HIGH (ref 0.00–1.49)
PROTHROMBIN TIME: 20.3 s — AB (ref 11.6–15.2)

## 2014-05-16 MED ORDER — METOPROLOL SUCCINATE ER 50 MG PO TB24
50.0000 mg | ORAL_TABLET | Freq: Every day | ORAL | Status: DC
  Administered 2014-05-16 – 2014-05-17 (×2): 50 mg via ORAL
  Filled 2014-05-16 (×2): qty 1

## 2014-05-16 MED ORDER — DOCUSATE SODIUM 100 MG PO CAPS
100.0000 mg | ORAL_CAPSULE | Freq: Every day | ORAL | Status: DC | PRN
  Filled 2014-05-16 (×2): qty 1

## 2014-05-16 MED ORDER — PANTOPRAZOLE SODIUM 40 MG PO TBEC
40.0000 mg | DELAYED_RELEASE_TABLET | Freq: Every day | ORAL | Status: DC
  Administered 2014-05-16 – 2014-05-17 (×2): 40 mg via ORAL
  Filled 2014-05-16 (×2): qty 1

## 2014-05-16 MED ORDER — WARFARIN SODIUM 7.5 MG PO TABS
7.5000 mg | ORAL_TABLET | Freq: Once | ORAL | Status: AC
  Administered 2014-05-16: 7.5 mg via ORAL
  Filled 2014-05-16: qty 1

## 2014-05-16 MED ORDER — POLYETHYLENE GLYCOL 3350 17 G PO PACK
17.0000 g | PACK | Freq: Every day | ORAL | Status: DC | PRN
  Filled 2014-05-16: qty 1

## 2014-05-16 MED ORDER — LEVALBUTEROL HCL 0.63 MG/3ML IN NEBU: 0.6300 mg | INHALATION_SOLUTION | Freq: Four times a day (QID) | RESPIRATORY_TRACT | Status: DC | PRN

## 2014-05-16 MED ORDER — IPRATROPIUM BROMIDE 0.02 % IN SOLN: 0.5000 mg | Freq: Four times a day (QID) | RESPIRATORY_TRACT | Status: DC | PRN

## 2014-05-16 MED FILL — Sodium Chloride IV Soln 0.9%: INTRAVENOUS | Qty: 50 | Status: AC

## 2014-05-16 NOTE — Progress Notes (Signed)
ANTICOAGULATION CONSULT NOTE - Follow Up Consult  Pharmacy Consult for Heparin Indication: mechanical AVR  No Known Allergies  Patient Measurements: Height: 5\' 10"  (177.8 cm) Weight: 213 lb 3 oz (96.7 kg) (scale a) IBW/kg (Calculated) : 73 Heparin Dosing Weight: 93kg  Vital Signs: Temp: 98.6 F (37 C) (09/27 2112) Temp src: Oral (09/27 2112) BP: 136/62 mmHg (09/27 2112) Pulse Rate: 70 (09/27 2112)  Labs:  Recent Labs  05/13/14 1030  05/14/14 0333 05/14/14 0630 05/15/14 0315 05/16/14 0400 05/16/14 0420  HGB  --   < > 10.9*  --  11.1* 11.1*  --   HCT  --   --  33.5*  --  33.7* 33.6*  --   PLT  --   --  144*  --  153 170  --   LABPROT  --   --  18.9*  --  19.9*  --  20.3*  INR  --   --  1.58*  --  1.69*  --  1.73*  HEPARINUNFRC  --   --   --  0.53 0.44  --  0.17*  CREATININE 1.28  --  1.21  --   --   --   --   < > = values in this interval not displayed.  Estimated Creatinine Clearance: 63.4 ml/min (by C-G formula based on Cr of 1.21).   Medications:  Heparin @ 1400 units/hr  Assessment: 73yom s/p cath 9/25 with successful PCI of the SVG to the right coronary artery. He was resumed on heparin post cath as well as coumadin for his mechanical AVR. Heparin level is now sub- therapeutic despite therapeutic levels for many days. INR remains below goal but starting to trend up. His platelets are trending up. No bleeding reported.    Goal of Therapy:  Heparin level: 0.3 - 0.7 INR 2.5-3.5 Monitor platelets by anticoagulation protocol: Yes   Plan:  1) Increase heparin infusion to 1650 units/hr. No bolus  2) Follow up 8-hr heparin level, daily INR, CBC in AM  Albertina Parr, PharmD.  Clinical Pharmacist Pager [phone]

## 2014-05-16 NOTE — Progress Notes (Addendum)
Patient Name: Tanner Pena Date of Encounter: 05/16/2014     Principal Problem:   Unstable angina Active Problems:   Long term (current) use of anticoagulants   H/O aortic valve replacement- [employer] Jude   CAD - CABG '90 with re do '94    Tobacco abuse   PVD prior SFA PTA with chronic LE disease, not amenable to PTA   DM2 (diabetes mellitus, type 2)   Carotid stenosis   Hyperlipidemia with target LDL less than 70   Chest pain on admission MI r/o- Myoview negative 08/13/13   Cardiomyopathy, ischemic - EF 25-30% by echo 5/14   COPD (chronic obstructive pulmonary disease)   LBBB (left bundle branch block)   Warfarin-induced coagulopathy   HTN (hypertension)   PVC's (premature ventricular contractions)    SUBJECTIVE  Feeling well. No CP or SOB. Complains of toe pain.   CURRENT MEDS . allopurinol  300 mg Oral QHS  . ampicillin-sulbactam (UNASYN) IV  3 g Intravenous Q8H  . aspirin  81 mg Oral Daily  . atorvastatin  80 mg Oral q1800  . clopidogrel  75 mg Oral Q breakfast  . fenofibrate  160 mg Oral Daily  . insulin aspart  0-15 Units Subcutaneous TID WC  . metoprolol  5 mg Intravenous 4 times per day  . omega-3 acid ethyl esters  1 g Oral BID  . pantoprazole  40 mg Oral Daily  . povidone-iodine   Topical Q1200  . pregabalin  50 mg Oral QHS  . warfarin  7.5 mg Oral ONCE-1800  . Warfarin - Pharmacist Dosing Inpatient   Does not apply q1800    OBJECTIVE  Filed Vitals:   05/16/14 0150 05/16/14 0738 05/16/14 0800 05/16/14 0900  BP:  132/58  135/92  Pulse:  61  64  Temp:  98.2 F (36.8 C)    TempSrc:  Oral  Oral  Resp:  18  18  Height:      Weight:  213 lb 1.6 oz (96.662 kg)    SpO2: 100% 98% 97% 97%    Intake/Output Summary (Last 24 hours) at 05/16/14 1158 Last data filed at 05/16/14 0913  Gross per 24 hour  Intake   1160 ml  Output   1051 ml  Net    109 ml   Filed Weights   05/15/14 0716 05/15/14 1308 05/16/14 0738  Weight: 211 lb 13.8 oz (96.1 kg) 213 lb  3 oz (96.7 kg) 213 lb 1.6 oz (96.662 kg)    PHYSICAL EXAM  Well developed and nourished in no acute distress  HENT normal  Neck supple with JVP-flat  Clear  Regular rate and rhythm, 2/6 murmur mechanical s2  Abd-soft with active BS  No Clubbing cyanosis L>R 1+edema  Skin-warm and dry. Wounds on left foot #1 and #2 metatarsals. Slight erythema. Covered in betadine  A & Oriented Grossly normal sensory and motor function   Accessory Clinical Findings  CBC  Recent Labs  05/15/14 0315 05/16/14 0400  WBC 7.8 6.7  HGB 11.1* 11.1*  HCT 33.7* 33.6*  MCV 89.4 90.1  PLT 153 127   Basic Metabolic Panel  Recent Labs  05/14/14 0333  NA 139  K 4.0  CL 101  CO2 25  GLUCOSE 142*  BUN 27*  CREATININE 1.21  CALCIUM 8.9    TELE  NSR with freq PVCs, chronic LBBB   Radiology/Studies  Dg Chest Port 1 View  05/12/2014   CLINICAL DATA:  Hypoxia  EXAM: PORTABLE  CHEST - 1 VIEW  COMPARISON:  May 11, 2014  FINDINGS: Endotracheal tube tip is 4.4 cm above the carina. Nasogastric tube tip and side port are below the diaphragm. No pneumothorax. There is patchy consolidation in the left base, stable. Lungs elsewhere clear. Heart is mildly enlarged with pulmonary vascularity within normal limits.  IMPRESSION: Tube positions as described without pneumothorax. Persistent patchy consolidation left base. No new opacity. Cardiomegaly present.   Electronically Signed   By: Lowella Grip M.D.   On: 05/12/2014 07:00   Dg Chest Port 1 View  05/11/2014   CLINICAL DATA:  Intubation, endotracheal tube placement, history COPD, diabetes, hypertension, AVR  EXAM: PORTABLE CHEST - 1 VIEW  COMPARISON:  Portable exam 1649 hr compared to 05/07/2014  FINDINGS: Tip of endotracheal tube projects 4.1 cm above carinal.  Nasogastric tube extends into stomach.  Enlargement of cardiac silhouette with pulmonary vascular congestion.  Atherosclerotic calcification aorta.  Question RIGHT jugular central venous  catheter tip projecting over confluence with innominate vein.  Atelectasis versus consolidation in LEFT lower lobe.  Mild RIGHT basilar infiltrate.  Biapical scarring.  No pleural effusion or pneumothorax.  IMPRESSION: Enlargement of cardiac silhouette with pulmonary vascular congestion post median sternotomy, CABG gait AVR.  Persistent atelectasis versus consolidation in LEFT lower lobe with mild RIGHT basilar infiltrate.   Electronically Signed   By: Lavonia Dana M.D.   On: 05/11/2014 17:13   Dg Chest Port 1 View  05/07/2014   CLINICAL DATA:  Worsening chest pain and shortness breath  EXAM: PORTABLE CHEST - 1 VIEW  COMPARISON:  None.  FINDINGS: Sternotomy wires overlie the stable enlarged cardiac silhouette. There is chronic bronchitic markings and mild central venous congestion. Left basilar atelectasis similar prior.  IMPRESSION: 1. No interval change. 2. Cardiomegaly, central venous congestion and left basilar atelectasis.   Electronically Signed   By: Suzy Bouchard M.D.   On: 05/07/2014 21:43   Dg Abd Portable 1v  05/11/2014   CLINICAL DATA:  Orogastric tube placement  EXAM: PORTABLE ABDOMEN - 1 VIEW  COMPARISON:  None.  FINDINGS: Orogastric tube is identified crossing the gastroesophageal junction, are with side hole and tip projecting over the proximal body of the stomach.  IMPRESSION: NG tube projects over the stomach.   Electronically Signed   By: Skipper Cliche M.D.   On: 05/11/2014 18:34   Dg Abd Portable 1v  05/11/2014   CLINICAL DATA:  OG tube placement.  EXAM: PORTABLE ABDOMEN - 1 VIEW  COMPARISON:  None.  FINDINGS: Examination demonstrates an enteric tube which can be followed down to the level of the stomach in the left upper quadrant where the tip then becomes hazy and difficult to define. There is slight elevation of the left hemidiaphragm. Minimal opacification in the left base likely atelectasis. Bowel gas pattern is nonobstructive. Sternotomy wires present with fracture of the most  inferior wire.  IMPRESSION: Nonobstructive gas pattern. Enteric tube which enters the region of the stomach in the left upper quadrant as tip is not definitely visualized.   Electronically Signed   By: Marin Olp M.D.   On: 05/11/2014 17:13    ASSESSMENT AND PLAN  Tanner Pena HUISH is a 73 y.o. male with a history of CAD s/p CABG '90 with re do '94, tobacco abuse, DMT2, carotid stenosis, HLD, COPD, LBBB, HTN, PVD and mechanical AVR who was admitted on 05/07/14 with [name].   [name]- Cardiac markers negative. Last cath 2005 with 6 patent bypass grafts with high  grade disease in the SVG to RCA treated with a Taxus DES. That procedure was complicated by perforation of the SVG in the area of stent placement.  -- Cath 05/11/14 demonstrated restenosis at RCA stent site w otherwise patent grafts, but procedure was complicated by respiratory failure requiring intubation and assoc with hypotension; PCI anticipated when stable and was done 9/25 -- Continue ASA and Plavix  S/p St. Jude Mechanical AVR- Echo 9/20>>EF 45-50% w mech valve in place -- Now waiting for INR to become therapeutic. Plan is to DC home when INR >2. Currently 1.7. Likely will be able to go home in the next couple days.  -- Previous hx of Warfarin-induced coagulopathy   Tobacco abuse - counseled on cessation. Offered patches for cessation aid.  PVD prior SFA PTA with chronic LE disease, not amenable to PTA   DM2 (diabetes mellitus, type 2)   Carotid stenosis   Hyperlipidemia with target LDL less than 70  -- Continue statin, lovaza and fenofibrate  Cardiomyopathy, ischemic - EF 25-30% by echo 5/14   LBBB   HTN- on IV lopressor 5mg  QID. Consider placing on PO.  PVC's- hx of this.   Ulcers on left foot- 1st and second toes. Appear stable. He is complains of pain.    Judy Pimple PA-C  Pager [phone]  Personally seen and examined. Agree with above. Will DC IV Lopressor.  Restart Toprol XL 50 as he was on at  home.    Candee Furbish, MD

## 2014-05-16 NOTE — Progress Notes (Signed)
CARDIAC REHAB PHASE I   PRE:  Rate/Rhythm: 70 SR with PVC's  BP:  Supine:   Sitting: 112/40  Standing:    SaO2: 100 RA  MODE:  Ambulation: 412 ft   POST:  Rate/Rhythm: 81  BP:  Supine:   Sitting: 135/62  Standing:    SaO2: 98 RA 1415-1515 Assisted X 1 and used walker to ambulate. Gait steady with walker. Pt was able to walk 412 feet without c/o. VS stable Pt to bed after walk with call light in reach. Completed stent education with pt. We discussed smoking cessation. I gave him tips for quitting, quit smart class information and coaching contact number. Pt plans to quit" cold [place]." We will continue to follow. Pt states that walk made him feel better.  Rodney Langton RN 05/16/2014 3:22 PM

## 2014-05-16 NOTE — Progress Notes (Signed)
UR completed Tamila Gaulin K. Kimberly Nieland, RN, BSN, [place], CCM  05/16/2014 3:04 PM

## 2014-05-16 NOTE — Progress Notes (Signed)
ANTICOAGULATION CONSULT NOTE - Follow Up Consult  Pharmacy Consult for Heparin Indication: mechanical AVR  No Known Allergies  Patient Measurements: Height: 5\' 10"  (177.8 cm) Weight: 213 lb 1.6 oz (96.662 kg) (a scale) IBW/kg (Calculated) : 73 Heparin Dosing Weight: 93kg  Vital Signs: Temp: 98 F (36.7 C) (09/28 1433) Temp src: Oral (09/28 1433) BP: 143/47 mmHg (09/28 1602) Pulse Rate: 62 (09/28 1602)  Labs:  Recent Labs  05/14/14 0333  05/15/14 0315 05/16/14 0400 05/16/14 0420 05/16/14 1454  HGB 10.9*  --  11.1* 11.1*  --   --   HCT 33.5*  --  33.7* 33.6*  --   --   PLT 144*  --  153 170  --   --   LABPROT 18.9*  --  19.9*  --  20.3*  --   INR 1.58*  --  1.69*  --  1.73*  --   HEPARINUNFRC  --   < > 0.44  --  0.17* 0.30  CREATININE 1.21  --   --   --   --   --   < > = values in this interval not displayed.  Estimated Creatinine Clearance: 63.4 ml/min (by C-G formula based on Cr of 1.21).   Medications:  Heparin @ 1650 units/hr  Assessment: 73yom s/p cath 9/25 with successful PCI of the SVG to the right coronary artery. He was resumed on heparin post cath as well as coumadin for his mechanical AVR. Heparin level is now  Therapeutic afer heparin rate increased this AM to 1650 units/hr. HL = 0.3.  INR = 1.73, remains below goal but starting to trend up. His platelets are trending up. No bleeding reported.    Goal of Therapy:  Heparin level: 0.3 - 0.7 INR 2.5-3.5 Monitor platelets by anticoagulation protocol: Yes   Plan:  Continue heparin infusion to 1650 units/hr Coumadin 7.5 mg po today Daily INR, CBC in AM  Nicole Cella, RPh Clinical Pharmacist Pager: [phone] 05/16/2014 5:23 PM

## 2014-05-17 ENCOUNTER — Encounter (HOSPITAL_COMMUNITY): Payer: Self-pay | Admitting: Physician Assistant

## 2014-05-17 ENCOUNTER — Other Ambulatory Visit: Payer: Self-pay | Admitting: Physician Assistant

## 2014-05-17 DIAGNOSIS — I209 Angina pectoris, unspecified: Secondary | ICD-10-CM

## 2014-05-17 DIAGNOSIS — L97509 Non-pressure chronic ulcer of other part of unspecified foot with unspecified severity: Secondary | ICD-10-CM

## 2014-05-17 DIAGNOSIS — Z952 Presence of prosthetic heart valve: Secondary | ICD-10-CM

## 2014-05-17 LAB — CBC
HEMATOCRIT: 33.6 % — AB (ref 39.0–52.0)
HEMOGLOBIN: 11.1 g/dL — AB (ref 13.0–17.0)
MCH: 29.7 pg (ref 26.0–34.0)
MCHC: 33 g/dL (ref 30.0–36.0)
MCV: 89.8 fL (ref 78.0–100.0)
Platelets: 178 10*3/uL (ref 150–400)
RBC: 3.74 MIL/uL — AB (ref 4.22–5.81)
RDW: 14.1 % (ref 11.5–15.5)
WBC: 7 10*3/uL (ref 4.0–10.5)

## 2014-05-17 LAB — GLUCOSE, CAPILLARY
GLUCOSE-CAPILLARY: 147 mg/dL — AB (ref 70–99)
GLUCOSE-CAPILLARY: 195 mg/dL — AB (ref 70–99)

## 2014-05-17 LAB — HEPARIN LEVEL (UNFRACTIONATED): Heparin Unfractionated: 0.66 IU/mL (ref 0.30–0.70)

## 2014-05-17 LAB — BASIC METABOLIC PANEL
Anion gap: 10 (ref 5–15)
BUN: 20 mg/dL (ref 6–23)
CO2: 28 mEq/L (ref 19–32)
Calcium: 9.7 mg/dL (ref 8.4–10.5)
Chloride: 101 mEq/L (ref 96–112)
Creatinine, Ser: 0.98 mg/dL (ref 0.50–1.35)
GFR calc Af Amer: >90 mL/min (ref >90)
GFR calc non Af Amer: 80 mL/min — ABNORMAL LOW (ref >90)
Glucose, Bld: 196 mg/dL — ABNORMAL HIGH (ref 70–99)
Potassium: 4 mEq/L (ref 3.7–5.3)
Sodium: 139 mEq/L (ref 137–147)

## 2014-05-17 LAB — PROTIME-INR
INR: 2.43 — ABNORMAL HIGH (ref 0.00–1.49)
Prothrombin Time: 26.4 seconds — ABNORMAL HIGH (ref 11.6–15.2)

## 2014-05-17 MED ORDER — NITROGLYCERIN 0.4 MG SL SUBL: 0.4000 mg | SUBLINGUAL_TABLET | SUBLINGUAL | Status: DC | PRN

## 2014-05-17 MED ORDER — CLOPIDOGREL BISULFATE 75 MG PO TABS: 75.0000 mg | ORAL_TABLET | Freq: Every day | ORAL | Status: DC

## 2014-05-17 MED ORDER — NICOTINE 14 MG/24HR TD PT24
14.0000 mg | MEDICATED_PATCH | Freq: Every day | TRANSDERMAL | Status: DC
  Administered 2014-05-17: 14 mg via TRANSDERMAL
  Filled 2014-05-17: qty 1

## 2014-05-17 MED ORDER — METOPROLOL SUCCINATE ER 50 MG PO TB24: 50.0000 mg | ORAL_TABLET | Freq: Every day | ORAL | Status: DC

## 2014-05-17 MED ORDER — NICOTINE 14 MG/24HR TD PT24: 14.0000 mg | MEDICATED_PATCH | Freq: Every day | TRANSDERMAL | Status: DC

## 2014-05-17 MED ORDER — WARFARIN SODIUM 5 MG PO TABS: 5.0000 mg | ORAL_TABLET | Freq: Every day | ORAL | Status: DC

## 2014-05-17 NOTE — Progress Notes (Deleted)
ANTICOAGULATION CONSULT NOTE - Follow Up Consult  Pharmacy Consult for Heparin and coumadin Indication: mechanical AVR  No Known Allergies  Patient Measurements: Height: 5\' 10"  (177.8 cm) Weight: 216 lb 3.2 oz (98.068 kg) (scale a) IBW/kg (Calculated) : 73 Heparin Dosing Weight: 93kg  Vital Signs: Temp: 97.9 F (36.6 C) (09/29 0425) Temp src: Oral (09/29 0425) BP: 129/50 mmHg (09/29 0947) Pulse Rate: 68 (09/29 0947)  Labs:  Recent Labs  05/15/14 0315 05/16/14 0400 05/16/14 0420 05/16/14 1454 05/17/14 0428 05/17/14 1010  HGB 11.1* 11.1*  --   --  11.1*  --   HCT 33.7* 33.6*  --   --  33.6*  --   PLT 153 170  --   --  178  --   LABPROT 19.9*  --  20.3*  --  26.4*  --   INR 1.69*  --  1.73*  --  2.43*  --   HEPARINUNFRC 0.44  --  0.17* 0.30 0.66  --   CREATININE  --   --   --   --   --  0.98    Estimated Creatinine Clearance: 78.8 ml/min (by C-G formula based on Cr of 0.98).   Medications:  Heparin @ 1650 units/hr  Assessment: INR 2.43.  Heparin level = 0.66 on 1650 units/hr in this 73yo male on coumadin /heparin bridge for h/o mechanical AVR.  He is s/p cath on 9/25 with successful PCI of the SVG to RCA.  Heparin level is therapeutic on 1650 units/hr. INR has increased from 1.73 yesterday to 2.43 today which is just below goal 2.5-3.5 for mechanical AVR.  He has received 7.5mg  daily over past 4 days.No bleeding reported. Plavix started 9/26 post PCI per cardiologist as the oral anti-platelet agent. ASA and heparin have been discontinued by cardiology.     PTA coumadin dose was 5mg  daily except 7.5 mg qMon/Fri.  Goal of Therapy:  Heparin level: 0.3 - 0.7 INR 2.5-3.5 Monitor platelets by anticoagulation protocol: Yes   Plan:  Cardiology has discontinued the IV heparin and aspirin.  Coumadin 5 mg po today (goal 2.5-3.5) Daily INR, CBC in AM  Nicole Cella, RPh Clinical Pharmacist Pager: [phone] 05/17/2014 5:23 PM

## 2014-05-17 NOTE — Progress Notes (Signed)
Pt just walked with PT. Anxious to d/c. Discussed NTG with pt and daughter. Also reviewed Plavix and Coumadin. Discussed smoking cessation and buying patches. Voiced understanding. [place], ACSM 12:15 PM 05/17/2014

## 2014-05-17 NOTE — Progress Notes (Deleted)
ANTICOAGULATION CONSULT NOTE - Follow Up Consult  Pharmacy Consult for Heparin and coumadin Indication: mechanical AVR  No Known Allergies  Patient Measurements: Height: 5\' 10"  (177.8 cm) Weight: 216 lb 3.2 oz (98.068 kg) (scale a) IBW/kg (Calculated) : 73 Heparin Dosing Weight: 93kg  Vital Signs: Temp: 97.9 F (36.6 C) (09/29 0425) Temp src: Oral (09/29 0425) BP: 129/50 mmHg (09/29 0947) Pulse Rate: 68 (09/29 0947)  Labs:  Recent Labs  05/15/14 0315 05/16/14 0400 05/16/14 0420 05/16/14 1454 05/17/14 0428 05/17/14 1010  HGB 11.1* 11.1*  --   --  11.1*  --   HCT 33.7* 33.6*  --   --  33.6*  --   PLT 153 170  --   --  178  --   LABPROT 19.9*  --  20.3*  --  26.4*  --   INR 1.69*  --  1.73*  --  2.43*  --   HEPARINUNFRC 0.44  --  0.17* 0.30 0.66  --   CREATININE  --   --   --   --   --  0.98    Estimated Creatinine Clearance: 78.8 ml/min (by C-G formula based on Cr of 0.98).   Assessment: INR 2.43.  Heparin level = 0.66 on 1650 units/hr in this 73yo male on coumadin /heparin bridge for h/o mechanical AVR.  He is s/p cath on 9/25 with successful PCI of the SVG to RCA.  Heparin level is therapeutic on 1650 units/hr. INR has increased from 1.73 yesterday to 2.43 today which is just below goal 2.5-3.5 for mechanical AVR.  He has received 7.5mg  daily over past 4 days.No bleeding reported. Plavix started 9/26 post PCI per cardiologist as the oral anti-platelet agent. ASA and heparin have been discontinued by cardiology.  No bleeding noted.    PTA coumadin dose was 5mg  daily except 7.5 mg qMon/Fri.  Goal of Therapy:  Heparin level: 0.3 - 0.7 INR 2.5-3.5 Monitor platelets by anticoagulation protocol: Yes   Plan:  Cardiology has discontinued the IV heparin and aspirin.  Coumadin 5 mg po today (goal 2.5-3.5) Daily INR, CBC in AM  Nicole Cella, RPh Clinical Pharmacist Pager: [phone] 05/17/2014 5:23 PM

## 2014-05-17 NOTE — Progress Notes (Signed)
ANTICOAGULATION CONSULT NOTE - Follow Up Consult  Pharmacy Consult for Heparin and coumadin Indication: mechanical AVR  No Known Allergies  Patient Measurements: Height: 5\' 10"  (177.8 cm) Weight: 216 lb 3.2 oz (98.068 kg) (scale a) IBW/kg (Calculated) : 73 Heparin Dosing Weight: 93kg  Vital Signs: Temp: 97.9 F (36.6 C) (09/29 0425) Temp src: Oral (09/29 0425) BP: 129/50 mmHg (09/29 0947) Pulse Rate: 68 (09/29 0947)  Labs:  Recent Labs  05/15/14 0315 05/16/14 0400 05/16/14 0420 05/16/14 1454 05/17/14 0428 05/17/14 1010  HGB 11.1* 11.1*  --   --  11.1*  --   HCT 33.7* 33.6*  --   --  33.6*  --   PLT 153 170  --   --  178  --   LABPROT 19.9*  --  20.3*  --  26.4*  --   INR 1.69*  --  1.73*  --  2.43*  --   HEPARINUNFRC 0.44  --  0.17* 0.30 0.66  --   CREATININE  --   --   --   --   --  0.98    Estimated Creatinine Clearance: 78.8 ml/min (by C-G formula based on Cr of 0.98).   Medications:  Heparin @ 1650 units/hr  Assessment: INR 2.43.  Heparin level = 0.66 on 1650 units/hr in this 73yo male on coumadin /heparin bridge for h/o mechanical AVR.  He is s/p cath on 9/25 with successful PCI of the SVG to RCA.  Heparin level is therapeutic on 1650 units/hr. INR has increased from 1.73 yesterday to 2.43 today which is just below goal 2.5-3.5 for mechanical AVR.  He has received 7.5mg  daily over past 4 days.No bleeding reported. Plavix started 9/26 post PCI per cardiologist as the oral anti-platelet agent. ASA and heparin have been discontinued by cardiology.  No bleeding noted.   PTA coumadin dose was 5mg  daily except 7.5 mg qMon/Fri.  Goal of Therapy:  Heparin level: 0.3 - 0.7 INR 2.5-3.5 Monitor platelets by anticoagulation protocol: Yes   Plan:  Cardiology has discontinued the IV heparin and aspirin.  Coumadin 5 mg po today (goal 2.5-3.5) Daily INR, CBC in AM  Nicole Cella, RPh Clinical Pharmacist Pager: [phone] 05/17/2014 5:23 PM

## 2014-05-17 NOTE — Discharge Summary (Addendum)
Discharge Summary   Patient ID: Tanner Pena MRN: [id number], DOB/AGE: 73-Mar-1942 73 y.o. Admit date: 05/07/2014 D/C date:     05/17/2014  Primary Cardiologist: Dr. [name] Dr. [name] (PVD)  Principal Problem:   Unstable angina Active Problems:   Long term (current) use of anticoagulants   H/O aortic valve replacement- St Jude   CAD - CABG '90 with re do '94    Tobacco abuse   PVD prior SFA PTA with chronic LE disease, not amenable to PTA   DM2 (diabetes mellitus, type 2)   Carotid stenosis   Hyperlipidemia with target LDL less than 70   Cardiomyopathy, ischemic - EF 25-30% by echo 5/14   COPD (chronic obstructive pulmonary disease)   LBBB (left bundle branch block)   Warfarin-induced coagulopathy   HTN (hypertension)   PVC's (premature ventricular contractions)   Chronic toe ulcer   Admission Dates: 05/07/14-05/17/14 Discharge Diagnosis: [name] s/p overlapping DES x2 to SVG to RCA.   HPI: Tanner Pena is a 73 y.o. male with a history of CAD s/p CABG '90 with redo '94 & stent to RCA SVG in 2005, AS s/p mechanical AVR on Coumadin, ongoing tobacco abuse, DMT2, carotid stenosis, HLD, COPD, LBBB, HTN, PVD s/p multiple stents and non healing ulcers on left foot who presetned to [facility] on 05/07/14 with chest pain concerning for [name]. He was transferred to [facility] on 05/09/14 for further management.   In 1991 he underwent initial CABG revascularization surgery to his RCA and also underwent St. Jude's aortic valve replacement surgery for aortic valve stenosis. In 1994, on coumadin followed by [facility] clinic, he required redo CABG surgery to his left coronary system which was not bypassed in 1991. In 2005, a stent was placed in his RCA vein graft the patient has significant peripheral vascular disease and is status post intervention to both his right iliac and bilateral SFAs with Dr.Berry and has also undergone rotational atherectomy of his SFAs.  Upon presentation he described approx 1 year  history of intermittent chest. He reported recently a increase in frequency and severity over the last month. He had an episode of chest pain prior to admission rated 9/10 with a burning in his chest radiating to his neck and both arms. He missed his appt with Dr. [name] in July. He denies medical non-compliance but does admit to a good bit of dietary non-compliance. He quit smoking the day he came to ER.  Hospital Course  [name]- Cardiac markers negative. Last cath 2005 with 6 patent bypass grafts with high grade disease in the SVG to RCA treated with a Taxus DES. That procedure was complicated by perforation of the SVG in the area of stent placement.  -- Cath 05/11/14 demonstrated   1. Triple vessel CAD s/p 6V CABG with 6/6 patent grafts   2. Unstable angina secondary to severe restenosis in the stented segment of the SVG to RCA   3. Respiratory failure during the diagnostic cath requiring intubation/mechanical ventilation   4. Hypotension following intubation due to paralytic agents/sedatives.  -- It was recommended that he undergo PCI of the SVG to the RCA, but this was postponed until he became clinically stable. PCCM has assisted in the cath lab with ventilator management. He was diuresed with IV Lasix -- He underwent repeat cath on 05/13/14 for PCI and overlapping DES x2 were placed to the SVG.  -- He was started on ASA and Plavix with plans to discontinued the ASA once coumadin was  therapeutic. His INR is now therapeutic and ASA and heparin have been discontinued.  -- Continue plavix, statin, BB and imdur  Severe AS- s/p St. Jude Mechanical AVR- Echo 9/20>>EF 45-50% w/ mech valve in place. There was moderate stenosis. Valve area (Vmax): 4.01 cm^2.  -- Bridged with heparin and planned for DC home when INR >2. Currently INR 2.43. ASA and heparin have been discontinued today. Okay to go home with coumadin check on Thursday at [facility] in [place].   Cardiomyopathy, ischemic - EF 25-30% by  echo 5/14  -- Patient on home Lasix. Not on any diuretic currently. He did have some SOB when going to the bathroom today and has some mild LE edema. His creatinine is normal currently so we will resume his home Lasix and Altace. Unsure why diuretic therapy was discontinued but likely due to renal insufficiency in the setting of contrast dye exposure with two LHCs on this admission. -- Continue Lasix 40mg , Kdur 20 mEq, ACE and BB.   Tobacco abuse - counseled on cessation. Patient is having significant cravings and would like to try a nicotine patch.   PVD s/p bilateral SFA stenting.  -- Known slowly healing wound on his left first and second toe. He underwent angiography by Dr. [name] on 02/15/2013 revealing patent stents in his SFAs bilaterally, 60-70% segmental popliteal stenosis with one-vessel runoff on the left. His anterior tibial and perineal arteries were occluded and his posterior tibial had tandem 90% stenoses and terminated above the ankle. It was thought that he had inadequate flow to his foot and heel but there was no clear endovascular or surgical solution.  -- He was seen in the office on 02/22/13 and his toe was slowly healing with conservative therapy. It was documented at that time that if there was worsening of his ulcer he would send him to [facility] to have Dr. Brunetta Jeans attempted recanalization of his tibial arteries. The wounds improved since that time in a subsequent office visit, but now have become worse again and he is complaining of considerable pain.  -- Seen by Dr. Kellie Simmering on 05/11/14 who felt he was not at risk for limb loss at that time. He was felt not to be a candidate for revascularization and definitive treatment would entail amputation.  -- Close outpatient follow up with Dr. [name] next week.   DM2- HgA1C 6.8. Continue to follow with PCP.   Carotid stenosis - carotid Doppler study done in 04/2012 showed moderate amount of fibrous plaque and right carotid internal  stenosis of less than 49% both the right external carotid narrowing of 70-99%. In addition he did have elevated velocities in the left subclavian artery suggestive of 50-69% diameter and occlusive disease in the left vertebral artery. His left internal carotid revealed 50-69% of diameter reduction.   Hyperlipidemia with target LDL less than 70  -- Continue statin, lovaza and fenofibrate   LBBB - chronic  Acute respiratory distress during cath- 2/2 flash pulmonary edema requiring intubation. This occurred on 9/23. He was extubated the following day.  -- Questionable aspiration pneumonia. Now resolved.  -- Right basilar infiltrate reported on chest x-ray 9/23  HTN- IV lopressor discontinued yesterday and he was resumed on his home Toprol XL. He was previously on 75mg  but only 50mg  was restarted here. His BP has been well controlled.  -- His home Altace has been resumed as well as his Lasix. Will hold home amlodipine  PVC's- hx of this.   The patient has had  a complicated and protracted hospital course, but is recovering well. The radial and femoral catheter site are stable. He has been seen by Dr. Faythe Casa today and deemed ready for discharge home. All follow-up appointments have been scheduled. Smoking cessation was disscussed in length. Discharge medications are listed below. Patient was very eager to go and left abruptly before med reconciliation was revised. I left a message on his home phone to not resume his home amlodipine as his pressures has been soft to normal. He will resume the medications listed below. Pharmacy recommended that he take 5mg  coumadin today and tomorrow with INR on Thursday. This has been arranged for his regular coumadin laboratory. I provided him with a copy of the requisition to [facility]    Discharge Vitals: Blood pressure 129/50, pulse 68, temperature 97.9 F (36.6 C), temperature source Oral, resp. rate 18, height 5\' 10"  (1.778 m), weight 216 lb 3.2  oz (98.068 kg), SpO2 97.00%.  Labs: Lab Results  Component Value Date   WBC 7.0 05/17/2014   HGB 11.1* 05/17/2014   HCT 33.6* 05/17/2014   MCV 89.8 05/17/2014   PLT 178 05/17/2014     Recent Labs Lab 05/17/14 1010  NA 139  K 4.0  CL 101  CO2 28  BUN 20  CREATININE 0.98  CALCIUM 9.7  GLUCOSE 196*    Lab Results  Component Value Date   CHOL 123 05/09/2014   HDL 29* 05/09/2014   LDLCALC 62 05/09/2014   TRIG 161* 05/09/2014   Lab Results  Component Value Date   DDIMER <0.27 01/19/2014    Diagnostic Studies/Procedures   Cardiac Catheterization Operative Report  9/23/20151:51 PM  FUSCO,LAWRENCE J., MD  Procedure Performed:  1. Left Heart Catheterization 2. Selective Coronary Angiography 3. SVG angiopraphy 4. LIMA graft angiography 5. Placement temporary transvenous pacemaker Operator: Lauree Chandler, MD  Indication: 73 yo male with history of CAD s/p CABG in 1990 with redo bypass surgery in 1994 admitted with unstable angina. Cardiac markers negative. Last cath 2005 with 6 patent bypass grafts with high grade disease in the SVG to RCA treated with a Taxus DES. This procedure was complicated by perforation of the SVG in the area of stent placement.  Procedure Details:  The risks, benefits, complications, treatment options, and expected outcomes were discussed with the patient. The patient and/or family concurred with the proposed plan, giving informed consent. The patient was brought to the cath lab after IV hydration was begun and oral premedication was given. The patient was further sedated with Versed and Fentanyl. The right groin was prepped and draped in the usual manner. Using the modified Seldinger access technique, a 5 French sheath was placed in the right femoral artery. I was unable to manipulate my catheters given his extensive calcification in the iliac system. I then placed a 45 cm 6 French sheath in the right femoral artery. A JL-5 catheter was used to engage the  left main artery. He then had a coughing fit and bradycardia with several 5 seconds periods of complete AV block with no escape. I then placed a 6 French sheath in the right femoral vein. A temporary pacemaker was placed into the RV. He did not require pacing during the remainder of the case. At this time, he became dyspneic and began to have respiratory distress. His oxygen saturations began to drop and at this time he could not protect his airway. Anesthesia was called and he was intubated. I then performed selective angiography  of the RCA, both vein grafts with the JR4 catheter. The LIMA graft was engaged with the IMA catheter. The mechanical aortic valve was not crossed. After intubation followed by the administration of paralytic agents and sedatives, he became hypotensive. Levophed drip was started. He was found to have patent LIMA to mid LAD, patent vein grafts to OM system and severe disease in the vein graft to the RCA.  There were no immediate complications. The patient was taken to the recovery area in stable condition.  Hemodynamic Findings:  Central aortic pressure: 111/42  Angiographic Findings:  Left main: Diffuse 30% stenosis.  Left Anterior Descending Artery: Large caliber vessel that courses to the apex. The proximal vessel has diffuse 40% stenosis. The mid vessel has diffuse 50% stenosis followed by 100% occlusion. The mid and distal vessel fills from the patent IMA graft.  Circumflex Artery: 100% proximal occlusion. There are 4 obtuse marginal branches that fill from the patent vein graft.  Right Coronary Artery: 100% ostial occlusion.  Graft Anatomy:  SVG to RCA has a stent extending from the ostium down into the mid body of the vein graft. The proximal edge of the stent appears to have 70% restenosis with this lesion extending back to the ostium. There are two severe focal stenoses in the stented segment. The mid segment has a 90% stenosis and the distal edge of the stent has a focal  90% stenosis.  SVG sequential to OM1, OM2, OM3, OM4 is patent  LIMA to mid LAD is patent  Left Ventricular Angiogram: Deferred. Aortic valve not crossed due to mechanical valve.  Impression:  1. Triple vessel CAD s/p 6V CABG with 6/6 patent grafts  2. Unstable angina secondary to severe restenosis in the stented segment of the SVG to RCA  3. Respiratory failure during the diagnostic cath requiring intubation/mechanical ventilation  4. Hypotension following intubation due to paralytic agents/sedatives.  Recommendations: His vein graft to the RCA will require PCI with coverage of the entire stented segment of the vein graft from the ostium down into the mid body of the vein graft. Given his respiratory failure, presumably from volume overload, and hypotension with sedative medications as well as inability to give oral anti-platelet agents, will delay PCI of the SVG to the RCA. PCCM has assisted in the cath lab with ventilator management. Hopefully can extubate in am and then be loaded with oral anti-platelet agents. PCI of the SVG to RCA can be planned when he is extubated and hemodynamically stable following anti-platelet loading. Would start IV heparin 8 hours post sheath pull. With need for long term coumadin for mechanical AVR, would use Plavix as the oral anti-platelet agent.  Complications: Respiratory failure due to presumed volume overload, hypotension due to sedative agents following intubation. No cardiac complications.    05/13/14 PROCEDURE: PCI SVG with distal embolic protection  INDICATIONS: Unstable angina  The risks, benefits, and details of the procedure were explained to the patient. The patient verbalized understanding and wanted to proceed. Informed written consent was obtained.  PROCEDURE TECHNIQUE: After Xylocaine anesthesia, right femoral access was attempted but unsuccessful. After topical anesthesia, the left radial arterial line was switched out for a 6 French slender sheath  over a wire, after the area was prepped and draped sterilely. SVG to Right coronary artery angiography was done using an RCB guide catheter. Initially, a pro-water wire was advanced but could not navigate the severe disease in the proximal vessel. A Fielder XT wire was then placed across the  area disease in the proximal vein graft. We attempted to advance a 4 mm spider distal embolic protection device but this would not advance. A 2.0 x 20 balloon was used to predilate the area. After multiple pre-dilatations, the spider distal embolic protection device was successfully advanced to the distal vein graft. A 2.5 balloon was then used to predilate. There is significant debris in the basket. We attempted to advance a 3.0 x 38 stent but would not navigate the proximal disease. We again predilated the very proximal graft with a 2.5. The 3.0 stent would not go even with a pro-water as a buddy wire. A 2.75 x 15 Alpine drug-eluting stent was then advanced and deployed in the mid vein graft. A 2.75 x 23 was then advanced and deployed overlapping the proximal edge of the other Alpine drug-eluting stent. A 2.75 x 28 Alpine drug-eluting stent was then extended to the vessel ostium and deployed. The entire stented segment was post dilated with a 3.5 noncompliant balloon to high pressure. There was an excellent angiographic result. There not appear to be any significant residual stenosis.  CONTRAST: Total of 100 cc.  COMPLICATIONS: None.  ANGIOGRAPHIC DATA:  The SVG to right coronary artery is heavily diseased and full of debris in the proximal portion. See above for PCI NARRATIVE:  IMPRESSIONS:  6. Successful PCI of the SVG to right coronary artery. A 2.75 x 15, 2.75 x 23 and 2.7 x 28 were placed in overlapping fashion from the proximal to mid graft. The entire stented segment was post dilated up to 3.6 mm in diameter. Distal embolic protection was used with successful trapping of debris. RECOMMENDATION: We'll start  aspirin and Plavix at this point. Once his INR is therapeutic, I would stop the aspirin and leave him on Coumadin and Plavix.  Continue aggressive medical therapy. He needs aggressive secondary prevention. Followup with Dr. [name].    Dg Chest Port 1 View  05/12/2014 CLINICAL DATA: Hypoxia EXAM: PORTABLE CHEST - 1 VIEW COMPARISON: May 11, 2014 FINDINGS: Endotracheal tube tip is 4.4 cm above the carina. Nasogastric tube tip and side port are below the diaphragm. No pneumothorax. There is patchy consolidation in the left base, stable. Lungs elsewhere clear. Heart is mildly enlarged with pulmonary vascularity within normal limits. IMPRESSION: Tube positions as described without pneumothorax. Persistent patchy consolidation left base. No new opacity. Cardiomegaly present.   Dg Chest Port 1 View  05/11/2014 CLINICAL DATA: Intubation, endotracheal tube placement, history COPD, diabetes, hypertension, AVR EXAM: PORTABLE CHEST - 1 VIEW COMPARISON: Portable exam 1649 hr compared to 05/07/2014 FINDINGS: Tip of endotracheal tube projects 4.1 cm above carinal. Nasogastric tube extends into stomach. Enlargement of cardiac silhouette with pulmonary vascular congestion. Atherosclerotic calcification aorta. Question RIGHT jugular central venous catheter tip projecting over confluence with innominate vein. Atelectasis versus consolidation in LEFT lower lobe. Mild RIGHT basilar infiltrate. Biapical scarring. No pleural effusion or pneumothorax. IMPRESSION: Enlargement of cardiac silhouette with pulmonary vascular congestion post median sternotomy, CABG gait AVR. Persistent atelectasis versus consolidation in LEFT lower lobe with mild RIGHT basilar infiltrate.   Dg Chest Port 1 View  05/07/2014 CLINICAL DATA: Worsening chest pain and shortness breath EXAM: PORTABLE CHEST - 1 VIEW COMPARISON: None. FINDINGS: Sternotomy wires overlie the stable enlarged cardiac silhouette. There is chronic bronchitic markings and mild  central venous congestion. Left basilar atelectasis similar prior. IMPRESSION: 1. No interval change. 2. Cardiomegaly, central venous congestion and left basilar atelectasis.    Transthoracic Echocardiography LV EF: 45% - 50%  Study Conclusions - Left ventricle: The cavity size was at the upper limits of normal. Wall thickness was increased in a pattern of moderate LVH. Systolic function was mildly reduced. The estimated ejection fraction was in the range of 45% to 50%. Left ventricular diastolic function parameters were normal. - Aortic valve: A mechanical prosthesis was present. There was moderate stenosis. Valve area (Vmax): 4.01 cm^2. - Left atrium: The atrium was moderately dilated. - Right atrium: The atrium was moderately dilated.   Discharge Medications     Medication List    STOP taking these medications       amLODipine 5 MG tablet  Commonly known as:  NORVASC     aspirin EC 81 MG tablet      TAKE these medications       albuterol 4 MG 12 hr tablet  Commonly known as:  VOSPIRE ER  Take 4 mg by mouth 2 (two) times daily.     albuterol-ipratropium 18-103 MCG/ACT inhaler  Commonly known as:  COMBIVENT  Inhale 1 puff into the lungs 4 (four) times daily. Coughing/ Shortness of Breath     allopurinol 300 MG tablet  Commonly known as:  ZYLOPRIM  Take 300 mg by mouth at bedtime.     clopidogrel 75 MG tablet  Commonly known as:  PLAVIX  Take 1 tablet (75 mg total) by mouth daily with breakfast.     fenofibrate 145 MG tablet  Commonly known as:  TRICOR  Take 1 tablet (145 mg total) by mouth daily.     fish oil-omega-3 fatty acids 1000 MG capsule  Take 1 capsule (1 g total) by mouth 2 (two) times daily.     furosemide 80 MG tablet  Commonly known as:  LASIX  Take 40 mg by mouth daily.     glimepiride 1 MG tablet  Commonly known as:  AMARYL  Take 1 mg by mouth 2 (two) times daily.     isosorbide mononitrate 30 MG 24 hr tablet  Commonly known as:  IMDUR    Take 1 tablet (30 mg total) by mouth daily.     metoprolol succinate 50 MG 24 hr tablet  Commonly known as:  TOPROL-XL  Take 1 tablet (50 mg total) by mouth daily. Take with or immediately following a meal.     nicotine 14 mg/24hr patch  Commonly known as:  NICODERM CQ - dosed in mg/24 hours  Place 1 patch (14 mg total) onto the skin daily.     nitroGLYCERIN 0.4 MG SL tablet  Commonly known as:  NITROSTAT  Place 1 tablet (0.4 mg total) under the tongue every 5 (five) minutes as needed for chest pain.     oxyCODONE-acetaminophen 10-325 MG per tablet  Commonly known as:  PERCOCET  Take 1 tablet by mouth every 6 (six) hours as needed for pain.     potassium chloride SA 20 MEQ tablet  Commonly known as:  K-DUR,KLOR-CON  Take 20 mEq by mouth daily.     pregabalin 50 MG capsule  Commonly known as:  LYRICA  Take 50 mg by mouth 2 (two) times daily.     ramipril 5 MG capsule  Commonly known as:  ALTACE  Take 2.5-5 mg by mouth 2 (two) times daily. Take 1 tablet in the morning and 1/2 tablet at bedtime.     rosuvastatin 20 MG tablet  Commonly known as:  CRESTOR  Take 20 mg by mouth at bedtime.     sitaGLIPtin 50 MG tablet  Commonly known as:  JANUVIA  Take 50 mg by mouth daily.     warfarin 5 MG tablet  Commonly known as:  COUMADIN  Take 1-1.5 tablets (5-7.5 mg total) by mouth at bedtime. Takes one and one-half tablet (7.5mg  total) on Mondays and Fridays. Takes one tablet (5mg  total) on all other days        Disposition   The patient will be discharged in stable condition to home.  Follow-up Information   Follow up with [name], MD On 05/27/2014. (@ 11am. )    Specialty:  Cardiology   Contact information:   [address] [place] [address] [phone]       Follow up with Isaiah Serge, NP On 06/01/2014. (@ 3pm )    Specialty:  Cardiology   Contact information:   [address] [place] [address] [phone]        Follow up with [facility] On 05/19/2014. (Please get your coumadin checked at your normal lab on thursday)         Duration of Discharge Encounter: Greater than 30 minutes including physician and PA time.  SignedGrandville Silos, Jillyn Stacey R PA-C 05/17/2014, 2:52 PM.

## 2014-05-17 NOTE — Progress Notes (Signed)
Patient Name: Tanner Pena Date of Encounter: 05/17/2014     Principal Problem:   Unstable angina Active Problems:   Long term (current) use of anticoagulants   H/O aortic valve replacement- [employer] Jude   CAD - CABG '90 with re do '94    Tobacco abuse   PVD prior SFA PTA with chronic LE disease, not amenable to PTA   DM2 (diabetes mellitus, type 2)   Carotid stenosis   Hyperlipidemia with target LDL less than 70   Chest pain on admission MI r/o- Myoview negative 08/13/13   Cardiomyopathy, ischemic - EF 25-30% by echo 5/14   COPD (chronic obstructive pulmonary disease)   LBBB (left bundle branch block)   Warfarin-induced coagulopathy   HTN (hypertension)   PVC's (premature ventricular contractions)    SUBJECTIVE   He did have some SOB when going to the bathroom today and has some LE edema. A lot of pain in his left toe ulcer. The sheets even hurt his toe. Otherwise wants to go home.   CURRENT MEDS . allopurinol  300 mg Oral QHS  . ampicillin-sulbactam (UNASYN) IV  3 g Intravenous Q8H  . aspirin  81 mg Oral Daily  . atorvastatin  80 mg Oral q1800  . clopidogrel  75 mg Oral Q breakfast  . fenofibrate  160 mg Oral Daily  . insulin aspart  0-15 Units Subcutaneous TID WC  . metoprolol succinate  50 mg Oral Daily  . omega-3 acid ethyl esters  1 g Oral BID  . pantoprazole  40 mg Oral Daily  . povidone-iodine   Topical Q1200  . pregabalin  50 mg Oral QHS  . Warfarin - Pharmacist Dosing Inpatient   Does not apply q1800    OBJECTIVE  Filed Vitals:   05/16/14 1602 05/16/14 2054 05/17/14 0250 05/17/14 0425  BP: 143/47 122/52 116/64 132/73  Pulse: 62 62 62 58  Temp:  98 F (36.7 C) 97.8 F (36.6 C) 97.9 F (36.6 C)  TempSrc:  Axillary Oral Oral  Resp:  18 16 18   Height:      Weight:    216 lb 3.2 oz (98.068 kg)  SpO2:  100% 99% 99%    Intake/Output Summary (Last 24 hours) at 05/17/14 0850 Last data filed at 05/17/14 0830  Gross per 24 hour  Intake   2780 ml  Output     551 ml  Net   2229 ml   Filed Weights   05/15/14 1308 05/16/14 0738 05/17/14 0425  Weight: 213 lb 3 oz (96.7 kg) 213 lb 1.6 oz (96.662 kg) 216 lb 3.2 oz (98.068 kg)    PHYSICAL EXAM  Well developed and nourished in no acute distress  HENT normal  Neck supple with JVP-flat  Clear  Regular rate and rhythm, 2/6 murmur mechanical s2  Abd-soft with active BS  No Clubbing cyanosis L>R 1+edema  Skin-warm and dry. Wounds on left foot #1 and #2 metatarsals. Slight erythema. Covered in betadine. 1+ pitting edema in BLE. A & Oriented Grossly normal sensory and motor function   Accessory Clinical Findings  CBC  Recent Labs  05/16/14 0400 05/17/14 0428  WBC 6.7 7.0  HGB 11.1* 11.1*  HCT 33.6* 33.6*  MCV 90.1 89.8  PLT 170 178    TELE  NSR with freq PVCs, chronic LBBB    Radiology/Studies   Cardiac Catheterization Operative Report  Tanner Pena  [id number]  9/23/20151:51 PM  [name], MD  Procedure Performed:  1. Left  Heart Catheterization 2. Selective Coronary Angiography 3. SVG angiopraphy 4. LIMA graft angiography 5. Placement temporary transvenous pacemaker Operator: Lauree Chandler, MD  Indication: 73 yo male with history of CAD s/p CABG in 1990 with redo bypass surgery in 1994 admitted with unstable angina. Cardiac markers negative. Last cath 2005 with 6 patent bypass grafts with high grade disease in the SVG to RCA treated with a Taxus DES. This procedure was complicated by perforation of the SVG in the area of stent placement.  Procedure Details:  The risks, benefits, complications, treatment options, and expected outcomes were discussed with the patient. The patient and/or family concurred with the proposed plan, giving informed consent. The patient was brought to the cath lab after IV hydration was begun and oral premedication was given. The patient was further sedated with Versed and Fentanyl. The right groin was prepped and draped in the  usual manner. Using the modified Seldinger access technique, a 5 French sheath was placed in the right femoral artery. I was unable to manipulate my catheters given his extensive calcification in the iliac system. I then placed a 45 cm 6 French sheath in the right femoral artery. A JL-5 catheter was used to engage the left main artery. He then had a coughing fit and bradycardia with several 5 seconds periods of complete AV block with no escape. I then placed a 6 French sheath in the right femoral vein. A temporary pacemaker was placed into the RV. He did not require pacing during the remainder of the case. At this time, he became dyspneic and began to have respiratory distress. His oxygen saturations began to drop and at this time he could not protect his airway. Anesthesia was called and he was intubated. I then performed selective angiography of the RCA, both vein grafts with the JR4 catheter. The LIMA graft was engaged with the IMA catheter. The mechanical aortic valve was not crossed. After intubation followed by the administration of paralytic agents and sedatives, he became hypotensive. Levophed drip was started. He was found to have patent LIMA to mid LAD, patent vein grafts to OM system and severe disease in the vein graft to the RCA.  There were no immediate complications. The patient was taken to the recovery area in stable condition.  Hemodynamic Findings:  Central aortic pressure: 111/42  Angiographic Findings:  Left main: Diffuse 30% stenosis.  Left Anterior Descending Artery: Large caliber vessel that courses to the apex. The proximal vessel has diffuse 40% stenosis. The mid vessel has diffuse 50% stenosis followed by 100% occlusion. The mid and distal vessel fills from the patent IMA graft.  Circumflex Artery: 100% proximal occlusion. There are 4 obtuse marginal branches that fill from the patent vein graft.  Right Coronary Artery: 100% ostial occlusion.  Graft Anatomy:  SVG to RCA has a  stent extending from the ostium down into the mid body of the vein graft. The proximal edge of the stent appears to have 70% restenosis with this lesion extending back to the ostium. There are two severe focal stenoses in the stented segment. The mid segment has a 90% stenosis and the distal edge of the stent has a focal 90% stenosis.  SVG sequential to OM1, OM2, OM3, OM4 is patent  LIMA to mid LAD is patent  Left Ventricular Angiogram: Deferred. Aortic valve not crossed due to mechanical valve.  Impression:  1. Triple vessel CAD s/p 6V CABG with 6/6 patent grafts  2. Unstable angina secondary to severe restenosis in the  stented segment of the SVG to RCA  3. Respiratory failure during the diagnostic cath requiring intubation/mechanical ventilation  4. Hypotension following intubation due to paralytic agents/sedatives.  Recommendations: His vein graft to the RCA will require PCI with coverage of the entire stented segment of the vein graft from the ostium down into the mid body of the vein graft. Given his respiratory failure, presumably from volume overload, and hypotension with sedative medications as well as inability to give oral anti-platelet agents, will delay PCI of the SVG to the RCA. PCCM has assisted in the cath lab with ventilator management. Hopefully can extubate in am and then be loaded with oral anti-platelet agents. PCI of the SVG to RCA can be planned when he is extubated and hemodynamically stable following anti-platelet loading. Would start IV heparin 8 hours post sheath pull. With need for long term coumadin for mechanical AVR, would use Plavix as the oral anti-platelet agent.  Complications: Respiratory failure due to presumed volume overload, hypotension due to sedative agents following intubation. No cardiac complications.    Dg Chest Port 1 View  05/12/2014   CLINICAL DATA:  Hypoxia  EXAM: PORTABLE CHEST - 1 VIEW  COMPARISON:  May 11, 2014  FINDINGS: Endotracheal tube tip  is 4.4 cm above the carina. Nasogastric tube tip and side port are below the diaphragm. No pneumothorax. There is patchy consolidation in the left base, stable. Lungs elsewhere clear. Heart is mildly enlarged with pulmonary vascularity within normal limits.  IMPRESSION: Tube positions as described without pneumothorax. Persistent patchy consolidation left base. No new opacity. Cardiomegaly present.     Dg Chest Port 1 View  05/11/2014   CLINICAL DATA:  Intubation, endotracheal tube placement, history COPD, diabetes, hypertension, AVR  EXAM: PORTABLE CHEST - 1 VIEW  COMPARISON:  Portable exam 1649 hr compared to 05/07/2014  FINDINGS: Tip of endotracheal tube projects 4.1 cm above carinal.  Nasogastric tube extends into stomach.  Enlargement of cardiac silhouette with pulmonary vascular congestion.  Atherosclerotic calcification aorta.  Question RIGHT jugular central venous catheter tip projecting over confluence with innominate vein.  Atelectasis versus consolidation in LEFT lower lobe.  Mild RIGHT basilar infiltrate.  Biapical scarring.  No pleural effusion or pneumothorax.  IMPRESSION: Enlargement of cardiac silhouette with pulmonary vascular congestion post median sternotomy, CABG gait AVR.  Persistent atelectasis versus consolidation in LEFT lower lobe with mild RIGHT basilar infiltrate.    Dg Chest Port 1 View  05/07/2014   CLINICAL DATA:  Worsening chest pain and shortness breath  EXAM: PORTABLE CHEST - 1 VIEW  COMPARISON:  None.  FINDINGS: Sternotomy wires overlie the stable enlarged cardiac silhouette. There is chronic bronchitic markings and mild central venous congestion. Left basilar atelectasis similar prior.  IMPRESSION: 1. No interval change. 2. Cardiomegaly, central venous congestion and left basilar atelectasis.     Transthoracic Echocardiography LV EF: 45% - 50% Study Conclusions - Left ventricle: The cavity size was at the upper limits of normal. Wall thickness was increased in a pattern  of moderate LVH. Systolic function was mildly reduced. The estimated ejection fraction was in the range of 45% to 50%. Left ventricular diastolic function parameters were normal. - Aortic valve: A mechanical prosthesis was present. There was moderate stenosis. Valve area (Vmax): 4.01 cm^2. - Left atrium: The atrium was moderately dilated. - Right atrium: The atrium was moderately dilated.    ASSESSMENT AND PLAN  Tanner Pena is a 73 y.o. male with a history of CAD s/p CABG '90 with  re do '94, tobacco abuse, DMT2, carotid stenosis, HLD, COPD, LBBB, HTN, PVD s/p multiple stents and mechanical AVR who was admitted on 05/07/14 with [name].   [name]- Cardiac markers negative. Last cath 2005 with 6 patent bypass grafts with high grade disease in the SVG to RCA treated with a Taxus DES. That procedure was complicated by perforation of the SVG in the area of stent placement.  -- Cath 05/11/14 demonstrated   1. Triple vessel CAD s/p 6V CABG with 6/6 patent grafts   2. Unstable angina secondary to severe restenosis in the stented segment of the SVG to RCA   3. Respiratory failure during the diagnostic cath requiring intubation/mechanical ventilation   4. Hypotension following intubation due to paralytic agents/sedatives.  -- It was recommended that he undergo PCI of the SVG to the RCA, but this was postponed until he became clinically stable. PCCM has assisted in the cath lab with ventilator management.  -- He underwent repeat cath on 05/13/14 for PCI and overlapping DES x2 were placed to the SVG. -- He was started on ASA and Plavix with plans to discontinued the ASA once coumadin therapeutic. His INR is now therapeutic and ASA and heparin have been discontinued.   Severe AS- s/p St. Jude Mechanical AVR- Echo 9/20>>EF 45-50% w mech valve in place. There was moderate stenosis. Valve area (Vmax): 4.01 cm^2. -- Bridged with heparin and panned for DC home when INR >2. Currently INR 2.43 and may be able to go  home today. ASA and heparin have been discontinued today.   Cardiomyopathy, ischemic - EF 25-30% by echo 5/14  -- Patient on home Lasix. Not on any diuretic currently. He did have some SOB when going to the bathroom today and has some LE edema. Consider adding back his lasix today. BMET today pending.   Tobacco abuse - counseled on cessation. Patient is having significant cravings and would like to try a nicotine patch.   PVD s/p bilateral SFA stenting. -- Known slowly healing wound on his left first and second toe. He underwent angiography by Dr. [name] on 02/15/2013 revealing patent stents in his SFAs bilaterally, 60-70% segmental popliteal stenosis with one-vessel runoff on the left. His anterior tibial and perineal arteries were occluded and his posterior tibial had tandem 90% stenoses and terminated above the ankle. It was thought that he had inadequate flow to his foot and heel but there was no clear endovascular or surgical solution.  -- He was seen in the office on 02/22/13 and his toe was slowly healing with conservative therapy. It was documented at that time that if there was worsening of his ulcer he would send him to [facility] to have Dr. Brunetta Jeans attempted recanalization of his tibial arteries. The wounds improved since that time in a subsequent office visit, but now have become worse again and he is complaining of considerable pain.  -- Seen by Dr. Kellie Simmering on 05/11/14 who felt he was not at risk for limb loss at that time. He was felt not to be a candidate for revascularization and definitive treatment would entail amputation. However, at that time the foot was not painful.  -- Close outpatient follow up with Dr. [name] if medically cleared to go home today. MD to see.   DM2- HgA1C 6.8. Continue to follow with PCP.   Carotid stenosis    Hyperlipidemia with target LDL less than 70  -- Continue statin, lovaza and fenofibrate   LBBB   HTN- IV lopressor discontinued yesterday  and he was  resumed on his home dose of Toprol XL 50mg . BP well controlled.   PVC's- hx of this.    Judy Pimple PA-C  Pager [phone]

## 2014-05-17 NOTE — Discharge Instructions (Addendum)
Resume your home coumadin dosing taking 5mg  today and tomorrow with an INR check on Thursday at your preferred laboratory.      Information on my medicine - Coumadin   (Warfarin)  This medication education was reviewed with me or my healthcare representative as part of my discharge preparation.  The pharmacist that spoke with me during my hospital stay was:  Arman Bogus, [facility]  Why was Coumadin prescribed for you? Coumadin was prescribed for you because you have a blood clot or a medical condition that can cause an increased risk of forming blood clots. Blood clots can cause serious health problems by blocking the flow of blood to the heart, lung, or brain. Coumadin can prevent harmful blood clots from forming. As a reminder your indication for Coumadin is:   Select from menu  What test will check on my response to Coumadin? While on Coumadin (warfarin) you will need to have an INR test regularly to ensure that your dose is keeping you in the desired range. The INR (international normalized ratio) number is calculated from the result of the laboratory test called prothrombin time (PT).  If an INR APPOINTMENT HAS NOT ALREADY BEEN MADE FOR YOU please schedule an appointment to have this lab work done by your health care provider within 7 days. Your INR goal is usually a number between:  2 to 3 or your provider may give you a more narrow range like 2-2.5.  Ask your health care provider during an office visit what your goal INR is.  What  do you need to  know  About  COUMADIN? Take Coumadin (warfarin) exactly as prescribed by your healthcare provider about the same time each day.  DO NOT stop taking without talking to the doctor who prescribed the medication.  Stopping without other blood clot prevention medication to take the place of Coumadin may increase your risk of developing a new clot or stroke.  Get refills before you run out.  What do you do if you miss a dose? If you miss a dose,  take it as soon as you remember on the same day then continue your regularly scheduled regimen the next day.  Do not take two doses of Coumadin at the same time.  Important Safety Information A possible side effect of Coumadin (Warfarin) is an increased risk of bleeding. You should call your healthcare provider right away if you experience any of the following:   Bleeding from an injury or your nose that does not stop.   Unusual colored urine (red or dark brown) or unusual colored stools (red or black).   Unusual bruising for unknown reasons.   A serious fall or if you hit your head (even if there is no bleeding).  Some foods or medicines interact with Coumadin (warfarin) and might alter your response to warfarin. To help avoid this:   Eat a balanced diet, maintaining a consistent amount of Vitamin K.   Notify your provider about major diet changes you plan to make.   Avoid alcohol or limit your intake to 1 drink for women and 2 drinks for men per day. (1 drink is 5 oz. wine, 12 oz. beer, or 1.5 oz. liquor.)  Make sure that ANY health care provider who prescribes medication for you knows that you are taking Coumadin (warfarin).  Also make sure the healthcare provider who is monitoring your Coumadin knows when you have started a new medication including herbals and non-prescription products.  Coumadin (Warfarin)  Major Drug Interactions  Increased Warfarin Effect Decreased Warfarin Effect  Alcohol (large quantities) Antibiotics (esp. Septra/Bactrim, Flagyl, Cipro) Amiodarone (Cordarone) Aspirin (ASA) Cimetidine (Tagamet) Megestrol (Megace) NSAIDs (ibuprofen, naproxen, etc.) Piroxicam (Feldene) Propafenone (Rythmol SR) Propranolol (Inderal) Isoniazid (INH) Posaconazole (Noxafil) Barbiturates (Phenobarbital) Carbamazepine (Tegretol) Chlordiazepoxide (Librium) Cholestyramine (Questran) Griseofulvin Oral Contraceptives Rifampin Sucralfate (Carafate) Vitamin K   Coumadin  (Warfarin) Major Herbal Interactions  Increased Warfarin Effect Decreased Warfarin Effect  Garlic Ginseng Ginkgo biloba Coenzyme Q10 Green tea St. Johns wort    Coumadin (Warfarin) FOOD Interactions  Eat a consistent number of servings per week of foods HIGH in Vitamin K (1 serving =  cup)  Collards (cooked, or boiled & drained) Kale (cooked, or boiled & drained) Mustard greens (cooked, or boiled & drained) Parsley *serving size only =  cup Spinach (cooked, or boiled & drained) Swiss chard (cooked, or boiled & drained) Turnip greens (cooked, or boiled & drained)  Eat a consistent number of servings per week of foods MEDIUM-HIGH in Vitamin K (1 serving = 1 cup)  Asparagus (cooked, or boiled & drained) Broccoli (cooked, boiled & drained, or raw & chopped) Brussel sprouts (cooked, or boiled & drained) *serving size only =  cup Lettuce, raw (green leaf, endive, romaine) Spinach, raw Turnip greens, raw & chopped   These websites have more information on Coumadin (warfarin):  FailFactory.se; VeganReport.com.au;

## 2014-05-17 NOTE — Progress Notes (Signed)
Patient discharged to home, transported by daughter.  Patient adamant about leaving "right now" because his daughter had to pick up her children from school.  IVs removed prior to discharge; IV sites clean, dry, and intact.  Discharge instructions, education, and medications discussed with patient and daughter prior to discharge; patient and daughter voiced understanding of information presented.

## 2014-05-17 NOTE — Evaluation (Signed)
Physical Therapy Evaluation Patient Details Name: Tanner Pena MRN: [id number] DOB: [birth year] Today's Date: 05/17/2014   History of Present Illness  Adm 05/07/14 with unstable angina. During cardiac cath, developed respiratory failure and intubated. Required second catheterization for 2 stents with pt ventilated for procedure.  Pt has had incr pain in Lt toes (has h/o PVD with ulcers on toes that vascular has been following).  PMHx-DM, PVD, AVR, CABG, cardiomyopathy with EF 25-30%  Clinical Impression  Patient evaluated by Physical Therapy with no further acute PT needs identified. All education has been completed and the patient has no further questions.  PT is signing off. Thank you for this referral.     Follow Up Recommendations No PT follow up    Equipment Recommendations  None recommended by PT    Recommendations for Other Services       Precautions / Restrictions Restrictions Weight Bearing Restrictions: No      Mobility  Bed Mobility Overal bed mobility: Independent                Transfers Overall transfer level: Independent Equipment used: None                Ambulation/Gait Ambulation/Gait assistance: Supervision Ambulation Distance (Feet): 120 Feet Assistive device: Rolling walker (2 wheeled);None Gait Pattern/deviations: Step-through pattern;Decreased stride length Gait velocity: minimally decr   General Gait Details: Pt using Lt post-op shoe (has used for years); slightly antalgic gait and agreed to try RW. Educated on proper use, however pt continued with antalgic pattern and became flexed through trunk. Overall, was more steady with more normalized gait without RW.  Stairs            Wheelchair Mobility    Modified Rankin (Stroke Patients Only)       Balance Overall balance assessment:  (slightly antalgic, however no overt loss of balance)                                           Pertinent Vitals/Pain  Pain Assessment: 0-10 Pain Score: 7  Pain Location: Lt toes Pain Intervention(s): Limited activity within patient's tolerance;Monitored during session    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  He report no history of falls (except tripping over hidden branches in the woods), and reports at baseline he has very limited walking distance.       Hand Dominance        Extremity/Trunk Assessment   Upper Extremity Assessment: Overall WFL for tasks assessed           Lower Extremity Assessment: Overall WFL for tasks assessed      Cervical / Trunk Assessment: Normal  Communication   Communication: No difficulties  Cognition Arousal/Alertness: Awake/alert Behavior During Therapy: WFL for tasks assessed/performed Overall Cognitive Status: Within Functional Limits for tasks assessed                      General Comments  General comments (skin integrity, edema, etc.): Daughter present throughout session. Agrees with decision for no RW.    Exercises        Assessment/Plan    PT Assessment Patent does not need any further PT services  PT Diagnosis     PT Problem List    PT Treatment Interventions     PT Goals (Current goals can be found in the Care Plan section) Acute Rehab PT Goals PT Goal Formulation: No goals set, d/c therapy    Frequency     Barriers to discharge        Co-evaluation               End of Session Equipment Utilized During Treatment:  (Lt postop shoe) Activity Tolerance:  Patient tolerated treatment well (reported foot hurt less when walking) Patient left: in chair;with call bell/phone within reach;with family/visitor present Nurse Communication: Mobility status (OK for d/c from a mobility perspective)         Time: 0923-3007 PT Time Calculation (min): 12 min   Charges:   PT Evaluation $Initial PT Evaluation Tier I: 1 Procedure     PT G Codes:          Cassidee Deats 2014-06-03, 12:12 PM Pager [phone]

## 2014-05-18 ENCOUNTER — Telehealth: Payer: Self-pay | Admitting: Cardiovascular Disease

## 2014-05-18 NOTE — Telephone Encounter (Signed)
Spoke to patient  RN asked if patient could come into the office today. Patient states he is unable come today. Appointment 05/25/14 10:15 am - time okayed by Dr [name]

## 2014-05-18 NOTE — Telephone Encounter (Signed)
I probably don't have any time I scheduled her for October 9 but he continued level provider if you'd like

## 2014-05-18 NOTE — Telephone Encounter (Signed)
Tanner Pena is calling because her father toenail is now with red/greensih color and the entire toe is red and wants to know if he should come in earlier .[name] Please call   Thanks

## 2014-05-18 NOTE — Telephone Encounter (Signed)
Spoke to daughter - she states his toe is red and radiating upperwards  She states her father's 2nd toe on the left foot looks worse after last hospitalization-( cardiac cath 05/11/14- not related )   Patient states not hurting at present, he is placing betadine on area. He states it was not done while in hospital.  Patient has 2 ulcers there -,he has an appointment 05/27/14 w/Dr [name] for evaluate and 06/01/14 post hosp.   They wanted to know if patient should come in sooner?

## 2014-05-18 NOTE — Progress Notes (Signed)
I think he can be discharged.

## 2014-05-18 NOTE — Discharge Summary (Signed)
Patient states that his lower extremity/toe discomfort is unchanged. He desires to be discharged. He has not had chest discomfort.  The above note and discharge plans was performed under my supervision and are accurate.

## 2014-05-19 ENCOUNTER — Other Ambulatory Visit: Payer: Self-pay | Admitting: Pharmacist Clinician (PhC)/ Clinical Pharmacy Specialist

## 2014-05-19 NOTE — Discharge Summary (Signed)
Please see the attached note

## 2014-05-20 ENCOUNTER — Ambulatory Visit (INDEPENDENT_AMBULATORY_CARE_PROVIDER_SITE_OTHER): Payer: PRIVATE HEALTH INSURANCE | Admitting: Pharmacist Clinician (PhC)/ Clinical Pharmacy Specialist

## 2014-05-20 DIAGNOSIS — Z954 Presence of other heart-valve replacement: Secondary | ICD-10-CM

## 2014-05-20 DIAGNOSIS — Z952 Presence of prosthetic heart valve: Secondary | ICD-10-CM

## 2014-05-20 LAB — PROTIME-INR
INR: 2.29 — ABNORMAL HIGH (ref <1.50)
PROTHROMBIN TIME: 25.2 s — AB (ref 11.6–15.2)

## 2014-05-25 ENCOUNTER — Encounter: Payer: Self-pay | Admitting: Cardiovascular Disease

## 2014-05-25 ENCOUNTER — Ambulatory Visit (INDEPENDENT_AMBULATORY_CARE_PROVIDER_SITE_OTHER): Payer: PRIVATE HEALTH INSURANCE | Admitting: Cardiovascular Disease

## 2014-05-25 VITALS — BP 140/58 | HR 60 | Ht 70.5 in | Wt 219.0 lb

## 2014-05-25 DIAGNOSIS — I998 Other disorder of circulatory system: Secondary | ICD-10-CM

## 2014-05-25 DIAGNOSIS — D689 Coagulation defect, unspecified: Secondary | ICD-10-CM

## 2014-05-25 DIAGNOSIS — I70229 Atherosclerosis of native arteries of extremities with rest pain, unspecified extremity: Secondary | ICD-10-CM

## 2014-05-25 DIAGNOSIS — R5383 Other fatigue: Secondary | ICD-10-CM

## 2014-05-25 DIAGNOSIS — I2510 Atherosclerotic heart disease of native coronary artery without angina pectoris: Secondary | ICD-10-CM

## 2014-05-25 DIAGNOSIS — Z79899 Other long term (current) drug therapy: Secondary | ICD-10-CM

## 2014-05-25 DIAGNOSIS — I739 Peripheral vascular disease, unspecified: Secondary | ICD-10-CM

## 2014-05-25 NOTE — Assessment & Plan Note (Signed)
Mr. Vandevoorde has had stenting of his SFAs in the past bilaterally. He's also had diamondback orbital rotational atherectomy of his right external iliac artery. I less performed angiography on him 01/19/13 revealing patent iliac and SFA stents with moderate calcified disease in his distal SFA pump adductor canal with severe infrapopliteal disease. His anterior tibial and peroneal occluded and the posterior tibial diffusely diseased. 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 His primary care physician put him on cephalexin. I'm going to recheck Dopplers arrange for him to undergo angiography and potential intervention for limb salvage. He is on Coumadin for a St. Jude aortic valve which will be held for several days prior to his angiogram.

## 2014-05-25 NOTE — Progress Notes (Addendum)
05/25/2014 Tanner Pena   1941/03/20  [id number]  Primary Physician [name], MD Primary Cardiologist: [name] MD Renae Gloss   HPI:  The patient is a 73 year old, moderately overweight, married, Caucasian male father of 33, grandfather to 8 grandchildren. The patient is a cardiology patient of Dr. Lenise Arena who I follow for peripheral vascular disease. I last saw him in the office 07/01/13. He has a history of CAD status post bypass grafting in 1990 with a St. Jude AVR at that time. He had redo CABG in 1994. He has had percutaneous intervention to his vein grafts. I stented his SFAs bilaterally, as well as his right external iliac artery and had performed staged Diamondback orbital rotational atherectomy. We have been following his carotid and lower extremity Dopplers on a serial basis. His other problems include continued tobacco abuse, hypertension, hyperlipidemia, type 2 diabetes, and obstructive sleep apnea. He just saw Dr. [name] back last month. Most recent stress test performed a year ago was nonischemic. His last Doppler study of his lower extremities performed a year ago revealed patent stents, and carotid Dopplers revealed mild to moderate left ICA stenosis. He is neurologically asymptomatic. He does complain of hip pain with ambulation which may be musculoskeletal versus related to claudication. He's had a 2-D echo recently that showed a well functioning aortic prosthesis with mild to moderate LV dysfunction. A Myoview stress test did show some anterolateral ischemia but this has been reviewed by Dr. [name]. He has a Nonhealing ulcer on his left great toe with some cyanosis of his left foot. The patient head abdominal aortography with bilateral runoff and potential endovascular therapy for critical limb ischemia on 02/15/13 revealing patent SFA stents bilaterally with severe infrapopliteal disease. He had one vessel runoff via a diseased posterior tibial on  the left. He was admitted with acute coronary syndrome and underwent stenting of his RCA vein graft by Dr. Casandra Doffing. Since that time his left second toe has been progressively gangrenous with superimposed infection. He saw his primary care physician, Dr. Gerarda Fraction, who placed him on cephalexin. He has not had a Doppler study since June 2014. I believe he'll need re\re angiography and potential intervention on his left posterior tibial artery is a modest peroneal arteries.  Current Outpatient Prescriptions  Medication Sig Dispense Refill  . albuterol (VOSPIRE ER) 4 MG 12 hr tablet Take 4 mg by mouth 2 (two) times daily.       [name] albuterol-ipratropium (COMBIVENT) 18-103 MCG/ACT inhaler Inhale 1 puff into the lungs 4 (four) times daily. Coughing/ Shortness of Breath      . allopurinol (ZYLOPRIM) 300 MG tablet Take 300 mg by mouth at bedtime.       . cephALEXin (KEFLEX) 500 MG capsule Take 500 mg by mouth 4 (four) times daily.       . clopidogrel (PLAVIX) 75 MG tablet Take 1 tablet (75 mg total) by mouth daily with breakfast.  30 tablet  11  . fenofibrate (TRICOR) 145 MG tablet Take 1 tablet (145 mg total) by mouth daily.  30 tablet  9  . fish oil-omega-3 fatty acids 1000 MG capsule Take 1 capsule (1 g total) by mouth 2 (two) times daily.      . furosemide (LASIX) 80 MG tablet Take 40 mg by mouth daily.      [name] glimepiride (AMARYL) 1 MG tablet Take 1 mg by mouth 2 (two) times daily.      . isosorbide mononitrate (IMDUR)  30 MG 24 hr tablet Take 1 tablet (30 mg total) by mouth daily.  30 tablet  11  . metoprolol succinate (TOPROL-XL) 50 MG 24 hr tablet Take 1 tablet (50 mg total) by mouth daily. Take with or immediately following a meal.  30 tablet  11  . nicotine (NICODERM CQ - DOSED IN MG/24 HOURS) 14 mg/24hr patch Place 1 patch (14 mg total) onto the skin daily.  28 patch  0  . nitroGLYCERIN (NITROSTAT) 0.4 MG SL tablet Place 1 tablet (0.4 mg total) under the tongue every 5 (five) minutes as needed for  chest pain.  25 tablet  12  . oxyCODONE-acetaminophen (PERCOCET) 10-325 MG per tablet Take 1 tablet by mouth every 3 (three) hours as needed for pain.       . potassium chloride SA (K-DUR,KLOR-CON) 20 MEQ tablet Take 20 mEq by mouth daily.        . pregabalin (LYRICA) 50 MG capsule Take 50 mg by mouth 2 (two) times daily.       . ramipril (ALTACE) 5 MG capsule Take 2.5-5 mg by mouth 2 (two) times daily. Take 1 tablet in the morning and 1/2 tablet at bedtime.      . rosuvastatin (CRESTOR) 20 MG tablet Take 20 mg by mouth at bedtime.      . sitaGLIPtin (JANUVIA) 50 MG tablet Take 50 mg by mouth daily.      [name] warfarin (COUMADIN) 5 MG tablet Take 1-1.5 tablets (5-7.5 mg total) by mouth at bedtime. Takes one and one-half tablet (7.5mg  total) on Mondays and Fridays. Takes one tablet (5mg  total) on all other days  30 tablet  11   No current facility-administered medications for this visit.    No Known Allergies  History   Social History  . Marital Status: Legally Separated    Spouse Name: N/A    Number of Children: 5  . Years of Education: N/A   Occupational History  . retired     Stage manager   Social History Main Topics  . Smoking status: Former Smoker -- 1.00 packs/day for 55 years    Types: Cigarettes    Start date: 08/19/1953    Quit date: 05/05/2014  . Smokeless tobacco: Former Systems developer    Types: [place] date: 08/20/1995     Comment: Has quit on 3 occasions. Counseling given today 5-10 minutes   I am more than likely going to quit "  . Alcohol Use: Yes     Comment: socially, sometimes 12 ounce beer daily, may go month without/no whiskey  . Drug Use: No  . Sexual Activity: Not on file   Other Topics Concern  . Not on file   Social History Narrative   Has 3 daughters   Has 2 sons     Review of Systems: General: negative for chills, fever, night sweats or weight changes.  Cardiovascular: negative for chest pain, dyspnea on exertion, edema, orthopnea, palpitations,  paroxysmal nocturnal dyspnea or shortness of breath Dermatological: negative for rash Respiratory: negative for cough or wheezing Urologic: negative for hematuria Abdominal: negative for nausea, vomiting, diarrhea, bright red blood per rectum, melena, or hematemesis Neurologic: negative for visual changes, syncope, or dizziness All other systems reviewed and are otherwise negative except as noted above.    Blood pressure 140/58, pulse 60, height 5' 10.5" (1.791 m), weight 219 lb (99.338 kg).  General appearance: alert and no distress Neck: no adenopathy, no JVD, supple, symmetrical, trachea midline, thyroid  not enlarged, symmetric, no tenderness/mass/nodules and loud right carotid bruit Lungs: clear to auscultation bilaterally Heart: crisp valve sounds  Extremities: gangrenous left second toe with superimposed erythema and edema  EKG normal sinus rhythm at 60 with occasional PVCs and left bundle branch block  ASSESSMENT AND PLAN:   Critical lower limb ischemia Mr. Melland has had stenting of his SFAs in the past bilaterally. He's also had diamondback orbital rotational atherectomy of his right external iliac artery. I less performed angiography on him 01/19/13 revealing patent iliac and SFA stents with moderate calcified disease in his distal SFA pump adductor canal with severe infrapopliteal disease. His anterior tibial and peroneal occluded and the posterior tibial diffusely diseased. 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 His primary care physician put him on cephalexin. I'm going to recheck Dopplers arrange for him to undergo angiography and potential intervention for limb salvage. He is on Coumadin for a St. Jude aortic valve which will be held for several days prior to his angiogram.      [name] MD [facility], [facility] 05/25/2014 11:14 AM   Addendum: I  thoroughly discussed the risks and benefits of the procedure including death, myocardial infarction, stroke, acute renal failure and limb loss. The patient and his family agree to proceed.

## 2014-05-25 NOTE — Patient Instructions (Addendum)
Dr. [name] has ordered a peripheral angiogram to be done at [facility].  This procedure is going to look at the bloodflow in your lower extremities.  If Dr. [name] is able to open up the arteries, you will have to spend one night in the hospital.  If he is not able to open the arteries, you will be able to go home that same day.    After the procedure, you will not be allowed to drive for 3 days or push, pull, or lift anything greater than 10 lbs for one week.    You will be required to have the following tests prior to the procedure:  1. Blood work-the blood work can be done no more than 7 days prior to the procedure.  It can be done at any [facility] lab.  There is one downstairs on the first floor of this building and one in the [place] ([address])   *REPS [employer] your coumadin for 4 days prior to the angiogram.  These need to be done prior to the angiogram:  Carotid Duplex- This test is an ultrasound of the carotid arteries in your neck. It looks at blood flow through these arteries that supply the brain with blood. Allow one hour for this exam. There are no restrictions or special instructions. lower extremity arterial doppler- During this test, ultrasound is used to evaluate arterial blood flow in the legs. Allow approximately one hour for this exam.

## 2014-05-27 ENCOUNTER — Ambulatory Visit: Payer: PRIVATE HEALTH INSURANCE | Admitting: Cardiovascular Disease

## 2014-05-31 ENCOUNTER — Encounter (HOSPITAL_COMMUNITY): Payer: Self-pay | Admitting: Pharmacy Technician

## 2014-05-31 LAB — PROTIME-INR
INR: 3.17 — ABNORMAL HIGH (ref <1.50)
PROTHROMBIN TIME: 32.5 s — AB (ref 11.6–15.2)

## 2014-05-31 LAB — APTT: aPTT: 45 seconds — ABNORMAL HIGH (ref 24–37)

## 2014-06-01 ENCOUNTER — Ambulatory Visit: Payer: PRIVATE HEALTH INSURANCE | Admitting: Cardiology

## 2014-06-01 ENCOUNTER — Ambulatory Visit (HOSPITAL_BASED_OUTPATIENT_CLINIC_OR_DEPARTMENT_OTHER)
Admission: RE | Admit: 2014-06-01 | Discharge: 2014-06-01 | Disposition: A | Discharge status: Home / Self Care | Payer: PRIVATE HEALTH INSURANCE | Source: Ambulatory Visit | Attending: Cardiology | Admitting: Cardiology

## 2014-06-01 ENCOUNTER — Ambulatory Visit (HOSPITAL_COMMUNITY)
Admission: RE | Admit: 2014-06-01 | Discharge: 2014-06-01 | Disposition: A | Discharge status: Home / Self Care | Payer: PRIVATE HEALTH INSURANCE | Source: Ambulatory Visit | Attending: Cardiology | Admitting: Cardiology

## 2014-06-01 DIAGNOSIS — I739 Peripheral vascular disease, unspecified: Secondary | ICD-10-CM

## 2014-06-01 LAB — CBC
HCT: 38.7 % — ABNORMAL LOW (ref 39.0–52.0)
Hemoglobin: 12.5 g/dL — ABNORMAL LOW (ref 13.0–17.0)
MCH: 29.2 pg (ref 26.0–34.0)
MCHC: 32.3 g/dL (ref 30.0–36.0)
MCV: 90.4 fL (ref 78.0–100.0)
PLATELETS: 318 10*3/uL (ref 150–400)
RBC: 4.28 MIL/uL (ref 4.22–5.81)
RDW: 14.5 % (ref 11.5–15.5)
WBC: 8.1 10*3/uL (ref 4.0–10.5)

## 2014-06-01 LAB — BASIC METABOLIC PANEL
BUN: 25 mg/dL — AB (ref 6–23)
CO2: 26 mEq/L (ref 19–32)
CREATININE: 1.15 mg/dL (ref 0.50–1.35)
Calcium: 9.5 mg/dL (ref 8.4–10.5)
Chloride: 100 mEq/L (ref 96–112)
GLUCOSE: 118 mg/dL — AB (ref 70–99)
Potassium: 4.7 mEq/L (ref 3.5–5.3)
Sodium: 139 mEq/L (ref 135–145)

## 2014-06-01 LAB — TSH: TSH: 2.666 u[IU]/mL (ref 0.350–4.500)

## 2014-06-01 NOTE — Progress Notes (Signed)
Lower Extremity Arterial Duplex Completed. °Brianna L Mazza,RVT °

## 2014-06-01 NOTE — Progress Notes (Signed)
Carotid Duplex Completed. °Brianna L Mazza,RVT °

## 2014-06-06 ENCOUNTER — Encounter (HOSPITAL_COMMUNITY)
Admission: RE | Disposition: A | Discharge status: Home / Self Care | Payer: PRIVATE HEALTH INSURANCE | Source: Ambulatory Visit | Attending: Cardiovascular Disease

## 2014-06-06 ENCOUNTER — Ambulatory Visit (HOSPITAL_COMMUNITY)
Admission: RE | Admit: 2014-06-06 | Discharge: 2014-06-08 | Disposition: A | Discharge status: Home / Self Care | Payer: PRIVATE HEALTH INSURANCE | Source: Ambulatory Visit | Attending: Cardiovascular Disease | Admitting: Cardiovascular Disease

## 2014-06-06 DIAGNOSIS — G4733 Obstructive sleep apnea (adult) (pediatric): Secondary | ICD-10-CM | POA: Diagnosis not present

## 2014-06-06 DIAGNOSIS — L97509 Non-pressure chronic ulcer of other part of unspecified foot with unspecified severity: Secondary | ICD-10-CM | POA: Diagnosis present

## 2014-06-06 DIAGNOSIS — I70212 Atherosclerosis of native arteries of extremities with intermittent claudication, left leg: Secondary | ICD-10-CM

## 2014-06-06 DIAGNOSIS — L97529 Non-pressure chronic ulcer of other part of left foot with unspecified severity: Secondary | ICD-10-CM | POA: Diagnosis not present

## 2014-06-06 DIAGNOSIS — Z951 Presence of aortocoronary bypass graft: Secondary | ICD-10-CM | POA: Diagnosis not present

## 2014-06-06 DIAGNOSIS — F17210 Nicotine dependence, cigarettes, uncomplicated: Secondary | ICD-10-CM | POA: Diagnosis not present

## 2014-06-06 DIAGNOSIS — I447 Left bundle-branch block, unspecified: Secondary | ICD-10-CM | POA: Diagnosis not present

## 2014-06-06 DIAGNOSIS — R0902 Hypoxemia: Secondary | ICD-10-CM | POA: Diagnosis not present

## 2014-06-06 DIAGNOSIS — E119 Type 2 diabetes mellitus without complications: Secondary | ICD-10-CM | POA: Insufficient documentation

## 2014-06-06 DIAGNOSIS — J449 Chronic obstructive pulmonary disease, unspecified: Secondary | ICD-10-CM | POA: Diagnosis not present

## 2014-06-06 DIAGNOSIS — I739 Peripheral vascular disease, unspecified: Secondary | ICD-10-CM | POA: Diagnosis present

## 2014-06-06 DIAGNOSIS — Z952 Presence of prosthetic heart valve: Secondary | ICD-10-CM | POA: Diagnosis not present

## 2014-06-06 DIAGNOSIS — F419 Anxiety disorder, unspecified: Secondary | ICD-10-CM | POA: Insufficient documentation

## 2014-06-06 DIAGNOSIS — E785 Hyperlipidemia, unspecified: Secondary | ICD-10-CM | POA: Diagnosis not present

## 2014-06-06 DIAGNOSIS — I255 Ischemic cardiomyopathy: Secondary | ICD-10-CM | POA: Insufficient documentation

## 2014-06-06 DIAGNOSIS — Z720 Tobacco use: Secondary | ICD-10-CM

## 2014-06-06 DIAGNOSIS — I70229 Atherosclerosis of native arteries of extremities with rest pain, unspecified extremity: Secondary | ICD-10-CM

## 2014-06-06 DIAGNOSIS — I2510 Atherosclerotic heart disease of native coronary artery without angina pectoris: Secondary | ICD-10-CM

## 2014-06-06 DIAGNOSIS — I998 Other disorder of circulatory system: Secondary | ICD-10-CM | POA: Insufficient documentation

## 2014-06-06 DIAGNOSIS — I10 Essential (primary) hypertension: Secondary | ICD-10-CM | POA: Diagnosis not present

## 2014-06-06 DIAGNOSIS — E1159 Type 2 diabetes mellitus with other circulatory complications: Secondary | ICD-10-CM

## 2014-06-06 DIAGNOSIS — Z7901 Long term (current) use of anticoagulants: Secondary | ICD-10-CM | POA: Diagnosis not present

## 2014-06-06 DIAGNOSIS — I70245 Atherosclerosis of native arteries of left leg with ulceration of other part of foot: Secondary | ICD-10-CM | POA: Diagnosis not present

## 2014-06-06 DIAGNOSIS — I493 Ventricular premature depolarization: Secondary | ICD-10-CM

## 2014-06-06 HISTORY — PX: LOWER EXTREMITY ANGIOGRAM: SHX5508

## 2014-06-06 LAB — GLUCOSE, CAPILLARY
Glucose-Capillary: 128 mg/dL — ABNORMAL HIGH (ref 70–99)
Glucose-Capillary: 135 mg/dL — ABNORMAL HIGH (ref 70–99)
Glucose-Capillary: 208 mg/dL — ABNORMAL HIGH (ref 70–99)

## 2014-06-06 LAB — PROTIME-INR
INR: 1.39 (ref 0.00–1.49)
PROTHROMBIN TIME: 17.2 s — AB (ref 11.6–15.2)

## 2014-06-06 LAB — MRSA PCR SCREENING: MRSA BY PCR: NEGATIVE

## 2014-06-06 SURGERY — ANGIOGRAM, LOWER EXTREMITY
Anesthesia: LOCAL

## 2014-06-06 MED ORDER — OXYCODONE HCL 5 MG PO TABS
5.0000 mg | ORAL_TABLET | ORAL | Status: DC | PRN
  Administered 2014-06-06 – 2014-06-08 (×8): 5 mg via ORAL
  Filled 2014-06-06 (×9): qty 1

## 2014-06-06 MED ORDER — CLOPIDOGREL BISULFATE 75 MG PO TABS
75.0000 mg | ORAL_TABLET | Freq: Every day | ORAL | Status: DC
  Administered 2014-06-07 – 2014-06-08 (×2): 75 mg via ORAL
  Filled 2014-06-06 (×4): qty 1

## 2014-06-06 MED ORDER — ROSUVASTATIN CALCIUM 20 MG PO TABS
20.0000 mg | ORAL_TABLET | Freq: Every day | ORAL | Status: DC
  Administered 2014-06-06 – 2014-06-07 (×2): 20 mg via ORAL
  Filled 2014-06-06 (×3): qty 1

## 2014-06-06 MED ORDER — ASPIRIN 81 MG PO CHEW
81.0000 mg | CHEWABLE_TABLET | ORAL | Status: AC
  Administered 2014-06-06: 81 mg via ORAL

## 2014-06-06 MED ORDER — FENOFIBRATE 54 MG PO TABS
54.0000 mg | ORAL_TABLET | Freq: Every morning | ORAL | Status: DC
  Administered 2014-06-07 – 2014-06-08 (×2): 54 mg via ORAL
  Filled 2014-06-06 (×2): qty 1

## 2014-06-06 MED ORDER — NICOTINE 14 MG/24HR TD PT24
14.0000 mg | MEDICATED_PATCH | Freq: Every day | TRANSDERMAL | Status: DC
  Administered 2014-06-06 – 2014-06-08 (×3): 14 mg via TRANSDERMAL
  Filled 2014-06-06 (×3): qty 1

## 2014-06-06 MED ORDER — GLIMEPIRIDE 1 MG PO TABS
1.0000 mg | ORAL_TABLET | Freq: Two times a day (BID) | ORAL | Status: DC
  Administered 2014-06-06 – 2014-06-08 (×4): 1 mg via ORAL
  Filled 2014-06-06 (×5): qty 1

## 2014-06-06 MED ORDER — ALLOPURINOL 300 MG PO TABS
300.0000 mg | ORAL_TABLET | Freq: Every day | ORAL | Status: DC
  Administered 2014-06-06 – 2014-06-07 (×2): 300 mg via ORAL
  Filled 2014-06-06 (×3): qty 1

## 2014-06-06 MED ORDER — IPRATROPIUM-ALBUTEROL 0.5-2.5 (3) MG/3ML IN SOLN: 3.0000 mL | Freq: Four times a day (QID) | RESPIRATORY_TRACT | Status: DC | PRN

## 2014-06-06 MED ORDER — RAMIPRIL 5 MG PO CAPS
5.0000 mg | ORAL_CAPSULE | Freq: Every day | ORAL | Status: DC
  Administered 2014-06-06 – 2014-06-08 (×3): 5 mg via ORAL
  Filled 2014-06-06 (×3): qty 1

## 2014-06-06 MED ORDER — OXYCODONE-ACETAMINOPHEN 5-325 MG PO TABS
1.0000 | ORAL_TABLET | ORAL | Status: DC | PRN
  Administered 2014-06-06 – 2014-06-08 (×8): 1 via ORAL
  Filled 2014-06-06 (×9): qty 1

## 2014-06-06 MED ORDER — RAMIPRIL 2.5 MG PO CAPS
2.5000 mg | ORAL_CAPSULE | Freq: Every day | ORAL | Status: DC
  Administered 2014-06-06 – 2014-06-07 (×2): 2.5 mg via ORAL
  Filled 2014-06-06 (×3): qty 1

## 2014-06-06 MED ORDER — SODIUM CHLORIDE 0.9 % IV SOLN
INTRAVENOUS | Status: AC
  Administered 2014-06-06: 75 mL/h via INTRAVENOUS

## 2014-06-06 MED ORDER — PREGABALIN 50 MG PO CAPS
50.0000 mg | ORAL_CAPSULE | Freq: Two times a day (BID) | ORAL | Status: DC
  Administered 2014-06-06 – 2014-06-08 (×4): 50 mg via ORAL
  Filled 2014-06-06 (×4): qty 1

## 2014-06-06 MED ORDER — OXYCODONE-ACETAMINOPHEN 10-325 MG PO TABS: 1.0000 | ORAL_TABLET | ORAL | Status: DC | PRN

## 2014-06-06 MED ORDER — METOPROLOL SUCCINATE ER 50 MG PO TB24
50.0000 mg | ORAL_TABLET | Freq: Every day | ORAL | Status: DC
  Administered 2014-06-07 – 2014-06-08 (×2): 50 mg via ORAL
  Filled 2014-06-06 (×2): qty 1

## 2014-06-06 MED ORDER — ALBUTEROL SULFATE ER 4 MG PO TB12
4.0000 mg | ORAL_TABLET | Freq: Two times a day (BID) | ORAL | Status: DC
  Administered 2014-06-07 – 2014-06-08 (×3): 4 mg via ORAL
  Filled 2014-06-06 (×5): qty 1

## 2014-06-06 MED ORDER — POTASSIUM CHLORIDE CRYS ER 20 MEQ PO TBCR
20.0000 meq | EXTENDED_RELEASE_TABLET | Freq: Every day | ORAL | Status: DC
  Administered 2014-06-07 – 2014-06-08 (×2): 20 meq via ORAL
  Filled 2014-06-06 (×2): qty 1

## 2014-06-06 MED ORDER — FUROSEMIDE 40 MG PO TABS
40.0000 mg | ORAL_TABLET | Freq: Every day | ORAL | Status: DC
  Administered 2014-06-06 – 2014-06-08 (×3): 40 mg via ORAL
  Filled 2014-06-06 (×3): qty 1

## 2014-06-06 MED ORDER — SODIUM CHLORIDE 0.9 % IJ SOLN: 3.0000 mL | INTRAMUSCULAR | Status: DC | PRN

## 2014-06-06 MED ORDER — LINAGLIPTIN 5 MG PO TABS
5.0000 mg | ORAL_TABLET | Freq: Every day | ORAL | Status: DC
  Administered 2014-06-06 – 2014-06-08 (×3): 5 mg via ORAL
  Filled 2014-06-06 (×3): qty 1

## 2014-06-06 MED ORDER — MIDAZOLAM HCL 2 MG/2ML IJ SOLN
INTRAMUSCULAR | Status: AC
  Filled 2014-06-06: qty 2

## 2014-06-06 MED ORDER — ISOSORBIDE MONONITRATE ER 30 MG PO TB24
30.0000 mg | ORAL_TABLET | Freq: Every day | ORAL | Status: DC
  Administered 2014-06-07 – 2014-06-08 (×2): 30 mg via ORAL
  Filled 2014-06-06 (×2): qty 1

## 2014-06-06 MED ORDER — ASPIRIN 81 MG PO CHEW
CHEWABLE_TABLET | ORAL | Status: AC
  Administered 2014-06-06: 81 mg via ORAL
  Filled 2014-06-06: qty 1

## 2014-06-06 MED ORDER — CEPHALEXIN 500 MG PO CAPS
500.0000 mg | ORAL_CAPSULE | Freq: Four times a day (QID) | ORAL | Status: DC
  Administered 2014-06-06 – 2014-06-08 (×7): 500 mg via ORAL
  Filled 2014-06-06 (×10): qty 1

## 2014-06-06 MED ORDER — HEPARIN (PORCINE) IN NACL 2-0.9 UNIT/ML-% IJ SOLN
INTRAMUSCULAR | Status: AC
  Filled 2014-06-06: qty 2000

## 2014-06-06 MED ORDER — ACETAMINOPHEN 325 MG PO TABS: 650.0000 mg | ORAL_TABLET | ORAL | Status: DC | PRN

## 2014-06-06 MED ORDER — MORPHINE SULFATE 2 MG/ML IJ SOLN
1.0000 mg | INTRAMUSCULAR | Status: DC | PRN
Start: 2014-06-06 — End: 2014-06-08

## 2014-06-06 MED ORDER — OXYCODONE HCL 5 MG PO TABS: 5.0000 mg | ORAL_TABLET | ORAL | Status: DC | PRN

## 2014-06-06 MED ORDER — ONDANSETRON HCL 4 MG/2ML IJ SOLN
4.0000 mg | Freq: Four times a day (QID) | INTRAMUSCULAR | Status: DC | PRN
Start: 2014-06-06 — End: 2014-06-08

## 2014-06-06 MED ORDER — OXYCODONE-ACETAMINOPHEN 5-325 MG PO TABS: 1.0000 | ORAL_TABLET | ORAL | Status: DC | PRN

## 2014-06-06 MED ORDER — RAMIPRIL 2.5 MG PO CAPS: 2.5000 mg | ORAL_CAPSULE | Freq: Two times a day (BID) | ORAL | Status: DC

## 2014-06-06 MED ORDER — NITROGLYCERIN 0.4 MG SL SUBL: 0.4000 mg | SUBLINGUAL_TABLET | SUBLINGUAL | Status: DC | PRN

## 2014-06-06 MED ORDER — FENTANYL CITRATE 0.05 MG/ML IJ SOLN
INTRAMUSCULAR | Status: AC
  Filled 2014-06-06: qty 2

## 2014-06-06 MED ORDER — LIDOCAINE HCL (PF) 1 % IJ SOLN
INTRAMUSCULAR | Status: AC
  Filled 2014-06-06: qty 60

## 2014-06-06 MED ORDER — SODIUM CHLORIDE 0.9 % IV SOLN
INTRAVENOUS | Status: DC
  Administered 2014-06-06: 1000 mL via INTRAVENOUS

## 2014-06-06 NOTE — Progress Notes (Signed)
Utilization Review Completed.Donne Anon T10/19/2015

## 2014-06-06 NOTE — CV Procedure (Signed)
Tanner Pena is a 73 y.o. male    [id number] LOCATION:  FACILITY: [place]  PHYSICIAN: Tanner Pena, M.D. 04-Feb-1941   DATE OF PROCEDURE:  06/06/2014  DATE OF DISCHARGE:     PV Angiogram/Intervention    History obtained from chart review.The patient is a 73 year old, moderately overweight, married, Caucasian male father of 75, grandfather to 8 grandchildren. The patient is a cardiology patient of Dr. Lenise Pena who I follow for peripheral vascular disease. I last saw him in the office 07/01/13. He has a history of CAD status post bypass grafting in 1990 with a St. Jude AVR at that time. He had redo CABG in 1994. He has had percutaneous intervention to his vein grafts. I stented his SFAs bilaterally, as well as his right external iliac artery and had performed staged Diamondback orbital rotational atherectomy. We have been following his carotid and lower extremity Dopplers on a serial basis. His other problems include continued tobacco abuse, hypertension, hyperlipidemia, type 2 diabetes, and obstructive sleep apnea. He just saw Dr. Claiborne Pena back last month. Most recent stress test performed a year ago was nonischemic. His last Doppler study of his lower extremities performed a year ago revealed patent stents, and carotid Dopplers revealed mild to moderate left ICA stenosis. He is neurologically asymptomatic. He does complain of hip pain with ambulation which may be musculoskeletal versus related to claudication. He's had a 2-D echo recently that showed a well functioning aortic prosthesis with mild to moderate LV dysfunction. A Myoview stress test did show some anterolateral ischemia but this has been reviewed by Dr. Claiborne Pena. He has a Nonhealing ulcer on his left great toe with some cyanosis of his left foot. The patient head abdominal aortography with bilateral runoff and potential endovascular therapy for critical limb ischemia on 02/15/13 revealing patent SFA stents bilaterally with severe  infrapopliteal disease. He had one vessel runoff via a diseased posterior tibial on the left. He was admitted with acute coronary syndrome and underwent stenting of his RCA vein graft by Dr. Casandra Pena. Since that time his left second toe has been progressively gangrenous with superimposed infection. He saw his primary care physician, Dr. Gerarda Pena, who placed him on cephalexin. He has not had a Doppler study since June 2014. I believe he'll need re\re angiography and potential intervention on his left posterior tibial artery aand potentially his peroneal artery.    PROCEDURE DESCRIPTION:   The patient was brought to the second floor [facility] lab in the postabsorptive state. He was premedicated with Valium 5 mg by mouth, IV Versed and fentanyl. His right groinwas prepped and shaved in usual sterile fashion. Xylocaine 1% was used for local anesthesia. A 5 French sheath was inserted into the right common femoral artery using standard Seldinger technique.a 5 French pigtail catheter was placed in the distal abdominal aorta. Distal abdominal aortography and bilateral iliac angiography were performed. Contralateral access was obtained with a 5 [place] crossover catheter and endhole catheter. Left lower extremity angiography with run off was performed using bolus chase digital subtraction step table technique. Omnipaque dye  was used for the entirety of the case.retrograde aortic pressure was monitored during the case.   HEMODYNAMICS:    AO SYSTOLIC/AO DIASTOLIC: 846/96   Angiographic Data:   1: Abdominal aorta-the distal abdominal aorta was fluoroscopically calcified but free of significant disease  2: Left lower extremity-the left iliac system was fluoroscopically calcified. There was a 90% calcified exophytic plaque at the origin of the left profunda  femoris. There is 50% left SFA ostial stenosis. The entire stented segment in the mid left SFA was patent with moderate calcified disease just  distal to this. There is 1 vessel runoff via the perineal which was a diffusely diseased vessel and terminated at the level of the ankle. The peroneal and anterior tibial arteries were occluded.  IMPRESSION:Mr. Tanner Pena has critical limb ischemia with one-vessel runoff only to the ankle via posterior tibial artery that has diffuse high-grade disease. I believe that percutaneous intervention would be difficult at best. I suspect his posterior tibial artery throughout its entirety could be atherectomized and dilated though I am unsure whether to the continuation below the ankle is revascularizable. The patient became hypoxic and anxious during the case with dusky looking lips and a sat of 87% despite being on 2 L of nasal oxygen. He became somewhat hypotensive as well. At the termination of the case the patient reported that he needed to have a bowel movement which was "excessive" in which resulted in marked improvement in his constitutional state. The catheter was then removed, the sheath was secured and the patient left the lab in stable condition. The sheath will be removed and pressure will be held. I will observe him overnight and step down and will restart his Coumadin at discharge. I will see him back in the office and refer him to Tanner Pena for consideration of pedal access.    [name] MD, [facility] 06/06/2014 2:31 PM

## 2014-06-06 NOTE — Progress Notes (Signed)
I have seen the patient and his family in short stay today. I have reiterated the nature of the procedure including the risks and benefits which we previously discussed in the office. The patient and his family agree to proceed. The risks include death, myocardial infarction, acute renal failure, limb loss.   [name], M.D., [place], [facility], [place] [address] [place], [place]  [address]  [phone] 06/06/2014 1:06 PM ,

## 2014-06-06 NOTE — H&P (Addendum)
    Pt was reexamined and existing H & P reviewed. No changes found.  [name], MD [facility] 06/06/2014 1:18 PM

## 2014-06-06 NOTE — Progress Notes (Signed)
71fr sheath aspirated and removed from rfa. Manual pressure applied for 20 minutes. No s+s of hematoma. Groin level 0. Tegaderm dressing applied, bedrest instructions given. Distal pulses are doppler bilateral pt and left dp. Right dp absent.

## 2014-06-07 ENCOUNTER — Encounter (HOSPITAL_COMMUNITY): Payer: Self-pay | Admitting: *Deleted

## 2014-06-07 DIAGNOSIS — I998 Other disorder of circulatory system: Secondary | ICD-10-CM

## 2014-06-07 DIAGNOSIS — I493 Ventricular premature depolarization: Secondary | ICD-10-CM

## 2014-06-07 DIAGNOSIS — I70245 Atherosclerosis of native arteries of left leg with ulceration of other part of foot: Secondary | ICD-10-CM | POA: Diagnosis not present

## 2014-06-07 DIAGNOSIS — I2510 Atherosclerotic heart disease of native coronary artery without angina pectoris: Secondary | ICD-10-CM

## 2014-06-07 DIAGNOSIS — E1159 Type 2 diabetes mellitus with other circulatory complications: Secondary | ICD-10-CM

## 2014-06-07 DIAGNOSIS — Z720 Tobacco use: Secondary | ICD-10-CM

## 2014-06-07 DIAGNOSIS — I739 Peripheral vascular disease, unspecified: Secondary | ICD-10-CM

## 2014-06-07 LAB — BASIC METABOLIC PANEL
ANION GAP: 10 (ref 5–15)
BUN: 30 mg/dL — AB (ref 6–23)
CHLORIDE: 101 meq/L (ref 96–112)
CO2: 28 mEq/L (ref 19–32)
Calcium: 9.8 mg/dL (ref 8.4–10.5)
Creatinine, Ser: 1.21 mg/dL (ref 0.50–1.35)
GFR calc non Af Amer: 58 mL/min — ABNORMAL LOW (ref >90)
GFR, EST AFRICAN AMERICAN: 67 mL/min — AB (ref >90)
Glucose, Bld: 152 mg/dL — ABNORMAL HIGH (ref 70–99)
POTASSIUM: 4.4 meq/L (ref 3.7–5.3)
SODIUM: 139 meq/L (ref 137–147)

## 2014-06-07 LAB — GLUCOSE, CAPILLARY
GLUCOSE-CAPILLARY: 110 mg/dL — AB (ref 70–99)
GLUCOSE-CAPILLARY: 104 mg/dL — AB (ref 70–99)
GLUCOSE-CAPILLARY: 199 mg/dL — AB (ref 70–99)
GLUCOSE-CAPILLARY: 219 mg/dL — AB (ref 70–99)
Glucose-Capillary: 152 mg/dL — ABNORMAL HIGH (ref 70–99)
Glucose-Capillary: 174 mg/dL — ABNORMAL HIGH (ref 70–99)

## 2014-06-07 LAB — CLOSTRIDIUM DIFFICILE BY PCR: Toxigenic C. Difficile by PCR: NEGATIVE

## 2014-06-07 NOTE — Plan of Care (Signed)
Problem: Consults Goal: Tobacco Cessation referral if indicated Outcome: Completed/Met Date Met:  06/06/14 Patient reports he has not smoked for one month and three days.   Problem: Phase I Progression Outcomes Goal: Distal pulses equal to baseline Outcome: Not Progressing Doppler pulses on left foot present. See flow sheet.

## 2014-06-07 NOTE — Progress Notes (Signed)
PROGRESS NOTE  Subjective:   Pt is a 73 yo with hx of PVD Admitted overnight after a PV procedure because of hypoxemia and anxiety. Felt better after a bowel movement.    Objective:    Vital Signs:   Temp:  [96.7 F (35.9 C)-98.8 F (37.1 C)] 97.9 F (36.6 C) (10/20 1220) Pulse Rate:  [25-79] 51 (10/20 0400) Resp:  [14-35] 20 (10/20 1220) BP: (64-182)/(28-77) 130/38 mmHg (10/20 1220) SpO2:  [95 %-98 %] 98 % (10/20 1220) Weight:  [210 lb 15.7 oz (95.7 kg)] 210 lb 15.7 oz (95.7 kg) (10/19 1545)  Last BM Date: 06/06/14   24-hour weight change: Weight change:   Weight trends: Filed Weights   06/06/14 0935 06/06/14 1545  Weight: 219 lb (99.338 kg) 210 lb 15.7 oz (95.7 kg)    Intake/Output:  10/19 0701 - 10/20 0700 In: 1320 [P.O.:720; I.V.:600] Out: 700 [Urine:700]     Physical Exam: BP 130/38  Pulse 51  Temp(Src) 97.9 F (36.6 C) (Oral)  Resp 20  Ht 5\' 10"  (1.778 m)  Wt 210 lb 15.7 oz (95.7 kg)  BMI 30.27 kg/m2  SpO2 98%  Wt Readings from Last 3 Encounters:  06/06/14 210 lb 15.7 oz (95.7 kg)  06/06/14 210 lb 15.7 oz (95.7 kg)  05/25/14 219 lb (99.338 kg)    General: Vital signs reviewed and noted.   Head: Normocephalic, atraumatic.  Eyes: conjunctivae/corneas clear.  EOM's intact.   Throat: normal  Neck:  normal   Lungs:    clear   Heart:  RR, occasional premature beats  Abdomen:  Soft, non-tender, non-distended    Extremities: Groin looks ok   Neurologic: A&O X3, CN II - XII are grossly intact.   Psych: Normal     Labs: BMET: No results found for this basename: NA, K, CL, CO2, GLUCOSE, BUN, CREATININE, CALCIUM, MG, PHOS,  in the last 72 hours  Liver function tests: No results found for this basename: AST, ALT, ALKPHOS, BILITOT, PROT, ALBUMIN,  in the last 72 hours No results found for this basename: LIPASE, AMYLASE,  in the last 72 hours  CBC: No results found for this basename: WBC, NEUTROABS, HGB, HCT, MCV, PLT,  in the last 72  hours  Cardiac Enzymes: No results found for this basename: CKTOTAL, CKMB, TROPONINI,  in the last 72 hours  Coagulation Studies:  Recent Labs  06/06/14 0929  LABPROT 17.2*  INR 1.39     Other results:  Tele:  NSR, frequent PVCs   Medications:    Infusions:    Scheduled Medications: . albuterol  4 mg Oral BID  . allopurinol  300 mg Oral QHS  . cephALEXin  500 mg Oral QID  . clopidogrel  75 mg Oral Q breakfast  . fenofibrate  54 mg Oral q morning - 10a  . furosemide  40 mg Oral Daily  . glimepiride  1 mg Oral BID  . isosorbide mononitrate  30 mg Oral Daily  . linagliptin  5 mg Oral Daily  . metoprolol succinate  50 mg Oral Daily  . nicotine  14 mg Transdermal Daily  . potassium chloride SA  20 mEq Oral Daily  . pregabalin  50 mg Oral BID  . ramipril  2.5 mg Oral QHS  . ramipril  5 mg Oral Daily  . rosuvastatin  20 mg Oral QHS    Assessment/ Plan:     1.   Critical lower limb ischemia / PAD  Pt had an OP procedure yesterday - had some mild complications - anxiety and hypoxemia and was observed overnight. Doing well today BMP was not ordered.  Will get today  OK to go home Restart coumadin Continue all his home meds.   2.  CAD:  No angina   3.  COPD  - needs to stop smoking   4.  Hyperlipidemia  5. Diabetes Mellitus:  Continue meds    Disposition: Dc to home after the BMP has returned.   Length of Stay: 1  Thayer Headings, Brooke Bonito., MD, [facility] 06/07/2014, 12:28 PM Office [phone] Pager [phone]

## 2014-06-08 DIAGNOSIS — I70245 Atherosclerosis of native arteries of left leg with ulceration of other part of foot: Secondary | ICD-10-CM | POA: Diagnosis not present

## 2014-06-08 LAB — GLUCOSE, CAPILLARY
GLUCOSE-CAPILLARY: 192 mg/dL — AB (ref 70–99)
Glucose-Capillary: 112 mg/dL — ABNORMAL HIGH (ref 70–99)
Glucose-Capillary: 133 mg/dL — ABNORMAL HIGH (ref 70–99)
Glucose-Capillary: 138 mg/dL — ABNORMAL HIGH (ref 70–99)

## 2014-06-08 MED ORDER — WARFARIN SODIUM 7.5 MG PO TABS
7.5000 mg | ORAL_TABLET | Freq: Once | ORAL | Status: AC
  Administered 2014-06-08: 7.5 mg via ORAL
  Filled 2014-06-08: qty 1

## 2014-06-08 MED ORDER — WARFARIN - PHYSICIAN DOSING INPATIENT: Freq: Every day | Status: DC

## 2014-06-08 NOTE — Progress Notes (Signed)
Inpatient Diabetes Program Recommendations  AACE/ADA: New Consensus Statement on Inpatient Glycemic Control  Target Ranges:  Prepandial:   less than 140 mg/dL      Peak postprandial:   less than 180 mg/dL (1-2 hours)      Critically ill patients:  140 - 180 mg/dL  Pager:  [phone] Hours:  8 am-10pm   Reason for Visit: Glucose 180-200s with oral agents.  Inpatient Diabetes Program Recommendations Correction (SSI): Add Novolog Correction  Courtney Heys PhD, RN, BC-ADM Diabetes Coordinator  Office:  [phone] Team Pager:  [phone]

## 2014-06-08 NOTE — Discharge Instructions (Signed)
Angiogram An angiogram, also called angiography, is a procedure used to look at the blood vessels that carry blood to different parts of your body (arteries). In this procedure, dye is injected through a long, thin tube (catheter) into an artery. X-rays are then taken. The X-rays will show if there is a blockage or problem in a blood vessel.  LET [facility] CARE PROVIDER KNOW ABOUT:  Any allergies you have, including allergies to shellfish or contrast dye.   All medicines you are taking, including vitamins, herbs, eye drops, creams, and over-the-counter medicines.   Previous problems you or members of your family have had with the use of anesthetics.   Any blood disorders you have.   Previous surgeries you have had.  Any previous kidney problems or failure you have had.  Medical conditions you have.   Possibility of pregnancy, if this applies. RISKS AND COMPLICATIONS Generally, an angiogram is a safe procedure. However, as with any procedure, problems can occur. Possible problems include:  Injury to the blood vessels, including rupture or bleeding.  Infection or bruising at the catheter site.  Allergic reaction to the dye or contrast used.  Kidney damage from the dye or contrast used.  Blood clots that can lead to a stroke or heart attack. BEFORE THE PROCEDURE  Do not eat or drink after midnight on the night before the procedure, or as directed by your health care provider.   Ask your health care provider if you may drink enough water to take any needed medicines the morning of the procedure.  PROCEDURE  You may be given a medicine to help you relax (sedative) before and during the procedure. This medicine is given through an IV access tube that is inserted into one of your veins.   The area where the catheter will be inserted will be washed and shaved. This is usually done in the groin but may be done in the fold of your arm (near your elbow) or in the wrist.  A  medicine will be given to numb the area where the catheter will be inserted (local anesthetic).  The catheter will be inserted with a guide wire into an artery. The catheter is guided by using a type of X-ray (fluoroscopy) to the blood vessel being examined.   Dye is then injected into the catheter, and X-rays are taken. The dye helps to show where any narrowing or blockages are located.  AFTER THE PROCEDURE   If the procedure is done through the leg, you will be kept in bed lying flat for several hours. You will be instructed to not bend or cross your legs.  The insertion site will be checked frequently.  The pulse in your feet or wrist will be checked frequently.  Additional blood tests, X-rays, and electrocardiography may be done.   You may need to stay in the hospital overnight for observation.  Document Released: 05/15/2005 Document Revised: 08/10/2013 Document Reviewed: 01/06/2013 [facility] Patient Information 2015 [place], [place]. This information is not intended to replace advice given to you by your health care provider. Make sure you discuss any questions you have with your health care provider.  Peripheral Vascular Disease  Peripheral vascular disease (PVD) is caused by cholesterol buildup in the arteries. The arteries become narrow or clogged. This makes it hard for blood to flow. It happens most in the legs, but it can occur in other areas of your body. HOME CARE   Quit smoking, if you smoke.  Exercise as told  by your doctor.  Follow a low-fat, low-cholesterol diet as told by your doctor.  Control your diabetes, if you have diabetes.  Care for your feet to prevent infection.  Only take medicine as told by your doctor. GET HELP RIGHT AWAY IF:   You have pain or lose feeling (numbness) in your arms or legs.  Your arms or legs turn cold or blue.  You have redness, warmth, and puffiness (swelling) in your arms or legs. MAKE SURE YOU:   Understand these  instructions.  Will watch your condition.  Will get help right away if you are not doing well or get worse. Document Released: 10/30/2009 Document Revised: 10/28/2011 Document Reviewed: 10/30/2009 [facility] Patient Information 2015 [place], [place]. This information is not intended to replace advice given to you by your health care provider. Make sure you discuss any questions you have with your health care provider.   Please take 7.5mg  Coumadin for 10/21 - 10/23, then resume your regular dose of coumadin on 10/24. Please check your PT/INR/Coumadin level next Monday 10/26

## 2014-06-08 NOTE — Progress Notes (Addendum)
PROGRESS NOTE  Subjective:   Pt is a 73 yo with hx of PVD Admitted overnight after a PV procedure because of hypoxemia and anxiety. Felt better after a bowel movement.  BMP was ok.Pt was to go home but he was not Dcd   Objective:    Vital Signs:   Temp:  [97.9 F (36.6 C)-98.5 F (36.9 C)] 98.1 F (36.7 C) (10/21 0747) Pulse Rate:  [54-63] 54 (10/21 0408) Resp:  [12-20] 18 (10/21 0800) BP: (120-159)/(34-54) 159/48 mmHg (10/21 0800) SpO2:  [94 %-99 %] 98 % (10/21 0747)  Last BM Date: 06/07/14   24-hour weight change: Weight change:   Weight trends: Filed Weights   06/06/14 0935 06/06/14 1545  Weight: 219 lb (99.338 kg) 210 lb 15.7 oz (95.7 kg)    Intake/Output:  10/20 0701 - 10/21 0700 In: 200 [P.O.:200] Out: 1726 [Urine:1725; Stool:1] Total I/O In: -  Out: 475 [Urine:475]   Physical Exam: BP 159/48  Pulse 54  Temp(Src) 98.1 F (36.7 C) (Oral)  Resp 18  Ht 5\' 10"  (1.778 m)  Wt 210 lb 15.7 oz (95.7 kg)  BMI 30.27 kg/m2  SpO2 98%  Wt Readings from Last 3 Encounters:  06/06/14 210 lb 15.7 oz (95.7 kg)  06/06/14 210 lb 15.7 oz (95.7 kg)  05/25/14 219 lb (99.338 kg)    General: Vital signs reviewed and noted.   Head: Normocephalic, atraumatic.  Eyes: conjunctivae/corneas clear.  EOM's intact.   Throat: normal  Neck:  normal   Lungs:    clear   Heart:  RR, occasional premature beats  Abdomen:  Soft, non-tender, non-distended    Extremities: Groin looks ok   Neurologic: A&O X3, CN II - XII are grossly intact.   Psych: Normal     Labs: BMET:  Recent Labs  06/07/14 1300  NA 139  K 4.4  CL 101  CO2 28  GLUCOSE 152*  BUN 30*  CREATININE 1.21  CALCIUM 9.8    Liver function tests: No results found for this basename: AST, ALT, ALKPHOS, BILITOT, PROT, ALBUMIN,  in the last 72 hours No results found for this basename: LIPASE, AMYLASE,  in the last 72 hours  CBC: No results found for this basename: WBC, NEUTROABS, HGB, HCT, MCV,  PLT,  in the last 72 hours  Cardiac Enzymes: No results found for this basename: CKTOTAL, CKMB, TROPONINI,  in the last 72 hours  Coagulation Studies:  Recent Labs  06/06/14 0929  LABPROT 17.2*  INR 1.39     Other results:  Tele:  NSR, frequent PVCs   Medications:    Infusions:    Scheduled Medications: . albuterol  4 mg Oral BID  . allopurinol  300 mg Oral QHS  . cephALEXin  500 mg Oral QID  . clopidogrel  75 mg Oral Q breakfast  . fenofibrate  54 mg Oral q morning - 10a  . furosemide  40 mg Oral Daily  . glimepiride  1 mg Oral BID  . isosorbide mononitrate  30 mg Oral Daily  . linagliptin  5 mg Oral Daily  . metoprolol succinate  50 mg Oral Daily  . nicotine  14 mg Transdermal Daily  . potassium chloride SA  20 mEq Oral Daily  . pregabalin  50 mg Oral BID  . ramipril  2.5 mg Oral QHS  . ramipril  5 mg Oral Daily  . rosuvastatin  20 mg Oral QHS    Assessment/ Plan:  1.   Critical lower limb ischemia / PAD   Pt had an OP procedure yesterday - had some mild complications - anxiety and hypoxemia and was observed overnight. Doing well today Ready for DC  OK to go home Restart coumadin Continue all his home meds.   2.  CAD:  No angina   3.  COPD  - needs to stop smoking   4.  Hyperlipidemia  5. Diabetes Mellitus:  Continue meds   6. St. Jude Aortic valve>  Will give a dose of coumadin 7.5 mg now. 7.5 mg tomorrow Then resume his normal dose. 7.5 mg Friday INR in Monday   Disposition: Dc to home    Length of Stay: 2  Ramond Dial., MD, [facility] 06/08/2014, 11:59 AM Office [phone] Pager [phone]

## 2014-06-08 NOTE — Progress Notes (Signed)
Noted order to DC patient home.DC instructions given and patient and patient daughter  verbalized understanding. Patient escorted out stable without any complains.

## 2014-06-08 NOTE — Discharge Summary (Signed)
Discharge Summary   Patient ID: Tanner Pena,  MRN: [id number], DOB/AGE: Jul 01, 1941 73 y.o.  Admit date: 06/06/2014 Discharge date: 06/08/2014  Primary Care Provider: [name] Primary Cardiologist: Dr. [name] Dr. [name] (PVD)   Discharge Diagnoses Principal Problem:   Critical lower limb ischemia Active Problems:   Long term current use of anticoagulant therapy   H/O aortic valve replacement- St Jude   CAD - CABG '90 with re do '94    Tobacco abuse   PVD prior SFA PTA with chronic LE disease, not amenable to PTA   DM2 (diabetes mellitus, type 2)   Hyperlipidemia with target LDL less than 70   Cardiomyopathy, ischemic - EF 25-30% by echo 5/14   COPD (chronic obstructive pulmonary disease)   LBBB (left bundle branch block)   HTN (hypertension)   Chronic toe ulcer   Allergies No Known Allergies  Procedures  Cardiac catheterization 95/62/1308 AO SYSTOLIC/AO DIASTOLIC: 657/84  Angiographic Data:  1: Abdominal aorta-the distal abdominal aorta was fluoroscopically calcified but free of significant disease  2: Left lower extremity-the left iliac system was fluoroscopically calcified. There was a 90% calcified exophytic plaque at the origin of the left profunda femoris. There is 50% left SFA ostial stenosis. The entire stented segment in the mid left SFA was patent with moderate calcified disease just distal to this. There is 1 vessel runoff via the perineal which was a diffusely diseased vessel and terminated at the level of the ankle. The peroneal and anterior tibial arteries were occluded.  IMPRESSION:[name] has critical limb ischemia with one-vessel runoff only to the ankle via posterior tibial artery that has diffuse high-grade disease. I believe that percutaneous intervention would be difficult at best. I suspect his posterior tibial artery throughout its entirety could be atherectomized and dilated though I am unsure whether to the continuation below the ankle  is revascularizable. The patient became hypoxic and anxious during the case with dusky looking lips and a sat of 87% despite being on 2 L of nasal oxygen. He became somewhat hypotensive as well. At the termination of the case the patient reported that he needed to have a bowel movement which was "excessive" in which resulted in marked improvement in his constitutional state. The catheter was then removed, the sheath was secured and the patient left the lab in stable condition. The sheath will be removed and pressure will be held. I will observe him overnight and step down and will restart his Coumadin at discharge. I will see him back in the office and refer him to Dr. Brunetta Jeans for consideration of pedal access.     Hospital Course  The patient is a 73 year old male with past medical history of hypertension, diabetes, hyperlipidemia, obstructive sleep apnea, COPD, history of coronary artery disease, and a history of peripheral vascular disease. He has a history of CAD status post bypass grafting in 1990 with a St. Jude AVR at that time. He had redo CABG in 1994. Patient had history of bilateral SFA stents as well as rotational atherectomy of his right external iliac artery. He was seen by Dr. 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 His was instructed to hold Coumadin for his St. Jude aortic valve prior to his angiogram.   Patient underwent a scheduled peripheral vascular angiogram on  06/06/2014 which showed calcified abdominal aorta, however free of significant significant disease. Left lower extremity angiogram showed 90% calcified plaque at the origin of the left profunda femoris, 50% left SFA ostial stenosis, patent stent in mid left SFA with moderate calcified disease distal to this. There  is one vessel runoff via peroneal artery which was diffusely diseased. The peroneal and the anterior tibial artery were occluded. It was felt due to critical limb ischemia with one vessel run off below ankle via posterior tibial artery and diffuse high-grade disease, percutaneous intervention would be difficult. Although his posterior tibial artery could be atherectomized and dilated, however it is unclear whether the area below ankle is revascularizable. During the case, patient became hypoxic and anxious with drop of O2 saturation down to 87% despite on 2 L nasal oxygen. Post procedure, patient was kept overnight for observation. His Coumadin has been restarted.  He was seen the morning of 06/08/2014, patient appears to be doing well. The Coumadin has been restarted. He is deemed stable for discharge from cardiology perspective. He will need increased dose of Coumadin 7.5 mg from 10/21 until 10/23, then he will resume his normal dose after discharge. He will need PT/INR check in clinic on Monday. I have left message with our nothline scheduler to contact patient for followup. Per Dr. Kennon Holter note, he will followup with Dr. [name] who will refer him to Dr. Brunetta Jeans for consideration of pedal access.   Discharge Vitals Blood pressure 148/35, pulse 54, temperature 97.7 F (36.5 C), temperature source Oral, resp. rate 17, height 5\' 10"  (1.778 m), weight 210 lb 15.7 oz (95.7 kg), SpO2 96.00%.  Filed Weights   06/06/14 0935 06/06/14 1545  Weight: 219 lb (99.338 kg) 210 lb 15.7 oz (95.7 kg)    Labs  CBC No results found for this basename: WBC, NEUTROABS, HGB, HCT, MCV, PLT,  in the last 72 hours Basic Metabolic Panel  Recent Labs  06/07/14 1300  NA 139  K 4.4  CL 101  CO2 28  GLUCOSE 152*  BUN 30*  CREATININE 1.21  CALCIUM 9.8    Disposition  Pt is being discharged home today in good condition.  Follow-up Plans & Appointments      Follow-up Information   Follow up with  [name], MD. (Office will call you to schedule followup. Please call us if you do not hear from [place] in 2 business days)    Specialty:  Cardiology   Contact information:   [address] [place] [address] [phone]       Please follow up. (Please check PT/INR next Monday 10/26)       Discharge Medications    Medication List         albuterol 4 MG 12 hr tablet  Commonly known as:  VOSPIRE ER  Take 4 mg by mouth 2 (two) times daily.     albuterol-ipratropium 18-103 MCG/ACT inhaler  Commonly known as:  COMBIVENT  Inhale 1 puff into the lungs 4 (four) times daily. Coughing/ Shortness of Breath     allopurinol 300 MG tablet  Commonly known as:  ZYLOPRIM  Take 300 mg by mouth at bedtime.     cephALEXin 500 MG capsule  Commonly known as:  KEFLEX  Take 500 mg by mouth 4 (four) times daily.     clopidogrel 75 MG tablet  Commonly known as:  PLAVIX  Take 1 tablet (75 mg total) by mouth daily with breakfast.  fenofibrate 145 MG tablet  Commonly known as:  TRICOR  Take 1 tablet (145 mg total) by mouth daily.     fish oil-omega-3 fatty acids 1000 MG capsule  Take 1 capsule (1 g total) by mouth 2 (two) times daily.     furosemide 80 MG tablet  Commonly known as:  LASIX  Take 40 mg by mouth daily.     glimepiride 1 MG tablet  Commonly known as:  AMARYL  Take 1 mg by mouth 2 (two) times daily.     IODINE EX  Apply 1 application topically 2 (two) times daily.     isosorbide mononitrate 30 MG 24 hr tablet  Commonly known as:  IMDUR  Take 1 tablet (30 mg total) by mouth daily.     metoprolol succinate 50 MG 24 hr tablet  Commonly known as:  TOPROL-XL  Take 1 tablet (50 mg total) by mouth daily. Take with or immediately following a meal.     nicotine 14 mg/24hr patch  Commonly known as:  NICODERM CQ - dosed in mg/24 hours  Place 1 patch (14 mg total) onto the skin daily.     nitroGLYCERIN 0.4 MG SL tablet  Commonly known as:  NITROSTAT    Place 1 tablet (0.4 mg total) under the tongue every 5 (five) minutes as needed for chest pain.     oxyCODONE-acetaminophen 10-325 MG per tablet  Commonly known as:  PERCOCET  Take 1 tablet by mouth every 3 (three) hours as needed for pain.     potassium chloride SA 20 MEQ tablet  Commonly known as:  K-DUR,KLOR-CON  Take 20 mEq by mouth daily.     pregabalin 50 MG capsule  Commonly known as:  LYRICA  Take 50 mg by mouth 2 (two) times daily.     ramipril 5 MG capsule  Commonly known as:  ALTACE  Take 2.5-5 mg by mouth 2 (two) times daily. Take 5 mg by mouth in the morning and 2.5 mg by mouth at bedtime.     rosuvastatin 20 MG tablet  Commonly known as:  CRESTOR  Take 20 mg by mouth at bedtime.     sitaGLIPtin 50 MG tablet  Commonly known as:  JANUVIA  Take 50 mg by mouth daily.     warfarin 5 MG tablet  Commonly known as:  COUMADIN  Take 1-1.5 tablets (5-7.5 mg total) by mouth at bedtime. Takes one and one-half tablet (7.5mg  total) on Mondays and Fridays. Takes one tablet (5mg  total) on all other days        Outstanding Labs/Studies  PT/INR next Monday  Duration of Discharge Encounter   Greater than 30 minutes including physician time.  Hilbert Corrigan PA-C Pager: [phone] 06/08/2014, 1:48 PM   Attending Note:   The patient was seen and examined.  Agree with assessment and plan as noted above.  Changes made to the above note as needed.  Pt is ready for DC. Stable after cath   Ramond Dial., MD, [facility] 06/08/2014, 3:31 PM [address],  [place] Pager [phone]

## 2014-06-09 ENCOUNTER — Other Ambulatory Visit: Payer: Self-pay | Admitting: *Deleted

## 2014-06-09 DIAGNOSIS — I739 Peripheral vascular disease, unspecified: Secondary | ICD-10-CM

## 2014-06-09 DIAGNOSIS — I6529 Occlusion and stenosis of unspecified carotid artery: Secondary | ICD-10-CM

## 2014-06-10 ENCOUNTER — Telehealth: Payer: Self-pay | Admitting: Cardiovascular Disease

## 2014-06-10 NOTE — Telephone Encounter (Signed)
Heather called in stating that Dr.Berry wanted Dr. Brunetta Jeans to perform and Angiogram on the pt but she stated that she needed some records.   I spoke with Curt Bears and she stated that she sent a CD to the [place] office.  I called Heather back and left her a VM of the information that Curt Bears told me. And if she had any further questions to give me a call back at the office

## 2014-06-10 NOTE — Telephone Encounter (Signed)
Message sent to Brita Romp RN.

## 2014-06-13 ENCOUNTER — Ambulatory Visit (INDEPENDENT_AMBULATORY_CARE_PROVIDER_SITE_OTHER): Payer: PRIVATE HEALTH INSURANCE | Admitting: Pharmacist Clinician (PhC)/ Clinical Pharmacy Specialist

## 2014-06-13 ENCOUNTER — Telehealth: Payer: Self-pay | Admitting: Cardiovascular Disease

## 2014-06-13 DIAGNOSIS — Z7901 Long term (current) use of anticoagulants: Secondary | ICD-10-CM

## 2014-06-13 DIAGNOSIS — Z952 Presence of prosthetic heart valve: Secondary | ICD-10-CM

## 2014-06-13 DIAGNOSIS — Z954 Presence of other heart-valve replacement: Secondary | ICD-10-CM

## 2014-06-13 NOTE — Telephone Encounter (Signed)
Tanner Pena is calling to fiond out if Dr.Berry has heard anything from the provider in [place] about her father's leg. Stating that her father is in so much pain and if he has not heard anything from the doctor in [place] he wants to go ahead with the amputation .Please Call.[name]

## 2014-06-13 NOTE — Telephone Encounter (Signed)
I don't think he needs to be approached during his procedure.

## 2014-06-13 NOTE — Telephone Encounter (Signed)
Dr Berry, please advise 

## 2014-06-13 NOTE — Telephone Encounter (Signed)
FORWARD TO Quentin Mulling RN

## 2014-06-13 NOTE — Telephone Encounter (Signed)
I just spoke with Nira Conn from Dr Baruch Merl office.  She wanted to verify that Mr Jafri could hold his coumadin prior to the angio with Dr Andree Elk without lovenox bridging.  Dr [name] did not require bridging prior to the angio that we did on him.  I will verify with Dr [name].

## 2014-06-14 NOTE — Telephone Encounter (Signed)
Verbal clarification- Dr [name] doesn't think Tanner Pena needs to be bridged.

## 2014-06-21 ENCOUNTER — Telehealth: Payer: Self-pay | Admitting: Cardiovascular Disease

## 2014-06-21 NOTE — Telephone Encounter (Signed)
Amber called in wanting to speak with Dr.Berry about the ulcers in the bottom of his foot. He is in extreme pain. She said that the infection has spread and would like to know what the next steps should be. Please call  Thanks

## 2014-06-21 NOTE — Telephone Encounter (Signed)
Start Augmentin twice a day. Keep appointment with Dr. Andree Elk for hope of revascularization.

## 2014-06-21 NOTE — Telephone Encounter (Signed)
Dr Berry, please advise 

## 2014-06-21 NOTE — Telephone Encounter (Signed)
Returned call to [employer] (daughter). Dr. [name] had referred her dad to Dr. Brunetta Jeans (has appointment on 11/10). She reports that his symptoms have worsened - his left 2nd toe and left great toe have become more infected, red, swollen and it appears to be moving up his toes. His foot is oozing pus. She reports that it looks like his foot could just fall off and he is in a lot of pain. She wants to know what can be done.. And if Dr. [name] thinks that Dr. Andree Elk will in fact be able to complete an intervention and if not, they are ready to move on to other options (i.e. amputation?).   Will defer to Dr. [name] and Curt Bears, RN to advise  She would like a call about this today, if possible

## 2014-06-22 MED ORDER — CEPHALEXIN 500 MG PO CAPS: 500.0000 mg | ORAL_CAPSULE | Freq: Two times a day (BID) | ORAL | Status: DC

## 2014-06-22 NOTE — Addendum Note (Signed)
Addended byChauncy Lean. on: 06/22/2014 02:33 PM   Modules accepted: Orders

## 2014-06-22 NOTE — Telephone Encounter (Signed)
I spoke with Dr [name].  He gave a verbal order to continue the Keflex that patient is currently taking.  Dr [name] wants Mr Vonbargen to take the Keflex until he is seen by Dr Andree Elk.  Dr [name] also spoke with Dr Andree Elk about moving up the procedure.  Dr Andree Elk will reach out to the patient. Amber aware.

## 2014-06-24 ENCOUNTER — Telehealth: Payer: Self-pay | Admitting: Cardiovascular Disease

## 2014-06-24 NOTE — Telephone Encounter (Signed)
Spoke with Nira Conn with Dr. Brunetta Jeans office. Patient is to have angiogram on Monday. They wanted clarification on if patient needed lovenox bridge - he is to hold coumadin x5 days prior.   Clarified with Curt Bears, RN that he is NOT to be bridged. Documentation of this in EPIC is faxed to [facility] @ [phone]

## 2014-06-28 ENCOUNTER — Telehealth: Payer: Self-pay | Admitting: Cardiovascular Disease

## 2014-06-28 ENCOUNTER — Telehealth: Payer: Self-pay | Admitting: Pharmacist Clinician (PhC)/ Clinical Pharmacy Specialist

## 2014-06-28 DIAGNOSIS — I739 Peripheral vascular disease, unspecified: Secondary | ICD-10-CM

## 2014-06-28 DIAGNOSIS — S91109D Unspecified open wound of unspecified toe(s) without damage to nail, subsequent encounter: Secondary | ICD-10-CM

## 2014-06-28 NOTE — Telephone Encounter (Signed)
She wanted you to know he saw Dr Levin Erp in [place] and everything went well. He now needs a referral for wound care treatment and to have his toe amputated.

## 2014-06-28 NOTE — Telephone Encounter (Signed)
Pts Daughter, [name] calling to make Dr [name] and nurse aware that the pt went to see Dr Andree Elk in [place], as referred by Dr [name], and everything went well.  Daughter Museum/gallery conservator states that Dr Andree Elk was able to open up 2 veins in the pts leg and so all he will require now is a toe amputation, vs a BKA.  Daughter states that she called Dr Baruch Merl office and requested them fax over pts records to Dr [name] for further review.  Daughter would like for Dr [name] to review what Dr Baruch Merl did for the pt and further advise on what the next step is with having the pts toe amputated.  Daughter requesting Dr [name] refer pt to the appropriate surgeon for toe amputation, and requesting an order for someone to come out and provide the pt wound care in the home. Daughter states the pts toe is gangrene and needs to be removed. Informed the daughter that I will route this message to Dr [name] and covering nurse for further review and recommendation, then they will follow-up thereafter.  Daughter verbalized understanding and agrees with this plan.

## 2014-06-29 ENCOUNTER — Ambulatory Visit: Payer: PRIVATE HEALTH INSURANCE | Admitting: Physician Assistant

## 2014-06-29 NOTE — Telephone Encounter (Signed)
H for home health care and referral to [facility]  for potential amputation

## 2014-06-29 NOTE — Telephone Encounter (Signed)
Spoke with [employer], pt to take 7.5mg  x 2 days then resume previous dose, repeat INR next week.  Dtr voiced understanding

## 2014-06-30 ENCOUNTER — Other Ambulatory Visit: Payer: Self-pay | Admitting: *Deleted

## 2014-06-30 NOTE — Telephone Encounter (Signed)
I spoke with patient's family member Advertising account planner) and made her aware of Dr Kennon Holter recommendations.  I have placed a referral to Dr Sharol Given and to [facility] for wound care.  Museum/gallery conservator is Patent attorney.   I personally called Dr Jess Barters office and made the referral.

## 2014-07-01 ENCOUNTER — Other Ambulatory Visit: Payer: Self-pay | Admitting: Pharmacist Clinician (PhC)/ Clinical Pharmacy Specialist

## 2014-07-01 MED ORDER — RAMIPRIL 5 MG PO CAPS: ORAL_CAPSULE | ORAL | Status: DC

## 2014-07-01 MED ORDER — RAMIPRIL 2.5 MG PO CAPS: ORAL_CAPSULE | ORAL | Status: DC

## 2014-07-01 NOTE — Telephone Encounter (Signed)
Patient reported that the pharmacy provided 2.5mg  and 5mg  capsules. He also reports taking 1, 5 mg capsule by mouth in the morning and 1, 2.5 mg capsule by mouth at bedtime. Two separate scripts were sent electronically to the pharmacy.

## 2014-07-02 LAB — PROTIME-INR
INR: 1.79 — AB (ref <1.50)
Prothrombin Time: 20.8 seconds — ABNORMAL HIGH (ref 11.6–15.2)

## 2014-07-04 ENCOUNTER — Ambulatory Visit (INDEPENDENT_AMBULATORY_CARE_PROVIDER_SITE_OTHER): Payer: PRIVATE HEALTH INSURANCE | Admitting: Pharmacist Clinician (PhC)/ Clinical Pharmacy Specialist

## 2014-07-04 DIAGNOSIS — Z954 Presence of other heart-valve replacement: Secondary | ICD-10-CM

## 2014-07-04 DIAGNOSIS — Z7901 Long term (current) use of anticoagulants: Secondary | ICD-10-CM

## 2014-07-04 DIAGNOSIS — Z952 Presence of prosthetic heart valve: Secondary | ICD-10-CM

## 2014-07-06 ENCOUNTER — Other Ambulatory Visit (HOSPITAL_COMMUNITY): Payer: Self-pay | Admitting: Orthopedic Surgery

## 2014-07-12 ENCOUNTER — Encounter (HOSPITAL_COMMUNITY): Payer: Self-pay | Admitting: *Deleted

## 2014-07-12 MED ORDER — CEFAZOLIN SODIUM-DEXTROSE 2-3 GM-% IV SOLR
2.0000 g | INTRAVENOUS | Status: AC
  Administered 2014-07-13: 2 g via INTRAVENOUS
  Filled 2014-07-12: qty 50

## 2014-07-12 NOTE — Progress Notes (Signed)
Anesthesia Chart Review:  Patient is a 73 year old male posted for left TMA on 07/13/14 by Dr. Sharol Given.  He is posted as a same day work-up.  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 Brunetta Jeans, HTN, hypercholesterolemia, recent former smoker, COPD, OSA, SOB, dysphagia, chronic back pain, gout. Patient reported that he is to continue Plavix and Coumadin perioperatively. PCP is Dr. Gerarda Fraction. Primary cardiologist is Dr. [name]. He also sees cardiologist Dr. [name] for PAD. Dr. [name] referred patient to Dr. Sharol Given for potential amputation.  EKG 05/25/14: SR with first degree AVB, occasional PVC, left BBB.  Cardiac cath 05/11/14 (done for unstable angina with negative troponins; Dr. Angelena Form):  Impression: 1. Triple vessel CAD s/p 6V CABG with 6/6 patent grafts 2. Unstable angina secondary to severe restenosis in the stented segment of the SVG to RCA 3. Respiratory failure during the diagnostic cath requiring intubation/mechanical ventilation 4. Hypotension following intubation due to paralytic agents/sedatives.  Recommendations: His vein graft to the RCA will require PCI with coverage of the entire stented segment of the vein graft from the ostium down into the mid body of the vein graft. Given his respiratory failure, presumably from volume overload, and hypotension with sedative medications as well as inability to give oral anti-platelet agents, will delay PCI of the SVG to the RCA. PCCM has assisted in the cath lab with ventilator management. Hopefully can extubate in am and then be loaded with oral anti-platelet agents. PCI  of the SVG to RCA can be planned when he is extubated and hemodynamically stable following anti-platelet loading. Would start IV heparin 8 hours post sheath pull. With need for long term coumadin for mechanical AVR, would use Plavix as the oral anti-platelet agent.   Complications: Respiratory failure due to presumed volume overload, hypotension due to sedative agents following intubation. No cardiac complications.  INTERVENTION 05/13/14 (Dr. Irish Lack): Successful PCI of the SVG to right coronary artery. A 2.75 x 15, 2.75 x 23 and 2.7 x 28 were placed in overlapping fashion from the proximal to mid graft. The entire stented segment was post dilated up to 3.6 mm in diameter. Distal embolic protection was used with successful trapping of debris. Recommend stop ASA and leave on Coumadin and Plavix. Continue aggressive medical therapy.  05/08/14 Echo: - Left ventricle: The cavity size was at the upper limits of normal. Wall thickness was increased in a pattern of moderate LVH. Systolic function was mildly reduced. The estimated ejection fraction was in the range of 45% to 50%. Left ventricular diastolic function parameters were normal. - Aortic valve: A mechanical prosthesis was present. There was moderate stenosis. Valve area (Vmax): 4.01 cm^2. - Left atrium: The atrium was moderately dilated. - Right atrium: The atrium was moderately dilated.  08/13/13 Nuclear stress test: No stress-induced ischemia. Fixed defect as described involving the inferior wall extending into the apex and anteroseptal region. EF 30%.  Carotid duplex 06/01/14: Bilateral CCA with moderate fibrous plaque but no evidence of significant diameter reduction. 0-49% right bulb/ICA diameter reduction. 50-69% left bulb/ICA diameter reduction. Right ECA > 70% diameter reduction. Left SCA velocities suggest  50-69% diameter reduction. Left vertebral known occlusive disease.  He is for labs on arrival.    Patient with a significant  cardiac history and recent DES 04/2014 and known St. Jude AVR.  However, cardiologist Dr. [name] referred patient to Dr. Sharol Given for amputation.  Patient is remaining on Coumadin and Plavix. Of note, he had respiratory issues during two of his last three "angiograms"  On 06/06/14, he became anxious, hypoxic (87% of 2L/Brave), and somewhat hypotensive during a PV case.  He symptoms improved after a large post-procedure BM, but he was observed overnight.  On 05/11/14, he required intubation during a cardiac cath due to respiratory failure presumed secondary to volume overload.  This case is posted for general anesthesia.  He will be further assessed by his assigned anesthesiologist on the day of surgery.  George Hugh [facility] Short Stay Center/Anesthesiology Phone [phone] 07/12/2014 11:43 AM

## 2014-07-12 NOTE — Progress Notes (Signed)
   07/12/14 1132  OBSTRUCTIVE SLEEP APNEA  Have you ever been diagnosed with sleep apnea through a sleep study? Yes (yes years ago, does not wear CPAP)  If yes, do you have and use a CPAP or BPAP machine every night? 0  Do you snore loudly (loud enough to be heard through closed doors)?  0  Do you often feel tired, fatigued, or sleepy during the daytime? 1  Has anyone observed you stop breathing during your sleep? 1  Do you have, or are you being treated for high blood pressure? 1  BMI more than 35 kg/m2? 0  Age over 34 years old? 1  Gender: 1  Obstructive Sleep Apnea Score 5  Score 4 or greater  Results sent to PCP

## 2014-07-13 ENCOUNTER — Ambulatory Visit (HOSPITAL_COMMUNITY): Payer: PRIVATE HEALTH INSURANCE | Admitting: Vascular Surgery

## 2014-07-13 ENCOUNTER — Encounter (HOSPITAL_COMMUNITY)
Admission: RE | Disposition: A | Discharge status: Home / Self Care | Payer: Self-pay | Source: Ambulatory Visit | Attending: Orthopedic Surgery

## 2014-07-13 ENCOUNTER — Ambulatory Visit (HOSPITAL_COMMUNITY)
Admission: RE | Admit: 2014-07-13 | Discharge: 2014-07-14 | Disposition: A | Discharge status: Home / Self Care | Payer: PRIVATE HEALTH INSURANCE | Source: Ambulatory Visit | Attending: Orthopedic Surgery | Admitting: Orthopedic Surgery

## 2014-07-13 DIAGNOSIS — J449 Chronic obstructive pulmonary disease, unspecified: Secondary | ICD-10-CM | POA: Diagnosis not present

## 2014-07-13 DIAGNOSIS — I959 Hypotension, unspecified: Secondary | ICD-10-CM | POA: Diagnosis not present

## 2014-07-13 DIAGNOSIS — I96 Gangrene, not elsewhere classified: Secondary | ICD-10-CM | POA: Diagnosis not present

## 2014-07-13 DIAGNOSIS — I10 Essential (primary) hypertension: Secondary | ICD-10-CM | POA: Insufficient documentation

## 2014-07-13 DIAGNOSIS — E780 Pure hypercholesterolemia: Secondary | ICD-10-CM | POA: Diagnosis not present

## 2014-07-13 DIAGNOSIS — I252 Old myocardial infarction: Secondary | ICD-10-CM | POA: Diagnosis not present

## 2014-07-13 DIAGNOSIS — K219 Gastro-esophageal reflux disease without esophagitis: Secondary | ICD-10-CM | POA: Insufficient documentation

## 2014-07-13 DIAGNOSIS — G629 Polyneuropathy, unspecified: Secondary | ICD-10-CM | POA: Insufficient documentation

## 2014-07-13 DIAGNOSIS — I509 Heart failure, unspecified: Secondary | ICD-10-CM | POA: Diagnosis not present

## 2014-07-13 DIAGNOSIS — Z87891 Personal history of nicotine dependence: Secondary | ICD-10-CM | POA: Diagnosis not present

## 2014-07-13 DIAGNOSIS — J9690 Respiratory failure, unspecified, unspecified whether with hypoxia or hypercapnia: Secondary | ICD-10-CM | POA: Diagnosis not present

## 2014-07-13 DIAGNOSIS — G8929 Other chronic pain: Secondary | ICD-10-CM | POA: Insufficient documentation

## 2014-07-13 DIAGNOSIS — L97529 Non-pressure chronic ulcer of other part of left foot with unspecified severity: Secondary | ICD-10-CM | POA: Diagnosis not present

## 2014-07-13 DIAGNOSIS — I2510 Atherosclerotic heart disease of native coronary artery without angina pectoris: Secondary | ICD-10-CM | POA: Diagnosis not present

## 2014-07-13 DIAGNOSIS — Z952 Presence of prosthetic heart valve: Secondary | ICD-10-CM | POA: Diagnosis not present

## 2014-07-13 DIAGNOSIS — Z951 Presence of aortocoronary bypass graft: Secondary | ICD-10-CM | POA: Insufficient documentation

## 2014-07-13 DIAGNOSIS — I739 Peripheral vascular disease, unspecified: Secondary | ICD-10-CM | POA: Insufficient documentation

## 2014-07-13 DIAGNOSIS — E1340 Other specified diabetes mellitus with diabetic neuropathy, unspecified: Secondary | ICD-10-CM | POA: Insufficient documentation

## 2014-07-13 DIAGNOSIS — M109 Gout, unspecified: Secondary | ICD-10-CM | POA: Insufficient documentation

## 2014-07-13 DIAGNOSIS — Z794 Long term (current) use of insulin: Secondary | ICD-10-CM | POA: Diagnosis not present

## 2014-07-13 DIAGNOSIS — I6523 Occlusion and stenosis of bilateral carotid arteries: Secondary | ICD-10-CM | POA: Insufficient documentation

## 2014-07-13 DIAGNOSIS — I7389 Other specified peripheral vascular diseases: Secondary | ICD-10-CM | POA: Diagnosis not present

## 2014-07-13 DIAGNOSIS — M549 Dorsalgia, unspecified: Secondary | ICD-10-CM | POA: Insufficient documentation

## 2014-07-13 DIAGNOSIS — G4730 Sleep apnea, unspecified: Secondary | ICD-10-CM | POA: Insufficient documentation

## 2014-07-13 DIAGNOSIS — I25710 Atherosclerosis of autologous vein coronary artery bypass graft(s) with unstable angina pectoris: Secondary | ICD-10-CM | POA: Diagnosis not present

## 2014-07-13 DIAGNOSIS — Z89439 Acquired absence of unspecified foot: Secondary | ICD-10-CM

## 2014-07-13 HISTORY — PX: AMPUTATION: SHX166

## 2014-07-13 HISTORY — DX: Acute myocardial infarction, unspecified: I21.9

## 2014-07-13 HISTORY — DX: Unspecified osteoarthritis, unspecified site: M19.90

## 2014-07-13 HISTORY — DX: Gastro-esophageal reflux disease without esophagitis: K21.9

## 2014-07-13 LAB — COMPREHENSIVE METABOLIC PANEL
ALBUMIN: 3.6 g/dL (ref 3.5–5.2)
ALK PHOS: 25 U/L — AB (ref 39–117)
ALT: 8 U/L (ref 0–53)
AST: 15 U/L (ref 0–37)
Anion gap: 12 (ref 5–15)
BILIRUBIN TOTAL: 0.3 mg/dL (ref 0.3–1.2)
BUN: 34 mg/dL — ABNORMAL HIGH (ref 6–23)
CHLORIDE: 103 meq/L (ref 96–112)
CO2: 24 meq/L (ref 19–32)
Calcium: 9.8 mg/dL (ref 8.4–10.5)
Creatinine, Ser: 1.47 mg/dL — ABNORMAL HIGH (ref 0.50–1.35)
GFR calc Af Amer: 53 mL/min — ABNORMAL LOW (ref >90)
GFR, EST NON AFRICAN AMERICAN: 46 mL/min — AB (ref >90)
Glucose, Bld: 149 mg/dL — ABNORMAL HIGH (ref 70–99)
POTASSIUM: 4.2 meq/L (ref 3.7–5.3)
Sodium: 139 mEq/L (ref 137–147)
Total Protein: 7 g/dL (ref 6.0–8.3)

## 2014-07-13 LAB — CBC
HCT: 37.3 % — ABNORMAL LOW (ref 39.0–52.0)
Hemoglobin: 11.9 g/dL — ABNORMAL LOW (ref 13.0–17.0)
MCH: 28.1 pg (ref 26.0–34.0)
MCHC: 31.9 g/dL (ref 30.0–36.0)
MCV: 88.2 fL (ref 78.0–100.0)
PLATELETS: 278 10*3/uL (ref 150–400)
RBC: 4.23 MIL/uL (ref 4.22–5.81)
RDW: 14.4 % (ref 11.5–15.5)
WBC: 10.2 10*3/uL (ref 4.0–10.5)

## 2014-07-13 LAB — GLUCOSE, CAPILLARY
GLUCOSE-CAPILLARY: 140 mg/dL — AB (ref 70–99)
Glucose-Capillary: 130 mg/dL — ABNORMAL HIGH (ref 70–99)
Glucose-Capillary: 128 mg/dL — ABNORMAL HIGH (ref 70–99)
Glucose-Capillary: 122 mg/dL — ABNORMAL HIGH (ref 70–99)

## 2014-07-13 LAB — APTT: APTT: 46 s — AB (ref 24–37)

## 2014-07-13 LAB — PROTIME-INR
INR: 3.14 — ABNORMAL HIGH (ref 0.00–1.49)
Prothrombin Time: 32.6 seconds — ABNORMAL HIGH (ref 11.6–15.2)

## 2014-07-13 SURGERY — AMPUTATION, FOOT, RAY
Anesthesia: Monitor Anesthesia Care | Laterality: Left

## 2014-07-13 MED ORDER — FENTANYL CITRATE 0.05 MG/ML IJ SOLN
INTRAMUSCULAR | Status: AC
  Filled 2014-07-13: qty 5

## 2014-07-13 MED ORDER — CLOPIDOGREL BISULFATE 75 MG PO TABS
75.0000 mg | ORAL_TABLET | Freq: Every day | ORAL | Status: DC
  Administered 2014-07-13 – 2014-07-14 (×2): 75 mg via ORAL
  Filled 2014-07-13 (×3): qty 1

## 2014-07-13 MED ORDER — POTASSIUM CHLORIDE CRYS ER 20 MEQ PO TBCR
20.0000 meq | EXTENDED_RELEASE_TABLET | Freq: Two times a day (BID) | ORAL | Status: DC
  Administered 2014-07-14: 20 meq via ORAL
  Filled 2014-07-13 (×2): qty 1

## 2014-07-13 MED ORDER — CEFAZOLIN SODIUM 1-5 GM-% IV SOLN
1.0000 g | Freq: Four times a day (QID) | INTRAVENOUS | Status: DC
  Administered 2014-07-13 – 2014-07-14 (×2): 1 g via INTRAVENOUS
  Filled 2014-07-13 (×4): qty 50

## 2014-07-13 MED ORDER — OXYCODONE HCL 5 MG PO TABS
5.0000 mg | ORAL_TABLET | ORAL | Status: DC | PRN
  Administered 2014-07-13 – 2014-07-14 (×4): 10 mg via ORAL
  Filled 2014-07-13 (×4): qty 2

## 2014-07-13 MED ORDER — SODIUM CHLORIDE 0.9 % IV SOLN: INTRAVENOUS | Status: DC

## 2014-07-13 MED ORDER — IPRATROPIUM-ALBUTEROL 20-100 MCG/ACT IN AERS: 1.0000 | INHALATION_SPRAY | Freq: Four times a day (QID) | RESPIRATORY_TRACT | Status: DC

## 2014-07-13 MED ORDER — MIDAZOLAM HCL 5 MG/5ML IJ SOLN
INTRAMUSCULAR | Status: DC | PRN
  Administered 2014-07-13: 1 mg via INTRAVENOUS
  Administered 2014-07-13 (×2): 0.5 mg via INTRAVENOUS

## 2014-07-13 MED ORDER — LACTATED RINGERS IV SOLN
INTRAVENOUS | Status: DC | PRN
  Administered 2014-07-13: 08:00:00 via INTRAVENOUS

## 2014-07-13 MED ORDER — METHOCARBAMOL 1000 MG/10ML IJ SOLN
500.0000 mg | Freq: Four times a day (QID) | INTRAVENOUS | Status: DC | PRN
  Filled 2014-07-13: qty 5

## 2014-07-13 MED ORDER — WARFARIN SODIUM 5 MG PO TABS
5.0000 mg | ORAL_TABLET | ORAL | Status: AC
  Administered 2014-07-13: 5 mg via ORAL
  Filled 2014-07-13: qty 1

## 2014-07-13 MED ORDER — PREGABALIN 50 MG PO CAPS
50.0000 mg | ORAL_CAPSULE | Freq: Two times a day (BID) | ORAL | Status: DC
  Administered 2014-07-13 – 2014-07-14 (×2): 50 mg via ORAL
  Filled 2014-07-13 (×2): qty 1

## 2014-07-13 MED ORDER — RAMIPRIL 5 MG PO CAPS
5.0000 mg | ORAL_CAPSULE | Freq: Every day | ORAL | Status: DC
  Administered 2014-07-14: 5 mg via ORAL
  Filled 2014-07-13: qty 1

## 2014-07-13 MED ORDER — FENTANYL CITRATE 0.05 MG/ML IJ SOLN
INTRAMUSCULAR | Status: DC | PRN
  Administered 2014-07-13: 50 ug via INTRAVENOUS

## 2014-07-13 MED ORDER — ALLOPURINOL 300 MG PO TABS
300.0000 mg | ORAL_TABLET | Freq: Every day | ORAL | Status: DC
  Administered 2014-07-13: 300 mg via ORAL
  Filled 2014-07-13 (×2): qty 1

## 2014-07-13 MED ORDER — METOCLOPRAMIDE HCL 10 MG PO TABS: 5.0000 mg | ORAL_TABLET | Freq: Three times a day (TID) | ORAL | Status: DC | PRN

## 2014-07-13 MED ORDER — PROPOFOL 10 MG/ML IV BOLUS
INTRAVENOUS | Status: DC | PRN
  Administered 2014-07-13: 50 mg via INTRAVENOUS
  Administered 2014-07-13: 30 mg via INTRAVENOUS

## 2014-07-13 MED ORDER — LIDOCAINE HCL 2 % IJ SOLN
INTRAMUSCULAR | Status: AC
  Filled 2014-07-13: qty 20

## 2014-07-13 MED ORDER — EPHEDRINE SULFATE 50 MG/ML IJ SOLN
INTRAMUSCULAR | Status: AC
  Filled 2014-07-13: qty 1

## 2014-07-13 MED ORDER — OXYCODONE-ACETAMINOPHEN 5-325 MG PO TABS: 1.0000 | ORAL_TABLET | ORAL | Status: DC | PRN

## 2014-07-13 MED ORDER — METOPROLOL SUCCINATE ER 50 MG PO TB24
50.0000 mg | ORAL_TABLET | Freq: Every day | ORAL | Status: DC
  Administered 2014-07-14: 50 mg via ORAL
  Filled 2014-07-13: qty 1

## 2014-07-13 MED ORDER — RAMIPRIL 2.5 MG PO CAPS
2.5000 mg | ORAL_CAPSULE | Freq: Every day | ORAL | Status: DC
  Administered 2014-07-14: 2.5 mg via ORAL
  Filled 2014-07-13 (×3): qty 1

## 2014-07-13 MED ORDER — METOCLOPRAMIDE HCL 5 MG/ML IJ SOLN: 5.0000 mg | Freq: Three times a day (TID) | INTRAMUSCULAR | Status: DC | PRN

## 2014-07-13 MED ORDER — IPRATROPIUM-ALBUTEROL 0.5-2.5 (3) MG/3ML IN SOLN
3.0000 mL | Freq: Four times a day (QID) | RESPIRATORY_TRACT | Status: DC
  Administered 2014-07-13 – 2014-07-14 (×2): 3 mL via RESPIRATORY_TRACT
  Filled 2014-07-13 (×2): qty 3

## 2014-07-13 MED ORDER — ONDANSETRON HCL 4 MG/2ML IJ SOLN: 4.0000 mg | Freq: Four times a day (QID) | INTRAMUSCULAR | Status: DC | PRN

## 2014-07-13 MED ORDER — WARFARIN SODIUM 7.5 MG PO TABS: 7.5000 mg | ORAL_TABLET | ORAL | Status: DC

## 2014-07-13 MED ORDER — ISOSORBIDE MONONITRATE ER 30 MG PO TB24
30.0000 mg | ORAL_TABLET | Freq: Every day | ORAL | Status: DC
  Administered 2014-07-14: 30 mg via ORAL
  Filled 2014-07-13: qty 1

## 2014-07-13 MED ORDER — HYDROMORPHONE HCL 1 MG/ML IJ SOLN
0.5000 mg | INTRAMUSCULAR | Status: DC | PRN
  Administered 2014-07-13 – 2014-07-14 (×6): 1 mg via INTRAVENOUS
  Filled 2014-07-13 (×6): qty 1

## 2014-07-13 MED ORDER — SODIUM CHLORIDE 0.9 % IJ SOLN
INTRAMUSCULAR | Status: AC
  Filled 2014-07-13: qty 10

## 2014-07-13 MED ORDER — DOCUSATE SODIUM 100 MG PO CAPS
100.0000 mg | ORAL_CAPSULE | Freq: Two times a day (BID) | ORAL | Status: DC
  Administered 2014-07-13 – 2014-07-14 (×3): 100 mg via ORAL
  Filled 2014-07-13 (×3): qty 1

## 2014-07-13 MED ORDER — FENOFIBRATE 145 MG PO TABS
145.0000 mg | ORAL_TABLET | Freq: Every day | ORAL | Status: DC
  Administered 2014-07-14: 145 mg via ORAL
  Filled 2014-07-13: qty 1

## 2014-07-13 MED ORDER — ALBUTEROL SULFATE ER 4 MG PO TB12
4.0000 mg | ORAL_TABLET | Freq: Two times a day (BID) | ORAL | Status: DC
  Filled 2014-07-13 (×4): qty 1

## 2014-07-13 MED ORDER — WARFARIN - PHYSICIAN DOSING INPATIENT: Freq: Every day | Status: DC

## 2014-07-13 MED ORDER — OXYCODONE-ACETAMINOPHEN 5-325 MG PO TABS
1.0000 | ORAL_TABLET | ORAL | Status: DC | PRN
  Administered 2014-07-13: 2 via ORAL
  Filled 2014-07-13: qty 2

## 2014-07-13 MED ORDER — ONDANSETRON HCL 4 MG/2ML IJ SOLN
INTRAMUSCULAR | Status: AC
  Filled 2014-07-13: qty 2

## 2014-07-13 MED ORDER — MIDAZOLAM HCL 2 MG/2ML IJ SOLN
INTRAMUSCULAR | Status: AC
  Filled 2014-07-13: qty 2

## 2014-07-13 MED ORDER — SUCCINYLCHOLINE CHLORIDE 20 MG/ML IJ SOLN
INTRAMUSCULAR | Status: AC
  Filled 2014-07-13: qty 1

## 2014-07-13 MED ORDER — INSULIN ASPART 100 UNIT/ML ~~LOC~~ SOLN
0.0000 [IU] | Freq: Three times a day (TID) | SUBCUTANEOUS | Status: DC
  Administered 2014-07-13: 1 [IU] via SUBCUTANEOUS
  Administered 2014-07-14: 2 [IU] via SUBCUTANEOUS

## 2014-07-13 MED ORDER — PROPOFOL 10 MG/ML IV BOLUS
INTRAVENOUS | Status: AC
  Filled 2014-07-13: qty 20

## 2014-07-13 MED ORDER — FUROSEMIDE 80 MG PO TABS
80.0000 mg | ORAL_TABLET | Freq: Two times a day (BID) | ORAL | Status: DC
  Administered 2014-07-14: 80 mg via ORAL
  Filled 2014-07-13 (×4): qty 1

## 2014-07-13 MED ORDER — 0.9 % SODIUM CHLORIDE (POUR BTL) OPTIME
TOPICAL | Status: DC | PRN
  Administered 2014-07-13: 1000 mL

## 2014-07-13 MED ORDER — NITROGLYCERIN 0.4 MG SL SUBL: 0.4000 mg | SUBLINGUAL_TABLET | SUBLINGUAL | Status: DC | PRN

## 2014-07-13 MED ORDER — LIDOCAINE HCL 2 % IJ SOLN
INTRAMUSCULAR | Status: DC | PRN
  Administered 2014-07-13: 10 mL

## 2014-07-13 MED ORDER — OXYCODONE-ACETAMINOPHEN 5-325 MG PO TABS
1.0000 | ORAL_TABLET | ORAL | Status: DC | PRN
  Administered 2014-07-13 – 2014-07-14 (×4): 2 via ORAL
  Filled 2014-07-13 (×4): qty 2

## 2014-07-13 MED ORDER — LIDOCAINE HCL (CARDIAC) 20 MG/ML IV SOLN
INTRAVENOUS | Status: AC
  Filled 2014-07-13: qty 5

## 2014-07-13 MED ORDER — ONDANSETRON HCL 4 MG PO TABS: 4.0000 mg | ORAL_TABLET | Freq: Four times a day (QID) | ORAL | Status: DC | PRN

## 2014-07-13 MED ORDER — METHOCARBAMOL 500 MG PO TABS
500.0000 mg | ORAL_TABLET | Freq: Four times a day (QID) | ORAL | Status: DC | PRN
  Administered 2014-07-13 – 2014-07-14 (×4): 500 mg via ORAL
  Filled 2014-07-13 (×4): qty 1

## 2014-07-13 SURGICAL SUPPLY — 32 items
BLADE SAW SGTL MED 73X18.5 STR (BLADE) IMPLANT
BNDG COHESIVE 4X5 TAN STRL (GAUZE/BANDAGES/DRESSINGS) ×3 IMPLANT
BNDG GAUZE ELAST 4 BULKY (GAUZE/BANDAGES/DRESSINGS) ×3 IMPLANT
COVER SURGICAL LIGHT HANDLE (MISCELLANEOUS) ×3 IMPLANT
DECANTER SPIKE VIAL GLASS SM (MISCELLANEOUS) ×2 IMPLANT
DRAPE U-SHAPE 47X51 STRL (DRAPES) ×6 IMPLANT
DRSG ADAPTIC 3X8 NADH LF (GAUZE/BANDAGES/DRESSINGS) ×3 IMPLANT
DRSG PAD ABDOMINAL 8X10 ST (GAUZE/BANDAGES/DRESSINGS) ×4 IMPLANT
DURAPREP 26ML APPLICATOR (WOUND CARE) ×3 IMPLANT
ELECT REM PT RETURN 9FT ADLT (ELECTROSURGICAL) ×3
ELECTRODE REM PT RTRN 9FT ADLT (ELECTROSURGICAL) ×1 IMPLANT
GAUZE SPONGE 4X4 12PLY STRL (GAUZE/BANDAGES/DRESSINGS) ×3 IMPLANT
GLOVE BIOGEL PI IND STRL 9 (GLOVE) ×1 IMPLANT
GLOVE BIOGEL PI INDICATOR 9 (GLOVE) ×2
GLOVE SURG ORTHO 9.0 STRL STRW (GLOVE) ×3 IMPLANT
GOWN STRL REUS W/ TWL XL LVL3 (GOWN DISPOSABLE) ×2 IMPLANT
GOWN STRL REUS W/TWL XL LVL3 (GOWN DISPOSABLE) ×6
KIT BASIN OR (CUSTOM PROCEDURE TRAY) ×3 IMPLANT
KIT ROOM TURNOVER OR (KITS) ×3 IMPLANT
NEEDLE 22X1 1/2 (OR ONLY) (NEEDLE) ×2 IMPLANT
NS IRRIG 1000ML POUR BTL (IV SOLUTION) ×3 IMPLANT
PACK ORTHO EXTREMITY (CUSTOM PROCEDURE TRAY) ×3 IMPLANT
PAD ARMBOARD 7.5X6 YLW CONV (MISCELLANEOUS) ×6 IMPLANT
SPONGE GAUZE 4X4 12PLY STER LF (GAUZE/BANDAGES/DRESSINGS) ×2 IMPLANT
SPONGE LAP 18X18 X RAY DECT (DISPOSABLE) ×3 IMPLANT
STOCKINETTE IMPERVIOUS LG (DRAPES) IMPLANT
SUT ETHILON 2 0 PSLX (SUTURE) ×6 IMPLANT
SYR CONTROL 10ML LL (SYRINGE) ×2 IMPLANT
TOWEL OR 17X24 6PK STRL BLUE (TOWEL DISPOSABLE) ×3 IMPLANT
TOWEL OR 17X26 10 PK STRL BLUE (TOWEL DISPOSABLE) ×3 IMPLANT
UNDERPAD 30X30 INCONTINENT (UNDERPADS AND DIAPERS) ×3 IMPLANT
WATER STERILE IRR 1000ML POUR (IV SOLUTION) ×3 IMPLANT

## 2014-07-13 NOTE — Accreditation Note (Signed)
CARE MANAGEMENT NOTE 07/13/2014  Patient:  Tanner Pena, Tanner Pena   Account Number:  [id number]  Date Initiated:  07/13/2014  Documentation initiated by:  Ricki Miller  Subjective/Objective Assessment:   73 yr old male admitted for left transmetatarsal amputation.     Action/Plan:   Patient will need DME for discharge.  No home health therapy needs identified.   Anticipated DC Date:  07/14/2014   Anticipated DC Plan:  [place]  CM consult      PAC Choice  DURABLE MEDICAL EQUIPMENT   Choice offered to / List presented to:     DME arranged  [place]      DME agency  [place] arranged  NA      Status of service:  Completed, signed off Medicare Important Message given?   (If response is "NO", the following Medicare IM given date fields will be blank) Date Medicare IM given:   Medicare IM given by:   Date Additional Medicare IM given:   Additional Medicare IM given by:    Discharge Disposition:  HOME/SELF CARE  Per UR Regulation:    If discussed at Long Length of Stay Meetings, dates discussed:    Comments:

## 2014-07-13 NOTE — Progress Notes (Signed)
Orthopedic Tech Progress Note Patient Details:  Tanner Pena 18-Aug-1941 [id number]  Ortho Devices Type of Ortho Device: Postop shoe/boot Ortho Device/Splint Location: lle Ortho Device/Splint Interventions: Application   [name] 07/13/2014, 12:12 PM

## 2014-07-13 NOTE — Plan of Care (Signed)
Problem: Phase I Progression Outcomes Goal: Pain controlled with appropriate interventions Outcome: Progressing Goal: OOB as tolerated unless otherwise ordered Outcome: Progressing Goal: Incision/dressings dry and intact Outcome: Completed/Met Date Met:  07/13/14 Goal: Sutures/staples intact Outcome: Completed/Met Date Met:  07/13/14 Goal: Tubes/drains patent Outcome: Not Applicable Date Met:  63/78/58 Goal: Initial discharge plan identified Outcome: Progressing Goal: Voiding-avoid urinary catheter unless indicated Outcome: Completed/Met Date Met:  07/13/14 Goal: Vital signs/hemodynamically stable Outcome: Completed/Met Date Met:  07/13/14

## 2014-07-13 NOTE — Anesthesia Postprocedure Evaluation (Signed)
Anesthesia Post Note  Patient: Tanner Pena  Procedure(s) Performed: Procedure(s) (LRB): Transmetatarsal Amputation (Left)  Anesthesia type: MAC  Patient location: PACU  Post pain: Pain level controlled  Post assessment: Patient's Cardiovascular Status Stable  Last Vitals:  Filed Vitals:   07/13/14 1100  BP: 144/48  Pulse: 29  Temp: 36.6 C  Resp: 16    Post vital signs: Reviewed and stable  Level of consciousness: sedated  Complications: No apparent anesthesia complications

## 2014-07-13 NOTE — Transfer of Care (Signed)
Immediate Anesthesia Transfer of Care Note  Patient: Tanner Pena  Procedure(s) Performed: Procedure(s): Transmetatarsal Amputation (Left)  Patient Location: SICU  Anesthesia Type:MAC  Level of Consciousness: awake, alert  and patient cooperative  Airway & Oxygen Therapy: Patient Spontanous Breathing and Patient connected to face mask oxygen  Post-op Assessment: Report given to PACU RN and Post -op Vital signs reviewed and stable  Post vital signs: Reviewed and stable  Complications: No apparent anesthesia complications

## 2014-07-13 NOTE — H&P (Signed)
Tanner Pena is an 73 y.o. male.   Chief Complaint: Osteomyelitis abscess ulceration left forefoot HPI: Patient is a 73 year old gentleman with diabetic insensate neuropathy peripheral vascular disease status post stenting to the lower extremity who presents with ischemic ulceration to the left forefoot. Due to failure conservative care patient presents at this time for transmetatarsal amputation.  Past Medical History  Diagnosis Date  . Diabetes mellitus 2007  . Hypertension   . Gout   . Hypercholesteremia   . Peripheral vascular disease     stents lower extremity  . Sleep apnea   . COPD (chronic obstructive pulmonary disease)   . S/P aortic valve replacement 1990    a. St. Jude  . Chronic back pain   . Dysphagia   . Neuromuscular disorder   . Peripheral neuropathy   . Critical lower limb ischemia   . CAD (coronary artery disease)     a. 05/13/14 [name] s/p overlapping DESx2 to SVG to RCA. b.  s/p CABG '90 with redo '94 & stent to RCA SVG in 2005  . Chronic toe ulcer     a. left foot  . Shortness of breath   . Myocardial infarction   . CHF (congestive heart failure)   . Stone in kidney   . GERD (gastroesophageal reflux disease)   . Arthritis     Past Surgical History  Procedure Laterality Date  . Open heart surgery  1990    prosthetic heart valve, one bypass  . Rotator cuff repair      right  . Cataract extraction      bilateral  . Coronary stent placement  2005    RCA vein graft A 3.0x13.0 TAXUS stent was then placed int he vessel a Viva 3.0x4.0 (perfusion balloon was made ready it was placed through the entire lenght of the stent  . Peripheral vascular procedures lower extremities      right external iliac  artery PTA and stenting as well as bilateral SFA intervention remotely. Repeat procedures in 2011 bilaterally  . Coronary artery bypass graft  1994    6 vessels  . [name]  06/13/2011    Procedure: Venia Minks DILATION;  Surgeon: [name], MD;   Location: [facility];  Service: Endoscopy;  Laterality: N/A;  Dilated to 56.   . Angioplasty illiac artery    . Back surgery  [id number]    2  . Eye surgery      Family History  Problem Relation Age of Onset  . Colon cancer Neg Hx   . Liver disease Neg Hx    Social History:  reports that he quit smoking about 2 months ago. His smoking use included Cigarettes. He started smoking about 60 years ago. He has a 55 pack-year smoking history. His smokeless tobacco use includes Chew. He reports that he drinks alcohol. He reports that he does not use illicit drugs.  Allergies: No Known Allergies  No prescriptions prior to admission    No results found for this or any previous visit (from the past 48 hour(s)). No results found.  Review of Systems  All other systems reviewed and are negative.   There were no vitals taken for this visit. Physical Exam  Ischemic gangrenous changes to the left forefoot secondary to peripheral vascular disease status post stent for the left lower extremity. Assessment/Plan Assessment: Gangrene left forefoot.  Plan: We'll plan for transmetatarsal amputation. Discussed the patient is at increased risk of the wound not healing potential for  higher level amputation. Patient states he understands and wishes to proceed at this time.  [name] V 07/13/2014, 6:53 AM

## 2014-07-13 NOTE — Progress Notes (Signed)
Call to Dr. Conrad Palermo, reported pt. Had cxr 1V 2 months ago,showing patchy opacity.  Spoke with Dr. Sharol Given, stating ankle block is OK for this pt. For anesth.  No need for repeat CXR.

## 2014-07-13 NOTE — Care Management Note (Deleted)
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     Action/Plan:   Case manager spoke with son. He states his mother has 24/7 care at home. She is active with [facility]. CM called Miranda, [place] liaison to have care resumed.   Anticipated DC Date:  07/13/2014   Anticipated DC Plan:  [place]  CM consult      [facility] Choice  Resumption Of Svcs/PTA Provider   Choice offered to / List presented to:  C-4 Adult Children   DME arranged  NA        HH arranged  HH-1 RN      [place].   Status of service:  Completed, signed off Medicare Important Message given?  NA - LOS <3 / Initial given by admissions (If response is "NO", the following Medicare IM given date fields will be blank) Date Medicare IM given:   Medicare IM given by:   Date Additional Medicare IM given:   Additional Medicare IM given by:    Discharge Disposition:  [place]  Per UR Regulation:  Reviewed for med. necessity/level of care/duration of stay  If discussed at [place] of Stay Meetings, dates discussed:    Comments:  07/13/14 [place], RN BSN Case Manager Patient will need to transport home via [place]. Case manager requested that Social worker arrange transport home.

## 2014-07-13 NOTE — Anesthesia Preprocedure Evaluation (Addendum)
Anesthesia Evaluation  Patient identified by MRN, date of birth, ID band Patient awake    Reviewed: Allergy & Precautions, H&P , NPO status , Patient's Chart, lab work & pertinent test results  History of Anesthesia Complications Negative for: history of anesthetic complications  Airway Mallampati: II  TM Distance: >3 FB Neck ROM: Full    Dental   Pulmonary shortness of breath and with exertion, sleep apnea , COPDformer smoker,  + rhonchi   + decreased breath sounds      Cardiovascular hypertension, Pt. on medications + CAD, + Past MI, + Peripheral Vascular Disease and +CHF + dysrhythmias Rhythm:Irregular Rate:Normal  Most recent ECHO 07/03/2014 Study Conclusions  - Left ventricle: The cavity size was at the upper limits of normal. Wall thickness was increased in a pattern of moderate LVH. Systolic function was mildly reduced. The estimated ejection fraction was in the range of 45% to 50%. Left ventricular diastolic function parameters were normal. - Aortic valve: A mechanical prosthesis was present. There was moderate stenosis. Valve area (Vmax): 4.01 cm^2. - Left atrium: The atrium was moderately dilated. - Right atrium: The atrium was moderately dilated.    Neuro/Psych    GI/Hepatic GERD-  Medicated and Controlled,  Endo/Other  diabetes, Type 2, Insulin Dependent  Renal/GU negative Renal ROS     Musculoskeletal   Abdominal Normal abdominal exam  (+)   Peds  Hematology   Anesthesia Other Findings   Reproductive/Obstetrics                          Anesthesia Physical Anesthesia Plan  ASA: III  Anesthesia Plan: MAC   Post-op Pain Management: MAC Combined w/ Regional for Post-op pain   Induction: Intravenous  Airway Management Planned: Natural Airway  Additional Equipment:   Intra-op Plan:   Post-operative Plan:   Informed Consent: I have reviewed the patients  History and Physical, chart, labs and discussed the procedure including the risks, benefits and alternatives for the proposed anesthesia with the patient or authorized representative who has indicated his/her understanding and acceptance.     Plan Discussed with: CRNA and Surgeon  Anesthesia Plan Comments:        Anesthesia Quick Evaluation

## 2014-07-13 NOTE — Op Note (Signed)
07/13/2014  10:09 AM  Tanner Pena:  Tanner Pena    PRE-OPERATIVE DIAGNOSIS:  Gangrene Left Foot  POST-OPERATIVE DIAGNOSIS:  Same  PROCEDURE:  Transmetatarsal Amputation left  SURGEON:  [name], MD  PHYSICIAN ASSISTANT:None ANESTHESIA:   General  PREOPERATIVE INDICATIONS:  Tanner Pena is a  73 y.o. male with a diagnosis of Gangrene Left Foot who failed conservative measures and elected for surgical management.    The risks benefits and alternatives were discussed with the Tanner Pena preoperatively including but not limited to the risks of infection, bleeding, nerve injury, cardiopulmonary complications, the need for revision surgery, among others, and the Tanner Pena was willing to proceed.  OPERATIVE IMPLANTS: none  OPERATIVE FINDINGS: Ischemic tissue  OPERATIVE PROCEDURE: Tanner Pena is a 73 year old gentleman with ischemic changes to the forefoot. Tanner Pena presents at this time for transmetatarsal amputation after failure of conservative care. Tanner Pena was brought to the operating room after undergoing an ankle block. After adequate levels of anesthesia were obtained Tanner Pena's left lower extremity was prepped using DuraPrep draped into a sterile field. A fishmouth incision was made through the midfoot. A transmetatarsal amputation was made through the mid transmetatarsal region with an oscillating saw. Electrocautery was used for hemostasis. There was good contractility in the muscles but very small amount petechial bleeding. The incision was closed using 2-0 nylon. Sterile compressive dressing was applied. Tanner Pena was taken to the PACU in stable condition.

## 2014-07-13 NOTE — Evaluation (Signed)
Physical Therapy Evaluation Patient Details Name: Tanner Pena MRN: [id number] DOB: 07/17/1941 Today's Date: 07/13/2014   History of Present Illness  73 yr old male admitted for left transmetatarsal amputation.   Clinical Impression  Patient is s/p above surgery resulting in functional limitations due to the deficits listed below (see PT Problem List). Patient will benefit from skilled PT to increase their independence and safety with mobility to allow discharge to the venue listed below. Pt hopeful to D/C home in morning session. Patient needs to practice stairs next session.  Pt with difficulty managing pain this session. All equipment needs made known to CSM.      Follow Up Recommendations No PT follow up;Supervision/Assistance - 24 hour;Other (comment) (OPPT when WB status is updated)    Equipment Recommendations  Rolling walker with 5" wheels;Wheelchair (measurements PT);Wheelchair cushion (measurements PT)    Recommendations for Other Services       Precautions / Restrictions Precautions Precautions: None Required Braces or Orthoses: Other Brace/Splint Other Brace/Splint: post op shoe Restrictions Weight Bearing Restrictions: Yes LLE Weight Bearing: Touchdown weight bearing      Mobility  Bed Mobility Overal bed mobility: Modified Independent             General bed mobility comments: bed flattened to simulate home; incr time required due to pain; use of handrails  Transfers Overall transfer level: Needs assistance Equipment used: Rolling walker (2 wheeled) Transfers: Sit to/from Stand Sit to Stand: Min guard         General transfer comment: cues for technique and to maintain TDWB status; min guard to steady with powering up to stand  Ambulation/Gait Ambulation/Gait assistance: Min guard Ambulation Distance (Feet): 12 Feet (4', 8') Assistive device: Rolling walker (2 wheeled) Gait Pattern/deviations: Step-to pattern Gait velocity: decr Gait  velocity interpretation: Below normal speed for age/gender General Gait Details: pt initially required immeidate return to sitting after 2 short steps and was returned to bed; pt limited by pain; cues for technique and to maintain TDWB status; red drainage noted on dressing and post op shoe after ambulation; min guard to steady; cues for technique with RW  Stairs            Wheelchair Mobility    Modified Rankin (Stroke Patients Only)       Balance Overall balance assessment: Needs assistance Sitting-balance support: Feet supported;No upper extremity supported Sitting balance-Leahy Scale: Fair Sitting balance - Comments: guarded due to pain   Standing balance support: Bilateral upper extremity supported;During functional activity Standing balance-Leahy Scale: Poor Standing balance comment: relying on RW due to TDWB status                              Pertinent Vitals/Pain Pain Assessment: 0-10 Pain Score: 9  Pain Location: Lt foot at surgery site Pain Descriptors / Indicators: Burning Pain Intervention(s): Monitored during session;Premedicated before session;Limited activity within patient's tolerance;Repositioned    Home Living Family/patient expects to be discharged to:: Private residence Living Arrangements: Other relatives;Other (Comment);Children Available Help at Discharge: Family;Available 24 hours/day Type of Home: House Home Access: Stairs to enter Entrance Stairs-Rails: None Entrance Stairs-Number of Steps: 2 Home Layout: One level Home Equipment: Crutches      Prior Function Level of Independence: Independent         Comments: 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; very active     Hand Dominance        Extremity/Trunk  Assessment   Upper Extremity Assessment: Overall WFL for tasks assessed           Lower Extremity Assessment: LLE deficits/detail   LLE Deficits / Details: c/o numbness and burning on Lt LE   Cervical /  Trunk Assessment: Normal  Communication   Communication: No difficulties  Cognition Arousal/Alertness: Awake/alert Behavior During Therapy: WFL for tasks assessed/performed Overall Cognitive Status: Within Functional Limits for tasks assessed                      General Comments General comments (skin integrity, edema, etc.): RN made aware of drainage noted    Exercises        Assessment/Plan    PT Assessment Patient needs continued PT services  PT Diagnosis Difficulty walking   PT Problem List Decreased strength;Decreased activity tolerance;Decreased balance;Decreased mobility;Decreased knowledge of use of DME;Decreased knowledge of precautions;Pain  PT Treatment Interventions DME instruction;Gait training;Stair training;Functional mobility training;Therapeutic activities;Therapeutic exercise;Balance training;Neuromuscular re-education;Patient/family education   PT Goals (Current goals can be found in the Care Plan section) Acute Rehab PT Goals Patient Stated Goal: to go home first thing in the morning PT Goal Formulation: With patient Time For Goal Achievement: 07/16/14 Potential to Achieve Goals: Good    Frequency Min 3X/week   Barriers to discharge        Co-evaluation               End of Session Equipment Utilized During Treatment: Gait belt;Other (comment) (post op shoe) Activity Tolerance: Patient limited by pain Patient left: in chair;with call bell/phone within reach;with family/visitor present;with nursing/sitter in room Nurse Communication: Mobility status    Functional Assessment Tool Used: clinical judgement Functional Limitation: Mobility: Walking and moving around Mobility: Walking and Moving Around Current Status ([id number]): At least 1 percent but less than 20 percent impaired, limited or restricted Mobility: Walking and Moving Around Goal Status [phone]): At least 1 percent but less than 20 percent impaired, limited or restricted Mobility:  Walking and Moving Around Discharge Status [phone]): At least 1 percent but less than 20 percent impaired, limited or restricted    Time: 1535-1557 PT Time Calculation (min) (ACUTE ONLY): 22 min   Charges:   PT Evaluation $Initial PT Evaluation Tier I: 1 Procedure PT Treatments $Gait Training: 8-22 mins   PT G Codes:   Functional Assessment Tool Used: clinical judgement Functional Limitation: Mobility: Walking and moving around    [place], [place], [place]  [phone] 07/13/2014, 4:40 PM

## 2014-07-13 NOTE — Anesthesia Procedure Notes (Signed)
Procedure Name: MAC Date/Time: 07/13/2014 9:35 AM Performed by: Izora Gala Pre-anesthesia Checklist: Patient identified Patient Re-evaluated:Patient Re-evaluated prior to inductionPreoxygenation: Pre-oxygenation with 100% oxygen Intubation Type: IV induction Ventilation: Oral airway inserted - appropriate to patient size Placement Confirmation: positive ETCO2

## 2014-07-13 NOTE — Addendum Note (Signed)
Addendum  created 07/13/14 1404 by Duane Boston, MD   Modules edited: Anesthesia Responsible Staff

## 2014-07-14 DIAGNOSIS — I96 Gangrene, not elsewhere classified: Secondary | ICD-10-CM | POA: Diagnosis not present

## 2014-07-14 LAB — GLUCOSE, CAPILLARY: GLUCOSE-CAPILLARY: 140 mg/dL — AB (ref 70–99)

## 2014-07-14 NOTE — Progress Notes (Signed)
Patient ID: Tanner Pena, male   DOB: 1941/03/20, 73 y.o.   MRN: [id number] Subjective: 1 Day Post-Op Procedure(s) (LRB): Transmetatarsal Amputation (Left) Awake,alert and oriented x 4. Okay to go home. Tolerating po pain meds. Elevating left leg. Patient reports pain as moderate.    Objective:   VITALS:  Temp:  [97.8 F (36.6 C)-99.5 F (37.5 C)] 98.1 F (36.7 C) (11/26 0621) Pulse Rate:  [29-81] 70 (11/26 0621) Resp:  [16] 16 (11/26 0621) BP: (139-176)/(48-65) 176/63 mmHg (11/26 0621) SpO2:  [93 %-100 %] 98 % (11/26 1008)  ABD soft Dorsiflexion/Plantar flexion intact Incision: scant drainage   LABS  Recent Labs  07/13/14 0741  HGB 11.9*  WBC 10.2  PLT 278    Recent Labs  07/13/14 0741  NA 139  K 4.2  CL 103  CO2 24  BUN 34*  CREATININE 1.47*  GLUCOSE 149*    Recent Labs  07/13/14 0742  INR 3.14*     Assessment/Plan: 1 Day Post-Op Procedure(s) (LRB): Transmetatarsal Amputation (Left)  Advance diet Discharge home with home health D/C IVF. Non weight bearing left leg.  Aunya Lemler E 07/14/2014, 10:51 AM

## 2014-07-14 NOTE — Discharge Summary (Signed)
Physician Discharge Summary  Patient ID: Tanner Pena MRN: [id number] DOB/AGE: [birth year] 73 y.o.  Admit date: 07/13/2014 Discharge date:   Admission Diagnoses:  Active Problems:   S/P transmetatarsal amputation of foot   Discharge Diagnoses:  Same  Past Medical History  Diagnosis Date  . Diabetes mellitus 2007  . Hypertension   . Gout   . Hypercholesteremia   . Peripheral vascular disease     stents lower extremity  . Sleep apnea   . COPD (chronic obstructive pulmonary disease)   . S/P aortic valve replacement 1990    a. [employer]  . Chronic back pain   . Dysphagia   . Neuromuscular disorder   . Peripheral neuropathy   . Critical lower limb ischemia   . CAD (coronary artery disease)     a. 05/13/14 [name] s/p overlapping DESx2 to SVG to RCA. b.  s/p CABG '90 with redo '94 & stent to RCA SVG in 2005  . Chronic toe ulcer     a. left foot  . Shortness of breath   . Myocardial infarction   . CHF (congestive heart failure)   . Stone in kidney   . GERD (gastroesophageal reflux disease)   . Arthritis     Surgeries: Procedure(s): Transmetatarsal Amputation on 07/13/2014   Consultants:    Discharged Condition: Improved  Hospital Course: Tanner Pena is an 73 y.o. male who was admitted 07/13/2014 with a chief complaint of No chief complaint on file. , and found to have a diagnosis of <principal problem not specified>.  They were brought to the operating room on 07/13/2014 and underwent the above named procedures.    They were given perioperative antibiotics:  Anti-infectives    Start     Dose/Rate Route Frequency Ordered Stop   07/13/14 1700  ceFAZolin (ANCEF) IVPB 1 g/50 mL premix     1 g100 mL/hr over 30 Minutes Intravenous Every 6 hours 07/13/14 1120 07/14/14 1359   07/13/14 0600  ceFAZolin (ANCEF) IVPB 2 g/50 mL premix     2 g100 mL/hr over 30 Minutes Intravenous On call to O.R. 07/12/14 1334 07/13/14 0939    Patient takes coumadin chronicly, and will  remain on this for anticoagulation and anti DVT prophylaxis. POD#1 awake alert and oriented x 4. Dressing with minimal spot blood plantar lateral 1 cm2, reinforced. Wants to  Go home. Remain non weight bearing on the left leg post transmetatarsal amputation. Discharged home on post  Op Day #1 tolerating po narcotics and nourishment.  They were given sequential compression devices, early ambulation, and chemoprophylaxis for DVT prophylaxis.  They benefited maximally from their hospital stay and there were no complications.    Recent vital signs:  Filed Vitals:   07/14/14 0621  BP: 176/63  Pulse: 70  Temp: 98.1 F (36.7 C)  Resp: 16    Recent laboratory studies:  Results for orders placed or performed during the hospital encounter of 07/13/14  APTT  Result Value Ref Range   aPTT 46 (H) 24 - 37 seconds  CBC  Result Value Ref Range   WBC 10.2 4.0 - 10.5 K/uL   RBC 4.23 4.22 - 5.81 MIL/uL   Hemoglobin 11.9 (L) 13.0 - 17.0 g/dL   HCT 37.3 (L) 39.0 - 52.0 %   MCV 88.2 78.0 - 100.0 fL   MCH 28.1 26.0 - 34.0 pg   MCHC 31.9 30.0 - 36.0 g/dL   RDW 14.4 11.5 - 15.5 %   Platelets 278 150 -  400 K/uL  Comprehensive metabolic panel  Result Value Ref Range   Sodium 139 137 - 147 mEq/L   Potassium 4.2 3.7 - 5.3 mEq/L   Chloride 103 96 - 112 mEq/L   CO2 24 19 - 32 mEq/L   Glucose, Bld 149 (H) 70 - 99 mg/dL   BUN 34 (H) 6 - 23 mg/dL   Creatinine, Ser 1.47 (H) 0.50 - 1.35 mg/dL   Calcium 9.8 8.4 - 10.5 mg/dL   Total Protein 7.0 6.0 - 8.3 g/dL   Albumin 3.6 3.5 - 5.2 g/dL   AST 15 0 - 37 U/L   ALT 8 0 - 53 U/L   Alkaline Phosphatase 25 (L) 39 - 117 U/L   Total Bilirubin 0.3 0.3 - 1.2 mg/dL   GFR calc non Af Amer 46 (L) >90 mL/min   GFR calc Af Amer 53 (L) >90 mL/min   Anion gap 12 5 - 15  Protime-INR  Result Value Ref Range   Prothrombin Time 32.6 (H) 11.6 - 15.2 seconds   INR 3.14 (H) 0.00 - 1.49  Glucose, capillary  Result Value Ref Range   Glucose-Capillary 130 (H) 70 - 99  mg/dL  Glucose, capillary  Result Value Ref Range   Glucose-Capillary 128 (H) 70 - 99 mg/dL   Comment 1 Notify RN   Glucose, capillary  Result Value Ref Range   Glucose-Capillary 140 (H) 70 - 99 mg/dL  Glucose, capillary  Result Value Ref Range   Glucose-Capillary 122 (H) 70 - 99 mg/dL  Glucose, capillary  Result Value Ref Range   Glucose-Capillary 140 (H) 70 - 99 mg/dL   Comment 1 Notify RN     Discharge Medications:     Medication List    TAKE these medications        acetaminophen 500 MG tablet  Commonly known as:  TYLENOL  Take 500 mg by mouth every 6 (six) hours as needed for moderate pain.     albuterol 4 MG 12 hr tablet  Commonly known as:  VOSPIRE ER  Take 4 mg by mouth 2 (two) times daily.     albuterol-ipratropium 18-103 MCG/ACT inhaler  Commonly known as:  COMBIVENT  Inhale 1 puff into the lungs 4 (four) times daily. Coughing/ Shortness of Breath     allopurinol 300 MG tablet  Commonly known as:  ZYLOPRIM  Take 300 mg by mouth at bedtime.     cephALEXin 500 MG capsule  Commonly known as:  KEFLEX  Take 1 capsule (500 mg total) by mouth 2 (two) times daily.     clopidogrel 75 MG tablet  Commonly known as:  PLAVIX  Take 1 tablet (75 mg total) by mouth daily with breakfast.     fenofibrate 145 MG tablet  Commonly known as:  TRICOR  Take 1 tablet (145 mg total) by mouth daily.     fish oil-omega-3 fatty acids 1000 MG capsule  Take 1 capsule (1 g total) by mouth 2 (two) times daily.     furosemide 80 MG tablet  Commonly known as:  LASIX  Take 40 mg by mouth daily.     glimepiride 1 MG tablet  Commonly known as:  AMARYL  Take 1 mg by mouth 2 (two) times daily.     IODINE EX  Apply 1 application topically 2 (two) times daily.     isosorbide mononitrate 30 MG 24 hr tablet  Commonly known as:  IMDUR  Take 1 tablet (30 mg total) by mouth  daily.     metoprolol succinate 50 MG 24 hr tablet  Commonly known as:  TOPROL-XL  Take 1 tablet (50 mg total)  by mouth daily. Take with or immediately following a meal.     nicotine 14 mg/24hr patch  Commonly known as:  NICODERM CQ - dosed in mg/24 hours  Place 1 patch (14 mg total) onto the skin daily.     nitroGLYCERIN 0.4 MG SL tablet  Commonly known as:  NITROSTAT  Place 1 tablet (0.4 mg total) under the tongue every 5 (five) minutes as needed for chest pain.     Oxycodone HCl 20 MG Tabs  Take 20 mg by mouth every 6 (six) hours as needed (for pain).     oxyCODONE-acetaminophen 10-325 MG per tablet  Commonly known as:  PERCOCET  Take 1 tablet by mouth every 3 (three) hours as needed for pain.     oxyCODONE-acetaminophen 5-325 MG per tablet  Commonly known as:  ROXICET  Take 1 tablet by mouth every 4 (four) hours as needed for severe pain.     oxyCODONE-acetaminophen 5-325 MG per tablet  Commonly known as:  ROXICET  Take 1 tablet by mouth every 4 (four) hours as needed for severe pain.     potassium chloride SA 20 MEQ tablet  Commonly known as:  K-DUR,KLOR-CON  Take 20 mEq by mouth daily.     pregabalin 50 MG capsule  Commonly known as:  LYRICA  Take 50 mg by mouth 2 (two) times daily.     ramipril 5 MG capsule  Commonly known as:  ALTACE  Take 5 mg by mouth in the morning.     ramipril 2.5 MG capsule  Commonly known as:  ALTACE  Take 2.5 mg by mouth at bedtime.     rosuvastatin 20 MG tablet  Commonly known as:  CRESTOR  Take 20 mg by mouth at bedtime.     sitaGLIPtin 50 MG tablet  Commonly known as:  JANUVIA  Take 50 mg by mouth daily.     warfarin 5 MG tablet  Commonly known as:  COUMADIN  Take 1-1.5 tablets (5-7.5 mg total) by mouth at bedtime. Takes one and one-half tablet (7.5mg  total) on Mondays and Fridays. Takes one tablet (5mg  total) on all other days        Diagnostic Studies: No results found.  Disposition: 01-Home or Self Care      Discharge Instructions    Call MD / Call 911    Complete by:  As directed   If you experience chest pain or shortness  of breath, CALL 911 and be transported to the hospital emergency room.  If you develope a fever above 101 F, pus (white drainage) or increased drainage or redness at the wound, or calf pain, call your surgeon's office.     Call MD / Call 911    Complete by:  As directed   If you experience chest pain or shortness of breath, CALL 911 and be transported to the hospital emergency room.  If you develope a fever above 101 F, pus (white drainage) or increased drainage or redness at the wound, or calf pain, call your surgeon's office.     Call MD / Call 911    Complete by:  As directed   If you experience chest pain or shortness of breath, CALL 911 and be transported to the hospital emergency room.  If you develope a fever above 101 F, pus (white drainage) or increased  drainage or redness at the wound, or calf pain, call your surgeon's office.     Constipation Prevention    Complete by:  As directed   Drink plenty of fluids.  Prune juice may be helpful.  You may use a stool softener, such as Colace (over the counter) 100 mg twice a day.  Use MiraLax (over the counter) for constipation as needed.     Constipation Prevention    Complete by:  As directed   Drink plenty of fluids.  Prune juice may be helpful.  You may use a stool softener, such as Colace (over the counter) 100 mg twice a day.  Use MiraLax (over the counter) for constipation as needed.     Constipation Prevention    Complete by:  As directed   Drink plenty of fluids.  Prune juice may be helpful.  You may use a stool softener, such as Colace (over the counter) 100 mg twice a day.  Use MiraLax (over the counter) for constipation as needed.     Diet - low sodium heart healthy    Complete by:  As directed      Diet - low sodium heart healthy    Complete by:  As directed      Diet - low sodium heart healthy    Complete by:  As directed      Discharge instructions    Complete by:  As directed   Keep short leg splint and dressing dry. May use  water impervious bag or cast bag and tub chair to shower Tape the top of bag to skin to avoid moisture soaking the dressing on the leg. Call if there is odor or saturation of dressing or worsening pain not controlled with medications. Call if fever greater than 101.5. Use crutches or walker no weight bearing on the ankle fracture leg. Please follow up with an appointment with Dr. Sharol Given  2 weeks from the time of surgery. Elevate as often as possible during the first week after surgery gradually increasing the time the leg is dependent or down there after. If swelling recurrs then elevate again. Wheel chair for longer distances. Take anticoagulant daily.     Driving restrictions    Complete by:  As directed   No driving     Increase activity slowly as tolerated    Complete by:  As directed      Increase activity slowly as tolerated    Complete by:  As directed      Increase activity slowly as tolerated    Complete by:  As directed               Signed: Karlye Ihrig E 07/14/2014, 10:56 AM

## 2014-07-14 NOTE — Progress Notes (Signed)
Physical Therapy Treatment Patient Details Name: Tanner Pena MRN: [id number] DOB: 25-Jan-1941 Today's Date: 07/14/2014    History of Present Illness 73 yr old male admitted for left transmetatarsal amputation.     PT Comments    Session focused on mobility and addressing stair management technique. Pt given handout and multimodal instruction on safe stair management technique. Pt did not want to practice stair mobility himself due to incr pain but was able to verbalize back technique. Pt hopeful to D/C home today. All equipment in room. Reports he will have 24/7 (A) for 4-5 days upon return home. Will follow per POC while in hospital.   Follow Up Recommendations  No PT follow up;Supervision/Assistance - 24 hour;Other (comment)     Equipment Recommendations  Rolling walker with 5" wheels;Wheelchair (measurements PT);Wheelchair cushion (measurements PT)    Recommendations for Other Services       Precautions / Restrictions Precautions Precautions: None Required Braces or Orthoses: Other Brace/Splint Other Brace/Splint: post op shoe Restrictions Weight Bearing Restrictions: Yes LLE Weight Bearing: Touchdown weight bearing    Mobility  Bed Mobility Overal bed mobility: Modified Independent             General bed mobility comments: incr time due to pain; cues to use mattress for UE support  Transfers Overall transfer level: Needs assistance Equipment used: Rolling walker (2 wheeled) Transfers: Sit to/from Stand Sit to Stand: Supervision         General transfer comment: supervision for safety and min cues for technique; pt demo good understanding and abilty to maintain TDWB status   Ambulation/Gait Ambulation/Gait assistance: Supervision Ambulation Distance (Feet): 6 Feet Assistive device: Rolling walker (2 wheeled) Gait Pattern/deviations: Step-to pattern Gait velocity: decreased due to pain  Gait velocity interpretation: Below normal speed for 73/gender General Gait Details: limited due to pain; pt demo good ability to maintain TDWB status    Stairs Stairs: Yes       General stair comments: pt did not want to practice technique due to pain; pt given mulitmodal cues including visual verbal and handout for stair management technique; pt able to verbally teachback technique   Wheelchair Mobility    Modified Rankin (Stroke Patients Only)       Balance           Standing balance support: During functional activity;Bilateral upper extremity supported Standing balance-Leahy Scale: Poor Standing balance comment: RW to balance due to TDWB status                    Cognition Arousal/Alertness: Awake/alert Behavior During Therapy: WFL for tasks assessed/performed Overall Cognitive Status: Within Functional Limits for tasks assessed                      Exercises      General Comments        Pertinent Vitals/Pain Pain Assessment: 0-10 Pain Score: 9  Pain Location: Lt foot at surgical area  Pain Descriptors / Indicators: Burning;Throbbing Pain Intervention(s): Limited activity within patient's tolerance;Monitored during session;Premedicated before session;Repositioned    Home Living                      Prior Function            PT Goals (current goals can now be found in the care plan section) Acute Rehab PT Goals Patient Stated Goal: to go home today and get in my tree stand PT Goal Formulation: With  patient Time For Goal Achievement: 07/16/14 Potential to Achieve Goals: Good Progress towards PT goals: Progressing toward goals    Frequency  Min 3X/week    PT Plan Current plan remains appropriate    Co-evaluation             End of Session Equipment Utilized During Treatment: Gait belt Activity Tolerance: Patient limited by pain Patient left: in chair;with call bell/phone within reach     Time: 0725-0749 PT Time Calculation (min) (ACUTE ONLY): 24  min  Charges:  $Gait Training: 23-37 mins                    G Codes:  Functional Assessment Tool Used: clinical judgement Functional Limitation: Mobility: Walking and moving around Mobility: Walking and Moving Around Current Status [phone]): At least 1 percent but less than 20 percent impaired, limited or restricted Mobility: Walking and Moving Around Goal Status [phone]): At least 1 percent but less than 20 percent impaired, limited or restricted Mobility: Walking and Moving Around Discharge Status [phone]): At least 1 percent but less than 20 percent impaired, limited or restricted   Gustavus Bryant, [place] 07/14/2014, 7:54 AM

## 2014-07-14 NOTE — Plan of Care (Signed)
Problem: Consults Goal: Skin Care Protocol Initiated - if Braden Score 18 or less If consults are not indicated, leave blank or document N/A Outcome: Completed/Met Date Met:  07/14/14 Goal: Nutrition Consult-if indicated Outcome: Not Applicable Date Met:  67/20/94 Goal: Diabetes Guidelines if Diabetic/Glucose > 140 If diabetic or lab glucose is > 140 mg/dl - Initiate Diabetes/Hyperglycemia Guidelines & Document Interventions  Outcome: Completed/Met Date Met:  07/14/14  Problem: Phase I Progression Outcomes Goal: Pain controlled with appropriate interventions Outcome: Progressing Goal: Voiding-avoid urinary catheter unless indicated Outcome: Completed/Met Date Met:  07/14/14 Goal: Vital signs/hemodynamically stable Outcome: Progressing

## 2014-07-18 ENCOUNTER — Encounter (HOSPITAL_COMMUNITY): Payer: Self-pay | Admitting: Orthopedic Surgery

## 2014-07-28 ENCOUNTER — Encounter (HOSPITAL_COMMUNITY): Payer: Self-pay | Admitting: Cardiovascular Disease

## 2014-08-01 ENCOUNTER — Other Ambulatory Visit: Payer: Self-pay | Admitting: Pharmacist Clinician (PhC)/ Clinical Pharmacy Specialist

## 2014-08-01 LAB — PROTIME-INR: INR: 2.4 — AB (ref <=1.1)

## 2014-08-02 ENCOUNTER — Ambulatory Visit (INDEPENDENT_AMBULATORY_CARE_PROVIDER_SITE_OTHER): Payer: PRIVATE HEALTH INSURANCE | Admitting: Pharmacist Clinician (PhC)/ Clinical Pharmacy Specialist

## 2014-08-02 DIAGNOSIS — Z952 Presence of prosthetic heart valve: Secondary | ICD-10-CM

## 2014-08-02 DIAGNOSIS — Z954 Presence of other heart-valve replacement: Secondary | ICD-10-CM

## 2014-08-02 DIAGNOSIS — Z7901 Long term (current) use of anticoagulants: Secondary | ICD-10-CM

## 2014-08-02 LAB — PROTIME-INR
INR: 2.4 — ABNORMAL HIGH (ref <1.50)
Prothrombin Time: 26.2 seconds — ABNORMAL HIGH (ref 11.6–15.2)

## 2014-08-05 ENCOUNTER — Encounter: Payer: Self-pay | Admitting: Pharmacist Clinician (PhC)/ Clinical Pharmacy Specialist

## 2014-08-05 DIAGNOSIS — Z952 Presence of prosthetic heart valve: Secondary | ICD-10-CM

## 2014-08-05 DIAGNOSIS — Z7901 Long term (current) use of anticoagulants: Secondary | ICD-10-CM

## 2014-08-05 NOTE — Progress Notes (Signed)
This encounter was created in error - please disregard.

## 2014-08-18 ENCOUNTER — Encounter (HOSPITAL_COMMUNITY): Payer: Self-pay | Admitting: *Deleted

## 2014-08-18 ENCOUNTER — Other Ambulatory Visit (HOSPITAL_COMMUNITY): Payer: Self-pay | Admitting: Orthopedic Surgery

## 2014-08-18 NOTE — Progress Notes (Signed)
I spoke with Dr Jess Barters scheduler and requested orders and to ask about Coumadin and Plavix, "instructed to continue."

## 2014-08-20 ENCOUNTER — Encounter (HOSPITAL_COMMUNITY)
Admission: RE | Disposition: A | Discharge status: Skilled Nursing Facility | Payer: Self-pay | Source: Ambulatory Visit | Attending: Orthopedic Surgery

## 2014-08-20 ENCOUNTER — Inpatient Hospital Stay (HOSPITAL_COMMUNITY)
Admission: RE | Admit: 2014-08-20 | Discharge: 2014-08-22 | DRG: 475 | Disposition: A | Discharge status: Skilled Nursing Facility | Payer: Medicare Other | Source: Ambulatory Visit | Attending: Orthopedic Surgery | Admitting: Orthopedic Surgery

## 2014-08-20 ENCOUNTER — Inpatient Hospital Stay (HOSPITAL_COMMUNITY): Payer: Medicare Other | Admitting: Anesthesiology

## 2014-08-20 ENCOUNTER — Encounter (HOSPITAL_COMMUNITY): Payer: Self-pay | Admitting: *Deleted

## 2014-08-20 DIAGNOSIS — I2510 Atherosclerotic heart disease of native coronary artery without angina pectoris: Secondary | ICD-10-CM | POA: Diagnosis present

## 2014-08-20 DIAGNOSIS — Z7902 Long term (current) use of antithrombotics/antiplatelets: Secondary | ICD-10-CM | POA: Diagnosis not present

## 2014-08-20 DIAGNOSIS — J449 Chronic obstructive pulmonary disease, unspecified: Secondary | ICD-10-CM | POA: Diagnosis not present

## 2014-08-20 DIAGNOSIS — Z951 Presence of aortocoronary bypass graft: Secondary | ICD-10-CM

## 2014-08-20 DIAGNOSIS — Z7901 Long term (current) use of anticoagulants: Secondary | ICD-10-CM | POA: Diagnosis not present

## 2014-08-20 DIAGNOSIS — Z87891 Personal history of nicotine dependence: Secondary | ICD-10-CM | POA: Diagnosis not present

## 2014-08-20 DIAGNOSIS — R262 Difficulty in walking, not elsewhere classified: Secondary | ICD-10-CM | POA: Diagnosis not present

## 2014-08-20 DIAGNOSIS — G8929 Other chronic pain: Secondary | ICD-10-CM | POA: Diagnosis present

## 2014-08-20 DIAGNOSIS — I96 Gangrene, not elsewhere classified: Secondary | ICD-10-CM | POA: Diagnosis not present

## 2014-08-20 DIAGNOSIS — M1990 Unspecified osteoarthritis, unspecified site: Secondary | ICD-10-CM | POA: Diagnosis present

## 2014-08-20 DIAGNOSIS — T8781 Dehiscence of amputation stump: Principal | ICD-10-CM | POA: Diagnosis present

## 2014-08-20 DIAGNOSIS — E119 Type 2 diabetes mellitus without complications: Secondary | ICD-10-CM | POA: Diagnosis not present

## 2014-08-20 DIAGNOSIS — K219 Gastro-esophageal reflux disease without esophagitis: Secondary | ICD-10-CM | POA: Diagnosis present

## 2014-08-20 DIAGNOSIS — E780 Pure hypercholesterolemia: Secondary | ICD-10-CM | POA: Diagnosis not present

## 2014-08-20 DIAGNOSIS — Y835 Amputation of limb(s) as the cause of abnormal reaction of the patient, or of later complication, without mention of misadventure at the time of the procedure: Secondary | ICD-10-CM | POA: Diagnosis present

## 2014-08-20 DIAGNOSIS — Z952 Presence of prosthetic heart valve: Secondary | ICD-10-CM | POA: Diagnosis not present

## 2014-08-20 DIAGNOSIS — M25562 Pain in left knee: Secondary | ICD-10-CM | POA: Diagnosis not present

## 2014-08-20 DIAGNOSIS — I509 Heart failure, unspecified: Secondary | ICD-10-CM | POA: Diagnosis not present

## 2014-08-20 DIAGNOSIS — M109 Gout, unspecified: Secondary | ICD-10-CM | POA: Diagnosis not present

## 2014-08-20 DIAGNOSIS — E1142 Type 2 diabetes mellitus with diabetic polyneuropathy: Secondary | ICD-10-CM | POA: Diagnosis not present

## 2014-08-20 DIAGNOSIS — I252 Old myocardial infarction: Secondary | ICD-10-CM | POA: Diagnosis not present

## 2014-08-20 DIAGNOSIS — I739 Peripheral vascular disease, unspecified: Secondary | ICD-10-CM | POA: Diagnosis not present

## 2014-08-20 DIAGNOSIS — M1000 Idiopathic gout, unspecified site: Secondary | ICD-10-CM | POA: Diagnosis not present

## 2014-08-20 DIAGNOSIS — G4730 Sleep apnea, unspecified: Secondary | ICD-10-CM | POA: Diagnosis present

## 2014-08-20 DIAGNOSIS — I10 Essential (primary) hypertension: Secondary | ICD-10-CM | POA: Diagnosis present

## 2014-08-20 DIAGNOSIS — Z955 Presence of coronary angioplasty implant and graft: Secondary | ICD-10-CM | POA: Diagnosis not present

## 2014-08-20 DIAGNOSIS — M868X6 Other osteomyelitis, lower leg: Secondary | ICD-10-CM | POA: Diagnosis not present

## 2014-08-20 DIAGNOSIS — S88012A Complete traumatic amputation at knee level, left lower leg, initial encounter: Secondary | ICD-10-CM | POA: Diagnosis not present

## 2014-08-20 DIAGNOSIS — IMO0002 Reserved for concepts with insufficient information to code with codable children: Secondary | ICD-10-CM

## 2014-08-20 DIAGNOSIS — L97929 Non-pressure chronic ulcer of unspecified part of left lower leg with unspecified severity: Secondary | ICD-10-CM | POA: Diagnosis present

## 2014-08-20 DIAGNOSIS — R278 Other lack of coordination: Secondary | ICD-10-CM | POA: Diagnosis not present

## 2014-08-20 DIAGNOSIS — I70262 Atherosclerosis of native arteries of extremities with gangrene, left leg: Secondary | ICD-10-CM | POA: Diagnosis not present

## 2014-08-20 DIAGNOSIS — M6281 Muscle weakness (generalized): Secondary | ICD-10-CM | POA: Diagnosis not present

## 2014-08-20 DIAGNOSIS — I70242 Atherosclerosis of native arteries of left leg with ulceration of calf: Secondary | ICD-10-CM | POA: Diagnosis not present

## 2014-08-20 HISTORY — PX: AMPUTATION: SHX166

## 2014-08-20 LAB — GLUCOSE, CAPILLARY
GLUCOSE-CAPILLARY: 112 mg/dL — AB (ref 70–99)
GLUCOSE-CAPILLARY: 138 mg/dL — AB (ref 70–99)
GLUCOSE-CAPILLARY: 163 mg/dL — AB (ref 70–99)
Glucose-Capillary: 118 mg/dL — ABNORMAL HIGH (ref 70–99)
Glucose-Capillary: 159 mg/dL — ABNORMAL HIGH (ref 70–99)

## 2014-08-20 LAB — CBC
HEMATOCRIT: 37.2 % — AB (ref 39.0–52.0)
Hemoglobin: 12.1 g/dL — ABNORMAL LOW (ref 13.0–17.0)
MCH: 28.5 pg (ref 26.0–34.0)
MCHC: 32.5 g/dL (ref 30.0–36.0)
MCV: 87.5 fL (ref 78.0–100.0)
PLATELETS: 157 10*3/uL (ref 150–400)
RBC: 4.25 MIL/uL (ref 4.22–5.81)
RDW: 16.2 % — AB (ref 11.5–15.5)
WBC: 7.4 10*3/uL (ref 4.0–10.5)

## 2014-08-20 LAB — APTT: aPTT: 44 seconds — ABNORMAL HIGH (ref 24–37)

## 2014-08-20 LAB — BASIC METABOLIC PANEL
Anion gap: 10 (ref 5–15)
BUN: 22 mg/dL (ref 6–23)
CALCIUM: 9.6 mg/dL (ref 8.4–10.5)
CHLORIDE: 107 meq/L (ref 96–112)
CO2: 23 mmol/L (ref 19–32)
CREATININE: 0.99 mg/dL (ref 0.50–1.35)
GFR, EST NON AFRICAN AMERICAN: 79 mL/min — AB (ref >90)
GLUCOSE: 124 mg/dL — AB (ref 70–99)
POTASSIUM: 3.7 mmol/L (ref 3.5–5.1)
SODIUM: 140 mmol/L (ref 135–145)

## 2014-08-20 LAB — PROTIME-INR
INR: 3.13 — ABNORMAL HIGH (ref 0.00–1.49)
Prothrombin Time: 32.4 seconds — ABNORMAL HIGH (ref 11.6–15.2)

## 2014-08-20 SURGERY — AMPUTATION BELOW KNEE
Anesthesia: General | Site: Leg Lower | Laterality: Left

## 2014-08-20 MED ORDER — PROPOFOL 10 MG/ML IV BOLUS
INTRAVENOUS | Status: AC
  Filled 2014-08-20: qty 20

## 2014-08-20 MED ORDER — DEXTROSE 5 % IV SOLN
500.0000 mg | Freq: Four times a day (QID) | INTRAVENOUS | Status: DC | PRN
  Administered 2014-08-21: 500 mg via INTRAVENOUS
  Filled 2014-08-20 (×2): qty 5

## 2014-08-20 MED ORDER — HYDROMORPHONE HCL 1 MG/ML IJ SOLN
0.5000 mg | INTRAMUSCULAR | Status: DC | PRN
  Administered 2014-08-20 – 2014-08-22 (×6): 1 mg via INTRAVENOUS
  Filled 2014-08-20 (×6): qty 1

## 2014-08-20 MED ORDER — METOCLOPRAMIDE HCL 5 MG/ML IJ SOLN: 5.0000 mg | Freq: Three times a day (TID) | INTRAMUSCULAR | Status: DC | PRN

## 2014-08-20 MED ORDER — WARFARIN SODIUM 5 MG PO TABS
5.0000 mg | ORAL_TABLET | ORAL | Status: DC
  Administered 2014-08-21: 5 mg via ORAL
  Filled 2014-08-20 (×2): qty 1

## 2014-08-20 MED ORDER — ALLOPURINOL 300 MG PO TABS
300.0000 mg | ORAL_TABLET | Freq: Every day | ORAL | Status: DC
  Administered 2014-08-20 – 2014-08-21 (×2): 300 mg via ORAL
  Filled 2014-08-20 (×3): qty 1

## 2014-08-20 MED ORDER — FENTANYL CITRATE 0.05 MG/ML IJ SOLN
INTRAMUSCULAR | Status: DC | PRN
  Administered 2014-08-20: 50 ug via INTRAVENOUS
  Administered 2014-08-20: 75 ug via INTRAVENOUS
  Administered 2014-08-20: 50 ug via INTRAVENOUS
  Administered 2014-08-20: 75 ug via INTRAVENOUS

## 2014-08-20 MED ORDER — OXYCODONE-ACETAMINOPHEN 10-325 MG PO TABS: 1.0000 | ORAL_TABLET | ORAL | Status: DC | PRN

## 2014-08-20 MED ORDER — WARFARIN SODIUM 5 MG PO TABS: 5.0000 mg | ORAL_TABLET | Freq: Every day | ORAL | Status: DC

## 2014-08-20 MED ORDER — METHOCARBAMOL 500 MG PO TABS
500.0000 mg | ORAL_TABLET | Freq: Four times a day (QID) | ORAL | Status: DC | PRN
  Administered 2014-08-20 – 2014-08-21 (×4): 500 mg via ORAL
  Filled 2014-08-20 (×6): qty 1

## 2014-08-20 MED ORDER — POTASSIUM CHLORIDE CRYS ER 20 MEQ PO TBCR
20.0000 meq | EXTENDED_RELEASE_TABLET | Freq: Every day | ORAL | Status: DC
  Administered 2014-08-20 – 2014-08-22 (×3): 20 meq via ORAL
  Filled 2014-08-20 (×4): qty 1

## 2014-08-20 MED ORDER — CLOPIDOGREL BISULFATE 75 MG PO TABS
75.0000 mg | ORAL_TABLET | Freq: Every day | ORAL | Status: DC
  Administered 2014-08-21 – 2014-08-22 (×2): 75 mg via ORAL
  Filled 2014-08-20 (×3): qty 1

## 2014-08-20 MED ORDER — FENTANYL CITRATE 0.05 MG/ML IJ SOLN
25.0000 ug | INTRAMUSCULAR | Status: AC | PRN
  Administered 2014-08-20 (×6): 25 ug via INTRAVENOUS

## 2014-08-20 MED ORDER — IPRATROPIUM-ALBUTEROL 0.5-2.5 (3) MG/3ML IN SOLN
3.0000 mL | Freq: Four times a day (QID) | RESPIRATORY_TRACT | Status: DC
  Filled 2014-08-20: qty 3

## 2014-08-20 MED ORDER — ONDANSETRON HCL 4 MG/2ML IJ SOLN
INTRAMUSCULAR | Status: AC
  Filled 2014-08-20: qty 2

## 2014-08-20 MED ORDER — EPHEDRINE SULFATE 50 MG/ML IJ SOLN
INTRAMUSCULAR | Status: DC | PRN
  Administered 2014-08-20 (×3): 10 mg via INTRAVENOUS

## 2014-08-20 MED ORDER — FENTANYL CITRATE 0.05 MG/ML IJ SOLN
INTRAMUSCULAR | Status: AC
  Filled 2014-08-20: qty 5

## 2014-08-20 MED ORDER — OXYCODONE HCL 20 MG PO TABS: 20.0000 mg | ORAL_TABLET | Freq: Four times a day (QID) | ORAL | Status: DC | PRN

## 2014-08-20 MED ORDER — ALBUTEROL SULFATE ER 4 MG PO TB12
4.0000 mg | ORAL_TABLET | Freq: Two times a day (BID) | ORAL | Status: DC
  Administered 2014-08-20 – 2014-08-22 (×4): 4 mg via ORAL
  Filled 2014-08-20 (×5): qty 1

## 2014-08-20 MED ORDER — ISOSORBIDE MONONITRATE ER 30 MG PO TB24
30.0000 mg | ORAL_TABLET | Freq: Every day | ORAL | Status: DC
  Administered 2014-08-21 – 2014-08-22 (×2): 30 mg via ORAL
  Filled 2014-08-20 (×2): qty 1

## 2014-08-20 MED ORDER — OXYCODONE-ACETAMINOPHEN 5-325 MG PO TABS: 1.0000 | ORAL_TABLET | ORAL | Status: DC | PRN

## 2014-08-20 MED ORDER — WARFARIN - PHYSICIAN DOSING INPATIENT: Freq: Every day | Status: DC

## 2014-08-20 MED ORDER — ROSUVASTATIN CALCIUM 20 MG PO TABS
20.0000 mg | ORAL_TABLET | Freq: Every day | ORAL | Status: DC
  Administered 2014-08-20 – 2014-08-21 (×2): 20 mg via ORAL
  Filled 2014-08-20 (×3): qty 1

## 2014-08-20 MED ORDER — CEFAZOLIN SODIUM-DEXTROSE 2-3 GM-% IV SOLR
2.0000 g | Freq: Once | INTRAVENOUS | Status: AC
  Administered 2014-08-20: 2 g via INTRAVENOUS

## 2014-08-20 MED ORDER — SODIUM CHLORIDE 0.9 % IV SOLN: INTRAVENOUS | Status: DC

## 2014-08-20 MED ORDER — LIDOCAINE HCL (CARDIAC) 20 MG/ML IV SOLN
INTRAVENOUS | Status: DC | PRN
  Administered 2014-08-20: 80 mg via INTRAVENOUS

## 2014-08-20 MED ORDER — RAMIPRIL 2.5 MG PO CAPS
2.5000 mg | ORAL_CAPSULE | Freq: Every day | ORAL | Status: DC
  Administered 2014-08-20 – 2014-08-22 (×3): 2.5 mg via ORAL
  Filled 2014-08-20 (×3): qty 1

## 2014-08-20 MED ORDER — PROMETHAZINE HCL 25 MG/ML IJ SOLN: 6.2500 mg | INTRAMUSCULAR | Status: DC | PRN

## 2014-08-20 MED ORDER — FENOFIBRATE 54 MG PO TABS
54.0000 mg | ORAL_TABLET | Freq: Every day | ORAL | Status: DC
  Administered 2014-08-20 – 2014-08-22 (×3): 54 mg via ORAL
  Filled 2014-08-20 (×3): qty 1

## 2014-08-20 MED ORDER — EPHEDRINE SULFATE 50 MG/ML IJ SOLN
INTRAMUSCULAR | Status: AC
  Filled 2014-08-20: qty 1

## 2014-08-20 MED ORDER — OXYCODONE HCL 5 MG PO TABS
20.0000 mg | ORAL_TABLET | Freq: Four times a day (QID) | ORAL | Status: DC | PRN
  Administered 2014-08-20 – 2014-08-21 (×4): 20 mg via ORAL
  Filled 2014-08-20 (×4): qty 4

## 2014-08-20 MED ORDER — CEFAZOLIN SODIUM 1-5 GM-% IV SOLN
1.0000 g | Freq: Four times a day (QID) | INTRAVENOUS | Status: AC
  Administered 2014-08-20 – 2014-08-21 (×3): 1 g via INTRAVENOUS
  Filled 2014-08-20 (×3): qty 50

## 2014-08-20 MED ORDER — FUROSEMIDE 40 MG PO TABS
40.0000 mg | ORAL_TABLET | Freq: Every day | ORAL | Status: DC
  Administered 2014-08-20 – 2014-08-22 (×3): 40 mg via ORAL
  Filled 2014-08-20 (×3): qty 1

## 2014-08-20 MED ORDER — LACTATED RINGERS IV SOLN
INTRAVENOUS | Status: DC | PRN
Start: 2014-08-20 — End: 2014-08-20
  Administered 2014-08-20: 07:00:00 via INTRAVENOUS

## 2014-08-20 MED ORDER — OXYCODONE-ACETAMINOPHEN 5-325 MG PO TABS
1.0000 | ORAL_TABLET | ORAL | Status: DC | PRN
  Administered 2014-08-20 – 2014-08-22 (×9): 2 via ORAL
  Filled 2014-08-20 (×9): qty 2

## 2014-08-20 MED ORDER — IPRATROPIUM-ALBUTEROL 0.5-2.5 (3) MG/3ML IN SOLN: 3.0000 mL | RESPIRATORY_TRACT | Status: DC | PRN

## 2014-08-20 MED ORDER — LIDOCAINE HCL (CARDIAC) 20 MG/ML IV SOLN
INTRAVENOUS | Status: AC
  Filled 2014-08-20: qty 5

## 2014-08-20 MED ORDER — CEFAZOLIN SODIUM-DEXTROSE 2-3 GM-% IV SOLR
INTRAVENOUS | Status: AC
  Filled 2014-08-20: qty 50

## 2014-08-20 MED ORDER — ONDANSETRON HCL 4 MG/2ML IJ SOLN
INTRAMUSCULAR | Status: DC | PRN
  Administered 2014-08-20: 4 mg via INTRAVENOUS

## 2014-08-20 MED ORDER — FENTANYL CITRATE 0.05 MG/ML IJ SOLN
INTRAMUSCULAR | Status: AC
  Administered 2014-08-20: 25 ug via INTRAVENOUS
  Filled 2014-08-20: qty 2

## 2014-08-20 MED ORDER — ONDANSETRON HCL 4 MG/2ML IJ SOLN: 4.0000 mg | Freq: Four times a day (QID) | INTRAMUSCULAR | Status: DC | PRN

## 2014-08-20 MED ORDER — WARFARIN SODIUM 7.5 MG PO TABS
7.5000 mg | ORAL_TABLET | ORAL | Status: DC
  Filled 2014-08-20: qty 1

## 2014-08-20 MED ORDER — METOPROLOL SUCCINATE ER 50 MG PO TB24
50.0000 mg | ORAL_TABLET | Freq: Every day | ORAL | Status: DC
  Administered 2014-08-21 – 2014-08-22 (×2): 50 mg via ORAL
  Filled 2014-08-20 (×2): qty 1

## 2014-08-20 MED ORDER — METOCLOPRAMIDE HCL 10 MG PO TABS: 5.0000 mg | ORAL_TABLET | Freq: Three times a day (TID) | ORAL | Status: DC | PRN

## 2014-08-20 MED ORDER — METHOCARBAMOL 500 MG PO TABS
ORAL_TABLET | ORAL | Status: AC
  Filled 2014-08-20: qty 1

## 2014-08-20 MED ORDER — DOCUSATE SODIUM 100 MG PO CAPS
100.0000 mg | ORAL_CAPSULE | Freq: Two times a day (BID) | ORAL | Status: DC
  Administered 2014-08-20 – 2014-08-22 (×5): 100 mg via ORAL
  Filled 2014-08-20 (×6): qty 1

## 2014-08-20 MED ORDER — PROPOFOL 10 MG/ML IV BOLUS
INTRAVENOUS | Status: DC | PRN
  Administered 2014-08-20: 130 mg via INTRAVENOUS

## 2014-08-20 MED ORDER — NICOTINE 14 MG/24HR TD PT24: 14.0000 mg | MEDICATED_PATCH | Freq: Every day | TRANSDERMAL | Status: DC

## 2014-08-20 MED ORDER — ONDANSETRON HCL 4 MG PO TABS: 4.0000 mg | ORAL_TABLET | Freq: Four times a day (QID) | ORAL | Status: DC | PRN

## 2014-08-20 MED ORDER — IPRATROPIUM-ALBUTEROL 18-103 MCG/ACT IN AERO
1.0000 | INHALATION_SPRAY | Freq: Four times a day (QID) | RESPIRATORY_TRACT | Status: DC
Start: 2014-08-20 — End: 2014-08-20

## 2014-08-20 MED ORDER — GLIMEPIRIDE 1 MG PO TABS
1.0000 mg | ORAL_TABLET | Freq: Two times a day (BID) | ORAL | Status: DC
  Administered 2014-08-20 – 2014-08-22 (×4): 1 mg via ORAL
  Filled 2014-08-20 (×6): qty 1

## 2014-08-20 MED ORDER — PREGABALIN 50 MG PO CAPS
50.0000 mg | ORAL_CAPSULE | Freq: Two times a day (BID) | ORAL | Status: DC
  Administered 2014-08-20 – 2014-08-22 (×5): 50 mg via ORAL
  Filled 2014-08-20 (×5): qty 1

## 2014-08-20 SURGICAL SUPPLY — 39 items
BLADE SAW RECIP 87.9 MT (BLADE) ×2 IMPLANT
BLADE SURG 21 STRL SS (BLADE) ×2 IMPLANT
BNDG COHESIVE 6X5 TAN STRL LF (GAUZE/BANDAGES/DRESSINGS) ×3 IMPLANT
BNDG GAUZE ELAST 4 BULKY (GAUZE/BANDAGES/DRESSINGS) ×3 IMPLANT
COVER SURGICAL LIGHT HANDLE (MISCELLANEOUS) ×2 IMPLANT
CUFF TOURNIQUET SINGLE 34IN LL (TOURNIQUET CUFF) IMPLANT
CUFF TOURNIQUET SINGLE 44IN (TOURNIQUET CUFF) IMPLANT
DRAPE EXTREMITY T 121X128X90 (DRAPE) ×2 IMPLANT
DRAPE PROXIMA HALF (DRAPES) ×4 IMPLANT
DRAPE U-SHAPE 47X51 STRL (DRAPES) ×2 IMPLANT
DRSG ADAPTIC 3X8 NADH LF (GAUZE/BANDAGES/DRESSINGS) ×2 IMPLANT
DRSG PAD ABDOMINAL 8X10 ST (GAUZE/BANDAGES/DRESSINGS) ×2 IMPLANT
DURAPREP 26ML APPLICATOR (WOUND CARE) ×2 IMPLANT
ELECT REM PT RETURN 9FT ADLT (ELECTROSURGICAL) ×2
ELECTRODE REM PT RTRN 9FT ADLT (ELECTROSURGICAL) ×1 IMPLANT
GAUZE SPONGE 4X4 12PLY STRL (GAUZE/BANDAGES/DRESSINGS) ×2 IMPLANT
GLOVE BIO SURGEON STRL SZ7 (GLOVE) ×1 IMPLANT
GLOVE BIOGEL PI IND STRL 7.5 (GLOVE) IMPLANT
GLOVE BIOGEL PI IND STRL 9 (GLOVE) ×1 IMPLANT
GLOVE BIOGEL PI INDICATOR 7.5 (GLOVE) ×1
GLOVE BIOGEL PI INDICATOR 9 (GLOVE) ×1
GLOVE SURG ORTHO 9.0 STRL STRW (GLOVE) ×3 IMPLANT
GOWN STRL REUS W/ TWL XL LVL3 (GOWN DISPOSABLE) ×2 IMPLANT
GOWN STRL REUS W/TWL XL LVL3 (GOWN DISPOSABLE) ×4
KIT BASIN OR (CUSTOM PROCEDURE TRAY) ×2 IMPLANT
KIT ROOM TURNOVER OR (KITS) ×2 IMPLANT
MANIFOLD NEPTUNE II (INSTRUMENTS) ×2 IMPLANT
NS IRRIG 1000ML POUR BTL (IV SOLUTION) ×2 IMPLANT
PACK GENERAL/GYN (CUSTOM PROCEDURE TRAY) ×2 IMPLANT
PAD ARMBOARD 7.5X6 YLW CONV (MISCELLANEOUS) ×4 IMPLANT
SPONGE LAP 18X18 X RAY DECT (DISPOSABLE) ×1 IMPLANT
STAPLER VISISTAT 35W (STAPLE) ×1 IMPLANT
STOCKINETTE IMPERVIOUS LG (DRAPES) ×2 IMPLANT
SUT SILK 2 0 (SUTURE) ×2
SUT SILK 2-0 18XBRD TIE 12 (SUTURE) ×1 IMPLANT
SUT VIC AB 1 CTX 27 (SUTURE) ×1 IMPLANT
TOWEL OR 17X24 6PK STRL BLUE (TOWEL DISPOSABLE) ×2 IMPLANT
TOWEL OR 17X26 10 PK STRL BLUE (TOWEL DISPOSABLE) ×2 IMPLANT
WATER STERILE IRR 1000ML POUR (IV SOLUTION) ×2 IMPLANT

## 2014-08-20 NOTE — Anesthesia Procedure Notes (Signed)
Procedure Name: LMA Insertion Date/Time: 08/20/2014 7:58 AM Performed by: Marinda Elk A Pre-anesthesia Checklist: Patient identified, Timeout performed, Emergency Drugs available, Suction available and Patient being monitored Patient Re-evaluated:Patient Re-evaluated prior to inductionOxygen Delivery Method: Circle system utilized Preoxygenation: Pre-oxygenation with 100% oxygen Intubation Type: IV induction Ventilation: Mask ventilation without difficulty LMA: LMA inserted LMA Size: 4.0 Number of attempts: 1 Placement Confirmation: breath sounds checked- equal and bilateral Tube secured with: Tape Dental Injury: Teeth and Oropharynx as per pre-operative assessment

## 2014-08-20 NOTE — Clinical Social Work Psychosocial (Signed)
Clinical Social Work Department BRIEF PSYCHOSOCIAL ASSESSMENT 08/20/2014  Patient:  Tanner Pena, Tanner Pena     Account Number:  [id number]     Admit date:  08/20/2014  Clinical Social Worker:  Hubert Azure  Date/Time:  08/20/2014 07:26 PM  Referred by:  Physician  Date Referred:  08/20/2014 Referred for  SNF Placement   Other Referral:   Interview type:  Patient Other interview type:   Patient girlfriend present at bedside.    PSYCHOSOCIAL DATA Living Status:  ALONE Admitted from facility:   Level of care:   Primary support name:  Tanner Pena ([phone]) Primary support relationship to patient:  PARTNER Degree of support available:   Good    CURRENT CONCERNS Current Concerns  Post-Acute Placement   Other Concerns:    SOCIAL WORK ASSESSMENT / PLAN CSW met with patient and girlfriend who was present at bedside. CSW introduced self and explained role. CSW explained SNF placement process and discussed d/c plan. Per patient, he has been dealing with his leg for 2-3 years, cleaning and dressing it. Patient states he decided to have the procedure done, so he can walk again. Patient reports he uses a cane at home and has the support of his daughter and girlfriend. Patient is agreeable to SNF placement and prefers [facility].   Assessment/plan status:  Other - See comment Other assessment/ plan:   CSW to submit PASARR and complete FL2 for placement.   Information/referral to community resources:    PATIENT'S/FAMILY'S RESPONSE TO PLAN OF CARE: Patient is pleasant and cooperative. Patient is optimistic about walking again and understands therapy will assist in the process.    [place], [place] Clinical Social Worker [phone]

## 2014-08-20 NOTE — Transfer of Care (Signed)
Immediate Anesthesia Transfer of Care Note  Patient: Tanner Pena  Procedure(s) Performed: Procedure(s): Revision Transmetatarsal Amputation versus Below Knee Amputation (Left)  Patient Location: PACU  Anesthesia Type:General  Level of Consciousness: awake  Airway & Oxygen Therapy: Patient Spontanous Breathing and Patient connected to nasal cannula oxygen  Post-op Assessment: Report given to PACU RN and Post -op Vital signs reviewed and stable  Post vital signs: Reviewed and stable  Complications: No apparent anesthesia complications

## 2014-08-20 NOTE — Op Note (Signed)
   Date of Surgery: 08/20/2014  INDICATIONS: Tanner Pena is a 74 y.o.-year-old male who has undergone foot salvage surgery. Patient has had progressive gangrenous changes and presents at this time for transtibial amputation.[name]  PREOPERATIVE DIAGNOSIS: Gangrene transmetatarsal amputation left foot  POSTOPERATIVE DIAGNOSIS: Same.  PROCEDURE: Transtibial amputation left  SURGEON: Sharol Given, M.D.  ANESTHESIA:  general  IV FLUIDS AND URINE: See anesthesia.  ESTIMATED BLOOD LOSS: Minimal mL.  COMPLICATIONS: None.  DESCRIPTION OF PROCEDURE: The patient was brought to the operating room and underwent a general anesthetic. After adequate levels of anesthesia were obtained patient's lower extremity was prepped using DuraPrep draped into a sterile field. A timeout was called.  A transverse incision was made 11 cm distal to the tibial tubercle. This curved proximally and a large posterior flap was created. The tibia was transected 1 cm proximal to the skin incision. The fibula was transected just proximal to the tibial incision. The tibia was beveled anteriorly. A large posterior flap was created. The sciatic nerve was pulled cut and allowed to retract. The vascular bundles were suture ligated with 2-0 silk. The deep and superficial fascial layers were closed using #1 Vicryl. The skin was closed using staples and 2-0 nylon. The wound was covered with Adaptic orthopedic sponges AB dressing Kerlix and Coban. Patient was extubated taken to the PACU in stable condition.  Meridee Score, MD [place] 8:44 AM

## 2014-08-20 NOTE — Anesthesia Preprocedure Evaluation (Addendum)
Anesthesia Evaluation  Patient identified by MRN, date of birth, ID band Patient awake    Reviewed: Allergy & Precautions, H&P , NPO status , Patient's Chart, lab work & pertinent test results  History of Anesthesia Complications Negative for: history of anesthetic complications  Airway Mallampati: II  TM Distance: >3 FB Neck ROM: Full    Dental  (+) Edentulous Upper, Edentulous Lower, Dental Advisory Given   Pulmonary shortness of breath and with exertion, sleep apnea , COPDformer smoker,  + rhonchi   + decreased breath sounds      Cardiovascular hypertension, Pt. on medications + CAD, + Past MI, + Peripheral Vascular Disease and +CHF + dysrhythmias Rhythm:Irregular Rate:Normal  Most recent ECHO 07/03/2014 Study Conclusions  - Left ventricle: The cavity size was at the upper limits of normal. Wall thickness was increased in a pattern of moderate LVH. Systolic function was mildly reduced. The estimated ejection fraction was in the range of 45% to 50%. Left ventricular diastolic function parameters were normal. - Aortic valve: A mechanical prosthesis was present. There was moderate stenosis. Valve area (Vmax): 4.01 cm^2. - Left atrium: The atrium was moderately dilated. - Right atrium: The atrium was moderately dilated.    Neuro/Psych    GI/Hepatic GERD-  Medicated and Controlled,  Endo/Other  diabetes, Type 2, Insulin Dependent  Renal/GU      Musculoskeletal   Abdominal Normal abdominal exam  (+)   Peds  Hematology   Anesthesia Other Findings   Reproductive/Obstetrics                          Anesthesia Physical Anesthesia Plan  ASA: IV  Anesthesia Plan: General LMA and General   Post-op Pain Management:    Induction:   Airway Management Planned:   Additional Equipment:   Intra-op Plan:   Post-operative Plan:   Informed Consent:   Plan Discussed with:    Anesthesia Plan Comments:         Anesthesia Quick Evaluation

## 2014-08-20 NOTE — H&P (Signed)
Tanner Pena is an 74 y.o. male.   Chief Complaint: Dehiscence left transmetatarsal amputation HPI: Patient is a 74 year old gentleman with peripheral vascular disease diabetes who has had progressive dehiscence of the transmetatarsal amputation he has failed conservative wound care.  Past Medical History  Diagnosis Date  . Diabetes mellitus 2007  . Hypertension   . Gout   . Hypercholesteremia   . Peripheral vascular disease     stents lower extremity  . Sleep apnea   . COPD (chronic obstructive pulmonary disease)   . S/P aortic valve replacement 1990    a. St. Jude  . Chronic back pain   . Dysphagia   . Neuromuscular disorder   . Peripheral neuropathy   . Critical lower limb ischemia   . CAD (coronary artery disease)     a. 05/13/14 [name] s/p overlapping DESx2 to SVG to RCA. b.  s/p CABG '90 with redo '94 & stent to RCA SVG in 2005  . Chronic toe ulcer     a. left foot  . Shortness of breath   . Myocardial infarction   . CHF (congestive heart failure)   . Stone in kidney   . GERD (gastroesophageal reflux disease)   . Arthritis     Past Surgical History  Procedure Laterality Date  . Open heart surgery  1990    prosthetic heart valve, one bypass  . Rotator cuff repair      right  . Cataract extraction      bilateral  . Coronary stent placement  2005    RCA vein graft A 3.0x13.0 TAXUS stent was then placed int he vessel a Viva 3.0x4.0 (perfusion balloon was made ready it was placed through the entire lenght of the stent  . Peripheral vascular procedures lower extremities      right external iliac  artery PTA and stenting as well as bilateral SFA intervention remotely. Repeat procedures in 2011 bilaterally  . Coronary artery bypass graft  1994    6 vessels  . [name]  06/13/2011    Procedure: Venia Minks DILATION;  Surgeon: [name], MD;  Location: [facility];  Service: Endoscopy;  Laterality: N/A;  Dilated to 56.   . Angioplasty illiac artery    . Back surgery   9485,4627    2  . Eye surgery    . Amputation Left 07/13/2014    Procedure: Transmetatarsal Amputation;  Surgeon: [name], MD;  Location: [place];  Service: Orthopedics;  Laterality: Left;  . Lower extremity angiogram N/A 02/15/2013    Procedure: LOWER EXTREMITY ANGIOGRAM;  Surgeon: [name], MD;  Location: [facility] C[facility] LAB;  Service: Cardiovascular;  Laterality: N/A;  . Left heart catheterization with coronary angiogram N/A 05/11/2014    Procedure: LEFT HEART CATHETERIZATION WITH CORONARY ANGIOGRAM;  Surgeon: [name], MD;  Location: [facility] CATH LAB;  Service: Cardiovascular;  Laterality: N/A;  . Percutaneous coronary stent intervention (pci-s) N/A 05/13/2014    Procedure: PERCUTANEOUS CORONARY STENT INTERVENTION (PCI-S);  Surgeon: [name], MD;  Location: [facility] CATH LAB;  Service: Cardiovascular;  Laterality: N/A;  . Lower extremity angiogram N/A 06/06/2014    Procedure: LOWER EXTREMITY ANGIOGRAM;  Surgeon: [name], MD;  Location: [facility] CATH LAB;  Service: Cardiovascular;  Laterality: N/A;    Family History  Problem Relation Age of Onset  . Colon cancer Neg Hx   . Liver disease Neg Hx    Social History:  reports that he quit smoking about 3  months ago. His smoking use included Cigarettes. He started smoking about 61 years ago. He has a 55 pack-year smoking history. His smokeless tobacco use includes Chew. He reports that he does not drink alcohol or use illicit drugs.  Allergies: No Known Allergies  Medications Prior to Admission  Medication Sig Dispense Refill  . acetaminophen (TYLENOL) 500 MG tablet Take 500 mg by mouth every 6 (six) hours as needed for moderate pain.    [name] albuterol (VOSPIRE ER) 4 MG 12 hr tablet Take 4 mg by mouth 2 (two) times daily.     [name] albuterol-ipratropium (COMBIVENT) 18-103 MCG/ACT inhaler Inhale 1 puff into the lungs 4 (four) times daily. Coughing/ Shortness of Breath    . allopurinol (ZYLOPRIM) 300 MG tablet Take 300 mg by mouth  at bedtime.     . clopidogrel (PLAVIX) 75 MG tablet Take 1 tablet (75 mg total) by mouth daily with breakfast. 30 tablet 11  . doxycycline (VIBRAMYCIN) 100 MG capsule Take 100 mg by mouth 2 (two) times daily.     . fenofibrate (TRICOR) 145 MG tablet Take 1 tablet (145 mg total) by mouth daily. 30 tablet 9  . fish oil-omega-3 fatty acids 1000 MG capsule Take 1 capsule (1 g total) by mouth 2 (two) times daily.    . furosemide (LASIX) 80 MG tablet Take 40 mg by mouth daily.    [name] glimepiride (AMARYL) 1 MG tablet Take 1 mg by mouth 2 (two) times daily.    . IODINE EX Apply 1 application topically 2 (two) times daily.    . isosorbide mononitrate (IMDUR) 30 MG 24 hr tablet Take 1 tablet (30 mg total) by mouth daily. 30 tablet 11  . metoprolol succinate (TOPROL-XL) 50 MG 24 hr tablet Take 1 tablet (50 mg total) by mouth daily. Take with or immediately following a meal. 30 tablet 11  . nitroGLYCERIN (NITROSTAT) 0.4 MG SL tablet Place 1 tablet (0.4 mg total) under the tongue every 5 (five) minutes as needed for chest pain. 25 tablet 12  . Oxycodone HCl 20 MG TABS Take 20 mg by mouth every 6 (six) hours as needed (for pain).    [name] oxyCODONE-acetaminophen (PERCOCET) 10-325 MG per tablet Take 1 tablet by mouth every 3 (three) hours as needed for pain.     . potassium chloride SA (K-DUR,KLOR-CON) 20 MEQ tablet Take 20 mEq by mouth daily.      . pregabalin (LYRICA) 50 MG capsule Take 50 mg by mouth 2 (two) times daily.     . ramipril (ALTACE) 2.5 MG capsule Take 2.5 mg by mouth at bedtime. 30 capsule 3  . ramipril (ALTACE) 5 MG capsule Take 5 mg by mouth in the morning. 30 capsule 3  . rosuvastatin (CRESTOR) 20 MG tablet Take 20 mg by mouth at bedtime.    . sitaGLIPtin (JANUVIA) 50 MG tablet Take 50 mg by mouth daily.    [name] warfarin (COUMADIN) 5 MG tablet Take 1-1.5 tablets (5-7.5 mg total) by mouth at bedtime. Takes one and one-half tablet (7.5mg  total) on Mondays and Fridays. Takes one tablet (5mg  total) on all  other days (Patient taking differently: Take 5-7.5 mg by mouth at bedtime. Takes one and one-half tablet (7.5mg  total) on Mondays, Wednesdays and  Fridays. Takes one tablet (5mg  total) on all other days) 30 tablet 11  . cephALEXin (KEFLEX) 500 MG capsule Take 1 capsule (500 mg total) by mouth 2 (two) times daily. (Patient not taking: Reported on 08/20/2014) 14 capsule 0  .  nicotine (NICODERM CQ - DOSED IN MG/24 HOURS) 14 mg/24hr patch Place 1 patch (14 mg total) onto the skin daily. (Patient not taking: Reported on 08/20/2014) 28 patch 0  . oxyCODONE-acetaminophen (ROXICET) 5-325 MG per tablet Take 1 tablet by mouth every 4 (four) hours as needed for severe pain. (Patient not taking: Reported on 08/20/2014) 60 tablet 0  . oxyCODONE-acetaminophen (ROXICET) 5-325 MG per tablet Take 1 tablet by mouth every 4 (four) hours as needed for severe pain. (Patient not taking: Reported on 08/20/2014) 60 tablet 0    No results found for this or any previous visit (from the past 48 hour(s)). No results found.  Review of Systems  All other systems reviewed and are negative.   Blood pressure 152/60, pulse 60, temperature 98.3 F (36.8 C), temperature source Oral, resp. rate 20, height 5\' 10"  (1.778 m), weight 96.163 kg (212 lb), SpO2 98 %. Physical Exam  On examination there is dehiscence of the transmetatarsal amputation. 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  Plan: We will plan for revision of the transmetatarsal amputation versus transtibial amputation. Risks and benefits were discussed patient states he understands and wished to proceed at this time.  DUDA,MARCUS V 08/20/2014, 6:57 AM

## 2014-08-20 NOTE — Anesthesia Postprocedure Evaluation (Signed)
  Anesthesia Post-op Note  Patient: Tanner Pena  Procedure(s) Performed: Procedure(s): Revision Transmetatarsal Amputation versus Below Knee Amputation (Left)  Patient Location: PACU  Anesthesia Type:General  Level of Consciousness: awake and alert   Airway and Oxygen Therapy: Patient Spontanous Breathing  Post-op Pain: mild  Post-op Assessment: Post-op Vital signs reviewed  Post-op Vital Signs: stable  Last Vitals:  Filed Vitals:   08/20/14 0912  BP: 172/49  Pulse: 32  Temp:   Resp: 20    Complications: No apparent anesthesia complications

## 2014-08-21 LAB — GLUCOSE, CAPILLARY
GLUCOSE-CAPILLARY: 139 mg/dL — AB (ref 70–99)
GLUCOSE-CAPILLARY: 128 mg/dL — AB (ref 70–99)
Glucose-Capillary: 139 mg/dL — ABNORMAL HIGH (ref 70–99)
Glucose-Capillary: 155 mg/dL — ABNORMAL HIGH (ref 70–99)

## 2014-08-21 LAB — PROTIME-INR
INR: 2.73 — AB (ref 0.00–1.49)
Prothrombin Time: 29.1 seconds — ABNORMAL HIGH (ref 11.6–15.2)

## 2014-08-21 NOTE — Care Management Note (Signed)
    Page 1 of 1   08/21/2014     9:03:18 AM CARE MANAGEMENT NOTE 08/21/2014  Patient:  Tanner Pena, Tanner Pena   Account Number:  [id number]  Date Initiated:  08/21/2014  Documentation initiated by:  [facility]  Subjective/Objective Assessment:   adm: Dehiscence left transmetatarsal amputation     Action/Plan:   SNF   Anticipated DC Date:  08/22/2014   Anticipated DC Plan:           Choice offered to / List presented to:             Status of service:  Completed, signed off Medicare Important Message given?   (If response is "NO", the following Medicare IM given date fields will be blank) Date Medicare IM given:   Medicare IM given by:   Date Additional Medicare IM given:   Additional Medicare IM given by:    Discharge Disposition:  [place]  Per UR Regulation:    If discussed at Long Length of Stay Meetings, dates discussed:    Comments:  08/21/14 07:50 CM notes pt to go to SNF; CSW arranging.  No other CM needs were communicated.  Mariane Masters, BSN, CM [phone].

## 2014-08-21 NOTE — Evaluation (Signed)
Physical Therapy Evaluation Patient Details Name: Tanner Pena MRN: [id number] DOB: October 24, 1940 Today's Date: 08/21/2014   History of Present Illness  74 yr old male admitted with left transmetatarsal infection, now s/p L BKA  Past Medical History  Diagnosis Date  . Diabetes mellitus 2007  . Hypertension   . Gout   . Hypercholesteremia   . Peripheral vascular disease     stents lower extremity  . Sleep apnea   . COPD (chronic obstructive pulmonary disease)   . S/P aortic valve replacement 1990    a. St. Jude  . Chronic back pain   . Dysphagia   . Neuromuscular disorder   . Peripheral neuropathy   . Critical lower limb ischemia   . CAD (coronary artery disease)     a. 05/13/14 [name] s/p overlapping DESx2 to SVG to RCA. b.  s/p CABG '90 with redo '94 & stent to RCA SVG in 2005  . Chronic toe ulcer     a. left foot  . Shortness of breath   . Myocardial infarction   . CHF (congestive heart failure)   . Stone in kidney   . GERD (gastroesophageal reflux disease)   . Arthritis    Past Surgical History  Procedure Laterality Date  . Open heart surgery  1990    prosthetic heart valve, one bypass  . Rotator cuff repair      right  . Cataract extraction      bilateral  . Coronary stent placement  2005    RCA vein graft A 3.0x13.0 TAXUS stent was then placed int he vessel a Viva 3.0x4.0 (perfusion balloon was made ready it was placed through the entire lenght of the stent  . Peripheral vascular procedures lower extremities      right external iliac  artery PTA and stenting as well as bilateral SFA intervention remotely. Repeat procedures in 2011 bilaterally  . Coronary artery bypass graft  1994    6 vessels  . [name]  06/13/2011    Procedure: Venia Minks DILATION;  Surgeon: [name], MD;  Location: [facility];  Service: Endoscopy;  Laterality: N/A;  Dilated to 56.   . Angioplasty illiac artery    . Back surgery  0623,7628    2  . Eye surgery    . Amputation Left  07/13/2014    Procedure: Transmetatarsal Amputation;  Surgeon: [name], MD;  Location: [place];  Service: Orthopedics;  Laterality: Left;  . Lower extremity angiogram N/A 02/15/2013    Procedure: LOWER EXTREMITY ANGIOGRAM;  Surgeon: [name], MD;  Location: [facility] CATH LAB;  Service: Cardiovascular;  Laterality: N/A;  . Left heart catheterization with coronary angiogram N/A 05/11/2014    Procedure: LEFT HEART CATHETERIZATION WITH CORONARY ANGIOGRAM;  Surgeon: [name], MD;  Location: [facility] CATH LAB;  Service: Cardiovascular;  Laterality: N/A;  . Percutaneous coronary stent intervention (pci-s) N/A 05/13/2014    Procedure: PERCUTANEOUS CORONARY STENT INTERVENTION (PCI-S);  Surgeon: [name], MD;  Location: [facility] CATH LAB;  Service: Cardiovascular;  Laterality: N/A;  . Lower extremity angiogram N/A 06/06/2014    Procedure: LOWER EXTREMITY ANGIOGRAM;  Surgeon: [name], MD;  Location: [facility] CATH LAB;  Service: Cardiovascular;  Laterality: N/A;     Clinical Impression  Patient is s/p above surgery resulting in functional limitations due to the deficits listed below (see PT Problem List).  Patient will benefit from skilled PT to increase their independence and safety with mobility to allow discharge  to the venue listed below.       Follow Up Recommendations SNF;Supervision/Assistance - 24 hour    Equipment Recommendations  Rolling walker with 5" wheels;3in1 (PT)    Recommendations for Other Services       Precautions / Restrictions Precautions Precautions: None Restrictions LLE Weight Bearing: Non weight bearing      Mobility  Bed Mobility Overal bed mobility: Modified Independent                Transfers Overall transfer level: Needs assistance Equipment used: Rolling walker (2 wheeled) Transfers: Sit to/from Stand Sit to Stand: Min guard         General transfer comment: cues for technqique  Ambulation/Gait Ambulation/Gait assistance:  Min guard Ambulation Distance (Feet): 25 Feet Assistive device: Rolling walker (2 wheeled) Gait Pattern/deviations: Step-to pattern     General Gait Details: Cues to more press body weight into RW to acvance RLE than hop; managing quite well  Stairs            Wheelchair Mobility    Modified Rankin (Stroke Patients Only)       Balance Overall balance assessment: Needs assistance   Sitting balance-Leahy Scale: Good       Standing balance-Leahy Scale: Poor                               Pertinent Vitals/Pain Pain Assessment: Faces Faces Pain Scale: Hurts little more Pain Location: L LE with work on knee extension/hamstring stretch Pain Descriptors / Indicators: Aching;Grimacing Pain Intervention(s): Repositioned    Home Living Family/patient expects to be discharged to:: Private residence Living Arrangements: Alone Available Help at Discharge: Family;Available PRN/intermittently Type of Home: House Home Access: Stairs to enter Entrance Stairs-Rails: None Entrance Stairs-Number of Steps: 2 Home Layout: One level Home Equipment: Crutches      Prior Function Level of Independence: Independent         Comments: 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; very active (per note from 11/15)     Hand Dominance        Extremity/Trunk Assessment   Upper Extremity Assessment: Overall WFL for tasks assessed           Lower Extremity Assessment: LLE deficits/detail   LLE Deficits / Details: s/p BKA; noted hamstring tightness and difficulty getting L knee fully straight in sitting     Communication   Communication: No difficulties  Cognition Arousal/Alertness: Awake/alert Behavior During Therapy: WFL for tasks assessed/performed Overall Cognitive Status: Within Functional Limits for tasks assessed                      General Comments      Exercises Amputee Exercises Quad Sets: AROM;Both;10 reps Towel Squeeze: AAROM;Both;10  reps Hip Extension: AROM;Left;5 reps;Standing      Assessment/Plan    PT Assessment Patient needs continued PT services  PT Diagnosis Difficulty walking   PT Problem List Decreased strength;Decreased range of motion;Decreased activity tolerance;Decreased balance;Decreased mobility;Decreased knowledge of use of DME;Decreased knowledge of precautions;Pain  PT Treatment Interventions DME instruction;Gait training;Stair training;Functional mobility training;Therapeutic activities;Therapeutic exercise;Patient/family education   PT Goals (Current goals can be found in the Care Plan section) Acute Rehab PT Goals Patient Stated Goal: be able to use prosthesis PT Goal Formulation: With patient Time For Goal Achievement: 08/21/14 Potential to Achieve Goals: Good    Frequency Min 3X/week   Barriers to discharge  Co-evaluation               End of Session Equipment Utilized During Treatment: Gait belt Activity Tolerance: Patient tolerated treatment well Patient left: in chair Nurse Communication: Mobility status         Time: 9604-5409 PT Time Calculation (min) (ACUTE ONLY): 20 min   Charges:   PT Evaluation $Initial PT Evaluation Tier I: 1 Procedure PT Treatments $Gait Training: 8-22 mins   PT G Codes:        Quin Hoop 08/21/2014, 5:17 PM Roney Marion, [place] Pager [phone] Office [phone]

## 2014-08-21 NOTE — Clinical Social Work Placement (Addendum)
Clinical Social Work Department CLINICAL SOCIAL WORK PLACEMENT NOTE 08/21/2014  Patient:  Tanner Pena, Tanner Pena  Account Number:  [id number] [place] date:  08/20/2014  Clinical Social Worker:  Carrington Clamp, [place]  Date/time:  08/21/2014 10:04 AM  Clinical Social Work is seeking post-discharge placement for this patient at the following level of care:   [place]   (*CSW will update this form in Epic as items are completed)   08/20/2014  Patient/family provided with [place] Department of Clinical Social Work's list of facilities offering this level of care within the geographic area requested by the patient (or if unable, by the patient's family).  08/20/2014  Patient/family informed of their freedom to choose among providers that offer the needed level of care, that participate in Medicare, Medicaid or managed care program needed by the patient, have an available bed and are willing to accept the patient.  08/20/2014  Patient/family informed of [facility]' ownership interest in [facility], as well as of the fact that they are under no obligation to receive care at this facility.  PASARR submitted to EDS on 08/21/2014 PASARR number received on 08/22/2013  FL2 transmitted to all facilities in geographic area requested by pt/family on  08/21/2014 FL2 transmitted to all facilities within larger geographic area on   Patient informed that his/her managed care company has contracts with or will negotiate with  certain facilities, including the following:     Patient/family informed of bed offers received:  08/23/2013 Patient chooses bed at [facility] Physician recommends and patient chooses bed at  n/a  Patient to be transferred to  [facility]  on 08/23/2013  Patient to be transferred to facility by [facility] Patient and family notified of transfer on 08/23/2013 Name of family member notified:  Museum/gallery conservator, daughter  The following physician request were entered in  Epic:   Additional Comments:  Chiropodist, [place] Weekend Clinical Social Worker [phone]

## 2014-08-21 NOTE — Progress Notes (Signed)
Patient ID: Tanner Pena, male   DOB: Mar 11, 1941, 74 y.o.   MRN: [id number] Postoperative day 1 transtibial amputation on the left. Patient has very little swelling he does complain of some pain. Anticipate discharge to skilled nursing. Physical therapy progressive ambulation.

## 2014-08-22 ENCOUNTER — Encounter (HOSPITAL_COMMUNITY): Payer: Self-pay | Admitting: Orthopedic Surgery

## 2014-08-22 ENCOUNTER — Inpatient Hospital Stay
Admission: RE | Admit: 2014-08-22 | Discharge: 2014-09-07 | Disposition: A | Discharge status: Home / Self Care | Payer: Medicaid Other | Source: Ambulatory Visit | Attending: Internal Medicine | Admitting: Internal Medicine

## 2014-08-22 DIAGNOSIS — E1151 Type 2 diabetes mellitus with diabetic peripheral angiopathy without gangrene: Secondary | ICD-10-CM | POA: Diagnosis not present

## 2014-08-22 DIAGNOSIS — M1000 Idiopathic gout, unspecified site: Secondary | ICD-10-CM | POA: Diagnosis not present

## 2014-08-22 DIAGNOSIS — Z89512 Acquired absence of left leg below knee: Secondary | ICD-10-CM | POA: Diagnosis not present

## 2014-08-22 DIAGNOSIS — R262 Difficulty in walking, not elsewhere classified: Secondary | ICD-10-CM | POA: Diagnosis not present

## 2014-08-22 DIAGNOSIS — M6281 Muscle weakness (generalized): Secondary | ICD-10-CM | POA: Diagnosis not present

## 2014-08-22 DIAGNOSIS — L03116 Cellulitis of left lower limb: Secondary | ICD-10-CM | POA: Diagnosis not present

## 2014-08-22 DIAGNOSIS — E119 Type 2 diabetes mellitus without complications: Secondary | ICD-10-CM | POA: Diagnosis not present

## 2014-08-22 DIAGNOSIS — Z7901 Long term (current) use of anticoagulants: Secondary | ICD-10-CM | POA: Diagnosis not present

## 2014-08-22 DIAGNOSIS — I739 Peripheral vascular disease, unspecified: Secondary | ICD-10-CM | POA: Diagnosis not present

## 2014-08-22 DIAGNOSIS — J449 Chronic obstructive pulmonary disease, unspecified: Secondary | ICD-10-CM | POA: Diagnosis not present

## 2014-08-22 DIAGNOSIS — T8744 Infection of amputation stump, left lower extremity: Secondary | ICD-10-CM | POA: Diagnosis not present

## 2014-08-22 DIAGNOSIS — R278 Other lack of coordination: Secondary | ICD-10-CM | POA: Diagnosis not present

## 2014-08-22 DIAGNOSIS — S88012A Complete traumatic amputation at knee level, left lower leg, initial encounter: Secondary | ICD-10-CM | POA: Diagnosis not present

## 2014-08-22 DIAGNOSIS — M25562 Pain in left knee: Secondary | ICD-10-CM | POA: Diagnosis not present

## 2014-08-22 DIAGNOSIS — I255 Ischemic cardiomyopathy: Secondary | ICD-10-CM | POA: Diagnosis not present

## 2014-08-22 DIAGNOSIS — I10 Essential (primary) hypertension: Secondary | ICD-10-CM | POA: Diagnosis not present

## 2014-08-22 DIAGNOSIS — Z954 Presence of other heart-valve replacement: Secondary | ICD-10-CM | POA: Diagnosis not present

## 2014-08-22 LAB — PROTIME-INR
INR: 1.9 — AB (ref 0.00–1.49)
Prothrombin Time: 22 seconds — ABNORMAL HIGH (ref 11.6–15.2)

## 2014-08-22 LAB — GLUCOSE, CAPILLARY
GLUCOSE-CAPILLARY: 153 mg/dL — AB (ref 70–99)
Glucose-Capillary: 168 mg/dL — ABNORMAL HIGH (ref 70–99)

## 2014-08-22 MED ORDER — ASPIRIN EC 325 MG PO TBEC: 325.0000 mg | DELAYED_RELEASE_TABLET | Freq: Every day | ORAL | Status: DC

## 2014-08-22 MED ORDER — OXYCODONE-ACETAMINOPHEN 5-325 MG PO TABS: 1.0000 | ORAL_TABLET | ORAL | Status: DC | PRN

## 2014-08-22 NOTE — Progress Notes (Signed)
Patient ID: Tanner Pena, male   DOB: 1941-01-20, 74 y.o.   MRN: [id number] Postoperative day 2 left transtibial amputation. Patient plan for discharge to skilled nursing facility placement is underway. Patient is resting comfortably this morning.

## 2014-08-22 NOTE — Clinical Social Work Note (Addendum)
Per MD order, patient to dc today to SNF. Patient will discharge to [facility] SNF RN to call report to [phone] prior to transportation (scheduled for 2:30pm) Transportation: [facility]  CSW updated daughter, Water quality scientist.  12:22pm- CSW met with patient and daughter and explained that [facility] and [facility] SNF are both currently reviewing patient's clinicals in order to make a bed offer.  [facility] both has bed availability.  Patient will need [facility] transportation.  DC summary has been sent to both reviewing facilities.  CSW awaiting a return call from facilities with confirmation of firm bed offer.  Nonnie Done, [place] [phone]  Psychiatric & Orthopedics (5N 1-16) Clinical Social Worker

## 2014-08-22 NOTE — Discharge Summary (Signed)
Physician Discharge Summary  Patient ID: Tanner Pena MRN: [id number] DOB/AGE: 74-05-1941 74 y.o.  Admit date: 08/20/2014 Discharge date: 08/22/2014  Admission Diagnoses: Osteomyelitis ulceration left lower extremity  Discharge Diagnoses: Same Active Problems:   Below knee amputation status   Discharged Condition: stable  Hospital Course: Patient's hospital course was essentially unremarkable. He underwent a transtibial 8 dictation the left. Postoperatively he progressed slowly and was discharged to skilled nursing in stable condition.  Consults: None  Significant Diagnostic Studies: labs: Routine labs  Treatments: surgery: See operative note  Discharge Exam: Blood pressure 133/44, pulse 47, temperature 98.4 F (36.9 C), temperature source Oral, resp. rate 18, height 5\' 10"  (1.778 m), weight 96.163 kg (212 lb), SpO2 98 %. Incision/Wound: dressing clean dry and intact  Disposition: ED Dismiss - Diverted Elsewhere  Discharge Instructions    Call MD / Call 911    Complete by:  As directed   If you experience chest pain or shortness of breath, CALL 911 and be transported to the hospital emergency room.  If you develope a fever above 101 F, pus (white drainage) or increased drainage or redness at the wound, or calf pain, call your surgeon's office.     Constipation Prevention    Complete by:  As directed   Drink plenty of fluids.  Prune juice may be helpful.  You may use a stool softener, such as Colace (over the counter) 100 mg twice a day.  Use MiraLax (over the counter) for constipation as needed.     Diet - low sodium heart healthy    Complete by:  As directed      Increase activity slowly as tolerated    Complete by:  As directed      Non weight bearing    Complete by:  As directed   Laterality:  left  Extremity:  Lower            Medication List    TAKE these medications        acetaminophen 500 MG tablet  Commonly known as:  TYLENOL  Take 500 mg by mouth  every 6 (six) hours as needed for moderate pain.     albuterol 4 MG 12 hr tablet  Commonly known as:  VOSPIRE ER  Take 4 mg by mouth 2 (two) times daily.     albuterol-ipratropium 18-103 MCG/ACT inhaler  Commonly known as:  COMBIVENT  Inhale 1 puff into the lungs 4 (four) times daily. Coughing/ Shortness of Breath     allopurinol 300 MG tablet  Commonly known as:  ZYLOPRIM  Take 300 mg by mouth at bedtime.     aspirin EC 325 MG tablet  Take 1 tablet (325 mg total) by mouth daily.     cephALEXin 500 MG capsule  Commonly known as:  KEFLEX  Take 1 capsule (500 mg total) by mouth 2 (two) times daily.     clopidogrel 75 MG tablet  Commonly known as:  PLAVIX  Take 1 tablet (75 mg total) by mouth daily with breakfast.     doxycycline 100 MG capsule  Commonly known as:  VIBRAMYCIN  Take 100 mg by mouth 2 (two) times daily.     fenofibrate 145 MG tablet  Commonly known as:  TRICOR  Take 1 tablet (145 mg total) by mouth daily.     fish oil-omega-3 fatty acids 1000 MG capsule  Take 1 capsule (1 g total) by mouth 2 (two) times daily.     furosemide 80  MG tablet  Commonly known as:  LASIX  Take 40 mg by mouth daily.     glimepiride 1 MG tablet  Commonly known as:  AMARYL  Take 1 mg by mouth 2 (two) times daily.     IODINE EX  Apply 1 application topically 2 (two) times daily.     isosorbide mononitrate 30 MG 24 hr tablet  Commonly known as:  IMDUR  Take 1 tablet (30 mg total) by mouth daily.     metoprolol succinate 50 MG 24 hr tablet  Commonly known as:  TOPROL-XL  Take 1 tablet (50 mg total) by mouth daily. Take with or immediately following a meal.     nicotine 14 mg/24hr patch  Commonly known as:  NICODERM CQ - dosed in mg/24 hours  Place 1 patch (14 mg total) onto the skin daily.     nitroGLYCERIN 0.4 MG SL tablet  Commonly known as:  NITROSTAT  Place 1 tablet (0.4 mg total) under the tongue every 5 (five) minutes as needed for chest pain.     Oxycodone HCl 20  MG Tabs  Take 20 mg by mouth every 6 (six) hours as needed (for pain).     oxyCODONE-acetaminophen 10-325 MG per tablet  Commonly known as:  PERCOCET  Take 1 tablet by mouth every 3 (three) hours as needed for pain.     oxyCODONE-acetaminophen 5-325 MG per tablet  Commonly known as:  ROXICET  Take 1 tablet by mouth every 4 (four) hours as needed for severe pain.     oxyCODONE-acetaminophen 5-325 MG per tablet  Commonly known as:  ROXICET  Take 1 tablet by mouth every 4 (four) hours as needed for severe pain.     oxyCODONE-acetaminophen 5-325 MG per tablet  Commonly known as:  ROXICET  Take 1 tablet by mouth every 4 (four) hours as needed for severe pain.     potassium chloride SA 20 MEQ tablet  Commonly known as:  K-DUR,KLOR-CON  Take 20 mEq by mouth daily.     pregabalin 50 MG capsule  Commonly known as:  LYRICA  Take 50 mg by mouth 2 (two) times daily.     ramipril 5 MG capsule  Commonly known as:  ALTACE  Take 5 mg by mouth in the morning.     ramipril 2.5 MG capsule  Commonly known as:  ALTACE  Take 2.5 mg by mouth at bedtime.     rosuvastatin 20 MG tablet  Commonly known as:  CRESTOR  Take 20 mg by mouth at bedtime.     sitaGLIPtin 50 MG tablet  Commonly known as:  JANUVIA  Take 50 mg by mouth daily.     warfarin 5 MG tablet  Commonly known as:  COUMADIN  Take 1-1.5 tablets (5-7.5 mg total) by mouth at bedtime. Takes one and one-half tablet (7.5mg  total) on Mondays and Fridays. Takes one tablet (5mg  total) on all other days           Follow-up Information    Follow up with DUDA,MARCUS V, MD In 2 weeks.   Specialty:  Orthopedic Surgery   Contact information:   [place] [address] [phone]       Signed: [name] 08/22/2014, 6:55 AM

## 2014-08-22 NOTE — Progress Notes (Signed)
CARE MANAGEMENT NOTE 08/22/2014  Patient:  Tanner Pena, Tanner Pena   Account Number:  [id number]  Date Initiated:  08/21/2014  Documentation initiated by:  [facility]  Subjective/Objective Assessment:   adm: Dehiscence left transmetatarsal amputation     Action/Plan:   SNF   Anticipated DC Date:  08/22/2014   Anticipated DC Plan:  SKILLED NURSING FACILITY  In-house referral  Clinical Social Worker      DC Planning Services  CM consult      Choice offered to / List presented to:             Status of service:  Completed, signed off Medicare Important Message given?  YES (If response is "NO", the following Medicare IM given date fields will be blank) Date Medicare IM given:  08/22/2014 Medicare IM given by:  [facility] Date Additional Medicare IM given:   Additional Medicare IM given by:    Discharge Disposition:  [employer]  Per UR Regulation:    If discussed at Long Length of Stay Meetings, dates discussed:    Comments:  08/21/14 07:50 CM notes pt to go to SNF; CSW arranging.  No other CM needs were communicated.  Mariane Masters, BSN, CM [phone].

## 2014-08-22 NOTE — Progress Notes (Signed)
Utilization review completed. Matteo Banke, RN, BSN. 

## 2014-08-23 ENCOUNTER — Other Ambulatory Visit: Payer: Self-pay | Admitting: *Deleted

## 2014-08-23 LAB — GLUCOSE, CAPILLARY
Glucose-Capillary: 171 mg/dL — ABNORMAL HIGH (ref 70–99)
Glucose-Capillary: 145 mg/dL — ABNORMAL HIGH (ref 70–99)
Glucose-Capillary: 185 mg/dL — ABNORMAL HIGH (ref 70–99)

## 2014-08-23 MED ORDER — PREGABALIN 50 MG PO CAPS: ORAL_CAPSULE | ORAL | Status: DC

## 2014-08-23 NOTE — Telephone Encounter (Signed)
[facility] 

## 2014-08-24 ENCOUNTER — Non-Acute Institutional Stay (SKILLED_NURSING_FACILITY): Payer: Medicare Other | Admitting: Internal Medicine

## 2014-08-24 DIAGNOSIS — L03116 Cellulitis of left lower limb: Secondary | ICD-10-CM | POA: Diagnosis not present

## 2014-08-24 DIAGNOSIS — T8744 Infection of amputation stump, left lower extremity: Secondary | ICD-10-CM

## 2014-08-24 DIAGNOSIS — E1151 Type 2 diabetes mellitus with diabetic peripheral angiopathy without gangrene: Secondary | ICD-10-CM

## 2014-08-24 LAB — GLUCOSE, CAPILLARY
GLUCOSE-CAPILLARY: 119 mg/dL — AB (ref 70–99)
Glucose-Capillary: 153 mg/dL — ABNORMAL HIGH (ref 70–99)

## 2014-08-25 LAB — GLUCOSE, CAPILLARY
Glucose-Capillary: 84 mg/dL (ref 70–99)
Glucose-Capillary: 190 mg/dL — ABNORMAL HIGH (ref 70–99)

## 2014-08-26 ENCOUNTER — Telehealth: Payer: Self-pay | Admitting: Cardiovascular Disease

## 2014-08-26 LAB — GLUCOSE, CAPILLARY: GLUCOSE-CAPILLARY: 100 mg/dL — AB (ref 70–99)

## 2014-08-26 NOTE — Telephone Encounter (Signed)
Pt called in stating that a point of care certification was faxed to our office on 12/2 for Dr. [name] to sign but she has not received a response. Please call  Thanks

## 2014-08-26 NOTE — Telephone Encounter (Signed)
Spoke to [place], informed her to refax form to office  Will have Dr [name] sign and fax  Back.she verbalized understanding. Form given to [facility] for Dr [name]

## 2014-08-27 LAB — GLUCOSE, CAPILLARY
Glucose-Capillary: 161 mg/dL — ABNORMAL HIGH (ref 70–99)
Glucose-Capillary: 123 mg/dL — ABNORMAL HIGH (ref 70–99)

## 2014-08-28 LAB — GLUCOSE, CAPILLARY
GLUCOSE-CAPILLARY: 123 mg/dL — AB (ref 70–99)
GLUCOSE-CAPILLARY: 188 mg/dL — AB (ref 70–99)

## 2014-08-28 NOTE — Progress Notes (Addendum)
Patient ID: Tanner Pena, male   DOB: January 25, 1941, 74 y.o.   MRN: [id number]               HISTORY & PHYSICAL  DATE:  08/24/2014        FACILITY: [facility]    LEVEL OF CARE:   SNF   CHIEF COMPLAINT:  Admission to SNF, post stay at [facility], 08/20/2014 through 08/22/2014.    HISTORY OF PRESENT ILLNESS:  This is a patient with multiple medical problems who was admitted for osteomyelitis and ulceration of his left lower extremity.  He underwent a left below-knee amputation.    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  He was referred to a vascular surgeon in [place].  He had undergone a transmetatarsal amputation by Dr. Sharol Given on 07/13/2014 and then, apparently, there was failure here and he was readmitted for definitive surgery.    PAST MEDICAL HISTORY/PROBLEM LIST:                Coronary artery disease.  Status post bypass grafting in the 1990s with a St. Jude artificial valve at that time.  He has been on chronic Coumadin.   He had a re-do in 1994.    Severe PAD with stenting of his SFAs bilaterally by Dr. [name], as well as right external iliac.    Hypertension.    Hyperlipidemia.    Type 2 diabetes.    Obstructive sleep apnea.    Carotid artery disease.    Nonhealing ulcer on his left great toe.  Now status post left BKA.    LABS/RADIOLOGY:  Last echocardiogram on 05/08/2014 showed an EF of 40-45%, with a mechanical aortic valve prosthesis.    CURRENT MEDICATIONS:  Discharge medications include:       Combivent 2 puffs four times a day.    Allopurinol 300 daily.    Enteric-coated aspirin 325 q.d.    Keflex 500 by mouth twice daily.    Plavix 75 q.d.    Doxycycline 100 mg two times daily.    Tricor 145 daily.    Lasix 40 mg daily.    Glimepiride 1 mg tablet two times a day.    Imdur 30 q.d.    Metoprolol succinate 50 mg once a day.    Nicotine  patch 14 mg daily.    Nitroglycerin p.r.n.    Oxycodone 20 mg q.6 p.r.n.     K-dur 20 mEq daily.    Lyrica 50 mg b.i.d.    Altace 5 mg in the morning, 2-1/2 at bedtime.    Crestor 20 q.d.    Januvia 50 q.d.    Coumadin 5 mg.  He came to [place] with an INR of 1.9.  He received one dose of 7.5.  His INR today is 2.49.    SOCIAL HISTORY:            HOUSING:  The patient lives in his own home.  It is apparently wheelchair-accessible.   TOBACCO USE:  He quit smoking in September 2015.    REVIEW OF SYSTEMS:   CHEST/RESPIRATORY:  No shortness of breath.  CARDIAC:   No chest pain.    GI:  No nausea or vomiting.  He has an umbilical hernia, which he has had for years.   MUSCULOSKELETAL:  Extremities:  Still complaining of significant pain in the left stump.    PHYSICAL  EXAMINATION:   GENERAL APPEARANCE:  Pleasant man in no distress.  Alert and attentive.   CHEST/RESPIRATORY:  Clear air entry bilaterally.    CARDIOVASCULAR:  CARDIAC:  Heart sounds are irregular.  He has a mechanical second sound.  There are no signs of heart failure.   EDEMA/VARICOSITIES:  Scant coccyx edema noted.   GASTROINTESTINAL:  ABDOMEN:   Mildly distended.   LIVER/SPLEEN/KIDNEYS:  No liver, no spleen.   HERNIA:  He has a periumbilical hernia that reduces easily.   GENITOURINARY:  BLADDER:   No bladder distention.   MUSCULOSKELETAL:   EXTREMITIES:   RIGHT LOWER EXTREMITY:  In his remaining right leg, there is a good distal pulse at the dorsalis pedis.  No wounds here.   LEFT LOWER EXTREMITY:  His left BK amputation site does not look that good, however.  There is a dusky subdermal hemorrhage and edema around the actual incision.  Medially, there is edema which is tender from the medial aspect of the knee at the incision site.  I think this is cellulitis.  I have marked this area.    ASSESSMENT/PLAN:                     Diabetic peripheral vascular disease.  Last hemoglobin A1c I see was 6.8 on 05/09/2014.   This would indicate adequate control.    Status post left BKA.  I am not completely happy with the condition of the stump, especially medially where there appears to be some cellulitis streaking up from the medial aspect of his incision.  Also concerning is the dusky subdermal hemorrhage.  There may be ongoing ischemia here.  We will have to see how this progresses   History of coronary artery disease and aortic valve repair.  His INR today is 2.49.  I am going to start him back on Coumadin 5 mg and repeat him on Thursday.  Goal INR is 2.5 to 3.    On Keflex and doxycycline for reasons that are not totally clear.  I am going to increase the dose of the Keflex.  I think the doxycycline here is appropriate.   I have marked the cellulitis area.  Careful follow-up is necessary.  I have spoken to the wound care team here.

## 2014-08-29 LAB — GLUCOSE, CAPILLARY
GLUCOSE-CAPILLARY: 139 mg/dL — AB (ref 70–99)
Glucose-Capillary: 119 mg/dL — ABNORMAL HIGH (ref 70–99)

## 2014-08-30 LAB — GLUCOSE, CAPILLARY
GLUCOSE-CAPILLARY: 118 mg/dL — AB (ref 70–99)
GLUCOSE-CAPILLARY: 188 mg/dL — AB (ref 70–99)

## 2014-08-31 ENCOUNTER — Non-Acute Institutional Stay (SKILLED_NURSING_FACILITY): Payer: Medicare Other | Admitting: Internal Medicine

## 2014-08-31 DIAGNOSIS — L03116 Cellulitis of left lower limb: Secondary | ICD-10-CM | POA: Diagnosis not present

## 2014-08-31 LAB — GLUCOSE, CAPILLARY
GLUCOSE-CAPILLARY: 123 mg/dL — AB (ref 70–99)
Glucose-Capillary: 168 mg/dL — ABNORMAL HIGH (ref 70–99)

## 2014-09-01 LAB — GLUCOSE, CAPILLARY
GLUCOSE-CAPILLARY: 195 mg/dL — AB (ref 70–99)
Glucose-Capillary: 129 mg/dL — ABNORMAL HIGH (ref 70–99)

## 2014-09-02 LAB — GLUCOSE, CAPILLARY
Glucose-Capillary: 127 mg/dL — ABNORMAL HIGH (ref 70–99)
Glucose-Capillary: 171 mg/dL — ABNORMAL HIGH (ref 70–99)

## 2014-09-03 LAB — GLUCOSE, CAPILLARY
GLUCOSE-CAPILLARY: 99 mg/dL (ref 70–99)
Glucose-Capillary: 151 mg/dL — ABNORMAL HIGH (ref 70–99)

## 2014-09-04 DIAGNOSIS — E1151 Type 2 diabetes mellitus with diabetic peripheral angiopathy without gangrene: Secondary | ICD-10-CM | POA: Insufficient documentation

## 2014-09-04 LAB — GLUCOSE, CAPILLARY
Glucose-Capillary: 165 mg/dL — ABNORMAL HIGH (ref 70–99)
Glucose-Capillary: 140 mg/dL — ABNORMAL HIGH (ref 70–99)

## 2014-09-04 NOTE — Progress Notes (Signed)
Patient ID: Tanner Pena, male   DOB: 12/17/1940, 74 y.o.   MRN: [id number]               PROGRESS NOTE  DATE:  08/31/2014                  FACILITY: [facility]        LEVEL OF CARE:   SNF   Acute Visit   CHIEF COMPLAINT:  Follow up left stump.    HISTORY OF PRESENT ILLNESS:  This is a patient who came to [place] with a left transtibial amputation for osteomyelitis and a nonhealing wound in his foot.    When he arrived here, he had some erythema at the medial aspect of his leg just to the medial aspect of his knee.  He was on Keflex and doxycycline.  I am seeing him predominantly for this.    PHYSICAL EXAMINATION:   SKIN:  INSPECTION:  Left stump:  The erythema that was worrying me previously has completely resolved.  However, he has darkening of the distal stump.  There is darkened eschar on the surgical line.  I am not exactly sure that this is going to hold together.  The sutures are still in place.  The patient does not describe constant pain.  In fact, he says it is better when he puts his stump up, which would be odd for continued peripheral vascular symptoms.    ASSESSMENT/PLAN:                                      Cellulitis of the stump.  This is a lot better.  The surgical incision line does not look healthy to me.  He has a follow-up with Orthopedic Surgery on Friday.  We will see how this progresses.  I did not add anything.  As mentioned, the area that I was originally concerned about is a lot better/resolved.

## 2014-09-05 LAB — GLUCOSE, CAPILLARY
Glucose-Capillary: 100 mg/dL — ABNORMAL HIGH (ref 70–99)
Glucose-Capillary: 157 mg/dL — ABNORMAL HIGH (ref 70–99)

## 2014-09-06 ENCOUNTER — Non-Acute Institutional Stay (SKILLED_NURSING_FACILITY): Payer: Medicare Other | Admitting: Internal Medicine

## 2014-09-06 ENCOUNTER — Encounter: Payer: Self-pay | Admitting: Internal Medicine

## 2014-09-06 DIAGNOSIS — E119 Type 2 diabetes mellitus without complications: Secondary | ICD-10-CM | POA: Diagnosis not present

## 2014-09-06 DIAGNOSIS — Z954 Presence of other heart-valve replacement: Secondary | ICD-10-CM

## 2014-09-06 DIAGNOSIS — Z952 Presence of prosthetic heart valve: Secondary | ICD-10-CM

## 2014-09-06 DIAGNOSIS — I10 Essential (primary) hypertension: Secondary | ICD-10-CM

## 2014-09-06 DIAGNOSIS — I255 Ischemic cardiomyopathy: Secondary | ICD-10-CM

## 2014-09-06 DIAGNOSIS — Z89512 Acquired absence of left leg below knee: Secondary | ICD-10-CM

## 2014-09-06 LAB — GLUCOSE, CAPILLARY: Glucose-Capillary: 100 mg/dL — ABNORMAL HIGH (ref 70–99)

## 2014-09-07 LAB — GLUCOSE, CAPILLARY
GLUCOSE-CAPILLARY: 113 mg/dL — AB (ref 70–99)
GLUCOSE-CAPILLARY: 90 mg/dL (ref 70–99)

## 2014-09-08 DIAGNOSIS — Z4781 Encounter for orthopedic aftercare following surgical amputation: Secondary | ICD-10-CM | POA: Diagnosis not present

## 2014-09-08 DIAGNOSIS — J449 Chronic obstructive pulmonary disease, unspecified: Secondary | ICD-10-CM | POA: Diagnosis not present

## 2014-09-08 DIAGNOSIS — E119 Type 2 diabetes mellitus without complications: Secondary | ICD-10-CM | POA: Diagnosis not present

## 2014-09-08 DIAGNOSIS — I2510 Atherosclerotic heart disease of native coronary artery without angina pectoris: Secondary | ICD-10-CM | POA: Diagnosis not present

## 2014-09-08 DIAGNOSIS — I10 Essential (primary) hypertension: Secondary | ICD-10-CM | POA: Diagnosis not present

## 2014-09-08 DIAGNOSIS — Z89512 Acquired absence of left leg below knee: Secondary | ICD-10-CM | POA: Diagnosis not present

## 2014-09-08 DIAGNOSIS — I739 Peripheral vascular disease, unspecified: Secondary | ICD-10-CM | POA: Diagnosis not present

## 2014-09-09 NOTE — Progress Notes (Signed)
Patient ID: Tanner Pena, male   DOB: 10/30/1940, 74 y.o.   MRN: [id number] FACILITY: [facility]:   SNF  Date is 09/06/2014   CHIEF COMPLAINT: Discharge note    HISTORY OF PRESENT ILLNESS:  This is a patient with multiple medical problems who was admitted for osteomyelitis and ulceration of his left lower extremity.  He underwent a left below-knee amputation.    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  He was referred to a vascular surgeon in [place].  He had undergone a transmetatarsal amputation by Dr. Sharol Given on 07/13/2014 and then, apparently, there was failure here and he was readmitted for definitive surgery.  His stay here has been relatively unremarkable-his stump had some concerns for healing originally but apparently this is stabilized he is followed by Dr. Sharol Given as well as wound care in the facility  His vital signs are stable-she will be with family including a daughter and a friend-he will need continued PT and OT for rehabilitation as well as nursing support for his medical issues-also will need a transfer tub bench    PAST MEDICAL HISTORY/PROBLEM LIST:                Coronary artery disease.  Status post bypass grafting in the 1990s with a St. Jude artificial valve at that time.  He has been on chronic Coumadin.   He had a re-do in 1994.    Severe PAD with stenting of his SFAs bilaterally by Dr. [name], as well as right external iliac.    Hypertension.    Hyperlipidemia.    Type 2 diabetes.    Obstructive sleep apnea.    Carotid artery disease.    Nonhealing ulcer on his left great toe.  Now status post left BKA.    LABS/RADIOLOGY:  Last echocardiogram on 05/08/2014 showed an EF of 40-45%, with a mechanical aortic valve prosthesis. 09/05/2014.  INR 2.78.   08/20/2014.   sodium 140 potassium 3.7 BUN 22 creatinine 0.99.  WBC 7.4 hemoglobin  12.1 platelets 157.  07/13/2014.  Alk phosphatase 25 otherwise liver function tests within normal limits    CURRENT MEDICATIONS:  Discharge medications include:       Combivent 2 puffs four times a day.    Allopurinol 300 daily.    Enteric-coated aspirin 325 q.d.    Keflex 500 by mouth twice daily.    Plavix 75 q.d.    Doxycycline 100 mg two times daily.    Tricor 145 daily.    Lasix 40 mg daily.    Glimepiride 1 mg tablet two times a day.    Imdur 30 q.d.    Metoprolol succinate 50 mg once a day.    Nicotine patch 14 mg daily.    Nitroglycerin p.r.n.    Oxycodone 20 mg q.6 p.r.n.     K-dur 20 mEq daily.    Lyrica 50 mg b.i.d.    Altace 5 mg in the morning, 2-1/2 at bedtime.    Crestor 20 q.d.    Januvia 50 q.d.    Coumadin 5 mg.  QD.    SOCIAL HISTORY:            HOUSING:  The patient lives in his own home.  It is apparently wheelchair-accessible.--He will be with a daughter and a friend   TOBACCO USE:  He quit smoking in  September 2015.    REVIEW OF SYSTEMS:  In general no complaints of fever or chills says he feels well.  Skin-again wound care has evaluated stump site apparently this is improving he is also followed by orthopedics.  Head ears eyes nose mouth and throat does not complain of any visual changes or sore throat. He     CHEST/RESPIRATORY:  No shortness of breath.  CARDIAC:   No chest pain.    GI:  No nausea or vomiting.  He has an umbilical hernia, which he has had for years.   MUSCULOSKELETAL:  Extremities: Pain in left stump appears to be controlled on oxycodone  Neurologic does not complain of numbness dizziness or headache currently.  [name]    PHYSICAL EXAMINATION Temperature 97.9 pulse 70 respirations 20 blood pressure 127/56:   GENERAL APPEARANCE:  Pleasant man in no distress.  Alert and attentive. His skin is warm and dry-wound care has wrapped surgical site-per wound care this is stable but will need close  follow-up Oropharynx is clear mucous membranes moist.  Eyes pupils reactive sclera and conjunctiva clear visual acuity appears intact   CHEST/RESPIRATORY:  Clear air entry bilaterally.    CARDIOVASCULAR:  CARDIAC:  Heart sounds are irregular.  He has a mechanical second sound.  There are no signs of heart failure.   EDEMA/VARICOSITIES:  Scant coccyx edema noted.   GASTROINTESTINAL:  ABDOMEN:   Mildly distended soft nontender with positive bowel sounds.   L.   HERNIA:  He has a periumbilical hernia that reduces easily.       MUSCULOSKELETAL:   EXTREMITIES:   RIGHT LOWER EXTREMITY:  In his remaining right leg, there is a good distal pulse at the dorsalis pedis. [name]   LEFT LOWER EXTREMITY: Stump area is dressed this is followed by wound care as well as Dr. Sharol Given-  .    ASSESSMENT/PLAN:                     Diabetic peripheral vascular disease.  Last hemoglobin A1c I see was 6.8 on 05/09/2014.  This would indicate adequate control.--Blood sugars in the morning appear to be in the lower 100s more in the mid 100s later in the day-she is on glipizide as well as Januvia    Status post left BKA. --I do note he continues on doxycycline-per wound care this is stable but will have to be watched closely as an outpatient he is followed by Dr. Sharol Given                                                                        History of coronary artery disease and aortic valve repair.  His INR yesterday was 2.78-he does have another PT/INR scheduled on 09/12/2014 this will have to be done by home health and primary care provider notified for follow-up--goal INR is 2. 5-3    History CHF-his weight has been stable we'll check a metabolic panel he is on Lasix as well as potassium.    History of neuropathy-he is on Lyrica this appears to be stable.    History of hypertension he is on all taste twice a day this appears to be stable recent blood pressures 127/56-135/46-124/49  History of hyperlipidemia-he  is  on Zocor as well as TriCor-since his stay here was quite short Will warrant follow up by primary care provider  Again patient will be going home with family and will need PT and OT for continued rehabilitation status post amputation-as well as nursing support for his multiple medical issues including follow-up of INR and diabetes  [id number]-of note greater than 30 minutes spent on this discharge summary

## 2014-09-12 ENCOUNTER — Ambulatory Visit (INDEPENDENT_AMBULATORY_CARE_PROVIDER_SITE_OTHER): Payer: Self-pay | Admitting: Pharmacist Clinician (PhC)/ Clinical Pharmacy Specialist

## 2014-09-12 DIAGNOSIS — I10 Essential (primary) hypertension: Secondary | ICD-10-CM | POA: Diagnosis not present

## 2014-09-12 DIAGNOSIS — Z4781 Encounter for orthopedic aftercare following surgical amputation: Secondary | ICD-10-CM | POA: Diagnosis not present

## 2014-09-12 DIAGNOSIS — I2510 Atherosclerotic heart disease of native coronary artery without angina pectoris: Secondary | ICD-10-CM | POA: Diagnosis not present

## 2014-09-12 DIAGNOSIS — Z89512 Acquired absence of left leg below knee: Secondary | ICD-10-CM | POA: Diagnosis not present

## 2014-09-12 DIAGNOSIS — I739 Peripheral vascular disease, unspecified: Secondary | ICD-10-CM | POA: Diagnosis not present

## 2014-09-12 DIAGNOSIS — Z952 Presence of prosthetic heart valve: Secondary | ICD-10-CM

## 2014-09-12 DIAGNOSIS — J449 Chronic obstructive pulmonary disease, unspecified: Secondary | ICD-10-CM | POA: Diagnosis not present

## 2014-09-12 DIAGNOSIS — E119 Type 2 diabetes mellitus without complications: Secondary | ICD-10-CM | POA: Diagnosis not present

## 2014-09-12 DIAGNOSIS — Z954 Presence of other heart-valve replacement: Secondary | ICD-10-CM

## 2014-09-12 DIAGNOSIS — Z7901 Long term (current) use of anticoagulants: Secondary | ICD-10-CM

## 2014-09-12 LAB — POCT INR: INR: 5

## 2014-09-13 DIAGNOSIS — I25810 Atherosclerosis of coronary artery bypass graft(s) without angina pectoris: Secondary | ICD-10-CM | POA: Diagnosis not present

## 2014-09-15 DIAGNOSIS — Z4781 Encounter for orthopedic aftercare following surgical amputation: Secondary | ICD-10-CM | POA: Diagnosis not present

## 2014-09-16 DIAGNOSIS — E119 Type 2 diabetes mellitus without complications: Secondary | ICD-10-CM | POA: Diagnosis not present

## 2014-09-16 DIAGNOSIS — I739 Peripheral vascular disease, unspecified: Secondary | ICD-10-CM | POA: Diagnosis not present

## 2014-09-16 DIAGNOSIS — I10 Essential (primary) hypertension: Secondary | ICD-10-CM | POA: Diagnosis not present

## 2014-09-16 DIAGNOSIS — Z89512 Acquired absence of left leg below knee: Secondary | ICD-10-CM | POA: Diagnosis not present

## 2014-09-16 DIAGNOSIS — Z4781 Encounter for orthopedic aftercare following surgical amputation: Secondary | ICD-10-CM | POA: Diagnosis not present

## 2014-09-16 DIAGNOSIS — I2510 Atherosclerotic heart disease of native coronary artery without angina pectoris: Secondary | ICD-10-CM | POA: Diagnosis not present

## 2014-09-16 DIAGNOSIS — J449 Chronic obstructive pulmonary disease, unspecified: Secondary | ICD-10-CM | POA: Diagnosis not present

## 2014-09-19 ENCOUNTER — Ambulatory Visit (INDEPENDENT_AMBULATORY_CARE_PROVIDER_SITE_OTHER): Payer: Medicare Other | Admitting: Pharmacist Clinician (PhC)/ Clinical Pharmacy Specialist

## 2014-09-19 DIAGNOSIS — Z952 Presence of prosthetic heart valve: Secondary | ICD-10-CM

## 2014-09-19 DIAGNOSIS — Z954 Presence of other heart-valve replacement: Secondary | ICD-10-CM | POA: Diagnosis not present

## 2014-09-19 DIAGNOSIS — J449 Chronic obstructive pulmonary disease, unspecified: Secondary | ICD-10-CM | POA: Diagnosis not present

## 2014-09-19 DIAGNOSIS — Z7901 Long term (current) use of anticoagulants: Secondary | ICD-10-CM

## 2014-09-19 DIAGNOSIS — Z4781 Encounter for orthopedic aftercare following surgical amputation: Secondary | ICD-10-CM | POA: Diagnosis not present

## 2014-09-19 DIAGNOSIS — E119 Type 2 diabetes mellitus without complications: Secondary | ICD-10-CM | POA: Diagnosis not present

## 2014-09-19 DIAGNOSIS — I10 Essential (primary) hypertension: Secondary | ICD-10-CM | POA: Diagnosis not present

## 2014-09-19 DIAGNOSIS — I739 Peripheral vascular disease, unspecified: Secondary | ICD-10-CM | POA: Diagnosis not present

## 2014-09-19 DIAGNOSIS — I2510 Atherosclerotic heart disease of native coronary artery without angina pectoris: Secondary | ICD-10-CM | POA: Diagnosis not present

## 2014-09-19 DIAGNOSIS — I25810 Atherosclerosis of coronary artery bypass graft(s) without angina pectoris: Secondary | ICD-10-CM | POA: Diagnosis not present

## 2014-09-19 DIAGNOSIS — Z89512 Acquired absence of left leg below knee: Secondary | ICD-10-CM | POA: Diagnosis not present

## 2014-09-19 LAB — POCT INR: INR: 3.2

## 2014-09-20 ENCOUNTER — Other Ambulatory Visit (HOSPITAL_COMMUNITY): Payer: Self-pay | Admitting: Orthopedic Surgery

## 2014-09-22 ENCOUNTER — Encounter (HOSPITAL_COMMUNITY): Payer: Self-pay | Admitting: *Deleted

## 2014-09-22 DIAGNOSIS — I10 Essential (primary) hypertension: Secondary | ICD-10-CM | POA: Diagnosis not present

## 2014-09-22 DIAGNOSIS — I2510 Atherosclerotic heart disease of native coronary artery without angina pectoris: Secondary | ICD-10-CM | POA: Diagnosis not present

## 2014-09-22 DIAGNOSIS — Z89512 Acquired absence of left leg below knee: Secondary | ICD-10-CM | POA: Diagnosis not present

## 2014-09-22 DIAGNOSIS — I739 Peripheral vascular disease, unspecified: Secondary | ICD-10-CM | POA: Diagnosis not present

## 2014-09-22 DIAGNOSIS — E119 Type 2 diabetes mellitus without complications: Secondary | ICD-10-CM | POA: Diagnosis not present

## 2014-09-22 DIAGNOSIS — Z4781 Encounter for orthopedic aftercare following surgical amputation: Secondary | ICD-10-CM | POA: Diagnosis not present

## 2014-09-22 DIAGNOSIS — J449 Chronic obstructive pulmonary disease, unspecified: Secondary | ICD-10-CM | POA: Diagnosis not present

## 2014-09-22 MED ORDER — CEFAZOLIN SODIUM-DEXTROSE 2-3 GM-% IV SOLR
2.0000 g | INTRAVENOUS | Status: AC
  Administered 2014-09-23: 2 g via INTRAVENOUS
  Filled 2014-09-22: qty 50

## 2014-09-23 ENCOUNTER — Ambulatory Visit (HOSPITAL_COMMUNITY): Payer: Medicare Other | Admitting: Anesthesiology

## 2014-09-23 ENCOUNTER — Encounter (HOSPITAL_COMMUNITY): Payer: Self-pay | Admitting: Certified Registered"

## 2014-09-23 ENCOUNTER — Ambulatory Visit (HOSPITAL_COMMUNITY)
Admission: RE | Admit: 2014-09-23 | Discharge: 2014-09-23 | Disposition: A | Discharge status: Home / Self Care | Payer: Medicare Other | Source: Ambulatory Visit | Attending: Orthopedic Surgery | Admitting: Orthopedic Surgery

## 2014-09-23 ENCOUNTER — Encounter (HOSPITAL_COMMUNITY)
Admission: RE | Disposition: A | Discharge status: Home / Self Care | Payer: Self-pay | Source: Ambulatory Visit | Attending: Orthopedic Surgery

## 2014-09-23 DIAGNOSIS — G549 Nerve root and plexus disorder, unspecified: Secondary | ICD-10-CM | POA: Insufficient documentation

## 2014-09-23 DIAGNOSIS — G4730 Sleep apnea, unspecified: Secondary | ICD-10-CM | POA: Insufficient documentation

## 2014-09-23 DIAGNOSIS — I739 Peripheral vascular disease, unspecified: Secondary | ICD-10-CM | POA: Diagnosis not present

## 2014-09-23 DIAGNOSIS — Z952 Presence of prosthetic heart valve: Secondary | ICD-10-CM | POA: Insufficient documentation

## 2014-09-23 DIAGNOSIS — G8929 Other chronic pain: Secondary | ICD-10-CM | POA: Insufficient documentation

## 2014-09-23 DIAGNOSIS — Z951 Presence of aortocoronary bypass graft: Secondary | ICD-10-CM | POA: Insufficient documentation

## 2014-09-23 DIAGNOSIS — E780 Pure hypercholesterolemia: Secondary | ICD-10-CM | POA: Diagnosis not present

## 2014-09-23 DIAGNOSIS — I2510 Atherosclerotic heart disease of native coronary artery without angina pectoris: Secondary | ICD-10-CM | POA: Diagnosis not present

## 2014-09-23 DIAGNOSIS — Z87891 Personal history of nicotine dependence: Secondary | ICD-10-CM | POA: Diagnosis not present

## 2014-09-23 DIAGNOSIS — E119 Type 2 diabetes mellitus without complications: Secondary | ICD-10-CM | POA: Insufficient documentation

## 2014-09-23 DIAGNOSIS — Y835 Amputation of limb(s) as the cause of abnormal reaction of the patient, or of later complication, without mention of misadventure at the time of the procedure: Secondary | ICD-10-CM | POA: Diagnosis not present

## 2014-09-23 DIAGNOSIS — M1990 Unspecified osteoarthritis, unspecified site: Secondary | ICD-10-CM | POA: Insufficient documentation

## 2014-09-23 DIAGNOSIS — I509 Heart failure, unspecified: Secondary | ICD-10-CM | POA: Insufficient documentation

## 2014-09-23 DIAGNOSIS — K219 Gastro-esophageal reflux disease without esophagitis: Secondary | ICD-10-CM | POA: Diagnosis not present

## 2014-09-23 DIAGNOSIS — J449 Chronic obstructive pulmonary disease, unspecified: Secondary | ICD-10-CM | POA: Diagnosis not present

## 2014-09-23 DIAGNOSIS — T8752 Necrosis of amputation stump, left upper extremity: Secondary | ICD-10-CM | POA: Diagnosis not present

## 2014-09-23 DIAGNOSIS — T8781 Dehiscence of amputation stump: Secondary | ICD-10-CM | POA: Insufficient documentation

## 2014-09-23 DIAGNOSIS — I252 Old myocardial infarction: Secondary | ICD-10-CM | POA: Insufficient documentation

## 2014-09-23 DIAGNOSIS — I10 Essential (primary) hypertension: Secondary | ICD-10-CM | POA: Insufficient documentation

## 2014-09-23 DIAGNOSIS — M109 Gout, unspecified: Secondary | ICD-10-CM | POA: Diagnosis not present

## 2014-09-23 DIAGNOSIS — Z955 Presence of coronary angioplasty implant and graft: Secondary | ICD-10-CM | POA: Insufficient documentation

## 2014-09-23 HISTORY — PX: STUMP REVISION: SHX6102

## 2014-09-23 LAB — COMPREHENSIVE METABOLIC PANEL
ALT: 12 U/L (ref 0–53)
AST: 22 U/L (ref 0–37)
Albumin: 3.3 g/dL — ABNORMAL LOW (ref 3.5–5.2)
Alkaline Phosphatase: 26 U/L — ABNORMAL LOW (ref 39–117)
Anion gap: 5 (ref 5–15)
BUN: 28 mg/dL — ABNORMAL HIGH (ref 6–23)
CO2: 22 mmol/L (ref 19–32)
Calcium: 9.4 mg/dL (ref 8.4–10.5)
Chloride: 111 mmol/L (ref 96–112)
Creatinine, Ser: 1.24 mg/dL (ref 0.50–1.35)
GFR calc Af Amer: 65 mL/min — ABNORMAL LOW (ref >90)
GFR calc non Af Amer: 56 mL/min — ABNORMAL LOW (ref >90)
Glucose, Bld: 122 mg/dL — ABNORMAL HIGH (ref 70–99)
Potassium: 4.4 mmol/L (ref 3.5–5.1)
Sodium: 138 mmol/L (ref 135–145)
Total Bilirubin: 0.6 mg/dL (ref 0.3–1.2)
Total Protein: 5.7 g/dL — ABNORMAL LOW (ref 6.0–8.3)

## 2014-09-23 LAB — PROTIME-INR
INR: 4.38 — ABNORMAL HIGH (ref 0.00–1.49)
Prothrombin Time: 42.2 seconds — ABNORMAL HIGH (ref 11.6–15.2)

## 2014-09-23 LAB — CBC
HCT: 29.4 % — ABNORMAL LOW (ref 39.0–52.0)
HEMOGLOBIN: 9.5 g/dL — AB (ref 13.0–17.0)
MCH: 29 pg (ref 26.0–34.0)
MCHC: 32.3 g/dL (ref 30.0–36.0)
MCV: 89.6 fL (ref 78.0–100.0)
PLATELETS: 280 10*3/uL (ref 150–400)
RBC: 3.28 MIL/uL — ABNORMAL LOW (ref 4.22–5.81)
RDW: 18.2 % — AB (ref 11.5–15.5)
WBC: 7.4 10*3/uL (ref 4.0–10.5)

## 2014-09-23 LAB — GLUCOSE, CAPILLARY
Glucose-Capillary: 111 mg/dL — ABNORMAL HIGH (ref 70–99)
Glucose-Capillary: 93 mg/dL (ref 70–99)

## 2014-09-23 LAB — APTT: aPTT: 53 seconds — ABNORMAL HIGH (ref 24–37)

## 2014-09-23 SURGERY — REVISION, AMPUTATION SITE
Anesthesia: General | Laterality: Left

## 2014-09-23 MED ORDER — FENTANYL CITRATE 0.05 MG/ML IJ SOLN
INTRAMUSCULAR | Status: AC
  Filled 2014-09-23: qty 2

## 2014-09-23 MED ORDER — FENTANYL CITRATE 0.05 MG/ML IJ SOLN
INTRAMUSCULAR | Status: DC | PRN
  Administered 2014-09-23 (×2): 50 ug via INTRAVENOUS

## 2014-09-23 MED ORDER — HYDROMORPHONE HCL 1 MG/ML IJ SOLN
INTRAMUSCULAR | Status: AC
  Filled 2014-09-23: qty 1

## 2014-09-23 MED ORDER — PROPOFOL 10 MG/ML IV BOLUS
INTRAVENOUS | Status: AC
  Filled 2014-09-23: qty 20

## 2014-09-23 MED ORDER — LIDOCAINE HCL (CARDIAC) 20 MG/ML IV SOLN
INTRAVENOUS | Status: DC | PRN
  Administered 2014-09-23: 60 mg via INTRAVENOUS

## 2014-09-23 MED ORDER — PROPOFOL 10 MG/ML IV BOLUS
INTRAVENOUS | Status: DC | PRN
  Administered 2014-09-23: 80 mg via INTRAVENOUS
  Administered 2014-09-23: 20 mg via INTRAVENOUS

## 2014-09-23 MED ORDER — ACETAMINOPHEN 325 MG PO TABS
ORAL_TABLET | ORAL | Status: AC
  Administered 2014-09-23: 325 mg
  Filled 2014-09-23: qty 1

## 2014-09-23 MED ORDER — FENTANYL CITRATE 0.05 MG/ML IJ SOLN
25.0000 ug | INTRAMUSCULAR | Status: DC | PRN
  Administered 2014-09-23 (×3): 50 ug via INTRAVENOUS

## 2014-09-23 MED ORDER — ONDANSETRON HCL 4 MG/2ML IJ SOLN
INTRAMUSCULAR | Status: DC | PRN
  Administered 2014-09-23: 4 mg via INTRAVENOUS

## 2014-09-23 MED ORDER — OXYCODONE HCL 5 MG/5ML PO SOLN: 5.0000 mg | Freq: Once | ORAL | Status: AC | PRN

## 2014-09-23 MED ORDER — HYDROMORPHONE HCL 1 MG/ML IJ SOLN: 0.5000 mg | INTRAMUSCULAR | Status: DC | PRN

## 2014-09-23 MED ORDER — EPHEDRINE SULFATE 50 MG/ML IJ SOLN
INTRAMUSCULAR | Status: DC | PRN
  Administered 2014-09-23 (×2): 10 mg via INTRAVENOUS

## 2014-09-23 MED ORDER — FENTANYL CITRATE 0.05 MG/ML IJ SOLN
INTRAMUSCULAR | Status: AC
  Filled 2014-09-23: qty 5

## 2014-09-23 MED ORDER — PHENYLEPHRINE HCL 10 MG/ML IJ SOLN
INTRAMUSCULAR | Status: DC | PRN
  Administered 2014-09-23 (×2): 80 ug via INTRAVENOUS

## 2014-09-23 MED ORDER — ONDANSETRON HCL 4 MG/2ML IJ SOLN: 4.0000 mg | Freq: Four times a day (QID) | INTRAMUSCULAR | Status: DC | PRN

## 2014-09-23 MED ORDER — LACTATED RINGERS IV SOLN
INTRAVENOUS | Status: DC
  Administered 2014-09-23: 14:00:00 via INTRAVENOUS

## 2014-09-23 MED ORDER — OXYCODONE-ACETAMINOPHEN 10-325 MG PO TABS: 1.0000 | ORAL_TABLET | ORAL | Status: AC | PRN

## 2014-09-23 MED ORDER — OXYCODONE-ACETAMINOPHEN 5-325 MG PO TABS: 1.0000 | ORAL_TABLET | Freq: Once | ORAL | Status: DC

## 2014-09-23 MED ORDER — OXYCODONE HCL 5 MG PO TABS
ORAL_TABLET | ORAL | Status: AC
  Administered 2014-09-23: 5 mg
  Filled 2014-09-23: qty 3

## 2014-09-23 MED ORDER — OXYCODONE HCL 5 MG PO TABS
5.0000 mg | ORAL_TABLET | Freq: Once | ORAL | Status: AC | PRN
  Administered 2014-09-23: 5 mg via ORAL

## 2014-09-23 SURGICAL SUPPLY — 54 items
BANDAGE ESMARK 6X9 LF (GAUZE/BANDAGES/DRESSINGS) ×1 IMPLANT
BLADE SAW RECIP 87.9 MT (BLADE) ×2 IMPLANT
BNDG CMPR 9X6 STRL LF SNTH (GAUZE/BANDAGES/DRESSINGS) ×1
BNDG COHESIVE 6X5 TAN STRL LF (GAUZE/BANDAGES/DRESSINGS) ×4 IMPLANT
BNDG ESMARK 6X9 LF (GAUZE/BANDAGES/DRESSINGS) ×3
BNDG GAUZE STRTCH 6 (GAUZE/BANDAGES/DRESSINGS) IMPLANT
COVER SURGICAL LIGHT HANDLE (MISCELLANEOUS) ×3 IMPLANT
CUFF TOURNIQUET SINGLE 34IN LL (TOURNIQUET CUFF) IMPLANT
DRAIN PENROSE 1/2X12 LTX STRL (WOUND CARE) IMPLANT
DRAPE EXTREMITY T 121X128X90 (DRAPE) ×3 IMPLANT
DRAPE PROXIMA HALF (DRAPES) ×6 IMPLANT
DRAPE U-SHAPE 47X51 STRL (DRAPES) ×6 IMPLANT
DRSG ADAPTIC 3X8 NADH LF (GAUZE/BANDAGES/DRESSINGS) ×3 IMPLANT
DRSG PAD ABDOMINAL 8X10 ST (GAUZE/BANDAGES/DRESSINGS) ×6 IMPLANT
DURAPREP 26ML APPLICATOR (WOUND CARE) ×3 IMPLANT
ELECT CAUTERY BLADE 6.4 (BLADE) IMPLANT
ELECT REM PT RETURN 9FT ADLT (ELECTROSURGICAL) ×3
ELECTRODE REM PT RTRN 9FT ADLT (ELECTROSURGICAL) ×1 IMPLANT
EVACUATOR 1/8 PVC DRAIN (DRAIN) IMPLANT
GAUZE SPONGE 4X4 12PLY STRL (GAUZE/BANDAGES/DRESSINGS) ×3 IMPLANT
GLOVE BIOGEL M 7.0 STRL (GLOVE) ×2 IMPLANT
GLOVE BIOGEL PI IND STRL 6.5 (GLOVE) IMPLANT
GLOVE BIOGEL PI IND STRL 7.0 (GLOVE) IMPLANT
GLOVE BIOGEL PI IND STRL 9 (GLOVE) ×1 IMPLANT
GLOVE BIOGEL PI INDICATOR 6.5 (GLOVE) ×2
GLOVE BIOGEL PI INDICATOR 7.0 (GLOVE) ×2
GLOVE BIOGEL PI INDICATOR 9 (GLOVE) ×2
GLOVE EUDERMIC 6.5 POWDERFREE (GLOVE) ×2 IMPLANT
GLOVE SURG ORTHO 9.0 STRL STRW (GLOVE) ×5 IMPLANT
GOWN STRL REUS W/ TWL XL LVL3 (GOWN DISPOSABLE) ×2 IMPLANT
GOWN STRL REUS W/TWL XL LVL3 (GOWN DISPOSABLE) ×6
KIT BASIN OR (CUSTOM PROCEDURE TRAY) ×3 IMPLANT
KIT ROOM TURNOVER OR (KITS) ×3 IMPLANT
MANIFOLD NEPTUNE II (INSTRUMENTS) ×3 IMPLANT
NS IRRIG 1000ML POUR BTL (IV SOLUTION) ×3 IMPLANT
PACK GENERAL/GYN (CUSTOM PROCEDURE TRAY) ×3 IMPLANT
PAD ARMBOARD 7.5X6 YLW CONV (MISCELLANEOUS) ×6 IMPLANT
PAD CAST 4YDX4 CTTN HI CHSV (CAST SUPPLIES) ×1 IMPLANT
PADDING CAST COTTON 4X4 STRL (CAST SUPPLIES) ×3
PADDING CAST COTTON 6X4 STRL (CAST SUPPLIES) ×3 IMPLANT
SPONGE LAP 18X18 X RAY DECT (DISPOSABLE) ×2 IMPLANT
STAPLER VISISTAT 35W (STAPLE) ×2 IMPLANT
STOCKINETTE IMPERVIOUS LG (DRAPES) IMPLANT
SUT ETHILON 2 0 PSLX (SUTURE) ×8 IMPLANT
SUT PDS AB 1 CT  36 (SUTURE)
SUT PDS AB 1 CT 36 (SUTURE) IMPLANT
SUT SILK 2 0 (SUTURE) ×3
SUT SILK 2-0 18XBRD TIE 12 (SUTURE) ×1 IMPLANT
SUT VIC AB 1 CTX 36 (SUTURE)
SUT VIC AB 1 CTX36XBRD ANBCTR (SUTURE) IMPLANT
TOWEL OR 17X24 6PK STRL BLUE (TOWEL DISPOSABLE) ×3 IMPLANT
TOWEL OR 17X26 10 PK STRL BLUE (TOWEL DISPOSABLE) ×3 IMPLANT
TUBE ANAEROBIC SPECIMEN COL (MISCELLANEOUS) IMPLANT
WATER STERILE IRR 1000ML POUR (IV SOLUTION) ×1 IMPLANT

## 2014-09-23 NOTE — Op Note (Signed)
09/23/2014  6:00 PM  PATIENT:  Tanner Pena    PRE-OPERATIVE DIAGNOSIS:  1`Dehiscence Left Below Knee Amputation  POST-OPERATIVE DIAGNOSIS:  Same  PROCEDURE:  Revision Left Below Knee Amputation  SURGEON:  [name], MD  PHYSICIAN ASSISTANT:None ANESTHESIA:   General  PREOPERATIVE INDICATIONS:  Tanner Pena is a  74 y.o. male with a diagnosis of 1`Dehiscence Left Below Knee Amputation who failed conservative measures and elected for surgical management.    The risks benefits and alternatives were discussed with the patient preoperatively including but not limited to the risks of infection, bleeding, nerve injury, cardiopulmonary complications, the need for revision surgery, among others, and the patient was willing to proceed.  OPERATIVE IMPLANTS: None  OPERATIVE FINDINGS: Minimal petechial bleeding  OPERATIVE PROCEDURE: Patient was brought to the operating room and underwent a general anesthetic. After adequate levels of anesthesia were obtained patient's left lower extremity was prepped using DuraPrep draped into a sterile field. A timeout was called. A fishmouth incision was made around the gangrenous necrotic edge of his transtibial dictation. This was carried down to the tibia and fibula. Approximately 2 cm of the tibia and fibula were resected. More soft tissue was resected to resect back to healthy viable bleeding tissue. The wound was irrigated with normal saline. Hemostasis was obtained. The skin was closed using 2-0 nylon and staples. A sterile compressive dressing was applied. Patient was extubated taken to the PACU in stable condition. Plan for discharge to home.

## 2014-09-23 NOTE — Transfer of Care (Signed)
Immediate Anesthesia Transfer of Care Note  Patient: Tanner Pena  Procedure(s) Performed: Procedure(s): Revision Left Below Knee Amputation (Left)  Patient Location: PACU  Anesthesia Type:General  Level of Consciousness: awake, alert  and oriented  Airway & Oxygen Therapy: Patient Spontanous Breathing  Post-op Assessment: Report given to RN and Post -op Vital signs reviewed and stable  Post vital signs: Reviewed and stable  Last Vitals:  Filed Vitals:   09/23/14 1302  BP: 107/40  Pulse: 60  Temp: 36.7 C  Resp: 20    Complications: No apparent anesthesia complications

## 2014-09-23 NOTE — Discharge Instructions (Signed)
°What to eat: ° °For your first meals, you should eat lightly; only small meals initially.  If you do not have nausea, you may eat larger meals.  Avoid spicy, greasy and heavy food.   ° °General Anesthesia, Adult, Care After  °Refer to this sheet in the next few weeks. These instructions provide you with information on caring for yourself after your procedure. Your health care provider may also give you more specific instructions. Your treatment has been planned according to current medical practices, but problems sometimes occur. Call your health care provider if you have any problems or questions after your procedure.  °WHAT TO EXPECT AFTER THE PROCEDURE  °After the procedure, it is typical to experience:  °Sleepiness.  °Nausea and vomiting. °HOME CARE INSTRUCTIONS  °For the first 24 hours after general anesthesia:  °Have a responsible person with you.  °Do not drive a car. If you are alone, do not take public transportation.  °Do not drink alcohol.  °Do not take medicine that has not been prescribed by your health care provider.  °Do not sign important papers or make important decisions.  °You may resume a normal diet and activities as directed by your health care provider.  °Change bandages (dressings) as directed.  °If you have questions or problems that seem related to general anesthesia, call the hospital and ask for the anesthetist or anesthesiologist on call. °SEEK MEDICAL CARE IF:  °You have nausea and vomiting that continue the day after anesthesia.  °You develop a rash. °SEEK IMMEDIATE MEDICAL CARE IF:  °You have difficulty breathing.  °You have chest pain.  °You have any allergic problems. °Document Released: 11/11/2000 Document Revised: 04/07/2013 Document Reviewed: 02/18/2013  °ExitCare® Patient Information ©2014 [employer].  ° °Sore Throat  ° ° °A sore throat is a painful, burning, sore, or scratchy feeling of the throat. There may be pain or tenderness when swallowing or talking. You may have  other symptoms with a sore throat. These include coughing, sneezing, fever, or a swollen neck. A sore throat is often the first sign of another sickness. These sicknesses may include a cold, flu, strep throat, or an infection called mono. Most sore throats go away without medical treatment.  °HOME CARE  °Only take medicine as told by your doctor.  °Drink enough fluids to keep your pee (urine) clear or pale yellow.  °Rest as needed.  °Try using throat sprays, lozenges, or suck on hard candy (if older than 4 years or as told).  °Sip warm liquids, such as broth, herbal tea, or warm water with honey. Try sucking on frozen ice pops or drinking cold liquids.  °Rinse the mouth (gargle) with salt water. Mix 1 teaspoon salt with 8 ounces of water.  °Do not smoke. Avoid being around others when they are smoking.  °Put a humidifier in your bedroom at night to moisten the air. You can also turn on a hot shower and sit in the bathroom for 5-10 minutes. Be sure the bathroom door is closed. °GET HELP RIGHT AWAY IF:  °You have trouble breathing.  °You cannot swallow fluids, soft foods, or your spit (saliva).  °You have more puffiness (swelling) in the throat.  °Your sore throat does not get better in 7 days.  °You feel sick to your stomach (nauseous) and throw up (vomit).  °You have a fever or lasting symptoms for more than 2-3 days.  °You have a fever and your symptoms suddenly get worse. °MAKE SURE YOU:  °Understand these   instructions.  °Will watch your condition.  °Will get help right away if you are not doing well or get worse. °Document Released: 05/14/2008 Document Revised: 04/29/2012 Document Reviewed: 04/12/2012  °ExitCare® Patient Information ©2015 [employer]. This information is not intended to replace advice given to you by your health care provider. Make sure you discuss any questions you have with your health care provider.  ° ° ° °

## 2014-09-23 NOTE — Anesthesia Procedure Notes (Signed)
Procedure Name: LMA Insertion Date/Time: 09/23/2014 3:12 PM Performed by: Manuela Schwartz B Pre-anesthesia Checklist: Patient identified, Emergency Drugs available, Suction available, Patient being monitored and Timeout performed Patient Re-evaluated:Patient Re-evaluated prior to inductionOxygen Delivery Method: Circle system utilized Preoxygenation: Pre-oxygenation with 100% oxygen LMA: LMA inserted LMA Size: 5.0 Number of attempts: 1 Placement Confirmation: positive ETCO2 and breath sounds checked- equal and bilateral Tube secured with: Tape Dental Injury: Teeth and Oropharynx as per pre-operative assessment

## 2014-09-23 NOTE — Anesthesia Preprocedure Evaluation (Signed)
Anesthesia Evaluation  Patient identified by MRN, date of birth, ID band Patient awake    Reviewed: Allergy & Precautions, NPO status , Patient's Chart, lab work & pertinent test results  Airway Mallampati: II   Neck ROM: full    Dental   Pulmonary shortness of breath, sleep apnea , COPDformer smoker,          Cardiovascular hypertension, + angina + CAD, + Past MI, + Cardiac Stents, + CABG, + Peripheral Vascular Disease and +CHF + dysrhythmias + Valvular Problems/Murmurs  S/p AVR   Neuro/Psych  Neuromuscular disease    GI/Hepatic GERD-  ,  Endo/Other  diabetes, Type 2  Renal/GU      Musculoskeletal  (+) Arthritis -,   Abdominal   Peds  Hematology   Anesthesia Other Findings   Reproductive/Obstetrics                             Anesthesia Physical Anesthesia Plan  ASA: IV  Anesthesia Plan: General   Post-op Pain Management:    Induction: Intravenous  Airway Management Planned: LMA  Additional Equipment:   Intra-op Plan:   Post-operative Plan:   Informed Consent: I have reviewed the patients History and Physical, chart, labs and discussed the procedure including the risks, benefits and alternatives for the proposed anesthesia with the patient or authorized representative who has indicated his/her understanding and acceptance.     Plan Discussed with: CRNA, Anesthesiologist and Surgeon  Anesthesia Plan Comments:         Anesthesia Quick Evaluation

## 2014-09-23 NOTE — H&P (Signed)
Tanner Pena is an 74 y.o. male.   Chief Complaint: Dehiscence left transtibial amputation HPI: Patient is a 74 year old gentleman diabetic and some sciatic neuropathy peripheral vascular disease status post transtibial amputation with progressive gangrenous changes of the residual limb.  Past Medical History  Diagnosis Date  . Diabetes mellitus 2007  . Hypertension   . Gout   . Hypercholesteremia   . Peripheral vascular disease     stents lower extremity  . COPD (chronic obstructive pulmonary disease)   . S/P aortic valve replacement 1990    a. [employer]  . Chronic back pain   . Dysphagia   . Neuromuscular disorder   . Peripheral neuropathy   . Critical lower limb ischemia   . CAD (coronary artery disease)     a. 05/13/14 [name] s/p overlapping DESx2 to SVG to RCA. b.  s/p CABG '90 with redo '94 & stent to RCA SVG in 2005  . Chronic toe ulcer     a. left foot  . Shortness of breath   . Myocardial infarction   . CHF (congestive heart failure)   . Stone in kidney   . GERD (gastroesophageal reflux disease)   . Arthritis   . Sleep apnea     tested greater than 7 years ago per patient    Past Surgical History  Procedure Laterality Date  . Open heart surgery  1990    prosthetic heart valve, one bypass  . Rotator cuff repair      right  . Cataract extraction      bilateral  . Coronary stent placement  2005    RCA vein graft A 3.0x13.0 TAXUS stent was then placed int he vessel a Viva 3.0x4.0 (perfusion balloon was made ready it was placed through the entire lenght of the stent  . Peripheral vascular procedures lower extremities      right external iliac  artery PTA and stenting as well as bilateral SFA intervention remotely. Repeat procedures in 2011 bilaterally  . Coronary artery bypass graft  1994    6 vessels  . [name]  06/13/2011    Procedure: Venia Minks DILATION;  Surgeon: [name], MD;  Location: [facility];  Service: Endoscopy;  Laterality: N/A;  Dilated to  56.   . Angioplasty illiac artery    . Back surgery  [id number]    2  . Eye surgery    . Amputation Left 07/13/2014    Procedure: Transmetatarsal Amputation;  Surgeon: [name], MD;  Location: [place];  Service: Orthopedics;  Laterality: Left;  . Lower extremity angiogram N/A 02/15/2013    Procedure: LOWER EXTREMITY ANGIOGRAM;  Surgeon: [name], MD;  Location: [facility] C[facility] LAB;  Service: Cardiovascular;  Laterality: N/A;  . Left heart catheterization with coronary angiogram N/A 05/11/2014    Procedure: LEFT HEART CATHETERIZATION WITH CORONARY ANGIOGRAM;  Surgeon: [name], MD;  Location: [facility] CATH LAB;  Service: Cardiovascular;  Laterality: N/A;  . Percutaneous coronary stent intervention (pci-s) N/A 05/13/2014    Procedure: PERCUTANEOUS CORONARY STENT INTERVENTION (PCI-S);  Surgeon: [name], MD;  Location: [facility] CATH LAB;  Service: Cardiovascular;  Laterality: N/A;  . Lower extremity angiogram N/A 06/06/2014    Procedure: LOWER EXTREMITY ANGIOGRAM;  Surgeon: [name], MD;  Location: [facility] CATH LAB;  Service: Cardiovascular;  Laterality: N/A;  . Amputation Left 08/20/2014    Procedure: Revision Transmetatarsal Amputation versus Below Knee Amputation;  Surgeon: [name], MD;  Location: [place];  Service: Orthopedics;  Laterality: Left;    Family History  Problem Relation Age of Onset  . Colon cancer Neg Hx   . Liver disease Neg Hx    Social History:  reports that he quit smoking about 4 months ago. His smoking use included Cigarettes. He started smoking about 61 years ago. He has a 55 pack-year smoking history. His smokeless tobacco use includes Chew. He reports that he does not drink alcohol or use illicit drugs.  Allergies: No Known Allergies  No prescriptions prior to admission    No results found for this or any previous visit (from the past 48 hour(s)). No results found.  Review of Systems  All other systems reviewed and are negative.   There  were no vitals taken for this visit. Physical Exam  On examination patient has dehiscence and necrosis of his transtibial dictation on the left. Assessment/Plan Assessment: Left transtibial amputation dehiscence.  Plan: We'll plan for revision left transtibial dictation. Risk and benefits were discussed including potential for additional surgery. Patient states he understands and wishes to proceed at this time.  Tanner Pena V 09/23/2014, 6:35 AM

## 2014-09-25 NOTE — Anesthesia Postprocedure Evaluation (Signed)
  Anesthesia Post-op Note  Patient: Tanner Pena  Procedure(s) Performed: Procedure(s): Revision Left Below Knee Amputation (Left)  Patient Location: PACU  Anesthesia Type:General  Level of Consciousness: awake  Airway and Oxygen Therapy: Patient Spontanous Breathing  Post-op Pain: moderate  Post-op Assessment: Post-op Vital signs reviewed, Patient's Cardiovascular Status Stable, Respiratory Function Stable, Patent Airway, No signs of Nausea or vomiting and Pain level controlled  Post-op Vital Signs: Reviewed and stable  Last Vitals:  Filed Vitals:   09/23/14 1645  BP: 171/53  Pulse: 56  Temp: 36.3 C  Resp: 14    Complications: No apparent anesthesia complications

## 2014-09-26 ENCOUNTER — Encounter (HOSPITAL_COMMUNITY): Payer: Self-pay | Admitting: Orthopedic Surgery

## 2014-09-26 ENCOUNTER — Ambulatory Visit (INDEPENDENT_AMBULATORY_CARE_PROVIDER_SITE_OTHER): Payer: Self-pay | Admitting: Pharmacist Clinician (PhC)/ Clinical Pharmacy Specialist

## 2014-09-26 DIAGNOSIS — I2510 Atherosclerotic heart disease of native coronary artery without angina pectoris: Secondary | ICD-10-CM | POA: Diagnosis not present

## 2014-09-26 DIAGNOSIS — I10 Essential (primary) hypertension: Secondary | ICD-10-CM | POA: Diagnosis not present

## 2014-09-26 DIAGNOSIS — E119 Type 2 diabetes mellitus without complications: Secondary | ICD-10-CM | POA: Diagnosis not present

## 2014-09-26 DIAGNOSIS — Z954 Presence of other heart-valve replacement: Secondary | ICD-10-CM

## 2014-09-26 DIAGNOSIS — Z7901 Long term (current) use of anticoagulants: Secondary | ICD-10-CM

## 2014-09-26 DIAGNOSIS — Z4781 Encounter for orthopedic aftercare following surgical amputation: Secondary | ICD-10-CM | POA: Diagnosis not present

## 2014-09-26 DIAGNOSIS — J449 Chronic obstructive pulmonary disease, unspecified: Secondary | ICD-10-CM | POA: Diagnosis not present

## 2014-09-26 DIAGNOSIS — Z952 Presence of prosthetic heart valve: Secondary | ICD-10-CM

## 2014-09-26 DIAGNOSIS — Z89512 Acquired absence of left leg below knee: Secondary | ICD-10-CM | POA: Diagnosis not present

## 2014-09-26 DIAGNOSIS — I739 Peripheral vascular disease, unspecified: Secondary | ICD-10-CM | POA: Diagnosis not present

## 2014-09-26 LAB — POCT INR: INR: 4.2

## 2014-09-27 ENCOUNTER — Other Ambulatory Visit: Payer: Self-pay | Admitting: Cardiovascular Disease

## 2014-09-27 NOTE — Telephone Encounter (Signed)
Rx(s) sent to pharmacy electronically.  

## 2014-09-30 ENCOUNTER — Telehealth: Payer: Self-pay | Admitting: *Deleted

## 2014-09-30 DIAGNOSIS — I2510 Atherosclerotic heart disease of native coronary artery without angina pectoris: Secondary | ICD-10-CM | POA: Diagnosis not present

## 2014-09-30 DIAGNOSIS — Z4781 Encounter for orthopedic aftercare following surgical amputation: Secondary | ICD-10-CM | POA: Diagnosis not present

## 2014-09-30 DIAGNOSIS — Z89512 Acquired absence of left leg below knee: Secondary | ICD-10-CM | POA: Diagnosis not present

## 2014-09-30 DIAGNOSIS — J449 Chronic obstructive pulmonary disease, unspecified: Secondary | ICD-10-CM | POA: Diagnosis not present

## 2014-09-30 DIAGNOSIS — I10 Essential (primary) hypertension: Secondary | ICD-10-CM | POA: Diagnosis not present

## 2014-09-30 DIAGNOSIS — E119 Type 2 diabetes mellitus without complications: Secondary | ICD-10-CM | POA: Diagnosis not present

## 2014-09-30 DIAGNOSIS — I739 Peripheral vascular disease, unspecified: Secondary | ICD-10-CM | POA: Diagnosis not present

## 2014-09-30 NOTE — Telephone Encounter (Signed)
Returned signed [facility] to [facility].

## 2014-10-03 DIAGNOSIS — Z4781 Encounter for orthopedic aftercare following surgical amputation: Secondary | ICD-10-CM | POA: Diagnosis not present

## 2014-10-03 DIAGNOSIS — I10 Essential (primary) hypertension: Secondary | ICD-10-CM | POA: Diagnosis not present

## 2014-10-03 DIAGNOSIS — J449 Chronic obstructive pulmonary disease, unspecified: Secondary | ICD-10-CM | POA: Diagnosis not present

## 2014-10-03 DIAGNOSIS — I2510 Atherosclerotic heart disease of native coronary artery without angina pectoris: Secondary | ICD-10-CM | POA: Diagnosis not present

## 2014-10-03 DIAGNOSIS — I739 Peripheral vascular disease, unspecified: Secondary | ICD-10-CM | POA: Diagnosis not present

## 2014-10-03 DIAGNOSIS — E119 Type 2 diabetes mellitus without complications: Secondary | ICD-10-CM | POA: Diagnosis not present

## 2014-10-03 DIAGNOSIS — Z89512 Acquired absence of left leg below knee: Secondary | ICD-10-CM | POA: Diagnosis not present

## 2014-10-03 LAB — POCT INR: INR: 2.5

## 2014-10-04 ENCOUNTER — Ambulatory Visit (INDEPENDENT_AMBULATORY_CARE_PROVIDER_SITE_OTHER): Payer: Self-pay | Admitting: Pharmacist Clinician (PhC)/ Clinical Pharmacy Specialist

## 2014-10-04 DIAGNOSIS — Z7901 Long term (current) use of anticoagulants: Secondary | ICD-10-CM

## 2014-10-04 DIAGNOSIS — Z952 Presence of prosthetic heart valve: Secondary | ICD-10-CM

## 2014-10-04 DIAGNOSIS — Z954 Presence of other heart-valve replacement: Secondary | ICD-10-CM

## 2014-10-06 ENCOUNTER — Telehealth: Payer: Self-pay | Admitting: *Deleted

## 2014-10-06 DIAGNOSIS — E119 Type 2 diabetes mellitus without complications: Secondary | ICD-10-CM | POA: Diagnosis not present

## 2014-10-06 DIAGNOSIS — I2510 Atherosclerotic heart disease of native coronary artery without angina pectoris: Secondary | ICD-10-CM | POA: Diagnosis not present

## 2014-10-06 DIAGNOSIS — I10 Essential (primary) hypertension: Secondary | ICD-10-CM | POA: Diagnosis not present

## 2014-10-06 DIAGNOSIS — Z89512 Acquired absence of left leg below knee: Secondary | ICD-10-CM | POA: Diagnosis not present

## 2014-10-06 DIAGNOSIS — J449 Chronic obstructive pulmonary disease, unspecified: Secondary | ICD-10-CM | POA: Diagnosis not present

## 2014-10-06 DIAGNOSIS — I739 Peripheral vascular disease, unspecified: Secondary | ICD-10-CM | POA: Diagnosis not present

## 2014-10-06 DIAGNOSIS — Z4781 Encounter for orthopedic aftercare following surgical amputation: Secondary | ICD-10-CM | POA: Diagnosis not present

## 2014-10-06 NOTE — Telephone Encounter (Signed)
Opened in error

## 2014-10-10 ENCOUNTER — Ambulatory Visit (INDEPENDENT_AMBULATORY_CARE_PROVIDER_SITE_OTHER): Payer: Self-pay | Admitting: Pharmacist Clinician (PhC)/ Clinical Pharmacy Specialist

## 2014-10-10 DIAGNOSIS — Z4781 Encounter for orthopedic aftercare following surgical amputation: Secondary | ICD-10-CM | POA: Diagnosis not present

## 2014-10-10 DIAGNOSIS — I739 Peripheral vascular disease, unspecified: Secondary | ICD-10-CM | POA: Diagnosis not present

## 2014-10-10 DIAGNOSIS — E119 Type 2 diabetes mellitus without complications: Secondary | ICD-10-CM | POA: Diagnosis not present

## 2014-10-10 DIAGNOSIS — Z89512 Acquired absence of left leg below knee: Secondary | ICD-10-CM | POA: Diagnosis not present

## 2014-10-10 DIAGNOSIS — Z7901 Long term (current) use of anticoagulants: Secondary | ICD-10-CM

## 2014-10-10 DIAGNOSIS — J449 Chronic obstructive pulmonary disease, unspecified: Secondary | ICD-10-CM | POA: Diagnosis not present

## 2014-10-10 DIAGNOSIS — I2510 Atherosclerotic heart disease of native coronary artery without angina pectoris: Secondary | ICD-10-CM | POA: Diagnosis not present

## 2014-10-10 DIAGNOSIS — Z952 Presence of prosthetic heart valve: Secondary | ICD-10-CM

## 2014-10-10 DIAGNOSIS — Z954 Presence of other heart-valve replacement: Secondary | ICD-10-CM

## 2014-10-10 DIAGNOSIS — I10 Essential (primary) hypertension: Secondary | ICD-10-CM | POA: Diagnosis not present

## 2014-10-10 LAB — POCT INR: INR: 3.4

## 2014-10-11 ENCOUNTER — Other Ambulatory Visit (HOSPITAL_COMMUNITY): Payer: Self-pay | Admitting: Orthopaedic Surgery

## 2014-10-11 ENCOUNTER — Encounter (HOSPITAL_COMMUNITY): Payer: Self-pay | Admitting: *Deleted

## 2014-10-12 NOTE — Anesthesia Preprocedure Evaluation (Addendum)
Anesthesia Evaluation  Patient identified by MRN, date of birth, ID band Patient awake    Reviewed: Allergy & Precautions, NPO status , Patient's Chart, lab work & pertinent test results  History of Anesthesia Complications Negative for: history of anesthetic complications  Airway Mallampati: II  TM Distance: >3 FB Neck ROM: Full    Dental no notable dental hx. (+) Edentulous Upper, Edentulous Lower, Dental Advisory Given   Pulmonary shortness of breath and with exertion, sleep apnea , COPD COPD inhaler, former smoker,  breath sounds clear to auscultation  Pulmonary exam normal       Cardiovascular hypertension, Pt. on medications and Pt. on home beta blockers + angina + CAD, + Past MI, + Cardiac Stents, + CABG, + Peripheral Vascular Disease and +CHF + dysrhythmias + Valvular Problems/Murmurs AS Rhythm:Regular Rate:Normal     Neuro/Psych negative neurological ROS  negative psych ROS   GI/Hepatic Neg liver ROS, GERD-  Medicated and Controlled,  Endo/Other  diabetes, Type 2, Oral Hypoglycemic Agents  Renal/GU negative Renal ROS  negative genitourinary   Musculoskeletal  (+) Arthritis -,   Abdominal   Peds negative pediatric ROS (+)  Hematology negative hematology ROS (+)   Anesthesia Other Findings   Reproductive/Obstetrics negative OB ROS                            Anesthesia Physical Anesthesia Plan  ASA: IV  Anesthesia Plan: General   Post-op Pain Management:    Induction: Intravenous  Airway Management Planned: LMA  Additional Equipment:   Intra-op Plan:   Post-operative Plan: Extubation in OR  Informed Consent: I have reviewed the patients History and Physical, chart, labs and discussed the procedure including the risks, benefits and alternatives for the proposed anesthesia with the patient or authorized representative who has indicated his/her understanding and acceptance.    Dental advisory given  Plan Discussed with: CRNA  Anesthesia Plan Comments:         Anesthesia Quick Evaluation

## 2014-10-13 ENCOUNTER — Inpatient Hospital Stay (HOSPITAL_COMMUNITY)
Admission: RE | Admit: 2014-10-13 | Discharge: 2014-10-14 | DRG: 903 | Disposition: A | Discharge status: Home / Self Care | Payer: Medicare Other | Source: Ambulatory Visit | Attending: Orthopaedic Surgery | Admitting: Orthopaedic Surgery

## 2014-10-13 ENCOUNTER — Ambulatory Visit (HOSPITAL_COMMUNITY): Payer: Medicare Other | Admitting: Anesthesiology

## 2014-10-13 ENCOUNTER — Encounter (HOSPITAL_COMMUNITY): Payer: Self-pay | Admitting: *Deleted

## 2014-10-13 ENCOUNTER — Encounter (HOSPITAL_COMMUNITY)
Admission: RE | Disposition: A | Discharge status: Home / Self Care | Payer: Self-pay | Source: Ambulatory Visit | Attending: Orthopaedic Surgery

## 2014-10-13 DIAGNOSIS — E118 Type 2 diabetes mellitus with unspecified complications: Secondary | ICD-10-CM | POA: Diagnosis present

## 2014-10-13 DIAGNOSIS — E780 Pure hypercholesterolemia: Secondary | ICD-10-CM | POA: Diagnosis present

## 2014-10-13 DIAGNOSIS — Z955 Presence of coronary angioplasty implant and graft: Secondary | ICD-10-CM | POA: Diagnosis not present

## 2014-10-13 DIAGNOSIS — I252 Old myocardial infarction: Secondary | ICD-10-CM | POA: Diagnosis not present

## 2014-10-13 DIAGNOSIS — G4730 Sleep apnea, unspecified: Secondary | ICD-10-CM | POA: Diagnosis present

## 2014-10-13 DIAGNOSIS — J449 Chronic obstructive pulmonary disease, unspecified: Secondary | ICD-10-CM | POA: Diagnosis present

## 2014-10-13 DIAGNOSIS — T8131XD Disruption of external operation (surgical) wound, not elsewhere classified, subsequent encounter: Secondary | ICD-10-CM

## 2014-10-13 DIAGNOSIS — I2510 Atherosclerotic heart disease of native coronary artery without angina pectoris: Secondary | ICD-10-CM | POA: Diagnosis present

## 2014-10-13 DIAGNOSIS — I10 Essential (primary) hypertension: Secondary | ICD-10-CM | POA: Diagnosis present

## 2014-10-13 DIAGNOSIS — I739 Peripheral vascular disease, unspecified: Secondary | ICD-10-CM | POA: Diagnosis not present

## 2014-10-13 DIAGNOSIS — Z79899 Other long term (current) drug therapy: Secondary | ICD-10-CM

## 2014-10-13 DIAGNOSIS — M1990 Unspecified osteoarthritis, unspecified site: Secondary | ICD-10-CM | POA: Diagnosis present

## 2014-10-13 DIAGNOSIS — K219 Gastro-esophageal reflux disease without esophagitis: Secondary | ICD-10-CM | POA: Diagnosis present

## 2014-10-13 DIAGNOSIS — I25810 Atherosclerosis of coronary artery bypass graft(s) without angina pectoris: Secondary | ICD-10-CM | POA: Diagnosis not present

## 2014-10-13 DIAGNOSIS — Y835 Amputation of limb(s) as the cause of abnormal reaction of the patient, or of later complication, without mention of misadventure at the time of the procedure: Secondary | ICD-10-CM | POA: Diagnosis present

## 2014-10-13 DIAGNOSIS — T8130XA Disruption of wound, unspecified, initial encounter: Principal | ICD-10-CM | POA: Diagnosis present

## 2014-10-13 DIAGNOSIS — T8131XA Disruption of external operation (surgical) wound, not elsewhere classified, initial encounter: Secondary | ICD-10-CM

## 2014-10-13 DIAGNOSIS — Z89519 Acquired absence of unspecified leg below knee: Secondary | ICD-10-CM

## 2014-10-13 DIAGNOSIS — Z79891 Long term (current) use of opiate analgesic: Secondary | ICD-10-CM

## 2014-10-13 DIAGNOSIS — Z87891 Personal history of nicotine dependence: Secondary | ICD-10-CM

## 2014-10-13 DIAGNOSIS — Z7982 Long term (current) use of aspirin: Secondary | ICD-10-CM

## 2014-10-13 DIAGNOSIS — T8781 Dehiscence of amputation stump: Secondary | ICD-10-CM | POA: Diagnosis not present

## 2014-10-13 DIAGNOSIS — Z952 Presence of prosthetic heart valve: Secondary | ICD-10-CM | POA: Diagnosis not present

## 2014-10-13 DIAGNOSIS — Z7902 Long term (current) use of antithrombotics/antiplatelets: Secondary | ICD-10-CM | POA: Diagnosis not present

## 2014-10-13 DIAGNOSIS — T8752 Necrosis of amputation stump, left upper extremity: Secondary | ICD-10-CM | POA: Diagnosis not present

## 2014-10-13 DIAGNOSIS — I509 Heart failure, unspecified: Secondary | ICD-10-CM | POA: Diagnosis present

## 2014-10-13 DIAGNOSIS — E119 Type 2 diabetes mellitus without complications: Secondary | ICD-10-CM | POA: Diagnosis not present

## 2014-10-13 DIAGNOSIS — M109 Gout, unspecified: Secondary | ICD-10-CM | POA: Diagnosis not present

## 2014-10-13 DIAGNOSIS — Z951 Presence of aortocoronary bypass graft: Secondary | ICD-10-CM

## 2014-10-13 HISTORY — PX: STUMP REVISION: SHX6102

## 2014-10-13 LAB — CBC
HEMATOCRIT: 27.6 % — AB (ref 39.0–52.0)
HEMOGLOBIN: 8.4 g/dL — AB (ref 13.0–17.0)
MCH: 27.5 pg (ref 26.0–34.0)
MCHC: 30.4 g/dL (ref 30.0–36.0)
MCV: 90.5 fL (ref 78.0–100.0)
Platelets: 313 10*3/uL (ref 150–400)
RBC: 3.05 MIL/uL — ABNORMAL LOW (ref 4.22–5.81)
RDW: 16.4 % — ABNORMAL HIGH (ref 11.5–15.5)
WBC: 6.7 10*3/uL (ref 4.0–10.5)

## 2014-10-13 LAB — BASIC METABOLIC PANEL
Anion gap: 3 — ABNORMAL LOW (ref 5–15)
BUN: 20 mg/dL (ref 6–23)
CHLORIDE: 110 mmol/L (ref 96–112)
CO2: 25 mmol/L (ref 19–32)
Calcium: 8.7 mg/dL (ref 8.4–10.5)
Creatinine, Ser: 0.91 mg/dL (ref 0.50–1.35)
GFR calc Af Amer: >90 mL/min (ref >90)
GFR calc non Af Amer: 82 mL/min — ABNORMAL LOW (ref >90)
Glucose, Bld: 124 mg/dL — ABNORMAL HIGH (ref 70–99)
POTASSIUM: 3.7 mmol/L (ref 3.5–5.1)
Sodium: 138 mmol/L (ref 135–145)

## 2014-10-13 LAB — GLUCOSE, CAPILLARY
GLUCOSE-CAPILLARY: 133 mg/dL — AB (ref 70–99)
Glucose-Capillary: 118 mg/dL — ABNORMAL HIGH (ref 70–99)
Glucose-Capillary: 125 mg/dL — ABNORMAL HIGH (ref 70–99)
Glucose-Capillary: 159 mg/dL — ABNORMAL HIGH (ref 70–99)

## 2014-10-13 LAB — APTT: aPTT: 45 seconds — ABNORMAL HIGH (ref 24–37)

## 2014-10-13 LAB — PROTIME-INR
INR: 2.94 — AB (ref 0.00–1.49)
Prothrombin Time: 30.9 seconds — ABNORMAL HIGH (ref 11.6–15.2)

## 2014-10-13 SURGERY — REVISION, AMPUTATION SITE
Anesthesia: General | Site: Leg Lower | Laterality: Left

## 2014-10-13 MED ORDER — WARFARIN SODIUM 5 MG PO TABS
5.0000 mg | ORAL_TABLET | ORAL | Status: DC
  Administered 2014-10-13: 5 mg via ORAL
  Filled 2014-10-13: qty 1

## 2014-10-13 MED ORDER — LACTATED RINGERS IV SOLN
INTRAVENOUS | Status: DC | PRN
  Administered 2014-10-13 (×2): via INTRAVENOUS

## 2014-10-13 MED ORDER — ONDANSETRON HCL 4 MG/2ML IJ SOLN
INTRAMUSCULAR | Status: AC
  Filled 2014-10-13: qty 2

## 2014-10-13 MED ORDER — METOCLOPRAMIDE HCL 10 MG PO TABS: 5.0000 mg | ORAL_TABLET | Freq: Three times a day (TID) | ORAL | Status: DC | PRN

## 2014-10-13 MED ORDER — SODIUM CHLORIDE 0.9 % IV SOLN
INTRAVENOUS | Status: DC
  Administered 2014-10-13: 18:00:00 via INTRAVENOUS

## 2014-10-13 MED ORDER — METHOCARBAMOL 500 MG PO TABS
500.0000 mg | ORAL_TABLET | Freq: Four times a day (QID) | ORAL | Status: DC | PRN
  Administered 2014-10-13 – 2014-10-14 (×2): 500 mg via ORAL
  Filled 2014-10-13 (×3): qty 1

## 2014-10-13 MED ORDER — FENOFIBRATE 160 MG PO TABS
160.0000 mg | ORAL_TABLET | Freq: Every day | ORAL | Status: DC
  Administered 2014-10-13 – 2014-10-14 (×2): 160 mg via ORAL
  Filled 2014-10-13 (×2): qty 1

## 2014-10-13 MED ORDER — DIPHENHYDRAMINE HCL 12.5 MG/5ML PO ELIX: 12.5000 mg | ORAL_SOLUTION | ORAL | Status: DC | PRN

## 2014-10-13 MED ORDER — PHENYLEPHRINE 40 MCG/ML (10ML) SYRINGE FOR IV PUSH (FOR BLOOD PRESSURE SUPPORT)
PREFILLED_SYRINGE | INTRAVENOUS | Status: AC
  Filled 2014-10-13: qty 10

## 2014-10-13 MED ORDER — ROSUVASTATIN CALCIUM 20 MG PO TABS
20.0000 mg | ORAL_TABLET | Freq: Every day | ORAL | Status: DC
  Administered 2014-10-13: 20 mg via ORAL
  Filled 2014-10-13 (×2): qty 1

## 2014-10-13 MED ORDER — POTASSIUM CHLORIDE CRYS ER 20 MEQ PO TBCR
20.0000 meq | EXTENDED_RELEASE_TABLET | Freq: Every day | ORAL | Status: DC
  Administered 2014-10-13 – 2014-10-14 (×2): 20 meq via ORAL
  Filled 2014-10-13 (×2): qty 1

## 2014-10-13 MED ORDER — ONDANSETRON HCL 4 MG/2ML IJ SOLN: 4.0000 mg | Freq: Four times a day (QID) | INTRAMUSCULAR | Status: DC | PRN

## 2014-10-13 MED ORDER — WARFARIN - PHYSICIAN DOSING INPATIENT
Freq: Every day | Status: DC
  Administered 2014-10-13: 18:00:00

## 2014-10-13 MED ORDER — METOPROLOL SUCCINATE ER 50 MG PO TB24
50.0000 mg | ORAL_TABLET | Freq: Every day | ORAL | Status: DC
  Administered 2014-10-14: 50 mg via ORAL
  Filled 2014-10-13: qty 1

## 2014-10-13 MED ORDER — HYDROMORPHONE HCL 1 MG/ML IJ SOLN
INTRAMUSCULAR | Status: AC
  Filled 2014-10-13: qty 1

## 2014-10-13 MED ORDER — SODIUM CHLORIDE 0.9 % IR SOLN
Status: DC | PRN
  Administered 2014-10-13: 3000 mL

## 2014-10-13 MED ORDER — CEFAZOLIN SODIUM-DEXTROSE 2-3 GM-% IV SOLR
INTRAVENOUS | Status: AC
  Filled 2014-10-13: qty 50

## 2014-10-13 MED ORDER — IPRATROPIUM-ALBUTEROL 0.5-2.5 (3) MG/3ML IN SOLN
3.0000 mL | Freq: Four times a day (QID) | RESPIRATORY_TRACT | Status: DC
  Filled 2014-10-13: qty 3

## 2014-10-13 MED ORDER — VITAMIN C 500 MG PO TABS
1000.0000 mg | ORAL_TABLET | Freq: Every day | ORAL | Status: DC
  Administered 2014-10-14: 1000 mg via ORAL
  Filled 2014-10-13: qty 2

## 2014-10-13 MED ORDER — FENTANYL CITRATE 0.05 MG/ML IJ SOLN
INTRAMUSCULAR | Status: AC
  Filled 2014-10-13: qty 2

## 2014-10-13 MED ORDER — WARFARIN SODIUM 7.5 MG PO TABS
7.5000 mg | ORAL_TABLET | ORAL | Status: DC
  Filled 2014-10-13: qty 1

## 2014-10-13 MED ORDER — HYDROMORPHONE HCL 1 MG/ML IJ SOLN
0.2500 mg | INTRAMUSCULAR | Status: DC | PRN
  Administered 2014-10-13 (×2): 0.25 mg via INTRAVENOUS
  Administered 2014-10-13: 0.5 mg via INTRAVENOUS

## 2014-10-13 MED ORDER — GLIMEPIRIDE 1 MG PO TABS
1.0000 mg | ORAL_TABLET | Freq: Two times a day (BID) | ORAL | Status: DC
  Administered 2014-10-13 – 2014-10-14 (×2): 1 mg via ORAL
  Filled 2014-10-13 (×3): qty 1

## 2014-10-13 MED ORDER — NITROGLYCERIN 0.4 MG SL SUBL: 0.4000 mg | SUBLINGUAL_TABLET | SUBLINGUAL | Status: DC | PRN

## 2014-10-13 MED ORDER — FENTANYL CITRATE 0.05 MG/ML IJ SOLN
25.0000 ug | INTRAMUSCULAR | Status: DC | PRN
  Administered 2014-10-13 (×3): 50 ug via INTRAVENOUS

## 2014-10-13 MED ORDER — OXYCODONE HCL 5 MG PO TABS
5.0000 mg | ORAL_TABLET | ORAL | Status: DC | PRN
  Administered 2014-10-13 (×2): 15 mg via ORAL
  Administered 2014-10-13: 10 mg via ORAL
  Administered 2014-10-13: 5 mg via ORAL
  Administered 2014-10-14 (×2): 15 mg via ORAL
  Filled 2014-10-13: qty 1
  Filled 2014-10-13: qty 3
  Filled 2014-10-13: qty 2
  Filled 2014-10-13 (×3): qty 3

## 2014-10-13 MED ORDER — RAMIPRIL 5 MG PO CAPS
5.0000 mg | ORAL_CAPSULE | Freq: Every day | ORAL | Status: DC
  Administered 2014-10-14: 5 mg via ORAL
  Filled 2014-10-13 (×2): qty 1

## 2014-10-13 MED ORDER — ALBUTEROL SULFATE ER 4 MG PO TB12
4.0000 mg | ORAL_TABLET | Freq: Two times a day (BID) | ORAL | Status: DC
  Administered 2014-10-13 – 2014-10-14 (×2): 4 mg via ORAL
  Filled 2014-10-13 (×3): qty 1

## 2014-10-13 MED ORDER — ONDANSETRON HCL 4 MG/2ML IJ SOLN: 4.0000 mg | Freq: Once | INTRAMUSCULAR | Status: DC | PRN

## 2014-10-13 MED ORDER — CEFAZOLIN SODIUM 1-5 GM-% IV SOLN
1.0000 g | Freq: Four times a day (QID) | INTRAVENOUS | Status: AC
  Administered 2014-10-13 – 2014-10-14 (×3): 1 g via INTRAVENOUS
  Filled 2014-10-13 (×4): qty 50

## 2014-10-13 MED ORDER — EPHEDRINE SULFATE 50 MG/ML IJ SOLN
INTRAMUSCULAR | Status: DC | PRN
  Administered 2014-10-13: 2.5 mg via INTRAVENOUS
  Administered 2014-10-13: 7.5 mg via INTRAVENOUS
  Administered 2014-10-13: 5 mg via INTRAVENOUS

## 2014-10-13 MED ORDER — METOCLOPRAMIDE HCL 5 MG/ML IJ SOLN: 5.0000 mg | Freq: Three times a day (TID) | INTRAMUSCULAR | Status: DC | PRN

## 2014-10-13 MED ORDER — ONDANSETRON HCL 4 MG/2ML IJ SOLN
INTRAMUSCULAR | Status: DC | PRN
  Administered 2014-10-13: 4 mg via INTRAVENOUS

## 2014-10-13 MED ORDER — PREGABALIN 50 MG PO CAPS
50.0000 mg | ORAL_CAPSULE | Freq: Two times a day (BID) | ORAL | Status: DC
Start: 2014-10-13 — End: 2014-10-14
  Administered 2014-10-13 – 2014-10-14 (×2): 50 mg via ORAL
  Filled 2014-10-13 (×2): qty 1

## 2014-10-13 MED ORDER — PROPOFOL 10 MG/ML IV BOLUS
INTRAVENOUS | Status: AC
  Filled 2014-10-13: qty 20

## 2014-10-13 MED ORDER — PROPOFOL 10 MG/ML IV BOLUS
INTRAVENOUS | Status: DC | PRN
  Administered 2014-10-13: 100 mg via INTRAVENOUS

## 2014-10-13 MED ORDER — ZINC GLUCONATE 50 MG PO TABS: 50.0000 mg | ORAL_TABLET | Freq: Every day | ORAL | Status: DC

## 2014-10-13 MED ORDER — ONDANSETRON HCL 4 MG PO TABS: 4.0000 mg | ORAL_TABLET | Freq: Four times a day (QID) | ORAL | Status: DC | PRN

## 2014-10-13 MED ORDER — WARFARIN SODIUM 5 MG PO TABS: 5.0000 mg | ORAL_TABLET | Freq: Every day | ORAL | Status: DC

## 2014-10-13 MED ORDER — ISOSORBIDE MONONITRATE ER 30 MG PO TB24
30.0000 mg | ORAL_TABLET | Freq: Every day | ORAL | Status: DC
  Administered 2014-10-14: 30 mg via ORAL
  Filled 2014-10-13: qty 1

## 2014-10-13 MED ORDER — FENTANYL CITRATE 0.05 MG/ML IJ SOLN
INTRAMUSCULAR | Status: AC
  Filled 2014-10-13: qty 5

## 2014-10-13 MED ORDER — VITAMIN D3 25 MCG (1000 UNIT) PO TABS
2000.0000 [IU] | ORAL_TABLET | Freq: Every day | ORAL | Status: DC
  Administered 2014-10-14: 2000 [IU] via ORAL
  Filled 2014-10-13: qty 2

## 2014-10-13 MED ORDER — METHOCARBAMOL 1000 MG/10ML IJ SOLN
500.0000 mg | Freq: Four times a day (QID) | INTRAVENOUS | Status: DC | PRN
  Administered 2014-10-13: 500 mg via INTRAVENOUS
  Filled 2014-10-13 (×2): qty 5

## 2014-10-13 MED ORDER — LACTATED RINGERS IV SOLN: INTRAVENOUS | Status: DC

## 2014-10-13 MED ORDER — CLOPIDOGREL BISULFATE 75 MG PO TABS
75.0000 mg | ORAL_TABLET | Freq: Every day | ORAL | Status: DC
  Administered 2014-10-14: 75 mg via ORAL
  Filled 2014-10-13 (×2): qty 1

## 2014-10-13 MED ORDER — LINAGLIPTIN 5 MG PO TABS
5.0000 mg | ORAL_TABLET | Freq: Every day | ORAL | Status: DC
  Administered 2014-10-14: 5 mg via ORAL
  Filled 2014-10-13: qty 1

## 2014-10-13 MED ORDER — GLYCOPYRROLATE 0.2 MG/ML IJ SOLN
INTRAMUSCULAR | Status: AC
  Filled 2014-10-13: qty 1

## 2014-10-13 MED ORDER — IPRATROPIUM-ALBUTEROL 0.5-2.5 (3) MG/3ML IN SOLN: 3.0000 mL | RESPIRATORY_TRACT | Status: DC | PRN

## 2014-10-13 MED ORDER — PHENYLEPHRINE HCL 10 MG/ML IJ SOLN
INTRAMUSCULAR | Status: DC | PRN
  Administered 2014-10-13: 80 ug via INTRAVENOUS

## 2014-10-13 MED ORDER — FENTANYL CITRATE 0.05 MG/ML IJ SOLN
INTRAMUSCULAR | Status: DC | PRN
  Administered 2014-10-13 (×2): 50 ug via INTRAVENOUS
  Administered 2014-10-13 (×2): 25 ug via INTRAVENOUS

## 2014-10-13 MED ORDER — MORPHINE SULFATE 4 MG/ML IJ SOLN
4.0000 mg | INTRAMUSCULAR | Status: DC | PRN
  Administered 2014-10-13: 4 mg via INTRAVENOUS
  Filled 2014-10-13: qty 1

## 2014-10-13 MED ORDER — HYDROCODONE-ACETAMINOPHEN 5-325 MG PO TABS: 1.0000 | ORAL_TABLET | ORAL | Status: DC | PRN

## 2014-10-13 MED ORDER — ALLOPURINOL 300 MG PO TABS
300.0000 mg | ORAL_TABLET | Freq: Every day | ORAL | Status: DC
  Administered 2014-10-13: 300 mg via ORAL
  Filled 2014-10-13 (×2): qty 1

## 2014-10-13 MED ORDER — 0.9 % SODIUM CHLORIDE (POUR BTL) OPTIME
TOPICAL | Status: DC | PRN
  Administered 2014-10-13: 1000 mL

## 2014-10-13 MED ORDER — FUROSEMIDE 40 MG PO TABS
40.0000 mg | ORAL_TABLET | Freq: Every day | ORAL | Status: DC
  Administered 2014-10-13 – 2014-10-14 (×2): 40 mg via ORAL
  Filled 2014-10-13 (×2): qty 1

## 2014-10-13 MED ORDER — CEFAZOLIN SODIUM-DEXTROSE 2-3 GM-% IV SOLR
2.0000 g | INTRAVENOUS | Status: AC
  Administered 2014-10-13: 2 g via INTRAVENOUS

## 2014-10-13 MED ORDER — ETOMIDATE 2 MG/ML IV SOLN
INTRAVENOUS | Status: DC | PRN
  Administered 2014-10-13: 10 mg via INTRAVENOUS

## 2014-10-13 SURGICAL SUPPLY — 64 items
BAG SPEC THK2 15X12 ZIP CLS (MISCELLANEOUS) ×1
BAG ZIPLOCK 12X15 (MISCELLANEOUS) ×3 IMPLANT
BANDAGE ESMARK 6X9 LF (GAUZE/BANDAGES/DRESSINGS) ×1 IMPLANT
BLADE SAGITTAL 25.0X1.37X90 (BLADE) ×2 IMPLANT
BLADE SAGITTAL 25.0X1.37X90MM (BLADE) ×1
BNDG CMPR 9X6 STRL LF SNTH (GAUZE/BANDAGES/DRESSINGS) ×1
BNDG COHESIVE 4X5 TAN STRL (GAUZE/BANDAGES/DRESSINGS) ×2 IMPLANT
BNDG COHESIVE 6X5 TAN STRL LF (GAUZE/BANDAGES/DRESSINGS) ×4 IMPLANT
BNDG ESMARK 6X9 LF (GAUZE/BANDAGES/DRESSINGS) ×3
BNDG GAUZE ELAST 4 BULKY (GAUZE/BANDAGES/DRESSINGS) ×8 IMPLANT
COVER SURGICAL LIGHT HANDLE (MISCELLANEOUS) ×1 IMPLANT
CUFF TOURN SGL QUICK 34 (TOURNIQUET CUFF) ×3
CUFF TRNQT CYL 34X4X40X1 (TOURNIQUET CUFF) ×1 IMPLANT
DRAIN PENROSE 18X1/2 LTX STRL (DRAIN) IMPLANT
DRAPE ORTHO SPLIT 77X108 STRL (DRAPES) ×6
DRAPE SHEET LG 3/4 BI-LAMINATE (DRAPES) ×2 IMPLANT
DRAPE SURG ORHT 6 SPLT 77X108 (DRAPES) IMPLANT
DRAPE U-SHAPE 47X51 STRL (DRAPES) ×3 IMPLANT
DURAPREP 26ML APPLICATOR (WOUND CARE) ×1 IMPLANT
ELECT REM PT RETURN 9FT ADLT (ELECTROSURGICAL) ×3
ELECTRODE REM PT RTRN 9FT ADLT (ELECTROSURGICAL) ×1 IMPLANT
EVACUATOR 1/8 PVC DRAIN (DRAIN) IMPLANT
GAUZE SPONGE 4X4 12PLY STRL (GAUZE/BANDAGES/DRESSINGS) ×3 IMPLANT
GAUZE XEROFORM 5X9 LF (GAUZE/BANDAGES/DRESSINGS) ×3 IMPLANT
GLOVE BIO SURGEON STRL SZ8 (GLOVE) ×2 IMPLANT
GLOVE BIOGEL PI IND STRL 6.5 (GLOVE) IMPLANT
GLOVE BIOGEL PI IND STRL 7.0 (GLOVE) IMPLANT
GLOVE BIOGEL PI IND STRL 8 (GLOVE) ×1 IMPLANT
GLOVE BIOGEL PI IND STRL 8.5 (GLOVE) IMPLANT
GLOVE BIOGEL PI INDICATOR 6.5 (GLOVE) ×2
GLOVE BIOGEL PI INDICATOR 7.0 (GLOVE) ×2
GLOVE BIOGEL PI INDICATOR 8 (GLOVE) ×4
GLOVE BIOGEL PI INDICATOR 8.5 (GLOVE) ×2
GLOVE ECLIPSE 6.5 STRL STRAW (GLOVE) ×2 IMPLANT
GLOVE ECLIPSE 8.0 STRL XLNG CF (GLOVE) ×3 IMPLANT
GLOVE ORTHO TXT STRL SZ7.5 (GLOVE) ×3 IMPLANT
GOWN STRL REUS W/ TWL LRG LVL3 (GOWN DISPOSABLE) IMPLANT
GOWN STRL REUS W/TWL LRG LVL3 (GOWN DISPOSABLE) ×3
GOWN STRL REUS W/TWL XL LVL3 (GOWN DISPOSABLE) ×7 IMPLANT
HANDPIECE INTERPULSE COAX TIP (DISPOSABLE) ×3
KIT BASIN OR (CUSTOM PROCEDURE TRAY) ×3 IMPLANT
NS IRRIG 1000ML POUR BTL (IV SOLUTION) ×3 IMPLANT
PACK ORTHO EXTREMITY (CUSTOM PROCEDURE TRAY) ×3 IMPLANT
PAD ABD 8X10 STRL (GAUZE/BANDAGES/DRESSINGS) ×4 IMPLANT
PAD CAST 4YDX4 CTTN HI CHSV (CAST SUPPLIES) ×4 IMPLANT
PADDING CAST COTTON 4X4 STRL (CAST SUPPLIES) ×3
PADDING CAST COTTON 6X4 STRL (CAST SUPPLIES) ×2 IMPLANT
POSITIONER SURGICAL ARM (MISCELLANEOUS) ×3 IMPLANT
SET HNDPC FAN SPRY TIP SCT (DISPOSABLE) IMPLANT
SPONGE LAP 18X18 X RAY DECT (DISPOSABLE) ×10 IMPLANT
STAPLER VISISTAT 35W (STAPLE) ×1 IMPLANT
STOCKINETTE 8 INCH (MISCELLANEOUS) ×3 IMPLANT
SUT ETHILON 2 0 PSLX (SUTURE) ×6 IMPLANT
SUT SILK 2 0 (SUTURE)
SUT SILK 2 0 SH CR/8 (SUTURE) ×3 IMPLANT
SUT SILK 2-0 18XBRD TIE 12 (SUTURE) IMPLANT
SUT VIC AB 0 CT1 36 (SUTURE) ×8 IMPLANT
SUT VIC AB 2-0 CT1 27 (SUTURE) ×9
SUT VIC AB 2-0 CT1 TAPERPNT 27 (SUTURE) ×2 IMPLANT
SWAB COLLECTION DEVICE MRSA (MISCELLANEOUS) IMPLANT
TIP HIGH FLOW IRRIGATION COAX (MISCELLANEOUS) ×2 IMPLANT
TOWEL OR 17X26 10 PK STRL BLUE (TOWEL DISPOSABLE) ×7 IMPLANT
TUBE ANAEROBIC SPECIMEN COL (MISCELLANEOUS) IMPLANT
WATER STERILE IRR 1500ML POUR (IV SOLUTION) ×1 IMPLANT

## 2014-10-13 NOTE — Brief Op Note (Signed)
10/13/2014  10:21 AM  PATIENT:  Tanner Pena  74 y.o. male  PRE-OPERATIVE DIAGNOSIS:  Left below knee amputation dehiscence  POST-OPERATIVE DIAGNOSIS:  Left below knee amputation dehiscence  PROCEDURE:  Procedure(s): REVISION LEFT BELOW KNEE AMPUTATION STUMP (Left)  SURGEON:  Surgeon(s) and Role:    * [name], MD - Primary  PHYSICIAN ASSISTANT: Benita Stabile, PA-C   ANESTHESIA:   general  EBL:  Total I/O In: 1000 [I.V.:1000] Out: 150 [Blood:150]  BLOOD ADMINISTERED:none  DRAINS: none   LOCAL MEDICATIONS USED:  NONE  SPECIMEN:  No Specimen  DISPOSITION OF SPECIMEN:  N/A  COUNTS:  YES  TOURNIQUET:   Total Tourniquet Time Documented: Thigh (Left) - 13 minutes Total: Thigh (Left) - 13 minutes   DICTATION: .Other Dictation: Dictation Number [id number]  PLAN OF CARE: Admit to inpatient   PATIENT DISPOSITION:  PACU - hemodynamically stable.   Delay start of Pharmacological VTE agent (>24hrs) due to surgical blood loss or risk of bleeding: no

## 2014-10-13 NOTE — Anesthesia Procedure Notes (Signed)
Procedure Name: LMA Insertion Date/Time: 10/13/2014 8:55 AM Performed by: Johnathan Hausen A Pre-anesthesia Checklist: Patient identified, Timeout performed, Emergency Drugs available, Suction available and Patient being monitored Patient Re-evaluated:Patient Re-evaluated prior to inductionOxygen Delivery Method: Circle system utilized Preoxygenation: Pre-oxygenation with 100% oxygen Intubation Type: IV induction LMA: LMA with gastric port inserted LMA Size: 5.0 Tube type: Oral Number of attempts: 1 Placement Confirmation: positive ETCO2 Tube secured with: Tape Dental Injury: Teeth and Oropharynx as per pre-operative assessment

## 2014-10-13 NOTE — H&P (Signed)
Tanner Pena is an 74 y.o. male.   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  Tanner Pena understands the need to proceed to surgery for an I&D of his left BKA stump, closure of the wound, and possible revision amputation.  Past Medical History  Diagnosis Date  . Diabetes mellitus 2007  . Hypertension   . Gout   . Hypercholesteremia   . Peripheral vascular disease     stents lower extremity  . COPD (chronic obstructive pulmonary disease)   . S/P aortic valve replacement 1990    a. St. Jude  . Chronic back pain   . Dysphagia   . Neuromuscular disorder   . Peripheral neuropathy   . Critical lower limb ischemia   . CAD (coronary artery disease)     a. 05/13/14 [name] s/p overlapping DESx2 to SVG to RCA. b.  s/p CABG '90 with redo '94 & stent to RCA SVG in 2005  . Chronic toe ulcer     a. left foot  . Shortness of breath   . Myocardial infarction   . CHF (congestive heart failure)   . Stone in kidney   . GERD (gastroesophageal reflux disease)   . Arthritis   . Sleep apnea     tested greater than 7 years ago per patient    Past Surgical History  Procedure Laterality Date  . Open heart surgery  1990    prosthetic heart valve, one bypass  . Rotator cuff repair      right  . Cataract extraction      bilateral  . Coronary stent placement  2005    RCA vein graft A 3.0x13.0 TAXUS stent was then placed int Tanner Pena vessel a Viva 3.0x4.0 (perfusion balloon was made ready it was placed through the entire lenght of the stent  . Peripheral vascular procedures lower extremities      right external iliac  artery PTA and stenting as well as bilateral SFA intervention remotely. Repeat procedures in 2011 bilaterally  . Coronary artery bypass graft  1994    6 vessels  . [name]  06/13/2011    Procedure: Venia Minks  DILATION;  Surgeon: [name], MD;  Location: [facility];  Service: Endoscopy;  Laterality: N/A;  Dilated to 56.   . Angioplasty illiac artery    . Back surgery  1191,4782    2  . Eye surgery    . Amputation Left 07/13/2014    Procedure: Transmetatarsal Amputation;  Surgeon: [name], MD;  Location: [place];  Service: Orthopedics;  Laterality: Left;  . Lower extremity angiogram N/A 02/15/2013    Procedure: LOWER EXTREMITY ANGIOGRAM;  Surgeon: [name], MD;  Location: [facility] CATH LAB;  Service: Cardiovascular;  Laterality: N/A;  . Left heart catheterization with coronary angiogram N/A 05/11/2014    Procedure: LEFT HEART CATHETERIZATION WITH CORONARY ANGIOGRAM;  Surgeon: [name], MD;  Location: [facility] CATH LAB;  Service: Cardiovascular;  Laterality: N/A;  . Percutaneous coronary stent intervention (pci-s) N/A 05/13/2014    Procedure: PERCUTANEOUS CORONARY STENT INTERVENTION (PCI-S);  Surgeon: [name], MD;  Location: [facility] CATH LAB;  Service: Cardiovascular;  Laterality: N/A;  . Lower extremity angiogram N/A 06/06/2014    Procedure: LOWER EXTREMITY ANGIOGRAM;  Surgeon: [name],  MD;  Location: [place] CATH LAB;  Service: Cardiovascular;  Laterality: N/A;  . Amputation Left 08/20/2014    Procedure: Revision Transmetatarsal Amputation versus Below Knee Amputation;  Surgeon: [name], MD;  Location: [place];  Service: Orthopedics;  Laterality: Left;  . Stump revision Left 09/23/2014    Procedure: Revision Left Below Knee Amputation;  Surgeon: [name], MD;  Location: [place];  Service: Orthopedics;  Laterality: Left;    Family History  Problem Relation Age of Onset  . Colon cancer Neg Hx   . Liver disease Neg Hx    Social History:  reports that Tanner Pena quit smoking about 5 months ago. His smoking use included Cigarettes. Tanner Pena started smoking about 61 years ago. Tanner Pena has a 55 pack-year smoking history. His smokeless tobacco use includes Chew. Tanner Pena reports that Tanner Pena does not drink  alcohol or use illicit drugs.  Allergies: No Known Allergies  Medications Prior to Admission  Medication Sig Dispense Refill  . acetaminophen (TYLENOL) 500 MG tablet Take 500 mg by mouth every 6 (six) hours as needed for moderate pain.    [name] albuterol (VOSPIRE ER) 4 MG 12 hr tablet Take 4 mg by mouth 2 (two) times daily.     [name] albuterol-ipratropium (COMBIVENT) 18-103 MCG/ACT inhaler Inhale 1 puff into the lungs 4 (four) times daily. Coughing/ Shortness of Breath    . allopurinol (ZYLOPRIM) 300 MG tablet Take 300 mg by mouth at bedtime.     . Ascorbic Acid (VITAMIN C WITH ROSE HIPS) 1000 MG tablet Take 1,000 mg by mouth daily.    . cholecalciferol (VITAMIN D) 1000 UNITS tablet Take 2,000 Units by mouth daily.    . clopidogrel (PLAVIX) 75 MG tablet Take 1 tablet (75 mg total) by mouth daily with breakfast. 30 tablet 11  . fenofibrate (TRICOR) 145 MG tablet Take 1 tablet (145 mg total) by mouth daily. 30 tablet 9  . fish oil-omega-3 fatty acids 1000 MG capsule Take 1 capsule (1 g total) by mouth 2 (two) times daily.    . furosemide (LASIX) 80 MG tablet Take 40 mg by mouth daily.    [name] glimepiride (AMARYL) 1 MG tablet Take 1 mg by mouth 2 (two) times daily.    . isosorbide mononitrate (IMDUR) 30 MG 24 hr tablet Take 1 tablet (30 mg total) by mouth daily. <please make appointment with Dr. [name] for refills> 30 tablet 1  . metoprolol succinate (TOPROL-XL) 50 MG 24 hr tablet Take 1 tablet (50 mg total) by mouth daily. Take with or immediately following a meal. 30 tablet 11  . nitroGLYCERIN (NITROSTAT) 0.4 MG SL tablet Place 1 tablet (0.4 mg total) under the tongue every 5 (five) minutes as needed for chest pain. 25 tablet 12  . oxyCODONE-acetaminophen (PERCOCET) 10-325 MG per tablet Take 1 tablet by mouth every 4 (four) hours as needed for pain. 30 tablet 0  . potassium chloride SA (K-DUR,KLOR-CON) 20 MEQ tablet Take 20 mEq by mouth daily.      . pregabalin (LYRICA) 50 MG capsule Take one capsule by  mouth twice daily for pains 60 capsule 5  . ramipril (ALTACE) 2.5 MG capsule TAKE 1 CAPSULE BY MOUTH AT BEDTIME. 30 capsule 7  . ramipril (ALTACE) 5 MG capsule TAKE 1 CAPSULE BY MOUTH EVERY MORNING. 30 capsule 7  . rosuvastatin (CRESTOR) 20 MG tablet Take 20 mg by mouth at bedtime.    . sitaGLIPtin (JANUVIA) 50 MG tablet Take 50 mg by mouth daily.    [name]  warfarin (COUMADIN) 5 MG tablet Take 1-1.5 tablets (5-7.5 mg total) by mouth at bedtime. Takes one and one-half tablet (7.7m total) on Mondays and Fridays. Takes one tablet (530mtotal) on all other days (Patient taking differently: Take 5-7.5 mg by mouth at bedtime. Takes one and one-half tablet (7.40m41motal) on Mondays and  Fridays. Takes one tablet (40mg60mtal) on all other days) 30 tablet 11  . zinc gluconate 50 MG tablet Take 50 mg by mouth daily.    . asMarland Kitchenirin EC 325 MG tablet Take 1 tablet (325 mg total) by mouth daily. (Patient not taking: Reported on 10/13/2014) 30 tablet 0  . cephALEXin (KEFLEX) 500 MG capsule Take 1 capsule (500 mg total) by mouth 2 (two) times daily. (Patient not taking: Reported on 08/20/2014) 14 capsule 0  . nicotine (NICODERM CQ - DOSED IN MG/24 HOURS) 14 mg/24hr patch Place 1 patch (14 mg total) onto the skin daily. (Patient not taking: Reported on 08/20/2014) 28 patch 0  . oxyCODONE-acetaminophen (ROXICET) 5-325 MG per tablet Take 1 tablet by mouth every 4 (four) hours as needed for severe pain. (Patient not taking: Reported on 09/23/2014) 60 tablet 0    Results for orders placed or performed during the hospital encounter of 10/13/14 (from the past 48 hour(s))  Glucose, capillary     Status: Abnormal   Collection Time: 10/13/14  6:32 AM  Result Value Ref Range   Glucose-Capillary 118 (H) 70 - 99 mg/dL   Comment 1 Notify RN   Protime-INR     Status: Abnormal   Collection Time: 10/13/14  7:04 AM  Result Value Ref Range   Prothrombin Time 30.9 (H) 11.6 - 15.2 seconds   INR 2.94 (H) 0.00 - 1.49  APTT     Status: Abnormal    Collection Time: 10/13/14  7:04 AM  Result Value Ref Range   aPTT 45 (H) 24 - 37 seconds    Comment:        IF BASELINE aPTT IS ELEVATED, SUGGEST PATIENT RISK ASSESSMENT BE USED TO DETERMINE APPROPRIATE ANTICOAGULANT THERAPY.   CBC     Status: Abnormal   Collection Time: 10/13/14  7:04 AM  Result Value Ref Range   WBC 6.7 4.0 - 10.5 K/uL   RBC 3.05 (L) 4.22 - 5.81 MIL/uL   Hemoglobin 8.4 (L) 13.0 - 17.0 g/dL   HCT 27.6 (L) 39.0 - 52.0 %   MCV 90.5 78.0 - 100.0 fL   MCH 27.5 26.0 - 34.0 pg   MCHC 30.4 30.0 - 36.0 g/dL   RDW 16.4 (H) 11.5 - 15.5 %   Platelets 313 150 - 400 K/uL  Basic metabolic panel     Status: Abnormal   Collection Time: 10/13/14  7:04 AM  Result Value Ref Range   Sodium 138 135 - 145 mmol/L   Potassium 3.7 3.5 - 5.1 mmol/L   Chloride 110 96 - 112 mmol/L   CO2 25 19 - 32 mmol/L   Glucose, Bld 124 (H) 70 - 99 mg/dL   BUN 20 6 - 23 mg/dL   Creatinine, Ser 0.91 0.50 - 1.35 mg/dL   Calcium 8.7 8.4 - 10.5 mg/dL   GFR calc non Af Amer 82 (L) >90 mL/min   GFR calc Af Amer >90 >90 mL/min    Comment: (NOTE) The eGFR has been calculated using the CKD EPI equation. This calculation has not been validated in all clinical situations. eGFR's persistently <90 mL/min signify possible Chronic Kidney Disease.  Anion gap 3 (L) 5 - 15   No results found.  Review of Systems  All other systems reviewed and are negative.   Blood pressure 144/59, pulse 61, temperature 98.5 F (36.9 C), temperature source Oral, resp. rate 16, height 5' 9"  (1.753 m), weight 91.627 kg (202 lb), SpO2 99 %. Physical Exam  Constitutional: Tanner Pena is oriented to person, place, and time. Tanner Pena appears well-developed and well-nourished.  HENT:  Head: Normocephalic and atraumatic.  Eyes: EOM are normal. Pupils are equal, round, and reactive to light.  Neck: Normal range of motion. Neck supple.  Cardiovascular: Normal rate.   Respiratory: Effort normal and breath sounds normal.  GI: Soft. Bowel  sounds are normal.  Musculoskeletal:       Legs: Neurological: Tanner Pena is alert and oriented to person, place, and time.  Skin: Skin is warm and dry.  Psychiatric: Tanner Pena has a normal mood and affect.     Assessment/Plan Dehiscence of left BKA stump 1)  To the OR today for an I&D of his left BKA stump with wound closure and possible revision amputation.  Talayla Doyel Y 10/13/2014, 8:17 AM

## 2014-10-13 NOTE — Anesthesia Postprocedure Evaluation (Signed)
  Anesthesia Post-op Note  Patient: Tanner Pena  Procedure(s) Performed: Procedure(s) (LRB): REVISION LEFT BELOW KNEE AMPUTATION STUMP (Left)  Patient Location: PACU  Anesthesia Type: General  Level of Consciousness: awake and alert   Airway and Oxygen Therapy: Patient Spontanous Breathing  Post-op Pain: mild  Post-op Assessment: Post-op Vital signs reviewed, Patient's Cardiovascular Status Stable, Respiratory Function Stable, Patent Airway and No signs of Nausea or vomiting  Last Vitals:  Filed Vitals:   10/13/14 1030  BP: 165/72  Pulse: 66  Temp:   Resp: 18    Post-op Vital Signs: stable   Complications: No apparent anesthesia complications

## 2014-10-13 NOTE — Transfer of Care (Signed)
Immediate Anesthesia Transfer of Care Note  Patient: Tanner Pena  Procedure(s) Performed: Procedure(s): REVISION LEFT BELOW KNEE AMPUTATION STUMP (Left)  Patient Location: PACU  Anesthesia Type:General  Level of Consciousness: alert , sedated and patient cooperative  Airway & Oxygen Therapy: Patient Spontanous Breathing and Patient connected to face mask oxygen  Post-op Assessment: Report given to RN and Post -op Vital signs reviewed and stable  Post vital signs: Reviewed and stable  Last Vitals:  Filed Vitals:   10/13/14 0634  BP: 144/59  Pulse:   Temp: 36.9 C  Resp: 16    Complications: No apparent anesthesia complications

## 2014-10-14 ENCOUNTER — Encounter (HOSPITAL_COMMUNITY): Payer: Self-pay | Admitting: Orthopaedic Surgery

## 2014-10-14 LAB — GLUCOSE, CAPILLARY
GLUCOSE-CAPILLARY: 123 mg/dL — AB (ref 70–99)
Glucose-Capillary: 108 mg/dL — ABNORMAL HIGH (ref 70–99)

## 2014-10-14 LAB — PROTIME-INR
INR: 2.76 — ABNORMAL HIGH (ref 0.00–1.49)
Prothrombin Time: 29.4 seconds — ABNORMAL HIGH (ref 11.6–15.2)

## 2014-10-14 MED ORDER — METHOCARBAMOL 500 MG PO TABS: 500.0000 mg | ORAL_TABLET | Freq: Four times a day (QID) | ORAL | Status: DC | PRN

## 2014-10-14 MED ORDER — ACETAMINOPHEN 325 MG PO TABS
650.0000 mg | ORAL_TABLET | Freq: Four times a day (QID) | ORAL | Status: DC | PRN
  Administered 2014-10-14: 650 mg via ORAL
  Filled 2014-10-14: qty 2

## 2014-10-14 NOTE — Progress Notes (Signed)
Subjective: 1 Day Post-Op Procedure(s) (LRB): REVISION LEFT BELOW KNEE AMPUTATION STUMP (Left) Patient reports pain as moderate.  Reports a fever this AM.  Objective: Vital signs in last 24 hours: Temp:  [97.4 F (36.3 C)-102 F (38.9 C)] 99.4 F (37.4 C) (02/26 0530) Pulse Rate:  [57-77] 66 (02/26 0530) Resp:  [11-18] 16 (02/26 0530) BP: (115-168)/(43-94) 133/43 mmHg (02/26 0530) SpO2:  [95 %-100 %] 100 % (02/26 0530)  Intake/Output from previous day: 02/25 0701 - 02/26 0700 In: 2872.9 [P.O.:480; I.V.:2342.9; IV Piggyback:50] Out: 2000 [Urine:1850; Blood:150] Intake/Output this shift: Total I/O In: -  Out: 300 [Urine:300]   Recent Labs  10/13/14 0704  HGB 8.4*    Recent Labs  10/13/14 0704  WBC 6.7  RBC 3.05*  HCT 27.6*  PLT 313    Recent Labs  10/13/14 0704  NA 138  K 3.7  CL 110  CO2 25  BUN 20  CREATININE 0.91  GLUCOSE 124*  CALCIUM 8.7    Recent Labs  10/13/14 0704 10/14/14 0455  INR 2.94* 2.76*    Incision: dressing C/D/I  Assessment/Plan: 1 Day Post-Op Procedure(s) (LRB): REVISION LEFT BELOW KNEE AMPUTATION STUMP (Left) Up with therapy  Non wt. Bearing left leg Plan discharge if remains stable. Encourage incentive spirometry   Erskine Emery 10/14/2014, 7:57 AM

## 2014-10-14 NOTE — Op Note (Signed)
NAMEMarland Kitchen  COLEBY, YETT NO.:  [id number]  MEDICAL RECORD NO.:  [id number]  LOCATION:  2956                         FACILITY:  [facility]  PHYSICIAN:  Lind Guest. Ninfa Pena, M.D.DATE OF BIRTH:  [birth year]  DATE OF PROCEDURE:  10/13/2014 DATE OF DISCHARGE:                              OPERATIVE REPORT   PREOPERATIVE DIAGNOSES:  Left below-knee amputation stump breakdown and wound dehiscence.  POSTOPERATIVE DIAGNOSES:  Left below-knee amputation stump breakdown and wound dehiscence.  PROCEDURE:  Irrigation and debridement of left below-knee amputation stump with revision, amputation, and slight shortening of the below-knee amputation stump and primary closure of the wound.  SURGEON:  Lind Guest. Ninfa Pena, M.D.  ASSISTANT:  Erskine Emery, PA-C  ANESTHESIA:  General.  ANTIBIOTICS:  2 g IV Ancef.  BLOOD LOSS:  150-200 mL.  COMPLICATIONS:  None.  INDICATIONS:  Tanner Pena is a 74 year old gentleman, who was taken to the operating room in early January by another physician and a below- knee amputation was performed secondary to soft tissue necrosis and peripheral vascular disease.  On September 23, 2014, all around the time he was taken back to the OR by my partner due to stump breakdown and had a revision amputation performed.  He then came to the office this week. The lateral aspect of his wound had dehisced again.  There was a large amount of clot.  He is someone who needs to stay therapeutic on his INR above 3 to a heart valve.  With the breakdown of this most recent wound and oozing, I recommended he undergo an irrigation and debridement of the BKA stump with shortening and closure of the wound.  He and his family understand this completely, were not taken off his blood thinners either.  PROCEDURE DESCRIPTION:  After informed consent was obtained, appropriate left leg was marked.  He was brought to the operating room and placed supine on the operating  table.  General anesthesia was then obtained.  A nonsterile tourniquet was placed around his upper left thigh and his left leg was prepped and draped with Betadine scrub and paint.  A time- out was called to identify correct patient and correct left leg.  I then removed the previous sutures that were from medial to central.  We opened up the stump in its entirety.  Using pulsatile lavage, we then had to lavage the end of the stump and soft tissues removed clotted blood.  He was oozing quite a bit from just all around the stump and no distinct bleeders.  Using a rongeur, we removed necrotic soft tissue and muscle and fascia.  Using a #10 blade, we were able to remove necrotic skin.  I then used an oscillating saw to shorten the bone and bevel it only about a cm to a cm and half of the tibia.  We then reapproximated the deep tissue with interrupted 0 Vicryl suture followed by 2-0 Vicryl in subcutaneous tissue and interrupted 2-0 nylon on the skin.  A well-padded stump dressing was then applied.  He was awakened, extubated and taken to recovery room in stable condition.  All final counts were correct.  There were no complications noted.     Tanner Gave  Y. Ninfa Pena, M.D.     CYB/MEDQ  D:  10/13/2014  T:  10/14/2014  Job:  [id number]

## 2014-10-14 NOTE — Discharge Summary (Signed)
Patient ID: Tanner Pena MRN: [id number] DOB/AGE: Oct 19, 1940 74 y.o.  Admit date: 10/13/2014 Discharge date: 10/14/2014  Admission Diagnoses:  Principal Problem:   Dehiscence of operative wound post left below knee amputation Active Problems:   S/P below knee amputation   Discharge Diagnoses:  Same  Past Medical History  Diagnosis Date  . Diabetes mellitus 2007  . Hypertension   . Gout   . Hypercholesteremia   . Peripheral vascular disease     stents lower extremity  . COPD (chronic obstructive pulmonary disease)   . S/P aortic valve replacement 1990    a. St. Jude  . Chronic back pain   . Dysphagia   . Neuromuscular disorder   . Peripheral neuropathy   . Critical lower limb ischemia   . CAD (coronary artery disease)     a. 05/13/14 [name] s/p overlapping DESx2 to SVG to RCA. b.  s/p CABG '90 with redo '94 & stent to RCA SVG in 2005  . Chronic toe ulcer     a. left foot  . Shortness of breath   . Myocardial infarction   . CHF (congestive heart failure)   . Stone in kidney   . GERD (gastroesophageal reflux disease)   . Arthritis   . Sleep apnea     tested greater than 7 years ago per patient    Surgeries: Procedure(s): REVISION LEFT BELOW KNEE AMPUTATION STUMP on 10/13/2014   Consultants:    Discharged Condition: Improved  Hospital Course: Tanner Pena is an 74 y.o. male who was admitted 10/13/2014 for operative treatment ofDehiscence of operative wound. Patient has severe unremitting pain that affects sleep, daily activities, and work/hobbies. After pre-op clearance the patient was taken to the operating room on 10/13/2014 and underwent  Procedure(s): REVISION LEFT BELOW KNEE AMPUTATION STUMP.    Patient was given perioperative antibiotics: Anti-infectives    Start     Dose/Rate Route Frequency Ordered Stop   10/13/14 1500  ceFAZolin (ANCEF) IVPB 1 g/50 mL premix     1 g 100 mL/hr over 30 Minutes Intravenous Every 6 hours 10/13/14 1158 10/14/14 0251    10/13/14 0627  ceFAZolin (ANCEF) IVPB 2 g/50 mL premix     2 g 100 mL/hr over 30 Minutes Intravenous On call to O.R. 10/13/14 8563 10/13/14 0845       Patient was given sequential compression devices, early ambulation, and chemoprophylaxis to prevent DVT.  Patient benefited maximally from hospital stay and there were no complications.    Recent vital signs: Patient Vitals for the past 24 hrs:  BP Temp Temp src Pulse Resp SpO2  10/14/14 0530 (!) 133/43 mmHg 99.4 F (37.4 C) Oral 66 16 100 %  10/14/14 0331 - 100.1 F (37.8 C) - - - -  10/14/14 0157 (!) 144/53 mmHg (!) 102 F (38.9 C) Oral 77 16 95 %  10/13/14 2133 (!) 115/51 mmHg (!) 100.4 F (38 C) Oral 77 16 97 %  10/13/14 1847 (!) 152/47 mmHg - - - - -  10/13/14 1805 (!) 161/46 mmHg 98.4 F (36.9 C) Oral 74 16 96 %  10/13/14 1507 (!) 135/46 mmHg 98.4 F (36.9 C) Oral 62 16 100 %  10/13/14 1403 (!) 119/94 mmHg 98.3 F (36.8 C) Oral 60 16 100 %  10/13/14 1250 (!) 130/51 mmHg 98 F (36.7 C) Oral 70 14 100 %  10/13/14 1148 (!) 144/52 mmHg 97.7 F (36.5 C) - 60 12 100 %  10/13/14 1130 (!) 140/57 mmHg - - (!)  57 12 100 %  10/13/14 1115 (!) 158/54 mmHg 97.4 F (36.3 C) - (!) 57 11 100 %  10/13/14 1100 (!) 163/59 mmHg - - (!) 58 12 100 %  10/13/14 1045 (!) 168/60 mmHg - - 60 14 100 %  10/13/14 1030 (!) 165/72 mmHg - - 66 18 100 %  10/13/14 1022 (!) 159/63 mmHg 97.5 F (36.4 C) - 65 15 100 %     Recent laboratory studies:  Recent Labs  10/13/14 0704 10/14/14 0455  WBC 6.7  --   HGB 8.4*  --   HCT 27.6*  --   PLT 313  --   NA 138  --   K 3.7  --   CL 110  --   CO2 25  --   BUN 20  --   CREATININE 0.91  --   GLUCOSE 124*  --   INR 2.94* 2.76*  CALCIUM 8.7  --      Discharge Medications:     Medication List    TAKE these medications        acetaminophen 500 MG tablet  Commonly known as:  TYLENOL  Take 500 mg by mouth every 6 (six) hours as needed for moderate pain.     albuterol 4 MG 12 hr tablet   Commonly known as:  VOSPIRE ER  Take 4 mg by mouth 2 (two) times daily.     albuterol-ipratropium 18-103 MCG/ACT inhaler  Commonly known as:  COMBIVENT  Inhale 1 puff into the lungs 4 (four) times daily. Coughing/ Shortness of Breath     allopurinol 300 MG tablet  Commonly known as:  ZYLOPRIM  Take 300 mg by mouth at bedtime.     aspirin EC 325 MG tablet  Take 1 tablet (325 mg total) by mouth daily.     cephALEXin 500 MG capsule  Commonly known as:  KEFLEX  Take 1 capsule (500 mg total) by mouth 2 (two) times daily.     cholecalciferol 1000 UNITS tablet  Commonly known as:  VITAMIN D  Take 2,000 Units by mouth daily.     clopidogrel 75 MG tablet  Commonly known as:  PLAVIX  Take 1 tablet (75 mg total) by mouth daily with breakfast.     fenofibrate 145 MG tablet  Commonly known as:  TRICOR  Take 1 tablet (145 mg total) by mouth daily.     fish oil-omega-3 fatty acids 1000 MG capsule  Take 1 capsule (1 g total) by mouth 2 (two) times daily.     furosemide 80 MG tablet  Commonly known as:  LASIX  Take 40 mg by mouth daily.     glimepiride 1 MG tablet  Commonly known as:  AMARYL  Take 1 mg by mouth 2 (two) times daily.     isosorbide mononitrate 30 MG 24 hr tablet  Commonly known as:  IMDUR  Take 1 tablet (30 mg total) by mouth daily. <please make appointment with Dr. [name] for refills>     methocarbamol 500 MG tablet  Commonly known as:  ROBAXIN  Take 1 tablet (500 mg total) by mouth every 6 (six) hours as needed for muscle spasms.     metoprolol succinate 50 MG 24 hr tablet  Commonly known as:  TOPROL-XL  Take 1 tablet (50 mg total) by mouth daily. Take with or immediately following a meal.     nicotine 14 mg/24hr patch  Commonly known as:  NICODERM CQ - dosed in mg/24 hours  Place 1 patch (14 mg total) onto the skin daily.     nitroGLYCERIN 0.4 MG SL tablet  Commonly known as:  NITROSTAT  Place 1 tablet (0.4 mg total) under the tongue every 5 (five) minutes  as needed for chest pain.     oxyCODONE-acetaminophen 5-325 MG per tablet  Commonly known as:  ROXICET  Take 1 tablet by mouth every 4 (four) hours as needed for severe pain.     oxyCODONE-acetaminophen 10-325 MG per tablet  Commonly known as:  PERCOCET  Take 1 tablet by mouth every 4 (four) hours as needed for pain.     potassium chloride SA 20 MEQ tablet  Commonly known as:  K-DUR,KLOR-CON  Take 20 mEq by mouth daily.     pregabalin 50 MG capsule  Commonly known as:  LYRICA  Take one capsule by mouth twice daily for pains     ramipril 5 MG capsule  Commonly known as:  ALTACE  TAKE 1 CAPSULE BY MOUTH EVERY MORNING.     ramipril 2.5 MG capsule  Commonly known as:  ALTACE  TAKE 1 CAPSULE BY MOUTH AT BEDTIME.     rosuvastatin 20 MG tablet  Commonly known as:  CRESTOR  Take 20 mg by mouth at bedtime.     sitaGLIPtin 50 MG tablet  Commonly known as:  JANUVIA  Take 50 mg by mouth daily.     vitamin C with rose hips 1000 MG tablet  Take 1,000 mg by mouth daily.     warfarin 5 MG tablet  Commonly known as:  COUMADIN  Take 1-1.5 tablets (5-7.5 mg total) by mouth at bedtime. Takes one and one-half tablet (7.5mg  total) on Mondays and Fridays. Takes one tablet (5mg  total) on all other days     zinc gluconate 50 MG tablet  Take 50 mg by mouth daily.        Diagnostic Studies: No results found.  Disposition: 01-Home or Self Care      Discharge Instructions    Discharge wound care:    Complete by:  As directed   Keep dressing clean dry and inatct     Elevate operative extremity    Complete by:  As directed      Non weight bearing    Complete by:  As directed   Non weight bearing left leg           Follow-up Information    Follow up with [name], MD. Schedule an appointment as soon as possible for a visit in 2 weeks.   Specialty:  Orthopedic Surgery   Contact information:   [place] [address] [phone]         Signed: Erskine Emery 10/14/2014, 7:56 AM

## 2014-10-14 NOTE — Evaluation (Signed)
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  Clinical Impression  Patient evaluated by Physical Therapy with no further acute PT needs identified. All education has been completed and the patient has no further questions. Pt able to perform transfers bed to/from w/c min/guard for safety.  Pt reports hx of falls with ambulation and agreeable to continue to use w/c for mobility.  He reports family to assist and agreeable to have assist for mobility upon return home (states they may run to store and he would be alone).  Pt was encouraged to elevate L LE and ice upon return home. Recommend follow-up Physical Therapy as per prior to admission as pt s/p L BKA in Jan and then revision earlier this month. PT is signing off. Thank you for this referral.     Follow Up Recommendations  (resume previous PT )    Equipment Recommendations  None recommended by PT    Recommendations for Other Services       Precautions / Restrictions Restrictions Weight Bearing Restrictions: Yes LLE Weight Bearing: Non weight bearing      Mobility  Bed Mobility Overal bed mobility: Needs Assistance Bed Mobility: Supine to Sit;Sit to Supine     Supine to sit: HOB elevated;Supervision Sit to supine: Supervision   General bed mobility comments: increased time  Transfers Overall transfer level: Needs assistance Equipment used: Rolling walker (2 wheeled) Transfers: Sit to/from Stand;Lateral/Scoot Transfers Sit to Stand: Min guard        Lateral/Scoot Transfers: Min guard;From elevated surface General  transfer comment: performed lateral transfer from bed to w/c then stand pivot with RW for w/c back to bed, pt didn't require physical assist, min/guard for safety  Ambulation/Gait                Hotel manager mobility: Yes Wheelchair propulsion: Right lower extremity;Both upper extremities Wheelchair parts: Supervision/cueing Distance: 240 Wheelchair Assistance Details (indicate cue type and reason): occasional cue for setup likely to due to being on pain meds   Modified Rankin (Stroke Patients Only)       Balance Overall balance assessment: History of Falls                                           Pertinent Vitals/Pain Pain Assessment: 0-10 Pain Score: 3  Pain Location: L leg Pain Descriptors / Indicators: Aching Pain Intervention(s): Limited activity within patient's tolerance;Monitored during session;Premedicated before session;Repositioned (enouraged elevation and ice)    Home Living Family/patient expects to be discharged to:: Private residence Living Arrangements: Other relatives Available Help at Discharge: Family;Available PRN/intermittently Type of Home: House Home Access: Stairs to enter;Ramped entrance     Home Layout: One level Home Equipment: Wheelchair - manual      Prior Function Level of Independence: Independent with assistive device(s)         Comments: typically uses w/c as he reports hx of falling on L BKA with attempting ambulation  Hand Dominance        Extremity/Trunk Assessment               Lower Extremity Assessment: LLE deficits/detail   LLE Deficits / Details: s/p L BKA, moves residual limb well just reports pain     Communication   Communication: No difficulties  Cognition Arousal/Alertness: Awake/alert Behavior During Therapy: WFL for tasks assessed/performed Overall Cognitive Status: Within Functional Limits for tasks  assessed                      General Comments      Exercises        Assessment/Plan    PT Assessment All further PT needs can be met in the next venue of care  PT Diagnosis Acute pain;Difficulty walking   PT Problem List Decreased mobility;Decreased balance;Decreased range of motion;Decreased strength  PT Treatment Interventions     PT Goals (Current goals can be found in the Care Plan section) Acute Rehab PT Goals PT Goal Formulation: All assessment and education complete, DC therapy    Frequency     Barriers to discharge        Co-evaluation               End of Session   Activity Tolerance: Patient tolerated treatment well Patient left: in bed;with call bell/phone within reach           Time: 1440-1502 PT Time Calculation (min) (ACUTE ONLY): 22 min   Charges:   PT Evaluation $Initial PT Evaluation Tier I: 1 Procedure     PT G Codes:        Tanner Pena,KATHrine E 10/14/2014, 3:35 PM Carmelia Bake, PT, DPT 10/14/2014 Pager: [phone]

## 2014-10-14 NOTE — Clinical Documentation Improvement (Signed)
Presents with dehiscence of left knee wound; returned to OR for revision and Debridement performed.  Please clarify the type of debridement performed:   Excisional  Non-Incisional  Type of instrumentation used  Depth of debridement  Please document findings in Op Report, Progress Note and include in discharge summary.  Thank You,  Zoila Shutter ,RN Clinical Documentation Specialist:  [facility] [employer] [facility]

## 2014-10-14 NOTE — Discharge Instructions (Signed)
Elevate left leg above heart , keep dressing clean dry and inatact, non weight bearing left leg

## 2014-10-14 NOTE — Progress Notes (Signed)
Utilization review completed.  

## 2014-10-17 ENCOUNTER — Ambulatory Visit (INDEPENDENT_AMBULATORY_CARE_PROVIDER_SITE_OTHER): Payer: Self-pay | Admitting: Pharmacist Clinician (PhC)/ Clinical Pharmacy Specialist

## 2014-10-17 DIAGNOSIS — J449 Chronic obstructive pulmonary disease, unspecified: Secondary | ICD-10-CM | POA: Diagnosis not present

## 2014-10-17 DIAGNOSIS — E119 Type 2 diabetes mellitus without complications: Secondary | ICD-10-CM | POA: Diagnosis not present

## 2014-10-17 DIAGNOSIS — Z89512 Acquired absence of left leg below knee: Secondary | ICD-10-CM | POA: Diagnosis not present

## 2014-10-17 DIAGNOSIS — I2510 Atherosclerotic heart disease of native coronary artery without angina pectoris: Secondary | ICD-10-CM | POA: Diagnosis not present

## 2014-10-17 DIAGNOSIS — Z954 Presence of other heart-valve replacement: Secondary | ICD-10-CM

## 2014-10-17 DIAGNOSIS — Z7901 Long term (current) use of anticoagulants: Secondary | ICD-10-CM

## 2014-10-17 DIAGNOSIS — Z952 Presence of prosthetic heart valve: Secondary | ICD-10-CM

## 2014-10-17 DIAGNOSIS — Z4781 Encounter for orthopedic aftercare following surgical amputation: Secondary | ICD-10-CM | POA: Diagnosis not present

## 2014-10-17 DIAGNOSIS — I10 Essential (primary) hypertension: Secondary | ICD-10-CM | POA: Diagnosis not present

## 2014-10-17 DIAGNOSIS — I739 Peripheral vascular disease, unspecified: Secondary | ICD-10-CM | POA: Diagnosis not present

## 2014-10-17 LAB — POCT INR: INR: 3.3

## 2014-10-20 NOTE — Consult Note (Signed)
NAME:  Tanner Pena, Tanner Pena NO.:  [id number]  MEDICAL RECORD NO.:  [id number]  LOCATION:                                FACILITY:  [facility]  PHYSICIAN:  Lind Guest. Ninfa Linden, M.D.DATE OF BIRTH:  December 04, 1940  DATE OF CONSULTATION:  10/13/2014 DATE OF DISCHARGE:  10/14/2014                                CONSULTATION   ADDENDUM:  It pertains to his recent surgery for revision of a below- knee amputation due to dehiscence of the wound.  He was taken to the operating room on October 13, 2014, where a left below-knee amputation stump revision amputation was performed.  This surgery used an oscillating saw and a #10 blade to cut away necrotic skin, soft tissue, muscle, and bone to shorten up his amputation stump.  This debridement again includes a sharp debridement using a scalpel/#10 knife to, as the operative note mentions, remove necrotic skin, soft tissue, muscle, and bone, which the bone was removed using oscillating saw.  We then were able to do this revision amputation, revised him to a slightly higher level of a below-knee amputation.  Hopefully this note will be useful for helping __________ with determining what was performed in surgery and please refer to the body of the operative note for [name]'s surgery as well.     Lind Guest. Ninfa Linden, M.D.     CYB/MEDQ  D:  10/19/2014  T:  10/20/2014  Job:  [id number]

## 2014-10-21 ENCOUNTER — Telehealth: Payer: Self-pay | Admitting: *Deleted

## 2014-10-21 NOTE — Telephone Encounter (Signed)
Faxed signed PT /INR verbal order to [facility].

## 2014-10-24 ENCOUNTER — Other Ambulatory Visit: Payer: Self-pay | Admitting: Cardiovascular Disease

## 2014-10-24 ENCOUNTER — Ambulatory Visit (INDEPENDENT_AMBULATORY_CARE_PROVIDER_SITE_OTHER): Payer: Medicare Other | Admitting: Pharmacist Clinician (PhC)/ Clinical Pharmacy Specialist

## 2014-10-24 DIAGNOSIS — E119 Type 2 diabetes mellitus without complications: Secondary | ICD-10-CM | POA: Diagnosis not present

## 2014-10-24 DIAGNOSIS — Z952 Presence of prosthetic heart valve: Secondary | ICD-10-CM

## 2014-10-24 DIAGNOSIS — I739 Peripheral vascular disease, unspecified: Secondary | ICD-10-CM | POA: Diagnosis not present

## 2014-10-24 DIAGNOSIS — Z7901 Long term (current) use of anticoagulants: Secondary | ICD-10-CM

## 2014-10-24 DIAGNOSIS — I10 Essential (primary) hypertension: Secondary | ICD-10-CM | POA: Diagnosis not present

## 2014-10-24 DIAGNOSIS — Z4781 Encounter for orthopedic aftercare following surgical amputation: Secondary | ICD-10-CM | POA: Diagnosis not present

## 2014-10-24 DIAGNOSIS — J449 Chronic obstructive pulmonary disease, unspecified: Secondary | ICD-10-CM | POA: Diagnosis not present

## 2014-10-24 DIAGNOSIS — Z954 Presence of other heart-valve replacement: Secondary | ICD-10-CM

## 2014-10-24 DIAGNOSIS — I2510 Atherosclerotic heart disease of native coronary artery without angina pectoris: Secondary | ICD-10-CM | POA: Diagnosis not present

## 2014-10-24 DIAGNOSIS — Z89512 Acquired absence of left leg below knee: Secondary | ICD-10-CM | POA: Diagnosis not present

## 2014-10-24 LAB — POCT INR: INR: 3.9

## 2014-10-25 ENCOUNTER — Telehealth: Payer: Self-pay | Admitting: *Deleted

## 2014-10-25 NOTE — Telephone Encounter (Signed)
Patient has 2 accounts. Opened in error

## 2014-10-25 NOTE — Telephone Encounter (Signed)
Returned PT/ INR order to [facility].

## 2014-10-31 ENCOUNTER — Ambulatory Visit (INDEPENDENT_AMBULATORY_CARE_PROVIDER_SITE_OTHER): Payer: Self-pay | Admitting: Pharmacist Clinician (PhC)/ Clinical Pharmacy Specialist

## 2014-10-31 DIAGNOSIS — Z4781 Encounter for orthopedic aftercare following surgical amputation: Secondary | ICD-10-CM | POA: Diagnosis not present

## 2014-10-31 DIAGNOSIS — Z952 Presence of prosthetic heart valve: Secondary | ICD-10-CM

## 2014-10-31 DIAGNOSIS — Z89512 Acquired absence of left leg below knee: Secondary | ICD-10-CM | POA: Diagnosis not present

## 2014-10-31 DIAGNOSIS — I739 Peripheral vascular disease, unspecified: Secondary | ICD-10-CM | POA: Diagnosis not present

## 2014-10-31 DIAGNOSIS — Z954 Presence of other heart-valve replacement: Secondary | ICD-10-CM

## 2014-10-31 DIAGNOSIS — E119 Type 2 diabetes mellitus without complications: Secondary | ICD-10-CM | POA: Diagnosis not present

## 2014-10-31 DIAGNOSIS — I2510 Atherosclerotic heart disease of native coronary artery without angina pectoris: Secondary | ICD-10-CM | POA: Diagnosis not present

## 2014-10-31 DIAGNOSIS — Z7901 Long term (current) use of anticoagulants: Secondary | ICD-10-CM

## 2014-10-31 DIAGNOSIS — J449 Chronic obstructive pulmonary disease, unspecified: Secondary | ICD-10-CM | POA: Diagnosis not present

## 2014-10-31 DIAGNOSIS — I10 Essential (primary) hypertension: Secondary | ICD-10-CM | POA: Diagnosis not present

## 2014-10-31 LAB — POCT INR: INR: 3.5

## 2014-11-04 DIAGNOSIS — Z4781 Encounter for orthopedic aftercare following surgical amputation: Secondary | ICD-10-CM | POA: Diagnosis not present

## 2014-11-04 DIAGNOSIS — G546 Phantom limb syndrome with pain: Secondary | ICD-10-CM | POA: Diagnosis not present

## 2014-11-04 DIAGNOSIS — G894 Chronic pain syndrome: Secondary | ICD-10-CM | POA: Diagnosis not present

## 2014-11-04 DIAGNOSIS — I739 Peripheral vascular disease, unspecified: Secondary | ICD-10-CM | POA: Diagnosis not present

## 2014-11-04 DIAGNOSIS — E119 Type 2 diabetes mellitus without complications: Secondary | ICD-10-CM | POA: Diagnosis not present

## 2014-11-04 DIAGNOSIS — Z6841 Body Mass Index (BMI) 40.0 and over, adult: Secondary | ICD-10-CM | POA: Diagnosis not present

## 2014-11-04 DIAGNOSIS — J449 Chronic obstructive pulmonary disease, unspecified: Secondary | ICD-10-CM | POA: Diagnosis not present

## 2014-11-04 DIAGNOSIS — Z89512 Acquired absence of left leg below knee: Secondary | ICD-10-CM | POA: Diagnosis not present

## 2014-11-04 DIAGNOSIS — I10 Essential (primary) hypertension: Secondary | ICD-10-CM | POA: Diagnosis not present

## 2014-11-04 DIAGNOSIS — I2510 Atherosclerotic heart disease of native coronary artery without angina pectoris: Secondary | ICD-10-CM | POA: Diagnosis not present

## 2014-11-07 ENCOUNTER — Ambulatory Visit (INDEPENDENT_AMBULATORY_CARE_PROVIDER_SITE_OTHER): Payer: Self-pay | Admitting: Pharmacist Clinician (PhC)/ Clinical Pharmacy Specialist

## 2014-11-07 DIAGNOSIS — J449 Chronic obstructive pulmonary disease, unspecified: Secondary | ICD-10-CM | POA: Diagnosis not present

## 2014-11-07 DIAGNOSIS — I10 Essential (primary) hypertension: Secondary | ICD-10-CM | POA: Diagnosis not present

## 2014-11-07 DIAGNOSIS — Z954 Presence of other heart-valve replacement: Secondary | ICD-10-CM

## 2014-11-07 DIAGNOSIS — I2510 Atherosclerotic heart disease of native coronary artery without angina pectoris: Secondary | ICD-10-CM | POA: Diagnosis not present

## 2014-11-07 DIAGNOSIS — I739 Peripheral vascular disease, unspecified: Secondary | ICD-10-CM | POA: Diagnosis not present

## 2014-11-07 DIAGNOSIS — Z4781 Encounter for orthopedic aftercare following surgical amputation: Secondary | ICD-10-CM | POA: Diagnosis not present

## 2014-11-07 DIAGNOSIS — Z7901 Long term (current) use of anticoagulants: Secondary | ICD-10-CM

## 2014-11-07 DIAGNOSIS — Z952 Presence of prosthetic heart valve: Secondary | ICD-10-CM

## 2014-11-07 DIAGNOSIS — Z89512 Acquired absence of left leg below knee: Secondary | ICD-10-CM | POA: Diagnosis not present

## 2014-11-07 DIAGNOSIS — E119 Type 2 diabetes mellitus without complications: Secondary | ICD-10-CM | POA: Diagnosis not present

## 2014-11-07 DIAGNOSIS — T8781 Dehiscence of amputation stump: Secondary | ICD-10-CM | POA: Diagnosis not present

## 2014-11-07 LAB — POCT INR: INR: 2.5

## 2014-11-12 DIAGNOSIS — I25810 Atherosclerosis of coronary artery bypass graft(s) without angina pectoris: Secondary | ICD-10-CM | POA: Diagnosis not present

## 2014-11-14 DIAGNOSIS — I2510 Atherosclerotic heart disease of native coronary artery without angina pectoris: Secondary | ICD-10-CM | POA: Diagnosis not present

## 2014-11-14 DIAGNOSIS — Z4781 Encounter for orthopedic aftercare following surgical amputation: Secondary | ICD-10-CM | POA: Diagnosis not present

## 2014-11-14 DIAGNOSIS — I739 Peripheral vascular disease, unspecified: Secondary | ICD-10-CM | POA: Diagnosis not present

## 2014-11-14 DIAGNOSIS — T8781 Dehiscence of amputation stump: Secondary | ICD-10-CM | POA: Diagnosis not present

## 2014-11-14 DIAGNOSIS — I10 Essential (primary) hypertension: Secondary | ICD-10-CM | POA: Diagnosis not present

## 2014-11-14 DIAGNOSIS — E119 Type 2 diabetes mellitus without complications: Secondary | ICD-10-CM | POA: Diagnosis not present

## 2014-11-14 DIAGNOSIS — Z89512 Acquired absence of left leg below knee: Secondary | ICD-10-CM | POA: Diagnosis not present

## 2014-11-14 DIAGNOSIS — J449 Chronic obstructive pulmonary disease, unspecified: Secondary | ICD-10-CM | POA: Diagnosis not present

## 2014-11-21 ENCOUNTER — Ambulatory Visit (INDEPENDENT_AMBULATORY_CARE_PROVIDER_SITE_OTHER): Payer: Medicare Other | Admitting: Pharmacist Clinician (PhC)/ Clinical Pharmacy Specialist

## 2014-11-21 ENCOUNTER — Telehealth: Payer: Self-pay | Admitting: *Deleted

## 2014-11-21 DIAGNOSIS — E119 Type 2 diabetes mellitus without complications: Secondary | ICD-10-CM | POA: Diagnosis not present

## 2014-11-21 DIAGNOSIS — Z4781 Encounter for orthopedic aftercare following surgical amputation: Secondary | ICD-10-CM | POA: Diagnosis not present

## 2014-11-21 DIAGNOSIS — Z89512 Acquired absence of left leg below knee: Secondary | ICD-10-CM | POA: Diagnosis not present

## 2014-11-21 DIAGNOSIS — Z952 Presence of prosthetic heart valve: Secondary | ICD-10-CM

## 2014-11-21 DIAGNOSIS — I739 Peripheral vascular disease, unspecified: Secondary | ICD-10-CM | POA: Diagnosis not present

## 2014-11-21 DIAGNOSIS — I2510 Atherosclerotic heart disease of native coronary artery without angina pectoris: Secondary | ICD-10-CM | POA: Diagnosis not present

## 2014-11-21 DIAGNOSIS — I10 Essential (primary) hypertension: Secondary | ICD-10-CM | POA: Diagnosis not present

## 2014-11-21 DIAGNOSIS — J449 Chronic obstructive pulmonary disease, unspecified: Secondary | ICD-10-CM | POA: Diagnosis not present

## 2014-11-21 DIAGNOSIS — Z7901 Long term (current) use of anticoagulants: Secondary | ICD-10-CM

## 2014-11-21 DIAGNOSIS — T8781 Dehiscence of amputation stump: Secondary | ICD-10-CM | POA: Diagnosis not present

## 2014-11-21 DIAGNOSIS — Z954 Presence of other heart-valve replacement: Secondary | ICD-10-CM

## 2014-11-21 LAB — POCT INR: INR: 2.7

## 2014-11-21 NOTE — Telephone Encounter (Signed)
Faxed signed VO per kristen  for patient's INR  To [facility].

## 2014-11-29 DIAGNOSIS — I739 Peripheral vascular disease, unspecified: Secondary | ICD-10-CM | POA: Diagnosis not present

## 2014-11-29 DIAGNOSIS — Z89512 Acquired absence of left leg below knee: Secondary | ICD-10-CM | POA: Diagnosis not present

## 2014-11-29 DIAGNOSIS — Z4781 Encounter for orthopedic aftercare following surgical amputation: Secondary | ICD-10-CM | POA: Diagnosis not present

## 2014-11-29 DIAGNOSIS — I10 Essential (primary) hypertension: Secondary | ICD-10-CM | POA: Diagnosis not present

## 2014-11-29 DIAGNOSIS — T8781 Dehiscence of amputation stump: Secondary | ICD-10-CM | POA: Diagnosis not present

## 2014-11-29 DIAGNOSIS — I2510 Atherosclerotic heart disease of native coronary artery without angina pectoris: Secondary | ICD-10-CM | POA: Diagnosis not present

## 2014-11-29 DIAGNOSIS — E119 Type 2 diabetes mellitus without complications: Secondary | ICD-10-CM | POA: Diagnosis not present

## 2014-11-29 DIAGNOSIS — J449 Chronic obstructive pulmonary disease, unspecified: Secondary | ICD-10-CM | POA: Diagnosis not present

## 2014-11-29 LAB — POCT INR: INR: 3.2

## 2014-11-30 ENCOUNTER — Ambulatory Visit (INDEPENDENT_AMBULATORY_CARE_PROVIDER_SITE_OTHER): Payer: Medicare Other | Admitting: Pharmacist Clinician (PhC)/ Clinical Pharmacy Specialist

## 2014-11-30 DIAGNOSIS — Z954 Presence of other heart-valve replacement: Secondary | ICD-10-CM

## 2014-11-30 DIAGNOSIS — Z7901 Long term (current) use of anticoagulants: Secondary | ICD-10-CM

## 2014-11-30 DIAGNOSIS — Z952 Presence of prosthetic heart valve: Secondary | ICD-10-CM

## 2014-12-09 ENCOUNTER — Telehealth: Payer: Self-pay | Admitting: *Deleted

## 2014-12-09 NOTE — Telephone Encounter (Signed)
Faxed signed PT/INR order to [facility].

## 2014-12-12 ENCOUNTER — Telehealth: Payer: Self-pay | Admitting: Cardiovascular Disease

## 2014-12-12 NOTE — Telephone Encounter (Signed)
Closed encounter °

## 2014-12-13 DIAGNOSIS — G4733 Obstructive sleep apnea (adult) (pediatric): Secondary | ICD-10-CM | POA: Diagnosis not present

## 2014-12-13 DIAGNOSIS — I25810 Atherosclerosis of coronary artery bypass graft(s) without angina pectoris: Secondary | ICD-10-CM | POA: Diagnosis not present

## 2014-12-28 ENCOUNTER — Emergency Department (HOSPITAL_COMMUNITY): Payer: Medicare Other

## 2014-12-28 ENCOUNTER — Encounter (HOSPITAL_COMMUNITY): Payer: Self-pay | Admitting: Emergency Medicine

## 2014-12-28 ENCOUNTER — Inpatient Hospital Stay (HOSPITAL_COMMUNITY)
Admission: EM | Admit: 2014-12-28 | Discharge: 2015-01-01 | DRG: 291 | Disposition: A | Discharge status: Home / Self Care | Payer: Medicare Other | Attending: Internal Medicine | Admitting: Internal Medicine

## 2014-12-28 DIAGNOSIS — I5023 Acute on chronic systolic (congestive) heart failure: Secondary | ICD-10-CM | POA: Diagnosis not present

## 2014-12-28 DIAGNOSIS — I739 Peripheral vascular disease, unspecified: Secondary | ICD-10-CM | POA: Diagnosis not present

## 2014-12-28 DIAGNOSIS — Z955 Presence of coronary angioplasty implant and graft: Secondary | ICD-10-CM

## 2014-12-28 DIAGNOSIS — Z87891 Personal history of nicotine dependence: Secondary | ICD-10-CM | POA: Diagnosis not present

## 2014-12-28 DIAGNOSIS — G4730 Sleep apnea, unspecified: Secondary | ICD-10-CM | POA: Diagnosis not present

## 2014-12-28 DIAGNOSIS — J441 Chronic obstructive pulmonary disease with (acute) exacerbation: Secondary | ICD-10-CM | POA: Diagnosis present

## 2014-12-28 DIAGNOSIS — I5043 Acute on chronic combined systolic (congestive) and diastolic (congestive) heart failure: Principal | ICD-10-CM | POA: Diagnosis present

## 2014-12-28 DIAGNOSIS — Z952 Presence of prosthetic heart valve: Secondary | ICD-10-CM

## 2014-12-28 DIAGNOSIS — I248 Other forms of acute ischemic heart disease: Secondary | ICD-10-CM | POA: Diagnosis present

## 2014-12-28 DIAGNOSIS — Z89512 Acquired absence of left leg below knee: Secondary | ICD-10-CM

## 2014-12-28 DIAGNOSIS — R0682 Tachypnea, not elsewhere classified: Secondary | ICD-10-CM | POA: Diagnosis not present

## 2014-12-28 DIAGNOSIS — E1142 Type 2 diabetes mellitus with diabetic polyneuropathy: Secondary | ICD-10-CM | POA: Diagnosis not present

## 2014-12-28 DIAGNOSIS — E780 Pure hypercholesterolemia: Secondary | ICD-10-CM | POA: Diagnosis present

## 2014-12-28 DIAGNOSIS — I10 Essential (primary) hypertension: Secondary | ICD-10-CM | POA: Diagnosis present

## 2014-12-28 DIAGNOSIS — Z7901 Long term (current) use of anticoagulants: Secondary | ICD-10-CM

## 2014-12-28 DIAGNOSIS — J189 Pneumonia, unspecified organism: Secondary | ICD-10-CM | POA: Diagnosis present

## 2014-12-28 DIAGNOSIS — I6529 Occlusion and stenosis of unspecified carotid artery: Secondary | ICD-10-CM | POA: Diagnosis not present

## 2014-12-28 DIAGNOSIS — Z954 Presence of other heart-valve replacement: Secondary | ICD-10-CM

## 2014-12-28 DIAGNOSIS — R0602 Shortness of breath: Secondary | ICD-10-CM | POA: Diagnosis not present

## 2014-12-28 DIAGNOSIS — R7989 Other specified abnormal findings of blood chemistry: Secondary | ICD-10-CM

## 2014-12-28 DIAGNOSIS — Z95828 Presence of other vascular implants and grafts: Secondary | ICD-10-CM | POA: Diagnosis not present

## 2014-12-28 DIAGNOSIS — E118 Type 2 diabetes mellitus with unspecified complications: Secondary | ICD-10-CM

## 2014-12-28 DIAGNOSIS — K219 Gastro-esophageal reflux disease without esophagitis: Secondary | ICD-10-CM | POA: Diagnosis present

## 2014-12-28 DIAGNOSIS — Z951 Presence of aortocoronary bypass graft: Secondary | ICD-10-CM

## 2014-12-28 DIAGNOSIS — D649 Anemia, unspecified: Secondary | ICD-10-CM | POA: Diagnosis present

## 2014-12-28 DIAGNOSIS — I252 Old myocardial infarction: Secondary | ICD-10-CM

## 2014-12-28 DIAGNOSIS — Z7902 Long term (current) use of antithrombotics/antiplatelets: Secondary | ICD-10-CM

## 2014-12-28 DIAGNOSIS — J9601 Acute respiratory failure with hypoxia: Secondary | ICD-10-CM | POA: Diagnosis not present

## 2014-12-28 DIAGNOSIS — J9621 Acute and chronic respiratory failure with hypoxia: Secondary | ICD-10-CM | POA: Diagnosis not present

## 2014-12-28 DIAGNOSIS — J9600 Acute respiratory failure, unspecified whether with hypoxia or hypercapnia: Secondary | ICD-10-CM | POA: Diagnosis present

## 2014-12-28 DIAGNOSIS — R079 Chest pain, unspecified: Secondary | ICD-10-CM | POA: Diagnosis not present

## 2014-12-28 DIAGNOSIS — M549 Dorsalgia, unspecified: Secondary | ICD-10-CM | POA: Diagnosis present

## 2014-12-28 DIAGNOSIS — I6523 Occlusion and stenosis of bilateral carotid arteries: Secondary | ICD-10-CM

## 2014-12-28 DIAGNOSIS — G8929 Other chronic pain: Secondary | ICD-10-CM | POA: Diagnosis not present

## 2014-12-28 DIAGNOSIS — R0603 Acute respiratory distress: Secondary | ICD-10-CM

## 2014-12-28 DIAGNOSIS — IMO0002 Reserved for concepts with insufficient information to code with codable children: Secondary | ICD-10-CM

## 2014-12-28 DIAGNOSIS — I509 Heart failure, unspecified: Secondary | ICD-10-CM | POA: Diagnosis not present

## 2014-12-28 DIAGNOSIS — I2510 Atherosclerotic heart disease of native coronary artery without angina pectoris: Secondary | ICD-10-CM | POA: Diagnosis present

## 2014-12-28 DIAGNOSIS — E119 Type 2 diabetes mellitus without complications: Secondary | ICD-10-CM

## 2014-12-28 LAB — CBC WITH DIFFERENTIAL/PLATELET
Basophils Absolute: 0.1 10*3/uL (ref 0.0–0.1)
Basophils Relative: 1 % (ref 0–1)
EOS PCT: 5 % (ref 0–5)
Eosinophils Absolute: 0.7 10*3/uL (ref 0.0–0.7)
HCT: 29.9 % — ABNORMAL LOW (ref 39.0–52.0)
Hemoglobin: 8.7 g/dL — ABNORMAL LOW (ref 13.0–17.0)
Lymphocytes Relative: 20 % (ref 12–46)
Lymphs Abs: 2.8 10*3/uL (ref 0.7–4.0)
MCH: 23.1 pg — AB (ref 26.0–34.0)
MCHC: 29.1 g/dL — ABNORMAL LOW (ref 30.0–36.0)
MCV: 79.3 fL (ref 78.0–100.0)
Monocytes Absolute: 1 10*3/uL (ref 0.1–1.0)
Monocytes Relative: 7 % (ref 3–12)
Neutro Abs: 9.7 10*3/uL — ABNORMAL HIGH (ref 1.7–7.7)
Neutrophils Relative %: 68 % (ref 43–77)
PLATELETS: 326 10*3/uL (ref 150–400)
RBC: 3.77 MIL/uL — AB (ref 4.22–5.81)
RDW: 18.7 % — ABNORMAL HIGH (ref 11.5–15.5)
WBC: 14.2 10*3/uL — ABNORMAL HIGH (ref 4.0–10.5)

## 2014-12-28 LAB — I-STAT ARTERIAL BLOOD GAS, ED
ACID-BASE DEFICIT: 1 mmol/L (ref 0.0–2.0)
BICARBONATE: 24.5 meq/L — AB (ref 20.0–24.0)
O2 SAT: 58 %
PO2 ART: 32 mmHg — AB (ref 80.0–100.0)
Patient temperature: 98.6
TCO2: 26 mmol/L (ref 0–100)
pCO2 arterial: 44 mmHg (ref 35.0–45.0)
pH, Arterial: 7.354 (ref 7.350–7.450)

## 2014-12-28 LAB — COMPREHENSIVE METABOLIC PANEL
ALK PHOS: 30 U/L — AB (ref 38–126)
ALT: 13 U/L — ABNORMAL LOW (ref 17–63)
AST: 26 U/L (ref 15–41)
Albumin: 3.5 g/dL (ref 3.5–5.0)
Anion gap: 13 (ref 5–15)
BUN: 25 mg/dL — AB (ref 6–20)
CHLORIDE: 106 mmol/L (ref 101–111)
CO2: 24 mmol/L (ref 22–32)
Calcium: 9.3 mg/dL (ref 8.9–10.3)
Creatinine, Ser: 1.25 mg/dL — ABNORMAL HIGH (ref 0.61–1.24)
GFR calc non Af Amer: 55 mL/min — ABNORMAL LOW (ref >60)
GLUCOSE: 287 mg/dL — AB (ref 70–99)
POTASSIUM: 4.2 mmol/L (ref 3.5–5.1)
SODIUM: 143 mmol/L (ref 135–145)
Total Bilirubin: 0.8 mg/dL (ref 0.3–1.2)
Total Protein: 6.6 g/dL (ref 6.5–8.1)

## 2014-12-28 LAB — TROPONIN I
TROPONIN I: 0.05 ng/mL — AB (ref <0.031)
TROPONIN I: 0.57 ng/mL — AB (ref <0.031)
TROPONIN I: 0.77 ng/mL — AB (ref <0.031)
Troponin I: 0.24 ng/mL — ABNORMAL HIGH (ref <0.031)

## 2014-12-28 LAB — PROTIME-INR
INR: 3.75 — ABNORMAL HIGH (ref 0.00–1.49)
Prothrombin Time: 37.3 seconds — ABNORMAL HIGH (ref 11.6–15.2)

## 2014-12-28 LAB — I-STAT CHEM 8, ED
BUN: 29 mg/dL — AB (ref 6–20)
CALCIUM ION: 1.23 mmol/L (ref 1.13–1.30)
CHLORIDE: 107 mmol/L (ref 101–111)
Creatinine, Ser: 1.2 mg/dL (ref 0.61–1.24)
Glucose, Bld: 282 mg/dL — ABNORMAL HIGH (ref 70–99)
HEMATOCRIT: 31 % — AB (ref 39.0–52.0)
Hemoglobin: 10.5 g/dL — ABNORMAL LOW (ref 13.0–17.0)
POTASSIUM: 4.2 mmol/L (ref 3.5–5.1)
Sodium: 143 mmol/L (ref 135–145)
TCO2: 24 mmol/L (ref 0–100)

## 2014-12-28 LAB — STREP PNEUMONIAE URINARY ANTIGEN: Strep Pneumo Urinary Antigen: NEGATIVE

## 2014-12-28 LAB — CBG MONITORING, ED
GLUCOSE-CAPILLARY: 369 mg/dL — AB (ref 70–99)
GLUCOSE-CAPILLARY: 277 mg/dL — AB (ref 70–99)

## 2014-12-28 LAB — MRSA PCR SCREENING: MRSA by PCR: NEGATIVE

## 2014-12-28 LAB — GLUCOSE, CAPILLARY: Glucose-Capillary: 259 mg/dL — ABNORMAL HIGH (ref 70–99)

## 2014-12-28 LAB — BRAIN NATRIURETIC PEPTIDE: B Natriuretic Peptide: 504.3 pg/mL — ABNORMAL HIGH (ref 0.0–100.0)

## 2014-12-28 LAB — I-STAT TROPONIN, ED: Troponin i, poc: 0.02 ng/mL (ref 0.00–0.08)

## 2014-12-28 LAB — MAGNESIUM: Magnesium: 2 mg/dL (ref 1.7–2.4)

## 2014-12-28 LAB — TSH: TSH: 0.932 u[IU]/mL (ref 0.350–4.500)

## 2014-12-28 MED ORDER — SODIUM CHLORIDE 0.9 % IV SOLN
250.0000 mL | INTRAVENOUS | Status: DC | PRN
  Administered 2014-12-30: 250 mL via INTRAVENOUS

## 2014-12-28 MED ORDER — METOPROLOL SUCCINATE ER 50 MG PO TB24
50.0000 mg | ORAL_TABLET | Freq: Every day | ORAL | Status: DC
  Administered 2014-12-28 – 2015-01-01 (×5): 50 mg via ORAL
  Filled 2014-12-28: qty 1
  Filled 2014-12-28: qty 2
  Filled 2014-12-28 (×3): qty 1

## 2014-12-28 MED ORDER — SODIUM CHLORIDE 0.9 % IJ SOLN: 3.0000 mL | INTRAMUSCULAR | Status: DC | PRN

## 2014-12-28 MED ORDER — FUROSEMIDE 10 MG/ML IJ SOLN
40.0000 mg | Freq: Two times a day (BID) | INTRAMUSCULAR | Status: DC
  Administered 2014-12-28 – 2014-12-30 (×6): 40 mg via INTRAVENOUS
  Filled 2014-12-28 (×8): qty 4

## 2014-12-28 MED ORDER — ONDANSETRON HCL 4 MG/2ML IJ SOLN
4.0000 mg | Freq: Four times a day (QID) | INTRAMUSCULAR | Status: DC | PRN
  Administered 2014-12-28 – 2015-01-01 (×2): 4 mg via INTRAVENOUS
  Filled 2014-12-28 (×2): qty 2

## 2014-12-28 MED ORDER — FUROSEMIDE 10 MG/ML IJ SOLN
40.0000 mg | Freq: Once | INTRAMUSCULAR | Status: AC
  Administered 2014-12-28: 40 mg via INTRAVENOUS
  Filled 2014-12-28: qty 4

## 2014-12-28 MED ORDER — INSULIN ASPART 100 UNIT/ML ~~LOC~~ SOLN
0.0000 [IU] | Freq: Three times a day (TID) | SUBCUTANEOUS | Status: DC
  Administered 2014-12-28: 9 [IU] via SUBCUTANEOUS
  Administered 2014-12-28: 5 [IU] via SUBCUTANEOUS
  Administered 2014-12-29: 2 [IU] via SUBCUTANEOUS
  Administered 2014-12-29 – 2014-12-30 (×3): 3 [IU] via SUBCUTANEOUS
  Administered 2014-12-30: 2 [IU] via SUBCUTANEOUS
  Administered 2014-12-31: 5 [IU] via SUBCUTANEOUS
  Administered 2014-12-31 (×2): 2 [IU] via SUBCUTANEOUS
  Administered 2015-01-01: 5 [IU] via SUBCUTANEOUS
  Filled 2014-12-28 (×2): qty 1

## 2014-12-28 MED ORDER — OXYCODONE-ACETAMINOPHEN 5-325 MG PO TABS
1.0000 | ORAL_TABLET | ORAL | Status: DC | PRN
  Administered 2014-12-29 – 2014-12-31 (×4): 1 via ORAL
  Filled 2014-12-28 (×4): qty 1

## 2014-12-28 MED ORDER — OXYCODONE HCL 5 MG PO TABS
5.0000 mg | ORAL_TABLET | ORAL | Status: DC | PRN
  Administered 2014-12-29 – 2014-12-31 (×4): 5 mg via ORAL
  Filled 2014-12-28 (×4): qty 1

## 2014-12-28 MED ORDER — IPRATROPIUM-ALBUTEROL 0.5-2.5 (3) MG/3ML IN SOLN
3.0000 mL | RESPIRATORY_TRACT | Status: DC
  Administered 2014-12-28 (×2): 3 mL via RESPIRATORY_TRACT
  Filled 2014-12-28 (×2): qty 3

## 2014-12-28 MED ORDER — DEXTROSE 5 % IV SOLN
500.0000 mg | Freq: Once | INTRAVENOUS | Status: DC
  Administered 2014-12-28: 500 mg via INTRAVENOUS
  Filled 2014-12-28: qty 500

## 2014-12-28 MED ORDER — DEXTROSE 5 % IV SOLN
1.0000 g | INTRAVENOUS | Status: DC
  Administered 2014-12-29 – 2015-01-01 (×4): 1 g via INTRAVENOUS
  Filled 2014-12-28 (×4): qty 10

## 2014-12-28 MED ORDER — AZITHROMYCIN 500 MG IV SOLR: 500.0000 mg | INTRAVENOUS | Status: DC

## 2014-12-28 MED ORDER — PREGABALIN 25 MG PO CAPS
50.0000 mg | ORAL_CAPSULE | Freq: Two times a day (BID) | ORAL | Status: DC
  Administered 2014-12-28 – 2015-01-01 (×9): 50 mg via ORAL
  Filled 2014-12-28 (×2): qty 1
  Filled 2014-12-28 (×2): qty 2
  Filled 2014-12-28: qty 1
  Filled 2014-12-28 (×2): qty 2
  Filled 2014-12-28 (×2): qty 1

## 2014-12-28 MED ORDER — OXYCODONE-ACETAMINOPHEN 10-325 MG PO TABS: 1.0000 | ORAL_TABLET | ORAL | Status: DC | PRN

## 2014-12-28 MED ORDER — RAMIPRIL 2.5 MG PO CAPS
2.5000 mg | ORAL_CAPSULE | Freq: Two times a day (BID) | ORAL | Status: DC
  Filled 2014-12-28 (×2): qty 1

## 2014-12-28 MED ORDER — WARFARIN SODIUM 2.5 MG PO TABS
2.5000 mg | ORAL_TABLET | Freq: Once | ORAL | Status: AC
  Administered 2014-12-28: 2.5 mg via ORAL
  Filled 2014-12-28 (×2): qty 1

## 2014-12-28 MED ORDER — CLOPIDOGREL BISULFATE 75 MG PO TABS
75.0000 mg | ORAL_TABLET | Freq: Every day | ORAL | Status: DC
  Administered 2014-12-28 – 2015-01-01 (×5): 75 mg via ORAL
  Filled 2014-12-28 (×6): qty 1

## 2014-12-28 MED ORDER — WARFARIN - PHARMACIST DOSING INPATIENT
Freq: Every day | Status: DC
  Administered 2014-12-30: 18:00:00

## 2014-12-28 MED ORDER — ACETAMINOPHEN 325 MG PO TABS: 650.0000 mg | ORAL_TABLET | ORAL | Status: DC | PRN

## 2014-12-28 MED ORDER — CETYLPYRIDINIUM CHLORIDE 0.05 % MT LIQD
7.0000 mL | Freq: Two times a day (BID) | OROMUCOSAL | Status: DC
  Administered 2014-12-28 – 2014-12-31 (×6): 7 mL via OROMUCOSAL

## 2014-12-28 MED ORDER — IPRATROPIUM-ALBUTEROL 0.5-2.5 (3) MG/3ML IN SOLN
3.0000 mL | RESPIRATORY_TRACT | Status: DC | PRN
Start: 2014-12-28 — End: 2014-12-29

## 2014-12-28 MED ORDER — INSULIN ASPART 100 UNIT/ML ~~LOC~~ SOLN
6.0000 [IU] | Freq: Once | SUBCUTANEOUS | Status: AC
  Administered 2014-12-28: 6 [IU] via SUBCUTANEOUS

## 2014-12-28 MED ORDER — SODIUM CHLORIDE 0.9 % IJ SOLN
3.0000 mL | Freq: Two times a day (BID) | INTRAMUSCULAR | Status: DC
  Administered 2014-12-28 – 2014-12-31 (×8): 3 mL via INTRAVENOUS
  Filled 2014-12-28: qty 3

## 2014-12-28 MED ORDER — ISOSORBIDE MONONITRATE ER 30 MG PO TB24
30.0000 mg | ORAL_TABLET | Freq: Every day | ORAL | Status: DC
Start: 2014-12-28 — End: 2015-01-01
  Administered 2014-12-28 – 2015-01-01 (×5): 30 mg via ORAL
  Filled 2014-12-28 (×5): qty 1

## 2014-12-28 MED ORDER — ALUM & MAG HYDROXIDE-SIMETH 200-200-20 MG/5ML PO SUSP
30.0000 mL | ORAL | Status: DC | PRN
  Administered 2014-12-28: 30 mL via ORAL
  Filled 2014-12-28: qty 30

## 2014-12-28 MED ORDER — ROSUVASTATIN CALCIUM 20 MG PO TABS
20.0000 mg | ORAL_TABLET | Freq: Every day | ORAL | Status: DC
  Administered 2014-12-28 – 2014-12-31 (×4): 20 mg via ORAL
  Filled 2014-12-28 (×6): qty 1

## 2014-12-28 MED ORDER — ALLOPURINOL 300 MG PO TABS
300.0000 mg | ORAL_TABLET | Freq: Every day | ORAL | Status: DC
  Administered 2014-12-28 – 2014-12-31 (×4): 300 mg via ORAL
  Filled 2014-12-28 (×5): qty 1

## 2014-12-28 MED ORDER — DEXTROSE 5 % IV SOLN
1.0000 g | Freq: Once | INTRAVENOUS | Status: AC
  Administered 2014-12-28: 1 g via INTRAVENOUS
  Filled 2014-12-28: qty 10

## 2014-12-28 NOTE — ED Notes (Signed)
Verbal order to hold daily lisinopril per admitting MD.

## 2014-12-28 NOTE — Progress Notes (Signed)
[facility] PROGRESS NOTE  Tanner Pena ZGY:[id number] DOB: [birth year] DOA: 12/28/2014 PCP: [name], MD  Assessment/Plan: 1-Acute Respiratory Failure: Multifactorial, Heart Failure exacerbation, COPD.  Continue with nebulizer treatments, ceftriaxone. Schedule lasix IV BID.   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 Lasix 40 mg IV bid.  Cardiology consulted.   3-History of Aortic Valve replacement with mechanical Valve:  Coumadin per pharmacy.   3-Diabetes: SSI.   4-elevated troponin; cardiology consulted. Check echo./ on Imdur, Plavix. coumadin     Code Status: Full Code.  Family Communication: care discussed with family at bedside.  Disposition Plan: Step down unit appropriate.    Consultants:  Cardiology  Procedures:  none  Antibiotics:  Ceftriaxone  HPI/Subjective: Chest pain free. Breathing better.  Had chest pain 2 night ago. Wake up last night with dyspnea and chest pain.  Only missed one dose of his lasix last week when he went to Dr.   Loletta Specter: Filed Vitals:   12/28/14 0730  BP: 130/58  Pulse: 83  Resp: 19    Intake/Output Summary (Last 24 hours) at 12/28/14 0742 Last data filed at 12/28/14 0545  Gross per 24 hour  Intake      0 ml  Output    600 ml  Net   -600 ml   Filed Weights   12/28/14 0501  Weight: 95.89 kg (211 lb 6.4 oz)    Exam:   General:  Alert in no distress.   Cardiovascular: S 1, S 2 RRR  Respiratory: Crackles bilaterally  Abdomen: BS present, soft, nt  Musculoskeletal: BKA left LE, right lower extremity with plus 3 edema.   Data Reviewed: Basic Metabolic Panel:  Recent Labs Lab 12/28/14 0134 12/28/14 0158  NA 143 143  K 4.2 4.2  CL 106 107  CO2 24  --   GLUCOSE 287* 282*  BUN 25* 29*  CREATININE 1.25* 1.20  CALCIUM 9.3  --    Liver Function Tests:  Recent Labs Lab 12/28/14 0134  AST 26  ALT 13*  ALKPHOS 30*  BILITOT 0.8  PROT 6.6  ALBUMIN 3.5    No results for input(s): LIPASE, AMYLASE in the last 168 hours. No results for input(s): AMMONIA in the last 168 hours. CBC:  Recent Labs Lab 12/28/14 0134 12/28/14 0158  WBC 14.2*  --   NEUTROABS 9.7*  --   HGB 8.7* 10.5*  HCT 29.9* 31.0*  MCV 79.3  --   PLT 326  --    Cardiac Enzymes:  Recent Labs Lab 12/28/14 0134  TROPONINI 0.05*   BNP (last 3 results)  Recent Labs  12/28/14 0134  BNP 504.3*    ProBNP (last 3 results)  Recent Labs  01/19/14 2042 05/10/14 0229 05/11/14 1530  PROBNP 1373.0* 820.3* 1302.0*    CBG: No results for input(s): GLUCAP in the last 168 hours.  No results found for this or any previous visit (from the past 240 hour(s)).   Studies: Dg Chest Port 1 View  12/28/2014   CLINICAL DATA:  Mid chest pain and shortness of breath tonight.  EXAM: PORTABLE CHEST - 1 VIEW  COMPARISON:  05/12/2014  FINDINGS: Postoperative changes in the mediastinum. Interval removal of endotracheal and enteric tubes. Cardiac enlargement with mild pulmonary vascular congestion. Increasing airspace disease in the lung bases may be due to edema or pneumonia. No definite blunting of costophrenic angles. No pneumothorax. Old resection or resorption of the distal right clavicle. Calcification of the aorta.  IMPRESSION: Cardiac enlargement  with mild pulmonary vascular congestion. Increasing airspace disease in both lung bases since previous study.   Electronically Signed   By: Lucienne Capers M.D.   On: 12/28/2014 02:06    Scheduled Meds: . allopurinol  300 mg Oral QHS  . clopidogrel  75 mg Oral Q breakfast  . ipratropium-albuterol  3 mL Nebulization Q4H  . isosorbide mononitrate  30 mg Oral Daily  . metoprolol succinate  50 mg Oral Daily  . pregabalin  50 mg Oral BID  . ramipril  2.5 mg Oral BID  . rosuvastatin  20 mg Oral QHS  . sodium chloride  3 mL Intravenous Q12H  . Warfarin - Pharmacist Dosing Inpatient   Does not apply q1800   Continuous Infusions: .  sodium chloride    . cefTRIAXone (ROCEPHIN)  IV      Principal Problem:   Acute respiratory failure Active Problems:   H/O aortic valve replacement- [employer]   CAD - CABG '90 with re do '94    DM2 (diabetes mellitus, type 2)   Carotid stenosis   Acute on chronic combined systolic and diastolic CHF   Below knee amputation status   CAP (community acquired pneumonia)   COPD with exacerbation    Time spent: 35 minutes.     Niel Hummer A  [facility] Pager [phone]. If 7PM-7AM, please contact night-coverage at [email or web address], password [facility] 12/28/2014, 7:42 AM  LOS: 0 days

## 2014-12-28 NOTE — ED Notes (Signed)
Ordered pt break tray. Heart healthy.

## 2014-12-28 NOTE — Progress Notes (Addendum)
Took pt off of BiPAP at this time and placed pt on 2L nasal cannula. Pt tolerating well at this time and has sats of 97% or greater. RT will continue to monitor.   RT stuck pt for ABG and results showed mixed venous, RN and MD Posey Pronto notified

## 2014-12-28 NOTE — ED Notes (Signed)
Hospitalist called and told nursing to call cards and inform them about delta troponin being more elevated than first. Hospitalist believes elevated troponin is rt mild cardiac ischemia.

## 2014-12-28 NOTE — Progress Notes (Signed)
ANTICOAGULATION CONSULT NOTE - Initial Consult  Pharmacy Consult for Coumadin Indication: AVR  No Known Allergies  Patient Measurements: Weight: 211 lb 6.4 oz (95.89 kg)  Vital Signs: BP: 148/74 mmHg (05/11 0515) Pulse Rate: 83 (05/11 0515)  Labs:  Recent Labs  12/28/14 0134 12/28/14 0158  HGB 8.7* 10.5*  HCT 29.9* 31.0*  PLT 326  --   LABPROT 37.3*  --   INR 3.75*  --   CREATININE 1.25* 1.20  TROPONINI 0.05*  --     Estimated Creatinine Clearance: 61.7 mL/min (by C-G formula based on Cr of 1.2).   Medical History: Past Medical History  Diagnosis Date  . Diabetes mellitus 2007  . Hypertension   . Gout   . Hypercholesteremia   . Peripheral vascular disease     stents lower extremity  . COPD (chronic obstructive pulmonary disease)   . S/P aortic valve replacement 1990    a. [employer]  . Chronic back pain   . Dysphagia   . Neuromuscular disorder   . Peripheral neuropathy   . Critical lower limb ischemia   . CAD (coronary artery disease)     a. 05/13/14 [name] s/p overlapping DESx2 to SVG to RCA. b.  s/p CABG '90 with redo '94 & stent to RCA SVG in 2005  . Chronic toe ulcer     a. left foot  . Shortness of breath   . Myocardial infarction   . CHF (congestive heart failure)   . Stone in kidney   . GERD (gastroesophageal reflux disease)   . Arthritis   . Sleep apnea     tested greater than 7 years ago per patient     Assessment: 74yo male c/o CP and SOB, rales/rhonchii on exam, admitted for further eval of respiratory distress, to continue Coumadin for AVR; current INR slightly supratherapeutic w/ last dose of Coumadin taken 5/10 PTA.  Goal of Therapy:  INR 2.5-3.5   Plan:  Will give smaller dose of Coumadin 2.'5mg'$  po x1 today and adjust doses per INR.  Wynona Neat, PharmD, BCPS  12/28/2014,5:39 AM

## 2014-12-28 NOTE — ED Provider Notes (Signed)
CSN: [id number]     Arrival date & time 12/28/14  0120 History   First MD Initiated Contact with Patient 12/28/14 0125     Chief Complaint  Patient presents with  . Shortness of Breath  . Chest Pain     (Consider location/radiation/quality/duration/timing/severity/associated sxs/prior Treatment) HPI 74 year old male presents to emergency department from home via EMS with complaint of respiratory distress.  Over the last 2-3 days he has had worsening shortness of breath, cough, wheezing.  Patient has history of COPD and CHF.  Patient received 2 albuterol nebs.  In route.  Initial one helped, second one.  He reports he felt worse.  He should placed on Cipro Briefly with EMS, but did not tolerate.  Patient diaphoretic, in respiratory distress upon arrival.  Able to speak in 2 word sentences.  He denies any chest pain. Past Medical History  Diagnosis Date  . Diabetes mellitus 2007  . Hypertension   . Gout   . Hypercholesteremia   . Peripheral vascular disease     stents lower extremity  . COPD (chronic obstructive pulmonary disease)   . S/P aortic valve replacement 1990    a. [employer]  . Chronic back pain   . Dysphagia   . Neuromuscular disorder   . Peripheral neuropathy   . Critical lower limb ischemia   . CAD (coronary artery disease)     a. 05/13/14 [name] s/p overlapping DESx2 to SVG to RCA. b.  s/p CABG '90 with redo '94 & stent to RCA SVG in 2005  . Chronic toe ulcer     a. left foot  . Shortness of breath   . Myocardial infarction   . CHF (congestive heart failure)   . Stone in kidney   . GERD (gastroesophageal reflux disease)   . Arthritis   . Sleep apnea     tested greater than 7 years ago per patient   Past Surgical History  Procedure Laterality Date  . Open heart surgery  1990    prosthetic heart valve, one bypass  . Rotator cuff repair      right  . Cataract extraction      bilateral  . Coronary stent placement  2005    RCA vein graft A 3.0x13.0 TAXUS stent was  then placed int he vessel a Viva 3.0x4.0 (perfusion balloon was made ready it was placed through the entire lenght of the stent  . Peripheral vascular procedures lower extremities      right external iliac  artery PTA and stenting as well as bilateral SFA intervention remotely. Repeat procedures in 2011 bilaterally  . Coronary artery bypass graft  1994    6 vessels  . [name]  06/13/2011    Procedure: Venia Minks DILATION;  Surgeon: [name], MD;  Location: [facility];  Service: Endoscopy;  Laterality: N/A;  Dilated to 56.   . Angioplasty illiac artery    . Back surgery  [id number]    2  . Eye surgery    . Amputation Left 07/13/2014    Procedure: Transmetatarsal Amputation;  Surgeon: [name], MD;  Location: [place];  Service: Orthopedics;  Laterality: Left;  . Lower extremity angiogram N/A 02/15/2013    Procedure: LOWER EXTREMITY ANGIOGRAM;  Surgeon: [name], MD;  Location: [facility] CATH LAB;  Service: Cardiovascular;  Laterality: N/A;  . Left heart catheterization with coronary angiogram N/A 05/11/2014    Procedure: LEFT HEART CATHETERIZATION WITH CORONARY ANGIOGRAM;  Surgeon: [name], MD;  Location: [place] CATH LAB;  Service: Cardiovascular;  Laterality: N/A;  . Percutaneous coronary stent intervention (pci-s) N/A 05/13/2014    Procedure: PERCUTANEOUS CORONARY STENT INTERVENTION (PCI-S);  Surgeon: [name], MD;  Location: [facility] CATH LAB;  Service: Cardiovascular;  Laterality: N/A;  . Lower extremity angiogram N/A 06/06/2014    Procedure: LOWER EXTREMITY ANGIOGRAM;  Surgeon: [name], MD;  Location: [facility] CATH LAB;  Service: Cardiovascular;  Laterality: N/A;  . Amputation Left 08/20/2014    Procedure: Revision Transmetatarsal Amputation versus Below Knee Amputation;  Surgeon: [name], MD;  Location: [place];  Service: Orthopedics;  Laterality: Left;  . Stump revision Left 09/23/2014    Procedure: Revision Left Below Knee Amputation;  Surgeon: [name],  MD;  Location: [place];  Service: Orthopedics;  Laterality: Left;  . Stump revision Left 10/13/2014    Procedure: REVISION LEFT BELOW KNEE AMPUTATION STUMP;  Surgeon: [name], MD;  Location: [facility];  Service: Orthopedics;  Laterality: Left;   Family History  Problem Relation Age of Onset  . Colon cancer Neg Hx   . Liver disease Neg Hx    History  Substance Use Topics  . Smoking status: Former Smoker -- 1.00 packs/day for 55 years    Types: Cigarettes    Start date: 08/19/1953    Quit date: 05/07/2014  . Smokeless tobacco: Current User    Types: Chew    Last Attempt to Quit: 08/20/1995     Comment: Has quit on 3 occasions. Counseling given today 5-10 minutes   I am more than likely going to quit "  . Alcohol Use: No     Comment: socially, sometimes 12 ounce beer daily, may go month without/no whiskey    Review of Systems Level V caveat, respiratory distress   Allergies  Review of patient's allergies indicates no known allergies.  Home Medications   Prior to Admission medications   Medication Sig Start Date End Date Taking? Authorizing Provider  albuterol (VOSPIRE ER) 4 MG 12 hr tablet Take 4 mg by mouth 2 (two) times daily.    Yes Historical Provider, MD  albuterol-ipratropium (COMBIVENT) 18-103 MCG/ACT inhaler Inhale 1 puff into the lungs 4 (four) times daily. Coughing/ Shortness of Breath   Yes Historical Provider, MD  allopurinol (ZYLOPRIM) 300 MG tablet Take 300 mg by mouth at bedtime.    Yes Historical Provider, MD  Ascorbic Acid (VITAMIN C WITH ROSE HIPS) 1000 MG tablet Take 1,000 mg by mouth daily.   Yes Historical Provider, MD  cholecalciferol (VITAMIN D) 1000 UNITS tablet Take 2,000 Units by mouth daily.   Yes Historical Provider, MD  clopidogrel (PLAVIX) 75 MG tablet Take 1 tablet (75 mg total) by mouth daily with breakfast. 05/17/14  Yes Eileen Stanford, PA-C  fenofibrate (TRICOR) 145 MG tablet Take 1 tablet (145 mg total) by mouth daily. 02/24/14  Yes  Troy Sine, MD  fish oil-omega-3 fatty acids 1000 MG capsule Take 1 capsule (1 g total) by mouth 2 (two) times daily. 01/22/13  Yes Kristin L Alvstad, RPH-CPP  furosemide (LASIX) 80 MG tablet Take 40 mg by mouth daily.   Yes Historical Provider, MD  glimepiride (AMARYL) 1 MG tablet Take 1 mg by mouth 2 (two) times daily.   Yes Historical Provider, MD  isosorbide mononitrate (IMDUR) 30 MG 24 hr tablet TAKE ONE TABLET BY MOUTH ONCE DAILY. 10/25/14  Yes Troy Sine, MD  metoprolol succinate (TOPROL-XL) 50 MG 24 hr tablet Take  1 tablet (50 mg total) by mouth daily. Take with or immediately following a meal. 05/17/14  Yes Eileen Stanford, PA-C  nitroGLYCERIN (NITROSTAT) 0.4 MG SL tablet Place 1 tablet (0.4 mg total) under the tongue every 5 (five) minutes as needed for chest pain. 05/17/14  Yes Eileen Stanford, PA-C  oxyCODONE-acetaminophen (PERCOCET) 10-325 MG per tablet Take 1 tablet by mouth every 4 (four) hours as needed for pain. 09/23/14  Yes [name], MD  potassium chloride SA (K-DUR,KLOR-CON) 20 MEQ tablet Take 20 mEq by mouth daily.     Yes Historical Provider, MD  pregabalin (LYRICA) 50 MG capsule Take one capsule by mouth twice daily for pains 08/23/14  Yes Mahima Pandey, MD  ramipril (ALTACE) 2.5 MG capsule TAKE 1 CAPSULE BY MOUTH AT BEDTIME. 09/27/14  Yes [name], MD  ramipril (ALTACE) 5 MG capsule TAKE 1 CAPSULE BY MOUTH EVERY MORNING. 09/27/14  Yes [name], MD  rosuvastatin (CRESTOR) 20 MG tablet Take 20 mg by mouth at bedtime.   Yes Historical Provider, MD  sitaGLIPtin (JANUVIA) 50 MG tablet Take 50 mg by mouth daily.   Yes Historical Provider, MD  warfarin (COUMADIN) 5 MG tablet Take 1-1.5 tablets (5-7.5 mg total) by mouth at bedtime. Takes one and one-half tablet (7.'5mg'$  total) on Mondays and Fridays. Takes one tablet ('5mg'$  total) on all other days Patient taking differently: Take 5-7.5 mg by mouth at bedtime. Takes one-half tablet (7.'5mg'$  total) on Fridays. Takes one  tablet ('5mg'$  total) on all other days 05/17/14  Yes Eileen Stanford, PA-C  zinc gluconate 50 MG tablet Take 50 mg by mouth daily.   Yes Historical Provider, MD  acetaminophen (TYLENOL) 500 MG tablet Take 500 mg by mouth every 6 (six) hours as needed for moderate pain.    Historical Provider, MD  aspirin EC 325 MG tablet Take 1 tablet (325 mg total) by mouth daily. Patient not taking: Reported on 10/13/2014 08/22/14   [name], MD  cephALEXin (KEFLEX) 500 MG capsule Take 1 capsule (500 mg total) by mouth 2 (two) times daily. Patient not taking: Reported on 08/20/2014 06/22/14   [name], MD  methocarbamol (ROBAXIN) 500 MG tablet Take 1 tablet (500 mg total) by mouth every 6 (six) hours as needed for muscle spasms. Patient not taking: Reported on 12/28/2014 10/14/14   Pete Pelt, PA-C  nicotine (NICODERM CQ - DOSED IN MG/24 HOURS) 14 mg/24hr patch Place 1 patch (14 mg total) onto the skin daily. Patient not taking: Reported on 08/20/2014 05/17/14   Eileen Stanford, PA-C  oxyCODONE-acetaminophen (ROXICET) 5-325 MG per tablet Take 1 tablet by mouth every 4 (four) hours as needed for severe pain. Patient not taking: Reported on 09/23/2014 07/13/14   [name], MD   BP 138/58 mmHg  Pulse 83  Resp 19  SpO2 100% Physical Exam  Constitutional: He is oriented to person, place, and time. He appears well-developed and well-nourished. He appears distressed.  HENT:  Head: Normocephalic and atraumatic.  Nose: Nose normal.  Mouth/Throat: Oropharynx is clear and moist.  Eyes: Conjunctivae and EOM are normal. Pupils are equal, round, and reactive to light.  Neck: Normal range of motion. Neck supple. No JVD present. No tracheal deviation present. No thyromegaly present.  Cardiovascular: Normal heart sounds and intact distal pulses.  Exam reveals no gallop and no friction rub.   No murmur heard. Tachycardia, with frequent extra beats  Pulmonary/Chest: No stridor. He is in respiratory  distress.  He has wheezes. He has rales. He exhibits no tenderness.  rhonchi  Abdominal: Soft. Bowel sounds are normal. He exhibits no distension and no mass. There is no tenderness. There is no rebound and no guarding.  Umbilical hernia noted, reducible.  Musculoskeletal: Normal range of motion. He exhibits no edema or tenderness.  Lymphadenopathy:    He has no cervical adenopathy.  Neurological: He is alert and oriented to person, place, and time. He displays normal reflexes. He exhibits normal muscle tone. Coordination normal.  Skin: Skin is warm. No rash noted. He is diaphoretic. No erythema. No pallor.  Psychiatric: He has a normal mood and affect. His behavior is normal. Judgment and thought content normal.  Nursing note and vitals reviewed.   ED Course  Procedures (including critical care time) Labs Review Labs Reviewed  TROPONIN I - Abnormal; Notable for the following:    Troponin I 0.05 (*)    All other components within normal limits  COMPREHENSIVE METABOLIC PANEL - Abnormal; Notable for the following:    Glucose, Bld 287 (*)    BUN 25 (*)    Creatinine, Ser 1.25 (*)    ALT 13 (*)    Alkaline Phosphatase 30 (*)    GFR calc non Af Amer 55 (*)    All other components within normal limits  BRAIN NATRIURETIC PEPTIDE - Abnormal; Notable for the following:    B Natriuretic Peptide 504.3 (*)    All other components within normal limits  CBC WITH DIFFERENTIAL/PLATELET - Abnormal; Notable for the following:    WBC 14.2 (*)    RBC 3.77 (*)    Hemoglobin 8.7 (*)    HCT 29.9 (*)    MCH 23.1 (*)    MCHC 29.1 (*)    RDW 18.7 (*)    Neutro Abs 9.7 (*)    All other components within normal limits  PROTIME-INR - Abnormal; Notable for the following:    Prothrombin Time 37.3 (*)    INR 3.75 (*)    All other components within normal limits  I-STAT CHEM 8, ED - Abnormal; Notable for the following:    BUN 29 (*)    Glucose, Bld 282 (*)    Hemoglobin 10.5 (*)    HCT 31.0 (*)    All other  components within normal limits  Randolm Idol, ED    Imaging Review Dg Chest Port 1 View  12/28/2014   CLINICAL DATA:  Mid chest pain and shortness of breath tonight.  EXAM: PORTABLE CHEST - 1 VIEW  COMPARISON:  05/12/2014  FINDINGS: Postoperative changes in the mediastinum. Interval removal of endotracheal and enteric tubes. Cardiac enlargement with mild pulmonary vascular congestion. Increasing airspace disease in the lung bases may be due to edema or pneumonia. No definite blunting of costophrenic angles. No pneumothorax. Old resection or resorption of the distal right clavicle. Calcification of the aorta.  IMPRESSION: Cardiac enlargement with mild pulmonary vascular congestion. Increasing airspace disease in both lung bases since previous study.   Electronically Signed   By: Lucienne Capers M.D.   On: 12/28/2014 02:06     EKG Interpretation   Date/Time:  Wednesday Dec 28 2014 01:27:40 EDT Ventricular Rate:  103 PR Interval:  144 QRS Duration: 165 QT Interval:  429 QTC Calculation: 562 R Axis:   62 Text Interpretation:  Sinus or ectopic atrial tachycardia Ventricular  premature complex LVH with secondary repolarization abnormality Prolonged  QT interval rate has increased from prior with continued frequent PVCs  Confirmed by Haziel Molner  MD, Niv Darley ([id number]) on 12/28/2014 1:30:16 AM     CRITICAL CARE Performed by: Kalman Drape Total critical care time: 30 min Critical care time was exclusive of separately billable procedures and treating other patients. Critical care was necessary to treat or prevent imminent or life-threatening deterioration. Critical care was time spent personally by me on the following activities: development of treatment plan with patient and/or surrogate as well as nursing, discussions with consultants, evaluation of patient's response to treatment, examination of patient, obtaining history from patient or surrogate, ordering and performing treatments and  interventions, ordering and review of laboratory studies, ordering and review of radiographic studies, pulse oximetry and re-evaluation of patient's condition. MDM   Final diagnoses:  SOB (shortness of breath)  Respiratory distress  COPD exacerbation  CAP (community acquired pneumonia)    74 year old male who presents with respiratory distress.  He is much improved after being on BiPAP.  Chest x-ray with concerning airspace disease, rhonchi on exam.  Differential includes CHF versus infectious.  Elevated white blood cell count noted, concern for infection.  Will cover with antibiotics.  Case discussed with hospitalist.  Patient needs weight documented in the chart.  Patient to be admitted    Linton Flemings, MD 12/28/14 [phone]

## 2014-12-28 NOTE — Consult Note (Signed)
CARDIOLOGY CONSULT NOTE   Patient ID: Tanner Pena: [id number], DOB/AGE: 03-27-1941   Admit date: 12/28/2014 Date of Consult: 12/28/2014   Primary Physician: Tanner Pena., MD Primary Cardiologist: Dr Tanner Pena  Pt. Profile  74 year old man with CAD and prosthetic aortic valve and severe peripheral arterial occlusive disease who is admitted with increasing dyspnea.  Chest x-ray suggests both congestive heart failure and possible coexisting pneumonitis.  Problem List  Past Medical History  Diagnosis Date  . Diabetes mellitus 2007  . Hypertension   . Gout   . Hypercholesteremia   . Peripheral vascular disease     stents lower extremity  . COPD (chronic obstructive pulmonary disease)   . S/P aortic valve replacement 1990    a. St. Jude  . Chronic back pain   . Dysphagia   . Neuromuscular disorder   . Peripheral neuropathy   . Critical lower limb ischemia   . CAD (coronary artery disease)     a. 05/13/14 [name] s/p overlapping DESx2 to SVG to RCA. b.  s/p CABG '90 with redo '94 & stent to RCA SVG in 2005  . Chronic toe ulcer     a. left foot  . Shortness of breath   . Myocardial infarction   . CHF (congestive heart failure)   . Stone in kidney   . GERD (gastroesophageal reflux disease)   . Arthritis   . Sleep apnea     tested greater than 7 years ago per patient    Past Surgical History  Procedure Laterality Date  . Open heart surgery  1990    prosthetic heart valve, one bypass  . Rotator cuff repair      right  . Cataract extraction      bilateral  . Coronary stent placement  2005    RCA vein graft A 3.0x13.0 TAXUS stent was then placed int he vessel a Viva 3.0x4.0 (perfusion balloon was made ready it was placed through the entire lenght of the stent  . Peripheral vascular procedures lower extremities      right external iliac  artery PTA and stenting as well as bilateral SFA intervention remotely. Repeat procedures in 2011 bilaterally  . Coronary  artery bypass graft  1994    6 vessels  . [name]  06/13/2011    Procedure: Venia Minks DILATION;  Surgeon: Tanner Dolin, MD;  Location: [facility];  Service: Endoscopy;  Laterality: N/A;  Dilated to 56.   . Angioplasty illiac artery    . Back surgery  [id number]    2  . Eye surgery    . Amputation Left 07/13/2014    Procedure: Transmetatarsal Amputation;  Surgeon: Tanner Minion, MD;  Location: [place];  Service: Orthopedics;  Laterality: Left;  . Lower extremity angiogram N/A 02/15/2013    Procedure: LOWER EXTREMITY ANGIOGRAM;  Surgeon: Tanner Harp, MD;  Location: [facility] CATH LAB;  Service: Cardiovascular;  Laterality: N/A;  . Left heart catheterization with coronary angiogram N/A 05/11/2014    Procedure: LEFT HEART CATHETERIZATION WITH CORONARY ANGIOGRAM;  Surgeon: Tanner Blanks, MD;  Location: [facility] CATH LAB;  Service: Cardiovascular;  Laterality: N/A;  . Percutaneous coronary stent intervention (pci-s) N/A 05/13/2014    Procedure: PERCUTANEOUS CORONARY STENT INTERVENTION (PCI-S);  Surgeon: Tanner Booze, MD;  Location: [facility] CATH LAB;  Service: Cardiovascular;  Laterality: N/A;  . Lower extremity angiogram N/A 06/06/2014    Procedure: LOWER EXTREMITY ANGIOGRAM;  Surgeon: Tanner Harp, MD;  Location:  [place] CATH LAB;  Service: Cardiovascular;  Laterality: N/A;  . Amputation Left 08/20/2014    Procedure: Revision Transmetatarsal Amputation versus Below Knee Amputation;  Surgeon: Tanner Minion, MD;  Location: [place];  Service: Orthopedics;  Laterality: Left;  . Stump revision Left 09/23/2014    Procedure: Revision Left Below Knee Amputation;  Surgeon: Tanner Minion, MD;  Location: [place];  Service: Orthopedics;  Laterality: Left;  . Stump revision Left 10/13/2014    Procedure: REVISION LEFT BELOW KNEE AMPUTATION STUMP;  Surgeon: Tanner Rossetti, MD;  Location: [facility];  Service: Orthopedics;  Laterality: Left;     Allergies  No Known Allergies  HPI   This 74 year old gentleman  has a past history of known aortic valvular disease and ischemic heart disease.  He underwent aortic valve replacement with a St. Jude mechanical aortic valve in 1990.  He has been on long-term Coumadin which is managed through Dr. Horace Porteous office.  The patient has known ischemic heart disease and had CABG 6 in 1994.  Subsequently he has had multiple percutaneous procedures for coronary artery disease.  He has significant peripheral arterial occlusive disease and has a left BK amputation.  He has COPD.  He has quit smoking many times, most recently in September 2015. He came in last night because of worsening dyspnea.  He has been coughing up yellow sputum.  He has not been aware of any fever or chills.  He has had mild chronic edema of his right lower leg and foot.  He has been sleeping on one pillow.  For the past 2 nights he has awakened around midnight with substernal chest discomfort.  He took Tums for this with equivocal response.  He has nitroglycerin on hand but they are outdated and he did not think to use them.  The patient has recently been nonambulatory because of his left BKA.  He earlier this week was fitted for his prosthesis.  Up until now he has been wheelchair-bound and the prosthesis is being ordered. On this admission he is found to be anemic.  He denies any history of melena.  He does have occasional bright red blood after bowel movement which he has attributed to hemorrhoids. His last echocardiogram was in September 2015 and showed the following: - Left ventricle: The cavity size was at the upper limits of normal. Wall thickness was increased in a pattern of moderate LVH. Systolic function was mildly reduced. The estimated ejection fraction was in the range of 45% to 50%. Left ventricular diastolic function parameters were normal. - Aortic valve: A mechanical prosthesis was present. There was moderate stenosis. Valve area (Vmax): 4.01 cm^2. - Left atrium: The atrium  was moderately dilated. - Right atrium: The atrium was moderately dilated.  Inpatient Medications  . allopurinol  300 mg Oral QHS  . clopidogrel  75 mg Oral Q breakfast  . furosemide  40 mg Intravenous Q12H  . insulin aspart  0-9 Units Subcutaneous TID WC  . ipratropium-albuterol  3 mL Nebulization Q4H  . isosorbide mononitrate  30 mg Oral Daily  . metoprolol succinate  50 mg Oral Daily  . pregabalin  50 mg Oral BID  . rosuvastatin  20 mg Oral QHS  . sodium chloride  3 mL Intravenous Q12H  . Warfarin - Pharmacist Dosing Inpatient   Does not apply q1800    Family History Family History  Problem Relation Age of Onset  . Colon cancer Neg Hx   . Liver disease Neg  Hx      Social History History   Social History  . Marital Status: Legally Separated    Spouse Name: N/A  . Number of Children: 5  . Years of Education: N/A   Occupational History  . retired     Stage manager   Social History Main Topics  . Smoking status: Former Smoker -- 1.00 packs/day for 55 years    Types: Cigarettes    Start date: 08/19/1953    Quit date: 05/07/2014  . Smokeless tobacco: Current User    Types: Chew    Last Attempt to Quit: 08/20/1995     Comment: Has quit on 3 occasions. Counseling given today 5-10 minutes   I am more than likely going to quit "  . Alcohol Use: No     Comment: socially, sometimes 12 ounce beer daily, may go month without/no whiskey  . Drug Use: No  . Sexual Activity: Not on file   Other Topics Concern  . Not on file   Social History Narrative   Has 3 daughters   Has 2 sons     Review of Systems  General:  No chills, fever, night sweats .  His weight is up about 9 pounds since February. Cardiovascular:  Positive for chest pain at rest Dermatological: No rash, lesions/masses Respiratory: Recent cough and yellow sputum Urologic: No hematuria, dysuria Abdominal:   No nausea, vomiting, diarrhea, bright red blood per rectum, melena, or hematemesis Neurologic:   No visual changes, wkns, changes in mental status. All other systems reviewed and are otherwise negative except as noted above.  Physical Exam  Blood pressure 128/54, pulse 82, resp. rate 16, weight 211 lb 6.4 oz (95.89 kg), SpO2 95 %.  General: Pleasant, NAD.  Nasal oxygen in place. Psych: Normal affect. Neuro: Alert and oriented X 3. Moves all extremities spontaneously. HEENT: Normal  Neck: Soft bilateral carotid bruits Lungs:  There are bibasilar rales worse on the right Heart: RRR no s3, s4.  Grade 1/6 systolic ejection murmur at base.  Good aortic closure sound.  No aortic insufficiency. Abdomen: Soft, non-tender, non-distended, BS + x 4.  Extremities: Left BKA.  Right leg reveals mild pretibial edema and his pedal pulses in the right foot are weak.  Labs   Recent Labs  12/28/14 0134 12/28/14 0622  TROPONINI 0.05* 0.24*   Lab Results  Component Value Date   WBC 14.2* 12/28/2014   HGB 10.5* 12/28/2014   HCT 31.0* 12/28/2014   MCV 79.3 12/28/2014   PLT 326 12/28/2014     Recent Labs Lab 12/28/14 0134 12/28/14 0158  NA 143 143  K 4.2 4.2  CL 106 107  CO2 24  --   BUN 25* 29*  CREATININE 1.25* 1.20  CALCIUM 9.3  --   PROT 6.6  --   BILITOT 0.8  --   ALKPHOS 30*  --   ALT 13*  --   AST 26  --   GLUCOSE 287* 282*   Lab Results  Component Value Date   CHOL 123 05/09/2014   HDL 29* 05/09/2014   LDLCALC 62 05/09/2014   TRIG 161* 05/09/2014   Lab Results  Component Value Date   DDIMER <0.27 01/19/2014    Radiology/Studies  Dg Chest Port 1 View  12/28/2014   CLINICAL DATA:  Mid chest pain and shortness of breath tonight.  EXAM: PORTABLE CHEST - 1 VIEW  COMPARISON:  05/12/2014  FINDINGS: Postoperative changes in the mediastinum. Interval removal of endotracheal and  enteric tubes. Cardiac enlargement with mild pulmonary vascular congestion. Increasing airspace disease in the lung bases may be due to edema or pneumonia. No definite blunting of costophrenic  angles. No pneumothorax. Old resection or resorption of the distal right clavicle. Calcification of the aorta.  IMPRESSION: Cardiac enlargement with mild pulmonary vascular congestion. Increasing airspace disease in both lung bases since previous study.   Electronically Signed   By: Lucienne Capers M.D.   On: 12/28/2014 02:06    ECG  28-Dec-2014 01:27:40 [facility]/ED ROUTINE RECORD Sinus or ectopic atrial tachycardia Ventricular premature complex LVH with secondary repolarization abnormality Prolonged QT interval rate has increased from prior with continued frequent PVCs Confirmed by OTTER MD, OLGA ([id number]) on 12/28/2014 1:30:16 AM Personally reviewed.  ASSESSMENT AND PLAN 1.  Acute respiratory distress, likely multifactorial, secondary to community-acquired pneumonia and exacerbation of acute on chronic systolic heart failure.  His white count is elevated at 14,000.  His B natruretic peptide is elevated. 2.  Elevated troponin at 0.24 consistent with demand ischemia secondary to stress of pneumonia, hypoxemia, and anemia.  Continue Plavix 3.  St. Jude mechanical aortic valve prosthesis.  Continue warfarin 4.  COPD with probable community-acquired pneumonia.  Antibiotics per primary team 5.  Anemia etiology to be determined.  Per primary team.  Plan: Cycle troponins.  Agree with treatment of his acute on chronic systolic heart failure with Lasix.  His renal function is acceptable. We will update his echocardiogram. Will follow with you  Signed, Warren Danes, MD  12/28/2014, 9:39 AM

## 2014-12-28 NOTE — ED Notes (Signed)
Patient with chest pain and shortness of breath.  Patient was found in the tripod position,only being able to speak two to three word sentences, shortness of breath.  Patient was placed on CPAP, given 2 sl nitro, '125mg'$  solumedrol IV.  Patient taken off CPAP and placed on neb of 2.'5mg'$  albuterol and 0.'5mg'$  atrovent on arrival to ED.  Patient did have 2 previous albuterol treatments.  Patient with audible rales and rhonchii. Productive cough.

## 2014-12-28 NOTE — H&P (Signed)
[facility] History and Physical  Patient: Tanner Pena  MRN: [id number]  DOB: 1940/11/17  DOS: the patient was seen and examined on 12/28/2014 PCP: [name], MD  Referring physician: Dr. Sharol Given Chief Complaint: Shortness of breath  HPI: Tanner Pena is a 74 y.o. 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 The patient is presenting with complaints of shortness of breath which is progressively worsening over last 2 days. He has orthopnea and PND which is chronic. He has chronic swelling of the right leg. He denies any chest pain at the time of my evaluation but did have some chest tightness earlier. He has cough with yellow expectoration. He has fever as well as chills. Denies any sick contact or recent travel or recent hospitalization. He mentions he is compliant with all his medications. His earlier weight in February was 202 pounds today he is 211 pounds. He denies any diarrhea or constipation or burning urination. Denies any recent change in his medications.  The patient is coming from home And at his baseline independent for most of his ADL.  Review of Systems: as mentioned in the history of present illness.  A comprehensive review of the other systems is negative.  Past Medical History  Diagnosis Date  . Diabetes mellitus 2007  . Hypertension   . Gout   . Hypercholesteremia   . Peripheral vascular disease     stents lower extremity  . COPD (chronic obstructive pulmonary disease)   . S/P aortic valve replacement 1990    a. St. Jude  . Chronic back pain   . Dysphagia   . Neuromuscular disorder   . Peripheral neuropathy   . Critical lower limb ischemia   . CAD (coronary artery disease)     a. 05/13/14 [name] s/p overlapping DESx2 to SVG to RCA. b.   s/p CABG '90 with redo '94 & stent to RCA SVG in 2005  . Chronic toe ulcer     a. left foot  . Shortness of breath   . Myocardial infarction   . CHF (congestive heart failure)   . Stone in kidney   . GERD (gastroesophageal reflux disease)   . Arthritis   . Sleep apnea     tested greater than 7 years ago per patient   Past Surgical History  Procedure Laterality Date  . Open heart surgery  1990    prosthetic heart valve, one bypass  . Rotator cuff repair      right  . Cataract extraction      bilateral  . Coronary stent placement  2005    RCA vein graft A 3.0x13.0 TAXUS stent was then placed int he vessel a Viva 3.0x4.0 (perfusion balloon was made ready it was placed through the entire lenght of the stent  . Peripheral vascular procedures lower extremities      right external iliac  artery PTA and stenting as well as bilateral SFA intervention remotely. Repeat procedures in 2011 bilaterally  . Coronary artery bypass graft  1994    6 vessels  . [name]  06/13/2011    Procedure: Venia Minks DILATION;  Surgeon: [name], MD;  Location: [facility];  Service: Endoscopy;  Laterality: N/A;  Dilated to 56.   . Angioplasty illiac artery    . Back surgery  [id number]    2  .  Eye surgery    . Amputation Left 07/13/2014    Procedure: Transmetatarsal Amputation;  Surgeon: [name], MD;  Location: [place];  Service: Orthopedics;  Laterality: Left;  . Lower extremity angiogram N/A 02/15/2013    Procedure: LOWER EXTREMITY ANGIOGRAM;  Surgeon: [name], MD;  Location: [facility] CATH LAB;  Service: Cardiovascular;  Laterality: N/A;  . Left heart catheterization with coronary angiogram N/A 05/11/2014    Procedure: LEFT HEART CATHETERIZATION WITH CORONARY ANGIOGRAM;  Surgeon: [name], MD;  Location: [facility] CATH LAB;  Service: Cardiovascular;  Laterality: N/A;  . Percutaneous coronary stent intervention (pci-s) N/A 05/13/2014    Procedure: PERCUTANEOUS CORONARY STENT INTERVENTION  (PCI-S);  Surgeon: [name], MD;  Location: [facility] CATH LAB;  Service: Cardiovascular;  Laterality: N/A;  . Lower extremity angiogram N/A 06/06/2014    Procedure: LOWER EXTREMITY ANGIOGRAM;  Surgeon: [name], MD;  Location: [facility] CATH LAB;  Service: Cardiovascular;  Laterality: N/A;  . Amputation Left 08/20/2014    Procedure: Revision Transmetatarsal Amputation versus Below Knee Amputation;  Surgeon: [name], MD;  Location: [place];  Service: Orthopedics;  Laterality: Left;  . Stump revision Left 09/23/2014    Procedure: Revision Left Below Knee Amputation;  Surgeon: [name], MD;  Location: [place];  Service: Orthopedics;  Laterality: Left;  . Stump revision Left 10/13/2014    Procedure: REVISION LEFT BELOW KNEE AMPUTATION STUMP;  Surgeon: [name], MD;  Location: [facility];  Service: Orthopedics;  Laterality: Left;   Social History:  reports that he quit smoking about 7 months ago. His smoking use included Cigarettes. He started smoking about 61 years ago. He has a 55 pack-year smoking history. His smokeless tobacco use includes Chew. He reports that he does not drink alcohol or use illicit drugs.  No Known Allergies  Family History  Problem Relation Age of Onset  . Colon cancer Neg Hx   . Liver disease Neg Hx     Prior to Admission medications   Medication Sig Start Date End Date Taking? Authorizing Provider  albuterol (VOSPIRE ER) 4 MG 12 hr tablet Take 4 mg by mouth 2 (two) times daily.    Yes Historical Provider, MD  albuterol-ipratropium (COMBIVENT) 18-103 MCG/ACT inhaler Inhale 1 puff into the lungs 4 (four) times daily. Coughing/ Shortness of Breath   Yes Historical Provider, MD  allopurinol (ZYLOPRIM) 300 MG tablet Take 300 mg by mouth at bedtime.    Yes Historical Provider, MD  Ascorbic Acid (VITAMIN C WITH ROSE HIPS) 1000 MG tablet Take 1,000 mg by mouth daily.   Yes Historical Provider, MD  cholecalciferol (VITAMIN D) 1000 UNITS tablet Take 2,000 Units  by mouth daily.   Yes Historical Provider, MD  clopidogrel (PLAVIX) 75 MG tablet Take 1 tablet (75 mg total) by mouth daily with breakfast. 05/17/14  Yes Eileen Stanford, PA-C  fenofibrate (TRICOR) 145 MG tablet Take 1 tablet (145 mg total) by mouth daily. 02/24/14  Yes Troy Sine, MD  fish oil-omega-3 fatty acids 1000 MG capsule Take 1 capsule (1 g total) by mouth 2 (two) times daily. 01/22/13  Yes Kristin L Alvstad, RPH-CPP  furosemide (LASIX) 80 MG tablet Take 40 mg by mouth daily.   Yes Historical Provider, MD  glimepiride (AMARYL) 1 MG tablet Take 1 mg by mouth 2 (two) times daily.   Yes Historical Provider, MD  isosorbide mononitrate (IMDUR) 30 MG 24 hr tablet TAKE ONE TABLET BY MOUTH ONCE DAILY. 10/25/14  Yes Troy Sine, MD  metoprolol succinate (TOPROL-XL) 50 MG 24 hr tablet Take 1 tablet (50 mg total) by mouth daily. Take with or immediately following a meal. 05/17/14  Yes Eileen Stanford, PA-C  nitroGLYCERIN (NITROSTAT) 0.4 MG SL tablet Place 1 tablet (0.4 mg total) under the tongue every 5 (five) minutes as needed for chest pain. 05/17/14  Yes Eileen Stanford, PA-C  oxyCODONE-acetaminophen (PERCOCET) 10-325 MG per tablet Take 1 tablet by mouth every 4 (four) hours as needed for pain. 09/23/14  Yes [name], MD  potassium chloride SA (K-DUR,KLOR-CON) 20 MEQ tablet Take 20 mEq by mouth daily.     Yes Historical Provider, MD  pregabalin (LYRICA) 50 MG capsule Take one capsule by mouth twice daily for pains 08/23/14  Yes Mahima Pandey, MD  ramipril (ALTACE) 2.5 MG capsule TAKE 1 CAPSULE BY MOUTH AT BEDTIME. 09/27/14  Yes [name], MD  ramipril (ALTACE) 5 MG capsule TAKE 1 CAPSULE BY MOUTH EVERY MORNING. 09/27/14  Yes [name], MD  rosuvastatin (CRESTOR) 20 MG tablet Take 20 mg by mouth at bedtime.   Yes Historical Provider, MD  sitaGLIPtin (JANUVIA) 50 MG tablet Take 50 mg by mouth daily.   Yes Historical Provider, MD  warfarin (COUMADIN) 5 MG tablet Take 1-1.5 tablets  (5-7.5 mg total) by mouth at bedtime. Takes one and one-half tablet (7.'5mg'$  total) on Mondays and Fridays. Takes one tablet ('5mg'$  total) on all other days Patient taking differently: Take 5-7.5 mg by mouth at bedtime. Takes one-half tablet (7.'5mg'$  total) on Fridays. Takes one tablet ('5mg'$  total) on all other days 05/17/14  Yes Eileen Stanford, PA-C  zinc gluconate 50 MG tablet Take 50 mg by mouth daily.   Yes Historical Provider, MD  acetaminophen (TYLENOL) 500 MG tablet Take 500 mg by mouth every 6 (six) hours as needed for moderate pain.    Historical Provider, MD  aspirin EC 325 MG tablet Take 1 tablet (325 mg total) by mouth daily. Patient not taking: Reported on 10/13/2014 08/22/14   [name], MD  cephALEXin (KEFLEX) 500 MG capsule Take 1 capsule (500 mg total) by mouth 2 (two) times daily. Patient not taking: Reported on 08/20/2014 06/22/14   [name], MD  methocarbamol (ROBAXIN) 500 MG tablet Take 1 tablet (500 mg total) by mouth every 6 (six) hours as needed for muscle spasms. Patient not taking: Reported on 12/28/2014 10/14/14   Pete Pelt, PA-C  nicotine (NICODERM CQ - DOSED IN MG/24 HOURS) 14 mg/24hr patch Place 1 patch (14 mg total) onto the skin daily. Patient not taking: Reported on 08/20/2014 05/17/14   Eileen Stanford, PA-C  oxyCODONE-acetaminophen (ROXICET) 5-325 MG per tablet Take 1 tablet by mouth every 4 (four) hours as needed for severe pain. Patient not taking: Reported on 09/23/2014 07/13/14   [name], MD    Physical Exam: Filed Vitals:   12/28/14 0530 12/28/14 0545 12/28/14 0600 12/28/14 0615  BP: 144/53 149/119 133/87 127/105  Pulse: 77 95 83 81  Resp: '21 26 24 20  '$ Weight:      SpO2: 100% 97% 97% 95%    General: Alert, Awake and Oriented to Time, Place and Person. Appear in mild distress Eyes: PERRL ENT: Oral Mucosa clear moist. Neck: Difficult to assess JVD Cardiovascular: S1 and S2 Present, aortic systolic Murmur, Peripheral Pulses  Present Respiratory: Bilateral Air entry equal and Decreased,  Bilateral extensive rhonchi and basal Crackles, occasional wheezes Abdomen: Bowel  Sound present, Soft and non- tender Skin: No Rash Extremities: Right leg Pedal edema, no calf tenderness Left leg BKA Neurologic: Grossly no focal neuro deficit.  Labs on Admission:  CBC:  Recent Labs Lab 12/28/14 0134 12/28/14 0158  WBC 14.2*  --   NEUTROABS 9.7*  --   HGB 8.7* 10.5*  HCT 29.9* 31.0*  MCV 79.3  --   PLT 326  --     CMP     Component Value Date/Time   NA 143 12/28/2014 0158   K 4.2 12/28/2014 0158   CL 107 12/28/2014 0158   CO2 24 12/28/2014 0134   GLUCOSE 282* 12/28/2014 0158   BUN 29* 12/28/2014 0158   CREATININE 1.20 12/28/2014 0158   CREATININE 1.15 05/31/2014 0940   CALCIUM 9.3 12/28/2014 0134   PROT 6.6 12/28/2014 0134   ALBUMIN 3.5 12/28/2014 0134   AST 26 12/28/2014 0134   ALT 13* 12/28/2014 0134   ALKPHOS 30* 12/28/2014 0134   BILITOT 0.8 12/28/2014 0134   GFRNONAA 55* 12/28/2014 0134   GFRAA >60 12/28/2014 0134    No results for input(s): LIPASE, AMYLASE in the last 168 hours.   Recent Labs Lab 12/28/14 0134  TROPONINI 0.05*   BNP (last 3 results)  Recent Labs  12/28/14 0134  BNP 504.3*    ProBNP (last 3 results)  Recent Labs  01/19/14 2042 05/10/14 0229 05/11/14 1530  PROBNP 1373.0* 820.3* 1302.0*     Radiological Exams on Admission: Dg Chest Port 1 View  12/28/2014   CLINICAL DATA:  Mid chest pain and shortness of breath tonight.  EXAM: PORTABLE CHEST - 1 VIEW  COMPARISON:  05/12/2014  FINDINGS: Postoperative changes in the mediastinum. Interval removal of endotracheal and enteric tubes. Cardiac enlargement with mild pulmonary vascular congestion. Increasing airspace disease in the lung bases may be due to edema or pneumonia. No definite blunting of costophrenic angles. No pneumothorax. Old resection or resorption of the distal right clavicle. Calcification of the aorta.   IMPRESSION: Cardiac enlargement with mild pulmonary vascular congestion. Increasing airspace disease in both lung bases since previous study.   Electronically Signed   By: Lucienne Capers M.D.   On: 12/28/2014 02:06   EKG: Independently reviewed. Sinus tachycardia, prolonged QTC.  Assessment/Plan Principal Problem:   Acute respiratory failure Active Problems:   H/O aortic valve replacement- St Jude   CAD - CABG '90 with re do '94    DM2 (diabetes mellitus, type 2)   Carotid stenosis   Acute on chronic combined systolic and diastolic CHF   Below knee amputation status   CAP (community acquired pneumonia)   COPD with exacerbation   1. Acute respiratory failure  Acute on chronic severe combined systolic and diastolic CHF. COPD exacerbation.  The patient is presenting with complaints of shortness of breath. He is hypoxic requiring BiPAP. ABG shows PO2 of 32 which suggest this is a venous gas. Patient has extensive bilateral rhonchi. Increased work of breathing. With this possible etiology is acute on chronic CHF with acute on chronic COPD flareup. The patient will be admitted in the step down unit. I would repeat his ABG. Continue with BiPAP as needed. We will treat him with Zosyn Follow cultures. I would also use DuoNeb's. Holding off on prednisone. We'll give him a dose of Lasix. Monitor ins and outs and daily weight.  2. Peripheral vascular disease, coronary artery disease, Continue home medications. EKG does not show any evidence of ischemia. Troponin negative.  3. History of  aortic valve replacement with mechanical valve Continuing warfarin. INR therapeutic.  4. Diabetes mellitus. Continue him on insulin holding oral hypoglycemic agents.  Advance goals of care discussion: Full code   DVT Prophylaxis: on chronic therapeutic anticoagulation. Nutrition: Cardiac and diabetic diet  Family Communication: family was present at bedside, opportunity was given to ask  question and all questions were answered satisfactorily at the time of interview. Disposition: Admitted as inpatient, step-down unit.  Author: Berle Mull, MD [facility] Pager: [phone] 12/28/2014  If 7PM-7AM, please contact night-coverage [email or web address] Password [email or web address]

## 2014-12-28 NOTE — ED Notes (Signed)
Portable at bedside 

## 2014-12-28 NOTE — Procedures (Signed)
Discussed BIPAP with pt.  Pt has one at home but does not like it and does not use it.  Pt refuses to try/use hospital machine.

## 2014-12-29 ENCOUNTER — Inpatient Hospital Stay (HOSPITAL_COMMUNITY): Payer: Medicare Other

## 2014-12-29 ENCOUNTER — Telehealth: Payer: Self-pay | Admitting: Pharmacist Clinician (PhC)/ Clinical Pharmacy Specialist

## 2014-12-29 DIAGNOSIS — Z7901 Long term (current) use of anticoagulants: Secondary | ICD-10-CM

## 2014-12-29 DIAGNOSIS — Z952 Presence of prosthetic heart valve: Secondary | ICD-10-CM

## 2014-12-29 DIAGNOSIS — I509 Heart failure, unspecified: Secondary | ICD-10-CM

## 2014-12-29 DIAGNOSIS — R0602 Shortness of breath: Secondary | ICD-10-CM | POA: Insufficient documentation

## 2014-12-29 LAB — GRAM STAIN

## 2014-12-29 LAB — BASIC METABOLIC PANEL
ANION GAP: 13 (ref 5–15)
BUN: 34 mg/dL — ABNORMAL HIGH (ref 6–20)
CHLORIDE: 101 mmol/L (ref 101–111)
CO2: 28 mmol/L (ref 22–32)
Calcium: 9.8 mg/dL (ref 8.9–10.3)
Creatinine, Ser: 1.19 mg/dL (ref 0.61–1.24)
GFR calc non Af Amer: 58 mL/min — ABNORMAL LOW (ref >60)
Glucose, Bld: 192 mg/dL — ABNORMAL HIGH (ref 65–99)
POTASSIUM: 4.1 mmol/L (ref 3.5–5.1)
Sodium: 142 mmol/L (ref 135–145)

## 2014-12-29 LAB — GLUCOSE, CAPILLARY
GLUCOSE-CAPILLARY: 168 mg/dL — AB (ref 65–99)
Glucose-Capillary: 176 mg/dL — ABNORMAL HIGH (ref 65–99)
Glucose-Capillary: 218 mg/dL — ABNORMAL HIGH (ref 65–99)
Glucose-Capillary: 168 mg/dL — ABNORMAL HIGH (ref 65–99)

## 2014-12-29 LAB — INFLUENZA PANEL BY PCR (TYPE A & B)
H1N1 flu by pcr: NOT DETECTED
INFLAPCR: NEGATIVE
Influenza B By PCR: NEGATIVE

## 2014-12-29 LAB — LEGIONELLA ANTIGEN, URINE

## 2014-12-29 LAB — EXPECTORATED SPUTUM ASSESSMENT W REFEX TO RESP CULTURE

## 2014-12-29 LAB — PROTIME-INR
INR: 3.78 — ABNORMAL HIGH (ref 0.00–1.49)
PROTHROMBIN TIME: 37.6 s — AB (ref 11.6–15.2)

## 2014-12-29 LAB — EXPECTORATED SPUTUM ASSESSMENT W GRAM STAIN, RFLX TO RESP C

## 2014-12-29 MED ORDER — PANTOPRAZOLE SODIUM 40 MG PO TBEC
40.0000 mg | DELAYED_RELEASE_TABLET | Freq: Every day | ORAL | Status: DC
  Administered 2014-12-29 – 2015-01-01 (×4): 40 mg via ORAL
  Filled 2014-12-29 (×4): qty 1

## 2014-12-29 MED ORDER — PROMETHAZINE HCL 25 MG/ML IJ SOLN
12.5000 mg | Freq: Once | INTRAMUSCULAR | Status: AC
  Administered 2014-12-29: 12.5 mg via INTRAVENOUS
  Filled 2014-12-29: qty 1

## 2014-12-29 MED ORDER — IPRATROPIUM-ALBUTEROL 0.5-2.5 (3) MG/3ML IN SOLN
3.0000 mL | Freq: Four times a day (QID) | RESPIRATORY_TRACT | Status: DC
  Administered 2014-12-29 – 2014-12-31 (×9): 3 mL via RESPIRATORY_TRACT
  Filled 2014-12-29 (×9): qty 3

## 2014-12-29 NOTE — Progress Notes (Signed)
[facility] PROGRESS NOTE  Tanner Pena EQA:[id number] DOB: Mar 02, 1941 DOA: 12/28/2014 PCP: [name], MD  Assessment/Plan: Tanner Pena is a 74 y.o. 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 The patient is presenting with complaints of shortness of breath which is progressively worsening over last 2 days. Chest x ray showed: Cardiac enlargement with mild pulmonary vascular congestion. Increasing airspace disease in both lung bases since previous study. WBC at 14, BNP at 504.  1-Acute Respiratory Failure: Multifactorial, Heart Failure exacerbation, COPD.  Continue with nebulizer treatments, ceftriaxone. Schedule lasix IV BID.  Strep PNA negative, Influenza pending.  Legionella antigen pending.   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 Lasix 40 mg IV bid.  Cardiology consulted.   3-History of Aortic Valve replacement with mechanical Valve:  Coumadin per pharmacy.   3-Diabetes: SSI.   4-Elevated troponin; cardiology consulted. Check echo./ on Imdur, Plavix. coumadin  5-Abdominal Pain; resolved. Will start Protonix. Abdominal exam normal.    Code Status: Full Code.  Family Communication: care discussed with family at bedside.  Disposition Plan: Step down unit appropriate.    Consultants:  Cardiology  Procedures:  none  Antibiotics:  Ceftriaxone  HPI/Subjective: He is feeling better this morning. He had an episode of abdominal pain last night and chest pain.  No chest pain or abdominal pain this morning.  He is breathing better.   Objective: Filed Vitals:   12/29/14 0728  BP: 166/51  Pulse: 63  Temp: 98.6 F (37 C)  Resp: 19    Intake/Output Summary (Last 24 hours) at 12/29/14 0825 Last data filed at 12/29/14  0100  Gross per 24 hour  Intake      0 ml  Output   3805 ml  Net  -3805 ml   Filed Weights   12/28/14 0501 12/28/14 1850 12/29/14 0416  Weight: 95.89 kg (211 lb 6.4 oz) 95.5 kg (210 lb 8.6 oz) 94.5 kg (208 lb 5.4 oz)    Exam:   General:  Alert in no distress.   Cardiovascular: S 1, S 2 RRR  Respiratory: Sporadics wheezing.   Abdomen: BS present, soft, nt  Musculoskeletal: BKA left LE, right lower extremity with plus 2 edema.   Data Reviewed: Basic Metabolic Panel:  Recent Labs Lab 12/28/14 0134 12/28/14 0158 12/28/14 0622 12/29/14 0240  NA 143 143  --  142  K 4.2 4.2  --  4.1  CL 106 107  --  101  CO2 24  --   --  28  GLUCOSE 287* 282*  --  192*  BUN 25* 29*  --  34*  CREATININE 1.25* 1.20  --  1.19  CALCIUM 9.3  --   --  9.8  MG  --   --  2.0  --    Liver Function Tests:  Recent Labs Lab 12/28/14 0134  AST 26  ALT 13*  ALKPHOS 30*  BILITOT 0.8  PROT 6.6  ALBUMIN 3.5   No results for input(s): LIPASE, AMYLASE in the last 168 hours. No results for input(s): AMMONIA in the last 168 hours. CBC:  Recent Labs Lab 12/28/14 0134 12/28/14 0158  WBC 14.2*  --   NEUTROABS 9.7*  --   HGB 8.7* 10.5*  HCT 29.9* 31.0*  MCV 79.3  --   PLT 326  --    Cardiac  Enzymes:  Recent Labs Lab 12/28/14 0134 12/28/14 0622 12/28/14 1143 12/28/14 2013  TROPONINI 0.05* 0.24* 0.57* 0.77*   BNP (last 3 results)  Recent Labs  12/28/14 0134  BNP 504.3*    ProBNP (last 3 results)  Recent Labs  01/19/14 2042 05/10/14 0229 05/11/14 1530  PROBNP 1373.0* 820.3* 1302.0*    CBG:  Recent Labs Lab 12/28/14 1243 12/28/14 1752 12/28/14 2148  GLUCAP 369* 277* 259*    Recent Results (from the past 240 hour(s))  MRSA PCR Screening     Status: None   Collection Time: 12/28/14  6:50 PM  Result Value Ref Range Status   MRSA by PCR NEGATIVE NEGATIVE Final    Comment:        The GeneXpert MRSA Assay (FDA approved for NASAL specimens only), is one  component of a comprehensive MRSA colonization surveillance program. It is not intended to diagnose MRSA infection nor to guide or monitor treatment for MRSA infections.      Studies: Dg Chest Port 1 View  12/28/2014   CLINICAL DATA:  Mid chest pain and shortness of breath tonight.  EXAM: PORTABLE CHEST - 1 VIEW  COMPARISON:  05/12/2014  FINDINGS: Postoperative changes in the mediastinum. Interval removal of endotracheal and enteric tubes. Cardiac enlargement with mild pulmonary vascular congestion. Increasing airspace disease in the lung bases may be due to edema or pneumonia. No definite blunting of costophrenic angles. No pneumothorax. Old resection or resorption of the distal right clavicle. Calcification of the aorta.  IMPRESSION: Cardiac enlargement with mild pulmonary vascular congestion. Increasing airspace disease in both lung bases since previous study.   Electronically Signed   By: Lucienne Capers M.D.   On: 12/28/2014 02:06    Scheduled Meds: . allopurinol  300 mg Oral QHS  . antiseptic oral rinse  7 mL Mouth Rinse BID  . cefTRIAXone (ROCEPHIN)  IV  1 g Intravenous Q24H  . clopidogrel  75 mg Oral Q breakfast  . furosemide  40 mg Intravenous Q12H  . insulin aspart  0-9 Units Subcutaneous TID WC  . isosorbide mononitrate  30 mg Oral Daily  . metoprolol succinate  50 mg Oral Daily  . pregabalin  50 mg Oral BID  . rosuvastatin  20 mg Oral QHS  . sodium chloride  3 mL Intravenous Q12H  . Warfarin - Pharmacist Dosing Inpatient   Does not apply q1800   Continuous Infusions:    Principal Problem:   Acute respiratory failure Active Problems:   H/O aortic valve replacement- St Jude   CAD - CABG '90 with re do '94    DM2 (diabetes mellitus, type 2)   Carotid stenosis   Acute on chronic combined systolic and diastolic CHF   Below knee amputation status   CAP (community acquired pneumonia)   COPD with exacerbation    Time spent: 35 minutes.     Niel Hummer  A  [facility] Pager [phone]. If 7PM-7AM, please contact night-coverage at [email or web address], password [facility] 12/29/2014, 8:25 AM  LOS: 1 day

## 2014-12-29 NOTE — Progress Notes (Signed)
ANTICOAGULATION CONSULT NOTE - Follow Up Consult  Pharmacy Consult for Warfarin Indication: Mechanical AVR  No Known Allergies  Patient Measurements: Height: '5\' 10"'$  (177.8 cm) Weight: 208 lb 5.4 oz (94.5 kg) IBW/kg (Calculated) : 73  Vital Signs: Temp: 98.8 F (37.1 C) (05/12 1600) Temp Source: Oral (05/12 1600) BP: 145/59 mmHg (05/12 1600) Pulse Rate: 30 (05/12 1600)  Labs:  Recent Labs  12/28/14 0134 12/28/14 0158 12/28/14 0622 12/28/14 1143 12/28/14 2013 12/29/14 0240  HGB 8.7* 10.5*  --   --   --   --   HCT 29.9* 31.0*  --   --   --   --   PLT 326  --   --   --   --   --   LABPROT 37.3*  --   --   --   --  37.6*  INR 3.75*  --   --   --   --  3.78*  CREATININE 1.25* 1.20  --   --   --  1.19  TROPONINI 0.05*  --  0.24* 0.57* 0.77*  --     Estimated Creatinine Clearance: 62.9 mL/min (by C-G formula based on Cr of 1.19).   Medications:  Scheduled:  . allopurinol  300 mg Oral QHS  . antiseptic oral rinse  7 mL Mouth Rinse BID  . cefTRIAXone (ROCEPHIN)  IV  1 g Intravenous Q24H  . clopidogrel  75 mg Oral Q breakfast  . furosemide  40 mg Intravenous Q12H  . insulin aspart  0-9 Units Subcutaneous TID WC  . ipratropium-albuterol  3 mL Nebulization Q6H  . isosorbide mononitrate  30 mg Oral Daily  . metoprolol succinate  50 mg Oral Daily  . pantoprazole  40 mg Oral Daily  . pregabalin  50 mg Oral BID  . rosuvastatin  20 mg Oral QHS  . sodium chloride  3 mL Intravenous Q12H  . Warfarin - Pharmacist Dosing Inpatient   Does not apply q1800   Infusions:    Assessment: Tanner Pena is an 74 y.o. male admitted on 12/28/2014 presenting with CP and respiratory distress. Patient admitted for possible CAP and exacerbation of HFrEF.  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  Pharmacy has been consulted to continue warfarin therapy.  According to outpatient records, goal INR 2.5-3.5.  Today's INR is SUPRAtherapeutic at 3.78.     Hgb 10.5, plt wnl.  No reports of bleeding at this time.    Goal of Therapy:  INR 2.5-3.5    Plan:  - Hold warfarin tonight - Daily INR - Monitor for signs and symptoms of bleeding  Hassie Bruce, Pharm. D. Clinical Pharmacy Resident Pager: [phone] Ph: [phone] 12/29/2014 5:06 PM

## 2014-12-29 NOTE — Progress Notes (Signed)
SUBJECTIVE:  Feels better.  OBJECTIVE:   Vitals:   Filed Vitals:   12/29/14 0728 12/29/14 0840 12/29/14 0905 12/29/14 1000  BP: 166/51  138/34 155/57  Pulse: 63  29 68  Temp: 98.6 F (37 C)     TempSrc: Oral     Resp: '19  20 21  '$ Height:      Weight:      SpO2: 100% 99% 100% 100%   I&O's:   Intake/Output Summary (Last 24 hours) at 12/29/14 1208 Last data filed at 12/29/14 1100  Gross per 24 hour  Intake      0 ml  Output   2130 ml  Net  -2130 ml   TELEMETRY: Reviewed telemetry pt in NSR, PVCs:     PHYSICAL EXAM General: Well developed, well nourished, in no acute distress Head:   Normal cephalic and atramatic  Lungs: Wheezing bilaterally to auscultation. Heart:   HRRR S1 crisp S2  click Abdomen: abdomen soft and non-tender Msk:  Back normal,  Normal strength and tone for age. Extremities:   No edema.   Neuro: Alert and oriented. Psych:  Normal affect, responds appropriately Skin: No rash   LABS: Basic Metabolic Panel:  Recent Labs  12/28/14 0134 12/28/14 0158 12/28/14 0622 12/29/14 0240  NA 143 143  --  142  K 4.2 4.2  --  4.1  CL 106 107  --  101  CO2 24  --   --  28  GLUCOSE 287* 282*  --  192*  BUN 25* 29*  --  34*  CREATININE 1.25* 1.20  --  1.19  CALCIUM 9.3  --   --  9.8  MG  --   --  2.0  --    Liver Function Tests:  Recent Labs  12/28/14 0134  AST 26  ALT 13*  ALKPHOS 30*  BILITOT 0.8  PROT 6.6  ALBUMIN 3.5   No results for input(s): LIPASE, AMYLASE in the last 72 hours. CBC:  Recent Labs  12/28/14 0134 12/28/14 0158  WBC 14.2*  --   NEUTROABS 9.7*  --   HGB 8.7* 10.5*  HCT 29.9* 31.0*  MCV 79.3  --   PLT 326  --    Cardiac Enzymes:  Recent Labs  12/28/14 0622 12/28/14 1143 12/28/14 2013  TROPONINI 0.24* 0.57* 0.77*   BNP: Invalid input(s): POCBNP D-Dimer: No results for input(s): DDIMER in the last 72 hours. Hemoglobin A1C: No results for input(s): HGBA1C in the last 72 hours. Fasting Lipid Panel: No  results for input(s): CHOL, HDL, LDLCALC, TRIG, CHOLHDL, LDLDIRECT in the last 72 hours. Thyroid Function Tests:  Recent Labs  12/28/14 0622  TSH 0.932   Anemia Panel: No results for input(s): VITAMINB12, FOLATE, FERRITIN, TIBC, IRON, RETICCTPCT in the last 72 hours. Coag Panel:   Lab Results  Component Value Date   INR 3.78* 12/29/2014   INR 3.75* 12/28/2014   INR 3.2 11/29/2014    RADIOLOGY: Dg Chest Port 1 View  12/28/2014   CLINICAL DATA:  Mid chest pain and shortness of breath tonight.  EXAM: PORTABLE CHEST - 1 VIEW  COMPARISON:  05/12/2014  FINDINGS: Postoperative changes in the mediastinum. Interval removal of endotracheal and enteric tubes. Cardiac enlargement with mild pulmonary vascular congestion. Increasing airspace disease in the lung bases may be due to edema or pneumonia. No definite blunting of costophrenic angles. No pneumothorax. Old resection or resorption of the distal right clavicle. Calcification of the aorta.  IMPRESSION: Cardiac enlargement  with mild pulmonary vascular congestion. Increasing airspace disease in both lung bases since previous study.   Electronically Signed   By: Lucienne Capers M.D.   On: 12/28/2014 02:06      ASSESSMENT: CAD, elevated troponin, SHOB, AVR  PLAN:  Elevated troponin likely due to heart failure. Continue with diuresis. He is improving. Would not plan on an ischemic workup at this time. He recently had a cath in September 2015 where a vein graft was intervened upon. Shortness of breath is likely related to volume overload and possible viral illness.  Coumadin for prosthetic aortic valve.  [name], MD  12/29/2014  12:08 PM

## 2014-12-29 NOTE — Progress Notes (Signed)
PT Cancellation Note  Patient Details Name: Tanner Pena MRN: [id number] DOB: 07/20/1941   Cancelled Treatment:    Reason Eval/Treat Not Completed: Medical issues which prohibited therapy (pt with continued rise in troponin and await cardiology clearance)   Lanetta Inch Beth 12/29/2014, 7:07 AM Elwyn Reach, [place]

## 2014-12-29 NOTE — Telephone Encounter (Signed)
Standing ordrer faxed to [employer] [phone]

## 2014-12-29 NOTE — Progress Notes (Signed)
Echocardiogram 2D Echocardiogram has been performed.  Tanner Pena 12/29/2014, 11:17 AM

## 2014-12-29 NOTE — Progress Notes (Signed)
Utilization Review Completed.Donne Anon T5/07/2015

## 2014-12-30 LAB — CBC
HCT: 28.8 % — ABNORMAL LOW (ref 39.0–52.0)
Hemoglobin: 8.4 g/dL — ABNORMAL LOW (ref 13.0–17.0)
MCH: 22.5 pg — AB (ref 26.0–34.0)
MCHC: 29.2 g/dL — ABNORMAL LOW (ref 30.0–36.0)
MCV: 77.2 fL — ABNORMAL LOW (ref 78.0–100.0)
Platelets: 296 10*3/uL (ref 150–400)
RBC: 3.73 MIL/uL — ABNORMAL LOW (ref 4.22–5.81)
RDW: 18.2 % — AB (ref 11.5–15.5)
WBC: 11.2 10*3/uL — AB (ref 4.0–10.5)

## 2014-12-30 LAB — GLUCOSE, CAPILLARY
GLUCOSE-CAPILLARY: 188 mg/dL — AB (ref 65–99)
GLUCOSE-CAPILLARY: 209 mg/dL — AB (ref 65–99)
Glucose-Capillary: 211 mg/dL — ABNORMAL HIGH (ref 65–99)
Glucose-Capillary: 150 mg/dL — ABNORMAL HIGH (ref 65–99)

## 2014-12-30 LAB — BASIC METABOLIC PANEL
Anion gap: 11 (ref 5–15)
BUN: 36 mg/dL — ABNORMAL HIGH (ref 6–20)
CO2: 32 mmol/L (ref 22–32)
Calcium: 9.5 mg/dL (ref 8.9–10.3)
Chloride: 99 mmol/L — ABNORMAL LOW (ref 101–111)
Creatinine, Ser: 1.14 mg/dL (ref 0.61–1.24)
GFR calc Af Amer: >60 mL/min (ref >60)
Glucose, Bld: 211 mg/dL — ABNORMAL HIGH (ref 65–99)
Potassium: 3.9 mmol/L (ref 3.5–5.1)
Sodium: 142 mmol/L (ref 135–145)

## 2014-12-30 LAB — PROTIME-INR
INR: 3.51 — ABNORMAL HIGH (ref 0.00–1.49)
Prothrombin Time: 35.4 seconds — ABNORMAL HIGH (ref 11.6–15.2)

## 2014-12-30 MED ORDER — WARFARIN SODIUM 5 MG PO TABS
5.0000 mg | ORAL_TABLET | Freq: Once | ORAL | Status: AC
  Administered 2014-12-30: 5 mg via ORAL
  Filled 2014-12-30 (×2): qty 1

## 2014-12-30 NOTE — Progress Notes (Signed)
ANTICOAGULATION CONSULT NOTE - Follow Up Consult  Pharmacy Consult for Warfarin Indication: Mechanical AVR  No Known Allergies  Patient Measurements: Height: '5\' 10"'$  (177.8 cm) Weight: 203 lb 7.8 oz (92.3 kg) IBW/kg (Calculated) : 73  Vital Signs: Temp: 98.1 F (36.7 C) (05/13 0420) Temp Source: Oral (05/13 0420) BP: 134/50 mmHg (05/13 0420) Pulse Rate: 54 (05/13 0420)  Labs:  Recent Labs  12/28/14 0134 12/28/14 0158 12/28/14 0622 12/28/14 1143 12/28/14 2013 12/29/14 0240 12/30/14 0230  HGB 8.7* 10.5*  --   --   --   --   --   HCT 29.9* 31.0*  --   --   --   --   --   PLT 326  --   --   --   --   --   --   LABPROT 37.3*  --   --   --   --  37.6* 35.4*  INR 3.75*  --   --   --   --  3.78* 3.51*  CREATININE 1.25* 1.20  --   --   --  1.19 1.14  TROPONINI 0.05*  --  0.24* 0.57* 0.77*  --   --     Estimated Creatinine Clearance: 64.9 mL/min (by C-G formula based on Cr of 1.14).   Medications:  Scheduled:  . allopurinol  300 mg Oral QHS  . antiseptic oral rinse  7 mL Mouth Rinse BID  . cefTRIAXone (ROCEPHIN)  IV  1 g Intravenous Q24H  . clopidogrel  75 mg Oral Q breakfast  . furosemide  40 mg Intravenous Q12H  . insulin aspart  0-9 Units Subcutaneous TID WC  . ipratropium-albuterol  3 mL Nebulization Q6H  . isosorbide mononitrate  30 mg Oral Daily  . metoprolol succinate  50 mg Oral Daily  . pantoprazole  40 mg Oral Daily  . pregabalin  50 mg Oral BID  . rosuvastatin  20 mg Oral QHS  . sodium chloride  3 mL Intravenous Q12H  . Warfarin - Pharmacist Dosing Inpatient   Does not apply q1800   Infusions:    Assessment: Tanner Pena is an 74 y.o. male admitted on 12/28/2014 presenting with CP and respiratory distress. Patient admitted for possible CAP and exacerbation of HFrEF.  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  Pharmacy has been consulted to continue warfarin therapy.  According to outpatient records, goal INR  2.5-3.5.  Today's INR is slightly supratherapeutic at 3.51.  Of note, patient is on Allopurinol (on PTA) which can increase INR.    Hgb 10.5, plt wnl.  No reports of bleeding at this time.    Goal of Therapy:  INR 2.5-3.5    Plan:  - Warfarin 5 mg x 1 tonight - Daily INR - Monitor for signs and symptoms of bleeding  Hassie Bruce, Pharm. D. Clinical Pharmacy Resident Pager: [phone] Ph: [phone] 12/30/2014 7:19 AM

## 2014-12-30 NOTE — Evaluation (Signed)
Physical Therapy Evaluation Patient Details Name: Tanner Pena MRN: [id number] DOB: 07-22-1941 Today's Date: 12/30/2014   History of Present Illness  Pt. is a 74 yo male admitted 12/28/14 with SOB, found to have acute respiratory failure/heart failure exacerbation/COPD.  Pt. with h/o aortic valve replacement with mechanical valve, CAD, DM, severe acute on chronic congestive heart failure, PVD.  With h/o L BKA.  He states he is currently in the process of getting a prosthesis, and has had the first impression made.    Clinical Impression  Tanner Pena presents to PT with a decline in his usual functionally independent level for transfers due to his acute illness.  At baseline, he does not do much ambulating by his report due to bad shoulders.  PT will focus on working toward return to modified independence at at transfers level in preparation for return home.  He expects to get his prosthesis in the near future and will need gait training at that time.  I do not believe he will need HHPT post acute DC.    Follow Up Recommendations No PT follow up    Equipment Recommendations  None recommended by PT    Recommendations for Other Services       Precautions / Restrictions Precautions Precautions: Fall Required Braces or Orthoses:  (has shrinker sock) Restrictions Weight Bearing Restrictions: No (L BKA)      Mobility  Bed Mobility Overal bed mobility: Modified Independent             General bed mobility comments: manages to move to sitting with HOB flat without assistance  Transfers Overall transfer level: Needs assistance Equipment used: None Transfers: Squat Pivot Transfers     Squat pivot transfers: Min assist     General transfer comment: pt. transferred bed to recliner without device to imitate home transfers; needed min steadying assist  Ambulation/Gait Ambulation/Gait assistance:  (deferred as pt. does not ambulate at home by his report)               Stairs            Wheelchair Mobility    Modified Rankin (Stroke Patients Only)       Balance Overall balance assessment: Needs assistance Sitting-balance support: No upper extremity supported;Feet supported (foot supported) Sitting balance-Leahy Scale: Good     Standing balance support: Bilateral upper extremity supported;During functional activity Standing balance-Leahy Scale: Poor Standing balance comment: needs min assist for balancing druing transfers                             Pertinent Vitals/Pain Pain Assessment: No/denies pain    Home Living Family/patient expects to be discharged to:: Private residence Living Arrangements: Other relatives (nephew available 24/7 to assist) Available Help at Discharge: Available 24 hours/day;Family Type of Home: House Home Access: Stairs to enter;Ramped entrance     Home Layout: One level Home Equipment: Wheelchair - manual;Crutches;Walker - 2 wheels;Hand held shower head;Shower seat      Prior Function Level of Independence: Independent with assistive device(s)         Comments: was able to transfer to<>from w/c independently PTA; did very little walking due to his shoulders (old surgery right shoulder per pt.)     Hand Dominance        Extremity/Trunk Assessment   Upper Extremity Assessment: Overall WFL for tasks assessed           Lower Extremity Assessment: Overall  WFL for tasks assessed      Cervical / Trunk Assessment: Normal  Communication   Communication: No difficulties  Cognition Arousal/Alertness: Awake/alert Behavior During Therapy: WFL for tasks assessed/performed Overall Cognitive Status: Within Functional Limits for tasks assessed                      General Comments General comments (skin integrity, edema, etc.): stump shrinker in place left residual limb    Exercises        Assessment/Plan    PT Assessment Patient needs continued PT services  PT  Diagnosis Difficulty walking;Other (comment) (difficulty transferring)   PT Problem List Decreased activity tolerance;Decreased balance;Decreased mobility;Decreased knowledge of use of DME  PT Treatment Interventions DME instruction;Functional mobility training;Therapeutic activities;Therapeutic exercise;Balance training;Patient/family education   PT Goals (Current goals can be found in the Care Plan section) Acute Rehab PT Goals Patient Stated Goal: home with assist of nephew for his recovery PT Goal Formulation: With patient Time For Goal Achievement: 01/13/15 Potential to Achieve Goals: Good    Frequency Min 3X/week   Barriers to discharge        Co-evaluation               End of Session Equipment Utilized During Treatment: Gait belt Activity Tolerance: Patient tolerated treatment well Patient left: in chair;with chair alarm set Nurse Communication: Mobility status         Time: 0947-0962 PT Time Calculation (min) (ACUTE ONLY): 21 min   Charges:   PT Evaluation $Initial PT Evaluation Tier I: 1 Procedure     PT G CodesLadona Ridgel 12/30/2014, 12:01 PM Gerlean Ren PT Acute Rehab Services [phone] [place] [phone]

## 2014-12-30 NOTE — Progress Notes (Signed)
Protocol assessment done on Pt.  Pt scored a severity of 1 but requests to remain on DuoNeb nebulizers scheduled q6H.

## 2014-12-30 NOTE — Plan of Care (Signed)
Problem: Phase I Progression Outcomes Goal: Discharge plan established Outcome: Completed/Met Date Met:  12/30/14 Plans to discharge to home where nephew lives with patient.  Denied difficulty getting to MD appts or difficulty getting medications.  Family members support transportation

## 2014-12-30 NOTE — Progress Notes (Signed)
[facility] PROGRESS NOTE  Tanner Pena WFU:[id number] DOB: [birth year] DOA: 12/28/2014 PCP: [name], MD  Assessment/Plan: Tanner Pena is a 74 y.o. 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 The patient is presenting with complaints of shortness of breath which is progressively worsening over last 2 days. Chest x ray showed: Cardiac enlargement with mild pulmonary vascular congestion. Increasing airspace disease in both lung bases since previous study. WBC at 14, BNP at 504.  1-Acute Respiratory Failure: Multifactorial, Heart Failure exacerbation, COPD.  Continue with nebulizer treatments, ceftriaxone. Schedule lasix IV BID.  Strep PNA negative, Influenza negative.  Legionella antigen negative. Sputum culture pending.  Day 3 antibiotics.   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 Lasix 40 mg IV bid.  Cardiology consulted and following. Negative 6 L.   3-History of Aortic Valve replacement with mechanical Valve:  Coumadin per pharmacy.   3-Diabetes: SSI.   4-Elevated troponin; cardiology consulted. Check echo with Ef 40%./ on Imdur, Plavix. coumadin No further ischemic work up planned.   5-Abdominal Pain; resolved. Will start Protonix. Abdominal exam normal.    Code Status: Full Code.  Family Communication: care discussed with family at bedside.  Disposition Plan: transfer to telemetry. Home in 24 to 48 hours.    Consultants:  Cardiology  Procedures:  none  Antibiotics:  Ceftriaxone 5-11  HPI/Subjective: Feeling much better. Breathing better. Cough improved.   Objective: Filed Vitals:   12/30/14 0746  BP: 145/45  Pulse: 53  Temp: 98.2 F (36.8 C)  Resp: 20    Intake/Output Summary (Last 24 hours) at  12/30/14 0815 Last data filed at 12/30/14 0700  Gross per 24 hour  Intake 114.67 ml  Output   1480 ml  Net -1365.33 ml   Filed Weights   12/28/14 1850 12/29/14 0416 12/30/14 0418  Weight: 95.5 kg (210 lb 8.6 oz) 94.5 kg (208 lb 5.4 oz) 92.3 kg (203 lb 7.8 oz)    Exam:   General:  Alert in no distress.   Cardiovascular: S 1, S 2 RRR  Respiratory: CTA  Abdomen: BS present, soft, nt  Musculoskeletal: BKA left LE, right lower extremity with no significant edema  Data Reviewed: Basic Metabolic Panel:  Recent Labs Lab 12/28/14 0134 12/28/14 0158 12/28/14 0622 12/29/14 0240 12/30/14 0230  NA 143 143  --  142 142  K 4.2 4.2  --  4.1 3.9  CL 106 107  --  101 99*  CO2 24  --   --  28 32  GLUCOSE 287* 282*  --  192* 211*  BUN 25* 29*  --  34* 36*  CREATININE 1.25* 1.20  --  1.19 1.14  CALCIUM 9.3  --   --  9.8 9.5  MG  --   --  2.0  --   --    Liver Function Tests:  Recent Labs Lab 12/28/14 0134  AST 26  ALT 13*  ALKPHOS 30*  BILITOT 0.8  PROT 6.6  ALBUMIN 3.5   No results for input(s): LIPASE, AMYLASE in the last 168 hours. No results for input(s): AMMONIA in the last 168 hours. CBC:  Recent Labs Lab 12/28/14 0134 12/28/14 0158  WBC 14.2*  --   NEUTROABS 9.7*  --   HGB 8.7* 10.5*  HCT 29.9* 31.0*  MCV 79.3  --   PLT  326  --    Cardiac Enzymes:  Recent Labs Lab 12/28/14 0134 12/28/14 0622 12/28/14 1143 12/28/14 2013  TROPONINI 0.05* 0.24* 0.57* 0.77*   BNP (last 3 results)  Recent Labs  12/28/14 0134  BNP 504.3*    ProBNP (last 3 results)  Recent Labs  01/19/14 2042 05/10/14 0229 05/11/14 1530  PROBNP 1373.0* 820.3* 1302.0*    CBG:  Recent Labs Lab 12/28/14 2148 12/29/14 0826 12/29/14 1253 12/29/14 1642 12/29/14 2216  GLUCAP 259* 176* 218* 168* 168*    Recent Results (from the past 240 hour(s))  MRSA PCR Screening     Status: None   Collection Time: 12/28/14  6:50 PM  Result Value Ref Range Status   MRSA by PCR  NEGATIVE NEGATIVE Final    Comment:        The GeneXpert MRSA Assay (FDA approved for NASAL specimens only), is one component of a comprehensive MRSA colonization surveillance program. It is not intended to diagnose MRSA infection nor to guide or monitor treatment for MRSA infections.   Culture, sputum-assessment     Status: None   Collection Time: 12/29/14 10:15 AM  Result Value Ref Range Status   Specimen Description SPU  Final   Special Requests NONE  Final   Sputum evaluation   Final    THIS SPECIMEN IS ACCEPTABLE. RESPIRATORY CULTURE REPORT TO FOLLOW.   Report Status 12/29/2014 FINAL  Final  Gram stain     Status: None   Collection Time: 12/29/14 10:15 AM  Result Value Ref Range Status   Specimen Description TRACHEAL SITE  Final   Special Requests NONE  Final   Gram Stain   Final    MODERATE WBC PRESENT, PREDOMINANTLY PMN ABUNDANT GRAM POSITIVE COCCI IN PAIRS IN CLUSTERS MODERATE GRAM NEGATIVE RODS FEW GRAM POSITIVE RODS FEW GRAM NEGATIVE COCCI IN DIPLOS    Report Status 12/29/2014 FINAL  Final  Culture, respiratory (NON-Expectorated)     Status: None (Preliminary result)   Collection Time: 12/29/14 10:15 AM  Result Value Ref Range Status   Specimen Description SPUTUM  Final   Special Requests NONE  Final   Gram Stain   Final    FEW WBC PRESENT,BOTH PMN AND MONONUCLEAR RARE SQUAMOUS EPITHELIAL CELLS PRESENT FEW GRAM POSITIVE COCCI IN PAIRS RARE GRAM POSITIVE COCCI IN CHAINS Performed at [facility]    Culture   Final    Culture reincubated for better growth Performed at [facility]    Report Status PENDING  Incomplete     Studies: No results found.  Scheduled Meds: . allopurinol  300 mg Oral QHS  . antiseptic oral rinse  7 mL Mouth Rinse BID  . cefTRIAXone (ROCEPHIN)  IV  1 g Intravenous Q24H  . clopidogrel  75 mg Oral Q breakfast  . furosemide  40 mg Intravenous Q12H  . insulin aspart  0-9 Units Subcutaneous TID WC  .  ipratropium-albuterol  3 mL Nebulization Q6H  . isosorbide mononitrate  30 mg Oral Daily  . metoprolol succinate  50 mg Oral Daily  . pantoprazole  40 mg Oral Daily  . pregabalin  50 mg Oral BID  . rosuvastatin  20 mg Oral QHS  . sodium chloride  3 mL Intravenous Q12H  . warfarin  5 mg Oral ONCE-1800  . Warfarin - Pharmacist Dosing Inpatient   Does not apply q1800   Continuous Infusions:    Principal Problem:   Acute respiratory failure Active Problems:   H/O aortic valve  replacement- [employer]   CAD - CABG '90 with re do '94    DM2 (diabetes mellitus, type 2)   Carotid stenosis   Acute on chronic combined systolic and diastolic CHF   Below knee amputation status   CAP (community acquired pneumonia)   COPD with exacerbation   SOB (shortness of breath)    Time spent: 35 minutes.     Tanner Pena A  [facility] Pager [phone]. If 7PM-7AM, please contact night-coverage at [email or web address], password [facility] 12/30/2014, 8:15 AM  LOS: 2 days

## 2014-12-30 NOTE — Evaluation (Signed)
Occupational Therapy Evaluation Patient Details Name: Tanner Pena MRN: [id number] DOB: [birth year] Today's Date: 12/30/2014    History of Present Illness Pt. is a 74 yo male admitted 12/28/14 with SOB, found to have acute respiratory failure/heart failure exacerbation/COPD.  Pt. with h/o aortic valve replacement with mechanical valve, CAD, DM, severe acute on chronic congestive heart failure, PVD.  With h/o L BKA.  He states he is currently in the process of getting a prosthesis, and has had the first impression made.     Clinical Impression   PTA pt lived at home and was independent with ADLs and functional transfers. He was not ambulatory and is hopeful to get a prosthesis soon for L BKA. Pt currently at [employer] level for ADLS and is likely at or near baseline. No further acute OT needs.     Follow Up Recommendations  No OT follow up    Equipment Recommendations  None recommended by OT    Recommendations for Other Services       Precautions / Restrictions Precautions Precautions: Fall Restrictions Weight Bearing Restrictions: No      Mobility Bed Mobility Overal bed mobility: Modified Independent                Transfers Overall transfer level: Needs assistance Equipment used: None Transfers: Squat Pivot Transfers     Squat pivot transfers: Min guard     General transfer comment: min guard for safety. Likely at or close to baseline.          ADL Overall ADL's : Needs assistance/impaired;At baseline                                       General ADL Comments: Pt at Supervision/Min guard level for ADLs but likely at or near baseline. Educated pt on fall prevention and safety.      Vision Additional Comments: No change from baseline   Perception Perception Perception Tested?: No   Praxis Praxis Praxis tested?: Not tested    Pertinent Vitals/Pain Pain Assessment: No/denies pain        Extremity/Trunk  Assessment Upper Extremity Assessment Upper Extremity Assessment: Overall WFL for tasks assessed   Lower Extremity Assessment Lower Extremity Assessment: Overall WFL for tasks assessed   Cervical / Trunk Assessment Cervical / Trunk Assessment: Normal   Communication Communication Communication: No difficulties   Cognition Arousal/Alertness: Awake/alert Behavior During Therapy: WFL for tasks assessed/performed Overall Cognitive Status: Within Functional Limits for tasks assessed                                Home Living Family/patient expects to be discharged to:: Private residence Living Arrangements: Other relatives (nephew) Available Help at Discharge: Available 24 hours/day;Family Type of Home: House Home Access: Stairs to enter;Ramped entrance     Home Layout: One level     Bathroom Shower/Tub: Tub/shower unit Shower/tub characteristics: Architectural technologist: Standard     Home Equipment: Wheelchair - Water quality scientist - 2 wheels;Hand held shower head;Shower seat          Prior Functioning/Environment Level of Independence: Independent with assistive device(s)        Comments: was able to transfer to<>from w/c independently PTA; did very little walking due to his shoulders (old surgery right shoulder per pt.)    OT Diagnosis: Generalized weakness  End of Session  Activity Tolerance: Patient tolerated treatment well Patient left: Other (comment);with nursing/sitter in room (sitting EOB)   Time: 6387-5643 OT Time Calculation (min): 12 min Charges:  OT General Charges $OT Visit: 1 Procedure OT Evaluation $Initial OT Evaluation Tier I: 1 Procedure G-Codes:    Juluis Rainier 2015/01/18, 5:50 PM  Cyndie Chime, OTR/L Occupational Therapist [phone] (pager)

## 2014-12-30 NOTE — Plan of Care (Signed)
Problem: Phase I Progression Outcomes Goal: Flu/PneumoVaccines if indicated Outcome: Not Applicable Date Met:  10/93/23 Current with vaccines

## 2014-12-30 NOTE — Plan of Care (Signed)
Problem: Phase I Progression Outcomes Goal: Progress activity as tolerated unless otherwise ordered Outcome: Completed/Met Date Met:  12/30/14 Up in chair today with PT and tolerated well.  Assisted back to bed using walker and standby assistance.

## 2014-12-30 NOTE — Progress Notes (Signed)
Deloris Karlton Lemon transferred to room 3E22.  No c/o pain.  Skin warm and dry.  Upon inspection of RLE where foam dressing applied to heel and achilles areas, no wound observed and pt requested foam dressings be left off.  Stated he hit back of leg and only tore top layer of skin when he hit leg.  Ace wrap "shrinker" to left BKA - skin dry and intact.  Bed alarm turned on and pt instructed to call for assistance to get out of bed.  Stated understanding

## 2014-12-30 NOTE — Progress Notes (Signed)
02 titrated off and 02 sat = 93%

## 2014-12-31 LAB — BASIC METABOLIC PANEL
ANION GAP: 10 (ref 5–15)
BUN: 30 mg/dL — ABNORMAL HIGH (ref 6–20)
CALCIUM: 9.4 mg/dL (ref 8.9–10.3)
CHLORIDE: 100 mmol/L — AB (ref 101–111)
CO2: 30 mmol/L (ref 22–32)
Creatinine, Ser: 1.09 mg/dL (ref 0.61–1.24)
GFR calc Af Amer: >60 mL/min (ref >60)
Glucose, Bld: 199 mg/dL — ABNORMAL HIGH (ref 65–99)
Potassium: 3.6 mmol/L (ref 3.5–5.1)
SODIUM: 140 mmol/L (ref 135–145)

## 2014-12-31 LAB — PROTIME-INR
INR: 2.55 — AB (ref 0.00–1.49)
Prothrombin Time: 27.7 seconds — ABNORMAL HIGH (ref 11.6–15.2)

## 2014-12-31 LAB — GLUCOSE, CAPILLARY
GLUCOSE-CAPILLARY: 188 mg/dL — AB (ref 65–99)
GLUCOSE-CAPILLARY: 192 mg/dL — AB (ref 65–99)
GLUCOSE-CAPILLARY: 167 mg/dL — AB (ref 65–99)
Glucose-Capillary: 254 mg/dL — ABNORMAL HIGH (ref 65–99)

## 2014-12-31 LAB — CBC
HEMATOCRIT: 27.8 % — AB (ref 39.0–52.0)
Hemoglobin: 8.1 g/dL — ABNORMAL LOW (ref 13.0–17.0)
MCH: 22.1 pg — AB (ref 26.0–34.0)
MCHC: 29.1 g/dL — AB (ref 30.0–36.0)
MCV: 76 fL — AB (ref 78.0–100.0)
PLATELETS: 282 10*3/uL (ref 150–400)
RBC: 3.66 MIL/uL — ABNORMAL LOW (ref 4.22–5.81)
RDW: 18.3 % — ABNORMAL HIGH (ref 11.5–15.5)
WBC: 10 10*3/uL (ref 4.0–10.5)

## 2014-12-31 LAB — CULTURE, RESPIRATORY W GRAM STAIN: Culture: NORMAL

## 2014-12-31 LAB — CULTURE, RESPIRATORY

## 2014-12-31 MED ORDER — IPRATROPIUM-ALBUTEROL 0.5-2.5 (3) MG/3ML IN SOLN
3.0000 mL | RESPIRATORY_TRACT | Status: DC | PRN
  Administered 2014-12-31: 3 mL via RESPIRATORY_TRACT
  Filled 2014-12-31: qty 3

## 2014-12-31 MED ORDER — FUROSEMIDE 40 MG PO TABS
40.0000 mg | ORAL_TABLET | Freq: Two times a day (BID) | ORAL | Status: DC
  Administered 2014-12-31 – 2015-01-01 (×3): 40 mg via ORAL
  Filled 2014-12-31 (×5): qty 1

## 2014-12-31 MED ORDER — WARFARIN SODIUM 10 MG PO TABS
10.0000 mg | ORAL_TABLET | Freq: Once | ORAL | Status: AC
  Administered 2014-12-31: 10 mg via ORAL
  Filled 2014-12-31: qty 1

## 2014-12-31 NOTE — Progress Notes (Signed)
[facility] PROGRESS NOTE  Tanner Pena YBO:[id number] DOB: [birth year] DOA: 12/28/2014 PCP: [name], MD  Assessment/Plan: Tanner Pena is a 74 y.o. 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 The patient is presenting with complaints of shortness of breath which is progressively worsening over last 2 days. Chest x ray showed: Cardiac enlargement with mild pulmonary vascular congestion. Increasing airspace disease in both lung bases since previous study. WBC at 14, BNP at 504.  1-Acute Respiratory Failure: Multifactorial, Heart Failure exacerbation, COPD.  Continue with nebulizer treatments, ceftriaxone.  Strep PNA negative, Influenza negative.  Legionella antigen negative. Sputum culture pending.  Day 4 antibiotics.   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 Will change lasix to 40 Mg BID.  Cardiology consulted and following. Negative 7 L.  Weight:95 ------91  3-History of Aortic Valve replacement with mechanical Valve:  Coumadin per pharmacy.   3-Diabetes: SSI.   4-Elevated troponin; cardiology consulted. Check echo with Ef 40%./ on Imdur, Plavix. coumadin No further ischemic work up planned.   5-Abdominal Pain; resolved. Will start Protonix. Abdominal exam normal.    Code Status: Full Code.  Family Communication: care discussed with family at bedside.  Disposition Plan: awaiting sputum culture results, plan to discharge 5-15.    Consultants:  Cardiology  Procedures:  none  Antibiotics:  Ceftriaxone 5-11  HPI/Subjective: He is feeling better. Breathing at baseline.   Objective: Filed Vitals:   12/31/14 0627  BP: 120/42  Pulse: 58  Temp: 97.7 F (36.5 C)  Resp: 18    Intake/Output Summary (Last 24 hours)  at 12/31/14 0914 Last data filed at 12/31/14 0902  Gross per 24 hour  Intake   1130 ml  Output   2050 ml  Net   -920 ml   Filed Weights   12/30/14 0418 12/30/14 0958 12/31/14 0627  Weight: 92.3 kg (203 lb 7.8 oz) 93.7 kg (206 lb 9.1 oz) 91.581 kg (201 lb 14.4 oz)    Exam:   General:  Alert in no distress.   Cardiovascular: S 1, S 2 RRR  Respiratory: CTA  Abdomen: BS present, soft, nt  Musculoskeletal: BKA left LE, right lower extremity with no significant edema  Data Reviewed: Basic Metabolic Panel:  Recent Labs Lab 12/28/14 0134 12/28/14 0158 12/28/14 0622 12/29/14 0240 12/30/14 0230 12/31/14 0305  NA 143 143  --  142 142 140  K 4.2 4.2  --  4.1 3.9 3.6  CL 106 107  --  101 99* 100*  CO2 24  --   --  28 32 30  GLUCOSE 287* 282*  --  192* 211* 199*  BUN 25* 29*  --  34* 36* 30*  CREATININE 1.25* 1.20  --  1.19 1.14 1.09  CALCIUM 9.3  --   --  9.8 9.5 9.4  MG  --   --  2.0  --   --   --    Liver Function Tests:  Recent Labs Lab 12/28/14 0134  AST 26  ALT 13*  ALKPHOS 30*  BILITOT 0.8  PROT 6.6  ALBUMIN 3.5   No results for input(s): LIPASE, AMYLASE in the last 168 hours. No results for input(s): AMMONIA in the last 168 hours. CBC:  Recent Labs Lab 12/28/14 0134 12/28/14 0158 12/30/14 0941 12/31/14 0305  WBC 14.2*  --  11.2* 10.0  NEUTROABS 9.7*  --   --   --   HGB 8.7* 10.5* 8.4* 8.1*  HCT 29.9* 31.0* 28.8* 27.8*  MCV 79.3  --  77.2* 76.0*  PLT 326  --  296 282   Cardiac Enzymes:  Recent Labs Lab 12/28/14 0134 12/28/14 0622 12/28/14 1143 12/28/14 2013  TROPONINI 0.05* 0.24* 0.57* 0.77*   BNP (last 3 results)  Recent Labs  12/28/14 0134  BNP 504.3*    ProBNP (last 3 results)  Recent Labs  01/19/14 2042 05/10/14 0229 05/11/14 1530  PROBNP 1373.0* 820.3* 1302.0*    CBG:  Recent Labs Lab 12/30/14 0745 12/30/14 1112 12/30/14 1607 12/30/14 2144 12/31/14 0648  GLUCAP 188* 209* 211* 150* 188*    Recent Results  (from the past 240 hour(s))  MRSA PCR Screening     Status: None   Collection Time: 12/28/14  6:50 PM  Result Value Ref Range Status   MRSA by PCR NEGATIVE NEGATIVE Final    Comment:        The GeneXpert MRSA Assay (FDA approved for NASAL specimens only), is one component of a comprehensive MRSA colonization surveillance program. It is not intended to diagnose MRSA infection nor to guide or monitor treatment for MRSA infections.   Culture, sputum-assessment     Status: None   Collection Time: 12/29/14 10:15 AM  Result Value Ref Range Status   Specimen Description SPU  Final   Special Requests NONE  Final   Sputum evaluation   Final    THIS SPECIMEN IS ACCEPTABLE. RESPIRATORY CULTURE REPORT TO FOLLOW.   Report Status 12/29/2014 FINAL  Final  Gram stain     Status: None   Collection Time: 12/29/14 10:15 AM  Result Value Ref Range Status   Specimen Description TRACHEAL SITE  Final   Special Requests NONE  Final   Gram Stain   Final    MODERATE WBC PRESENT, PREDOMINANTLY PMN ABUNDANT GRAM POSITIVE COCCI IN PAIRS IN CLUSTERS MODERATE GRAM NEGATIVE RODS FEW GRAM POSITIVE RODS FEW GRAM NEGATIVE COCCI IN DIPLOS    Report Status 12/29/2014 FINAL  Final  Culture, respiratory (NON-Expectorated)     Status: None (Preliminary result)   Collection Time: 12/29/14 10:15 AM  Result Value Ref Range Status   Specimen Description SPUTUM  Final   Special Requests NONE  Final   Gram Stain   Final    FEW WBC PRESENT,BOTH PMN AND MONONUCLEAR RARE SQUAMOUS EPITHELIAL CELLS PRESENT FEW GRAM POSITIVE COCCI IN PAIRS RARE GRAM POSITIVE COCCI IN CHAINS RARE GRAM NEGATIVE RODS    Culture   Final    Culture reincubated for better growth Performed at [facility]    Report Status PENDING  Incomplete     Studies: No results found.  Scheduled Meds: . allopurinol  300 mg Oral QHS  . antiseptic oral rinse  7 mL Mouth Rinse BID  . cefTRIAXone (ROCEPHIN)  IV  1 g Intravenous Q24H  .  clopidogrel  75 mg Oral Q breakfast  . furosemide  40 mg Oral BID  . insulin aspart  0-9 Units Subcutaneous TID WC  . isosorbide mononitrate  30 mg Oral Daily  . metoprolol succinate  50 mg Oral Daily  . pantoprazole  40 mg Oral Daily  . pregabalin  50 mg Oral BID  . rosuvastatin  20 mg Oral QHS  . sodium chloride  3 mL Intravenous Q12H  . warfarin  10 mg Oral ONCE-1800  . Warfarin - Pharmacist Dosing Inpatient  Does not apply q1800   Continuous Infusions:    Principal Problem:   Acute respiratory failure Active Problems:   H/O aortic valve replacement- St Jude   CAD - CABG '90 with re do '94    DM2 (diabetes mellitus, type 2)   Carotid stenosis   Acute on chronic combined systolic and diastolic CHF   Below knee amputation status   CAP (community acquired pneumonia)   COPD with exacerbation   SOB (shortness of breath)    Time spent: 25 minutes.     Niel Hummer A  [facility] Pager [phone]. If 7PM-7AM, please contact night-coverage at [email or web address], password [facility] 12/31/2014, 9:14 AM  LOS: 3 days

## 2014-12-31 NOTE — Progress Notes (Signed)
ANTICOAGULATION CONSULT NOTE - Follow Up Consult  Pharmacy Consult for Coumadin Indication: mechanical AVR  No Known Allergies  Patient Measurements: Height: '5\' 10"'$  (177.8 cm) Weight: 201 lb 14.4 oz (91.581 kg) (Scale B) IBW/kg (Calculated) : 73  Vital Signs: Temp: 97.7 F (36.5 C) (05/14 0627) Temp Source: Oral (05/14 0627) BP: 120/42 mmHg (05/14 0627) Pulse Rate: 58 (05/14 0627)  Labs:  Recent Labs  12/28/14 1143 12/28/14 2013 12/29/14 0240 12/30/14 0230 12/30/14 0941 12/31/14 0305  HGB  --   --   --   --  8.4* 8.1*  HCT  --   --   --   --  28.8* 27.8*  PLT  --   --   --   --  296 282  LABPROT  --   --  37.6* 35.4*  --  27.7*  INR  --   --  3.78* 3.51*  --  2.55*  CREATININE  --   --  1.19 1.14  --  1.09  TROPONINI 0.57* 0.77*  --   --   --   --     Estimated Creatinine Clearance: 67.6 mL/min (by C-G formula based on Cr of 1.09).  Assessment: 74yom continues on coumadin for his mechanical AVR. INR is therapeutic but has decreased significantly from yesterday (could be due to held dose on 5/12). Hemoglobin is low but stable, platelets ok. No bleeding reported.  Home dose: '5mg'$  daily except 7.'5mg'$  on Fridays  Goal of Therapy:  INR 2.5-3.5 Monitor platelets by anticoagulation protocol: Yes   Plan:  1) Increase coumadin to '10mg'$  x 1 2) Daily INR  Deboraha Sprang 12/31/2014,8:00 AM

## 2014-12-31 NOTE — Progress Notes (Signed)
SUBJECTIVE:  Feels better. Denies SOB.  [name] allopurinol  300 mg Oral QHS  . antiseptic oral rinse  7 mL Mouth Rinse BID  . cefTRIAXone (ROCEPHIN)  IV  1 g Intravenous Q24H  . clopidogrel  75 mg Oral Q breakfast  . furosemide  40 mg Oral BID  . insulin aspart  0-9 Units Subcutaneous TID WC  . isosorbide mononitrate  30 mg Oral Daily  . metoprolol succinate  50 mg Oral Daily  . pantoprazole  40 mg Oral Daily  . pregabalin  50 mg Oral BID  . rosuvastatin  20 mg Oral QHS  . sodium chloride  3 mL Intravenous Q12H  . warfarin  10 mg Oral ONCE-1800  . Warfarin - Pharmacist Dosing Inpatient   Does not apply q1800   OBJECTIVE:    Vitals:   Filed Vitals:   12/30/14 2146 12/31/14 0252 12/31/14 0627 12/31/14 0900  BP: 150/51  120/42 145/46  Pulse: 62 59 58 71  Temp: 97.8 F (36.6 C)  97.7 F (36.5 C) 98.3 F (36.8 C)  TempSrc: Oral  Oral Oral  Resp: '20 18 18 18  '$ Height:      Weight:   201 lb 14.4 oz (91.581 kg)   SpO2: 98%  98% 98%   I&O's:    Intake/Output Summary (Last 24 hours) at 12/31/14 1045 Last data filed at 12/31/14 0902  Gross per 24 hour  Intake   1130 ml  Output   2050 ml  Net   -920 ml   TELEMETRY: Reviewed telemetry pt in NSR, PVCs:  PHYSICAL EXAM General: Well developed, well nourished, in no acute distress Head:   Normal cephalic and atramatic  Lungs: Wheezing bilaterally to auscultation. Heart:   HRRR S1 crisp S2  click Abdomen: abdomen soft and non-tender Msk:  Back normal,  Normal strength and tone for age. Extremities:   No edema on the right LE.  Neuro: Alert and oriented. Psych:  Normal affect, responds appropriately Skin: No rash  LABS:  Basic Metabolic Panel:  Recent Labs  12/30/14 0230 12/31/14 0305  NA 142 140  K 3.9 3.6  CL 99* 100*  CO2 32 30  GLUCOSE 211* 199*  BUN 36* 30*  CREATININE 1.14 1.09  CALCIUM 9.5 9.4   CBC:  Recent Labs  12/30/14 0941 12/31/14 0305  WBC 11.2* 10.0  HGB 8.4* 8.1*  HCT 28.8* 27.8*  MCV  77.2* 76.0*  PLT 296 282   Cardiac Enzymes:  Recent Labs  12/28/14 1143 12/28/14 2013  TROPONINI 0.57* 0.77*   Coag Panel:   Lab Results  Component Value Date   INR 2.55* 12/31/2014   INR 3.51* 12/30/2014   INR 3.78* 12/29/2014   RADIOLOGY: Dg Chest Port 1 View  12/28/2014   CLINICAL DATA:  Mid chest pain and shortness of breath tonight.  EXAM: PORTABLE CHEST - 1 VIEW  COMPARISON:  05/12/2014  FINDINGS: Postoperative changes in the mediastinum. Interval removal of endotracheal and enteric tubes. Cardiac enlargement with mild pulmonary vascular congestion. Increasing airspace disease in the lung bases may be due to edema or pneumonia. No definite blunting of costophrenic angles. No pneumothorax. Old resection or resorption of the distal right clavicle. Calcification of the aorta.  IMPRESSION: Cardiac enlargement with mild pulmonary vascular congestion. Increasing airspace disease in both lung bases since previous study.   Electronically Signed   By: Lucienne Capers M.D.   On: 12/28/2014 02:06      ASSESSMENT: CAD, elevated troponin, SHOB,  AVR  PLAN:  Elevated troponin likely due to heart failure. Continue with diuresis, agree with PO lasix 40 mg BID, he appears euvolemic now, he was on lasix 40 mg po daily prior to the admission, if he continues to have negative fluid balance on lasix BID can switch to 40 mg po daily. Discharge probably tomorrow.   Coumadin for prosthetic aortic valve.  Dorothy Spark, MD  12/31/2014  10:45 AM

## 2015-01-01 LAB — GLUCOSE, CAPILLARY
Glucose-Capillary: 252 mg/dL — ABNORMAL HIGH (ref 65–99)
Glucose-Capillary: 202 mg/dL — ABNORMAL HIGH (ref 65–99)

## 2015-01-01 LAB — PROTIME-INR
INR: 2.33 — AB (ref 0.00–1.49)
PROTHROMBIN TIME: 25.7 s — AB (ref 11.6–15.2)

## 2015-01-01 MED ORDER — PANTOPRAZOLE SODIUM 40 MG PO TBEC: 40.0000 mg | DELAYED_RELEASE_TABLET | Freq: Every day | ORAL | Status: DC

## 2015-01-01 MED ORDER — CEPHALEXIN 500 MG PO CAPS: 500.0000 mg | ORAL_CAPSULE | Freq: Four times a day (QID) | ORAL | Status: DC

## 2015-01-01 MED ORDER — WARFARIN SODIUM 5 MG PO TABS: 5.0000 mg | ORAL_TABLET | Freq: Every day | ORAL | Status: DC

## 2015-01-01 MED ORDER — WARFARIN SODIUM 10 MG PO TABS
10.0000 mg | ORAL_TABLET | ORAL | Status: DC
  Filled 2015-01-01 (×2): qty 1

## 2015-01-01 NOTE — Discharge Summary (Signed)
Physician Discharge Summary  Tanner Pena YKZ:[id number] DOB: 03-11-1941 DOA: 12/28/2014  PCP: [name], MD  Admit date: 12/28/2014 Discharge date: 01/01/2015  Time spent: 35 minutes  Recommendations for Outpatient Follow-up:  1. Needs cbc, and further evaluation of anemia 2. Needs renal function  3. Follow up with cardiology for heart failure.   Discharge Diagnoses:    Acute respiratory failure   H/O aortic valve replacement- St Jude   CAD - CABG '90 with re do '94    DM2 (diabetes mellitus, type 2)   Carotid stenosis   Acute on chronic combined systolic and diastolic CHF   Below knee amputation status   CAP (community acquired pneumonia)   COPD with exacerbation   SOB (shortness of breath)   Discharge Condition: Stable.   Diet recommendation: heart healthy  Filed Weights   12/30/14 0958 12/31/14 0627 01/01/15 0556  Weight: 93.7 kg (206 lb 9.1 oz) 91.581 kg (201 lb 14.4 oz) 91.536 kg (201 lb 12.8 oz)    History of present illness:  Tanner Pena is a 74 y.o. 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 The patient is presenting with complaints of shortness of breath which is progressively worsening over last 2 days. He has orthopnea and PND which is chronic. He has chronic swelling of the right leg. He denies any chest pain at the time of my evaluation but did have some chest tightness earlier. He has cough with yellow expectoration. He has fever as well as chills. Denies any sick contact or recent travel or recent hospitalization. He mentions he is compliant with all his medications. His earlier weight in February was 202 pounds today he is 211 pounds. He denies any diarrhea or constipation or burning urination. Denies any recent change in his  medications.  The patient is coming from home And at his baseline independent for most of his ADL  Hospital Course:  Tanner Pena is a 74 y.o. 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 The patient is presenting with complaints of shortness of breath which is progressively worsening over last 2 days. Chest x ray showed: Cardiac enlargement with mild pulmonary vascular congestion. Increasing airspace disease in both lung bases since previous study. WBC at 14, BNP at 504.  1-Acute Respiratory Failure: Multifactorial, Heart Failure exacerbation, COPD.  Received  nebulizer treatments, 5 days of ceftriaxone.  Strep PNA negative, Influenza negative.  Legionella antigen negative. Sputum culture pending.  Day 5 antibiotics. Discharge on keflex for 2 more days.   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 He will be discharge on  lasix to 40 Mg daily Cardiology consulted and following. Negative 7 L.  Weight:95 ------91---91  3-History of Aortic Valve replacement with mechanical Valve:  Coumadin per pharmacy. Will give 10 mg tonight. Then resume home dose.   3-Diabetes: SSI.   4-Elevated troponin; cardiology consulted. Check echo with Ef 40%./ on Imdur, Plavix. coumadin No further ischemic work up planned.   5-Abdominal Pain; resolved. Will start Protonix. Abdominal exam normal.   Procedures: none Consultations:  cardiology  Discharge Exam: Filed Vitals:   01/01/15 0556  BP: 129/49  Pulse: 58  Temp: 98.1 F (36.7 C)  Resp: 20    General: Alert in no distress.  Cardiovascular: S 1, S 2 RRR Respiratory: CTA  Discharge Instructions   Discharge Instructions    Diet - low sodium heart healthy    Complete by:  As directed      Increase  activity slowly    Complete by:  As directed           Discharge Medication List as of 01/01/2015  9:56 AM    START taking these medications   Details  pantoprazole (PROTONIX) 40 MG tablet Take 1 tablet (40 mg total) by mouth daily., Starting 01/01/2015, Until Discontinued, Print      CONTINUE these medications which have CHANGED   Details  cephALEXin (KEFLEX) 500 MG capsule Take 1 capsule (500 mg total) by mouth 4 (four) times daily., Starting 01/01/2015, Until Discontinued, Print    warfarin (COUMADIN) 5 MG tablet Take 1-1.5 tablets (5-7.5 mg total) by mouth at bedtime. Takes one-half tablet (7.'5mg'$  total) on Fridays. Takes one tablet ('5mg'$  total) on all other days, Starting 01/01/2015, Until Discontinued, Print      CONTINUE these medications which have NOT CHANGED   Details  albuterol (VOSPIRE ER) 4 MG 12 hr tablet Take 4 mg by mouth 2 (two) times daily. , Until Discontinued, Historical Med    albuterol-ipratropium (COMBIVENT) 18-103 MCG/ACT inhaler Inhale 1 puff into the lungs 4 (four) times daily. Coughing/ Shortness of Breath, Until Discontinued, Historical Med    allopurinol (ZYLOPRIM) 300 MG tablet Take 300 mg by mouth at bedtime. , Until Discontinued, Historical Med    Ascorbic Acid (VITAMIN C WITH ROSE HIPS) 1000 MG tablet Take 1,000 mg by mouth daily., Until Discontinued, Historical Med    cholecalciferol (VITAMIN D) 1000 UNITS tablet Take 2,000 Units by mouth daily., Until Discontinued, Historical Med    clopidogrel (PLAVIX) 75 MG tablet Take 1 tablet (75 mg total) by mouth daily with breakfast., Starting 05/17/2014, Until Discontinued, Normal    fenofibrate (TRICOR) 145 MG tablet Take 1 tablet (145 mg total) by mouth daily., Starting 02/24/2014, Until Discontinued, Normal    fish oil-omega-3 fatty acids 1000 MG capsule Take 1 capsule (1 g total) by mouth 2 (two) times daily., Starting 01/22/2013, Until Discontinued, OTC    furosemide (LASIX) 80 MG tablet Take 40 mg by mouth  daily., Until Discontinued, Historical Med    glimepiride (AMARYL) 1 MG tablet Take 1 mg by mouth 2 (two) times daily., Until Discontinued, Historical Med    isosorbide mononitrate (IMDUR) 30 MG 24 hr tablet TAKE ONE TABLET BY MOUTH ONCE DAILY., Normal    metoprolol succinate (TOPROL-XL) 50 MG 24 hr tablet Take 1 tablet (50 mg total) by mouth daily. Take with or immediately following a meal., Starting 05/17/2014, Until Discontinued, Normal    nitroGLYCERIN (NITROSTAT) 0.4 MG SL tablet Place 1 tablet (0.4 mg total) under the tongue every 5 (five) minutes as needed for chest pain., Starting 05/17/2014, Until Discontinued, Normal    oxyCODONE-acetaminophen (PERCOCET) 10-325 MG per tablet Take 1 tablet by mouth every 4 (four) hours as needed for pain., Starting 09/23/2014, Until Discontinued, Print    potassium chloride SA (K-DUR,KLOR-CON) 20 MEQ tablet Take 20 mEq by mouth daily.  , Until Discontinued, Historical Med    pregabalin (LYRICA) 50 MG capsule Take one capsule by mouth twice daily for pains, Print    ramipril (ALTACE) 2.5 MG capsule TAKE 1 CAPSULE BY MOUTH AT BEDTIME., Normal    rosuvastatin (CRESTOR) 20 MG tablet Take 20  mg by mouth at bedtime., Until Discontinued, Historical Med    sitaGLIPtin (JANUVIA) 50 MG tablet Take 50 mg by mouth daily., Until Discontinued, Historical Med    zinc gluconate 50 MG tablet Take 50 mg by mouth daily., Until Discontinued, Historical Med    acetaminophen (TYLENOL) 500 MG tablet Take 500 mg by mouth every 6 (six) hours as needed for moderate pain., Until Discontinued, Historical Med      STOP taking these medications     aspirin EC 325 MG tablet      methocarbamol (ROBAXIN) 500 MG tablet      nicotine (NICODERM CQ - DOSED IN MG/24 HOURS) 14 mg/24hr patch      oxyCODONE-acetaminophen (ROXICET) 5-325 MG per tablet        No Known Allergies Follow-up Information    Follow up with [facility] On 01/10/2015.   Specialty:   Family Medicine   Why:  Tuesday @ 1:30 pm with Dr. Alita Chyle with [facility]   Contact information:   [place] [address] [phone]       Follow up with Quay Burow, MD In 1 week.   Specialty:  Cardiology   Contact information:   [address] [place] [address] [address] [phone]        The results of significant diagnostics from this hospitalization (including imaging, microbiology, ancillary and laboratory) are listed below for reference.    Significant Diagnostic Studies: Dg Chest Port 1 View  12/28/2014   CLINICAL DATA:  Mid chest pain and shortness of breath tonight.  EXAM: PORTABLE CHEST - 1 VIEW  COMPARISON:  05/12/2014  FINDINGS: Postoperative changes in the mediastinum. Interval removal of endotracheal and enteric tubes. Cardiac enlargement with mild pulmonary vascular congestion. Increasing airspace disease in the lung bases may be due to edema or pneumonia. No definite blunting of costophrenic angles. No pneumothorax. Old resection or resorption of the distal right clavicle. Calcification of the aorta.  IMPRESSION: Cardiac enlargement with mild pulmonary vascular congestion. Increasing airspace disease in both lung bases since previous study.   Electronically Signed   By: Lucienne Capers M.D.   On: 12/28/2014 02:06    Microbiology: Recent Results (from the past 240 hour(s))  MRSA PCR Screening     Status: None   Collection Time: 12/28/14  6:50 PM  Result Value Ref Range Status   MRSA by PCR NEGATIVE NEGATIVE Final    Comment:        The GeneXpert MRSA Assay (FDA approved for NASAL specimens only), is one component of a comprehensive MRSA colonization surveillance program. It is not intended to diagnose MRSA infection nor to guide or monitor treatment for MRSA infections.   Culture, sputum-assessment     Status: None   Collection Time: 12/29/14 10:15 AM  Result Value Ref Range Status   Specimen Description SPU  Final    Special Requests NONE  Final   Sputum evaluation   Final    THIS SPECIMEN IS ACCEPTABLE. RESPIRATORY CULTURE REPORT TO FOLLOW.   Report Status 12/29/2014 FINAL  Final  Gram stain     Status: None   Collection Time: 12/29/14 10:15 AM  Result Value Ref Range Status   Specimen Description TRACHEAL SITE  Final   Special Requests NONE  Final   Gram Stain   Final    MODERATE WBC PRESENT, PREDOMINANTLY PMN ABUNDANT GRAM POSITIVE COCCI IN PAIRS IN CLUSTERS MODERATE GRAM NEGATIVE RODS FEW GRAM POSITIVE RODS FEW  GRAM NEGATIVE COCCI IN DIPLOS    Report Status 12/29/2014 FINAL  Final  Culture, respiratory (NON-Expectorated)     Status: None   Collection Time: 12/29/14 10:15 AM  Result Value Ref Range Status   Specimen Description SPUTUM  Final   Special Requests NONE  Final   Gram Stain   Final    FEW WBC PRESENT,BOTH PMN AND MONONUCLEAR RARE SQUAMOUS EPITHELIAL CELLS PRESENT FEW GRAM POSITIVE COCCI IN PAIRS RARE GRAM POSITIVE COCCI IN CHAINS RARE GRAM NEGATIVE RODS    Culture   Final    NORMAL OROPHARYNGEAL FLORA Performed at [facility]    Report Status 12/31/2014 FINAL  Final     Labs: Basic Metabolic Panel:  Recent Labs Lab 12/28/14 0134 12/28/14 0158 12/28/14 0622 12/29/14 0240 12/30/14 0230 12/31/14 0305  NA 143 143  --  142 142 140  K 4.2 4.2  --  4.1 3.9 3.6  CL 106 107  --  101 99* 100*  CO2 24  --   --  28 32 30  GLUCOSE 287* 282*  --  192* 211* 199*  BUN 25* 29*  --  34* 36* 30*  CREATININE 1.25* 1.20  --  1.19 1.14 1.09  CALCIUM 9.3  --   --  9.8 9.5 9.4  MG  --   --  2.0  --   --   --    Liver Function Tests:  Recent Labs Lab 12/28/14 0134  AST 26  ALT 13*  ALKPHOS 30*  BILITOT 0.8  PROT 6.6  ALBUMIN 3.5   No results for input(s): LIPASE, AMYLASE in the last 168 hours. No results for input(s): AMMONIA in the last 168 hours. CBC:  Recent Labs Lab 12/28/14 0134 12/28/14 0158 12/30/14 0941 12/31/14 0305  WBC 14.2*  --  11.2*  10.0  NEUTROABS 9.7*  --   --   --   HGB 8.7* 10.5* 8.4* 8.1*  HCT 29.9* 31.0* 28.8* 27.8*  MCV 79.3  --  77.2* 76.0*  PLT 326  --  296 282   Cardiac Enzymes:  Recent Labs Lab 12/28/14 0134 12/28/14 0622 12/28/14 1143 12/28/14 2013  TROPONINI 0.05* 0.24* 0.57* 0.77*   BNP: BNP (last 3 results)  Recent Labs  12/28/14 0134  BNP 504.3*    ProBNP (last 3 results)  Recent Labs  01/19/14 2042 05/10/14 0229 05/11/14 1530  PROBNP 1373.0* 820.3* 1302.0*    CBG:  Recent Labs Lab 12/31/14 1121 12/31/14 1643 12/31/14 2101 01/01/15 0602 01/01/15 1200  GLUCAP 254* 192* 167* 252* 202*       Signed:  Lai Hendriks A  [facility] 01/01/2015, 7:37 PM

## 2015-01-01 NOTE — Progress Notes (Addendum)
SUBJECTIVE:  Feels better. Denies SOB or chest pain.   [name] allopurinol  300 mg Oral QHS  . antiseptic oral rinse  7 mL Mouth Rinse BID  . cefTRIAXone (ROCEPHIN)  IV  1 g Intravenous Q24H  . clopidogrel  75 mg Oral Q breakfast  . furosemide  40 mg Oral BID  . insulin aspart  0-9 Units Subcutaneous TID WC  . isosorbide mononitrate  30 mg Oral Daily  . metoprolol succinate  50 mg Oral Daily  . pantoprazole  40 mg Oral Daily  . pregabalin  50 mg Oral BID  . rosuvastatin  20 mg Oral QHS  . sodium chloride  3 mL Intravenous Q12H  . warfarin  10 mg Oral NOW  . Warfarin - Pharmacist Dosing Inpatient   Does not apply q1800   OBJECTIVE:    Vitals:   Filed Vitals:   12/31/14 1358 12/31/14 1508 12/31/14 2058 01/01/15 0556  BP:  119/56 142/47 129/49  Pulse:  68 64 58  Temp:  99.1 F (37.3 C) 98 F (36.7 C) 98.1 F (36.7 C)  TempSrc:  Oral Oral Oral  Resp:  '20 20 20  '$ Height:      Weight:    201 lb 12.8 oz (91.536 kg)  SpO2: 99% 100% 96% 96%   I&O's:    Intake/Output Summary (Last 24 hours) at 01/01/15 1015 Last data filed at 01/01/15 0604  Gross per 24 hour  Intake    720 ml  Output    900 ml  Net   -180 ml   TELEMETRY: Reviewed telemetry pt in NSR, PVCs:  PHYSICAL EXAM General: Well developed, well nourished, in no acute distress Head:   Normal cephalic and atramatic  Lungs: Wheezing bilaterally to auscultation. Heart:   HRRR S1 crisp S2  click Abdomen: abdomen soft and non-tender Msk:  Back normal,  Normal strength and tone for age. Extremities:   No edema on the right LE.  Neuro: Alert and oriented. Psych:  Normal affect, responds appropriately Skin: No rash  LABS:  Basic Metabolic Panel:  Recent Labs  12/30/14 0230 12/31/14 0305  NA 142 140  K 3.9 3.6  CL 99* 100*  CO2 32 30  GLUCOSE 211* 199*  BUN 36* 30*  CREATININE 1.14 1.09  CALCIUM 9.5 9.4   CBC:  Recent Labs  12/30/14 0941 12/31/14 0305  WBC 11.2* 10.0  HGB 8.4* 8.1*  HCT 28.8* 27.8*   MCV 77.2* 76.0*  PLT 296 282   Coag Panel:   Lab Results  Component Value Date   INR 2.33* 01/01/2015   INR 2.55* 12/31/2014   INR 3.51* 12/30/2014   RADIOLOGY: Dg Chest Port 1 View  12/28/2014   CLINICAL DATA:  Mid chest pain and shortness of breath tonight.  EXAM: PORTABLE CHEST - 1 VIEW  COMPARISON:  05/12/2014  FINDINGS: Postoperative changes in the mediastinum. Interval removal of endotracheal and enteric tubes. Cardiac enlargement with mild pulmonary vascular congestion. Increasing airspace disease in the lung bases may be due to edema or pneumonia. No definite blunting of costophrenic angles. No pneumothorax. Old resection or resorption of the distal right clavicle. Calcification of the aorta.  IMPRESSION: Cardiac enlargement with mild pulmonary vascular congestion. Increasing airspace disease in both lung bases since previous study.   Electronically Signed   By: Lucienne Capers M.D.   On: 12/28/2014 02:06    ASSESSMENT: CAD, elevated troponin, SHOB, AVR  PLAN:  Elevated troponin likely due to heart failure. Continue  with diuresis, the patient appears euvolemic now, he was on lasix 40 mg po daily prior to the admission, he had negative fluid balance overnight, I would switch him back to his home dose of 40 mg po daily. Ok to be discharged home from cardiology standpoint.   Coumadin for prosthetic aortic valve. INR 3.5 on 5/13.   Dorothy Spark, MD  01/01/2015  10:15 AM

## 2015-01-09 ENCOUNTER — Ambulatory Visit: Payer: Medicare Other | Admitting: Cardiology

## 2015-01-10 DIAGNOSIS — J449 Chronic obstructive pulmonary disease, unspecified: Secondary | ICD-10-CM | POA: Diagnosis not present

## 2015-01-10 DIAGNOSIS — G894 Chronic pain syndrome: Secondary | ICD-10-CM | POA: Diagnosis not present

## 2015-01-10 DIAGNOSIS — N400 Enlarged prostate without lower urinary tract symptoms: Secondary | ICD-10-CM | POA: Diagnosis not present

## 2015-01-10 DIAGNOSIS — E0842 Diabetes mellitus due to underlying condition with diabetic polyneuropathy: Secondary | ICD-10-CM | POA: Diagnosis not present

## 2015-01-10 DIAGNOSIS — R109 Unspecified abdominal pain: Secondary | ICD-10-CM | POA: Diagnosis not present

## 2015-01-10 DIAGNOSIS — I10 Essential (primary) hypertension: Secondary | ICD-10-CM | POA: Diagnosis not present

## 2015-01-10 DIAGNOSIS — S88112A Complete traumatic amputation at level between knee and ankle, left lower leg, initial encounter: Secondary | ICD-10-CM | POA: Diagnosis not present

## 2015-01-10 DIAGNOSIS — E1140 Type 2 diabetes mellitus with diabetic neuropathy, unspecified: Secondary | ICD-10-CM | POA: Diagnosis not present

## 2015-01-10 DIAGNOSIS — Z79899 Other long term (current) drug therapy: Secondary | ICD-10-CM | POA: Diagnosis not present

## 2015-01-12 DIAGNOSIS — I25810 Atherosclerosis of coronary artery bypass graft(s) without angina pectoris: Secondary | ICD-10-CM | POA: Diagnosis not present

## 2015-01-13 ENCOUNTER — Other Ambulatory Visit: Payer: Self-pay | Admitting: Cardiovascular Disease

## 2015-01-13 DIAGNOSIS — N390 Urinary tract infection, site not specified: Secondary | ICD-10-CM | POA: Diagnosis not present

## 2015-01-13 DIAGNOSIS — Z7901 Long term (current) use of anticoagulants: Secondary | ICD-10-CM | POA: Diagnosis not present

## 2015-01-13 DIAGNOSIS — Z954 Presence of other heart-valve replacement: Secondary | ICD-10-CM | POA: Diagnosis not present

## 2015-01-14 LAB — PROTIME-INR
INR: 5.6 — ABNORMAL HIGH (ref 0.8–1.2)
Prothrombin Time: 59 s — ABNORMAL HIGH (ref 9.1–12.0)

## 2015-01-18 ENCOUNTER — Encounter: Payer: Medicare Other | Admitting: Physician Assistant

## 2015-01-19 ENCOUNTER — Other Ambulatory Visit: Payer: Self-pay | Admitting: Cardiovascular Disease

## 2015-01-19 NOTE — Telephone Encounter (Signed)
Rx(s) sent to pharmacy electronically.  

## 2015-01-24 ENCOUNTER — Ambulatory Visit (INDEPENDENT_AMBULATORY_CARE_PROVIDER_SITE_OTHER): Payer: Self-pay | Admitting: Pharmacist Clinician (PhC)/ Clinical Pharmacy Specialist

## 2015-01-24 DIAGNOSIS — Z952 Presence of prosthetic heart valve: Secondary | ICD-10-CM

## 2015-01-24 DIAGNOSIS — Z7901 Long term (current) use of anticoagulants: Secondary | ICD-10-CM

## 2015-01-24 DIAGNOSIS — Z954 Presence of other heart-valve replacement: Secondary | ICD-10-CM

## 2015-01-26 ENCOUNTER — Ambulatory Visit (INDEPENDENT_AMBULATORY_CARE_PROVIDER_SITE_OTHER): Payer: Self-pay | Admitting: Pharmacist Clinician (PhC)/ Clinical Pharmacy Specialist

## 2015-01-26 ENCOUNTER — Telehealth: Payer: Self-pay | Admitting: Cardiovascular Disease

## 2015-01-26 ENCOUNTER — Other Ambulatory Visit: Payer: Self-pay | Admitting: Cardiovascular Disease

## 2015-01-26 DIAGNOSIS — Z952 Presence of prosthetic heart valve: Secondary | ICD-10-CM

## 2015-01-26 DIAGNOSIS — Z954 Presence of other heart-valve replacement: Secondary | ICD-10-CM

## 2015-01-26 DIAGNOSIS — Z7901 Long term (current) use of anticoagulants: Secondary | ICD-10-CM | POA: Diagnosis not present

## 2015-01-26 LAB — POCT INR: INR: 6.7

## 2015-01-26 LAB — COAGUCHEK XS/INR WAIVED
INR: 6.7 (ref 0.9–1.1)
Prothrombin Time: 80.1 s

## 2015-01-26 NOTE — Telephone Encounter (Signed)
Patient's INR  6.7 today fingerstick Will notify Erasmo Downer PHARM-D

## 2015-01-26 NOTE — Telephone Encounter (Signed)
Hinton Dyer has some critical labs for the pt  Thanks

## 2015-01-31 ENCOUNTER — Other Ambulatory Visit: Payer: Self-pay | Admitting: Cardiovascular Disease

## 2015-01-31 DIAGNOSIS — Z7901 Long term (current) use of anticoagulants: Secondary | ICD-10-CM | POA: Diagnosis not present

## 2015-01-31 DIAGNOSIS — Z954 Presence of other heart-valve replacement: Secondary | ICD-10-CM | POA: Diagnosis not present

## 2015-01-31 LAB — COAGUCHEK XS/INR WAIVED
INR: 1.6 — ABNORMAL HIGH (ref 0.9–1.1)
Prothrombin Time: 19.1 s

## 2015-01-31 LAB — POCT INR: INR: 1.6

## 2015-02-01 ENCOUNTER — Ambulatory Visit (INDEPENDENT_AMBULATORY_CARE_PROVIDER_SITE_OTHER): Payer: Self-pay | Admitting: Pharmacist Clinician (PhC)/ Clinical Pharmacy Specialist

## 2015-02-01 DIAGNOSIS — Z952 Presence of prosthetic heart valve: Secondary | ICD-10-CM

## 2015-02-01 DIAGNOSIS — Z7901 Long term (current) use of anticoagulants: Secondary | ICD-10-CM

## 2015-02-01 DIAGNOSIS — Z954 Presence of other heart-valve replacement: Secondary | ICD-10-CM

## 2015-02-04 ENCOUNTER — Encounter (HOSPITAL_COMMUNITY): Payer: Self-pay | Admitting: Emergency Medicine

## 2015-02-04 ENCOUNTER — Emergency Department (HOSPITAL_COMMUNITY): Payer: Medicare Other

## 2015-02-04 ENCOUNTER — Inpatient Hospital Stay (HOSPITAL_COMMUNITY)
Admission: EM | Admit: 2015-02-04 | Discharge: 2015-02-12 | DRG: 291 | Disposition: A | Discharge status: Home / Self Care | Payer: Medicare Other | Attending: Internal Medicine | Admitting: Internal Medicine

## 2015-02-04 DIAGNOSIS — R1310 Dysphagia, unspecified: Secondary | ICD-10-CM | POA: Diagnosis present

## 2015-02-04 DIAGNOSIS — R0602 Shortness of breath: Secondary | ICD-10-CM | POA: Diagnosis not present

## 2015-02-04 DIAGNOSIS — G4733 Obstructive sleep apnea (adult) (pediatric): Secondary | ICD-10-CM | POA: Diagnosis not present

## 2015-02-04 DIAGNOSIS — D62 Acute posthemorrhagic anemia: Secondary | ICD-10-CM | POA: Diagnosis not present

## 2015-02-04 DIAGNOSIS — I447 Left bundle-branch block, unspecified: Secondary | ICD-10-CM | POA: Diagnosis not present

## 2015-02-04 DIAGNOSIS — Z954 Presence of other heart-valve replacement: Secondary | ICD-10-CM | POA: Diagnosis not present

## 2015-02-04 DIAGNOSIS — N179 Acute kidney failure, unspecified: Secondary | ICD-10-CM | POA: Diagnosis present

## 2015-02-04 DIAGNOSIS — M549 Dorsalgia, unspecified: Secondary | ICD-10-CM | POA: Diagnosis not present

## 2015-02-04 DIAGNOSIS — E1122 Type 2 diabetes mellitus with diabetic chronic kidney disease: Secondary | ICD-10-CM | POA: Diagnosis not present

## 2015-02-04 DIAGNOSIS — Z951 Presence of aortocoronary bypass graft: Secondary | ICD-10-CM | POA: Diagnosis not present

## 2015-02-04 DIAGNOSIS — R1084 Generalized abdominal pain: Secondary | ICD-10-CM | POA: Diagnosis not present

## 2015-02-04 DIAGNOSIS — Z955 Presence of coronary angioplasty implant and graft: Secondary | ICD-10-CM

## 2015-02-04 DIAGNOSIS — Z79899 Other long term (current) drug therapy: Secondary | ICD-10-CM | POA: Diagnosis not present

## 2015-02-04 DIAGNOSIS — N189 Chronic kidney disease, unspecified: Secondary | ICD-10-CM | POA: Diagnosis not present

## 2015-02-04 DIAGNOSIS — I252 Old myocardial infarction: Secondary | ICD-10-CM

## 2015-02-04 DIAGNOSIS — D649 Anemia, unspecified: Secondary | ICD-10-CM | POA: Diagnosis not present

## 2015-02-04 DIAGNOSIS — R0789 Other chest pain: Secondary | ICD-10-CM | POA: Diagnosis not present

## 2015-02-04 DIAGNOSIS — I248 Other forms of acute ischemic heart disease: Secondary | ICD-10-CM | POA: Diagnosis not present

## 2015-02-04 DIAGNOSIS — I2510 Atherosclerotic heart disease of native coronary artery without angina pectoris: Secondary | ICD-10-CM | POA: Diagnosis not present

## 2015-02-04 DIAGNOSIS — J9600 Acute respiratory failure, unspecified whether with hypoxia or hypercapnia: Secondary | ICD-10-CM | POA: Diagnosis present

## 2015-02-04 DIAGNOSIS — I5023 Acute on chronic systolic (congestive) heart failure: Secondary | ICD-10-CM | POA: Diagnosis not present

## 2015-02-04 DIAGNOSIS — Z79891 Long term (current) use of opiate analgesic: Secondary | ICD-10-CM

## 2015-02-04 DIAGNOSIS — K5289 Other specified noninfective gastroenteritis and colitis: Secondary | ICD-10-CM | POA: Diagnosis not present

## 2015-02-04 DIAGNOSIS — R109 Unspecified abdominal pain: Secondary | ICD-10-CM

## 2015-02-04 DIAGNOSIS — J9601 Acute respiratory failure with hypoxia: Secondary | ICD-10-CM | POA: Diagnosis present

## 2015-02-04 DIAGNOSIS — G8929 Other chronic pain: Secondary | ICD-10-CM | POA: Diagnosis present

## 2015-02-04 DIAGNOSIS — E785 Hyperlipidemia, unspecified: Secondary | ICD-10-CM | POA: Diagnosis present

## 2015-02-04 DIAGNOSIS — I5043 Acute on chronic combined systolic (congestive) and diastolic (congestive) heart failure: Secondary | ICD-10-CM | POA: Diagnosis not present

## 2015-02-04 DIAGNOSIS — Z66 Do not resuscitate: Secondary | ICD-10-CM | POA: Diagnosis present

## 2015-02-04 DIAGNOSIS — J449 Chronic obstructive pulmonary disease, unspecified: Secondary | ICD-10-CM | POA: Diagnosis present

## 2015-02-04 DIAGNOSIS — Z7902 Long term (current) use of antithrombotics/antiplatelets: Secondary | ICD-10-CM | POA: Diagnosis not present

## 2015-02-04 DIAGNOSIS — I214 Non-ST elevation (NSTEMI) myocardial infarction: Secondary | ICD-10-CM | POA: Insufficient documentation

## 2015-02-04 DIAGNOSIS — E780 Pure hypercholesterolemia: Secondary | ICD-10-CM | POA: Diagnosis not present

## 2015-02-04 DIAGNOSIS — I25810 Atherosclerosis of coronary artery bypass graft(s) without angina pectoris: Secondary | ICD-10-CM | POA: Diagnosis not present

## 2015-02-04 DIAGNOSIS — I739 Peripheral vascular disease, unspecified: Secondary | ICD-10-CM | POA: Diagnosis present

## 2015-02-04 DIAGNOSIS — E1142 Type 2 diabetes mellitus with diabetic polyneuropathy: Secondary | ICD-10-CM | POA: Diagnosis present

## 2015-02-04 DIAGNOSIS — I509 Heart failure, unspecified: Secondary | ICD-10-CM | POA: Diagnosis not present

## 2015-02-04 DIAGNOSIS — I25110 Atherosclerotic heart disease of native coronary artery with unstable angina pectoris: Secondary | ICD-10-CM | POA: Diagnosis present

## 2015-02-04 DIAGNOSIS — M109 Gout, unspecified: Secondary | ICD-10-CM | POA: Diagnosis present

## 2015-02-04 DIAGNOSIS — Z7901 Long term (current) use of anticoagulants: Secondary | ICD-10-CM | POA: Diagnosis not present

## 2015-02-04 DIAGNOSIS — E119 Type 2 diabetes mellitus without complications: Secondary | ICD-10-CM

## 2015-02-04 DIAGNOSIS — J438 Other emphysema: Secondary | ICD-10-CM | POA: Diagnosis not present

## 2015-02-04 DIAGNOSIS — I10 Essential (primary) hypertension: Secondary | ICD-10-CM | POA: Diagnosis not present

## 2015-02-04 DIAGNOSIS — Z87891 Personal history of nicotine dependence: Secondary | ICD-10-CM | POA: Diagnosis not present

## 2015-02-04 DIAGNOSIS — K529 Noninfective gastroenteritis and colitis, unspecified: Secondary | ICD-10-CM | POA: Diagnosis present

## 2015-02-04 DIAGNOSIS — R1031 Right lower quadrant pain: Secondary | ICD-10-CM | POA: Diagnosis not present

## 2015-02-04 DIAGNOSIS — I255 Ischemic cardiomyopathy: Secondary | ICD-10-CM

## 2015-02-04 DIAGNOSIS — K219 Gastro-esophageal reflux disease without esophagitis: Secondary | ICD-10-CM | POA: Diagnosis present

## 2015-02-04 DIAGNOSIS — Z952 Presence of prosthetic heart valve: Secondary | ICD-10-CM

## 2015-02-04 DIAGNOSIS — Z89512 Acquired absence of left leg below knee: Secondary | ICD-10-CM

## 2015-02-04 DIAGNOSIS — D509 Iron deficiency anemia, unspecified: Secondary | ICD-10-CM | POA: Diagnosis present

## 2015-02-04 DIAGNOSIS — R1013 Epigastric pain: Secondary | ICD-10-CM | POA: Diagnosis not present

## 2015-02-04 DIAGNOSIS — Z89519 Acquired absence of unspecified leg below knee: Secondary | ICD-10-CM

## 2015-02-04 LAB — TROPONIN I
TROPONIN I: 1.5 ng/mL — AB (ref <0.031)
Troponin I: 0.05 ng/mL — ABNORMAL HIGH (ref <0.031)
Troponin I: 0.68 ng/mL (ref <0.031)

## 2015-02-04 LAB — BASIC METABOLIC PANEL
ANION GAP: 12 (ref 5–15)
BUN: 25 mg/dL — ABNORMAL HIGH (ref 6–20)
CALCIUM: 9.3 mg/dL (ref 8.9–10.3)
CO2: 20 mmol/L — ABNORMAL LOW (ref 22–32)
Chloride: 106 mmol/L (ref 101–111)
Creatinine, Ser: 1.38 mg/dL — ABNORMAL HIGH (ref 0.61–1.24)
GFR, EST AFRICAN AMERICAN: 57 mL/min — AB (ref >60)
GFR, EST NON AFRICAN AMERICAN: 49 mL/min — AB (ref >60)
Glucose, Bld: 310 mg/dL — ABNORMAL HIGH (ref 65–99)
Potassium: 4.5 mmol/L (ref 3.5–5.1)
Sodium: 138 mmol/L (ref 135–145)

## 2015-02-04 LAB — RETICULOCYTES
RBC.: 3.3 MIL/uL — ABNORMAL LOW (ref 4.22–5.81)
Retic Count, Absolute: 85.8 10*3/uL (ref 19.0–186.0)
Retic Ct Pct: 2.6 % (ref 0.4–3.1)

## 2015-02-04 LAB — IRON AND TIBC
Iron: 14 ug/dL — ABNORMAL LOW (ref 45–182)
SATURATION RATIOS: 2 % — AB (ref 17.9–39.5)
TIBC: 732 ug/dL — ABNORMAL HIGH (ref 250–450)
UIBC: 718 ug/dL

## 2015-02-04 LAB — GLUCOSE, CAPILLARY
GLUCOSE-CAPILLARY: 211 mg/dL — AB (ref 65–99)
GLUCOSE-CAPILLARY: 188 mg/dL — AB (ref 65–99)
Glucose-Capillary: 162 mg/dL — ABNORMAL HIGH (ref 65–99)
Glucose-Capillary: 150 mg/dL — ABNORMAL HIGH (ref 65–99)

## 2015-02-04 LAB — DIFFERENTIAL
BASOS PCT: 1 % (ref 0–1)
Basophils Absolute: 0.2 10*3/uL — ABNORMAL HIGH (ref 0.0–0.1)
EOS ABS: 0.9 10*3/uL — AB (ref 0.0–0.7)
EOS PCT: 5 % (ref 0–5)
LYMPHS PCT: 25 % (ref 12–46)
Lymphs Abs: 4.3 10*3/uL — ABNORMAL HIGH (ref 0.7–4.0)
Monocytes Absolute: 1.2 10*3/uL — ABNORMAL HIGH (ref 0.1–1.0)
Monocytes Relative: 7 % (ref 3–12)
NEUTROS PCT: 62 % (ref 43–77)
Neutro Abs: 10.6 10*3/uL — ABNORMAL HIGH (ref 1.7–7.7)

## 2015-02-04 LAB — FOLATE: FOLATE: 12.5 ng/mL (ref >5.9)

## 2015-02-04 LAB — CBC
HCT: 25.7 % — ABNORMAL LOW (ref 39.0–52.0)
Hemoglobin: 7.3 g/dL — ABNORMAL LOW (ref 13.0–17.0)
MCH: 20.4 pg — AB (ref 26.0–34.0)
MCHC: 28.4 g/dL — ABNORMAL LOW (ref 30.0–36.0)
MCV: 72 fL — ABNORMAL LOW (ref 78.0–100.0)
PLATELETS: 460 10*3/uL — AB (ref 150–400)
RBC: 3.57 MIL/uL — AB (ref 4.22–5.81)
RDW: 18 % — AB (ref 11.5–15.5)
WBC: 17.2 10*3/uL — ABNORMAL HIGH (ref 4.0–10.5)

## 2015-02-04 LAB — PROTIME-INR
INR: 2.62 — ABNORMAL HIGH (ref 0.00–1.49)
PROTHROMBIN TIME: 27.6 s — AB (ref 11.6–15.2)

## 2015-02-04 LAB — APTT: APTT: 36 s (ref 24–37)

## 2015-02-04 LAB — MRSA PCR SCREENING: MRSA by PCR: NEGATIVE

## 2015-02-04 LAB — ABO/RH: ABO/RH(D): A POS

## 2015-02-04 LAB — VITAMIN B12: VITAMIN B 12: 264 pg/mL (ref 180–914)

## 2015-02-04 LAB — I-STAT TROPONIN, ED: TROPONIN I, POC: 0.04 ng/mL (ref 0.00–0.08)

## 2015-02-04 LAB — BRAIN NATRIURETIC PEPTIDE: B Natriuretic Peptide: 593.2 pg/mL — ABNORMAL HIGH (ref 0.0–100.0)

## 2015-02-04 LAB — PREPARE RBC (CROSSMATCH)

## 2015-02-04 LAB — FERRITIN: Ferritin: 6 ng/mL — ABNORMAL LOW (ref 24–336)

## 2015-02-04 MED ORDER — ETOMIDATE 2 MG/ML IV SOLN
INTRAVENOUS | Status: AC
  Filled 2015-02-04: qty 20

## 2015-02-04 MED ORDER — NITROGLYCERIN IN D5W 200-5 MCG/ML-% IV SOLN
INTRAVENOUS | Status: AC
  Filled 2015-02-04: qty 250

## 2015-02-04 MED ORDER — FENOFIBRATE 160 MG PO TABS
160.0000 mg | ORAL_TABLET | Freq: Every day | ORAL | Status: DC
  Administered 2015-02-04 – 2015-02-12 (×9): 160 mg via ORAL
  Filled 2015-02-04 (×9): qty 1

## 2015-02-04 MED ORDER — LORAZEPAM 2 MG/ML IJ SOLN
0.5000 mg | Freq: Once | INTRAMUSCULAR | Status: AC
  Administered 2015-02-04: 0.5 mg via INTRAVENOUS
  Filled 2015-02-04: qty 1

## 2015-02-04 MED ORDER — LIDOCAINE HCL (CARDIAC) 20 MG/ML IV SOLN
INTRAVENOUS | Status: DC
Start: 2015-02-04 — End: 2015-02-04
  Filled 2015-02-04: qty 5

## 2015-02-04 MED ORDER — ALLOPURINOL 300 MG PO TABS
300.0000 mg | ORAL_TABLET | Freq: Every day | ORAL | Status: DC
  Administered 2015-02-04 – 2015-02-11 (×8): 300 mg via ORAL
  Filled 2015-02-04 (×8): qty 1

## 2015-02-04 MED ORDER — FUROSEMIDE 10 MG/ML IJ SOLN
40.0000 mg | Freq: Two times a day (BID) | INTRAMUSCULAR | Status: DC
  Administered 2015-02-04 – 2015-02-07 (×7): 40 mg via INTRAVENOUS
  Filled 2015-02-04 (×7): qty 4

## 2015-02-04 MED ORDER — SUCCINYLCHOLINE CHLORIDE 20 MG/ML IJ SOLN
INTRAMUSCULAR | Status: AC
  Filled 2015-02-04: qty 1

## 2015-02-04 MED ORDER — HEPARIN SODIUM (PORCINE) 5000 UNIT/ML IJ SOLN: 60.0000 [IU]/kg | Freq: Once | INTRAMUSCULAR | Status: DC

## 2015-02-04 MED ORDER — OXYCODONE-ACETAMINOPHEN 10-325 MG PO TABS: 1.0000 | ORAL_TABLET | ORAL | Status: DC | PRN

## 2015-02-04 MED ORDER — OXYCODONE-ACETAMINOPHEN 5-325 MG PO TABS
1.0000 | ORAL_TABLET | ORAL | Status: DC | PRN
  Administered 2015-02-06 – 2015-02-11 (×9): 1 via ORAL
  Filled 2015-02-04 (×9): qty 1

## 2015-02-04 MED ORDER — NITROGLYCERIN IN D5W 200-5 MCG/ML-% IV SOLN
0.0000 ug/min | Freq: Once | INTRAVENOUS | Status: AC
  Administered 2015-02-04: 5 ug/min via INTRAVENOUS

## 2015-02-04 MED ORDER — PREGABALIN 50 MG PO CAPS
50.0000 mg | ORAL_CAPSULE | Freq: Two times a day (BID) | ORAL | Status: DC
  Administered 2015-02-04 – 2015-02-12 (×17): 50 mg via ORAL
  Filled 2015-02-04 (×18): qty 1

## 2015-02-04 MED ORDER — SODIUM CHLORIDE 0.9 % IV SOLN
Freq: Once | INTRAVENOUS | Status: AC
  Administered 2015-02-04: 18:00:00 via INTRAVENOUS

## 2015-02-04 MED ORDER — ISOSORBIDE MONONITRATE ER 30 MG PO TB24
30.0000 mg | ORAL_TABLET | Freq: Every day | ORAL | Status: DC
  Administered 2015-02-04 – 2015-02-12 (×9): 30 mg via ORAL
  Filled 2015-02-04 (×10): qty 1

## 2015-02-04 MED ORDER — NITROGLYCERIN 0.4 MG SL SUBL: 0.4000 mg | SUBLINGUAL_TABLET | SUBLINGUAL | Status: DC | PRN

## 2015-02-04 MED ORDER — WARFARIN SODIUM 5 MG PO TABS
5.0000 mg | ORAL_TABLET | ORAL | Status: AC
  Administered 2015-02-04: 5 mg via ORAL
  Filled 2015-02-04: qty 1

## 2015-02-04 MED ORDER — INSULIN ASPART 100 UNIT/ML ~~LOC~~ SOLN
0.0000 [IU] | Freq: Three times a day (TID) | SUBCUTANEOUS | Status: DC
  Administered 2015-02-04 – 2015-02-06 (×7): 2 [IU] via SUBCUTANEOUS
  Administered 2015-02-06: 3 [IU] via SUBCUTANEOUS

## 2015-02-04 MED ORDER — ACETAMINOPHEN 325 MG PO TABS
650.0000 mg | ORAL_TABLET | Freq: Four times a day (QID) | ORAL | Status: DC | PRN
Start: 2015-02-04 — End: 2015-02-12

## 2015-02-04 MED ORDER — FUROSEMIDE 10 MG/ML IJ SOLN
40.0000 mg | Freq: Once | INTRAMUSCULAR | Status: AC
  Administered 2015-02-04: 40 mg via INTRAVENOUS
  Filled 2015-02-04: qty 4

## 2015-02-04 MED ORDER — ROCURONIUM BROMIDE 50 MG/5ML IV SOLN
INTRAVENOUS | Status: AC
  Filled 2015-02-04: qty 2

## 2015-02-04 MED ORDER — ALBUTEROL SULFATE (2.5 MG/3ML) 0.083% IN NEBU: 2.5000 mg | INHALATION_SOLUTION | RESPIRATORY_TRACT | Status: DC | PRN

## 2015-02-04 MED ORDER — POTASSIUM CHLORIDE CRYS ER 20 MEQ PO TBCR
20.0000 meq | EXTENDED_RELEASE_TABLET | Freq: Two times a day (BID) | ORAL | Status: DC
  Administered 2015-02-04 – 2015-02-12 (×17): 20 meq via ORAL
  Filled 2015-02-04 (×16): qty 1

## 2015-02-04 MED ORDER — WARFARIN - PHARMACIST DOSING INPATIENT: Freq: Every day | Status: DC

## 2015-02-04 MED ORDER — ONDANSETRON HCL 4 MG PO TABS: 4.0000 mg | ORAL_TABLET | Freq: Four times a day (QID) | ORAL | Status: DC | PRN

## 2015-02-04 MED ORDER — ROSUVASTATIN CALCIUM 10 MG PO TABS
20.0000 mg | ORAL_TABLET | Freq: Every day | ORAL | Status: DC
  Administered 2015-02-04 – 2015-02-11 (×8): 20 mg via ORAL
  Filled 2015-02-04 (×10): qty 2

## 2015-02-04 MED ORDER — HEPARIN SODIUM (PORCINE) 5000 UNIT/ML IJ SOLN
4000.0000 [IU] | Freq: Once | INTRAMUSCULAR | Status: AC
  Administered 2015-02-04: 4000 [IU] via INTRAVENOUS

## 2015-02-04 MED ORDER — ONDANSETRON HCL 4 MG/2ML IJ SOLN: 4.0000 mg | Freq: Four times a day (QID) | INTRAMUSCULAR | Status: DC | PRN

## 2015-02-04 MED ORDER — ASPIRIN 81 MG PO CHEW: 324.0000 mg | CHEWABLE_TABLET | Freq: Once | ORAL | Status: DC

## 2015-02-04 MED ORDER — PANTOPRAZOLE SODIUM 40 MG PO TBEC
40.0000 mg | DELAYED_RELEASE_TABLET | Freq: Every day | ORAL | Status: DC
  Administered 2015-02-04 – 2015-02-05 (×2): 40 mg via ORAL
  Filled 2015-02-04 (×2): qty 1

## 2015-02-04 MED ORDER — CLOPIDOGREL BISULFATE 75 MG PO TABS
75.0000 mg | ORAL_TABLET | Freq: Every day | ORAL | Status: DC
  Administered 2015-02-05: 75 mg via ORAL
  Filled 2015-02-04: qty 1

## 2015-02-04 MED ORDER — OXYCODONE HCL 5 MG PO TABS
5.0000 mg | ORAL_TABLET | ORAL | Status: DC | PRN
  Administered 2015-02-06 – 2015-02-11 (×9): 5 mg via ORAL
  Filled 2015-02-04 (×9): qty 1

## 2015-02-04 MED ORDER — ACETAMINOPHEN 650 MG RE SUPP: 650.0000 mg | Freq: Four times a day (QID) | RECTAL | Status: DC | PRN

## 2015-02-04 MED ORDER — METOPROLOL SUCCINATE ER 50 MG PO TB24
50.0000 mg | ORAL_TABLET | Freq: Every day | ORAL | Status: DC
  Administered 2015-02-04 – 2015-02-12 (×9): 50 mg via ORAL
  Filled 2015-02-04 (×9): qty 1

## 2015-02-04 MED ORDER — WARFARIN SODIUM 2.5 MG PO TABS: 2.5000 mg | ORAL_TABLET | ORAL | Status: DC

## 2015-02-04 NOTE — ED Notes (Signed)
Pt arrives [facility], CP  midsternal L, L jaw and L arm, SHOB, diaphoretic. 1 SL nitro, no ASA. 16 G L AC

## 2015-02-04 NOTE — ED Provider Notes (Signed)
CSN: [id number]     Arrival date & time 02/04/15  0402 History   None    This chart was scribed for Linton Flemings, MD by Forrestine Him, ED Scribe. This patient was seen in room A02C/A02C and the patient's care was started 4:07 AM.   Chief Complaint  Patient presents with  . Code STEMI   The history is provided by the EMS personnel. No language interpreter was used.     LEVEL 5 CAVEAT DUE TO CONDITION  HPI Comments: Tanner Pena brought in by EMS is a 74 y.o. male with a PMHx of DM, HTN, hypercholesteremia, COPD, peripheral vascular disease, CAD, MI, CHF who presents to the Emergency Department here for code STEMI this morning. Per EMS, L sided chest pain that radiates to the L arm reported onset 1 hour prior to arrival. Current pain rated 10/10. Pt presents diaphoretic and short of breath. 1 dose of Nitro given en route to department. No ASA given. Pt is on Coumadin at home.  Past Medical History  Diagnosis Date  . Diabetes mellitus 2007  . Hypertension   . Gout   . Hypercholesteremia   . Peripheral vascular disease     stents lower extremity  . COPD (chronic obstructive pulmonary disease)   . S/P aortic valve replacement 1990    a. [employer]  . Chronic back pain   . Dysphagia   . Neuromuscular disorder   . Peripheral neuropathy   . Critical lower limb ischemia   . CAD (coronary artery disease)     a. 05/13/14 [name] s/p overlapping DESx2 to SVG to RCA. b.  s/p CABG '90 with redo '94 & stent to RCA SVG in 2005  . Chronic toe ulcer     a. left foot  . Shortness of breath   . Myocardial infarction   . CHF (congestive heart failure)   . Stone in kidney   . GERD (gastroesophageal reflux disease)   . Arthritis   . Sleep apnea     tested greater than 7 years ago per patient   Past Surgical History  Procedure Laterality Date  . Open heart surgery  1990    prosthetic heart valve, one bypass  . Rotator cuff repair      right  . Cataract extraction      bilateral  . Coronary  stent placement  2005    RCA vein graft A 3.0x13.0 TAXUS stent was then placed int he vessel a Viva 3.0x4.0 (perfusion balloon was made ready it was placed through the entire lenght of the stent  . Peripheral vascular procedures lower extremities      right external iliac  artery PTA and stenting as well as bilateral SFA intervention remotely. Repeat procedures in 2011 bilaterally  . Coronary artery bypass graft  1994    6 vessels  . [name]  06/13/2011    Procedure: Venia Minks DILATION;  Surgeon: [name], MD;  Location: [facility];  Service: Endoscopy;  Laterality: N/A;  Dilated to 56.   . Angioplasty illiac artery    . Back surgery  [id number]    2  . Eye surgery    . Amputation Left 07/13/2014    Procedure: Transmetatarsal Amputation;  Surgeon: [name], MD;  Location: [facility];  Service: Orthopedics;  Laterality: Left;  . Lower extremity angiogram N/A 02/15/2013    Procedure: LOWER EXTREMITY ANGIOGRAM;  Surgeon: [name], MD;  Location: [facility] CATH LAB;  Service: Cardiovascular;  Laterality: N/A;  . Left heart catheterization with coronary angiogram N/A 05/11/2014    Procedure: LEFT HEART CATHETERIZATION WITH CORONARY ANGIOGRAM;  Surgeon: [name], MD;  Location: [facility] CATH LAB;  Service: Cardiovascular;  Laterality: N/A;  . Percutaneous coronary stent intervention (pci-s) N/A 05/13/2014    Procedure: PERCUTANEOUS CORONARY STENT INTERVENTION (PCI-S);  Surgeon: [name], MD;  [facility] C[facility] LAB;  Service: Cardiovascular;  Laterality: N/A;  . Lower extremity angiogram N/A 06/06/2014    Procedure: LOWER EXTREMITY ANGIOGRAM;  Surgeon: [name], MD;  Location: [facility] C[facility] LAB;  Service: Cardiovascular;  Laterality: N/A;  . Amputation Left 08/20/2014    Procedure: Revision Transmetatarsal Amputation versus Below Knee Amputation;  Surgeon: [name], MD;  Location: [facility];  Service: Orthopedics;  Laterality: Left;  . Stump revision Left 09/23/2014     Procedure: Revision Left Below Knee Amputation;  Surgeon: [name], MD;  Location: [place] [facility];  Service: Orthopedics;  Laterality: Left;  . Stump revision Left 10/13/2014    Procedure: REVISION LEFT BELOW KNEE AMPUTATION STUMP;  Surgeon: [name], MD;  Location: [facility];  Service: Orthopedics;  Laterality: Left;   Family History  Problem Relation Age of Onset  . Colon cancer Neg Hx   . Liver disease Neg Hx    History  Substance Use Topics  . Smoking status: Former Smoker -- 1.00 packs/day for 55 years    Types: Cigarettes    Start date: 08/19/1953    Quit date: 05/07/2014  . Smokeless tobacco: Current User    Types: Chew    Last Attempt to Quit: 08/20/1995     Comment: Has quit on 3 occasions. Counseling given today 5-10 minutes   I am more than likely going to quit "  . Alcohol Use: No     Comment: socially, sometimes 12 ounce beer daily, may go month without/no whiskey    Review of Systems  Unable to perform ROS: Other      Allergies  Review of patient's allergies indicates no known allergies.  Home Medications   Prior to Admission medications   Medication Sig Start Date End Date Taking? Authorizing Provider  acetaminophen (TYLENOL) 500 MG tablet Take 500 mg by mouth every 6 (six) hours as needed for moderate pain.    Historical Provider, MD  albuterol (VOSPIRE ER) 4 MG 12 hr tablet Take 4 mg by mouth 2 (two) times daily.     Historical Provider, MD  albuterol-ipratropium (COMBIVENT) 18-103 MCG/ACT inhaler Inhale 1 puff into the lungs 4 (four) times daily. Coughing/ Shortness of Breath    Historical Provider, MD  allopurinol (ZYLOPRIM) 300 MG tablet Take 300 mg by mouth at bedtime.     Historical Provider, MD  Ascorbic Acid (VITAMIN C WITH ROSE HIPS) 1000 MG tablet Take 1,000 mg by mouth daily.    Historical Provider, MD  cephALEXin (KEFLEX) 500 MG capsule Take 1 capsule (500 mg total) by mouth 4 (four) times daily. 01/01/15   Belkys A Regalado, MD   cholecalciferol (VITAMIN D) 1000 UNITS tablet Take 2,000 Units by mouth daily.    Historical Provider, MD  clopidogrel (PLAVIX) 75 MG tablet Take 1 tablet (75 mg total) by mouth daily with breakfast. 05/17/14   Eileen Stanford, PA-C  CRESTOR 20 MG tablet TAKE (1) TABLET BY MOUTH AT BEDTIME FOR CHOLESTEROL. 01/19/15   [name], MD  fenofibrate (TRICOR) 145 MG tablet TAKE 1 TABLET BY MOUTH ONCE DAILY FOR CHOLESTEROL. 01/19/15  [name], MD  fish oil-omega-3 fatty acids 1000 MG capsule Take 1 capsule (1 g total) by mouth 2 (two) times daily. 01/22/13   Casimiro Needle Alvstad, RPH-CPP  furosemide (LASIX) 80 MG tablet Take 40 mg by mouth daily.    Historical Provider, MD  glimepiride (AMARYL) 1 MG tablet Take 1 mg by mouth 2 (two) times daily.    Historical Provider, MD  isosorbide mononitrate (IMDUR) 30 MG 24 hr tablet TAKE ONE TABLET BY MOUTH ONCE DAILY. 10/25/14   Troy Sine, MD  metoprolol succinate (TOPROL-XL) 50 MG 24 hr tablet Take 1 tablet (50 mg total) by mouth daily. Take with or immediately following a meal. 05/17/14   Eileen Stanford, PA-C  nitroGLYCERIN (NITROSTAT) 0.4 MG SL tablet Place 1 tablet (0.4 mg total) under the tongue every 5 (five) minutes as needed for chest pain. 05/17/14   Eileen Stanford, PA-C  oxyCODONE-acetaminophen (PERCOCET) 10-325 MG per tablet Take 1 tablet by mouth every 4 (four) hours as needed for pain. 09/23/14   [name], MD  pantoprazole (PROTONIX) 40 MG tablet Take 1 tablet (40 mg total) by mouth daily. 01/01/15   Belkys A Regalado, MD  potassium chloride SA (K-DUR,KLOR-CON) 20 MEQ tablet Take 20 mEq by mouth daily.      Historical Provider, MD  pregabalin (LYRICA) 50 MG capsule Take one capsule by mouth twice daily for pains 08/23/14   Blanchie Serve, MD  ramipril (ALTACE) 2.5 MG capsule TAKE 1 CAPSULE BY MOUTH AT BEDTIME. 09/27/14   [name], MD  sitaGLIPtin (JANUVIA) 50 MG tablet Take 50 mg by mouth daily.    Historical Provider, MD   warfarin (COUMADIN) 5 MG tablet Take 1-1.5 tablets (5-7.5 mg total) by mouth at bedtime. Takes one-half tablet (7.'5mg'$  total) on Fridays. Takes one tablet ('5mg'$  total) on all other days 01/01/15   Belkys A Regalado, MD  zinc gluconate 50 MG tablet Take 50 mg by mouth daily.    Historical Provider, MD   Triage Vitals: BP 151/80 mmHg  Pulse 110  Resp 28  SpO2 83%   Physical Exam  Constitutional: He is oriented to person, place, and time. He appears well-developed and well-nourished.  HENT:  Head: Normocephalic and atraumatic.  Eyes: EOM are normal.  Neck: Normal range of motion.  Cardiovascular: Normal rate, regular rhythm, normal heart sounds and intact distal pulses.   Pulmonary/Chest: Effort normal and breath sounds normal. No respiratory distress.  Abdominal: Soft. He exhibits no distension. There is no tenderness.  Musculoskeletal: Normal range of motion.  Neurological: He is alert and oriented to person, place, and time.  Skin: Skin is warm and dry.  Psychiatric: He has a normal mood and affect. Judgment normal.  Nursing note and vitals reviewed.   ED Course  Procedures (including critical care time)  DIAGNOSTIC STUDIES: Oxygen Saturation is 83% on RA, low by my interpretation.    COORDINATION OF CARE: 4:06 AM- Will give Nitro and ASA. Will order CBC, PT-INR, APTT, BMP, heparin injection, i-stat troponin, BNP, CXR, BIPAP, Discussed treatment plan with pt at bedside and pt agreed to plan.     Labs Review Labs Reviewed  CBC - Abnormal; Notable for the following:    WBC 17.2 (*)    RBC 3.57 (*)    Hemoglobin 7.3 (*)    HCT 25.7 (*)    MCV 72.0 (*)    MCH 20.4 (*)    MCHC 28.4 (*)    RDW 18.0 (*)  Platelets 460 (*)    All other components within normal limits  DIFFERENTIAL - Abnormal; Notable for the following:    Neutro Abs 10.6 (*)    Lymphs Abs 4.3 (*)    Monocytes Absolute 1.2 (*)    Eosinophils Absolute 0.9 (*)    Basophils Absolute 0.2 (*)    All other  components within normal limits  PROTIME-INR - Abnormal; Notable for the following:    Prothrombin Time 27.6 (*)    INR 2.62 (*)    All other components within normal limits  BASIC METABOLIC PANEL - Abnormal; Notable for the following:    CO2 20 (*)    Glucose, Bld 310 (*)    BUN 25 (*)    Creatinine, Ser 1.38 (*)    GFR calc non Af Amer 49 (*)    GFR calc Af Amer 57 (*)    All other components within normal limits  BRAIN NATRIURETIC PEPTIDE - Abnormal; Notable for the following:    B Natriuretic Peptide 593.2 (*)    All other components within normal limits  APTT  TROPONIN I  I-STAT TROPOININ, ED    Imaging Review Dg Chest Portable 1 View  02/04/2015   CLINICAL DATA:  Shortness of breath.  EXAM: PORTABLE CHEST - 1 VIEW  COMPARISON:  12/28/2014  FINDINGS: Postoperative changes in the mediastinum. Cardiac enlargement with pulmonary vascular congestion and perihilar infiltrates suggesting edema. Linear atelectasis in the left mid lung. No definite pleural effusion although the entire costophrenic angles are not included within the field of view. No pneumothorax. Old resection or resorption of the distal right clavicle.  IMPRESSION: Cardiac enlargement with pulmonary vascular congestion and perihilar edema.   Electronically Signed   By: Lucienne Capers M.D.   On: 02/04/2015 04:33     EKG Interpretation   Date/Time:  Saturday February 04 2015 04:09:38 EDT Ventricular Rate:  109 PR Interval:  151 QRS Duration: 167 QT Interval:  424 QTC Calculation: 571 R Axis:   21 Text Interpretation:  Sinus or ectopic atrial tachycardia LVH with  secondary repolarization abnormality Prolonged QT interval Baseline wander  in lead(s) V2 ST elevation AVR similar to prior Confirmed by Lorilynn Lehr  MD,  Nida Manfredi ([id number]) on 02/04/2015 4:13:04 AM      EKG Interpretation  Date/Time:  Saturday February 04 2015 04:30:31 EDT Ventricular Rate:  90 PR Interval:  138 QRS Duration: 152 QT Interval:  418 QTC  Calculation: 511 R Axis:   23 Text Interpretation:  Sinus or ectopic atrial tachycardia Multiple premature complexes, vent  Left bundle branch block Baseline wander in lead(s) V2 similar from ekg on 5/11 Confirmed by Lutie Pickler  MD, Delorice Bannister ([id number]) on 02/04/2015 4:48:12 AM      CRITICAL CARE Performed by: Kalman Drape Total critical care time: 60 min Critical care time was exclusive of separately billable procedures and treating other patients. Critical care was necessary to treat or prevent imminent or life-threatening deterioration. Critical care was time spent personally by me on the following activities: development of treatment plan with patient and/or surrogate as well as nursing, discussions with consultants, evaluation of patient's response to treatment, examination of patient, obtaining history from patient or surrogate, ordering and performing treatments and interventions, ordering and review of laboratory studies, ordering and review of radiographic studies, pulse oximetry and re-evaluation of patient's condition. Care   MDM   Final diagnoses:  CHF exacerbation  Acute respiratory failure with hypoxia    74 year old male with acute onset of chest  pain or shortness of breath.  Initially thought to have a STEMI, but EKG not significantly different from prior.  Patient diaphoretic, hypoxic, with respiratory distress.  Appears to be acute CHF exacerbation.  After BiPAP, patient much improved.  Labs ordered.  Patient to be started on nitro drip.  I personally performed the services described in this documentation, which was scribed in my presence. The recorded information has been reviewed and is accurate.   6:28 AM Patient improved, no longer diaphoretic.  Respiratory distress nearly resolved on BiPAP.  Patient still complaining of shortness of breath.  To receive a dose of Lasix.  Hospitalist requests second troponin, if negative, stable for admission to the stepdown unit   Linton Flemings,  MD 02/04/15 [phone]

## 2015-02-04 NOTE — Progress Notes (Signed)
CRITICAL VALUE ALERT  Critical value received:  Troponin 0.68  Date of notification:  02/04/2015  Time of notification:  12:26 PM  Critical value read back:Yes.    Nurse who received alert:  Magdalene River  MD notified (1st page):  Dr. Domenic Polite  Time of first page:  12:26 PM  MD notified (2nd page):  Time of second page:  Responding MD:  Dr. Domenic Polite  Time MD responded:  12:26 PM

## 2015-02-04 NOTE — ED Notes (Signed)
Pt called code stemi by EMS, was never paged out by [facility]. Canceled by Dr. Sharol Given upon arrival

## 2015-02-04 NOTE — ED Notes (Signed)
Admitting at bedside 

## 2015-02-04 NOTE — ED Notes (Addendum)
Per admitting, stop nitro drip. Pt denies chest pain at this time

## 2015-02-04 NOTE — Consult Note (Signed)
CARDIOLOGY CONSULT NOTE   Patient ID: Tanner Pena MRN: [id number], DOB/AGE: 04/27/1941   Admit date: 02/04/2015 Date of Consult: 02/04/2015   Primary Physician: [name], MD Primary Cardiologist: Dr. [name] Dr. [name]  Pt. Profile  74 year old Caucasian male with past medical history of CAD s/p 6v CABG in 1990 with St. 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 Diabetes mellitus 2007  . Hypertension   . Gout   . Hypercholesteremia   . Peripheral vascular disease     stents lower extremity  . COPD (chronic obstructive pulmonary disease)   . S/P aortic valve replacement 1990    a. St. Jude  . Chronic back pain   . Dysphagia   . Neuromuscular disorder   . Peripheral neuropathy   . Critical lower limb ischemia   . CAD (coronary artery disease)     a. 05/13/14 [name] s/p overlapping DESx2 to SVG to RCA. b.  s/p CABG '90 with redo '94 & stent to RCA SVG in 2005  . Chronic toe ulcer     a. left foot  . Shortness of breath   . Myocardial infarction   . CHF (congestive heart failure)   . Stone in kidney   . GERD (gastroesophageal reflux disease)   . Arthritis   . Sleep apnea     tested greater than 7 years ago per patient    Past Surgical History  Procedure Laterality Date  . Open heart surgery  1990    prosthetic heart valve, one bypass  . Rotator cuff repair      right  . Cataract extraction      bilateral  . Coronary stent placement  2005    RCA vein graft A 3.0x13.0 TAXUS stent was then placed int he vessel a Viva 3.0x4.0 (perfusion balloon was made ready it was placed through the entire lenght of the stent  . Peripheral vascular procedures lower extremities      right external iliac  artery PTA and stenting as well as bilateral SFA intervention  remotely. Repeat procedures in 2011 bilaterally  . Coronary artery bypass graft  1994    6 vessels  . [name]  06/13/2011    Procedure: Venia Minks DILATION;  Surgeon: [name], MD;  Location: [facility];  Service: Endoscopy;  Laterality: N/A;  Dilated to 56.   . Angioplasty illiac artery    . Back surgery  [id number]    2  . Eye surgery    . Amputation Left 07/13/2014    Procedure: Transmetatarsal Amputation;  Surgeon: [name], MD;  Location: [facility];  Service: Orthopedics;  Laterality: Left;  . Lower extremity angiogram N/A 02/15/2013    Procedure: LOWER EXTREMITY ANGIOGRAM;  Surgeon: [name], MD;  Location: [facility] CATH LAB;  Service: Cardiovascular;  Laterality: N/A;  . Left heart catheterization with coronary angiogram N/A 05/11/2014    Procedure: LEFT HEART CATHETERIZATION WITH CORONARY ANGIOGRAM;  Surgeon: [name], MD;  Location: [facility] CATH LAB;  Service: Cardiovascular;  Laterality: N/A;  . Percutaneous coronary stent intervention (pci-s) N/A 05/13/2014    Procedure: PERCUTANEOUS CORONARY STENT INTERVENTION (PCI-S);  Surgeon: [name], MD;  Location: [facility] CATH LAB;  Service: Cardiovascular;  Laterality: N/A;  . Lower  extremity angiogram N/A 06/06/2014    Procedure: LOWER EXTREMITY ANGIOGRAM;  Surgeon: [name], MD;  Location: [facility] CATH LAB;  Service: Cardiovascular;  Laterality: N/A;  . Amputation Left 08/20/2014    Procedure: Revision Transmetatarsal Amputation versus Below Knee Amputation;  Surgeon: [name], MD;  Location: [place];  Service: Orthopedics;  Laterality: Left;  . Stump revision Left 09/23/2014    Procedure: Revision Left Below Knee Amputation;  Surgeon: [name], MD;  Location: [place];  Service: Orthopedics;  Laterality: Left;  . Stump revision Left 10/13/2014    Procedure: REVISION LEFT BELOW KNEE AMPUTATION STUMP;  Surgeon: [name], MD;  Location: [facility];  Service: Orthopedics;  Laterality: Left;      Allergies  No Known Allergies  HPI   The patient is a 74 year old Caucasian male with past medical history of CAD s/p 6v CABG in 1990 with St. 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 He had a stent to SVG to RCA in 2005. He has been on long-term Coumadin for mechanical aortic valve. The last time he underwent cardiac catheterization was in September 2015 when he presented with unstable angina. He underwent diagnostic catheterization on 05/11/2014 which showed triple-vessel disease with 6 out of 6 patent grafts (LIMA to LAD, SVG to OM1/OM2/OM3/OM4, SVG to RCA) except 90% stenosis in mid SVG to RCA. He had respiratory failure during diagnostic cath require intubation and mechanical ventilation and severe hypotension afterward due to paralytic agent and sedatives. He returned to the cath lab on 05/13/2014 and underwent PCI of SVG with distal embolic protection (5.39 x 15, 2.75 x 23 and 2.7 x 28 were placed in overlapping fashion from the proximal to mid graft. The entire stented segment was post dilated up to 3.6 mm in diameter). He was initially placed on aspirin and Plavix along with resuming Coumadin. Aspirin was eventually discontinued and he was continued on Plavix and Coumadin alone.   He was most recently hospitalized on 12/28/2014 with acute respiratory failure and was seen by cardiology service at that time. Due to his wheelchair-bound status, he has not been active at home. He has chronic orthopnea and paroxysmal nocturnal dyspnea and chronic lower extremity edema. Echocardiogram obtained during the admission showed EF 40%, mechanical AVR with no periprosthetic regurgitation, inferior, septal and apical hypokinesis, lipomatose septal hypertrophy. He had mild troponin elevation of 0.24 which was felt to be demand ischemia. No ischemic workup was felt necessary at the time. The elevated troponin was felt to be  due to heart failure. On discharge he was 201 pounds. He was eventually discharged on 40 mg daily of Lasix which was his previous home dose.   He returned to [facility] on 02/04/2015 with acute onset of dyspnea this morning along with intermittent chest pain for the last several days. According to the patient, he has been waking up around 2 AM every morning for the past 3 days with burning sensation over the right chest. He is unable to tell me what was his prior anginal symptom as he is a very poor historian. Chest x-ray shows cardiac enlargement with pulmonary vascular congestion and perihilar edema. Significant laboratory finding include white blood cell count of 17.2, hemoglobin 7.3. Platelet 460. INR 2.62. Creatinine 1.38. BNP 593. Troponin was initially 0.05, and gradually trended up to 0.68. Cardiology has been consulted for chest pain.  Inpatient Medications  . sodium chloride   Intravenous Once  . allopurinol  300 mg Oral QHS  . [START ON 02/05/2015] clopidogrel  75 mg Oral Q breakfast  . fenofibrate  160 mg Oral Daily  . furosemide  40 mg Intravenous Q12H  . insulin aspart  0-9 Units Subcutaneous TID WC  . isosorbide mononitrate  30 mg Oral Daily  . metoprolol succinate  50 mg Oral Daily  . pantoprazole  40 mg Oral Daily  . potassium chloride SA  20 mEq Oral BID  . pregabalin  50 mg Oral BID  . rosuvastatin  20 mg Oral q1800  . [START ON 02/06/2015] warfarin  2.5 mg Oral Once per day on Mon Fri  . warfarin  5 mg Oral Once per day on Sun Tue Wed Thu Sat  . Warfarin - Pharmacist Dosing Inpatient   Does not apply q1800    Family History Family History  Problem Relation Age of Onset  . Colon cancer Neg Hx   . Liver disease Neg Hx      Social History History   Social History  . Marital Status: Legally Separated    Spouse Name: N/A  . Number of Children: 5  . Years of Education: N/A   Occupational History  . retired     Stage manager   Social History Main  Topics  . Smoking status: Former Smoker -- 1.00 packs/day for 55 years    Types: Cigarettes    Start date: 08/19/1953    Quit date: 05/07/2014  . Smokeless tobacco: Current User    Types: Chew    Last Attempt to Quit: 08/20/1995     Comment: Has quit on 3 occasions. Counseling given today 5-10 minutes   I am more than likely going to quit "  . Alcohol Use: No     Comment: socially, sometimes 12 ounce beer daily, may go month without/no whiskey  . Drug Use: No  . Sexual Activity: Not on file   Other Topics Concern  . Not on file   Social History Narrative   Has 3 daughters   Has 2 sons     Review of Systems  General:  No chills, fever, night sweats or weight changes.  Cardiovascular:  +chest pain, dyspnea on exertion, edema, orthopnea, paroxysmal nocturnal dyspnea. Dermatological: No rash, lesions/masses Respiratory: +cough, dyspnea Urologic: No hematuria, dysuria Abdominal:   No nausea, vomiting, diarrhea, bright red blood per rectum, melena, or hematemesis Neurologic:  No visual changes, wkns, changes in mental status. All other systems reviewed and are otherwise negative except as noted above.  Physical Exam  Blood pressure 147/43, pulse 64, temperature 96.4 F (35.8 C), temperature source Temporal, resp. rate 17, height '5\' 8"'$  (1.727 m), weight 200 lb (90.719 kg), SpO2 100 %.  General: Pleasant, NAD Psych: Normal affect. Neuro: Alert and oriented X 3. Moves all extremities spontaneously. HEENT: Normal  Neck: Supple without bruits. Marked JVD up to earlobe Lungs:  Resp regular and unlabored. Occasional rhonchi and rale in lower base, no wheezing.  Heart: Irregular.  no s3, s4. Faint systolic murmur Abdomen: Soft, non-tender, non-distended, BS + x 4.  Extremities: No clubbing, cyanosis. DP/PT/Radials 2+ and equal bilaterally. 2+ pitting edema in bilateral LE  Labs   Recent Labs  02/04/15 0413 02/04/15 1033  TROPONINI 0.05* 0.68*   Lab Results  Component Value  Date   WBC 17.2* 02/04/2015   HGB 7.3* 02/04/2015   HCT 25.7* 02/04/2015   MCV 72.0* 02/04/2015  PLT 460* 02/04/2015    Recent Labs Lab 02/04/15 0415  NA 138  K 4.5  CL 106  CO2 20*  BUN 25*  CREATININE 1.38*  CALCIUM 9.3  GLUCOSE 310*   Lab Results  Component Value Date   CHOL 123 05/09/2014   HDL 29* 05/09/2014   LDLCALC 62 05/09/2014   TRIG 161* 05/09/2014   Lab Results  Component Value Date   DDIMER <0.27 01/19/2014    Radiology/Studies  Dg Chest Portable 1 View  02/04/2015   CLINICAL DATA:  Shortness of breath.  EXAM: PORTABLE CHEST - 1 VIEW  COMPARISON:  12/28/2014  FINDINGS: Postoperative changes in the mediastinum. Cardiac enlargement with pulmonary vascular congestion and perihilar infiltrates suggesting edema. Linear atelectasis in the left mid lung. No definite pleural effusion although the entire costophrenic angles are not included within the field of view. No pneumothorax. Old resection or resorption of the distal right clavicle.  IMPRESSION: Cardiac enlargement with pulmonary vascular congestion and perihilar edema.   Electronically Signed   By: Lucienne Capers M.D.   On: 02/04/2015 04:33    ECG  LBBB  ASSESSMENT AND PLAN  1. Chest pain in the setting of anemia  - being transfused, trop jumped from 0.05 to 0.6, however in the setting of anemia  - will trend trop for now and monitor for recurrence of CP  2. Acute on chronic combined systolic and diastolic HF  - continue IV diuresis, monitor renal function.  3. Hypochromic microcystic anemia  - being transfused, need to keep hgb at least 8 or even high given known underlying coronary dx  4. Leukocytosis: unclear significance  5. Acute renal insufficiency: likely related to HF  6. CAD s/p 6v CABG in 1990 with St. Jude AVR, redo CABG in 1994 to the left coronary system  7. 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 HTN 9. HLD 10. DM 11. OSA 12. Chronic LBBB  Signed, Almyra Deforest, PA-C 02/04/2015, 4:07 PM  Personally seen and examined. Agree with above.  74 year old male with severe coronary artery disease status post redo bypass surgery, last intervention to SVG to RCA using multiple stents in 2015 with subsequent hospitalization with respiratory failure, severe peripheral vascular disease, mechanical St. Jude aortic valve, chronic Coumadin, acute on chronic microcytic anemia with elevated troponin, leukocytosis.  -Agree with plan as laid out above. Challenging to obtain a clear history from him. -Will be important to try to maintain hemoglobin of greater than 9 at least greater than 8 given his underlying severe coronary artery disease. His severe anemia is likely triggered a demand ischemia event, low level troponin elevation as is currently seen. -Renal impairment noted. Would continue with medical management. I would not pursue invasive management unless chest pain or anginal symptoms worsen despite aggressive medical therapy. -We'll follow along with you.  Candee Furbish, MD

## 2015-02-04 NOTE — H&P (Signed)
[facility] History and Physical  Tanner Pena YIR:[id number] DOB: 1940/08/29 DOA: 02/04/2015  Referring physician: EDP PCP: [name], MD  Cards: Dr.Kelly  Chief Complaint: chest pain and dyspnea  HPI: Tanner Pena is a 74 y.o. male with Past medical history of  CAD, s/p CABG, chronic systolic and diastolic heart failure, s/p AVR withSt. Jude mechanical aortic valve,  COPD, peripheral neuropathy, peripheral vascular disease status post BKA of the left leg, diabetes mellitus, essential hypertension, dyslipidemia. Presented to the ER with acute onset dyspnea this am, in addition he had been having intermittent chest pain the last 2 nights, which he describes as an ache and sometimes with pressure, radiating to neck and arm. He also reports mild cough, non productive, no fevers or chills. In ER, noted to be in acute resp distress, placed on BIPAP, CXR with pulm edema, WBC 17 and Hb of 7.3 He denies any melena, hematemesis or hematochezia  Review of Systems: positives bolded Constitutional:  No weight loss, night sweats, Fevers, chills, fatigue.  HEENT:  No headaches, Difficulty swallowing,Tooth/dental problems,Sore throat,  No sneezing, itching, ear ache, nasal congestion, post nasal drip,  Cardio-vascular:   chest pain, Orthopnea, PND, swelling in lower extremities, anasarca, dizziness, palpitations  GI:  No heartburn, indigestion, abdominal pain, nausea, vomiting, diarrhea, change in bowel habits, loss of appetite  Resp:   shortness of breath with exertion or at rest. No excess mucus, no productive cough, No non-productive cough, No coughing up of blood.No change in color of mucus.No wheezing.No chest wall deformity  Skin:  no rash or lesions.  GU:  no dysuria, change in color of urine, no urgency or frequency. No flank pain.  Musculoskeletal:  No joint pain or swelling. No decreased range of motion. No back pain.  Psych:  No change in mood or affect. No  depression or anxiety. No memory loss.   Past Medical History  Diagnosis Date  . Diabetes mellitus 2007  . Hypertension   . Gout   . Hypercholesteremia   . Peripheral vascular disease     stents lower extremity  . COPD (chronic obstructive pulmonary disease)   . S/P aortic valve replacement 1990    a. St. Jude  . Chronic back pain   . Dysphagia   . Neuromuscular disorder   . Peripheral neuropathy   . Critical lower limb ischemia   . CAD (coronary artery disease)     a. 05/13/14 [name] s/p overlapping DESx2 to SVG to RCA. b.  s/p CABG '90 with redo '94 & stent to RCA SVG in 2005  . Chronic toe ulcer     a. left foot  . Shortness of breath   . Myocardial infarction   . CHF (congestive heart failure)   . Stone in kidney   . GERD (gastroesophageal reflux disease)   . Arthritis   . Sleep apnea     tested greater than 7 years ago per patient   Past Surgical History  Procedure Laterality Date  . Open heart surgery  1990    prosthetic heart valve, one bypass  . Rotator cuff repair      right  . Cataract extraction      bilateral  . Coronary stent placement  2005    RCA vein graft A 3.0x13.0 TAXUS stent was then placed int he vessel a Viva 3.0x4.0 (perfusion balloon was made ready it was placed through the entire lenght of the stent  . Peripheral vascular procedures lower extremities  right external iliac  artery PTA and stenting as well as bilateral SFA intervention remotely. Repeat procedures in 2011 bilaterally  . Coronary artery bypass graft  1994    6 vessels  . [name]  06/13/2011    Procedure: Venia Minks DILATION;  Surgeon: [name], MD;  Location: [facility];  Service: Endoscopy;  Laterality: N/A;  Dilated to 56.   . Angioplasty illiac artery    . Back surgery  [id number]    2  . Eye surgery    . Amputation Left 07/13/2014    Procedure: Transmetatarsal Amputation;  Surgeon: [name], MD;  Location: [place];  Service: Orthopedics;  Laterality: Left;  .  Lower extremity angiogram N/A 02/15/2013    Procedure: LOWER EXTREMITY ANGIOGRAM;  Surgeon: [name], MD;  Location: [facility] CATH LAB;  Service: Cardiovascular;  Laterality: N/A;  . Left heart catheterization with coronary angiogram N/A 05/11/2014    Procedure: LEFT HEART CATHETERIZATION WITH CORONARY ANGIOGRAM;  Surgeon: [name], MD;  Location: [facility] CATH LAB;  Service: Cardiovascular;  Laterality: N/A;  . Percutaneous coronary stent intervention (pci-s) N/A 05/13/2014    Procedure: PERCUTANEOUS CORONARY STENT INTERVENTION (PCI-S);  Surgeon: [name], MD;  Location: [facility] CATH LAB;  Service: Cardiovascular;  Laterality: N/A;  . Lower extremity angiogram N/A 06/06/2014    Procedure: LOWER EXTREMITY ANGIOGRAM;  Surgeon: [name], MD;  Location: [facility] CATH LAB;  Service: Cardiovascular;  Laterality: N/A;  . Amputation Left 08/20/2014    Procedure: Revision Transmetatarsal Amputation versus Below Knee Amputation;  Surgeon: [name], MD;  Location: [place];  Service: Orthopedics;  Laterality: Left;  . Stump revision Left 09/23/2014    Procedure: Revision Left Below Knee Amputation;  Surgeon: [name], MD;  Location: [place];  Service: Orthopedics;  Laterality: Left;  . Stump revision Left 10/13/2014    Procedure: REVISION LEFT BELOW KNEE AMPUTATION STUMP;  Surgeon: [name], MD;  Location: [facility];  Service: Orthopedics;  Laterality: Left;   Social History:  reports that he quit smoking about 8 months ago. His smoking use included Cigarettes. He started smoking about 61 years ago. He has a 55 pack-year smoking history. His smokeless tobacco use includes Chew. He reports that he does not drink alcohol or use illicit drugs.  No Known Allergies  Family History  Problem Relation Age of Onset  . Colon cancer Neg Hx   . Liver disease Neg Hx     Prior to Admission medications   Medication Sig Start Date End Date Taking? Authorizing Provider  acetaminophen  (TYLENOL) 500 MG tablet Take 500 mg by mouth every 6 (six) hours as needed for moderate pain.    Historical Provider, MD  albuterol (VOSPIRE ER) 4 MG 12 hr tablet Take 4 mg by mouth 2 (two) times daily.     Historical Provider, MD  albuterol-ipratropium (COMBIVENT) 18-103 MCG/ACT inhaler Inhale 1 puff into the lungs 4 (four) times daily. Coughing/ Shortness of Breath    Historical Provider, MD  allopurinol (ZYLOPRIM) 300 MG tablet Take 300 mg by mouth at bedtime.     Historical Provider, MD  Ascorbic Acid (VITAMIN C WITH ROSE HIPS) 1000 MG tablet Take 1,000 mg by mouth daily.    Historical Provider, MD  cephALEXin (KEFLEX) 500 MG capsule Take 1 capsule (500 mg total) by mouth 4 (four) times daily. 01/01/15   Belkys A Regalado, MD  cholecalciferol (VITAMIN D) 1000 UNITS tablet Take 2,000 Units by mouth  daily.    Historical Provider, MD  clopidogrel (PLAVIX) 75 MG tablet Take 1 tablet (75 mg total) by mouth daily with breakfast. 05/17/14   Eileen Stanford, PA-C  CRESTOR 20 MG tablet TAKE (1) TABLET BY MOUTH AT BEDTIME FOR CHOLESTEROL. 01/19/15   [name], MD  fenofibrate (TRICOR) 145 MG tablet TAKE 1 TABLET BY MOUTH ONCE DAILY FOR CHOLESTEROL. 01/19/15   [name], MD  fish oil-omega-3 fatty acids 1000 MG capsule Take 1 capsule (1 g total) by mouth 2 (two) times daily. 01/22/13   Casimiro Needle Alvstad, RPH-CPP  furosemide (LASIX) 80 MG tablet Take 40 mg by mouth daily.    Historical Provider, MD  glimepiride (AMARYL) 1 MG tablet Take 1 mg by mouth 2 (two) times daily.    Historical Provider, MD  isosorbide mononitrate (IMDUR) 30 MG 24 hr tablet TAKE ONE TABLET BY MOUTH ONCE DAILY. 10/25/14   Troy Sine, MD  metoprolol succinate (TOPROL-XL) 50 MG 24 hr tablet Take 1 tablet (50 mg total) by mouth daily. Take with or immediately following a meal. 05/17/14   Eileen Stanford, PA-C  nitroGLYCERIN (NITROSTAT) 0.4 MG SL tablet Place 1 tablet (0.4 mg total) under the tongue every 5 (five) minutes as  needed for chest pain. 05/17/14   Eileen Stanford, PA-C  oxyCODONE-acetaminophen (PERCOCET) 10-325 MG per tablet Take 1 tablet by mouth every 4 (four) hours as needed for pain. 09/23/14   [name], MD  pantoprazole (PROTONIX) 40 MG tablet Take 1 tablet (40 mg total) by mouth daily. 01/01/15   Belkys A Regalado, MD  potassium chloride SA (K-DUR,KLOR-CON) 20 MEQ tablet Take 20 mEq by mouth daily.      Historical Provider, MD  pregabalin (LYRICA) 50 MG capsule Take one capsule by mouth twice daily for pains 08/23/14   Blanchie Serve, MD  ramipril (ALTACE) 2.5 MG capsule TAKE 1 CAPSULE BY MOUTH AT BEDTIME. 09/27/14   [name], MD  sitaGLIPtin (JANUVIA) 50 MG tablet Take 50 mg by mouth daily.    Historical Provider, MD  warfarin (COUMADIN) 5 MG tablet Take 1-1.5 tablets (5-7.5 mg total) by mouth at bedtime. Takes one-half tablet (7.'5mg'$  total) on Fridays. Takes one tablet ('5mg'$  total) on all other days 01/01/15   Belkys A Regalado, MD  zinc gluconate 50 MG tablet Take 50 mg by mouth daily.    Historical Provider, MD   Physical Exam: Filed Vitals:   02/04/15 0715 02/04/15 0745 02/04/15 0749 02/04/15 0750  BP: 110/33 158/52    Pulse:   69   Temp:      TempSrc:      Resp: '15 18 17   '$ Height:      Weight:      SpO2: 100% 100% 100% 100%    Wt Readings from Last 3 Encounters:  02/04/15 90.719 kg (200 lb)  01/01/15 91.536 kg (201 lb 12.8 oz)  10/13/14 91.627 kg (202 lb)    General:  Appears comfortable on BIPAP, AAox3 Eyes: PERRL, normal lids, irises & conjunctiva ENT: grossly normal hearing, lips & tongue Neck: no LAD, masses or thyromegaly Cardiovascular: RRR, metallic click Telemetry: SR, no arrhythmias  Respiratory: bibasilar crackles Abdomen: soft, ntnd Skin: no rash or induration seen on limited exam Musculoskeletal: L BKA, 1plus edema on R Psychiatric: grossly normal mood and affect, speech fluent and appropriate Neurologic: grossly non-focal.          Labs on Admission:    Basic Metabolic Panel:  Recent Labs Lab 02/04/15 0415  NA 138  K 4.5  CL 106  CO2 20*  GLUCOSE 310*  BUN 25*  CREATININE 1.38*  CALCIUM 9.3   Liver Function Tests: No results for input(s): AST, ALT, ALKPHOS, BILITOT, PROT, ALBUMIN in the last 168 hours. No results for input(s): LIPASE, AMYLASE in the last 168 hours. No results for input(s): AMMONIA in the last 168 hours. CBC:  Recent Labs Lab 02/04/15 0415  WBC 17.2*  NEUTROABS 10.6*  HGB 7.3*  HCT 25.7*  MCV 72.0*  PLT 460*   Cardiac Enzymes:  Recent Labs Lab 02/04/15 0413  TROPONINI 0.05*    BNP (last 3 results)  Recent Labs  12/28/14 0134 02/04/15 0413  BNP 504.3* 593.2*    ProBNP (last 3 results)  Recent Labs  05/10/14 0229 05/11/14 1530  PROBNP 820.3* 1302.0*    CBG: No results for input(s): GLUCAP in the last 168 hours.  Radiological Exams on Admission: Dg Chest Portable 1 View  02/04/2015   CLINICAL DATA:  Shortness of breath.  EXAM: PORTABLE CHEST - 1 VIEW  COMPARISON:  12/28/2014  FINDINGS: Postoperative changes in the mediastinum. Cardiac enlargement with pulmonary vascular congestion and perihilar infiltrates suggesting edema. Linear atelectasis in the left mid lung. No definite pleural effusion although the entire costophrenic angles are not included within the field of view. No pneumothorax. Old resection or resorption of the distal right clavicle.  IMPRESSION: Cardiac enlargement with pulmonary vascular congestion and perihilar edema.   Electronically Signed   By: Lucienne Capers M.D.   On: 02/04/2015 04:33    EKG: Independently reviewed. NSR, non specific ST T wave changes, LBBB  Assessment/Plan   Acute respiratory failure -likely due to CHF exacerbation, also has underlying COPD and CAD -wean off BIPAP this am   Acute on chronic systolic and Diastolic CHF -EF 25% on ECHo 5/16 -suspect anemia also contributing, will diurese with IV lasix -also transfuse 1 unit PRBC -also  cycle cardiac enzymes  CAD/CABG 90 and re-do CABG 94, and PCI to vein grafts -last myoview 12/14 without ischemia -PCI of SVG to right coronary artery in 9/15 -continue plavix, Toprol, statin, imdur  Chest pain  -resolved, wean off nitro gtt -slightly elevated troponin, could be due to demand from CHF, cycle enzymes -due to underlying CAD will ask Cards to see -continue plavix, BB, statin   Anemia  -has chronic anemia, baseline of 8gm/dl , now 7.3 -check anemia panel, hemoccult stool, no overt bleeding -transfuse 1 unit PRBC  -unfortunately needs to be on warfarin and plavix  Leukocytosis -etiology unclear, ? Reactive, monitor for now -not wheezing, no s/s of active infection at this time,    H/O aortic valve replacement- St Jude -warfarin per pharmacy    PVD prior SFA PTA with chronic LE disease,       L BKA    DM2 (diabetes mellitus, type 2) -hold oral hypoglycemics, SSI for now    LBBB (left bundle branch block)  DVT Prophylaxis: on wafarin  Code Status: DNR Family Communication: none at bedside Disposition Plan: SDU   Time spent: 70mn  Peachie Barkalow [facility] Pager [phone]

## 2015-02-04 NOTE — Progress Notes (Signed)
ANTICOAGULATION CONSULT NOTE - Initial Consult  Pharmacy Consult for Coumadin  Indication: Mechanical aortic valve   No Known Allergies  Patient Measurements: Height: '5\' 8"'$  (172.7 cm) Weight: 200 lb (90.719 kg) IBW/kg (Calculated) : 68.4  Vital Signs: Temp: 96.4 F (35.8 C) (06/18 0416) Temp Source: Temporal (06/18 0416) BP: 147/43 mmHg (06/18 0833) Pulse Rate: 64 (06/18 0833)  Labs:  Recent Labs  02/04/15 0413 02/04/15 0415  HGB  --  7.3*  HCT  --  25.7*  PLT  --  460*  APTT  --  36  LABPROT  --  27.6*  INR  --  2.62*  CREATININE  --  1.38*  TROPONINI 0.05*  --     Estimated Creatinine Clearance: 51.3 mL/min (by C-G formula based on Cr of 1.38).   Medical History: Past Medical History  Diagnosis Date  . Diabetes mellitus 2007  . Hypertension   . Gout   . Hypercholesteremia   . Peripheral vascular disease     stents lower extremity  . COPD (chronic obstructive pulmonary disease)   . S/P aortic valve replacement 1990    a. St. Jude  . Chronic back pain   . Dysphagia   . Neuromuscular disorder   . Peripheral neuropathy   . Critical lower limb ischemia   . CAD (coronary artery disease)     a. 05/13/14 [name] s/p overlapping DESx2 to SVG to RCA. b.  s/p CABG '90 with redo '94 & stent to RCA SVG in 2005  . Chronic toe ulcer     a. left foot  . Shortness of breath   . Myocardial infarction   . CHF (congestive heart failure)   . Stone in kidney   . GERD (gastroesophageal reflux disease)   . Arthritis   . Sleep apnea     tested greater than 7 years ago per patient    Medications:  Prescriptions prior to admission  Medication Sig Dispense Refill Last Dose  . acetaminophen (TYLENOL) 500 MG tablet Take 500 mg by mouth every 6 (six) hours as needed for moderate pain.   Past Month at Unknown time  . albuterol (VOSPIRE ER) 4 MG 12 hr tablet Take 4 mg by mouth 2 (two) times daily.    12/27/2014 at Unknown time  . albuterol-ipratropium (COMBIVENT) 18-103 MCG/ACT  inhaler Inhale 1 puff into the lungs 4 (four) times daily. Coughing/ Shortness of Breath   12/27/2014 at Unknown time  . allopurinol (ZYLOPRIM) 300 MG tablet Take 300 mg by mouth at bedtime.    12/27/2014 at Unknown time  . Ascorbic Acid (VITAMIN C WITH ROSE HIPS) 1000 MG tablet Take 1,000 mg by mouth daily.   12/27/2014 at Unknown time  . cephALEXin (KEFLEX) 500 MG capsule Take 1 capsule (500 mg total) by mouth 4 (four) times daily. 4 capsule 0   . cholecalciferol (VITAMIN D) 1000 UNITS tablet Take 2,000 Units by mouth daily.   12/27/2014 at Unknown time  . clopidogrel (PLAVIX) 75 MG tablet Take 1 tablet (75 mg total) by mouth daily with breakfast. 30 tablet 11 12/27/2014 at Unknown time  . CRESTOR 20 MG tablet TAKE (1) TABLET BY MOUTH AT BEDTIME FOR CHOLESTEROL. 30 tablet 3   . fenofibrate (TRICOR) 145 MG tablet TAKE 1 TABLET BY MOUTH ONCE DAILY FOR CHOLESTEROL. 30 tablet 3   . fish oil-omega-3 fatty acids 1000 MG capsule Take 1 capsule (1 g total) by mouth 2 (two) times daily.   12/27/2014 at Unknown time  .  furosemide (LASIX) 80 MG tablet Take 40 mg by mouth daily.   12/27/2014 at Unknown time  . glimepiride (AMARYL) 1 MG tablet Take 1 mg by mouth 2 (two) times daily.   12/27/2014 at Unknown time  . isosorbide mononitrate (IMDUR) 30 MG 24 hr tablet TAKE ONE TABLET BY MOUTH ONCE DAILY. 30 tablet 5 12/27/2014 at Unknown time  . metoprolol succinate (TOPROL-XL) 50 MG 24 hr tablet Take 1 tablet (50 mg total) by mouth daily. Take with or immediately following a meal. 30 tablet 11 12/27/2014 at 0800  . nitroGLYCERIN (NITROSTAT) 0.4 MG SL tablet Place 1 tablet (0.4 mg total) under the tongue every 5 (five) minutes as needed for chest pain. 25 tablet 12 unknown  . oxyCODONE-acetaminophen (PERCOCET) 10-325 MG per tablet Take 1 tablet by mouth every 4 (four) hours as needed for pain. 30 tablet 0 12/27/2014 at Unknown time  . pantoprazole (PROTONIX) 40 MG tablet Take 1 tablet (40 mg total) by mouth daily. 30 tablet 0    . potassium chloride SA (K-DUR,KLOR-CON) 20 MEQ tablet Take 20 mEq by mouth daily.     12/27/2014 at Unknown time  . pregabalin (LYRICA) 50 MG capsule Take one capsule by mouth twice daily for pains 60 capsule 5 12/27/2014 at Unknown time  . ramipril (ALTACE) 2.5 MG capsule TAKE 1 CAPSULE BY MOUTH AT BEDTIME. 30 capsule 7 12/27/2014 at Unknown time  . sitaGLIPtin (JANUVIA) 50 MG tablet Take 50 mg by mouth daily.   12/27/2014 at Unknown time  . warfarin (COUMADIN) 5 MG tablet Take 1-1.5 tablets (5-7.5 mg total) by mouth at bedtime. Takes one-half tablet (7.'5mg'$  total) on Fridays. Takes one tablet ('5mg'$  total) on all other days 30 tablet 11   . zinc gluconate 50 MG tablet Take 50 mg by mouth daily.   12/27/2014 at Unknown time    Assessment: Tanner Pena with a St. Jude mechanical aortic valve presented to the ED with acute dyspnea and chest pain. Pharmacy consulted to resume home Coumadin therapy. INR today is therapeutic at 2.62. Patient has chronic anemia with low H/H on admission. Transfusing 1 unit PRBCs.   Home Coumadin Dose: 2.5 mg on Mon/Fri; 5 mg on all other days   Goal of Therapy:  INR 2.5-3.5 (per Coumadin clinic notes)  Monitor platelets by anticoagulation protocol: Yes   Plan:  -Resume home Coumadin dose  -Monitor daily PT/INR -Follow closely for s/s of bleeding   Albertina Parr, PharmD., BCPS Clinical Pharmacist Pager [phone]

## 2015-02-05 ENCOUNTER — Inpatient Hospital Stay (HOSPITAL_COMMUNITY): Payer: Medicare Other

## 2015-02-05 DIAGNOSIS — D509 Iron deficiency anemia, unspecified: Secondary | ICD-10-CM | POA: Diagnosis present

## 2015-02-05 DIAGNOSIS — J9601 Acute respiratory failure with hypoxia: Secondary | ICD-10-CM | POA: Insufficient documentation

## 2015-02-05 DIAGNOSIS — I214 Non-ST elevation (NSTEMI) myocardial infarction: Secondary | ICD-10-CM | POA: Insufficient documentation

## 2015-02-05 DIAGNOSIS — I5023 Acute on chronic systolic (congestive) heart failure: Secondary | ICD-10-CM | POA: Insufficient documentation

## 2015-02-05 LAB — BASIC METABOLIC PANEL
Anion gap: 9 (ref 5–15)
BUN: 28 mg/dL — AB (ref 6–20)
CO2: 25 mmol/L (ref 22–32)
Calcium: 9.4 mg/dL (ref 8.9–10.3)
Chloride: 103 mmol/L (ref 101–111)
Creatinine, Ser: 1.43 mg/dL — ABNORMAL HIGH (ref 0.61–1.24)
GFR, EST AFRICAN AMERICAN: 54 mL/min — AB (ref >60)
GFR, EST NON AFRICAN AMERICAN: 47 mL/min — AB (ref >60)
GLUCOSE: 165 mg/dL — AB (ref 65–99)
POTASSIUM: 3.9 mmol/L (ref 3.5–5.1)
SODIUM: 137 mmol/L (ref 135–145)

## 2015-02-05 LAB — CBC
HCT: 28.3 % — ABNORMAL LOW (ref 39.0–52.0)
HCT: 27.3 % — ABNORMAL LOW (ref 39.0–52.0)
HEMATOCRIT: 24.6 % — AB (ref 39.0–52.0)
HEMOGLOBIN: 8.2 g/dL — AB (ref 13.0–17.0)
HEMOGLOBIN: 8.1 g/dL — AB (ref 13.0–17.0)
Hemoglobin: 7.1 g/dL — ABNORMAL LOW (ref 13.0–17.0)
MCH: 20.5 pg — ABNORMAL LOW (ref 26.0–34.0)
MCH: 21.2 pg — ABNORMAL LOW (ref 26.0–34.0)
MCH: 21.6 pg — ABNORMAL LOW (ref 26.0–34.0)
MCHC: 28.9 g/dL — AB (ref 30.0–36.0)
MCHC: 29 g/dL — ABNORMAL LOW (ref 30.0–36.0)
MCHC: 29.7 g/dL — ABNORMAL LOW (ref 30.0–36.0)
MCV: 70.9 fL — ABNORMAL LOW (ref 78.0–100.0)
MCV: 73.3 fL — ABNORMAL LOW (ref 78.0–100.0)
MCV: 72.8 fL — AB (ref 78.0–100.0)
PLATELETS: 305 10*3/uL (ref 150–400)
Platelets: 327 10*3/uL (ref 150–400)
Platelets: 318 10*3/uL (ref 150–400)
RBC: 3.47 MIL/uL — ABNORMAL LOW (ref 4.22–5.81)
RBC: 3.86 MIL/uL — AB (ref 4.22–5.81)
RBC: 3.75 MIL/uL — AB (ref 4.22–5.81)
RDW: 17.8 % — ABNORMAL HIGH (ref 11.5–15.5)
RDW: 18.4 % — AB (ref 11.5–15.5)
RDW: 18.5 % — ABNORMAL HIGH (ref 11.5–15.5)
WBC: 10.2 10*3/uL (ref 4.0–10.5)
WBC: 9.7 10*3/uL (ref 4.0–10.5)
WBC: 8.7 10*3/uL (ref 4.0–10.5)

## 2015-02-05 LAB — GLUCOSE, CAPILLARY
GLUCOSE-CAPILLARY: 158 mg/dL — AB (ref 65–99)
GLUCOSE-CAPILLARY: 217 mg/dL — AB (ref 65–99)
Glucose-Capillary: 171 mg/dL — ABNORMAL HIGH (ref 65–99)
Glucose-Capillary: 157 mg/dL — ABNORMAL HIGH (ref 65–99)

## 2015-02-05 LAB — TROPONIN I: TROPONIN I: 1.09 ng/mL — AB (ref <0.031)

## 2015-02-05 LAB — PREPARE RBC (CROSSMATCH)

## 2015-02-05 LAB — PROTIME-INR
INR: 2.59 — ABNORMAL HIGH (ref 0.00–1.49)
Prothrombin Time: 27.4 seconds — ABNORMAL HIGH (ref 11.6–15.2)

## 2015-02-05 MED ORDER — PANTOPRAZOLE SODIUM 40 MG IV SOLR
40.0000 mg | Freq: Two times a day (BID) | INTRAVENOUS | Status: DC
  Administered 2015-02-05 – 2015-02-09 (×8): 40 mg via INTRAVENOUS
  Filled 2015-02-05 (×8): qty 40

## 2015-02-05 MED ORDER — ALUM & MAG HYDROXIDE-SIMETH 200-200-20 MG/5ML PO SUSP
30.0000 mL | Freq: Four times a day (QID) | ORAL | Status: DC | PRN
  Administered 2015-02-05: 30 mL via ORAL
  Filled 2015-02-05: qty 30

## 2015-02-05 MED ORDER — IOHEXOL 300 MG/ML  SOLN
25.0000 mL | INTRAMUSCULAR | Status: AC
  Administered 2015-02-05 (×2): 25 mL via ORAL

## 2015-02-05 MED ORDER — SUCRALFATE 1 GM/10ML PO SUSP
1.0000 g | Freq: Three times a day (TID) | ORAL | Status: DC
  Administered 2015-02-05 – 2015-02-12 (×26): 1 g via ORAL
  Filled 2015-02-05 (×26): qty 10

## 2015-02-05 MED ORDER — SODIUM CHLORIDE 0.9 % IV SOLN: Freq: Once | INTRAVENOUS | Status: DC

## 2015-02-05 NOTE — Consult Note (Signed)
Subjective:   HPI  The patient is a 74 year old male with extensive past history of coronary artery disease with a history of having a 6 vessel coronary artery bypass graft in 1990 and also a St. Jude aortic valve replacement. He has been on chronic Coumadin and Plavix therapy. He was admitted to the hospital 3 weeks ago with congestive heart failure. He was admitted again yesterday to this hospital with acute dyspnea and chest pain and again found to have congestive heart failure.  We are asked to see him in regards to iron deficiency anemia. Hemoglobin on admission 7.3. B-12 and folate normal. Iron studies consistent with iron deficiency. Patient denies blood per rectum or melena. Denies hematemesis. States that for the past 3 weeks he has been experiencing mid periumbilical abdominal discomfort which he describes as burning in character. The only thing that helps his when he drinks buttermilk. He had an EGD in October 2012 showing an antral erosion. He had a colonoscopy in 2012 showing diverticulosis, descending colon ulceration, and a diminutive polyp from the rectum which was removed.  Review of Systems As above. Patient feels better today  Past Medical History  Diagnosis Date  . Diabetes mellitus 2007  . Hypertension   . Gout   . Hypercholesteremia   . Peripheral vascular disease     stents lower extremity  . COPD (chronic obstructive pulmonary disease)   . S/P aortic valve replacement 1990    a. St. Jude  . Chronic back pain   . Dysphagia   . Neuromuscular disorder   . Peripheral neuropathy   . Critical lower limb ischemia   . CAD (coronary artery disease)     a. 05/13/14 [name] s/p overlapping DESx2 to SVG to RCA. b.  s/p CABG '90 with redo '94 & stent to RCA SVG in 2005  . Chronic toe ulcer     a. left foot  . Shortness of breath   . Myocardial infarction   . CHF (congestive heart failure)   . Stone in kidney   . GERD (gastroesophageal reflux disease)   . Arthritis   . Sleep  apnea     tested greater than 7 years ago per patient   Past Surgical History  Procedure Laterality Date  . Open heart surgery  1990    prosthetic heart valve, one bypass  . Rotator cuff repair      right  . Cataract extraction      bilateral  . Coronary stent placement  2005    RCA vein graft A 3.0x13.0 TAXUS stent was then placed int he vessel a Viva 3.0x4.0 (perfusion balloon was made ready it was placed through the entire lenght of the stent  . Peripheral vascular procedures lower extremities      right external iliac  artery PTA and stenting as well as bilateral SFA intervention remotely. Repeat procedures in 2011 bilaterally  . Coronary artery bypass graft  1994    6 vessels  . [name]  06/13/2011    Procedure: Venia Minks DILATION;  Surgeon: [name], MD;  Location: [facility];  Service: Endoscopy;  Laterality: N/A;  Dilated to 56.   . Angioplasty illiac artery    . Back surgery  [id number]    2  . Eye surgery    . Amputation Left 07/13/2014    Procedure: Transmetatarsal Amputation;  Surgeon: [name], MD;  Location: [place];  Service: Orthopedics;  Laterality: Left;  . Lower extremity angiogram N/A 02/15/2013  Procedure: LOWER EXTREMITY ANGIOGRAM;  Surgeon: [name], MD;  Location: [facility] CATH LAB;  Service: Cardiovascular;  Laterality: N/A;  . Left heart catheterization with coronary angiogram N/A 05/11/2014    Procedure: LEFT HEART CATHETERIZATION WITH CORONARY ANGIOGRAM;  Surgeon: [name], MD;  Location: [facility] CATH LAB;  Service: Cardiovascular;  Laterality: N/A;  . Percutaneous coronary stent intervention (pci-s) N/A 05/13/2014    Procedure: PERCUTANEOUS CORONARY STENT INTERVENTION (PCI-S);  Surgeon: [name], MD;  Location: [facility] CATH LAB;  Service: Cardiovascular;  Laterality: N/A;  . Lower extremity angiogram N/A 06/06/2014    Procedure: LOWER EXTREMITY ANGIOGRAM;  Surgeon: [name], MD;  Location: [facility] CATH LAB;  Service:  Cardiovascular;  Laterality: N/A;  . Amputation Left 08/20/2014    Procedure: Revision Transmetatarsal Amputation versus Below Knee Amputation;  Surgeon: [name], MD;  Location: [place];  Service: Orthopedics;  Laterality: Left;  . Stump revision Left 09/23/2014    Procedure: Revision Left Below Knee Amputation;  Surgeon: [name], MD;  Location: [place];  Service: Orthopedics;  Laterality: Left;  . Stump revision Left 10/13/2014    Procedure: REVISION LEFT BELOW KNEE AMPUTATION STUMP;  Surgeon: [name], MD;  Location: [facility];  Service: Orthopedics;  Laterality: Left;   History   Social History  . Marital Status: Legally Separated    Spouse Name: N/A  . Number of Children: 5  . Years of Education: N/A   Occupational History  . retired     Stage manager   Social History Main Topics  . Smoking status: Former Smoker -- 1.00 packs/day for 55 years    Types: Cigarettes    Start date: 08/19/1953    Quit date: 05/07/2014  . Smokeless tobacco: Current User    Types: Chew    Last Attempt to Quit: 08/20/1995     Comment: Has quit on 3 occasions. Counseling given today 5-10 minutes   I am more than likely going to quit "  . Alcohol Use: No     Comment: socially, sometimes 12 ounce beer daily, may go month without/no whiskey  . Drug Use: No  . Sexual Activity: Not on file   Other Topics Concern  . Not on file   Social History Narrative   Has 3 daughters   Has 2 sons   family history is negative for Colon cancer and Liver disease.  Current facility-administered medications:  .  0.9 %  sodium chloride infusion, , Intravenous, Once, Dianne Dun, NP .  acetaminophen (TYLENOL) tablet 650 mg, 650 mg, Oral, Q6H PRN **OR** acetaminophen (TYLENOL) suppository 650 mg, 650 mg, Rectal, Q6H PRN, Domenic Polite, MD .  albuterol (PROVENTIL) (2.5 MG/3ML) 0.083% nebulizer solution 2.5 mg, 2.5 mg, Nebulization, Q2H PRN, Domenic Polite, MD .  allopurinol (ZYLOPRIM)  tablet 300 mg, 300 mg, Oral, QHS, Domenic Polite, MD, 300 mg at 02/04/15 2110 .  clopidogrel (PLAVIX) tablet 75 mg, 75 mg, Oral, Q breakfast, Domenic Polite, MD, 75 mg at 02/05/15 5400 .  fenofibrate tablet 160 mg, 160 mg, Oral, Daily, Domenic Polite, MD, 160 mg at 02/05/15 0837 .  furosemide (LASIX) injection 40 mg, 40 mg, Intravenous, Q12H, Domenic Polite, MD, 40 mg at 02/05/15 8676 .  insulin aspart (novoLOG) injection 0-9 Units, 0-9 Units, Subcutaneous, TID WC, Domenic Polite, MD, 2 Units at 02/05/15 1239 .  isosorbide mononitrate (IMDUR) 24 hr tablet 30 mg, 30 mg, Oral, Daily, Domenic Polite, MD, 30 mg at 02/05/15 0837 .  metoprolol succinate (TOPROL-XL) 24 hr tablet 50 mg, 50 mg, Oral, Daily, Domenic Polite, MD, 50 mg at 02/05/15 6286 .  nitroGLYCERIN (NITROSTAT) SL tablet 0.4 mg, 0.4 mg, Sublingual, Q5 min PRN, Domenic Polite, MD .  ondansetron (ZOFRAN) tablet 4 mg, 4 mg, Oral, Q6H PRN **OR** ondansetron (ZOFRAN) injection 4 mg, 4 mg, Intravenous, Q6H PRN, Domenic Polite, MD .  oxyCODONE-acetaminophen (PERCOCET/ROXICET) 5-325 MG per tablet 1 tablet, 1 tablet, Oral, Q4H PRN **AND** oxyCODONE (Oxy IR/ROXICODONE) immediate release tablet 5 mg, 5 mg, Oral, Q4H PRN, Domenic Polite, MD .  pantoprazole (PROTONIX) injection 40 mg, 40 mg, Intravenous, Q12H, Nishant Dhungel, MD .  potassium chloride SA (K-DUR,KLOR-CON) CR tablet 20 mEq, 20 mEq, Oral, BID, Domenic Polite, MD, 20 mEq at 02/05/15 3817 .  pregabalin (LYRICA) capsule 50 mg, 50 mg, Oral, BID, Domenic Polite, MD, 50 mg at 02/05/15 7116 .  rosuvastatin (CRESTOR) tablet 20 mg, 20 mg, Oral, q1800, Domenic Polite, MD, 20 mg at 02/04/15 1838 .  [START ON 02/06/2015] warfarin (COUMADIN) tablet 2.5 mg, 2.5 mg, Oral, Once per day on Mon Fri, Benjamin G Mancheril, RPH .  Warfarin - Pharmacist Dosing Inpatient, , Does not apply, q1800, Lavenia Atlas, RPH, 0  at 02/04/15 1846 No Known Allergies   Objective:     BP 129/50 mmHg  Pulse 64   Temp(Src) 99 F (37.2 C) (Oral)  Resp 20  Ht '5\' 8"'$  (1.727 m)  Wt 91.4 kg (201 lb 8 oz)  BMI 30.65 kg/m2  SpO2 98%  He is in no distress  Nonicteric  Heart regular rhythm  Lungs clear  Abdomen: Bowel sounds normal, soft, mild discomfort in the umbilical area without rebound or guarding  Laboratory No components found for: D1    Assessment:     #1. Iron deficiency anemia etiology unclear  #2. Mid abdominal pain for the past 3 weeks etiology unclear      Plan:     Coumadin and Plavix have been held. Blood transfusion given. Try to keep hemoglobin above 8 or 9. PPI therapy. Consider further GI evaluation once he is more stable from a cardiac standpoint and coagulopathy corrected. Given his significant cardiac history would discuss further GI evaluation with cardiology to see if they feel endoscopic evaluation would be warranted versus radiological evaluation. Lab Results  Component Value Date   HGB 8.2* 02/05/2015   HGB 7.1* 02/05/2015   HGB 7.3* 02/04/2015   HCT 28.3* 02/05/2015   HCT 24.6* 02/05/2015   HCT 25.7* 02/04/2015   ALKPHOS 30* 12/28/2014   ALKPHOS 26* 09/23/2014   ALKPHOS 25* 07/13/2014   AST 26 12/28/2014   AST 22 09/23/2014   AST 15 07/13/2014   ALT 13* 12/28/2014   ALT 12 09/23/2014   ALT 8 07/13/2014

## 2015-02-05 NOTE — Progress Notes (Signed)
Subjective:  No further chest pain, no dyspnea.  Objective:  Vital Signs in the last 24 hours: Temp:  [97.9 F (36.6 C)-99.7 F (37.6 C)] 98.2 F (36.8 C) (06/19 0830) Pulse Rate:  [54-70] 65 (06/19 0838) Resp:  [17-28] 21 (06/19 0830) BP: (103-137)/(34-65) 135/65 mmHg (06/19 0830) SpO2:  [96 %-100 %] 96 % (06/19 0830) Weight:  [201 lb 8 oz (91.4 kg)] 201 lb 8 oz (91.4 kg) (06/19 0433)  Intake/Output from previous day: 06/18 0701 - 06/19 0700 In: 12 [P.O.:240; Blood:330] Out: 2600 [Urine:2600]   Physical Exam: General: Well developed, well nourished, in no acute distress. Head:  Normocephalic and atraumatic. Lungs: Clear to auscultation and percussion. Heart: Normal S1 and S2.  No murmur, rubs or gallops.  Abdomen: soft, non-tender, positive bowel sounds. Extremities: No clubbing or cyanosis. No edema. Left BKA Neurologic: Alert and oriented x 3.    Lab Results:  Recent Labs  02/04/15 0415 02/05/15 0503  WBC 17.2* 10.2  HGB 7.3* 7.1*  PLT 460* 327    Recent Labs  02/04/15 0415  NA 138  K 4.5  CL 106  CO2 20*  GLUCOSE 310*  BUN 25*  CREATININE 1.38*    Recent Labs  02/04/15 1033 02/04/15 1518  TROPONINI 0.68* 1.50*   Hepatic Function Panel No results for input(s): PROT, ALBUMIN, AST, ALT, ALKPHOS, BILITOT, BILIDIR, IBILI in the last 72 hours. No results for input(s): CHOL in the last 72 hours. No results for input(s): PROTIME in the last 72 hours.  Imaging: Dg Chest Portable 1 View  02/04/2015   CLINICAL DATA:  Shortness of breath.  EXAM: PORTABLE CHEST - 1 VIEW  COMPARISON:  12/28/2014  FINDINGS: Postoperative changes in the mediastinum. Cardiac enlargement with pulmonary vascular congestion and perihilar infiltrates suggesting edema. Linear atelectasis in the left mid lung. No definite pleural effusion although the entire costophrenic angles are not included within the field of view. No pneumothorax. Old resection or resorption of the distal  right clavicle.  IMPRESSION: Cardiac enlargement with pulmonary vascular congestion and perihilar edema.   Electronically Signed   By: Lucienne Capers M.D.   On: 02/04/2015 04:33   Personally viewed.   Telemetry: Frequent PVCs. Personally viewed.   EKG:  Chronic left bundle branch block  Cardiac Studies:  Echocardiogram 12/29/14: - Left ventricle: Inferior, septal and apical hypokinesis. The cavity size was mildly dilated. Wall thickness was increased in a pattern of moderate LVH. The estimated ejection fraction was 40%. - Aortic valve: Mechanical AVR with no periporsthetic regurgitation. Gradients elevated but actually lower than 2015 when peak velocity was 3.5 m/sec and peak gradient was 49 mmHg. Valve area (VTI): 0.88 cm^2. Valve area (Vmax): 0.63 cm^2. Valve area (Vmean): 0.94 cm^2. - Left atrium: The atrium was moderately dilated. - Atrial septum: There was increased thickness of the septum, consistent with lipomatous hypertrophy.  Assessment/Plan:  Active Problems:   H/O aortic valve replacement- St Jude   CAD - CABG '90 with re do '94    PVD prior SFA PTA with chronic LE disease, not amenable to PTA   DM2 (diabetes mellitus, type 2)   Cardiomyopathy, ischemic - EF 25-30% by echo 5/14   COPD (chronic obstructive pulmonary disease)   LBBB (left bundle branch block)   S/P below knee amputation   Acute respiratory failure   CHF exacerbation    74 year old Caucasian male with past medical history of CAD s/p 6v CABG in 1990 with St. 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  1. Severe anemia-hemoglobin remains in the 7 range. I recommend transfusing up to hemoglobin of 9. He is not currently having any anginal symptoms. Anemia could be playing a role however and his previous chest pain symptom. He also states  that he has been having stomach pain and he forgot to tell us this yesterday. Could there be a gastric ulcer?. Per primary team.  2. Elevated troponin-0.6-likely demand ischemia in the setting of anemia. No further invasive therapy at this time.  3. Coronary disease-6 vessel bypass with redo in 1994, St. Jude mechanical aortic valve. Needs chronic anticoagulation because of mechanical aortic valve.  4. Peripheral arterial disease-bilateral stents SFA, left BKA  We will continue to follow.   SKAINS, [place] 02/05/2015, 11:41 AM

## 2015-02-05 NOTE — Progress Notes (Signed)
[facility] PROGRESS NOTE  THERIN VETSCH KKX:[id number] DOB: 12/30/1940 DOA: 02/04/2015 PCP: [name], MD  Brief narrative 74 year old male with history of CAD s/p CABG, chronic systolic and diastolic heart failure, s/p AVR withSt. 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 Patient was found to be in acute respiratory distress and placed on BiPAP. Chest x-ray showed pulmonary edema with significant leukocytosis and hemoglobin of 7.3.  Assessment/Plan Acute hypoxic respiratory failure CHF exacerbation. Patient renal BiPAP maintaining O2 sat on 2 L nasal cannula. Continue IV diuresis.   Acute on chronic combined systolic and diastolic CHF Last echo with EF 40%. Symptoms also worsened with underlying anemia.  Continue  IV Lasix. Monitor strict I/O and daily weight.  Chest pain with elevated troponin NSTEMI vs demand ischemia. Required nitroglycerin drip upon admission. As extensive coronary artery disease. Currently chest pain-free. Troponin further elevated. Will follow repeat this morning and discuss further with cardiology. Continue metoprolol, statin Imdur and when necessary nitrates. On Plavix which will need to be held given suspicion for GI bleed. Myoview in 12/14 without ischemia. PCI of SVG to right coronary artery in 9/15  Iron deficiency anemia Baseline hemoglobin 5 months back of 11-12 which has progressively dropped to 7 this admission. Iron panel suggest iron deficiency. Hemoccults pending. Asked her 1 unit PRBC and repeat H&H pending. Concern for GI bleed as patient also complains of epigastric pain for the past 2 weeks. I will place him on IV PPI twice a day. Discussed with cardiology and  hold his Plavix. Will hold Coumadin and switch to IV heparin. Monitor H&H closely  Diabetes mellitus type 2 Or lingual oral hypoglycemics. Monitor on sliding scale insulin  History of aortic valve replacement  On Coumadin with therapeutic INR. We discussed with cardiology to hold Coumadin or cystoscopy IV heparin given concern for GI bleed.  Peripheral vascular disease prior SFA PTA with chronic LE disease. On Plavix which may need to the held due to possible GI bleed.  Diet: Heart healthy/diabetic  DVT prophylaxis: On Coumadin  Code Status: DNR Family Communication: none at bedside  Disposition Plan: currently inpt   Consultants:  Cardiology  [facility]  Procedures:  None  Antibiotics:  None  HPI/Subjective: -Further chest pain. Dyspnea improved. Reported later that he has been having epigastric pain for the past 2 weeks. No nausea vomiting or hemoptysis. Denies hematochezia or dark stools. Receive 1 u PRBC this morning. Noted for progressive decline in hemoglobin for the past 5 months  Objective: Filed Vitals:   02/05/15 0838  BP:   Pulse: 65  Temp:   Resp:     Intake/Output Summary (Last 24 hours) at 02/05/15 1235 Last data filed at 02/05/15 1214  Gross per 24 hour  Intake   1260 ml  Output   3150 ml  Net  -1890 ml   Filed Weights   02/04/15 0416 02/05/15 0433  Weight: 90.719 kg (200 lb) 91.4 kg (201 lb 8 oz)    Exam:   General:  Elderly male in no acute distress    HEENT: Pallor present, moist mucosa, supple neck, no JVD  Chest: Clear to auscultation bilaterally  CVS: Normal S1 and S2, systolic click, no murmurs  GI: Soft, nondistended, nontender, bowel sounds present  Musculoskeletal: Left BKA, this pitting edema  CNS: Alert and oriented  Data Reviewed: Basic Metabolic Panel:  Recent Labs Lab 02/04/15 0415  NA 138  K 4.5  CL 106  CO2 20*  GLUCOSE 310*  BUN 25*  CREATININE 1.38*  CALCIUM 9.3   Liver Function Tests: No  results for input(s): AST, ALT, ALKPHOS, BILITOT, PROT, ALBUMIN in the last 168 hours. No results for input(s): LIPASE, AMYLASE in the last 168 hours. No results for input(s): AMMONIA in the last 168 hours. CBC:  Recent Labs Lab 02/04/15 0415 02/05/15 0503  WBC 17.2* 10.2  NEUTROABS 10.6*  --   HGB 7.3* 7.1*  HCT 25.7* 24.6*  MCV 72.0* 70.9*  PLT 460* 327   Cardiac Enzymes:  Recent Labs Lab 02/04/15 0413 02/04/15 1033 02/04/15 1518  TROPONINI 0.05* 0.68* 1.50*   BNP (last 3 results)  Recent Labs  12/28/14 0134 02/04/15 0413  BNP 504.3* 593.2*    ProBNP (last 3 results)  Recent Labs  05/10/14 0229 05/11/14 1530  PROBNP 820.3* 1302.0*    CBG:  Recent Labs Lab 02/04/15 1216 02/04/15 1700 02/04/15 2040 02/05/15 0756 02/05/15 1202  GLUCAP 188* 162* 150* 171* 158*    Recent Results (from the past 240 hour(s))  MRSA PCR Screening     Status: None   Collection Time: 02/04/15 11:26 AM  Result Value Ref Range Status   MRSA by PCR NEGATIVE NEGATIVE Final    Comment:        The GeneXpert MRSA Assay (FDA approved for NASAL specimens only), is one component of a comprehensive MRSA colonization surveillance program. It is not intended to diagnose MRSA infection nor to guide or monitor treatment for MRSA infections.      Studies: Dg Chest Portable 1 View  02/04/2015   CLINICAL DATA:  Shortness of breath.  EXAM: PORTABLE CHEST - 1 VIEW  COMPARISON:  12/28/2014  FINDINGS: Postoperative changes in the mediastinum. Cardiac enlargement with pulmonary vascular congestion and perihilar infiltrates suggesting edema. Linear atelectasis in the left mid lung. No definite pleural effusion although the entire costophrenic angles are not included within the field of view. No pneumothorax. Old resection or resorption of the distal right clavicle.  IMPRESSION: Cardiac enlargement with pulmonary vascular congestion and perihilar edema.   Electronically Signed   By: Lucienne Capers M.D.   On: 02/04/2015 04:33    Scheduled Meds: . sodium chloride   Intravenous Once  . allopurinol  300 mg Oral QHS  . clopidogrel  75 mg Oral Q breakfast  . fenofibrate  160 mg Oral Daily  . furosemide  40 mg Intravenous Q12H  . insulin aspart  0-9 Units Subcutaneous TID WC  . isosorbide mononitrate  30 mg Oral Daily  . metoprolol succinate  50 mg Oral Daily  . pantoprazole  40 mg Oral Daily  . potassium chloride SA  20 mEq Oral BID  . pregabalin  50 mg Oral BID  . rosuvastatin  20 mg Oral q1800  . [START ON 02/06/2015] warfarin  2.5 mg Oral Once per day on Mon Fri  . Warfarin - Pharmacist Dosing Inpatient   Does not apply q1800   Continuous Infusions:     Time spent: 35 minutes    Bob Eastwood, [place]  [facility] Pager [phone]. If 7PM-7AM, please contact night-coverage at [email or web address], password [facility] 02/05/2015, 12:35 PM  LOS: 1 day

## 2015-02-06 ENCOUNTER — Telehealth: Payer: Self-pay | Admitting: *Deleted

## 2015-02-06 ENCOUNTER — Ambulatory Visit: Payer: Medicare Other | Admitting: Cardiovascular Disease

## 2015-02-06 DIAGNOSIS — K5289 Other specified noninfective gastroenteritis and colitis: Secondary | ICD-10-CM

## 2015-02-06 LAB — TYPE AND SCREEN
ABO/RH(D): A POS
ANTIBODY SCREEN: NEGATIVE
UNIT DIVISION: 0
UNIT DIVISION: 0

## 2015-02-06 LAB — CBC
HCT: 27.1 % — ABNORMAL LOW (ref 39.0–52.0)
HEMATOCRIT: 28.6 % — AB (ref 39.0–52.0)
HEMOGLOBIN: 8.1 g/dL — AB (ref 13.0–17.0)
Hemoglobin: 8.5 g/dL — ABNORMAL LOW (ref 13.0–17.0)
MCH: 21.4 pg — AB (ref 26.0–34.0)
MCH: 21.5 pg — ABNORMAL LOW (ref 26.0–34.0)
MCHC: 29.9 g/dL — AB (ref 30.0–36.0)
MCHC: 29.7 g/dL — ABNORMAL LOW (ref 30.0–36.0)
MCV: 71.7 fL — ABNORMAL LOW (ref 78.0–100.0)
MCV: 72.2 fL — ABNORMAL LOW (ref 78.0–100.0)
PLATELETS: 265 10*3/uL (ref 150–400)
Platelets: 349 10*3/uL (ref 150–400)
RBC: 3.78 MIL/uL — ABNORMAL LOW (ref 4.22–5.81)
RBC: 3.96 MIL/uL — ABNORMAL LOW (ref 4.22–5.81)
RDW: 18.9 % — AB (ref 11.5–15.5)
RDW: 18.9 % — ABNORMAL HIGH (ref 11.5–15.5)
WBC: 8.1 10*3/uL (ref 4.0–10.5)
WBC: 7.8 10*3/uL (ref 4.0–10.5)

## 2015-02-06 LAB — PROTIME-INR
INR: 2.52 — AB (ref 0.00–1.49)
INR: 2.22 — ABNORMAL HIGH (ref 0.00–1.49)
PROTHROMBIN TIME: 26.9 s — AB (ref 11.6–15.2)
Prothrombin Time: 24.4 seconds — ABNORMAL HIGH (ref 11.6–15.2)

## 2015-02-06 LAB — BASIC METABOLIC PANEL
ANION GAP: 8 (ref 5–15)
BUN: 27 mg/dL — ABNORMAL HIGH (ref 6–20)
CALCIUM: 9.6 mg/dL (ref 8.9–10.3)
CO2: 27 mmol/L (ref 22–32)
CREATININE: 1.32 mg/dL — AB (ref 0.61–1.24)
Chloride: 102 mmol/L (ref 101–111)
GFR calc Af Amer: 60 mL/min — ABNORMAL LOW (ref >60)
GFR calc non Af Amer: 51 mL/min — ABNORMAL LOW (ref >60)
Glucose, Bld: 221 mg/dL — ABNORMAL HIGH (ref 65–99)
Potassium: 3.8 mmol/L (ref 3.5–5.1)
Sodium: 137 mmol/L (ref 135–145)

## 2015-02-06 LAB — GLUCOSE, CAPILLARY
GLUCOSE-CAPILLARY: 205 mg/dL — AB (ref 65–99)
GLUCOSE-CAPILLARY: 189 mg/dL — AB (ref 65–99)
Glucose-Capillary: 155 mg/dL — ABNORMAL HIGH (ref 65–99)
Glucose-Capillary: 184 mg/dL — ABNORMAL HIGH (ref 65–99)

## 2015-02-06 LAB — MAGNESIUM: MAGNESIUM: 2.1 mg/dL (ref 1.7–2.4)

## 2015-02-06 LAB — HEMOGLOBIN A1C
Hgb A1c MFr Bld: 7.2 % — ABNORMAL HIGH (ref 4.8–5.6)
MEAN PLASMA GLUCOSE: 160 mg/dL

## 2015-02-06 LAB — OCCULT BLOOD X 1 CARD TO LAB, STOOL: Fecal Occult Bld: NEGATIVE

## 2015-02-06 MED ORDER — HEPARIN (PORCINE) IN NACL 100-0.45 UNIT/ML-% IJ SOLN
1400.0000 [IU]/h | INTRAMUSCULAR | Status: DC
  Administered 2015-02-06: 1150 [IU]/h via INTRAVENOUS
  Administered 2015-02-07 – 2015-02-08 (×2): 1400 [IU]/h via INTRAVENOUS
  Filled 2015-02-06 (×3): qty 250

## 2015-02-06 MED ORDER — METRONIDAZOLE 500 MG PO TABS
500.0000 mg | ORAL_TABLET | Freq: Three times a day (TID) | ORAL | Status: DC
  Administered 2015-02-06 – 2015-02-09 (×10): 500 mg via ORAL
  Filled 2015-02-06 (×10): qty 1

## 2015-02-06 NOTE — Telephone Encounter (Signed)
Done in error.

## 2015-02-06 NOTE — Progress Notes (Addendum)
[facility] PROGRESS NOTE  Tanner Pena NIO:[id number] DOB: 1941/07/14 DOA: 02/04/2015 PCP: [name], MD  Brief narrative 74 year old male with history of CAD s/p CABG, chronic systolic and diastolic heart failure, s/p AVR withSt. 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 Patient was found to be in acute respiratory distress and placed on BiPAP. Chest x-ray showed pulmonary edema with significant leukocytosis and hemoglobin of 7.3.  Assessment/Plan Acute hypoxic respiratory failure Secondary to CHF exacerbation. Patient maintaining O2 sat on 2 L nasal cannula. Continue IV diuresis.   Acute on chronic combined systolic and diastolic CHF Last echo with EF 40%. Symptoms also worsened with underlying anemia.  Continue  IV Lasix. Good diuresis with net negative of 5.3 L. Monitor strict I/O and daily weight.  Chest pain with elevated troponin Possibly demand ischemia. Required nitroglycerin drip upon admission. Remains chest pain-free. Troponin peaked at 1.59.   Continue metoprolol, statin Imdur and when necessary nitrates.  -plavix  held. Myoview in 12/14 without ischemia. PCI of SVG to right coronary artery in 9/15  Iron deficiency anemia Baseline hemoglobin 5 months back of 11-12 which has progressively dropped to 7 this admission. Iron panel suggest iron deficiency. Hemoccults negative. Still concerning fro GI bleed with abdominal pain. Placed on IV PPI twice a day. -1U prbc on 6/19, hb stable a t 8 . Held both plavix and coumadin. [facility] following.  Ok from cardiac standpoint for EGD/ colonoscopy  ? Cecal colitis Abdominal CT done given persistent abdominal discomfort and showed a possible  inflammation of the cecum. Will place on Augmentin an dFlagyl. ( avoiding quinolone as it mayelevate INR)  Diabetes mellitus type 2 . Monitor on sliding scale insulin  History of aortic valve replacement  On Coumadin with therapeutic INR. Held for possible GI bleed. Start IV heparin once INR <2.5  Peripheral vascular disease prior SFA PTA with chronic LE disease. Holding plavix.  PVCs  monitor k and mg   Diet: Heart healthy/diabetic  DVT prophylaxis: INR therapeutic   Code Status: DNR Family Communication: none at bedside today  Disposition Plan: currently inpt   Consultants:  Cardiology  [facility]  Procedures:  None  Antibiotics:  None  HPI/Subjective: -No further chest pain symptoms. Still has of non-abdominal pain. Abdominal CT with possible cecal colitis. Hemoglobin stable after 1 unit PRBC.  Objective: Filed Vitals:   02/06/15 0447  BP: 141/43  Pulse: 56  Temp: 98 F (36.7 C)  Resp: 22    Intake/Output Summary (Last 24 hours) at 02/06/15 1117 Last data filed at 02/06/15 1000  Gross per 24 hour  Intake    780 ml  Output   4475 ml  Net  -3695 ml   Filed Weights   02/04/15 0416 02/05/15 0433 02/06/15 0447  Weight: 90.719 kg (200 lb) 91.4 kg (201 lb 8 oz) 91.788 kg (202 lb 5.7 oz)    Exam:   General:  Elderly male in no acute distress    HEENT: Pallor present, moist mucosa, supple neck, no JVD  Chest: Clear to auscultation bilaterally  CVS: Normal S1 and S2, systolic click, no murmurs  GI: Soft, nondistended, nontender, bowel sounds present  Musculoskeletal: Left BKA, trace edema.  CNS: Alert and oriented  Data Reviewed: Basic Metabolic  Panel:  Recent Labs Lab 02/04/15 0415 02/05/15 1134 02/06/15 1025  NA 138 137 137  K 4.5 3.9 3.8  CL 106 103 102  CO2 20* 25 27  GLUCOSE 310* 165* 221*  BUN 25* 28* 27*  CREATININE 1.38* 1.43* 1.32*  CALCIUM 9.3 9.4 9.6  MG  --   --  2.1   Liver Function Tests: No results for input(s):  AST, ALT, ALKPHOS, BILITOT, PROT, ALBUMIN in the last 168 hours. No results for input(s): LIPASE, AMYLASE in the last 168 hours. No results for input(s): AMMONIA in the last 168 hours. CBC:  Recent Labs Lab 02/04/15 0415 02/05/15 0503 02/05/15 1134 02/05/15 1812 02/06/15 0735  WBC 17.2* 10.2 9.7 8.7 8.1  NEUTROABS 10.6*  --   --   --   --   HGB 7.3* 7.1* 8.2* 8.1* 8.1*  HCT 25.7* 24.6* 28.3* 27.3* 27.1*  MCV 72.0* 70.9* 73.3* 72.8* 71.7*  PLT 460* 327 318 305 265   Cardiac Enzymes:  Recent Labs Lab 02/04/15 0413 02/04/15 1033 02/04/15 1518 02/05/15 1430  TROPONINI 0.05* 0.68* 1.50* 1.09*   BNP (last 3 results)  Recent Labs  12/28/14 0134 02/04/15 0413  BNP 504.3* 593.2*    ProBNP (last 3 results)  Recent Labs  05/10/14 0229 05/11/14 1530  PROBNP 820.3* 1302.0*    CBG:  Recent Labs Lab 02/05/15 0756 02/05/15 1202 02/05/15 1700 02/05/15 2055 02/06/15 0739  GLUCAP 171* 158* 157* 217* 155*    Recent Results (from the past 240 hour(s))  MRSA PCR Screening     Status: None   Collection Time: 02/04/15 11:26 AM  Result Value Ref Range Status   MRSA by PCR NEGATIVE NEGATIVE Final    Comment:        The GeneXpert MRSA Assay (FDA approved for NASAL specimens only), is one component of a comprehensive MRSA colonization surveillance program. It is not intended to diagnose MRSA infection nor to guide or monitor treatment for MRSA infections.      Studies: Ct Abdomen Pelvis Wo Contrast  02/05/2015   CLINICAL DATA:  Patient with epigastric and generalized abdominal pain for 2 weeks.  EXAM: CT ABDOMEN AND PELVIS WITHOUT CONTRAST  TECHNIQUE: Multidetector CT imaging of the abdomen and pelvis was performed following the standard protocol without IV contrast.  COMPARISON:  CT abdomen pelvis 04/22/2012  FINDINGS: Lower chest: Re- demonstrated pleural-based calcifications within the left lower hemi thorax. Normal heart size. Unchanged probable scarring left  lower lobe.  Hepatobiliary: Liver is normal in size and contour without focal hepatic lesion identified. Gallbladder is unremarkable. No intrahepatic or extrahepatic biliary ductal dilatation.  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 Right adrenal gland is unremarkable. Re- demonstrated focal atrophy of the inferior pole of the right kidney. 3 mm nonobstructing stone superior pole right kidney. Stable bilateral perinephric fat stranding. Urinary bladder is unremarkable. No ureterolithiasis. No hydronephrosis.  Stomach/Bowel: Small fat containing periumbilical hernia. There is mild wall thickening of the cecum. No significant surrounding fat stranding. Normal appendix. No evidence for bowel obstruction. Descending and sigmoid colonic diverticulosis without evidence for acute diverticulitis. No free fluid or free intraperitoneal air.  Vascular/Lymphatic: Extensive calcification of the abdominal aorta. No retroperitoneal lymphadenopathy.  Other: Fat containing periumbilical hernia.  Musculoskeletal: Lower lumbar spine degenerative changes. No aggressive or acute appearing osseous lesions.  IMPRESSION: Wall thickening of the cecum without significant surrounding inflammatory stranding. Findings may represent combination of underdistention and or possible focal  colitis. Recommend correlation with colonoscopy in the nonacute setting to exclude the possibility of underlying mass.   Electronically Signed   By: Lovey Newcomer M.D.   On: 02/05/2015 22:04    Scheduled Meds: . sodium chloride   Intravenous Once  . allopurinol  300 mg Oral QHS  . fenofibrate  160 mg Oral Daily  . furosemide  40 mg Intravenous Q12H  . insulin aspart  0-9 Units Subcutaneous TID WC  . isosorbide mononitrate  30 mg Oral Daily  . metoprolol succinate  50 mg Oral Daily  . metroNIDAZOLE  500 mg Oral 3 times per day  . pantoprazole (PROTONIX) IV  40 mg Intravenous Q12H  .  potassium chloride SA  20 mEq Oral BID  . pregabalin  50 mg Oral BID  . rosuvastatin  20 mg Oral q1800  . sucralfate  1 g Oral TID WC & HS   Continuous Infusions:     Time spent: 25 minutes    Zoltan Genest, [place]  [facility] Pager [phone]. If 7PM-7AM, please contact night-coverage at [email or web address], password [facility] 02/06/2015, 11:17 AM  LOS: 2 days

## 2015-02-06 NOTE — Progress Notes (Signed)
Patient: Tanner Pena / Admit Date: 02/04/2015 / Date of Encounter: 02/06/2015, 9:10 AM   Subjective: Breathing improved but not yet back to baseline. Continues to have vague dull abd pain.  Objective: Telemetry: NSR with frequent PVCs, brief runs of NSVT up to 4 beats; occasional SB HR upper 40s-60s Physical Exam: Blood pressure 141/43, pulse 56, temperature 98 F (36.7 C), temperature source Oral, resp. rate 22, height '5\' 8"'$  (1.727 m), weight 202 lb 5.7 oz (91.788 kg), SpO2 97 %. General: Well developed, well nourished WM, in no acute distress. Head: Normocephalic, atraumatic, sclera non-icteric, no xanthomas, nares are without discharge. Neck: JVP not elevated. Lungs: Coarse BS bilaterally without wheezes, rales, or rhonchi. Breathing is unlabored. Heart: RRR S1 S2 occasional ectopy without murmurs, rubs, or gallops.  Abdomen: Soft, non-tender, non-distended with normoactive bowel sounds. No rebound/guarding. Extremities: No clubbing or cyanosis. No edema. S/p L BKA. Neuro: Alert and oriented X 3. Moves all extremities spontaneously. Psych:  Responds to questions appropriately with a normal affect.   Intake/Output Summary (Last 24 hours) at 02/06/15 0910 Last data filed at 02/06/15 0447  Gross per 24 hour  Intake    540 ml  Output   3475 ml  Net  -2935 ml    Inpatient Medications:  . sodium chloride   Intravenous Once  . allopurinol  300 mg Oral QHS  . fenofibrate  160 mg Oral Daily  . furosemide  40 mg Intravenous Q12H  . insulin aspart  0-9 Units Subcutaneous TID WC  . isosorbide mononitrate  30 mg Oral Daily  . metoprolol succinate  50 mg Oral Daily  . pantoprazole (PROTONIX) IV  40 mg Intravenous Q12H  . potassium chloride SA  20 mEq Oral BID  . pregabalin  50 mg Oral BID  . rosuvastatin  20 mg Oral q1800  . sucralfate  1 g Oral TID WC & HS   Infusions:    Labs:  Recent Labs  02/04/15 0415 02/05/15 1134  NA 138 137  K 4.5 3.9  CL 106 103  CO2 20* 25    GLUCOSE 310* 165*  BUN 25* 28*  CREATININE 1.38* 1.43*  CALCIUM 9.3 9.4   No results for input(s): AST, ALT, ALKPHOS, BILITOT, PROT, ALBUMIN in the last 72 hours.  Recent Labs  02/04/15 0415  02/05/15 1812 02/06/15 0735  WBC 17.2*  < > 8.7 8.1  NEUTROABS 10.6*  --   --   --   HGB 7.3*  < > 8.1* 8.1*  HCT 25.7*  < > 27.3* 27.1*  MCV 72.0*  < > 72.8* 71.7*  PLT 460*  < > 305 PENDING  < > = values in this interval not displayed.  Recent Labs  02/04/15 0413 02/04/15 1033 02/04/15 1518 02/05/15 1430  TROPONINI 0.05* 0.68* 1.50* 1.09*   Invalid input(s): POCBNP No results for input(s): HGBA1C in the last 72 hours.   Radiology/Studies:  Ct Abdomen Pelvis Wo Contrast  02/05/2015   CLINICAL DATA:  Patient with epigastric and generalized abdominal pain for 2 weeks.  EXAM: CT ABDOMEN AND PELVIS WITHOUT CONTRAST  TECHNIQUE: Multidetector CT imaging of the abdomen and pelvis was performed following the standard protocol without IV contrast.  COMPARISON:  CT abdomen pelvis 04/22/2012  FINDINGS: Lower chest: Re- demonstrated pleural-based calcifications within the left lower hemi thorax. Normal heart size. Unchanged probable scarring left lower lobe.  Hepatobiliary: Liver is normal in size and contour without focal hepatic lesion identified. Gallbladder is unremarkable. No intrahepatic  or extrahepatic biliary ductal dilatation.  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 Right adrenal gland is unremarkable. Re- demonstrated focal atrophy of the inferior pole of the right kidney. 3 mm nonobstructing stone superior pole right kidney. Stable bilateral perinephric fat stranding. Urinary bladder is unremarkable. No ureterolithiasis. No hydronephrosis.  Stomach/Bowel: Small fat containing periumbilical hernia. There is mild wall thickening of the cecum. No significant surrounding fat stranding. Normal appendix. No evidence for bowel  obstruction. Descending and sigmoid colonic diverticulosis without evidence for acute diverticulitis. No free fluid or free intraperitoneal air.  Vascular/Lymphatic: Extensive calcification of the abdominal aorta. No retroperitoneal lymphadenopathy.  Other: Fat containing periumbilical hernia.  Musculoskeletal: Lower lumbar spine degenerative changes. No aggressive or acute appearing osseous lesions.  IMPRESSION: Wall thickening of the cecum without significant surrounding inflammatory stranding. Findings may represent combination of underdistention and or possible focal colitis. Recommend correlation with colonoscopy in the nonacute setting to exclude the possibility of underlying mass.   Electronically Signed   By: Lovey Newcomer M.D.   On: 02/05/2015 22:04   Dg Chest Portable 1 View  02/04/2015   CLINICAL DATA:  Shortness of breath.  EXAM: PORTABLE CHEST - 1 VIEW  COMPARISON:  12/28/2014  FINDINGS: Postoperative changes in the mediastinum. Cardiac enlargement with pulmonary vascular congestion and perihilar infiltrates suggesting edema. Linear atelectasis in the left mid lung. No definite pleural effusion although the entire costophrenic angles are not included within the field of view. No pneumothorax. Old resection or resorption of the distal right clavicle.  IMPRESSION: Cardiac enlargement with pulmonary vascular congestion and perihilar edema.   Electronically Signed   By: Lucienne Capers M.D.   On: 02/04/2015 04:33     Assessment and Plan  89M with CAD s/p 6v CABG in 1990 with mechanical St. Jude AVR, redo CABG in 1994 to the left coronary system, PCI with 3 overlapping DES to the SVG-RCA in 04/2014, PAD s/p stents to bilateral SFAs and L BKA (06/2014), HTN, HLD, DM and OSA presented 02/04/2015 with acute respiratory failure, a/c combined CHF, elevated WBC, chest pain, anemia.  1. Chest pain in the setting of anemia with mildly elevated troponin felt due to demand ischemia 2. Acute on chronic combined  CHF 3. Microcytic anemia (Hgb 12-15 range in 2014, falling as low as 7.1 this admission) 4. CAD s/p CABG with redo 1994, DESx3 in 04/2014 5. AKI 6. Frequent PVCs 7. Essential HTN  Discussed patient with Dr. Marlou Porch. At this time he is felt to be at moderate risk for proceeding with GI workup given his CAD and PVD but we do feel definitive evaluation is indicated given the underlying comorbidities which require him to be on anticoagulation. Plavix is on hold - he had PCI in 06/2014 but it has been felt that risk of progressive bleeding/anemia at this time outweighs benefit. He has completed >6 months of therapy and is not having any active angina. Coumadin on hold with heparin ordered to begin when INR<2.5. Per prior recs would consider transfusion to keep Hgb 9+.   He continues to diurese briskly. Would continue IV Lasix through today but await repeat BMET to help guide whether we need to hold his PM dose. K and Mg being checked for frequent PVCs although these appear chronic based on prior EKGs in Epic. He is unaware of these. Cannot titrate BB further due to HR at this time.  Signed, Melina Copa PA-C Pager: [phone]   Personally seen  and examined. Agree with above. Still with "stomach pain", no melena Hg noted GI consult, Dr. Penelope Coop Trop trended down - likely demand ischemia Can proceed with endoscopy when comfortable from GI perspective, moderate risk in general from cardiac perspective given multiple CAD/PVD issues. Holding coumadin and plavix (mechanical AV). Can be off of anticoagulation for up to 7 days if needed.   Candee Furbish, MD

## 2015-02-06 NOTE — Progress Notes (Signed)
Seen earlier by Dr. Marlou Porch

## 2015-02-06 NOTE — Progress Notes (Signed)
Patient ID: Tanner Pena, male   DOB: Jul 24, 1941, 74 y.o.   MRN: [id number] [facility] Gastroenterology Progress Note  Tanner Pena 74 y.o. [birth year]   Subjective: Reports mild abdominal pain earlier this morning but otherwise feels ok. Sitting on bedside of bed.   Objective: Vital signs in last 24 hours: Filed Vitals:   02/06/15 0447  BP: 141/43  Pulse: 56  Temp: 98 F (36.7 C)  Resp: 22    Physical Exam: Gen: elderly, frail, no acute distress CV: RRR Chest: CTA B Abd: soft, minimal periumbilical tenderness without guarding, otherwise nontender, nondistended, +BS  Lab Results:  Recent Labs  02/05/15 1134 02/06/15 1025  NA 137 137  K 3.9 3.8  CL 103 102  CO2 25 27  GLUCOSE 165* 221*  BUN 28* 27*  CREATININE 1.43* 1.32*  CALCIUM 9.4 9.6  MG  --  2.1   No results for input(s): AST, ALT, ALKPHOS, BILITOT, PROT, ALBUMIN in the last 72 hours.  Recent Labs  02/04/15 0415  02/05/15 1812 02/06/15 0735  WBC 17.2*  < > 8.7 8.1  NEUTROABS 10.6*  --   --   --   HGB 7.3*  < > 8.1* 8.1*  HCT 25.7*  < > 27.3* 27.1*  MCV 72.0*  < > 72.8* 71.7*  PLT 460*  < > 305 265  < > = values in this interval not displayed.  Recent Labs  02/05/15 1134 02/06/15 0735  LABPROT 27.4* 26.9*  INR 2.59* 2.52*      Assessment/Plan: Iron deficiency anemia in the setting of anticoagulation. EGD on 02/08/15 to further evaluate for peptic ulcer disease. Await INR to be less than 2 in case biopsies needed on EGD. Will change to clear liquid diet tomorrow afternoon in anticipation of EGD morning of 02/08/15. Will follow.   Hagerman C. 02/06/2015, 11:23 AM  Pager [phone]  If no answer or after 5 PM call [phone]

## 2015-02-06 NOTE — Clinical Documentation Improvement (Signed)
02/04/15 cards consult note..."5. Acute renal insufficiency: likely related to HF"..."-Renal impairment noted."... 02/05/15  BUN(mg/dL)  28H; CREAT 1.43H  02/04/15: BUN(mg/dL)  25H; CREAT 1.38H 02/05/15 MD pror note.Marland KitchenMarland Kitchen"Continue IV Lasix. Monitor strict I/O and daily weight.".Marland KitchenMarland Kitchen  For accurate Dx specificity & severity can noted "Acute renal insufficiency" be further specified with cond being mon'd, eval'd & tx. Thank you  Possible Clinical Conditions?   Acute Renal Failure/Acute Kidney Injury Acute Tubular Necrosis Acute Renal Cortical Necrosis Acute Renal Medullary Necrosis Acute on Chronic Renal Failure Chronic Renal Failure Other Condition Cannot Clinically Determine   Supporting Information: See above note  Thank You, Ermelinda Das, RN, BSN, CCDS Certified Clinical Documentation Specialist Pager: [place]: Health Information Management

## 2015-02-06 NOTE — Progress Notes (Signed)
ANTICOAGULATION CONSULT NOTE - Initial Consult  Pharmacy Consult for heparin Indication: mechanical aortic valve  No Known Allergies  Patient Measurements: Height: '5\' 8"'$  (172.7 cm) Weight: 202 lb 5.7 oz (91.788 kg) IBW/kg (Calculated) : 68.4 Heparin Dosing Weight: 87kg  Vital Signs: Temp: 98.7 F (37.1 C) (06/20 2038) Temp Source: Oral (06/20 2038) BP: 134/33 mmHg (06/20 2038) Pulse Rate: 60 (06/20 2038)  Labs:  Recent Labs  02/04/15 0415 02/04/15 1033 02/04/15 1518  02/05/15 1134 02/05/15 1430 02/05/15 1812 02/06/15 0735 02/06/15 1025 02/06/15 1822 02/06/15 2109  HGB 7.3*  --   --   < > 8.2*  --  8.1* 8.1*  --  8.5*  --   HCT 25.7*  --   --   < > 28.3*  --  27.3* 27.1*  --  28.6*  --   PLT 460*  --   --   < > 318  --  305 265  --  349  --   APTT 36  --   --   --   --   --   --   --   --   --   --   LABPROT 27.6*  --   --   --  27.4*  --   --  26.9*  --   --  24.4*  INR 2.62*  --   --   --  2.59*  --   --  2.52*  --   --  2.22*  CREATININE 1.38*  --   --   --  1.43*  --   --   --  1.32*  --   --   TROPONINI  --  0.68* 1.50*  --   --  1.09*  --   --   --   --   --   < > = values in this interval not displayed.  Estimated Creatinine Clearance: 54 mL/min (by C-G formula based on Cr of 1.32).   Medical History: Past Medical History  Diagnosis Date  . Diabetes mellitus 2007  . Hypertension   . Gout   . Hypercholesteremia   . Peripheral vascular disease     stents lower extremity  . COPD (chronic obstructive pulmonary disease)   . S/P aortic valve replacement 1990    a. St. Jude  . Chronic back pain   . Dysphagia   . Neuromuscular disorder   . Peripheral neuropathy   . Critical lower limb ischemia   . CAD (coronary artery disease)     a. 05/13/14 [name] s/p overlapping DESx2 to SVG to RCA. b.  s/p CABG '90 with redo '94 & stent to RCA SVG in 2005  . Chronic toe ulcer     a. left foot  . Shortness of breath   . Myocardial infarction   . CHF (congestive heart  failure)   . Stone in kidney   . GERD (gastroesophageal reflux disease)   . Arthritis   . Sleep apnea     tested greater than 7 years ago per patient    Assessment: 25 YOM with a mechanical aortic valve on warfarin (goal INR 2.5-3.5 per [facility] notes) who has been having mid abdominal pain for ~3 weeks with unclear etiology along with iron deficiency anemia. GI is following and planning an EGD to evaluate for PUD. Awaiting INR to be <2 in case biopsies need to be taken.  Pharmacy consulted for heparin dosing to start when INR <2.5. Patient's home dose of  warfarin is '5mg'$  daily except 2.'5mg'$  on Mondays and Fridays. Last dose taken 6/18.  INR this evening 2.22.  Hgb 8.5, plts 349.  Goal of Therapy:  Heparin level 0.3-0.7 units/ml Monitor platelets by anticoagulation protocol: Yes   Plan:  -start heparin WITHOUT bolus at 1150 units/hr -heparin level in 8 hours with AM labs -daily HL and CBC -f/u ability to restart warfarin  Antavious Spanos D. Khy Pitre, PharmD, BCPS Clinical Pharmacist Pager: [phone] 02/06/2015 9:51 PM

## 2015-02-07 DIAGNOSIS — R109 Unspecified abdominal pain: Secondary | ICD-10-CM | POA: Insufficient documentation

## 2015-02-07 DIAGNOSIS — R1031 Right lower quadrant pain: Secondary | ICD-10-CM

## 2015-02-07 DIAGNOSIS — I5023 Acute on chronic systolic (congestive) heart failure: Secondary | ICD-10-CM

## 2015-02-07 LAB — BASIC METABOLIC PANEL
Anion gap: 11 (ref 5–15)
BUN: 36 mg/dL — AB (ref 6–20)
CALCIUM: 9.5 mg/dL (ref 8.9–10.3)
CO2: 26 mmol/L (ref 22–32)
Chloride: 100 mmol/L — ABNORMAL LOW (ref 101–111)
Creatinine, Ser: 1.31 mg/dL — ABNORMAL HIGH (ref 0.61–1.24)
GFR calc Af Amer: >60 mL/min (ref >60)
GFR, EST NON AFRICAN AMERICAN: 52 mL/min — AB (ref >60)
GLUCOSE: 292 mg/dL — AB (ref 65–99)
Potassium: 3.7 mmol/L (ref 3.5–5.1)
Sodium: 137 mmol/L (ref 135–145)

## 2015-02-07 LAB — CBC
HCT: 28.7 % — ABNORMAL LOW (ref 39.0–52.0)
HEMOGLOBIN: 8.5 g/dL — AB (ref 13.0–17.0)
MCH: 21.4 pg — ABNORMAL LOW (ref 26.0–34.0)
MCHC: 29.6 g/dL — ABNORMAL LOW (ref 30.0–36.0)
MCV: 72.1 fL — ABNORMAL LOW (ref 78.0–100.0)
PLATELETS: 303 10*3/uL (ref 150–400)
RBC: 3.98 MIL/uL — ABNORMAL LOW (ref 4.22–5.81)
RDW: 19.2 % — ABNORMAL HIGH (ref 11.5–15.5)
WBC: 8.4 10*3/uL (ref 4.0–10.5)

## 2015-02-07 LAB — GLUCOSE, CAPILLARY
GLUCOSE-CAPILLARY: 197 mg/dL — AB (ref 65–99)
GLUCOSE-CAPILLARY: 193 mg/dL — AB (ref 65–99)
Glucose-Capillary: 203 mg/dL — ABNORMAL HIGH (ref 65–99)
Glucose-Capillary: 219 mg/dL — ABNORMAL HIGH (ref 65–99)

## 2015-02-07 LAB — HEPARIN LEVEL (UNFRACTIONATED)
HEPARIN UNFRACTIONATED: 0.13 [IU]/mL — AB (ref 0.30–0.70)
Heparin Unfractionated: 0.37 IU/mL (ref 0.30–0.70)

## 2015-02-07 LAB — PROTIME-INR
INR: 2.21 — ABNORMAL HIGH (ref 0.00–1.49)
PROTHROMBIN TIME: 24.3 s — AB (ref 11.6–15.2)

## 2015-02-07 MED ORDER — INSULIN GLARGINE 100 UNIT/ML ~~LOC~~ SOLN
12.0000 [IU] | Freq: Every day | SUBCUTANEOUS | Status: DC
  Administered 2015-02-07 – 2015-02-09 (×3): 12 [IU] via SUBCUTANEOUS
  Filled 2015-02-07 (×4): qty 0.12

## 2015-02-07 MED ORDER — INSULIN ASPART 100 UNIT/ML ~~LOC~~ SOLN
0.0000 [IU] | Freq: Every day | SUBCUTANEOUS | Status: DC
  Administered 2015-02-09: 3 [IU] via SUBCUTANEOUS
  Administered 2015-02-10: 2 [IU] via SUBCUTANEOUS

## 2015-02-07 MED ORDER — FUROSEMIDE 80 MG PO TABS
80.0000 mg | ORAL_TABLET | Freq: Every day | ORAL | Status: DC
  Administered 2015-02-08 – 2015-02-12 (×5): 80 mg via ORAL
  Filled 2015-02-07 (×5): qty 1

## 2015-02-07 MED ORDER — INSULIN ASPART 100 UNIT/ML ~~LOC~~ SOLN
0.0000 [IU] | Freq: Three times a day (TID) | SUBCUTANEOUS | Status: DC
  Administered 2015-02-07 (×2): 5 [IU] via SUBCUTANEOUS
  Administered 2015-02-07: 3 [IU] via SUBCUTANEOUS
  Administered 2015-02-08: 5 [IU] via SUBCUTANEOUS
  Administered 2015-02-08: 2 [IU] via SUBCUTANEOUS
  Administered 2015-02-08 – 2015-02-09 (×3): 5 [IU] via SUBCUTANEOUS
  Administered 2015-02-10: 8 [IU] via SUBCUTANEOUS
  Administered 2015-02-10: 5 [IU] via SUBCUTANEOUS
  Administered 2015-02-10 – 2015-02-11 (×2): 3 [IU] via SUBCUTANEOUS
  Administered 2015-02-11: 2 [IU] via SUBCUTANEOUS
  Administered 2015-02-11: 8 [IU] via SUBCUTANEOUS
  Administered 2015-02-12: 3 [IU] via SUBCUTANEOUS

## 2015-02-07 NOTE — Progress Notes (Signed)
[facility] PROGRESS NOTE  BAKARY BRAMER PTW:[id number] DOB: [birth year] DOA: 02/04/2015 PCP: [name], MD  Brief narrative 74 year old male with history of CAD s/p CABG, chronic systolic and diastolic heart failure, s/p AVR withSt. 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 Patient was found to be in acute respiratory distress and placed on BiPAP. Chest x-ray showed pulmonary edema with significant leukocytosis and hemoglobin of 7.3.  Assessment/Plan Acute hypoxic respiratory failure Secondary to CHF exacerbation. Diabetes well with IV Lasix. Negative balance of 6.4 L since admission.   Acute on chronic combined systolic and diastolic CHF Last echo with EF 40%. Symptoms also worsened with underlying anemia.  Diabetes mellitus with IV Lasix Good diuresis with net negative of 6.4 L. transition to home dose by mouth Lasix. Monitor strict I/O and daily weight. Continue beta blocker, Imdur and statin.  Chest pain with elevated troponin Possibly demand ischemia. Required nitroglycerin drip upon admission. Remains chest pain-free. Troponin peaked at 1.59.   Continue metoprolol, statin Imdur and when necessary nitrates.  -plavix  held. Myoview in 12/14 without ischemia. PCI of SVG to right coronary artery in 9/15  Iron deficiency anemia Baseline hemoglobin 5 months back of 11-12 which has progressively dropped to 7 this admission. Iron panel suggest iron deficiency. Hemoccults negative. Still concerning fro GI bleed with abdominal pain. Placed on IV PPI twice a day. -1U prbc on 6/19, hb stable a t 8 . Held both plavix and coumadin. [facility] following with plan on EGD tomorrow if INR<2. Ok from cardiac  standpoint for GI workup.  Acute kidney injury Mild. Secondary to IV diuresis. Monitor.  ? Cecal colitis Abdominal CT done given persistent abdominal discomfort and showed a possible inflammation of the cecum. Placed on Flagyl. ( avoiding quinolone as it mayelevate INR) symptoms improved.  Diabetes mellitus type 2,  FSG elevated. A1c of 7.2. Will add Lantus 12 units daily at bedtime and sliding scale insulin.  History of aortic valve replacement  Coumadin held for possible GI bleed. Started on IV heparin.   Peripheral vascular disease prior SFA PTA with chronic LE disease. Holding plavix.  PVCs  monitor k and mg   Diet: Heart healthy/diabetic. Nothing by mouth after midnight  DVT prophylaxis: INR therapeutic. On IV heparin  Code Status: DNR Family Communication: none at bedside today  Disposition Plan: currently inpt. Home once GI workup completed. Possibly on 6/23   Consultants:  Cardiology  [facility]  Procedures:  For EGD on 6/22  Antibiotics:   Flagyl since 6/20  HPI/Subjective: Abdominal discomfort has improved. Denies further chest pain. Shortness of breath resolved.  Objective: Filed Vitals:   02/07/15 0504  BP: 125/37  Pulse: 53  Temp: 97.9 F (36.6 C)  Resp: 20    Intake/Output Summary (Last 24 hours) at 02/07/15 1137 Last data filed at 02/07/15 1050  Gross per 24 hour  Intake  576.8 ml  Output   1650 ml  Net -1073.2 ml   Filed Weights   02/05/15 0433 02/06/15 0447 02/07/15 0504  Weight: 91.4 kg (201 lb 8 oz) 91.788 kg (202 lb 5.7 oz) 88.043 kg (194 lb 1.6 oz)    Exam:   General:  Elderly male in no acute distress    HEENT: Pallor present,  moist mucosa, supple neck,  Chest: Clear to auscultation bilaterally  CVS: Normal S1 and S2, systolic click, no murmurs  GI: Soft, nondistended, nontender, bowel sounds present  Musculoskeletal: Left BKA, edema resolved  CNS: Alert and oriented  Data Reviewed: Basic Metabolic  Panel:  Recent Labs Lab 02/04/15 0415 02/05/15 1134 02/06/15 1025 02/07/15 0536  NA 138 137 137 137  K 4.5 3.9 3.8 3.7  CL 106 103 102 100*  CO2 20* '25 27 26  '$ GLUCOSE 310* 165* 221* 292*  BUN 25* 28* 27* 36*  CREATININE 1.38* 1.43* 1.32* 1.31*  CALCIUM 9.3 9.4 9.6 9.5  MG  --   --  2.1  --    Liver Function Tests: No results for input(s): AST, ALT, ALKPHOS, BILITOT, PROT, ALBUMIN in the last 168 hours. No results for input(s): LIPASE, AMYLASE in the last 168 hours. No results for input(s): AMMONIA in the last 168 hours. CBC:  Recent Labs Lab 02/04/15 0415  02/05/15 1134 02/05/15 1812 02/06/15 0735 02/06/15 1822 02/07/15 0536  WBC 17.2*  < > 9.7 8.7 8.1 7.8 8.4  NEUTROABS 10.6*  --   --   --   --   --   --   HGB 7.3*  < > 8.2* 8.1* 8.1* 8.5* 8.5*  HCT 25.7*  < > 28.3* 27.3* 27.1* 28.6* 28.7*  MCV 72.0*  < > 73.3* 72.8* 71.7* 72.2* 72.1*  PLT 460*  < > 318 305 265 349 303  < > = values in this interval not displayed. Cardiac Enzymes:  Recent Labs Lab 02/04/15 0413 02/04/15 1033 02/04/15 1518 02/05/15 1430  TROPONINI 0.05* 0.68* 1.50* 1.09*   BNP (last 3 results)  Recent Labs  12/28/14 0134 02/04/15 0413  BNP 504.3* 593.2*    ProBNP (last 3 results)  Recent Labs  05/10/14 0229 05/11/14 1530  PROBNP 820.3* 1302.0*    CBG:  Recent Labs Lab 02/06/15 1136 02/06/15 1617 02/06/15 2045 02/07/15 0719 02/07/15 1126  GLUCAP 184* 205* 189* 203* 219*    Recent Results (from the past 240 hour(s))  MRSA PCR Screening     Status: None   Collection Time: 02/04/15 11:26 AM  Result Value Ref Range Status   MRSA by PCR NEGATIVE NEGATIVE Final    Comment:        The GeneXpert MRSA Assay (FDA approved for NASAL specimens only), is one component of a comprehensive MRSA colonization surveillance program. It is not intended to diagnose MRSA infection nor to guide or monitor treatment for MRSA infections.      Studies: Ct Abdomen Pelvis Wo  Contrast  02/05/2015   CLINICAL DATA:  Patient with epigastric and generalized abdominal pain for 2 weeks.  EXAM: CT ABDOMEN AND PELVIS WITHOUT CONTRAST  TECHNIQUE: Multidetector CT imaging of the abdomen and pelvis was performed following the standard protocol without IV contrast.  COMPARISON:  CT abdomen pelvis 04/22/2012  FINDINGS: Lower chest: Re- demonstrated pleural-based calcifications within the left lower hemi thorax. Normal heart size. Unchanged probable scarring left lower lobe.  Hepatobiliary: Liver is normal in size and contour without focal hepatic lesion identified. Gallbladder is unremarkable. No intrahepatic or extrahepatic biliary ductal dilatation.  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 Right adrenal gland is unremarkable. Re- demonstrated focal atrophy of the inferior pole of the right kidney. 3 mm nonobstructing stone superior pole right kidney. Stable bilateral perinephric fat stranding. Urinary bladder is unremarkable. No ureterolithiasis. No hydronephrosis.  Stomach/Bowel: Small  fat containing periumbilical hernia. There is mild wall thickening of the cecum. No significant surrounding fat stranding. Normal appendix. No evidence for bowel obstruction. Descending and sigmoid colonic diverticulosis without evidence for acute diverticulitis. No free fluid or free intraperitoneal air.  Vascular/Lymphatic: Extensive calcification of the abdominal aorta. No retroperitoneal lymphadenopathy.  Other: Fat containing periumbilical hernia.  Musculoskeletal: Lower lumbar spine degenerative changes. No aggressive or acute appearing osseous lesions.  IMPRESSION: Wall thickening of the cecum without significant surrounding inflammatory stranding. Findings may represent combination of underdistention and or possible focal colitis. Recommend correlation with colonoscopy in the nonacute setting to exclude the possibility of underlying mass.    Electronically Signed   By: Lovey Newcomer M.D.   On: 02/05/2015 22:04    Scheduled Meds: . sodium chloride   Intravenous Once  . allopurinol  300 mg Oral QHS  . fenofibrate  160 mg Oral Daily  . [START ON 02/08/2015] furosemide  80 mg Oral Daily  . insulin aspart  0-15 Units Subcutaneous TID WC  . insulin aspart  0-5 Units Subcutaneous QHS  . insulin glargine  12 Units Subcutaneous QHS  . isosorbide mononitrate  30 mg Oral Daily  . metoprolol succinate  50 mg Oral Daily  . metroNIDAZOLE  500 mg Oral 3 times per day  . pantoprazole (PROTONIX) IV  40 mg Intravenous Q12H  . potassium chloride SA  20 mEq Oral BID  . pregabalin  50 mg Oral BID  . rosuvastatin  20 mg Oral q1800  . sucralfate  1 g Oral TID WC & HS   Continuous Infusions: . heparin 1,400 Units/hr (02/07/15 0709)      Time spent: 25 minutes    Tequan Redmon, [place]  [facility] Pager [phone]. If 7PM-7AM, please contact night-coverage at [email or web address], password [facility] 02/07/2015, 11:37 AM  LOS: 3 days

## 2015-02-07 NOTE — Progress Notes (Signed)
ANTICOAGULATION CONSULT NOTE - Follow Up Consult  Pharmacy Consult for heparin Indication: AVR   Labs:  Recent Labs  02/04/15 1033 02/04/15 1518  02/05/15 1134 02/05/15 1430  02/06/15 0735 02/06/15 1025 02/06/15 1822 02/06/15 2109 02/07/15 0536  HGB  --   --   < > 8.2*  --   < > 8.1*  --  8.5*  --  8.5*  HCT  --   --   < > 28.3*  --   < > 27.1*  --  28.6*  --  28.7*  PLT  --   --   < > 318  --   < > 265  --  349  --  303  LABPROT  --   --   < > 27.4*  --   --  26.9*  --   --  24.4* 24.3*  INR  --   --   < > 2.59*  --   --  2.52*  --   --  2.22* 2.21*  HEPARINUNFRC  --   --   --   --   --   --   --   --   --   --  0.13*  CREATININE  --   --   --  1.43*  --   --   --  1.32*  --   --  1.31*  TROPONINI 0.68* 1.50*  --   --  1.09*  --   --   --   --   --   --   < > = values in this interval not displayed.    Assessment: 74yo male subtherapeutic on heparin with initial dosing while Coumadin on hold.  Goal of Therapy:  Heparin level 0.3-0.7 units/ml   Plan:  Will increase heparin gtt by 3 units/kg/hr to 1400 units/hr and check level in [place], PharmD, BCPS  02/07/2015,6:59 AM

## 2015-02-07 NOTE — Progress Notes (Signed)
       Patient Name: Tanner Pena Date of Encounter: 02/07/2015    SUBJECTIVE: Feels short of breath intermittently. No chest pain.  TELEMETRY:  Sinus rhythm  Filed Vitals:   02/06/15 0447 02/06/15 1520 02/06/15 2038 02/07/15 0504  BP: 141/43 120/66 134/33 125/37  Pulse: 56 61 60 53  Temp: 98 F (36.7 C) 98 F (36.7 C) 98.7 F (37.1 C) 97.9 F (36.6 C)  TempSrc: Oral Oral Oral Oral  Resp: '22 20 20 20  '$ Height:      Weight: 91.788 kg (202 lb 5.7 oz)   88.043 kg (194 lb 1.6 oz)  SpO2: 97% 96% 96% 98%    Intake/Output Summary (Last 24 hours) at 02/07/15 1105 Last data filed at 02/07/15 1050  Gross per 24 hour  Intake  576.8 ml  Output   1650 ml  Net -1073.2 ml   LABS: Basic Metabolic Panel:  Recent Labs  02/06/15 1025 02/07/15 0536  NA 137 137  K 3.8 3.7  CL 102 100*  CO2 27 26  GLUCOSE 221* 292*  BUN 27* 36*  CREATININE 1.32* 1.31*  CALCIUM 9.6 9.5  MG 2.1  --    CBC:  Recent Labs  02/06/15 1822 02/07/15 0536  WBC 7.8 8.4  HGB 8.5* 8.5*  HCT 28.6* 28.7*  MCV 72.2* 72.1*  PLT 349 303   Cardiac Enzymes:  Recent Labs  02/04/15 1518 02/05/15 1430  TROPONINI 1.50* 1.09*   Radiology/Studies:  No new data  Physical Exam: Blood pressure 125/37, pulse 53, temperature 97.9 F (36.6 C), temperature source Oral, resp. rate 20, height '5\' 8"'$  (1.727 m), weight 88.043 kg (194 lb 1.6 oz), SpO2 98 %. Weight change: -3.745 kg (-8 lb 4.1 oz)  Wt Readings from Last 3 Encounters:  02/07/15 88.043 kg (194 lb 1.6 oz)  01/01/15 91.536 kg (201 lb 12.8 oz)  10/13/14 91.627 kg (202 lb)    Chest is clear No JVD  ASSESSMENT:  1. A/C systolic heart failure, improved with diuresis 2. Mechanical aortic valve, now off anticoagulation 3. GI bleeding, stopped 4. Elevated troponin due to demand ischemia  Plan:  1. Revert to usual OP diuretic regimen 2,. GI work up tomorrow. 3. Resume anticoagulation when possible, hopefully within next 2-3  days.  [name] 02/07/2015, 11:05 AM

## 2015-02-07 NOTE — Progress Notes (Signed)
ANTICOAGULATION CONSULT NOTE   Pharmacy Consult for heparin Indication: mechanical aortic valve  No Known Allergies  Patient Measurements: Height: '5\' 8"'$  (172.7 cm) Weight: 194 lb 1.6 oz (88.043 kg) IBW/kg (Calculated) : 68.4 Heparin Dosing Weight: 87kg  Vital Signs: Temp: 98.3 F (36.8 C) (06/21 1505) Temp Source: Oral (06/21 1505) BP: 138/51 mmHg (06/21 1505) Pulse Rate: 59 (06/21 1505)  Labs:  Recent Labs  02/05/15 1134 02/05/15 1430  02/06/15 0735 02/06/15 1025 02/06/15 1822 02/06/15 2109 02/07/15 0536 02/07/15 1608  HGB 8.2*  --   < > 8.1*  --  8.5*  --  8.5*  --   HCT 28.3*  --   < > 27.1*  --  28.6*  --  28.7*  --   PLT 318  --   < > 265  --  349  --  303  --   LABPROT 27.4*  --   --  26.9*  --   --  24.4* 24.3*  --   INR 2.59*  --   --  2.52*  --   --  2.22* 2.21*  --   HEPARINUNFRC  --   --   --   --   --   --   --  0.13* 0.37  CREATININE 1.43*  --   --   --  1.32*  --   --  1.31*  --   TROPONINI  --  1.09*  --   --   --   --   --   --   --   < > = values in this interval not displayed.  Estimated Creatinine Clearance: 53.3 mL/min (by C-G formula based on Cr of 1.31).   Medical History: Past Medical History  Diagnosis Date  . Diabetes mellitus 2007  . Hypertension   . Gout   . Hypercholesteremia   . Peripheral vascular disease     stents lower extremity  . COPD (chronic obstructive pulmonary disease)   . S/P aortic valve replacement 1990    a. St. Jude  . Chronic back pain   . Dysphagia   . Neuromuscular disorder   . Peripheral neuropathy   . Critical lower limb ischemia   . CAD (coronary artery disease)     a. 05/13/14 [name] s/p overlapping DESx2 to SVG to RCA. b.  s/p CABG '90 with redo '94 & stent to RCA SVG in 2005  . Chronic toe ulcer     a. left foot  . Shortness of breath   . Myocardial infarction   . CHF (congestive heart failure)   . Stone in kidney   . GERD (gastroesophageal reflux disease)   . Arthritis   . Sleep apnea    tested greater than 7 years ago per patient    Assessment: 37 YOM with a mechanical aortic valve on warfarin (goal INR 2.5-3.5 per [facility] notes) who has been having mid abdominal pain for ~3 weeks with unclear etiology along with iron deficiency anemia. GI is following and planning an EGD to evaluate for PUD. Awaiting INR to be <2 in case biopsies need to be taken.  Pharmacy consulted for heparin dosing to start when INR <2.5, INR this am was 2.2 so heparin was started. Patient's home dose of warfarin is '5mg'$  daily except 2.'5mg'$  on Mondays and Fridays. Last dose taken 6/18.  HL low this am, now at goal (0.37) on 1400 units/hr. No bleeding issues noted.   Goal of Therapy:  Heparin level 0.3-0.7  units/ml Monitor platelets by anticoagulation protocol: Yes   Plan:  Continue heparin at 1400 units/hr HL/CBC in am EGD 6/22 if INR<2  Erin Hearing PharmD., BCPS Clinical Pharmacist Pager [phone] 02/07/2015 7:04 PM

## 2015-02-07 NOTE — Progress Notes (Signed)
[facility] Progress Note  Subjective: Patient does not have any abdominal pain today. No stool  Objective: Vital signs in last 24 hours: Temp:  [97.9 F (36.6 C)-98.7 F (37.1 C)] 97.9 F (36.6 C) (06/21 0504) Pulse Rate:  [53-61] 53 (06/21 0504) Resp:  [20] 20 (06/21 0504) BP: (120-134)/(33-66) 125/37 mmHg (06/21 0504) SpO2:  [96 %-98 %] 98 % (06/21 0504) Weight:  [88.043 kg (194 lb 1.6 oz)] 88.043 kg (194 lb 1.6 oz) (06/21 0504) Weight change: -3.745 kg (-8 lb 4.1 oz)   PE: Abdomen soft nondistended  Lab Results: Results for orders placed or performed during the hospital encounter of 02/04/15 (from the past 24 hour(s))  Basic metabolic panel     Status: Abnormal   Collection Time: 02/06/15 10:25 AM  Result Value Ref Range   Sodium 137 135 - 145 mmol/L   Potassium 3.8 3.5 - 5.1 mmol/L   Chloride 102 101 - 111 mmol/L   CO2 27 22 - 32 mmol/L   Glucose, Bld 221 (H) 65 - 99 mg/dL   BUN 27 (H) 6 - 20 mg/dL   Creatinine, Ser 1.32 (H) 0.61 - 1.24 mg/dL   Calcium 9.6 8.9 - 10.3 mg/dL   GFR calc non Af Amer 51 (L) >60 mL/min   GFR calc Af Amer 60 (L) >60 mL/min   Anion gap 8 5 - 15  Magnesium     Status: None   Collection Time: 02/06/15 10:25 AM  Result Value Ref Range   Magnesium 2.1 1.7 - 2.4 mg/dL  Glucose, capillary     Status: Abnormal   Collection Time: 02/06/15 11:36 AM  Result Value Ref Range   Glucose-Capillary 184 (H) 65 - 99 mg/dL   Comment 1 Notify RN    Comment 2 Document in Chart   Glucose, capillary     Status: Abnormal   Collection Time: 02/06/15  4:17 PM  Result Value Ref Range   Glucose-Capillary 205 (H) 65 - 99 mg/dL   Comment 1 Notify RN    Comment 2 Document in Chart   CBC     Status: Abnormal   Collection Time: 02/06/15  6:22 PM  Result Value Ref Range   WBC 7.8 4.0 - 10.5 K/uL   RBC 3.96 (L) 4.22 - 5.81 MIL/uL   Hemoglobin 8.5 (L) 13.0 - 17.0 g/dL   HCT 28.6 (L) 39.0 - 52.0 %   MCV 72.2 (L) 78.0 - 100.0 fL   MCH 21.5 (L) 26.0 -  34.0 pg   MCHC 29.7 (L) 30.0 - 36.0 g/dL   RDW 18.9 (H) 11.5 - 15.5 %   Platelets 349 150 - 400 K/uL  Glucose, capillary     Status: Abnormal   Collection Time: 02/06/15  8:45 PM  Result Value Ref Range   Glucose-Capillary 189 (H) 65 - 99 mg/dL  Protime-INR     Status: Abnormal   Collection Time: 02/06/15  9:09 PM  Result Value Ref Range   Prothrombin Time 24.4 (H) 11.6 - 15.2 seconds   INR 2.22 (H) 0.00 - 0.24  Basic metabolic panel     Status: Abnormal   Collection Time: 02/07/15  5:36 AM  Result Value Ref Range   Sodium 137 135 - 145 mmol/L   Potassium 3.7 3.5 - 5.1 mmol/L   Chloride 100 (L) 101 - 111 mmol/L   CO2 26 22 - 32 mmol/L   Glucose, Bld 292 (H) 65 - 99 mg/dL   BUN 36 (H) 6 -  20 mg/dL   Creatinine, Ser 1.31 (H) 0.61 - 1.24 mg/dL   Calcium 9.5 8.9 - 10.3 mg/dL   GFR calc non Af Amer 52 (L) >60 mL/min   GFR calc Af Amer >60 >60 mL/min   Anion gap 11 5 - 15  CBC     Status: Abnormal   Collection Time: 02/07/15  5:36 AM  Result Value Ref Range   WBC 8.4 4.0 - 10.5 K/uL   RBC 3.98 (L) 4.22 - 5.81 MIL/uL   Hemoglobin 8.5 (L) 13.0 - 17.0 g/dL   HCT 28.7 (L) 39.0 - 52.0 %   MCV 72.1 (L) 78.0 - 100.0 fL   MCH 21.4 (L) 26.0 - 34.0 pg   MCHC 29.6 (L) 30.0 - 36.0 g/dL   RDW 19.2 (H) 11.5 - 15.5 %   Platelets 303 150 - 400 K/uL  Heparin level (unfractionated)     Status: Abnormal   Collection Time: 02/07/15  5:36 AM  Result Value Ref Range   Heparin Unfractionated 0.13 (L) 0.30 - 0.70 IU/mL  Protime-INR     Status: Abnormal   Collection Time: 02/07/15  5:36 AM  Result Value Ref Range   Prothrombin Time 24.3 (H) 11.6 - 15.2 seconds   INR 2.21 (H) 0.00 - 1.49  Glucose, capillary     Status: Abnormal   Collection Time: 02/07/15  7:19 AM  Result Value Ref Range   Glucose-Capillary 203 (H) 65 - 99 mg/dL   Comment 1 Notify RN    Comment 2 Document in Chart     Studies/Results: Ct Abdomen Pelvis Wo Contrast  02/05/2015   CLINICAL DATA:  Patient with epigastric and  generalized abdominal pain for 2 weeks.  EXAM: CT ABDOMEN AND PELVIS WITHOUT CONTRAST  TECHNIQUE: Multidetector CT imaging of the abdomen and pelvis was performed following the standard protocol without IV contrast.  COMPARISON:  CT abdomen pelvis 04/22/2012  FINDINGS: Lower chest: Re- demonstrated pleural-based calcifications within the left lower hemi thorax. Normal heart size. Unchanged probable scarring left lower lobe.  Hepatobiliary: Liver is normal in size and contour without focal hepatic lesion identified. Gallbladder is unremarkable. No intrahepatic or extrahepatic biliary ductal dilatation.  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 Right adrenal gland is unremarkable. Re- demonstrated focal atrophy of the inferior pole of the right kidney. 3 mm nonobstructing stone superior pole right kidney. Stable bilateral perinephric fat stranding. Urinary bladder is unremarkable. No ureterolithiasis. No hydronephrosis.  Stomach/Bowel: Small fat containing periumbilical hernia. There is mild wall thickening of the cecum. No significant surrounding fat stranding. Normal appendix. No evidence for bowel obstruction. Descending and sigmoid colonic diverticulosis without evidence for acute diverticulitis. No free fluid or free intraperitoneal air.  Vascular/Lymphatic: Extensive calcification of the abdominal aorta. No retroperitoneal lymphadenopathy.  Other: Fat containing periumbilical hernia.  Musculoskeletal: Lower lumbar spine degenerative changes. No aggressive or acute appearing osseous lesions.  IMPRESSION: Wall thickening of the cecum without significant surrounding inflammatory stranding. Findings may represent combination of underdistention and or possible focal colitis. Recommend correlation with colonoscopy in the nonacute setting to exclude the possibility of underlying mass.   Electronically Signed   By: Lovey Newcomer M.D.   On: 02/05/2015  22:04      Assessment: Anemia so should with Coumadin and some dyspeptic symptoms  Plan: Continue PPI. Schedule for EGD late tomorrow morning hopefully with improved coags.    Aslin Farinas C 02/07/2015, 8:46 AM  Pager [phone] If no answer  or after 5 PM call [phone]

## 2015-02-08 ENCOUNTER — Encounter (HOSPITAL_COMMUNITY)
Admission: EM | Disposition: A | Discharge status: Home / Self Care | Payer: Self-pay | Source: Home / Self Care | Attending: Internal Medicine

## 2015-02-08 ENCOUNTER — Encounter (HOSPITAL_COMMUNITY): Payer: Self-pay | Admitting: Certified Registered"

## 2015-02-08 ENCOUNTER — Encounter (HOSPITAL_COMMUNITY): Payer: Self-pay | Admitting: *Deleted

## 2015-02-08 DIAGNOSIS — I509 Heart failure, unspecified: Secondary | ICD-10-CM

## 2015-02-08 LAB — PROTIME-INR
INR: 2.1 — AB (ref 0.00–1.49)
Prothrombin Time: 23.4 seconds — ABNORMAL HIGH (ref 11.6–15.2)

## 2015-02-08 LAB — GLUCOSE, CAPILLARY
GLUCOSE-CAPILLARY: 137 mg/dL — AB (ref 65–99)
GLUCOSE-CAPILLARY: 217 mg/dL — AB (ref 65–99)
Glucose-Capillary: 218 mg/dL — ABNORMAL HIGH (ref 65–99)
Glucose-Capillary: 194 mg/dL — ABNORMAL HIGH (ref 65–99)

## 2015-02-08 LAB — CBC
HCT: 30.2 % — ABNORMAL LOW (ref 39.0–52.0)
Hemoglobin: 8.8 g/dL — ABNORMAL LOW (ref 13.0–17.0)
MCH: 21.1 pg — AB (ref 26.0–34.0)
MCHC: 29.1 g/dL — ABNORMAL LOW (ref 30.0–36.0)
MCV: 72.4 fL — AB (ref 78.0–100.0)
Platelets: 351 10*3/uL (ref 150–400)
RBC: 4.17 MIL/uL — AB (ref 4.22–5.81)
RDW: 19.7 % — ABNORMAL HIGH (ref 11.5–15.5)
WBC: 9 10*3/uL (ref 4.0–10.5)

## 2015-02-08 LAB — BASIC METABOLIC PANEL
Anion gap: 9 (ref 5–15)
BUN: 37 mg/dL — AB (ref 6–20)
CALCIUM: 9.3 mg/dL (ref 8.9–10.3)
CHLORIDE: 103 mmol/L (ref 101–111)
CO2: 26 mmol/L (ref 22–32)
CREATININE: 1.31 mg/dL — AB (ref 0.61–1.24)
GFR, EST NON AFRICAN AMERICAN: 52 mL/min — AB (ref >60)
Glucose, Bld: 168 mg/dL — ABNORMAL HIGH (ref 65–99)
Potassium: 4.1 mmol/L (ref 3.5–5.1)
SODIUM: 138 mmol/L (ref 135–145)

## 2015-02-08 LAB — HEPARIN LEVEL (UNFRACTIONATED): Heparin Unfractionated: 0.67 IU/mL (ref 0.30–0.70)

## 2015-02-08 SURGERY — CANCELLED PROCEDURE

## 2015-02-08 MED ORDER — HEPARIN (PORCINE) IN NACL 100-0.45 UNIT/ML-% IJ SOLN
1350.0000 [IU]/h | INTRAMUSCULAR | Status: DC
  Administered 2015-02-09 – 2015-02-10 (×3): 1350 [IU]/h via INTRAVENOUS
  Filled 2015-02-08 (×2): qty 250

## 2015-02-08 NOTE — Progress Notes (Signed)
[facility] PROGRESS NOTE  MARS SCHEAFFER MHD:[id number] DOB: 1941-08-05 DOA: 02/04/2015 PCP: [name], MD  Brief narrative 74 year old male with history of CAD s/p CABG, chronic systolic and diastolic heart failure, s/p AVR withSt. 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 Patient was found to be in acute respiratory distress and placed on BiPAP. Chest x-ray showed pulmonary edema with significant leukocytosis and hemoglobin of 7.3.  Assessment/Plan Acute hypoxic respiratory failure Secondary to CHF exacerbation. Diabetes well with IV Lasix. Negative balance of 6.4 L since admission.   Acute on chronic combined systolic and diastolic CHF Last echo with EF 40%. Symptoms also worsened with underlying anemia.  Diabetes mellitus with IV Lasix Good diuresis with net negative of 6.4 L. transition to home dose by mouth Lasix. Monitor strict I/O and daily weight. Continue beta blocker, Imdur and statin.  Chest pain with elevated troponin Possibly demand ischemia. Required nitroglycerin drip upon admission. Remains chest pain-free. Troponin peaked at 1.59.   Continue metoprolol, statin Imdur and when necessary nitrates.  -plavix  held. Myoview in 12/14 without ischemia. PCI of SVG to right coronary artery in 9/15  Iron deficiency anemia Awaiting EGD when INR less than 2  Acute kidney injury Mild. Secondary to IV diuresis. Monitor.  ? Cecal colitis Abdominal CT done given persistent abdominal discomfort and showed a possible inflammation of the cecum. Placed on Flagyl. ( avoiding quinolone as it mayelevate INR) symptoms improved.  Diabetes mellitus type 2,  FSG elevated. A1c of 7.2. Will add Lantus  12 units daily at bedtime and sliding scale insulin.  History of aortic valve replacement  Coumadin held for possible GI bleed. Started on IV heparin.   Peripheral vascular disease prior SFA PTA with chronic LE disease. Holding plavix.   Diet: Heart healthy/diabetic. Nothing by mouth after midnight  DVT prophylaxis: INR therapeutic. On IV heparin  Code Status: DNR Family Communication: none at bedside today  Disposition Plan: home after gi eval   Consultants:  Cardiology  [facility]  Procedures:    Antibiotics:  augmentin Flagyl since 6/20  HPI/Subjective: Pain improved. No dyspnea  Objective: Filed Vitals:   02/08/15 1337  BP: 116/53  Pulse: 61  Temp: 97.6 F (36.4 C)  Resp: 15    Intake/Output Summary (Last 24 hours) at 02/08/15 1408 Last data filed at 02/08/15 1100  Gross per 24 hour  Intake 443.35 ml  Output   1300 ml  Net -856.65 ml   Filed Weights   02/07/15 0504 02/08/15 0447 02/08/15 1244  Weight: 88.043 kg (194 lb 1.6 oz) 93.622 kg (206 lb 6.4 oz) 88.451 kg (195 lb)    Exam:   General:  A and o. eating  Chest: Clear to auscultation bilaterally  CVS: Normal S1 and S2, systolic click, no murmurs  GI: Soft, nondistended, nontender, bowel sounds present  Musculoskeletal: Left BKA, edema resolved  CNS: Alert and oriented  Data Reviewed: Basic Metabolic Panel:  Recent Labs Lab 02/04/15 0415 02/05/15 1134 02/06/15 1025 02/07/15 0536 02/08/15 0341  NA 138 137 137 137 138  K 4.5 3.9 3.8 3.7 4.1  CL 106 103 102 100* 103  CO2 20* '25 27 26 26  '$ GLUCOSE 310* 165* 221* 292* 168*  BUN 25* 28* 27* 36* 37*  CREATININE 1.38* 1.43* 1.32* 1.31* 1.31*  CALCIUM 9.3 9.4 9.6 9.5 9.3  MG  --   --  2.1  --   --    Liver Function Tests: No results for input(s): AST, ALT, ALKPHOS, BILITOT, PROT, ALBUMIN in the last 168 hours. No results for input(s): LIPASE, AMYLASE in the last 168 hours. No results for input(s): AMMONIA in the last 168  hours. CBC:  Recent Labs Lab 02/04/15 0415  02/05/15 1812 02/06/15 0735 02/06/15 1822 02/07/15 0536 02/08/15 0341  WBC 17.2*  < > 8.7 8.1 7.8 8.4 9.0  NEUTROABS 10.6*  --   --   --   --   --   --   HGB 7.3*  < > 8.1* 8.1* 8.5* 8.5* 8.8*  HCT 25.7*  < > 27.3* 27.1* 28.6* 28.7* 30.2*  MCV 72.0*  < > 72.8* 71.7* 72.2* 72.1* 72.4*  PLT 460*  < > 305 265 349 303 351  < > = values in this interval not displayed. Cardiac Enzymes:  Recent Labs Lab 02/04/15 0413 02/04/15 1033 02/04/15 1518 02/05/15 1430  TROPONINI 0.05* 0.68* 1.50* 1.09*   BNP (last 3 results)  Recent Labs  12/28/14 0134 02/04/15 0413  BNP 504.3* 593.2*    ProBNP (last 3 results)  Recent Labs  05/10/14 0229 05/11/14 1530  PROBNP 820.3* 1302.0*    CBG:  Recent Labs Lab 02/07/15 1126 02/07/15 1639 02/07/15 2042 02/08/15 0729 02/08/15 1113  GLUCAP 219* 197* 193* 137* 218*    Recent Results (from the past 240 hour(s))  MRSA PCR Screening     Status: None   Collection Time: 02/04/15 11:26 AM  Result Value Ref Range Status   MRSA by PCR NEGATIVE NEGATIVE Final    Comment:        The GeneXpert MRSA Assay (FDA approved for NASAL specimens only), is one component of a comprehensive MRSA colonization surveillance program. It is not intended to diagnose MRSA infection nor to guide or monitor treatment for MRSA infections.      Studies: No results found.  Scheduled Meds: . sodium chloride   Intravenous Once  . allopurinol  300 mg Oral QHS  . fenofibrate  160 mg Oral Daily  . furosemide  80 mg Oral Daily  . insulin aspart  0-15 Units Subcutaneous TID WC  . insulin aspart  0-5 Units Subcutaneous QHS  . insulin glargine  12 Units Subcutaneous QHS  . isosorbide mononitrate  30 mg Oral Daily  . metoprolol succinate  50 mg Oral Daily  . metroNIDAZOLE  500 mg Oral 3 times per day  . pantoprazole (PROTONIX) IV  40 mg Intravenous Q12H  . potassium chloride SA  20 mEq Oral BID  .  pregabalin  50 mg Oral BID  . rosuvastatin  20 mg Oral q1800  . sucralfate  1 g Oral TID WC & HS   Continuous Infusions: . heparin 1,400 Units/hr (02/08/15 1300)   Time spent: 25 minutes  [facility]  [email or web address], password [facility] 02/08/2015, 2:08 PM  LOS: 4 days

## 2015-02-08 NOTE — Progress Notes (Signed)
Inpatient Diabetes Program Recommendations  AACE/ADA: New Consensus Statement on Inpatient Glycemic Control (2013)  Target Ranges:  Prepandial:   less than 140 mg/dL      Peak postprandial:   less than 180 mg/dL (1-2 hours)      Critically ill patients:  140 - 180 mg/dL   Results for Tanner Pena, Tanner Pena (MRN [id number]) as of 02/08/2015 13:38  Ref. Range 02/07/2015 07:19 02/07/2015 11:26 02/07/2015 16:39 02/07/2015 20:42 02/08/2015 07:29 02/08/2015 11:13  Glucose-Capillary Latest Ref Range: 65-99 mg/dL 203 (H) 219 (H) 197 (H) 193 (H) 137 (H) 218 (H)   Reason for Admission: CHF exacerbation  Diabetes history: DM 2 Outpatient Diabetes medications: Amaryl '1mg'$  BID, Januvia 50 mg Daily Current orders for Inpatient glycemic control: Lantus 12 units QHS, Novolog 0-15 units TID, Novolog 0-5 units QHS  Inpatient Diabetes Program Recommendations Insulin - Basal: Glucose has increased from 130's into the 200's while NPO. Please consider increasing basal insulin slightly to Lantus 14 units QHS.   Thanks,  Tama Headings RN, MSN, [facility] Inpatient Diabetes Coordinator Team Pager [phone] (8AM-5PM)

## 2015-02-08 NOTE — Procedures (Signed)
Pt is resting well on room air and refuses the use of BIPAP.  RT will continue to monitor pt and placed if asked or needed.

## 2015-02-08 NOTE — Progress Notes (Signed)
Pt arrived in the Endo unit for an EGD and they were still on a Heparin Drip. Dr. Michail Sermon notified, orders never written to hold heparin. Per Dr. Michail Sermon, pt will need to be rescheduled for tomorrow and he will write orders for Heparin to be held. Cyril Mourning, RN notified. Pt returned to room/brt, rn

## 2015-02-08 NOTE — Progress Notes (Signed)
Subjective: He ports some CP earlier that resolved on its own.  Breathing a lot better.   Objective: Vital signs in last 24 hours: Temp:  [98.1 F (36.7 C)-98.3 F (36.8 C)] 98.1 F (36.7 C) (06/22 0447) Pulse Rate:  [50-59] 52 (06/22 0447) Resp:  [18] 18 (06/21 1505) BP: (122-140)/(48-54) 140/54 mmHg (06/22 0447) SpO2:  [96 %-100 %] 97 % (06/22 0447) Weight:  [206 lb 6.4 oz (93.622 kg)] 206 lb 6.4 oz (93.622 kg) (06/22 0447) Last BM Date: 02/06/15  Intake/Output from previous day: 06/21 0701 - 06/22 0700 In: 683.4 [P.O.:360; I.V.:323.4] Out: 1300 [Urine:1300] Intake/Output this shift:    Medications Scheduled Meds: . sodium chloride   Intravenous Once  . allopurinol  300 mg Oral QHS  . fenofibrate  160 mg Oral Daily  . furosemide  80 mg Oral Daily  . insulin aspart  0-15 Units Subcutaneous TID WC  . insulin aspart  0-5 Units Subcutaneous QHS  . insulin glargine  12 Units Subcutaneous QHS  . isosorbide mononitrate  30 mg Oral Daily  . metoprolol succinate  50 mg Oral Daily  . metroNIDAZOLE  500 mg Oral 3 times per day  . pantoprazole (PROTONIX) IV  40 mg Intravenous Q12H  . potassium chloride SA  20 mEq Oral BID  . pregabalin  50 mg Oral BID  . rosuvastatin  20 mg Oral q1800  . sucralfate  1 g Oral TID WC & HS   Continuous Infusions: . heparin 1,400 Units/hr (02/07/15 1659)   PRN Meds:.acetaminophen **OR** acetaminophen, albuterol, alum & mag hydroxide-simeth, nitroGLYCERIN, ondansetron **OR** ondansetron (ZOFRAN) IV, oxyCODONE-acetaminophen **AND** oxyCODONE  PE: General appearance: alert, cooperative and no distress Lungs: clear to auscultation bilaterally Heart: regular rate and rhythm Abdomen: +BS, Nontender Extremities: no LEE Pulses: 2+ and symmetric Skin: Warm and dry Neurologic: Grossly normal  Lab Results:   Recent Labs  02/06/15 1822 02/07/15 0536 02/08/15 0341  WBC 7.8 8.4 9.0  HGB 8.5* 8.5* 8.8*  HCT 28.6* 28.7* 30.2*  PLT 349 303  351   BMET  Recent Labs  02/06/15 1025 02/07/15 0536 02/08/15 0341  NA 137 137 138  K 3.8 3.7 4.1  CL 102 100* 103  CO2 '27 26 26  '$ GLUCOSE 221* 292* 168*  BUN 27* 36* 37*  CREATININE 1.32* 1.31* 1.31*  CALCIUM 9.6 9.5 9.3   PT/INR  Recent Labs  02/06/15 2109 02/07/15 0536 02/08/15 0341  LABPROT 24.4* 24.3* 23.4*  INR 2.22* 2.21* 2.10*    Assessment/Plan 74 year old Caucasian male with past medical history of CAD s/p 6v CABG in 1990 with St. Jude AVR, redo CABG in 1994 to the left coronary system, PAD s/p stents to bilateral SFAs and L BKA (06/2014), HTN, HLD, DM and OSA present acute respiratory failure, CHF, elevated WBC and chest pain   Active Problems:   H/O aortic valve replacement- St Jude   CAD - CABG '90 with re do '94    PVD prior SFA PTA with chronic LE disease, not amenable to PTA   DM2 (diabetes mellitus, type 2)   Cardiomyopathy, ischemic - EF 25-30% by echo 5/14   COPD (chronic obstructive pulmonary disease)   LBBB (left bundle branch block)   S/P below knee amputation   Acute respiratory failure   CHF exacerbation   Acute respiratory failure with hypoxia   Iron deficiency anemia   Systolic CHF, acute on chronic   NSTEMI (non-ST elevated myocardial infarction)   Abdominal pain  1. A/C  systolic heart failure, 2. Mechanical aortic valve,  3. GI bleeding, stopped 4. Elevated troponin due to demand ischemia  Net fluids: -0.6L/-6.3L.  PO lasix '80mg'$  daily.  Appears euvolemic.  SCr stable.  On IV heparin and will need coumadin resume prior to DC.  EGD today.     LOS: 4 days    Elfrida Pixley PA-C 02/08/2015 9:34 AM

## 2015-02-08 NOTE — Progress Notes (Signed)
[place] for heparin Indication: mechanical aortic valve  No Known Allergies  Patient Measurements: Height: '5\' 8"'$  (172.7 cm) Weight: 206 lb 6.4 oz (93.622 kg) IBW/kg (Calculated) : 68.4 Heparin Dosing Weight: 87kg  Vital Signs: Temp: 98.1 F (36.7 C) (06/22 0447) Temp Source: Oral (06/22 0447) BP: 140/54 mmHg (06/22 0447) Pulse Rate: 52 (06/22 0447)  Labs:  Recent Labs  02/05/15 1430  02/06/15 1025 02/06/15 1822 02/06/15 2109 02/07/15 0536 02/07/15 1608 02/08/15 0341  HGB  --   < >  --  8.5*  --  8.5*  --  8.8*  HCT  --   < >  --  28.6*  --  28.7*  --  30.2*  PLT  --   < >  --  349  --  303  --  351  LABPROT  --   < >  --   --  24.4* 24.3*  --  23.4*  INR  --   < >  --   --  2.22* 2.21*  --  2.10*  HEPARINUNFRC  --   --   --   --   --  0.13* 0.37 0.67  CREATININE  --   --  1.32*  --   --  1.31*  --  1.31*  TROPONINI 1.09*  --   --   --   --   --   --   --   < > = values in this interval not displayed.  Estimated Creatinine Clearance: 54.9 mL/min (by C-G formula based on Cr of 1.31).   Medical History: Past Medical History  Diagnosis Date  . Diabetes mellitus 2007  . Hypertension   . Gout   . Hypercholesteremia   . Peripheral vascular disease     stents lower extremity  . COPD (chronic obstructive pulmonary disease)   . S/P aortic valve replacement 1990    a. St. Jude  . Chronic back pain   . Dysphagia   . Neuromuscular disorder   . Peripheral neuropathy   . Critical lower limb ischemia   . CAD (coronary artery disease)     a. 05/13/14 [name] s/p overlapping DESx2 to SVG to RCA. b.  s/p CABG '90 with redo '94 & stent to RCA SVG in 2005  . Chronic toe ulcer     a. left foot  . Shortness of breath   . Myocardial infarction   . CHF (congestive heart failure)   . Stone in kidney   . GERD (gastroesophageal reflux disease)   . Arthritis   . Sleep apnea     tested greater than 7 years ago per patient     Assessment: 77 YOM with a mechanical aortic valve on warfarin (goal INR 2.5-3.5 per Coumadin Clinic notes) who has been having mid abdominal pain for ~3 weeks with unclear etiology along with iron deficiency anemia. GI is following and planning an EGD to evaluate for PUD. Awaiting INR to be <2 in case biopsies need to be taken.  Anticoagulation: Mechanical aortic valve. INR goal 2.5-3.5 per Coumadin clinic. Admit INR 2.62. Last dose of Coumadin noted 6/18-now hold. Concern for GI bleed. INR down to 2.1 (Flagyl may keep INR elevated). Hgb improved to 8.8. Heparin level 0.67 in goal. -Home Coumadin dose: 2.5 mg on Mon/Fri; 5 mg on all other days   Goal of Therapy:  Heparin level 0.3-0.7 units/ml Monitor platelets by anticoagulation protocol: Yes   Plan:  - Tentative EGD  6/22 - IV heparin -Daily PT/INR , HL, CBC (watch INR on Flagyl)   Hannahmarie Asberry S. Alford Highland, PharmD, [place] Clinical Staff Pharmacist Pager [phone]   02/08/2015 9:22 AM

## 2015-02-09 ENCOUNTER — Inpatient Hospital Stay (HOSPITAL_COMMUNITY): Payer: Medicare Other | Admitting: Certified Registered Nurse Anesthetist

## 2015-02-09 ENCOUNTER — Encounter (HOSPITAL_COMMUNITY): Payer: Self-pay | Admitting: Certified Registered Nurse Anesthetist

## 2015-02-09 ENCOUNTER — Encounter (HOSPITAL_COMMUNITY)
Admission: EM | Disposition: A | Discharge status: Home / Self Care | Payer: Self-pay | Source: Home / Self Care | Attending: Internal Medicine

## 2015-02-09 DIAGNOSIS — D509 Iron deficiency anemia, unspecified: Secondary | ICD-10-CM

## 2015-02-09 HISTORY — PX: ESOPHAGOGASTRODUODENOSCOPY: SHX5428

## 2015-02-09 LAB — GLUCOSE, CAPILLARY
GLUCOSE-CAPILLARY: 161 mg/dL — AB (ref 65–99)
GLUCOSE-CAPILLARY: 170 mg/dL — AB (ref 65–99)
Glucose-Capillary: 232 mg/dL — ABNORMAL HIGH (ref 65–99)
Glucose-Capillary: 246 mg/dL — ABNORMAL HIGH (ref 65–99)
Glucose-Capillary: 188 mg/dL — ABNORMAL HIGH (ref 65–99)

## 2015-02-09 LAB — CBC
HEMATOCRIT: 29.8 % — AB (ref 39.0–52.0)
HEMOGLOBIN: 8.7 g/dL — AB (ref 13.0–17.0)
MCH: 21.3 pg — ABNORMAL LOW (ref 26.0–34.0)
MCHC: 29.2 g/dL — ABNORMAL LOW (ref 30.0–36.0)
MCV: 73 fL — AB (ref 78.0–100.0)
Platelets: 316 10*3/uL (ref 150–400)
RBC: 4.08 MIL/uL — AB (ref 4.22–5.81)
RDW: 19.9 % — ABNORMAL HIGH (ref 11.5–15.5)
WBC: 9 10*3/uL (ref 4.0–10.5)

## 2015-02-09 LAB — BASIC METABOLIC PANEL
ANION GAP: 8 (ref 5–15)
BUN: 31 mg/dL — ABNORMAL HIGH (ref 6–20)
CALCIUM: 9.7 mg/dL (ref 8.9–10.3)
CO2: 26 mmol/L (ref 22–32)
CREATININE: 1.23 mg/dL (ref 0.61–1.24)
Chloride: 104 mmol/L (ref 101–111)
GFR calc non Af Amer: 56 mL/min — ABNORMAL LOW (ref >60)
Glucose, Bld: 136 mg/dL — ABNORMAL HIGH (ref 65–99)
Potassium: 4.2 mmol/L (ref 3.5–5.1)
Sodium: 138 mmol/L (ref 135–145)

## 2015-02-09 LAB — PROTIME-INR
INR: 1.68 — ABNORMAL HIGH (ref 0.00–1.49)
Prothrombin Time: 19.8 seconds — ABNORMAL HIGH (ref 11.6–15.2)

## 2015-02-09 LAB — HEPARIN LEVEL (UNFRACTIONATED): HEPARIN UNFRACTIONATED: 0.35 [IU]/mL (ref 0.30–0.70)

## 2015-02-09 SURGERY — EGD (ESOPHAGOGASTRODUODENOSCOPY)
Anesthesia: Monitor Anesthesia Care

## 2015-02-09 MED ORDER — PANTOPRAZOLE SODIUM 40 MG PO TBEC
40.0000 mg | DELAYED_RELEASE_TABLET | Freq: Two times a day (BID) | ORAL | Status: DC
Start: 2015-02-09 — End: 2015-02-12
  Administered 2015-02-09 – 2015-02-12 (×6): 40 mg via ORAL
  Filled 2015-02-09 (×6): qty 1

## 2015-02-09 MED ORDER — LACTATED RINGERS IV SOLN
INTRAVENOUS | Status: DC | PRN
  Administered 2015-02-09: 08:00:00 via INTRAVENOUS

## 2015-02-09 MED ORDER — PROPOFOL 10 MG/ML IV BOLUS
INTRAVENOUS | Status: DC | PRN
  Administered 2015-02-09: 20 mg via INTRAVENOUS
  Administered 2015-02-09: 30 mg via INTRAVENOUS

## 2015-02-09 MED ORDER — WARFARIN SODIUM 5 MG PO TABS
6.0000 mg | ORAL_TABLET | Freq: Once | ORAL | Status: AC
  Administered 2015-02-09: 6 mg via ORAL
  Filled 2015-02-09 (×2): qty 1

## 2015-02-09 MED ORDER — WARFARIN - PHARMACIST DOSING INPATIENT
Freq: Every day | Status: DC
  Administered 2015-02-09: 18:00:00

## 2015-02-09 MED ORDER — PROPOFOL INFUSION 10 MG/ML OPTIME
INTRAVENOUS | Status: DC | PRN
  Administered 2015-02-09: 50 ug/kg/min via INTRAVENOUS

## 2015-02-09 NOTE — Interval H&P Note (Signed)
History and Physical Interval Note:  02/09/2015 8:28 AM  Tanner Pena  has presented today for surgery, with the diagnosis of anemia  The various methods of treatment have been discussed with the patient and family. After consideration of risks, benefits and other options for treatment, the patient has consented to  Procedure(s): ESOPHAGOGASTRODUODENOSCOPY (EGD) (N/A) as a surgical intervention .  The patient's history has been reviewed, patient examined, no change in status, stable for surgery.  I have reviewed the patient's chart and labs.  Questions were answered to the patient's satisfaction.     [place] C.

## 2015-02-09 NOTE — Progress Notes (Addendum)
ANTICOAGULATION CONSULT NOTE - Follow Up Consult  Pharmacy Consult for Heparin Indication: mech AVR  No Known Allergies  Patient Measurements: Height: '5\' 9"'$  (175.3 cm) Weight: 204 lb 9.6 oz (92.806 kg) IBW/kg (Calculated) : 70.7 Heparin Dosing Weight: 87 kg  Vital Signs: Temp: 97.8 F (36.6 C) (06/23 0857) Temp Source: Oral (06/23 0857) BP: 168/54 mmHg (06/23 0910) Pulse Rate: 51 (06/23 0910)  Labs:  Recent Labs  02/07/15 0536 02/07/15 1608 02/08/15 0341 02/09/15 0506  HGB 8.5*  --  8.8* 8.7*  HCT 28.7*  --  30.2* 29.8*  PLT 303  --  351 316  LABPROT 24.3*  --  23.4* 19.8*  INR 2.21*  --  2.10* 1.68*  HEPARINUNFRC 0.13* 0.37 0.67 0.35  CREATININE 1.31*  --  1.31* 1.23    Estimated Creatinine Clearance: 59.2 mL/min (by C-G formula based on Cr of 1.23).  Assessment: 62 YOM who presented with acute dyspnea and chest pain. Also reported mild cough, non-productive. Hgb 7.3. Acute hypoxic respiratory failure due to CHF. Pt on chronic Coumadin for mechanical AVR.   Anticoagulation: Mechanical aortic valve. INR goal 2.5-3.5 per Coumadin clinic. Admit INR 2.62. Last dose of Coumadin noted 6/18-now hold. Concern for GI bleed. INR down to 1.68. Hgb 8.7 stable. Heparin level 0.35 in goal this AM (1 hr after turned off for EGD). Heparin level was trending up yesterday.  -Home Coumadin dose: 2.5 mg on Mon/Fri; 5 mg on all other days   Goal of Therapy:  Heparin level 0.3-0.7 units/ml  INR 2.5-3.5 Monitor platelets by anticoagulation protocol: Yes    Plan:  Resume IV heparin at 1350 units/hr  Resume Coumadin '6mg'$  po x 1 tonight. PLEASE D/C THE FLAGYL (day #4) due to severe drug interaction with Coumadin ASAP!!  -Daily PT/INR , HL, CBC  -Change PPI to po     Ashlee Player S. Alford Highland, PharmD, BCPS Clinical Staff Pharmacist Pager [phone]  Eilene Ghazi Stillinger 02/09/2015,10:39 AM

## 2015-02-09 NOTE — Brief Op Note (Signed)
EGD normal. Consider outpt capsule if anemia recurs but low yield to do right now without any overt bleeding. F/U with GI in 3 weeks. Ok to resume Coumadin and Heparin. Will sign off. Call if questions.

## 2015-02-09 NOTE — H&P (View-Only) (Signed)
[facility] Progress Note  Subjective: Patient does not have any abdominal pain today. No stool  Objective: Vital signs in last 24 hours: Temp:  [97.9 F (36.6 C)-98.7 F (37.1 C)] 97.9 F (36.6 C) (06/21 0504) Pulse Rate:  [53-61] 53 (06/21 0504) Resp:  [20] 20 (06/21 0504) BP: (120-134)/(33-66) 125/37 mmHg (06/21 0504) SpO2:  [96 %-98 %] 98 % (06/21 0504) Weight:  [88.043 kg (194 lb 1.6 oz)] 88.043 kg (194 lb 1.6 oz) (06/21 0504) Weight change: -3.745 kg (-8 lb 4.1 oz)   PE: Abdomen soft nondistended  Lab Results: Results for orders placed or performed during the hospital encounter of 02/04/15 (from the past 24 hour(s))  Basic metabolic panel     Status: Abnormal   Collection Time: 02/06/15 10:25 AM  Result Value Ref Range   Sodium 137 135 - 145 mmol/L   Potassium 3.8 3.5 - 5.1 mmol/L   Chloride 102 101 - 111 mmol/L   CO2 27 22 - 32 mmol/L   Glucose, Bld 221 (H) 65 - 99 mg/dL   BUN 27 (H) 6 - 20 mg/dL   Creatinine, Ser 1.32 (H) 0.61 - 1.24 mg/dL   Calcium 9.6 8.9 - 10.3 mg/dL   GFR calc non Af Amer 51 (L) >60 mL/min   GFR calc Af Amer 60 (L) >60 mL/min   Anion gap 8 5 - 15  Magnesium     Status: None   Collection Time: 02/06/15 10:25 AM  Result Value Ref Range   Magnesium 2.1 1.7 - 2.4 mg/dL  Glucose, capillary     Status: Abnormal   Collection Time: 02/06/15 11:36 AM  Result Value Ref Range   Glucose-Capillary 184 (H) 65 - 99 mg/dL   Comment 1 Notify RN    Comment 2 Document in Chart   Glucose, capillary     Status: Abnormal   Collection Time: 02/06/15  4:17 PM  Result Value Ref Range   Glucose-Capillary 205 (H) 65 - 99 mg/dL   Comment 1 Notify RN    Comment 2 Document in Chart   CBC     Status: Abnormal   Collection Time: 02/06/15  6:22 PM  Result Value Ref Range   WBC 7.8 4.0 - 10.5 K/uL   RBC 3.96 (L) 4.22 - 5.81 MIL/uL   Hemoglobin 8.5 (L) 13.0 - 17.0 g/dL   HCT 28.6 (L) 39.0 - 52.0 %   MCV 72.2 (L) 78.0 - 100.0 fL   MCH 21.5 (L) 26.0 -  34.0 pg   MCHC 29.7 (L) 30.0 - 36.0 g/dL   RDW 18.9 (H) 11.5 - 15.5 %   Platelets 349 150 - 400 K/uL  Glucose, capillary     Status: Abnormal   Collection Time: 02/06/15  8:45 PM  Result Value Ref Range   Glucose-Capillary 189 (H) 65 - 99 mg/dL  Protime-INR     Status: Abnormal   Collection Time: 02/06/15  9:09 PM  Result Value Ref Range   Prothrombin Time 24.4 (H) 11.6 - 15.2 seconds   INR 2.22 (H) 0.00 - 2.69  Basic metabolic panel     Status: Abnormal   Collection Time: 02/07/15  5:36 AM  Result Value Ref Range   Sodium 137 135 - 145 mmol/L   Potassium 3.7 3.5 - 5.1 mmol/L   Chloride 100 (L) 101 - 111 mmol/L   CO2 26 22 - 32 mmol/L   Glucose, Bld 292 (H) 65 - 99 mg/dL   BUN 36 (H) 6 -  20 mg/dL   Creatinine, Ser 1.31 (H) 0.61 - 1.24 mg/dL   Calcium 9.5 8.9 - 10.3 mg/dL   GFR calc non Af Amer 52 (L) >60 mL/min   GFR calc Af Amer >60 >60 mL/min   Anion gap 11 5 - 15  CBC     Status: Abnormal   Collection Time: 02/07/15  5:36 AM  Result Value Ref Range   WBC 8.4 4.0 - 10.5 K/uL   RBC 3.98 (L) 4.22 - 5.81 MIL/uL   Hemoglobin 8.5 (L) 13.0 - 17.0 g/dL   HCT 28.7 (L) 39.0 - 52.0 %   MCV 72.1 (L) 78.0 - 100.0 fL   MCH 21.4 (L) 26.0 - 34.0 pg   MCHC 29.6 (L) 30.0 - 36.0 g/dL   RDW 19.2 (H) 11.5 - 15.5 %   Platelets 303 150 - 400 K/uL  Heparin level (unfractionated)     Status: Abnormal   Collection Time: 02/07/15  5:36 AM  Result Value Ref Range   Heparin Unfractionated 0.13 (L) 0.30 - 0.70 IU/mL  Protime-INR     Status: Abnormal   Collection Time: 02/07/15  5:36 AM  Result Value Ref Range   Prothrombin Time 24.3 (H) 11.6 - 15.2 seconds   INR 2.21 (H) 0.00 - 1.49  Glucose, capillary     Status: Abnormal   Collection Time: 02/07/15  7:19 AM  Result Value Ref Range   Glucose-Capillary 203 (H) 65 - 99 mg/dL   Comment 1 Notify RN    Comment 2 Document in Chart     Studies/Results: Ct Abdomen Pelvis Wo Contrast  02/05/2015   CLINICAL DATA:  Patient with epigastric and  generalized abdominal pain for 2 weeks.  EXAM: CT ABDOMEN AND PELVIS WITHOUT CONTRAST  TECHNIQUE: Multidetector CT imaging of the abdomen and pelvis was performed following the standard protocol without IV contrast.  COMPARISON:  CT abdomen pelvis 04/22/2012  FINDINGS: Lower chest: Re- demonstrated pleural-based calcifications within the left lower hemi thorax. Normal heart size. Unchanged probable scarring left lower lobe.  Hepatobiliary: Liver is normal in size and contour without focal hepatic lesion identified. Gallbladder is unremarkable. No intrahepatic or extrahepatic biliary ductal dilatation.  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 Right adrenal gland is unremarkable. Re- demonstrated focal atrophy of the inferior pole of the right kidney. 3 mm nonobstructing stone superior pole right kidney. Stable bilateral perinephric fat stranding. Urinary bladder is unremarkable. No ureterolithiasis. No hydronephrosis.  Stomach/Bowel: Small fat containing periumbilical hernia. There is mild wall thickening of the cecum. No significant surrounding fat stranding. Normal appendix. No evidence for bowel obstruction. Descending and sigmoid colonic diverticulosis without evidence for acute diverticulitis. No free fluid or free intraperitoneal air.  Vascular/Lymphatic: Extensive calcification of the abdominal aorta. No retroperitoneal lymphadenopathy.  Other: Fat containing periumbilical hernia.  Musculoskeletal: Lower lumbar spine degenerative changes. No aggressive or acute appearing osseous lesions.  IMPRESSION: Wall thickening of the cecum without significant surrounding inflammatory stranding. Findings may represent combination of underdistention and or possible focal colitis. Recommend correlation with colonoscopy in the nonacute setting to exclude the possibility of underlying mass.   Electronically Signed   By: Lovey Newcomer M.D.   On: 02/05/2015  22:04      Assessment: Anemia so should with Coumadin and some dyspeptic symptoms  Plan: Continue PPI. Schedule for EGD late tomorrow morning hopefully with improved coags.    Drayson Dorko C 02/07/2015, 8:46 AM  Pager [phone] If no answer  or after 5 PM call [phone]

## 2015-02-09 NOTE — Transfer of Care (Signed)
Immediate Anesthesia Transfer of Care Note  Patient: Tanner Pena  Procedure(s) Performed: Procedure(s): ESOPHAGOGASTRODUODENOSCOPY (EGD) (N/A)  Patient Location: PACU and Endoscopy Unit  Anesthesia Type:MAC  Level of Consciousness: awake, alert , oriented and patient cooperative  Airway & Oxygen Therapy: Patient Spontanous Breathing and Patient connected to nasal cannula oxygen  Post-op Assessment: Report given to RN and Post -op Vital signs reviewed and stable  Post vital signs: Reviewed and stable  Last Vitals:  Filed Vitals:   02/09/15 0734  BP: 177/60  Pulse: 60  Temp: 37.2 C  Resp: 18    Complications: No apparent anesthesia complications

## 2015-02-09 NOTE — Care Management Note (Addendum)
Case Management Note  Patient Details  Name: Tanner Pena MRN: [id number] Date of Birth: 11-18-1940  Subjective/Objective:      Pt admitted for CHF exacerbation.               Action/Plan: CM to monitor for additional disposition needs.    Expected Discharge Date:                  Expected Discharge Plan:  Home/Self Care  In-House Referral:     Discharge planning Services  CM Consult  Post Acute Care Choice:    Choice offered to:     DME Arranged:    DME Agency:     HH Arranged:    HH Agency:     Status of Service:  In process, will continue to follow  Medicare Important Message Given:  Yes Date Medicare IM Given:  02/09/15 Medicare IM give by:  Jacqlyn Krauss, RN,BSN  Date Additional Medicare IM Given:   02-10-15 Additional Medicare Important Message give by:   Jacqlyn Krauss, RN,BSN   If discussed at Long Length of Stay Meetings, dates discussed:    Additional Comments:  Bethena Roys, RN 02/09/2015, 12:15 PM

## 2015-02-09 NOTE — Op Note (Signed)
[facility] [place], [id number]   ENDOSCOPY PROCEDURE REPORT  PATIENT: Tanner Pena, Tanner Pena  MR#: [id number] BIRTHDATE: 08/09/1941 , 71  yrs. old GENDER: male ENDOSCOPIST: Wilford Corner, MD REFERRED BY: PROCEDURE DATE:  2015/02/27 PROCEDURE:  EGD, diagnostic ASA CLASS:     Class IV INDICATIONS:  iron deficiency anemia. MEDICATIONS: Monitored anesthesia care and Per Anesthesia TOPICAL ANESTHETIC:  DESCRIPTION OF PROCEDURE: After the risks benefits and alternatives of the procedure were thoroughly explained, informed consent was obtained.  The Pentax Gastroscope [id number] endoscope was introduced through the mouth and advanced to the second portion of the duodenum , Without limitations.  The instrument was slowly withdrawn as the mucosa was fully examined. Estimated blood loss is zero unless otherwise noted in this procedure report.    Esophagus and GEJ normal. Z-line normal in appearance. Stomach normal. Duodenal bulb and 2nd portion of the duodenum normal. Retroflexed views revealed no abnormalities.     The scope was then withdrawn from the patient and the procedure completed.  COMPLICATIONS: There were no immediate complications.  ENDOSCOPIC IMPRESSION:     Normal EGD  RECOMMENDATIONS:     Resume anticoagulation; Consider outpt capsule endo if anemia worsens vs hematology consult   eSigned:  Wilford Corner, MD 2015-02-27 9:00 AM    CC:  CPT CODES: ICD CODES:  The ICD and CPT codes recommended by this software are interpretations from the data that the clinical staff has captured with the software.  The verification of the translation of this report to the ICD and CPT codes and modifiers is the sole responsibility of the health care institution and practicing physician where this report was generated.  [employer] will not be held responsible for the validity of the ICD and CPT codes included on this  report.  AMA assumes no liability for data contained or not contained herein. CPT is a Designer, television/film set of the [employer].  PATIENT NAME:  Tanner Pena, Tanner Pena MR#: [id number]

## 2015-02-09 NOTE — Anesthesia Postprocedure Evaluation (Signed)
Anesthesia Post Note  Patient: Tanner Pena  Procedure(s) Performed: Procedure(s) (LRB): ESOPHAGOGASTRODUODENOSCOPY (EGD) (N/A)  Anesthesia type: MAC  Patient location: PACU  Post pain: Pain level controlled  Post assessment: Post-op Vital signs reviewed  Last Vitals: BP 119/79 mmHg  Pulse 62  Temp(Src) 36.9 C (Oral)  Resp 17  Ht '5\' 9"'$  (1.753 m)  Wt 204 lb 9.6 oz (92.806 kg)  BMI 30.20 kg/m2  SpO2 98%  Post vital signs: Reviewed  Level of consciousness: awake  Complications: No apparent anesthesia complications

## 2015-02-09 NOTE — Progress Notes (Addendum)
No complaints. GI workup is unremarkable. Vital signs are stable. Crisp aortic valve sounds are heard.  Plan: Agree with resuming Coumadin and continuing heparin until INR 2.0.

## 2015-02-09 NOTE — Discharge Instructions (Signed)

## 2015-02-09 NOTE — Progress Notes (Signed)
[facility] PROGRESS NOTE  Tanner Pena ZOX:[id number] DOB: 10-Feb-1941 DOA: 02/04/2015 PCP: [name], MD  Brief narrative 74 year old male with history of CAD s/p CABG, chronic systolic and diastolic heart failure, s/p AVR withSt. 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 Patient was found to be in acute respiratory distress and placed on BiPAP. Chest x-ray showed pulmonary edema with significant leukocytosis and hemoglobin of 7.3.  Assessment/Plan Acute hypoxic respiratory failure Secondary to CHF exacerbation. euvolemic  Acute on chronic combined systolic and diastolic CHF euvolemic  Chest pain with elevated troponin Possibly demand ischemia. Required nitroglycerin drip upon admission. Remains chest pain-free. Troponin peaked at 1.59.   Iron deficiency anemia EGD ok.   Acute kidney injury Mild. Secondary to IV diuresis. Monitor.  ? Cecal colitis No further pain. D/c abx and monitor  Diabetes mellitus type 2,  FSG elevated. A1c of 7.2. Will add Lantus 12 units daily at bedtime and sliding scale insulin.  History of aortic valve replacement  Resume heparin and warfarin  Code Status: DNR Family Communication: none at bedside today  Disposition Plan: home after gi eval   Consultants:  Cardiology  [facility]  Procedures:    Antibiotics:    HPI/Subjective: No bleeding, SOB, pain  Objective: Filed Vitals:   02/09/15 1334  BP: 119/79  Pulse: 62  Temp: 98.5 F (36.9 C)  Resp: 17    Intake/Output Summary (Last 24 hours) at 02/09/15 1510 Last data filed at 02/09/15 1300  Gross per 24 hour  Intake    660 ml  Output   1750 ml  Net  -1090 ml   Filed Weights   02/08/15 0447 02/08/15 1244 02/09/15 0532  Weight: 93.622 kg (206 lb 6.4 oz) 88.451 kg (195 lb) 92.806 kg (204 lb 9.6 oz)    Exam:   General:  A and o. eating  Chest: Clear to auscultation bilaterally  CVS: Normal S1 and S2, systolic click, no murmurs  GI: Soft, nondistended, nontender, bowel sounds present  Musculoskeletal: Left BKA, edema resolved  CNS: Alert and oriented  Data Reviewed: Basic Metabolic Panel:  Recent Labs Lab 02/05/15 1134 02/06/15 1025 02/07/15 0536 02/08/15 0341 02/09/15 0506  NA 137 137 137 138 138  K 3.9 3.8 3.7 4.1 4.2  CL 103 102 100* 103 104  CO2 '25 27 26 26 26  '$ GLUCOSE 165* 221* 292* 168* 136*  BUN 28* 27* 36* 37* 31*  CREATININE 1.43* 1.32* 1.31* 1.31* 1.23  CALCIUM 9.4 9.6 9.5 9.3 9.7  MG  --  2.1  --   --   --    Liver Function Tests: No results for input(s): AST, ALT, ALKPHOS, BILITOT, PROT, ALBUMIN in the last 168 hours. No results for input(s): LIPASE, AMYLASE in the last 168 hours. No results for input(s): AMMONIA in the last 168 hours. CBC:  Recent Labs Lab 02/04/15 0415  02/06/15 0735 02/06/15 1822 02/07/15 0536 02/08/15 0341 02/09/15 0506  WBC 17.2*  < > 8.1 7.8 8.4 9.0 9.0  NEUTROABS 10.6*  --   --   --   --   --   --   HGB 7.3*  < > 8.1* 8.5* 8.5* 8.8* 8.7*  HCT 25.7*  < > 27.1* 28.6* 28.7* 30.2* 29.8*  MCV  72.0*  < > 71.7* 72.2* 72.1* 72.4* 73.0*  PLT 460*  < > 265 349 303 351 316  < > = values in this interval not displayed. Cardiac Enzymes:  Recent Labs Lab 02/04/15 0413 02/04/15 1033 02/04/15 1518 02/05/15 1430  TROPONINI 0.05* 0.68* 1.50* 1.09*   BNP (last 3 results)  Recent Labs  12/28/14 0134 02/04/15 0413  BNP 504.3* 593.2*    ProBNP (last 3 results)  Recent Labs  05/10/14 0229 05/11/14 1530  PROBNP 820.3* 1302.0*    CBG:  Recent Labs Lab 02/08/15 1624 02/08/15 2008 02/09/15 0739 02/09/15 0950 02/09/15 1127  GLUCAP 217* 194* 161* 170* 232*    Recent Results (from the  past 240 hour(s))  MRSA PCR Screening     Status: None   Collection Time: 02/04/15 11:26 AM  Result Value Ref Range Status   MRSA by PCR NEGATIVE NEGATIVE Final    Comment:        The GeneXpert MRSA Assay (FDA approved for NASAL specimens only), is one component of a comprehensive MRSA colonization surveillance program. It is not intended to diagnose MRSA infection nor to guide or monitor treatment for MRSA infections.      Studies: No results found.  Scheduled Meds: . sodium chloride   Intravenous Once  . allopurinol  300 mg Oral QHS  . fenofibrate  160 mg Oral Daily  . furosemide  80 mg Oral Daily  . insulin aspart  0-15 Units Subcutaneous TID WC  . insulin aspart  0-5 Units Subcutaneous QHS  . insulin glargine  12 Units Subcutaneous QHS  . isosorbide mononitrate  30 mg Oral Daily  . metoprolol succinate  50 mg Oral Daily  . metroNIDAZOLE  500 mg Oral 3 times per day  . pantoprazole  40 mg Oral BID  . potassium chloride SA  20 mEq Oral BID  . pregabalin  50 mg Oral BID  . rosuvastatin  20 mg Oral q1800  . sucralfate  1 g Oral TID WC & HS  . warfarin  6 mg Oral ONCE-1800  . Warfarin - Pharmacist Dosing Inpatient   Does not apply q1800   Continuous Infusions: . heparin 1,350 Units/hr (02/09/15 1419)   Time spent: 25 minutes  [facility]  [email or web address], password [facility] 02/09/2015, 3:10 PM  LOS: 5 days

## 2015-02-09 NOTE — Anesthesia Preprocedure Evaluation (Signed)
Anesthesia Evaluation  Patient identified by MRN, date of birth, ID band Patient awake    Reviewed: Allergy & Precautions, NPO status , Patient's Chart, lab work & pertinent test results  History of Anesthesia Complications Negative for: history of anesthetic complications  Airway Mallampati: II  TM Distance: >3 FB Neck ROM: Full    Dental no notable dental hx. (+) Edentulous Upper, Edentulous Lower, Dental Advisory Given   Pulmonary shortness of breath and with exertion, sleep apnea , pneumonia -, COPD COPD inhaler, former smoker,  breath sounds clear to auscultation  Pulmonary exam normal       Cardiovascular hypertension, Pt. on medications and Pt. on home beta blockers + angina + CAD, + Past MI, + Cardiac Stents, + CABG, + Peripheral Vascular Disease and +CHF Normal cardiovascular exam+ dysrhythmias + Valvular Problems/Murmurs AS Rhythm:Regular Rate:Normal     Neuro/Psych negative neurological ROS  negative psych ROS   GI/Hepatic Neg liver ROS, GERD-  Medicated and Controlled,  Endo/Other  diabetes, Type 2, Oral Hypoglycemic Agents  Renal/GU Renal disease     Musculoskeletal  (+) Arthritis -,   Abdominal   Peds  Hematology negative hematology ROS (+) anemia ,   Anesthesia Other Findings   Reproductive/Obstetrics negative OB ROS                             Anesthesia Physical  Anesthesia Plan  ASA: IV  Anesthesia Plan: MAC   Post-op Pain Management:    Induction: Intravenous  Airway Management Planned:   Additional Equipment:   Intra-op Plan:   Post-operative Plan:   Informed Consent: I have reviewed the patients History and Physical, chart, labs and discussed the procedure including the risks, benefits and alternatives for the proposed anesthesia with the patient or authorized representative who has indicated his/her understanding and acceptance.   Dental advisory  given  Plan Discussed with: CRNA  Anesthesia Plan Comments:         Anesthesia Quick Evaluation

## 2015-02-09 NOTE — Anesthesia Procedure Notes (Addendum)
Procedure Name: MAC Date/Time: 02/09/2015 8:33 AM Performed by: Lowella Dell Pre-anesthesia Checklist: Patient identified, Emergency Drugs available, Suction available, Patient being monitored and Timeout performed Patient Re-evaluated:Patient Re-evaluated prior to inductionOxygen Delivery Method: Nasal cannula Intubation Type: IV induction Dental Injury: Teeth and Oropharynx as per pre-operative assessment

## 2015-02-10 ENCOUNTER — Encounter (HOSPITAL_COMMUNITY): Payer: Self-pay | Admitting: Gastroenterology

## 2015-02-10 DIAGNOSIS — E1122 Type 2 diabetes mellitus with diabetic chronic kidney disease: Secondary | ICD-10-CM

## 2015-02-10 DIAGNOSIS — I25810 Atherosclerosis of coronary artery bypass graft(s) without angina pectoris: Secondary | ICD-10-CM

## 2015-02-10 DIAGNOSIS — N189 Chronic kidney disease, unspecified: Secondary | ICD-10-CM

## 2015-02-10 LAB — CBC
HEMATOCRIT: 29.2 % — AB (ref 39.0–52.0)
Hemoglobin: 8.5 g/dL — ABNORMAL LOW (ref 13.0–17.0)
MCH: 21 pg — ABNORMAL LOW (ref 26.0–34.0)
MCHC: 29.1 g/dL — AB (ref 30.0–36.0)
MCV: 72.3 fL — AB (ref 78.0–100.0)
PLATELETS: 334 10*3/uL (ref 150–400)
RBC: 4.04 MIL/uL — ABNORMAL LOW (ref 4.22–5.81)
RDW: 19.8 % — AB (ref 11.5–15.5)
WBC: 8 10*3/uL (ref 4.0–10.5)

## 2015-02-10 LAB — BASIC METABOLIC PANEL
Anion gap: 7 (ref 5–15)
BUN: 29 mg/dL — ABNORMAL HIGH (ref 6–20)
CO2: 26 mmol/L (ref 22–32)
Calcium: 9.6 mg/dL (ref 8.9–10.3)
Chloride: 104 mmol/L (ref 101–111)
Creatinine, Ser: 1.24 mg/dL (ref 0.61–1.24)
GFR, EST NON AFRICAN AMERICAN: 56 mL/min — AB (ref >60)
Glucose, Bld: 229 mg/dL — ABNORMAL HIGH (ref 65–99)
POTASSIUM: 4 mmol/L (ref 3.5–5.1)
SODIUM: 137 mmol/L (ref 135–145)

## 2015-02-10 LAB — MAGNESIUM: MAGNESIUM: 1.9 mg/dL (ref 1.7–2.4)

## 2015-02-10 LAB — GLUCOSE, CAPILLARY
GLUCOSE-CAPILLARY: 257 mg/dL — AB (ref 65–99)
GLUCOSE-CAPILLARY: 204 mg/dL — AB (ref 65–99)
GLUCOSE-CAPILLARY: 208 mg/dL — AB (ref 65–99)
Glucose-Capillary: 181 mg/dL — ABNORMAL HIGH (ref 65–99)

## 2015-02-10 LAB — PROTIME-INR
INR: 1.67 — ABNORMAL HIGH (ref 0.00–1.49)
Prothrombin Time: 19.7 seconds — ABNORMAL HIGH (ref 11.6–15.2)

## 2015-02-10 LAB — HEPARIN LEVEL (UNFRACTIONATED): Heparin Unfractionated: 0.44 IU/mL (ref 0.30–0.70)

## 2015-02-10 MED ORDER — GLIMEPIRIDE 1 MG PO TABS
1.0000 mg | ORAL_TABLET | Freq: Two times a day (BID) | ORAL | Status: DC
  Administered 2015-02-10 – 2015-02-12 (×4): 1 mg via ORAL
  Filled 2015-02-10 (×4): qty 1

## 2015-02-10 MED ORDER — INSULIN GLARGINE 100 UNIT/ML ~~LOC~~ SOLN
5.0000 [IU] | Freq: Every day | SUBCUTANEOUS | Status: DC
  Administered 2015-02-10 – 2015-02-11 (×2): 5 [IU] via SUBCUTANEOUS
  Filled 2015-02-10 (×3): qty 0.05

## 2015-02-10 MED ORDER — WARFARIN SODIUM 7.5 MG PO TABS
7.5000 mg | ORAL_TABLET | Freq: Once | ORAL | Status: AC
  Administered 2015-02-10: 7.5 mg via ORAL
  Filled 2015-02-10: qty 1

## 2015-02-10 MED ORDER — CLOPIDOGREL BISULFATE 75 MG PO TABS
75.0000 mg | ORAL_TABLET | Freq: Every day | ORAL | Status: DC
  Administered 2015-02-11 – 2015-02-12 (×2): 75 mg via ORAL
  Filled 2015-02-10 (×2): qty 1

## 2015-02-10 NOTE — Progress Notes (Signed)
ANTICOAGULATION CONSULT NOTE - Follow Up Consult  Pharmacy Consult for Heparin Indication: mech AVR  No Known Allergies  Patient Measurements: Height: '5\' 9"'$  (175.3 cm) Weight: 205 lb 12.8 oz (93.35 kg) IBW/kg (Calculated) : 70.7 Heparin Dosing Weight: 87 kg  Vital Signs: Temp: 98.6 F (37 C) (06/24 0426) Temp Source: Oral (06/24 0426) BP: 109/70 mmHg (06/24 0426) Pulse Rate: 54 (06/24 0426)  Labs:  Recent Labs  02/08/15 0341 02/09/15 0506 02/10/15 0454  HGB 8.8* 8.7* 8.5*  HCT 30.2* 29.8* 29.2*  PLT 351 316 334  LABPROT 23.4* 19.8* 19.7*  INR 2.10* 1.68* 1.67*  HEPARINUNFRC 0.67 0.35 0.44  CREATININE 1.31* 1.23  --     Estimated Creatinine Clearance: 59.5 mL/min (by C-G formula based on Cr of 1.23).  Assessment: 47 YOM who presented with acute dyspnea and chest pain. Also reported mild cough, non-productive. Hgb 7.3. Acute hypoxic respiratory failure due to CHF. Pt on chronic Coumadin for mechanical AVR.   Anticoagulation: Mechanical aortic valve. INR goal 2.5-3.5 per Coumadin clinic. Admit INR 2.62. Last dose of Coumadin noted 6/18-put on hold hold until 6/23. Hgb 8.5 stable. Heparin level 0.44. INR 1.67 unchanged -Home Coumadin dose: 2.5 mg on Mon/Fri; 5 mg on all other days   Goal of Therapy:  Heparin level 0.3-0.7 units/ml  INR 2.5-3.5 Monitor platelets by anticoagulation protocol: Yes   Plan: -IV heparin at 1350 units/hr  -Coumadin 7.'5mg'$  po x 1 tonight. - Flagyl d/c'd -?Resume Plavix? -Daily PT/INR , HL, CBC     Myles Tavella S. Alford Highland, PharmD, BCPS Clinical Staff Pharmacist Pager [phone]  Eilene Ghazi Stillinger 02/10/2015,9:24 AM

## 2015-02-10 NOTE — Progress Notes (Signed)
Inpatient Diabetes Program Recommendations  AACE/ADA: New Consensus Statement on Inpatient Glycemic Control (2013)  Target Ranges:  Prepandial:   less than 140 mg/dL      Peak postprandial:   less than 180 mg/dL (1-2 hours)      Critically ill patients:  140 - 180 mg/dL  Results for Tanner Pena, Tanner Pena (MRN [id number]) as of 02/10/2015 08:27  Ref. Range 02/09/2015 09:50 02/09/2015 11:27 02/09/2015 16:23 02/09/2015 20:45 02/10/2015 07:49  Glucose-Capillary Latest Ref Range: 65-99 mg/dL 170 (H) 232 (H) 246 (H) 188 (H) 181 (H)   Reason for Admission: CHF exacerbation  Diabetes history: DM 2 Outpatient Diabetes medications: Amaryl '1mg'$  BID, Januvia 50 mg Daily Current orders for Inpatient glycemic control: Lantus 12 units QHS, Novolog 0-15 units TID, Novolog 0-5 units QHS  Inpatient Diabetes Program Recommendations Insulin - Basal: Glucose has increased from 130's into the 200's. Please consider increasing basal insulin slightly to Lantus 15 units QHS.  Thanks,  Tama Headings RN, MSN, [facility] Inpatient Diabetes Coordinator Team Pager [phone] (8AM-5PM)

## 2015-02-10 NOTE — Progress Notes (Signed)
No cardiac complaints. Coumadin resumed.

## 2015-02-10 NOTE — Progress Notes (Addendum)
[facility] PROGRESS NOTE  Tanner Pena KVQ:[id number] DOB: 16-May-1941 DOA: 02/04/2015 PCP: [name], MD  Brief narrative 74 year old male with history of CAD s/p CABG, chronic systolic and diastolic heart failure, s/p AVR withSt. 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 Patient was found to be in acute respiratory distress and placed on BiPAP. Chest x-ray showed pulmonary edema with significant leukocytosis and hemoglobin of 7.3.  Assessment/Plan Acute hypoxic respiratory failure Secondary to CHF exacerbation. euvolemic  Acute on chronic combined systolic and diastolic CHF euvolemic  Chest pain with elevated troponin Possibly demand ischemia. Required nitroglycerin drip upon admission. Remains chest pain-free. Troponin peaked at 1.59.   Iron deficiency anemia EGD ok.   Acute kidney injury Mild. Secondary to IV diuresis. Monitor.  ? Cecal colitis Fine off antibiotics  Diabetes mellitus type 2,  CBG is still running high. Will resume Amaryl.  History of aortic valve replacement  Resume heparin and warfarin  PVD: resume plavix  Code Status: DNR Family Communication: none at bedside today  Disposition Plan: home once INR closer to therapeutic   Consultants:  Cardiology  [facility]  Procedures:    Antibiotics:    HPI/Subjective: No bleeding, SOB, pain  Objective: Filed Vitals:   02/10/15 1044  BP: 134/58  Pulse: 61  Temp:   Resp:     Intake/Output Summary (Last 24 hours) at 02/10/15 1306 Last data filed at 02/10/15 0429  Gross per 24 hour  Intake    260 ml  Output    825 ml  Net   -565 ml   Filed Weights   02/08/15 1244 02/09/15 0532 02/10/15 0426   Weight: 88.451 kg (195 lb) 92.806 kg (204 lb 9.6 oz) 93.35 kg (205 lb 12.8 oz)    Exam:   General:  A and o. eating  Chest: Clear to auscultation bilaterally  CVS: Normal S1 and S2, systolic click, no murmurs  GI: Soft, nondistended, nontender, bowel sounds present  Musculoskeletal: Left BKA, edema resolved  CNS: Alert and oriented  Data Reviewed: Basic Metabolic Panel:  Recent Labs Lab 02/05/15 1134 02/06/15 1025 02/07/15 0536 02/08/15 0341 02/09/15 0506  NA 137 137 137 138 138  K 3.9 3.8 3.7 4.1 4.2  CL 103 102 100* 103 104  CO2 '25 27 26 26 26  '$ GLUCOSE 165* 221* 292* 168* 136*  BUN 28* 27* 36* 37* 31*  CREATININE 1.43* 1.32* 1.31* 1.31* 1.23  CALCIUM 9.4 9.6 9.5 9.3 9.7  MG  --  2.1  --   --   --    Liver Function Tests: No results for input(s): AST, ALT, ALKPHOS, BILITOT, PROT, ALBUMIN in the last 168 hours. No results for input(s): LIPASE, AMYLASE in the last 168 hours. No results for input(s): AMMONIA in the last 168 hours. CBC:  Recent Labs Lab 02/04/15 0415  02/06/15 1822 02/07/15 0536 02/08/15 0341 02/09/15 0506 02/10/15 0454  WBC 17.2*  < > 7.8 8.4 9.0 9.0 8.0  NEUTROABS 10.6*  --   --   --   --   --   --   HGB 7.3*  < > 8.5* 8.5* 8.8* 8.7* 8.5*  HCT 25.7*  < > 28.6* 28.7* 30.2* 29.8* 29.2*  MCV 72.0*  < > 72.2* 72.1* 72.4*  73.0* 72.3*  PLT 460*  < > 349 303 351 316 334  < > = values in this interval not displayed. Cardiac Enzymes:  Recent Labs Lab 02/04/15 0413 02/04/15 1033 02/04/15 1518 02/05/15 1430  TROPONINI 0.05* 0.68* 1.50* 1.09*   BNP (last 3 results)  Recent Labs  12/28/14 0134 02/04/15 0413  BNP 504.3* 593.2*    ProBNP (last 3 results)  Recent Labs  05/10/14 0229 05/11/14 1530  PROBNP 820.3* 1302.0*    CBG:  Recent Labs Lab 02/09/15 1127 02/09/15 1623 02/09/15 2045 02/10/15 0749 02/10/15 1202  GLUCAP 232* 246* 188* 181* 257*    Recent Results (from the past 240 hour(s))  MRSA PCR Screening      Status: None   Collection Time: 02/04/15 11:26 AM  Result Value Ref Range Status   MRSA by PCR NEGATIVE NEGATIVE Final    Comment:        The GeneXpert MRSA Assay (FDA approved for NASAL specimens only), is one component of a comprehensive MRSA colonization surveillance program. It is not intended to diagnose MRSA infection nor to guide or monitor treatment for MRSA infections.      Studies: No results found.  Scheduled Meds: . allopurinol  300 mg Oral QHS  . fenofibrate  160 mg Oral Daily  . furosemide  80 mg Oral Daily  . insulin aspart  0-15 Units Subcutaneous TID WC  . insulin aspart  0-5 Units Subcutaneous QHS  . insulin glargine  12 Units Subcutaneous QHS  . isosorbide mononitrate  30 mg Oral Daily  . metoprolol succinate  50 mg Oral Daily  . pantoprazole  40 mg Oral BID  . potassium chloride SA  20 mEq Oral BID  . pregabalin  50 mg Oral BID  . rosuvastatin  20 mg Oral q1800  . sucralfate  1 g Oral TID WC & HS  . warfarin  7.5 mg Oral ONCE-1800  . Warfarin - Pharmacist Dosing Inpatient   Does not apply q1800   Continuous Infusions: . heparin 1,350 Units/hr (02/10/15 1042)   Time spent: 15 minutes  [place] [facility]  [email or web address], password [facility] [place] 02/10/2015, 1:06 PM  LOS: 6 days

## 2015-02-11 LAB — CBC
HCT: 32.3 % — ABNORMAL LOW (ref 39.0–52.0)
Hemoglobin: 9.4 g/dL — ABNORMAL LOW (ref 13.0–17.0)
MCH: 21.3 pg — ABNORMAL LOW (ref 26.0–34.0)
MCHC: 29.1 g/dL — ABNORMAL LOW (ref 30.0–36.0)
MCV: 73.2 fL — ABNORMAL LOW (ref 78.0–100.0)
Platelets: 322 10*3/uL (ref 150–400)
RBC: 4.41 MIL/uL (ref 4.22–5.81)
RDW: 19.7 % — AB (ref 11.5–15.5)
WBC: 9.3 10*3/uL (ref 4.0–10.5)

## 2015-02-11 LAB — HEPARIN LEVEL (UNFRACTIONATED)
HEPARIN UNFRACTIONATED: 0.27 [IU]/mL — AB (ref 0.30–0.70)
Heparin Unfractionated: 0.28 IU/mL — ABNORMAL LOW (ref 0.30–0.70)

## 2015-02-11 LAB — GLUCOSE, CAPILLARY
GLUCOSE-CAPILLARY: 149 mg/dL — AB (ref 65–99)
GLUCOSE-CAPILLARY: 266 mg/dL — AB (ref 65–99)
Glucose-Capillary: 180 mg/dL — ABNORMAL HIGH (ref 65–99)
Glucose-Capillary: 190 mg/dL — ABNORMAL HIGH (ref 65–99)

## 2015-02-11 LAB — PROTIME-INR
INR: 1.88 — ABNORMAL HIGH (ref 0.00–1.49)
Prothrombin Time: 21.5 seconds — ABNORMAL HIGH (ref 11.6–15.2)

## 2015-02-11 MED ORDER — LEVALBUTEROL HCL 0.63 MG/3ML IN NEBU: 0.6300 mg | INHALATION_SOLUTION | Freq: Once | RESPIRATORY_TRACT | Status: DC

## 2015-02-11 MED ORDER — WARFARIN SODIUM 7.5 MG PO TABS
7.5000 mg | ORAL_TABLET | Freq: Once | ORAL | Status: AC
  Administered 2015-02-11: 7.5 mg via ORAL
  Filled 2015-02-11: qty 1

## 2015-02-11 MED ORDER — RAMIPRIL 2.5 MG PO CAPS
2.5000 mg | ORAL_CAPSULE | Freq: Every day | ORAL | Status: DC
  Administered 2015-02-11 – 2015-02-12 (×2): 2.5 mg via ORAL
  Filled 2015-02-11 (×2): qty 1

## 2015-02-11 MED ORDER — HEPARIN (PORCINE) IN NACL 100-0.45 UNIT/ML-% IJ SOLN
1600.0000 [IU]/h | INTRAMUSCULAR | Status: DC
  Administered 2015-02-11: 1450 [IU]/h via INTRAVENOUS
  Filled 2015-02-11 (×2): qty 250

## 2015-02-11 MED ORDER — FUROSEMIDE 10 MG/ML IJ SOLN
80.0000 mg | Freq: Once | INTRAMUSCULAR | Status: AC
  Administered 2015-02-11: 80 mg via INTRAVENOUS
  Filled 2015-02-11: qty 8

## 2015-02-11 NOTE — Progress Notes (Signed)
[facility] PROGRESS NOTE  Tanner Pena OJJ:[id number] DOB: 10/07/1940 DOA: 02/04/2015 PCP: [name], MD  Brief narrative 74 year old male with history of CAD s/p CABG, chronic systolic and diastolic heart failure, s/p AVR withSt. 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 Patient was found to be in acute respiratory distress and placed on BiPAP. Chest x-ray showed pulmonary edema with significant leukocytosis and hemoglobin of 7.3.  Assessment/Plan Acute hypoxic respiratory failure Complaining of slight shortness of breath today. Will give a nebulizer and an extra dose of Lasix today. Appears fairly comfortable.  Acute on chronic combined systolic and diastolic CHF Weight is up. See above  Chest pain with elevated troponin Possibly demand ischemia. Required nitroglycerin drip upon admission. Remains chest pain-free. Troponin peaked at 1.59.   Iron deficiency anemia EGD ok.   Acute kidney injury Mild. Secondary to IV diuresis. Monitor.  ? Cecal colitis Fine off antibiotics  Diabetes mellitus type 2,  CBG is still running high. Will resume Amaryl.  History of aortic valve replacement  Resume heparin and warfarin  PVD: resume plavix  Code Status: DNR Family Communication: none at bedside today  Disposition Plan: home once INR closer to therapeutic   Consultants:  Cardiology  [facility]  Procedures:    Antibiotics:    HPI/Subjective: Feels slightly short of breath. Denies orthopnea. Thinks inhaler might help.  Objective: Filed Vitals:   02/11/15 1027  BP: 152/78  Pulse: 58  Temp:   Resp:     Intake/Output Summary (Last 24 hours) at 02/11/15 1318 Last data  filed at 02/11/15 1153  Gross per 24 hour  Intake    340 ml  Output   1175 ml  Net   -835 ml   Filed Weights   02/09/15 0532 02/10/15 0426 02/11/15 0500  Weight: 92.806 kg (204 lb 9.6 oz) 93.35 kg (205 lb 12.8 oz) 93.078 kg (205 lb 3.2 oz)    Exam:   General:  A and o. Breathing nonlabored  Chest: Clear to auscultation bilaterally  CVS: Normal S1 and S2, systolic click, no murmurs  GI: Soft, nondistended, nontender, bowel sounds present  Musculoskeletal: Left BKA, edema resolved  CNS: Alert and oriented  Data Reviewed: Basic Metabolic Panel:  Recent Labs Lab 02/06/15 1025 02/07/15 0536 02/08/15 0341 02/09/15 0506 02/10/15 1517  NA 137 137 138 138 137  K 3.8 3.7 4.1 4.2 4.0  CL 102 100* 103 104 104  CO2 '27 26 26 26 26  '$ GLUCOSE 221* 292* 168* 136* 229*  BUN 27* 36* 37* 31* 29*  CREATININE 1.32* 1.31* 1.31* 1.23 1.24  CALCIUM 9.6 9.5 9.3 9.7 9.6  MG 2.1  --   --   --  1.9   Liver Function Tests: No results for input(s): AST, ALT, ALKPHOS, BILITOT, PROT, ALBUMIN in the last 168 hours. No results for input(s): LIPASE, AMYLASE in the last 168 hours. No results for input(s): AMMONIA in the last 168 hours. CBC:  Recent Labs Lab 02/07/15 0536 02/08/15 0341 02/09/15 0506 02/10/15 0454 02/11/15 0554  WBC 8.4 9.0 9.0 8.0 9.3  HGB 8.5* 8.8* 8.7* 8.5* 9.4*  HCT 28.7* 30.2* 29.8* 29.2* 32.3*  MCV 72.1* 72.4* 73.0* 72.3* 73.2*  PLT 303 351 316 334 322   Cardiac Enzymes:  Recent Labs Lab 02/04/15 1518 02/05/15 1430  TROPONINI 1.50* 1.09*   BNP (last 3 results)  Recent Labs  12/28/14 0134 02/04/15 0413  BNP 504.3* 593.2*    ProBNP (last 3 results)  Recent Labs  05/10/14 0229 05/11/14 1530  PROBNP 820.3* 1302.0*    CBG:  Recent Labs Lab 02/10/15 1202 02/10/15 1645 02/10/15 2105 02/11/15 0732 02/11/15 1150  GLUCAP 257* 204* 208* 149* 180*    Recent Results (from the past 240 hour(s))  MRSA PCR Screening     Status: None   Collection  Time: 02/04/15 11:26 AM  Result Value Ref Range Status   MRSA by PCR NEGATIVE NEGATIVE Final    Comment:        The GeneXpert MRSA Assay (FDA approved for NASAL specimens only), is one component of a comprehensive MRSA colonization surveillance program. It is not intended to diagnose MRSA infection nor to guide or monitor treatment for MRSA infections.      Studies: No results found.  Scheduled Meds: . allopurinol  300 mg Oral QHS  . clopidogrel  75 mg Oral Q breakfast  . fenofibrate  160 mg Oral Daily  . furosemide  80 mg Intravenous Once  . furosemide  80 mg Oral Daily  . glimepiride  1 mg Oral BID  . insulin aspart  0-15 Units Subcutaneous TID WC  . insulin aspart  0-5 Units Subcutaneous QHS  . insulin glargine  5 Units Subcutaneous QHS  . isosorbide mononitrate  30 mg Oral Daily  . levalbuterol  0.63 mg Nebulization Once  . metoprolol succinate  50 mg Oral Daily  . pantoprazole  40 mg Oral BID  . potassium chloride SA  20 mEq Oral BID  . pregabalin  50 mg Oral BID  . ramipril  2.5 mg Oral Daily  . rosuvastatin  20 mg Oral q1800  . sucralfate  1 g Oral TID WC & HS  . warfarin  7.5 mg Oral ONCE-1800  . Warfarin - Pharmacist Dosing Inpatient   Does not apply q1800   Continuous Infusions: . heparin 1,450 Units/hr (02/11/15 1047)   Time spent: 15 minutes  [facility]  [email or web address], password [facility] 02/11/2015, 1:18 PM  LOS: 7 days

## 2015-02-11 NOTE — Progress Notes (Signed)
ANTICOAGULATION CONSULT NOTE - Follow Up Consult  Pharmacy Consult for Heparin Indication: mech AVR  No Known Allergies  Patient Measurements: Height: '5\' 8"'$  (172.7 cm) Weight: 205 lb 3.2 oz (93.078 kg) IBW/kg (Calculated) : 68.4 Heparin Dosing Weight: 87 kg  Vital Signs: BP: 133/50 mmHg (06/25 1429) Pulse Rate: 58 (06/25 1027)  Labs:  Recent Labs  02/09/15 0506 02/10/15 0454 02/10/15 1517 02/11/15 0554 02/11/15 1619  HGB 8.7* 8.5*  --  9.4*  --   HCT 29.8* 29.2*  --  32.3*  --   PLT 316 334  --  322  --   LABPROT 19.8* 19.7*  --  21.5*  --   INR 1.68* 1.67*  --  1.88*  --   HEPARINUNFRC 0.35 0.44  --  0.28* 0.27*  CREATININE 1.23  --  1.24  --   --     Estimated Creatinine Clearance: 57.9 mL/min (by C-G formula based on Cr of 1.24).  Assessment: Tanner Pena who presented on 02/04/2015 with acute dyspnea and chest pain. Acute hypoxic respiratory failure due to CHF. Pt on chronic Coumadin for mechanical AVR. Admit INR 2.62. Last dose of Coumadin noted 6/18-put on hold hold until 6/23.   Heparin level remains low at 0.27 despite rate increase. No interruptions except for a few minutes while giving another medication this afternoon. No bleeding noted.   Goal of Therapy:  Heparin level 0.3-0.7 units/ml  Monitor platelets by anticoagulation protocol: Yes   Plan: - Increase heparin gtt to 1600 units/hr - Check an 8 hour HL   Salome Arnt, PharmD, BCPS Pager # [phone] 02/11/2015 5:21 PM

## 2015-02-11 NOTE — Progress Notes (Signed)
Cardiology to see as needed this weekend Please call with questions  Thompson Grayer MD, [facility] 02/11/2015 10:09 AM

## 2015-02-11 NOTE — Progress Notes (Signed)
ANTICOAGULATION CONSULT NOTE - Follow Up Consult  Pharmacy Consult for Heparin Indication: mech AVR  No Known Allergies  Patient Measurements: Height: '5\' 8"'$  (172.7 cm) Weight: 205 lb 3.2 oz (93.078 kg) IBW/kg (Calculated) : 68.4 Heparin Dosing Weight: 87 kg  Vital Signs: Temp: 98.6 F (37 C) (06/25 0500) Temp Source: Oral (06/25 0500) BP: 134/54 mmHg (06/25 0500) Pulse Rate: 53 (06/25 0500)  Labs:  Recent Labs  02/09/15 0506 02/10/15 0454 02/10/15 1517 02/11/15 0554  HGB 8.7* 8.5*  --  9.4*  HCT 29.8* 29.2*  --  32.3*  PLT 316 334  --  322  LABPROT 19.8* 19.7*  --  21.5*  INR 1.68* 1.67*  --  1.88*  HEPARINUNFRC 0.35 0.44  --  0.28*  CREATININE 1.23  --  1.24  --     Estimated Creatinine Clearance: 57.9 mL/min (by C-G formula based on Cr of 1.24).  Assessment: 10 YOM who presented on 02/04/2015 with acute dyspnea and chest pain. Acute hypoxic respiratory failure due to CHF. Pt on chronic Coumadin for mechanical AVR. Admit INR 2.62. Last dose of Coumadin noted 6/18-put on hold hold until 6/23.   Heparin level this am has trended down to below goal at 0.28. No interruptions in gtt per nurse. INR still below goal but trending up to 1.88. Hgb remains low but has trended up to 9.4, plt wnl and stable. No bleeding noted.    Home Coumadin dose: 2.5 mg on Mon/Fri; 5 mg on all other days   Goal of Therapy:  Heparin level 0.3-0.7 units/ml  INR 2.5-3.5 per Coumadin clinic Monitor platelets by anticoagulation protocol: Yes   Plan: - Increase heparin gtt to 1450 units/hr - 8 hour HL  - Coumadin 7.'5mg'$  po x 1 tonight - Daily PT/INR , HL, CBC   Rickita Forstner K. Velva Harman, PharmD, [place] Clinical Pharmacist - Resident Pager: [phone] Pharmacy: [phone] 02/11/2015 8:05 AM

## 2015-02-12 LAB — BASIC METABOLIC PANEL
Anion gap: 10 (ref 5–15)
BUN: 49 mg/dL — ABNORMAL HIGH (ref 6–20)
CO2: 27 mmol/L (ref 22–32)
CREATININE: 1.81 mg/dL — AB (ref 0.61–1.24)
Calcium: 9.5 mg/dL (ref 8.9–10.3)
Chloride: 99 mmol/L — ABNORMAL LOW (ref 101–111)
GFR calc Af Amer: 41 mL/min — ABNORMAL LOW (ref >60)
GFR calc non Af Amer: 35 mL/min — ABNORMAL LOW (ref >60)
Glucose, Bld: 178 mg/dL — ABNORMAL HIGH (ref 65–99)
POTASSIUM: 4.6 mmol/L (ref 3.5–5.1)
SODIUM: 136 mmol/L (ref 135–145)

## 2015-02-12 LAB — HEPARIN LEVEL (UNFRACTIONATED): HEPARIN UNFRACTIONATED: 0.45 [IU]/mL (ref 0.30–0.70)

## 2015-02-12 LAB — CBC
HCT: 30 % — ABNORMAL LOW (ref 39.0–52.0)
Hemoglobin: 8.8 g/dL — ABNORMAL LOW (ref 13.0–17.0)
MCH: 21.2 pg — ABNORMAL LOW (ref 26.0–34.0)
MCHC: 29.3 g/dL — AB (ref 30.0–36.0)
MCV: 72.3 fL — ABNORMAL LOW (ref 78.0–100.0)
Platelets: 330 10*3/uL (ref 150–400)
RBC: 4.15 MIL/uL — ABNORMAL LOW (ref 4.22–5.81)
RDW: 19.6 % — ABNORMAL HIGH (ref 11.5–15.5)
WBC: 10.2 10*3/uL (ref 4.0–10.5)

## 2015-02-12 LAB — PROTIME-INR
INR: 2.66 — AB (ref 0.00–1.49)
PROTHROMBIN TIME: 28 s — AB (ref 11.6–15.2)

## 2015-02-12 LAB — GLUCOSE, CAPILLARY: Glucose-Capillary: 159 mg/dL — ABNORMAL HIGH (ref 65–99)

## 2015-02-12 MED ORDER — FERROUS SULFATE 325 (65 FE) MG PO TABS
325.0000 mg | ORAL_TABLET | Freq: Every day | ORAL | Status: DC
Start: 2015-02-12 — End: 2015-05-15

## 2015-02-12 MED ORDER — WARFARIN SODIUM 2.5 MG PO TABS: 2.5000 mg | ORAL_TABLET | Freq: Once | ORAL | Status: DC

## 2015-02-12 MED ORDER — FUROSEMIDE 80 MG PO TABS: 80.0000 mg | ORAL_TABLET | Freq: Every day | ORAL | Status: DC

## 2015-02-12 NOTE — Discharge Summary (Signed)
Physician Discharge Summary  Tanner Pena DJS:[id number] DOB: [birth year] DOA: 02/04/2015  PCP: [name], MD  Admit date: 02/04/2015 Discharge date: 02/12/2015  Time spent: greater than 30 minutes  Recommendations for Outpatient Follow-up:  1. Monitor h/h 2. Monitor BMET, weights  Discharge Diagnoses:  Primary problem   Systolic CHF, acute on chronic  Active Problems:   H/O aortic valve replacement- St Jude   CAD - CABG '90 with re do '94    PVD prior SFA PTA with chronic LE disease, not amenable to PTA   DM2 (diabetes mellitus, type 2)   Cardiomyopathy, ischemic   COPD (chronic obstructive pulmonary disease)   LBBB (left bundle branch block)   S/P below knee amputation   Acute respiratory failure with hypoxia   Iron deficiency anemia   NSTEMI (non-ST elevated myocardial infarction)  Discharge Condition: stable  Diet recommendation: heart healthy diabetic  Filed Weights   02/10/15 0426 02/11/15 0500 02/12/15 0500  Weight: 93.35 kg (205 lb 12.8 oz) 93.078 kg (205 lb 3.2 oz) 91.4 kg (201 lb 8 oz)    History of present illness/Hospital Course:  74 year old male with history of CAD s/p CABG, chronic systolic and diastolic heart failure, s/p AVR withSt. 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 Patient was found to be in acute respiratory distress and placed on BiPAP. Chest x-ray showed pulmonary edema with significant leukocytosis and hemoglobin of 7.3.  Assessment/Plan Acute hypoxic respiratory failure Secondary to CHF exacerbation. Diuresed well with IV Lasix. Negative balance of 6.4 L since admission. On room air, clear lung sounds by discharge  Acute on chronic combined systolic and  diastolic CHF echo with EF 40%. Symptoms also worsened with underlying anemia. cardiology consulted. Diabetes mellitus with IV Lasix Good diuresis with net negative of >10L. Will be discharge on lasix 80 mg daily (previously, 40 mg)  Chest pain with elevated troponin Possibly demand ischemia. Required nitroglycerin drip upon admission. Remains chest pain-free. Troponin peaked at 1.59.  Continue metoprolol, statin Imdur. Myoview in 12/14 without ischemia. PCI of SVG to right coronary artery in 9/15. Cardiology consulted  Iron deficiency anemia Baseline hemoglobin 5 months back of 11-12 which has progressively dropped to 7 this admission. Iron panel suggest iron deficiency. Hemoccults negative. Still concerning fro GI bleed with abdominal pain. Transfused 1U prbc on 6/19, hb stable a t 8. GI consulted and performed EGD, which was unremarkable. They rec f/u with GI in 3 weeks. May consider capsule endo if continues, but low yield without active bleeding  Acute kidney injury Mild. Secondary to IV diuresis.   ? Cecal colitis Abdominal CT done given persistent abdominal discomfort and showed a possible inflammation of the cecum. Placed on antibiotics for several days, but has been without symptoms, so this was stopped  Diabetes mellitus type 2,  Continue outpt medications  History of aortic valve replacement  Coumadin held for procedure, then resumed with heparin bridge. Now therapeutic.  Peripheral vascular disease prior SFA PTA with chronic LE disease. Resumed plavix  Procedures:  EGD  Consultations:  Cardiology  GI  Discharge Exam: Filed Vitals:   02/12/15 0500  BP: 129/41  Pulse: 50  Temp: 98.1 F (36.7 C)  Resp: 13    General: a and o Cardiovascular: RRR with  mechanical click Respiratory: RRR without MGR Ext no edema.   Discharge Instructions   Discharge Instructions    Diet - low sodium heart healthy    Complete by:  As directed      Diet Carb Modified     Complete by:  As directed      Discharge instructions    Complete by:  As directed   Monitor daily weights. Call your cardiologist if increases by 5 lbs over a week     Discharge instructions    Complete by:  As directed   Increase lasix to 80 mg daily (whole pill)     Increase activity slowly    Complete by:  As directed           Current Discharge Medication List    START taking these medications   Details  ferrous sulfate 325 (65 FE) MG tablet Take 1 tablet (325 mg total) by mouth daily with breakfast. Qty: 30 tablet, Refills: 1      CONTINUE these medications which have CHANGED   Details  furosemide (LASIX) 80 MG tablet Take 1 tablet (80 mg total) by mouth daily.      CONTINUE these medications which have NOT CHANGED   Details  albuterol (PROVENTIL) 4 MG tablet Take 4 mg by mouth 3 (three) times daily.    albuterol (VOSPIRE ER) 4 MG 12 hr tablet Take 4 mg by mouth 2 (two) times daily.     albuterol-ipratropium (COMBIVENT) 18-103 MCG/ACT inhaler Inhale 1 puff into the lungs 4 (four) times daily. Coughing/ Shortness of Breath    allopurinol (ZYLOPRIM) 300 MG tablet Take 300 mg by mouth at bedtime.     Ascorbic Acid (VITAMIN C WITH ROSE HIPS) 1000 MG tablet Take 1,000 mg by mouth daily.    clopidogrel (PLAVIX) 75 MG tablet Take 1 tablet (75 mg total) by mouth daily with breakfast. Qty: 30 tablet, Refills: 11    CRESTOR 20 MG tablet TAKE (1) TABLET BY MOUTH AT BEDTIME FOR CHOLESTEROL. Qty: 30 tablet, Refills: 3    Emollient (EUCERIN) lotion Apply 10 mLs topically as needed for dry skin.    fenofibrate (TRICOR) 145 MG tablet TAKE 1 TABLET BY MOUTH ONCE DAILY FOR CHOLESTEROL. Qty: 30 tablet, Refills: 3    fish oil-omega-3 fatty acids 1000 MG capsule Take 1 capsule (1 g total) by mouth 2 (two) times daily.    glimepiride (AMARYL) 1 MG tablet Take 1 mg by mouth 2 (two) times daily.    isosorbide mononitrate (IMDUR) 30 MG 24 hr tablet TAKE ONE TABLET BY MOUTH ONCE  DAILY. Qty: 30 tablet, Refills: 5    metoprolol succinate (TOPROL-XL) 50 MG 24 hr tablet Take 1 tablet (50 mg total) by mouth daily. Take with or immediately following a meal. Qty: 30 tablet, Refills: 11    nitroGLYCERIN (NITROSTAT) 0.4 MG SL tablet Place 1 tablet (0.4 mg total) under the tongue every 5 (five) minutes as needed for chest pain. Qty: 25 tablet, Refills: 12    oxyCODONE-acetaminophen (PERCOCET) 10-325 MG per tablet Take 1 tablet by mouth every 4 (four) hours as needed for pain. Qty: 30 tablet, Refills: 0    potassium chloride SA (K-DUR,KLOR-CON) 20 MEQ tablet Take 20 mEq by mouth daily.      pregabalin (LYRICA) 50 MG capsule Take one capsule by mouth twice daily for pains Qty: 60 capsule, Refills: 5    ramipril (ALTACE) 2.5 MG capsule TAKE 1 CAPSULE BY MOUTH AT BEDTIME. Qty: 30 capsule,  Refills: 7    sitaGLIPtin (JANUVIA) 50 MG tablet Take 50 mg by mouth daily.    warfarin (COUMADIN) 5 MG tablet Take 1-1.5 tablets (5-7.5 mg total) by mouth at bedtime. Takes one-half tablet (7.'5mg'$  total) on Fridays. Takes one tablet ('5mg'$  total) on all other days Qty: 30 tablet, Refills: 11    zinc gluconate 50 MG tablet Take 50 mg by mouth daily.    ACCU-CHEK AVIVA PLUS test strip     cholecalciferol (VITAMIN D) 1000 UNITS tablet Take 2,000 Units by mouth daily.       No Known Allergies Follow-up Information    Follow up with Cassell Clement, MD. Schedule an appointment as soon as possible for a visit in 3 weeks.   Specialty:  Gastroenterology   Why:  for anemia   Contact information:   [address] [place] [address] [phone]       Follow up with [name], MD. Schedule an appointment as soon as possible for a visit in 1 week.   Specialty:  Internal Medicine   Why:  to check hemoglobin, creatinine   Contact information:   [address] [place] [address] [phone]       Follow up with Quay Burow, MD. Schedule an appointment  as soon as possible for a visit in 3 weeks.   Specialties:  Cardiology, Radiology   Contact information:   [address] [place] [address] [phone]        The results of significant diagnostics from this hospitalization (including imaging, microbiology, ancillary and laboratory) are listed below for reference.    Significant Diagnostic Studies: Ct Abdomen Pelvis Wo Contrast  02/05/2015   CLINICAL DATA:  Patient with epigastric and generalized abdominal pain for 2 weeks.  EXAM: CT ABDOMEN AND PELVIS WITHOUT CONTRAST  TECHNIQUE: Multidetector CT imaging of the abdomen and pelvis was performed following the standard protocol without IV contrast.  COMPARISON:  CT abdomen pelvis 04/22/2012  FINDINGS: Lower chest: Re- demonstrated pleural-based calcifications within the left lower hemi thorax. Normal heart size. Unchanged probable scarring left lower lobe.  Hepatobiliary: Liver is normal in size and contour without focal hepatic lesion identified. Gallbladder is unremarkable. No intrahepatic or extrahepatic biliary ductal dilatation.  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 Right adrenal gland is unremarkable. Re- demonstrated focal atrophy of the inferior pole of the right kidney. 3 mm nonobstructing stone superior pole right kidney. Stable bilateral perinephric fat stranding. Urinary bladder is unremarkable. No ureterolithiasis. No hydronephrosis.  Stomach/Bowel: Small fat containing periumbilical hernia. There is mild wall thickening of the cecum. No significant surrounding fat stranding. Normal appendix. No evidence for bowel obstruction. Descending and sigmoid colonic diverticulosis without evidence for acute diverticulitis. No free fluid or free intraperitoneal air.  Vascular/Lymphatic: Extensive calcification of the abdominal aorta. No retroperitoneal lymphadenopathy.  Other: Fat containing periumbilical  hernia.  Musculoskeletal: Lower lumbar spine degenerative changes. No aggressive or acute appearing osseous lesions.  IMPRESSION: Wall thickening of the cecum without significant surrounding inflammatory stranding. Findings may represent combination of underdistention and or possible focal colitis. Recommend correlation with colonoscopy in the nonacute setting to exclude the possibility of underlying mass.   Electronically Signed   By: Lovey Newcomer M.D.   On: 02/05/2015 22:04   Dg Chest Portable 1 View  02/04/2015   CLINICAL DATA:  Shortness of breath.  EXAM: PORTABLE CHEST - 1 VIEW  COMPARISON:  12/28/2014  FINDINGS: Postoperative changes in the mediastinum. Cardiac enlargement with pulmonary vascular congestion and perihilar infiltrates suggesting edema. Linear atelectasis in the left mid lung. No definite pleural effusion although the entire costophrenic angles are not included within the field of view. No pneumothorax. Old resection or resorption of the distal right clavicle.  IMPRESSION: Cardiac enlargement with pulmonary vascular congestion and perihilar edema.   Electronically Signed   By: Lucienne Capers M.D.   On: 02/04/2015 04:33   Echo Left ventricle: Inferior, septal and apical hypokinesis. The cavity size was mildly dilated. Wall thickness was increased in a pattern of moderate LVH. The estimated ejection fraction was 40%. - Aortic valve: Mechanical AVR with no periporsthetic regurgitation. Gradients elevated but actually lower than 2015 when peak velocity was 3.5 m/sec and peak gradient was 49 mmHg. Valve area (VTI): 0.88 cm^2. Valve area (Vmax): 0.63 cm^2. Valve area (Vmean): 0.94 cm^2. - Left atrium: The atrium was moderately dilated. - Atrial septum: There was increased thickness of the septum, consistent with lipomatous hypertrophy.  Microbiology: Recent Results (from the past 240 hour(s))  MRSA PCR Screening     Status: None   Collection Time: 02/04/15 11:26 AM   Result Value Ref Range Status   MRSA by PCR NEGATIVE NEGATIVE Final    Comment:        The GeneXpert MRSA Assay (FDA approved for NASAL specimens only), is one component of a comprehensive MRSA colonization surveillance program. It is not intended to diagnose MRSA infection nor to guide or monitor treatment for MRSA infections.      Labs: Basic Metabolic Panel:  Recent Labs Lab 02/06/15 1025 02/07/15 0536 02/08/15 0341 02/09/15 0506 02/10/15 1517 02/12/15 0130  NA 137 137 138 138 137 136  K 3.8 3.7 4.1 4.2 4.0 4.6  CL 102 100* 103 104 104 99*  CO2 '27 26 26 26 26 27  '$ GLUCOSE 221* 292* 168* 136* 229* 178*  BUN 27* 36* 37* 31* 29* 49*  CREATININE 1.32* 1.31* 1.31* 1.23 1.24 1.81*  CALCIUM 9.6 9.5 9.3 9.7 9.6 9.5  MG 2.1  --   --   --  1.9  --    Liver Function Tests: No results for input(s): AST, ALT, ALKPHOS, BILITOT, PROT, ALBUMIN in the last 168 hours. No results for input(s): LIPASE, AMYLASE in the last 168 hours. No results for input(s): AMMONIA in the last 168 hours. CBC:  Recent Labs Lab 02/08/15 0341 02/09/15 0506 02/10/15 0454 02/11/15 0554 02/12/15 0130  WBC 9.0 9.0 8.0 9.3 10.2  HGB 8.8* 8.7* 8.5* 9.4* 8.8*  HCT 30.2* 29.8* 29.2* 32.3* 30.0*  MCV 72.4* 73.0* 72.3* 73.2* 72.3*  PLT 351 316 334 322 330   Cardiac Enzymes:  Recent Labs Lab 02/05/15 1430  TROPONINI 1.09*   BNP: BNP (last 3 results)  Recent Labs  12/28/14 0134 02/04/15 0413  BNP 504.3* 593.2*    ProBNP (last 3 results)  Recent Labs  05/10/14 0229 05/11/14 1530  PROBNP 820.3* 1302.0*    CBG:  Recent Labs Lab 02/11/15 0732 02/11/15 1150 02/11/15 1702 02/11/15 2058 02/12/15 0745  GLUCAP 149* 180* 266* 190* 159*       Signed:  Vuk Skillern L  [facility] 02/12/2015, 10:33 AM

## 2015-02-12 NOTE — Progress Notes (Signed)
ANTICOAGULATION CONSULT NOTE - Follow Up Consult  Pharmacy Consult for Heparin/Coumadin Indication: mech AVR  No Known Allergies  Patient Measurements: Height: '5\' 8"'$  (172.7 cm) Weight: 201 lb 8 oz (91.4 kg) IBW/kg (Calculated) : 68.4 Heparin Dosing Weight: 87 kg  Vital Signs: Temp: 98.1 F (36.7 C) (06/26 0500) BP: 129/41 mmHg (06/26 0500) Pulse Rate: 50 (06/26 0500)  Labs:  Recent Labs  02/10/15 0454 02/10/15 1517 02/11/15 0554 02/11/15 1619 02/12/15 0130  HGB 8.5*  --  9.4*  --  8.8*  HCT 29.2*  --  32.3*  --  30.0*  PLT 334  --  322  --  330  LABPROT 19.7*  --  21.5*  --  28.0*  INR 1.67*  --  1.88*  --  2.66*  HEPARINUNFRC 0.44  --  0.28* 0.27* 0.45  CREATININE  --  1.24  --   --  1.81*    Estimated Creatinine Clearance: 39.3 mL/min (by C-G formula based on Cr of 1.81).  Assessment: 53 YOM who presented on 02/04/2015 with acute dyspnea and chest pain. Acute hypoxic respiratory failure due to CHF. Pt on chronic Coumadin for mechanical AVR. Admit INR 2.62. Last dose of Coumadin noted 6/18, put on hold hold until 6/23.   Heparin level this am remains therapeutic at 0.45. INR has now trended up significantly to therapeutic level at 2.66 after 3 days of increased doses. Hgb remains low but is relatively stable at 8.8, plt wnl and stable. No bleeding noted.    Home Coumadin dose: 2.5 mg on Mon/Fri; 5 mg on all other days   Goal of Therapy:  Heparin level 0.3-0.7 units/ml  INR 2.5-3.5 per Coumadin clinic Monitor platelets by anticoagulation protocol: Yes   Plan: - Discontinue heparin gtt at INR >2.5 - Coumadin 2.5 mg po x 1 tonight (lower dose with sharp rise in INR) - Daily PT/INR , CBC  - Consider iron repletion with tsat 2%, ferritin 6, TIBC 732  Sukari Grist K. Velva Harman, PharmD, [place] Clinical Pharmacist - Resident Pager: [phone] Pharmacy: [phone] 02/12/2015 7:43 AM

## 2015-02-12 NOTE — Progress Notes (Signed)
ANTICOAGULATION CONSULT NOTE Pharmacy Consult for Heparin Indication: mech AVR  No Known Allergies  Patient Measurements: Height: '5\' 8"'$  (172.7 cm) Weight: 205 lb 3.2 oz (93.078 kg) IBW/kg (Calculated) : 68.4 Heparin Dosing Weight: 87 kg  Vital Signs: Temp: 98.6 F (37 C) (06/25 2100) BP: 112/75 mmHg (06/25 2100) Pulse Rate: 58 (06/25 2100)  Labs:  Recent Labs  02/09/15 0506 02/10/15 0454 02/10/15 1517 02/11/15 0554 02/11/15 1619 02/12/15 0130  HGB 8.7* 8.5*  --  9.4*  --  8.8*  HCT 29.8* 29.2*  --  32.3*  --  30.0*  PLT 316 334  --  322  --  330  LABPROT 19.8* 19.7*  --  21.5*  --  28.0*  INR 1.68* 1.67*  --  1.88*  --  2.66*  HEPARINUNFRC 0.35 0.44  --  0.28* 0.27* 0.45  CREATININE 1.23  --  1.24  --   --  1.81*    Estimated Creatinine Clearance: 39.7 mL/min (by C-G formula based on Cr of 1.81).  Assessment: 74 y.o. male with h/o AVR for heparin  Goal of Therapy:  Heparin level 0.3-0.7 units/ml  Monitor platelets by anticoagulation protocol: Yes   Plan: Continue Heparin at current rate for now D/C heparin with INR > 2.5?  Phillis Knack, PharmD, BCPS  -02/12/2015 2:33 AM

## 2015-02-12 NOTE — Progress Notes (Signed)
Pt discharge to home via [place], condition stable, accompanied by significant other.

## 2015-02-13 ENCOUNTER — Ambulatory Visit: Payer: Medicare Other | Admitting: Gastroenterology

## 2015-02-13 ENCOUNTER — Encounter: Payer: Self-pay | Admitting: *Deleted

## 2015-02-15 ENCOUNTER — Encounter: Payer: Self-pay | Admitting: Internal Medicine

## 2015-02-15 DIAGNOSIS — Z1389 Encounter for screening for other disorder: Secondary | ICD-10-CM | POA: Diagnosis not present

## 2015-02-15 DIAGNOSIS — G894 Chronic pain syndrome: Secondary | ICD-10-CM | POA: Diagnosis not present

## 2015-02-15 DIAGNOSIS — E6609 Other obesity due to excess calories: Secondary | ICD-10-CM | POA: Diagnosis not present

## 2015-02-15 DIAGNOSIS — Z6831 Body mass index (BMI) 31.0-31.9, adult: Secondary | ICD-10-CM | POA: Diagnosis not present

## 2015-02-15 DIAGNOSIS — R1013 Epigastric pain: Secondary | ICD-10-CM | POA: Diagnosis not present

## 2015-02-16 ENCOUNTER — Encounter: Payer: Self-pay | Admitting: Nurse Practitioner

## 2015-02-16 ENCOUNTER — Ambulatory Visit (INDEPENDENT_AMBULATORY_CARE_PROVIDER_SITE_OTHER): Payer: Medicare Other | Admitting: Nurse Practitioner

## 2015-02-16 VITALS — BP 110/60 | HR 72 | Temp 98.3°F | Ht 70.0 in

## 2015-02-16 DIAGNOSIS — R935 Abnormal findings on diagnostic imaging of other abdominal regions, including retroperitoneum: Secondary | ICD-10-CM | POA: Diagnosis not present

## 2015-02-16 DIAGNOSIS — R1084 Generalized abdominal pain: Secondary | ICD-10-CM

## 2015-02-16 NOTE — Progress Notes (Signed)
Referring Provider: Redmond School, MD Primary Care Physician:  [name], MD Primary GI:  Dr. [name]  Chief Complaint  Patient presents with  . Abdominal Pain    HPI:   74 year old male with a chief complaint of referral from [facility] for abdominal pain. He just had a EGD on 02/09/2015 with Dr. Michail Sermon at [facility] under monitored anesthesia care. Endoscopic impression was normal EGD, consider outpatient capsule Endo if anemia worsens versus hematology consult. Patient was admitted on 02/04/2015 for respiratory failure and acute on chronic systolic and diastolic heart failure. During his admission he was noted to be anemic at which point the endoscopy was completed. CT of the abdomen and pelvis without contrast completed 02/05/2015 found wall thickening of the cecum without significant surrounding inflammation which may resent combination of underdistention and/or possible focal colitis. Recommended correlation with colonoscopy and a nonacute setting to exclude possibility of underlying mass. The recommended follow-up with GI.  Approximately 3 years ago patient was worked up for ascending colon ulcers abnormal CT angiogram consistent with mesenteric ischemia. The patient has peripheral artery disease but at time he was asymptomatic from a GI standpoint and Dr. Alvester Chou was consulted by Dr. [name]. At the time due to his asymptomatic presentation was felt conservative measures were appropriate but possible further workup and intervention if he became symptomatic.  Today he states his stomach is still hurting. Symptoms have been worse in the past 3-4 weeks. Pain is generalized and dull. Not too bad right now. Is also having N/V. Nausea is "not very often." Last episode of emesis was a few days ago after taking his iron pill. Denies hematochezia. Unsure if he's had melena. Denies hematemesis. Denies chest pain, dyspnea worse then normal. Admits occasional dizziness, denies syncope and near  syncope. Has a bowel movement daily which are consistent with [facility] 4, no straining. Denies chest pain, dyspnea, dizziness, lightheadedness, syncope, near syncope. Admits subjective weight loss of 20 pounds in the last several months. Denies pain worse with eating. Denies any other upper or lower GI symptoms.  Past Medical History  Diagnosis Date  . Diabetes mellitus 2007  . Hypertension   . Gout   . Hypercholesteremia   . Peripheral vascular disease     stents lower extremity  . COPD (chronic obstructive pulmonary disease)   . S/P aortic valve replacement 1990    a. [employer]  . Chronic back pain   . Dysphagia   . Neuromuscular disorder   . Peripheral neuropathy   . Critical lower limb ischemia   . CAD (coronary artery disease)     a. 05/13/14 [name] s/p overlapping DESx2 to SVG to RCA. b.  s/p CABG '90 with redo '94 & stent to RCA SVG in 2005  . Chronic toe ulcer     a. left foot  . Shortness of breath   . Myocardial infarction   . CHF (congestive heart failure)   . Stone in kidney   . GERD (gastroesophageal reflux disease)   . Arthritis   . Sleep apnea     tested greater than 7 years ago per patient    Past Surgical History  Procedure Laterality Date  . Open heart surgery  1990    prosthetic heart valve, one bypass  . Rotator cuff repair      right  . Cataract extraction      bilateral  . Coronary stent placement  2005    RCA vein graft A 3.0x13.0 TAXUS stent was  then placed int he vessel a Viva 3.0x4.0 (perfusion balloon was made ready it was placed through the entire lenght of the stent  . Peripheral vascular procedures lower extremities      right external iliac  artery PTA and stenting as well as bilateral SFA intervention remotely. Repeat procedures in 2011 bilaterally  . Coronary artery bypass graft  1994    6 vessels  . [name]  06/13/2011    Procedure: Venia Minks DILATION;  Surgeon: [name], MD;  Location: [facility];  Service: Endoscopy;  Laterality:  N/A;  Dilated to 56.   . Angioplasty illiac artery    . Back surgery  2725,[id number]    2  . Eye surgery    . Amputation Left 07/13/2014    Procedure: Transmetatarsal Amputation;  Surgeon: [name], MD;  Location: [place];  Service: Orthopedics;  Laterality: Left;  . Lower extremity angiogram N/A 02/15/2013    Procedure: LOWER EXTREMITY ANGIOGRAM;  Surgeon: [name], MD;  Location: [facility] CATH LAB;  Service: Cardiovascular;  Laterality: N/A;  . Left heart catheterization with coronary angiogram N/A 05/11/2014    Procedure: LEFT HEART CATHETERIZATION WITH CORONARY ANGIOGRAM;  Surgeon: [name], MD;  Location: [facility] CATH LAB;  Service: Cardiovascular;  Laterality: N/A;  . Percutaneous coronary stent intervention (pci-s) N/A 05/13/2014    Procedure: PERCUTANEOUS CORONARY STENT INTERVENTION (PCI-S);  Surgeon: [name], MD;  Location: [facility] CATH LAB;  Service: Cardiovascular;  Laterality: N/A;  . Lower extremity angiogram N/A 06/06/2014    Procedure: LOWER EXTREMITY ANGIOGRAM;  Surgeon: [name], MD;  Location: [facility] CATH LAB;  Service: Cardiovascular;  Laterality: N/A;  . Amputation Left 08/20/2014    Procedure: Revision Transmetatarsal Amputation versus Below Knee Amputation;  Surgeon: [name], MD;  Location: [place];  Service: Orthopedics;  Laterality: Left;  . Stump revision Left 09/23/2014    Procedure: Revision Left Below Knee Amputation;  Surgeon: [name], MD;  Location: [place];  Service: Orthopedics;  Laterality: Left;  . Stump revision Left 10/13/2014    Procedure: REVISION LEFT BELOW KNEE AMPUTATION STUMP;  Surgeon: [name], MD;  Location: [facility];  Service: Orthopedics;  Laterality: Left;  . Esophagogastroduodenoscopy N/A 02/09/2015    DR. Schooler: Normal EGD    Current Outpatient Prescriptions  Medication Sig Dispense Refill  . ACCU-CHEK AVIVA PLUS test strip     . albuterol (PROVENTIL) 4 MG tablet Take 4 mg by mouth 3 (three) times daily.     [name] albuterol (VOSPIRE ER) 4 MG 12 hr tablet Take 4 mg by mouth 2 (two) times daily.     [name] albuterol-ipratropium (COMBIVENT) 18-103 MCG/ACT inhaler Inhale 1 puff into the lungs 4 (four) times daily. Coughing/ Shortness of Breath    . allopurinol (ZYLOPRIM) 300 MG tablet Take 300 mg by mouth at bedtime.     . Ascorbic Acid (VITAMIN C WITH ROSE HIPS) 1000 MG tablet Take 1,000 mg by mouth daily.    . cholecalciferol (VITAMIN D) 1000 UNITS tablet Take 2,000 Units by mouth daily.    . clopidogrel (PLAVIX) 75 MG tablet Take 1 tablet (75 mg total) by mouth daily with breakfast. 30 tablet 11  . CRESTOR 20 MG tablet TAKE (1) TABLET BY MOUTH AT BEDTIME FOR CHOLESTEROL. 30 tablet 3  . Emollient (EUCERIN) lotion Apply 10 mLs topically as needed for dry skin.    . fenofibrate (TRICOR) 145 MG tablet TAKE 1 TABLET BY MOUTH ONCE DAILY  FOR CHOLESTEROL. (Patient taking differently: TAKE 1 TABLET BY MOUTH every evening FOR CHOLESTEROL.) 30 tablet 3  . ferrous sulfate 325 (65 FE) MG tablet Take 1 tablet (325 mg total) by mouth daily with breakfast. 30 tablet 1  . fish oil-omega-3 fatty acids 1000 MG capsule Take 1 capsule (1 g total) by mouth 2 (two) times daily.    . furosemide (LASIX) 80 MG tablet Take 1 tablet (80 mg total) by mouth daily.    [name] glimepiride (AMARYL) 1 MG tablet Take 1 mg by mouth 2 (two) times daily.    . isosorbide mononitrate (IMDUR) 30 MG 24 hr tablet TAKE ONE TABLET BY MOUTH ONCE DAILY. 30 tablet 5  . metoprolol succinate (TOPROL-XL) 50 MG 24 hr tablet Take 1 tablet (50 mg total) by mouth daily. Take with or immediately following a meal. 30 tablet 11  . nitroGLYCERIN (NITROSTAT) 0.4 MG SL tablet Place 1 tablet (0.4 mg total) under the tongue every 5 (five) minutes as needed for chest pain. 25 tablet 12  . oxyCODONE-acetaminophen (PERCOCET) 10-325 MG per tablet Take 1 tablet by mouth every 4 (four) hours as needed for pain. (Patient taking differently: Take 1 tablet by mouth 2 (two) times daily as  needed for pain. ) 30 tablet 0  . potassium chloride SA (K-DUR,KLOR-CON) 20 MEQ tablet Take 20 mEq by mouth daily.      . pregabalin (LYRICA) 50 MG capsule Take one capsule by mouth twice daily for pains 60 capsule 5  . ramipril (ALTACE) 2.5 MG capsule TAKE 1 CAPSULE BY MOUTH AT BEDTIME. 30 capsule 7  . sitaGLIPtin (JANUVIA) 50 MG tablet Take 50 mg by mouth daily.    [name] warfarin (COUMADIN) 5 MG tablet Take 1-1.5 tablets (5-7.5 mg total) by mouth at bedtime. Takes one-half tablet (7.'5mg'$  total) on Fridays. Takes one tablet ('5mg'$  total) on all other days (Patient taking differently: Take 2.5-5 mg by mouth at bedtime. Take 2.5 mg on Monday and Friday.  Take 5 mg all other days.) 30 tablet 11  . zinc gluconate 50 MG tablet Take 50 mg by mouth daily.     No current facility-administered medications for this visit.    Allergies as of 02/16/2015  . (No Known Allergies)    Family History  Problem Relation Age of Onset  . Colon cancer Neg Hx   . Liver disease Neg Hx     History   Social History  . Marital Status: Legally Separated    Spouse Name: N/A  . Number of Children: 5  . Years of Education: N/A   Occupational History  . retired     Stage manager   Social History Main Topics  . Smoking status: Former Smoker -- 1.00 packs/day for 55 years    Types: Cigarettes    Start date: 08/19/1953    Quit date: 05/07/2014  . Smokeless tobacco: Current User    Types: Chew    Last Attempt to Quit: 08/20/1995     Comment: Has quit on 3 occasions. Counseling given today 5-10 minutes   I am more than likely going to quit "  . Alcohol Use: No     Comment: socially, sometimes 12 ounce beer daily, may go month without/no whiskey  . Drug Use: No  . Sexual Activity: Not on file   Other Topics Concern  . None   Social History Narrative   Has 3 daughters   Has 2 sons    Review of Systems: General: Negative for anorexia,  fever, chills. Eyes: Negative for vision changes.  ENT: Negative for  hoarseness, difficulty swallowing. CV: Negative for chest pain, angina, palpitations, dyspnea on exertion, peripheral edema.  Respiratory: Negative for dyspnea at rest, dyspnea on exertion, cough, sputum, wheezing.  GI: See history of present illness. Derm: Negative for rash or itching.  Endo: Negative for unusual weight change.  Heme: Negative for bruising or bleeding. Allergy: Negative for rash or hives.   Physical Exam: BP 110/60 mmHg  Pulse 72  Temp(Src) 98.3 F (36.8 C) (Oral)  Ht '5\' 10"'$  (1.778 m) General:   Alert and oriented. Pleasant and cooperative. Well-nourished and well-developed.  Head:  Normocephalic and atraumatic. Eyes:  Without icterus, sclera clear and conjunctiva pink.  Ears:  Normal auditory acuity. Cardiovascular:  S1, S2 present without murmurs appreciated. Normal pulses noted. Extremities without clubbing or edema. Respiratory:  Clear to auscultation bilaterally. No wheezes, rales, or rhonchi. No distress.  Gastrointestinal:  +BS, soft, and non-distended. No worsening of abdominal tenderness with palpation. No HSM noted. No guarding or rebound. No masses appreciated.  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 Neurologic:  Alert and oriented x4;  grossly normal neurologically. Psych:  Alert and cooperative. Normal mood and affect. Heme/Lymph/Immune: No excessive bruising noted.    02/16/2015 11:00 AM

## 2015-02-17 DIAGNOSIS — R935 Abnormal findings on diagnostic imaging of other abdominal regions, including retroperitoneum: Secondary | ICD-10-CM | POA: Insufficient documentation

## 2015-02-21 ENCOUNTER — Ambulatory Visit: Payer: Medicare Other | Admitting: Physical Therapy

## 2015-02-23 DIAGNOSIS — G609 Hereditary and idiopathic neuropathy, unspecified: Secondary | ICD-10-CM | POA: Diagnosis not present

## 2015-02-23 DIAGNOSIS — E780 Pure hypercholesterolemia: Secondary | ICD-10-CM | POA: Diagnosis not present

## 2015-02-23 DIAGNOSIS — E1165 Type 2 diabetes mellitus with hyperglycemia: Secondary | ICD-10-CM | POA: Diagnosis not present

## 2015-02-23 DIAGNOSIS — I10 Essential (primary) hypertension: Secondary | ICD-10-CM | POA: Diagnosis not present

## 2015-02-23 NOTE — Patient Instructions (Signed)
1. Please have your labs drawn the next couple days. 2. We will schedule your procedure for you. 3. Further recommendations to be based on results your procedure.

## 2015-02-23 NOTE — Progress Notes (Signed)
CC'ED TO PCP 

## 2015-02-23 NOTE — Assessment & Plan Note (Signed)
Issue with a history of peripheral artery disease and CT angiogram 3 years ago consistent with mesenteric ischemia. At that time it was determined to proceed conservative measures because the patient was not symptomatic however the door was left open for possible further workup if he becomes symptomatic. The patient has been having chronic abdominal pain which is been worse in the past 3-4 weeks pain is described as dull and occurs with nausea and vomiting. Has also had a subjective weight loss of 20 pounds in the last several months ago he has had on-and-off sickness and this could be contributing to his weight loss. His pain is not any worse with eating. At this time cannot rule out symptomatic chronic mesenteric ischemia. Patient was also recommended for outpatient GI follow-up with colonoscopy to evaluate for possible cecal mass due to abnormal findings on CT. Depending on results this procedure can consider repeat CT angiogram for further evaluation of possible chronic mesenteric ischemia.  Proceed with TCS in the OR with propofol with Dr. [name] in near future: the risks, benefits, and alternatives have been discussed with the patient in detail. The patient states understanding and desires to proceed.  The patient is on Coumadin and Plavix for prosthetic heart valve and one bypass. Given high risk for thromboembolic event with prosthetic valve will not hold his Coumadin at this time.  There is a possibility depending on the findings she will have to come back for an additional procedure with heparin drip and follow-up Lovenox bridge. This can be addressed with cardiology if needed future. The patient is on chronic pain medications and Lyrica. We'll proceed with procedure and the OR propofol as his previous procedures have been done.

## 2015-02-23 NOTE — Assessment & Plan Note (Signed)
Patient with abnormal CT of the abdomen and pelvis and his last hospital admission which found wall thickening of the cecum without significant surrounding inflammation which may represent combination of underdistention and/or possible focal colitis however outpatient colonoscopy was recommended to rule out possibility of underlying mass. Given these findings and his symptoms we'll proceed with colonoscopy.  Proceed with TCS in the OR with propofol with Dr. [name] in near future: the risks, benefits, and alternatives have been discussed with the patient in detail. The patient states understanding and desires to proceed.  The patient is on Coumadin and Plavix for prosthetic heart valve and one bypass. Given high risk for thromboembolic event with prosthetic valve will not hold his Coumadin at this time.  There is a possibility depending on the findings she will have to come back for an additional procedure with heparin drip and follow-up Lovenox bridge. This can be addressed with cardiology if needed future. The patient is on chronic pain medications and Lyrica. We'll proceed with procedure and the OR propofol as his previous procedures have been done.

## 2015-02-27 ENCOUNTER — Other Ambulatory Visit: Payer: Self-pay

## 2015-02-28 ENCOUNTER — Other Ambulatory Visit: Payer: Self-pay | Admitting: Nurse Practitioner

## 2015-02-28 ENCOUNTER — Telehealth: Payer: Self-pay | Admitting: Cardiovascular Disease

## 2015-02-28 DIAGNOSIS — R935 Abnormal findings on diagnostic imaging of other abdominal regions, including retroperitoneum: Secondary | ICD-10-CM | POA: Diagnosis not present

## 2015-02-28 DIAGNOSIS — R1084 Generalized abdominal pain: Secondary | ICD-10-CM | POA: Diagnosis not present

## 2015-02-28 NOTE — Telephone Encounter (Signed)
Spoke to patient He states he went to lab today. Some there told him to call Dr [name] Per patient, he was at the lab to get lab drawn for Dr [name].  Patient states he had black stool this morning and stomach pain again. No visible blood noted Patient recently discharge from hospital about 2-3 weeks ago Patient has colonoscopy schedule 02/28/15. RN informed patient to contact Dr [name] Will notify Dr [name], and Erasmo Downer

## 2015-02-28 NOTE — Telephone Encounter (Signed)
Please call,pt had blood in his stool this morning. He went to have lab work today,the told them what happen this morning. They told him to call Dr [name]

## 2015-02-28 NOTE — Telephone Encounter (Signed)
Acknowledged; for colonoscopy

## 2015-03-01 ENCOUNTER — Other Ambulatory Visit (HOSPITAL_COMMUNITY): Payer: Self-pay | Admitting: Internal Medicine

## 2015-03-01 ENCOUNTER — Encounter (HOSPITAL_COMMUNITY): Payer: Self-pay | Admitting: Emergency Medicine

## 2015-03-01 ENCOUNTER — Inpatient Hospital Stay (HOSPITAL_COMMUNITY)
Admission: EM | Admit: 2015-03-01 | Discharge: 2015-03-11 | DRG: 357 | Disposition: A | Discharge status: Home / Self Care | Payer: Medicare Other | Attending: Internal Medicine | Admitting: Internal Medicine

## 2015-03-01 ENCOUNTER — Ambulatory Visit (HOSPITAL_COMMUNITY)
Admission: RE | Admit: 2015-03-01 | Discharge: 2015-03-01 | Disposition: A | Discharge status: Home / Self Care | Payer: Medicare Other | Source: Ambulatory Visit | Attending: Internal Medicine | Admitting: Internal Medicine

## 2015-03-01 ENCOUNTER — Other Ambulatory Visit: Payer: Self-pay

## 2015-03-01 DIAGNOSIS — E1140 Type 2 diabetes mellitus with diabetic neuropathy, unspecified: Secondary | ICD-10-CM | POA: Diagnosis not present

## 2015-03-01 DIAGNOSIS — K529 Noninfective gastroenteritis and colitis, unspecified: Secondary | ICD-10-CM | POA: Diagnosis present

## 2015-03-01 DIAGNOSIS — L97509 Non-pressure chronic ulcer of other part of unspecified foot with unspecified severity: Secondary | ICD-10-CM | POA: Diagnosis not present

## 2015-03-01 DIAGNOSIS — Z1389 Encounter for screening for other disorder: Secondary | ICD-10-CM | POA: Diagnosis not present

## 2015-03-01 DIAGNOSIS — R109 Unspecified abdominal pain: Secondary | ICD-10-CM

## 2015-03-01 DIAGNOSIS — Z955 Presence of coronary angioplasty implant and graft: Secondary | ICD-10-CM | POA: Diagnosis not present

## 2015-03-01 DIAGNOSIS — Z89512 Acquired absence of left leg below knee: Secondary | ICD-10-CM

## 2015-03-01 DIAGNOSIS — K219 Gastro-esophageal reflux disease without esophagitis: Secondary | ICD-10-CM | POA: Diagnosis present

## 2015-03-01 DIAGNOSIS — M549 Dorsalgia, unspecified: Secondary | ICD-10-CM | POA: Diagnosis present

## 2015-03-01 DIAGNOSIS — Z79891 Long term (current) use of opiate analgesic: Secondary | ICD-10-CM | POA: Diagnosis not present

## 2015-03-01 DIAGNOSIS — Z79899 Other long term (current) drug therapy: Secondary | ICD-10-CM | POA: Diagnosis not present

## 2015-03-01 DIAGNOSIS — I771 Stricture of artery: Secondary | ICD-10-CM

## 2015-03-01 DIAGNOSIS — K551 Chronic vascular disorders of intestine: Principal | ICD-10-CM | POA: Diagnosis present

## 2015-03-01 DIAGNOSIS — R197 Diarrhea, unspecified: Secondary | ICD-10-CM | POA: Insufficient documentation

## 2015-03-01 DIAGNOSIS — I2510 Atherosclerotic heart disease of native coronary artery without angina pectoris: Secondary | ICD-10-CM | POA: Diagnosis not present

## 2015-03-01 DIAGNOSIS — R1310 Dysphagia, unspecified: Secondary | ICD-10-CM | POA: Diagnosis not present

## 2015-03-01 DIAGNOSIS — Z954 Presence of other heart-valve replacement: Secondary | ICD-10-CM | POA: Diagnosis not present

## 2015-03-01 DIAGNOSIS — K550 Acute vascular disorders of intestine: Secondary | ICD-10-CM | POA: Diagnosis not present

## 2015-03-01 DIAGNOSIS — Z87891 Personal history of nicotine dependence: Secondary | ICD-10-CM

## 2015-03-01 DIAGNOSIS — E119 Type 2 diabetes mellitus without complications: Secondary | ICD-10-CM | POA: Diagnosis not present

## 2015-03-01 DIAGNOSIS — R791 Abnormal coagulation profile: Secondary | ICD-10-CM | POA: Diagnosis present

## 2015-03-01 DIAGNOSIS — I10 Essential (primary) hypertension: Secondary | ICD-10-CM | POA: Diagnosis present

## 2015-03-01 DIAGNOSIS — D649 Anemia, unspecified: Secondary | ICD-10-CM | POA: Diagnosis not present

## 2015-03-01 DIAGNOSIS — N400 Enlarged prostate without lower urinary tract symptoms: Secondary | ICD-10-CM | POA: Diagnosis not present

## 2015-03-01 DIAGNOSIS — G629 Polyneuropathy, unspecified: Secondary | ICD-10-CM | POA: Diagnosis not present

## 2015-03-01 DIAGNOSIS — K559 Vascular disorder of intestine, unspecified: Secondary | ICD-10-CM

## 2015-03-01 DIAGNOSIS — R634 Abnormal weight loss: Secondary | ICD-10-CM

## 2015-03-01 DIAGNOSIS — D62 Acute posthemorrhagic anemia: Secondary | ICD-10-CM | POA: Diagnosis not present

## 2015-03-01 DIAGNOSIS — N320 Bladder-neck obstruction: Secondary | ICD-10-CM | POA: Diagnosis not present

## 2015-03-01 DIAGNOSIS — N200 Calculus of kidney: Secondary | ICD-10-CM | POA: Diagnosis not present

## 2015-03-01 DIAGNOSIS — R1030 Lower abdominal pain, unspecified: Secondary | ICD-10-CM | POA: Diagnosis not present

## 2015-03-01 DIAGNOSIS — J449 Chronic obstructive pulmonary disease, unspecified: Secondary | ICD-10-CM | POA: Diagnosis present

## 2015-03-01 DIAGNOSIS — E1159 Type 2 diabetes mellitus with other circulatory complications: Secondary | ICD-10-CM | POA: Diagnosis not present

## 2015-03-01 DIAGNOSIS — Z89519 Acquired absence of unspecified leg below knee: Secondary | ICD-10-CM

## 2015-03-01 DIAGNOSIS — R1031 Right lower quadrant pain: Secondary | ICD-10-CM | POA: Diagnosis not present

## 2015-03-01 DIAGNOSIS — N179 Acute kidney failure, unspecified: Secondary | ICD-10-CM | POA: Diagnosis present

## 2015-03-01 DIAGNOSIS — G4730 Sleep apnea, unspecified: Secondary | ICD-10-CM | POA: Diagnosis present

## 2015-03-01 DIAGNOSIS — I252 Old myocardial infarction: Secondary | ICD-10-CM | POA: Diagnosis not present

## 2015-03-01 DIAGNOSIS — M109 Gout, unspecified: Secondary | ICD-10-CM | POA: Diagnosis present

## 2015-03-01 DIAGNOSIS — D509 Iron deficiency anemia, unspecified: Secondary | ICD-10-CM | POA: Diagnosis not present

## 2015-03-01 DIAGNOSIS — Z952 Presence of prosthetic heart valve: Secondary | ICD-10-CM | POA: Diagnosis not present

## 2015-03-01 DIAGNOSIS — Z951 Presence of aortocoronary bypass graft: Secondary | ICD-10-CM

## 2015-03-01 DIAGNOSIS — R1084 Generalized abdominal pain: Secondary | ICD-10-CM | POA: Diagnosis not present

## 2015-03-01 DIAGNOSIS — E1122 Type 2 diabetes mellitus with diabetic chronic kidney disease: Secondary | ICD-10-CM | POA: Diagnosis not present

## 2015-03-01 DIAGNOSIS — G8929 Other chronic pain: Secondary | ICD-10-CM | POA: Diagnosis present

## 2015-03-01 DIAGNOSIS — E1169 Type 2 diabetes mellitus with other specified complication: Secondary | ICD-10-CM | POA: Diagnosis not present

## 2015-03-01 DIAGNOSIS — Z7902 Long term (current) use of antithrombotics/antiplatelets: Secondary | ICD-10-CM

## 2015-03-01 DIAGNOSIS — J438 Other emphysema: Secondary | ICD-10-CM | POA: Diagnosis not present

## 2015-03-01 DIAGNOSIS — Z7901 Long term (current) use of anticoagulants: Secondary | ICD-10-CM | POA: Diagnosis present

## 2015-03-01 DIAGNOSIS — E780 Pure hypercholesterolemia: Secondary | ICD-10-CM | POA: Diagnosis present

## 2015-03-01 DIAGNOSIS — K5289 Other specified noninfective gastroenteritis and colitis: Secondary | ICD-10-CM | POA: Insufficient documentation

## 2015-03-01 DIAGNOSIS — I5022 Chronic systolic (congestive) heart failure: Secondary | ICD-10-CM | POA: Diagnosis not present

## 2015-03-01 DIAGNOSIS — I739 Peripheral vascular disease, unspecified: Secondary | ICD-10-CM | POA: Diagnosis not present

## 2015-03-01 LAB — POCT I-STAT CREATININE: CREATININE: 1.9 mg/dL — AB (ref 0.61–1.24)

## 2015-03-01 LAB — COMPREHENSIVE METABOLIC PANEL
ALK PHOS: 34 U/L — AB (ref 38–126)
ALT: 13 U/L — ABNORMAL LOW (ref 17–63)
AST: 25 U/L (ref 15–41)
Albumin: 3.1 g/dL — ABNORMAL LOW (ref 3.5–5.0)
Anion gap: 11 (ref 5–15)
BUN: 63 mg/dL — AB (ref 6–20)
CO2: 22 mmol/L (ref 22–32)
Calcium: 8.6 mg/dL — ABNORMAL LOW (ref 8.9–10.3)
Chloride: 101 mmol/L (ref 101–111)
Creatinine, Ser: 1.93 mg/dL — ABNORMAL HIGH (ref 0.61–1.24)
GFR, EST AFRICAN AMERICAN: 38 mL/min — AB (ref >60)
GFR, EST NON AFRICAN AMERICAN: 33 mL/min — AB (ref >60)
Glucose, Bld: 94 mg/dL (ref 65–99)
Potassium: 4.2 mmol/L (ref 3.5–5.1)
Sodium: 134 mmol/L — ABNORMAL LOW (ref 135–145)
Total Bilirubin: 0.6 mg/dL (ref 0.3–1.2)
Total Protein: 6.8 g/dL (ref 6.5–8.1)

## 2015-03-01 LAB — CBC
HEMATOCRIT: 26.7 % — AB (ref 39.0–52.0)
Hemoglobin: 8.3 g/dL — ABNORMAL LOW (ref 13.0–17.0)
MCH: 22.7 pg — AB (ref 26.0–34.0)
MCHC: 31.1 g/dL (ref 30.0–36.0)
MCV: 73.2 fL — ABNORMAL LOW (ref 78.0–100.0)
PLATELETS: 352 10*3/uL (ref 150–400)
RBC: 3.65 MIL/uL — ABNORMAL LOW (ref 4.22–5.81)
RDW: 22.9 % — ABNORMAL HIGH (ref 11.5–15.5)
WBC: 17.5 10*3/uL — AB (ref 4.0–10.5)

## 2015-03-01 LAB — PROTIME-INR
INR: 4.25 — ABNORMAL HIGH (ref 0.00–1.49)
Prothrombin Time: 39.8 seconds — ABNORMAL HIGH (ref 11.6–15.2)

## 2015-03-01 LAB — GLUCOSE, CAPILLARY
GLUCOSE-CAPILLARY: 93 mg/dL (ref 65–99)
Glucose-Capillary: 65 mg/dL (ref 65–99)
Glucose-Capillary: 63 mg/dL — ABNORMAL LOW (ref 65–99)

## 2015-03-01 LAB — I-STAT CG4 LACTIC ACID, ED: LACTIC ACID, VENOUS: 1.56 mmol/L (ref 0.5–2.0)

## 2015-03-01 LAB — GLUCOSE, RANDOM: Glucose, Bld: 88 mg/dL (ref 65–99)

## 2015-03-01 MED ORDER — FUROSEMIDE 40 MG PO TABS: 80.0000 mg | ORAL_TABLET | Freq: Every day | ORAL | Status: DC

## 2015-03-01 MED ORDER — ALBUTEROL SULFATE 4 MG PO TABS: 4.0000 mg | ORAL_TABLET | Freq: Three times a day (TID) | ORAL | Status: DC

## 2015-03-01 MED ORDER — METRONIDAZOLE IN NACL 5-0.79 MG/ML-% IV SOLN
500.0000 mg | Freq: Once | INTRAVENOUS | Status: AC
  Administered 2015-03-01: 500 mg via INTRAVENOUS
  Filled 2015-03-01: qty 100

## 2015-03-01 MED ORDER — WARFARIN SODIUM 5 MG PO TABS: 5.0000 mg | ORAL_TABLET | Freq: Every day | ORAL | Status: DC

## 2015-03-01 MED ORDER — OXYCODONE-ACETAMINOPHEN 5-325 MG PO TABS
1.0000 | ORAL_TABLET | ORAL | Status: DC | PRN
  Administered 2015-03-02 – 2015-03-08 (×6): 1 via ORAL
  Filled 2015-03-01 (×6): qty 1

## 2015-03-01 MED ORDER — SODIUM CHLORIDE 0.9 % IV SOLN: INTRAVENOUS | Status: DC

## 2015-03-01 MED ORDER — ONDANSETRON HCL 4 MG/2ML IJ SOLN: 4.0000 mg | Freq: Three times a day (TID) | INTRAMUSCULAR | Status: AC | PRN

## 2015-03-01 MED ORDER — ALBUTEROL SULFATE ER 4 MG PO TB12
ORAL_TABLET | ORAL | Status: AC
  Filled 2015-03-01: qty 1

## 2015-03-01 MED ORDER — FERROUS SULFATE 325 (65 FE) MG PO TABS
325.0000 mg | ORAL_TABLET | Freq: Every day | ORAL | Status: DC
  Administered 2015-03-02 – 2015-03-11 (×10): 325 mg via ORAL
  Filled 2015-03-01 (×11): qty 1

## 2015-03-01 MED ORDER — SODIUM CHLORIDE 0.9 % IJ SOLN
3.0000 mL | Freq: Two times a day (BID) | INTRAMUSCULAR | Status: DC
  Administered 2015-03-02 – 2015-03-11 (×12): 3 mL via INTRAVENOUS

## 2015-03-01 MED ORDER — CIPROFLOXACIN IN D5W 200 MG/100ML IV SOLN
200.0000 mg | Freq: Two times a day (BID) | INTRAVENOUS | Status: DC
  Administered 2015-03-02 – 2015-03-08 (×13): 200 mg via INTRAVENOUS
  Filled 2015-03-01 (×17): qty 100

## 2015-03-01 MED ORDER — IPRATROPIUM-ALBUTEROL 0.5-2.5 (3) MG/3ML IN SOLN
3.0000 mL | Freq: Four times a day (QID) | RESPIRATORY_TRACT | Status: DC
  Administered 2015-03-01 – 2015-03-02 (×3): 3 mL via RESPIRATORY_TRACT
  Filled 2015-03-01 (×2): qty 3

## 2015-03-01 MED ORDER — GLIMEPIRIDE 2 MG PO TABS
1.0000 mg | ORAL_TABLET | Freq: Two times a day (BID) | ORAL | Status: DC
  Filled 2015-03-01 (×5): qty 1

## 2015-03-01 MED ORDER — ROSUVASTATIN CALCIUM 20 MG PO TABS
20.0000 mg | ORAL_TABLET | Freq: Every day | ORAL | Status: DC
Start: 2015-03-01 — End: 2015-03-11
  Administered 2015-03-02 – 2015-03-10 (×10): 20 mg via ORAL
  Filled 2015-03-01 (×11): qty 1

## 2015-03-01 MED ORDER — CIPROFLOXACIN IN D5W 400 MG/200ML IV SOLN
400.0000 mg | Freq: Once | INTRAVENOUS | Status: DC
  Filled 2015-03-01: qty 200

## 2015-03-01 MED ORDER — ALBUTEROL SULFATE ER 4 MG PO TB12
4.0000 mg | ORAL_TABLET | Freq: Two times a day (BID) | ORAL | Status: DC
  Administered 2015-03-01 – 2015-03-11 (×19): 4 mg via ORAL
  Filled 2015-03-01 (×25): qty 1

## 2015-03-01 MED ORDER — SODIUM CHLORIDE 0.9 % IV SOLN
INTRAVENOUS | Status: DC
  Administered 2015-03-01 – 2015-03-03 (×6): via INTRAVENOUS

## 2015-03-01 MED ORDER — IOHEXOL 300 MG/ML  SOLN
100.0000 mL | Freq: Once | INTRAMUSCULAR | Status: AC | PRN
  Administered 2015-03-01: 80 mL via INTRAVENOUS

## 2015-03-01 MED ORDER — PREGABALIN 25 MG PO CAPS
50.0000 mg | ORAL_CAPSULE | Freq: Two times a day (BID) | ORAL | Status: DC
  Administered 2015-03-01 – 2015-03-11 (×20): 50 mg via ORAL
  Filled 2015-03-01 (×4): qty 2
  Filled 2015-03-01: qty 1
  Filled 2015-03-01: qty 2
  Filled 2015-03-01 (×4): qty 1
  Filled 2015-03-01 (×4): qty 2
  Filled 2015-03-01 (×2): qty 1
  Filled 2015-03-01: qty 2
  Filled 2015-03-01: qty 1
  Filled 2015-03-01: qty 2

## 2015-03-01 MED ORDER — HYDROMORPHONE HCL 1 MG/ML IJ SOLN: 1.0000 mg | INTRAMUSCULAR | Status: AC | PRN

## 2015-03-01 MED ORDER — FUROSEMIDE 40 MG PO TABS
40.0000 mg | ORAL_TABLET | Freq: Every day | ORAL | Status: DC
  Administered 2015-03-02 – 2015-03-11 (×10): 40 mg via ORAL
  Filled 2015-03-01 (×10): qty 1

## 2015-03-01 MED ORDER — CIPROFLOXACIN IN D5W 400 MG/200ML IV SOLN
400.0000 mg | Freq: Once | INTRAVENOUS | Status: AC
  Administered 2015-03-01: 400 mg via INTRAVENOUS

## 2015-03-01 MED ORDER — INSULIN ASPART 100 UNIT/ML ~~LOC~~ SOLN
0.0000 [IU] | Freq: Three times a day (TID) | SUBCUTANEOUS | Status: DC
  Administered 2015-03-02 – 2015-03-04 (×5): 1 [IU] via SUBCUTANEOUS
  Administered 2015-03-04 (×2): 2 [IU] via SUBCUTANEOUS
  Administered 2015-03-05: 1 [IU] via SUBCUTANEOUS
  Administered 2015-03-05: 2 [IU] via SUBCUTANEOUS
  Administered 2015-03-05 – 2015-03-06 (×2): 1 [IU] via SUBCUTANEOUS
  Administered 2015-03-06: 2 [IU] via SUBCUTANEOUS
  Administered 2015-03-06: 1 [IU] via SUBCUTANEOUS
  Administered 2015-03-07: 2 [IU] via SUBCUTANEOUS
  Administered 2015-03-07: 1 [IU] via SUBCUTANEOUS
  Administered 2015-03-07 – 2015-03-08 (×3): 2 [IU] via SUBCUTANEOUS
  Administered 2015-03-08: 3 [IU] via SUBCUTANEOUS
  Administered 2015-03-09: 1 [IU] via SUBCUTANEOUS
  Administered 2015-03-09: 2 [IU] via SUBCUTANEOUS
  Administered 2015-03-09: 3 [IU] via SUBCUTANEOUS
  Administered 2015-03-10 (×2): 2 [IU] via SUBCUTANEOUS
  Administered 2015-03-10: 3 [IU] via SUBCUTANEOUS
  Administered 2015-03-11: 1 [IU] via SUBCUTANEOUS

## 2015-03-01 MED ORDER — POTASSIUM CHLORIDE CRYS ER 20 MEQ PO TBCR
20.0000 meq | EXTENDED_RELEASE_TABLET | Freq: Every day | ORAL | Status: DC
  Administered 2015-03-02 – 2015-03-11 (×10): 20 meq via ORAL
  Filled 2015-03-01 (×10): qty 1

## 2015-03-01 MED ORDER — INSULIN ASPART 100 UNIT/ML ~~LOC~~ SOLN
0.0000 [IU] | Freq: Every day | SUBCUTANEOUS | Status: DC
  Administered 2015-03-03: 2 [IU] via SUBCUTANEOUS

## 2015-03-01 MED ORDER — OXYCODONE HCL 5 MG PO TABS
5.0000 mg | ORAL_TABLET | ORAL | Status: DC | PRN
  Administered 2015-03-02 – 2015-03-08 (×5): 5 mg via ORAL
  Filled 2015-03-01 (×5): qty 1

## 2015-03-01 MED ORDER — WARFARIN - PHARMACIST DOSING INPATIENT: Status: DC

## 2015-03-01 MED ORDER — CIPROFLOXACIN IN D5W 200 MG/100ML IV SOLN
200.0000 mg | Freq: Two times a day (BID) | INTRAVENOUS | Status: DC
  Filled 2015-03-01: qty 100

## 2015-03-01 MED ORDER — CLOPIDOGREL BISULFATE 75 MG PO TABS
75.0000 mg | ORAL_TABLET | Freq: Every day | ORAL | Status: DC
  Administered 2015-03-02 – 2015-03-11 (×10): 75 mg via ORAL
  Filled 2015-03-01 (×11): qty 1

## 2015-03-01 MED ORDER — ZINC GLUCONATE 50 MG PO TABS: 50.0000 mg | ORAL_TABLET | Freq: Every day | ORAL | Status: DC

## 2015-03-01 MED ORDER — RAMIPRIL 1.25 MG PO CAPS
2.5000 mg | ORAL_CAPSULE | Freq: Every day | ORAL | Status: DC
  Administered 2015-03-02: 2.5 mg via ORAL
  Filled 2015-03-01: qty 2
  Filled 2015-03-01 (×3): qty 1

## 2015-03-01 MED ORDER — VITAMIN D3 25 MCG (1000 UNIT) PO TABS
2000.0000 [IU] | ORAL_TABLET | Freq: Every day | ORAL | Status: DC
  Administered 2015-03-02 – 2015-03-11 (×10): 2000 [IU] via ORAL
  Filled 2015-03-01 (×14): qty 2

## 2015-03-01 MED ORDER — NITROGLYCERIN 0.4 MG SL SUBL: 0.4000 mg | SUBLINGUAL_TABLET | SUBLINGUAL | Status: DC | PRN

## 2015-03-01 MED ORDER — FENOFIBRATE 160 MG PO TABS
160.0000 mg | ORAL_TABLET | Freq: Every day | ORAL | Status: DC
  Administered 2015-03-02 – 2015-03-11 (×10): 160 mg via ORAL
  Filled 2015-03-01 (×10): qty 1

## 2015-03-01 MED ORDER — OMEGA-3-ACID ETHYL ESTERS 1 G PO CAPS
1.0000 g | ORAL_CAPSULE | Freq: Two times a day (BID) | ORAL | Status: DC
  Administered 2015-03-01 – 2015-03-11 (×20): 1 g via ORAL
  Filled 2015-03-01 (×22): qty 1

## 2015-03-01 MED ORDER — SUCRALFATE 1 G PO TABS
1.0000 g | ORAL_TABLET | Freq: Two times a day (BID) | ORAL | Status: DC
  Administered 2015-03-01 – 2015-03-11 (×20): 1 g via ORAL
  Filled 2015-03-01 (×21): qty 1

## 2015-03-01 MED ORDER — METRONIDAZOLE IN NACL 5-0.79 MG/ML-% IV SOLN
500.0000 mg | Freq: Three times a day (TID) | INTRAVENOUS | Status: DC
  Administered 2015-03-02 – 2015-03-08 (×21): 500 mg via INTRAVENOUS
  Filled 2015-03-01 (×23): qty 100

## 2015-03-01 MED ORDER — IPRATROPIUM-ALBUTEROL 0.5-2.5 (3) MG/3ML IN SOLN
3.0000 mL | Freq: Four times a day (QID) | RESPIRATORY_TRACT | Status: DC
  Filled 2015-03-01: qty 3

## 2015-03-01 MED ORDER — ONDANSETRON HCL 4 MG PO TABS: 4.0000 mg | ORAL_TABLET | Freq: Four times a day (QID) | ORAL | Status: DC | PRN

## 2015-03-01 MED ORDER — ONDANSETRON HCL 4 MG/2ML IJ SOLN: 4.0000 mg | Freq: Four times a day (QID) | INTRAMUSCULAR | Status: DC | PRN

## 2015-03-01 MED ORDER — VITAMIN C 500 MG PO TABS
1000.0000 mg | ORAL_TABLET | Freq: Every day | ORAL | Status: DC
  Administered 2015-03-02 – 2015-03-11 (×10): 1000 mg via ORAL
  Filled 2015-03-01 (×10): qty 2

## 2015-03-01 MED ORDER — OXYCODONE-ACETAMINOPHEN 10-325 MG PO TABS: 1.0000 | ORAL_TABLET | ORAL | Status: DC | PRN

## 2015-03-01 MED ORDER — METOPROLOL SUCCINATE ER 50 MG PO TB24
50.0000 mg | ORAL_TABLET | Freq: Every day | ORAL | Status: DC
  Administered 2015-03-02 – 2015-03-09 (×8): 50 mg via ORAL
  Filled 2015-03-01 (×8): qty 1

## 2015-03-01 MED ORDER — METRONIDAZOLE IN NACL 5-0.79 MG/ML-% IV SOLN: 500.0000 mg | Freq: Three times a day (TID) | INTRAVENOUS | Status: DC

## 2015-03-01 MED ORDER — LINAGLIPTIN 5 MG PO TABS
5.0000 mg | ORAL_TABLET | Freq: Every day | ORAL | Status: DC
  Administered 2015-03-02 – 2015-03-11 (×10): 5 mg via ORAL
  Filled 2015-03-01 (×10): qty 1

## 2015-03-01 MED ORDER — GLUCOSE 40 % PO GEL
ORAL | Status: AC
  Filled 2015-03-01: qty 1

## 2015-03-01 MED ORDER — ISOSORBIDE MONONITRATE ER 30 MG PO TB24
30.0000 mg | ORAL_TABLET | Freq: Every day | ORAL | Status: DC
  Administered 2015-03-02 – 2015-03-11 (×10): 30 mg via ORAL
  Filled 2015-03-01 (×10): qty 1

## 2015-03-01 MED ORDER — ALLOPURINOL 300 MG PO TABS
300.0000 mg | ORAL_TABLET | Freq: Every day | ORAL | Status: DC
Start: 2015-03-01 — End: 2015-03-11
  Administered 2015-03-01 – 2015-03-10 (×10): 300 mg via ORAL
  Filled 2015-03-01 (×11): qty 1

## 2015-03-01 MED ORDER — OMEGA-3 FATTY ACIDS 1000 MG PO CAPS: 1.0000 g | ORAL_CAPSULE | Freq: Two times a day (BID) | ORAL | Status: DC

## 2015-03-01 NOTE — ED Provider Notes (Addendum)
CSN: [id number]     Arrival date & time 03/01/15  1503 History   First MD Initiated Contact with Patient 03/01/15 1526     Chief Complaint  Patient presents with  . Abdominal Pain     (Consider location/radiation/quality/duration/timing/severity/associated sxs/prior Treatment) HPI Patient presents with concern of abdominal pain, nausea, vomiting, black stool. Symptoms have been present for some time, though worse over the past 3 days. With increased symptoms he saw his physician, was referred for outpatient meeting. No clear precipitant, and since his symptoms have become a more profound, no clear alleviating factors.  There is ongoing anorexia, fatigue as well, but no significant, confusion, disorientation. Family states the patient has had fever recently.  Past Medical History  Diagnosis Date  . Diabetes mellitus 2007  . Hypertension   . Gout   . Hypercholesteremia   . Peripheral vascular disease     stents lower extremity  . COPD (chronic obstructive pulmonary disease)   . S/P aortic valve replacement 1990    a. [employer]  . Chronic back pain   . Dysphagia   . Neuromuscular disorder   . Peripheral neuropathy   . Critical lower limb ischemia   . CAD (coronary artery disease)     a. 05/13/14 [name] s/p overlapping DESx2 to SVG to RCA. b.  s/p CABG '90 with redo '94 & stent to RCA SVG in 2005  . Chronic toe ulcer     a. left foot  . Shortness of breath   . Myocardial infarction   . CHF (congestive heart failure)   . Stone in kidney   . GERD (gastroesophageal reflux disease)   . Arthritis   . Sleep apnea     tested greater than 7 years ago per patient   Past Surgical History  Procedure Laterality Date  . Open heart surgery  1990    prosthetic heart valve, one bypass  . Rotator cuff repair      right  . Cataract extraction      bilateral  . Coronary stent placement  2005    RCA vein graft A 3.0x13.0 TAXUS stent was then placed int he vessel a Viva 3.0x4.0 (perfusion  balloon was made ready it was placed through the entire lenght of the stent  . Peripheral vascular procedures lower extremities      right external iliac  artery PTA and stenting as well as bilateral SFA intervention remotely. Repeat procedures in 2011 bilaterally  . Coronary artery bypass graft  1994    6 vessels  . [name]  06/13/2011    Procedure: Venia Minks DILATION;  Surgeon: [name], MD;  Location: [facility];  Service: Endoscopy;  Laterality: N/A;  Dilated to 56.   . Angioplasty illiac artery    . Back surgery  [id number]    2  . Eye surgery    . Amputation Left 07/13/2014    Procedure: Transmetatarsal Amputation;  Surgeon: [name], MD;  Location: [place];  Service: Orthopedics;  Laterality: Left;  . Lower extremity angiogram N/A 02/15/2013    Procedure: LOWER EXTREMITY ANGIOGRAM;  Surgeon: [name], MD;  Location: [facility] CATH LAB;  Service: Cardiovascular;  Laterality: N/A;  . Left heart catheterization with coronary angiogram N/A 05/11/2014    Procedure: LEFT HEART CATHETERIZATION WITH CORONARY ANGIOGRAM;  Surgeon: [name], MD;  Location: [facility] CATH LAB;  Service: Cardiovascular;  Laterality: N/A;  . Percutaneous coronary stent intervention (pci-s) N/A 05/13/2014    Procedure: PERCUTANEOUS  CORONARY STENT INTERVENTION (PCI-S);  Surgeon: [name], MD;  Location: [facility] CATH LAB;  Service: Cardiovascular;  Laterality: N/A;  . Lower extremity angiogram N/A 06/06/2014    Procedure: LOWER EXTREMITY ANGIOGRAM;  Surgeon: [name], MD;  Location: [facility] CATH LAB;  Service: Cardiovascular;  Laterality: N/A;  . Amputation Left 08/20/2014    Procedure: Revision Transmetatarsal Amputation versus Below Knee Amputation;  Surgeon: [name], MD;  Location: [place];  Service: Orthopedics;  Laterality: Left;  . Stump revision Left 09/23/2014    Procedure: Revision Left Below Knee Amputation;  Surgeon: [name], MD;  Location: [place];  Service: Orthopedics;   Laterality: Left;  . Stump revision Left 10/13/2014    Procedure: REVISION LEFT BELOW KNEE AMPUTATION STUMP;  Surgeon: [name], MD;  Location: [facility];  Service: Orthopedics;  Laterality: Left;  . Esophagogastroduodenoscopy N/A 02/09/2015    DR. Schooler: Normal EGD   Family History  Problem Relation Age of Onset  . Colon cancer Neg Hx   . Liver disease Neg Hx    History  Substance Use Topics  . Smoking status: Former Smoker -- 1.00 packs/day for 55 years    Types: Cigarettes    Start date: 08/19/1953    Quit date: 05/07/2014  . Smokeless tobacco: Current User    Types: Chew    Last Attempt to Quit: 08/20/1995     Comment: Has quit on 3 occasions. Counseling given today 5-10 minutes   I am more than likely going to quit "  . Alcohol Use: No     Comment: socially, sometimes 12 ounce beer daily, may go month without/no whiskey    Review of Systems  Constitutional:       Per HPI, otherwise negative  HENT:       Per HPI, otherwise negative  Respiratory:       Per HPI, otherwise negative  Cardiovascular:       Per HPI, otherwise negative  Gastrointestinal: Positive for vomiting, abdominal pain and abdominal distention.  Endocrine:       Negative aside from HPI  Genitourinary:       Neg aside from HPI   Musculoskeletal:       Per HPI, otherwise negative  Skin: Negative.   Neurological: Negative for syncope.      Allergies  Review of patient's allergies indicates no known allergies.  Home Medications   Prior to Admission medications   Medication Sig Start Date End Date Taking? Authorizing Provider  ACCU-CHEK AVIVA PLUS test strip  12/22/14   Historical Provider, MD  albuterol (PROVENTIL) 4 MG tablet Take 4 mg by mouth 3 (three) times daily. 01/20/15   Historical Provider, MD  albuterol (VOSPIRE ER) 4 MG 12 hr tablet Take 4 mg by mouth 2 (two) times daily.     Historical Provider, MD  albuterol-ipratropium (COMBIVENT) 18-103 MCG/ACT inhaler Inhale 1 puff into  the lungs 4 (four) times daily. Coughing/ Shortness of Breath    Historical Provider, MD  allopurinol (ZYLOPRIM) 300 MG tablet Take 300 mg by mouth at bedtime.     Historical Provider, MD  Ascorbic Acid (VITAMIN C WITH ROSE HIPS) 1000 MG tablet Take 1,000 mg by mouth daily.    Historical Provider, MD  cholecalciferol (VITAMIN D) 1000 UNITS tablet Take 2,000 Units by mouth daily.    Historical Provider, MD  clopidogrel (PLAVIX) 75 MG tablet Take 1 tablet (75 mg total) by mouth daily with breakfast. 05/17/14   Curt Bears  R Thompson, PA-C  CRESTOR 20 MG tablet TAKE (1) TABLET BY MOUTH AT BEDTIME FOR CHOLESTEROL. 01/19/15   [name], MD  Emollient (EUCERIN) lotion Apply 10 mLs topically as needed for dry skin.    Historical Provider, MD  fenofibrate (TRICOR) 145 MG tablet TAKE 1 TABLET BY MOUTH ONCE DAILY FOR CHOLESTEROL. Patient taking differently: TAKE 1 TABLET BY MOUTH every evening FOR CHOLESTEROL. 01/19/15   [name], MD  ferrous sulfate 325 (65 FE) MG tablet Take 1 tablet (325 mg total) by mouth daily with breakfast. 02/12/15   Delfina Redwood, MD  fish oil-omega-3 fatty acids 1000 MG capsule Take 1 capsule (1 g total) by mouth 2 (two) times daily. 01/22/13   Casimiro Needle Alvstad, RPH-CPP  furosemide (LASIX) 80 MG tablet Take 1 tablet (80 mg total) by mouth daily. 02/12/15   Delfina Redwood, MD  glimepiride (AMARYL) 1 MG tablet Take 1 mg by mouth 2 (two) times daily.    Historical Provider, MD  isosorbide mononitrate (IMDUR) 30 MG 24 hr tablet TAKE ONE TABLET BY MOUTH ONCE DAILY. 10/25/14   Troy Sine, MD  metoprolol succinate (TOPROL-XL) 50 MG 24 hr tablet Take 1 tablet (50 mg total) by mouth daily. Take with or immediately following a meal. 05/17/14   Eileen Stanford, PA-C  nitroGLYCERIN (NITROSTAT) 0.4 MG SL tablet Place 1 tablet (0.4 mg total) under the tongue every 5 (five) minutes as needed for chest pain. 05/17/14   Eileen Stanford, PA-C  oxyCODONE-acetaminophen (PERCOCET)  10-325 MG per tablet Take 1 tablet by mouth every 4 (four) hours as needed for pain. Patient taking differently: Take 1 tablet by mouth 2 (two) times daily as needed for pain.  09/23/14   [name], MD  potassium chloride SA (K-DUR,KLOR-CON) 20 MEQ tablet Take 20 mEq by mouth daily.      Historical Provider, MD  pregabalin (LYRICA) 50 MG capsule Take one capsule by mouth twice daily for pains 08/23/14   Blanchie Serve, MD  ramipril (ALTACE) 2.5 MG capsule TAKE 1 CAPSULE BY MOUTH AT BEDTIME. 09/27/14   [name], MD  sitaGLIPtin (JANUVIA) 50 MG tablet Take 50 mg by mouth daily.    Historical Provider, MD  sucralfate (CARAFATE) 1 G tablet Take 1 g by mouth 2 (two) times daily.  02/15/15   Historical Provider, MD  warfarin (COUMADIN) 5 MG tablet Take 1-1.5 tablets (5-7.5 mg total) by mouth at bedtime. Takes one-half tablet (7.'5mg'$  total) on Fridays. Takes one tablet ('5mg'$  total) on all other days Patient taking differently: Take 2.5-5 mg by mouth at bedtime. Take 2.5 mg on Monday and Friday.  Take 5 mg all other days. 01/01/15   Belkys A Regalado, MD  zinc gluconate 50 MG tablet Take 50 mg by mouth daily.    Historical Provider, MD   BP 127/34 mmHg  Pulse 70  Temp(Src) 97.9 F (36.6 C) (Oral)  Resp 23  Ht '5\' 10"'$  (1.778 m)  Wt 195 lb (88.451 kg)  BMI 27.98 kg/m2  SpO2 99% Physical Exam  Constitutional: He is oriented to person, place, and time. He appears well-developed. No distress.  HENT:  Head: Normocephalic and atraumatic.  Eyes: Conjunctivae and EOM are normal.  Cardiovascular: Normal rate and regular rhythm.   Pulmonary/Chest: Effort normal. No stridor. No respiratory distress.  Abdominal: He exhibits no distension. There is no hepatosplenomegaly. There is tenderness. There is guarding.    Musculoskeletal: He exhibits no edema.  Left  below-the-knee amputation  Neurological: He is alert and oriented to person, place, and time.  Skin: Skin is warm and dry.  Psychiatric: He has a  normal mood and affect.  Nursing note and vitals reviewed.   ED Course  Procedures (including critical care time) Labs Review Labs Reviewed  COMPREHENSIVE METABOLIC PANEL - Abnormal; Notable for the following:    Sodium 134 (*)    BUN 63 (*)    Creatinine, Ser 1.93 (*)    Calcium 8.6 (*)    Albumin 3.1 (*)    ALT 13 (*)    Alkaline Phosphatase 34 (*)    GFR calc non Af Amer 33 (*)    GFR calc Af Amer 38 (*)    All other components within normal limits  CBC - Abnormal; Notable for the following:    WBC 17.5 (*)    RBC 3.65 (*)    Hemoglobin 8.3 (*)    HCT 26.7 (*)    MCV 73.2 (*)    MCH 22.7 (*)    RDW 22.9 (*)    All other components within normal limits  I-STAT CG4 LACTIC ACID, ED  TYPE AND SCREEN    Imaging Review Ct Abdomen Pelvis W Contrast  03/01/2015   CLINICAL DATA:  Generalized abdominal pain, several month duration. Diarrhea.  EXAM: CT ABDOMEN AND PELVIS WITH CONTRAST  TECHNIQUE: Multidetector CT imaging of the abdomen and pelvis was performed using the standard protocol following bolus administration of intravenous contrast.  CONTRAST:  31m OMNIPAQUE IOHEXOL 300 MG/ML  SOLN  COMPARISON:  02/05/2015  FINDINGS: There is chronic pleural and parenchymal scarring at the left base with calcification. No active chest disease is identified.  There is considerable motion degradation in the upper abdomen. The left lobe of the liver is prominent and there is slight lobulation of the right lobe of the liver raising the possibility of early cirrhosis. There may be some sludge or noncalcified stones dependent in the gallbladder. The spleen is normal. The pancreas is normal. The adrenal glands are normal. No renal parenchymal lesion is seen. There is atrophy at the lower pole the right kidney. There is a punctate stone in the upper pole the right kidney. The kidneys do not appear to be excreting contrast, 3 minutes after injection. There is advanced atherosclerosis of the aorta and its  branch vessels. The IVC is normal.  There are several small bowel loops probably in the ileum that show wall thickening. Ischemic changes a concern given the degree of vascular disease. Inflammatory bowel disease or infectious inflammation could have a similar appearance. No perforation. No free fluid. The colon appears normal today.  The bladder is thick walled. The prostate is slightly enlarged. No pelvic lymphadenopathy. There are extensive chronic degenerative changes of the spine. There is a small ventral hernia containing only fat.  IMPRESSION: Abnormal thick walled loops of small intestine, probably within the ileum. 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 The differential diagnosis does include inflammatory bowel disease and infectious inflammation.  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  Poor excretion of contrast 3 minutes after injection suggesting the possibility of severely impaired renal function.  These results will be called to the ordering clinician or representative by the Radiologist Assistant, and communication documented in the PACS or zVision Dashboard.   Electronically Signed   By: MNelson ChimesM.D.   On: 03/01/2015 14:43     EKG Interpretation None     Cardiac 70  sinus normal Pulse ox 100% room air normal I reviewed the patient's chart, including primary care referral for CT scan. CT scan suggests enteritis.  On repeat exam the patient appears calm  MDM   patient was assessed with ongoing abdominal pain, is found to have leukocytosis, enteritis on CT scan. Inflammatory changes may be ischemic versus infectious. No lactic acidosis, no fever are reassuring. Patient received fluid resuscitation, broad-spectrum antibiotics and emergency department.   Carmin Muskrat, MD 03/01/15 1631  Carmin Muskrat, MD 03/01/15 [phone]

## 2015-03-01 NOTE — Progress Notes (Signed)
[place] for Warfarin Indication: [facility] JUDE AVR  No Known Allergies  Patient Measurements: Height: '5\' 10"'$  (177.8 cm) Weight: 195 lb (88.451 kg) IBW/kg (Calculated) : 73  Vital Signs: Temp: 98 F (36.7 C) (07/13 1809) Temp Source: Oral (07/13 1809) BP: 119/50 mmHg (07/13 1809) Pulse Rate: 60 (07/13 1809)  Labs:  Recent Labs  03/01/15 1332 03/01/15 1544  HGB  --  8.3*  HCT  --  26.7*  PLT  --  352  LABPROT  --  39.8*  INR  --  4.25*  CREATININE 1.90* 1.93*    Estimated Creatinine Clearance: 37.6 mL/min (by C-G formula based on Cr of 1.93).   Medications:  Prescriptions prior to admission  Medication Sig Dispense Refill Last Dose  . ACCU-CHEK AVIVA PLUS test strip    Taking  . albuterol (PROVENTIL) 4 MG tablet Take 4 mg by mouth 3 (three) times daily.   Taking  . albuterol (VOSPIRE ER) 4 MG 12 hr tablet Take 4 mg by mouth 2 (two) times daily.    Taking  . albuterol-ipratropium (COMBIVENT) 18-103 MCG/ACT inhaler Inhale 1 puff into the lungs 4 (four) times daily. Coughing/ Shortness of Breath   Taking  . allopurinol (ZYLOPRIM) 300 MG tablet Take 300 mg by mouth at bedtime.    Taking  . Ascorbic Acid (VITAMIN C WITH ROSE HIPS) 1000 MG tablet Take 1,000 mg by mouth daily.   Taking  . cholecalciferol (VITAMIN D) 1000 UNITS tablet Take 2,000 Units by mouth daily.   Taking  . clopidogrel (PLAVIX) 75 MG tablet Take 1 tablet (75 mg total) by mouth daily with breakfast. 30 tablet 11 Taking  . CRESTOR 20 MG tablet TAKE (1) TABLET BY MOUTH AT BEDTIME FOR CHOLESTEROL. 30 tablet 3 Taking  . Emollient (EUCERIN) lotion Apply 10 mLs topically as needed for dry skin.   Taking  . fenofibrate (TRICOR) 145 MG tablet TAKE 1 TABLET BY MOUTH ONCE DAILY FOR CHOLESTEROL. (Patient taking differently: TAKE 1 TABLET BY MOUTH every evening FOR CHOLESTEROL.) 30 tablet 3 Taking  . ferrous sulfate 325 (65 FE) MG tablet Take 1 tablet (325 mg total) by mouth daily with  breakfast. 30 tablet 1 Taking  . fish oil-omega-3 fatty acids 1000 MG capsule Take 1 capsule (1 g total) by mouth 2 (two) times daily.   Taking  . furosemide (LASIX) 80 MG tablet Take 1 tablet (80 mg total) by mouth daily.   Taking  . glimepiride (AMARYL) 1 MG tablet Take 1 mg by mouth 2 (two) times daily.   Taking  . isosorbide mononitrate (IMDUR) 30 MG 24 hr tablet TAKE ONE TABLET BY MOUTH ONCE DAILY. 30 tablet 5 Taking  . metoprolol succinate (TOPROL-XL) 50 MG 24 hr tablet Take 1 tablet (50 mg total) by mouth daily. Take with or immediately following a meal. 30 tablet 11 Taking  . nitroGLYCERIN (NITROSTAT) 0.4 MG SL tablet Place 1 tablet (0.4 mg total) under the tongue every 5 (five) minutes as needed for chest pain. 25 tablet 12 Taking  . oxyCODONE-acetaminophen (PERCOCET) 10-325 MG per tablet Take 1 tablet by mouth every 4 (four) hours as needed for pain. (Patient taking differently: Take 1 tablet by mouth 2 (two) times daily as needed for pain. ) 30 tablet 0 Taking  . potassium chloride SA (K-DUR,KLOR-CON) 20 MEQ tablet Take 20 mEq by mouth daily.     Taking  . pregabalin (LYRICA) 50 MG capsule Take one capsule by mouth twice daily  for pains 60 capsule 5 Taking  . ramipril (ALTACE) 2.5 MG capsule TAKE 1 CAPSULE BY MOUTH AT BEDTIME. 30 capsule 7 Taking  . sitaGLIPtin (JANUVIA) 50 MG tablet Take 50 mg by mouth daily.   Taking  . sucralfate (CARAFATE) 1 G tablet Take 1 g by mouth 2 (two) times daily.      [name] warfarin (COUMADIN) 5 MG tablet Take 1-1.5 tablets (5-7.5 mg total) by mouth at bedtime. Takes one-half tablet (7.'5mg'$  total) on Fridays. Takes one tablet ('5mg'$  total) on all other days (Patient taking differently: Take 2.5-5 mg by mouth at bedtime. Take 2.5 mg on Monday and Friday.  Take 5 mg all other days.) 30 tablet 11 Taking  . zinc gluconate 50 MG tablet Take 50 mg by mouth daily.   Taking   Assessment: Okay for Protocol, elevated INR on admission.  Hx multiple dose changes and fluctuating  INR.  Recent rectal bleeding reported by patient.  Colonoscopy planned later this month by [facility].  Goal of Therapy:  INR 2.5-3.5   Plan:  No warfarin today. Daily PT/INR.  Pricilla Larsson 03/01/2015,6:51 PM

## 2015-03-01 NOTE — ED Notes (Addendum)
Pt reports lower abdominal pain for last month. Pt reports increase in pain and reports rectal bleeding for last several days. Pt reports generalized weakness denies dizziness. Pt had CT completed today. Per radiology, scan showed ischemic enteritis.

## 2015-03-01 NOTE — H&P (Signed)
[facility] History and Physical  Tanner Pena QMG:[id number] DOB: 08-12-1941 DOA: 03/01/2015  Referring physician: ER, Dr. Vanita Panda PCP: [name], MD   Chief Complaint: Abdominal pain  HPI: Tanner Pena is a 74 y.o. male  This is a 74 year old man, diabetic, hypertensive, history of peripheral vascular disease and coronary artery disease with history of congestive heart failure now presents with abdominal pain which he is actually had for approximately one month. He is orally seen gastroenterology for this problem and consideration is being given to an ischemic colitis. He came in from his PCP today because of worsening abdominal pain associated with subjective fever and rectal bleeding. He cannot specify how often he has had rectal bleeding. He denies any diarrhea, nausea or vomiting. A CT angiogram 3 years ago was consistent with mesenteric ischemia. Evaluation in the emergency room with a CT scan of the abdomen shows the following findings: IMPRESSION: Abnormal thick walled loops of small intestine, probably within the ileum. 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 The differential diagnosis does include inflammatory bowel disease and infectious inflammation. He is now being admitted for further investigation and management.  Review of Systems:  Apart from symptoms above, all systems negative  Past Medical History  Diagnosis Date  . Diabetes mellitus 2007  . Hypertension   . Gout   . Hypercholesteremia   . Peripheral vascular disease     stents lower extremity  . COPD (chronic obstructive pulmonary disease)   . S/P aortic valve replacement 1990    a. [employer]  . Chronic back pain   . Dysphagia   . Neuromuscular disorder   . Peripheral neuropathy   . Critical lower limb ischemia   . CAD (coronary artery disease)     a. 05/13/14 [name] s/p overlapping DESx2 to SVG to RCA. b.  s/p CABG '90 with  redo '94 & stent to RCA SVG in 2005  . Chronic toe ulcer     a. left foot  . Shortness of breath   . Myocardial infarction   . CHF (congestive heart failure)   . Stone in kidney   . GERD (gastroesophageal reflux disease)   . Arthritis   . Sleep apnea     tested greater than 7 years ago per patient   Past Surgical History  Procedure Laterality Date  . Open heart surgery  1990    prosthetic heart valve, one bypass  . Rotator cuff repair      right  . Cataract extraction      bilateral  . Coronary stent placement  2005    RCA vein graft A 3.0x13.0 TAXUS stent was then placed int he vessel a Viva 3.0x4.0 (perfusion balloon was made ready it was placed through the entire lenght of the stent  . Peripheral vascular procedures lower extremities      right external iliac  artery PTA and stenting as well as bilateral SFA intervention remotely. Repeat procedures in 2011 bilaterally  . Coronary artery bypass graft  1994    6 vessels  . [name]  06/13/2011    Procedure: Venia Minks DILATION;  Surgeon: [name], MD;  Location: [facility];  Service: Endoscopy;  Laterality: N/A;  Dilated to 56.   . Angioplasty illiac artery    . Back surgery  3267,1245    2  . Eye surgery    . Amputation Left 07/13/2014    Procedure: Transmetatarsal Amputation;  Surgeon: Beverely Low  Fernanda Drum, MD;  Location: [place];  Service: Orthopedics;  Laterality: Left;  . Lower extremity angiogram N/A 02/15/2013    Procedure: LOWER EXTREMITY ANGIOGRAM;  Surgeon: [name], MD;  Location: [facility] CATH LAB;  Service: Cardiovascular;  Laterality: N/A;  . Left heart catheterization with coronary angiogram N/A 05/11/2014    Procedure: LEFT HEART CATHETERIZATION WITH CORONARY ANGIOGRAM;  Surgeon: [name], MD;  Location: [facility] CATH LAB;  Service: Cardiovascular;  Laterality: N/A;  . Percutaneous coronary stent intervention (pci-s) N/A 05/13/2014    Procedure: PERCUTANEOUS CORONARY STENT INTERVENTION (PCI-S);  Surgeon:  [name], MD;  Location: [facility] CATH LAB;  Service: Cardiovascular;  Laterality: N/A;  . Lower extremity angiogram N/A 06/06/2014    Procedure: LOWER EXTREMITY ANGIOGRAM;  Surgeon: [name], MD;  Location: [facility] CATH LAB;  Service: Cardiovascular;  Laterality: N/A;  . Amputation Left 08/20/2014    Procedure: Revision Transmetatarsal Amputation versus Below Knee Amputation;  Surgeon: [name], MD;  Location: [place];  Service: Orthopedics;  Laterality: Left;  . Stump revision Left 09/23/2014    Procedure: Revision Left Below Knee Amputation;  Surgeon: [name], MD;  Location: [place];  Service: Orthopedics;  Laterality: Left;  . Stump revision Left 10/13/2014    Procedure: REVISION LEFT BELOW KNEE AMPUTATION STUMP;  Surgeon: [name], MD;  Location: [facility];  Service: Orthopedics;  Laterality: Left;  . Esophagogastroduodenoscopy N/A 02/09/2015    DR. Schooler: Normal EGD   Social History:  reports that he quit smoking about 9 months ago. His smoking use included Cigarettes. He started smoking about 61 years ago. He has a 55 pack-year smoking history. His smokeless tobacco use includes Chew. He reports that he does not drink alcohol or use illicit drugs.  No Known Allergies  Family History  Problem Relation Age of Onset  . Colon cancer Neg Hx   . Liver disease Neg Hx     Prior to Admission medications   Medication Sig Start Date End Date Taking? Authorizing Provider  ACCU-CHEK AVIVA PLUS test strip  12/22/14   Historical Provider, MD  albuterol (PROVENTIL) 4 MG tablet Take 4 mg by mouth 3 (three) times daily. 01/20/15   Historical Provider, MD  albuterol (VOSPIRE ER) 4 MG 12 hr tablet Take 4 mg by mouth 2 (two) times daily.     Historical Provider, MD  albuterol-ipratropium (COMBIVENT) 18-103 MCG/ACT inhaler Inhale 1 puff into the lungs 4 (four) times daily. Coughing/ Shortness of Breath    Historical Provider, MD  allopurinol (ZYLOPRIM) 300 MG tablet Take 300 mg by  mouth at bedtime.     Historical Provider, MD  Ascorbic Acid (VITAMIN C WITH ROSE HIPS) 1000 MG tablet Take 1,000 mg by mouth daily.    Historical Provider, MD  cholecalciferol (VITAMIN D) 1000 UNITS tablet Take 2,000 Units by mouth daily.    Historical Provider, MD  clopidogrel (PLAVIX) 75 MG tablet Take 1 tablet (75 mg total) by mouth daily with breakfast. 05/17/14   Eileen Stanford, PA-C  CRESTOR 20 MG tablet TAKE (1) TABLET BY MOUTH AT BEDTIME FOR CHOLESTEROL. 01/19/15   [name], MD  Emollient (EUCERIN) lotion Apply 10 mLs topically as needed for dry skin.    Historical Provider, MD  fenofibrate (TRICOR) 145 MG tablet TAKE 1 TABLET BY MOUTH ONCE DAILY FOR CHOLESTEROL. Patient taking differently: TAKE 1 TABLET BY MOUTH every evening FOR CHOLESTEROL. 01/19/15   [name], MD  ferrous sulfate  325 (65 FE) MG tablet Take 1 tablet (325 mg total) by mouth daily with breakfast. 02/12/15   Delfina Redwood, MD  fish oil-omega-3 fatty acids 1000 MG capsule Take 1 capsule (1 g total) by mouth 2 (two) times daily. 01/22/13   Casimiro Needle Alvstad, RPH-CPP  furosemide (LASIX) 80 MG tablet Take 1 tablet (80 mg total) by mouth daily. 02/12/15   Delfina Redwood, MD  glimepiride (AMARYL) 1 MG tablet Take 1 mg by mouth 2 (two) times daily.    Historical Provider, MD  isosorbide mononitrate (IMDUR) 30 MG 24 hr tablet TAKE ONE TABLET BY MOUTH ONCE DAILY. 10/25/14   Troy Sine, MD  metoprolol succinate (TOPROL-XL) 50 MG 24 hr tablet Take 1 tablet (50 mg total) by mouth daily. Take with or immediately following a meal. 05/17/14   Eileen Stanford, PA-C  nitroGLYCERIN (NITROSTAT) 0.4 MG SL tablet Place 1 tablet (0.4 mg total) under the tongue every 5 (five) minutes as needed for chest pain. 05/17/14   Eileen Stanford, PA-C  oxyCODONE-acetaminophen (PERCOCET) 10-325 MG per tablet Take 1 tablet by mouth every 4 (four) hours as needed for pain. Patient taking differently: Take 1 tablet by mouth 2 (two)  times daily as needed for pain.  09/23/14   [name], MD  potassium chloride SA (K-DUR,KLOR-CON) 20 MEQ tablet Take 20 mEq by mouth daily.      Historical Provider, MD  pregabalin (LYRICA) 50 MG capsule Take one capsule by mouth twice daily for pains 08/23/14   Blanchie Serve, MD  ramipril (ALTACE) 2.5 MG capsule TAKE 1 CAPSULE BY MOUTH AT BEDTIME. 09/27/14   [name], MD  sitaGLIPtin (JANUVIA) 50 MG tablet Take 50 mg by mouth daily.    Historical Provider, MD  sucralfate (CARAFATE) 1 G tablet Take 1 g by mouth 2 (two) times daily.  02/15/15   Historical Provider, MD  warfarin (COUMADIN) 5 MG tablet Take 1-1.5 tablets (5-7.5 mg total) by mouth at bedtime. Takes one-half tablet (7.'5mg'$  total) on Fridays. Takes one tablet ('5mg'$  total) on all other days Patient taking differently: Take 2.5-5 mg by mouth at bedtime. Take 2.5 mg on Monday and Friday.  Take 5 mg all other days. 01/01/15   Belkys A Regalado, MD  zinc gluconate 50 MG tablet Take 50 mg by mouth daily.    Historical Provider, MD   Physical Exam: Filed Vitals:   03/01/15 1507 03/01/15 1530 03/01/15 1545  BP: 100/40 127/34   Pulse: 78  70  Temp: 97.9 F (36.6 C)    TempSrc: Oral    Resp: '17 23 23  '$ Height: '5\' 10"'$  (1.778 m)    Weight: 88.451 kg (195 lb)    SpO2: 100%  99%    Wt Readings from Last 3 Encounters:  03/01/15 88.451 kg (195 lb)  02/12/15 91.4 kg (201 lb 8 oz)  01/01/15 91.536 kg (201 lb 12.8 oz)    General:  Appears calm and comfortable. He is not toxic or septic. Eyes: PERRL, normal lids, irises & conjunctiva ENT: grossly normal hearing, lips & tongue Neck: no LAD, masses or thyromegaly Cardiovascular: Irregular. Telemetry: SR, no arrhythmias  Respiratory: CTA bilaterally, no w/r/r. Normal respiratory effort. Abdomen: Soft, tender in the generalized manner. Bowel sounds are scanty. Skin: no rash or induration seen on limited exam Musculoskeletal: grossly normal tone BUE/BLE Psychiatric: grossly normal mood and  affect, speech fluent and appropriate Neurologic: grossly non-focal.  Labs on Admission:  Basic Metabolic Panel:  Recent Labs Lab 03/01/15 1332 03/01/15 1544  NA  --  134*  K  --  4.2  CL  --  101  CO2  --  22  GLUCOSE  --  94  BUN  --  63*  CREATININE 1.90* 1.93*  CALCIUM  --  8.6*   Liver Function Tests:  Recent Labs Lab 03/01/15 1544  AST 25  ALT 13*  ALKPHOS 34*  BILITOT 0.6  PROT 6.8  ALBUMIN 3.1*   No results for input(s): LIPASE, AMYLASE in the last 168 hours. No results for input(s): AMMONIA in the last 168 hours. CBC:  Recent Labs Lab 03/01/15 1544  WBC 17.5*  HGB 8.3*  HCT 26.7*  MCV 73.2*  PLT 352   Cardiac Enzymes: No results for input(s): CKTOTAL, CKMB, CKMBINDEX, TROPONINI in the last 168 hours.  BNP (last 3 results)  Recent Labs  12/28/14 0134 02/04/15 0413  BNP 504.3* 593.2*    ProBNP (last 3 results)  Recent Labs  05/10/14 0229 05/11/14 1530  PROBNP 820.3* 1302.0*    CBG: No results for input(s): GLUCAP in the last 168 hours.  Radiological Exams on Admission: Ct Abdomen Pelvis W Contrast  03/01/2015   CLINICAL DATA:  Generalized abdominal pain, several month duration. Diarrhea.  EXAM: CT ABDOMEN AND PELVIS WITH CONTRAST  TECHNIQUE: Multidetector CT imaging of the abdomen and pelvis was performed using the standard protocol following bolus administration of intravenous contrast.  CONTRAST:  22m OMNIPAQUE IOHEXOL 300 MG/ML  SOLN  COMPARISON:  02/05/2015  FINDINGS: There is chronic pleural and parenchymal scarring at the left base with calcification. No active chest disease is identified.  There is considerable motion degradation in the upper abdomen. The left lobe of the liver is prominent and there is slight lobulation of the right lobe of the liver raising the possibility of early cirrhosis. There may be some sludge or noncalcified stones dependent in the gallbladder. The spleen is normal. The pancreas is normal. The  adrenal glands are normal. No renal parenchymal lesion is seen. There is atrophy at the lower pole the right kidney. There is a punctate stone in the upper pole the right kidney. The kidneys do not appear to be excreting contrast, 3 minutes after injection. There is advanced atherosclerosis of the aorta and its branch vessels. The IVC is normal.  There are several small bowel loops probably in the ileum that show wall thickening. Ischemic changes a concern given the degree of vascular disease. Inflammatory bowel disease or infectious inflammation could have a similar appearance. No perforation. No free fluid. The colon appears normal today.  The bladder is thick walled. The prostate is slightly enlarged. No pelvic lymphadenopathy. There are extensive chronic degenerative changes of the spine. There is a small ventral hernia containing only fat.  IMPRESSION: Abnormal thick walled loops of small intestine, probably within the ileum. 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 The differential diagnosis does include inflammatory bowel disease and infectious inflammation.  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  Poor excretion of contrast 3 minutes after injection suggesting the possibility of severely impaired renal function.  These results will be called to the ordering clinician or representative by the Radiologist Assistant, and communication documented in the PACS or zVision Dashboard.   Electronically Signed   By: MNelson ChimesM.D.   On: 03/01/2015 14:43    EKG: Independently reviewed. Ventricular bigeminy.  Assessment/Plan   1.  Enteritis. Although the white count is raised, and she will be treated empirically with antibiotics, I wonder if this is an ischemic problem. He will be given adequate intravenous hydration. He is adequately anticoagulated with warfarin. I will request gastroenterology consultation. 2. Hypertension. Stable. 3. Diabetes. Continue with  home medications and sliding scale of insulin. 4. Coronary artery disease. Appears to be stable.  Further recommendations will depend on patient's hospital progress.   Code Status: Full code.  DVT Prophylaxis: Warfarin.  Family Communication: I discussed the plan with the patient at the bedside.   Disposition Plan: Home when medically stable.   Time spent: 60 minutes.  Doree Albee [facility] Pager [phone].

## 2015-03-02 ENCOUNTER — Encounter (HOSPITAL_COMMUNITY): Payer: Self-pay | Admitting: Gastroenterology

## 2015-03-02 ENCOUNTER — Telehealth: Payer: Self-pay

## 2015-03-02 ENCOUNTER — Inpatient Hospital Stay (HOSPITAL_COMMUNITY): Payer: Medicare Other

## 2015-03-02 DIAGNOSIS — K529 Noninfective gastroenteritis and colitis, unspecified: Secondary | ICD-10-CM

## 2015-03-02 DIAGNOSIS — D509 Iron deficiency anemia, unspecified: Secondary | ICD-10-CM | POA: Diagnosis present

## 2015-03-02 DIAGNOSIS — D62 Acute posthemorrhagic anemia: Secondary | ICD-10-CM | POA: Diagnosis present

## 2015-03-02 DIAGNOSIS — R791 Abnormal coagulation profile: Secondary | ICD-10-CM | POA: Diagnosis present

## 2015-03-02 DIAGNOSIS — Z7901 Long term (current) use of anticoagulants: Secondary | ICD-10-CM | POA: Diagnosis present

## 2015-03-02 DIAGNOSIS — J438 Other emphysema: Secondary | ICD-10-CM

## 2015-03-02 DIAGNOSIS — K551 Chronic vascular disorders of intestine: Secondary | ICD-10-CM | POA: Diagnosis present

## 2015-03-02 LAB — CBC WITH DIFFERENTIAL/PLATELET
BASOS: 0 %
Basophils Absolute: 0 10*3/uL (ref 0.0–0.2)
EOS (ABSOLUTE): 0 10*3/uL (ref 0.0–0.4)
Eos: 0 %
HEMATOCRIT: 25.7 % — AB (ref 37.5–51.0)
Hemoglobin: 7.4 g/dL — ABNORMAL LOW (ref 12.6–17.7)
Immature Grans (Abs): 0 10*3/uL (ref 0.0–0.1)
Immature Granulocytes: 0 %
LYMPHS: 4 %
Lymphocytes Absolute: 1 10*3/uL (ref 0.7–3.1)
MCH: 22 pg — AB (ref 26.6–33.0)
MCHC: 28.8 g/dL — ABNORMAL LOW (ref 31.5–35.7)
MCV: 77 fL — ABNORMAL LOW (ref 79–97)
MONOS ABS: 0.7 10*3/uL (ref 0.1–0.9)
Monocytes: 3 %
NEUTROS PCT: 93 %
Neutrophils Absolute: 22.1 10*3/uL — ABNORMAL HIGH (ref 1.4–7.0)
Platelets: 324 10*3/uL (ref 150–379)
RBC: 3.36 x10E6/uL — AB (ref 4.14–5.80)
RDW: 22.1 % — ABNORMAL HIGH (ref 12.3–15.4)
WBC: 23.9 10*3/uL — AB (ref 3.4–10.8)

## 2015-03-02 LAB — CBC
HCT: 23.1 % — ABNORMAL LOW (ref 39.0–52.0)
HEMOGLOBIN: 7.1 g/dL — AB (ref 13.0–17.0)
MCH: 22.5 pg — AB (ref 26.0–34.0)
MCHC: 30.7 g/dL (ref 30.0–36.0)
MCV: 73.3 fL — ABNORMAL LOW (ref 78.0–100.0)
PLATELETS: 306 10*3/uL (ref 150–400)
RBC: 3.15 MIL/uL — ABNORMAL LOW (ref 4.22–5.81)
RDW: 23 % — ABNORMAL HIGH (ref 11.5–15.5)
WBC: 12.6 10*3/uL — ABNORMAL HIGH (ref 4.0–10.5)

## 2015-03-02 LAB — COMPREHENSIVE METABOLIC PANEL
ALK PHOS: 28 U/L — AB (ref 38–126)
ALT: 12 U/L — AB (ref 17–63)
AST: 18 U/L (ref 15–41)
Albumin: 2.6 g/dL — ABNORMAL LOW (ref 3.5–5.0)
Anion gap: 9 (ref 5–15)
BUN: 56 mg/dL — AB (ref 6–20)
CALCIUM: 8.4 mg/dL — AB (ref 8.9–10.3)
CO2: 22 mmol/L (ref 22–32)
CREATININE: 1.47 mg/dL — AB (ref 0.61–1.24)
Chloride: 107 mmol/L (ref 101–111)
GFR, EST AFRICAN AMERICAN: 52 mL/min — AB (ref >60)
GFR, EST NON AFRICAN AMERICAN: 45 mL/min — AB (ref >60)
Glucose, Bld: 54 mg/dL — ABNORMAL LOW (ref 65–99)
Potassium: 3.6 mmol/L (ref 3.5–5.1)
Sodium: 138 mmol/L (ref 135–145)
Total Bilirubin: 0.5 mg/dL (ref 0.3–1.2)
Total Protein: 5.7 g/dL — ABNORMAL LOW (ref 6.5–8.1)

## 2015-03-02 LAB — ABO/RH: ABO/RH(D): A POS

## 2015-03-02 LAB — PREPARE RBC (CROSSMATCH)

## 2015-03-02 LAB — GLUCOSE, CAPILLARY
GLUCOSE-CAPILLARY: 55 mg/dL — AB (ref 65–99)
GLUCOSE-CAPILLARY: 113 mg/dL — AB (ref 65–99)
GLUCOSE-CAPILLARY: 120 mg/dL — AB (ref 65–99)
Glucose-Capillary: 123 mg/dL — ABNORMAL HIGH (ref 65–99)

## 2015-03-02 LAB — PROTIME-INR
INR: 4.69 — ABNORMAL HIGH (ref 0.00–1.49)
Prothrombin Time: 42.8 seconds — ABNORMAL HIGH (ref 11.6–15.2)

## 2015-03-02 MED ORDER — SODIUM CHLORIDE 0.9 % IV SOLN
Freq: Once | INTRAVENOUS | Status: AC
  Administered 2015-03-02: 16:00:00 via INTRAVENOUS

## 2015-03-02 MED ORDER — CIPROFLOXACIN IN D5W 200 MG/100ML IV SOLN
INTRAVENOUS | Status: AC
  Filled 2015-03-02: qty 100

## 2015-03-02 MED ORDER — IPRATROPIUM-ALBUTEROL 0.5-2.5 (3) MG/3ML IN SOLN
3.0000 mL | Freq: Three times a day (TID) | RESPIRATORY_TRACT | Status: DC
  Administered 2015-03-02 – 2015-03-03 (×4): 3 mL via RESPIRATORY_TRACT
  Filled 2015-03-02 (×4): qty 3

## 2015-03-02 MED ORDER — IOHEXOL 350 MG/ML SOLN
100.0000 mL | Freq: Once | INTRAVENOUS | Status: AC | PRN
  Administered 2015-03-02: 100 mL via INTRAVENOUS

## 2015-03-02 NOTE — Telephone Encounter (Signed)
[facility] results for CBC/ CMP place on [facility] desk

## 2015-03-02 NOTE — Care Management Note (Signed)
Case Management Note  Patient Details  Name: Tanner Pena MRN: [id number] Date of Birth: [birth year]  Expected Discharge Date:  03/05/15               Expected Discharge Plan:  Home/Self Care  In-House Referral:  NA  Discharge planning Services  CM Consult  Post Acute Care Choice:  NA Choice offered to:  NA  DME Arranged:    DME Agency:     HH Arranged:    HH Agency:     Status of Service:  In process, will continue to follow  Medicare Important Message Given:    Date Medicare IM Given:    Medicare IM give by:    Date Additional Medicare IM Given:    Additional Medicare Important Message give by:     If discussed at [place] of Stay Meetings, dates discussed:    Additional Comments: Pt is from home with self care. Pt independent with ADL's. Pt has L LE amputation was is scheduled for OP therapy to begin working with his prothesis next Tuesday in the OP rehab facility in [place]. Pt says he will call and reschedule appointment if he is still in the hospital on Monday. Pt has a cane, walker, wheelchair, crutches and BSC at home. Pt plans to return home with self care at DC. No CM needs anticipated, will cont to follow for discharge planning.  Sherald Barge, RN 03/02/2015, 1:28 PM

## 2015-03-02 NOTE — Progress Notes (Signed)
I spoke with patient's daughter, [name] regarding need for CTA abdomen. She confirmed that patient reported melena, for 2-3 days. None since in hospital. Has not bee on iron in over 3 weeks, was unable to tolerate it due to worsening abdominal pain. This will make his 3rd unit of prbcs since 01/2015.   May need to consider holding coumadin and use heparin when INR drop pending status of work up and intervention that may be necessary.

## 2015-03-02 NOTE — Telephone Encounter (Signed)
Noted  

## 2015-03-02 NOTE — Progress Notes (Addendum)
[place] for Warfarin Indication: ST JUDE AVR  No Known Allergies  Patient Measurements: Height: '5\' 10"'$  (177.8 cm) Weight: 199 lb 8.3 oz (90.5 kg) IBW/kg (Calculated) : 73  Vital Signs: Temp: 97.8 F (36.6 C) (07/14 0601) Temp Source: Oral (07/14 0601) BP: 130/60 mmHg (07/14 0601) Pulse Rate: 60 (07/14 0601)  Labs:  Recent Labs  03/01/15 1332 03/01/15 1544 03/02/15 0609  HGB  --  8.3* 7.1*  HCT  --  26.7* 23.1*  PLT  --  352 306  LABPROT  --  39.8* 42.8*  INR  --  4.25* 4.69*  CREATININE 1.90* 1.93* 1.47*    Estimated Creatinine Clearance: 49.9 mL/min (by C-G formula based on Cr of 1.47).   Medications:  Prescriptions prior to admission  Medication Sig Dispense Refill Last Dose  . ACCU-CHEK AVIVA PLUS test strip    03/01/2015 at Unknown time  . albuterol (PROVENTIL) 4 MG tablet Take 4 mg by mouth 3 (three) times daily.   03/01/2015 at Unknown time  . albuterol (VOSPIRE ER) 4 MG 12 hr tablet Take 4 mg by mouth 2 (two) times daily.    03/01/2015 at Unknown time  . albuterol-ipratropium (COMBIVENT) 18-103 MCG/ACT inhaler Inhale 1 puff into the lungs 4 (four) times daily. Coughing/ Shortness of Breath   03/01/2015 at Unknown time  . allopurinol (ZYLOPRIM) 300 MG tablet Take 300 mg by mouth at bedtime.    03/01/2015 at Unknown time  . Ascorbic Acid (VITAMIN C WITH ROSE HIPS) 1000 MG tablet Take 1,000 mg by mouth daily.   03/01/2015 at Unknown time  . cholecalciferol (VITAMIN D) 1000 UNITS tablet Take 2,000 Units by mouth daily.   03/01/2015 at Unknown time  . clopidogrel (PLAVIX) 75 MG tablet Take 1 tablet (75 mg total) by mouth daily with breakfast. 30 tablet 11 03/01/2015 at Unknown time  . CRESTOR 20 MG tablet TAKE (1) TABLET BY MOUTH AT BEDTIME FOR CHOLESTEROL. 30 tablet 3 03/01/2015 at Unknown time  . Emollient (EUCERIN) lotion Apply 10 mLs topically as needed for dry skin.   03/01/2015 at Unknown time  . fenofibrate (TRICOR) 145 MG tablet  TAKE 1 TABLET BY MOUTH ONCE DAILY FOR CHOLESTEROL. (Patient taking differently: TAKE 1 TABLET BY MOUTH every evening FOR CHOLESTEROL.) 30 tablet 3 03/01/2015 at Unknown time  . ferrous sulfate 325 (65 FE) MG tablet Take 1 tablet (325 mg total) by mouth daily with breakfast. 30 tablet 1 03/01/2015 at Unknown time  . fish oil-omega-3 fatty acids 1000 MG capsule Take 1 capsule (1 g total) by mouth 2 (two) times daily.   03/01/2015 at Unknown time  . furosemide (LASIX) 80 MG tablet Take 1 tablet (80 mg total) by mouth daily.   03/01/2015 at Unknown time  . glimepiride (AMARYL) 1 MG tablet Take 1 mg by mouth 2 (two) times daily.   03/01/2015 at Unknown time  . isosorbide mononitrate (IMDUR) 30 MG 24 hr tablet TAKE ONE TABLET BY MOUTH ONCE DAILY. 30 tablet 5 03/01/2015 at Unknown time  . metoprolol succinate (TOPROL-XL) 50 MG 24 hr tablet Take 1 tablet (50 mg total) by mouth daily. Take with or immediately following a meal. 30 tablet 11 03/01/2015 at Unknown time  . nitroGLYCERIN (NITROSTAT) 0.4 MG SL tablet Place 1 tablet (0.4 mg total) under the tongue every 5 (five) minutes as needed for chest pain. 25 tablet 12 Unknown  . oxyCODONE-acetaminophen (PERCOCET) 10-325 MG per tablet Take 1 tablet by mouth every 4 (  four) hours as needed for pain. (Patient taking differently: Take 1 tablet by mouth 2 (two) times daily as needed for pain. ) 30 tablet 0 03/01/2015 at Unknown time  . potassium chloride SA (K-DUR,KLOR-CON) 20 MEQ tablet Take 20 mEq by mouth daily.     03/01/2015 at Unknown time  . pregabalin (LYRICA) 50 MG capsule Take one capsule by mouth twice daily for pains 60 capsule 5 03/01/2015 at Unknown time  . ramipril (ALTACE) 2.5 MG capsule TAKE 1 CAPSULE BY MOUTH AT BEDTIME. 30 capsule 7 03/01/2015 at Unknown time  . sitaGLIPtin (JANUVIA) 50 MG tablet Take 50 mg by mouth daily.   03/01/2015 at Unknown time  . sucralfate (CARAFATE) 1 G tablet Take 1 g by mouth 2 (two) times daily.    03/01/2015 at Unknown time  .  warfarin (COUMADIN) 5 MG tablet Take 1-1.5 tablets (5-7.5 mg total) by mouth at bedtime. Takes one-half tablet (7.'5mg'$  total) on Fridays. Takes one tablet ('5mg'$  total) on all other days (Patient taking differently: Take 2.5-5 mg by mouth at bedtime. Take 2.5 mg on Monday and Friday.  Take 5 mg all other days.) 30 tablet 11 03/01/2015 at Unknown time  . zinc gluconate 50 MG tablet Take 50 mg by mouth daily.   03/01/2015 at Unknown time   Assessment: 74 yo M on chronic Coumadin for AVR.  Home dose listed above.  INR was elevated on admission & continues to rise despite holding dose yesterday.  Hx multiple dose changes and fluctuating INR as outpatient.  He is also on Cipro & Flagyl this admission which can interact to elevate INR.  Recent rectal bleeding reported by patient- he is bring followed by GI this admission.  No active bleeding noted at this time.    Goal of Therapy:  INR 2.5-3.5   Plan:  No warfarin today Daily PT/INR  Biagio Borg 03/02/2015,11:07 AM

## 2015-03-02 NOTE — Consult Note (Addendum)
Referring Provider: Caren Griffins, MD Primary Care Physician:  [name], MD Primary Gastroenterologist:  Garfield Cornea, MD  Reason for Consultation:  Abdominal pain, ?ischemic enteritis  HPI: Tanner Pena is a 74 y.o. male with PMH significant for DM, HTN, PVD/CAD, CHF, prosthetic heart valve who presents with one month history of abdominal pain. Recently seen in our office for hospital follow up from 01/2015. Hospitalized at [facility] in 01/2015 for respiratory failure and acute on chronic systolic/diastolic heart failure. During admission drop in Hgb and reported melena per patient although heme negative. Labs c/w IDA. EGD with MAC by Dr. Michail Sermon was normal. CT 02/05/15 WITHOUT contrast showed wall thickening of cecum without significant surrounding inflammation which may resent combination of underdistention and/or possible focal colitis. Received at least one unit of blood that admission.   In 2012, patient underwent routine screening colonoscopy. Noted to have ascending colonic ulcers most c/w ischemic ulcers. CTA performed showing "high probability of significant arterial occlusive disease to result in mesenteric ischemia and ischemic ulcers of the colon. Extensive vascular calcifications make the examination difficult to interpret. 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 Revascularization may provide relief of symptoms. Significant disease involving the celiac and IMA may also be present." At the time study was discussed with Dr. Alvester Chou and conservative measures felt to be appropriate as patient was asymptomatic at that time.   Patient presented with lower abdominal pain which sometimes radiates up to epigastric region for past one month. Intermittently throughout each day. Hurts with and without meals. Associated with anorexia, nausea, unclear how much weight loss. He weighed 224 pounds in 2012. 219 pounds in 05/2014 prior to BKA, 195 currently. BM regular. No melena,  brbpr. Denies NSAIDS. Current CT with contrast shows some sludge or noncalcified gallstones, ?early cirrhosis, advanced atherosclerosis of the aorta and its branch vessels. Several small bowel loops probably in ileum with wall thickening, ?related to ischemia. Poor excretion of contrast 3 minutes after injection suggesting possibility of severely impaired renal function, noted as well. Denies heartburn, rarely vomits. No PPI.   Reviewed current contrasted CT with Dr. Sabino Dick, radiologist. 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    Prior to Admission medications   Medication Sig Start Date End Date Taking? Authorizing Provider  ACCU-CHEK AVIVA PLUS test strip  12/22/14   Historical Provider, MD  albuterol (PROVENTIL) 4 MG tablet Take 4 mg by mouth 3 (three) times daily. 01/20/15   Historical Provider, MD  albuterol (VOSPIRE ER) 4 MG 12 hr tablet Take 4 mg by mouth 2 (two) times daily.     Historical Provider, MD  albuterol-ipratropium (COMBIVENT) 18-103 MCG/ACT inhaler Inhale 1 puff into the lungs 4 (four) times daily. Coughing/ Shortness of Breath    Historical Provider, MD  allopurinol (ZYLOPRIM) 300 MG tablet Take 300 mg by mouth at bedtime.     Historical Provider, MD  Ascorbic Acid (VITAMIN C WITH ROSE HIPS) 1000 MG tablet Take 1,000 mg by mouth daily.    Historical Provider, MD  cholecalciferol (VITAMIN D) 1000 UNITS tablet Take 2,000 Units by mouth daily.    Historical Provider, MD  clopidogrel (PLAVIX) 75 MG tablet Take 1 tablet (75 mg total) by mouth daily with breakfast. 05/17/14   Eileen Stanford, PA-C  CRESTOR 20 MG tablet TAKE (1) TABLET BY MOUTH AT BEDTIME FOR CHOLESTEROL. 01/19/15   [name], MD  Emollient (EUCERIN) lotion Apply 10 mLs topically as needed for  dry skin.    Historical Provider, MD  fenofibrate (TRICOR) 145 MG tablet TAKE 1 TABLET BY MOUTH ONCE DAILY FOR CHOLESTEROL. Patient taking differently:  TAKE 1 TABLET BY MOUTH every evening FOR CHOLESTEROL. 01/19/15   [name], MD  ferrous sulfate 325 (65 FE) MG tablet Take 1 tablet (325 mg total) by mouth daily with breakfast. 02/12/15   Delfina Redwood, MD  fish oil-omega-3 fatty acids 1000 MG capsule Take 1 capsule (1 g total) by mouth 2 (two) times daily. 01/22/13   Casimiro Needle Alvstad, RPH-CPP  furosemide (LASIX) 80 MG tablet Take 1 tablet (80 mg total) by mouth daily. 02/12/15   Delfina Redwood, MD  glimepiride (AMARYL) 1 MG tablet Take 1 mg by mouth 2 (two) times daily.    Historical Provider, MD  isosorbide mononitrate (IMDUR) 30 MG 24 hr tablet TAKE ONE TABLET BY MOUTH ONCE DAILY. 10/25/14   Troy Sine, MD  metoprolol succinate (TOPROL-XL) 50 MG 24 hr tablet Take 1 tablet (50 mg total) by mouth daily. Take with or immediately following a meal. 05/17/14   Eileen Stanford, PA-C  nitroGLYCERIN (NITROSTAT) 0.4 MG SL tablet Place 1 tablet (0.4 mg total) under the tongue every 5 (five) minutes as needed for chest pain. 05/17/14   Eileen Stanford, PA-C  oxyCODONE-acetaminophen (PERCOCET) 10-325 MG per tablet Take 1 tablet by mouth every 4 (four) hours as needed for pain. Patient taking differently: Take 1 tablet by mouth 2 (two) times daily as needed for pain.  09/23/14   [name], MD  potassium chloride SA (K-DUR,KLOR-CON) 20 MEQ tablet Take 20 mEq by mouth daily.      Historical Provider, MD  pregabalin (LYRICA) 50 MG capsule Take one capsule by mouth twice daily for pains 08/23/14   Blanchie Serve, MD  ramipril (ALTACE) 2.5 MG capsule TAKE 1 CAPSULE BY MOUTH AT BEDTIME. 09/27/14   [name], MD  sitaGLIPtin (JANUVIA) 50 MG tablet Take 50 mg by mouth daily.    Historical Provider, MD  sucralfate (CARAFATE) 1 G tablet Take 1 g by mouth 2 (two) times daily.  02/15/15   Historical Provider, MD  warfarin (COUMADIN) 5 MG tablet Take 1-1.5 tablets (5-7.5 mg total) by mouth at bedtime. Takes one-half tablet (7.'5mg'$  total) on Fridays. Takes  one tablet ('5mg'$  total) on all other days Patient taking differently: Take 2.5-5 mg by mouth at bedtime. Take 2.5 mg on Monday and Friday.  Take 5 mg all other days. 01/01/15   Belkys A Regalado, MD  zinc gluconate 50 MG tablet Take 50 mg by mouth daily.    Historical Provider, MD    Current Facility-Administered Medications  Medication Dose Route Frequency Provider Last Rate Last Dose  . 0.9 %  sodium chloride infusion   Intravenous Continuous Carmin Muskrat, MD 125 mL/hr at 03/02/15 0046    . albuterol (VOSPIRE ER) 12 hr tablet 4 mg  4 mg Oral BID Nimish C Anastasio Champion, MD   4 mg at 03/01/15 2300  . allopurinol (ZYLOPRIM) tablet 300 mg  300 mg Oral QHS Nimish C Gosrani, MD   300 mg at 03/01/15 2250  . cholecalciferol (VITAMIN D) tablet 2,000 Units  2,000 Units Oral Daily Nimish C Gosrani, MD      . ciprofloxacin (CIPRO) IVPB 200 mg  200 mg Intravenous Q12H Nimish C Gosrani, MD   200 mg at 03/02/15 0534  . clopidogrel (PLAVIX) tablet 75 mg  75 mg Oral Q breakfast Nimish  Luther Parody, MD      . dextrose (GLUTOSE) 40 % oral gel           . fenofibrate tablet 160 mg  160 mg Oral Daily Nimish C Gosrani, MD      . ferrous sulfate tablet 325 mg  325 mg Oral Q breakfast Nimish C Gosrani, MD      . furosemide (LASIX) tablet 40 mg  40 mg Oral Daily Nimish C Gosrani, MD      . glimepiride (AMARYL) tablet 1 mg  1 mg Oral BID Nimish C Anastasio Champion, MD   1 mg at 03/01/15 2200  . insulin aspart (novoLOG) injection 0-5 Units  0-5 Units Subcutaneous QHS Doree Albee, MD   0 Units at 03/01/15 2200  . insulin aspart (novoLOG) injection 0-9 Units  0-9 Units Subcutaneous TID WC Nimish C Gosrani, MD      . ipratropium-albuterol (DUONEB) 0.5-2.5 (3) MG/3ML nebulizer solution 3 mL  3 mL Inhalation QID Doree Albee, MD   3 mL at 03/02/15 0755  . isosorbide mononitrate (IMDUR) 24 hr tablet 30 mg  30 mg Oral Daily Nimish C Gosrani, MD      . linagliptin (TRADJENTA) tablet 5 mg  5 mg Oral Daily Nimish C Gosrani, MD      .  metoprolol succinate (TOPROL-XL) 24 hr tablet 50 mg  50 mg Oral Daily Nimish C Gosrani, MD      . metroNIDAZOLE (FLAGYL) IVPB 500 mg  500 mg Intravenous Q8H Nimish C Gosrani, MD   500 mg at 03/02/15 0046  . nitroGLYCERIN (NITROSTAT) SL tablet 0.4 mg  0.4 mg Sublingual Q5 min PRN Nimish C Gosrani, MD      . omega-3 acid ethyl esters (LOVAZA) capsule 1 g  1 g Oral BID Nimish C Anastasio Champion, MD   1 g at 03/01/15 2250  . ondansetron (ZOFRAN) tablet 4 mg  4 mg Oral Q6H PRN Nimish Luther Parody, MD       Or  . ondansetron (ZOFRAN) injection 4 mg  4 mg Intravenous Q6H PRN Nimish Luther Parody, MD      . oxyCODONE-acetaminophen (PERCOCET/ROXICET) 5-325 MG per tablet 1 tablet  1 tablet Oral Q4H PRN Nimish Luther Parody, MD       And  . oxyCODONE (Oxy IR/ROXICODONE) immediate release tablet 5 mg  5 mg Oral Q4H PRN Nimish C Gosrani, MD      . potassium chloride SA (K-DUR,KLOR-CON) CR tablet 20 mEq  20 mEq Oral Daily Nimish C Gosrani, MD      . pregabalin (LYRICA) capsule 50 mg  50 mg Oral BID Nimish C Anastasio Champion, MD   50 mg at 03/01/15 2250  . ramipril (ALTACE) capsule 2.5 mg  2.5 mg Oral Daily Nimish C Gosrani, MD      . rosuvastatin (CRESTOR) tablet 20 mg  20 mg Oral q1800 Nimish C Gosrani, MD      . sodium chloride 0.9 % injection 3 mL  3 mL Intravenous Q12H Nimish C Gosrani, MD   3 mL at 03/01/15 2200  . sucralfate (CARAFATE) tablet 1 g  1 g Oral BID Nimish C Anastasio Champion, MD   1 g at 03/01/15 2250  . vitamin C (ASCORBIC ACID) tablet 1,000 mg  1,000 mg Oral Daily Nimish Luther Parody, MD      . Warfarin - Pharmacist Dosing Inpatient   Does not apply Q24H Doree Albee, MD        Allergies as of 03/01/2015  . (  No Known Allergies)    Past Medical History  Diagnosis Date  . Diabetes mellitus 2007  . Hypertension   . Gout   . Hypercholesteremia   . Peripheral vascular disease     stents lower extremity  . COPD (chronic obstructive pulmonary disease)   . S/P aortic valve replacement 1990    a. [employer]  . Chronic back  pain   . Dysphagia   . Neuromuscular disorder   . Peripheral neuropathy   . Critical lower limb ischemia   . CAD (coronary artery disease)     a. 05/13/14 [name] s/p overlapping DESx2 to SVG to RCA. b.  s/p CABG '90 with redo '94 & stent to RCA SVG in 2005  . Chronic toe ulcer     a. left foot  . Shortness of breath   . Myocardial infarction   . CHF (congestive heart failure)   . Stone in kidney   . GERD (gastroesophageal reflux disease)   . Arthritis   . Sleep apnea     tested greater than 7 years ago per patient    Past Surgical History  Procedure Laterality Date  . Open heart surgery  1990    prosthetic heart valve, one bypass  . Rotator cuff repair      right  . Cataract extraction      bilateral  . Coronary stent placement  2005    RCA vein graft A 3.0x13.0 TAXUS stent was then placed int he vessel a Viva 3.0x4.0 (perfusion balloon was made ready it was placed through the entire lenght of the stent  . Peripheral vascular procedures lower extremities      right external iliac  artery PTA and stenting as well as bilateral SFA intervention remotely. Repeat procedures in 2011 bilaterally  . Coronary artery bypass graft  1994    6 vessels  . [name]  06/13/2011    Procedure: Venia Minks DILATION;  Surgeon: [name], MD;  Location: [facility];  Service: Endoscopy;  Laterality: N/A;  Dilated to 56.   . Angioplasty illiac artery    . Back surgery  8527,[id number]    2  . Eye surgery    . Amputation Left 07/13/2014    Procedure: Transmetatarsal Amputation;  Surgeon: [name], MD;  Location: [place];  Service: Orthopedics;  Laterality: Left;  . Lower extremity angiogram N/A 02/15/2013    Procedure: LOWER EXTREMITY ANGIOGRAM;  Surgeon: [name], MD;  Location: [facility] CATH LAB;  Service: Cardiovascular;  Laterality: N/A;  . Left heart catheterization with coronary angiogram N/A 05/11/2014    Procedure: LEFT HEART CATHETERIZATION WITH CORONARY ANGIOGRAM;  Surgeon: [name], MD;  Location: [facility] CATH LAB;  Service: Cardiovascular;  Laterality: N/A;  . Percutaneous coronary stent intervention (pci-s) N/A 05/13/2014    Procedure: PERCUTANEOUS CORONARY STENT INTERVENTION (PCI-S);  Surgeon: [name], MD;  Location: [facility] CATH LAB;  Service: Cardiovascular;  Laterality: N/A;  . Lower extremity angiogram N/A 06/06/2014    Procedure: LOWER EXTREMITY ANGIOGRAM;  Surgeon: [name], MD;  Location: [facility] CATH LAB;  Service: Cardiovascular;  Laterality: N/A;  . Amputation Left 08/20/2014    Procedure: Revision Transmetatarsal Amputation versus Below Knee Amputation;  Surgeon: [name], MD;  Location: [place];  Service: Orthopedics;  Laterality: Left;  . Stump revision Left 09/23/2014    Procedure: Revision Left Below Knee Amputation;  Surgeon: [name], MD;  Location: [place];  Service: Orthopedics;  Laterality:  Left;  . Stump revision Left 10/13/2014    Procedure: REVISION LEFT BELOW KNEE AMPUTATION STUMP;  Surgeon: [name], MD;  Location: [facility];  Service: Orthopedics;  Laterality: Left;  . Esophagogastroduodenoscopy N/A 02/09/2015    DR. Schooler: Normal EGD  . Esophagogastroduodenoscopy  05/2011    Dr. [name]: s/p esophageal dilation, antral erosions/nodularity with benign biopsies  . Colonoscopy  05/2011    Dr. [name]: benign rectal polyp, left sided tics, ascending colonic ulcers (path c/w ischemia)    Family History  Problem Relation Age of Onset  . Colon cancer Neg Hx   . Liver disease Neg Hx     History   Social History  . Marital Status: Legally Separated    Spouse Name: N/A  . Number of Children: 5  . Years of Education: N/A   Occupational History  . retired     Stage manager   Social History Main Topics  . Smoking status: Former Smoker -- 1.00 packs/day for 55 years    Types: Cigarettes    Start date: 08/19/1953    Quit date: 05/07/2014  . Smokeless tobacco: Current User    Types: Chew    Last Attempt to Quit:  08/20/1995     Comment: Has quit on 3 occasions. Counseling given today 5-10 minutes   I am more than likely going to quit "  . Alcohol Use: No     Comment: socially, sometimes 12 ounce beer daily, may go month without/no whiskey  . Drug Use: No  . Sexual Activity: Not on file   Other Topics Concern  . Not on file   Social History Narrative   Has 3 daughters   Has 2 sons     ROS:  General: see hpi Eyes: Negative for vision changes.  ENT: Negative for hoarseness, difficulty swallowing , nasal congestion. CV: Negative for chest pain, angina, palpitations, peripheral edema. +DOE Respiratory: Negative for  dyspnea on exertion, cough, sputum, wheezing. +DOE.  GI: See history of present illness. GU:  Negative for dysuria, hematuria, urinary incontinence, urinary frequency, nocturnal urination.  MS: Negative for joint pain, low back pain.  Derm: Negative for rash or itching.  Neuro: Negative for weakness, abnormal sensation, seizure, frequent headaches, memory loss, confusion.  Psych: Negative for anxiety, depression, suicidal ideation, hallucinations.  Endo: see hpi Heme: Negative for bruising or bleeding. Allergy: Negative for rash or hives.       Physical Examination: Vital signs in last 24 hours: Temp:  [97.8 F (36.6 C)-98 F (36.7 C)] 97.8 F (36.6 C) (07/14 0601) Pulse Rate:  [60-78] 60 (07/14 0601) Resp:  [17-23] 18 (07/14 0601) BP: (100-134)/(34-60) 130/60 mmHg (07/14 0601) SpO2:  [98 %-100 %] 100 % (07/14 0757) Weight:  [195 lb (88.451 kg)-199 lb 8.3 oz (90.5 kg)] 199 lb 8.3 oz (90.5 kg) (07/14 0601) Last BM Date: 03/01/15  General: chronically ill-appearing. NAD. Daughter at bedside.  Head: Normocephalic, atraumatic.   Eyes: Conjunctiva pink, no icterus. Mouth: Oropharyngeal mucosa moist and pink , no lesions erythema or exudate. Neck: Supple without thyromegaly, masses, or lymphadenopathy.  Lungs: Clear to auscultation bilaterally.  Heart: Regular rate and  rhythm, no murmurs rubs or gallops. Audible prosthetic heart valve. Abdomen: Bowel sounds are normal, minimal tenderness lower abd. nondistended, no hepatosplenomegaly or masses, no abdominal bruits, no rebound or guarding.  +umbilical hernia easily reducible and nontender Rectal: not performed Extremities: No right lower extremity edema, clubbing, deformity. S/p left BKA Neuro: Alert and oriented x 4 ,  grossly normal neurologically.  Skin: Warm and dry, no rash or jaundice.   Psych: Alert and cooperative, normal mood and affect.        Intake/Output from previous day: 07/13 0701 - 07/14 0700 In: 2572.9 [P.O.:600; I.V.:1772.9; IV Piggyback:200] Out: 300 [Urine:300] Intake/Output this shift:    Lab Results: CBC  Recent Labs  03/01/15 1544 03/02/15 0609  WBC 17.5* 12.6*  HGB 8.3* 7.1*  HCT 26.7* 23.1*  MCV 73.2* 73.3*  PLT 352 306   BMET  Recent Labs  03/01/15 1332 03/01/15 1544 03/01/15 2231 03/02/15 0609  NA  --  134*  --  138  K  --  4.2  --  3.6  CL  --  101  --  107  CO2  --  22  --  22  GLUCOSE  --  94 88 54*  BUN  --  63*  --  56*  CREATININE 1.90* 1.93*  --  1.47*  CALCIUM  --  8.6*  --  8.4*   LFT  Recent Labs  03/01/15 1544 03/02/15 0609  BILITOT 0.6 0.5  ALKPHOS 34* 28*  AST 25 18  ALT 13* 12*  PROT 6.8 5.7*  ALBUMIN 3.1* 2.6*    Lipase No results for input(s): LIPASE in the last 72 hours.  PT/INR  Recent Labs  03/01/15 1544 03/02/15 0609  LABPROT 39.8* 42.8*  INR 4.25* 4.69*      Imaging Studies: Ct Abdomen Pelvis Wo Contrast  02/05/2015   CLINICAL DATA:  Patient with epigastric and generalized abdominal pain for 2 weeks.  EXAM: CT ABDOMEN AND PELVIS WITHOUT CONTRAST  TECHNIQUE: Multidetector CT imaging of the abdomen and pelvis was performed following the standard protocol without IV contrast.  COMPARISON:  CT abdomen pelvis 04/22/2012  FINDINGS: Lower chest: Re- demonstrated pleural-based calcifications within the left lower hemi  thorax. Normal heart size. Unchanged probable scarring left lower lobe.  Hepatobiliary: Liver is normal in size and contour without focal hepatic lesion identified. Gallbladder is unremarkable. No intrahepatic or extrahepatic biliary ductal dilatation.  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 Right adrenal gland is unremarkable. Re- demonstrated focal atrophy of the inferior pole of the right kidney. 3 mm nonobstructing stone superior pole right kidney. Stable bilateral perinephric fat stranding. Urinary bladder is unremarkable. No ureterolithiasis. No hydronephrosis.  Stomach/Bowel: Small fat containing periumbilical hernia. There is mild wall thickening of the cecum. No significant surrounding fat stranding. Normal appendix. No evidence for bowel obstruction. Descending and sigmoid colonic diverticulosis without evidence for acute diverticulitis. No free fluid or free intraperitoneal air.  Vascular/Lymphatic: Extensive calcification of the abdominal aorta. No retroperitoneal lymphadenopathy.  Other: Fat containing periumbilical hernia.  Musculoskeletal: Lower lumbar spine degenerative changes. No aggressive or acute appearing osseous lesions.  IMPRESSION: Wall thickening of the cecum without significant surrounding inflammatory stranding. Findings may represent combination of underdistention and or possible focal colitis. Recommend correlation with colonoscopy in the nonacute setting to exclude the possibility of underlying mass.   Electronically Signed   By: Lovey Newcomer M.D.   On: 02/05/2015 22:04   Ct Abdomen Pelvis W Contrast  03/01/2015   CLINICAL DATA:  Generalized abdominal pain, several month duration. Diarrhea.  EXAM: CT ABDOMEN AND PELVIS WITH CONTRAST  TECHNIQUE: Multidetector CT imaging of the abdomen and pelvis was performed using the standard protocol following bolus administration of intravenous contrast.  CONTRAST:  87m  OMNIPAQUE IOHEXOL 300 MG/ML  SOLN  COMPARISON:  02/05/2015  FINDINGS: There is chronic pleural and parenchymal scarring at the left base with calcification. No active chest disease is identified.  There is considerable motion degradation in the upper abdomen. The left lobe of the liver is prominent and there is slight lobulation of the right lobe of the liver raising the possibility of early cirrhosis. There may be some sludge or noncalcified stones dependent in the gallbladder. The spleen is normal. The pancreas is normal. The adrenal glands are normal. No renal parenchymal lesion is seen. There is atrophy at the lower pole the right kidney. There is a punctate stone in the upper pole the right kidney. The kidneys do not appear to be excreting contrast, 3 minutes after injection. There is advanced atherosclerosis of the aorta and its branch vessels. The IVC is normal.  There are several small bowel loops probably in the ileum that show wall thickening. Ischemic changes a concern given the degree of vascular disease. Inflammatory bowel disease or infectious inflammation could have a similar appearance. No perforation. No free fluid. The colon appears normal today.  The bladder is thick walled. The prostate is slightly enlarged. No pelvic lymphadenopathy. There are extensive chronic degenerative changes of the spine. There is a small ventral hernia containing only fat.  IMPRESSION: Abnormal thick walled loops of small intestine, probably within the ileum. 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 The differential diagnosis does include inflammatory bowel disease and infectious inflammation.  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  Poor excretion of contrast 3 minutes after injection suggesting the possibility of severely impaired renal function.  These results will be called to the ordering clinician or representative by the Radiologist Assistant, and communication  documented in the PACS or zVision Dashboard.   Electronically Signed   By: Nelson Chimes M.D.   On: 03/01/2015 14:43   Dg Chest Portable 1 View  02/04/2015   CLINICAL DATA:  Shortness of breath.  EXAM: PORTABLE CHEST - 1 VIEW  COMPARISON:  12/28/2014  FINDINGS: Postoperative changes in the mediastinum. Cardiac enlargement with pulmonary vascular congestion and perihilar infiltrates suggesting edema. Linear atelectasis in the left mid lung. No definite pleural effusion although the entire costophrenic angles are not included within the field of view. No pneumothorax. Old resection or resorption of the distal right clavicle.  IMPRESSION: Cardiac enlargement with pulmonary vascular congestion and perihilar edema.   Electronically Signed   By: Lucienne Capers M.D.   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  Symptomatic at that time so conservative approach taken. Complains of lower abdominal pain, present off/on for several months, significant anorexia, some weight loss of 30 pounds since 2012 including BKA. Discussed at length with Dr. [name]. There is concern that his abdominal pain is related to mesenteric ischemia. He also has IDA requiring transfusion in setting of Plavix and Coumadin, recent EGD normal. Last TCS 2012, ischemic colonic ulcers. Discussed with patient, patient's two daughters.   I have reviewed CT locally with Dr. Nyoka Cowden who suspects enough information available from current CT regarding mesenteric vasculature. I have spoken to Jannifer Franklin,  PA-C with Interventional Radiology  who will discuss case with oncall IR, Dr. Pascal Lux. Patient did have significant hypotension from paralytic agent and sedatives during his cardiac cath in 04/2014 requiring intubation for respiratory failure. Subsequently has had coronary and PV angiograms with intervention without problems.  Plan: 1. Await input from interventional radiology regarding consideration of angiogram for mesenteric ischemia. 2. If patient develops severe abdominal pain, would recommend emergent surgical consultation as he is at risk of acute ischemic bowel.  We would like to thank you for the opportunity to participate in the care of Tanner Pena.  Laureen Ochs. Bernarda Caffey [place] [facility] [phone] 7/14/201610:17 AM    LOS: 1 day    Attending note:  Agree with above assessment and recommendations.  Addendum: I have discussed with Dr. Pascal Lux (IR). 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 He may not be able to span segment with a stent. Moreover, right femoral arteryl may not be patent which would preclude percutaneous access to the area of vasculature in question.  He recommends proceeding with the CTA as this protocol will give [place] better arterial images. Motion artifact on yesterday's contrast study limits interpretation of the mesenteric vasculature patency. We will order.  Recent negative EGD noted. No endoscopic evaluation warranted at this time as optimization of mesenteric vasculature patency takes top priority. History of known ischemic colonic ulcers. Management of coagulopathy. Transfuse with packed RBCs as necessary.  If IR not an option for this patient, he would need to be seen by a vascular surgeon

## 2015-03-02 NOTE — Telephone Encounter (Signed)
Just received patient CBC results this morning from last office visit. Concerning drop in Hgb to 7.4 (Hct 25.7). When I attempted to look up patient in the system it was noted he is currently hospitalized and being followed by GI as an inpatient.

## 2015-03-02 NOTE — Progress Notes (Signed)
Hypoglycemic Event  CBG: 65  Treatment: 15 GM carbohydrate snack  Symptoms: None  Follow-up CBG: Time:2214 CBG Result:63 Another snack was given and CBG was rechecked; result was 93  Possible Reasons for Event: Inadequate meal intake  Comments/MD notified: K. Baltazar Najjar notified    Tanner Pena  Remember to initiate Hypoglycemia Order Set & complete

## 2015-03-02 NOTE — Progress Notes (Signed)
[facility] PROGRESS NOTE  Tanner Pena GDJ:[id number] DOB: 03-16-1941 DOA: 03/01/2015 PCP: [name], MD  Assessment/Plan: Abdominal pain: patient with known arterial occlusive disease per CTA 2012, to result in mesenteric ischemia and ischemic ulcers of the colon. Extensive vascular calcifications. 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 Chart review indicates that in 2012, results  discussed with Dr. Alvester Chou and conservative measures felt to be appropriate as patient was asymptomatic at that time.   - Evaluated by GI and concern for current pain related to mesenteric ischemia and will evaluate with a CT angio abdomen - may need further interventions, stop coumadin, monitor INR, when < 2.5 will initiate heparin gtt - continue Cipro and Flagyl  Acute blood loss anemia:  - hg 7.1 i - Will transfuse 1 unit PRBC's. 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  Acute renal failure. Likely related to above. Creatinine tending down this am. Hold nephrotoxins (ramaril, lasix). 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 See #1.   CAD: no chest pain. Continue plavix and fenofibrate.  DM2 (diabetes mellitus, type 2) CBG range 55-113. Will discontinue amaryl. Continue SSI for optimal control. A1c 7.2.  COPD (chronic obstructive pulmonary disease): stable at baseline.    IDA (iron deficiency anemia): see above. Continue iron supplement  Chronic anticoagulation: see #3.   Code Status: full Family Communication: patient Disposition Plan: to be determined  Consultants:  GI  Procedures:  none  Antibiotics:  cipro 03/01/15>>  Flagyl 03/01/15>>  HPI/Subjective: Reports abdominal pain much improved. Denies nausea. Reports "normal" BM this am  Objective: Filed Vitals:   03/02/15 0601  BP: 130/60  Pulse: 60  Temp: 97.8 F  (36.6 C)  Resp: 18    Intake/Output Summary (Last 24 hours) at 03/02/15 1130 Last data filed at 03/02/15 1001  Gross per 24 hour  Intake 2932.92 ml  Output    300 ml  Net 2632.92 ml   Filed Weights   03/01/15 1507 03/01/15 1809 03/02/15 0601  Weight: 88.451 kg (195 lb) 88.451 kg (195 lb) 90.5 kg (199 lb 8.3 oz)   Exam:  General:  Well nourished appears comfortable  HEENT: no scleral icterus  Cardiovascular: RRR left AKA no right LE edema  Respiratory: normal effort BS clear bilaterally no wheeze  Abdomen: obese soft +BS mild tenderness RLQ no guarding  Skin: no rashes  Musculoskeletal: no clubbing or cyanosis   Neuro: MS 5/5 in all 4  Data Reviewed: Basic Metabolic Panel:  Recent Labs Lab 03/01/15 1332 03/01/15 1544 03/01/15 2231 03/02/15 0609  NA  --  134*  --  138  K  --  4.2  --  3.6  CL  --  101  --  107  CO2  --  22  --  22  GLUCOSE  --  94 88 54*  BUN  --  63*  --  56*  CREATININE 1.90* 1.93*  --  1.47*  CALCIUM  --  8.6*  --  8.4*   Liver Function Tests:  Recent Labs Lab 03/01/15 1544 03/02/15 0609  AST 25 18  ALT 13* 12*  ALKPHOS 34* 28*  BILITOT 0.6 0.5  PROT 6.8 5.7*  ALBUMIN 3.1* 2.6*   CBC:  Recent Labs Lab 03/01/15 1544 03/02/15 0609  WBC 17.5* 12.6*  HGB 8.3* 7.1*  HCT 26.7* 23.1*  MCV 73.2* 73.3*  PLT 352 306   BNP (last 3 results)  Recent Labs  12/28/14 0134 02/04/15 0413  BNP 504.3* 593.2*    ProBNP (last 3 results)  Recent Labs  05/10/14 0229 05/11/14 1530  PROBNP 820.3* 1302.0*    CBG:  Recent Labs Lab 03/01/15 2144 03/01/15 2214 03/01/15 2254 03/02/15 0756 03/02/15 0919  GLUCAP 65 63* 93 55* 113*   Studies: Ct Abdomen Pelvis W Contrast  03/01/2015   CLINICAL DATA:  Generalized abdominal pain, several month duration. Diarrhea.  EXAM: CT ABDOMEN AND PELVIS WITH CONTRAST  TECHNIQUE: Multidetector CT imaging of the abdomen and pelvis was performed using the standard protocol following bolus  administration of intravenous contrast.  CONTRAST:  85m OMNIPAQUE IOHEXOL 300 MG/ML  SOLN  COMPARISON:  02/05/2015  FINDINGS: There is chronic pleural and parenchymal scarring at the left base with calcification. No active chest disease is identified.  There is considerable motion degradation in the upper abdomen. The left lobe of the liver is prominent and there is slight lobulation of the right lobe of the liver raising the possibility of early cirrhosis. There may be some sludge or noncalcified stones dependent in the gallbladder. The spleen is normal. The pancreas is normal. The adrenal glands are normal. No renal parenchymal lesion is seen. There is atrophy at the lower pole the right kidney. There is a punctate stone in the upper pole the right kidney. The kidneys do not appear to be excreting contrast, 3 minutes after injection. There is advanced atherosclerosis of the aorta and its branch vessels. The IVC is normal.  There are several small bowel loops probably in the ileum that show wall thickening. Ischemic changes a concern given the degree of vascular disease. Inflammatory bowel disease or infectious inflammation could have a similar appearance. No perforation. No free fluid. The colon appears normal today.  The bladder is thick walled. The prostate is slightly enlarged. No pelvic lymphadenopathy. There are extensive chronic degenerative changes of the spine. There is a small ventral hernia containing only fat.  IMPRESSION: Abnormal thick walled loops of small intestine, probably within the ileum. 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 The differential diagnosis does include inflammatory bowel disease and infectious inflammation.  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  Poor excretion of contrast 3 minutes after injection suggesting the possibility of severely impaired renal function.  These results will be called to the ordering clinician or  representative by the Radiologist Assistant, and communication documented in the PACS or zVision Dashboard.   Electronically Signed   By: MNelson ChimesM.D.   On: 03/01/2015 14:43    Scheduled Meds: . sodium chloride   Intravenous Once  . albuterol  4 mg Oral BID  . allopurinol  300 mg Oral QHS  . cholecalciferol  2,000 Units Oral Daily  . ciprofloxacin  200 mg Intravenous Q12H  . clopidogrel  75 mg Oral Q breakfast  . fenofibrate  160 mg Oral Daily  . ferrous sulfate  325 mg Oral Q breakfast  . furosemide  40 mg Oral Daily  . insulin aspart  0-5 Units Subcutaneous QHS  . insulin aspart  0-9 Units Subcutaneous TID WC  . ipratropium-albuterol  3 mL Inhalation QID  . isosorbide mononitrate  30 mg Oral Daily  . linagliptin  5 mg Oral Daily  . metoprolol succinate  50 mg Oral Daily  . metronidazole  500 mg Intravenous Q8H  . omega-3 acid ethyl esters  1 g Oral BID  .  potassium chloride SA  20 mEq Oral Daily  . pregabalin  50 mg Oral BID  . ramipril  2.5 mg Oral Daily  . rosuvastatin  20 mg Oral q1800  . sodium chloride  3 mL Intravenous Q12H  . sucralfate  1 g Oral BID  . vitamin C with rose hips  1,000 mg Oral Daily  . Warfarin - Pharmacist Dosing Inpatient   Does not apply Q24H   Continuous Infusions: . sodium chloride 125 mL/hr at 03/02/15 [place] Pager [phone]. If 7PM-7AM, please contact night-coverage at [email or web address], password [facility] 03/02/2015, 11:30 AM  LOS: 1 day   Patient was seen, examined,treatment plan was discussed with the Advance Practice Provider.  I have directly reviewed the clinical findings, lab, imaging studies and management of this patient in detail. I have made the necessary changes to the above noted documentation, and agree with the documentation, as recorded by the Advance Practice Provider.   Time spent: [place] M. Cruzita Lederer, MD [facility] [phone]

## 2015-03-02 NOTE — Progress Notes (Signed)
Inpatient Diabetes Program Recommendations  AACE/ADA: New Consensus Statement on Inpatient Glycemic Control (2013)  Target Ranges:  Prepandial:   less than 140 mg/dL      Peak postprandial:   less than 180 mg/dL (1-2 hours)      Critically ill patients:  140 - 180 mg/dL   Results for DELOYD, HANDY (MRN [id number]) as of 03/02/2015 07:23  Ref. Range 03/01/2015 21:44 03/01/2015 22:14 03/01/2015 22:54  Glucose-Capillary Latest Ref Range: 65-99 mg/dL 65 63 (L) 93   Diabetes history: DM2 Outpatient Diabetes medications: Amaryl 1 mg BID, Januvia 50 mg  QAM Current orders for Inpatient glycemic control: Amaryl 1 mg BID, Tradjenta 5 mg daily, Novolog 0-9 units TID with meals, Novolog 0-5 units HS  Inpatient Diabetes Program Recommendations Oral Agents: Noted glucose of 63 mg/dl on 7/13 at 22:14. May want to consider discontinuing Amaryl while inpatient.  Thanks, Barnie Alderman, RN, MSN, CCRN, CDE Diabetes Coordinator Inpatient Diabetes Program [phone] (Team Pager from [place] to [place]) [phone] ([facility] office) [phone] [facility] office) [phone] [facility] office)

## 2015-03-03 ENCOUNTER — Encounter (HOSPITAL_COMMUNITY): Admission: RE | Admit: 2015-03-03 | Payer: Medicare Other | Source: Ambulatory Visit

## 2015-03-03 DIAGNOSIS — R1084 Generalized abdominal pain: Secondary | ICD-10-CM

## 2015-03-03 DIAGNOSIS — K551 Chronic vascular disorders of intestine: Secondary | ICD-10-CM | POA: Diagnosis present

## 2015-03-03 DIAGNOSIS — D62 Acute posthemorrhagic anemia: Secondary | ICD-10-CM

## 2015-03-03 LAB — CBC
HCT: 24.1 % — ABNORMAL LOW (ref 39.0–52.0)
Hemoglobin: 7.4 g/dL — ABNORMAL LOW (ref 13.0–17.0)
MCH: 23 pg — ABNORMAL LOW (ref 26.0–34.0)
MCHC: 30.7 g/dL (ref 30.0–36.0)
MCV: 74.8 fL — ABNORMAL LOW (ref 78.0–100.0)
Platelets: 278 10*3/uL (ref 150–400)
RBC: 3.22 MIL/uL — ABNORMAL LOW (ref 4.22–5.81)
RDW: 21.7 % — AB (ref 11.5–15.5)
WBC: 9.9 10*3/uL (ref 4.0–10.5)

## 2015-03-03 LAB — GLUCOSE, CAPILLARY
GLUCOSE-CAPILLARY: 126 mg/dL — AB (ref 65–99)
GLUCOSE-CAPILLARY: 130 mg/dL — AB (ref 65–99)

## 2015-03-03 LAB — BASIC METABOLIC PANEL
ANION GAP: 6 (ref 5–15)
BUN: 29 mg/dL — AB (ref 6–20)
CALCIUM: 8.4 mg/dL — AB (ref 8.9–10.3)
CHLORIDE: 111 mmol/L (ref 101–111)
CO2: 24 mmol/L (ref 22–32)
Creatinine, Ser: 1.03 mg/dL (ref 0.61–1.24)
GFR calc Af Amer: >60 mL/min (ref >60)
GFR calc non Af Amer: >60 mL/min (ref >60)
GLUCOSE: 136 mg/dL — AB (ref 65–99)
POTASSIUM: 4 mmol/L (ref 3.5–5.1)
Sodium: 141 mmol/L (ref 135–145)

## 2015-03-03 LAB — PROTIME-INR
INR: 3.3 — ABNORMAL HIGH (ref 0.00–1.49)
PROTHROMBIN TIME: 32.9 s — AB (ref 11.6–15.2)

## 2015-03-03 LAB — PREPARE RBC (CROSSMATCH)

## 2015-03-03 MED ORDER — IPRATROPIUM-ALBUTEROL 0.5-2.5 (3) MG/3ML IN SOLN: 3.0000 mL | RESPIRATORY_TRACT | Status: DC | PRN

## 2015-03-03 MED ORDER — IPRATROPIUM-ALBUTEROL 0.5-2.5 (3) MG/3ML IN SOLN
3.0000 mL | Freq: Two times a day (BID) | RESPIRATORY_TRACT | Status: DC
  Administered 2015-03-04 – 2015-03-09 (×12): 3 mL via RESPIRATORY_TRACT
  Filled 2015-03-03 (×12): qty 3

## 2015-03-03 MED ORDER — SODIUM CHLORIDE 0.9 % IV SOLN
Freq: Once | INTRAVENOUS | Status: AC
  Administered 2015-03-04: 11:00:00 via INTRAVENOUS

## 2015-03-03 NOTE — Progress Notes (Signed)
Patient states he only uses his inhaler at most 3 times a day. Have made him BID as he is not partial to nebulizer treatment. Does have occasional wheezes.

## 2015-03-03 NOTE — Telephone Encounter (Signed)
Noted. Patient is being followed by GI as inpatient at [facility].

## 2015-03-03 NOTE — Care Management Important Message (Signed)
Important Message  Patient Details  Name: LAVALLE SKODA MRN: [id number] Date of Birth: 11/03/1940   Medicare Important Message Given:  Yes-second notification given    Sherald Barge, RN 03/03/2015, 2:36 PM

## 2015-03-03 NOTE — Progress Notes (Signed)
I reviewed yesterday's CTA with Dr. Jacqulynn Cadet (IR). Recent dynamic changes of the distal small bowel seen on serial CTs most indicative of stuttering ischemia. He feels that he can provide percutaneous intervention via stenting. INR needs to be normal. He would like for the patient to continue Plavix without interruption.  Continue to hold Coumadin. He anticipates an outpatient IR clinic appointment next week followed by procedure.  His appointment needs to be scheduled by staff for Tuesday, the 19th of this month with Dr. Laurence Ferrari [phone]).

## 2015-03-03 NOTE — Progress Notes (Signed)
[facility] PROGRESS NOTE  BUCK MCAFFEE WUJ:[id number] DOB: 01-Jul-1941 DOA: 03/01/2015 PCP: [name], MD  HPI 74 y.o. Male admitted on 7/13 with severe abdominal pain in the setting of known advanced atherosclerotic disease and likely representing ischemic colitis. GI was consulted and patient underwent a repeat CTA on 7/14 with extensive aortoiliac atherosclerotic disease. Case was discussed with Dr. Laurence Ferrari from IR who will provide percutaneous intervention via stenting. Given mechanical valve and intervention in few days, patient's coumadin was discontinued and will bridge with heparin just prior to intervention. Patient will be transferred to [facility] on Sunday for IR evaluation Monday morning.   Subjective: Reports continued abdominal pain but "it is no worse" denies nausea  Assessment/Plan: Abdominal pain  - patient with known arterial occlusive disease per CTA 2012, to result in mesenteric ischemia and ischemic ulcers of the colon. CTA 03/02/15 with dynamic changes fo distal small bowel consistent with ischemia.  - GI and IR discuss and recommend percutanious intervention via stenting per GI note. To be scheduled for 03/07/15. INR currently 3.30 and coumadin on hold. Will need to resume heparin when INR 2.5. Continue Cipro and Flagyl - I have discussed with Dr. Laurence Ferrari, will transfer patient to [facility] on Sunday for him to evaluate on Monday.   Acute blood loss anemia:  - hg 7.4 sp 1 unit PRBC's 03/02/15, inappropriate response after the unit yesterday - Will transfuse 1 more unit.   Elevated INR:  - on coumadin for St Jude AVR. This is on hold as INR elevated. INR today 3.30 - heparin gtt once INR reaches ~2  Acute renal failure.  - Resolved. Likely related to above. Hold nephrotoxins (ramaril, lasix).  Chronic mesenteric ischemia:  - 2012 and 2016 CTAs. See #1.   CAD:  - no chest pain. Continue plavix and fenofibrate.  DM2 (diabetes mellitus, type  2) CBG range 126-130. Holding amaryl. Continue SSI for optimal control. A1c 7.2.  COPD (chronic obstructive pulmonary disease): stable at baseline.   IDA (iron deficiency anemia): see above. Continue iron supplement  Chronic anticoagulation: see #3.   Code Status: full Family Communication: d/w family bedside Disposition Plan: home when ready   Consultants:  GI  IR  Procedures:  none  Antibiotics:  cipro 03/01/15>>  Flagyl 03/01/15>>  Objective: Filed Vitals:   03/03/15 0614  BP:   Pulse: 70  Temp:   Resp:     Intake/Output Summary (Last 24 hours) at 03/03/15 1048 Last data filed at 03/03/15 0917  Gross per 24 hour  Intake   4913 ml  Output   1300 ml  Net   3613 ml   Filed Weights   03/01/15 1507 03/01/15 1809 03/02/15 0601  Weight: 88.451 kg (195 lb) 88.451 kg (195 lb) 90.5 kg (199 lb 8.3 oz)   Exam:   General:  Appears comfortable  HEENT: no scleral icterus  Cardiovascular: RRR left AKA. No right LE edema  Respiratory: normal effort BS distant but clear  Abdomen: obese non-distended mild tenderness RLQ  Musculoskeletal: no clubbing or cyanosis   Data Reviewed: Basic Metabolic Panel:  Recent Labs Lab 03/01/15 1332 03/01/15 1544 03/01/15 2231 03/02/15 0609 03/03/15 0609  NA  --  134*  --  138 141  K  --  4.2  --  3.6 4.0  CL  --  101  --  107 111  CO2  --  22  --  22 24  GLUCOSE  --  94 88 54* 136*  BUN  --  63*  --  56* 29*  CREATININE 1.90* 1.93*  --  1.47* 1.03  CALCIUM  --  8.6*  --  8.4* 8.4*   Liver Function Tests:  Recent Labs Lab 03/01/15 1544 03/02/15 0609  AST 25 18  ALT 13* 12*  ALKPHOS 34* 28*  BILITOT 0.6 0.5  PROT 6.8 5.7*  ALBUMIN 3.1* 2.6*   CBC:  Recent Labs Lab 03/01/15 1544 03/02/15 0609 03/03/15 0609  WBC 17.5* 12.6* 9.9  HGB 8.3* 7.1* 7.4*  HCT 26.7* 23.1* 24.1*  MCV 73.2* 73.3* 74.8*  PLT 352 306 278   BNP (last 3 results)  Recent Labs  12/28/14 0134 02/04/15 0413  BNP 504.3*  593.2*    ProBNP (last 3 results)  Recent Labs  05/10/14 0229 05/11/14 1530  PROBNP 820.3* 1302.0*    CBG:  Recent Labs Lab 03/02/15 0919 03/02/15 1137 03/02/15 1646 03/02/15 2126 03/03/15 0742  GLUCAP 113* 120* 123* 126* 130*    Studies: Ct Abdomen Pelvis W Contrast  03/01/2015   CLINICAL DATA:  Generalized abdominal pain, several month duration. Diarrhea.  EXAM: CT ABDOMEN AND PELVIS WITH CONTRAST  TECHNIQUE: Multidetector CT imaging of the abdomen and pelvis was performed using the standard protocol following bolus administration of intravenous contrast.  CONTRAST:  49m OMNIPAQUE IOHEXOL 300 MG/ML  SOLN  COMPARISON:  02/05/2015  FINDINGS: There is chronic pleural and parenchymal scarring at the left base with calcification. No active chest disease is identified.  There is considerable motion degradation in the upper abdomen. The left lobe of the liver is prominent and there is slight lobulation of the right lobe of the liver raising the possibility of early cirrhosis. There may be some sludge or noncalcified stones dependent in the gallbladder. The spleen is normal. The pancreas is normal. The adrenal glands are normal. No renal parenchymal lesion is seen. There is atrophy at the lower pole the right kidney. There is a punctate stone in the upper pole the right kidney. The kidneys do not appear to be excreting contrast, 3 minutes after injection. There is advanced atherosclerosis of the aorta and its branch vessels. The IVC is normal.  There are several small bowel loops probably in the ileum that show wall thickening. Ischemic changes a concern given the degree of vascular disease. Inflammatory bowel disease or infectious inflammation could have a similar appearance. No perforation. No free fluid. The colon appears normal today.  The bladder is thick walled. The prostate is slightly enlarged. No pelvic lymphadenopathy. There are extensive chronic degenerative changes of the spine.  There is a small ventral hernia containing only fat.  IMPRESSION: Abnormal thick walled loops of small intestine, probably within the ileum. 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 The differential diagnosis does include inflammatory bowel disease and infectious inflammation.  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  Poor excretion of contrast 3 minutes after injection suggesting the possibility of severely impaired renal function.  These results will be called to the ordering clinician or representative by the Radiologist Assistant, and communication documented in the PACS or zVision Dashboard.   Electronically Signed   By: MNelson ChimesM.D.   On: 03/01/2015 14:43   Ct Angio Abd/pel W/ And/or W/o  03/02/2015   CLINICAL DATA:  74year old male with abdominal pain  EXAM: CTA ABDOMEN AND PELVIS wITHOUT AND WITH CONTRAST  TECHNIQUE: Multidetector CT imaging of the abdomen and pelvis was performed using the standard protocol during bolus  administration of intravenous contrast. Multiplanar reconstructed images and MIPs were obtained and reviewed to evaluate the vascular anatomy.  CONTRAST:  128m OMNIPAQUE IOHEXOL 350 MG/ML SOLN  COMPARISON:  CT dated 03/01/2015  FINDINGS: Focal left posterior lung base pleural-based calcification likely related to prior surgery or scarring. No intra-abdominal free air or free fluid.  The liver, gallbladder, pancreas, spleen, right adrenal gland appear unremarkable. 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 There is no hydronephrosis on this side. The visualized ureters and the urinary bladder appear grossly unremarkable. Coarse calcification of the prostate gland noted.  There is sigmoid diverticulosis without active inflammatory changes. Loose stool noted throughout the colon compatible with  diarrheal state. Correlation with clinical exam and stool cultures recommended. There is segmental thickening of the distal ileum in the right hemipelvis which appears improved compared to the prior study compatible with residual segmental enteritis. There is no evidence of bowel obstruction. Normal appendix.  Extensive aortoiliac atherosclerotic disease. The origins of the celiac axis, SMA, and IMA as well as the origins of the renal arteries remain patent. No portal venous gas identified. There is no lymphadenopathy. There is mild haziness of the upper mesentery, likely reactive to inflammatory changes of the distal ileum.  Small fat containing umbilical hernia. Degenerative changes of the spine. No acute fracture.  Review of the MIP images confirms the above findings.  IMPRESSION: Segmental thickening of the distal ileum in the right hemipelvis compatible with enteritis. There has been interval improvement of the inflammatory changes compared to the prior study. There is no evidence of bowel obstruction. No free air or drainable fluid collection/ abscess identified.  Diarrheal state. Correlation with clinical exam and stool cultures recommended.   Electronically Signed   By: AAnner CreteM.D.   On: 03/02/2015 19:06    Scheduled Meds: . sodium chloride   Intravenous Once  . albuterol  4 mg Oral BID  . allopurinol  300 mg Oral QHS  . cholecalciferol  2,000 Units Oral Daily  . ciprofloxacin  200 mg Intravenous Q12H  . clopidogrel  75 mg Oral Q breakfast  . fenofibrate  160 mg Oral Daily  . ferrous sulfate  325 mg Oral Q breakfast  . furosemide  40 mg Oral Daily  . insulin aspart  0-5 Units Subcutaneous QHS  . insulin aspart  0-9 Units Subcutaneous TID WC  . ipratropium-albuterol  3 mL Inhalation TID  . isosorbide mononitrate  30 mg Oral Daily  . linagliptin  5 mg Oral Daily  . metoprolol succinate  50 mg Oral Daily  . metronidazole  500 mg Intravenous Q8H  . omega-3 acid ethyl esters  1 g  Oral BID  . potassium chloride SA  20 mEq Oral Daily  . pregabalin  50 mg Oral BID  . rosuvastatin  20 mg Oral q1800  . sodium chloride  3 mL Intravenous Q12H  . sucralfate  1 g Oral BID  . vitamin C with rose hips  1,000 mg Oral Daily   Continuous Infusions: . sodium chloride 125 mL/hr at 03/02/15 2342    Principal Problem:   Abdominal pain Active Problems:   DM2 (diabetes mellitus, type 2)   COPD (chronic obstructive pulmonary disease)   Enteritis   Chronic mesenteric ischemia   IDA (iron deficiency anemia)   Chronic anticoagulation   Acute blood loss anemia  Elevated INR   [facility] Pager [phone]. If 7PM-7AM, please contact night-coverage at [email or web address], password [facility] 03/03/2015, 10:48 AM  LOS: 2 days   Patient was seen, examined,treatment plan was discussed with the Advance Practice Provider.  I have directly reviewed the clinical findings, lab, imaging studies and management of this patient in detail. I have made the necessary changes to the above noted documentation, and agree with the documentation, as recorded by the Advance Practice Provider.    Marzetta Board, MD [facility] [phone]

## 2015-03-03 NOTE — Progress Notes (Addendum)
Subjective: Patient states he's feeling better today. Had a bowel movement this morning which was loose but no noted hematochezia or melena by patient. Abdominal pain is improved and rated 2/10 "at worst." Is continuing pain medication prn. No N/V currently. Tolerating diet well. Patient is anxious to be home and I explained he couldn't be discharged until the hospitalist felt it was safe to do so.   Objective: Vital signs in last 24 hours: Temp:  [98 F (36.7 C)-99.1 F (37.3 C)] 98.6 F (37 C) (07/15 0442) Pulse Rate:  [37-82] 70 (07/15 0614) Resp:  [18-20] 20 (07/15 0442) BP: (97-131)/(32-49) 97/35 mmHg (07/15 0442) SpO2:  [94 %-99 %] 99 % (07/15 0717) Last BM Date: 03/02/15 General:   Alert and oriented, pleasant Head:  Normocephalic and atraumatic. Eyes:  No icterus, sclera clear. Conjuctiva pink.  Heart:  S1, S2 present, no murmurs noted.  Lungs: Clear to auscultation bilaterally, without wheezing, rales, or rhonchi.  Abdomen:  Bowel sounds present, soft, non-tender, non-distended. No HSM or hernias noted. No rebound or guarding. No masses appreciated  Msk:  Left BKA noted. Neurologic:  Alert and  oriented x4;  grossly normal neurologically. Skin:  Warm and dry, intact without significant lesions.  Psych:  Alert and cooperative. Normal mood and affect.  Intake/Output from previous day: 07/14 0701 - 07/15 0700 In: 5270 [P.O.:840; I.V.:3000; Blood:930; IV Piggyback:500] Out: 1300 [Urine:1300] Intake/Output this shift: Total I/O In: 243 [P.O.:240; I.V.:3] Out: -   Lab Results:  Recent Labs  03/01/15 1544 03/02/15 0609 03/03/15 0609  WBC 17.5* 12.6* 9.9  HGB 8.3* 7.1* 7.4*  HCT 26.7* 23.1* 24.1*  PLT 352 306 278   BMET  Recent Labs  03/01/15 1544 03/01/15 2231 03/02/15 0609 03/03/15 0609  NA 134*  --  138 141  K 4.2  --  3.6 4.0  CL 101  --  107 111  CO2 22  --  22 24  GLUCOSE 94 88 54* 136*  BUN 63*  --  56* 29*  CREATININE 1.93*  --  1.47* 1.03   CALCIUM 8.6*  --  8.4* 8.4*   LFT  Recent Labs  03/01/15 1544 03/02/15 0609  PROT 6.8 5.7*  ALBUMIN 3.1* 2.6*  AST 25 18  ALT 13* 12*  ALKPHOS 34* 28*  BILITOT 0.6 0.5   PT/INR  Recent Labs  03/02/15 0609 03/03/15 0609  LABPROT 42.8* 32.9*  INR 4.69* 3.30*   Hepatitis Panel No results for input(s): HEPBSAG, HCVAB, HEPAIGM, HEPBIGM in the last 72 hours.   Studies/Results: Ct Abdomen Pelvis W Contrast  03/01/2015   CLINICAL DATA:  Generalized abdominal pain, several month duration. Diarrhea.  EXAM: CT ABDOMEN AND PELVIS WITH CONTRAST  TECHNIQUE: Multidetector CT imaging of the abdomen and pelvis was performed using the standard protocol following bolus administration of intravenous contrast.  CONTRAST:  106m OMNIPAQUE IOHEXOL 300 MG/ML  SOLN  COMPARISON:  02/05/2015  FINDINGS: There is chronic pleural and parenchymal scarring at the left base with calcification. No active chest disease is identified.  There is considerable motion degradation in the upper abdomen. The left lobe of the liver is prominent and there is slight lobulation of the right lobe of the liver raising the possibility of early cirrhosis. There may be some sludge or noncalcified stones dependent in the gallbladder. The spleen is normal. The pancreas is normal. The adrenal glands are normal. No renal parenchymal lesion is seen. There is atrophy at the lower pole the right kidney.  There is a punctate stone in the upper pole the right kidney. The kidneys do not appear to be excreting contrast, 3 minutes after injection. There is advanced atherosclerosis of the aorta and its branch vessels. The IVC is normal.  There are several small bowel loops probably in the ileum that show wall thickening. Ischemic changes a concern given the degree of vascular disease. Inflammatory bowel disease or infectious inflammation could have a similar appearance. No perforation. No free fluid. The colon appears normal today.  The bladder is  thick walled. The prostate is slightly enlarged. No pelvic lymphadenopathy. There are extensive chronic degenerative changes of the spine. There is a small ventral hernia containing only fat.  IMPRESSION: Abnormal thick walled loops of small intestine, probably within the ileum. 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 The differential diagnosis does include inflammatory bowel disease and infectious inflammation.  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  Poor excretion of contrast 3 minutes after injection suggesting the possibility of severely impaired renal function.  These results will be called to the ordering clinician or representative by the Radiologist Assistant, and communication documented in the PACS or zVision Dashboard.   Electronically Signed   By: Nelson Chimes M.D.   On: 03/01/2015 14:43   Ct Angio Abd/pel W/ And/or W/o  03/02/2015   CLINICAL DATA:  73 year old male with abdominal pain  EXAM: CTA ABDOMEN AND PELVIS wITHOUT AND WITH CONTRAST  TECHNIQUE: Multidetector CT imaging of the abdomen and pelvis was performed using the standard protocol during bolus administration of intravenous contrast. Multiplanar reconstructed images and MIPs were obtained and reviewed to evaluate the vascular anatomy.  CONTRAST:  160m OMNIPAQUE IOHEXOL 350 MG/ML SOLN  COMPARISON:  CT dated 03/01/2015  FINDINGS: Focal left posterior lung base pleural-based calcification likely related to prior surgery or scarring. No intra-abdominal free air or free fluid.  The liver, gallbladder, pancreas, spleen, right adrenal gland appear unremarkable. 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 There is no hydronephrosis on this side. The visualized ureters and the urinary bladder appear grossly unremarkable. Coarse calcification of the  prostate gland noted.  There is sigmoid diverticulosis without active inflammatory changes. Loose stool noted throughout the colon compatible with diarrheal state. Correlation with clinical exam and stool cultures recommended. There is segmental thickening of the distal ileum in the right hemipelvis which appears improved compared to the prior study compatible with residual segmental enteritis. There is no evidence of bowel obstruction. Normal appendix.  Extensive aortoiliac atherosclerotic disease. The origins of the celiac axis, SMA, and IMA as well as the origins of the renal arteries remain patent. No portal venous gas identified. There is no lymphadenopathy. There is mild haziness of the upper mesentery, likely reactive to inflammatory changes of the distal ileum.  Small fat containing umbilical hernia. Degenerative changes of the spine. No acute fracture.  Review of the MIP images confirms the above findings.  IMPRESSION: Segmental thickening of the distal ileum in the right hemipelvis compatible with enteritis. There has been interval improvement of the inflammatory changes compared to the prior study. There is no evidence of bowel obstruction. No free air or drainable fluid collection/ abscess identified.  Diarrheal state. Correlation with clinical exam and stool cultures recommended.   Electronically Signed   By: AAnner CreteM.D.   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 Symptomatic at that time so conservative approach taken. Complains of lower abdominal pain, present off/on for  several months, significant anorexia, some weight loss of 30 pounds since 2012 including BKA. Discussed at length with Dr. [name]. There is concern that his abdominal pain is related to mesenteric ischemia. He also has IDA requiring transfusion in setting of Plavix and Coumadin, recent EGD normal. Last TCS 2012, ischemic colonic ulcers.   Overall clinically improved from a GI standpoint. Some residual abdominal discomfort but improved. no N/V. Tolerating diet well. Received a unit of blood yesterday and Hgb went from 7.1 to 7.4. No overt GI bleed noted this morning.   CT angiogram completed yesterday and Dr. [name] reviewed the study with Dr. Jacqulynn Cadet in IR. Study is most indicative of stuttering ischemia which Dr. Laurence Ferrari feels is amendable to PCI and stenting. INR needs to be normal and will continue to hold coumadin but continue Plavix without interruption.   Discussion with hospitalist about the procedure and he feels the patient will likely need to be inpatient until the procedure due to prosthetic valve off coumadin and eventual need for heparin drip. He discussed possible transfer to [facility] on Sunday evening for IR to evaluate on Monday morning. Made him aware of no GI coverage this weekend.   Plan: 1. Continue pain management and other supportive measures 2. Plan for IR next week, pending inpatient transfer to [facility] vs. Outpatient per hospitalist 3. Monitor for overt GI bleed 4. Monitor CBC and PT/INR; CBC already scheduled for tomorrow morning and PT/INR scheduled daily    Walden Field, AGNP-C Adult & Gerontological Nurse Practitioner [facility]     LOS: 2 days    03/03/2015, 9:50 AM  Patient seen and examined this evening. Agree with above assessment and recommendations as outlined.

## 2015-03-04 LAB — COMPREHENSIVE METABOLIC PANEL
ALT: 9 IU/L (ref 0–44)
AST: 17 IU/L (ref 0–40)
Albumin/Globulin Ratio: 1.4 (ref 1.1–2.5)
Albumin: 3.5 g/dL (ref 3.5–4.8)
Alkaline Phosphatase: 33 IU/L — ABNORMAL LOW (ref 39–117)
BUN/Creatinine Ratio: 28 — ABNORMAL HIGH (ref 10–22)
BUN: 44 mg/dL — AB (ref 8–27)
Bilirubin Total: 0.3 mg/dL (ref 0.0–1.2)
CALCIUM: 9.7 mg/dL (ref 8.6–10.2)
CO2: 17 mmol/L — AB (ref 18–29)
Chloride: 101 mmol/L (ref 97–108)
Creatinine, Ser: 1.57 mg/dL — ABNORMAL HIGH (ref 0.76–1.27)
GFR calc Af Amer: 49 mL/min/{1.73_m2} — ABNORMAL LOW (ref >59)
GFR, EST NON AFRICAN AMERICAN: 43 mL/min/{1.73_m2} — AB (ref >59)
Globulin, Total: 2.5 g/dL (ref 1.5–4.5)
Glucose: 215 mg/dL — ABNORMAL HIGH (ref 65–99)
POTASSIUM: 4.6 mmol/L (ref 3.5–5.2)
SODIUM: 142 mmol/L (ref 134–144)
Total Protein: 6 g/dL (ref 6.0–8.5)

## 2015-03-04 LAB — CBC
HEMATOCRIT: 24.9 % — AB (ref 39.0–52.0)
HEMOGLOBIN: 7.7 g/dL — AB (ref 13.0–17.0)
MCH: 23.3 pg — AB (ref 26.0–34.0)
MCHC: 30.9 g/dL (ref 30.0–36.0)
MCV: 75.5 fL — ABNORMAL LOW (ref 78.0–100.0)
Platelets: 293 10*3/uL (ref 150–400)
RBC: 3.3 MIL/uL — ABNORMAL LOW (ref 4.22–5.81)
RDW: 21.2 % — ABNORMAL HIGH (ref 11.5–15.5)
WBC: 9.9 10*3/uL (ref 4.0–10.5)

## 2015-03-04 LAB — BASIC METABOLIC PANEL
Anion gap: 3 — ABNORMAL LOW (ref 5–15)
BUN: 17 mg/dL (ref 6–20)
CO2: 24 mmol/L (ref 22–32)
Calcium: 8 mg/dL — ABNORMAL LOW (ref 8.9–10.3)
Chloride: 114 mmol/L — ABNORMAL HIGH (ref 101–111)
Creatinine, Ser: 0.86 mg/dL (ref 0.61–1.24)
GFR calc Af Amer: >60 mL/min (ref >60)
GFR calc non Af Amer: >60 mL/min (ref >60)
GLUCOSE: 173 mg/dL — AB (ref 65–99)
POTASSIUM: 3.8 mmol/L (ref 3.5–5.1)
SODIUM: 141 mmol/L (ref 135–145)

## 2015-03-04 LAB — PREPARE RBC (CROSSMATCH)

## 2015-03-04 LAB — PROTIME-INR
INR: 2.51 — AB (ref 0.00–1.49)
PROTHROMBIN TIME: 26.8 s — AB (ref 11.6–15.2)

## 2015-03-04 LAB — HEPARIN LEVEL (UNFRACTIONATED): Heparin Unfractionated: 0.11 IU/mL — ABNORMAL LOW (ref 0.30–0.70)

## 2015-03-04 MED ORDER — SODIUM CHLORIDE 0.9 % IV SOLN: Freq: Once | INTRAVENOUS | Status: AC

## 2015-03-04 MED ORDER — HEPARIN (PORCINE) IN NACL 100-0.45 UNIT/ML-% IJ SOLN
1900.0000 [IU]/h | INTRAMUSCULAR | Status: DC
  Administered 2015-03-04: 1200 [IU]/h via INTRAVENOUS
  Administered 2015-03-04: 1500 [IU]/h via INTRAVENOUS
  Administered 2015-03-05 – 2015-03-06 (×3): 1800 [IU]/h via INTRAVENOUS
  Administered 2015-03-07 – 2015-03-09 (×6): 2000 [IU]/h via INTRAVENOUS
  Administered 2015-03-10: 1900 [IU]/h via INTRAVENOUS
  Administered 2015-03-10: 2000 [IU]/h via INTRAVENOUS
  Administered 2015-03-11: 1900 [IU]/h via INTRAVENOUS
  Filled 2015-03-04 (×17): qty 250

## 2015-03-04 NOTE — Progress Notes (Signed)
[facility] PROGRESS NOTE  MACALLAN ORD PHX:[id number] DOB: [birth year] DOA: 03/01/2015 PCP: [name], MD  HPI 74 y.o. Male admitted on 7/13 with severe abdominal pain in the setting of known advanced atherosclerotic disease and likely representing ischemic colitis. GI was consulted and patient underwent a repeat CTA on 7/14 with extensive aortoiliac atherosclerotic disease. Case was discussed with Dr. Laurence Ferrari from IR who will provide percutaneous intervention via stenting. Given mechanical valve and intervention in few days, patient's coumadin was discontinued and will bridge with heparin just prior to intervention. Patient will be transferred to [facility] on Sunday for IR evaluation Monday morning.   Subjective: - stomach feels "quizzy" this morning  Assessment/Plan: Abdominal pain  - patient with known arterial occlusive disease per CTA 2012, to result in mesenteric ischemia and ischemic ulcers of the colon. CTA 03/02/15 with dynamic changes fo distal small bowel consistent with ischemia.  - GI and IR discuss and recommend percutaneous intervention via stenting per GI note. To be scheduled for 03/07/15. Hold coumadin - I have discussed with Dr. Laurence Ferrari 7/15, will transfer patient to [facility] on Sunday for him to evaluate on Monday.  Acute blood loss anemia:  - Hb 7.7 after 2 units, transfuse 2 additional units today for a total of 4 Elevated INR:  - on coumadin for [employer] AVR. This is on hold for IR. - heparin gtt started today since INR 2.5 Acute renal failure.  - Resolved. Likely related to above. Hold nephrotoxins (ramaril, lasix). Chronic mesenteric ischemia:  - 2012 and 2016 CTAs. See #1.  CAD:  - no chest pain. Continue plavix and fenofibrate. DM2 (diabetes mellitus, type 2) CBG range 126-130. Holding amaryl. Continue SSI for optimal control. A1c 7.2. COPD (chronic obstructive pulmonary disease): stable at baseline.  IDA (iron deficiency anemia): see  above. Continue iron supplement  Code Status: full Family Communication: d/w family bedside Disposition Plan: home when ready  Consultants:  GI  IR  Procedures:  none  Antibiotics:  Cipro 03/01/15>>  Flagyl 03/01/15>>  Objective: Filed Vitals:   03/04/15 0600  BP: 137/48  Pulse: 76  Temp: 98.7 F (37.1 C)  Resp: 18    Intake/Output Summary (Last 24 hours) at 03/04/15 0821 Last data filed at 03/04/15 0604  Gross per 24 hour  Intake 3422.17 ml  Output   1750 ml  Net 1672.17 ml   Filed Weights   03/02/15 0601 03/03/15 1106 03/04/15 0600  Weight: 90.5 kg (199 lb 8.3 oz) 93.5 kg (206 lb 2.1 oz) 94.3 kg (207 lb 14.3 oz)   Exam:   General:  Appears comfortable  HEENT: no scleral icterus  Cardiovascular: RRR left AKA. No right LE edema  Respiratory: normal effort BS distant but clear  Abdomen: obese non-distended mild tenderness RLQ  Musculoskeletal: no clubbing or cyanosis   Data Reviewed: Basic Metabolic Panel:  Recent Labs Lab 03/01/15 1332 03/01/15 1544 03/01/15 2231 03/02/15 0609 03/03/15 0609 03/04/15 0619  NA  --  134*  --  138 141 141  K  --  4.2  --  3.6 4.0 3.8  CL  --  101  --  107 111 114*  CO2  --  22  --  '22 24 24  '$ GLUCOSE  --  94 88 54* 136* 173*  BUN  --  63*  --  56* 29* 17  CREATININE 1.90* 1.93*  --  1.47* 1.03 0.86  CALCIUM  --  8.6*  --  8.4* 8.4* 8.0*  Liver Function Tests:  Recent Labs Lab 03/01/15 1544 03/02/15 0609  AST 25 18  ALT 13* 12*  ALKPHOS 34* 28*  BILITOT 0.6 0.5  PROT 6.8 5.7*  ALBUMIN 3.1* 2.6*   CBC:  Recent Labs Lab 02/28/15 1204 03/01/15 1544 03/02/15 0609 03/03/15 0609 03/04/15 0619  WBC 23.9* 17.5* 12.6* 9.9 9.9  NEUTROABS 22.1*  --   --   --   --   HGB  --  8.3* 7.1* 7.4* 7.7*  HCT 25.7* 26.7* 23.1* 24.1* 24.9*  MCV  --  73.2* 73.3* 74.8* 75.5*  PLT  --  352 306 278 293   BNP (last 3 results)  Recent Labs  12/28/14 0134 02/04/15 0413  BNP 504.3* 593.2*    ProBNP (last 3  results)  Recent Labs  05/10/14 0229 05/11/14 1530  PROBNP 820.3* 1302.0*    CBG:  Recent Labs Lab 03/02/15 0919 03/02/15 1137 03/02/15 1646 03/02/15 2126 03/03/15 0742  GLUCAP 113* 120* 123* 126* 130*    Studies: Ct Angio Abd/pel W/ And/or W/o  03/02/2015   CLINICAL DATA:  74 year old male with abdominal pain  EXAM: CTA ABDOMEN AND PELVIS wITHOUT AND WITH CONTRAST  TECHNIQUE: Multidetector CT imaging of the abdomen and pelvis was performed using the standard protocol during bolus administration of intravenous contrast. Multiplanar reconstructed images and MIPs were obtained and reviewed to evaluate the vascular anatomy.  CONTRAST:  135m OMNIPAQUE IOHEXOL 350 MG/ML SOLN  COMPARISON:  CT dated 03/01/2015  FINDINGS: Focal left posterior lung base pleural-based calcification likely related to prior surgery or scarring. No intra-abdominal free air or free fluid.  The liver, gallbladder, pancreas, spleen, right adrenal gland appear unremarkable. 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 There is no hydronephrosis on this side. The visualized ureters and the urinary bladder appear grossly unremarkable. Coarse calcification of the prostate gland noted.  There is sigmoid diverticulosis without active inflammatory changes. Loose stool noted throughout the colon compatible with diarrheal state. Correlation with clinical exam and stool cultures recommended. There is segmental thickening of the distal ileum in the right hemipelvis which appears improved compared to the prior study compatible with residual segmental enteritis. There is no evidence of bowel obstruction. Normal appendix.  Extensive aortoiliac atherosclerotic disease. The origins of the celiac axis, SMA, and IMA as well as the origins of the renal arteries remain patent. No portal venous gas  identified. There is no lymphadenopathy. There is mild haziness of the upper mesentery, likely reactive to inflammatory changes of the distal ileum.  Small fat containing umbilical hernia. Degenerative changes of the spine. No acute fracture.  Review of the MIP images confirms the above findings.  IMPRESSION: Segmental thickening of the distal ileum in the right hemipelvis compatible with enteritis. There has been interval improvement of the inflammatory changes compared to the prior study. There is no evidence of bowel obstruction. No free air or drainable fluid collection/ abscess identified.  Diarrheal state. Correlation with clinical exam and stool cultures recommended.   Electronically Signed   By: AAnner CreteM.D.   On: 03/02/2015 19:06    Scheduled Meds: . sodium chloride   Intravenous Once  . sodium chloride   Intravenous Once  . albuterol  4 mg Oral BID  . allopurinol  300 mg Oral QHS  . cholecalciferol  2,000 Units Oral Daily  . ciprofloxacin  200  mg Intravenous Q12H  . clopidogrel  75 mg Oral Q breakfast  . fenofibrate  160 mg Oral Daily  . ferrous sulfate  325 mg Oral Q breakfast  . furosemide  40 mg Oral Daily  . insulin aspart  0-5 Units Subcutaneous QHS  . insulin aspart  0-9 Units Subcutaneous TID WC  . ipratropium-albuterol  3 mL Inhalation BID  . isosorbide mononitrate  30 mg Oral Daily  . linagliptin  5 mg Oral Daily  . metoprolol succinate  50 mg Oral Daily  . metronidazole  500 mg Intravenous Q8H  . omega-3 acid ethyl esters  1 g Oral BID  . potassium chloride SA  20 mEq Oral Daily  . pregabalin  50 mg Oral BID  . rosuvastatin  20 mg Oral q1800  . sodium chloride  3 mL Intravenous Q12H  . sucralfate  1 g Oral BID  . vitamin C with rose hips  1,000 mg Oral Daily   Continuous Infusions: . heparin 1,200 Units/hr (03/04/15 0802)    Principal Problem:   Abdominal pain Active Problems:   DM2 (diabetes mellitus, type 2)   COPD (chronic obstructive pulmonary  disease)   Enteritis   Chronic mesenteric ischemia   IDA (iron deficiency anemia)   Chronic anticoagulation   Acute blood loss anemia   Elevated INR   Mesenteric ischemia, chronic   Marzetta Board  [facility] Pager [phone]. If 7PM-7AM, please contact night-coverage at [email or web address], password [facility] 03/04/2015, 8:21 AM  LOS: 3 days

## 2015-03-04 NOTE — Progress Notes (Signed)
[facility] for Warfarin >> Heparin bridge for procedure Indication: ST JUDE AVR  No Known Allergies  Patient Measurements: Height: '5\' 10"'$  (177.8 cm) Weight: 207 lb 14.3 oz (94.3 kg) IBW/kg (Calculated) : 73  Vital Signs: Temp: 98.7 F (37.1 C) (07/16 0600) Temp Source: Oral (07/16 0600) BP: 137/48 mmHg (07/16 0600) Pulse Rate: 76 (07/16 0600)  Labs:  Recent Labs  03/02/15 0609 03/03/15 0609 03/04/15 0619  HGB 7.1* 7.4* 7.7*  HCT 23.1* 24.1* 24.9*  PLT 306 278 293  LABPROT 42.8* 32.9* 26.8*  INR 4.69* 3.30* 2.51*  CREATININE 1.47* 1.03 0.86   Estimated Creatinine Clearance: 86.9 mL/min (by C-G formula based on Cr of 0.86).  Medications:  Prescriptions prior to admission  Medication Sig Dispense Refill Last Dose  . ACCU-CHEK AVIVA PLUS test strip    03/01/2015 at Unknown time  . albuterol (PROVENTIL) 4 MG tablet Take 4 mg by mouth 3 (three) times daily.   03/01/2015 at Unknown time  . albuterol-ipratropium (COMBIVENT) 18-103 MCG/ACT inhaler Inhale 1 puff into the lungs 4 (four) times daily. Coughing/ Shortness of Breath   03/01/2015 at Unknown time  . allopurinol (ZYLOPRIM) 300 MG tablet Take 300 mg by mouth at bedtime.    03/01/2015 at Unknown time  . Ascorbic Acid (VITAMIN C WITH ROSE HIPS) 1000 MG tablet Take 1,000 mg by mouth daily.   03/01/2015 at Unknown time  . cholecalciferol (VITAMIN D) 1000 UNITS tablet Take 2,000 Units by mouth daily.   03/01/2015 at Unknown time  . clopidogrel (PLAVIX) 75 MG tablet Take 1 tablet (75 mg total) by mouth daily with breakfast. 30 tablet 11 03/01/2015 at Unknown time  . CRESTOR 20 MG tablet TAKE (1) TABLET BY MOUTH AT BEDTIME FOR CHOLESTEROL. 30 tablet 3 03/01/2015 at Unknown time  . Emollient (EUCERIN) lotion Apply 10 mLs topically as needed for dry skin.   03/01/2015 at Unknown time  . fenofibrate (TRICOR) 145 MG tablet TAKE 1 TABLET BY MOUTH ONCE DAILY FOR CHOLESTEROL. (Patient taking differently: TAKE  1 TABLET BY MOUTH every evening FOR CHOLESTEROL.) 30 tablet 3 03/01/2015 at Unknown time  . ferrous sulfate 325 (65 FE) MG tablet Take 1 tablet (325 mg total) by mouth daily with breakfast. 30 tablet 1 03/01/2015 at Unknown time  . fish oil-omega-3 fatty acids 1000 MG capsule Take 1 capsule (1 g total) by mouth 2 (two) times daily.   03/01/2015 at Unknown time  . furosemide (LASIX) 80 MG tablet Take 1 tablet (80 mg total) by mouth daily.   03/01/2015 at Unknown time  . glimepiride (AMARYL) 1 MG tablet Take 1 mg by mouth 2 (two) times daily.   03/01/2015 at Unknown time  . isosorbide mononitrate (IMDUR) 30 MG 24 hr tablet TAKE ONE TABLET BY MOUTH ONCE DAILY. 30 tablet 5 03/01/2015 at Unknown time  . metoprolol succinate (TOPROL-XL) 50 MG 24 hr tablet Take 1 tablet (50 mg total) by mouth daily. Take with or immediately following a meal. 30 tablet 11 03/01/2015 at Unknown time  . nitroGLYCERIN (NITROSTAT) 0.4 MG SL tablet Place 1 tablet (0.4 mg total) under the tongue every 5 (five) minutes as needed for chest pain. 25 tablet 12 Unknown  . oxyCODONE-acetaminophen (PERCOCET) 10-325 MG per tablet Take 1 tablet by mouth every 4 (four) hours as needed for pain. (Patient taking differently: Take 1 tablet by mouth 2 (two) times daily as needed for pain. ) 30 tablet 0 03/01/2015 at Unknown time  . pantoprazole (  PROTONIX) 40 MG tablet Take 40 mg by mouth daily.   03/01/2015 at Unknown time  . potassium chloride SA (K-DUR,KLOR-CON) 20 MEQ tablet Take 20 mEq by mouth daily.     03/01/2015 at Unknown time  . pregabalin (LYRICA) 50 MG capsule Take one capsule by mouth twice daily for pains 60 capsule 5 03/01/2015 at Unknown time  . sitaGLIPtin (JANUVIA) 50 MG tablet Take 50 mg by mouth daily.   03/01/2015 at Unknown time  . sucralfate (CARAFATE) 1 G tablet Take 1 g by mouth 2 (two) times daily.    03/01/2015 at Unknown time  . warfarin (COUMADIN) 5 MG tablet Take 1-1.5 tablets (5-7.5 mg total) by mouth at bedtime. Takes one-half  tablet (7.'5mg'$  total) on Fridays. Takes one tablet ('5mg'$  total) on all other days (Patient taking differently: Take 2.5-5 mg by mouth at bedtime. Take 2.5 mg on Monday and Friday.  Take 5 mg all other days.) 30 tablet 11 03/01/2015 at Unknown time  . zinc gluconate 50 MG tablet Take 50 mg by mouth daily.   03/01/2015 at Unknown time  . ramipril (ALTACE) 2.5 MG capsule Take 2.5 mg by mouth at bedtime.      . ramipril (ALTACE) 5 MG capsule Take 5 mg by mouth every morning.       Assessment: 74 yo M on chronic Coumadin for AVR.  Home dose listed above.  INR was elevated on admission.  Hx multiple dose changes and fluctuating INR as outpatient.  He is also on Cipro & Flagyl this admission which can interact to elevate INR.  Recent rectal bleeding reported by patient- he is being followed by GI this admission.  No active bleeding noted at this time.  Heparin to be started to bridge Warfarin Rx pending procedure / GI workup.  Goal of Therapy:  INR 2.5-3.5   Plan:  Begin Heparin at 1200 units/hr  (No Bolus) Heparin level in 6 hrs  Heparin level and CBC daily Daily PT/INR  [place] Crane, Roy Tokarz A 03/04/2015,7:38 AM

## 2015-03-04 NOTE — Progress Notes (Signed)
[place] for Warfarin >> Heparin bridge for procedure Indication: ST JUDE AVR  No Known Allergies  Patient Measurements: Height: '5\' 10"'$  (177.8 cm) Weight: 207 lb 14.3 oz (94.3 kg) IBW/kg (Calculated) : 73  Vital Signs: Temp: 98.5 F (36.9 C) (07/16 1642) Temp Source: Oral (07/16 1642) BP: 135/46 mmHg (07/16 1642) Pulse Rate: 61 (07/16 1642)  Labs:  Recent Labs  03/02/15 0609 03/03/15 0609 03/04/15 0619 03/04/15 1401  HGB 7.1* 7.4* 7.7*  --   HCT 23.1* 24.1* 24.9*  --   PLT 306 278 293  --   LABPROT 42.8* 32.9* 26.8*  --   INR 4.69* 3.30* 2.51*  --   HEPARINUNFRC  --   --   --  0.11*  CREATININE 1.47* 1.03 0.86  --    Estimated Creatinine Clearance: 86.9 mL/min (by C-G formula based on Cr of 0.86).  Medications:  Prescriptions prior to admission  Medication Sig Dispense Refill Last Dose  . ACCU-CHEK AVIVA PLUS test strip    03/01/2015 at Unknown time  . albuterol (PROVENTIL) 4 MG tablet Take 4 mg by mouth 3 (three) times daily.   03/01/2015 at Unknown time  . albuterol-ipratropium (COMBIVENT) 18-103 MCG/ACT inhaler Inhale 1 puff into the lungs 4 (four) times daily. Coughing/ Shortness of Breath   03/01/2015 at Unknown time  . allopurinol (ZYLOPRIM) 300 MG tablet Take 300 mg by mouth at bedtime.    03/01/2015 at Unknown time  . Ascorbic Acid (VITAMIN C WITH ROSE HIPS) 1000 MG tablet Take 1,000 mg by mouth daily.   03/01/2015 at Unknown time  . cholecalciferol (VITAMIN D) 1000 UNITS tablet Take 2,000 Units by mouth daily.   03/01/2015 at Unknown time  . clopidogrel (PLAVIX) 75 MG tablet Take 1 tablet (75 mg total) by mouth daily with breakfast. 30 tablet 11 03/01/2015 at Unknown time  . CRESTOR 20 MG tablet TAKE (1) TABLET BY MOUTH AT BEDTIME FOR CHOLESTEROL. 30 tablet 3 03/01/2015 at Unknown time  . Emollient (EUCERIN) lotion Apply 10 mLs topically as needed for dry skin.   03/01/2015 at Unknown time  . fenofibrate (TRICOR) 145 MG tablet  TAKE 1 TABLET BY MOUTH ONCE DAILY FOR CHOLESTEROL. (Patient taking differently: TAKE 1 TABLET BY MOUTH every evening FOR CHOLESTEROL.) 30 tablet 3 03/01/2015 at Unknown time  . ferrous sulfate 325 (65 FE) MG tablet Take 1 tablet (325 mg total) by mouth daily with breakfast. 30 tablet 1 03/01/2015 at Unknown time  . fish oil-omega-3 fatty acids 1000 MG capsule Take 1 capsule (1 g total) by mouth 2 (two) times daily.   03/01/2015 at Unknown time  . furosemide (LASIX) 80 MG tablet Take 1 tablet (80 mg total) by mouth daily.   03/01/2015 at Unknown time  . glimepiride (AMARYL) 1 MG tablet Take 1 mg by mouth 2 (two) times daily.   03/01/2015 at Unknown time  . isosorbide mononitrate (IMDUR) 30 MG 24 hr tablet TAKE ONE TABLET BY MOUTH ONCE DAILY. 30 tablet 5 03/01/2015 at Unknown time  . metoprolol succinate (TOPROL-XL) 50 MG 24 hr tablet Take 1 tablet (50 mg total) by mouth daily. Take with or immediately following a meal. 30 tablet 11 03/01/2015 at Unknown time  . nitroGLYCERIN (NITROSTAT) 0.4 MG SL tablet Place 1 tablet (0.4 mg total) under the tongue every 5 (five) minutes as needed for chest pain. 25 tablet 12 Unknown  . oxyCODONE-acetaminophen (PERCOCET) 10-325 MG per tablet Take 1 tablet by mouth every 4 (four)  hours as needed for pain. (Patient taking differently: Take 1 tablet by mouth 2 (two) times daily as needed for pain. ) 30 tablet 0 03/01/2015 at Unknown time  . pantoprazole (PROTONIX) 40 MG tablet Take 40 mg by mouth daily.   03/01/2015 at Unknown time  . potassium chloride SA (K-DUR,KLOR-CON) 20 MEQ tablet Take 20 mEq by mouth daily.     03/01/2015 at Unknown time  . pregabalin (LYRICA) 50 MG capsule Take one capsule by mouth twice daily for pains 60 capsule 5 03/01/2015 at Unknown time  . sitaGLIPtin (JANUVIA) 50 MG tablet Take 50 mg by mouth daily.   03/01/2015 at Unknown time  . sucralfate (CARAFATE) 1 G tablet Take 1 g by mouth 2 (two) times daily.    03/01/2015 at Unknown time  . warfarin (COUMADIN)  5 MG tablet Take 1-1.5 tablets (5-7.5 mg total) by mouth at bedtime. Takes one-half tablet (7.'5mg'$  total) on Fridays. Takes one tablet ('5mg'$  total) on all other days (Patient taking differently: Take 2.5-5 mg by mouth at bedtime. Take 2.5 mg on Monday and Friday.  Take 5 mg all other days.) 30 tablet 11 03/01/2015 at Unknown time  . zinc gluconate 50 MG tablet Take 50 mg by mouth daily.   03/01/2015 at Unknown time  . ramipril (ALTACE) 2.5 MG capsule Take 2.5 mg by mouth at bedtime.      . ramipril (ALTACE) 5 MG capsule Take 5 mg by mouth every morning.       Assessment: 74 yo M on chronic Coumadin for AVR.  Home dose listed above.  INR was elevated on admission.  Hx multiple dose changes and fluctuating INR as outpatient.  He is also on Cipro & Flagyl this admission which can interact to elevate INR.  Recent rectal bleeding reported by patient- he is being followed by GI this admission.  No active bleeding noted at this time.  Heparin to be started to bridge Warfarin Rx pending procedure / GI workup.  Initial Heparin level is below goal.  Goal of Therapy:  INR 2.5-3.5   Plan:  Increase Heparin to 1500 units/hr  (No Bolus) Heparin level and CBC daily Daily PT/INR  [place] Crane, Nalla Purdy A 03/04/2015,6:12 PM

## 2015-03-04 NOTE — Progress Notes (Signed)
[email or web address] MD notified of patient's Hgb 7.7 this AM

## 2015-03-05 ENCOUNTER — Encounter (HOSPITAL_COMMUNITY): Payer: Self-pay | Admitting: Rehabilitation

## 2015-03-05 DIAGNOSIS — N189 Chronic kidney disease, unspecified: Secondary | ICD-10-CM

## 2015-03-05 DIAGNOSIS — E1122 Type 2 diabetes mellitus with diabetic chronic kidney disease: Secondary | ICD-10-CM

## 2015-03-05 DIAGNOSIS — I5022 Chronic systolic (congestive) heart failure: Secondary | ICD-10-CM | POA: Diagnosis present

## 2015-03-05 LAB — CBC
HCT: 30.5 % — ABNORMAL LOW (ref 39.0–52.0)
HEMOGLOBIN: 9.6 g/dL — AB (ref 13.0–17.0)
MCH: 24.2 pg — ABNORMAL LOW (ref 26.0–34.0)
MCHC: 31.5 g/dL (ref 30.0–36.0)
MCV: 76.8 fL — ABNORMAL LOW (ref 78.0–100.0)
Platelets: 293 10*3/uL (ref 150–400)
RBC: 3.97 MIL/uL — AB (ref 4.22–5.81)
RDW: 20.5 % — AB (ref 11.5–15.5)
WBC: 7.7 10*3/uL (ref 4.0–10.5)

## 2015-03-05 LAB — BASIC METABOLIC PANEL
ANION GAP: 6 (ref 5–15)
BUN: 14 mg/dL (ref 6–20)
CHLORIDE: 110 mmol/L (ref 101–111)
CO2: 24 mmol/L (ref 22–32)
Calcium: 8.5 mg/dL — ABNORMAL LOW (ref 8.9–10.3)
Creatinine, Ser: 0.85 mg/dL (ref 0.61–1.24)
GFR calc Af Amer: >60 mL/min (ref >60)
GFR calc non Af Amer: >60 mL/min (ref >60)
Glucose, Bld: 145 mg/dL — ABNORMAL HIGH (ref 65–99)
Potassium: 3.5 mmol/L (ref 3.5–5.1)
Sodium: 140 mmol/L (ref 135–145)

## 2015-03-05 LAB — HEPARIN LEVEL (UNFRACTIONATED)
HEPARIN UNFRACTIONATED: 0.32 [IU]/mL (ref 0.30–0.70)
Heparin Unfractionated: 0.21 IU/mL — ABNORMAL LOW (ref 0.30–0.70)

## 2015-03-05 LAB — GLUCOSE, CAPILLARY
Glucose-Capillary: 139 mg/dL — ABNORMAL HIGH (ref 65–99)
Glucose-Capillary: 152 mg/dL — ABNORMAL HIGH (ref 65–99)

## 2015-03-05 LAB — PROTIME-INR
INR: 1.92 — ABNORMAL HIGH (ref 0.00–1.49)
Prothrombin Time: 21.9 seconds — ABNORMAL HIGH (ref 11.6–15.2)

## 2015-03-05 MED ORDER — CIPROFLOXACIN IN D5W 200 MG/100ML IV SOLN
INTRAVENOUS | Status: AC
  Filled 2015-03-05: qty 100

## 2015-03-05 NOTE — Progress Notes (Signed)
[place] for Warfarin >> Heparin bridge for procedure Indication: ST JUDE AVR  No Known Allergies  Patient Measurements: Height: '5\' 10"'$  (177.8 cm) Weight: 205 lb 4 oz (93.1 kg) IBW/kg (Calculated) : 73  Vital Signs: Temp: 98.8 F (37.1 C) (07/17 0559) Temp Source: Oral (07/17 0559) BP: 129/87 mmHg (07/17 0559) Pulse Rate: 68 (07/17 0559)  Labs:  Recent Labs  03/03/15 0609 03/04/15 0619 03/04/15 1401 03/05/15 0607  HGB 7.4* 7.7*  --  9.6*  HCT 24.1* 24.9*  --  30.5*  PLT 278 293  --  293  LABPROT 32.9* 26.8*  --  21.9*  INR 3.30* 2.51*  --  1.92*  HEPARINUNFRC  --   --  0.11* 0.21*  CREATININE 1.03 0.86  --   --    Estimated Creatinine Clearance: 86.3 mL/min (by C-G formula based on Cr of 0.86).  Medications:  Prescriptions prior to admission  Medication Sig Dispense Refill Last Dose  . ACCU-CHEK AVIVA PLUS test strip    03/01/2015 at Unknown time  . albuterol (PROVENTIL) 4 MG tablet Take 4 mg by mouth 3 (three) times daily.   03/01/2015 at Unknown time  . albuterol-ipratropium (COMBIVENT) 18-103 MCG/ACT inhaler Inhale 1 puff into the lungs 4 (four) times daily. Coughing/ Shortness of Breath   03/01/2015 at Unknown time  . allopurinol (ZYLOPRIM) 300 MG tablet Take 300 mg by mouth at bedtime.    03/01/2015 at Unknown time  . Ascorbic Acid (VITAMIN C WITH ROSE HIPS) 1000 MG tablet Take 1,000 mg by mouth daily.   03/01/2015 at Unknown time  . cholecalciferol (VITAMIN D) 1000 UNITS tablet Take 2,000 Units by mouth daily.   03/01/2015 at Unknown time  . clopidogrel (PLAVIX) 75 MG tablet Take 1 tablet (75 mg total) by mouth daily with breakfast. 30 tablet 11 03/01/2015 at Unknown time  . CRESTOR 20 MG tablet TAKE (1) TABLET BY MOUTH AT BEDTIME FOR CHOLESTEROL. 30 tablet 3 03/01/2015 at Unknown time  . Emollient (EUCERIN) lotion Apply 10 mLs topically as needed for dry skin.   03/01/2015 at Unknown time  . fenofibrate (TRICOR) 145 MG tablet TAKE 1  TABLET BY MOUTH ONCE DAILY FOR CHOLESTEROL. (Patient taking differently: TAKE 1 TABLET BY MOUTH every evening FOR CHOLESTEROL.) 30 tablet 3 03/01/2015 at Unknown time  . ferrous sulfate 325 (65 FE) MG tablet Take 1 tablet (325 mg total) by mouth daily with breakfast. 30 tablet 1 03/01/2015 at Unknown time  . fish oil-omega-3 fatty acids 1000 MG capsule Take 1 capsule (1 g total) by mouth 2 (two) times daily.   03/01/2015 at Unknown time  . furosemide (LASIX) 80 MG tablet Take 1 tablet (80 mg total) by mouth daily.   03/01/2015 at Unknown time  . glimepiride (AMARYL) 1 MG tablet Take 1 mg by mouth 2 (two) times daily.   03/01/2015 at Unknown time  . isosorbide mononitrate (IMDUR) 30 MG 24 hr tablet TAKE ONE TABLET BY MOUTH ONCE DAILY. 30 tablet 5 03/01/2015 at Unknown time  . metoprolol succinate (TOPROL-XL) 50 MG 24 hr tablet Take 1 tablet (50 mg total) by mouth daily. Take with or immediately following a meal. 30 tablet 11 03/01/2015 at Unknown time  . nitroGLYCERIN (NITROSTAT) 0.4 MG SL tablet Place 1 tablet (0.4 mg total) under the tongue every 5 (five) minutes as needed for chest pain. 25 tablet 12 Unknown  . oxyCODONE-acetaminophen (PERCOCET) 10-325 MG per tablet Take 1 tablet by mouth every 4 (four)  hours as needed for pain. (Patient taking differently: Take 1 tablet by mouth 2 (two) times daily as needed for pain. ) 30 tablet 0 03/01/2015 at Unknown time  . pantoprazole (PROTONIX) 40 MG tablet Take 40 mg by mouth daily.   03/01/2015 at Unknown time  . potassium chloride SA (K-DUR,KLOR-CON) 20 MEQ tablet Take 20 mEq by mouth daily.     03/01/2015 at Unknown time  . pregabalin (LYRICA) 50 MG capsule Take one capsule by mouth twice daily for pains 60 capsule 5 03/01/2015 at Unknown time  . sitaGLIPtin (JANUVIA) 50 MG tablet Take 50 mg by mouth daily.   03/01/2015 at Unknown time  . sucralfate (CARAFATE) 1 G tablet Take 1 g by mouth 2 (two) times daily.    03/01/2015 at Unknown time  . warfarin (COUMADIN) 5 MG  tablet Take 1-1.5 tablets (5-7.5 mg total) by mouth at bedtime. Takes one-half tablet (7.'5mg'$  total) on Fridays. Takes one tablet ('5mg'$  total) on all other days (Patient taking differently: Take 2.5-5 mg by mouth at bedtime. Take 2.5 mg on Monday and Friday.  Take 5 mg all other days.) 30 tablet 11 03/01/2015 at Unknown time  . zinc gluconate 50 MG tablet Take 50 mg by mouth daily.   03/01/2015 at Unknown time  . ramipril (ALTACE) 2.5 MG capsule Take 2.5 mg by mouth at bedtime.      . ramipril (ALTACE) 5 MG capsule Take 5 mg by mouth every morning.       Assessment: 74 yo M on chronic Coumadin for AVR.  Home dose listed above.  INR was elevated on admission.  Hx multiple dose changes and fluctuating INR as outpatient.   Recent rectal bleeding reported by patient- he is being followed by GI this admission.  No active bleeding noted at this time.  Heparin to be started to bridge Warfarin Rx pending procedure / GI workup.  INR now < 2.  Heparin level remains below goal despite rate increase.  H/H and Platelets improved after transfusion.    Goal of Therapy:  INR 2.5-3.5 for Coumadin Rx Heparin level 0.3 - 0.7 for Heparin Rx   Plan:  Increase Heparin to 1800 units/hr  (No Bolus) Recheck Heparin level in 6 hrs Heparin level and CBC daily Daily PT/INR  [place] Crane, Bernadean Saling A 03/05/2015,7:47 AM

## 2015-03-05 NOTE — Progress Notes (Signed)
[place] for: Heparin bridge (warfarin pta) for procedure Indication: ST JUDE AVR  No Known Allergies  Patient Measurements: Height: 5' 10.5" (179.1 cm) Weight: 205 lb 4 oz (93.1 kg) (scale B) IBW/kg (Calculated) : 74.15  Heparin dosing weight: 92.8kg  Vital Signs: Temp: 98 F (36.7 C) (07/17 1324) Temp Source: Oral (07/17 1324) BP: 146/67 mmHg (07/17 1324) Pulse Rate: 68 (07/17 0559)  Labs:  Recent Labs  03/03/15 0609 03/04/15 0619 03/04/15 1401 03/05/15 0607 03/05/15 1531  HGB 7.4* 7.7*  --  9.6*  --   HCT 24.1* 24.9*  --  30.5*  --   PLT 278 293  --  293  --   LABPROT 32.9* 26.8*  --  21.9*  --   INR 3.30* 2.51*  --  1.92*  --   HEPARINUNFRC  --   --  0.11* 0.21* 0.32  CREATININE 1.03 0.86  --  0.85  --    Estimated Creatinine Clearance: 88.2 mL/min (by C-G formula based on Cr of 0.85).  Medications:  Prescriptions prior to admission  Medication Sig Dispense Refill Last Dose  . ACCU-CHEK AVIVA PLUS test strip    03/01/2015 at Unknown time  . albuterol (PROVENTIL) 4 MG tablet Take 4 mg by mouth 3 (three) times daily.   03/01/2015 at Unknown time  . albuterol-ipratropium (COMBIVENT) 18-103 MCG/ACT inhaler Inhale 1 puff into the lungs 4 (four) times daily. Coughing/ Shortness of Breath   03/01/2015 at Unknown time  . allopurinol (ZYLOPRIM) 300 MG tablet Take 300 mg by mouth at bedtime.    03/01/2015 at Unknown time  . Ascorbic Acid (VITAMIN C WITH ROSE HIPS) 1000 MG tablet Take 1,000 mg by mouth daily.   03/01/2015 at Unknown time  . cholecalciferol (VITAMIN D) 1000 UNITS tablet Take 2,000 Units by mouth daily.   03/01/2015 at Unknown time  . clopidogrel (PLAVIX) 75 MG tablet Take 1 tablet (75 mg total) by mouth daily with breakfast. 30 tablet 11 03/01/2015 at Unknown time  . CRESTOR 20 MG tablet TAKE (1) TABLET BY MOUTH AT BEDTIME FOR CHOLESTEROL. 30 tablet 3 03/01/2015 at Unknown time  . Emollient (EUCERIN) lotion Apply 10 mLs  topically as needed for dry skin.   03/01/2015 at Unknown time  . fenofibrate (TRICOR) 145 MG tablet TAKE 1 TABLET BY MOUTH ONCE DAILY FOR CHOLESTEROL. (Patient taking differently: TAKE 1 TABLET BY MOUTH every evening FOR CHOLESTEROL.) 30 tablet 3 03/01/2015 at Unknown time  . ferrous sulfate 325 (65 FE) MG tablet Take 1 tablet (325 mg total) by mouth daily with breakfast. 30 tablet 1 03/01/2015 at Unknown time  . fish oil-omega-3 fatty acids 1000 MG capsule Take 1 capsule (1 g total) by mouth 2 (two) times daily.   03/01/2015 at Unknown time  . furosemide (LASIX) 80 MG tablet Take 1 tablet (80 mg total) by mouth daily.   03/01/2015 at Unknown time  . glimepiride (AMARYL) 1 MG tablet Take 1 mg by mouth 2 (two) times daily.   03/01/2015 at Unknown time  . isosorbide mononitrate (IMDUR) 30 MG 24 hr tablet TAKE ONE TABLET BY MOUTH ONCE DAILY. 30 tablet 5 03/01/2015 at Unknown time  . metoprolol succinate (TOPROL-XL) 50 MG 24 hr tablet Take 1 tablet (50 mg total) by mouth daily. Take with or immediately following a meal. 30 tablet 11 03/01/2015 at Unknown time  . nitroGLYCERIN (NITROSTAT) 0.4 MG SL tablet Place 1 tablet (0.4 mg total) under the tongue every 5 (five)  minutes as needed for chest pain. 25 tablet 12 Unknown  . oxyCODONE-acetaminophen (PERCOCET) 10-325 MG per tablet Take 1 tablet by mouth every 4 (four) hours as needed for pain. (Patient taking differently: Take 1 tablet by mouth 2 (two) times daily as needed for pain. ) 30 tablet 0 03/01/2015 at Unknown time  . pantoprazole (PROTONIX) 40 MG tablet Take 40 mg by mouth daily.   03/01/2015 at Unknown time  . potassium chloride SA (K-DUR,KLOR-CON) 20 MEQ tablet Take 20 mEq by mouth daily.     03/01/2015 at Unknown time  . pregabalin (LYRICA) 50 MG capsule Take one capsule by mouth twice daily for pains 60 capsule 5 03/01/2015 at Unknown time  . sitaGLIPtin (JANUVIA) 50 MG tablet Take 50 mg by mouth daily.   03/01/2015 at Unknown time  . sucralfate (CARAFATE) 1  G tablet Take 1 g by mouth 2 (two) times daily.    03/01/2015 at Unknown time  . warfarin (COUMADIN) 5 MG tablet Take 1-1.5 tablets (5-7.5 mg total) by mouth at bedtime. Takes one-half tablet (7.'5mg'$  total) on Fridays. Takes one tablet ('5mg'$  total) on all other days (Patient taking differently: Take 2.5-5 mg by mouth at bedtime. Take 2.5 mg on Monday and Friday.  Take 5 mg all other days.) 30 tablet 11 03/01/2015 at Unknown time  . zinc gluconate 50 MG tablet Take 50 mg by mouth daily.   03/01/2015 at Unknown time  . ramipril (ALTACE) 2.5 MG capsule Take 2.5 mg by mouth at bedtime.      . ramipril (ALTACE) 5 MG capsule Take 5 mg by mouth every morning.       Assessment: Heparin for [facility]. Jude AVR (warfarin pta - INR goal 2.5-3.5). INR elevated on admission. Hx multiple dose changes and fluctuating INR as outpatient. Recent rectal bleeding reported- followed by GI this admission. No active bleeding noted at this time. Heparin when INR<2.5 pending procedure / GI workup.  INR now < 2.5. Hg improved 9.6 improved s/p 4u PRBC. Plt stable wnl. Continuing to hold warfarin. GI procedure scheduled 7/19.  HL 0.32  @ 1800 units/h   INR 2.51>>1.92 today PTA warfarin dose (on hold): 2.'5mg'$  Mon and Fri, '5mg'$  all other days   Goal of Therapy:  INR 2.5-3.5 for Coumadin Rx Heparin level 0.3 - 0.7 for Heparin Rx   Plan:  Heparin IV @ 1800 units/h  Daily HL/CBC  Mon s/sx bleeding  Hold warfarin - GI procedure scheduled 7/19  Levester Fresh, PharmD, BCPS Clinical Pharmacist Pager [phone] 03/05/2015 4:04 PM

## 2015-03-05 NOTE — Progress Notes (Addendum)
[facility] PROGRESS NOTE  LYNELL KUSSMAN PJK:[id number] DOB: 11-04-1940 DOA: 03/01/2015 PCP: [name], MD  HPI 74 y.o. Male admitted on 7/13 with severe abdominal pain in the setting of known advanced atherosclerotic disease and likely representing ischemic colitis. GI was consulted and patient underwent a repeat CTA on 7/14 with extensive aortoiliac atherosclerotic disease. Case was discussed with Dr. Laurence Ferrari from IR who will provide percutaneous intervention via stenting. Given mechanical valve and intervention in few days, patient's coumadin was discontinued and will bridge with heparin just prior to intervention. Patient will be transferred to [facility] on Sunday for IR evaluation Monday morning.   Subjective: - he is feeling well this morning, denies abdominal pain, nausea. No chest pain  Assessment/Plan: Abdominal pain  - patient with known arterial occlusive disease per CTA 2012, to result in mesenteric ischemia and ischemic ulcers of the colon. CTA 03/02/15 with dynamic changes fo distal small bowel consistent with ischemia.  - GI and IR discuss and recommend percutaneous intervention via stenting per GI note. To be scheduled for 03/07/15. Hold coumadin - I have discussed with Dr. Laurence Ferrari on 7/15, will transfer patient to [facility] today for him to evaluate on Monday and possible intervention on Tuesday. Discussed with Dr. Algis Liming who accepted patient in transfer  Acute blood loss anemia due to GI bleed due to ischemic colitis - patient has received a total of 4U pRBC , Hb went from 7.1 >> 9.6 - last bloody BM yesterday - GI consulted, GI discussed with IR as well regarding stenting - on ciprofloxacin and metronidazole Elevated INR  - he was supratherapeutic on admission likely worsening his GI bleeding - on coumadin for St Jude AVR. This is on hold for IR. - heparin gtt started 7/16 when his INR reached 2.5 Acute renal failure.  - Resolved.  Chronic  mesenteric ischemia:  - 2012 and 2016 CTAs. See #1.  CAD:  - no chest pain. Continue plavix and fenofibrate. Chronic systolic heart failure - resumed Lasix - he is euvolemic without LE edema or crackles DM2 (diabetes mellitus, type 2) - Holding amaryl. - Continue SSI. A1c 7.2 COPD (chronic obstructive pulmonary disease): stable, at baseline.  IDA (iron deficiency anemia): see above. Continue iron supplement  Code Status: full Family Communication: d/w family bedside Disposition Plan: to [facility] for IR evaluation  Consultants:  GI  IR  Procedures:  none  Antibiotics:  Cipro 03/01/15>>  Flagyl 03/01/15>>  Objective: Filed Vitals:   03/05/15 0559  BP: 129/87  Pulse: 68  Temp: 98.8 F (37.1 C)  Resp:     Intake/Output Summary (Last 24 hours) at 03/05/15 0932 Last data filed at 03/05/15 0800  Gross per 24 hour  Intake   1701 ml  Output   1300 ml  Net    401 ml   Filed Weights   03/03/15 1106 03/04/15 0600 03/05/15 0500  Weight: 93.5 kg (206 lb 2.1 oz) 94.3 kg (207 lb 14.3 oz) 93.1 kg (205 lb 4 oz)   Exam:   General:  Appears comfortable  HEENT: no scleral icterus  Cardiovascular: RRR left AKA. No right LE edema  Respiratory: normal effort BS distant but clear  Abdomen: obese non-distended mild tenderness RLQ  Musculoskeletal: no clubbing or cyanosis   Data Reviewed: Basic Metabolic Panel:  Recent Labs Lab 03/01/15 1544 03/01/15 2231 03/02/15 0609 03/03/15 0609 03/04/15 0619 03/05/15 0607  NA 134*  --  138 141 141 140  K 4.2  --  3.6 4.0  3.8 3.5  CL 101  --  107 111 114* 110  CO2 22  --  '22 24 24 24  '$ GLUCOSE 94 88 54* 136* 173* 145*  BUN 63*  --  56* 29* 17 14  CREATININE 1.93*  --  1.47* 1.03 0.86 0.85  CALCIUM 8.6*  --  8.4* 8.4* 8.0* 8.5*   Liver Function Tests:  Recent Labs Lab 03/01/15 1544 03/02/15 0609  AST 25 18  ALT 13* 12*  ALKPHOS 34* 28*  BILITOT 0.6 0.5  PROT 6.8 5.7*  ALBUMIN 3.1* 2.6*   CBC:  Recent Labs Lab  02/28/15 1204 03/01/15 1544 03/02/15 0609 03/03/15 0609 03/04/15 0619 03/05/15 0607  WBC 23.9* 17.5* 12.6* 9.9 9.9 7.7  NEUTROABS 22.1*  --   --   --   --   --   HGB  --  8.3* 7.1* 7.4* 7.7* 9.6*  HCT 25.7* 26.7* 23.1* 24.1* 24.9* 30.5*  MCV  --  73.2* 73.3* 74.8* 75.5* 76.8*  PLT  --  352 306 278 293 293   BNP (last 3 results)  Recent Labs  12/28/14 0134 02/04/15 0413  BNP 504.3* 593.2*    ProBNP (last 3 results)  Recent Labs  05/10/14 0229 05/11/14 1530  PROBNP 820.3* 1302.0*    CBG:  Recent Labs Lab 03/02/15 0919 03/02/15 1137 03/02/15 1646 03/02/15 2126 03/03/15 0742  GLUCAP 113* 120* 123* 126* 130*    Studies: No results found.  Scheduled Meds: . albuterol  4 mg Oral BID  . allopurinol  300 mg Oral QHS  . cholecalciferol  2,000 Units Oral Daily  . ciprofloxacin  200 mg Intravenous Q12H  . clopidogrel  75 mg Oral Q breakfast  . fenofibrate  160 mg Oral Daily  . ferrous sulfate  325 mg Oral Q breakfast  . furosemide  40 mg Oral Daily  . insulin aspart  0-5 Units Subcutaneous QHS  . insulin aspart  0-9 Units Subcutaneous TID WC  . ipratropium-albuterol  3 mL Inhalation BID  . isosorbide mononitrate  30 mg Oral Daily  . linagliptin  5 mg Oral Daily  . metoprolol succinate  50 mg Oral Daily  . metronidazole  500 mg Intravenous Q8H  . omega-3 acid ethyl esters  1 g Oral BID  . potassium chloride SA  20 mEq Oral Daily  . pregabalin  50 mg Oral BID  . rosuvastatin  20 mg Oral q1800  . sodium chloride  3 mL Intravenous Q12H  . sucralfate  1 g Oral BID  . vitamin C with rose hips  1,000 mg Oral Daily   Continuous Infusions: . heparin 1,800 Units/hr (03/05/15 0840)    Principal Problem:   Abdominal pain Active Problems:   H/O aortic valve replacement- St Jude   DM2 (diabetes mellitus, type 2)   COPD (chronic obstructive pulmonary disease)   HTN (hypertension)   S/P below knee amputation   Enteritis   Chronic mesenteric ischemia   IDA  (iron deficiency anemia)   Chronic anticoagulation   Acute blood loss anemia   Elevated INR   Mesenteric ischemia, chronic   Chronic systolic heart failure   Marzetta Board  [facility] Pager [phone]. If 7PM-7AM, please contact night-coverage at [email or web address], password [facility] 03/05/2015, 9:32 AM  LOS: 4 days

## 2015-03-05 NOTE — Progress Notes (Signed)
Report called to Margreta Journey, RN on [facility] at [facility].

## 2015-03-06 ENCOUNTER — Encounter (HOSPITAL_COMMUNITY): Payer: Self-pay | Admitting: Radiology

## 2015-03-06 ENCOUNTER — Inpatient Hospital Stay (HOSPITAL_COMMUNITY): Payer: Medicare Other

## 2015-03-06 DIAGNOSIS — E1169 Type 2 diabetes mellitus with other specified complication: Secondary | ICD-10-CM

## 2015-03-06 LAB — GLUCOSE, CAPILLARY
GLUCOSE-CAPILLARY: 187 mg/dL — AB (ref 65–99)
GLUCOSE-CAPILLARY: 161 mg/dL — AB (ref 65–99)
GLUCOSE-CAPILLARY: 141 mg/dL — AB (ref 65–99)
GLUCOSE-CAPILLARY: 142 mg/dL — AB (ref 65–99)
GLUCOSE-CAPILLARY: 108 mg/dL — AB (ref 65–99)
Glucose-Capillary: 225 mg/dL — ABNORMAL HIGH (ref 65–99)
Glucose-Capillary: 162 mg/dL — ABNORMAL HIGH (ref 65–99)
Glucose-Capillary: 160 mg/dL — ABNORMAL HIGH (ref 65–99)
Glucose-Capillary: 163 mg/dL — ABNORMAL HIGH (ref 65–99)
Glucose-Capillary: 138 mg/dL — ABNORMAL HIGH (ref 65–99)
Glucose-Capillary: 197 mg/dL — ABNORMAL HIGH (ref 65–99)
Glucose-Capillary: 167 mg/dL — ABNORMAL HIGH (ref 65–99)
Glucose-Capillary: 126 mg/dL — ABNORMAL HIGH (ref 65–99)

## 2015-03-06 LAB — BASIC METABOLIC PANEL
Anion gap: 6 (ref 5–15)
BUN: 12 mg/dL (ref 6–20)
CHLORIDE: 109 mmol/L (ref 101–111)
CO2: 26 mmol/L (ref 22–32)
CREATININE: 1.04 mg/dL (ref 0.61–1.24)
Calcium: 8.7 mg/dL — ABNORMAL LOW (ref 8.9–10.3)
GFR calc Af Amer: >60 mL/min (ref >60)
GLUCOSE: 159 mg/dL — AB (ref 65–99)
POTASSIUM: 3.5 mmol/L (ref 3.5–5.1)
Sodium: 141 mmol/L (ref 135–145)

## 2015-03-06 LAB — TYPE AND SCREEN
ABO/RH(D): A POS
ABO/RH(D): A POS
Antibody Screen: NEGATIVE
Antibody Screen: NEGATIVE
UNIT DIVISION: 0
UNIT DIVISION: 0
UNIT DIVISION: 0
Unit division: 0

## 2015-03-06 LAB — CBC
HCT: 32 % — ABNORMAL LOW (ref 39.0–52.0)
Hemoglobin: 9.9 g/dL — ABNORMAL LOW (ref 13.0–17.0)
MCH: 24 pg — ABNORMAL LOW (ref 26.0–34.0)
MCHC: 30.9 g/dL (ref 30.0–36.0)
MCV: 77.7 fL — ABNORMAL LOW (ref 78.0–100.0)
Platelets: 302 10*3/uL (ref 150–400)
RBC: 4.12 MIL/uL — ABNORMAL LOW (ref 4.22–5.81)
RDW: 21.6 % — ABNORMAL HIGH (ref 11.5–15.5)
WBC: 7 10*3/uL (ref 4.0–10.5)

## 2015-03-06 LAB — PROTIME-INR
INR: 1.73 — ABNORMAL HIGH (ref 0.00–1.49)
Prothrombin Time: 20.2 seconds — ABNORMAL HIGH (ref 11.6–15.2)

## 2015-03-06 LAB — POCT ACTIVATED CLOTTING TIME: ACTIVATED CLOTTING TIME: 153 s

## 2015-03-06 LAB — HEPARIN LEVEL (UNFRACTIONATED): Heparin Unfractionated: 0.42 IU/mL (ref 0.30–0.70)

## 2015-03-06 MED ORDER — MIDAZOLAM HCL 2 MG/2ML IJ SOLN
INTRAMUSCULAR | Status: AC
  Filled 2015-03-06: qty 2

## 2015-03-06 MED ORDER — LIDOCAINE HCL 1 % IJ SOLN
INTRAMUSCULAR | Status: AC
  Filled 2015-03-06: qty 20

## 2015-03-06 MED ORDER — FENTANYL CITRATE (PF) 100 MCG/2ML IJ SOLN
INTRAMUSCULAR | Status: AC
  Filled 2015-03-06: qty 2

## 2015-03-06 MED ORDER — SODIUM CHLORIDE 0.9 % IV SOLN
Freq: Once | INTRAVENOUS | Status: AC
  Administered 2015-03-06: 10:00:00 via INTRAVENOUS

## 2015-03-06 MED ORDER — FENTANYL CITRATE (PF) 100 MCG/2ML IJ SOLN
INTRAMUSCULAR | Status: DC | PRN
  Administered 2015-03-06 (×2): 25 ug via INTRAVENOUS
  Administered 2015-03-06 (×2): 50 ug via INTRAVENOUS

## 2015-03-06 MED ORDER — SODIUM CHLORIDE 0.9 % IV SOLN: Freq: Once | INTRAVENOUS | Status: AC

## 2015-03-06 MED ORDER — HEPARIN BOLUS VIA INFUSION
5000.0000 [IU] | Freq: Once | INTRAVENOUS | Status: AC
  Administered 2015-03-06: 5000 [IU] via INTRAVENOUS

## 2015-03-06 MED ORDER — MIDAZOLAM HCL 2 MG/2ML IJ SOLN
INTRAMUSCULAR | Status: DC | PRN
  Administered 2015-03-06 (×2): 0.5 mg via INTRAVENOUS
  Administered 2015-03-06: 1 mg via INTRAVENOUS
  Administered 2015-03-06: 0.5 mg via INTRAVENOUS

## 2015-03-06 NOTE — Sedation Documentation (Signed)
Vital signs stable. 

## 2015-03-06 NOTE — Progress Notes (Signed)
[facility] PROGRESS NOTE  Tanner Pena XNA:[id number] DOB: 1940/12/07 DOA: 03/01/2015 PCP: [name], MD  HPI 74 y.o. Male admitted on 7/13 with severe abdominal pain in the setting of known advanced atherosclerotic disease and likely representing ischemic colitis. GI was consulted and patient underwent a repeat CTA on 7/14 with extensive aortoiliac atherosclerotic disease. Case was discussed with Dr. Laurence Ferrari from IR who will provide percutaneous intervention via stenting. Given mechanical valve and intervention in few days, patient's coumadin was discontinued and will bridge with heparin just prior to intervention. Patient will be transferred to [facility] on Sunday for IR evaluation Monday morning.   Subjective: - he is feeling well this morning, denies abdominal pain, nausea, vomiting and chest pain, no SOB. 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 CTA 03/02/15 with dynamic changes for distal small bowel consistent with ischemia.  -will follow GI rec's - IR to place percutaneous stenting later today 7/18; will give 1 unit of FFP to help as INR 1.7 Acute blood loss anemia due to GI bleed due to ischemic colitis - patient has received a total of 4U pRBC , Hb went from 7.1 >> 9.6>> 9.9 - reports dark BM las night - IR looking to proceed with stent placement later this afternoon  - continue ciprofloxacin and metronidazole Elevated INR  - he was supratherapeutic on admission likely worsening his GI bleeding - on coumadin for [employer] AVR. This is on hold for IR. - heparin gtt started 7/16  -INR 1.7 today; needs to be around 1.5 for procedure Acute renal failure.  - Resolved.  -will monitor renal function CAD:  - no chest pain. Continue plavix and fenofibrate. Chronic systolic heart failure - continue  lasix -daily weight and follow up intake/output - he is euvolemic without LE edema or crackles DM2 (diabetes mellitus, type 2) - Holding amaryl while inpatient - Continue SSI.  -A1c 7.2 COPD (chronic obstructive pulmonary disease): stable, at baseline. Continue current regimen  IDA (iron deficiency anemia): see above. Continue iron supplement  Code Status: full Family Communication: d/w family bedside Disposition Plan: to [facility] for IR evaluation  Consultants:  GI  IR  Procedures:  none  Antibiotics:  Cipro 03/01/15>>  Flagyl 03/01/15>>  Objective: Filed Vitals:   03/06/15 1111  BP: 128/54  Pulse: 62  Temp: 98.2 F (36.8 C)  Resp: 18    Intake/Output Summary (Last 24 hours) at 03/06/15 1156 Last data filed at 03/06/15 0847  Gross per 24 hour  Intake 2325.5 ml  Output    200 ml  Net 2125.5 ml   Filed Weights   03/05/15 0500 03/05/15 1324 03/06/15 0628  Weight: 93.1 kg (205 lb 4 oz) 93.1 kg (205 lb 4 oz) 92.67 kg (204 lb 4.8 oz)   Exam:   General:  Appears comfortable, denies CP, no SOB  HEENT: no scleral icterus, no nystagmus   Cardiovascular: RRR, no rubs or gallops; positive mechanic click from artificial valve   Respiratory: normal effort BS distant but clear  Abdomen: obese, soft, denies tenderness, positive BS  Musculoskeletal: no clubbing or cyanosis; left AKA. No right LE edema  Data Reviewed: Basic Metabolic Panel:  Recent Labs Lab 03/02/15 0609 03/03/15 0609 03/04/15 0619 03/05/15 0607 03/06/15 0312  NA 138 141 141 140 141  K 3.6 4.0 3.8 3.5 3.5  CL 107  111 114* 110 109  CO2 '22 24 24 24 26  '$ GLUCOSE 54* 136* 173* 145* 159*  BUN 56* 29* '17 14 12  '$ CREATININE 1.47* 1.03 0.86 0.85 1.04  CALCIUM 8.4* 8.4* 8.0* 8.5* 8.7*   Liver Function Tests:  Recent Labs Lab 02/28/15 1204 03/01/15 1544 03/02/15 0609  AST '17 25 18  '$ ALT 9 13* 12*  ALKPHOS 33* 34* 28*  BILITOT 0.3 0.6 0.5  PROT 6.0 6.8 5.7*  ALBUMIN  --  3.1* 2.6*    CBC:  Recent Labs Lab 02/28/15 1204  03/02/15 0609 03/03/15 0609 03/04/15 0619 03/05/15 0607 03/06/15 0312  WBC 23.9*  < > 12.6* 9.9 9.9 7.7 7.0  NEUTROABS 22.1*  --   --   --   --   --   --   HGB  --   < > 7.1* 7.4* 7.7* 9.6* 9.9*  HCT 25.7*  < > 23.1* 24.1* 24.9* 30.5* 32.0*  MCV  --   < > 73.3* 74.8* 75.5* 76.8* 77.7*  PLT  --   < > 306 278 293 293 302  < > = values in this interval not displayed. BNP (last 3 results)  Recent Labs  12/28/14 0134 02/04/15 0413  BNP 504.3* 593.2*    ProBNP (last 3 results)  Recent Labs  05/10/14 0229 05/11/14 1530  PROBNP 820.3* 1302.0*    CBG:  Recent Labs Lab 03/05/15 1128 03/05/15 1615 03/05/15 2319 03/06/15 0645 03/06/15 1124  GLUCAP 197* 139* 152* 142* 167*    Studies: No results found.  Scheduled Meds: . albuterol  4 mg Oral BID  . allopurinol  300 mg Oral QHS  . cholecalciferol  2,000 Units Oral Daily  . ciprofloxacin  200 mg Intravenous Q12H  . clopidogrel  75 mg Oral Q breakfast  . fenofibrate  160 mg Oral Daily  . ferrous sulfate  325 mg Oral Q breakfast  . furosemide  40 mg Oral Daily  . insulin aspart  0-5 Units Subcutaneous QHS  . insulin aspart  0-9 Units Subcutaneous TID WC  . ipratropium-albuterol  3 mL Inhalation BID  . isosorbide mononitrate  30 mg Oral Daily  . linagliptin  5 mg Oral Daily  . metoprolol succinate  50 mg Oral Daily  . metronidazole  500 mg Intravenous Q8H  . omega-3 acid ethyl esters  1 g Oral BID  . potassium chloride SA  20 mEq Oral Daily  . pregabalin  50 mg Oral BID  . rosuvastatin  20 mg Oral q1800  . sodium chloride  3 mL Intravenous Q12H  . sucralfate  1 g Oral BID  . vitamin C with rose hips  1,000 mg Oral Daily   Continuous Infusions: . heparin 1,800 Units/hr (03/06/15 0559)    Principal Problem:   Abdominal pain Active Problems:   H/O aortic valve replacement- [employer] Jude   DM2 (diabetes mellitus, type 2)   COPD (chronic obstructive pulmonary disease)    HTN (hypertension)   S/P below knee amputation   Enteritis   Chronic mesenteric ischemia   IDA (iron deficiency anemia)   Chronic anticoagulation   Acute blood loss anemia   Elevated INR   Mesenteric ischemia, chronic   Chronic systolic heart failure   Time: 30 minutes  Barton Dubois  [facility] Pager [phone]. If 7PM-7AM, please contact night-coverage at [email or web address], password [email or web address] 03/06/2015, 11:56 AM  LOS: 5 days

## 2015-03-06 NOTE — Sedation Documentation (Signed)
Patient is restless

## 2015-03-06 NOTE — Sedation Documentation (Signed)
Patient is resting comfortably. 

## 2015-03-06 NOTE — Progress Notes (Signed)
Patient has arrived back to room 3E26. Patient alert and oriented x4 with no complaints at this time. Groin site unremarkable. CCMD notified of patients return. Patient HOB flat which will remain for 6 hours.

## 2015-03-06 NOTE — H&P (Signed)
Reason for Consult: Mesenteric ischemia   Chief Complaint: Chief Complaint  Patient presents with  . Abdominal Pain    Referring Physician(s): GI   History of Present Illness: Tanner Pena is a 74 y.o. male with PMHx significant for DM2, previous tobacco use, PAD, MI/CAD on plavix, CHF, S/p AVR on coumadin with complaints of chronic lower abdominal pain that has been worse over the past 3 weeks with "cramping" and worse with eating, therefore he has had a decrease in his appetite. He has had a CTA done with findings concerning for ischemia. He denies any abdominal pain today. He does state that he experienced tarry stools starting last week and the last episode he had was 2 days ago. He denies any BRBPR and underwent a EGD 02/09/15 that revealed normal findings. He denies any chest pain, shortness of breath or palpitations. He denies any current active signs of bleeding or excessive bruising. The patient denies any history of sleep apnea or chronic oxygen use. He has previously tolerated sedation without complications. He denies any known allergies or any difficulty with iodinated contrast.    Past Medical History  Diagnosis Date  . Diabetes mellitus 2007  . Hypertension   . Gout   . Hypercholesteremia   . Peripheral vascular disease     stents lower extremity  . COPD (chronic obstructive pulmonary disease)   . S/P aortic valve replacement 1990    a. [employer]  . Chronic back pain   . Dysphagia   . Neuromuscular disorder   . Peripheral neuropathy   . Critical lower limb ischemia   . CAD (coronary artery disease)     a. 05/13/14 [name] s/p overlapping DESx2 to SVG to RCA. b.  s/p CABG '90 with redo '94 & stent to RCA SVG in 2005  . Chronic toe ulcer     a. left foot  . Shortness of breath   . Myocardial infarction   . CHF (congestive heart failure)   . Stone in kidney   . GERD (gastroesophageal reflux disease)   . Arthritis   . Sleep apnea     tested greater than 7 years  ago per patient    Past Surgical History  Procedure Laterality Date  . Open heart surgery  1990    prosthetic heart valve, one bypass  . Rotator cuff repair      right  . Cataract extraction      bilateral  . Coronary stent placement  2005    RCA vein graft A 3.0x13.0 TAXUS stent was then placed int he vessel a Viva 3.0x4.0 (perfusion balloon was made ready it was placed through the entire lenght of the stent  . Peripheral vascular procedures lower extremities      right external iliac  artery PTA and stenting as well as bilateral SFA intervention remotely. Repeat procedures in 2011 bilaterally  . Coronary artery bypass graft  1994    6 vessels  . [name]  06/13/2011    Procedure: Venia Minks DILATION;  Surgeon: [name], MD;  Location: [facility];  Service: Endoscopy;  Laterality: N/A;  Dilated to 56.   . Angioplasty illiac artery    . Back surgery  [id number]    2  . Eye surgery    . Amputation Left 07/13/2014    Procedure: Transmetatarsal Amputation;  Surgeon: [name], MD;  Location: [place];  Service: Orthopedics;  Laterality: Left;  . Lower extremity angiogram N/A 02/15/2013  Procedure: LOWER EXTREMITY ANGIOGRAM;  Surgeon: [name], MD;  Location: [facility] CATH LAB;  Service: Cardiovascular;  Laterality: N/A;  . Left heart catheterization with coronary angiogram N/A 05/11/2014    Procedure: LEFT HEART CATHETERIZATION WITH CORONARY ANGIOGRAM;  Surgeon: [name], MD;  Location: [facility] CATH LAB;  Service: Cardiovascular;  Laterality: N/A;  . Percutaneous coronary stent intervention (pci-s) N/A 05/13/2014    Procedure: PERCUTANEOUS CORONARY STENT INTERVENTION (PCI-S);  Surgeon: [name], MD;  Location: [facility] CATH LAB;  Service: Cardiovascular;  Laterality: N/A;  . Lower extremity angiogram N/A 06/06/2014    Procedure: LOWER EXTREMITY ANGIOGRAM;  Surgeon: [name], MD;  Location: [facility] C[facility] LAB;  Service: Cardiovascular;  Laterality: N/A;  .  Amputation Left 08/20/2014    Procedure: Revision Transmetatarsal Amputation versus Below Knee Amputation;  Surgeon: [name], MD;  Location: [place];  Service: Orthopedics;  Laterality: Left;  . Stump revision Left 09/23/2014    Procedure: Revision Left Below Knee Amputation;  Surgeon: [name], MD;  Location: [place];  Service: Orthopedics;  Laterality: Left;  . Stump revision Left 10/13/2014    Procedure: REVISION LEFT BELOW KNEE AMPUTATION STUMP;  Surgeon: [name], MD;  Location: [facility];  Service: Orthopedics;  Laterality: Left;  . Esophagogastroduodenoscopy N/A 02/09/2015    DR. Schooler: Normal EGD  . Esophagogastroduodenoscopy  05/2011    Dr. [name]: s/p esophageal dilation, antral erosions/nodularity with benign biopsies  . Colonoscopy  05/2011    Dr. [name]: benign rectal polyp, left sided tics, ascending colonic ulcers (path c/w ischemia)    Allergies: Review of patient's allergies indicates no known allergies.  Medications: Prior to Admission medications   Medication Sig Start Date End Date Taking? Authorizing Provider  ACCU-CHEK AVIVA PLUS test strip  12/22/14  Yes Historical Provider, MD  albuterol (PROVENTIL) 4 MG tablet Take 4 mg by mouth 3 (three) times daily. 01/20/15  Yes Historical Provider, MD  albuterol-ipratropium (COMBIVENT) 18-103 MCG/ACT inhaler Inhale 1 puff into the lungs 4 (four) times daily. Coughing/ Shortness of Breath   Yes Historical Provider, MD  allopurinol (ZYLOPRIM) 300 MG tablet Take 300 mg by mouth at bedtime.    Yes Historical Provider, MD  Ascorbic Acid (VITAMIN C WITH ROSE HIPS) 1000 MG tablet Take 1,000 mg by mouth daily.   Yes Historical Provider, MD  cholecalciferol (VITAMIN D) 1000 UNITS tablet Take 2,000 Units by mouth daily.   Yes Historical Provider, MD  clopidogrel (PLAVIX) 75 MG tablet Take 1 tablet (75 mg total) by mouth daily with breakfast. 05/17/14  Yes Eileen Stanford, PA-C  CRESTOR 20 MG tablet TAKE (1) TABLET BY MOUTH AT  BEDTIME FOR CHOLESTEROL. 01/19/15  Yes [name], MD  Emollient (EUCERIN) lotion Apply 10 mLs topically as needed for dry skin.   Yes Historical Provider, MD  fenofibrate (TRICOR) 145 MG tablet TAKE 1 TABLET BY MOUTH ONCE DAILY FOR CHOLESTEROL. Patient taking differently: TAKE 1 TABLET BY MOUTH every evening FOR CHOLESTEROL. 01/19/15  Yes [name], MD  ferrous sulfate 325 (65 FE) MG tablet Take 1 tablet (325 mg total) by mouth daily with breakfast. 02/12/15  Yes Delfina Redwood, MD  fish oil-omega-3 fatty acids 1000 MG capsule Take 1 capsule (1 g total) by mouth 2 (two) times daily. 01/22/13  Yes Kristin L Alvstad, RPH-CPP  furosemide (LASIX) 80 MG tablet Take 1 tablet (80 mg total) by mouth daily. 02/12/15  Yes Delfina Redwood, MD  glimepiride (AMARYL)  1 MG tablet Take 1 mg by mouth 2 (two) times daily.   Yes Historical Provider, MD  isosorbide mononitrate (IMDUR) 30 MG 24 hr tablet TAKE ONE TABLET BY MOUTH ONCE DAILY. 10/25/14  Yes Troy Sine, MD  metoprolol succinate (TOPROL-XL) 50 MG 24 hr tablet Take 1 tablet (50 mg total) by mouth daily. Take with or immediately following a meal. 05/17/14  Yes Eileen Stanford, PA-C  nitroGLYCERIN (NITROSTAT) 0.4 MG SL tablet Place 1 tablet (0.4 mg total) under the tongue every 5 (five) minutes as needed for chest pain. 05/17/14  Yes Eileen Stanford, PA-C  oxyCODONE-acetaminophen (PERCOCET) 10-325 MG per tablet Take 1 tablet by mouth every 4 (four) hours as needed for pain. Patient taking differently: Take 1 tablet by mouth 2 (two) times daily as needed for pain.  09/23/14  Yes [name], MD  pantoprazole (PROTONIX) 40 MG tablet Take 40 mg by mouth daily.   Yes Historical Provider, MD  potassium chloride SA (K-DUR,KLOR-CON) 20 MEQ tablet Take 20 mEq by mouth daily.     Yes Historical Provider, MD  pregabalin (LYRICA) 50 MG capsule Take one capsule by mouth twice daily for pains 08/23/14  Yes Mahima Pandey, MD  sitaGLIPtin (JANUVIA) 50 MG  tablet Take 50 mg by mouth daily.   Yes Historical Provider, MD  sucralfate (CARAFATE) 1 G tablet Take 1 g by mouth 2 (two) times daily.  02/15/15  Yes Historical Provider, MD  warfarin (COUMADIN) 5 MG tablet Take 1-1.5 tablets (5-7.5 mg total) by mouth at bedtime. Takes one-half tablet (7.'5mg'$  total) on Fridays. Takes one tablet ('5mg'$  total) on all other days Patient taking differently: Take 2.5-5 mg by mouth at bedtime. Take 2.5 mg on Monday and Friday.  Take 5 mg all other days. 01/01/15  Yes Belkys A Regalado, MD  zinc gluconate 50 MG tablet Take 50 mg by mouth daily.   Yes Historical Provider, MD  ramipril (ALTACE) 2.5 MG capsule Take 2.5 mg by mouth at bedtime.  02/17/15   Historical Provider, MD  ramipril (ALTACE) 5 MG capsule Take 5 mg by mouth every morning.  02/17/15   Historical Provider, MD     Family History  Problem Relation Age of Onset  . Colon cancer Neg Hx   . Liver disease Neg Hx     History   Social History  . Marital Status: Legally Separated    Spouse Name: N/A  . Number of Children: 5  . Years of Education: N/A   Occupational History  . retired     Stage manager   Social History Main Topics  . Smoking status: Former Smoker -- 1.00 packs/day for 55 years    Types: Cigarettes    Start date: 08/19/1953    Quit date: 05/07/2014  . Smokeless tobacco: Current User    Types: Chew    Last Attempt to Quit: 08/20/1995     Comment: Has quit on 3 occasions. Counseling given today 5-10 minutes   I am more than likely going to quit "  . Alcohol Use: No     Comment: socially, sometimes 12 ounce beer daily, may go month without/no whiskey  . Drug Use: No  . Sexual Activity: Not on file   Other Topics Concern  . None   Social History Narrative   Has 3 daughters   Has 2 sons   Review of Systems: A 12 point ROS discussed and pertinent positives are indicated in the HPI above.  All other  systems are negative.  Review of Systems  Vital Signs: BP 128/54 mmHg  Pulse 62   Temp(Src) 98.2 F (36.8 C) (Oral)  Resp 18  Ht 5' 10.5" (1.791 m)  Wt 204 lb 4.8 oz (92.67 kg)  BMI 28.89 kg/m2  SpO2 96%  Physical Exam  Constitutional: He is oriented to person, place, and time. No distress.  HENT:  Head: Normocephalic and atraumatic.  Neck: No tracheal deviation present.  Cardiovascular: Normal rate and regular rhythm.  Exam reveals no gallop and no friction rub.   No murmur heard. (+) mechanical click  Pulmonary/Chest: Effort normal and breath sounds normal. No respiratory distress. He has no wheezes. He has no rales.  Abdominal: Soft. Bowel sounds are normal. He exhibits no distension. There is no tenderness.  Musculoskeletal: He exhibits no edema.  Left AKA  Neurological: He is alert and oriented to person, place, and time.  Skin: He is not diaphoretic.  Psychiatric: He has a normal mood and affect. His behavior is normal. Thought content normal.    Mallampati Score:  MD Evaluation Airway: WNL Heart: WNL Abdomen: WNL Chest/ Lungs: WNL ASA  Classification: 3 Mallampati/Airway Score: Two  Imaging: Ct Abdomen Pelvis Wo Contrast  02/05/2015   CLINICAL DATA:  Patient with epigastric and generalized abdominal pain for 2 weeks.  EXAM: CT ABDOMEN AND PELVIS WITHOUT CONTRAST  TECHNIQUE: Multidetector CT imaging of the abdomen and pelvis was performed following the standard protocol without IV contrast.  COMPARISON:  CT abdomen pelvis 04/22/2012  FINDINGS: Lower chest: Re- demonstrated pleural-based calcifications within the left lower hemi thorax. Normal heart size. Unchanged probable scarring left lower lobe.  Hepatobiliary: Liver is normal in size and contour without focal hepatic lesion identified. Gallbladder is unremarkable. No intrahepatic or extrahepatic biliary ductal dilatation.  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 Right adrenal gland is unremarkable. Re- demonstrated focal atrophy  of the inferior pole of the right kidney. 3 mm nonobstructing stone superior pole right kidney. Stable bilateral perinephric fat stranding. Urinary bladder is unremarkable. No ureterolithiasis. No hydronephrosis.  Stomach/Bowel: Small fat containing periumbilical hernia. There is mild wall thickening of the cecum. No significant surrounding fat stranding. Normal appendix. No evidence for bowel obstruction. Descending and sigmoid colonic diverticulosis without evidence for acute diverticulitis. No free fluid or free intraperitoneal air.  Vascular/Lymphatic: Extensive calcification of the abdominal aorta. No retroperitoneal lymphadenopathy.  Other: Fat containing periumbilical hernia.  Musculoskeletal: Lower lumbar spine degenerative changes. No aggressive or acute appearing osseous lesions.  IMPRESSION: Wall thickening of the cecum without significant surrounding inflammatory stranding. Findings may represent combination of underdistention and or possible focal colitis. Recommend correlation with colonoscopy in the nonacute setting to exclude the possibility of underlying mass.   Electronically Signed   By: Lovey Newcomer M.D.   On: 02/05/2015 22:04   Ct Abdomen Pelvis W Contrast  03/01/2015   CLINICAL DATA:  Generalized abdominal pain, several month duration. Diarrhea.  EXAM: CT ABDOMEN AND PELVIS WITH CONTRAST  TECHNIQUE: Multidetector CT imaging of the abdomen and pelvis was performed using the standard protocol following bolus administration of intravenous contrast.  CONTRAST:  42m OMNIPAQUE IOHEXOL 300 MG/ML  SOLN  COMPARISON:  02/05/2015  FINDINGS: There is chronic pleural and parenchymal scarring at the left base with calcification. No active chest disease is identified.  There is considerable motion degradation in the upper abdomen. The left lobe of the liver is prominent and there is slight lobulation of the  right lobe of the liver raising the possibility of early cirrhosis. There may be some sludge or  noncalcified stones dependent in the gallbladder. The spleen is normal. The pancreas is normal. The adrenal glands are normal. No renal parenchymal lesion is seen. There is atrophy at the lower pole the right kidney. There is a punctate stone in the upper pole the right kidney. The kidneys do not appear to be excreting contrast, 3 minutes after injection. There is advanced atherosclerosis of the aorta and its branch vessels. The IVC is normal.  There are several small bowel loops probably in the ileum that show wall thickening. Ischemic changes a concern given the degree of vascular disease. Inflammatory bowel disease or infectious inflammation could have a similar appearance. No perforation. No free fluid. The colon appears normal today.  The bladder is thick walled. The prostate is slightly enlarged. No pelvic lymphadenopathy. There are extensive chronic degenerative changes of the spine. There is a small ventral hernia containing only fat.  IMPRESSION: Abnormal thick walled loops of small intestine, probably within the ileum. 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 The differential diagnosis does include inflammatory bowel disease and infectious inflammation.  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  Poor excretion of contrast 3 minutes after injection suggesting the possibility of severely impaired renal function.  These results will be called to the ordering clinician or representative by the Radiologist Assistant, and communication documented in the PACS or zVision Dashboard.   Electronically Signed   By: Nelson Chimes M.D.   On: 03/01/2015 14:43   Ct Angio Abd/pel W/ And/or W/o  03/02/2015   CLINICAL DATA:  74 year old male with abdominal pain  EXAM: CTA ABDOMEN AND PELVIS wITHOUT AND WITH CONTRAST  TECHNIQUE: Multidetector CT imaging of the abdomen and pelvis was performed using the standard protocol during bolus administration of intravenous contrast.  Multiplanar reconstructed images and MIPs were obtained and reviewed to evaluate the vascular anatomy.  CONTRAST:  127m OMNIPAQUE IOHEXOL 350 MG/ML SOLN  COMPARISON:  CT dated 03/01/2015  FINDINGS: Focal left posterior lung base pleural-based calcification likely related to prior surgery or scarring. No intra-abdominal free air or free fluid.  The liver, gallbladder, pancreas, spleen, right adrenal gland appear unremarkable. 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 There is no hydronephrosis on this side. The visualized ureters and the urinary bladder appear grossly unremarkable. Coarse calcification of the prostate gland noted.  There is sigmoid diverticulosis without active inflammatory changes. Loose stool noted throughout the colon compatible with diarrheal state. Correlation with clinical exam and stool cultures recommended. There is segmental thickening of the distal ileum in the right hemipelvis which appears improved compared to the prior study compatible with residual segmental enteritis. There is no evidence of bowel obstruction. Normal appendix.  Extensive aortoiliac atherosclerotic disease. The origins of the celiac axis, SMA, and IMA as well as the origins of the renal arteries remain patent. No portal venous gas identified. There is no lymphadenopathy. There is mild haziness of the upper mesentery, likely reactive to inflammatory changes of the distal ileum.  Small fat containing umbilical hernia. Degenerative changes of the spine. No acute fracture.  Review of the MIP images confirms the above findings.  IMPRESSION: Segmental thickening of the distal ileum in the right hemipelvis compatible with enteritis. There has been interval improvement of the inflammatory  changes compared to the prior study. There is no evidence of bowel obstruction. No free air  or drainable fluid collection/ abscess identified.  Diarrheal state. Correlation with clinical exam and stool cultures recommended.   Electronically Signed   By: Anner Crete M.D.   On: 03/02/2015 19:06    Labs:  CBC:  Recent Labs  03/03/15 0609 03/04/15 0619 03/05/15 0607 03/06/15 0312  WBC 9.9 9.9 7.7 7.0  HGB 7.4* 7.7* 9.6* 9.9*  HCT 24.1* 24.9* 30.5* 32.0*  PLT 278 293 293 302    COAGS:  Recent Labs  08/20/14 0615  09/23/14 1303  10/13/14 0704  02/04/15 0415  03/03/15 0609 03/04/15 0619 03/05/15 0607 03/06/15 0312  INR 3.13*  < > 4.38*  < > 2.94*  < > 2.62*  < > 3.30* 2.51* 1.92* 1.73*  APTT 44*  --  53*  --  45*  --  36  --   --   --   --   --   < > = values in this interval not displayed.  BMP:  Recent Labs  03/03/15 0609 03/04/15 0619 03/05/15 0607 03/06/15 0312  NA 141 141 140 141  K 4.0 3.8 3.5 3.5  CL 111 114* 110 109  CO2 '24 24 24 26  '$ GLUCOSE 136* 173* 145* 159*  BUN 29* '17 14 12  '$ CALCIUM 8.4* 8.0* 8.5* 8.7*  CREATININE 1.03 0.86 0.85 1.04  GFRNONAA >60 >60 >60 >60  GFRAA >60 >60 >60 >60    LIVER FUNCTION TESTS:  Recent Labs  09/23/14 1303 12/28/14 0134 02/28/15 1204 03/01/15 1544 03/02/15 0609  BILITOT 0.6 0.8 0.3 0.6 0.5  AST '22 26 17 25 18  '$ ALT 12 13* 9 13* 12*  ALKPHOS 26* 30* 33* 34* 28*  PROT 5.7* 6.6 6.0 6.8 5.7*  ALBUMIN 3.3* 3.5  --  3.1* 2.6*    Assessment and Plan: Melena s/p EGD 02/09/15 with normal findings-last episode 7/16 Anemia, iron deficiency  Acute renal failure-resolved  History of AVR on coumadin-transitioned to heparin gtt for procedure, INR 1.73 will give FFP History of MI/CAD s/p stents on plavix Abdominal pain, CTA findings consistent with ischemic colitis  Admitted and scheduled for aortogram with visceral arteriogram and possible angioplasty/stenting.  The patient has been NPO, no blood thinners taken, labs and vitals have been reviewed. Risks and Benefits discussed with the patient including,  but not limited to bleeding, infection, vascular injury or contrast induced renal failure. All of the patient's questions were answered, patient is agreeable to proceed. Consent signed and in chart. PAD s/p Left AKA  DM2 COPD Previous tobacco use-quit in Sept 2015   Thank you for this interesting consult.  I greatly enjoyed meeting Tanner Pena and look forward to participating in their care.  SignedHedy Jacob 03/06/2015, 11:48 AM   I spent a total of 40 Minutes in face to face in clinical consultation, greater than 50% of which was counseling/coordinating care for mesenteric ischemia.

## 2015-03-06 NOTE — Sedation Documentation (Signed)
Family updated as to patient's status.

## 2015-03-06 NOTE — Sedation Documentation (Signed)
Patient c/o pain.  

## 2015-03-06 NOTE — Progress Notes (Signed)
[facility] for: Heparin bridge (warfarin pta) for procedure Indication: [facility] AVR  No Known Allergies  Patient Measurements: Height: 5' 10.5" (179.1 cm) Weight: 204 lb 4.8 oz (92.67 kg) (b scale) IBW/kg (Calculated) : 74.15  Heparin dosing weight: 92.8kg  Vital Signs: Temp: 98.7 F (37.1 C) (07/18 1312) Temp Source: Oral (07/18 1312) BP: 148/49 mmHg (07/18 1312) Pulse Rate: 58 (07/18 1312)  Labs:  Recent Labs  03/04/15 0619  03/05/15 0607 03/05/15 1531 03/06/15 0312  HGB 7.7*  --  9.6*  --  9.9*  HCT 24.9*  --  30.5*  --  32.0*  PLT 293  --  293  --  302  LABPROT 26.8*  --  21.9*  --  20.2*  INR 2.51*  --  1.92*  --  1.73*  HEPARINUNFRC  --   < > 0.21* 0.32 0.42  CREATININE 0.86  --  0.85  --  1.04  < > = values in this interval not displayed. Estimated Creatinine Clearance: 71.9 mL/min (by C-G formula based on Cr of 1.04).  Medications:  Prescriptions prior to admission  Medication Sig Dispense Refill Last Dose  . ACCU-CHEK AVIVA PLUS test strip    03/01/2015 at Unknown time  . albuterol (PROVENTIL) 4 MG tablet Take 4 mg by mouth 3 (three) times daily.   03/01/2015 at Unknown time  . albuterol-ipratropium (COMBIVENT) 18-103 MCG/ACT inhaler Inhale 1 puff into the lungs 4 (four) times daily. Coughing/ Shortness of Breath   03/01/2015 at Unknown time  . allopurinol (ZYLOPRIM) 300 MG tablet Take 300 mg by mouth at bedtime.    03/01/2015 at Unknown time  . Ascorbic Acid (VITAMIN C WITH ROSE HIPS) 1000 MG tablet Take 1,000 mg by mouth daily.   03/01/2015 at Unknown time  . cholecalciferol (VITAMIN D) 1000 UNITS tablet Take 2,000 Units by mouth daily.   03/01/2015 at Unknown time  . clopidogrel (PLAVIX) 75 MG tablet Take 1 tablet (75 mg total) by mouth daily with breakfast. 30 tablet 11 03/01/2015 at Unknown time  . CRESTOR 20 MG tablet TAKE (1) TABLET BY MOUTH AT BEDTIME FOR CHOLESTEROL. 30 tablet 3 03/01/2015 at Unknown time  . Emollient  (EUCERIN) lotion Apply 10 mLs topically as needed for dry skin.   03/01/2015 at Unknown time  . fenofibrate (TRICOR) 145 MG tablet TAKE 1 TABLET BY MOUTH ONCE DAILY FOR CHOLESTEROL. (Patient taking differently: TAKE 1 TABLET BY MOUTH every evening FOR CHOLESTEROL.) 30 tablet 3 03/01/2015 at Unknown time  . ferrous sulfate 325 (65 FE) MG tablet Take 1 tablet (325 mg total) by mouth daily with breakfast. 30 tablet 1 03/01/2015 at Unknown time  . fish oil-omega-3 fatty acids 1000 MG capsule Take 1 capsule (1 g total) by mouth 2 (two) times daily.   03/01/2015 at Unknown time  . furosemide (LASIX) 80 MG tablet Take 1 tablet (80 mg total) by mouth daily.   03/01/2015 at Unknown time  . glimepiride (AMARYL) 1 MG tablet Take 1 mg by mouth 2 (two) times daily.   03/01/2015 at Unknown time  . isosorbide mononitrate (IMDUR) 30 MG 24 hr tablet TAKE ONE TABLET BY MOUTH ONCE DAILY. 30 tablet 5 03/01/2015 at Unknown time  . metoprolol succinate (TOPROL-XL) 50 MG 24 hr tablet Take 1 tablet (50 mg total) by mouth daily. Take with or immediately following a meal. 30 tablet 11 03/01/2015 at Unknown time  . nitroGLYCERIN (NITROSTAT) 0.4 MG SL tablet Place 1 tablet (0.4 mg  total) under the tongue every 5 (five) minutes as needed for chest pain. 25 tablet 12 Unknown  . oxyCODONE-acetaminophen (PERCOCET) 10-325 MG per tablet Take 1 tablet by mouth every 4 (four) hours as needed for pain. (Patient taking differently: Take 1 tablet by mouth 2 (two) times daily as needed for pain. ) 30 tablet 0 03/01/2015 at Unknown time  . pantoprazole (PROTONIX) 40 MG tablet Take 40 mg by mouth daily.   03/01/2015 at Unknown time  . potassium chloride SA (K-DUR,KLOR-CON) 20 MEQ tablet Take 20 mEq by mouth daily.     03/01/2015 at Unknown time  . pregabalin (LYRICA) 50 MG capsule Take one capsule by mouth twice daily for pains 60 capsule 5 03/01/2015 at Unknown time  . sitaGLIPtin (JANUVIA) 50 MG tablet Take 50 mg by mouth daily.   03/01/2015 at Unknown  time  . sucralfate (CARAFATE) 1 G tablet Take 1 g by mouth 2 (two) times daily.    03/01/2015 at Unknown time  . warfarin (COUMADIN) 5 MG tablet Take 1-1.5 tablets (5-7.5 mg total) by mouth at bedtime. Takes one-half tablet (7.'5mg'$  total) on Fridays. Takes one tablet ('5mg'$  total) on all other days (Patient taking differently: Take 2.5-5 mg by mouth at bedtime. Take 2.5 mg on Monday and Friday.  Take 5 mg all other days.) 30 tablet 11 03/01/2015 at Unknown time  . zinc gluconate 50 MG tablet Take 50 mg by mouth daily.   03/01/2015 at Unknown time  . ramipril (ALTACE) 2.5 MG capsule Take 2.5 mg by mouth at bedtime.      . ramipril (ALTACE) 5 MG capsule Take 5 mg by mouth every morning.       Assessment: Heparin for [facility] (warfarin pta - INR goal 2.5-3.5). INR elevated on admission. Hx multiple dose changes and fluctuating INR as outpatient. Recent rectal bleeding reported- followed by GI this admission. No active bleeding noted at this time. Heparin when INR<2.5 pending procedure / GI workup.  INR now < 2.5. Hg improved 9.6 improved s/p 4u PRBC. Plt stable wnl. Continuing to hold warfarin. GI procedure scheduled 7/19.  HL 0.42 @ 1800 units/h   INR 2.51>>1.92>>1.73 today PTA warfarin dose (on hold): 2.'5mg'$  Mon and Fri, '5mg'$  all other days   Goal of Therapy:  INR 2.5-3.5 for Coumadin Rx Heparin level 0.3 - 0.7 for Heparin Rx   Plan:  Heparin IV @ 1800 units/h  Daily HL/CBC  Mon s/sx bleeding  Hold warfarin - GI procedure scheduled 7/19  Legrand Como, Pharm.D., BCPS, AAHIVP Clinical Pharmacist Phone: [phone] or [phone] 03/06/2015, 1:19 PM

## 2015-03-06 NOTE — Sedation Documentation (Signed)
Patient c/o pain, restless

## 2015-03-06 NOTE — Sedation Documentation (Signed)
Patient restless

## 2015-03-06 NOTE — Care Management Important Message (Signed)
Important Message  Patient Details  Name: Tanner Pena MRN: [id number] Date of Birth: 1941-04-07   Medicare Important Message Given:  Yes-third notification given    Pricilla Handler 03/06/2015, 3:03 PM

## 2015-03-06 NOTE — Procedures (Signed)
Interventional Radiology Procedure Note  Procedure: US guided right CFA access, Abd aortogram, SMA angiogram, covered stenting of prox SMA stenosis, with completion SMA angiogram  Findings: No significant stenosis of the SMA after covered stenting.  Complications: None Recommendations:  - 6 hours right leg straight after manual pressure closure - Ok to shower tomorrow  - Restart heparin ggt in 46mnutes - IV fluid hydration - Must stay on Plavix '75mg'$  PO daily for at least 3 months for the stent.  Would recommend lifetime, given atherosclerotic disease.   - Should stay on daily '81mg'$  ASA.  - Do not submerge leg for 7 days - Routine would care   - [facility] with mesenteric duplex exam. Would recommend 6 month mesenteric duplex, and then at 1 year.  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   Signed,  JDulcy Fanny WEarleen Newport DO

## 2015-03-07 ENCOUNTER — Encounter (HOSPITAL_COMMUNITY): Admit: 2015-03-07 | Payer: Medicare Other

## 2015-03-07 DIAGNOSIS — I771 Stricture of artery: Secondary | ICD-10-CM

## 2015-03-07 DIAGNOSIS — K551 Chronic vascular disorders of intestine: Secondary | ICD-10-CM | POA: Insufficient documentation

## 2015-03-07 LAB — CBC
HCT: 30.3 % — ABNORMAL LOW (ref 39.0–52.0)
HEMOGLOBIN: 9.4 g/dL — AB (ref 13.0–17.0)
MCH: 24.5 pg — AB (ref 26.0–34.0)
MCHC: 31 g/dL (ref 30.0–36.0)
MCV: 78.9 fL (ref 78.0–100.0)
Platelets: 289 10*3/uL (ref 150–400)
RBC: 3.84 MIL/uL — ABNORMAL LOW (ref 4.22–5.81)
RDW: 22.4 % — AB (ref 11.5–15.5)
WBC: 7.5 10*3/uL (ref 4.0–10.5)

## 2015-03-07 LAB — BASIC METABOLIC PANEL
Anion gap: 8 (ref 5–15)
BUN: 16 mg/dL (ref 6–20)
CO2: 23 mmol/L (ref 22–32)
Calcium: 8.4 mg/dL — ABNORMAL LOW (ref 8.9–10.3)
Chloride: 106 mmol/L (ref 101–111)
Creatinine, Ser: 0.93 mg/dL (ref 0.61–1.24)
GFR calc non Af Amer: >60 mL/min (ref >60)
Glucose, Bld: 144 mg/dL — ABNORMAL HIGH (ref 65–99)
Potassium: 3.4 mmol/L — ABNORMAL LOW (ref 3.5–5.1)
SODIUM: 137 mmol/L (ref 135–145)

## 2015-03-07 LAB — PREPARE FRESH FROZEN PLASMA: UNIT DIVISION: 0

## 2015-03-07 LAB — GLUCOSE, CAPILLARY
GLUCOSE-CAPILLARY: 173 mg/dL — AB (ref 65–99)
Glucose-Capillary: 147 mg/dL — ABNORMAL HIGH (ref 65–99)
Glucose-Capillary: 164 mg/dL — ABNORMAL HIGH (ref 65–99)
Glucose-Capillary: 150 mg/dL — ABNORMAL HIGH (ref 65–99)

## 2015-03-07 LAB — PROTIME-INR
INR: 1.51 — AB (ref 0.00–1.49)
PROTHROMBIN TIME: 18.3 s — AB (ref 11.6–15.2)

## 2015-03-07 LAB — HEPARIN LEVEL (UNFRACTIONATED)
Heparin Unfractionated: 0.18 IU/mL — ABNORMAL LOW (ref 0.30–0.70)
Heparin Unfractionated: 0.4 IU/mL (ref 0.30–0.70)

## 2015-03-07 NOTE — Progress Notes (Signed)
Patient ID: Tanner Pena, male   DOB: 19-Mar-1941, 74 y.o.   MRN: [id number]     Referring Physician(s): Rourk  Chief Complaint:  Abdominal Pain  Subjective:  Tanner Pena states he is feeling better today.  He has no abdominal pain at all now.  He is s/p SMA angioplasty/stent done by Dr. Earleen Newport yesterday (03/06/2015)  He states he has not had anything PO yet except for coffee, but he states he has "no trouble with it"  Allergies: Review of patient's allergies indicates no known allergies.  Medications: Prior to Admission medications   Medication Sig Start Date End Date Taking? Authorizing Provider  ACCU-CHEK AVIVA PLUS test strip  12/22/14  Yes Historical Provider, MD  albuterol (PROVENTIL) 4 MG tablet Take 4 mg by mouth 3 (three) times daily. 01/20/15  Yes Historical Provider, MD  albuterol-ipratropium (COMBIVENT) 18-103 MCG/ACT inhaler Inhale 1 puff into the lungs 4 (four) times daily. Coughing/ Shortness of Breath   Yes Historical Provider, MD  allopurinol (ZYLOPRIM) 300 MG tablet Take 300 mg by mouth at bedtime.    Yes Historical Provider, MD  Ascorbic Acid (VITAMIN C WITH ROSE HIPS) 1000 MG tablet Take 1,000 mg by mouth daily.   Yes Historical Provider, MD  cholecalciferol (VITAMIN D) 1000 UNITS tablet Take 2,000 Units by mouth daily.   Yes Historical Provider, MD  clopidogrel (PLAVIX) 75 MG tablet Take 1 tablet (75 mg total) by mouth daily with breakfast. 05/17/14  Yes Eileen Stanford, PA-C  CRESTOR 20 MG tablet TAKE (1) TABLET BY MOUTH AT BEDTIME FOR CHOLESTEROL. 01/19/15  Yes [name], MD  Emollient (EUCERIN) lotion Apply 10 mLs topically as needed for dry skin.   Yes Historical Provider, MD  fenofibrate (TRICOR) 145 MG tablet TAKE 1 TABLET BY MOUTH ONCE DAILY FOR CHOLESTEROL. Patient taking differently: TAKE 1 TABLET BY MOUTH every evening FOR CHOLESTEROL. 01/19/15  Yes [name], MD  ferrous sulfate 325 (65 FE) MG tablet Take 1 tablet (325 mg total) by mouth  daily with breakfast. 02/12/15  Yes Delfina Redwood, MD  fish oil-omega-3 fatty acids 1000 MG capsule Take 1 capsule (1 g total) by mouth 2 (two) times daily. 01/22/13  Yes Kristin L Alvstad, RPH-CPP  furosemide (LASIX) 80 MG tablet Take 1 tablet (80 mg total) by mouth daily. 02/12/15  Yes Delfina Redwood, MD  glimepiride (AMARYL) 1 MG tablet Take 1 mg by mouth 2 (two) times daily.   Yes Historical Provider, MD  isosorbide mononitrate (IMDUR) 30 MG 24 hr tablet TAKE ONE TABLET BY MOUTH ONCE DAILY. 10/25/14  Yes Troy Sine, MD  metoprolol succinate (TOPROL-XL) 50 MG 24 hr tablet Take 1 tablet (50 mg total) by mouth daily. Take with or immediately following a meal. 05/17/14  Yes Eileen Stanford, PA-C  nitroGLYCERIN (NITROSTAT) 0.4 MG SL tablet Place 1 tablet (0.4 mg total) under the tongue every 5 (five) minutes as needed for chest pain. 05/17/14  Yes Eileen Stanford, PA-C  oxyCODONE-acetaminophen (PERCOCET) 10-325 MG per tablet Take 1 tablet by mouth every 4 (four) hours as needed for pain. Patient taking differently: Take 1 tablet by mouth 2 (two) times daily as needed for pain.  09/23/14  Yes [name], MD  pantoprazole (PROTONIX) 40 MG tablet Take 40 mg by mouth daily.   Yes Historical Provider, MD  potassium chloride SA (K-DUR,KLOR-CON) 20 MEQ tablet Take 20 mEq by mouth daily.     Yes Historical Provider, MD  pregabalin (  LYRICA) 50 MG capsule Take one capsule by mouth twice daily for pains 08/23/14  Yes Mahima Pandey, MD  sitaGLIPtin (JANUVIA) 50 MG tablet Take 50 mg by mouth daily.   Yes Historical Provider, MD  sucralfate (CARAFATE) 1 G tablet Take 1 g by mouth 2 (two) times daily.  02/15/15  Yes Historical Provider, MD  warfarin (COUMADIN) 5 MG tablet Take 1-1.5 tablets (5-7.5 mg total) by mouth at bedtime. Takes one-half tablet (7.'5mg'$  total) on Fridays. Takes one tablet ('5mg'$  total) on all other days Patient taking differently: Take 2.5-5 mg by mouth at bedtime. Take 2.5 mg on Monday and  Friday.  Take 5 mg all other days. 01/01/15  Yes Belkys A Regalado, MD  zinc gluconate 50 MG tablet Take 50 mg by mouth daily.   Yes Historical Provider, MD  ramipril (ALTACE) 2.5 MG capsule Take 2.5 mg by mouth at bedtime.  02/17/15   Historical Provider, MD  ramipril (ALTACE) 5 MG capsule Take 5 mg by mouth every morning.  02/17/15   Historical Provider, MD     Vital Signs: BP 132/54 mmHg  Pulse 70  Temp(Src) 98 F (36.7 C) (Oral)  Resp 16  Ht 5' 10.5" (1.791 m)  Wt 204 lb 4.8 oz (92.67 kg)  BMI 28.89 kg/m2  SpO2 100%  Physical Exam   Awake and Alert Right groin stick site OK, no bleeding, no hematoma, no pseudoaneurysm Abdomen Soft, NTND + Arterial Doppler signals Right foot.  Imaging: Ir Angiogram Visceral Selective  03/06/2015   CLINICAL DATA:  74 year old male with a history of signs and symptoms of chronic mesenteric ischemia, with abdominal pain and evidence of ischemic colitis on prior colonoscopy.  He has been referred for evaluation of angiogram and possible stenting of the superior mesenteric artery.  EXAM: ULTRASOUND GUIDED ACCESS OF RIGHT COMMON FEMORAL ARTERY.  AORTIC ANGIOGRAM  SUPERIOR MESENTERIC ARTERY ANGIOGRAM  COVERED STENTING OF SIGNIFICANT STENOSIS OF THE SUPERIOR MESENTERIC ARTERY ORIGIN, WITH REPEAT SUPERIOR MESENTERIC ARTERY ANGIOGRAM  ANESTHESIA/SEDATION: 3.0 Mg IV Versed; 175 mcg IV Fentanyl.  Total Moderate Sedation Time: 120 minutes  Additional Medications: 5000 units intraoperative heparin bolus  FLUOROSCOPY TIME:  26 minutes 30 seconds  PROCEDURE: The procedure, risks, benefits, and alternatives were explained to the patient and to the patient's family. Questions regarding the procedure were encouraged and answered. Specific risks discussed include: Bleeding, infection, renal artery injury, contrast reaction, possible dialysis, arterial injury/ dissection, closure of the superior mesenteric artery, need for further procedure or surgery, disability,  cardiopulmonary collapse, death. The patient understands and consents to the procedure.  The right groin was prepped with Betadine in a sterile fashion, and a sterile drape was applied covering the operative field. A sterile gown and sterile gloves were used for the procedure. Local anesthesia was provided with 1% Lidocaine.  Ultrasound survey of the right inguinal region was performed with images stored and sent to PACs.  A micropuncture needle was used access the right common femoral artery under ultrasound. With excellent arterial blood flow returned, and an 018 micro wire was passed through the needle, observed enter the abdominal aorta under fluoroscopy. The needle was removed, and a micropuncture sheath was placed over the wire. The inner dilator and wire were removed, and an 035 Bentson wire was advanced under fluoroscopy into the abdominal aorta. The sheath was removed and a standard 5 [place] vascular sheath was placed. The dilator was removed and the sheath was flushed.  Five French pigtail Flush catheter was advanced into  the aorta and an aortogram was completed, including lateral image.  The Amplatz wire was then placed through the pigtail catheter and a 6 French flexor Raabe sheath was placed.  Cobra catheter and Glidewire were used to select the superior mesenteric artery. Superior mesenteric artery angiogram was performed.  Once a Glidewire was across the stenosis, a 4 French glide cath was advanced into the distal superior mesenteric artery. A Rosen wire was then advanced into the artery. A combination of the Rosen wire, a 4 French glide cath, an 035 Amplatz wire, and the Cobra catheter were used to sequentially balloon angioplasty the stenotic origin of the superior mesenteric artery with a 4 mm diameter balloon and subsequently a 5 mm diameter balloon.  Once angioplasty had been performed, overlapping atrium balloon mounted covered stents were deployed across the disease of the proximal superior  mesenteric artery. Distally a 5 mm x 22 mm covered stent was deployed with post dilation. Approximately a 6 mm x 22 mm covered stent was deployed extending slightly beyond the ostium, and overlapping the more distal stent by just less than 1 cm.  The stent construct was post dilated to 6 mm, and the stent at the ostium was post dilated to 8 mm.  Final angiogram was performed.  Patient tolerated the procedure well and remained hemodynamically stable throughout.  No complications were encountered and no significant blood loss was encountered.  Manual pressure was used for hemostasis at the right common femoral artery.  COMPLICATIONS: None.  FINDINGS: Abdominal aorta g demonstrates atherosclerotic changes along the length of the aorta. No aneurysm or dissection flap is identified. Dense calcium present along the distal abdominal aorta.  Bilateral renal arteries are patent. There is approximately 50% stenosis at the origin of the left renal artery.  Temporarily, the celiac artery fills with the renal arteries, with delayed filling of the superior mesenteric artery, compatible with hemodynamically significant stenosis.  Distally, there is 2 directional flow at the inferior mesenteric artery, likely with collateral filling from the left call it artery and the superior rectal artery.  Lumbar arteries are patent.  Extensive atherosclerotic changes of the bilateral iliac system. Bilateral hypogastric arteries are approximately 50% stenotic at the origins though patent distally. There is a stent at the distal right external iliac artery within the pelvis, with in stent stenosis of approximately 50% narrowing. The sheath was not occlusive during the case. Right common femoral artery atherosclerotic changes are greater than the left common femoral artery.  Plaque at the proximal 3 cm of the superior mesenteric artery contributes to significant stenosis and decreased filling though the artery remains patent. There is  approximately 80 degree angulation beyond the first 4 cm of the superior mesenteric artery, of the location of significant calcification.  Status post balloon angioplasty and stenting of the proximal superior mesenteric artery there is resolution of the previous identified stenosis. Calcified plaque beyond the stents do not contribute to significant stenosis with improved filling. Further balloon angioplasty and stenting was not performed at this angulation of the artery to improve the angiographic outcome given the increased risk for dissection at this contour.  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  Angiogram demonstrates extensive changes of atherosclerotic disease involving the aorta and mesenteric vessels, including less than 50% stenosis of the proximal left renal artery, hemodynamically significant stenosis at the origin  of the inferior mesenteric artery, and significant disease of the bilateral iliac systems.  Signed,  Dulcy Fanny. Earleen Newport, DO  [facility]  [facility]  PLAN: The patient will have his right leg straight for 6 hours status post manual compression for hemostasis at the puncture site.  Hydration for 8 hours with normal saline.  Recommend continued Plavix for at least 3 months after the procedure for stent patency. 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   Electronically Signed   By: Corrie Mckusick D.O.   On: 03/06/2015 18:24   Ir US Guide Vasc Access Right  03/06/2015   CLINICAL DATA:  74 year old male with a history of signs and symptoms of chronic mesenteric ischemia, with abdominal pain and evidence of ischemic  colitis on prior colonoscopy.  He has been referred for evaluation of angiogram and possible stenting of the superior mesenteric artery.  EXAM: ULTRASOUND GUIDED ACCESS OF RIGHT COMMON FEMORAL ARTERY.  AORTIC ANGIOGRAM  SUPERIOR MESENTERIC ARTERY ANGIOGRAM  COVERED STENTING OF SIGNIFICANT STENOSIS OF THE SUPERIOR MESENTERIC ARTERY ORIGIN, WITH REPEAT SUPERIOR MESENTERIC ARTERY ANGIOGRAM  ANESTHESIA/SEDATION: 3.0 Mg IV Versed; 175 mcg IV Fentanyl.  Total Moderate Sedation Time: 120 minutes  Additional Medications: 5000 units intraoperative heparin bolus  FLUOROSCOPY TIME:  26 minutes 30 seconds  PROCEDURE: The procedure, risks, benefits, and alternatives were explained to the patient and to the patient's family. Questions regarding the procedure were encouraged and answered. Specific risks discussed include: Bleeding, infection, renal artery injury, contrast reaction, possible dialysis, arterial injury/ dissection, closure of the superior mesenteric artery, need for further procedure or surgery, disability, cardiopulmonary collapse, death. The patient understands and consents to the procedure.  The right groin was prepped with Betadine in a sterile fashion, and a sterile drape was applied covering the operative field. A sterile gown and sterile gloves were used for the procedure. Local anesthesia was provided with 1% Lidocaine.  Ultrasound survey of the right inguinal region was performed with images stored and sent to PACs.  A micropuncture needle was used access the right common femoral artery under ultrasound. With excellent arterial blood flow returned, and an 018 micro wire was passed through the needle, observed enter the abdominal aorta under fluoroscopy. The needle was removed, and a micropuncture sheath was placed over the wire. The inner dilator and wire were removed, and an 035 Bentson wire was advanced under fluoroscopy into the abdominal aorta. The sheath was removed and a standard 5 [place] vascular  sheath was placed. The dilator was removed and the sheath was flushed.  Five French pigtail Flush catheter was advanced into the aorta and an aortogram was completed, including lateral image.  The Amplatz wire was then placed through the pigtail catheter and a 6 French flexor Raabe sheath was placed.  Cobra catheter and Glidewire were used to select the superior mesenteric artery. Superior mesenteric artery angiogram was performed.  Once a Glidewire was across the stenosis, a 4 French glide cath was advanced into the distal superior mesenteric artery. A Rosen wire was then advanced into the artery. A combination of the Rosen wire, a 4 French glide cath, an 035 Amplatz wire, and the Cobra catheter were used to sequentially balloon angioplasty the stenotic origin of the superior mesenteric artery with a 4 mm diameter balloon and subsequently a 5 mm diameter balloon.  Once angioplasty had been performed, overlapping atrium balloon mounted  covered stents were deployed across the disease of the proximal superior mesenteric artery. Distally a 5 mm x 22 mm covered stent was deployed with post dilation. Approximately a 6 mm x 22 mm covered stent was deployed extending slightly beyond the ostium, and overlapping the more distal stent by just less than 1 cm.  The stent construct was post dilated to 6 mm, and the stent at the ostium was post dilated to 8 mm.  Final angiogram was performed.  Patient tolerated the procedure well and remained hemodynamically stable throughout.  No complications were encountered and no significant blood loss was encountered.  Manual pressure was used for hemostasis at the right common femoral artery.  COMPLICATIONS: None.  FINDINGS: Abdominal aorta g demonstrates atherosclerotic changes along the length of the aorta. No aneurysm or dissection flap is identified. Dense calcium present along the distal abdominal aorta.  Bilateral renal arteries are patent. There is approximately 50% stenosis at the  origin of the left renal artery.  Temporarily, the celiac artery fills with the renal arteries, with delayed filling of the superior mesenteric artery, compatible with hemodynamically significant stenosis.  Distally, there is 2 directional flow at the inferior mesenteric artery, likely with collateral filling from the left call it artery and the superior rectal artery.  Lumbar arteries are patent.  Extensive atherosclerotic changes of the bilateral iliac system. Bilateral hypogastric arteries are approximately 50% stenotic at the origins though patent distally. There is a stent at the distal right external iliac artery within the pelvis, with in stent stenosis of approximately 50% narrowing. The sheath was not occlusive during the case. Right common femoral artery atherosclerotic changes are greater than the left common femoral artery.  Plaque at the proximal 3 cm of the superior mesenteric artery contributes to significant stenosis and decreased filling though the artery remains patent. There is approximately 80 degree angulation beyond the first 4 cm of the superior mesenteric artery, of the location of significant calcification.  Status post balloon angioplasty and stenting of the proximal superior mesenteric artery there is resolution of the previous identified stenosis. Calcified plaque beyond the stents do not contribute to significant stenosis with improved filling. Further balloon angioplasty and stenting was not performed at this angulation of the artery to improve the angiographic outcome given the increased risk for dissection at this contour.  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  Angiogram demonstrates extensive changes of atherosclerotic disease involving the aorta and mesenteric  vessels, including less than 50% stenosis of the proximal left renal artery, hemodynamically significant stenosis at the origin of the inferior mesenteric artery, and significant disease of the bilateral iliac systems.  Signed,  Dulcy Fanny. Earleen Newport, DO  [facility]  [facility]  PLAN: The patient will have his right leg straight for 6 hours status post manual compression for hemostasis at the puncture site.  Hydration for 8 hours with normal saline.  Recommend continued Plavix for at least 3 months after the procedure for stent patency. 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   Electronically Signed   By: Corrie Mckusick D.O.   On: 03/06/2015 18:24   Ir [facility] [employer] Stent 1st Art Not Therese Sarah Car Vert Car  03/06/2015   CLINICAL DATA:  74 year old male with a history of signs and symptoms of chronic mesenteric ischemia, with abdominal pain and evidence of ischemic colitis on prior colonoscopy.  He has been referred for evaluation of angiogram and possible stenting of the superior mesenteric artery.  EXAM: ULTRASOUND GUIDED ACCESS OF RIGHT COMMON FEMORAL ARTERY.  AORTIC ANGIOGRAM  SUPERIOR MESENTERIC ARTERY ANGIOGRAM  COVERED STENTING OF SIGNIFICANT STENOSIS OF THE SUPERIOR MESENTERIC ARTERY ORIGIN, WITH REPEAT SUPERIOR MESENTERIC ARTERY ANGIOGRAM  ANESTHESIA/SEDATION: 3.0 Mg IV Versed; 175 mcg IV Fentanyl.  Total Moderate Sedation Time: 120 minutes  Additional Medications: 5000 units intraoperative heparin bolus  FLUOROSCOPY TIME:  26 minutes 30 seconds  PROCEDURE: The procedure, risks, benefits, and alternatives were explained to the patient and to the patient's family. Questions regarding the procedure were encouraged and answered. Specific risks discussed include: Bleeding, infection, renal artery injury, contrast reaction, possible dialysis, arterial  injury/ dissection, closure of the superior mesenteric artery, need for further procedure or surgery, disability, cardiopulmonary collapse, death. The patient understands and consents to the procedure.  The right groin was prepped with Betadine in a sterile fashion, and a sterile drape was applied covering the operative field. A sterile gown and sterile gloves were used for the procedure. Local anesthesia was provided with 1% Lidocaine.  Ultrasound survey of the right inguinal region was performed with images stored and sent to PACs.  A micropuncture needle was used access the right common femoral artery under ultrasound. With excellent arterial blood flow returned, and an 018 micro wire was passed through the needle, observed enter the abdominal aorta under fluoroscopy. The needle was removed, and a micropuncture sheath was placed over the wire. The inner dilator and wire were removed, and an 035 Bentson wire was advanced under fluoroscopy into the abdominal aorta. The sheath was removed and a standard 5 [place] vascular sheath was placed. The dilator was removed and the sheath was flushed.  Five French pigtail Flush catheter was advanced into the aorta and an aortogram was completed, including lateral image.  The Amplatz wire was then placed through the pigtail catheter and a 6 French flexor Raabe sheath was placed.  Cobra catheter and Glidewire were used to select the superior mesenteric artery. Superior mesenteric artery angiogram was performed.  Once a Glidewire was across the stenosis, a 4 French glide cath was advanced into the distal superior mesenteric artery. A Rosen wire was then advanced into the artery. A combination of the Rosen wire, a 4 French glide cath, an 035 Amplatz wire, and the Cobra catheter were used to sequentially balloon angioplasty the stenotic origin of the superior mesenteric artery with a 4 mm diameter balloon and subsequently a 5 mm diameter balloon.  Once angioplasty had been  performed, overlapping atrium balloon mounted covered stents were deployed across the disease of the proximal superior mesenteric artery. Distally a 5 mm x 22 mm covered stent was deployed with post dilation. Approximately a 6 mm x 22 mm covered stent was deployed extending slightly beyond the ostium, and overlapping the more distal stent by just less than 1 cm.  The stent construct was post dilated to 6 mm, and the stent at the ostium was post dilated to 8 mm.  Final angiogram was performed.  Patient tolerated the procedure well and remained hemodynamically stable throughout.  No complications were encountered and no significant blood loss was encountered.  Manual pressure was used for hemostasis at the right common femoral artery.  COMPLICATIONS: None.  FINDINGS: Abdominal aorta g demonstrates atherosclerotic changes  along the length of the aorta. No aneurysm or dissection flap is identified. Dense calcium present along the distal abdominal aorta.  Bilateral renal arteries are patent. There is approximately 50% stenosis at the origin of the left renal artery.  Temporarily, the celiac artery fills with the renal arteries, with delayed filling of the superior mesenteric artery, compatible with hemodynamically significant stenosis.  Distally, there is 2 directional flow at the inferior mesenteric artery, likely with collateral filling from the left call it artery and the superior rectal artery.  Lumbar arteries are patent.  Extensive atherosclerotic changes of the bilateral iliac system. Bilateral hypogastric arteries are approximately 50% stenotic at the origins though patent distally. There is a stent at the distal right external iliac artery within the pelvis, with in stent stenosis of approximately 50% narrowing. The sheath was not occlusive during the case. Right common femoral artery atherosclerotic changes are greater than the left common femoral artery.  Plaque at the proximal 3 cm of the superior mesenteric  artery contributes to significant stenosis and decreased filling though the artery remains patent. There is approximately 80 degree angulation beyond the first 4 cm of the superior mesenteric artery, of the location of significant calcification.  Status post balloon angioplasty and stenting of the proximal superior mesenteric artery there is resolution of the previous identified stenosis. Calcified plaque beyond the stents do not contribute to significant stenosis with improved filling. Further balloon angioplasty and stenting was not performed at this angulation of the artery to improve the angiographic outcome given the increased risk for dissection at this contour.  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  Signed,  Dulcy Fanny. Earleen Newport, DO  [facility]  [facility]  PLAN: The patient will have his right leg straight for 6 hours status post manual compression for hemostasis at the puncture site.  Hydration for 8 hours with normal saline.  Recommend continued Plavix for at least 3 months after the procedure for stent patency. 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   Electronically Signed   By: Corrie Mckusick D.O.   On: 03/06/2015 18:24    Labs:  CBC:  Recent Labs  03/04/15 0619 03/05/15 0607 03/06/15 0312  03/07/15 0416  WBC 9.9 7.7 7.0 7.5  HGB 7.7* 9.6* 9.9* 9.4*  HCT 24.9* 30.5* 32.0* 30.3*  PLT 293 293 302 289    COAGS:  Recent Labs  08/20/14 0615  09/23/14 1303  10/13/14 0704  02/04/15 0415  03/04/15 0619 03/05/15 0607 03/06/15 0312 03/07/15 0416  INR 3.13*  < > 4.38*  < > 2.94*  < > 2.62*  < > 2.51* 1.92* 1.73* 1.51*  APTT 44*  --  53*  --  45*  --  36  --   --   --   --   --   < > = values in this interval not displayed.  BMP:  Recent Labs  03/04/15 0619 03/05/15 0607 03/06/15 0312 03/07/15 0416  NA 141 140  141 137  K 3.8 3.5 3.5 3.4*  CL 114* 110 109 106  CO2 '24 24 26 23  '$ GLUCOSE 173* 145* 159* 144*  BUN '17 14 12 16  '$ CALCIUM 8.0* 8.5* 8.7* 8.4*  CREATININE 0.86 0.85 1.04 0.93  GFRNONAA >60 >60 >60 >60  GFRAA >60 >60 >60 >60    LIVER FUNCTION TESTS:  Recent Labs  09/23/14 1303 12/28/14 0134 02/28/15 1204 03/01/15 1544 03/02/15 0609  BILITOT 0.6 0.8 0.3 0.6 0.5  AST '22 26 17 25 18  '$ ALT 12 13* 9 13* 12*  ALKPHOS 26* 30* 33* 34* 28*  PROT 5.7* 6.6 6.0 6.8 5.7*  ALBUMIN 3.3* 3.5  --  3.1* 2.6*    Assessment and Plan:  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 Would recommend lifetime, given atherosclerotic disease.   Should stay on daily '81mg'$  ASA.   Do not submerge leg for 7 days  Follow up with Dr Earleen Newport in [facility] in 2-4 weeks after DC home with mesenteric duplex exam.   Would recommend 6 month mesenteric duplex, and then at 1 year. 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   Signed: Murrell Redden PA-C 03/07/2015, 10:26 AM   I spent a total of 15 Minutes in face to face in clinical consultation/evaluation, greater than 50% of which was counseling/coordinating care for angioplasty/Stent SMA.

## 2015-03-07 NOTE — Progress Notes (Signed)
[facility] PROGRESS NOTE  Tanner Pena UXL:[id number] DOB: 1940-09-29 DOA: 03/01/2015 PCP: [name], MD  HPI 74 y.o. Male admitted on 7/13 with severe abdominal pain in the setting of known advanced atherosclerotic disease and likely representing ischemic colitis. GI was consulted and patient underwent a repeat CTA on 7/14 with extensive aortoiliac atherosclerotic disease. Case was discussed with Dr. Laurence Ferrari from IR who will provide percutaneous intervention via stenting. Given mechanical valve and intervention in few days, patient's coumadin was discontinued and will bridge with heparin just prior to intervention. Patient tolerated stent procedure well. Now resuming coumadin and bridging with heparin. Home when INR therapeutic  Subjective: - he is feeling well this morning, denies abdominal pain, nausea, vomiting and chest pain, no SOB. 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 CTA 03/02/15 with dynamic changes for distal small bowel consistent with ischemia.  -will follow GI rec's - IR to placed percutaneous stenting on 7/18 -will follow post procedure rec's -ok to start coumadin on 7/20 Acute blood loss anemia due to GI bleed due to ischemic colitis - patient has received a total of 4U pRBC , Hb went from 7.1 >> 9.6>> 9.9 - reports dark BM las night - continue ciprofloxacin and metronidazole; plan is for a total of 10 days treatment Elevated INR  - he was supratherapeutic on admission likely worsening his GI bleeding - on coumadin for [employer] AVR. This is on hold for IR. - heparin gtt resumed on 7/18 and clear to resumed coumadin on 7/20 -pharmacy to help with dose adjustments -goal is 25-3.5 Acute renal failure.  - Resolved.  -will monitor renal function CAD:  - no chest pain. Continue plavix and  fenofibrate. Chronic systolic heart failure - continue lasix -daily weight and follow up intake/output - he is euvolemic without LE edema or crackles DM2 (diabetes mellitus, type 2) - Holding amaryl while inpatient - Continue SSI.  -A1c 7.2 COPD (chronic obstructive pulmonary disease): stable, at baseline. Continue current regimen  IDA (iron deficiency anemia): see above. Continue iron supplement  Code Status: full Family Communication: d/w family bedside Disposition Plan: to [facility] for IR evaluation  Consultants:  GI  IR  Procedures:  none  Antibiotics:  Cipro 03/01/15>>plan is for a total of 10 days  Flagyl 03/01/15>>plan is for a total of 10 days  Objective: Filed Vitals:   03/07/15 1435  BP: 129/43  Pulse: 45  Temp: 98.2 F (36.8 C)  Resp: 18    Intake/Output Summary (Last 24 hours) at 03/07/15 1735 Last data filed at 03/07/15 1435  Gross per 24 hour  Intake 1367.9 ml  Output   1225 ml  Net  142.9 ml   Filed Weights   03/05/15 1324 03/06/15 0628 03/07/15 1128  Weight: 93.1 kg (205 lb 4 oz) 92.67 kg (204 lb 4.8 oz) 91.536 kg (201 lb 12.8 oz)   Exam:   General:  Appears comfortable, denies CP, no SOB. Tolerated procedure well. Hungry and would like to eat  HEENT: no scleral icterus, no nystagmus   Cardiovascular: RRR, no rubs or gallops; positive mechanic click from artificial valve   Respiratory: normal effort BS distant but clear  Abdomen: obese, soft, denies tenderness, positive BS  Musculoskeletal: no clubbing or cyanosis; left AKA. No right LE edema  Data Reviewed: Basic Metabolic Panel:  Recent Labs Lab 03/03/15  3383 03/04/15 0619 03/05/15 0607 03/06/15 0312 03/07/15 0416  NA 141 141 140 141 137  K 4.0 3.8 3.5 3.5 3.4*  CL 111 114* 110 109 106  CO2 '24 24 24 26 23  '$ GLUCOSE 136* 173* 145* 159* 144*  BUN 29* '17 14 12 16  '$ CREATININE 1.03 0.86 0.85 1.04 0.93  CALCIUM 8.4* 8.0* 8.5* 8.7* 8.4*   Liver Function Tests:  Recent  Labs Lab 03/01/15 1544 03/02/15 0609  AST 25 18  ALT 13* 12*  ALKPHOS 34* 28*  BILITOT 0.6 0.5  PROT 6.8 5.7*  ALBUMIN 3.1* 2.6*   CBC:  Recent Labs Lab 03/03/15 0609 03/04/15 0619 03/05/15 0607 03/06/15 0312 03/07/15 0416  WBC 9.9 9.9 7.7 7.0 7.5  HGB 7.4* 7.7* 9.6* 9.9* 9.4*  HCT 24.1* 24.9* 30.5* 32.0* 30.3*  MCV 74.8* 75.5* 76.8* 77.7* 78.9  PLT 278 293 293 302 289   BNP (last 3 results)  Recent Labs  12/28/14 0134 02/04/15 0413  BNP 504.3* 593.2*    ProBNP (last 3 results)  Recent Labs  05/10/14 0229 05/11/14 1530  PROBNP 820.3* 1302.0*    CBG:  Recent Labs Lab 03/06/15 1711 03/06/15 2133 03/07/15 0622 03/07/15 1254 03/07/15 1712  GLUCAP 126* 108* 147* 164* 173*    Studies: Ir Angiogram Visceral Selective  03/06/2015   CLINICAL DATA:  74 year old male with a history of signs and symptoms of chronic mesenteric ischemia, with abdominal pain and evidence of ischemic colitis on prior colonoscopy.  He has been referred for evaluation of angiogram and possible stenting of the superior mesenteric artery.  EXAM: ULTRASOUND GUIDED ACCESS OF RIGHT COMMON FEMORAL ARTERY.  AORTIC ANGIOGRAM  SUPERIOR MESENTERIC ARTERY ANGIOGRAM  COVERED STENTING OF SIGNIFICANT STENOSIS OF THE SUPERIOR MESENTERIC ARTERY ORIGIN, WITH REPEAT SUPERIOR MESENTERIC ARTERY ANGIOGRAM  ANESTHESIA/SEDATION: 3.0 Mg IV Versed; 175 mcg IV Fentanyl.  Total Moderate Sedation Time: 120 minutes  Additional Medications: 5000 units intraoperative heparin bolus  FLUOROSCOPY TIME:  26 minutes 30 seconds  PROCEDURE: The procedure, risks, benefits, and alternatives were explained to the patient and to the patient's family. Questions regarding the procedure were encouraged and answered. Specific risks discussed include: Bleeding, infection, renal artery injury, contrast reaction, possible dialysis, arterial injury/ dissection, closure of the superior mesenteric artery, need for further procedure or surgery,  disability, cardiopulmonary collapse, death. The patient understands and consents to the procedure.  The right groin was prepped with Betadine in a sterile fashion, and a sterile drape was applied covering the operative field. A sterile gown and sterile gloves were used for the procedure. Local anesthesia was provided with 1% Lidocaine.  Ultrasound survey of the right inguinal region was performed with images stored and sent to PACs.  A micropuncture needle was used access the right common femoral artery under ultrasound. With excellent arterial blood flow returned, and an 018 micro wire was passed through the needle, observed enter the abdominal aorta under fluoroscopy. The needle was removed, and a micropuncture sheath was placed over the wire. The inner dilator and wire were removed, and an 035 Bentson wire was advanced under fluoroscopy into the abdominal aorta. The sheath was removed and a standard 5 [place] vascular sheath was placed. The dilator was removed and the sheath was flushed.  Five French pigtail Flush catheter was advanced into the aorta and an aortogram was completed, including lateral image.  The Amplatz wire was then placed through the pigtail catheter and a 6 French flexor Raabe sheath was placed.  Cobra catheter  and Glidewire were used to select the superior mesenteric artery. Superior mesenteric artery angiogram was performed.  Once a Glidewire was across the stenosis, a 4 French glide cath was advanced into the distal superior mesenteric artery. A Rosen wire was then advanced into the artery. A combination of the Rosen wire, a 4 French glide cath, an 035 Amplatz wire, and the Cobra catheter were used to sequentially balloon angioplasty the stenotic origin of the superior mesenteric artery with a 4 mm diameter balloon and subsequently a 5 mm diameter balloon.  Once angioplasty had been performed, overlapping atrium balloon mounted covered stents were deployed across the disease of the proximal  superior mesenteric artery. Distally a 5 mm x 22 mm covered stent was deployed with post dilation. Approximately a 6 mm x 22 mm covered stent was deployed extending slightly beyond the ostium, and overlapping the more distal stent by just less than 1 cm.  The stent construct was post dilated to 6 mm, and the stent at the ostium was post dilated to 8 mm.  Final angiogram was performed.  Patient tolerated the procedure well and remained hemodynamically stable throughout.  No complications were encountered and no significant blood loss was encountered.  Manual pressure was used for hemostasis at the right common femoral artery.  COMPLICATIONS: None.  FINDINGS: Abdominal aorta g demonstrates atherosclerotic changes along the length of the aorta. No aneurysm or dissection flap is identified. Dense calcium present along the distal abdominal aorta.  Bilateral renal arteries are patent. There is approximately 50% stenosis at the origin of the left renal artery.  Temporarily, the celiac artery fills with the renal arteries, with delayed filling of the superior mesenteric artery, compatible with hemodynamically significant stenosis.  Distally, there is 2 directional flow at the inferior mesenteric artery, likely with collateral filling from the left call it artery and the superior rectal artery.  Lumbar arteries are patent.  Extensive atherosclerotic changes of the bilateral iliac system. Bilateral hypogastric arteries are approximately 50% stenotic at the origins though patent distally. There is a stent at the distal right external iliac artery within the pelvis, with in stent stenosis of approximately 50% narrowing. The sheath was not occlusive during the case. Right common femoral artery atherosclerotic changes are greater than the left common femoral artery.  Plaque at the proximal 3 cm of the superior mesenteric artery contributes to significant stenosis and decreased filling though the artery remains patent. There is  approximately 80 degree angulation beyond the first 4 cm of the superior mesenteric artery, of the location of significant calcification.  Status post balloon angioplasty and stenting of the proximal superior mesenteric artery there is resolution of the previous identified stenosis. Calcified plaque beyond the stents do not contribute to significant stenosis with improved filling. Further balloon angioplasty and stenting was not performed at this angulation of the artery to improve the angiographic outcome given the increased risk for dissection at this contour.  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  Signed,  Dulcy Fanny. Earleen Newport DO  [facility]  [facility]  PLAN: The patient  will have his right leg straight for 6 hours status post manual compression for hemostasis at the puncture site.  Hydration for 8 hours with normal saline.  Recommend continued Plavix for at least 3 months after the procedure for stent patency. 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   Electronically Signed   By: Corrie Mckusick D.O.   On: 03/06/2015 18:24   Ir US Guide Vasc Access Right  03/06/2015   CLINICAL DATA:  74 year old male with a history of signs and symptoms of chronic mesenteric ischemia, with abdominal pain and evidence of ischemic  colitis on prior colonoscopy.  He has been referred for evaluation of angiogram and possible stenting of the superior mesenteric artery.  EXAM: ULTRASOUND GUIDED ACCESS OF RIGHT COMMON FEMORAL ARTERY.  AORTIC ANGIOGRAM  SUPERIOR MESENTERIC ARTERY ANGIOGRAM  COVERED STENTING OF SIGNIFICANT STENOSIS OF THE SUPERIOR MESENTERIC ARTERY ORIGIN, WITH REPEAT SUPERIOR MESENTERIC ARTERY ANGIOGRAM  ANESTHESIA/SEDATION: 3.0 Mg IV Versed; 175 mcg IV Fentanyl.  Total Moderate Sedation Time: 120 minutes  Additional Medications: 5000 units intraoperative heparin bolus  FLUOROSCOPY TIME:  26 minutes 30 seconds  PROCEDURE: The procedure, risks, benefits, and alternatives were explained to the patient and to the patient's family. Questions regarding the procedure were encouraged and answered. Specific risks discussed include: Bleeding, infection, renal artery injury, contrast reaction, possible dialysis, arterial injury/ dissection, closure of the superior mesenteric artery, need for further procedure or surgery, disability, cardiopulmonary collapse, death. The patient understands and consents to the procedure.  The right groin was prepped with Betadine in a sterile fashion, and a sterile drape was applied covering the operative field. A sterile gown and sterile gloves were used for the procedure. Local anesthesia was provided with 1% Lidocaine.  Ultrasound survey of the right inguinal region was performed with images stored and sent to PACs.  A micropuncture needle was used access the right common femoral artery under ultrasound. With excellent arterial blood flow returned, and an 018 micro wire was passed through the needle, observed enter the abdominal aorta under fluoroscopy. The needle was removed, and a micropuncture sheath was placed over the wire. The inner dilator and wire were removed, and an 035 Bentson wire was advanced under fluoroscopy into the abdominal aorta. The sheath was removed and a standard 5 [place] vascular  sheath was placed. The dilator was removed and the sheath was flushed.  Five French pigtail Flush catheter was advanced into the aorta and an aortogram was completed, including lateral image.  The Amplatz wire was then placed through the pigtail catheter and a 6 French flexor Raabe sheath was placed.  Cobra catheter and Glidewire were used to select the superior mesenteric artery. Superior mesenteric artery angiogram was performed.  Once a Glidewire was across the stenosis, a 4 French glide cath was advanced into the distal superior mesenteric artery. A Rosen wire was then advanced into the artery. A combination of the Rosen wire, a 4 French glide cath, an 035 Amplatz wire, and the Cobra catheter were used to sequentially balloon angioplasty the stenotic origin of the superior mesenteric artery with a 4 mm diameter balloon and subsequently a 5 mm diameter balloon.  Once angioplasty had been performed, overlapping atrium balloon mounted covered stents were deployed across the disease of the proximal superior mesenteric artery. Distally a 5 mm x 22 mm covered stent was deployed with post dilation. Approximately a 6 mm x 22  mm covered stent was deployed extending slightly beyond the ostium, and overlapping the more distal stent by just less than 1 cm.  The stent construct was post dilated to 6 mm, and the stent at the ostium was post dilated to 8 mm.  Final angiogram was performed.  Patient tolerated the procedure well and remained hemodynamically stable throughout.  No complications were encountered and no significant blood loss was encountered.  Manual pressure was used for hemostasis at the right common femoral artery.  COMPLICATIONS: None.  FINDINGS: Abdominal aorta g demonstrates atherosclerotic changes along the length of the aorta. No aneurysm or dissection flap is identified. Dense calcium present along the distal abdominal aorta.  Bilateral renal arteries are patent. There is approximately 50% stenosis at the  origin of the left renal artery.  Temporarily, the celiac artery fills with the renal arteries, with delayed filling of the superior mesenteric artery, compatible with hemodynamically significant stenosis.  Distally, there is 2 directional flow at the inferior mesenteric artery, likely with collateral filling from the left call it artery and the superior rectal artery.  Lumbar arteries are patent.  Extensive atherosclerotic changes of the bilateral iliac system. Bilateral hypogastric arteries are approximately 50% stenotic at the origins though patent distally. There is a stent at the distal right external iliac artery within the pelvis, with in stent stenosis of approximately 50% narrowing. The sheath was not occlusive during the case. Right common femoral artery atherosclerotic changes are greater than the left common femoral artery.  Plaque at the proximal 3 cm of the superior mesenteric artery contributes to significant stenosis and decreased filling though the artery remains patent. There is approximately 80 degree angulation beyond the first 4 cm of the superior mesenteric artery, of the location of significant calcification.  Status post balloon angioplasty and stenting of the proximal superior mesenteric artery there is resolution of the previous identified stenosis. Calcified plaque beyond the stents do not contribute to significant stenosis with improved filling. Further balloon angioplasty and stenting was not performed at this angulation of the artery to improve the angiographic outcome given the increased risk for dissection at this contour.  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  Angiogram demonstrates extensive changes of atherosclerotic disease involving the aorta and mesenteric  vessels, including less than 50% stenosis of the proximal left renal artery, hemodynamically significant stenosis at the origin of the inferior mesenteric artery, and significant disease of the bilateral iliac systems.  Signed,  Dulcy Fanny. Earleen Newport, DO  [facility]  [facility]  PLAN: The patient will have his right leg straight for 6 hours status post manual compression for hemostasis at the puncture site.  Hydration for 8 hours with normal saline.  Recommend continued Plavix for at least 3 months after the procedure for stent patency. 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   Electronically Signed   By: Corrie Mckusick D.O.   On: 03/06/2015 18:24   Ir [employer] Stent 1st Art Not Therese Sarah Car Vert Car  03/06/2015   CLINICAL DATA:  74 year old male with a history of signs and symptoms of chronic mesenteric ischemia, with abdominal pain and evidence of ischemic colitis on prior colonoscopy.  He has been referred for evaluation of angiogram  and possible stenting of the superior mesenteric artery.  EXAM: ULTRASOUND GUIDED ACCESS OF RIGHT COMMON FEMORAL ARTERY.  AORTIC ANGIOGRAM  SUPERIOR MESENTERIC ARTERY ANGIOGRAM  COVERED STENTING OF SIGNIFICANT STENOSIS OF THE SUPERIOR MESENTERIC ARTERY ORIGIN, WITH REPEAT SUPERIOR MESENTERIC ARTERY ANGIOGRAM  ANESTHESIA/SEDATION: 3.0 Mg IV Versed; 175 mcg IV Fentanyl.  Total Moderate Sedation Time: 120 minutes  Additional Medications: 5000 units intraoperative heparin bolus  FLUOROSCOPY TIME:  26 minutes 30 seconds  PROCEDURE: The procedure, risks, benefits, and alternatives were explained to the patient and to the patient's family. Questions regarding the procedure were encouraged and answered. Specific risks discussed include: Bleeding, infection, renal artery injury, contrast reaction, possible dialysis, arterial  injury/ dissection, closure of the superior mesenteric artery, need for further procedure or surgery, disability, cardiopulmonary collapse, death. The patient understands and consents to the procedure.  The right groin was prepped with Betadine in a sterile fashion, and a sterile drape was applied covering the operative field. A sterile gown and sterile gloves were used for the procedure. Local anesthesia was provided with 1% Lidocaine.  Ultrasound survey of the right inguinal region was performed with images stored and sent to PACs.  A micropuncture needle was used access the right common femoral artery under ultrasound. With excellent arterial blood flow returned, and an 018 micro wire was passed through the needle, observed enter the abdominal aorta under fluoroscopy. The needle was removed, and a micropuncture sheath was placed over the wire. The inner dilator and wire were removed, and an 035 Bentson wire was advanced under fluoroscopy into the abdominal aorta. The sheath was removed and a standard 5 [place] vascular sheath was placed. The dilator was removed and the sheath was flushed.  Five French pigtail Flush catheter was advanced into the aorta and an aortogram was completed, including lateral image.  The Amplatz wire was then placed through the pigtail catheter and a 6 French flexor Raabe sheath was placed.  Cobra catheter and Glidewire were used to select the superior mesenteric artery. Superior mesenteric artery angiogram was performed.  Once a Glidewire was across the stenosis, a 4 French glide cath was advanced into the distal superior mesenteric artery. A Rosen wire was then advanced into the artery. A combination of the Rosen wire, a 4 French glide cath, an 035 Amplatz wire, and the Cobra catheter were used to sequentially balloon angioplasty the stenotic origin of the superior mesenteric artery with a 4 mm diameter balloon and subsequently a 5 mm diameter balloon.  Once angioplasty had been  performed, overlapping atrium balloon mounted covered stents were deployed across the disease of the proximal superior mesenteric artery. Distally a 5 mm x 22 mm covered stent was deployed with post dilation. Approximately a 6 mm x 22 mm covered stent was deployed extending slightly beyond the ostium, and overlapping the more distal stent by just less than 1 cm.  The stent construct was post dilated to 6 mm, and the stent at the ostium was post dilated to 8 mm.  Final angiogram was performed.  Patient tolerated the procedure well and remained hemodynamically stable throughout.  No complications were encountered and no significant blood loss was encountered.  Manual pressure was used for hemostasis at the right common femoral artery.  COMPLICATIONS: None.  FINDINGS: Abdominal aorta g demonstrates atherosclerotic changes along the length of the aorta. No aneurysm or dissection flap is identified. Dense calcium present along the distal abdominal aorta.  Bilateral renal arteries are patent. There is approximately 50% stenosis at  the origin of the left renal artery.  Temporarily, the celiac artery fills with the renal arteries, with delayed filling of the superior mesenteric artery, compatible with hemodynamically significant stenosis.  Distally, there is 2 directional flow at the inferior mesenteric artery, likely with collateral filling from the left call it artery and the superior rectal artery.  Lumbar arteries are patent.  Extensive atherosclerotic changes of the bilateral iliac system. Bilateral hypogastric arteries are approximately 50% stenotic at the origins though patent distally. There is a stent at the distal right external iliac artery within the pelvis, with in stent stenosis of approximately 50% narrowing. The sheath was not occlusive during the case. Right common femoral artery atherosclerotic changes are greater than the left common femoral artery.  Plaque at the proximal 3 cm of the superior mesenteric  artery contributes to significant stenosis and decreased filling though the artery remains patent. There is approximately 80 degree angulation beyond the first 4 cm of the superior mesenteric artery, of the location of significant calcification.  Status post balloon angioplasty and stenting of the proximal superior mesenteric artery there is resolution of the previous identified stenosis. Calcified plaque beyond the stents do not contribute to significant stenosis with improved filling. Further balloon angioplasty and stenting was not performed at this angulation of the artery to improve the angiographic outcome given the increased risk for dissection at this contour.  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  Signed,  Dulcy Fanny. Earleen Newport, DO  [facility]  [facility]  PLAN: The patient will have his right leg straight for 6 hours status post manual compression for hemostasis at the puncture site.  Hydration for 8 hours with normal saline.  Recommend continued Plavix for at least 3 months after the procedure for stent patency. 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   Electronically Signed   By: Corrie Mckusick D.O.   On: 03/06/2015 18:24    Scheduled Meds: . albuterol  4 mg Oral BID  . allopurinol  300 mg Oral QHS  .  cholecalciferol  2,000 Units Oral Daily  . ciprofloxacin  200 mg Intravenous Q12H  . clopidogrel  75 mg Oral Q breakfast  . fenofibrate  160 mg Oral Daily  . ferrous sulfate  325 mg Oral Q breakfast  . furosemide  40 mg Oral Daily  . insulin aspart  0-5 Units Subcutaneous QHS  . insulin aspart  0-9 Units Subcutaneous TID WC  . ipratropium-albuterol  3 mL Inhalation BID  . isosorbide mononitrate  30 mg Oral Daily  . linagliptin  5 mg Oral Daily  . metoprolol succinate  50 mg Oral Daily  . metronidazole  500 mg Intravenous Q8H  . omega-3 acid ethyl esters  1 g Oral BID  . potassium chloride SA  20 mEq Oral Daily  . pregabalin  50 mg Oral BID  . rosuvastatin  20 mg Oral q1800  . sodium chloride  3 mL Intravenous Q12H  . sucralfate  1  g Oral BID  . vitamin C with rose hips  1,000 mg Oral Daily   Continuous Infusions: . heparin 2,000 Units/hr (03/07/15 1252)    Principal Problem:   Abdominal pain Active Problems:   H/O aortic valve replacement- St Jude   DM2 (diabetes mellitus, type 2)   COPD (chronic obstructive pulmonary disease)   HTN (hypertension)   S/P below knee amputation   Enteritis   Chronic mesenteric ischemia   IDA (iron deficiency anemia)   Chronic anticoagulation   Acute blood loss anemia   Elevated INR   Mesenteric ischemia, chronic   Chronic systolic heart failure   SMA stenosis   Time: 30 minutes  Barton Dubois  [facility] Pager [phone]. If 7PM-7AM, please contact night-coverage at [email or web address], password [facility] 03/07/2015, 5:35 PM  LOS: 6 days

## 2015-03-07 NOTE — Progress Notes (Signed)
Noted consult for pharmacy to dose warfarin starting 7/20.  Since no dose is due tonight, pharmacy will follow up in the morning and enter orders. Daily INR has already been ordered and will be continued.    Tanner Pena, PharmD, BCPS Clinical Pharmacist Pager: [phone] 03/07/2015 5:49 PM

## 2015-03-07 NOTE — Progress Notes (Signed)
ANTICOAGULATION CONSULT NOTE - Follow Up Consult  Pharmacy Consult for heparin Indication: AVR   Labs:  Recent Labs  03/05/15 0607  03/06/15 0312 03/07/15 0416 03/07/15 1134  HGB 9.6*  --  9.9* 9.4*  --   HCT 30.5*  --  32.0* 30.3*  --   PLT 293  --  302 289  --   LABPROT 21.9*  --  20.2* 18.3*  --   INR 1.92*  --  1.73* 1.51*  --   HEPARINUNFRC 0.21*  < > 0.42 0.18* 0.40  CREATININE 0.85  --  1.04 0.93  --   < > = values in this interval not displayed.    Assessment: 74yo male therapeutic on heparin after resumed s/p stent placement for mesenteric ischemia.    Goal of Therapy:  Heparin level 0.3-0.7 units/ml   Plan:  Cont heparin 2000 units/hr. F/u am labs F/u resume coumadin  Thanks for allowing pharmacy to be a part of this patient's care.  Excell Seltzer, PharmD Clinical Pharmacist, [phone] 03/07/2015,1:54 PM

## 2015-03-07 NOTE — Progress Notes (Signed)
ANTICOAGULATION CONSULT NOTE - Follow Up Consult  Pharmacy Consult for heparin Indication: AVR   Labs:  Recent Labs  03/04/15 0619  03/05/15 0607 03/05/15 1531 03/06/15 0312 03/07/15 0416  HGB 7.7*  --  9.6*  --  9.9* 9.4*  HCT 24.9*  --  30.5*  --  32.0* 30.3*  PLT 293  --  293  --  302 289  LABPROT 26.8*  --  21.9*  --  20.2* 18.3*  INR 2.51*  --  1.92*  --  1.73* 1.51*  HEPARINUNFRC  --   < > 0.21* 0.32 0.42 0.18*  CREATININE 0.86  --  0.85  --  1.04  --   < > = values in this interval not displayed.    Assessment: 74yo male subtherapeutic on heparin after resumed s/p stent pklacement for mesenteric ischemia, gtt has been running fine x10hr since resumed.  Goal of Therapy:  Heparin level 0.3-0.7 units/ml   Plan:  Will increase heparin gtt by 2 units/kg/hr to 2000 units/hr and check level in [place], PharmD, BCPS  03/07/2015,5:28 AM

## 2015-03-08 DIAGNOSIS — R1031 Right lower quadrant pain: Secondary | ICD-10-CM

## 2015-03-08 LAB — PROTIME-INR
INR: 1.64 — ABNORMAL HIGH (ref 0.00–1.49)
PROTHROMBIN TIME: 19.5 s — AB (ref 11.6–15.2)

## 2015-03-08 LAB — GLUCOSE, CAPILLARY
Glucose-Capillary: 177 mg/dL — ABNORMAL HIGH (ref 65–99)
Glucose-Capillary: 224 mg/dL — ABNORMAL HIGH (ref 65–99)
Glucose-Capillary: 182 mg/dL — ABNORMAL HIGH (ref 65–99)
Glucose-Capillary: 153 mg/dL — ABNORMAL HIGH (ref 65–99)

## 2015-03-08 LAB — HEPARIN LEVEL (UNFRACTIONATED): Heparin Unfractionated: 0.51 IU/mL (ref 0.30–0.70)

## 2015-03-08 MED ORDER — WARFARIN - PHARMACIST DOSING INPATIENT
Freq: Every day | Status: DC
  Administered 2015-03-08: 17:00:00

## 2015-03-08 MED ORDER — WARFARIN SODIUM 7.5 MG PO TABS
7.5000 mg | ORAL_TABLET | Freq: Once | ORAL | Status: AC
  Administered 2015-03-08: 7.5 mg via ORAL
  Filled 2015-03-08: qty 1

## 2015-03-08 NOTE — Consult Note (Signed)
   [facility] CM Inpatient Consult   03/08/2015  Tanner Pena 12/21/1940 [id number] Referral received to assess for care management services. Met with the patient regarding the benefits of [facility] Care Management services. Explained that [facility] Care Management is a covered benefit of [employer] insurance and [facility] provider.  Patient has a history of COPD, diabetes, and HF with aortic valve.   Review information for [facility] Care Management for monitoring post hospital follow up.   Explained that [facility] Care Management does not interfere with or replace any services arranged by the inpatient care management staff.  Patient wanted brochure to share with his daughter before he sign a consent.  Patient endorses that his primary care provider is Dr. Lawrence Fusco. He states that currently his nephew is going to stay with him until he gets back to his routine.  He states that his daughter helps him with a pill box refilling them on a weekly basis.  He states he is somewhat interested in some nursing monitoring as described but wanted to talk to his daughter first.  Brochure with contact information was provided. Will attempt a follow up prior to discharge.  Will update inpatient RNCM of referral visit and outcome.  For questions, please contact: Victoria Brewer, RN BSN CCM [facility] Liaison  [phone] business mobile phone    

## 2015-03-08 NOTE — Progress Notes (Signed)
[facility] PROGRESS NOTE  Tanner Pena VQM:[id number] DOB: 05-Sep-1940 DOA: 03/01/2015 PCP: [name], MD  HPI 74 y.o. Male admitted on 7/13 with severe abdominal pain in the setting of known advanced atherosclerotic disease and likely representing ischemic colitis. GI was consulted and patient underwent a repeat CTA on 7/14 with extensive aortoiliac atherosclerotic disease. Case was discussed with Dr. Laurence Ferrari from IR who will provide percutaneous intervention via stenting. Given mechanical valve and intervention in few days, patient's coumadin was discontinued and will bridge with heparin just prior to intervention. Patient will be transferred to [facility] on Sunday for IR evaluation Monday morning.   Subjective: -Patient is asymptomatic, reports tolerating by mouth intake and improvement to abdominal pain after undergoing stenting by interventional radiology.  Assessment/Plan: Abdominal pain  - patient with known arterial occlusive disease per CTA 2012, to result in mesenteric ischemia and ischemic ulcers of the colon. CTA 03/02/15 with dynamic changes for distal small bowel consistent with ischemia.  -Patient undergoing transcatheter placement of stent on 03/08/2015, procedure performed by interventional radiology. -Patient will need to stay on aspirin and Plavix for at least 3 months Acute blood loss anemia due to GI bleed due to ischemic colitis - patient has received a total of 4U pRBC , Hb went from 7.1 >> 9.6>> 9.9 - reports dark BM las night - IR looking to proceed with stent placement later this afternoon  - He received 7 day course of ciprofloxacin and metronidazole Elevated INR  - he was supratherapeutic on admission likely worsening his GI bleeding - on coumadin for St Jude AVR.  - His Coumadin was restarted, a.m. labs showing INR of 1.64, remains on IV heparin Acute renal failure.  - Resolved.  -will monitor renal function CAD:  - no chest pain.  Continue plavix and fenofibrate. Chronic systolic heart failure - continue lasix -daily weight and follow up intake/output - he is euvolemic without LE edema or crackles DM2 (diabetes mellitus, type 2) - Holding amaryl while inpatient - Continue SSI.  -A1c 7.2 COPD (chronic obstructive pulmonary disease): stable, at baseline. Continue current regimen  IDA (iron deficiency anemia): see above. Continue iron supplement  Code Status: full Family Communication: d/w family bedside Disposition Plan: Anticipate discharge home when medically stable  Consultants:  GI  IR  Procedures:  none  Antibiotics:  Cipro 03/01/15>>  Flagyl 03/01/15>>  Objective: Filed Vitals:   03/08/15 1646  BP: 98/66  Pulse: 66  Temp: 98 F (36.7 C)  Resp: 18    Intake/Output Summary (Last 24 hours) at 03/08/15 1734 Last data filed at 03/08/15 1520  Gross per 24 hour  Intake   1600 ml  Output    805 ml  Net    795 ml   Filed Weights   03/06/15 0628 03/07/15 1128 03/08/15 0458  Weight: 92.67 kg (204 lb 4.8 oz) 91.536 kg (201 lb 12.8 oz) 88.542 kg (195 lb 3.2 oz)   Exam:   General:  Appears comfortable, denies CP, no SOB  HEENT: no scleral icterus, no nystagmus   Cardiovascular: RRR, no rubs or gallops; positive mechanic click from artificial valve   Respiratory: normal effort BS distant but clear  Abdomen: obese, soft, denies tenderness, positive BS  Musculoskeletal: no clubbing or cyanosis; left AKA. No right LE edema  Data Reviewed: Basic Metabolic Panel:  Recent Labs Lab 03/03/15 0609 03/04/15 0619 03/05/15 0607 03/06/15 0312 03/07/15 0416  NA 141 141 140 141 137  K 4.0 3.8 3.5 3.5  3.4*  CL 111 114* 110 109 106  CO2 '24 24 24 26 23  '$ GLUCOSE 136* 173* 145* 159* 144*  BUN 29* '17 14 12 16  '$ CREATININE 1.03 0.86 0.85 1.04 0.93  CALCIUM 8.4* 8.0* 8.5* 8.7* 8.4*   Liver Function Tests:  Recent Labs Lab 03/02/15 0609  AST 18  ALT 12*  ALKPHOS 28*  BILITOT 0.5  PROT  5.7*  ALBUMIN 2.6*   CBC:  Recent Labs Lab 03/03/15 0609 03/04/15 0619 03/05/15 0607 03/06/15 0312 03/07/15 0416  WBC 9.9 9.9 7.7 7.0 7.5  HGB 7.4* 7.7* 9.6* 9.9* 9.4*  HCT 24.1* 24.9* 30.5* 32.0* 30.3*  MCV 74.8* 75.5* 76.8* 77.7* 78.9  PLT 278 293 293 302 289   BNP (last 3 results)  Recent Labs  12/28/14 0134 02/04/15 0413  BNP 504.3* 593.2*    ProBNP (last 3 results)  Recent Labs  05/10/14 0229 05/11/14 1530  PROBNP 820.3* 1302.0*    CBG:  Recent Labs Lab 03/07/15 1712 03/07/15 2102 03/08/15 0629 03/08/15 1151 03/08/15 1610  GLUCAP 173* 150* 177* 224* 182*    Studies: No results found.  Scheduled Meds: . albuterol  4 mg Oral BID  . allopurinol  300 mg Oral QHS  . cholecalciferol  2,000 Units Oral Daily  . ciprofloxacin  200 mg Intravenous Q12H  . clopidogrel  75 mg Oral Q breakfast  . fenofibrate  160 mg Oral Daily  . ferrous sulfate  325 mg Oral Q breakfast  . furosemide  40 mg Oral Daily  . insulin aspart  0-5 Units Subcutaneous QHS  . insulin aspart  0-9 Units Subcutaneous TID WC  . ipratropium-albuterol  3 mL Inhalation BID  . isosorbide mononitrate  30 mg Oral Daily  . linagliptin  5 mg Oral Daily  . metoprolol succinate  50 mg Oral Daily  . metronidazole  500 mg Intravenous Q8H  . omega-3 acid ethyl esters  1 g Oral BID  . potassium chloride SA  20 mEq Oral Daily  . pregabalin  50 mg Oral BID  . rosuvastatin  20 mg Oral q1800  . sodium chloride  3 mL Intravenous Q12H  . sucralfate  1 g Oral BID  . vitamin C with rose hips  1,000 mg Oral Daily  . Warfarin - Pharmacist Dosing Inpatient   Does not apply q1800   Continuous Infusions: . heparin 2,000 Units/hr (03/08/15 1400)    Principal Problem:   Abdominal pain Active Problems:   H/O aortic valve replacement- St Jude   DM2 (diabetes mellitus, type 2)   COPD (chronic obstructive pulmonary disease)   HTN (hypertension)   S/P below knee amputation   Enteritis   Chronic  mesenteric ischemia   IDA (iron deficiency anemia)   Chronic anticoagulation   Acute blood loss anemia   Elevated INR   Mesenteric ischemia, chronic   Chronic systolic heart failure   SMA stenosis   Time: 25 minutes  Marlowe Cinquemani  [facility] Pager [phone]. If 7PM-7AM, please contact night-coverage at [email or web address], password [facility] 03/08/2015, 5:34 PM  LOS: 7 days

## 2015-03-08 NOTE — Progress Notes (Signed)
ANTICOAGULATION CONSULT NOTE - Initial Consult  Pharmacy Consult for Warfarin Indication: for St Jude AVR  No Known Allergies  Patient Measurements: Height: 5' 10.5" (179.1 cm) Weight: 195 lb 3.2 oz (88.542 kg) IBW/kg (Calculated) : 74.15   Vital Signs: Temp: 98.7 F (37.1 C) (07/20 0802) Temp Source: Oral (07/20 0802) BP: 106/51 mmHg (07/20 0802) Pulse Rate: 64 (07/20 0802)  Labs:  Recent Labs  03/06/15 0312 03/07/15 0416 03/07/15 1134 03/08/15 0254  HGB 9.9* 9.4*  --   --   HCT 32.0* 30.3*  --   --   PLT 302 289  --   --   LABPROT 20.2* 18.3*  --  19.5*  INR 1.73* 1.51*  --  1.64*  HEPARINUNFRC 0.42 0.18* 0.40 0.51  CREATININE 1.04 0.93  --   --     Estimated Creatinine Clearance: 73.1 mL/min (by C-G formula based on Cr of 0.93).   Medical History: Past Medical History  Diagnosis Date  . Diabetes mellitus 2007  . Hypertension   . Gout   . Hypercholesteremia   . Peripheral vascular disease     stents lower extremity  . COPD (chronic obstructive pulmonary disease)   . S/P aortic valve replacement 1990    a. St. Jude  . Chronic back pain   . Dysphagia   . Neuromuscular disorder   . Peripheral neuropathy   . Critical lower limb ischemia   . CAD (coronary artery disease)     a. 05/13/14 [name] s/p overlapping DESx2 to SVG to RCA. b.  s/p CABG '90 with redo '94 & stent to RCA SVG in 2005  . Chronic toe ulcer     a. left foot  . Shortness of breath   . Myocardial infarction   . CHF (congestive heart failure)   . Stone in kidney   . GERD (gastroesophageal reflux disease)   . Arthritis   . Sleep apnea     tested greater than 7 years ago per patient    Medications:  Scheduled:  . albuterol  4 mg Oral BID  . allopurinol  300 mg Oral QHS  . cholecalciferol  2,000 Units Oral Daily  . ciprofloxacin  200 mg Intravenous Q12H  . clopidogrel  75 mg Oral Q breakfast  . fenofibrate  160 mg Oral Daily  . ferrous sulfate  325 mg Oral Q breakfast  . furosemide   40 mg Oral Daily  . insulin aspart  0-5 Units Subcutaneous QHS  . insulin aspart  0-9 Units Subcutaneous TID WC  . ipratropium-albuterol  3 mL Inhalation BID  . isosorbide mononitrate  30 mg Oral Daily  . linagliptin  5 mg Oral Daily  . metoprolol succinate  50 mg Oral Daily  . metronidazole  500 mg Intravenous Q8H  . omega-3 acid ethyl esters  1 g Oral BID  . potassium chloride SA  20 mEq Oral Daily  . pregabalin  50 mg Oral BID  . rosuvastatin  20 mg Oral q1800  . sodium chloride  3 mL Intravenous Q12H  . sucralfate  1 g Oral BID  . vitamin C with rose hips  1,000 mg Oral Daily    Assessment: 74 yo male admitted with severe abdominal pain, likely representing ischemic colitis.  Underwent CTA on 7/14 and subsequently percutaneous intervention via stenting per IR.   Home coumadin was held and IV heparin started given mechanical valve.  Patient tolerated stent and pharmacy has been consulted to resume coumadin. Goal of Therapy:  INR 2.5-3.5 Monitor platelets by anticoagulation protocol: Yes   Plan:  -Coumadin 7.5 mg x 1 dose tonight -Continue Heparin bridge until INR therapeutic -Daily PT/INR - Monitor for s/sx bleeding  Reatha Harps, Pharm.D., BCPS 03/08/2015,9:55 AM

## 2015-03-08 NOTE — Progress Notes (Signed)
CM following for DCP  Note from previous CM Pt is from home with self care. Pt independent with ADL's. Pt has L LE amputation was is scheduled for OP therapy to begin working with his prothesis next Tuesday in the OP rehab facility in [place]. Pt says he will call and reschedule appointment if he is still in the hospital on Monday. Pt has a cane, walker, wheelchair, crutches and BSC at home. Pt plans to return home with self care at DC. No CM needs anticipated, will cont to follow for discharge planning.  Sherald Barge, RN 03/02/2015, 1:28 PM

## 2015-03-09 ENCOUNTER — Ambulatory Visit (HOSPITAL_COMMUNITY): Admission: RE | Admit: 2015-03-09 | Payer: Medicare Other | Source: Ambulatory Visit | Admitting: Internal Medicine

## 2015-03-09 ENCOUNTER — Encounter (HOSPITAL_COMMUNITY): Admission: RE | Payer: Self-pay | Source: Ambulatory Visit

## 2015-03-09 DIAGNOSIS — Z954 Presence of other heart-valve replacement: Secondary | ICD-10-CM

## 2015-03-09 LAB — BASIC METABOLIC PANEL
Anion gap: 4 — ABNORMAL LOW (ref 5–15)
BUN: 17 mg/dL (ref 6–20)
CALCIUM: 8.6 mg/dL — AB (ref 8.9–10.3)
CHLORIDE: 113 mmol/L — AB (ref 101–111)
CO2: 24 mmol/L (ref 22–32)
Creatinine, Ser: 0.94 mg/dL (ref 0.61–1.24)
Glucose, Bld: 170 mg/dL — ABNORMAL HIGH (ref 65–99)
POTASSIUM: 3.5 mmol/L (ref 3.5–5.1)
Sodium: 141 mmol/L (ref 135–145)

## 2015-03-09 LAB — PROTIME-INR
INR: 1.57 — ABNORMAL HIGH (ref 0.00–1.49)
Prothrombin Time: 18.8 seconds — ABNORMAL HIGH (ref 11.6–15.2)

## 2015-03-09 LAB — GLUCOSE, CAPILLARY
GLUCOSE-CAPILLARY: 219 mg/dL — AB (ref 65–99)
GLUCOSE-CAPILLARY: 142 mg/dL — AB (ref 65–99)
Glucose-Capillary: 149 mg/dL — ABNORMAL HIGH (ref 65–99)
Glucose-Capillary: 186 mg/dL — ABNORMAL HIGH (ref 65–99)

## 2015-03-09 LAB — CBC
HEMATOCRIT: 25.7 % — AB (ref 39.0–52.0)
Hemoglobin: 7.8 g/dL — ABNORMAL LOW (ref 13.0–17.0)
MCH: 24.1 pg — ABNORMAL LOW (ref 26.0–34.0)
MCHC: 30.4 g/dL (ref 30.0–36.0)
MCV: 79.3 fL (ref 78.0–100.0)
Platelets: 254 10*3/uL (ref 150–400)
RBC: 3.24 MIL/uL — ABNORMAL LOW (ref 4.22–5.81)
RDW: 23.2 % — ABNORMAL HIGH (ref 11.5–15.5)
WBC: 7.5 10*3/uL (ref 4.0–10.5)

## 2015-03-09 LAB — HEPARIN LEVEL (UNFRACTIONATED): Heparin Unfractionated: 0.53 IU/mL (ref 0.30–0.70)

## 2015-03-09 LAB — CLOSTRIDIUM DIFFICILE BY PCR: Toxigenic C. Difficile by PCR: NEGATIVE

## 2015-03-09 LAB — OCCULT BLOOD X 1 CARD TO LAB, STOOL: Fecal Occult Bld: POSITIVE — AB

## 2015-03-09 SURGERY — COLONOSCOPY WITH PROPOFOL
Anesthesia: Monitor Anesthesia Care

## 2015-03-09 MED ORDER — PANTOPRAZOLE SODIUM 40 MG IV SOLR
40.0000 mg | INTRAVENOUS | Status: DC
  Administered 2015-03-09: 40 mg via INTRAVENOUS
  Filled 2015-03-09 (×3): qty 40

## 2015-03-09 MED ORDER — WARFARIN SODIUM 7.5 MG PO TABS
7.5000 mg | ORAL_TABLET | Freq: Once | ORAL | Status: AC
  Administered 2015-03-09: 7.5 mg via ORAL
  Filled 2015-03-09 (×2): qty 1

## 2015-03-09 NOTE — Progress Notes (Signed)
UR completed 

## 2015-03-09 NOTE — Progress Notes (Signed)
ANTICOAGULATION CONSULT NOTE - Follow Up Consult  Pharmacy Consult for Warfarin Indication: St Jude AVR  No Known Allergies  Patient Measurements: Height: 5' 10.5" (179.1 cm) Weight: 201 lb 4.8 oz (91.309 kg) (b scale) IBW/kg (Calculated) : 74.15   Vital Signs: Temp: 98.5 F (36.9 C) (07/21 0644) Temp Source: Oral (07/21 0644) BP: 102/60 mmHg (07/21 0644) Pulse Rate: 62 (07/21 0644)  Labs:  Recent Labs  03/07/15 0416 03/07/15 1134 03/08/15 0254 03/09/15 0345  HGB 9.4*  --   --  7.8*  HCT 30.3*  --   --  25.7*  PLT 289  --   --  254  LABPROT 18.3*  --  19.5* 18.8*  INR 1.51*  --  1.64* 1.57*  HEPARINUNFRC 0.18* 0.40 0.51 0.53  CREATININE 0.93  --   --  0.94    Estimated Creatinine Clearance: 79 mL/min (by C-G formula based on Cr of 0.94).   Medications:  Scheduled:  . albuterol  4 mg Oral BID  . allopurinol  300 mg Oral QHS  . cholecalciferol  2,000 Units Oral Daily  . clopidogrel  75 mg Oral Q breakfast  . fenofibrate  160 mg Oral Daily  . ferrous sulfate  325 mg Oral Q breakfast  . furosemide  40 mg Oral Daily  . insulin aspart  0-5 Units Subcutaneous QHS  . insulin aspart  0-9 Units Subcutaneous TID WC  . ipratropium-albuterol  3 mL Inhalation BID  . isosorbide mononitrate  30 mg Oral Daily  . linagliptin  5 mg Oral Daily  . metoprolol succinate  50 mg Oral Daily  . omega-3 acid ethyl esters  1 g Oral BID  . potassium chloride SA  20 mEq Oral Daily  . pregabalin  50 mg Oral BID  . rosuvastatin  20 mg Oral q1800  . sodium chloride  3 mL Intravenous Q12H  . sucralfate  1 g Oral BID  . vitamin C with rose hips  1,000 mg Oral Daily  . Warfarin - Pharmacist Dosing Inpatient   Does not apply q1800    Assessment: 74 yo male admitted with severe abdominal pain, likely representing ischemic colitis. Underwent CTA on 7/14 and subsequently percutaneous intervention via stenting per IR. Home coumadin was held and IV heparin started given mechanical  valve  Current heparin level 0.53 within therapeutic range. Current INR 1.57, however  Hgb trending down 9.9<9.4<7.8.  Heme occult ordered.   Goal of Therapy:  INR 2.5-3.5 Heparin level 0.3-0.7 Monitor platelets by anticoagulation protocol: Yes   Plan:  Continue Heparin IV @ 2000 units/h Warfarin 7.5 mg x 1 dose tonight Daily PT/INR Daily HL/CBC Mon s/sx bleeding   Toneka Fullen P Greenley Martone 03/09/2015,10:12 AM

## 2015-03-09 NOTE — Progress Notes (Signed)
[facility] PROGRESS NOTE  Tanner Pena VWU:[id number] DOB: 1941-07-03 DOA: 03/01/2015 PCP: [name], MD  HPI 74 y.o. Male admitted on 7/13 with severe abdominal pain in the setting of known advanced atherosclerotic disease and likely representing ischemic colitis. GI was consulted and patient underwent a repeat CTA on 7/14 with extensive aortoiliac atherosclerotic disease. Case was discussed with Dr. Laurence Ferrari from IR who will provide percutaneous intervention via stenting. Given mechanical valve and intervention in few days, patient's coumadin was discontinued and will bridge with heparin just prior to intervention. Patient will be transferred to [facility] on Sunday for IR evaluation Monday morning.   Subjective: -Patient states overall doing well, he has no complaints. He denies bloody stools or hematemesis. Tolerating by mouth intake denies further episodes of abdominal pain  Assessment/Plan: Abdominal pain  - patient with known arterial occlusive disease per CTA 2012, to result in mesenteric ischemia and ischemic ulcers of the colon. CTA 03/02/15 with dynamic changes for distal small bowel consistent with ischemia.  -Patient undergoing transcatheter placement of stent on 03/08/2015, procedure performed by interventional radiology. -Patient will need to stay on aspirin and Plavix for at least 3 months  Acute blood loss anemia due to GI bleed due to ischemic colitis - patient has received a total of 4U pRBC , Hb went from 7.1 >> 9.6>> 9.9 -On 03/09/2015 a.m. labs showing hemoglobin of 7.8, down from 9.4 -Awaiting stool for Hemoccult -Will start Protonix 40 mg IV daily -He denies bloody stools  Anticoagulation  - he was supratherapeutic on admission likely worsening his GI bleeding - on coumadin for St Jude AVR.  -He remains subtherapeutic with INR 1.5, pharmacy consulted for Coumadin dosing. He is being covered with IV heparin.  Acute renal failure.  -  Resolved.   CAD:  - no chest pain. Continue plavix and fenofibrate.  Chronic systolic heart failure - continue lasix -daily weight and follow up intake/output - he is euvolemic without LE edema or crackles  DM2 (diabetes mellitus, type 2) - Holding amaryl while inpatient - Continue SSI.  -A1c 7.2   Code Status: full Family Communication: d/w family bedside Disposition Plan: Anticipate discharge home when medically stable  Consultants:  GI  IR  Procedures:  none  Antibiotics:  Cipro 03/01/15>>  Flagyl 03/01/15>>  Objective: Filed Vitals:   03/09/15 1407  BP: 107/48  Pulse: 67  Temp: 98.3 F (36.8 C)  Resp: 18    Intake/Output Summary (Last 24 hours) at 03/09/15 1651 Last data filed at 03/09/15 1550  Gross per 24 hour  Intake   1540 ml  Output   1576 ml  Net    -36 ml   Filed Weights   03/07/15 1128 03/08/15 0458 03/09/15 0644  Weight: 91.536 kg (201 lb 12.8 oz) 88.542 kg (195 lb 3.2 oz) 91.309 kg (201 lb 4.8 oz)   Exam:   General:  Appears comfortable, denies CP, no SOB  HEENT: no scleral icterus, no nystagmus   Cardiovascular: RRR, no rubs or gallops; positive mechanic click from artificial valve   Respiratory: normal effort BS distant but clear  Abdomen: obese, soft, denies tenderness, positive BS  Musculoskeletal: no clubbing or cyanosis; left AKA. No right LE edema  Data Reviewed: Basic Metabolic Panel:  Recent Labs Lab 03/04/15 0619 03/05/15 0607 03/06/15 0312 03/07/15 0416 03/09/15 0345  NA 141 140 141 137 141  K 3.8 3.5 3.5 3.4* 3.5  CL 114* 110 109 106 113*  CO2 24 24 26  23 24  GLUCOSE 173* 145* 159* 144* 170*  BUN '17 14 12 16 17  '$ CREATININE 0.86 0.85 1.04 0.93 0.94  CALCIUM 8.0* 8.5* 8.7* 8.4* 8.6*   Liver Function Tests: No results for input(s): AST, ALT, ALKPHOS, BILITOT, PROT, ALBUMIN in the last 168 hours. CBC:  Recent Labs Lab 03/04/15 0619 03/05/15 0607 03/06/15 0312 03/07/15 0416 03/09/15 0345  WBC 9.9  7.7 7.0 7.5 7.5  HGB 7.7* 9.6* 9.9* 9.4* 7.8*  HCT 24.9* 30.5* 32.0* 30.3* 25.7*  MCV 75.5* 76.8* 77.7* 78.9 79.3  PLT 293 293 302 289 254   BNP (last 3 results)  Recent Labs  12/28/14 0134 02/04/15 0413  BNP 504.3* 593.2*    ProBNP (last 3 results)  Recent Labs  05/10/14 0229 05/11/14 1530  PROBNP 820.3* 1302.0*    CBG:  Recent Labs Lab 03/08/15 1610 03/08/15 2157 03/09/15 0635 03/09/15 1128 03/09/15 1615  GLUCAP 182* 153* 149* 219* 186*    Studies: No results found.  Scheduled Meds: . albuterol  4 mg Oral BID  . allopurinol  300 mg Oral QHS  . cholecalciferol  2,000 Units Oral Daily  . clopidogrel  75 mg Oral Q breakfast  . fenofibrate  160 mg Oral Daily  . ferrous sulfate  325 mg Oral Q breakfast  . furosemide  40 mg Oral Daily  . insulin aspart  0-5 Units Subcutaneous QHS  . insulin aspart  0-9 Units Subcutaneous TID WC  . ipratropium-albuterol  3 mL Inhalation BID  . isosorbide mononitrate  30 mg Oral Daily  . linagliptin  5 mg Oral Daily  . metoprolol succinate  50 mg Oral Daily  . omega-3 acid ethyl esters  1 g Oral BID  . potassium chloride SA  20 mEq Oral Daily  . pregabalin  50 mg Oral BID  . rosuvastatin  20 mg Oral q1800  . sodium chloride  3 mL Intravenous Q12H  . sucralfate  1 g Oral BID  . vitamin C with rose hips  1,000 mg Oral Daily  . warfarin  7.5 mg Oral ONCE-1800  . Warfarin - Pharmacist Dosing Inpatient   Does not apply q1800   Continuous Infusions: . heparin 2,000 Units/hr (03/09/15 1615)    Principal Problem:   Abdominal pain Active Problems:   H/O aortic valve replacement- St Jude   DM2 (diabetes mellitus, type 2)   COPD (chronic obstructive pulmonary disease)   HTN (hypertension)   S/P below knee amputation   Enteritis   Chronic mesenteric ischemia   IDA (iron deficiency anemia)   Chronic anticoagulation   Acute blood loss anemia   Elevated INR   Mesenteric ischemia, chronic   Chronic systolic heart failure    SMA stenosis   Time: 30 minutes  Cielle Aguila  [facility] Pager [phone]. If 7PM-7AM, please contact night-coverage at [email or web address], password [facility] 03/09/2015, 4:51 PM  LOS: 8 days

## 2015-03-10 ENCOUNTER — Ambulatory Visit: Payer: Medicare Other | Admitting: Gastroenterology

## 2015-03-10 DIAGNOSIS — Z89512 Acquired absence of left leg below knee: Secondary | ICD-10-CM

## 2015-03-10 DIAGNOSIS — I771 Stricture of artery: Secondary | ICD-10-CM

## 2015-03-10 DIAGNOSIS — I5022 Chronic systolic (congestive) heart failure: Secondary | ICD-10-CM

## 2015-03-10 DIAGNOSIS — E1159 Type 2 diabetes mellitus with other circulatory complications: Secondary | ICD-10-CM

## 2015-03-10 LAB — GLUCOSE, CAPILLARY
GLUCOSE-CAPILLARY: 192 mg/dL — AB (ref 65–99)
Glucose-Capillary: 171 mg/dL — ABNORMAL HIGH (ref 65–99)
Glucose-Capillary: 214 mg/dL — ABNORMAL HIGH (ref 65–99)
Glucose-Capillary: 173 mg/dL — ABNORMAL HIGH (ref 65–99)

## 2015-03-10 LAB — BASIC METABOLIC PANEL
Anion gap: 8 (ref 5–15)
BUN: 11 mg/dL (ref 6–20)
CO2: 26 mmol/L (ref 22–32)
Calcium: 8.6 mg/dL — ABNORMAL LOW (ref 8.9–10.3)
Chloride: 106 mmol/L (ref 101–111)
Creatinine, Ser: 0.92 mg/dL (ref 0.61–1.24)
GFR calc Af Amer: >60 mL/min (ref >60)
GFR calc non Af Amer: >60 mL/min (ref >60)
Glucose, Bld: 176 mg/dL — ABNORMAL HIGH (ref 65–99)
Potassium: 3.5 mmol/L (ref 3.5–5.1)
SODIUM: 140 mmol/L (ref 135–145)

## 2015-03-10 LAB — CBC
HEMATOCRIT: 27.2 % — AB (ref 39.0–52.0)
Hemoglobin: 8.2 g/dL — ABNORMAL LOW (ref 13.0–17.0)
MCH: 24.3 pg — ABNORMAL LOW (ref 26.0–34.0)
MCHC: 30.1 g/dL (ref 30.0–36.0)
MCV: 80.7 fL (ref 78.0–100.0)
PLATELETS: 278 10*3/uL (ref 150–400)
RBC: 3.37 MIL/uL — AB (ref 4.22–5.81)
RDW: 23.9 % — AB (ref 11.5–15.5)
WBC: 7.5 10*3/uL (ref 4.0–10.5)

## 2015-03-10 LAB — HEPARIN LEVEL (UNFRACTIONATED): Heparin Unfractionated: 0.58 IU/mL (ref 0.30–0.70)

## 2015-03-10 LAB — PROTIME-INR
INR: 2.11 — AB (ref 0.00–1.49)
Prothrombin Time: 23.5 seconds — ABNORMAL HIGH (ref 11.6–15.2)

## 2015-03-10 MED ORDER — WARFARIN SODIUM 2.5 MG PO TABS
2.5000 mg | ORAL_TABLET | Freq: Once | ORAL | Status: AC
  Administered 2015-03-10: 2.5 mg via ORAL
  Filled 2015-03-10 (×2): qty 1

## 2015-03-10 MED ORDER — PANTOPRAZOLE SODIUM 40 MG PO TBEC
40.0000 mg | DELAYED_RELEASE_TABLET | Freq: Every day | ORAL | Status: DC
  Administered 2015-03-11: 40 mg via ORAL

## 2015-03-10 MED ORDER — PANTOPRAZOLE SODIUM 40 MG IV SOLR
40.0000 mg | Freq: Two times a day (BID) | INTRAVENOUS | Status: DC
  Administered 2015-03-10: 40 mg via INTRAVENOUS
  Filled 2015-03-10 (×2): qty 40

## 2015-03-10 MED ORDER — METOPROLOL SUCCINATE ER 50 MG PO TB24
50.0000 mg | ORAL_TABLET | Freq: Every day | ORAL | Status: DC
  Filled 2015-03-10: qty 1

## 2015-03-10 MED ORDER — ASPIRIN EC 81 MG PO TBEC
81.0000 mg | DELAYED_RELEASE_TABLET | Freq: Every day | ORAL | Status: DC
  Administered 2015-03-10 – 2015-03-11 (×2): 81 mg via ORAL
  Filled 2015-03-10 (×2): qty 1

## 2015-03-10 NOTE — Progress Notes (Signed)
[facility] PROGRESS NOTE  Tanner Pena [id number]:[id number] DOB: 1940-12-04 DOA: 03/01/2015 PCP: [name], MD  HPI 74 y.o. Male admitted on 7/13 with severe abdominal pain in the setting of known advanced atherosclerotic disease and likely representing ischemic colitis. GI was consulted and patient underwent a repeat CTA on 7/14 with extensive aortoiliac atherosclerotic disease. Case was discussed with Dr. Laurence Ferrari from IR who will provide percutaneous intervention via stenting. Given mechanical valve and intervention in few days, patient's coumadin was discontinued and will bridge with heparin just prior to intervention. Patient will be transferred to [facility] on Sunday for IR evaluation Monday morning.   Subjective: -Patient states overall doing well, he has no complaints. He denies bloody stools or hematemesis. Tolerating by mouth intake denies further episodes of abdominal pain  Assessment/Plan: Abdominal pain  - patient with known arterial occlusive disease per CTA 2012, to result in mesenteric ischemia and ischemic ulcers of the colon. CTA 03/02/15 with dynamic changes for distal small bowel consistent with ischemia.  -Patient undergoing transcatheter placement of stent on 03/08/2015, procedure performed by interventional radiology. -Patient will need to stay on aspirin and Plavix for at least 3 months -Hemoccult coming back positive overnight, I wonder if there may be a slow bleed coming from ischemic colitis site particularly in the setting of anticoagulation and antiplatelets therapy. He remains hemodynamically stable and hemoglobin Improved some today.  Acute blood loss anemia due to GI bleed due to ischemic colitis - patient has received a total of 4U pRBC , Hb went from 7.1 >> 9.6>> 9.9 -On 03/09/2015 a.m. labs showing hemoglobin of 7.8, down from 9.4 -On 03/10/2015 repeat lab work revealed improvement in hemoglobin to 8.2. His Hemoccult did come back  positive. -As mentioned above there may be a slow bleed coming from intestine affected by ischemic colitis, aggravated by combination of antiplatelets therapy and anticoagulation. We'll not transfuse as there is improvement in hemoglobin. Will monitor for 1 more day, repeat lab work in a.m. Looking back at records it appears he underwent upper endoscopy recently in June 2016 that was normal.  Anticoagulation  - he was supratherapeutic on admission likely worsening his GI bleeding - on coumadin for St Jude AVR.  -This morning INR improving to 2.11  Acute renal failure.  - Resolved.   CAD:  - no chest pain. Continue plavix and fenofibrate.  Chronic systolic heart failure - continue lasix -daily weight and follow up intake/output - he is euvolemic without LE edema or crackles  DM2 (diabetes mellitus, type 2) - Holding amaryl while inpatient - Continue SSI.  -A1c 7.2   Code Status: full Family Communication: d/w family bedside Disposition Plan: Anticipate discharge home the next 24 hours if he remains stable Consultants:  GI  IR  Procedures:  none  Antibiotics:  Cipro 03/01/15>>  Flagyl 03/01/15>>  Objective: Filed Vitals:   03/10/15 1402  BP: 114/44  Pulse: 76  Temp: 98.7 F (37.1 C)  Resp: 18    Intake/Output Summary (Last 24 hours) at 03/10/15 1652 Last data filed at 03/10/15 1559  Gross per 24 hour  Intake   1560 ml  Output   1126 ml  Net    434 ml   Filed Weights   03/08/15 0458 03/09/15 0644 03/10/15 0700  Weight: 88.542 kg (195 lb 3.2 oz) 91.309 kg (201 lb 4.8 oz) 91.491 kg (201 lb 11.2 oz)   Exam:   General:  Appears comfortable, denies CP, no SOB  HEENT: no scleral  icterus, no nystagmus   Cardiovascular: RRR, no rubs or gallops; positive mechanic click from artificial valve   Respiratory: normal effort BS distant but clear  Abdomen: obese, soft, denies tenderness, positive BS  Musculoskeletal: no clubbing or cyanosis; left AKA. No right  LE edema  Data Reviewed: Basic Metabolic Panel:  Recent Labs Lab 03/05/15 0607 03/06/15 0312 03/07/15 0416 03/09/15 0345 03/10/15 0431  NA 140 141 137 141 140  K 3.5 3.5 3.4* 3.5 3.5  CL 110 109 106 113* 106  CO2 '24 26 23 24 26  '$ GLUCOSE 145* 159* 144* 170* 176*  BUN '14 12 16 17 11  '$ CREATININE 0.85 1.04 0.93 0.94 0.92  CALCIUM 8.5* 8.7* 8.4* 8.6* 8.6*   Liver Function Tests: No results for input(s): AST, ALT, ALKPHOS, BILITOT, PROT, ALBUMIN in the last 168 hours. CBC:  Recent Labs Lab 03/05/15 0607 03/06/15 0312 03/07/15 0416 03/09/15 0345 03/10/15 0431  WBC 7.7 7.0 7.5 7.5 7.5  HGB 9.6* 9.9* 9.4* 7.8* 8.2*  HCT 30.5* 32.0* 30.3* 25.7* 27.2*  MCV 76.8* 77.7* 78.9 79.3 80.7  PLT 293 302 289 254 278   BNP (last 3 results)  Recent Labs  12/28/14 0134 02/04/15 0413  BNP 504.3* 593.2*    ProBNP (last 3 results)  Recent Labs  05/10/14 0229 05/11/14 1530  PROBNP 820.3* 1302.0*    CBG:  Recent Labs Lab 03/09/15 1615 03/09/15 2139 03/10/15 0610 03/10/15 1144 03/10/15 1628  GLUCAP 186* 142* 171* 214* 192*    Studies: No results found.  Scheduled Meds: . albuterol  4 mg Oral BID  . allopurinol  300 mg Oral QHS  . aspirin EC  81 mg Oral Daily  . cholecalciferol  2,000 Units Oral Daily  . clopidogrel  75 mg Oral Q breakfast  . fenofibrate  160 mg Oral Daily  . ferrous sulfate  325 mg Oral Q breakfast  . furosemide  40 mg Oral Daily  . insulin aspart  0-5 Units Subcutaneous QHS  . insulin aspart  0-9 Units Subcutaneous TID WC  . isosorbide mononitrate  30 mg Oral Daily  . linagliptin  5 mg Oral Daily  . omega-3 acid ethyl esters  1 g Oral BID  . pantoprazole (PROTONIX) IV  40 mg Intravenous Q12H  . potassium chloride SA  20 mEq Oral Daily  . pregabalin  50 mg Oral BID  . rosuvastatin  20 mg Oral q1800  . sodium chloride  3 mL Intravenous Q12H  . sucralfate  1 g Oral BID  . vitamin C with rose hips  1,000 mg Oral Daily  . warfarin  2.5 mg  Oral ONCE-1800  . Warfarin - Pharmacist Dosing Inpatient   Does not apply q1800   Continuous Infusions: . heparin 1,900 Units/hr (03/10/15 1041)    Principal Problem:   Abdominal pain Active Problems:   H/O aortic valve replacement- St Jude   DM2 (diabetes mellitus, type 2)   COPD (chronic obstructive pulmonary disease)   HTN (hypertension)   S/P below knee amputation   Enteritis   Chronic mesenteric ischemia   IDA (iron deficiency anemia)   Chronic anticoagulation   Acute blood loss anemia   Elevated INR   Mesenteric ischemia, chronic   Chronic systolic heart failure   SMA stenosis   Time: 35 minutes  Abdulai Blaylock  [facility] Pager [phone]. If 7PM-7AM, please contact night-coverage at [email or web address], password [facility] 03/10/2015, 4:52 PM  LOS: 9 days

## 2015-03-10 NOTE — Progress Notes (Signed)
HR tonight has been in the low 40's, no S/S MD aware, MD put parameters for Metoprolol, will continue to monitor, Thanks Arvella Nigh RN

## 2015-03-10 NOTE — Progress Notes (Signed)
ANTICOAGULATION CONSULT NOTE - Follow Up Consult  Pharmacy Consult for Warfarin, heparin Indication: St Jude AVR  No Known Allergies  Patient Measurements: Height: 5' 10.5" (179.1 cm) Weight: 201 lb 11.2 oz (91.491 kg) IBW/kg (Calculated) : 74.15   Vital Signs: Temp: 98.7 F (37.1 C) (07/22 0700) Temp Source: Oral (07/22 0700) BP: 112/52 mmHg (07/22 0700) Pulse Rate: 65 (07/22 0700)  Labs:  Recent Labs  03/08/15 0254 03/09/15 0345 03/10/15 0431  HGB  --  7.8* 8.2*  HCT  --  25.7* 27.2*  PLT  --  254 278  LABPROT 19.5* 18.8* 23.5*  INR 1.64* 1.57* 2.11*  HEPARINUNFRC 0.51 0.53 0.58  CREATININE  --  0.94 0.92    Estimated Creatinine Clearance: 80.8 mL/min (by C-G formula based on Cr of 0.92).   Medications:  Scheduled:  . albuterol  4 mg Oral BID  . allopurinol  300 mg Oral QHS  . cholecalciferol  2,000 Units Oral Daily  . clopidogrel  75 mg Oral Q breakfast  . fenofibrate  160 mg Oral Daily  . ferrous sulfate  325 mg Oral Q breakfast  . furosemide  40 mg Oral Daily  . insulin aspart  0-5 Units Subcutaneous QHS  . insulin aspart  0-9 Units Subcutaneous TID WC  . isosorbide mononitrate  30 mg Oral Daily  . linagliptin  5 mg Oral Daily  . omega-3 acid ethyl esters  1 g Oral BID  . pantoprazole (PROTONIX) IV  40 mg Intravenous Q12H  . potassium chloride SA  20 mEq Oral Daily  . pregabalin  50 mg Oral BID  . rosuvastatin  20 mg Oral q1800  . sodium chloride  3 mL Intravenous Q12H  . sucralfate  1 g Oral BID  . vitamin C with rose hips  1,000 mg Oral Daily  . Warfarin - Pharmacist Dosing Inpatient   Does not apply q1800    Assessment: 74 yo male admitted with severe abdominal pain, likely representing ischemic colitis. Underwent CTA on 7/14 and subsequently percutaneous intervention via stenting per IR. Home coumadin was held and IV heparin started given mechanical valve  Current heparin level 0.58 within therapeutic range. Current INR 2.11 with large  increase overnight  Hgb trending down 9.9<8.2.  FOB positive  Goal of Therapy:  INR 2.5-3.5 Heparin level 0.3-0.7 Monitor platelets by anticoagulation protocol: Yes   Plan:  Decrease Heparin IV @ 1900 units/h Warfarin 2.5 mg x 1 dose tonight, try to resume home regimen Daily PT/INR Daily HL/CBC Mon s/sx bleeding   Harlee Eckroth P[place]et 03/10/2015,8:54 AM

## 2015-03-11 DIAGNOSIS — R791 Abnormal coagulation profile: Secondary | ICD-10-CM

## 2015-03-11 LAB — GLUCOSE, CAPILLARY
GLUCOSE-CAPILLARY: 139 mg/dL — AB (ref 65–99)
Glucose-Capillary: 267 mg/dL — ABNORMAL HIGH (ref 65–99)

## 2015-03-11 LAB — CBC
HCT: 26.1 % — ABNORMAL LOW (ref 39.0–52.0)
Hemoglobin: 7.9 g/dL — ABNORMAL LOW (ref 13.0–17.0)
MCH: 24.5 pg — ABNORMAL LOW (ref 26.0–34.0)
MCHC: 30.3 g/dL (ref 30.0–36.0)
MCV: 80.8 fL (ref 78.0–100.0)
PLATELETS: 256 10*3/uL (ref 150–400)
RBC: 3.23 MIL/uL — ABNORMAL LOW (ref 4.22–5.81)
RDW: 24.1 % — AB (ref 11.5–15.5)
WBC: 8.8 10*3/uL (ref 4.0–10.5)

## 2015-03-11 LAB — PROTIME-INR
INR: 2.76 — ABNORMAL HIGH (ref 0.00–1.49)
Prothrombin Time: 28.7 seconds — ABNORMAL HIGH (ref 11.6–15.2)

## 2015-03-11 LAB — HEPARIN LEVEL (UNFRACTIONATED): Heparin Unfractionated: 0.52 IU/mL (ref 0.30–0.70)

## 2015-03-11 MED ORDER — WARFARIN SODIUM 2 MG PO TABS: ORAL_TABLET | ORAL | Status: DC

## 2015-03-11 MED ORDER — FUROSEMIDE 40 MG PO TABS: 40.0000 mg | ORAL_TABLET | Freq: Every day | ORAL | Status: DC

## 2015-03-11 MED ORDER — CLOPIDOGREL BISULFATE 75 MG PO TABS: 75.0000 mg | ORAL_TABLET | Freq: Every day | ORAL | Status: DC

## 2015-03-11 MED ORDER — ASPIRIN 81 MG PO TBEC: 81.0000 mg | DELAYED_RELEASE_TABLET | Freq: Every day | ORAL | Status: DC

## 2015-03-11 NOTE — Discharge Summary (Addendum)
Physician Discharge Summary  HAMPTON WIXOM TDV:[id number] DOB: 11-22-1940 DOA: 03/01/2015  PCP: [name], MD  Admit date: 03/01/2015 Discharge date: 03/11/2015  Time spent: 35 minutes  Recommendations for Outpatient Follow-up:  1. Patient on Coumadin for mechanical valve, please follow-up on PT/INR on Monday, 03/13/2015. He had an INR of 2.76 on day of discharge  2. Please follow-up on a CBC in 2-3 days, patient having a GI bleed likely resulted from ischemic colitis. He was discharged on aspirin, Plavix, Coumadin. Had a hemoglobin of 7.9 on day of discharge 3. Follow-up on volume status, discharged on Lasix 40 mg daily. Diuretic dose decreased due to acute kidney injury 4. Prior to discharge he was set up with home health services for RN for disease management and obtaining lab work  Discharge Diagnoses:  Principal Problem:   Abdominal pain Active Problems:   H/O aortic valve replacement- St Jude   DM2 (diabetes mellitus, type 2)   COPD (chronic obstructive pulmonary disease)   HTN (hypertension)   S/P below knee amputation   Enteritis   Chronic mesenteric ischemia   IDA (iron deficiency anemia)   Chronic anticoagulation   Acute blood loss anemia   Elevated INR   Mesenteric ischemia, chronic   Chronic systolic heart failure   SMA stenosis   Discharge Condition: Stable  Diet recommendation: Heart healthy  Filed Weights   03/09/15 0644 03/10/15 0700 03/11/15 0546  Weight: 91.309 kg (201 lb 4.8 oz) 91.491 kg (201 lb 11.2 oz) 91.627 kg (202 lb)    History of present illness:  HILLEL CARD is a 74 y.o. male  This is a 74 year old man, diabetic, hypertensive, history of peripheral vascular disease and coronary artery disease with history of congestive heart failure now presents with abdominal pain which he is actually had for approximately one month. He is orally seen gastroenterology for this problem and consideration is being given to an ischemic colitis. He  came in from his PCP today because of worsening abdominal pain associated with subjective fever and rectal bleeding. He cannot specify how often he has had rectal bleeding. He denies any diarrhea, nausea or vomiting. A CT angiogram 3 years ago was consistent with mesenteric ischemia. Evaluation in the emergency room with a CT scan of the abdomen shows the following findings: IMPRESSION: Abnormal thick walled loops of small intestine, probably within the ileum. 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 The differential diagnosis does include inflammatory bowel disease and infectious inflammation. He is now being admitted for further investigation and management.  Hospital Course:  74 y.o. Male admitted on 7/13 with severe abdominal pain in the setting of known advanced atherosclerotic disease and likely representing ischemic colitis. GI was consulted and patient underwent a repeat CTA on 7/14 with extensive aortoiliac atherosclerotic disease. Case was discussed with Dr. Laurence Ferrari from IR who will provide percutaneous intervention via stenting. Given mechanical valve and intervention in few days, patient's coumadin was discontinued and will bridge with heparin just prior to intervention. He was transferred to [facility] where he underwent transcatheter stent placement of SMA  Abdominal pain  - patient with known arterial occlusive disease per CTA 2012, to result in mesenteric ischemia and ischemic ulcers of the colon. CTA 03/02/15 with dynamic changes for distal small bowel consistent with ischemia.  -Patient undergoing transcatheter placement of stent of SMA on 03/08/2015, procedure performed by interventional radiology. -Patient will need to stay on aspirin and Plavix  -He was  set up to follow up with Dr Earleen Newport of IR in two weeks.   Acute blood loss anemia due to GI bleed due to ischemic colitis - patient has received a total of 4U pRBC ,  Hb went from 7.1 >> 9.6>> 9.9 -On 03/09/2015 a.m. labs showing hemoglobin of 7.8, down from 9.4 -On 03/10/2015 repeat lab work revealed improvement in hemoglobin to 8.2. His Hemoccult did come back positive. -There may be a slow bleed coming from intestine affected by ischemic colitis, aggravated by combination of antiplatelets therapy and anticoagulation. He was found to be Hemoccult-positive. He was otherwise asymptomatic. I think overall his Hg remained stable in the last 3 days; 7.9 - 8.2 - 7.8. Of note he recently had an EGD done on 01/23/2015 that came back normal. I don't think he would benefit from further GI workup.  -He will need close outpatient follow up. [place] services set up with home RN for skilled assessment. I recommend a CBC be checked in 2-3 days.   Anticoagulation  - he was supratherapeutic on admission likely worsening his GI bleeding - on coumadin for St Jude AVR.  -He had an INR level of 2.76 on day of discharge. Repeat INR on Monday, 03/13/2015  Acute renal failure.  - Resolved.   CAD:  - no chest pain. Continue plavix and fenofibrate.  Chronic systolic heart failure - continue lasix -daily weight and follow up intake/output - he is euvolemic without LE edema or crackles  Procedures: US guided right CFA access, Abd aortogram, SMA angiogram, covered stenting of prox SMA stenosis, with completion SMA angiogram  Findings: No significant stenosis of the SMA after covered stenting.   Consultations:  Interventional radiology  Discharge Exam: Filed Vitals:   03/11/15 0546  BP: 115/44  Pulse: 50  Temp: 98.1 F (36.7 C)  Resp: 18     General: Appears comfortable, denies CP, no SOB  HEENT: no scleral icterus, no nystagmus   Cardiovascular: RRR, no rubs or gallops; positive mechanic click from artificial valve   Respiratory: normal effort BS distant but clear  Abdomen: obese, soft, denies tenderness, positive BS  Musculoskeletal: no clubbing or  cyanosis; left AKA. No right LE edema  Discharge Instructions   Discharge Instructions    (HEART FAILURE PATIENTS) Call MD:  Anytime you have any of the following symptoms: 1) 3 pound weight gain in 24 hours or 5 pounds in 1 week 2) shortness of breath, with or without a dry hacking cough 3) swelling in the hands, feet or stomach 4) if you have to sleep on extra pillows at night in order to breathe.    Complete by:  As directed      Call MD for:  difficulty breathing, headache or visual disturbances    Complete by:  As directed      Call MD for:  extreme fatigue    Complete by:  As directed      Call MD for:  hives    Complete by:  As directed      Call MD for:  persistant dizziness or light-headedness    Complete by:  As directed      Call MD for:  persistant nausea and vomiting    Complete by:  As directed      Call MD for:  redness, tenderness, or signs of infection (pain, swelling, redness, odor or green/yellow discharge around incision site)    Complete by:  As directed      Call MD for:  severe uncontrolled pain    Complete by:  As directed      Call MD for:  temperature >100.4    Complete by:  As directed      Diet - low sodium heart healthy    Complete by:  As directed      Discharge instructions    Complete by:  As directed   Take 2.5 mg of Coumadin this evening, followed by 5 mg by mouth daily except on Mondays take 2.5 mg     Increase activity slowly    Complete by:  As directed           Current Discharge Medication List    START taking these medications   Details  aspirin EC 81 MG EC tablet Take 1 tablet (81 mg total) by mouth daily. Qty: 30 tablet, Refills: 0      CONTINUE these medications which have CHANGED   Details  clopidogrel (PLAVIX) 75 MG tablet Take 1 tablet (75 mg total) by mouth daily with breakfast. Qty: 30 tablet, Refills: 11    furosemide (LASIX) 40 MG tablet Take 1 tablet (40 mg total) by mouth daily. Qty: 30 tablet, Refills: 1     warfarin (COUMADIN) 2 MG tablet Take 2.5 mg PO q daily Qty: 30 tablet, Refills: 1      CONTINUE these medications which have NOT CHANGED   Details  ACCU-CHEK AVIVA PLUS test strip     albuterol (PROVENTIL) 4 MG tablet Take 4 mg by mouth 3 (three) times daily.    albuterol-ipratropium (COMBIVENT) 18-103 MCG/ACT inhaler Inhale 1 puff into the lungs 4 (four) times daily. Coughing/ Shortness of Breath    allopurinol (ZYLOPRIM) 300 MG tablet Take 300 mg by mouth at bedtime.     Ascorbic Acid (VITAMIN C WITH ROSE HIPS) 1000 MG tablet Take 1,000 mg by mouth daily.    cholecalciferol (VITAMIN D) 1000 UNITS tablet Take 2,000 Units by mouth daily.    CRESTOR 20 MG tablet TAKE (1) TABLET BY MOUTH AT BEDTIME FOR CHOLESTEROL. Qty: 30 tablet, Refills: 3    Emollient (EUCERIN) lotion Apply 10 mLs topically as needed for dry skin.    fenofibrate (TRICOR) 145 MG tablet TAKE 1 TABLET BY MOUTH ONCE DAILY FOR CHOLESTEROL. Qty: 30 tablet, Refills: 3    ferrous sulfate 325 (65 FE) MG tablet Take 1 tablet (325 mg total) by mouth daily with breakfast. Qty: 30 tablet, Refills: 1    fish oil-omega-3 fatty acids 1000 MG capsule Take 1 capsule (1 g total) by mouth 2 (two) times daily.    glimepiride (AMARYL) 1 MG tablet Take 1 mg by mouth 2 (two) times daily.    isosorbide mononitrate (IMDUR) 30 MG 24 hr tablet TAKE ONE TABLET BY MOUTH ONCE DAILY. Qty: 30 tablet, Refills: 5    nitroGLYCERIN (NITROSTAT) 0.4 MG SL tablet Place 1 tablet (0.4 mg total) under the tongue every 5 (five) minutes as needed for chest pain. Qty: 25 tablet, Refills: 12    oxyCODONE-acetaminophen (PERCOCET) 10-325 MG per tablet Take 1 tablet by mouth every 4 (four) hours as needed for pain. Qty: 30 tablet, Refills: 0    pantoprazole (PROTONIX) 40 MG tablet Take 40 mg by mouth daily.    potassium chloride SA (K-DUR,KLOR-CON) 20 MEQ tablet Take 20 mEq by mouth daily.      pregabalin (LYRICA) 50 MG capsule Take one capsule by  mouth twice daily for pains Qty: 60 capsule, Refills: 5    sitaGLIPtin (JANUVIA)  50 MG tablet Take 50 mg by mouth daily.    sucralfate (CARAFATE) 1 G tablet Take 1 g by mouth 2 (two) times daily.     zinc gluconate 50 MG tablet Take 50 mg by mouth daily.    ramipril (ALTACE) 2.5 MG capsule Take 2.5 mg by mouth at bedtime.       STOP taking these medications     metoprolol succinate (TOPROL-XL) 50 MG 24 hr tablet      albuterol (VOSPIRE ER) 4 MG 12 hr tablet        No Known Allergies Follow-up Information    Follow up with Manus Rudd, MD In 2 weeks.   Specialty:  Gastroenterology   Contact information:   [address] [place] [address] [phone]       Follow up with [name], MD In 1 week.   Specialty:  Internal Medicine   Contact information:   [address] [place] [address] [phone]       Follow up with Outpatient Lab facility .   Why:  Patient will call or go to the outpatient lab facility on Monday 03/13/15 that he has used in the past for blood work.        The results of significant diagnostics from this hospitalization (including imaging, microbiology, ancillary and laboratory) are listed below for reference.    Significant Diagnostic Studies: Ct Abdomen Pelvis W Contrast  03/01/2015   CLINICAL DATA:  Generalized abdominal pain, several month duration. Diarrhea.  EXAM: CT ABDOMEN AND PELVIS WITH CONTRAST  TECHNIQUE: Multidetector CT imaging of the abdomen and pelvis was performed using the standard protocol following bolus administration of intravenous contrast.  CONTRAST:  48m OMNIPAQUE IOHEXOL 300 MG/ML  SOLN  COMPARISON:  02/05/2015  FINDINGS: There is chronic pleural and parenchymal scarring at the left base with calcification. No active chest disease is identified.  There is considerable motion degradation in the upper abdomen. The left lobe of the liver is prominent and there is slight lobulation of the right lobe of the  liver raising the possibility of early cirrhosis. There may be some sludge or noncalcified stones dependent in the gallbladder. The spleen is normal. The pancreas is normal. The adrenal glands are normal. No renal parenchymal lesion is seen. There is atrophy at the lower pole the right kidney. There is a punctate stone in the upper pole the right kidney. The kidneys do not appear to be excreting contrast, 3 minutes after injection. There is advanced atherosclerosis of the aorta and its branch vessels. The IVC is normal.  There are several small bowel loops probably in the ileum that show wall thickening. Ischemic changes a concern given the degree of vascular disease. Inflammatory bowel disease or infectious inflammation could have a similar appearance. No perforation. No free fluid. The colon appears normal today.  The bladder is thick walled. The prostate is slightly enlarged. No pelvic lymphadenopathy. There are extensive chronic degenerative changes of the spine. There is a small ventral hernia containing only fat.  IMPRESSION: Abnormal thick walled loops of small intestine, probably within the ileum. 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 The differential diagnosis does include inflammatory bowel disease and infectious inflammation.  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  Poor excretion of contrast 3 minutes after injection suggesting the possibility of severely impaired renal function.  These results will be called to the ordering clinician or representative by the Radiologist Assistant, and communication documented in the PACS  or zVision Dashboard.   Electronically Signed   By: Nelson Chimes M.D.   On: 03/01/2015 14:43   Ir Angiogram Visceral Selective  03/06/2015   CLINICAL DATA:  74 year old male with a history of signs and symptoms of chronic mesenteric ischemia, with abdominal pain and evidence of ischemic colitis on prior colonoscopy.  He has been  referred for evaluation of angiogram and possible stenting of the superior mesenteric artery.  EXAM: ULTRASOUND GUIDED ACCESS OF RIGHT COMMON FEMORAL ARTERY.  AORTIC ANGIOGRAM  SUPERIOR MESENTERIC ARTERY ANGIOGRAM  COVERED STENTING OF SIGNIFICANT STENOSIS OF THE SUPERIOR MESENTERIC ARTERY ORIGIN, WITH REPEAT SUPERIOR MESENTERIC ARTERY ANGIOGRAM  ANESTHESIA/SEDATION: 3.0 Mg IV Versed; 175 mcg IV Fentanyl.  Total Moderate Sedation Time: 120 minutes  Additional Medications: 5000 units intraoperative heparin bolus  FLUOROSCOPY TIME:  26 minutes 30 seconds  PROCEDURE: The procedure, risks, benefits, and alternatives were explained to the patient and to the patient's family. Questions regarding the procedure were encouraged and answered. Specific risks discussed include: Bleeding, infection, renal artery injury, contrast reaction, possible dialysis, arterial injury/ dissection, closure of the superior mesenteric artery, need for further procedure or surgery, disability, cardiopulmonary collapse, death. The patient understands and consents to the procedure.  The right groin was prepped with Betadine in a sterile fashion, and a sterile drape was applied covering the operative field. A sterile gown and sterile gloves were used for the procedure. Local anesthesia was provided with 1% Lidocaine.  Ultrasound survey of the right inguinal region was performed with images stored and sent to PACs.  A micropuncture needle was used access the right common femoral artery under ultrasound. With excellent arterial blood flow returned, and an 018 micro wire was passed through the needle, observed enter the abdominal aorta under fluoroscopy. The needle was removed, and a micropuncture sheath was placed over the wire. The inner dilator and wire were removed, and an 035 Bentson wire was advanced under fluoroscopy into the abdominal aorta. The sheath was removed and a standard 5 [place] vascular sheath was placed. The dilator was removed and  the sheath was flushed.  Five French pigtail Flush catheter was advanced into the aorta and an aortogram was completed, including lateral image.  The Amplatz wire was then placed through the pigtail catheter and a 6 French flexor Raabe sheath was placed.  Cobra catheter and Glidewire were used to select the superior mesenteric artery. Superior mesenteric artery angiogram was performed.  Once a Glidewire was across the stenosis, a 4 French glide cath was advanced into the distal superior mesenteric artery. A Rosen wire was then advanced into the artery. A combination of the Rosen wire, a 4 French glide cath, an 035 Amplatz wire, and the Cobra catheter were used to sequentially balloon angioplasty the stenotic origin of the superior mesenteric artery with a 4 mm diameter balloon and subsequently a 5 mm diameter balloon.  Once angioplasty had been performed, overlapping atrium balloon mounted covered stents were deployed across the disease of the proximal superior mesenteric artery. Distally a 5 mm x 22 mm covered stent was deployed with post dilation. Approximately a 6 mm x 22 mm covered stent was deployed extending slightly beyond the ostium, and overlapping the more distal stent by just less than 1 cm.  The stent construct was post dilated to 6 mm, and the stent at the ostium was post dilated to 8 mm.  Final angiogram was performed.  Patient tolerated the procedure well and remained hemodynamically stable throughout.  No complications were  encountered and no significant blood loss was encountered.  Manual pressure was used for hemostasis at the right common femoral artery.  COMPLICATIONS: None.  FINDINGS: Abdominal aorta g demonstrates atherosclerotic changes along the length of the aorta. No aneurysm or dissection flap is identified. Dense calcium present along the distal abdominal aorta.  Bilateral renal arteries are patent. There is approximately 50% stenosis at the origin of the left renal artery.  Temporarily,  the celiac artery fills with the renal arteries, with delayed filling of the superior mesenteric artery, compatible with hemodynamically significant stenosis.  Distally, there is 2 directional flow at the inferior mesenteric artery, likely with collateral filling from the left call it artery and the superior rectal artery.  Lumbar arteries are patent.  Extensive atherosclerotic changes of the bilateral iliac system. Bilateral hypogastric arteries are approximately 50% stenotic at the origins though patent distally. There is a stent at the distal right external iliac artery within the pelvis, with in stent stenosis of approximately 50% narrowing. The sheath was not occlusive during the case. Right common femoral artery atherosclerotic changes are greater than the left common femoral artery.  Plaque at the proximal 3 cm of the superior mesenteric artery contributes to significant stenosis and decreased filling though the artery remains patent. There is approximately 80 degree angulation beyond the first 4 cm of the superior mesenteric artery, of the location of significant calcification.  Status post balloon angioplasty and stenting of the proximal superior mesenteric artery there is resolution of the previous identified stenosis. Calcified plaque beyond the stents do not contribute to significant stenosis with improved filling. Further balloon angioplasty and stenting was not performed at this angulation of the artery to improve the angiographic outcome given the increased risk for dissection at this contour.  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  Angiogram demonstrates extensive changes of atherosclerotic disease involving the aorta and mesenteric vessels, including less than 50% stenosis of the  proximal left renal artery, hemodynamically significant stenosis at the origin of the inferior mesenteric artery, and significant disease of the bilateral iliac systems.  Signed,  Dulcy Fanny. Earleen Newport, DO  [facility]  [facility]  PLAN: The patient will have his right leg straight for 6 hours status post manual compression for hemostasis at the puncture site.  Hydration for 8 hours with normal saline.  Recommend continued Plavix for at least 3 months after the procedure for stent patency. 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   Electronically Signed   By: Corrie Mckusick D.O.   On: 03/06/2015 18:24   Ir US Guide Vasc Access Right  03/06/2015   CLINICAL DATA:  74 year old male with a history of signs and symptoms of chronic mesenteric ischemia, with abdominal pain and evidence of ischemic colitis on prior colonoscopy.  He has been referred for evaluation of angiogram and possible stenting of the superior mesenteric artery.  EXAM: ULTRASOUND GUIDED ACCESS OF RIGHT COMMON FEMORAL ARTERY.  AORTIC ANGIOGRAM  SUPERIOR MESENTERIC ARTERY ANGIOGRAM  COVERED STENTING OF SIGNIFICANT STENOSIS OF THE SUPERIOR MESENTERIC ARTERY ORIGIN, WITH REPEAT SUPERIOR MESENTERIC ARTERY ANGIOGRAM  ANESTHESIA/SEDATION: 3.0 Mg IV Versed; 175 mcg IV Fentanyl.  Total Moderate Sedation Time: 120 minutes  Additional Medications: 5000 units intraoperative heparin bolus  FLUOROSCOPY TIME:  26 minutes 30 seconds  PROCEDURE: The procedure, risks, benefits, and alternatives were explained to the patient and to the patient's family. Questions regarding the procedure were encouraged and answered. Specific risks discussed include: Bleeding, infection, renal artery injury, contrast reaction, possible dialysis, arterial injury/ dissection, closure of the superior mesenteric artery, need for  further procedure or surgery, disability, cardiopulmonary collapse, death. The patient understands and consents to the procedure.  The right groin was prepped with Betadine in a sterile fashion, and a sterile drape was applied covering the operative field. A sterile gown and sterile gloves were used for the procedure. Local anesthesia was provided with 1% Lidocaine.  Ultrasound survey of the right inguinal region was performed with images stored and sent to PACs.  A micropuncture needle was used access the right common femoral artery under ultrasound. With excellent arterial blood flow returned, and an 018 micro wire was passed through the needle, observed enter the abdominal aorta under fluoroscopy. The needle was removed, and a micropuncture sheath was placed over the wire. The inner dilator and wire were removed, and an 035 Bentson wire was advanced under fluoroscopy into the abdominal aorta. The sheath was removed and a standard 5 [place] vascular sheath was placed. The dilator was removed and the sheath was flushed.  Five French pigtail Flush catheter was advanced into the aorta and an aortogram was completed, including lateral image.  The Amplatz wire was then placed through the pigtail catheter and a 6 French flexor Raabe sheath was placed.  Cobra catheter and Glidewire were used to select the superior mesenteric artery. Superior mesenteric artery angiogram was performed.  Once a Glidewire was across the stenosis, a 4 French glide cath was advanced into the distal superior mesenteric artery. A Rosen wire was then advanced into the artery. A combination of the Rosen wire, a 4 French glide cath, an 035 Amplatz wire, and the Cobra catheter were used to sequentially balloon angioplasty the stenotic origin of the superior mesenteric artery with a 4 mm diameter balloon and subsequently a 5 mm diameter balloon.  Once angioplasty had been performed, overlapping atrium balloon mounted covered stents were deployed  across the disease of the proximal superior mesenteric artery. Distally a 5 mm x 22 mm covered stent was deployed with post dilation. Approximately a 6 mm x 22 mm covered stent was deployed extending slightly beyond the ostium, and overlapping the more distal stent by just less than 1 cm.  The stent construct was post dilated to 6 mm, and the stent at the ostium was post dilated to 8 mm.  Final angiogram was performed.  Patient tolerated the procedure well and remained hemodynamically stable throughout.  No complications were encountered and no significant blood loss was encountered.  Manual pressure was used for hemostasis at the right common femoral artery.  COMPLICATIONS: None.  FINDINGS: Abdominal aorta g demonstrates atherosclerotic changes along the length of the aorta. No aneurysm or dissection flap is identified. Dense calcium present along the distal abdominal aorta.  Bilateral renal arteries are patent. There is approximately 50% stenosis at the origin of the left renal artery.  Temporarily, the celiac artery fills with the renal arteries, with delayed filling of the superior mesenteric artery, compatible with hemodynamically significant stenosis.  Distally, there is 2 directional flow at the inferior mesenteric artery, likely with collateral filling from the left call it artery and the superior rectal artery.  Lumbar arteries are patent.  Extensive atherosclerotic changes of the bilateral iliac  system. Bilateral hypogastric arteries are approximately 50% stenotic at the origins though patent distally. There is a stent at the distal right external iliac artery within the pelvis, with in stent stenosis of approximately 50% narrowing. The sheath was not occlusive during the case. Right common femoral artery atherosclerotic changes are greater than the left common femoral artery.  Plaque at the proximal 3 cm of the superior mesenteric artery contributes to significant stenosis and decreased filling though the  artery remains patent. There is approximately 80 degree angulation beyond the first 4 cm of the superior mesenteric artery, of the location of significant calcification.  Status post balloon angioplasty and stenting of the proximal superior mesenteric artery there is resolution of the previous identified stenosis. Calcified plaque beyond the stents do not contribute to significant stenosis with improved filling. Further balloon angioplasty and stenting was not performed at this angulation of the artery to improve the angiographic outcome given the increased risk for dissection at this contour.  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  Signed,  Dulcy Fanny. Earleen Newport, DO  [facility]  [facility]  PLAN: The patient will have his right leg straight for 6 hours status post manual compression for hemostasis at the puncture site.  Hydration for 8 hours with normal saline.  Recommend continued Plavix for at least 3 months after the procedure for stent patency. 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   Electronically Signed   By: Corrie Mckusick D.O.   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  He has been referred for evaluation of angiogram and possible stenting of the superior mesenteric artery.  EXAM: ULTRASOUND GUIDED ACCESS OF RIGHT COMMON FEMORAL ARTERY.  AORTIC ANGIOGRAM  SUPERIOR MESENTERIC ARTERY ANGIOGRAM  COVERED STENTING OF SIGNIFICANT STENOSIS OF THE SUPERIOR MESENTERIC ARTERY ORIGIN, WITH REPEAT SUPERIOR MESENTERIC ARTERY ANGIOGRAM  ANESTHESIA/SEDATION: 3.0 Mg IV Versed; 175 mcg IV Fentanyl.  Total Moderate Sedation Time: 120 minutes  Additional Medications: 5000 units intraoperative heparin bolus  FLUOROSCOPY TIME:  26 minutes 30 seconds  PROCEDURE: The procedure, risks, benefits, and alternatives were explained to the patient and to the patient's family. Questions regarding the procedure were encouraged and answered. Specific risks discussed include: Bleeding, infection, renal artery injury, contrast reaction, possible dialysis, arterial injury/ dissection, closure of the superior mesenteric artery, need for further procedure or surgery, disability, cardiopulmonary collapse, death. The patient understands and consents to the procedure.  The right groin was prepped with Betadine in a sterile fashion, and a sterile drape was applied covering the operative field. A sterile gown and sterile gloves were used for the procedure. Local anesthesia was provided with 1% Lidocaine.  Ultrasound survey of the right inguinal region was performed with images stored and sent to  PACs.  A micropuncture needle was used access the right common femoral artery under ultrasound. With excellent arterial blood flow returned, and an 018 micro wire was passed through the needle, observed enter the abdominal aorta under fluoroscopy. The needle was removed, and a micropuncture sheath was placed over the wire. The inner dilator and wire were removed, and an 035 Bentson wire was advanced under fluoroscopy into the abdominal aorta. The  sheath was removed and a standard 5 [place] vascular sheath was placed. The dilator was removed and the sheath was flushed.  Five French pigtail Flush catheter was advanced into the aorta and an aortogram was completed, including lateral image.  The Amplatz wire was then placed through the pigtail catheter and a 6 French flexor Raabe sheath was placed.  Cobra catheter and Glidewire were used to select the superior mesenteric artery. Superior mesenteric artery angiogram was performed.  Once a Glidewire was across the stenosis, a 4 French glide cath was advanced into the distal superior mesenteric artery. A Rosen wire was then advanced into the artery. A combination of the Rosen wire, a 4 French glide cath, an 035 Amplatz wire, and the Cobra catheter were used to sequentially balloon angioplasty the stenotic origin of the superior mesenteric artery with a 4 mm diameter balloon and subsequently a 5 mm diameter balloon.  Once angioplasty had been performed, overlapping atrium balloon mounted covered stents were deployed across the disease of the proximal superior mesenteric artery. Distally a 5 mm x 22 mm covered stent was deployed with post dilation. Approximately a 6 mm x 22 mm covered stent was deployed extending slightly beyond the ostium, and overlapping the more distal stent by just less than 1 cm.  The stent construct was post dilated to 6 mm, and the stent at the ostium was post dilated to 8 mm.  Final angiogram was performed.  Patient tolerated the procedure well and remained hemodynamically stable throughout.  No complications were encountered and no significant blood loss was encountered.  Manual pressure was used for hemostasis at the right common femoral artery.  COMPLICATIONS: None.  FINDINGS: Abdominal aorta g demonstrates atherosclerotic changes along the length of the aorta. No aneurysm or dissection flap is identified. Dense calcium present along the distal abdominal aorta.  Bilateral renal arteries are  patent. There is approximately 50% stenosis at the origin of the left renal artery.  Temporarily, the celiac artery fills with the renal arteries, with delayed filling of the superior mesenteric artery, compatible with hemodynamically significant stenosis.  Distally, there is 2 directional flow at the inferior mesenteric artery, likely with collateral filling from the left call it artery and the superior rectal artery.  Lumbar arteries are patent.  Extensive atherosclerotic changes of the bilateral iliac system. Bilateral hypogastric arteries are approximately 50% stenotic at the origins though patent distally. There is a stent at the distal right external iliac artery within the pelvis, with in stent stenosis of approximately 50% narrowing. The sheath was not occlusive during the case. Right common femoral artery atherosclerotic changes are greater than the left common femoral artery.  Plaque at the proximal 3 cm of the superior mesenteric artery contributes to significant stenosis and decreased filling though the artery remains patent. There is approximately 80 degree angulation beyond the first 4 cm of the superior mesenteric artery, of the location of significant calcification.  Status post balloon angioplasty and stenting of the proximal superior mesenteric artery there is resolution of the previous identified stenosis. Calcified plaque beyond the  stents do not contribute to significant stenosis with improved filling. Further balloon angioplasty and stenting was not performed at this angulation of the artery to improve the angiographic outcome given the increased risk for dissection at this contour.  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  Angiogram demonstrates extensive changes of  atherosclerotic disease involving the aorta and mesenteric vessels, including less than 50% stenosis of the proximal left renal artery, hemodynamically significant stenosis at the origin of the inferior mesenteric artery, and significant disease of the bilateral iliac systems.  Signed,  Dulcy Fanny. Earleen Newport, DO  [facility]  [facility]  PLAN: The patient will have his right leg straight for 6 hours status post manual compression for hemostasis at the puncture site.  Hydration for 8 hours with normal saline.  Recommend continued Plavix for at least 3 months after the procedure for stent patency. 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   Electronically Signed   By: Corrie Mckusick D.O.   On: 03/06/2015 18:24   Ct Angio Abd/pel W/ And/or W/o  03/02/2015   CLINICAL DATA:  74 year old male with abdominal pain  EXAM: CTA ABDOMEN AND PELVIS wITHOUT AND WITH CONTRAST  TECHNIQUE: Multidetector CT imaging of the abdomen and pelvis was performed using the standard protocol during bolus administration of intravenous contrast. Multiplanar reconstructed images and MIPs were obtained and reviewed to evaluate the vascular anatomy.  CONTRAST:  158m OMNIPAQUE IOHEXOL 350 MG/ML SOLN  COMPARISON:  CT dated 03/01/2015  FINDINGS: Focal left posterior lung base pleural-based calcification likely related to prior surgery or scarring. No intra-abdominal free air or free fluid.  The liver, gallbladder, pancreas, spleen, right adrenal gland appear unremarkable. 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 There is no hydronephrosis on this side. The visualized ureters and the urinary bladder appear grossly unremarkable. Coarse  calcification of the prostate gland noted.  There is sigmoid diverticulosis without active inflammatory changes. Loose stool noted throughout the colon compatible with diarrheal state. Correlation with clinical exam and stool cultures recommended. There is segmental thickening of the distal ileum in the right hemipelvis which appears improved compared to the prior study compatible with residual segmental enteritis. There is no evidence of bowel obstruction. Normal appendix.  Extensive aortoiliac atherosclerotic disease. The origins of the celiac axis, SMA, and IMA as well as the origins of the renal arteries remain patent. No portal venous gas identified. There is no lymphadenopathy. There is mild haziness of the upper mesentery, likely reactive to inflammatory changes of the distal ileum.  Small fat containing umbilical hernia. Degenerative changes of the spine. No acute fracture.  Review of the MIP images confirms the above findings.  IMPRESSION: Segmental thickening of the distal ileum in the right hemipelvis compatible with enteritis. There has been interval improvement of the inflammatory changes compared to the prior study. There is no evidence of bowel obstruction. No free air or drainable fluid collection/ abscess identified.  Diarrheal state. Correlation with clinical exam and stool cultures recommended.   Electronically Signed   By: AAnner CreteM.D.   On: 03/02/2015 19:06    Microbiology: Recent  Results (from the past 240 hour(s))  Clostridium Difficile by PCR (not at [facility])     Status: None   Collection Time: 03/08/15  6:22 PM  Result Value Ref Range Status   C difficile by pcr NEGATIVE NEGATIVE Final     Labs: Basic Metabolic Panel:  Recent Labs Lab 03/05/15 0607 03/06/15 0312 03/07/15 0416 03/09/15 0345 03/10/15 0431  NA 140 141 137 141 140  K 3.5 3.5 3.4* 3.5 3.5  CL 110 109 106 113* 106  CO2 '24 26 23 24 26  '$ GLUCOSE 145* 159* 144* 170* 176*  BUN '14 12 16 17 11  '$ CREATININE  0.85 1.04 0.93 0.94 0.92  CALCIUM 8.5* 8.7* 8.4* 8.6* 8.6*   Liver Function Tests: No results for input(s): AST, ALT, ALKPHOS, BILITOT, PROT, ALBUMIN in the last 168 hours. No results for input(s): LIPASE, AMYLASE in the last 168 hours. No results for input(s): AMMONIA in the last 168 hours. CBC:  Recent Labs Lab 03/06/15 0312 03/07/15 0416 03/09/15 0345 03/10/15 0431 03/11/15 0305  WBC 7.0 7.5 7.5 7.5 8.8  HGB 9.9* 9.4* 7.8* 8.2* 7.9*  HCT 32.0* 30.3* 25.7* 27.2* 26.1*  MCV 77.7* 78.9 79.3 80.7 80.8  PLT 302 289 254 278 256   Cardiac Enzymes: No results for input(s): CKTOTAL, CKMB, CKMBINDEX, TROPONINI in the last 168 hours. BNP: BNP (last 3 results)  Recent Labs  12/28/14 0134 02/04/15 0413  BNP 504.3* 593.2*    ProBNP (last 3 results)  Recent Labs  05/10/14 0229 05/11/14 1530  PROBNP 820.3* 1302.0*    CBG:  Recent Labs Lab 03/10/15 0610 03/10/15 1144 03/10/15 1628 03/10/15 2135 03/11/15 0642  GLUCAP 171* 214* 192* 173* 139*       Signed:  Sherida Dobkins  [facility] 03/11/2015, 11:14 AM

## 2015-03-11 NOTE — Care Management Important Message (Signed)
Important Message  Patient Details  Name: Tanner Pena MRN: [id number] Date of Birth: 03/14/1941   Medicare Important Message Given:  Yes-fourth notification given    Serena Colonel, RN 03/11/2015, 12:40 PM

## 2015-03-11 NOTE — Care Management Note (Signed)
Case Management Note  Patient Details  Name: Tanner Pena MRN: [id number] Date of Birth: [birth year]  Subjective/Objective:                   [facility] s/p EGD 02/09/15 with normal findings-last episode 7/16  Action/Plan:  D/c planning.   Expected Discharge Date:    03/11/15             Expected Discharge Plan:  Home/Self Care  In-House Referral:  NA  Discharge planning Services  CM Consult  Post Acute Care Choice:  NA Choice offered to:  NA  DME Arranged:    DME Agency:     HH Arranged:    HH Agency:     Status of Service:  Completed  Medicare Important Message Given:  Yes-fourth notification given Date Medicare IM Given:    Medicare IM give by:    Date Additional Medicare IM Given:  03/11/15 Additional Medicare Important Message give by:  Sonda Rumble   If discussed at Long Length of Stay Meetings, dates discussed:  N/A  Additional Comments: 10:12am  Received verbal referral from patient's nurse Otila Kluver, states patient is going home today and will need HH.  Patient's chart reviewed and spoke with patient.  Patient requested to continue to obtain lab draws through the outpatient facility in [place] that he has gone to for years and does not feel like he will need HH.   He plans to go to the lab facility on Monday 03/13/15 for blood work and to contact [facility] on 03/14/15 to obtain follow up appointment for left leg prosthetic follow up.  Advised I will follow up with Dr. Coralyn Pear regarding [place] orders and call patient back with an update.  Spoke with Dr. Coralyn Pear, advised of above conversation with patient and he is in agreement with the plan since [facility] can not arranged with outpatient services.  Advised patient of my conversation with Dr. Coralyn Pear and he stated he or his daughter will schedule his follow up appointments with PCP & GI as directed by discharge instructions.   Patient verbalizes understanding of the importance of hospital follow up appointments.  No other  CM needs at this time.  Patient's nurse Otila Kluver updated on above.  AVS updated.   Serena Colonel, RN 03/11/2015, 6:09 PM

## 2015-03-11 NOTE — Significant Event (Addendum)
Discharge information given. IV and monitor d/c. Patient and belongings sent with dgt ,vss no c/o noted.

## 2015-03-13 ENCOUNTER — Other Ambulatory Visit: Payer: Self-pay | Admitting: Cardiovascular Disease

## 2015-03-13 DIAGNOSIS — Z7901 Long term (current) use of anticoagulants: Secondary | ICD-10-CM | POA: Diagnosis not present

## 2015-03-13 DIAGNOSIS — Z954 Presence of other heart-valve replacement: Secondary | ICD-10-CM | POA: Diagnosis not present

## 2015-03-13 LAB — POCT INR: INR: 2.6

## 2015-03-13 LAB — COAGUCHEK XS/INR WAIVED
INR: 2.6 — ABNORMAL HIGH (ref 0.9–1.1)
PROTHROMBIN TIME: 31.2 s

## 2015-03-14 ENCOUNTER — Ambulatory Visit (INDEPENDENT_AMBULATORY_CARE_PROVIDER_SITE_OTHER): Payer: Self-pay | Admitting: Pharmacist Clinician (PhC)/ Clinical Pharmacy Specialist

## 2015-03-14 DIAGNOSIS — Z952 Presence of prosthetic heart valve: Secondary | ICD-10-CM

## 2015-03-14 DIAGNOSIS — I25810 Atherosclerosis of coronary artery bypass graft(s) without angina pectoris: Secondary | ICD-10-CM | POA: Diagnosis not present

## 2015-03-14 DIAGNOSIS — Z7901 Long term (current) use of anticoagulants: Secondary | ICD-10-CM

## 2015-03-14 DIAGNOSIS — Z954 Presence of other heart-valve replacement: Secondary | ICD-10-CM

## 2015-03-15 DIAGNOSIS — G894 Chronic pain syndrome: Secondary | ICD-10-CM | POA: Diagnosis not present

## 2015-03-15 DIAGNOSIS — I10 Essential (primary) hypertension: Secondary | ICD-10-CM | POA: Diagnosis not present

## 2015-03-15 DIAGNOSIS — Z681 Body mass index (BMI) 19 or less, adult: Secondary | ICD-10-CM | POA: Diagnosis not present

## 2015-03-15 DIAGNOSIS — Z1389 Encounter for screening for other disorder: Secondary | ICD-10-CM | POA: Diagnosis not present

## 2015-03-15 DIAGNOSIS — J449 Chronic obstructive pulmonary disease, unspecified: Secondary | ICD-10-CM | POA: Diagnosis not present

## 2015-03-15 DIAGNOSIS — K550 Acute vascular disorders of intestine: Secondary | ICD-10-CM | POA: Diagnosis not present

## 2015-03-20 DIAGNOSIS — Z4781 Encounter for orthopedic aftercare following surgical amputation: Secondary | ICD-10-CM | POA: Diagnosis not present

## 2015-03-20 DIAGNOSIS — R262 Difficulty in walking, not elsewhere classified: Secondary | ICD-10-CM | POA: Diagnosis not present

## 2015-03-20 DIAGNOSIS — Z89512 Acquired absence of left leg below knee: Secondary | ICD-10-CM | POA: Diagnosis not present

## 2015-03-22 DIAGNOSIS — Z89512 Acquired absence of left leg below knee: Secondary | ICD-10-CM | POA: Diagnosis not present

## 2015-03-22 DIAGNOSIS — Z4781 Encounter for orthopedic aftercare following surgical amputation: Secondary | ICD-10-CM | POA: Diagnosis not present

## 2015-03-22 DIAGNOSIS — R262 Difficulty in walking, not elsewhere classified: Secondary | ICD-10-CM | POA: Diagnosis not present

## 2015-03-27 DIAGNOSIS — Z4781 Encounter for orthopedic aftercare following surgical amputation: Secondary | ICD-10-CM | POA: Diagnosis not present

## 2015-03-27 DIAGNOSIS — R262 Difficulty in walking, not elsewhere classified: Secondary | ICD-10-CM | POA: Diagnosis not present

## 2015-03-27 DIAGNOSIS — Z89512 Acquired absence of left leg below knee: Secondary | ICD-10-CM | POA: Diagnosis not present

## 2015-03-28 ENCOUNTER — Ambulatory Visit (INDEPENDENT_AMBULATORY_CARE_PROVIDER_SITE_OTHER): Payer: Self-pay | Admitting: Pharmacist Clinician (PhC)/ Clinical Pharmacy Specialist

## 2015-03-28 ENCOUNTER — Other Ambulatory Visit: Payer: Self-pay | Admitting: Cardiovascular Disease

## 2015-03-28 DIAGNOSIS — Z954 Presence of other heart-valve replacement: Secondary | ICD-10-CM

## 2015-03-28 DIAGNOSIS — Z952 Presence of prosthetic heart valve: Secondary | ICD-10-CM

## 2015-03-28 DIAGNOSIS — Z7901 Long term (current) use of anticoagulants: Secondary | ICD-10-CM | POA: Diagnosis not present

## 2015-03-28 LAB — COAGUCHEK XS/INR WAIVED
INR: 4.8 — ABNORMAL HIGH (ref 0.9–1.1)
Prothrombin Time: 57.9 s

## 2015-03-29 DIAGNOSIS — R262 Difficulty in walking, not elsewhere classified: Secondary | ICD-10-CM | POA: Diagnosis not present

## 2015-03-29 DIAGNOSIS — Z4781 Encounter for orthopedic aftercare following surgical amputation: Secondary | ICD-10-CM | POA: Diagnosis not present

## 2015-03-29 DIAGNOSIS — Z89512 Acquired absence of left leg below knee: Secondary | ICD-10-CM | POA: Diagnosis not present

## 2015-04-03 DIAGNOSIS — R262 Difficulty in walking, not elsewhere classified: Secondary | ICD-10-CM | POA: Diagnosis not present

## 2015-04-03 DIAGNOSIS — Z89512 Acquired absence of left leg below knee: Secondary | ICD-10-CM | POA: Diagnosis not present

## 2015-04-03 DIAGNOSIS — Z4781 Encounter for orthopedic aftercare following surgical amputation: Secondary | ICD-10-CM | POA: Diagnosis not present

## 2015-04-05 DIAGNOSIS — R262 Difficulty in walking, not elsewhere classified: Secondary | ICD-10-CM | POA: Diagnosis not present

## 2015-04-05 DIAGNOSIS — Z4781 Encounter for orthopedic aftercare following surgical amputation: Secondary | ICD-10-CM | POA: Diagnosis not present

## 2015-04-05 DIAGNOSIS — Z89512 Acquired absence of left leg below knee: Secondary | ICD-10-CM | POA: Diagnosis not present

## 2015-04-06 ENCOUNTER — Encounter: Payer: Self-pay | Admitting: Gastroenterology

## 2015-04-06 ENCOUNTER — Ambulatory Visit (INDEPENDENT_AMBULATORY_CARE_PROVIDER_SITE_OTHER): Payer: Medicare Other | Admitting: Gastroenterology

## 2015-04-06 VITALS — BP 90/60 | HR 66 | Temp 97.0°F

## 2015-04-06 DIAGNOSIS — D509 Iron deficiency anemia, unspecified: Secondary | ICD-10-CM

## 2015-04-06 DIAGNOSIS — K551 Chronic vascular disorders of intestine: Secondary | ICD-10-CM | POA: Diagnosis not present

## 2015-04-06 LAB — IRON AND TIBC
%SAT: 5 % — AB (ref 20–55)
Iron: 24 ug/dL — ABNORMAL LOW (ref 42–165)
TIBC: 524 ug/dL — AB (ref 215–435)
UIBC: 500 ug/dL — AB (ref 125–400)

## 2015-04-06 NOTE — Progress Notes (Signed)
Referring Provider: Redmond School, MD Primary Care Physician:  [name], MD  Primary GI: Dr. [name]   Chief Complaint  Patient presents with  . Follow-up    doing ok    HPI:   Tanner Pena is a 74 y.o. 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 Underwent stenting of proximal SMA stenosis on March 06, 2015. Also history notable for IDA, ischemic colonic ulcers on last colonoscopy in 2012.    Must stay on Plavix 75 mg po daily for at least 3 months, recommend lifetime.   EGD June 2016 with normal EGD, consider outpatient capsule if anemia worsens. Feels much improved. No overt GI bleeding. Tolerating diet. No GI complaints. Needs to see Dr. Earleen Newport in follow-up after stenting.   Past Medical History  Diagnosis Date  . Diabetes mellitus 2007  . Hypertension   . Gout   . Hypercholesteremia   . Peripheral vascular disease     stents lower extremity  . COPD (chronic obstructive pulmonary disease)   . S/P aortic valve replacement 1990    a. St. Jude  . Chronic back pain   . Dysphagia   . Neuromuscular disorder   . Peripheral neuropathy   . Critical lower limb ischemia   . CAD (coronary artery disease)     a. 05/13/14 [name] s/p overlapping DESx2 to SVG to RCA. b.  s/p CABG '90 with redo '94 & stent to RCA SVG in 2005  . Chronic toe ulcer     a. left foot  . Shortness of breath   . Myocardial infarction   . CHF (congestive heart failure)   . Stone in kidney   . GERD (gastroesophageal reflux disease)   . Arthritis   . Sleep apnea     tested greater than 7 years ago per patient    Past Surgical History  Procedure Laterality Date  . Open heart surgery  1990    prosthetic heart valve, one bypass  . Rotator cuff repair      right  . Cataract extraction      bilateral  . Coronary stent placement  2005      RCA vein graft A 3.0x13.0 TAXUS stent was then placed int he vessel a Viva 3.0x4.0 (perfusion balloon was made ready it was placed through the entire lenght of the stent  . Peripheral vascular procedures lower extremities      right external iliac  artery PTA and stenting as well as bilateral SFA intervention remotely. Repeat procedures in 2011 bilaterally  . Coronary artery bypass graft  1994    6 vessels  . [name]  06/13/2011    Procedure: Venia Minks DILATION;  Surgeon: [name], MD;  Location: [facility];  Service: Endoscopy;  Laterality: N/A;  Dilated to 56.   . Angioplasty illiac artery    . Back surgery  [id number]    2  . Eye surgery    . Amputation Left 07/13/2014    Procedure: Transmetatarsal Amputation;  Surgeon: [name], MD;  Location: [place];  Service: Orthopedics;  Laterality: Left;  . Lower extremity angiogram N/A 02/15/2013    Procedure: LOWER EXTREMITY ANGIOGRAM;  Surgeon: [name], MD;  Location: [facility] CATH LAB;  Service: Cardiovascular;  Laterality: N/A;  . Left heart catheterization with coronary angiogram N/A 05/11/2014    Procedure: LEFT  HEART CATHETERIZATION WITH CORONARY ANGIOGRAM;  Surgeon: [name], MD;  Location: [facility] CATH LAB;  Service: Cardiovascular;  Laterality: N/A;  . Percutaneous coronary stent intervention (pci-s) N/A 05/13/2014    Procedure: PERCUTANEOUS CORONARY STENT INTERVENTION (PCI-S);  Surgeon: [name], MD;  Location: [facility] CATH LAB;  Service: Cardiovascular;  Laterality: N/A;  . Lower extremity angiogram N/A 06/06/2014    Procedure: LOWER EXTREMITY ANGIOGRAM;  Surgeon: [name], MD;  Location: [facility] CATH LAB;  Service: Cardiovascular;  Laterality: N/A;  . Amputation Left 08/20/2014    Procedure: Revision Transmetatarsal Amputation versus Below Knee Amputation;  Surgeon: [name], MD;  Location: [place];  Service: Orthopedics;  Laterality: Left;  . Stump revision Left 09/23/2014    Procedure: Revision Left  Below Knee Amputation;  Surgeon: [name], MD;  Location: [place];  Service: Orthopedics;  Laterality: Left;  . Stump revision Left 10/13/2014    Procedure: REVISION LEFT BELOW KNEE AMPUTATION STUMP;  Surgeon: [name], MD;  Location: [facility];  Service: Orthopedics;  Laterality: Left;  . Esophagogastroduodenoscopy N/A 02/09/2015    DR. Schooler: Normal EGD  . Esophagogastroduodenoscopy  05/2011    Dr. [name]: s/p esophageal dilation, antral erosions/nodularity with benign biopsies  . Colonoscopy  05/2011    Dr. [name]: benign rectal polyp, left sided tics, ascending colonic ulcers (path c/w ischemia)    Current Outpatient Prescriptions  Medication Sig Dispense Refill  . albuterol (PROVENTIL) 4 MG tablet Take 4 mg by mouth 3 (three) times daily.    [name] albuterol-ipratropium (COMBIVENT) 18-103 MCG/ACT inhaler Inhale 1 puff into the lungs 4 (four) times daily. Coughing/ Shortness of Breath    . allopurinol (ZYLOPRIM) 300 MG tablet Take 300 mg by mouth at bedtime.     . Ascorbic Acid (VITAMIN C WITH ROSE HIPS) 1000 MG tablet Take 1,000 mg by mouth daily.    [name] aspirin EC 81 MG EC tablet Take 1 tablet (81 mg total) by mouth daily. 30 tablet 0  . cholecalciferol (VITAMIN D) 1000 UNITS tablet Take 2,000 Units by mouth daily.    . clopidogrel (PLAVIX) 75 MG tablet Take 1 tablet (75 mg total) by mouth daily with breakfast. 30 tablet 11  . CRESTOR 20 MG tablet TAKE (1) TABLET BY MOUTH AT BEDTIME FOR CHOLESTEROL. 30 tablet 3  . Emollient (EUCERIN) lotion Apply 10 mLs topically as needed for dry skin.    . fenofibrate (TRICOR) 145 MG tablet TAKE 1 TABLET BY MOUTH ONCE DAILY FOR CHOLESTEROL. (Patient taking differently: TAKE 1 TABLET BY MOUTH every evening FOR CHOLESTEROL.) 30 tablet 3  . fish oil-omega-3 fatty acids 1000 MG capsule Take 1 capsule (1 g total) by mouth 2 (two) times daily.    . furosemide (LASIX) 40 MG tablet Take 1 tablet (40 mg total) by mouth daily. 30 tablet 1  . glimepiride  (AMARYL) 1 MG tablet Take 1 mg by mouth 2 (two) times daily.    . isosorbide mononitrate (IMDUR) 30 MG 24 hr tablet TAKE ONE TABLET BY MOUTH ONCE DAILY. 30 tablet 5  . oxyCODONE-acetaminophen (PERCOCET) 10-325 MG per tablet Take 1 tablet by mouth every 4 (four) hours as needed for pain. (Patient taking differently: Take 1 tablet by mouth 2 (two) times daily as needed for pain. ) 30 tablet 0  . pantoprazole (PROTONIX) 40 MG tablet Take 40 mg by mouth daily.    . potassium chloride SA (K-DUR,KLOR-CON) 20 MEQ tablet Take 20 mEq by mouth daily.      [name]  pregabalin (LYRICA) 50 MG capsule Take one capsule by mouth twice daily for pains 60 capsule 5  . ramipril (ALTACE) 2.5 MG capsule Take 2.5 mg by mouth at bedtime.     . sitaGLIPtin (JANUVIA) 50 MG tablet Take 50 mg by mouth daily.    . sucralfate (CARAFATE) 1 G tablet Take 1 g by mouth 2 (two) times daily.     [name] warfarin (COUMADIN) 2 MG tablet Take 2.5 mg PO q daily 30 tablet 1  . zinc gluconate 50 MG tablet Take 50 mg by mouth daily.    [name] ACCU-CHEK AVIVA PLUS test strip     . ferrous sulfate 325 (65 FE) MG tablet Take 1 tablet (325 mg total) by mouth daily with breakfast. (Patient not taking: Reported on 04/06/2015) 30 tablet 1  . nitroGLYCERIN (NITROSTAT) 0.4 MG SL tablet Place 1 tablet (0.4 mg total) under the tongue every 5 (five) minutes as needed for chest pain. (Patient not taking: Reported on 04/06/2015) 25 tablet 12   No current facility-administered medications for this visit.    Allergies as of 04/06/2015  . (No Known Allergies)    Family History  Problem Relation Age of Onset  . Colon cancer Neg Hx   . Liver disease Neg Hx     Social History   Social History  . Marital Status: Legally Separated    Spouse Name: N/A  . Number of Children: 5  . Years of Education: N/A   Occupational History  . retired     Stage manager   Social History Main Topics  . Smoking status: Former Smoker -- 1.00 packs/day for 55 years    Types:  Cigarettes    Start date: 08/19/1953    Quit date: 05/07/2014  . Smokeless tobacco: Current User    Types: Chew    Last Attempt to Quit: 08/20/1995     Comment: Has quit on 3 occasions. Counseling given today 5-10 minutes   I am more than likely going to quit "  . Alcohol Use: No     Comment: socially, sometimes 12 ounce beer daily, may go month without/no whiskey  . Drug Use: No  . Sexual Activity: Not Asked   Other Topics Concern  . None   Social History Narrative   Has 3 daughters   Has 2 sons    Review of Systems: As mentioned in HPI  Physical Exam: BP 90/60 mmHg  Pulse 66  Temp(Src) 97 F (36.1 C) (Oral) General:   Alert and oriented. No distress noted. Pleasant and cooperative.  Head:  Normocephalic and atraumatic. Eyes:  Conjuctiva clear without scleral icterus. Abdomen:  +BS, soft, non-tender and non-distended. No rebound or guarding. No HSM or masses noted. Limited exam with patient in wheelchair.  Msk:  Left BKA Extremities:  Without edema. Neurologic:  Alert and  oriented x4;  grossly normal neurologically. Psych:  Alert and cooperative. Normal mood and affect.  Lab Results  Component Value Date   WBC 8.8 03/11/2015   HGB 7.9* 03/11/2015   HCT 26.1* 03/11/2015   MCV 80.8 03/11/2015   PLT 256 03/11/2015   June 2016: ferritin 6, iron 14, TIBC 732.

## 2015-04-06 NOTE — Patient Instructions (Signed)
Please have blood work done today.  I am helping to schedule the follow-up appointment with Dr. Earleen Newport, who placed your stent. You need to see them for follow-up.  We will see you back in 4-6 weeks.

## 2015-04-07 LAB — FERRITIN: Ferritin: 10 ng/mL — ABNORMAL LOW (ref 22–322)

## 2015-04-10 DIAGNOSIS — R262 Difficulty in walking, not elsewhere classified: Secondary | ICD-10-CM | POA: Diagnosis not present

## 2015-04-10 DIAGNOSIS — Z89512 Acquired absence of left leg below knee: Secondary | ICD-10-CM | POA: Diagnosis not present

## 2015-04-10 DIAGNOSIS — Z4781 Encounter for orthopedic aftercare following surgical amputation: Secondary | ICD-10-CM | POA: Diagnosis not present

## 2015-04-10 NOTE — Assessment & Plan Note (Signed)
Status post SMA stent July 2015. Symptoms resolved now and doing well from GI standpoint. Needs post-procedure follow-up with Dr. Earleen Newport. Will arrange from our office. Return in 4-6 weeks.

## 2015-04-10 NOTE — Assessment & Plan Note (Signed)
In setting of chronic anticoagulation. Known ischemic ulcers on last colonoscopy 2012. EGD up-to-date. Would be hesitant to proceed with colonoscopy at this exact time due to multiple health concerns. Will recheck iron, TIBC, ferritin. Continue iron supplementation. Consider capsule study and/or colonoscopy if worsening IDA. 4-6 week return.

## 2015-04-11 NOTE — Progress Notes (Signed)
cc'ed to pcp °

## 2015-04-12 DIAGNOSIS — Z89512 Acquired absence of left leg below knee: Secondary | ICD-10-CM | POA: Diagnosis not present

## 2015-04-12 DIAGNOSIS — Z4781 Encounter for orthopedic aftercare following surgical amputation: Secondary | ICD-10-CM | POA: Diagnosis not present

## 2015-04-12 DIAGNOSIS — R262 Difficulty in walking, not elsewhere classified: Secondary | ICD-10-CM | POA: Diagnosis not present

## 2015-04-13 ENCOUNTER — Other Ambulatory Visit: Payer: Self-pay | Admitting: Cardiovascular Disease

## 2015-04-13 ENCOUNTER — Telehealth: Payer: Self-pay | Admitting: Vascular Surgery

## 2015-04-13 DIAGNOSIS — Z954 Presence of other heart-valve replacement: Secondary | ICD-10-CM | POA: Diagnosis not present

## 2015-04-13 DIAGNOSIS — Z7901 Long term (current) use of anticoagulants: Secondary | ICD-10-CM | POA: Diagnosis not present

## 2015-04-13 LAB — COAGUCHEK XS/INR WAIVED
INR: 3.2 — AB (ref 0.9–1.1)
Prothrombin Time: 38.8 s

## 2015-04-13 LAB — PROTIME-INR: INR: 3.2 — AB (ref <=1.1)

## 2015-04-13 NOTE — Telephone Encounter (Signed)
-----   Message from Mena Goes, RN sent at 04/12/2015 11:38 AM EDT ----- Regarding: RE: F/u stents Unless he needs to follow up with whoever told IR to do the stents, GI maybe has some kind of post IR protocol. I don't see where anyone sent him back over here. But, If we do see him I would definitely do a ultrasound and a new doctor visit with Kellie Simmering.  ----- Message -----    From: Gena Fray    Sent: 04/12/2015  10:14 AM      To: Mena Goes, RN Subject: RE: F/u stents                                 I see where [facility] did a consult in 2015, which was also for ulcers. I guess we would consider this guy "new", and start with a mesenteric ultrasound. Would you agree?  Hinton Dyer ----- Message -----    From: Mena Goes, RN    Sent: 04/11/2015   3:51 PM      To: Gena Fray Subject: RE: F/u stents                                 Hinton Dyer, from what I see, we didn't do anything on this man, On 03-06-15 and IR doctor did a mesenteric stent. We haven't done anything since 2012 when Dr. Oneida Alar did an aortogram for non-healing foot ulcer.   ----- Message -----    From: Gena Fray    Sent: 04/11/2015   3:38 PM      To: [facility] Subject: F/u stents                                     Candy from [facility] called and stated that Mr Fitzhenry never followed up from his mesenteric stents. Would I schedule him for an ultrasound, or see JDL only?  Thanks, Hinton Dyer

## 2015-04-13 NOTE — Telephone Encounter (Signed)
I called [facility]- who asked for the follow up appointment originally to let them know that we had not see Mr Luhmann for his mesenteric issues. IR placed his stents. Per Ginger at [place], Gertie Fey, they just caught the "dropped ball" on his follow up from the [facility] and was trying to get him to the correct place. IR told them that they did not do follow ups. I advised Ginger that he would be considered a new patient. Ginger will ask her provider if he would like to refer him here and if so, will let me know.  Dpm

## 2015-04-14 ENCOUNTER — Ambulatory Visit (INDEPENDENT_AMBULATORY_CARE_PROVIDER_SITE_OTHER): Payer: Self-pay | Admitting: Pharmacist Clinician (PhC)/ Clinical Pharmacy Specialist

## 2015-04-14 DIAGNOSIS — Z952 Presence of prosthetic heart valve: Secondary | ICD-10-CM

## 2015-04-14 DIAGNOSIS — Z954 Presence of other heart-valve replacement: Secondary | ICD-10-CM

## 2015-04-14 DIAGNOSIS — I25810 Atherosclerosis of coronary artery bypass graft(s) without angina pectoris: Secondary | ICD-10-CM | POA: Diagnosis not present

## 2015-04-14 DIAGNOSIS — Z7901 Long term (current) use of anticoagulants: Secondary | ICD-10-CM

## 2015-04-16 ENCOUNTER — Telehealth: Payer: Self-pay | Admitting: Gastroenterology

## 2015-04-16 NOTE — Telephone Encounter (Signed)
Per Dr. Pasty Arch note from 7/18:  "Follow up with Dr Earleen Newport in [facility] in 2-4 weeks after DC home with mesenteric duplex exam. Would recommend 6 month mesenteric duplex, and then at 1 year. Then at 1 year intervals for restenosis."  I'm sorry if there was any confusion regarding where he needed to go. Dr. Pasty Arch note specifically stated to follow-up with him in 2-4 weeks.

## 2015-04-17 DIAGNOSIS — Z89512 Acquired absence of left leg below knee: Secondary | ICD-10-CM | POA: Diagnosis not present

## 2015-04-17 DIAGNOSIS — R262 Difficulty in walking, not elsewhere classified: Secondary | ICD-10-CM | POA: Diagnosis not present

## 2015-04-17 DIAGNOSIS — Z4781 Encounter for orthopedic aftercare following surgical amputation: Secondary | ICD-10-CM | POA: Diagnosis not present

## 2015-04-18 NOTE — Telephone Encounter (Signed)
Noted  

## 2015-04-19 DIAGNOSIS — Z89512 Acquired absence of left leg below knee: Secondary | ICD-10-CM | POA: Diagnosis not present

## 2015-04-19 DIAGNOSIS — Z4781 Encounter for orthopedic aftercare following surgical amputation: Secondary | ICD-10-CM | POA: Diagnosis not present

## 2015-04-19 DIAGNOSIS — R262 Difficulty in walking, not elsewhere classified: Secondary | ICD-10-CM | POA: Diagnosis not present

## 2015-04-27 ENCOUNTER — Other Ambulatory Visit: Payer: Self-pay | Admitting: Cardiovascular Disease

## 2015-04-27 DIAGNOSIS — Z89512 Acquired absence of left leg below knee: Secondary | ICD-10-CM | POA: Diagnosis not present

## 2015-04-27 DIAGNOSIS — Z7901 Long term (current) use of anticoagulants: Secondary | ICD-10-CM | POA: Diagnosis not present

## 2015-04-27 DIAGNOSIS — Z954 Presence of other heart-valve replacement: Secondary | ICD-10-CM | POA: Diagnosis not present

## 2015-04-27 DIAGNOSIS — R262 Difficulty in walking, not elsewhere classified: Secondary | ICD-10-CM | POA: Diagnosis not present

## 2015-04-27 LAB — COAGUCHEK XS/INR WAIVED
INR: 1.7 — ABNORMAL HIGH (ref 0.9–1.1)
Prothrombin Time: 20.6 s

## 2015-04-28 DIAGNOSIS — R262 Difficulty in walking, not elsewhere classified: Secondary | ICD-10-CM | POA: Diagnosis not present

## 2015-04-28 DIAGNOSIS — Z89512 Acquired absence of left leg below knee: Secondary | ICD-10-CM | POA: Diagnosis not present

## 2015-05-01 ENCOUNTER — Ambulatory Visit (INDEPENDENT_AMBULATORY_CARE_PROVIDER_SITE_OTHER): Payer: Self-pay | Admitting: Pharmacist Clinician (PhC)/ Clinical Pharmacy Specialist

## 2015-05-01 DIAGNOSIS — Z954 Presence of other heart-valve replacement: Secondary | ICD-10-CM

## 2015-05-01 DIAGNOSIS — R262 Difficulty in walking, not elsewhere classified: Secondary | ICD-10-CM | POA: Diagnosis not present

## 2015-05-01 DIAGNOSIS — Z7901 Long term (current) use of anticoagulants: Secondary | ICD-10-CM

## 2015-05-01 DIAGNOSIS — Z952 Presence of prosthetic heart valve: Secondary | ICD-10-CM

## 2015-05-01 DIAGNOSIS — Z89512 Acquired absence of left leg below knee: Secondary | ICD-10-CM | POA: Diagnosis not present

## 2015-05-03 DIAGNOSIS — R262 Difficulty in walking, not elsewhere classified: Secondary | ICD-10-CM | POA: Diagnosis not present

## 2015-05-03 DIAGNOSIS — Z89512 Acquired absence of left leg below knee: Secondary | ICD-10-CM | POA: Diagnosis not present

## 2015-05-08 DIAGNOSIS — R262 Difficulty in walking, not elsewhere classified: Secondary | ICD-10-CM | POA: Diagnosis not present

## 2015-05-08 DIAGNOSIS — Z89512 Acquired absence of left leg below knee: Secondary | ICD-10-CM | POA: Diagnosis not present

## 2015-05-15 ENCOUNTER — Other Ambulatory Visit: Payer: Self-pay

## 2015-05-15 ENCOUNTER — Ambulatory Visit (INDEPENDENT_AMBULATORY_CARE_PROVIDER_SITE_OTHER): Payer: Medicare Other | Admitting: Gastroenterology

## 2015-05-15 ENCOUNTER — Encounter: Payer: Self-pay | Admitting: Gastroenterology

## 2015-05-15 VITALS — BP 106/47 | HR 60 | Temp 97.3°F | Ht 69.0 in

## 2015-05-15 DIAGNOSIS — R262 Difficulty in walking, not elsewhere classified: Secondary | ICD-10-CM | POA: Diagnosis not present

## 2015-05-15 DIAGNOSIS — G894 Chronic pain syndrome: Secondary | ICD-10-CM | POA: Diagnosis not present

## 2015-05-15 DIAGNOSIS — D509 Iron deficiency anemia, unspecified: Secondary | ICD-10-CM

## 2015-05-15 DIAGNOSIS — Z1389 Encounter for screening for other disorder: Secondary | ICD-10-CM | POA: Diagnosis not present

## 2015-05-15 DIAGNOSIS — Z23 Encounter for immunization: Secondary | ICD-10-CM | POA: Diagnosis not present

## 2015-05-15 DIAGNOSIS — Z681 Body mass index (BMI) 19 or less, adult: Secondary | ICD-10-CM | POA: Diagnosis not present

## 2015-05-15 DIAGNOSIS — Z89512 Acquired absence of left leg below knee: Secondary | ICD-10-CM | POA: Diagnosis not present

## 2015-05-15 NOTE — Patient Instructions (Signed)
We will set you up for an iron infusion.  I would like to do a capsule study to see your small intestine.

## 2015-05-15 NOTE — Progress Notes (Signed)
Referring Provider: Redmond School, MD Primary Care Physician:  [name], MD  Primary GI: Dr. [name]   Chief Complaint  Patient presents with  . Follow-up    doing ok    HPI:   Tanner Pena is a 74 y.o. male presenting today with a history of multiple comorbidities to include DM, HTN, PVD/CAD, prosthetic heart valve, and recently inpatient July 2016 with abdominal pain secondary to mesenteric ischemia. Underwent stenting of proximal SMA stenosis on March 06, 2015. Also history notable for IDA, ischemic colonic ulcers on last colonoscopy in 2012. EGD June 2016 with normal EGD, consider outpatient capsule if anemia worsens. Feels much improved. No overt GI bleeding. Tolerating diet. No GI complaints. On chronic anticoagulation.   UNABLE TO TOLERATE ORAL IRON, noting nausea. No melena. Doesn't feel up to "par". Ferritin 10, only slightly improved from last check in June 2016, iron low at 24 but improving. Discussed iron infusion as he is unable to tolerate oral therapy.   Past Medical History  Diagnosis Date  . Diabetes mellitus 2007  . Hypertension   . Gout   . Hypercholesteremia   . Peripheral vascular disease     stents lower extremity  . COPD (chronic obstructive pulmonary disease)   . S/P aortic valve replacement 1990    a. St. Jude  . Chronic back pain   . Dysphagia   . Neuromuscular disorder   . Peripheral neuropathy   . Critical lower limb ischemia   . CAD (coronary artery disease)     a. 05/13/14 [name] s/p overlapping DESx2 to SVG to RCA. b.  s/p CABG '90 with redo '94 & stent to RCA SVG in 2005  . Chronic toe ulcer     a. left foot  . Shortness of breath   . Myocardial infarction   . CHF (congestive heart failure)   . Stone in kidney   . GERD (gastroesophageal reflux disease)   . Arthritis   . Sleep apnea     tested greater than 7 years ago per patient    Past Surgical History  Procedure Laterality Date  . Open heart surgery  1990    prosthetic  heart valve, one bypass  . Rotator cuff repair      right  . Cataract extraction      bilateral  . Coronary stent placement  2005    RCA vein graft A 3.0x13.0 TAXUS stent was then placed int he vessel a Viva 3.0x4.0 (perfusion balloon was made ready it was placed through the entire lenght of the stent  . Peripheral vascular procedures lower extremities      right external iliac  artery PTA and stenting as well as bilateral SFA intervention remotely. Repeat procedures in 2011 bilaterally  . Coronary artery bypass graft  1994    6 vessels  . [name]  06/13/2011    Procedure: Venia Minks DILATION;  Surgeon: [name], MD;  Location: [facility];  Service: Endoscopy;  Laterality: N/A;  Dilated to 56.   . Angioplasty illiac artery    . Back surgery  [id number]    2  . Eye surgery    . Amputation Left 07/13/2014    Procedure: Transmetatarsal Amputation;  Surgeon: [name], MD;  Location: [place];  Service: Orthopedics;  Laterality: Left;  . Lower extremity angiogram N/A 02/15/2013    Procedure: LOWER EXTREMITY ANGIOGRAM;  Surgeon: [name], MD;  Location: [facility] CATH LAB;  Service: Cardiovascular;  Laterality:  N/A;  . Left heart catheterization with coronary angiogram N/A 05/11/2014    Procedure: LEFT HEART CATHETERIZATION WITH CORONARY ANGIOGRAM;  Surgeon: [name], MD;  Location: [facility] C[facility] LAB;  Service: Cardiovascular;  Laterality: N/A;  . Percutaneous coronary stent intervention (pci-s) N/A 05/13/2014    Procedure: PERCUTANEOUS CORONARY STENT INTERVENTION (PCI-S);  Surgeon: [name], MD;  Location: [facility] CATH LAB;  Service: Cardiovascular;  Laterality: N/A;  . Lower extremity angiogram N/A 06/06/2014    Procedure: LOWER EXTREMITY ANGIOGRAM;  Surgeon: [name], MD;  Location: [facility] CATH LAB;  Service: Cardiovascular;  Laterality: N/A;  . Amputation Left 08/20/2014    Procedure: Revision Transmetatarsal Amputation versus Below Knee Amputation;  Surgeon: [name], MD;  Location: [place];  Service: Orthopedics;  Laterality: Left;  . Stump revision Left 09/23/2014    Procedure: Revision Left Below Knee Amputation;  Surgeon: [name], MD;  Location: [place];  Service: Orthopedics;  Laterality: Left;  . Stump revision Left 10/13/2014    Procedure: REVISION LEFT BELOW KNEE AMPUTATION STUMP;  Surgeon: [name], MD;  Location: [facility];  Service: Orthopedics;  Laterality: Left;  . Esophagogastroduodenoscopy N/A 02/09/2015    DR. Schooler: Normal EGD  . Esophagogastroduodenoscopy  05/2011    Dr. [name]: s/p esophageal dilation, antral erosions/nodularity with benign biopsies  . Colonoscopy  05/2011    Dr. [name]: benign rectal polyp, left sided tics, ascending colonic ulcers (path c/w ischemia)    Current Outpatient Prescriptions  Medication Sig Dispense Refill  . albuterol (PROVENTIL) 4 MG tablet Take 4 mg by mouth 3 (three) times daily.    [name] albuterol-ipratropium (COMBIVENT) 18-103 MCG/ACT inhaler Inhale 1 puff into the lungs 4 (four) times daily. Coughing/ Shortness of Breath    . allopurinol (ZYLOPRIM) 300 MG tablet Take 300 mg by mouth at bedtime.     . Ascorbic Acid (VITAMIN C WITH ROSE HIPS) 1000 MG tablet Take 1,000 mg by mouth daily.    [name] aspirin EC 81 MG EC tablet Take 1 tablet (81 mg total) by mouth daily. 30 tablet 0  . cholecalciferol (VITAMIN D) 1000 UNITS tablet Take 2,000 Units by mouth daily.    . clopidogrel (PLAVIX) 75 MG tablet Take 1 tablet (75 mg total) by mouth daily with breakfast. 30 tablet 11  . CRESTOR 20 MG tablet TAKE (1) TABLET BY MOUTH AT BEDTIME FOR CHOLESTEROL. 30 tablet 3  . Emollient (EUCERIN) lotion Apply 10 mLs topically as needed for dry skin.    . fenofibrate (TRICOR) 145 MG tablet TAKE 1 TABLET BY MOUTH ONCE DAILY FOR CHOLESTEROL. (Patient taking differently: TAKE 1 TABLET BY MOUTH every evening FOR CHOLESTEROL.) 30 tablet 3  . fish oil-omega-3 fatty acids 1000 MG capsule Take 1 capsule (1 g total) by  mouth 2 (two) times daily.    . furosemide (LASIX) 40 MG tablet Take 1 tablet (40 mg total) by mouth daily. 30 tablet 1  . glimepiride (AMARYL) 1 MG tablet Take 1 mg by mouth 2 (two) times daily.    . isosorbide mononitrate (IMDUR) 30 MG 24 hr tablet TAKE ONE TABLET BY MOUTH ONCE DAILY. 30 tablet 5  . oxyCODONE-acetaminophen (PERCOCET) 10-325 MG per tablet Take 1 tablet by mouth every 4 (four) hours as needed for pain. (Patient taking differently: Take 1 tablet by mouth 2 (two) times daily as needed for pain. ) 30 tablet 0  . pantoprazole (PROTONIX) 40 MG tablet Take 40 mg by mouth daily.    [name]  potassium chloride SA (K-DUR,KLOR-CON) 20 MEQ tablet Take 20 mEq by mouth daily.      . pregabalin (LYRICA) 50 MG capsule Take one capsule by mouth twice daily for pains 60 capsule 5  . ramipril (ALTACE) 2.5 MG capsule Take 2.5 mg by mouth at bedtime.     [name] ACCU-CHEK AVIVA PLUS test strip     . ferrous sulfate 325 (65 FE) MG tablet Take 1 tablet (325 mg total) by mouth daily with breakfast. (Patient not taking: Reported on 05/15/2015) 30 tablet 1  . nitroGLYCERIN (NITROSTAT) 0.4 MG SL tablet Place 1 tablet (0.4 mg total) under the tongue every 5 (five) minutes as needed for chest pain. (Patient not taking: Reported on 05/15/2015) 25 tablet 12  . sitaGLIPtin (JANUVIA) 50 MG tablet Take 50 mg by mouth daily.    . sucralfate (CARAFATE) 1 G tablet Take 1 g by mouth 2 (two) times daily.     [name] warfarin (COUMADIN) 2 MG tablet Take 2.5 mg PO q daily 30 tablet 1  . zinc gluconate 50 MG tablet Take 50 mg by mouth daily.     No current facility-administered medications for this visit.    Allergies as of 05/15/2015  . (No Known Allergies)    Family History  Problem Relation Age of Onset  . Colon cancer Neg Hx   . Liver disease Neg Hx     Social History   Social History  . Marital Status: Legally Separated    Spouse Name: N/A  . Number of Children: 5  . Years of Education: N/A   Occupational History  .  retired     Stage manager   Social History Main Topics  . Smoking status: Former Smoker -- 1.00 packs/day for 55 years    Types: Cigarettes    Start date: 08/19/1953    Quit date: 05/07/2014  . Smokeless tobacco: Current User    Types: Chew    Last Attempt to Quit: 08/20/1995     Comment: Has quit on 3 occasions. Counseling given today 5-10 minutes   I am more than likely going to quit "  . Alcohol Use: No     Comment: socially, sometimes 12 ounce beer daily, may go month without/no whiskey  . Drug Use: No  . Sexual Activity: Not Asked   Other Topics Concern  . None   Social History Narrative   Has 3 daughters   Has 2 sons    Review of Systems: Negative unless mentioned in HPI.   Physical Exam: BP 106/47 mmHg  Pulse 60  Temp(Src) 97.3 F (36.3 C) (Oral)  Ht '5\' 9"'$  (1.753 m) General:   Alert and oriented. No distress noted. Pleasant and cooperative.  Head:  Normocephalic and atraumatic. Eyes:  Conjuctiva clear without scleral icterus. Abdomen:  +BS, soft, non-tender and non-distended. No rebound or guarding. No HSM or masses noted. Msk:  Left BKA  Neurologic:  Alert and  oriented x4;  grossly normal neurologically. Psych:  Alert and cooperative. Normal mood and affect.  Lab Results  Component Value Date   WBC 8.8 03/11/2015   HGB 7.9* 03/11/2015   HCT 26.1* 03/11/2015   MCV 80.8 03/11/2015   PLT 256 03/11/2015   Lab Results  Component Value Date   IRON 24* 04/06/2015   TIBC 524* 04/06/2015   FERRITIN 10* 04/06/2015   Lab Results  Component Value Date   FERRITIN 10* 04/06/2015

## 2015-05-16 ENCOUNTER — Other Ambulatory Visit: Payer: Self-pay

## 2015-05-16 ENCOUNTER — Other Ambulatory Visit: Payer: Self-pay | Admitting: Gastroenterology

## 2015-05-16 DIAGNOSIS — K551 Chronic vascular disorders of intestine: Secondary | ICD-10-CM

## 2015-05-16 NOTE — Progress Notes (Signed)
Pt has an appointment with Dr. Earleen Newport on 05/18/15 @ 10:45. Pt is aware of appointment address([address]) and phone [phone]).

## 2015-05-17 ENCOUNTER — Encounter: Payer: Self-pay | Admitting: Internal Medicine

## 2015-05-17 ENCOUNTER — Telehealth: Payer: Self-pay | Admitting: Gastroenterology

## 2015-05-17 NOTE — Telephone Encounter (Signed)
APPT MADE AND LETTER SENT  °

## 2015-05-17 NOTE — Progress Notes (Signed)
CC'D TO PCP °

## 2015-05-17 NOTE — Telephone Encounter (Signed)
Please have patient return in 2 months.

## 2015-05-17 NOTE — Assessment & Plan Note (Signed)
In the setting of chronic anticoagulation with last colonoscopy in 2012 noting ischemic colonic ulcers, recent EGD normal, no capsule study on file. No overt GI bleeding. In setting of chronic anticoagulation, concern for small bowel etiology. Hold on updated colonoscopy now due to multiple health issues (recent stent placed due to SMA stenosis). Doing well overall but symptomatic with fatigue. Unable to tolerate oral iron. Will order feraheme dose X 1 and reassess. Capsule study now. May ultimately need repeat colonoscopy but await capsule study findings. Return in 2 months.

## 2015-05-18 ENCOUNTER — Ambulatory Visit
Admission: RE | Admit: 2015-05-18 | Discharge: 2015-05-18 | Disposition: A | Discharge status: Home / Self Care | Payer: Medicare Other | Source: Ambulatory Visit | Attending: Gastroenterology | Admitting: Gastroenterology

## 2015-05-18 ENCOUNTER — Other Ambulatory Visit: Payer: Self-pay

## 2015-05-18 ENCOUNTER — Other Ambulatory Visit: Payer: Self-pay | Admitting: Interventional Radiology

## 2015-05-18 DIAGNOSIS — K551 Chronic vascular disorders of intestine: Secondary | ICD-10-CM | POA: Diagnosis not present

## 2015-05-18 MED ORDER — PEG 3350-KCL-NA BICARB-NACL 420 G PO SOLR: 4000.0000 mL | Freq: Once | ORAL | Status: DC

## 2015-05-18 NOTE — Progress Notes (Signed)
Chief Complaint: Patient was seen in consultation today for  Chief Complaint  Patient presents with  . Advice Only    Mesenteric ischemia     at the request of Orvil Feil  Referring Physician(s): Ms Laban Emperor, NP, of Dr. Herbie Baltimore Rourk's office, GI.    History of Present Illness: Tanner Pena is a 74 y.o. 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    Mr Goldner presented to VIR service in July 2016, with somewhat atypical symptoms of chronic mesenteric ischemia, with episodic abdominal pain, and changes on CT of possible ischemia.  He has had a prior colonoscopy in 2012 which demonstrated ischemic ulcers.  Given his known atherosclerotic risk factors, changes on CT, history of ischemia, and high mortality associated with progression to acute ischemia, stenting was performed 03/04/2015.    He returns today for evaluation, and reports that he has been doing fine after his stenting. He has no acute abdominal pain associated with meals, and has recovered well. He has recently had an appointment at Dr. Roseanne Kaufman office, and since that appointment he states that he has developed some tarry stools/dark stools. He denies any syncope or presyncope. He denies any hematochezia or hematemesis. Currently he is taking Coumadin for mechanical heart valve, and he takes anti-platelet medicine including Plavix 75 mg daily for our postoperative stenting.      Past Medical History  Diagnosis Date  . Diabetes mellitus 2007  . Hypertension   . Gout   . Hypercholesteremia   . Peripheral vascular disease     stents lower extremity  . COPD (chronic obstructive pulmonary disease)   . S/P aortic valve replacement 1990    a. [employer]  . Chronic back pain   . Dysphagia   . Neuromuscular disorder   . Peripheral neuropathy   . Critical lower limb ischemia   . CAD (coronary artery disease)     a. 05/13/14 [name] s/p overlapping DESx2 to SVG to RCA. b.  s/p CABG  '90 with redo '94 & stent to RCA SVG in 2005  . Chronic toe ulcer     a. left foot  . Shortness of breath   . Myocardial infarction   . CHF (congestive heart failure)   . Stone in kidney   . GERD (gastroesophageal reflux disease)   . Arthritis   . Sleep apnea     tested greater than 7 years ago per patient    Past Surgical History  Procedure Laterality Date  . Open heart surgery  1990    prosthetic heart valve, one bypass  . Rotator cuff repair      right  . Cataract extraction      bilateral  . Coronary stent placement  2005    RCA vein graft A 3.0x13.0 TAXUS stent was then placed int he vessel a Viva 3.0x4.0 (perfusion balloon was made ready it was placed through the entire lenght of the stent  . Peripheral vascular procedures lower extremities      right external iliac  artery PTA and stenting as well as bilateral SFA intervention remotely. Repeat procedures in 2011 bilaterally  . Coronary artery bypass graft  1994    6 vessels  . [name]  06/13/2011    Procedure: Venia Minks DILATION;  Surgeon: [name], MD;  Location: [facility];  Service: Endoscopy;  Laterality: N/A;  Dilated to 56.   . Angioplasty illiac artery    . Back  surgery  4599,7741    2  . Eye surgery    . Amputation Left 07/13/2014    Procedure: Transmetatarsal Amputation;  Surgeon: [name], MD;  Location: [place];  Service: Orthopedics;  Laterality: Left;  . Lower extremity angiogram N/A 02/15/2013    Procedure: LOWER EXTREMITY ANGIOGRAM;  Surgeon: [name], MD;  Location: [facility] CATH LAB;  Service: Cardiovascular;  Laterality: N/A;  . Left heart catheterization with coronary angiogram N/A 05/11/2014    Procedure: LEFT HEART CATHETERIZATION WITH CORONARY ANGIOGRAM;  Surgeon: [name], MD;  Location: [facility] CATH LAB;  Service: Cardiovascular;  Laterality: N/A;  . Percutaneous coronary stent intervention (pci-s) N/A 05/13/2014    Procedure: PERCUTANEOUS CORONARY STENT INTERVENTION (PCI-S);   Surgeon: [name], MD;  Location: [facility] CATH LAB;  Service: Cardiovascular;  Laterality: N/A;  . Lower extremity angiogram N/A 06/06/2014    Procedure: LOWER EXTREMITY ANGIOGRAM;  Surgeon: [name], MD;  Location: [facility] CATH LAB;  Service: Cardiovascular;  Laterality: N/A;  . Amputation Left 08/20/2014    Procedure: Revision Transmetatarsal Amputation versus Below Knee Amputation;  Surgeon: [name], MD;  Location: [place];  Service: Orthopedics;  Laterality: Left;  . Stump revision Left 09/23/2014    Procedure: Revision Left Below Knee Amputation;  Surgeon: [name], MD;  Location: [place];  Service: Orthopedics;  Laterality: Left;  . Stump revision Left 10/13/2014    Procedure: REVISION LEFT BELOW KNEE AMPUTATION STUMP;  Surgeon: [name], MD;  Location: [facility];  Service: Orthopedics;  Laterality: Left;  . Esophagogastroduodenoscopy N/A 02/09/2015    DR. Schooler: Normal EGD  . Esophagogastroduodenoscopy  05/2011    Dr. [name]: s/p esophageal dilation, antral erosions/nodularity with benign biopsies  . Colonoscopy  05/2011    Dr. [name]: benign rectal polyp, left sided tics, ascending colonic ulcers (path c/w ischemia)    Allergies: Review of patient's allergies indicates no known allergies.  Medications: Prior to Admission medications   Medication Sig Start Date End Date Taking? Authorizing Provider  ACCU-CHEK AVIVA PLUS test strip  12/22/14   Historical Provider, MD  albuterol (PROVENTIL) 4 MG tablet Take 4 mg by mouth 3 (three) times daily. 01/20/15   Historical Provider, MD  albuterol-ipratropium (COMBIVENT) 18-103 MCG/ACT inhaler Inhale 1 puff into the lungs 4 (four) times daily. Coughing/ Shortness of Breath    Historical Provider, MD  allopurinol (ZYLOPRIM) 300 MG tablet Take 300 mg by mouth at bedtime.     Historical Provider, MD  Ascorbic Acid (VITAMIN C WITH ROSE HIPS) 1000 MG tablet Take 1,000 mg by mouth daily.    Historical Provider, MD  aspirin EC 81  MG EC tablet Take 1 tablet (81 mg total) by mouth daily. 03/11/15   Kelvin Cellar, MD  cholecalciferol (VITAMIN D) 1000 UNITS tablet Take 2,000 Units by mouth daily.    Historical Provider, MD  clopidogrel (PLAVIX) 75 MG tablet Take 1 tablet (75 mg total) by mouth daily with breakfast. 03/11/15   Kelvin Cellar, MD  CRESTOR 20 MG tablet TAKE (1) TABLET BY MOUTH AT BEDTIME FOR CHOLESTEROL. 01/19/15   [name], MD  Emollient (EUCERIN) lotion Apply 10 mLs topically as needed for dry skin.    Historical Provider, MD  fenofibrate (TRICOR) 145 MG tablet TAKE 1 TABLET BY MOUTH ONCE DAILY FOR CHOLESTEROL. Patient taking differently: TAKE 1 TABLET BY MOUTH every evening FOR CHOLESTEROL. 01/19/15   [name], MD  fish oil-omega-3 fatty acids 1000 MG  capsule Take 1 capsule (1 g total) by mouth 2 (two) times daily. 01/22/13   Casimiro Needle Alvstad, RPH-CPP  furosemide (LASIX) 40 MG tablet Take 1 tablet (40 mg total) by mouth daily. 03/11/15   Kelvin Cellar, MD  glimepiride (AMARYL) 1 MG tablet Take 1 mg by mouth 2 (two) times daily.    Historical Provider, MD  isosorbide mononitrate (IMDUR) 30 MG 24 hr tablet TAKE ONE TABLET BY MOUTH ONCE DAILY. 10/25/14   Troy Sine, MD  nitroGLYCERIN (NITROSTAT) 0.4 MG SL tablet Place 1 tablet (0.4 mg total) under the tongue every 5 (five) minutes as needed for chest pain. Patient not taking: Reported on 05/15/2015 05/17/14   Eileen Stanford, PA-C  oxyCODONE-acetaminophen (PERCOCET) 10-325 MG per tablet Take 1 tablet by mouth every 4 (four) hours as needed for pain. Patient taking differently: Take 1 tablet by mouth 2 (two) times daily as needed for pain.  09/23/14   [name], MD  pantoprazole (PROTONIX) 40 MG tablet Take 40 mg by mouth daily.    Historical Provider, MD  potassium chloride SA (K-DUR,KLOR-CON) 20 MEQ tablet Take 20 mEq by mouth daily.      Historical Provider, MD  pregabalin (LYRICA) 50 MG capsule Take one capsule by mouth twice daily for pains  08/23/14   Blanchie Serve, MD  ramipril (ALTACE) 2.5 MG capsule Take 2.5 mg by mouth at bedtime.  02/17/15   Historical Provider, MD  sitaGLIPtin (JANUVIA) 50 MG tablet Take 50 mg by mouth daily.    Historical Provider, MD  sucralfate (CARAFATE) 1 G tablet Take 1 g by mouth 2 (two) times daily.  02/15/15   Historical Provider, MD  warfarin (COUMADIN) 2 MG tablet Take 2.5 mg PO q daily 03/11/15   Kelvin Cellar, MD  zinc gluconate 50 MG tablet Take 50 mg by mouth daily.    Historical Provider, MD     Family History  Problem Relation Age of Onset  . Colon cancer Neg Hx   . Liver disease Neg Hx     Social History   Social History  . Marital Status: Legally Separated    Spouse Name: N/A  . Number of Children: 5  . Years of Education: N/A   Occupational History  . retired     Stage manager   Social History Main Topics  . Smoking status: Former Smoker -- 1.00 packs/day for 55 years    Types: Cigarettes    Start date: 08/19/1953    Quit date: 05/07/2014  . Smokeless tobacco: Current User    Types: Chew    Last Attempt to Quit: 08/20/1995     Comment: Has quit on 3 occasions. Counseling given today 5-10 minutes   I am more than likely going to quit "  . Alcohol Use: No     Comment: socially, sometimes 12 ounce beer daily, may go month without/no whiskey  . Drug Use: No  . Sexual Activity: Not on file   Other Topics Concern  . Not on file   Social History Narrative   Has 3 daughters   Has 2 sons     Review of Systems: A 12 point ROS discussed and pertinent positives are indicated in the HPI above.  All other systems are negative.  Review of Systems  Vital Signs: BP 129/43 mmHg  Pulse 53  Temp(Src) 97.6 F (36.4 C) (Oral)  Resp 14  Ht 5' 10.5" (1.791 m)  Wt 214 lb (97.07 kg)  BMI 30.26 kg/m2  SpO2 100%  Physical Exam  Targeted physical exam demonstrates no reproducible abdominal pain.   Imaging: No results found.  Labs:  CBC:  Recent Labs  03/07/15 0416  03/09/15 0345 03/10/15 0431 03/11/15 0305  WBC 7.5 7.5 7.5 8.8  HGB 9.4* 7.8* 8.2* 7.9*  HCT 30.3* 25.7* 27.2* 26.1*  PLT 289 254 278 256    COAGS:  Recent Labs  08/20/14 0615  09/23/14 1303  10/13/14 0704  02/04/15 0415  03/28/15 0849 04/13/15 04/13/15 0950 04/27/15 0951  INR 3.13*  < > 4.38*  < > 2.94*  < > 2.62*  < > 4.8* 3.2* 3.2* 1.7*  APTT 44*  --  53*  --  45*  --  36  --   --   --   --   --   < > = values in this interval not displayed.  BMP:  Recent Labs  03/06/15 0312 03/07/15 0416 03/09/15 0345 03/10/15 0431  NA 141 137 141 140  K 3.5 3.4* 3.5 3.5  CL 109 106 113* 106  CO2 '26 23 24 26  '$ GLUCOSE 159* 144* 170* 176*  BUN '12 16 17 11  '$ CALCIUM 8.7* 8.4* 8.6* 8.6*  CREATININE 1.04 0.93 0.94 0.92  GFRNONAA >60 >60 >60 >60  GFRAA >60 >60 >60 >60    LIVER FUNCTION TESTS:  Recent Labs  09/23/14 1303 12/28/14 0134 02/28/15 1204 03/01/15 1544 03/02/15 0609  BILITOT 0.6 0.8 0.3 0.6 0.5  AST '22 26 17 25 18  '$ ALT 12 13* 9 13* 12*  ALKPHOS 26* 30* 33* 34* 28*  PROT 5.7* 6.6 6.0 6.8 5.7*  ALBUMIN 3.3* 3.5  --  3.1* 2.6*    TUMOR MARKERS: No results for input(s): AFPTM, CEA, CA199, CHROMGRNA in the last 8760 hours.  Assessment and Plan:  Mr Tanzi presents today in follow-up, status post superior mesenteric artery stenting for chronic mesenteric ischemia, performed 03/04/2015.  Although he has had a recent appointment with his gastroenterology office, he has since developed dark tarry stools. He has not had any acute decompensation, hematemesis, or hematochezia, and the changes could simply be related to his anti-platelet and anti-coagulation therapy. I discussed his new changes with his NP, Ms Algernon Huxley at Dr. Roseanne Kaufman office, and we have developed a plan for follow-up, including changing Plavix medication to 81 mg aspirin, in order to continue his Coumadin, as well as potentially colonoscopy.  We will order a mesenteric duplex examination for surveillance  of the stent, although he will need to be nothing by mouth for this study, which she is not today.  He understands our course of action, and also understands that he should present to an emergency department for further evaluation should he have any acute changes such as syncope/fainting, or bright red blood per rectum.  1.) Outpatient Mesenteric Duplex Exam to evaluate SMA stent 2.) Ms Laban Emperor will follow up from Dr. Roseanne Kaufman office regarding any medication changes and possible scope.  Thank you.   SignedCorrie Mckusick 05/18/2015, 11:48 AM   I spent a total of    15 Minutes in face to face in clinical consultation, greater than 50% of which was counseling/coordinating care for chronic mesenteric ischemia, SP SMA stenting.

## 2015-05-18 NOTE — Progress Notes (Signed)
Received call from Dr. Earleen Newport. In the interim since I saw patient, he has had a few episodes of black stool. Patient is there today in hospital follow-up after stent placed due to SMA stenosis in July. Duplex study ordered to evaluate stent. Patient also notes vague lower abdominal discomfort. This is new since last visit.   I would like to change my plan from several days ago.  Will put capsule study on hold. This will likely need to be done in the future, but I would rather him have an updated COLONOSCOPY NOW, as his last was in 2012 and he has persistent IDA, possible melena, and he could have right-sided colonic lesion as culprit and/or small bowel etiology. If possible, if colon is negative, could we arrange for a capsule study that day as well? HIS LAST EGD WAS IN June 2016 and normal. He is on chronic Coumadin due to history of mechanical heart valve. Dr. Earleen Newport is going to stop Plavix and have him do an 81 mg Aspirin instead. With anticoagulation, could have occult blood loss anywhere in GI tract but EGD recently completed so we need to evaluate colon and small bowel possibly.    Ginger and Candy:  Cancel capsule study.  Arrange for colonoscopy with Dr. [name] WITH PROPOFOL ON COUMADIN. See if we can have +/- capsule study day of colonoscopy IF colonoscopy is negative.

## 2015-05-18 NOTE — Progress Notes (Signed)
Pt is set for TCS on 06/01/2015 with possible Capsule study at the same time.  Mailed instructions to patient and [facility]

## 2015-05-19 ENCOUNTER — Other Ambulatory Visit: Payer: Self-pay | Admitting: Cardiovascular Disease

## 2015-05-19 NOTE — Telephone Encounter (Signed)
REFILL 

## 2015-05-19 NOTE — Telephone Encounter (Signed)
Rx(s) sent to pharmacy electronically.  

## 2015-05-22 ENCOUNTER — Other Ambulatory Visit: Payer: Self-pay | Admitting: Physician Assistant

## 2015-05-22 NOTE — Telephone Encounter (Signed)
plavix refilled. toprol discontinued in July

## 2015-05-25 ENCOUNTER — Other Ambulatory Visit: Payer: Self-pay | Admitting: Pharmacist Clinician (PhC)/ Clinical Pharmacy Specialist

## 2015-05-25 MED ORDER — WARFARIN SODIUM 5 MG PO TABS: ORAL_TABLET | ORAL | Status: DC

## 2015-05-25 NOTE — Patient Instructions (Signed)
Tanner Pena  05/25/2015     '@PREFPERIOPPHARMACY'$ @   Your procedure is scheduled on  06/01/2015   Report to [facility] at  730  A.M.  Call this number if you have problems the morning of surgery:  [phone]   Remember:  Do not eat food or drink liquids after midnight.  Take these medicines the morning of surgery with A SIP OF WATER  Albuterol, imdur, metoprolol, pxycodone, lyrica, altace. Take your inhaler before you cone and bring it with you. DO NOT take any medicine for diabetes the morning of surgery.   Do not wear jewelry, make-up or nail polish.  Do not wear lotions, powders, or perfumes.  You may wear deodorant.  Do not shave 48 hours prior to surgery.  Men may shave face and neck.  Do not bring valuables to the hospital.  [facility] is not responsible for any belongings or valuables.  Contacts, dentures or bridgework may not be worn into surgery.  Leave your suitcase in the car.  After surgery it may be brought to your room.  For patients admitted to the hospital, discharge time will be determined by your treatment team.  Patients discharged the day of surgery will not be allowed to drive home.   Name and phone number of your driver:   family Special instructions:  Follow the diet and prep instructions given to you by Dr Roseanne Kaufman office.  Please read over the following fact sheets that you were given. Pain Booklet, Coughing and Deep Breathing, Surgical Site Infection Prevention, Anesthesia Post-op Instructions and Care and Recovery After Surgery      Colonoscopy A colonoscopy is an exam to look at the entire large intestine (colon). This exam can help find problems such as tumors, polyps, inflammation, and areas of bleeding. The exam takes about 1 hour.  LET [facility] CARE PROVIDER KNOW ABOUT:   Any allergies you have.  All medicines you are taking, including vitamins, herbs, eye drops, creams, and over-the-counter medicines.  Previous problems  you or members of your family have had with the use of anesthetics.  Any blood disorders you have.  Previous surgeries you have had.  Medical conditions you have. RISKS AND COMPLICATIONS  Generally, this is a safe procedure. However, as with any procedure, complications can occur. Possible complications include:  Bleeding.  Tearing or rupture of the colon wall.  Reaction to medicines given during the exam.  Infection (rare). BEFORE THE PROCEDURE   Ask your health care provider about changing or stopping your regular medicines.  You may be prescribed an oral bowel prep. This involves drinking a large amount of medicated liquid, starting the day before your procedure. The liquid will cause you to have multiple loose stools until your stool is almost clear or light green. This cleans out your colon in preparation for the procedure.  Do not eat or drink anything else once you have started the bowel prep, unless your health care provider tells you it is safe to do so.  Arrange for someone to drive you home after the procedure. PROCEDURE   You will be given medicine to help you relax (sedative).  You will lie on your side with your knees bent.  A long, flexible tube with a light and camera on the end (colonoscope) will be inserted through the rectum and into the colon. The camera sends video back to a computer screen as it moves through the colon. The colonoscope  also releases carbon dioxide gas to inflate the colon. This helps your health care provider see the area better.  During the exam, your health care provider may take a small tissue sample (biopsy) to be examined under a microscope if any abnormalities are found.  The exam is finished when the entire colon has been viewed. AFTER THE PROCEDURE   Do not drive for 24 hours after the exam.  You may have a small amount of blood in your stool.  You may pass moderate amounts of gas and have mild abdominal cramping or bloating.  This is caused by the gas used to inflate your colon during the exam.  Ask when your test results will be ready and how you will get your results. Make sure you get your test results.   This information is not intended to replace advice given to you by your health care provider. Make sure you discuss any questions you have with your health care provider.   Document Released: 08/02/2000 Document Revised: 05/26/2013 Document Reviewed: 04/12/2013 Elsevier Interactive Patient Education 2016 [employer] PATIENT INSTRUCTIONS POST-ANESTHESIA  IMMEDIATELY FOLLOWING SURGERY:  Do not drive or operate machinery for the first twenty four hours after surgery.  Do not make any important decisions for twenty four hours after surgery or while taking narcotic pain medications or sedatives.  If you develop intractable nausea and vomiting or a severe headache please notify your doctor immediately.  FOLLOW-UP:  Please make an appointment with your surgeon as instructed. You do not need to follow up with anesthesia unless specifically instructed to do so.  WOUND CARE INSTRUCTIONS (if applicable):  Keep a dry clean dressing on the anesthesia/puncture wound site if there is drainage.  Once the wound has quit draining you may leave it open to air.  Generally you should leave the bandage intact for twenty four hours unless there is drainage.  If the epidural site drains for more than 36-48 hours please call the anesthesia department.  QUESTIONS?:  Please feel free to call your physician or the hospital operator if you have any questions, and they will be happy to assist you.

## 2015-05-26 DIAGNOSIS — Z89512 Acquired absence of left leg below knee: Secondary | ICD-10-CM | POA: Diagnosis not present

## 2015-05-26 DIAGNOSIS — R262 Difficulty in walking, not elsewhere classified: Secondary | ICD-10-CM | POA: Diagnosis not present

## 2015-05-29 ENCOUNTER — Encounter (HOSPITAL_COMMUNITY)
Admission: RE | Admit: 2015-05-29 | Discharge: 2015-05-29 | Disposition: A | Discharge status: Home / Self Care | Payer: Medicare Other | Source: Ambulatory Visit | Attending: Internal Medicine | Admitting: Internal Medicine

## 2015-05-30 ENCOUNTER — Other Ambulatory Visit: Payer: Self-pay

## 2015-05-30 ENCOUNTER — Encounter (HOSPITAL_COMMUNITY): Payer: Self-pay

## 2015-05-30 ENCOUNTER — Encounter (HOSPITAL_COMMUNITY)
Admission: RE | Admit: 2015-05-30 | Discharge: 2015-05-30 | Disposition: A | Discharge status: Home / Self Care | Payer: Medicare Other | Source: Ambulatory Visit | Attending: Internal Medicine | Admitting: Internal Medicine

## 2015-05-30 ENCOUNTER — Encounter (HOSPITAL_COMMUNITY): Payer: Self-pay | Admitting: Emergency Medicine

## 2015-05-30 ENCOUNTER — Telehealth: Payer: Self-pay

## 2015-05-30 ENCOUNTER — Other Ambulatory Visit (HOSPITAL_COMMUNITY): Payer: Self-pay

## 2015-05-30 ENCOUNTER — Inpatient Hospital Stay (HOSPITAL_COMMUNITY)
Admission: EM | Admit: 2015-05-30 | Discharge: 2015-06-02 | DRG: 378 | Disposition: A | Discharge status: Home / Self Care | Payer: Medicare Other | Attending: Internal Medicine | Admitting: Internal Medicine

## 2015-05-30 ENCOUNTER — Emergency Department (HOSPITAL_COMMUNITY): Payer: Medicare Other

## 2015-05-30 DIAGNOSIS — E1159 Type 2 diabetes mellitus with other circulatory complications: Secondary | ICD-10-CM

## 2015-05-30 DIAGNOSIS — D6832 Hemorrhagic disorder due to extrinsic circulating anticoagulants: Secondary | ICD-10-CM

## 2015-05-30 DIAGNOSIS — D649 Anemia, unspecified: Secondary | ICD-10-CM | POA: Diagnosis present

## 2015-05-30 DIAGNOSIS — I5043 Acute on chronic combined systolic (congestive) and diastolic (congestive) heart failure: Secondary | ICD-10-CM

## 2015-05-30 DIAGNOSIS — I509 Heart failure, unspecified: Secondary | ICD-10-CM

## 2015-05-30 DIAGNOSIS — Z1211 Encounter for screening for malignant neoplasm of colon: Secondary | ICD-10-CM

## 2015-05-30 DIAGNOSIS — N181 Chronic kidney disease, stage 1: Secondary | ICD-10-CM

## 2015-05-30 DIAGNOSIS — N179 Acute kidney failure, unspecified: Secondary | ICD-10-CM | POA: Diagnosis present

## 2015-05-30 DIAGNOSIS — Z7902 Long term (current) use of antithrombotics/antiplatelets: Secondary | ICD-10-CM | POA: Diagnosis not present

## 2015-05-30 DIAGNOSIS — K529 Noninfective gastroenteritis and colitis, unspecified: Secondary | ICD-10-CM

## 2015-05-30 DIAGNOSIS — Z7982 Long term (current) use of aspirin: Secondary | ICD-10-CM | POA: Diagnosis not present

## 2015-05-30 DIAGNOSIS — Z79891 Long term (current) use of opiate analgesic: Secondary | ICD-10-CM | POA: Diagnosis not present

## 2015-05-30 DIAGNOSIS — E119 Type 2 diabetes mellitus without complications: Secondary | ICD-10-CM | POA: Diagnosis present

## 2015-05-30 DIAGNOSIS — Z952 Presence of prosthetic heart valve: Secondary | ICD-10-CM | POA: Diagnosis not present

## 2015-05-30 DIAGNOSIS — D509 Iron deficiency anemia, unspecified: Secondary | ICD-10-CM

## 2015-05-30 DIAGNOSIS — I5022 Chronic systolic (congestive) heart failure: Secondary | ICD-10-CM | POA: Diagnosis present

## 2015-05-30 DIAGNOSIS — Z89612 Acquired absence of left leg above knee: Secondary | ICD-10-CM | POA: Diagnosis not present

## 2015-05-30 DIAGNOSIS — K922 Gastrointestinal hemorrhage, unspecified: Secondary | ICD-10-CM | POA: Diagnosis not present

## 2015-05-30 DIAGNOSIS — R791 Abnormal coagulation profile: Secondary | ICD-10-CM | POA: Diagnosis not present

## 2015-05-30 DIAGNOSIS — R0602 Shortness of breath: Secondary | ICD-10-CM

## 2015-05-30 DIAGNOSIS — J9601 Acute respiratory failure with hypoxia: Secondary | ICD-10-CM

## 2015-05-30 DIAGNOSIS — K5790 Diverticulosis of intestine, part unspecified, without perforation or abscess without bleeding: Secondary | ICD-10-CM | POA: Diagnosis present

## 2015-05-30 DIAGNOSIS — I10 Essential (primary) hypertension: Secondary | ICD-10-CM

## 2015-05-30 DIAGNOSIS — K219 Gastro-esophageal reflux disease without esophagitis: Secondary | ICD-10-CM | POA: Diagnosis not present

## 2015-05-30 DIAGNOSIS — Z79899 Other long term (current) drug therapy: Secondary | ICD-10-CM

## 2015-05-30 DIAGNOSIS — J449 Chronic obstructive pulmonary disease, unspecified: Secondary | ICD-10-CM | POA: Diagnosis not present

## 2015-05-30 DIAGNOSIS — R1310 Dysphagia, unspecified: Secondary | ICD-10-CM | POA: Diagnosis not present

## 2015-05-30 DIAGNOSIS — D62 Acute posthemorrhagic anemia: Secondary | ICD-10-CM | POA: Diagnosis not present

## 2015-05-30 DIAGNOSIS — J438 Other emphysema: Secondary | ICD-10-CM

## 2015-05-30 DIAGNOSIS — I252 Old myocardial infarction: Secondary | ICD-10-CM

## 2015-05-30 DIAGNOSIS — J189 Pneumonia, unspecified organism: Secondary | ICD-10-CM

## 2015-05-30 DIAGNOSIS — Z89512 Acquired absence of left leg below knee: Secondary | ICD-10-CM

## 2015-05-30 DIAGNOSIS — E1122 Type 2 diabetes mellitus with diabetic chronic kidney disease: Secondary | ICD-10-CM

## 2015-05-30 DIAGNOSIS — Z951 Presence of aortocoronary bypass graft: Secondary | ICD-10-CM | POA: Diagnosis not present

## 2015-05-30 DIAGNOSIS — K551 Chronic vascular disorders of intestine: Secondary | ICD-10-CM

## 2015-05-30 DIAGNOSIS — D500 Iron deficiency anemia secondary to blood loss (chronic): Secondary | ICD-10-CM | POA: Insufficient documentation

## 2015-05-30 DIAGNOSIS — E785 Hyperlipidemia, unspecified: Secondary | ICD-10-CM

## 2015-05-30 DIAGNOSIS — I447 Left bundle-branch block, unspecified: Secondary | ICD-10-CM

## 2015-05-30 DIAGNOSIS — I998 Other disorder of circulatory system: Secondary | ICD-10-CM

## 2015-05-30 DIAGNOSIS — I493 Ventricular premature depolarization: Secondary | ICD-10-CM

## 2015-05-30 DIAGNOSIS — E1151 Type 2 diabetes mellitus with diabetic peripheral angiopathy without gangrene: Secondary | ICD-10-CM

## 2015-05-30 DIAGNOSIS — Z7901 Long term (current) use of anticoagulants: Secondary | ICD-10-CM

## 2015-05-30 DIAGNOSIS — I25119 Atherosclerotic heart disease of native coronary artery with unspecified angina pectoris: Secondary | ICD-10-CM | POA: Diagnosis present

## 2015-05-30 DIAGNOSIS — K5289 Other specified noninfective gastroenteritis and colitis: Secondary | ICD-10-CM

## 2015-05-30 DIAGNOSIS — T45515A Adverse effect of anticoagulants, initial encounter: Secondary | ICD-10-CM | POA: Diagnosis not present

## 2015-05-30 DIAGNOSIS — I70229 Atherosclerosis of native arteries of extremities with rest pain, unspecified extremity: Secondary | ICD-10-CM

## 2015-05-30 DIAGNOSIS — Z89519 Acquired absence of unspecified leg below knee: Secondary | ICD-10-CM

## 2015-05-30 DIAGNOSIS — G4733 Obstructive sleep apnea (adult) (pediatric): Secondary | ICD-10-CM | POA: Diagnosis not present

## 2015-05-30 DIAGNOSIS — Z87891 Personal history of nicotine dependence: Secondary | ICD-10-CM | POA: Diagnosis not present

## 2015-05-30 DIAGNOSIS — E7800 Pure hypercholesterolemia, unspecified: Secondary | ICD-10-CM | POA: Diagnosis not present

## 2015-05-30 DIAGNOSIS — K5730 Diverticulosis of large intestine without perforation or abscess without bleeding: Secondary | ICD-10-CM | POA: Diagnosis not present

## 2015-05-30 DIAGNOSIS — I5023 Acute on chronic systolic (congestive) heart failure: Secondary | ICD-10-CM

## 2015-05-30 DIAGNOSIS — R935 Abnormal findings on diagnostic imaging of other abdominal regions, including retroperitoneum: Secondary | ICD-10-CM

## 2015-05-30 DIAGNOSIS — Z720 Tobacco use: Secondary | ICD-10-CM

## 2015-05-30 DIAGNOSIS — T45511A Poisoning by anticoagulants, accidental (unintentional), initial encounter: Secondary | ICD-10-CM | POA: Diagnosis not present

## 2015-05-30 DIAGNOSIS — R1319 Other dysphagia: Secondary | ICD-10-CM

## 2015-05-30 DIAGNOSIS — J441 Chronic obstructive pulmonary disease with (acute) exacerbation: Secondary | ICD-10-CM

## 2015-05-30 DIAGNOSIS — I440 Atrioventricular block, first degree: Secondary | ICD-10-CM

## 2015-05-30 DIAGNOSIS — K921 Melena: Secondary | ICD-10-CM | POA: Diagnosis not present

## 2015-05-30 DIAGNOSIS — I214 Non-ST elevation (NSTEMI) myocardial infarction: Secondary | ICD-10-CM

## 2015-05-30 DIAGNOSIS — I255 Ischemic cardiomyopathy: Secondary | ICD-10-CM

## 2015-05-30 DIAGNOSIS — I110 Hypertensive heart disease with heart failure: Secondary | ICD-10-CM | POA: Diagnosis present

## 2015-05-30 DIAGNOSIS — I200 Unstable angina: Secondary | ICD-10-CM

## 2015-05-30 DIAGNOSIS — I771 Stricture of artery: Secondary | ICD-10-CM

## 2015-05-30 DIAGNOSIS — J440 Chronic obstructive pulmonary disease with acute lower respiratory infection: Secondary | ICD-10-CM

## 2015-05-30 DIAGNOSIS — I739 Peripheral vascular disease, unspecified: Secondary | ICD-10-CM

## 2015-05-30 HISTORY — DX: Gastrointestinal hemorrhage, unspecified: K92.2

## 2015-05-30 HISTORY — DX: Personal history of urinary calculi: Z87.442

## 2015-05-30 HISTORY — DX: Essential (primary) hypertension: I10

## 2015-05-30 HISTORY — DX: Chronic systolic (congestive) heart failure: I50.22

## 2015-05-30 HISTORY — DX: Acute posthemorrhagic anemia: D62

## 2015-05-30 HISTORY — DX: Type 2 diabetes mellitus without complications: E11.9

## 2015-05-30 LAB — CBC WITH DIFFERENTIAL/PLATELET
BASOS PCT: 0 %
Basophils Absolute: 0.1 10*3/uL (ref 0.0–0.1)
EOS PCT: 1 %
Eosinophils Absolute: 0.1 10*3/uL (ref 0.0–0.7)
HCT: 18.6 % — ABNORMAL LOW (ref 39.0–52.0)
HEMOGLOBIN: 5.5 g/dL — AB (ref 13.0–17.0)
LYMPHS ABS: 1.4 10*3/uL (ref 0.7–4.0)
Lymphocytes Relative: 12 %
MCH: 22.3 pg — AB (ref 26.0–34.0)
MCHC: 29.6 g/dL — AB (ref 30.0–36.0)
MCV: 75.3 fL — ABNORMAL LOW (ref 78.0–100.0)
MONOS PCT: 7 %
Monocytes Absolute: 0.9 10*3/uL (ref 0.1–1.0)
NEUTROS PCT: 79 %
Neutro Abs: 9.6 10*3/uL — ABNORMAL HIGH (ref 1.7–7.7)
PLATELETS: 365 10*3/uL (ref 150–400)
RBC: 2.47 MIL/uL — ABNORMAL LOW (ref 4.22–5.81)
RDW: 17.5 % — AB (ref 11.5–15.5)
WBC: 12 10*3/uL — ABNORMAL HIGH (ref 4.0–10.5)

## 2015-05-30 LAB — MRSA PCR SCREENING: MRSA by PCR: NEGATIVE

## 2015-05-30 LAB — COMPREHENSIVE METABOLIC PANEL
ALBUMIN: 3.3 g/dL — AB (ref 3.5–5.0)
ALT: 10 U/L — ABNORMAL LOW (ref 17–63)
AST: 19 U/L (ref 15–41)
Alkaline Phosphatase: 21 U/L — ABNORMAL LOW (ref 38–126)
Anion gap: 6 (ref 5–15)
BUN: 60 mg/dL — ABNORMAL HIGH (ref 6–20)
CALCIUM: 8.3 mg/dL — AB (ref 8.9–10.3)
CHLORIDE: 110 mmol/L (ref 101–111)
CO2: 23 mmol/L (ref 22–32)
Creatinine, Ser: 1.65 mg/dL — ABNORMAL HIGH (ref 0.61–1.24)
GFR calc non Af Amer: 39 mL/min — ABNORMAL LOW (ref >60)
GFR, EST AFRICAN AMERICAN: 46 mL/min — AB (ref >60)
GLUCOSE: 161 mg/dL — AB (ref 65–99)
POTASSIUM: 4.3 mmol/L (ref 3.5–5.1)
SODIUM: 139 mmol/L (ref 135–145)
Total Bilirubin: 0.4 mg/dL (ref 0.3–1.2)
Total Protein: 6.3 g/dL — ABNORMAL LOW (ref 6.5–8.1)

## 2015-05-30 LAB — CBC
HEMATOCRIT: 18.7 % — AB (ref 39.0–52.0)
HEMOGLOBIN: 5.5 g/dL — AB (ref 13.0–17.0)
MCH: 22.2 pg — AB (ref 26.0–34.0)
MCHC: 29.4 g/dL — AB (ref 30.0–36.0)
MCV: 75.4 fL — ABNORMAL LOW (ref 78.0–100.0)
Platelets: 350 10*3/uL (ref 150–400)
RBC: 2.48 MIL/uL — ABNORMAL LOW (ref 4.22–5.81)
RDW: 17.8 % — AB (ref 11.5–15.5)
WBC: 12 10*3/uL — ABNORMAL HIGH (ref 4.0–10.5)

## 2015-05-30 LAB — BASIC METABOLIC PANEL
Anion gap: 5 (ref 5–15)
BUN: 63 mg/dL — AB (ref 6–20)
CALCIUM: 8.3 mg/dL — AB (ref 8.9–10.3)
CO2: 23 mmol/L (ref 22–32)
Chloride: 109 mmol/L (ref 101–111)
Creatinine, Ser: 1.74 mg/dL — ABNORMAL HIGH (ref 0.61–1.24)
GFR calc Af Amer: 43 mL/min — ABNORMAL LOW (ref >60)
GFR, EST NON AFRICAN AMERICAN: 37 mL/min — AB (ref >60)
GLUCOSE: 239 mg/dL — AB (ref 65–99)
POTASSIUM: 4.3 mmol/L (ref 3.5–5.1)
Sodium: 137 mmol/L (ref 135–145)

## 2015-05-30 LAB — PREPARE RBC (CROSSMATCH)

## 2015-05-30 LAB — PROTIME-INR
INR: 5.68 — AB (ref 0.00–1.49)
PROTHROMBIN TIME: 48.9 s — AB (ref 11.6–15.2)

## 2015-05-30 LAB — TROPONIN I
Troponin I: 0.03 ng/mL (ref <0.031)
Troponin I: 0.03 ng/mL (ref <0.031)

## 2015-05-30 LAB — I-STAT CG4 LACTIC ACID, ED
LACTIC ACID, VENOUS: 1.97 mmol/L (ref 0.5–2.0)
LACTIC ACID, VENOUS: 1.35 mmol/L (ref 0.5–2.0)

## 2015-05-30 LAB — IRON AND TIBC
Iron: 23 ug/dL — ABNORMAL LOW (ref 45–182)
SATURATION RATIOS: 4 % — AB (ref 17.9–39.5)
TIBC: 634 ug/dL — AB (ref 250–450)
UIBC: 611 ug/dL

## 2015-05-30 LAB — GLUCOSE, CAPILLARY: Glucose-Capillary: 179 mg/dL — ABNORMAL HIGH (ref 65–99)

## 2015-05-30 LAB — I-STAT TROPONIN, ED: TROPONIN I, POC: 0.03 ng/mL (ref 0.00–0.08)

## 2015-05-30 MED ORDER — INSULIN ASPART 100 UNIT/ML ~~LOC~~ SOLN
0.0000 [IU] | Freq: Three times a day (TID) | SUBCUTANEOUS | Status: DC
  Administered 2015-05-31 – 2015-06-02 (×5): 2 [IU] via SUBCUTANEOUS

## 2015-05-30 MED ORDER — ACETAMINOPHEN 650 MG RE SUPP
650.0000 mg | Freq: Four times a day (QID) | RECTAL | Status: DC | PRN
Start: 2015-05-30 — End: 2015-06-02

## 2015-05-30 MED ORDER — OXYCODONE-ACETAMINOPHEN 5-325 MG PO TABS
2.0000 | ORAL_TABLET | Freq: Two times a day (BID) | ORAL | Status: DC | PRN
Start: 2015-05-30 — End: 2015-06-02
  Administered 2015-06-01: 2 via ORAL
  Filled 2015-05-30: qty 2

## 2015-05-30 MED ORDER — SODIUM CHLORIDE 0.9 % IV SOLN
INTRAVENOUS | Status: DC
  Administered 2015-05-30: 250 mL via INTRAVENOUS

## 2015-05-30 MED ORDER — SODIUM CHLORIDE 0.9 % IV BOLUS (SEPSIS)
500.0000 mL | Freq: Once | INTRAVENOUS | Status: AC
  Administered 2015-05-30: 500 mL via INTRAVENOUS

## 2015-05-30 MED ORDER — ACETAMINOPHEN 325 MG PO TABS: 650.0000 mg | ORAL_TABLET | Freq: Four times a day (QID) | ORAL | Status: DC | PRN

## 2015-05-30 MED ORDER — VITAMIN K1 10 MG/ML IJ SOLN
5.0000 mg | Freq: Once | INTRAVENOUS | Status: AC
  Administered 2015-05-30: 5 mg via INTRAVENOUS
  Filled 2015-05-30: qty 0.5

## 2015-05-30 MED ORDER — SODIUM CHLORIDE 0.9 % IV SOLN: 10.0000 mL/h | Freq: Once | INTRAVENOUS | Status: DC

## 2015-05-30 MED ORDER — SODIUM CHLORIDE 0.9 % IV SOLN
Freq: Once | INTRAVENOUS | Status: AC
  Administered 2015-05-30: 18:00:00 via INTRAVENOUS

## 2015-05-30 MED ORDER — ONDANSETRON HCL 4 MG PO TABS: 4.0000 mg | ORAL_TABLET | Freq: Four times a day (QID) | ORAL | Status: DC | PRN

## 2015-05-30 MED ORDER — INSULIN ASPART 100 UNIT/ML ~~LOC~~ SOLN: 0.0000 [IU] | Freq: Every day | SUBCUTANEOUS | Status: DC

## 2015-05-30 MED ORDER — ONDANSETRON HCL 4 MG/2ML IJ SOLN
4.0000 mg | Freq: Four times a day (QID) | INTRAMUSCULAR | Status: DC | PRN
  Administered 2015-05-31 (×2): 4 mg via INTRAVENOUS
  Filled 2015-05-30 (×2): qty 2

## 2015-05-30 MED ORDER — PREGABALIN 50 MG PO CAPS
50.0000 mg | ORAL_CAPSULE | Freq: Two times a day (BID) | ORAL | Status: DC
  Administered 2015-05-30 – 2015-06-02 (×6): 50 mg via ORAL
  Filled 2015-05-30: qty 1
  Filled 2015-05-30 (×4): qty 2
  Filled 2015-05-30: qty 1

## 2015-05-30 MED ORDER — SODIUM CHLORIDE 0.9 % IV SOLN
510.0000 mg | Freq: Once | INTRAVENOUS | Status: AC
  Administered 2015-05-30: 510 mg via INTRAVENOUS
  Filled 2015-05-30: qty 17

## 2015-05-30 NOTE — ED Notes (Signed)
Patient arrives from Day Surgery with c/o abnormal EKG taken at 1059. Patient c/o chest pressure this morning,but no current pain. Patient alert.

## 2015-05-30 NOTE — Telephone Encounter (Signed)
Attempted to call and had to leave message.

## 2015-05-30 NOTE — Progress Notes (Signed)
Awake. Talking. Denies chest pain. O2 started with non-rebreather at 10 l/m. Connected to portable monitor. Daughter and nephew at side.

## 2015-05-30 NOTE — ED Notes (Signed)
Gave pt gingerale per request.

## 2015-05-30 NOTE — Progress Notes (Signed)
EKG obtained. Results display STEMI.

## 2015-05-30 NOTE — Telephone Encounter (Signed)
Her name is  Museum/gallery conservator and her number is [phone]

## 2015-05-30 NOTE — Telephone Encounter (Signed)
Amber returned call. Per [employer], patient has been taking Plavix and coumadin (it must be noted this has been a chronic thing). He was supposed to hold Plavix after seeing Dr. Earleen Newport end of September, when he had reported isolated incidence of black stool. Daughter was unaware of changes in Plavix and patient appears to be confused as well. Daughter states and patient confirms that there has been no other signs of black stool since end of September. He has been unable to tolerate oral iron therapy, so he was set up for IV iron infusion as outpatient. With the change in status at time of seeing Dr. Earleen Newport, decision was made to proceed with updated colonoscopy (last in 2012) +/- capsule study.RECENT EGD ON FILE from hospitalization this summer, when he had melena and acute blood loss anemia. Complicated scenario, as he has multiple comorbidities, requires anticoagulation,  has dealt with IDA chronically and has likely had a stuttering, occult GI bleed. Appears he presented for his pre-op and had hgb 5.5 and markedly abnormal EKG. In the future, recommend a family member present with him at outpatient appts to eliminate confusion. He will be triaged in the ED for cardiac issues and we will follow as appropriate while inpatient if he remained at [facility].

## 2015-05-30 NOTE — Consult Note (Signed)
CARDIOLOGY CONSULT NOTE   Patient ID: Tanner Pena MRN: [id number] DOB/AGE: [birth year] 74 y.o.  Admit date: 05/30/2015  Primary Physician   [name], MD Primary Cardiologist  Dr [name], Dr [name] ([place]) Reason for Consultation  Chest pain in setting of anemia 2nd GI bleed on anticoagulation w/ hx AVR  [id number] Tanner Pena is a 74 y.o. year old male with a history of 6v CABG in 1990 with St. 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   Pt had been on Plavix and coumadin. Plavix was d/c'd 04/2015 after black stool. Pt w/ hx melena and iron-def anemia, felt to have occult GIB. He has been in the process of further GI evaluation per Dr. [name] with planned colonoscopy.   Was to have IV Iron infused today as OP. Told staff he had chest pain at home and on the way to the hospital and was sent to ER. H&H 5.5/18/6, INR 5.86. Had grossly bloody stool after arrival in ER.  Mr. [name] states this was the first episode of chest pain he's had in a very long time. 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 He states that he has been having intermittent dark stools for several weeks. He has been getting lightheaded at times, orthostatic in nature, but has not had syncope. He states his Coumadin is followed by the [facility] office, but I do not see a recent INR check at the office since 08/25.  When Mr. [name] had the chest pain, he was in his wheelchair, getting ready to come for his iron infusion, when he developed the chest pain. He did not try any medications for it. He cannot remember if it was like his previous angina or not, but thinks it might of been. His symptoms resolved, without intervention. The pain was mild and he is unable to rate it exactly. It was not associated  with any symptoms such as shortness of breath, nausea or diaphoresis. It did not radiate. He is currently resting comfortably.   Past Medical History  Diagnosis Date  . Type 2 diabetes mellitus ([name]) 2007  . Essential hypertension   . Gout   . Hypercholesteremia   . Peripheral vascular disease ([name])     Lower extremity PCI/stenting  . COPD (chronic obstructive pulmonary disease) ([name])   . S/P aortic valve replacement 1990    a. St. Jude  . Chronic back pain   . Dysphagia   . Neuromuscular disorder ([place])   . Peripheral neuropathy ([place])   . Critical lower limb ischemia   . CAD (coronary artery disease)     a. 05/13/14 [name] s/p overlapping DESx2 to SVG to RCA. b.  s/p CABG '90 with redo '94 & stent to RCA SVG in 2005  . Chronic toe ulcer ([place])     a. Left foot  . Chronic systolic heart failure ([name])   . History of kidney stones   . GERD (gastroesophageal reflux disease)   . Arthritis   . Sleep apnea   . Myocardial infarction [facility])      Past Surgical History  Procedure Laterality Date  . Aortic valve replacement  1990    St. Jude  . Rotator cuff repair      right  .  Cataract extraction      bilateral  . Coronary stent placement  2005    RCA vein graft A 3.0x13.0 TAXUS stent was then placed int he vessel a Viva 3.0x4.0 (perfusion balloon was made ready it was placed through the entire lenght of the stent  . Peripheral vascular procedures lower extremities      right external iliac  artery PTA and stenting as well as bilateral SFA intervention remotely. Repeat procedures in 2011 bilaterally  . Coronary artery bypass graft  1994    6 vessels  . [name]  06/13/2011    Procedure: Venia Minks DILATION;  Surgeon: [name], MD;  Location: [facility];  Service: Endoscopy;  Laterality: N/A;  Dilated to 56.   . Angioplasty illiac artery    . Back surgery  3825,0539    2  . Eye surgery    . Amputation Left 07/13/2014    Procedure: Transmetatarsal Amputation;  Surgeon: [name], MD;  Location: [place];  Service: Orthopedics;  Laterality: Left;  . Lower extremity angiogram N/A 02/15/2013    Procedure: LOWER EXTREMITY ANGIOGRAM;  Surgeon: [name], MD;  Location: [facility] CATH LAB;  Service: Cardiovascular;  Laterality: N/A;  . Left heart catheterization with coronary angiogram N/A 05/11/2014    Procedure: LEFT HEART CATHETERIZATION WITH CORONARY ANGIOGRAM;  Surgeon: [name], MD;  Location: [facility] CATH LAB;  Service: Cardiovascular;  Laterality: N/A;  . Percutaneous coronary stent intervention (pci-s) N/A 05/13/2014    Procedure: PERCUTANEOUS CORONARY STENT INTERVENTION (PCI-S);  Surgeon: [name], MD;  Location: [facility] CATH LAB;  Service: Cardiovascular;  Laterality: N/A;  . Lower extremity angiogram N/A 06/06/2014    Procedure: LOWER EXTREMITY ANGIOGRAM;  Surgeon: [name], MD;  [facility] C[facility] LAB;  Service: Cardiovascular;  Laterality: N/A;  . Amputation Left 08/20/2014    Procedure: Revision Transmetatarsal Amputation versus Below Knee Amputation;  Surgeon: [name], MD;  Location: [place];  Service: Orthopedics;  Laterality: Left;  . Stump revision Left 09/23/2014    Procedure: Revision Left Below Knee Amputation;  Surgeon: [name], MD;  Location: [place];  Service: Orthopedics;  Laterality: Left;  . Stump revision Left 10/13/2014    Procedure: REVISION LEFT BELOW KNEE AMPUTATION STUMP;  Surgeon: [name], MD;  Location: [facility];  Service: Orthopedics;  Laterality: Left;  . Esophagogastroduodenoscopy N/A 02/09/2015    DR. Schooler: Normal EGD  . Esophagogastroduodenoscopy  05/2011    Dr. [name]: s/p esophageal dilation, antral erosions/nodularity with benign biopsies  . Colonoscopy  05/2011    Dr. [name]: benign rectal polyp, left sided tics, ascending colonic ulcers (path c/w ischemia)    No Known Allergies  I have reviewed the patient's current medications . sodium chloride  10 mL/hr Intravenous Once        Medication Sig  ACCU-CHEK AVIVA PLUS test strip   albuterol (PROVENTIL) 4 MG tablet Take 4 mg by mouth 3 (three) times daily.  albuterol-ipratropium (COMBIVENT) 18-103 MCG/ACT inhaler Inhale 1 puff into the lungs 4 (four) times daily. Coughing/ Shortness of Breath  allopurinol (ZYLOPRIM) 300 MG tablet Take 300 mg by mouth at bedtime.   aspirin EC 81 MG EC tablet Take 1 tablet (81 mg total) by mouth daily.  cholecalciferol (VITAMIN D) 1000 UNITS tablet Take 2,000 Units by mouth daily.  clopidogrel (PLAVIX) 75 MG tablet Take 1 tablet (75 mg total) by mouth daily. <PLEASE MAKE APPOINTMENT FOR REFILLS>  Emollient (EUCERIN) lotion Apply  10 mLs topically as needed for dry skin.  fenofibrate (TRICOR) 145 MG tablet Take 1 tablet (145 mg total) by mouth daily. NEED OV.  fish oil-omega-3 fatty acids 1000 MG capsule Take 1 capsule (1 g total) by mouth 2 (two) times daily.  furosemide (LASIX) 40 MG tablet Take 1 tablet (40 mg total) by mouth daily. Patient taking differently: Take 80 mg by mouth daily.   glimepiride (AMARYL) 1 MG tablet Take 1 mg by mouth 2 (two) times daily.  isosorbide mononitrate (IMDUR) 30 MG 24 hr tablet Take 1 tablet (30 mg total) by mouth daily. <PLEASE MAKE APPOINTMENT FOR REFILLS>  metoprolol succinate (TOPROL XL) 50 MG 24 hr tablet Take 50 mg by mouth daily. Take with or immediately following a meal.  nitroGLYCERIN (NITROSTAT) 0.4 MG SL tablet Place 1 tablet (0.4 mg total) under the tongue every 5 (five) minutes as needed for chest pain.  oxyCODONE-acetaminophen (PERCOCET) 10-325 MG per tablet Take 1 tablet by mouth every 4 (four) hours as needed for pain. Patient taking differently: Take 1 tablet by mouth 2 (two) times daily as needed for pain.   polyethylene glycol-electrolytes (NULYTELY/GOLYTELY) 420 G  Take 4,000 mLs by mouth once.  potassium chloride SA (K-DUR,KLOR-CON) 20 MEQ tablet Take 20 mEq by mouth daily.    pregabalin (LYRICA) 50 MG capsule Take one capsule by  mouth twice daily for pains  ramipril (ALTACE) 2.5 MG capsule Take 2.5 mg by mouth at bedtime.   ramipril (ALTACE) 5 MG capsule Take 5 mg by mouth every morning.  rosuvastatin (CRESTOR) 20 MG  TAKE (1) TABLET BY MOUTH AT BEDTIME  sitaGLIPtin (JANUVIA) 50 MG tablet Take 50 mg by mouth daily.  vitamin C (ASCORBIC ACID) 500 MG  Take 500 mg by mouth daily.  warfarin (COUMADIN) 5 MG tablet Take 1 tablet by mouth daily or as directed by coumadin clinic.     Social History   Social History  . Marital Status: Legally Separated    Spouse Name: N/A  . Number of Children: 5  . Years of Education: N/A   Occupational History  . retired     Stage manager   Social History Main Topics  . Smoking status: Former Smoker -- 1.00 packs/day for 55 years    Types: Cigarettes    Start date: 08/19/1953    Quit date: 05/07/2014  . Smokeless tobacco: Current User    Types: Chew    Last Attempt to Quit: 08/20/1995     Comment: Has quit on 3 occasions. Counseling given today 5-10 minutes   I am more than likely going to quit "  . Alcohol Use: No     Comment: Socially, sometimes 12 ounce beer daily, may go month without/no whiskey  . Drug Use: No  . Sexual Activity: Not on file   Other Topics Concern  . Not on file   Social History Narrative   Has 3 daughters   Has 2 sons    Family Status  Relation Status Death Age  . Mother Deceased   . Father Deceased   . Daughter Alive   . Daughter Alive   . Daughter Alive   . Son Alive   . Son Alive    Family History  Problem Relation Age of Onset  . Colon cancer Neg Hx   . Liver disease Neg Hx      ROS:  Full 14 point review of systems complete and found to be negative unless listed above.  Physical Exam: Blood  pressure 125/34, pulse 80, temperature 97.9 F (36.6 C), temperature source Oral, resp. rate 23, height '5\' 10"'$  (1.778 m), weight 202 lb 2.6 oz (91.7 kg), SpO2 100 %.  General: Well developed, well nourished, male in no acute  distress Head: Eyes PERRLA, No xanthomas.   Normocephalic and atraumatic, oropharynx without edema or exudate. Dentition: poor Lungs: few rales, good air exchange Heart: HRRR S1 S2, no rub/gallop, 2/6 murmur, crisp valve click. pulses are 2+ both upper and right lowerextrem.   Neck: No carotid bruits. No lymphadenopathy.  JVDnot elevated. Abdomen: Bowel sounds present, abdomen soft and non-tender without masses or hernias noted. Msk:  No spine or cva tenderness. No weakness, no joint deformities or effusions. S/p L BKA Extremities: No clubbing or cyanosis. no edema.  Neuro: Alert and oriented X 3. No focal deficits noted. Psych:  Good affect, responds appropriately Skin: No rashes or lesions noted.  Labs:   Lab Results  Component Value Date   WBC 12.0* 05/30/2015   HGB 5.5* 05/30/2015   HCT 18.6* 05/30/2015   MCV 75.3* 05/30/2015   PLT 365 05/30/2015    Recent Labs  05/30/15 1250  INR 5.68*     Recent Labs Lab 05/30/15 1158  NA 139  K 4.3  CL 110  CO2 23  BUN 60*  CREATININE 1.65*  CALCIUM 8.3*  PROT 6.3*  BILITOT 0.4  ALKPHOS 21*  ALT 10*  AST 19  GLUCOSE 161*  ALBUMIN 3.3*    Recent Labs  05/30/15 1158  TROPONINI 0.03    Recent Labs  05/30/15 1230  TROPIPOC 0.03   B NATRIURETIC PEPTIDE  Date/Time Value Ref Range Status  02/04/2015 04:13 AM 593.2* 0.0 - 100.0 pg/mL Final  12/28/2014 01:34 AM 504.3* 0.0 - 100.0 pg/mL Final   Lab Results  Component Value Date   CHOL 123 05/09/2014   HDL 29* 05/09/2014   LDLCALC 62 05/09/2014   TRIG 161* 05/09/2014   Lab Results  Component Value Date   DDIMER <0.27 01/19/2014   No results found for: LIPASE, AMYLASE TSH  Date/Time Value Ref Range Status  12/28/2014 06:22 AM 0.932 0.350 - 4.500 uIU/mL Final   VITAMIN B-12  Date/Time Value Ref Range Status  02/04/2015 10:33 AM 264 180 - 914 pg/mL Final    Comment:    (NOTE) This assay is not validated for testing neonatal or myeloproliferative syndrome  specimens for Vitamin B12 levels.    FOLATE  Date/Time Value Ref Range Status  02/04/2015 10:33 AM 12.5 >5.9 ng/mL Final   FERRITIN  Date/Time Value Ref Range Status  04/06/2015 11:04 AM 10* 22 - 322 ng/mL Final   TIBC  Date/Time Value Ref Range Status  05/30/2015 12:50 PM 634* 250 - 450 ug/dL Final   IRON  Date/Time Value Ref Range Status  05/30/2015 12:50 PM 23* 45 - 182 ug/dL Final   RETIC CT PCT  Date/Time Value Ref Range Status  02/04/2015 10:33 AM 2.6 0.4 - 3.1 % Final    Echo: 12/2014 - Left ventricle: Inferior, septal and apical hypokinesis. The cavity size was mildly dilated. Wall thickness was increased in a pattern of moderate LVH. The estimated ejection fraction was 40%. - Aortic valve: Mechanical AVR with no periporsthetic regurgitation. Gradients elevated but actually lower than 2015 when peak velocity was 3.5 m/sec and peak gradient was 49 mmHg. Valve area (VTI): 0.88 cm^2. Valve area (Vmax): 0.63 cm^2. Valve area (Vmean): 0.94 cm^2. - Left atrium: The atrium was moderately dilated. - Atrial  septum: There was increased thickness of the septum, consistent with lipomatous hypertrophy.  ECG:  05/30/2015 SR, Ventricular trigeminy  Radiology:  Dg Chest Portable 1 View  05/30/2015   CLINICAL DATA:  EKG changes, hypertension, diabetes  EXAM: PORTABLE CHEST 1 VIEW  COMPARISON:  02/04/2015  FINDINGS: Mild right basilar airspace disease which may reflect scarring versus atelectasis. There is no other focal parenchymal opacity. There is no pleural effusion or pneumothorax. There is stable cardiomegaly. There is evidence of prior CABG.  The osseous structures are unremarkable.  IMPRESSION: 1. Mild right basilar airspace disease which may reflect scarring versus atelectasis.   Electronically Signed   By: Kathreen Devoid   On: 05/30/2015 12:12    ASSESSMENT AND PLAN:   The patient was seen today by Dr Domenic Polite, the patient evaluated and the data reviewed.   Principal Problem:   GI (gastrointestinal bleed) - management per GI/IM  Active Problems:   Acute blood loss anemia - Being transfused, per IM    DM2 (diabetes mellitus, type 2) ([place]) - Per IM    Warfarin-induced coagulopathy ([place]) - Per IM, has had Vit K    Hx Mech AoV - difficult situation, but he needs evaluation of his anemia at this time. - His Coumadin is on hold and he has received vitamin K per ER staff - M.D. To discuss resumption of anticoagulation with GI/IM once his GI bleed has been evaluated - For now, OK to be off all anticoagulants - M.D. Advise if echo needed, previous echo results above    Chest pain - we'll cycle enzymes, but no ischemic workup planned. - The chest pain occurred in the setting of a hemoglobin of 5.5. - He has not been having other episodes of chest pain and his cardiac status is otherwise stable.  SignedRosaria Ferries, PA-C  05/30/2015 5:17 PM Beeper [phone]  Co-Sign MD  Attending note:  Patient seen and examined. Reviewed extensive records and discussed the case with Ms. Ahmed Prima PA-C. Mr. Melott is a medically complex patient of Dr. [name] with cardiac history outlined above. He has a history of iron deficiency anemia and suspected slow GI bleed, in the process of undergoing further evaluation by Dr. Sydell Axon with plan for colonoscopy. His hemoglobin in July was down to 7.9. He presented today for iron infusion, mentioned that he had had recent chest discomfort and was referred to the ER for further evaluation. He states that he has been having intermittent dark stools for the last several weeks, no hematochezia. Also reports intermittent abdominal discomfort and cramping as well as nausea. ECG reviewed showing sinus rhythm with old left bundle branch block and PVCs. Initial troponin I normal, hemoglobin down to 5.5. INR was supratherapeutic at 5.6 and the patient was given 5 mg of vitamin K by ER staff with plan to hold Coumadin  temporarily. He is now admitted to the hospitalist team for further management and GI evaluation. He does not report any progressing chest pain symptoms, noted the episode just recently. Otherwise has been stable from a cardiac perspective, last intervention was in September 2015 with overlapping DES to the SVG to RCA. He had been on Plavix and Coumadin up until this September. On examination he is in no distress, lungs exhibit diminished breath sounds, cardiac exam reveals RRR with ectopic beats, mechanical prosthetic click noted and S2 with soft systolic murmur and no diastolic murmur. Abdomen reveals normal bowel sounds, extremities show no pitting edema. We are consulted to follow along and  assist with his management. At this point would agree with temporarily holding Coumadin to facilitate further GI workup and colonoscopy. With mechanical prosthesis in the aortic position, he should be able to stay off anticoagulation for several days if needed. Cycle cardiac markers, but at this point doubt ACS. He could be having some angina symptoms however in the face of severe anemia. He did have a recent echocardiogram in May of this year showing LVEF 40% with overall stable aortic valve function.  Satira Sark, M.D., F.A.C.C.

## 2015-05-30 NOTE — Progress Notes (Signed)
States that while on his way to hospital he experienced some dull pressure on left side of chest. Points to left side to chest. Stated that after he burped 2 times it disappeared. Denies chest pain at this time. Denies shortness of breath. Skin warm and dry. Informed pt we would be getting an EKG for his PAT interview. Voiced understanding.

## 2015-05-30 NOTE — Progress Notes (Signed)
Talked with Tiffany in ER. Pt report given. Voiced understanding.

## 2015-05-30 NOTE — Consult Note (Signed)
Referring Provider: [facility] Primary Care Physician:  [name], MD Primary Gastroenterologist:  Dr. [name]   Date of Admission: 05/30/15 Date of Consultation: 05/30/15 Reason for Consultation:  Acute blood loss anemia  HPI:  Tanner Pena is a 74 y.o. year old male with a history of multiple comorbidities to include DM, HTN, PVD/CAD, prosthetic heart valve, and inpatient July 2016 with abdominal pain secondary to mesenteric ischemia. Underwent stenting of proximal SMA stenosis on March 06, 2015. Also history notable for IDA, ischemic colonic ulcers on last colonoscopy in 2012. EGD June 2016 with normal EGD by Dr. Michail Sermon. He was actually scheduled for a colonoscopy in a few days due to reports of melena, but during his pre-op evaluation today he reported recent chest pain. CBC drawn noting Hgb 5.5. EKG abnormal. He was sent to the ED.    He has historically had Hgb trending in the 7, 8 range. He has been unable to tolerate oral iron therapy, and IV Feraheme was arranged for him to receive as outpatient today; however, this was not completed due to change in clinical status. Patient is a poor historian, and it appears there has been some confusion regarding medications. Patient saw Dr. Earleen Newport in follow-up for SMA stent 9/29, and he was advised to stop plavix and only take Coumadin and 81 mg aspirin due to reported possible melena prior to that visit. The patient was confused about this and has been taking both medications. Dr. Earleen Newport called our office and spoke with myself, as I had recently seen the patient. At that time, Tanner Pena denied any overt GI bleeding. As he had a change in clinical status but isolated incidence of melena, it was decided to pursue a colonoscopy and consider a capsule study, as colonoscopy had last been completed in 2012 and recent EGD was on file. He was informed to seek medical attention if any further black stool. He tells me today that he has not seen any  overt GI bleeding since that time. He complains of vague lower abdominal discomfort intermittently without any exacerbating or relieving factors. He notes dry heaves starting over past few months, but this was never mentioned to me in his office visits. He feels his "age is catching up" to him. Chest pain has resolved. Reports vague esophageal dysphagia but wears dentures. States his bottom dentures do not fit properly.   Labs repeated in the ED with Hgb remaining at 5.5, normal troponin X 1 (which is being cycled), elevated Cr from baseline at 1.65 and BUN 60, INR 5.68. Appears he has not had his INR checked in about a month. 1 unit of blood has been ordered for transfusion.   Past Medical History  Diagnosis Date  . Diabetes mellitus 2007  . Hypertension   . Gout   . Hypercholesteremia   . Peripheral vascular disease ([place])     stents lower extremity  . COPD (chronic obstructive pulmonary disease) ([place])   . S/P aortic valve replacement 1990    a. [employer]  . Chronic back pain   . Dysphagia   . Neuromuscular disorder ([place])   . Peripheral neuropathy ([place])   . Critical lower limb ischemia   . CAD (coronary artery disease)     a. 05/13/14 [name] s/p overlapping DESx2 to SVG to RCA. b.  s/p CABG '90 with redo '94 & stent to RCA SVG in 2005  . Chronic toe ulcer ([place])     a. left foot  .  Shortness of breath   . CHF (congestive heart failure) ([place])   . Stone in kidney   . GERD (gastroesophageal reflux disease)   . Arthritis   . Sleep apnea     tested greater than 7 years ago per patient  . Myocardial infarction [facility])     not sure when had MI.    Past Surgical History  Procedure Laterality Date  . Open heart surgery  1990    prosthetic heart valve, one bypass  . Rotator cuff repair      right  . Cataract extraction      bilateral  . Coronary stent placement  2005    RCA vein graft A 3.0x13.0 TAXUS stent was then placed int he vessel a Viva 3.0x4.0 (perfusion balloon was made ready it  was placed through the entire lenght of the stent  . Peripheral vascular procedures lower extremities      right external iliac  artery PTA and stenting as well as bilateral SFA intervention remotely. Repeat procedures in 2011 bilaterally  . Coronary artery bypass graft  1994    6 vessels  . [name]  06/13/2011    Procedure: Venia Minks DILATION;  Surgeon: [name], MD;  Location: [facility];  Service: Endoscopy;  Laterality: N/A;  Dilated to 56.   . Angioplasty illiac artery    . Back surgery  3785,[id number]    2  . Eye surgery    . Amputation Left 07/13/2014    Procedure: Transmetatarsal Amputation;  Surgeon: [name], MD;  Location: [place];  Service: Orthopedics;  Laterality: Left;  . Lower extremity angiogram N/A 02/15/2013    Procedure: LOWER EXTREMITY ANGIOGRAM;  Surgeon: [name], MD;  Location: [facility] CATH LAB;  Service: Cardiovascular;  Laterality: N/A;  . Left heart catheterization with coronary angiogram N/A 05/11/2014    Procedure: LEFT HEART CATHETERIZATION WITH CORONARY ANGIOGRAM;  Surgeon: [name], MD;  Location: [facility] CATH LAB;  Service: Cardiovascular;  Laterality: N/A;  . Percutaneous coronary stent intervention (pci-s) N/A 05/13/2014    Procedure: PERCUTANEOUS CORONARY STENT INTERVENTION (PCI-S);  Surgeon: [name], MD;  Location: [facility] CATH LAB;  Service: Cardiovascular;  Laterality: N/A;  . Lower extremity angiogram N/A 06/06/2014    Procedure: LOWER EXTREMITY ANGIOGRAM;  Surgeon: [name], MD;  Location: [facility] CATH LAB;  Service: Cardiovascular;  Laterality: N/A;  . Amputation Left 08/20/2014    Procedure: Revision Transmetatarsal Amputation versus Below Knee Amputation;  Surgeon: [name], MD;  Location: [place];  Service: Orthopedics;  Laterality: Left;  . Stump revision Left 09/23/2014    Procedure: Revision Left Below Knee Amputation;  Surgeon: [name], MD;  Location: [place];  Service: Orthopedics;  Laterality: Left;  . Stump  revision Left 10/13/2014    Procedure: REVISION LEFT BELOW KNEE AMPUTATION STUMP;  Surgeon: [name], MD;  Location: [facility];  Service: Orthopedics;  Laterality: Left;  . Esophagogastroduodenoscopy N/A 02/09/2015    DR. Schooler: Normal EGD  . Esophagogastroduodenoscopy  05/2011    Dr. [name]: s/p esophageal dilation, antral erosions/nodularity with benign biopsies  . Colonoscopy  05/2011    Dr. [name]: benign rectal polyp, left sided tics, ascending colonic ulcers (path c/w ischemia)    Prior to Admission medications   Medication Sig Start Date End Date Taking? Authorizing Provider  ACCU-CHEK AVIVA PLUS test strip  12/22/14   Historical Provider, MD  albuterol (PROVENTIL) 4 MG tablet Take 4 mg by mouth 3 (  three) times daily. 01/20/15   Historical Provider, MD  albuterol-ipratropium (COMBIVENT) 18-103 MCG/ACT inhaler Inhale 1 puff into the lungs 4 (four) times daily. Coughing/ Shortness of Breath    Historical Provider, MD  allopurinol (ZYLOPRIM) 300 MG tablet Take 300 mg by mouth at bedtime.     Historical Provider, MD  aspirin EC 81 MG EC tablet Take 1 tablet (81 mg total) by mouth daily. 03/11/15   Kelvin Cellar, MD  cholecalciferol (VITAMIN D) 1000 UNITS tablet Take 2,000 Units by mouth daily.    Historical Provider, MD  clopidogrel (PLAVIX) 75 MG tablet Take 1 tablet (75 mg total) by mouth daily. <PLEASE MAKE APPOINTMENT FOR REFILLS> 05/22/15   [name], MD  Emollient (EUCERIN) lotion Apply 10 mLs topically as needed for dry skin.    Historical Provider, MD  fenofibrate (TRICOR) 145 MG tablet Take 1 tablet (145 mg total) by mouth daily. NEED OV. 05/19/15   [name], MD  fish oil-omega-3 fatty acids 1000 MG capsule Take 1 capsule (1 g total) by mouth 2 (two) times daily. 01/22/13   Casimiro Needle Alvstad, RPH-CPP  furosemide (LASIX) 40 MG tablet Take 1 tablet (40 mg total) by mouth daily. Patient taking differently: Take 80 mg by mouth daily.  03/11/15   Kelvin Cellar, MD   glimepiride (AMARYL) 1 MG tablet Take 1 mg by mouth 2 (two) times daily.    Historical Provider, MD  isosorbide mononitrate (IMDUR) 30 MG 24 hr tablet Take 1 tablet (30 mg total) by mouth daily. <PLEASE MAKE APPOINTMENT FOR REFILLS> 05/19/15   [name], MD  metoprolol succinate (TOPROL XL) 50 MG 24 hr tablet Take 50 mg by mouth daily. Take with or immediately following a meal.    Historical Provider, MD  nitroGLYCERIN (NITROSTAT) 0.4 MG SL tablet Place 1 tablet (0.4 mg total) under the tongue every 5 (five) minutes as needed for chest pain. 05/17/14   Eileen Stanford, PA-C  oxyCODONE-acetaminophen (PERCOCET) 10-325 MG per tablet Take 1 tablet by mouth every 4 (four) hours as needed for pain. Patient taking differently: Take 1 tablet by mouth 2 (two) times daily as needed for pain.  09/23/14   [name], MD  polyethylene glycol-electrolytes (NULYTELY/GOLYTELY) 420 G solution Take 4,000 mLs by mouth once. 05/18/15   Orvil Feil, NP  potassium chloride SA (K-DUR,KLOR-CON) 20 MEQ tablet Take 20 mEq by mouth daily.      Historical Provider, MD  pregabalin (LYRICA) 50 MG capsule Take one capsule by mouth twice daily for pains 08/23/14   Blanchie Serve, MD  ramipril (ALTACE) 2.5 MG capsule Take 2.5 mg by mouth at bedtime.  02/17/15   Historical Provider, MD  ramipril (ALTACE) 5 MG capsule Take 5 mg by mouth every morning.    Historical Provider, MD  rosuvastatin (CRESTOR) 20 MG tablet TAKE (1) TABLET BY MOUTH AT BEDTIME FOR CHOLESTEROL. 05/19/15   [name], MD  sitaGLIPtin (JANUVIA) 50 MG tablet Take 50 mg by mouth daily.    Historical Provider, MD  vitamin C (ASCORBIC ACID) 500 MG tablet Take 500 mg by mouth daily.    Historical Provider, MD  warfarin (COUMADIN) 5 MG tablet Take 1 tablet by mouth daily or as directed by coumadin clinic. 05/25/15   [name], MD    Current Facility-Administered Medications  Medication Dose Route Frequency Provider Last Rate Last Dose  . 0.9 %  sodium  chloride infusion  10 mL/hr Intravenous Once Courteney Lyn  Mackuen, MD      . phytonadione (VITAMIN K) 5 mg in dextrose 5 % 50 mL IVPB  5 mg Intravenous Once Courteney Lyn Mackuen, MD 50 mL/hr at 05/30/15 1425 5 mg at 05/30/15 1425   Current Outpatient Prescriptions  Medication Sig Dispense Refill  . ACCU-CHEK AVIVA PLUS test strip     . albuterol (PROVENTIL) 4 MG tablet Take 4 mg by mouth 3 (three) times daily.    [name] albuterol-ipratropium (COMBIVENT) 18-103 MCG/ACT inhaler Inhale 1 puff into the lungs 4 (four) times daily. Coughing/ Shortness of Breath    . allopurinol (ZYLOPRIM) 300 MG tablet Take 300 mg by mouth at bedtime.     [name] aspirin EC 81 MG EC tablet Take 1 tablet (81 mg total) by mouth daily. 30 tablet 0  . cholecalciferol (VITAMIN D) 1000 UNITS tablet Take 2,000 Units by mouth daily.    . clopidogrel (PLAVIX) 75 MG tablet Take 1 tablet (75 mg total) by mouth daily. <PLEASE MAKE APPOINTMENT FOR REFILLS> 30 tablet 0  . Emollient (EUCERIN) lotion Apply 10 mLs topically as needed for dry skin.    . fenofibrate (TRICOR) 145 MG tablet Take 1 tablet (145 mg total) by mouth daily. NEED OV. 30 tablet 0  . fish oil-omega-3 fatty acids 1000 MG capsule Take 1 capsule (1 g total) by mouth 2 (two) times daily.    . furosemide (LASIX) 40 MG tablet Take 1 tablet (40 mg total) by mouth daily. (Patient taking differently: Take 80 mg by mouth daily. ) 30 tablet 1  . glimepiride (AMARYL) 1 MG tablet Take 1 mg by mouth 2 (two) times daily.    . isosorbide mononitrate (IMDUR) 30 MG 24 hr tablet Take 1 tablet (30 mg total) by mouth daily. <PLEASE MAKE APPOINTMENT FOR REFILLS> 30 tablet 0  . metoprolol succinate (TOPROL XL) 50 MG 24 hr tablet Take 50 mg by mouth daily. Take with or immediately following a meal.    . nitroGLYCERIN (NITROSTAT) 0.4 MG SL tablet Place 1 tablet (0.4 mg total) under the tongue every 5 (five) minutes as needed for chest pain. 25 tablet 12  . oxyCODONE-acetaminophen (PERCOCET) 10-325  MG per tablet Take 1 tablet by mouth every 4 (four) hours as needed for pain. (Patient taking differently: Take 1 tablet by mouth 2 (two) times daily as needed for pain. ) 30 tablet 0  . polyethylene glycol-electrolytes (NULYTELY/GOLYTELY) 420 G solution Take 4,000 mLs by mouth once. 4000 mL 0  . potassium chloride SA (K-DUR,KLOR-CON) 20 MEQ tablet Take 20 mEq by mouth daily.      . pregabalin (LYRICA) 50 MG capsule Take one capsule by mouth twice daily for pains 60 capsule 5  . ramipril (ALTACE) 2.5 MG capsule Take 2.5 mg by mouth at bedtime.     . ramipril (ALTACE) 5 MG capsule Take 5 mg by mouth every morning.    . rosuvastatin (CRESTOR) 20 MG tablet TAKE (1) TABLET BY MOUTH AT BEDTIME FOR CHOLESTEROL. 30 tablet 0  . sitaGLIPtin (JANUVIA) 50 MG tablet Take 50 mg by mouth daily.    . vitamin C (ASCORBIC ACID) 500 MG tablet Take 500 mg by mouth daily.    [name] warfarin (COUMADIN) 5 MG tablet Take 1 tablet by mouth daily or as directed by coumadin clinic. 30 tablet 1   Facility-Administered Medications Ordered in Other Encounters  Medication Dose Route Frequency Provider Last Rate Last Dose  . 0.9 %  sodium chloride infusion   Intravenous Continuous Herbie Baltimore  Hilton Cork, MD 50 mL/hr at 05/30/15 1103      Allergies as of 05/30/2015  . (No Known Allergies)    Family History  Problem Relation Age of Onset  . Tanner cancer Neg Hx   . Liver disease Neg Hx     Social History   Social History  . Marital Status: Legally Separated    Spouse Name: N/A  . Number of Children: 5  . Years of Education: N/A   Occupational History  . retired     Stage manager   Social History Main Topics  . Smoking status: Former Smoker -- 1.00 packs/day for 55 years    Types: Cigarettes    Start date: 08/19/1953    Quit date: 05/07/2014  . Smokeless tobacco: Current User    Types: Chew    Last Attempt to Quit: 08/20/1995     Comment: Has quit on 3 occasions. Counseling given today 5-10 minutes   I am more than  likely going to quit "  . Alcohol Use: No     Comment: socially, sometimes 12 ounce beer daily, may go month without/no whiskey  . Drug Use: No  . Sexual Activity: Not on file   Other Topics Concern  . Not on file   Social History Narrative   Has 3 daughters   Has 2 sons    Review of Systems: Gen: see HPI  CV: see HPI  Resp: +SOB, +DOE GI: see HPI  GU : Denies urinary burning, urinary frequency, urinary incontinence.  MS: +phantom limb pain  Derm: Denies rash, itching, dry skin Psych: Denies depression, anxiety,confusion, or memory loss Heme: see HPI   Physical Exam: Vital signs in last 24 hours: Temp:  [98.1 F (36.7 C)] 98.1 F (36.7 C) (10/11 1129) Pulse Rate:  [32-90] 32 (10/11 1400) Resp:  [18-21] 21 (10/11 1400) BP: (121-128)/(35-53) 121/53 mmHg (10/11 1400) SpO2:  [98 %-100 %] 98 % (10/11 1400) Weight:  [214 lb (97.07 kg)-215 lb (97.523 kg)] 215 lb (97.523 kg) (10/11 1129)   General:   Alert, pale but no distress Head:  Normocephalic and atraumatic. Eyes:  Sclera clear, no icterus.   Conjunctiva pink. Ears:  Normal auditory acuity. Nose:  No deformity, discharge,  or lesions. Mouth:  No deformity or lesions, edentulous Lungs:  Clear throughout to auscultation.   No wheezes, crackles, or rhonchi. No acute distress. Heart:  S 1 S2 present, irregular Abdomen:  Soft, nontender and nondistended. No masses, hepatosplenomegaly. Normal bowel sounds. Small umbilical hernia  Rectal:  Deferred until time of colonoscopy.   Msk:  Symmetrical without gross deformities. Normal posture. Extremities:  Left BKA Neurologic:  Alert and  oriented x4 Psych:  Alert and cooperative. Normal mood and affect.  Intake/Output from previous day:   Intake/Output this shift: Total I/O In: 500 [I.V.:500] Out: -   Lab Results:  Recent Labs  05/30/15 1043 05/30/15 1158  WBC 12.0* 12.0*  HGB 5.5* 5.5*  HCT 18.7* 18.6*  PLT 350 365   BMET  Recent Labs  05/30/15 1043  05/30/15 1158  NA 137 139  K 4.3 4.3  CL 109 110  CO2 23 23  GLUCOSE 239* 161*  BUN 63* 60*  CREATININE 1.74* 1.65*  CALCIUM 8.3* 8.3*   LFT  Recent Labs  05/30/15 1158  PROT 6.3*  ALBUMIN 3.3*  AST 19  ALT 10*  ALKPHOS 21*  BILITOT 0.4   PT/INR  Recent Labs  05/30/15 1250  LABPROT 48.9*  INR 5.68*  Studies/Results: Dg Chest Portable 1 View  05/30/2015   CLINICAL DATA:  EKG changes, hypertension, diabetes  EXAM: PORTABLE CHEST 1 VIEW  COMPARISON:  02/04/2015  FINDINGS: Mild right basilar airspace disease which may reflect scarring versus atelectasis. There is no other focal parenchymal opacity. There is no pleural effusion or pneumothorax. There is stable cardiomegaly. There is evidence of prior CABG.  The osseous structures are unremarkable.  IMPRESSION: 1. Mild right basilar airspace disease which may reflect scarring versus atelectasis.   Electronically Signed   By: Kathreen Devoid   On: 05/30/2015 12:12    Impression: 74 year old pleasant male, poor historian, well known to our practice with an extensive medical history as outlined above, multiple comorbidities, and need for chronic anticoagulation in the setting of prosthetic valve and recent vascular stent for SMA stenosis, presenting with profound anemia/occult GI bleed. Chest pain likely secondary to demand ischemia. Last EGD fairly recent by Dr. Michail Sermon February 09, 2015 during similar presentation in [place], and this was unrevealing. Last colonoscopy in 2012 by Dr. [name] with ascending Tanner ischemic ulcers. No capsule study has been completed to date. Known history of IDA and intolerant to oral iron. With antiplatelet and anticoagulation therapy, he is at risk for bleeding anywhere in the GI tract. Possible melena approximately 2 weeks ago without further evidence of overt GI bleeding. As last EGD is up-to-date, would favor colonoscopy first to rule out lower GI source, and I anticipate a capsule study in the future  as well. Situation complicated by need for chronic anticoagulation. Currently, INR is supratherapeutic, but with prosthetic valve would be cautious with Vit K. No overt GI bleeding currently. Will need cardiology input prior to any procedures and assistance with anticoagulation scenario due to known prosthetic valve. May need heparin drip. Will tentatively plan for colonoscopy Thursday, barring any contraindication from a cardiac standpoint.   Dysphagia: vague reports per patient. Would evaluate with BPE as outpatient. Poor fitting dentures may exacerbate.   Plan: Clear liquids Monitor INR Transfusion of PRBCs: 1 unit already ordered. Per admitting physician but anticipate at least 1 additional unit. Keep 2 units ahead Cardiology Consult Tentatively plan for colonoscopy on Thursday Possible capsule study if negative colonoscopy   Orvil Feil, ANP-BC [facility]          05/30/2015, 2:44 PM

## 2015-05-30 NOTE — Telephone Encounter (Signed)
Pt's daughter is calling about his TCS on Thursday. She stated that he is taking the Plavix and  Coumadin. She does not understand anything about him stopping the Plavix. Please advise

## 2015-05-30 NOTE — Progress Notes (Signed)
Transferred to pt's own w/c. Cardiac monitor and O2 in place. IV infusing without difficulty. Continues to deny chest pain. Transferred to ER.

## 2015-05-30 NOTE — Progress Notes (Signed)
Dr [name] notified of EKG results. Instructed to transfer pt to ER.

## 2015-05-30 NOTE — Telephone Encounter (Signed)
What is her name? Number? I'm not sure who to call.   I tried to call patient. No one available.   He was supposed to stop Plavix per Dr. Earleen Newport. In place of this take an 81 mg aspirin. CONTINUE COUMADIN. IT IS OK to do this for the procedure. He is at an increased bleeding risk because of anticoagulation but it is better to continue him on this than risk thromboembolism.

## 2015-05-30 NOTE — Progress Notes (Signed)
Wants to void before transfer to ER. Urinal provided. Voided 400 ml of clear yellow urine.

## 2015-05-30 NOTE — ED Provider Notes (Signed)
CSN: [id number]     Arrival date & time 05/30/15  1124 History  By signing my name below, I, Starleen Arms, attest that this documentation has been prepared under the direction and in the presence of Yohannes Waibel Julio Alm, MD. Electronically Signed: Starleen Arms, ED Scribe. 05/30/2015. 12:46 PM.    Chief Complaint  Patient presents with  . Abnormal ECG   The history is provided by the patient. No language interpreter was used.   HPI Comments: Tanner Pena is a 74 y.o. male who presents to the Emergency Department from [facility] for abnormal ECG.  The patient was seen today at [facility] for colonoscopy pre-op appointment (Dr. Sydell Axon) and iron infusion.  He reported to them he had 20 minutes of non-radiating chest pressure this morning which prompted the ECG.  Over the past several days, the patient has had some n/v and, per daughter, over the past month, the patient has had currently resolved melena.  Patient states he has two stents placed in his small bowel.    Cardiologist: Dr. Acey Lav ([place]) Past Medical History  Diagnosis Date  . Diabetes mellitus 2007  . Hypertension   . Gout   . Hypercholesteremia   . Peripheral vascular disease ([place])     stents lower extremity  . COPD (chronic obstructive pulmonary disease) ([place])   . S/P aortic valve replacement 1990    a. [employer]  . Chronic back pain   . Dysphagia   . Neuromuscular disorder ([place])   . Peripheral neuropathy ([place])   . Critical lower limb ischemia   . CAD (coronary artery disease)     a. 05/13/14 [name] s/p overlapping DESx2 to SVG to RCA. b.  s/p CABG '90 with redo '94 & stent to RCA SVG in 2005  . Chronic toe ulcer ([place])     a. left foot  . Shortness of breath   . CHF (congestive heart failure) ([place])   . Stone in kidney   . GERD (gastroesophageal reflux disease)   . Arthritis   . Sleep apnea     tested greater than 7 years ago per patient  . Myocardial infarction [facility])      not sure when had MI.   Past Surgical History  Procedure Laterality Date  . Open heart surgery  1990    prosthetic heart valve, one bypass  . Rotator cuff repair      right  . Cataract extraction      bilateral  . Coronary stent placement  2005    RCA vein graft A 3.0x13.0 TAXUS stent was then placed int he vessel a Viva 3.0x4.0 (perfusion balloon was made ready it was placed through the entire lenght of the stent  . Peripheral vascular procedures lower extremities      right external iliac  artery PTA and stenting as well as bilateral SFA intervention remotely. Repeat procedures in 2011 bilaterally  . Coronary artery bypass graft  1994    6 vessels  . [name]  06/13/2011    Procedure: Venia Minks DILATION;  Surgeon: [name], MD;  Location: [facility];  Service: Endoscopy;  Laterality: N/A;  Dilated to 56.   . Angioplasty illiac artery    . Back surgery  4481,8563    2  . Eye surgery    . Amputation Left 07/13/2014    Procedure: Transmetatarsal Amputation;  Surgeon: [name], MD;  Location: [place];  Service: Orthopedics;  Laterality: Left;  .  Lower extremity angiogram N/A 02/15/2013    Procedure: LOWER EXTREMITY ANGIOGRAM;  Surgeon: [name], MD;  Location: [facility] CATH LAB;  Service: Cardiovascular;  Laterality: N/A;  . Left heart catheterization with coronary angiogram N/A 05/11/2014    Procedure: LEFT HEART CATHETERIZATION WITH CORONARY ANGIOGRAM;  Surgeon: [name], MD;  Location: [facility] CATH LAB;  Service: Cardiovascular;  Laterality: N/A;  . Percutaneous coronary stent intervention (pci-s) N/A 05/13/2014    Procedure: PERCUTANEOUS CORONARY STENT INTERVENTION (PCI-S);  Surgeon: [name], MD;  Location: [facility] CATH LAB;  Service: Cardiovascular;  Laterality: N/A;  . Lower extremity angiogram N/A 06/06/2014    Procedure: LOWER EXTREMITY ANGIOGRAM;  Surgeon: [name], MD;  Location: [facility] CATH LAB;  Service: Cardiovascular;  Laterality: N/A;  .  Amputation Left 08/20/2014    Procedure: Revision Transmetatarsal Amputation versus Below Knee Amputation;  Surgeon: [name], MD;  Location: [place];  Service: Orthopedics;  Laterality: Left;  . Stump revision Left 09/23/2014    Procedure: Revision Left Below Knee Amputation;  Surgeon: [name], MD;  Location: [place];  Service: Orthopedics;  Laterality: Left;  . Stump revision Left 10/13/2014    Procedure: REVISION LEFT BELOW KNEE AMPUTATION STUMP;  Surgeon: [name], MD;  Location: [facility];  Service: Orthopedics;  Laterality: Left;  . Esophagogastroduodenoscopy N/A 02/09/2015    DR. Schooler: Normal EGD  . Esophagogastroduodenoscopy  05/2011    Dr. [name]: s/p esophageal dilation, antral erosions/nodularity with benign biopsies  . Colonoscopy  05/2011    Dr. [name]: benign rectal polyp, left sided tics, ascending colonic ulcers (path c/w ischemia)   Family History  Problem Relation Age of Onset  . Colon cancer Neg Hx   . Liver disease Neg Hx    Social History  Substance Use Topics  . Smoking status: Former Smoker -- 1.00 packs/day for 55 years    Types: Cigarettes    Start date: 08/19/1953    Quit date: 05/07/2014  . Smokeless tobacco: Current User    Types: Chew    Last Attempt to Quit: 08/20/1995     Comment: Has quit on 3 occasions. Counseling given today 5-10 minutes   I am more than likely going to quit "  . Alcohol Use: No     Comment: socially, sometimes 12 ounce beer daily, may go month without/no whiskey    Review of Systems A complete 10 system review of systems was obtained and all systems are negative except as noted in the HPI and PMH.    Allergies  Review of patient's allergies indicates no known allergies.  Home Medications   Prior to Admission medications   Medication Sig Start Date End Date Taking? Authorizing Provider  ACCU-CHEK AVIVA PLUS test strip  12/22/14   Historical Provider, MD  albuterol (PROVENTIL) 4 MG tablet Take 4 mg by mouth 3  (three) times daily. 01/20/15   Historical Provider, MD  albuterol-ipratropium (COMBIVENT) 18-103 MCG/ACT inhaler Inhale 1 puff into the lungs 4 (four) times daily. Coughing/ Shortness of Breath    Historical Provider, MD  allopurinol (ZYLOPRIM) 300 MG tablet Take 300 mg by mouth at bedtime.     Historical Provider, MD  aspirin EC 81 MG EC tablet Take 1 tablet (81 mg total) by mouth daily. 03/11/15   Kelvin Cellar, MD  cholecalciferol (VITAMIN D) 1000 UNITS tablet Take 2,000 Units by mouth daily.    Historical Provider, MD  clopidogrel (PLAVIX) 75 MG tablet Take 1 tablet (75  mg total) by mouth daily. <PLEASE MAKE APPOINTMENT FOR REFILLS> 05/22/15   [name], MD  Emollient (EUCERIN) lotion Apply 10 mLs topically as needed for dry skin.    Historical Provider, MD  fenofibrate (TRICOR) 145 MG tablet Take 1 tablet (145 mg total) by mouth daily. NEED OV. 05/19/15   [name], MD  fish oil-omega-3 fatty acids 1000 MG capsule Take 1 capsule (1 g total) by mouth 2 (two) times daily. 01/22/13   Casimiro Needle Alvstad, RPH-CPP  furosemide (LASIX) 40 MG tablet Take 1 tablet (40 mg total) by mouth daily. Patient taking differently: Take 80 mg by mouth daily.  03/11/15   Kelvin Cellar, MD  glimepiride (AMARYL) 1 MG tablet Take 1 mg by mouth 2 (two) times daily.    Historical Provider, MD  isosorbide mononitrate (IMDUR) 30 MG 24 hr tablet Take 1 tablet (30 mg total) by mouth daily. <PLEASE MAKE APPOINTMENT FOR REFILLS> 05/19/15   [name], MD  metoprolol succinate (TOPROL XL) 50 MG 24 hr tablet Take 50 mg by mouth daily. Take with or immediately following a meal.    Historical Provider, MD  nitroGLYCERIN (NITROSTAT) 0.4 MG SL tablet Place 1 tablet (0.4 mg total) under the tongue every 5 (five) minutes as needed for chest pain. 05/17/14   Eileen Stanford, PA-C  oxyCODONE-acetaminophen (PERCOCET) 10-325 MG per tablet Take 1 tablet by mouth every 4 (four) hours as needed for pain. Patient taking  differently: Take 1 tablet by mouth 2 (two) times daily as needed for pain.  09/23/14   [name], MD  polyethylene glycol-electrolytes (NULYTELY/GOLYTELY) 420 G solution Take 4,000 mLs by mouth once. 05/18/15   Orvil Feil, NP  potassium chloride SA (K-DUR,KLOR-CON) 20 MEQ tablet Take 20 mEq by mouth daily.      Historical Provider, MD  pregabalin (LYRICA) 50 MG capsule Take one capsule by mouth twice daily for pains 08/23/14   Blanchie Serve, MD  ramipril (ALTACE) 2.5 MG capsule Take 2.5 mg by mouth at bedtime.  02/17/15   Historical Provider, MD  ramipril (ALTACE) 5 MG capsule Take 5 mg by mouth every morning.    Historical Provider, MD  rosuvastatin (CRESTOR) 20 MG tablet TAKE (1) TABLET BY MOUTH AT BEDTIME FOR CHOLESTEROL. 05/19/15   [name], MD  sitaGLIPtin (JANUVIA) 50 MG tablet Take 50 mg by mouth daily.    Historical Provider, MD  vitamin C (ASCORBIC ACID) 500 MG tablet Take 500 mg by mouth daily.    Historical Provider, MD  warfarin (COUMADIN) 5 MG tablet Take 1 tablet by mouth daily or as directed by coumadin clinic. 05/25/15   [name], MD   BP 123/51 mmHg  Pulse 69  Temp(Src) 98.1 F (36.7 C) (Oral)  Resp 18  Ht '5\' 9"'$  (1.753 m)  Wt 215 lb (97.523 kg)  BMI 31.74 kg/m2  SpO2 100% Physical Exam  Constitutional: He is oriented to person, place, and time. He appears well-developed and well-nourished. No distress.  HENT:  Head: Normocephalic and atraumatic.  Eyes: Conjunctivae and EOM are normal.  Neck: Neck supple. No tracheal deviation present.  Cardiovascular: Normal rate.   Pulmonary/Chest: Effort normal. No respiratory distress.  Abdominal: Soft.  Musculoskeletal: Normal range of motion.  BKA.  Neurological: He is alert and oriented to person, place, and time.  Skin: Skin is warm and dry.  Psychiatric: He has a normal mood and affect. His behavior is normal.  Nursing note and vitals  reviewed.   ED Course  Procedures (including critical care  time)  DIAGNOSTIC STUDIES: Oxygen Saturation is 100% on RA, normal by my interpretation.    COORDINATION OF CARE:  12:06 PM Will order labs.  Patient acknowledges and agrees with plan.    Labs Review Labs Reviewed  CBC WITH DIFFERENTIAL/PLATELET - Abnormal; Notable for the following:    WBC 12.0 (*)    RBC 2.47 (*)    Hemoglobin 5.5 (*)    HCT 18.6 (*)    MCV 75.3 (*)    MCH 22.3 (*)    MCHC 29.6 (*)    RDW 17.5 (*)    Neutro Abs 9.6 (*)    All other components within normal limits  COMPREHENSIVE METABOLIC PANEL - Abnormal; Notable for the following:    Glucose, Bld 161 (*)    BUN 60 (*)    Creatinine, Ser 1.65 (*)    Calcium 8.3 (*)    Total Protein 6.3 (*)    Albumin 3.3 (*)    ALT 10 (*)    Alkaline Phosphatase 21 (*)    GFR calc non Af Amer 39 (*)    GFR calc Af Amer 46 (*)    All other components within normal limits  TROPONIN I  PROTIME-INR  IRON AND TIBC  I-STAT TROPOININ, ED  I-STAT CG4 LACTIC ACID, ED  TYPE AND SCREEN  PREPARE RBC (CROSSMATCH)    Imaging Review Dg Chest Portable 1 View  05/30/2015   CLINICAL DATA:  EKG changes, hypertension, diabetes  EXAM: PORTABLE CHEST 1 VIEW  COMPARISON:  02/04/2015  FINDINGS: Mild right basilar airspace disease which may reflect scarring versus atelectasis. There is no other focal parenchymal opacity. There is no pleural effusion or pneumothorax. There is stable cardiomegaly. There is evidence of prior CABG.  The osseous structures are unremarkable.  IMPRESSION: 1. Mild right basilar airspace disease which may reflect scarring versus atelectasis.   Electronically Signed   By: Kathreen Devoid   On: 05/30/2015 12:12   I have personally reviewed and evaluated these images and lab results as part of my medical decision-making.   EKG Interpretation None         Abnormal EKG.  Unchanged from prior in June MUSE reading done, not transfering over.   MDM   Final diagnoses:  None    Patient is a very pleasant  74 year old male with history of mesenteric ischemia and mechanical heart valve presenting today with abnormal EKG. Patient was getting an iron infusion and labs drawn prior to colonoscopy scheduled for Thursday. In his preop appointment patient was noted have an abnormal EKG sent here. Patient reports only a small amount of chest pain this morning lasting less than 20 minutes. EKG is unchanged from prior. We will check troponins. More concerning is a hemoglobin of 5 that was found during these preop labs. This could be concerning to his chest pain, mild cardiac ischemia given his lack of hemoglobin. Patient's had low hemoglboin previously in the past. He's had stents placed her mesenteric ischemia. He is followed by Dr. Sydell Axon. His last EGD was in June 2016. We will repeat labs here. If hemoglobin is indeed 5, will transfuse. We'll cycle troponins. We'll need to admit for GI bleeding. Patient has no active abdominal pain currently.  We'll discuss with Dr. Sydell Axon.   3:26 PM Discussed with GI, Cards and admitting team.  Elevated INR, gave 5 vit K, hold coumadin.    I, Waldron, personally performed  the services described in this documentation. All medical record entries made by the scribe were at my direction and in my presence.  I have reviewed the chart and discharge instructions and agree that the record reflects my personal performance and is accurate and complete. [place].  05/30/2015. 12:46 PM.   CRITICAL CARE Performed by: Gardiner Sleeper Total critical care time: 1 hour Critical care time was exclusive of separately billable procedures and treating other patients. Critical care was necessary to treat or prevent imminent or life-threatening deterioration. Critical care was time spent personally by me on the following activities: development of treatment plan with patient and/or surrogate as well as nursing, discussions with consultants, evaluation of patient's  response to treatment, examination of patient, obtaining history from patient or surrogate, ordering and performing treatments and interventions, ordering and review of laboratory studies, ordering and review of radiographic studies, pulse oximetry and re-evaluation of patient's condition.   Timika Muench Julio Alm, MD 05/30/15 1527

## 2015-05-30 NOTE — ED Notes (Signed)
Hospitalist repaged at [facility] request.

## 2015-05-30 NOTE — Progress Notes (Signed)
Report to Royston Sinner RN

## 2015-05-30 NOTE — ED Notes (Signed)
Phlebotomy at bedside.

## 2015-05-30 NOTE — H&P (Signed)
[facility] History and Physical  Tanner Pena QMV:[id number] DOB: 05/18/1941 DOA: 05/30/2015  Referring physician: Dr. Thomasene Lot, ED PCP: [name], MD   Chief Complaint: chest pain  HPI: Tanner Pena is a 73 y.o. male with a complex past medical history including coronary artery disease, mechanical aortic valve, chronic anticoagulation, chronic mesenteric ischemia status post stenting of SMA in 02/2015, iron deficiency anemia. Patient had presented to short stay today for IV iron infusion therapy. He was planned to have a colonoscopy on 10/13 to further evaluate iron deficiency anemia. While a short stay, he complained of some chest discomfort. EKG was obtained and was found to be abnormal he was sent to the emergency room for evaluation. He reports that his chest pain resolved after belching. He does describe having shortness of breath which is worse on exertion for the past several weeks. He is also noted intermittent black-colored stool but denies any hematochezia or hematemesis. He has not had any abdominal pain, fever. He was evaluated in the emergency room where hemoglobin was noted to be low at 5.5 and INR was noted to be elevated at 5.6. The patient has been referred for admission.   Review of Systems:  Pertinent positives as per HPI, otherwise negative  Past Medical History  Diagnosis Date  . Type 2 diabetes mellitus ([place]) 2007  . Essential hypertension   . Gout   . Hypercholesteremia   . Peripheral vascular disease ([place])     Lower extremity PCI/stenting  . COPD (chronic obstructive pulmonary disease) ([place])   . S/P aortic valve replacement 1990    a. St. Jude  . Chronic back pain   . Dysphagia   . Neuromuscular disorder ([place])   . Peripheral neuropathy ([place])   . Critical lower limb ischemia   . CAD (coronary artery disease)     a. 05/13/14 [name] s/p overlapping DESx2 to SVG to RCA. b.  s/p CABG '90 with redo '94 & stent to RCA SVG in 2005  . Chronic toe  ulcer ([place])     a. Left foot  . Chronic systolic heart failure ([place])   . History of kidney stones   . GERD (gastroesophageal reflux disease)   . Arthritis   . Sleep apnea   . Myocardial infarction [facility])    Past Surgical History  Procedure Laterality Date  . Aortic valve replacement  1990    St. Jude  . Rotator cuff repair      right  . Cataract extraction      bilateral  . Coronary stent placement  2005    RCA vein graft A 3.0x13.0 TAXUS stent was then placed int he vessel a Viva 3.0x4.0 (perfusion balloon was made ready it was placed through the entire lenght of the stent  . Peripheral vascular procedures lower extremities      right external iliac  artery PTA and stenting as well as bilateral SFA intervention remotely. Repeat procedures in 2011 bilaterally  . Coronary artery bypass graft  1994    6 vessels  . [name]  06/13/2011    Procedure: Venia Minks DILATION;  Surgeon: [name], MD;  Location: [facility];  Service: Endoscopy;  Laterality: N/A;  Dilated to 56.   . Angioplasty illiac artery    . Back surgery  2841,3244    2  . Eye surgery    . Amputation Left 07/13/2014    Procedure: Transmetatarsal Amputation;  Surgeon: [name], MD;  Location: [place];  Service:  Orthopedics;  Laterality: Left;  . Lower extremity angiogram N/A 02/15/2013    Procedure: LOWER EXTREMITY ANGIOGRAM;  Surgeon: [name], MD;  Location: [facility] CATH LAB;  Service: Cardiovascular;  Laterality: N/A;  . Left heart catheterization with coronary angiogram N/A 05/11/2014    Procedure: LEFT HEART CATHETERIZATION WITH CORONARY ANGIOGRAM;  Surgeon: [name], MD;  Location: [facility] CATH LAB;  Service: Cardiovascular;  Laterality: N/A;  . Percutaneous coronary stent intervention (pci-s) N/A 05/13/2014    Procedure: PERCUTANEOUS CORONARY STENT INTERVENTION (PCI-S);  Surgeon: [name], MD;  Location: [facility] CATH LAB;  Service: Cardiovascular;  Laterality: N/A;  . Lower extremity  angiogram N/A 06/06/2014    Procedure: LOWER EXTREMITY ANGIOGRAM;  Surgeon: [name], MD;  Location: [facility] CATH LAB;  Service: Cardiovascular;  Laterality: N/A;  . Amputation Left 08/20/2014    Procedure: Revision Transmetatarsal Amputation versus Below Knee Amputation;  Surgeon: [name], MD;  Location: [place];  Service: Orthopedics;  Laterality: Left;  . Stump revision Left 09/23/2014    Procedure: Revision Left Below Knee Amputation;  Surgeon: [name], MD;  Location: [place];  Service: Orthopedics;  Laterality: Left;  . Stump revision Left 10/13/2014    Procedure: REVISION LEFT BELOW KNEE AMPUTATION STUMP;  Surgeon: [name], MD;  Location: [facility];  Service: Orthopedics;  Laterality: Left;  . Esophagogastroduodenoscopy N/A 02/09/2015    DR. Schooler: Normal EGD  . Esophagogastroduodenoscopy  05/2011    Dr. [name]: s/p esophageal dilation, antral erosions/nodularity with benign biopsies  . Colonoscopy  05/2011    Dr. [name]: benign rectal polyp, left sided tics, ascending colonic ulcers (path c/w ischemia)   Social History:  reports that he quit smoking about 12 months ago. His smoking use included Cigarettes. He started smoking about 61 years ago. He has a 55 pack-year smoking history. His smokeless tobacco use includes Chew. He reports that he does not drink alcohol or use illicit drugs.  No Known Allergies  Family History  Problem Relation Age of Onset  . Colon cancer Neg Hx   . Liver disease Neg Hx      Prior to Admission medications   Medication Sig Start Date End Date Taking? Authorizing Provider  ACCU-CHEK AVIVA PLUS test strip  12/22/14   Historical Provider, MD  albuterol (PROVENTIL) 4 MG tablet Take 4 mg by mouth 3 (three) times daily. 01/20/15   Historical Provider, MD  albuterol-ipratropium (COMBIVENT) 18-103 MCG/ACT inhaler Inhale 1 puff into the lungs 4 (four) times daily. Coughing/ Shortness of Breath    Historical Provider, MD  allopurinol (ZYLOPRIM) 300  MG tablet Take 300 mg by mouth at bedtime.     Historical Provider, MD  aspirin EC 81 MG EC tablet Take 1 tablet (81 mg total) by mouth daily. 03/11/15   Kelvin Cellar, MD  cholecalciferol (VITAMIN D) 1000 UNITS tablet Take 2,000 Units by mouth daily.    Historical Provider, MD  clopidogrel (PLAVIX) 75 MG tablet Take 1 tablet (75 mg total) by mouth daily. <PLEASE MAKE APPOINTMENT FOR REFILLS> 05/22/15   [name], MD  Emollient (EUCERIN) lotion Apply 10 mLs topically as needed for dry skin.    Historical Provider, MD  fenofibrate (TRICOR) 145 MG tablet Take 1 tablet (145 mg total) by mouth daily. NEED OV. 05/19/15   [name], MD  fish oil-omega-3 fatty acids 1000 MG capsule Take 1 capsule (1 g total) by mouth 2 (two) times daily. 01/22/13   Erasmo Downer L  [place], RPH-CPP  furosemide (LASIX) 40 MG tablet Take 1 tablet (40 mg total) by mouth daily. Patient taking differently: Take 80 mg by mouth daily.  03/11/15   Kelvin Cellar, MD  glimepiride (AMARYL) 1 MG tablet Take 1 mg by mouth 2 (two) times daily.    Historical Provider, MD  isosorbide mononitrate (IMDUR) 30 MG 24 hr tablet Take 1 tablet (30 mg total) by mouth daily. <PLEASE MAKE APPOINTMENT FOR REFILLS> 05/19/15   [name], MD  metoprolol succinate (TOPROL XL) 50 MG 24 hr tablet Take 50 mg by mouth daily. Take with or immediately following a meal.    Historical Provider, MD  nitroGLYCERIN (NITROSTAT) 0.4 MG SL tablet Place 1 tablet (0.4 mg total) under the tongue every 5 (five) minutes as needed for chest pain. 05/17/14   Eileen Stanford, PA-C  oxyCODONE-acetaminophen (PERCOCET) 10-325 MG per tablet Take 1 tablet by mouth every 4 (four) hours as needed for pain. Patient taking differently: Take 1 tablet by mouth 2 (two) times daily as needed for pain.  09/23/14   [name], MD  polyethylene glycol-electrolytes (NULYTELY/GOLYTELY) 420 G solution Take 4,000 mLs by mouth once. 05/18/15   Orvil Feil, NP  potassium chloride SA  (K-DUR,KLOR-CON) 20 MEQ tablet Take 20 mEq by mouth daily.      Historical Provider, MD  pregabalin (LYRICA) 50 MG capsule Take one capsule by mouth twice daily for pains 08/23/14   Blanchie Serve, MD  ramipril (ALTACE) 2.5 MG capsule Take 2.5 mg by mouth at bedtime.  02/17/15   Historical Provider, MD  ramipril (ALTACE) 5 MG capsule Take 5 mg by mouth every morning.    Historical Provider, MD  rosuvastatin (CRESTOR) 20 MG tablet TAKE (1) TABLET BY MOUTH AT BEDTIME FOR CHOLESTEROL. 05/19/15   [name], MD  sitaGLIPtin (JANUVIA) 50 MG tablet Take 50 mg by mouth daily.    Historical Provider, MD  vitamin C (ASCORBIC ACID) 500 MG tablet Take 500 mg by mouth daily.    Historical Provider, MD  warfarin (COUMADIN) 5 MG tablet Take 1 tablet by mouth daily or as directed by coumadin clinic. 05/25/15   [name], MD   Physical Exam: Filed Vitals:   05/30/15 1800 05/30/15 1815 05/30/15 1824 05/30/15 1844  BP:  130/45 125/79 129/42  Pulse: 66 65  75  Temp:  98.5 F (36.9 C) 98.5 F (36.9 C) 98.5 F (36.9 C)  TempSrc:  Oral Oral Oral  Resp: '19 20  24  '$ Height:      Weight:      SpO2: 100% 100%      Wt Readings from Last 3 Encounters:  05/30/15 91.7 kg (202 lb 2.6 oz)  05/30/15 97.07 kg (214 lb)  05/18/15 97.07 kg (214 lb)    General:  Appears calm and comfortable Eyes: PERRL, normal lids, conjunctivae are pale ENT: grossly normal hearing, lips & tongue Neck: no LAD, masses or thyromegaly Cardiovascular: RRR, no m/r/g. 1+ RLE edema. Respiratory: CTA bilaterally, no w/r/r. Normal respiratory effort. Abdomen: soft, ntnd Skin: no rash or induration seen on limited exam Musculoskeletal: left below the knee amputation Psychiatric: grossly normal mood and affect, speech fluent and appropriate Neurologic: grossly non-focal.          Labs on Admission:  Basic Metabolic Panel:  Recent Labs Lab 05/30/15 1043 05/30/15 1158  NA 137 139  K 4.3 4.3  CL 109 110  CO2 23 23  GLUCOSE  239* 161*  BUN 63* 60*  CREATININE 1.74* 1.65*  CALCIUM 8.3* 8.3*   Liver Function Tests:  Recent Labs Lab 05/30/15 1158  AST 19  ALT 10*  ALKPHOS 21*  BILITOT 0.4  PROT 6.3*  ALBUMIN 3.3*   No results for input(s): LIPASE, AMYLASE in the last 168 hours. No results for input(s): AMMONIA in the last 168 hours. CBC:  Recent Labs Lab 05/30/15 1043 05/30/15 1158  WBC 12.0* 12.0*  NEUTROABS  --  9.6*  HGB 5.5* 5.5*  HCT 18.7* 18.6*  MCV 75.4* 75.3*  PLT 350 365   Cardiac Enzymes:  Recent Labs Lab 05/30/15 1158  TROPONINI 0.03    BNP (last 3 results)  Recent Labs  12/28/14 0134 02/04/15 0413  BNP 504.3* 593.2*    ProBNP (last 3 results) No results for input(s): PROBNP in the last 8760 hours.  CBG: No results for input(s): GLUCAP in the last 168 hours.  Radiological Exams on Admission: Dg Chest Portable 1 View  05/30/2015   CLINICAL DATA:  EKG changes, hypertension, diabetes  EXAM: PORTABLE CHEST 1 VIEW  COMPARISON:  02/04/2015  FINDINGS: Mild right basilar airspace disease which may reflect scarring versus atelectasis. There is no other focal parenchymal opacity. There is no pleural effusion or pneumothorax. There is stable cardiomegaly. There is evidence of prior CABG.  The osseous structures are unremarkable.  IMPRESSION: 1. Mild right basilar airspace disease which may reflect scarring versus atelectasis.   Electronically Signed   By: Kathreen Devoid   On: 05/30/2015 12:12    EKG: Independently reviewed. Ventricular trigeminy  Assessment/Plan Principal Problem:   GI (gastrointestinal bleed) Active Problems:   DM2 (diabetes mellitus, type 2) (HCC)   COPD (chronic obstructive pulmonary disease) (HCC)   Warfarin-induced coagulopathy (HCC)   S/P below knee amputation (HCC)   Acute blood loss anemia   Mesenteric ischemia, chronic (HCC)   Anemia   Status post mechanical aortic valve replacement   Acute renal failure (HCC)   Coronary artery disease  involving native coronary artery of native heart with angina pectoris ([place])   1. Severe iron deficiency anemia. Likely related to chronic GI blood loss. He has not noted any fresh bleeding. He will be transfused a total of 3 units of PRBC at this time. We'll continue to follow serial CBCs. His anticoagulation is currently on hold. 2. Status post aortic valve replacement on long-term anticoagulation. Coumadin is currently on hold. He received a dose of vitamin K in the emergency room. Continue to follow serial INR. 3. Acute renal failure. Likely related to severe anemia. Continue to follow urine output, avoid nephrotoxic agents. 4. Coronary artery disease. Cardiology is following. Cycle cardiac markers. 5. Chronic GI blood loss. Gastroenterology is following and plans on colonoscopy on 10/13. Coumadin is on hold. We'll also hold Plavix for now. Recent interventional radiology notes indicate the plan was to transition off Plavix to aspirin. 6. Diabetes. Will use sliding scale insulin. 7. COPD . appears stable. No evidence of wheezing.   Code Status: full code DVT Prophylaxis: scd Family Communication: discussed with daughter at the bedside Disposition Plan: discharge home once improved  Time spent: 25mns  Tanner Pena [facility] Pager [phone]

## 2015-05-30 NOTE — ED Notes (Signed)
CRITICAL VALUE ALERT  Critical value received:  INR  Date of notification:  05/30/15  Time of notification:  0352  Critical value read back:Yes.    Nurse who received alert:  Maxwell Caul, RN  MD notified (1st page):  Dr Thomasene Lot  Time of first page:  [phone]

## 2015-05-30 NOTE — Progress Notes (Signed)
Patient arrived to unit. Unit of blood infusing.

## 2015-05-31 ENCOUNTER — Encounter: Payer: Self-pay | Admitting: *Deleted

## 2015-05-31 DIAGNOSIS — T45511A Poisoning by anticoagulants, accidental (unintentional), initial encounter: Secondary | ICD-10-CM

## 2015-05-31 DIAGNOSIS — D500 Iron deficiency anemia secondary to blood loss (chronic): Secondary | ICD-10-CM

## 2015-05-31 DIAGNOSIS — K921 Melena: Secondary | ICD-10-CM

## 2015-05-31 DIAGNOSIS — K922 Gastrointestinal hemorrhage, unspecified: Secondary | ICD-10-CM | POA: Diagnosis present

## 2015-05-31 DIAGNOSIS — Z952 Presence of prosthetic heart valve: Secondary | ICD-10-CM

## 2015-05-31 DIAGNOSIS — D689 Coagulation defect, unspecified: Secondary | ICD-10-CM

## 2015-05-31 HISTORY — DX: Gastrointestinal hemorrhage, unspecified: K92.2

## 2015-05-31 LAB — CBC
HCT: 27.7 % — ABNORMAL LOW (ref 39.0–52.0)
HEMATOCRIT: 27.1 % — AB (ref 39.0–52.0)
HEMATOCRIT: 26.7 % — AB (ref 39.0–52.0)
HEMATOCRIT: 29.6 % — AB (ref 39.0–52.0)
HEMOGLOBIN: 8.5 g/dL — AB (ref 13.0–17.0)
Hemoglobin: 8.6 g/dL — ABNORMAL LOW (ref 13.0–17.0)
Hemoglobin: 8.4 g/dL — ABNORMAL LOW (ref 13.0–17.0)
Hemoglobin: 9.1 g/dL — ABNORMAL LOW (ref 13.0–17.0)
MCH: 24.9 pg — AB (ref 26.0–34.0)
MCH: 24.8 pg — AB (ref 26.0–34.0)
MCH: 25.1 pg — ABNORMAL LOW (ref 26.0–34.0)
MCH: 24.9 pg — ABNORMAL LOW (ref 26.0–34.0)
MCHC: 31.4 g/dL (ref 30.0–36.0)
MCHC: 31 g/dL (ref 30.0–36.0)
MCHC: 31.5 g/dL (ref 30.0–36.0)
MCHC: 30.7 g/dL (ref 30.0–36.0)
MCV: 79.5 fL (ref 78.0–100.0)
MCV: 79.8 fL (ref 78.0–100.0)
MCV: 79.9 fL (ref 78.0–100.0)
MCV: 80.9 fL (ref 78.0–100.0)
PLATELETS: 313 10*3/uL (ref 150–400)
PLATELETS: 275 10*3/uL (ref 150–400)
PLATELETS: 319 10*3/uL (ref 150–400)
Platelets: 296 10*3/uL (ref 150–400)
RBC: 3.41 MIL/uL — ABNORMAL LOW (ref 4.22–5.81)
RBC: 3.47 MIL/uL — ABNORMAL LOW (ref 4.22–5.81)
RBC: 3.34 MIL/uL — ABNORMAL LOW (ref 4.22–5.81)
RBC: 3.66 MIL/uL — ABNORMAL LOW (ref 4.22–5.81)
RDW: 18.1 % — ABNORMAL HIGH (ref 11.5–15.5)
RDW: 18 % — AB (ref 11.5–15.5)
RDW: 17.8 % — AB (ref 11.5–15.5)
RDW: 18.2 % — AB (ref 11.5–15.5)
WBC: 10.4 10*3/uL (ref 4.0–10.5)
WBC: 11.6 10*3/uL — ABNORMAL HIGH (ref 4.0–10.5)
WBC: 12.3 10*3/uL — ABNORMAL HIGH (ref 4.0–10.5)
WBC: 13.8 10*3/uL — ABNORMAL HIGH (ref 4.0–10.5)

## 2015-05-31 LAB — GLUCOSE, CAPILLARY
GLUCOSE-CAPILLARY: 132 mg/dL — AB (ref 65–99)
GLUCOSE-CAPILLARY: 99 mg/dL (ref 65–99)
Glucose-Capillary: 133 mg/dL — ABNORMAL HIGH (ref 65–99)
Glucose-Capillary: 113 mg/dL — ABNORMAL HIGH (ref 65–99)

## 2015-05-31 LAB — COMPREHENSIVE METABOLIC PANEL
ALT: 9 U/L — AB (ref 17–63)
AST: 18 U/L (ref 15–41)
Albumin: 3.1 g/dL — ABNORMAL LOW (ref 3.5–5.0)
Alkaline Phosphatase: 21 U/L — ABNORMAL LOW (ref 38–126)
Anion gap: 4 — ABNORMAL LOW (ref 5–15)
BILIRUBIN TOTAL: 0.8 mg/dL (ref 0.3–1.2)
BUN: 35 mg/dL — AB (ref 6–20)
CALCIUM: 8.8 mg/dL — AB (ref 8.9–10.3)
CHLORIDE: 113 mmol/L — AB (ref 101–111)
CO2: 22 mmol/L (ref 22–32)
CREATININE: 1.14 mg/dL (ref 0.61–1.24)
GLUCOSE: 130 mg/dL — AB (ref 65–99)
Potassium: 4.2 mmol/L (ref 3.5–5.1)
Sodium: 139 mmol/L (ref 135–145)
Total Protein: 6.1 g/dL — ABNORMAL LOW (ref 6.5–8.1)

## 2015-05-31 LAB — PROTIME-INR
INR: 1.67 — ABNORMAL HIGH (ref 0.00–1.49)
PROTHROMBIN TIME: 19.7 s — AB (ref 11.6–15.2)

## 2015-05-31 LAB — TROPONIN I
TROPONIN I: 0.04 ng/mL — AB (ref <0.031)
TROPONIN I: 0.03 ng/mL (ref <0.031)

## 2015-05-31 MED ORDER — PEG 3350-KCL-NABCB-NACL-NASULF 236 G PO SOLR
2000.0000 mL | Freq: Once | ORAL | Status: AC
  Administered 2015-05-31: 2000 mL via ORAL
  Filled 2015-05-31: qty 4000

## 2015-05-31 MED ORDER — PEG 3350-KCL-NABCB-NACL-NASULF 236 G PO SOLR
2000.0000 mL | Freq: Once | ORAL | Status: AC
  Administered 2015-06-01: 2000 mL via ORAL

## 2015-05-31 MED ORDER — METOPROLOL SUCCINATE ER 25 MG PO TB24
25.0000 mg | ORAL_TABLET | Freq: Every day | ORAL | Status: DC
  Administered 2015-05-31 – 2015-06-02 (×3): 25 mg via ORAL
  Filled 2015-05-31 (×3): qty 1

## 2015-05-31 MED ORDER — BISACODYL 5 MG PO TBEC
10.0000 mg | DELAYED_RELEASE_TABLET | Freq: Once | ORAL | Status: AC
  Administered 2015-05-31: 10 mg via ORAL
  Filled 2015-05-31: qty 2

## 2015-05-31 MED ORDER — PANTOPRAZOLE SODIUM 40 MG PO TBEC
40.0000 mg | DELAYED_RELEASE_TABLET | Freq: Two times a day (BID) | ORAL | Status: DC
  Administered 2015-05-31 – 2015-06-02 (×5): 40 mg via ORAL
  Filled 2015-05-31 (×4): qty 1

## 2015-05-31 MED ORDER — LEVALBUTEROL HCL 0.63 MG/3ML IN NEBU: 0.6300 mg | INHALATION_SOLUTION | Freq: Four times a day (QID) | RESPIRATORY_TRACT | Status: DC | PRN

## 2015-05-31 MED ORDER — RAMIPRIL 1.25 MG PO CAPS
2.5000 mg | ORAL_CAPSULE | Freq: Every day | ORAL | Status: DC
  Administered 2015-05-31 – 2015-06-02 (×3): 2.5 mg via ORAL
  Filled 2015-05-31 (×3): qty 2

## 2015-05-31 MED ORDER — ROSUVASTATIN CALCIUM 20 MG PO TABS
20.0000 mg | ORAL_TABLET | Freq: Every day | ORAL | Status: DC
Start: 2015-05-31 — End: 2015-06-02
  Administered 2015-05-31 – 2015-06-02 (×3): 20 mg via ORAL
  Filled 2015-05-31 (×3): qty 1

## 2015-05-31 NOTE — Progress Notes (Addendum)
[facility] PROGRESS NOTE  Tanner Pena GLO:[id number] DOB: [birth year] DOA: 05/30/2015 PCP: [name], MD    Code Status: Full code Family Communication: Discussed with patient; family not available Disposition Plan: discharge to home when clinically appropriate    Consultants:  Gastroenterology  Cardiology  Procedures:  Endoscopy, pending  Antibiotics:  None  HPI/Subjective: Patient says he feels a little better. He denies chest pain, shortness of breath, or palpitations. He has not had another bowel movement since being hospitalized.  Objective: Filed Vitals:   05/31/15 0900  BP: 114/77  Pulse: 63  Temp:   Resp: 19   temperature 98.3. Oxygen saturation 98%.    Intake/Output Summary (Last 24 hours) at 05/31/15 0942 Last data filed at 05/31/15 0900  Gross per 24 hour  Intake 2589.5 ml  Output   1925 ml  Net  664.5 ml   Filed Weights   05/30/15 1129 05/30/15 1648 05/31/15 0500  Weight: 97.523 kg (215 lb) 91.7 kg (202 lb 2.6 oz) 94.7 kg (208 lb 12.4 oz)    Exam:   General:  Pleasant alert 74 year old man laying in bed, in no acute distress.  Cardiovascular: S1, S2, with a soft systolic murmur and an S2 click; ectopic beats.  Respiratory: Rare wheeze; otherwise breathing nonlabored  Abdomen: Positive bowel sounds, soft, nontender, nondistended.  Musculoskeletal: Left lower extremity BKA stump intact and without edema. Right lower extremity without edema.  Neurologic: He is alert and oriented 2. Cranial nerves II through XII are grossly intact.   Data Reviewed: Basic Metabolic Panel:  Recent Labs Lab 05/30/15 1043 05/30/15 1158 05/31/15 0501  NA 137 139 139  K 4.3 4.3 4.2  CL 109 110 113*  CO2 '23 23 22  '$ GLUCOSE 239* 161* 130*  BUN 63* 60* 35*  CREATININE 1.74* 1.65* 1.14  CALCIUM 8.3* 8.3* 8.8*   Liver Function Tests:  Recent Labs Lab 05/30/15 1158 05/31/15 0501  AST 19 18  ALT 10* 9*  ALKPHOS 21* 21*  BILITOT  0.4 0.8  PROT 6.3* 6.1*  ALBUMIN 3.3* 3.1*   No results for input(s): LIPASE, AMYLASE in the last 168 hours. No results for input(s): AMMONIA in the last 168 hours. CBC:  Recent Labs Lab 05/30/15 1043 05/30/15 1158 05/31/15 0105 05/31/15 0501 05/31/15 0808  WBC 12.0* 12.0* 10.4 11.6* 12.3*  NEUTROABS  --  9.6*  --   --   --   HGB 5.5* 5.5* 8.5* 8.6* 8.4*  HCT 18.7* 18.6* 27.1* 27.7* 26.7*  MCV 75.4* 75.3* 79.5 79.8 79.9  PLT 350 365 296 313 275   Cardiac Enzymes:  Recent Labs Lab 05/30/15 1158 05/30/15 2109 05/31/15 0105 05/31/15 0501  TROPONINI 0.03 0.03 0.04* 0.03   BNP (last 3 results)  Recent Labs  12/28/14 0134 02/04/15 0413  BNP 504.3* 593.2*    ProBNP (last 3 results) No results for input(s): PROBNP in the last 8760 hours.  CBG:  Recent Labs Lab 05/30/15 2049 05/31/15 0729  GLUCAP 179* 133*    Recent Results (from the past 240 hour(s))  MRSA PCR Screening     Status: None   Collection Time: 05/30/15  4:33 PM  Result Value Ref Range Status   MRSA by PCR NEGATIVE NEGATIVE Final    Comment:        The GeneXpert MRSA Assay (FDA approved for NASAL specimens only), is one component of a comprehensive MRSA colonization surveillance program. It is not intended to diagnose MRSA infection nor to guide or monitor  treatment for MRSA infections.      Studies: Dg Chest Portable 1 View  05/30/2015  CLINICAL DATA:  EKG changes, hypertension, diabetes EXAM: PORTABLE CHEST 1 VIEW COMPARISON:  02/04/2015 FINDINGS: Mild right basilar airspace disease which may reflect scarring versus atelectasis. There is no other focal parenchymal opacity. There is no pleural effusion or pneumothorax. There is stable cardiomegaly. There is evidence of prior CABG. The osseous structures are unremarkable. IMPRESSION: 1. Mild right basilar airspace disease which may reflect scarring versus atelectasis. Electronically Signed   By: Kathreen Devoid   On: 05/30/2015 12:12     Scheduled Meds: . bisacodyl  10 mg Oral Once  . insulin aspart  0-15 Units Subcutaneous TID WC  . insulin aspart  0-5 Units Subcutaneous QHS  . metoprolol succinate  25 mg Oral Daily  . pantoprazole  40 mg Oral BID AC  . [START ON 06/01/2015] polyethylene glycol  2,000 mL Oral Once  . polyethylene glycol  2,000 mL Oral Once  . pregabalin  50 mg Oral BID  . ramipril  2.5 mg Oral Daily  . rosuvastatin  20 mg Oral Daily   Continuous Infusions:   Assessment and plan: Principal Problem:   GI bleeding Active Problems:   Acute blood loss anemia   Warfarin-induced coagulopathy (HCC)   Status post mechanical aortic valve replacement   Acute kidney injury ([place])   Coronary artery disease involving native coronary artery of native heart with angina pectoris (HCC)   DM2 (diabetes mellitus, type 2) (HCC)   COPD (chronic obstructive pulmonary disease) (HCC)   S/P below knee amputation (HCC)   Mesenteric ischemia, chronic (HCC)    1. GI bleeding in the setting of anticoagulation. Patient reported melena, but not hematemesis or hematochezia.  He denies having another bloody stool or melanotic stool since admission. -GI was consulted and plans a possible colonoscopy with capsule study. -GI added PPI therapy empirically. -Coumadin is being held with caution.  Acute on blood loss anemia, in the setting of anticoagulation. The patient was to be scheduled for a colonoscopy as an outpatient on 10/13. Patient's hemoglobin was 5.5 on admission. He was transfused 3 units of packed red blood cells. His hemoglobin has improved appropriately. -We'll continue to monitor.  Warfarin-induced coagulopathy. The patient is treated with warfarin chronically for aortic valve replacement. His INR was 5.68 on admission. He was given vitamin K. His INR has now fallen below 2. -We'll continue to hold Coumadin with agreement from cardiology and gastroenterology.   Acute kidney injury. The patient has no  history of chronic kidney disease. His creatinine was 1.74 on admission. Following gentle IV fluids and packed red blood cell transfusion, his creatinine has normalized.  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 -As above, Coumadin is being held pending endoscopy.  Recent chest pain in the setting of severe anemia. Patient's troponin I was marginally elevated, but has normalized. His EKG reveals no acute ST or T-wave changes. - denies chest pain currently. -He does have a history of CAD, status post CABG and stenting. He is being continued on Toprol XL, Crestor, ramipril. ? On fenofibrate and isosorbide mononitrate. -Cardiology was consulted and is following.  History of mesenteric ischemia, chronic; status post stenting of the proximal SMA and July 2016. -Stable.  Type 2 diabetes mellitus. Patient is treated with Amaryl and Januvia  chronically; both are being held . We'll continue sliding scale NovoLog. His CBGs are reasonable.  COPD. Patient has occasional  wheeze on exam, but no evidence of COPD exacerbation. Will add when necessary Xopenex nebulizer.       Time spent: 40 minutes.    [facility] Pager [phone]. If 7PM-7AM, please contact night-coverage at [email or web address], password [facility] 05/31/2015, 9:42 AM  LOS: 1 day

## 2015-05-31 NOTE — Progress Notes (Signed)
PT IS TOLERATING DRINKING HIS GOLYTELY GI PREP FOR HIS COLONOSCOPY IN AM.

## 2015-05-31 NOTE — Plan of Care (Signed)
Problem: Phase I Progression Outcomes Goal: OOB as tolerated unless otherwise ordered Outcome: Completed/Met Date Met:  05/31/15 Up in chair w/ minimal assist

## 2015-05-31 NOTE — Progress Notes (Signed)
Primary cardiologist: Dr. [name]  Seen for followup: History of St. Jude AVR, GI bleed  Subjective:    Slept well. Still having nausea and intermittent emesis. No obvious melena. No chest pain.  Objective:   Temp:  [97.9 F (36.6 C)-99.5 F (37.5 C)] 98.3 F (36.8 C) (10/12 0759) Pulse Rate:  [29-90] 64 (10/12 0500) Resp:  [15-26] 20 (10/12 0500) BP: (97-163)/(22-86) 122/27 mmHg (10/12 0415) SpO2:  [96 %-100 %] 97 % (10/12 0500) Weight:  [202 lb 2.6 oz (91.7 kg)-215 lb (97.523 kg)] 208 lb 12.4 oz (94.7 kg) (10/12 0500) Last BM Date: 05/30/15  Filed Weights   05/30/15 1129 05/30/15 1648 05/31/15 0500  Weight: 215 lb (97.523 kg) 202 lb 2.6 oz (91.7 kg) 208 lb 12.4 oz (94.7 kg)    Intake/Output Summary (Last 24 hours) at 05/31/15 0830 Last data filed at 05/31/15 0500  Gross per 24 hour  Intake 2109.5 ml  Output   1625 ml  Net  484.5 ml    Telemetry: Sinus rhythm with PVCs.  Exam:  General: No acute distress.  Lungs: Clear, nonlabored.  Cardiac: RRR with ectopic beats consistent with PVCs and mechanical click in S2.  Abdomen: Protuberant, decreased bowel sounds.  Extremities: No pitting edema.  Lab Results:  Basic Metabolic Panel:  Recent Labs Lab 05/30/15 1043 05/30/15 1158 05/31/15 0501  NA 137 139 139  K 4.3 4.3 4.2  CL 109 110 113*  CO2 '23 23 22  '$ GLUCOSE 239* 161* 130*  BUN 63* 60* 35*  CREATININE 1.74* 1.65* 1.14  CALCIUM 8.3* 8.3* 8.8*    Liver Function Tests:  Recent Labs Lab 05/30/15 1158 05/31/15 0501  AST 19 18  ALT 10* 9*  ALKPHOS 21* 21*  BILITOT 0.4 0.8  PROT 6.3* 6.1*  ALBUMIN 3.3* 3.1*    CBC:  Recent Labs Lab 05/31/15 0105 05/31/15 0501 05/31/15 0808  WBC 10.4 11.6* 12.3*  HGB 8.5* 8.6* 8.4*  HCT 27.1* 27.7* 26.7*  MCV 79.5 79.8 79.9  PLT 296 313 275    Cardiac Enzymes:  Recent Labs Lab 05/30/15 2109 05/31/15 0105 05/31/15 0501  TROPONINI 0.03 0.04* 0.03    Coagulation:  Recent Labs Lab  05/30/15 1250 05/31/15 0501  INR 5.68* 1.67*    Chest x-ray 05/30/2015: FINDINGS: Mild right basilar airspace disease which may reflect scarring versus atelectasis. There is no other focal parenchymal opacity. There is no pleural effusion or pneumothorax. There is stable cardiomegaly. There is evidence of prior CABG.  The osseous structures are unremarkable.  IMPRESSION: 1. Mild right basilar airspace disease which may reflect scarring versus atelectasis.   Medications:   Scheduled Medications: . insulin aspart  0-15 Units Subcutaneous TID WC  . insulin aspart  0-5 Units Subcutaneous QHS  . pregabalin  50 mg Oral BID     PRN Medications:  acetaminophen **OR** acetaminophen, ondansetron **OR** ondansetron (ZOFRAN) IV, oxyCODONE-acetaminophen   Assessment:   1. Severe anemia with initial hemoglobin 5.5, now up to 8.4 after PRBCs. GI bleeding is suspected with reported intermittent dark stools suggestive of melanoma. Gastroenterology evaluation is underway with plan for colonoscopy and potentially capsule study if needed.  2. 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 Coumadin is on hold pending further gastroenterology evaluation.  3. Recent chest pain in the setting of severe anemia. Cardiac markers argue against ACS. ECG without acute ST segment changes. Plan is to continue medical  therapy and observation for now.  4. CAD status post CABG, last coronary intervention was DES to the SVG to RCA in September 2015. No longer on Plavix.  5. 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   Plan/Discussion:    Continuing to hold Coumadin for now pending further gastroenterology evaluation with colonoscopy this week. He is otherwise not on any of his standing cardiac medications - blood pressure and heart rate are relatively stable, but will add back some of  his regimen and titrate as needed (Toprol XL, Altace , and Crestor - some at reduced dose).   Satira Sark, M.D., F.A.C.C.

## 2015-05-31 NOTE — Patient Outreach (Signed)
[facility]) Care Management  05/31/2015  Tanner Pena [birth year] [id number]   Referral from June, 2016 Heart Failure readmission report, assigned Tanner Amor, RN to outreach for [name] Management services.  Thanks, Ronnell Freshwater. [place], [place] Assistant Phone: [phone] Fax: [phone]

## 2015-05-31 NOTE — Patient Outreach (Signed)
[facility]) Care Management  05/31/2015  Tanner Pena 02/26/1941 [id number]   Referral received, patient currently in ICU at [facility]. Plan to follow up for [facility] CM community after discharge home. Royetta Crochet. Laymond Purser, RN, BSN, [place] [phone]

## 2015-05-31 NOTE — Progress Notes (Signed)
Subjective:  Feels better today. No further chest pain. One black stool yesterday since admission. Denies abdominal pain since SMA stenting. Just feels weak/fatigued. Single episode of emesis last night per nursing staff but none today. No reported hematemesis. Patient denied nausea this morning.   Objective: Vital signs in last 24 hours: Temp:  [97.9 F (36.6 C)-99.5 F (37.5 C)] 98.3 F (36.8 C) (10/12 0759) Pulse Rate:  [29-90] 64 (10/12 0500) Resp:  [15-26] 20 (10/12 0500) BP: (97-163)/(22-86) 122/27 mmHg (10/12 0415) SpO2:  [96 %-100 %] 97 % (10/12 0500) Weight:  [202 lb 2.6 oz (91.7 kg)-215 lb (97.523 kg)] 208 lb 12.4 oz (94.7 kg) (10/12 0500) Last BM Date: 05/30/15 General:   Alert,  Well-developed, well-nourished, pleasant and cooperative in NAD Head:  Normocephalic and atraumatic. Eyes:  Sclera clear, no icterus.  Abdomen:  Soft, nontender and nondistended.  Normal bowel sounds, without guarding, and without rebound.   Extremities:  No edema RLE. 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 Neurologic:  Alert and  oriented x4;  grossly normal neurologically. Skin:  Intact without significant lesions or rashes. Psych:  Alert and cooperative. Normal mood and affect.  Intake/Output from previous day: 10/11 0701 - 10/12 0700 In: 2109.5 [I.V.:750; Blood:1359.5] Out: 1625 [Urine:1625] Intake/Output this shift:    Lab Results: CBC  Recent Labs  05/30/15 1158 05/31/15 0105 05/31/15 0501  WBC 12.0* 10.4 11.6*  HGB 5.5* 8.5* 8.6*  HCT 18.6* 27.1* 27.7*  MCV 75.3* 79.5 79.8  PLT 365 296 313   BMET  Recent Labs  05/30/15 1043 05/30/15 1158 05/31/15 0501  NA 137 139 139  K 4.3 4.3 4.2  CL 109 110 113*  CO2 '23 23 22  '$ GLUCOSE 239* 161* 130*  BUN 63* 60* 35*  CREATININE 1.74* 1.65* 1.14  CALCIUM 8.3* 8.3* 8.8*   LFTs  Recent Labs  05/30/15 1158 05/31/15 0501  BILITOT 0.4 0.8  ALKPHOS 21* 21*  AST 19 18  ALT 10* 9*  PROT 6.3* 6.1*  ALBUMIN 3.3* 3.1*   No results for input(s):  LIPASE in the last 72 hours. PT/INR  Recent Labs  05/30/15 1250 05/31/15 0501  LABPROT 48.9* 19.7*  INR 5.68* 1.67*      Imaging Studies: Dg Chest Portable 1 View  05/30/2015  CLINICAL DATA:  EKG changes, hypertension, diabetes EXAM: PORTABLE CHEST 1 VIEW COMPARISON:  02/04/2015 FINDINGS: Mild right basilar airspace disease which may reflect scarring versus atelectasis. There is no other focal parenchymal opacity. There is no pleural effusion or pneumothorax. There is stable cardiomegaly. There is evidence of prior CABG. The osseous structures are unremarkable. IMPRESSION: 1. Mild right basilar airspace disease which may reflect scarring versus atelectasis. Electronically Signed   By: Kathreen Devoid   On: 05/30/2015 12:12  [2 weeks]   Assessment: 74 year old pleasant male, poor historian, well known to our practice with an extensive medical history as outlined above, multiple comorbidities, and need for chronic anticoagulation in the setting of prosthetic valve and recent vascular stent for SMA stenosis, presenting with profound anemia/occult GI bleed. Chest pain likely secondary to demand ischemia. Last EGD fairly recent by Dr. Michail Sermon February 09, 2015 during similar presentation (IDA, profound anemia) in [place], and this was unrevealing. Last colonoscopy in 2012 by Dr. [name] with ascending colon ischemic ulcers. No capsule study has been completed to date.   Known history of IDA and intolerant to oral iron. With antiplatelet and anticoagulation therapy, he is at risk for bleeding anywhere in the GI tract. Possible  melena approximately 2 weeks ago without further evidence of overt GI bleeding until yesterday. As last EGD is up-to-date, would favor colonoscopy first to rule out lower GI source, and I anticipate a capsule study in the future as well. Situation complicated by need for chronic anticoagulation. Currently, INR is subtherapeutic. No overt GI bleeding in over 12 hours. Go ahead given  by cardiology to GI procedures and to hold anticoagulation until work up complete over the next couple of days.    Dysphagia: vague reports per patient. Would evaluate with BPE as outpatient. Poor fitting dentures may exacerbate.   Plan: 1. Colonoscopy with possible capsule study tomorrow. Will require deep sedation in OR due to polypharmacy.  I have discussed the risks, alternatives, benefits with regards to but not limited to the risk of reaction to medication, bleeding, infection, perforation and the patient is agreeable to proceed. Written consent to be obtained. 2. Clear liquids today. 3. Add PPI empirically. 4. Continue to hold coumadin.  Laureen Ochs. Bernarda Caffey [facility] [phone] 10/12/20169:29 AM     LOS: 1 day

## 2015-05-31 NOTE — Care Management Note (Signed)
Case Management Note  Patient Details  Name: Tanner Pena MRN: [id number] Date of Birth: [birth year]  Expected Discharge Date:     06/03/2015             Expected Discharge Plan:  Home/Self Care  In-House Referral:  NA  Discharge planning Services  CM Consult  Post Acute Care Choice:  NA Choice offered to:  NA  DME Arranged:    DME Agency:     HH Arranged:    HH Agency:     Status of Service:  In process, will continue to follow  Medicare Important Message Given:    Date Medicare IM Given:    Medicare IM give by:    Date Additional Medicare IM Given:    Additional Medicare Important Message give by:     If discussed at [place] of Stay Meetings, dates discussed:    Additional Comments: Pt is from home with strong family support. Pt admitted with GI bleed. Pt has a BKA and has all necessary DME at home. Pt ind at baseline. Pt plans to return home with self care at DC. No CM needs anticipated, will cont to follow.  Sherald Barge, RN 05/31/2015, 1:42 PM

## 2015-06-01 ENCOUNTER — Inpatient Hospital Stay (HOSPITAL_COMMUNITY): Payer: Medicare Other | Admitting: Anesthesiology

## 2015-06-01 ENCOUNTER — Ambulatory Visit (HOSPITAL_COMMUNITY): Admission: RE | Admit: 2015-06-01 | Payer: Medicare Other | Source: Ambulatory Visit | Admitting: Internal Medicine

## 2015-06-01 ENCOUNTER — Encounter (HOSPITAL_COMMUNITY)
Admission: EM | Disposition: A | Discharge status: Home / Self Care | Payer: Self-pay | Source: Home / Self Care | Attending: Internal Medicine

## 2015-06-01 ENCOUNTER — Encounter (HOSPITAL_COMMUNITY): Payer: Self-pay | Admitting: *Deleted

## 2015-06-01 ENCOUNTER — Encounter (HOSPITAL_COMMUNITY): Admission: RE | Payer: Self-pay | Source: Ambulatory Visit

## 2015-06-01 ENCOUNTER — Encounter: Payer: Self-pay | Admitting: *Deleted

## 2015-06-01 ENCOUNTER — Telehealth: Payer: Self-pay | Admitting: Gastroenterology

## 2015-06-01 DIAGNOSIS — K5790 Diverticulosis of intestine, part unspecified, without perforation or abscess without bleeding: Secondary | ICD-10-CM | POA: Diagnosis present

## 2015-06-01 DIAGNOSIS — J449 Chronic obstructive pulmonary disease, unspecified: Secondary | ICD-10-CM | POA: Insufficient documentation

## 2015-06-01 DIAGNOSIS — E1122 Type 2 diabetes mellitus with diabetic chronic kidney disease: Secondary | ICD-10-CM | POA: Diagnosis present

## 2015-06-01 DIAGNOSIS — N181 Chronic kidney disease, stage 1: Secondary | ICD-10-CM

## 2015-06-01 DIAGNOSIS — N179 Acute kidney failure, unspecified: Secondary | ICD-10-CM

## 2015-06-01 DIAGNOSIS — J438 Other emphysema: Secondary | ICD-10-CM

## 2015-06-01 DIAGNOSIS — D509 Iron deficiency anemia, unspecified: Secondary | ICD-10-CM

## 2015-06-01 HISTORY — PX: COLONOSCOPY WITH PROPOFOL: SHX5780

## 2015-06-01 LAB — BASIC METABOLIC PANEL
Anion gap: 7 (ref 5–15)
BUN: 22 mg/dL — AB (ref 6–20)
CALCIUM: 8.6 mg/dL — AB (ref 8.9–10.3)
CO2: 22 mmol/L (ref 22–32)
CREATININE: 1.11 mg/dL (ref 0.61–1.24)
Chloride: 109 mmol/L (ref 101–111)
GFR calc Af Amer: >60 mL/min (ref >60)
GFR calc non Af Amer: >60 mL/min (ref >60)
Glucose, Bld: 101 mg/dL — ABNORMAL HIGH (ref 65–99)
POTASSIUM: 3.7 mmol/L (ref 3.5–5.1)
SODIUM: 138 mmol/L (ref 135–145)

## 2015-06-01 LAB — GLUCOSE, CAPILLARY
GLUCOSE-CAPILLARY: 85 mg/dL (ref 65–99)
GLUCOSE-CAPILLARY: 95 mg/dL (ref 65–99)
GLUCOSE-CAPILLARY: 99 mg/dL (ref 65–99)
GLUCOSE-CAPILLARY: 153 mg/dL — AB (ref 65–99)
Glucose-Capillary: 109 mg/dL — ABNORMAL HIGH (ref 65–99)
Glucose-Capillary: 134 mg/dL — ABNORMAL HIGH (ref 65–99)

## 2015-06-01 LAB — PROTIME-INR
INR: 1.62 — ABNORMAL HIGH (ref 0.00–1.49)
Prothrombin Time: 19.3 seconds — ABNORMAL HIGH (ref 11.6–15.2)

## 2015-06-01 LAB — CBC
HCT: 26.1 % — ABNORMAL LOW (ref 39.0–52.0)
Hemoglobin: 8.1 g/dL — ABNORMAL LOW (ref 13.0–17.0)
MCH: 24.8 pg — AB (ref 26.0–34.0)
MCHC: 31 g/dL (ref 30.0–36.0)
MCV: 80.1 fL (ref 78.0–100.0)
PLATELETS: 277 10*3/uL (ref 150–400)
RBC: 3.26 MIL/uL — AB (ref 4.22–5.81)
RDW: 18.3 % — ABNORMAL HIGH (ref 11.5–15.5)
WBC: 11.5 10*3/uL — ABNORMAL HIGH (ref 4.0–10.5)

## 2015-06-01 SURGERY — COLONOSCOPY WITH PROPOFOL
Anesthesia: Monitor Anesthesia Care

## 2015-06-01 MED ORDER — WATER FOR IRRIGATION, STERILE IR SOLN
Status: DC | PRN
  Administered 2015-06-01: 1000 mL

## 2015-06-01 MED ORDER — WARFARIN - PHARMACIST DOSING INPATIENT
Freq: Every day | Status: DC
Start: 2015-06-01 — End: 2015-06-02

## 2015-06-01 MED ORDER — GLYCOPYRROLATE 0.2 MG/ML IJ SOLN
0.2000 mg | Freq: Once | INTRAMUSCULAR | Status: AC
  Administered 2015-06-01: 0.2 mg via INTRAVENOUS
  Filled 2015-06-01: qty 1

## 2015-06-01 MED ORDER — ONDANSETRON HCL 4 MG/2ML IJ SOLN: 4.0000 mg | Freq: Once | INTRAMUSCULAR | Status: DC | PRN

## 2015-06-01 MED ORDER — FENTANYL CITRATE (PF) 100 MCG/2ML IJ SOLN
25.0000 ug | INTRAMUSCULAR | Status: AC
  Administered 2015-06-01 (×2): 25 ug via INTRAVENOUS

## 2015-06-01 MED ORDER — EPINEPHRINE HCL 0.1 MG/ML IJ SOSY
PREFILLED_SYRINGE | INTRAMUSCULAR | Status: AC
  Filled 2015-06-01: qty 10

## 2015-06-01 MED ORDER — STERILE WATER FOR IRRIGATION IR SOLN
Status: DC | PRN
  Administered 2015-06-01: 1000 mL

## 2015-06-01 MED ORDER — LIDOCAINE HCL (CARDIAC) 10 MG/ML IV SOLN
INTRAVENOUS | Status: DC | PRN
  Administered 2015-06-01: 50 mg via INTRAVENOUS

## 2015-06-01 MED ORDER — FENTANYL CITRATE (PF) 100 MCG/2ML IJ SOLN: 25.0000 ug | INTRAMUSCULAR | Status: DC | PRN

## 2015-06-01 MED ORDER — ATROPINE SULFATE 0.4 MG/ML IJ SOLN
INTRAMUSCULAR | Status: DC | PRN
  Administered 2015-06-01: 0.4 mg via INTRAVENOUS

## 2015-06-01 MED ORDER — PROPOFOL 500 MG/50ML IV EMUL
INTRAVENOUS | Status: DC | PRN
  Administered 2015-06-01: 125 ug/kg/min via INTRAVENOUS

## 2015-06-01 MED ORDER — SODIUM CHLORIDE 0.9 % IV SOLN: INTRAVENOUS | Status: DC

## 2015-06-01 MED ORDER — MIDAZOLAM HCL 2 MG/2ML IJ SOLN
1.0000 mg | INTRAMUSCULAR | Status: DC | PRN
  Administered 2015-06-01: 2 mg via INTRAVENOUS

## 2015-06-01 MED ORDER — LACTATED RINGERS IV SOLN
INTRAVENOUS | Status: DC
  Administered 2015-06-01: 09:00:00 via INTRAVENOUS

## 2015-06-01 MED ORDER — WARFARIN SODIUM 5 MG PO TABS
5.0000 mg | ORAL_TABLET | Freq: Once | ORAL | Status: AC
  Administered 2015-06-01: 5 mg via ORAL
  Filled 2015-06-01: qty 1

## 2015-06-01 SURGICAL SUPPLY — 23 items
ELECT REM PT RETURN 9FT ADLT (ELECTROSURGICAL)
ELECTRODE REM PT RTRN 9FT ADLT (ELECTROSURGICAL) IMPLANT
FCP BXJMBJMB 240X2.8X (CUTTING FORCEPS)
FLOOR PAD 36X40 (MISCELLANEOUS)
FORCEPS BIOP RAD 4 LRG CAP 4 (CUTTING FORCEPS) IMPLANT
FORCEPS BIOP RJ4 240 W/NDL (CUTTING FORCEPS)
FORCEPS BXJMBJMB 240X2.8X (CUTTING FORCEPS) IMPLANT
FORMALIN 10 PREFIL 20ML (MISCELLANEOUS) IMPLANT
INJECTOR/SNARE I SNARE (MISCELLANEOUS) IMPLANT
KIT ENDO PROCEDURE PEN (KITS) ×3 IMPLANT
MANIFOLD NEPTUNE II (INSTRUMENTS) ×2 IMPLANT
NDL SCLEROTHERAPY 25GX240 (NEEDLE) IMPLANT
NEEDLE SCLEROTHERAPY 25GX240 (NEEDLE) IMPLANT
PAD FLOOR 36X40 (MISCELLANEOUS) IMPLANT
PROBE APC STR FIRE (PROBE) IMPLANT
PROBE INJECTION GOLD (MISCELLANEOUS)
PROBE INJECTION GOLD 7FR (MISCELLANEOUS) IMPLANT
SNARE ROTATE MED OVAL 20MM (MISCELLANEOUS) IMPLANT
SNARE SHORT THROW 13M SML OVAL (MISCELLANEOUS) IMPLANT
SYR 50ML LL SCALE MARK (SYRINGE) ×2 IMPLANT
TRAP SPECIMEN MUCOUS 40CC (MISCELLANEOUS) IMPLANT
TUBING IRRIGATION ENDOGATOR (MISCELLANEOUS) ×2 IMPLANT
WATER STERILE IRR 1000ML POUR (IV SOLUTION) ×2 IMPLANT

## 2015-06-01 NOTE — Consult Note (Signed)
   [facility] CM Inpatient Consult   06/01/2015  COSTAS SENA 1941/06/04 [id number]   Spoke with patient at bedside regarding the restart of services with [place] [facility]. Patient verbalized interest in [place] Management services. Patient signed consent form and packet with information given for disease management support or other services. Patient will receive post hospital follow up calls and be assessed for home visits. Of note, [facility] Care Management services does not replace or interfere with any services that are arranged by inpatient case management or social work. For questions, please contact:  Royetta Crochet. Laymond Purser, RN, BSN, [place] [facility] Liaison [phone]

## 2015-06-01 NOTE — Progress Notes (Signed)
PT HAS HAD LARGE TAP WATER ENEMA TIL CLEAR GOLDEN FLUID RETURNED W/O ANY SOLID STOOL.

## 2015-06-01 NOTE — Progress Notes (Signed)
Discussed with nursing this morning, Tanner Pena. Patient completed four liters of Golytely all last night before 9pm. Does not appear that he received split dose as ordered. Tap water enema with clear yellow return this morning.

## 2015-06-01 NOTE — Op Note (Signed)
[facility] [address] [place], [address]   COLONOSCOPY PROCEDURE REPORT  PATIENT: Tanner Pena, Tanner Pena  MR#: [id number] BIRTHDATE: March 26, 1941 , 24  yrs. old GENDER: male ENDOSCOPIST: R.  Garfield Cornea, MD FACP [facility] REFERRED EX:[id number] Gerarda Fraction, M.D. PROCEDURE DATE:  06/05/2015 PROCEDURE:   Ileo-colonoscopy, diagnostic INDICATIONS:iron deficiency anemia; history of colonic ulcers; chronic anticoagulation; negative EGD elsewhere June 2016 MEDICATIONS: Deep sedation per Dr.  [facility] ASA CLASS:       Class III  CONSENT: The risks, benefits, alternatives and imponderables including but not limited to bleeding, perforation as well as the possibility of a missed lesion have been reviewed.  The potential for biopsy, lesion removal, etc. have also been discussed. Questions have been answered.  All parties agreeable.  Please see the history and physical in the medical record for more information.  DESCRIPTION OF PROCEDURE:   After the risks benefits and alternatives of the procedure were thoroughly explained, informed consent was obtained.  The digital rectal exam revealed no abnormalities of the rectum.   The     endoscope was introduced through the anus and advanced to the terminal ileum which was intubated for a short distance. No adverse events experienced. The quality of the prep was adequate  The instrument was then slowly withdrawn as the colon was fully examined. Estimated blood loss is zero unless otherwise noted in this procedure report.      COLON FINDINGS: Normal-appearing rectal mucosa.  Scattered left-sided diverticula; the remainder of the colonic mucosa appeared normal.  The distal 5 cm of terminal ileal mucosa also appeared normal.  Retroflexion was performed. .  Withdrawal time=8 minutes 0 seconds.  The scope was withdrawn and the procedure completed. COMPLICATIONS: There were no immediate complications.  ENDOSCOPIC  IMPRESSION: Colonic diverticulosis; otherwise, normal examination  RECOMMENDATIONS: Will proceed with a capsule study of the small intestine in the near future?"probably as an outpatient. May resume anticoagulation now if the benefits felt to outweigh the risks.  eSigned:  R. Garfield Cornea, MD Rosalita Chessman [facility] 06/05/2015 9:42 AM   cc:  CPT CODES: ICD CODES:  The ICD and CPT codes recommended by this software are interpretations from the data that the clinical staff has captured with the software.  The verification of the translation of this report to the ICD and CPT codes and modifiers is the sole responsibility of the health care institution and practicing physician where this report was generated.  [facility]. will not be held responsible for the validity of the ICD and CPT codes included on this report.  AMA assumes no liability for data contained or not contained herein. CPT is a Designer, television/film set of the [employer].  PATIENT NAME:  Alek, Borges MR#: [id number]

## 2015-06-01 NOTE — Progress Notes (Signed)
[facility] PROGRESS NOTE  Tanner Pena QPR:[id number] DOB: 09/10/1940 DOA: 05/30/2015 PCP: [name], MD    Code Status: Full code Family Communication: Discussed with patient; family not available Disposition Plan: discharge to home when clinically appropriate    Consultants:  Gastroenterology  Cardiology  Procedures: Colonoscopy, Dr. [name], 05/31/15:  "Normal-appearing rectal mucosa. Scattered left-sided diverticula; the remainder of the colonic mucosa appeared normal. The distal 5 cm of terminal ileal mucosa also appeared normal. Retroflexion was performed."  Antibiotics:  None   HPI/Subjective: Patient is recently back from colonoscopy. He has no chest pain, shortness of breath, palpitations, abdominal pain, or recent bloody stools.  Objective: Filed Vitals:   06/01/15 1622  BP: 132/44  Pulse: 53  Temp: 98.2 F (36.8 C)  Resp: 20   oxygen saturation 100% on room air.   Intake/Output Summary (Last 24 hours) at 06/01/15 1646 Last data filed at 06/01/15 1300  Gross per 24 hour  Intake   1740 ml  Output    350 ml  Net   1390 ml   Filed Weights   05/31/15 0500 06/01/15 0500 06/01/15 9935  Weight: 94.7 kg (208 lb 12.4 oz) 91.6 kg (201 lb 15.1 oz) 91.173 kg (201 lb)    Exam:   General:  Pleasant alert 74 year old man laying in bed, in no acute distress.  Cardiovascular: S1, S2, with a soft systolic murmur and an S2 click  Respiratory: Mostly clear, otherwise breathing nonlabored  Abdomen: Positive bowel sounds, soft, nontender, nondistended.  Musculoskeletal: Left lower extremity BKA stump intact and without edema. Right lower extremity without edema.  Neurologic: He is alert and oriented 2. Cranial nerves II through XII are grossly intact.   Data Reviewed: Basic Metabolic Panel:  Recent Labs Lab 05/30/15 1043 05/30/15 1158 05/31/15 0501 06/01/15 0204  NA 137 139 139 138  K 4.3 4.3 4.2 3.7  CL 109 110 113* 109  CO2  '23 23 22 22  '$ GLUCOSE 239* 161* 130* 101*  BUN 63* 60* 35* 22*  CREATININE 1.74* 1.65* 1.14 1.11  CALCIUM 8.3* 8.3* 8.8* 8.6*   Liver Function Tests:  Recent Labs Lab 05/30/15 1158 05/31/15 0501  AST 19 18  ALT 10* 9*  ALKPHOS 21* 21*  BILITOT 0.4 0.8  PROT 6.3* 6.1*  ALBUMIN 3.3* 3.1*   No results for input(s): LIPASE, AMYLASE in the last 168 hours. No results for input(s): AMMONIA in the last 168 hours. CBC:  Recent Labs Lab 05/30/15 1158 05/31/15 0105 05/31/15 0501 05/31/15 0808 05/31/15 1711 06/01/15 0204  WBC 12.0* 10.4 11.6* 12.3* 13.8* 11.5*  NEUTROABS 9.6*  --   --   --   --   --   HGB 5.5* 8.5* 8.6* 8.4* 9.1* 8.1*  HCT 18.6* 27.1* 27.7* 26.7* 29.6* 26.1*  MCV 75.3* 79.5 79.8 79.9 80.9 80.1  PLT 365 296 313 275 319 277   Cardiac Enzymes:  Recent Labs Lab 05/30/15 1158 05/30/15 2109 05/31/15 0105 05/31/15 0501  TROPONINI 0.03 0.03 0.04* 0.03   BNP (last 3 results)  Recent Labs  12/28/14 0134 02/04/15 0413  BNP 504.3* 593.2*    ProBNP (last 3 results) No results for input(s): PROBNP in the last 8760 hours.  CBG:  Recent Labs Lab 05/31/15 2137 06/01/15 0717 06/01/15 0816 06/01/15 0947 06/01/15 1138  GLUCAP 113* 85 95 109* 99    Recent Results (from the past 240 hour(s))  MRSA PCR Screening     Status: None   Collection Time: 05/30/15  4:33 PM  Result Value Ref Range Status   MRSA by PCR NEGATIVE NEGATIVE Final    Comment:        The GeneXpert MRSA Assay (FDA approved for NASAL specimens only), is one component of a comprehensive MRSA colonization surveillance program. It is not intended to diagnose MRSA infection nor to guide or monitor treatment for MRSA infections.      Studies: No results found.  Scheduled Meds: . insulin aspart  0-15 Units Subcutaneous TID WC  . insulin aspart  0-5 Units Subcutaneous QHS  . metoprolol succinate  25 mg Oral Daily  . pantoprazole  40 mg Oral BID AC  . pregabalin  50 mg Oral BID   . ramipril  2.5 mg Oral Daily  . rosuvastatin  20 mg Oral Daily   Continuous Infusions:   Assessment and plan: Principal Problem:   GI bleeding Active Problems:   Acute blood loss anemia   Anemia, iron deficiency   Diverticulosis   Warfarin-induced coagulopathy (HCC)   Status post mechanical aortic valve replacement   Acute kidney injury ([place])   Coronary artery disease involving native coronary artery of native heart with angina pectoris (HCC)   DM2 (diabetes mellitus, type 2) (HCC)   COPD (chronic obstructive pulmonary disease) (HCC)   S/P below knee amputation (HCC)   Mesenteric ischemia, chronic (HCC)    1. GI bleeding in the setting of anticoagulation. Patient reported melena, but not hematemesis or hematochezia. Coumadin was held on admission. He has denied having any further bloody stool or melanotic stool since admission. -GI was consulted and started PPI empirically. -Patient underwent a colonoscopy on 10/12 and it revealed colonic diverticulosis, otherwise normal exam. -GI plans to proceed with a capsule study of the small intestine in the near future, probably as an outpatient. -Recommendations to start anticoagulation can be restarted if the benefits outweigh the risk, per Dr. [name] Will confer with cardiology again, but will start Coumadin this evening.  Acute on blood loss anemia, in the setting of anticoagulation. Patient's hemoglobin was 5.5 on admission. He was transfused 3 units of packed red blood cells. His hemoglobin has improved appropriately. -We'll continue to monitor.  Warfarin-induced coagulopathy. The patient is treated with warfarin chronically for aortic valve replacement. His INR was 5.68 on admission. He was given vitamin K. Coumadin was held for the colonoscopy. His INR is now subtherapeutic. -We'll restart Coumadin as the benefit appears to outweigh the risk given his mechanical aortic valve, but will confer with cardiology again.   Acute  kidney injury. The patient has no history of chronic kidney disease. His creatinine was 1.74 on admission. Following gentle IV fluids and packed red blood cell transfusion, his creatinine has normalized.  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 -As above, Coumadin was held on admission for supratherapeutic INR and pending colonoscopy. Will restart Coumadin cautiously.  Recent chest pain in the setting of severe anemia. Patient's troponin I was marginally elevated, but has normalized. His EKG reveals no acute ST or T-wave changes. - denies chest pain currently. -He does have a history of CAD, status post CABG and stenting. He is being continued on Toprol XL, Crestor, ramipril. ? On fenofibrate and isosorbide mononitrate. -Cardiology was consulted and is following.  History of mesenteric ischemia, chronic; status post stenting of the proximal SMA and July 2016. -Stable.  Type 2 diabetes mellitus. Patient is treated with Amaryl and Januvia  chronically; both are being held . We'll continue  sliding scale NovoLog. His CBGs are reasonable. Hemoglobin A1c pending.   COPD. Patient has occasional wheezes on exam, but no evidence of COPD exacerbation.  Xopenex nebulizer as needed was ordered.       Time spent: 40 minutes.    [facility] Pager [phone]. If 7PM-7AM, please contact night-coverage at [email or web address], password [facility] 06/01/2015, 4:46 PM  LOS: 2 days

## 2015-06-01 NOTE — Telephone Encounter (Signed)
Patient needs small bowel capsule endoscopy first of the week per Dr. [name]. Dx: obscure GI bleeding, anemia

## 2015-06-01 NOTE — Progress Notes (Signed)
Primary cardiologist: Dr. [name]  Seen for followup: History of St. Jude AVR, GI bleed  Subjective:    Tolerated colonoscopy this morning. No shortness of breath or chest pain.  Objective:   Temp:  [97.8 F (36.6 C)-98.7 F (37.1 C)] 97.8 F (36.6 C) (10/13 0935) Pulse Rate:  [29-65] 53 (10/13 1051) Resp:  [11-34] 18 (10/13 1051) BP: (85-167)/(29-72) 142/36 mmHg (10/13 1051) SpO2:  [91 %-100 %] 99 % (10/13 1051) Weight:  [201 lb (91.173 kg)-201 lb 15.1 oz (91.6 kg)] 201 lb (91.173 kg) (10/13 0822) Last BM Date: 06/01/15 (large tap water enema given w/ clear gold liquid return)  Filed Weights   05/31/15 0500 06/01/15 0500 06/01/15 0822  Weight: 208 lb 12.4 oz (94.7 kg) 201 lb 15.1 oz (91.6 kg) 201 lb (91.173 kg)    Intake/Output Summary (Last 24 hours) at 06/01/15 1130 Last data filed at 06/01/15 0925  Gross per 24 hour  Intake   1980 ml  Output    350 ml  Net   1630 ml    Telemetry: Sinus rhythm with PVCs.  Exam:  General: No acute distress.  Lungs: Clear, nonlabored.  Cardiac: RRR with ectopic beats consistent with PVCs and mechanical click in S2.  Abdomen: Protuberant, decreased bowel sounds.  Extremities: No pitting edema.  Lab Results:  Basic Metabolic Panel:  Recent Labs Lab 05/30/15 1158 05/31/15 0501 06/01/15 0204  NA 139 139 138  K 4.3 4.2 3.7  CL 110 113* 109  CO2 '23 22 22  '$ GLUCOSE 161* 130* 101*  BUN 60* 35* 22*  CREATININE 1.65* 1.14 1.11  CALCIUM 8.3* 8.8* 8.6*    Liver Function Tests:  Recent Labs Lab 05/30/15 1158 05/31/15 0501  AST 19 18  ALT 10* 9*  ALKPHOS 21* 21*  BILITOT 0.4 0.8  PROT 6.3* 6.1*  ALBUMIN 3.3* 3.1*    CBC:  Recent Labs Lab 05/31/15 0808 05/31/15 1711 06/01/15 0204  WBC 12.3* 13.8* 11.5*  HGB 8.4* 9.1* 8.1*  HCT 26.7* 29.6* 26.1*  MCV 79.9 80.9 80.1  PLT 275 319 277    Cardiac Enzymes:  Recent Labs Lab 05/30/15 2109 05/31/15 0105 05/31/15 0501  TROPONINI 0.03 0.04* 0.03     Coagulation:  Recent Labs Lab 05/30/15 1250 05/31/15 0501 06/01/15 0204  INR 5.68* 1.67* 1.62*    Chest x-ray 05/30/2015: FINDINGS: Mild right basilar airspace disease which may reflect scarring versus atelectasis. There is no other focal parenchymal opacity. There is no pleural effusion or pneumothorax. There is stable cardiomegaly. There is evidence of prior CABG.  The osseous structures are unremarkable.  IMPRESSION: 1. Mild right basilar airspace disease which may reflect scarring versus atelectasis.   Medications:   Scheduled Medications: . EPINEPHrine      . insulin aspart  0-15 Units Subcutaneous TID WC  . insulin aspart  0-5 Units Subcutaneous QHS  . metoprolol succinate  25 mg Oral Daily  . pantoprazole  40 mg Oral BID AC  . pregabalin  50 mg Oral BID  . ramipril  2.5 mg Oral Daily  . rosuvastatin  20 mg Oral Daily    PRN Medications: acetaminophen **OR** acetaminophen, levalbuterol, ondansetron **OR** ondansetron (ZOFRAN) IV, oxyCODONE-acetaminophen   Assessment:   1. Severe anemia with initial hemoglobin 5.5, now 8.1 after PRBCs. GI bleeding is suspected with reported intermittent dark stools suggestive of melanoma. Colonoscopy showed diverticulosis but otherwise normal examination, at this point a capsule study of the small bowel is planned.  2.  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 Coumadin is on hold pending further gastroenterology evaluation.  3. Recent chest pain in the setting of severe anemia. Cardiac markers argue against ACS. ECG without acute ST segment changes. Plan is to continue medical therapy and observation for now.  4. CAD status post CABG, last coronary intervention was DES to the SVG to RCA in September 2015. No longer on Plavix.  5. 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   Plan/Discussion:     Continuing to hold Coumadin for now - await guidance from gastroenterology in terms of when we might be able to resume. Continue Toprol-XL, Altace, and Crestor.   Tanner Pena, M.D., F.A.C.C.

## 2015-06-01 NOTE — Progress Notes (Signed)
ANTICOAGULATION CONSULT NOTE - Initial Consult  Pharmacy Consult for Coumadin Indication: Mechanical Aortic Valve (St. Jude)  No Known Allergies  Patient Measurements: Height: '5\' 10"'$  (177.8 cm) Weight: 201 lb (91.173 kg) IBW/kg (Calculated) : 73 Heparin Dosing Weight:   Vital Signs: Temp: 98.2 F (36.8 C) (10/13 1622) Temp Source: Oral (10/13 1622) BP: 132/44 mmHg (10/13 1622) Pulse Rate: 53 (10/13 1622)  Labs:  Recent Labs  05/30/15 1158 05/30/15 1250 05/30/15 2109 05/31/15 0105 05/31/15 0501 05/31/15 0808 05/31/15 1711 06/01/15 0204  HGB 5.5*  --   --  8.5* 8.6* 8.4* 9.1* 8.1*  HCT 18.6*  --   --  27.1* 27.7* 26.7* 29.6* 26.1*  PLT 365  --   --  296 313 275 319 277  LABPROT  --  48.9*  --   --  19.7*  --   --  19.3*  INR  --  5.68*  --   --  1.67*  --   --  1.62*  CREATININE 1.65*  --   --   --  1.14  --   --  1.11  TROPONINI 0.03  --  0.03 0.04* 0.03  --   --   --     Estimated Creatinine Clearance: 66.3 mL/min (by C-G formula based on Cr of 1.11).   Medical History: Past Medical History  Diagnosis Date  . Type 2 diabetes mellitus ([place]) 2007  . Essential hypertension   . Gout   . Hypercholesteremia   . Peripheral vascular disease ([place])     Lower extremity PCI/stenting  . COPD (chronic obstructive pulmonary disease) ([place])   . S/P aortic valve replacement 1990    a. St. Jude  . Chronic back pain   . Dysphagia   . Neuromuscular disorder ([place])   . Peripheral neuropathy ([place])   . Critical lower limb ischemia   . CAD (coronary artery disease)     a. 05/13/14 [name] s/p overlapping DESx2 to SVG to RCA. b.  s/p CABG '90 with redo '94 & stent to RCA SVG in 2005  . Chronic toe ulcer ([place])     a. Left foot  . Chronic systolic heart failure ([place])   . History of kidney stones   . GERD (gastroesophageal reflux disease)   . Arthritis   . Sleep apnea   . Myocardial infarction [facility])     Medications:  Prescriptions prior to admission  Medication Sig Dispense  Refill Last Dose  . albuterol (PROVENTIL) 4 MG tablet Take 4 mg by mouth 3 (three) times daily.   05/30/2015 at Unknown time  . albuterol-ipratropium (COMBIVENT) 18-103 MCG/ACT inhaler Inhale 1 puff into the lungs 4 (four) times daily. Coughing/ Shortness of Breath   Past Week at Unknown time  . aspirin EC 81 MG EC tablet Take 1 tablet (81 mg total) by mouth daily. 30 tablet 0 05/30/2015 at Unknown time  . cholecalciferol (VITAMIN D) 1000 UNITS tablet Take 2,000 Units by mouth daily.   Past Week at Unknown time  . clopidogrel (PLAVIX) 75 MG tablet Take 1 tablet (75 mg total) by mouth daily. <PLEASE MAKE APPOINTMENT FOR REFILLS> 30 tablet 0 Past Week at Unknown time  . Emollient (EUCERIN) lotion Apply 10 mLs topically as needed for dry skin.   Past Month at Unknown time  . fenofibrate (TRICOR) 145 MG tablet Take 1 tablet (145 mg total) by mouth daily. NEED OV. 30 tablet 0 Past Week at Unknown time  . fish oil-omega-3 fatty acids 1000  MG capsule Take 1 capsule (1 g total) by mouth 2 (two) times daily.   05/30/2015 at Unknown time  . furosemide (LASIX) 40 MG tablet Take 1 tablet (40 mg total) by mouth daily. (Patient taking differently: Take 80 mg by mouth daily. ) 30 tablet 1 05/30/2015 at Unknown time  . glimepiride (AMARYL) 1 MG tablet Take 1 mg by mouth 2 (two) times daily.   05/30/2015 at Unknown time  . isosorbide mononitrate (IMDUR) 30 MG 24 hr tablet Take 1 tablet (30 mg total) by mouth daily. <PLEASE MAKE APPOINTMENT FOR REFILLS> 30 tablet 0 05/30/2015 at Unknown time  . metoprolol succinate (TOPROL XL) 50 MG 24 hr tablet Take 50 mg by mouth daily. Take with or immediately following a meal.   05/30/2015 at Unknown time  . nitroGLYCERIN (NITROSTAT) 0.4 MG SL tablet Place 1 tablet (0.4 mg total) under the tongue every 5 (five) minutes as needed for chest pain. 25 tablet 12 unknown  . oxyCODONE-acetaminophen (PERCOCET) 10-325 MG per tablet Take 1 tablet by mouth every 4 (four) hours as needed for  pain. (Patient taking differently: Take 1 tablet by mouth every 3 (three) hours as needed for pain. ) 30 tablet 0 Past Month at Unknown time  . polyethylene glycol-electrolytes (NULYTELY/GOLYTELY) 420 G solution Take 4,000 mLs by mouth once. 4000 mL 0   . potassium chloride SA (K-DUR,KLOR-CON) 20 MEQ tablet Take 20 mEq by mouth daily.     05/30/2015 at Unknown time  . pregabalin (LYRICA) 50 MG capsule Take one capsule by mouth twice daily for pains (Patient taking differently: Take 50 mg by mouth 3 (three) times daily. ) 60 capsule 5 Past Week at Unknown time  . ramipril (ALTACE) 2.5 MG capsule Take 2.5 mg by mouth at bedtime.    Past Week at Unknown time  . ramipril (ALTACE) 5 MG capsule Take 5 mg by mouth every morning.   05/30/2015 at Unknown time  . rosuvastatin (CRESTOR) 20 MG tablet TAKE (1) TABLET BY MOUTH AT BEDTIME FOR CHOLESTEROL. 30 tablet 0 Past Week at Unknown time  . sitaGLIPtin (JANUVIA) 50 MG tablet Take 50 mg by mouth daily.   05/30/2015 at Unknown time  . vitamin C (ASCORBIC ACID) 500 MG tablet Take 500 mg by mouth daily.   Past Week at Unknown time  . warfarin (COUMADIN) 5 MG tablet Take 1 tablet by mouth daily or as directed by coumadin clinic. (Patient taking differently: Take 5-7.5 mg by mouth See admin instructions. Take one tablet ('5mg'$  total) on all days except on Fridays. Take one and one-half tablet (7.'5mg'$  total)on Fridays) 30 tablet 1 05/30/2015 at Unknown time    Assessment: The patient is treated with warfarin chronically for aortic valve replacement. His INR was 5.68 on admission. He was given vitamin K. Coumadin was held for the colonoscopy. His INR is now subtherapeutic. Coumadin restarted as the benefit appears to outweigh the risk given mechanical aortic valve, but MD will confer with cardiology again.  Goal of Therapy:  INR 2.5 - 3.5 Monitor platelets by anticoagulation protocol: Yes   Plan:  Coumadin 5 mg po x 1 dose tonight INR/PT daily Monitor for  bleeding   Tanner Pena, Tanner Pena 06/01/2015,5:05 PM

## 2015-06-01 NOTE — Progress Notes (Signed)
PT TRANSFERRING TO ROOM 313  WITHIN THE HOUR. PT ALERT AND ORIENTED. VS STABLE. NO SX OF GI BLEEDING. BIL AC NSL PATENT. HR 60'S IN BGBB W/ OCCASSIONAL PVC'S. DENIES ANY DISCOMFORT. TRANSFER REPORT CALLED TO VAL LPN ON 505.

## 2015-06-01 NOTE — Anesthesia Preprocedure Evaluation (Addendum)
Anesthesia Evaluation  Patient identified by MRN, date of birth, ID band Patient awake    Reviewed: Allergy & Precautions, NPO status , Patient's Chart, lab work & pertinent test results  History of Anesthesia Complications Negative for: history of anesthetic complications  Airway Mallampati: II  TM Distance: >3 FB Neck ROM: Full    Dental no notable dental hx. (+) Edentulous Upper, Edentulous Lower, Dental Advisory Given   Pulmonary shortness of breath and with exertion, sleep apnea , pneumonia, COPD,  COPD inhaler, former smoker,    Pulmonary exam normal breath sounds clear to auscultation       Cardiovascular hypertension, Pt. on medications and Pt. on home beta blockers + angina + CAD, + Past MI, + Cardiac Stents, + CABG, + Peripheral Vascular Disease and +CHF  Normal cardiovascular exam+ dysrhythmias + Valvular Problems/Murmurs AS  Rhythm:Regular Rate:Normal     Neuro/Psych negative neurological ROS  negative psych ROS   GI/Hepatic Neg liver ROS, GERD  Medicated and Controlled,  Endo/Other  diabetes, Type 2, Oral Hypoglycemic Agents  Renal/GU Renal disease     Musculoskeletal  (+) Arthritis ,   Abdominal   Peds  Hematology negative hematology ROS (+) anemia ,   Anesthesia Other Findings   Reproductive/Obstetrics negative OB ROS                            Anesthesia Physical Anesthesia Plan  ASA: IV  Anesthesia Plan: MAC   Post-op Pain Management:    Induction: Intravenous  Airway Management Planned: Simple Face Mask  Additional Equipment:   Intra-op Plan:   Post-operative Plan:   Informed Consent: I have reviewed the patients History and Physical, chart, labs and discussed the procedure including the risks, benefits and alternatives for the proposed anesthesia with the patient or authorized representative who has indicated his/her understanding and acceptance.      Plan Discussed with:   Anesthesia Plan Comments:         Anesthesia Quick Evaluation

## 2015-06-01 NOTE — OR Nursing (Signed)
Lurline Del RN endoscopy nurse applied givens belt.

## 2015-06-01 NOTE — Anesthesia Postprocedure Evaluation (Signed)
  Anesthesia Post-op Note  Patient: Tanner Pena  Procedure(s) Performed: Procedure(s): COLONOSCOPY WITH PROPOFOL; IN CECUM AT  0921; WITHDRAWAL TIME 8 MINUTES (N/A)  Patient Location: PACU  Anesthesia Type:MAC  Level of Consciousness: awake, alert  and patient cooperative  Airway and Oxygen Therapy: Patient Spontanous Breathing  Post-op Pain: none  Post-op Assessment: Post-op Vital signs reviewed, Patient's Cardiovascular Status Stable, Respiratory Function Stable and Patent Airway              Post-op Vital Signs: Reviewed and stable    Complications: No apparent anesthesia complications

## 2015-06-01 NOTE — Transfer of Care (Signed)
Immediate Anesthesia Transfer of Care Note  Patient: Tanner Pena  Procedure(s) Performed: Procedure(s): COLONOSCOPY WITH PROPOFOL; IN CECUM AT  0921; WITHDRAWAL TIME 8 MINUTES (N/A)  Patient Location: PACU  Anesthesia Type:MAC  Level of Consciousness: awake, alert  and patient cooperative  Airway & Oxygen Therapy: Patient Spontanous Breathing  Post-op Assessment: Report given to RN, Post -op Vital signs reviewed and stable and Patient moving all extremities  Post vital signs: Reviewed and stable    Complications: No apparent anesthesia complications

## 2015-06-01 NOTE — Anesthesia Procedure Notes (Signed)
Procedure Name: MAC Date/Time: 06/01/2015 9:02 AM Performed by: Vista Deck Pre-anesthesia Checklist: Patient identified, Emergency Drugs available, Suction available, Timeout performed and Patient being monitored Patient Re-evaluated:Patient Re-evaluated prior to inductionOxygen Delivery Method: Non-rebreather mask

## 2015-06-02 ENCOUNTER — Encounter (HOSPITAL_COMMUNITY): Payer: Self-pay | Admitting: Internal Medicine

## 2015-06-02 DIAGNOSIS — K5730 Diverticulosis of large intestine without perforation or abscess without bleeding: Secondary | ICD-10-CM

## 2015-06-02 DIAGNOSIS — J9601 Acute respiratory failure with hypoxia: Secondary | ICD-10-CM

## 2015-06-02 DIAGNOSIS — D500 Iron deficiency anemia secondary to blood loss (chronic): Secondary | ICD-10-CM | POA: Insufficient documentation

## 2015-06-02 DIAGNOSIS — K551 Chronic vascular disorders of intestine: Secondary | ICD-10-CM

## 2015-06-02 DIAGNOSIS — I5043 Acute on chronic combined systolic (congestive) and diastolic (congestive) heart failure: Secondary | ICD-10-CM

## 2015-06-02 LAB — CBC
HEMATOCRIT: 26.6 % — AB (ref 39.0–52.0)
Hemoglobin: 8.1 g/dL — ABNORMAL LOW (ref 13.0–17.0)
MCH: 24.6 pg — AB (ref 26.0–34.0)
MCHC: 30.5 g/dL (ref 30.0–36.0)
MCV: 80.9 fL (ref 78.0–100.0)
PLATELETS: 284 10*3/uL (ref 150–400)
RBC: 3.29 MIL/uL — AB (ref 4.22–5.81)
RDW: 18.8 % — ABNORMAL HIGH (ref 11.5–15.5)
WBC: 8.4 10*3/uL (ref 4.0–10.5)

## 2015-06-02 LAB — BASIC METABOLIC PANEL
ANION GAP: 5 (ref 5–15)
BUN: 20 mg/dL (ref 6–20)
CHLORIDE: 111 mmol/L (ref 101–111)
CO2: 24 mmol/L (ref 22–32)
Calcium: 8.7 mg/dL — ABNORMAL LOW (ref 8.9–10.3)
Creatinine, Ser: 1 mg/dL (ref 0.61–1.24)
GFR calc Af Amer: >60 mL/min (ref >60)
GLUCOSE: 119 mg/dL — AB (ref 65–99)
POTASSIUM: 3.8 mmol/L (ref 3.5–5.1)
Sodium: 140 mmol/L (ref 135–145)

## 2015-06-02 LAB — GLUCOSE, CAPILLARY
GLUCOSE-CAPILLARY: 125 mg/dL — AB (ref 65–99)
GLUCOSE-CAPILLARY: 150 mg/dL — AB (ref 65–99)

## 2015-06-02 LAB — PROTIME-INR
INR: 1.64 — ABNORMAL HIGH (ref 0.00–1.49)
Prothrombin Time: 19.5 seconds — ABNORMAL HIGH (ref 11.6–15.2)

## 2015-06-02 LAB — HEMOGLOBIN A1C
Hgb A1c MFr Bld: 6.2 % — ABNORMAL HIGH (ref 4.8–5.6)
Mean Plasma Glucose: 131 mg/dL

## 2015-06-02 MED ORDER — ISOSORBIDE MONONITRATE ER 60 MG PO TB24
30.0000 mg | ORAL_TABLET | Freq: Every day | ORAL | Status: DC
  Administered 2015-06-02: 30 mg via ORAL
  Filled 2015-06-02: qty 1

## 2015-06-02 MED ORDER — FUROSEMIDE 40 MG PO TABS: 40.0000 mg | ORAL_TABLET | Freq: Every day | ORAL | Status: DC

## 2015-06-02 MED ORDER — FERROUS SULFATE 325 (65 FE) MG PO TBEC: 325.0000 mg | DELAYED_RELEASE_TABLET | Freq: Every day | ORAL | Status: DC

## 2015-06-02 MED ORDER — RAMIPRIL 1.25 MG PO CAPS: 2.5000 mg | ORAL_CAPSULE | Freq: Two times a day (BID) | ORAL | Status: DC

## 2015-06-02 MED ORDER — WARFARIN SODIUM 5 MG PO TABS: 5.0000 mg | ORAL_TABLET | Freq: Once | ORAL | Status: DC

## 2015-06-02 MED ORDER — METOPROLOL SUCCINATE ER 50 MG PO TB24: 50.0000 mg | ORAL_TABLET | Freq: Every day | ORAL | Status: DC

## 2015-06-02 MED ORDER — RAMIPRIL 2.5 MG PO CAPS: 2.5000 mg | ORAL_CAPSULE | Freq: Two times a day (BID) | ORAL | Status: DC

## 2015-06-02 MED ORDER — WARFARIN SODIUM 5 MG PO TABS: ORAL_TABLET | ORAL | Status: DC

## 2015-06-02 NOTE — Care Management Important Message (Signed)
Important Message  Patient Details  Name: Tanner Pena MRN: [id number] Date of Birth: 11/22/1940   Medicare Important Message Given:  Yes-second notification given    Sherald Barge, RN 06/02/2015, 8:31 AM

## 2015-06-02 NOTE — Care Management Note (Addendum)
Case Management Note  Patient Details  Name: OCIEL RETHERFORD MRN: [id number] Date of Birth: 12-Sep-1940  Expected Discharge Date:     06/02/2015             Expected Discharge Plan:  Home/Self Care  In-House Referral:  NA  Discharge planning Services  CM Consult  Post Acute Care Choice:  NA Choice offered to:  NA  DME Arranged:    DME Agency:     HH Arranged:    [place] Agency:     Status of Service:  Completed, signed off  Medicare Important Message Given:  Yes-second notification given Date Medicare IM Given:    Medicare IM give by:    Date Additional Medicare IM Given:    Additional Medicare Important Message give by:     If discussed at [place] of Stay Meetings, dates discussed:    Additional Comments: Anticipate DC home with self care in next 24 hours.  Pt will be active with [facility] at DC. No CM needs noted.  Sherald Barge, RN 06/02/2015, 8:31 AM

## 2015-06-02 NOTE — Progress Notes (Signed)
Primary cardiologist: Dr. [name]  Seen for followup: History of St. Jude AVR, GI bleed  Subjective:    Had an episode of dry heaves overnight, no frank emesis. This morning no particular symptoms, ate breakfast. No chest pain or palpitations.  Objective:   Temp:  [98.2 F (36.8 C)-98.9 F (37.2 C)] 98.2 F (36.8 C) (10/14 0418) Pulse Rate:  [32-60] 54 (10/13 2045) Resp:  [17-20] 20 (10/14 0418) BP: (127-149)/(40-55) 134/55 mmHg (10/14 0418) SpO2:  [99 %-100 %] 100 % (10/14 0418) Last BM Date: 06/01/15  Filed Weights   05/31/15 0500 06/01/15 0500 06/01/15 0822  Weight: 208 lb 12.4 oz (94.7 kg) 201 lb 15.1 oz (91.6 kg) 201 lb (91.173 kg)    Intake/Output Summary (Last 24 hours) at 06/02/15 1053 Last data filed at 06/02/15 0918  Gross per 24 hour  Intake    720 ml  Output    675 ml  Net     45 ml    Telemetry: Sinus rhythm with PVCs.  Exam:  General: No acute distress.  Lungs: Clear, nonlabored.  Cardiac: RRR with ectopic beats consistent with PVCs and mechanical click in S2.  Abdomen: Protuberant, decreased bowel sounds.  Extremities: No pitting edema.  Lab Results:  Basic Metabolic Panel:  Recent Labs Lab 05/31/15 0501 06/01/15 0204 06/02/15 0605  NA 139 138 140  K 4.2 3.7 3.8  CL 113* 109 111  CO2 '22 22 24  '$ GLUCOSE 130* 101* 119*  BUN 35* 22* 20  CREATININE 1.14 1.11 1.00  CALCIUM 8.8* 8.6* 8.7*    Liver Function Tests:  Recent Labs Lab 05/30/15 1158 05/31/15 0501  AST 19 18  ALT 10* 9*  ALKPHOS 21* 21*  BILITOT 0.4 0.8  PROT 6.3* 6.1*  ALBUMIN 3.3* 3.1*    CBC:  Recent Labs Lab 05/31/15 1711 06/01/15 0204 06/02/15 0605  WBC 13.8* 11.5* 8.4  HGB 9.1* 8.1* 8.1*  HCT 29.6* 26.1* 26.6*  MCV 80.9 80.1 80.9  PLT 319 277 284    Cardiac Enzymes:  Recent Labs Lab 05/30/15 2109 05/31/15 0105 05/31/15 0501  TROPONINI 0.03 0.04* 0.03    Coagulation:  Recent Labs Lab 05/31/15 0501 06/01/15 0204 06/02/15 0605    INR 1.67* 1.62* 1.64*    Chest x-ray 05/30/2015: FINDINGS: Mild right basilar airspace disease which may reflect scarring versus atelectasis. There is no other focal parenchymal opacity. There is no pleural effusion or pneumothorax. There is stable cardiomegaly. There is evidence of prior CABG.  The osseous structures are unremarkable.  IMPRESSION: 1. Mild right basilar airspace disease which may reflect scarring versus atelectasis.   Medications:   Scheduled Medications: . insulin aspart  0-15 Units Subcutaneous TID WC  . insulin aspart  0-5 Units Subcutaneous QHS  . metoprolol succinate  25 mg Oral Daily  . pantoprazole  40 mg Oral BID AC  . pregabalin  50 mg Oral BID  . ramipril  2.5 mg Oral Daily  . rosuvastatin  20 mg Oral Daily  . warfarin  5 mg Oral Once  . Warfarin - Pharmacist Dosing Inpatient   Does not apply q1800    PRN Medications: acetaminophen **OR** acetaminophen, levalbuterol, ondansetron **OR** ondansetron (ZOFRAN) IV, oxyCODONE-acetaminophen   Assessment:   1. Severe anemia with initial hemoglobin 5.5, now 8.1 after PRBCs. GI bleeding is suspected with reported intermittent dark stools suggestive of melanoma. Colonoscopy showed diverticulosis but otherwise normal examination, at this point a capsule study of the small bowel is planned.  2. 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 Coumadin has been on hold pending further gastroenterology evaluation.  3. Recent chest pain in the setting of severe anemia. Cardiac markers argue against ACS. ECG without acute ST segment changes. Plan is to continue medical therapy and observation for now.  4. CAD status post CABG, last coronary intervention was DES to the SVG to RCA in September 2015. No longer on Plavix.  5. 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   Plan/Discussion:     Reviewed Dr.Fisher's note from October 13 indicating discussion with Dr. [name] about resuming Coumadin and plan for possible outpatient capsule study. Patient is stable from a cardiac perspective. He typically has INR levels drawn at [facility] and then sent to our [facility] office in [place] for review by the anticoagulation clinic. If he goes home today, he will need to have a follow-up PT/INR checked on Monday. Continue Toprol-XL 25 mg daily, change Altace to 2.5 mg BID, add back Imdur 30 mg daily, and continue Crestor. Doses are somewhat different from his outpatient regimen, however this can be re-titrated depending on his blood pressure response. He needs to follow-up with Dr. [name] or APP in the [facility] office within the next few weeks.   Satira Sark, M.D., F.A.C.C.

## 2015-06-02 NOTE — Progress Notes (Signed)
Subjective: Feeling better today, no noted black stools or other overt sign of GIB. Has brief abdominal pain last night which self-resolved. No other abdominal pain, N/V. Energy better. States he wants to go home. Joking and talkative.  Objective: Vital signs in last 24 hours: Temp:  [98.2 F (36.8 C)-98.9 F (37.2 C)] 98.2 F (36.8 C) (10/14 0418) Pulse Rate:  [53-57] 54 (10/13 2045) Resp:  [20] 20 (10/14 0418) BP: (129-138)/(40-55) 134/55 mmHg (10/14 0418) SpO2:  [100 %] 100 % (10/14 0418) Last BM Date: 06/01/15 General:   Alert and oriented, pleasant Head:  Normocephalic and atraumatic. Eyes:  No icterus, sclera clear. Conjuctiva pink.  Heart:  S1, S2 present, no murmurs noted.  Lungs: Clear to auscultation bilaterally, without wheezing, rales, or rhonchi.  Abdomen:  Bowel sounds present, soft, non-tender, non-distended. No HSM or hernias noted. No rebound or guarding. No masses appreciated  Neurologic:  Alert and  oriented x4;  grossly normal neurologically. Skin:  Warm and dry, intact without significant lesions.  Psych:  Alert and cooperative. Normal mood and affect.  Intake/Output from previous day: 10/13 0701 - 10/14 0700 In: 780 [P.O.:480; I.V.:300] Out: 375 [Urine:375] Intake/Output this shift: Total I/O In: 240 [P.O.:240] Out: 300 [Urine:300]  Lab Results:  Recent Labs  05/31/15 1711 06/01/15 0204 06/02/15 0605  WBC 13.8* 11.5* 8.4  HGB 9.1* 8.1* 8.1*  HCT 29.6* 26.1* 26.6*  PLT 319 277 284   BMET  Recent Labs  05/31/15 0501 06/01/15 0204 06/02/15 0605  NA 139 138 140  K 4.2 3.7 3.8  CL 113* 109 111  CO2 '22 22 24  '$ GLUCOSE 130* 101* 119*  BUN 35* 22* 20  CREATININE 1.14 1.11 1.00  CALCIUM 8.8* 8.6* 8.7*   LFT  Recent Labs  05/31/15 0501  PROT 6.1*  ALBUMIN 3.1*  AST 18  ALT 9*  ALKPHOS 21*  BILITOT 0.8   PT/INR  Recent Labs  06/01/15 0204 06/02/15 0605  LABPROT 19.3* 19.5*  INR 1.62* 1.64*   Hepatitis Panel No results  for input(s): HEPBSAG, HCVAB, HEPAIGM, HEPBIGM in the last 72 hours.   Studies/Results: No results found.  Assessment: 73 year old pleasant male, poor historian, well known to our practice with an extensive medical history as outlined in initial consult note, multiple comorbidities, and need for chronic anticoagulation in the setting of prosthetic valve and recent vascular stent for SMA stenosis, presenting with profound anemia/occult GI bleed. Chest pain likely secondary to demand ischemia. Last EGD fairly recent by Dr. Michail Sermon February 09, 2015 during similar presentation (IDA, profound anemia) in [place], and this was unrevealing. Last colonoscopy in 2012 by Dr. [name] with ascending colon ischemic ulcers. No capsule study has been completed to date.   Known history of IDA and intolerant to oral iron. With antiplatelet and anticoagulation therapy, he is at risk for bleeding anywhere in the GI tract. Possible melena approximately 2 weeks ago without further evidence of overt GI bleeding until yesterday. EGD is up-to-date. Situation complicated by need for chronic anticoagulation. No overt GI bleeding in over 24 hours. Dysphagia: vague reports per patient. Would evaluate with BPE as outpatient. Poor fitting dentures may exacerbate.   Colonoscopy completed yesterday which found colonic diverticulosis otherwise normal exam. Recommendations include capsule endoscopy next week as outpatient for further workup of IDA and ok to resume anticoagulation per cardiology pending risks vs benefits. Coumadin was restarted last night and per cardiology patient will need PT/INR check on Monday. H/H stable today.  Plan: 1. Ok to discharge from GI standpoint 2. Will need outpatient capsule endoscopy next week, message has already been sent to [facility] office staff to schedule the procedure. 3. Anticoagulation per cardiology   Walden Field, AGNP-C Adult & Gerontological Nurse Practitioner [facility]    LOS: 3 days    06/02/2015, 12:58 PM

## 2015-06-02 NOTE — Progress Notes (Signed)
Patient with orders to be discharged home. Discharge instructions given, patient verbalized understanding. Prescription given. Patient stable. Patient left in private vehicle with family.

## 2015-06-02 NOTE — Discharge Summary (Signed)
Physician Discharge Summary  Tanner Pena YBO:[id number] DOB: 10/21/1940 DOA: 05/30/2015  PCP: [name], MD  Admit date: 05/30/2015 Discharge date: 06/02/2015  Time spent: Greater than 30 minutes  Recommendations for Outpatient Follow-up:  1. Recommend follow-up with the patient's hemoglobin.  2.   Discharge Diagnoses:  1. GI bleeding in the setting of anticoagulation. Etiology not clearly known during the hospitalization. -Outpatient capsule study will be ordered by GI. 2. Diverticulosis. 3. Acute blood loss anemia in the setting of anticoagulation. Patient's hemoglobin was 5.5 on admission. -Following 3 units of packed red blood cell transfusion, his hemoglobin improved and was 8.1 at the time of discharge. 4. Warfarin induced coagulopathy/INR. 5. 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 6. Recent chest pain in the setting of severe anemia; known history of CAD. 7. History of mesenteric ischemia, chronic, status post stenting of the proximal SMA in July 2016. 8. Acute kidney injury secondary to prerenal azotemia. 9. COPD. 10. Type 2 diabetes mellitus. 11. 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    Discharge Condition: Improved.  Diet recommendation: Heart healthy/carbohydrate modified.  Filed Weights   05/31/15 0500 06/01/15 0500 06/01/15 0822  Weight: 94.7 kg (208 lb 12.4 oz) 91.6 kg (201 lb 15.1 oz) 91.173 kg (201 lb)    History of present illness:  The patient is a 74 year old man with a history of CAD, status post aortic valve replacement with mechanical valve, chronic anticoagulation on Coumadin, chronic mesenteric ischemia-status post stenting of SMA and 02/2015, who presented to the emergency department on 05/30/2015 with a chief complaint of chest pain. During the evaluation in the emergency department, the patient was noted to have a hemoglobin of 5.5 and an INR of 5.6. His troponin I was within normal  limits. His chest x-ray revealed right basilar scarring versus atelectasis. He was admitted for further evaluation and management.  Hospital Course:   1. GI bleeding in the setting of anticoagulation. Patient reported melena, but not hematemesis or hematochezia. Coumadin was held on admission. -GI was consulted and started PPI empirically. -Patient underwent a colonoscopy on 10/12 and it revealed colonic diverticulosis, otherwise a normal exam. Diverticular bleed was not definitively identified as the diagnosis. -GI plans to proceed with a capsule study of the small intestine in the near future, probably as an outpatient. This will be arranged by GI. -Coumadin was restarted with the discussion by Dr. Buford Dresser who stated that Coumadin could be restarted if the benefits outweighed the risks. Both cardiology and the dictating physician agreed that Coumadin should be restarted given the patient's high-risk for stroke with the mechanical valve. -Patient had no melena or bright red blood during hospitalization. Acute on blood loss anemia, in the setting of anticoagulation. Patient's hemoglobin was 5.5 on admission. He was transfused 3 units of packed red blood cells. His hemoglobin has improved appropriately. It was 8.1 at the time of discharge. Warfarin-induced coagulopathy. The patient is treated with warfarin chronically for mechanical aortic valve replacement. His INR was 5.68 on admission. He was given vitamin K. Coumadin was held for the colonoscopy. His INR is became subtherapeutic. Coumadin was restarted given that the benefits of restarting it outweighed the risk because of his mechanical valve. The patient's INR was 1.64 at the time of discharge. He will follow-up with his PCP in 3 days for a recheck of his INR. -Of note, the dose was decreased to 5 mg daily rather than alternating with 7.5 mg daily as the patient's INR  was supratherapeutic on admission.  Acute kidney injury. The patient has no  history of chronic kidney disease. His creatinine was 1.74 on admission. Following gentle IV fluids and packed red blood cell transfusions, his creatinine has normalized. 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 -As above, Coumadin was held on admission for supratherapeutic INR and pending colonoscopy. Coumadin was restarted. Recent chest pain in the setting of severe anemia. Patient's troponin I was marginally elevated, but it normalized. His EKG revealed no acute ST or T-wave changes. - denies chest pain currently. -He does have a history of CAD, status post CABG and stenting. He was restarted on Toprol XL, Crestor, ramipril, with adjustments due to his low-normal blood pressure and isosor,bide mononitrate. -Cardiology was consulted and agree with medical management. History of mesenteric ischemia, chronic; status post stenting of the proximal SMA and July 2016. -Stable. Type 2 diabetes mellitus. Patient is treated with Amaryl and Januvia chronically; both were held during the hospitalization. Sliding scale NovoLog was given. His CBGs were reasonable and fairly well-managed. His hemoglobin A1c was 6.2. COPD. Patient had occasional wheezes on exam, but no evidence of COPD exacerbation. Xopenex nebulizer as needed was ordered.     Procedures:  Colonoscopy, Dr. [name], 05/31/15: "Normal-appearing rectal mucosa. Scattered left-sided diverticula; the remainder of the colonic mucosa appeared normal. The distal 5 cm of terminal ileal mucosa also appeared normal. Retroflexion was performed."    Consultations:  Gastroenterology  Cardiology  Discharge Exam: Filed Vitals:   06/02/15 1451  BP: 124/54  Pulse: 60  Temp: 97.8 F (36.6 C)  Resp: 20  Oxygen saturation 99%.   General: Pleasant alert 74 year old in no acute distress.  Cardiovascular: S1, S2, with a soft systolic murmur and an S2 click  Respiratory: Mostly clear, otherwise  breathing nonlabored  Abdomen: Positive bowel sounds, soft, nontender, nondistended.  Musculoskeletal: Left lower extremity BKA stump intact and without edema. Right lower extremity without edema.  Neurologic: He is alert and oriented 2. Cranial nerves II through XII are grossly intact.  Discharge Instructions   Discharge Instructions    AMB Referral to [place] Management    Complete by:  As directed   Please assign to [place] for chronic disease management: Please contact Royetta Crochet. Niemczura, RN, BSN, CCM  [facility] [phone] For questions  Reason for consult:  Please assign to [facility] RN CM community  Expected date of contact:  1-3 days (reserved for hospital discharges)     Comm to Care Management    Complete by:  As directed      Comm to Care Management    Complete by:  As directed      Diet - low sodium heart healthy    Complete by:  As directed      Diet Carb Modified    Complete by:  As directed      Discharge instructions    Complete by:  As directed   SOME OF YOUR MEDICATIONS HAS CHANGED - SEE MEDICATION LIST. FOLLOW UP AT YOUR DOCTOR'S OFFICE FOR COUMADIN BLOOD WORK IN 3 DAYS.     Increase activity slowly    Complete by:  As directed           Current Discharge Medication List    START taking these medications   Details  ferrous sulfate 325 (65 FE) MG EC tablet Take 1 tablet (325 mg total) by mouth daily with breakfast.      CONTINUE  these medications which have CHANGED   Details  furosemide (LASIX) 40 MG tablet Take 1 tablet (40 mg total) by mouth daily. RESTART ON SATURDAY, 09/03/14.    metoprolol succinate (TOPROL XL) 50 MG 24 hr tablet Take 1 tablet (50 mg total) by mouth daily. RESTART ON Saturday 06/04/15. Take with or immediately following a meal.    ramipril (ALTACE) 2.5 MG capsule Take 1 capsule (2.5 mg total) by mouth 2 (two) times daily. Qty: 60 capsule, Refills: 3    warfarin (COUMADIN) 5 MG tablet Take 1 tablet by mouth  daily or as directed by coumadin clinic. Qty: 30 tablet, Refills: 1      CONTINUE these medications which have NOT CHANGED   Details  albuterol (PROVENTIL) 4 MG tablet Take 4 mg by mouth 3 (three) times daily.    albuterol-ipratropium (COMBIVENT) 18-103 MCG/ACT inhaler Inhale 1 puff into the lungs 4 (four) times daily. Coughing/ Shortness of Breath    cholecalciferol (VITAMIN D) 1000 UNITS tablet Take 2,000 Units by mouth daily.    Emollient (EUCERIN) lotion Apply 10 mLs topically as needed for dry skin.    fenofibrate (TRICOR) 145 MG tablet Take 1 tablet (145 mg total) by mouth daily. NEED OV. Qty: 30 tablet, Refills: 0    fish oil-omega-3 fatty acids 1000 MG capsule Take 1 capsule (1 g total) by mouth 2 (two) times daily.    glimepiride (AMARYL) 1 MG tablet Take 1 mg by mouth 2 (two) times daily.    isosorbide mononitrate (IMDUR) 30 MG 24 hr tablet Take 1 tablet (30 mg total) by mouth daily. <PLEASE MAKE APPOINTMENT FOR REFILLS> Qty: 30 tablet, Refills: 0    nitroGLYCERIN (NITROSTAT) 0.4 MG SL tablet Place 1 tablet (0.4 mg total) under the tongue every 5 (five) minutes as needed for chest pain. Qty: 25 tablet, Refills: 12    oxyCODONE-acetaminophen (PERCOCET) 10-325 MG per tablet Take 1 tablet by mouth every 4 (four) hours as needed for pain. Qty: 30 tablet, Refills: 0    polyethylene glycol-electrolytes (NULYTELY/GOLYTELY) 420 G solution Take 4,000 mLs by mouth once. Qty: 4000 mL, Refills: 0    potassium chloride SA (K-DUR,KLOR-CON) 20 MEQ tablet Take 20 mEq by mouth daily.      pregabalin (LYRICA) 50 MG capsule Take one capsule by mouth twice daily for pains Qty: 60 capsule, Refills: 5    rosuvastatin (CRESTOR) 20 MG tablet TAKE (1) TABLET BY MOUTH AT BEDTIME FOR CHOLESTEROL. Qty: 30 tablet, Refills: 0    sitaGLIPtin (JANUVIA) 50 MG tablet Take 50 mg by mouth daily.    vitamin C (ASCORBIC ACID) 500 MG tablet Take 500 mg by mouth daily.      STOP taking these  medications     aspirin EC 81 MG EC tablet      clopidogrel (PLAVIX) 75 MG tablet        No Known Allergies Follow-up Information    Follow up with Manus Rudd, MD.   Specialty:  Gastroenterology   Why:  Dr. Roseanne Kaufman office will call you for you f/up appointment   Contact information:   [address] [place] [address] [phone]       Follow up with [name], MD.   Specialty:  Internal Medicine   Why:  F/up in 3 days for bloodwork to check your coumadin level   Contact information:   [address] [place] [address] [phone]        The results of significant diagnostics from this hospitalization (  including imaging, microbiology, ancillary and laboratory) are listed below for reference.    Significant Diagnostic Studies: Dg Chest Portable 1 View  05/30/2015  CLINICAL DATA:  EKG changes, hypertension, diabetes EXAM: PORTABLE CHEST 1 VIEW COMPARISON:  02/04/2015 FINDINGS: Mild right basilar airspace disease which may reflect scarring versus atelectasis. There is no other focal parenchymal opacity. There is no pleural effusion or pneumothorax. There is stable cardiomegaly. There is evidence of prior CABG. The osseous structures are unremarkable. IMPRESSION: 1. Mild right basilar airspace disease which may reflect scarring versus atelectasis. Electronically Signed   By: Kathreen Devoid   On: 05/30/2015 12:12    Microbiology: Recent Results (from the past 240 hour(s))  MRSA PCR Screening     Status: None   Collection Time: 05/30/15  4:33 PM  Result Value Ref Range Status   MRSA by PCR NEGATIVE NEGATIVE Final    Comment:        The GeneXpert MRSA Assay (FDA approved for NASAL specimens only), is one component of a comprehensive MRSA colonization surveillance program. It is not intended to diagnose MRSA infection nor to guide or monitor treatment for MRSA infections.      Labs: Basic Metabolic Panel:  Recent Labs Lab 05/30/15 1043  05/30/15 1158 05/31/15 0501 06/01/15 0204 06/02/15 0605  NA 137 139 139 138 140  K 4.3 4.3 4.2 3.7 3.8  CL 109 110 113* 109 111  CO2 '23 23 22 22 24  '$ GLUCOSE 239* 161* 130* 101* 119*  BUN 63* 60* 35* 22* 20  CREATININE 1.74* 1.65* 1.14 1.11 1.00  CALCIUM 8.3* 8.3* 8.8* 8.6* 8.7*   Liver Function Tests:  Recent Labs Lab 05/30/15 1158 05/31/15 0501  AST 19 18  ALT 10* 9*  ALKPHOS 21* 21*  BILITOT 0.4 0.8  PROT 6.3* 6.1*  ALBUMIN 3.3* 3.1*   No results for input(s): LIPASE, AMYLASE in the last 168 hours. No results for input(s): AMMONIA in the last 168 hours. CBC:  Recent Labs Lab 05/30/15 1158  05/31/15 0501 05/31/15 0808 05/31/15 1711 06/01/15 0204 06/02/15 0605  WBC 12.0*  < > 11.6* 12.3* 13.8* 11.5* 8.4  NEUTROABS 9.6*  --   --   --   --   --   --   HGB 5.5*  < > 8.6* 8.4* 9.1* 8.1* 8.1*  HCT 18.6*  < > 27.7* 26.7* 29.6* 26.1* 26.6*  MCV 75.3*  < > 79.8 79.9 80.9 80.1 80.9  PLT 365  < > 313 275 319 277 284  < > = values in this interval not displayed. Cardiac Enzymes:  Recent Labs Lab 05/30/15 1158 05/30/15 2109 05/31/15 0105 05/31/15 0501  TROPONINI 0.03 0.03 0.04* 0.03   BNP: BNP (last 3 results)  Recent Labs  12/28/14 0134 02/04/15 0413  BNP 504.3* 593.2*    ProBNP (last 3 results) No results for input(s): PROBNP in the last 8760 hours.  CBG:  Recent Labs Lab 06/01/15 1138 06/01/15 1626 06/01/15 2048 06/02/15 0742 06/02/15 1126  GLUCAP 99 134* 153* 125* 150*       Signed:  Sarika Baldini  [facility] 06/02/2015, 3:28 PM

## 2015-06-02 NOTE — Progress Notes (Signed)
[place] for Coumadin Indication: Mechanical Aortic Valve (St. Jude)  No Known Allergies  Patient Measurements: Height: '5\' 10"'$  (177.8 cm) Weight: 201 lb (91.173 kg) IBW/kg (Calculated) : 73 Heparin Dosing Weight:   Vital Signs: Temp: 98.2 F (36.8 C) (10/14 0418) Temp Source: Oral (10/14 0418) BP: 134/55 mmHg (10/14 0418) Pulse Rate: 54 (10/13 2045)  Labs:  Recent Labs  05/30/15 2109 05/31/15 0105 05/31/15 0501  05/31/15 1711 06/01/15 0204 06/02/15 0605  HGB  --  8.5* 8.6*  < > 9.1* 8.1* 8.1*  HCT  --  27.1* 27.7*  < > 29.6* 26.1* 26.6*  PLT  --  296 313  < > 319 277 284  LABPROT  --   --  19.7*  --   --  19.3* 19.5*  INR  --   --  1.67*  --   --  1.62* 1.64*  CREATININE  --   --  1.14  --   --  1.11 1.00  TROPONINI 0.03 0.04* 0.03  --   --   --   --   < > = values in this interval not displayed.  Estimated Creatinine Clearance: 73.6 mL/min (by C-G formula based on Cr of 1).   Medical History: Past Medical History  Diagnosis Date  . Type 2 diabetes mellitus ([place]) 2007  . Essential hypertension   . Gout   . Hypercholesteremia   . Peripheral vascular disease ([place])     Lower extremity PCI/stenting  . COPD (chronic obstructive pulmonary disease) ([place])   . S/P aortic valve replacement 1990    a. St. Jude  . Chronic back pain   . Dysphagia   . Neuromuscular disorder ([place])   . Peripheral neuropathy ([place])   . Critical lower limb ischemia   . CAD (coronary artery disease)     a. 05/13/14 [name] s/p overlapping DESx2 to SVG to RCA. b.  s/p CABG '90 with redo '94 & stent to RCA SVG in 2005  . 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 Left foot  . Chronic systolic heart failure ([place])   . History of kidney stones   . GERD (gastroesophageal reflux disease)   . Arthritis   . Sleep apnea   . Myocardial infarction [facility])     Medications:  Prescriptions prior to admission  Medication Sig Dispense Refill Last Dose  . albuterol (PROVENTIL)  4 MG tablet Take 4 mg by mouth 3 (three) times daily.   05/30/2015 at Unknown time  . albuterol-ipratropium (COMBIVENT) 18-103 MCG/ACT inhaler Inhale 1 puff into the lungs 4 (four) times daily. Coughing/ Shortness of Breath   Past Week at Unknown time  . aspirin EC 81 MG EC tablet Take 1 tablet (81 mg total) by mouth daily. 30 tablet 0 05/30/2015 at Unknown time  . cholecalciferol (VITAMIN D) 1000 UNITS tablet Take 2,000 Units by mouth daily.   Past Week at Unknown time  . clopidogrel (PLAVIX) 75 MG tablet Take 1 tablet (75 mg total) by mouth daily. <PLEASE MAKE APPOINTMENT FOR REFILLS> 30 tablet 0 Past Week at Unknown time  . Emollient (EUCERIN) lotion Apply 10 mLs topically as needed for dry skin.   Past Month at Unknown time  . fenofibrate (TRICOR) 145 MG tablet Take 1 tablet (145 mg total) by mouth daily. NEED OV. 30 tablet 0 Past Week at Unknown time  . fish oil-omega-3 fatty acids 1000 MG capsule Take 1 capsule (1 g total) by mouth  2 (two) times daily.   05/30/2015 at Unknown time  . furosemide (LASIX) 40 MG tablet Take 1 tablet (40 mg total) by mouth daily. (Patient taking differently: Take 80 mg by mouth daily. ) 30 tablet 1 05/30/2015 at Unknown time  . glimepiride (AMARYL) 1 MG tablet Take 1 mg by mouth 2 (two) times daily.   05/30/2015 at Unknown time  . isosorbide mononitrate (IMDUR) 30 MG 24 hr tablet Take 1 tablet (30 mg total) by mouth daily. <PLEASE MAKE APPOINTMENT FOR REFILLS> 30 tablet 0 05/30/2015 at Unknown time  . metoprolol succinate (TOPROL XL) 50 MG 24 hr tablet Take 50 mg by mouth daily. Take with or immediately following a meal.   05/30/2015 at Unknown time  . nitroGLYCERIN (NITROSTAT) 0.4 MG SL tablet Place 1 tablet (0.4 mg total) under the tongue every 5 (five) minutes as needed for chest pain. 25 tablet 12 unknown  . oxyCODONE-acetaminophen (PERCOCET) 10-325 MG per tablet Take 1 tablet by mouth every 4 (four) hours as needed for pain. (Patient taking differently: Take 1  tablet by mouth every 3 (three) hours as needed for pain. ) 30 tablet 0 Past Month at Unknown time  . polyethylene glycol-electrolytes (NULYTELY/GOLYTELY) 420 G solution Take 4,000 mLs by mouth once. 4000 mL 0   . potassium chloride SA (K-DUR,KLOR-CON) 20 MEQ tablet Take 20 mEq by mouth daily.     05/30/2015 at Unknown time  . pregabalin (LYRICA) 50 MG capsule Take one capsule by mouth twice daily for pains (Patient taking differently: Take 50 mg by mouth 3 (three) times daily. ) 60 capsule 5 Past Week at Unknown time  . ramipril (ALTACE) 2.5 MG capsule Take 2.5 mg by mouth at bedtime.    Past Week at Unknown time  . ramipril (ALTACE) 5 MG capsule Take 5 mg by mouth every morning.   05/30/2015 at Unknown time  . rosuvastatin (CRESTOR) 20 MG tablet TAKE (1) TABLET BY MOUTH AT BEDTIME FOR CHOLESTEROL. 30 tablet 0 Past Week at Unknown time  . sitaGLIPtin (JANUVIA) 50 MG tablet Take 50 mg by mouth daily.   05/30/2015 at Unknown time  . vitamin C (ASCORBIC ACID) 500 MG tablet Take 500 mg by mouth daily.   Past Week at Unknown time  . warfarin (COUMADIN) 5 MG tablet Take 1 tablet by mouth daily or as directed by coumadin clinic. (Patient taking differently: Take 5-7.5 mg by mouth See admin instructions. Take one tablet ('5mg'$  total) on all days except on Fridays. Take one and one-half tablet (7.'5mg'$  total)on Fridays) 30 tablet 1 05/30/2015 at Unknown time    Assessment: The patient is treated with warfarin chronically for aortic valve replacement. His INR was 5.68 on admission. He was given vitamin K. Coumadin was held for the colonoscopy. His INR is now subtherapeutic. Coumadin restarted as the benefit appears to outweigh the risk given mechanical valve.  Goal of Therapy:  INR 2.5 - 3.5 Monitor platelets by anticoagulation protocol: Yes   Plan:  Coumadin 5 mg po x 1 INR/PT daily Monitor for bleeding   [facility] Teegarden Poteet 06/02/2015,8:20 AM

## 2015-06-03 LAB — TYPE AND SCREEN
ABO/RH(D): A POS
ANTIBODY SCREEN: NEGATIVE
UNIT DIVISION: 0
Unit division: 0
Unit division: 0
Unit division: 0

## 2015-06-05 ENCOUNTER — Other Ambulatory Visit: Payer: Self-pay | Admitting: *Deleted

## 2015-06-05 ENCOUNTER — Other Ambulatory Visit: Payer: Self-pay | Admitting: Cardiovascular Disease

## 2015-06-05 ENCOUNTER — Telehealth: Payer: Self-pay | Admitting: Cardiovascular Disease

## 2015-06-05 ENCOUNTER — Ambulatory Visit (INDEPENDENT_AMBULATORY_CARE_PROVIDER_SITE_OTHER): Payer: Self-pay | Admitting: Pharmacist Clinician (PhC)/ Clinical Pharmacy Specialist

## 2015-06-05 ENCOUNTER — Other Ambulatory Visit: Payer: Self-pay

## 2015-06-05 DIAGNOSIS — K922 Gastrointestinal hemorrhage, unspecified: Secondary | ICD-10-CM

## 2015-06-05 DIAGNOSIS — Z7901 Long term (current) use of anticoagulants: Secondary | ICD-10-CM

## 2015-06-05 DIAGNOSIS — Z952 Presence of prosthetic heart valve: Secondary | ICD-10-CM

## 2015-06-05 DIAGNOSIS — D649 Anemia, unspecified: Secondary | ICD-10-CM

## 2015-06-05 DIAGNOSIS — Z954 Presence of other heart-valve replacement: Secondary | ICD-10-CM

## 2015-06-05 LAB — COAGUCHEK XS/INR WAIVED
INR: 2.1 — ABNORMAL HIGH (ref 0.9–1.1)
Prothrombin Time: 25.4 s

## 2015-06-05 NOTE — Telephone Encounter (Signed)
Pt called office back and he can do the [place] on 06/08/2015 @ 0700. Pt has instructions

## 2015-06-05 NOTE — Telephone Encounter (Signed)
Pending PA# per [facility] is [phone]

## 2015-06-05 NOTE — Telephone Encounter (Signed)
Answered questions for patient, to follow up with Dr. [name] in 2 weeks and call if problems prior.

## 2015-06-05 NOTE — Telephone Encounter (Signed)
Called pt to set up Capsule study. LM with family member for him to call back

## 2015-06-05 NOTE — Patient Outreach (Signed)
Call back from patient daughter, [name], she spoke with Nate at Dr. Evette Georges office, who instructed daughter to keep patient on Plavix and also on increased dose of Lasix, and to keep all medications for heart the same and Dr. [name] will review all medications at this next appointment.  Daughter verbalizes that patient has been through a lot this past year with his heart and with having multiple surgeries for amputation and revision due to heart and circulation issues. Now patient has internal bleeding that they are not able to find what is causing the issue.  Patient has appointment with Dr. Gerarda Fraction Wednesday afternoon.  RNCM instructed daughter to just let her know if we need to reschedule appointment for Thursday, she verbalized understanding. Plan to continue patient in Transition of care program. Royetta Crochet. Laymond Purser, RN, BSN, [place] [phone]

## 2015-06-05 NOTE — Telephone Encounter (Signed)
Pt's daughter called in wanting to speak with a nurse about some changes that happened while he was in [facility]. Please f/u with her   Thanks

## 2015-06-05 NOTE — Patient Outreach (Signed)
Call to patient daughter in response to her message left on RNCM Voicemail RNCM has permission to speak with daughter per patient consent   TOC assessment completed Patient doing well, has appointments for rehab and a capsule study, patient needs appointment with primary care physician Patient did not stop Plavix as directed on discharge summary, daughter reports that patient has several stents that he is taking the Plavix for and she is worried about the stents if he stopped Plavix Patient was not taking aspirin and he is on warfarin due to AVR Patient did not decrease lasix as directed on discharge summary, daughter reports patient has been inpatient for HF several times this year and it was agreed to keep patient on '80mg'$  of lasix to keep fluid in check, this has helped so far. Patient does not have appointment to see Dr. Gerarda Fraction   Assessment: Medication discrepancies when reviewing discharge summary Needs appointment with primary care physician   Plan: Daughter agrees to call Dr. Evette Georges office regarding Lasix and Plavix, due to discrepancy on discharge summary RNCM will reschedule appointment for home visit RNCM will follow up with daughter on medication questions  Plan visit for later this week, if patient available if there is a conflict due to multiple appointments RNCM will reschedule.   Royetta Crochet. Laymond Purser, RN, BSN, [place] [phone]

## 2015-06-06 ENCOUNTER — Ambulatory Visit
Admission: RE | Admit: 2015-06-06 | Discharge: 2015-06-06 | Disposition: A | Discharge status: Home / Self Care | Payer: Medicare Other | Source: Ambulatory Visit | Attending: Interventional Radiology | Admitting: Interventional Radiology

## 2015-06-06 DIAGNOSIS — Z89512 Acquired absence of left leg below knee: Secondary | ICD-10-CM | POA: Diagnosis not present

## 2015-06-06 DIAGNOSIS — K551 Chronic vascular disorders of intestine: Secondary | ICD-10-CM | POA: Diagnosis not present

## 2015-06-06 DIAGNOSIS — R262 Difficulty in walking, not elsewhere classified: Secondary | ICD-10-CM | POA: Diagnosis not present

## 2015-06-06 NOTE — Progress Notes (Signed)
Chief Complaint: Chronic Mesenteric Ischemia, SP SMA stenting  Referring Physician(s): Dr. [name]  History of Present Illness: Tanner Pena is a 74 y.o. male presenting today in follow up for chronic mesenteric ischemia, SP SMA stenting performed July of 2016.    Our office had recently seen Tanner Pena in late September for his follow up appointment.  At the time, he was not NPO and a mesenteric duplex examination was deferred to this later date.  This study was performed today as scheduled, however, was non-diagnostic as he forgot his NPO status.   On the prior visit in September, he had complained of some new onset melena.  This was discussed with Ms Laban Emperor of Dr. Roseanne Kaufman office.  There was a plan in place for follow up colonoscopy, but the patient was admitted through the ED to [facility] for blood loss anemia, chest pain, and GI bleeding.  On admission, his INR was supra-therapeutic and Hgb was 5.5.    During admission, colonoscopy was negative for source of bleeding.  INR was optimized, and anti-platelet medication was stopped.    Today, he denies any ongoing melena or BRBPR.  Denies any hematemesis.  He is feeling better.  He has no complaints of food-fear, and has no pain with eating meals.  His episodic stomach pain does not seem to be related to timing of meals.    He reports that he will have a capsule endoscopy study performed on Thursday of this week with Dr. Roseanne Kaufman office.  We will await the results before further diagnostics.    Past Medical History  Diagnosis Date  . Type 2 diabetes mellitus ([place]) 2007  . Essential hypertension   . Gout   . Hypercholesteremia   . Peripheral vascular disease ([place])     Lower extremity PCI/stenting  . COPD (chronic obstructive pulmonary disease) ([place])   . S/P aortic valve replacement 1990    a. St. Jude  . Chronic back pain   . Dysphagia   . Neuromuscular disorder ([place])   . Peripheral neuropathy ([place])   .  Critical lower limb ischemia   . CAD (coronary artery disease)     a. 05/13/14 [name] s/p overlapping DESx2 to SVG to RCA. b.  s/p CABG '90 with redo '94 & stent to RCA SVG in 2005  . Chronic toe ulcer ([place])     a. Left foot  . Chronic systolic heart failure ([place])   . History of kidney stones   . GERD (gastroesophageal reflux disease)   . Arthritis   . Sleep apnea   . Myocardial infarction ([place])   . GI bleeding 05/31/2015    Source not identified.  . Acute blood loss anemia 02/2015 & 05/2015    Hemoglobin 5.5 on 05/31/15; status post transfusion.    Past Surgical History  Procedure Laterality Date  . Aortic valve replacement  1990    St. Jude  . Rotator cuff repair      right  . Cataract extraction      bilateral  . Coronary stent placement  2005    RCA vein graft A 3.0x13.0 TAXUS stent was then placed int he vessel a Viva 3.0x4.0 (perfusion balloon was made ready it was placed through the entire lenght of the stent  . Peripheral vascular procedures lower extremities      right external iliac  artery PTA and stenting as well as bilateral SFA intervention remotely. Repeat procedures in 2011 bilaterally  . Coronary artery  bypass graft  1994    6 vessels  . [name]  06/13/2011    Procedure: Venia Minks DILATION;  Surgeon: [name], MD;  Location: [facility];  Service: Endoscopy;  Laterality: N/A;  Dilated to 56.   . Angioplasty illiac artery    . Back surgery  [id number]    2  . Eye surgery    . Amputation Left 07/13/2014    Procedure: Transmetatarsal Amputation;  Surgeon: [name], MD;  Location: [facility];  Service: Orthopedics;  Laterality: Left;  . Lower extremity angiogram N/A 02/15/2013    Procedure: LOWER EXTREMITY ANGIOGRAM;  Surgeon: [name], MD;  Location: [facility] CATH LAB;  Service: Cardiovascular;  Laterality: N/A;  . Left heart catheterization with coronary angiogram N/A 05/11/2014    Procedure: LEFT HEART CATHETERIZATION WITH CORONARY ANGIOGRAM;  Surgeon:  [name], MD;  Location: [facility] CATH LAB;  Service: Cardiovascular;  Laterality: N/A;  . Percutaneous coronary stent intervention (pci-s) N/A 05/13/2014    Procedure: PERCUTANEOUS CORONARY STENT INTERVENTION (PCI-S);  Surgeon: [name], MD;  Location: [facility] CATH LAB;  Service: Cardiovascular;  Laterality: N/A;  . Lower extremity angiogram N/A 06/06/2014    Procedure: LOWER EXTREMITY ANGIOGRAM;  Surgeon: [name], MD;  Location: [facility] CATH LAB;  Service: Cardiovascular;  Laterality: N/A;  . Amputation Left 08/20/2014    Procedure: Revision Transmetatarsal Amputation versus Below Knee Amputation;  Surgeon: [name], MD;  Location: [facility];  Service: Orthopedics;  Laterality: Left;  . Stump revision Left 09/23/2014    Procedure: Revision Left Below Knee Amputation;  Surgeon: [name], MD;  Location: [place];  Service: Orthopedics;  Laterality: Left;  . Stump revision Left 10/13/2014    Procedure: REVISION LEFT BELOW KNEE AMPUTATION STUMP;  Surgeon: [name], MD;  Location: [facility];  Service: Orthopedics;  Laterality: Left;  . Esophagogastroduodenoscopy N/A 02/09/2015    DR. Schooler: Normal EGD  . Esophagogastroduodenoscopy  05/2011    Dr. [name]: s/p esophageal dilation, antral erosions/nodularity with benign biopsies  . Colonoscopy  05/2011    Dr. [name]: benign rectal polyp, left sided tics, ascending colonic ulcers (path c/w ischemia)  . Colonoscopy with propofol N/A 06/01/2015    Procedure: COLONOSCOPY WITH PROPOFOL; IN CECUM AT  0921; WITHDRAWAL TIME 8 MINUTES;  Surgeon: [name], MD;  Location: [facility];  Service: Endoscopy;  Laterality: N/A;    Allergies: Review of patient's allergies indicates no known allergies.  Medications: Prior to Admission medications   Medication Sig Start Date End Date Taking? Authorizing Provider  albuterol (PROVENTIL) 4 MG tablet Take 4 mg by mouth 3 (three) times daily. 01/20/15   Historical Provider, MD    albuterol-ipratropium (COMBIVENT) 18-103 MCG/ACT inhaler Inhale 1 puff into the lungs 4 (four) times daily. Coughing/ Shortness of Breath    Historical Provider, MD  cholecalciferol (VITAMIN D) 1000 UNITS tablet Take 2,000 Units by mouth daily.    Historical Provider, MD  Emollient (EUCERIN) lotion Apply 10 mLs topically as needed for dry skin.    Historical Provider, MD  fenofibrate (TRICOR) 145 MG tablet Take 1 tablet (145 mg total) by mouth daily. NEED OV. 05/19/15   [name], MD  ferrous sulfate 325 (65 FE) MG EC tablet Take 1 tablet (325 mg total) by mouth daily with breakfast. 06/02/15   Rexene Alberts, MD  fish oil-omega-3 fatty acids 1000 MG capsule Take 1 capsule (1 g total) by mouth 2 (two) times daily. 01/22/13  Kristin L Alvstad, RPH-CPP  furosemide (LASIX) 40 MG tablet Take 1 tablet (40 mg total) by mouth daily. RESTART ON SATURDAY, 09/03/14. 06/02/15   Rexene Alberts, MD  glimepiride (AMARYL) 1 MG tablet Take 1 mg by mouth 2 (two) times daily.    Historical Provider, MD  isosorbide mononitrate (IMDUR) 30 MG 24 hr tablet Take 1 tablet (30 mg total) by mouth daily. <PLEASE MAKE APPOINTMENT FOR REFILLS> 05/19/15   [name], MD  metoprolol succinate (TOPROL XL) 50 MG 24 hr tablet Take 1 tablet (50 mg total) by mouth daily. RESTART ON Saturday 06/04/15. Take with or immediately following a meal. 06/02/15   Rexene Alberts, MD  nitroGLYCERIN (NITROSTAT) 0.4 MG SL tablet Place 1 tablet (0.4 mg total) under the tongue every 5 (five) minutes as needed for chest pain. 05/17/14   Eileen Stanford, PA-C  oxyCODONE-acetaminophen (PERCOCET) 10-325 MG per tablet Take 1 tablet by mouth every 4 (four) hours as needed for pain. Patient taking differently: Take 1 tablet by mouth every 3 (three) hours as needed for pain.  09/23/14   [name], MD  polyethylene glycol-electrolytes (NULYTELY/GOLYTELY) 420 G solution Take 4,000 mLs by mouth once. 05/18/15   Orvil Feil, NP  potassium chloride SA  (K-DUR,KLOR-CON) 20 MEQ tablet Take 20 mEq by mouth daily.      Historical Provider, MD  pregabalin (LYRICA) 50 MG capsule Take one capsule by mouth twice daily for pains Patient taking differently: Take 50 mg by mouth 3 (three) times daily.  08/23/14   Blanchie Serve, MD  ramipril (ALTACE) 2.5 MG capsule Take 1 capsule (2.5 mg total) by mouth 2 (two) times daily. 06/02/15   Rexene Alberts, MD  rosuvastatin (CRESTOR) 20 MG tablet TAKE (1) TABLET BY MOUTH AT BEDTIME FOR CHOLESTEROL. 05/19/15   [name], MD  sitaGLIPtin (JANUVIA) 50 MG tablet Take 50 mg by mouth daily.    Historical Provider, MD  vitamin C (ASCORBIC ACID) 500 MG tablet Take 500 mg by mouth daily.    Historical Provider, MD  warfarin (COUMADIN) 5 MG tablet Take 1 tablet by mouth daily or as directed by coumadin clinic. 06/02/15   Rexene Alberts, MD     Family History  Problem Relation Age of Onset  . Colon cancer Neg Hx   . Liver disease Neg Hx     Social History   Social History  . Marital Status: Legally Separated    Spouse Name: N/A  . Number of Children: 5  . Years of Education: N/A   Occupational History  . retired     Stage manager   Social History Main Topics  . Smoking status: Former Smoker -- 1.00 packs/day for 55 years    Types: Cigarettes    Start date: 08/19/1953    Quit date: 05/07/2014  . Smokeless tobacco: Current User    Types: Chew    Last Attempt to Quit: 08/20/1995     Comment: Has quit on 3 occasions. Counseling given today 5-10 minutes   I am more than likely going to quit "  . Alcohol Use: No     Comment: Socially, sometimes 12 ounce beer daily, may go month without/no whiskey  . Drug Use: No  . Sexual Activity: Not on file   Other Topics Concern  . Not on file   Social History Narrative   Has 3 daughters   Has 2 sons      Review of Systems: A 12 point ROS discussed and  pertinent positives are indicated in the HPI above.  All other systems are negative.  Review of  Systems  Vital Signs: BP 125/56 mmHg  Pulse 57  Temp(Src) 97.9 F (36.6 C) (Oral)  Resp 14  Ht 5' 9.5" (1.765 m)  Wt 197 lb (89.359 kg)  BMI 28.68 kg/m2  SpO2 98%  Physical Exam  Mallampati Score:     Imaging: Dg Chest Portable 1 View  05/30/2015  CLINICAL DATA:  EKG changes, hypertension, diabetes EXAM: PORTABLE CHEST 1 VIEW COMPARISON:  02/04/2015 FINDINGS: Mild right basilar airspace disease which may reflect scarring versus atelectasis. There is no other focal parenchymal opacity. There is no pleural effusion or pneumothorax. There is stable cardiomegaly. There is evidence of prior CABG. The osseous structures are unremarkable. IMPRESSION: 1. Mild right basilar airspace disease which may reflect scarring versus atelectasis. Electronically Signed   By: Kathreen Devoid   On: 05/30/2015 12:12    Labs:  CBC:  Recent Labs  05/31/15 0808 05/31/15 1711 06/01/15 0204 06/02/15 0605  WBC 12.3* 13.8* 11.5* 8.4  HGB 8.4* 9.1* 8.1* 8.1*  HCT 26.7* 29.6* 26.1* 26.6*  PLT 275 319 277 284    COAGS:  Recent Labs  08/20/14 0615  09/23/14 1303  10/13/14 0704  02/04/15 0415  05/31/15 0501 06/01/15 0204 06/02/15 0605 06/05/15 1030  INR 3.13*  < > 4.38*  < > 2.94*  < > 2.62*  < > 1.67* 1.62* 1.64* 2.1*  APTT 44*  --  53*  --  45*  --  36  --   --   --   --   --   < > = values in this interval not displayed.  BMP:  Recent Labs  05/30/15 1158 05/31/15 0501 06/01/15 0204 06/02/15 0605  NA 139 139 138 140  K 4.3 4.2 3.7 3.8  CL 110 113* 109 111  CO2 '23 22 22 24  '$ GLUCOSE 161* 130* 101* 119*  BUN 60* 35* 22* 20  CALCIUM 8.3* 8.8* 8.6* 8.7*  CREATININE 1.65* 1.14 1.11 1.00  GFRNONAA 39* >60 >60 >60  GFRAA 46* >60 >60 >60    LIVER FUNCTION TESTS:  Recent Labs  03/01/15 1544 03/02/15 0609 05/30/15 1158 05/31/15 0501  BILITOT 0.6 0.5 0.4 0.8  AST '25 18 19 18  '$ ALT 13* 12* 10* 9*  ALKPHOS 34* 28* 21* 21*  PROT 6.8 5.7* 6.3* 6.1*  ALBUMIN 3.1* 2.6* 3.3* 3.1*     TUMOR MARKERS: No results for input(s): AFPTM, CEA, CA199, CHROMGRNA in the last 8760 hours.  Assessment and Plan:  Tanner Pena presents in follow up today after SMA covered stent placed in July.  He is now 3 months post op.  Our scheduled mesenteric duplex performed today was non-diagnostic, although the patient was not NPO as recommended, which may play a role.    At this time, he has a scheduled capsule endoscopy on Thursday with Dr. [name]. We will await the results before scheduling any further diagnostics.  I would prefer mesenteric duplex at 3 month intervals for the first year, but we may have to consider CTA if NPO status does not allow visualization.    Once the results of the capsule endoscopy and work up for anemia have been performed, we can decide on further diagnostics.   Usually, after 3 months of plavix therapy I would prefer life-long ASA therapy, but for now no anti-platelet is reasonable given his recent GI blood loss.   Please call with any questions  or concerns.  SignedCorrie Mckusick 06/06/2015, 1:22 PM   I spent a total of    15 Minutes in face to face in clinical consultation, greater than 50% of which was counseling/coordinating care for chronic mesenteric ischemia, SMA stenting.

## 2015-06-07 DIAGNOSIS — I10 Essential (primary) hypertension: Secondary | ICD-10-CM | POA: Diagnosis not present

## 2015-06-07 DIAGNOSIS — Z1389 Encounter for screening for other disorder: Secondary | ICD-10-CM | POA: Diagnosis not present

## 2015-06-07 DIAGNOSIS — E1129 Type 2 diabetes mellitus with other diabetic kidney complication: Secondary | ICD-10-CM | POA: Diagnosis not present

## 2015-06-07 DIAGNOSIS — E611 Iron deficiency: Secondary | ICD-10-CM | POA: Diagnosis not present

## 2015-06-07 DIAGNOSIS — K922 Gastrointestinal hemorrhage, unspecified: Secondary | ICD-10-CM | POA: Diagnosis not present

## 2015-06-07 DIAGNOSIS — Z6829 Body mass index (BMI) 29.0-29.9, adult: Secondary | ICD-10-CM | POA: Diagnosis not present

## 2015-06-08 ENCOUNTER — Encounter: Payer: Self-pay | Admitting: *Deleted

## 2015-06-08 ENCOUNTER — Encounter (HOSPITAL_COMMUNITY)
Admission: RE | Disposition: A | Discharge status: Home / Self Care | Payer: Self-pay | Source: Ambulatory Visit | Attending: Internal Medicine

## 2015-06-08 ENCOUNTER — Ambulatory Visit (HOSPITAL_COMMUNITY)
Admission: RE | Admit: 2015-06-08 | Discharge: 2015-06-08 | Disposition: A | Discharge status: Home / Self Care | Payer: Medicare Other | Source: Ambulatory Visit | Attending: Internal Medicine | Admitting: Internal Medicine

## 2015-06-08 ENCOUNTER — Other Ambulatory Visit: Payer: Self-pay | Admitting: *Deleted

## 2015-06-08 DIAGNOSIS — J449 Chronic obstructive pulmonary disease, unspecified: Secondary | ICD-10-CM | POA: Insufficient documentation

## 2015-06-08 DIAGNOSIS — I10 Essential (primary) hypertension: Secondary | ICD-10-CM | POA: Insufficient documentation

## 2015-06-08 DIAGNOSIS — K5790 Diverticulosis of intestine, part unspecified, without perforation or abscess without bleeding: Secondary | ICD-10-CM | POA: Diagnosis not present

## 2015-06-08 DIAGNOSIS — I2510 Atherosclerotic heart disease of native coronary artery without angina pectoris: Secondary | ICD-10-CM | POA: Diagnosis not present

## 2015-06-08 DIAGNOSIS — Z951 Presence of aortocoronary bypass graft: Secondary | ICD-10-CM | POA: Diagnosis not present

## 2015-06-08 DIAGNOSIS — Z7984 Long term (current) use of oral hypoglycemic drugs: Secondary | ICD-10-CM | POA: Insufficient documentation

## 2015-06-08 DIAGNOSIS — I509 Heart failure, unspecified: Secondary | ICD-10-CM | POA: Insufficient documentation

## 2015-06-08 DIAGNOSIS — K219 Gastro-esophageal reflux disease without esophagitis: Secondary | ICD-10-CM | POA: Insufficient documentation

## 2015-06-08 DIAGNOSIS — I252 Old myocardial infarction: Secondary | ICD-10-CM | POA: Diagnosis not present

## 2015-06-08 DIAGNOSIS — D509 Iron deficiency anemia, unspecified: Secondary | ICD-10-CM | POA: Insufficient documentation

## 2015-06-08 DIAGNOSIS — G4730 Sleep apnea, unspecified: Secondary | ICD-10-CM | POA: Diagnosis not present

## 2015-06-08 DIAGNOSIS — Z955 Presence of coronary angioplasty implant and graft: Secondary | ICD-10-CM | POA: Diagnosis not present

## 2015-06-08 DIAGNOSIS — D649 Anemia, unspecified: Secondary | ICD-10-CM | POA: Diagnosis not present

## 2015-06-08 DIAGNOSIS — Z87891 Personal history of nicotine dependence: Secondary | ICD-10-CM | POA: Insufficient documentation

## 2015-06-08 DIAGNOSIS — I739 Peripheral vascular disease, unspecified: Secondary | ICD-10-CM | POA: Diagnosis not present

## 2015-06-08 DIAGNOSIS — E119 Type 2 diabetes mellitus without complications: Secondary | ICD-10-CM | POA: Insufficient documentation

## 2015-06-08 DIAGNOSIS — M1990 Unspecified osteoarthritis, unspecified site: Secondary | ICD-10-CM | POA: Diagnosis not present

## 2015-06-08 HISTORY — PX: GIVENS CAPSULE STUDY: SHX5432

## 2015-06-08 SURGERY — IMAGING PROCEDURE, GI TRACT, INTRALUMINAL, VIA CAPSULE

## 2015-06-08 NOTE — Patient Outreach (Signed)
[facility]) Care Management   06/08/2015  Tanner Pena 1941-04-22 [id number]  Tanner Pena is an 74 y.o. male Met with patient and daughter.  Subjective:  "I feel pretty good" Patient reports he had to go to hospital this am to get "capsule", has to go back this afternoon at 3:30 to have leads and box removed. Patient states he is going to outpatient therapy to build up his strength Patient has appointment with Tanner Pena 11/3 Patient saw Tanner Pena yesterday for post hospital visit, Tanner Pena agreed that he should keep on medication per Tanner Pena. Patient reports he forgot his night time meds. Patient denies feeling light headed or dizzy or tired. Reviewed discharge summary with daughter, reviewed meds again daughter reports they were instructed by Tanner Pena and Tanner Pena to continue medications as before this hospitalization.   Objective:   Patient neatly groomed and dressed, sitting in scooter, has capsule monitor box around him.  BP 110/50 mmHg  Pulse 55  Resp 20  Ht 1.753 m (5' 9" )  Wt 208 lb (94.348 kg)  BMI 30.70 kg/m2  SpO2 97% BP 120/40 mmHg  Pulse 38  Resp 20  Ht 1.753 m (5' 9" )  Wt 208 lb (94.348 kg)  BMI 30.70 kg/m2  SpO2 96% Review of Systems  Constitutional: Negative.   HENT: Negative.   Eyes: Negative.   Respiratory: Positive for cough.        Dry cough  Cardiovascular: Negative.   Gastrointestinal: Negative.   Genitourinary: Negative.   Musculoskeletal: Negative.   Skin: Negative.   Neurological: Negative.   Endo/Heme/Allergies: Bruises/bleeds easily.    Physical Exam  Constitutional: He is oriented to person, place, and time. He appears well-developed.  HENT:  Head: Normocephalic.  Cardiovascular: Regular rhythm.   AVR  Musculoskeletal: Normal range of motion.  Left BKA  Neurological: He is alert and oriented to person, place, and time.    Current Medications:   Current Outpatient Prescriptions  Medication Sig  Dispense Refill  . albuterol (PROVENTIL) 4 MG tablet Take 4 mg by mouth 3 (three) times daily.    [name] albuterol-ipratropium (COMBIVENT) 18-103 MCG/ACT inhaler Inhale 1 puff into the lungs 4 (four) times daily. Coughing/ Shortness of Breath    . cholecalciferol (VITAMIN D) 1000 UNITS tablet Take 2,000 Units by mouth daily.    . Emollient (EUCERIN) lotion Apply 10 mLs topically as needed for dry skin.    . fenofibrate (TRICOR) 145 MG tablet Take 1 tablet (145 mg total) by mouth daily. NEED OV. 30 tablet 0  . ferrous sulfate 325 (65 FE) MG EC tablet Take 1 tablet (325 mg total) by mouth daily with breakfast.    . fish oil-omega-3 fatty acids 1000 MG capsule Take 1 capsule (1 g total) by mouth 2 (two) times daily.    . furosemide (LASIX) 40 MG tablet Take 1 tablet (40 mg total) by mouth daily. RESTART ON SATURDAY, 09/03/14.    . glimepiride (AMARYL) 1 MG tablet Take 1 mg by mouth 2 (two) times daily.    . isosorbide mononitrate (IMDUR) 30 MG 24 hr tablet Take 1 tablet (30 mg total) by mouth daily. <PLEASE MAKE APPOINTMENT FOR REFILLS> 30 tablet 0  . metoprolol succinate (TOPROL XL) 50 MG 24 hr tablet Take 1 tablet (50 mg total) by mouth daily. RESTART ON Saturday 06/04/15. Take with or immediately following a meal.    . nitroGLYCERIN (NITROSTAT) 0.4 MG SL tablet Place 1 tablet (0.4 mg  total) under the tongue every 5 (five) minutes as needed for chest pain. 25 tablet 12  . oxyCODONE-acetaminophen (PERCOCET) 10-325 MG per tablet Take 1 tablet by mouth every 4 (four) hours as needed for pain. (Patient taking differently: Take 1 tablet by mouth every 3 (three) hours as needed for pain. ) 30 tablet 0  . potassium chloride SA (K-DUR,KLOR-CON) 20 MEQ tablet Take 20 mEq by mouth daily.      . pregabalin (LYRICA) 50 MG capsule Take one capsule by mouth twice daily for pains (Patient taking differently: Take 50 mg by mouth 3 (three) times daily. ) 60 capsule 5  . ramipril (ALTACE) 2.5 MG capsule Take 1 capsule (2.5  mg total) by mouth 2 (two) times daily. 60 capsule 3  . rosuvastatin (CRESTOR) 20 MG tablet TAKE (1) TABLET BY MOUTH AT BEDTIME FOR CHOLESTEROL. 30 tablet 0  . sitaGLIPtin (JANUVIA) 50 MG tablet Take 50 mg by mouth daily.    . vitamin C (ASCORBIC ACID) 500 MG tablet Take 500 mg by mouth daily.    [name] warfarin (COUMADIN) 5 MG tablet Take 1 tablet by mouth daily or as directed by coumadin clinic. 30 tablet 1   No current facility-administered medications for this visit.    Functional Status:   In your present state of health, do you have any difficulty performing the following activities: 06/08/2015 06/01/2015  Hearing? N -  Vision? N -  Difficulty concentrating or making decisions? N -  Walking or climbing stairs? Y -  Dressing or bathing? N -  Doing errands, shopping? Tanner Pena  Preparing Food and eating ? N -  Using the Toilet? N -  In the past six months, have you accidently leaked urine? N -  Do you have problems with loss of bowel control? N -  Managing your Medications? Y -  Managing your Finances? Y -  Housekeeping or managing your Housekeeping? N -    Fall/Depression Screening:   Fall Risk  06/08/2015  Falls in the past year? No  Risk for fall due to : Impaired balance/gait  Risk for fall due to (comments): currently taking therapy outpatient    PHQ 2/9 Scores 06/08/2015  PHQ - 2 Score 0    Assessment:   HF GI bleed Diabetes COPD Fall risk  Plan:  [facility] CM Care Plan Problem One        Most Recent Value   Role Documenting the Problem One  Care Management Coordinator   [facility] CM Short Term Goal #1 (0-30 days)  Patient will have follow up appointment with Primary care Physician withing the next 7-10 days   [facility] CM Short Term Goal #1 Start Date  06/05/15   [facility] CM Short Term Goal #1 Met Date  06/08/15 [patient saw PCP 10/19 for post hospital follow up]   [facility] CM Short Term Goal #2 (0-30 days)  Patient will take medication as prescribed   [facility] CM Short Term Goal #2 Start Date   06/05/15   Interventions for Short Term Goal #2  RNCM reviewed medication with patient and daughter. Encouraged patient to try to keep medication by chair to remember to take night meds. Gave new medication box    [facility] CM Care Plan Problem Two        Most Recent Value   Care Plan Problem Two  knowledge deficit related to HF dx as evidenced by questions persented by patient and daughter   Role Documenting the Problem Two  Care Management  Coordinator   Care Plan for Problem Two  Active   Interventions for Problem Two Long Term Goal   Using teach back method, started education on importance of daily weights and introduced HF zones    [facility] Long Term Goal (31-90) days  Patient will not be hospitalized for HF dx in next 90 days   [facility] Long Term Goal Start Date  06/08/15   [facility] CM Short Term Goal #1 (0-30 days)  Patient will become familiar with HF zones and be able to recognize symptoms and when to call doctor and 911, in the next 30 days   [facility] CM Short Term Goal #1 Start Date  06/08/15   Interventions for Short Term Goal #2   Introduced patient and daughter to HF zones, reviewed when to contact MD and when to call 911, gave EMMI, reviewed sodium. Educated on importance of daily weights, instructed to write them in [place] gave blue [facility] calendar, reviewed HF zones, Weight log, CBG log and BP logs. RNCM gave medication box RNCM will continue transition of care calls will visit with patient in his home in November. Patient or daughter will call RNCM with any questions or needs.  Patient will keep upcoming appointments  Nneoma Harral E. Laymond Purser, RN, BSN, [place] [phone]

## 2015-06-08 NOTE — Telephone Encounter (Signed)
PA # is [id number] KGOV[facility] approved  Per [facility] online

## 2015-06-08 NOTE — Progress Notes (Signed)
[place] PA for 06/08/2015 is [id number]

## 2015-06-09 ENCOUNTER — Encounter (HOSPITAL_COMMUNITY): Payer: Self-pay | Admitting: Internal Medicine

## 2015-06-09 DIAGNOSIS — R262 Difficulty in walking, not elsewhere classified: Secondary | ICD-10-CM | POA: Diagnosis not present

## 2015-06-09 DIAGNOSIS — Z89512 Acquired absence of left leg below knee: Secondary | ICD-10-CM | POA: Diagnosis not present

## 2015-06-09 NOTE — Op Note (Signed)
Small Bowel [place] Capsule Study Procedure date:  06/08/2015  Referring Provider:  Dr. [name] PCP:  Dr. [name], MD  Indication for procedure:  Anemia, GI Bleed in the setting of anticoagulation  Patient data:  Wt: Unknown Ht: 5'10" (1.778 m) Waist: Unknown  Findings:  Complete study. Abnormal appearing gastric mucosa with possible erosions. Possible gastric ulcer noted at image around 00:21:42 with what appears to be dried blood. Additional gastric mucosa with abnormal appearance and an edematous/polypoid quality (00:21:50). Also what appears to be linear gastric erosions at (00:33:51). Remainder of small bowel with no significant findings noted. At 00:31:05 there was a white, round substance which lingered in the stomach, likely ingested food contents however difficult to fully appreciate due to stomach contents.  First Gastric image:  00:00:45 First Duodenal image: 00:39:27 First Ileo-Cecal Valve image: 03:51:32 First Cecal image: 03:53:03 Gastric Passage time: 00 h 38 m Small Bowel Passage time:  03 h 13 m  Summary & Recommendations: Several images of abnormal appearing gastric mucosa including possible erosions, edematous/polypoid mucosa. Also there is what appears to be a gastric ulcer with dried blood as well as possible linear erosions just before transitioning to the duodenum. Remainder of the study including small bowel with no noted significant findings.  Patient may benefit from further evaluation with upper endoscopy due to abnormalities of gastric mucosa of possible significance.   Walden Field, AGNP-C Adult & Gerontological Nurse Practitioner [facility]    Attending note:  Pertinent images reviewed; agree with above assessment and recommendations

## 2015-06-13 ENCOUNTER — Telehealth: Payer: Self-pay | Admitting: Gastroenterology

## 2015-06-13 DIAGNOSIS — R262 Difficulty in walking, not elsewhere classified: Secondary | ICD-10-CM | POA: Diagnosis not present

## 2015-06-13 DIAGNOSIS — Z89512 Acquired absence of left leg below knee: Secondary | ICD-10-CM | POA: Diagnosis not present

## 2015-06-13 NOTE — Telephone Encounter (Signed)
Please contact patient. There is some confusion regarding taking Plavix. He DOES NOT need to be on Plavix anymore. He should remain on Coumadin.

## 2015-06-14 ENCOUNTER — Encounter (HOSPITAL_COMMUNITY): Payer: Self-pay | Admitting: Emergency Medicine

## 2015-06-14 ENCOUNTER — Inpatient Hospital Stay (HOSPITAL_COMMUNITY)
Admission: EM | Admit: 2015-06-14 | Discharge: 2015-06-23 | DRG: 377 | Disposition: A | Discharge status: Home / Self Care | Payer: Medicare Other | Attending: Internal Medicine | Admitting: Internal Medicine

## 2015-06-14 ENCOUNTER — Telehealth: Payer: Self-pay | Admitting: Internal Medicine

## 2015-06-14 DIAGNOSIS — E1122 Type 2 diabetes mellitus with diabetic chronic kidney disease: Secondary | ICD-10-CM

## 2015-06-14 DIAGNOSIS — I5022 Chronic systolic (congestive) heart failure: Secondary | ICD-10-CM | POA: Diagnosis not present

## 2015-06-14 DIAGNOSIS — Z7902 Long term (current) use of antithrombotics/antiplatelets: Secondary | ICD-10-CM

## 2015-06-14 DIAGNOSIS — D62 Acute posthemorrhagic anemia: Secondary | ICD-10-CM | POA: Diagnosis present

## 2015-06-14 DIAGNOSIS — K2970 Gastritis, unspecified, without bleeding: Secondary | ICD-10-CM | POA: Insufficient documentation

## 2015-06-14 DIAGNOSIS — I25119 Atherosclerotic heart disease of native coronary artery with unspecified angina pectoris: Secondary | ICD-10-CM | POA: Diagnosis present

## 2015-06-14 DIAGNOSIS — R109 Unspecified abdominal pain: Secondary | ICD-10-CM

## 2015-06-14 DIAGNOSIS — K921 Melena: Secondary | ICD-10-CM | POA: Diagnosis not present

## 2015-06-14 DIAGNOSIS — I214 Non-ST elevation (NSTEMI) myocardial infarction: Secondary | ICD-10-CM | POA: Diagnosis present

## 2015-06-14 DIAGNOSIS — K2961 Other gastritis with bleeding: Principal | ICD-10-CM | POA: Diagnosis present

## 2015-06-14 DIAGNOSIS — R1031 Right lower quadrant pain: Secondary | ICD-10-CM | POA: Diagnosis not present

## 2015-06-14 DIAGNOSIS — E7800 Pure hypercholesterolemia, unspecified: Secondary | ICD-10-CM | POA: Diagnosis present

## 2015-06-14 DIAGNOSIS — E1165 Type 2 diabetes mellitus with hyperglycemia: Secondary | ICD-10-CM | POA: Diagnosis not present

## 2015-06-14 DIAGNOSIS — Z952 Presence of prosthetic heart valve: Secondary | ICD-10-CM

## 2015-06-14 DIAGNOSIS — K429 Umbilical hernia without obstruction or gangrene: Secondary | ICD-10-CM | POA: Diagnosis not present

## 2015-06-14 DIAGNOSIS — D649 Anemia, unspecified: Secondary | ICD-10-CM

## 2015-06-14 DIAGNOSIS — K2990 Gastroduodenitis, unspecified, without bleeding: Secondary | ICD-10-CM | POA: Diagnosis present

## 2015-06-14 DIAGNOSIS — I739 Peripheral vascular disease, unspecified: Secondary | ICD-10-CM | POA: Diagnosis present

## 2015-06-14 DIAGNOSIS — K254 Chronic or unspecified gastric ulcer with hemorrhage: Secondary | ICD-10-CM | POA: Diagnosis not present

## 2015-06-14 DIAGNOSIS — K2950 Unspecified chronic gastritis without bleeding: Secondary | ICD-10-CM | POA: Diagnosis not present

## 2015-06-14 DIAGNOSIS — Z5181 Encounter for therapeutic drug level monitoring: Secondary | ICD-10-CM | POA: Insufficient documentation

## 2015-06-14 DIAGNOSIS — I471 Supraventricular tachycardia: Secondary | ICD-10-CM | POA: Diagnosis present

## 2015-06-14 DIAGNOSIS — Z89612 Acquired absence of left leg above knee: Secondary | ICD-10-CM | POA: Diagnosis not present

## 2015-06-14 DIAGNOSIS — K2901 Acute gastritis with bleeding: Secondary | ICD-10-CM | POA: Diagnosis not present

## 2015-06-14 DIAGNOSIS — Z955 Presence of coronary angioplasty implant and graft: Secondary | ICD-10-CM | POA: Insufficient documentation

## 2015-06-14 DIAGNOSIS — I252 Old myocardial infarction: Secondary | ICD-10-CM | POA: Diagnosis not present

## 2015-06-14 DIAGNOSIS — E119 Type 2 diabetes mellitus without complications: Secondary | ICD-10-CM

## 2015-06-14 DIAGNOSIS — N179 Acute kidney failure, unspecified: Secondary | ICD-10-CM | POA: Diagnosis not present

## 2015-06-14 DIAGNOSIS — Z87891 Personal history of nicotine dependence: Secondary | ICD-10-CM

## 2015-06-14 DIAGNOSIS — E785 Hyperlipidemia, unspecified: Secondary | ICD-10-CM | POA: Diagnosis present

## 2015-06-14 DIAGNOSIS — K219 Gastro-esophageal reflux disease without esophagitis: Secondary | ICD-10-CM | POA: Diagnosis present

## 2015-06-14 DIAGNOSIS — K2951 Unspecified chronic gastritis with bleeding: Secondary | ICD-10-CM | POA: Diagnosis not present

## 2015-06-14 DIAGNOSIS — J449 Chronic obstructive pulmonary disease, unspecified: Secondary | ICD-10-CM | POA: Diagnosis present

## 2015-06-14 DIAGNOSIS — I110 Hypertensive heart disease with heart failure: Secondary | ICD-10-CM | POA: Diagnosis not present

## 2015-06-14 DIAGNOSIS — Z951 Presence of aortocoronary bypass graft: Secondary | ICD-10-CM

## 2015-06-14 DIAGNOSIS — Z954 Presence of other heart-valve replacement: Secondary | ICD-10-CM | POA: Diagnosis not present

## 2015-06-14 DIAGNOSIS — R001 Bradycardia, unspecified: Secondary | ICD-10-CM | POA: Diagnosis not present

## 2015-06-14 DIAGNOSIS — N181 Chronic kidney disease, stage 1: Secondary | ICD-10-CM

## 2015-06-14 DIAGNOSIS — K922 Gastrointestinal hemorrhage, unspecified: Secondary | ICD-10-CM | POA: Diagnosis present

## 2015-06-14 DIAGNOSIS — I10 Essential (primary) hypertension: Secondary | ICD-10-CM | POA: Diagnosis present

## 2015-06-14 DIAGNOSIS — Z7901 Long term (current) use of anticoagulants: Secondary | ICD-10-CM | POA: Diagnosis not present

## 2015-06-14 DIAGNOSIS — R079 Chest pain, unspecified: Secondary | ICD-10-CM | POA: Diagnosis not present

## 2015-06-14 DIAGNOSIS — K2971 Gastritis, unspecified, with bleeding: Secondary | ICD-10-CM | POA: Diagnosis not present

## 2015-06-14 LAB — PROTIME-INR
INR: 2.99 — ABNORMAL HIGH (ref 0.00–1.49)
Prothrombin Time: 30.5 seconds — ABNORMAL HIGH (ref 11.6–15.2)

## 2015-06-14 LAB — COMPREHENSIVE METABOLIC PANEL
ALBUMIN: 3.2 g/dL — AB (ref 3.5–5.0)
ALT: 11 U/L — ABNORMAL LOW (ref 17–63)
AST: 16 U/L (ref 15–41)
Alkaline Phosphatase: 18 U/L — ABNORMAL LOW (ref 38–126)
Anion gap: 5 (ref 5–15)
BILIRUBIN TOTAL: 0.7 mg/dL (ref 0.3–1.2)
BUN: 78 mg/dL — AB (ref 6–20)
CHLORIDE: 111 mmol/L (ref 101–111)
CO2: 26 mmol/L (ref 22–32)
CREATININE: 1.59 mg/dL — AB (ref 0.61–1.24)
Calcium: 9.2 mg/dL (ref 8.9–10.3)
GFR calc Af Amer: 48 mL/min — ABNORMAL LOW (ref >60)
GFR calc non Af Amer: 41 mL/min — ABNORMAL LOW (ref >60)
GLUCOSE: 134 mg/dL — AB (ref 65–99)
Potassium: 5 mmol/L (ref 3.5–5.1)
SODIUM: 142 mmol/L (ref 135–145)
Total Protein: 5.9 g/dL — ABNORMAL LOW (ref 6.5–8.1)

## 2015-06-14 LAB — CBC WITH DIFFERENTIAL/PLATELET
Basophils Absolute: 0.1 10*3/uL (ref 0.0–0.1)
Basophils Relative: 1 %
EOS ABS: 0.3 10*3/uL (ref 0.0–0.7)
Eosinophils Relative: 2 %
HCT: 24.3 % — ABNORMAL LOW (ref 39.0–52.0)
HEMOGLOBIN: 7.3 g/dL — AB (ref 13.0–17.0)
LYMPHS ABS: 2.7 10*3/uL (ref 0.7–4.0)
LYMPHS PCT: 24 %
MCH: 24 pg — AB (ref 26.0–34.0)
MCHC: 30 g/dL (ref 30.0–36.0)
MCV: 79.9 fL (ref 78.0–100.0)
Monocytes Absolute: 0.8 10*3/uL (ref 0.1–1.0)
Monocytes Relative: 7 %
NEUTROS PCT: 66 %
Neutro Abs: 7.4 10*3/uL (ref 1.7–7.7)
Platelets: 366 10*3/uL (ref 150–400)
RBC: 3.04 MIL/uL — AB (ref 4.22–5.81)
RDW: 19.9 % — ABNORMAL HIGH (ref 11.5–15.5)
WBC: 11.3 10*3/uL — AB (ref 4.0–10.5)

## 2015-06-14 LAB — TROPONIN I: TROPONIN I: 0.03 ng/mL (ref <0.031)

## 2015-06-14 LAB — MRSA PCR SCREENING: MRSA by PCR: NEGATIVE

## 2015-06-14 LAB — SAMPLE TO BLOOD BANK

## 2015-06-14 LAB — GLUCOSE, CAPILLARY: Glucose-Capillary: 71 mg/dL (ref 65–99)

## 2015-06-14 LAB — HEMOGLOBIN AND HEMATOCRIT, BLOOD
HEMATOCRIT: 23.4 % — AB (ref 39.0–52.0)
HEMOGLOBIN: 7.1 g/dL — AB (ref 13.0–17.0)

## 2015-06-14 LAB — PREPARE RBC (CROSSMATCH)

## 2015-06-14 MED ORDER — IPRATROPIUM-ALBUTEROL 0.5-2.5 (3) MG/3ML IN SOLN
3.0000 mL | Freq: Four times a day (QID) | RESPIRATORY_TRACT | Status: DC
  Administered 2015-06-14: 3 mL via RESPIRATORY_TRACT
  Filled 2015-06-14: qty 3

## 2015-06-14 MED ORDER — ISOSORBIDE MONONITRATE ER 30 MG PO TB24
30.0000 mg | ORAL_TABLET | Freq: Every day | ORAL | Status: DC
Start: 2015-06-15 — End: 2015-06-20
  Administered 2015-06-15 – 2015-06-19 (×5): 30 mg via ORAL
  Filled 2015-06-14 (×6): qty 1

## 2015-06-14 MED ORDER — PANTOPRAZOLE SODIUM 40 MG IV SOLR
40.0000 mg | Freq: Once | INTRAVENOUS | Status: AC
  Administered 2015-06-14: 40 mg via INTRAVENOUS
  Filled 2015-06-14: qty 40

## 2015-06-14 MED ORDER — FUROSEMIDE 80 MG PO TABS
80.0000 mg | ORAL_TABLET | Freq: Every day | ORAL | Status: DC
  Filled 2015-06-14 (×2): qty 1

## 2015-06-14 MED ORDER — ALUM & MAG HYDROXIDE-SIMETH 200-200-20 MG/5ML PO SUSP: 30.0000 mL | Freq: Four times a day (QID) | ORAL | Status: DC | PRN

## 2015-06-14 MED ORDER — FENOFIBRATE 160 MG PO TABS
160.0000 mg | ORAL_TABLET | Freq: Every day | ORAL | Status: DC
  Filled 2015-06-14 (×2): qty 1

## 2015-06-14 MED ORDER — OMEGA-3 FATTY ACIDS 1000 MG PO CAPS: 1.0000 g | ORAL_CAPSULE | Freq: Two times a day (BID) | ORAL | Status: DC

## 2015-06-14 MED ORDER — ROSUVASTATIN CALCIUM 20 MG PO TABS
20.0000 mg | ORAL_TABLET | Freq: Every day | ORAL | Status: DC
  Administered 2015-06-14 – 2015-06-23 (×9): 20 mg via ORAL
  Filled 2015-06-14 (×10): qty 1

## 2015-06-14 MED ORDER — POTASSIUM CHLORIDE CRYS ER 20 MEQ PO TBCR
20.0000 meq | EXTENDED_RELEASE_TABLET | Freq: Every day | ORAL | Status: DC
  Administered 2015-06-15 – 2015-06-23 (×8): 20 meq via ORAL
  Filled 2015-06-14 (×9): qty 1

## 2015-06-14 MED ORDER — VITAMIN D 1000 UNITS PO TABS
2000.0000 [IU] | ORAL_TABLET | Freq: Every day | ORAL | Status: DC
Start: 2015-06-15 — End: 2015-06-23
  Administered 2015-06-15 – 2015-06-23 (×8): 2000 [IU] via ORAL
  Filled 2015-06-14 (×10): qty 2

## 2015-06-14 MED ORDER — INSULIN ASPART 100 UNIT/ML ~~LOC~~ SOLN: 0.0000 [IU] | Freq: Three times a day (TID) | SUBCUTANEOUS | Status: DC

## 2015-06-14 MED ORDER — SODIUM CHLORIDE 0.9 % IV SOLN
INTRAVENOUS | Status: DC
  Administered 2015-06-14 – 2015-06-15 (×2): via INTRAVENOUS

## 2015-06-14 MED ORDER — ONDANSETRON HCL 4 MG/2ML IJ SOLN: 4.0000 mg | Freq: Four times a day (QID) | INTRAMUSCULAR | Status: DC | PRN

## 2015-06-14 MED ORDER — ACETAMINOPHEN 325 MG PO TABS
650.0000 mg | ORAL_TABLET | Freq: Four times a day (QID) | ORAL | Status: DC | PRN
  Administered 2015-06-16: 650 mg via ORAL
  Filled 2015-06-14: qty 2

## 2015-06-14 MED ORDER — OXYCODONE HCL 5 MG PO TABS
5.0000 mg | ORAL_TABLET | ORAL | Status: DC | PRN
  Administered 2015-06-15 – 2015-06-16 (×5): 5 mg via ORAL
  Filled 2015-06-14 (×5): qty 1

## 2015-06-14 MED ORDER — CLOPIDOGREL BISULFATE 75 MG PO TABS: 75.0000 mg | ORAL_TABLET | Freq: Every day | ORAL | Status: DC

## 2015-06-14 MED ORDER — IPRATROPIUM-ALBUTEROL 0.5-2.5 (3) MG/3ML IN SOLN
3.0000 mL | Freq: Three times a day (TID) | RESPIRATORY_TRACT | Status: DC
  Administered 2015-06-15 – 2015-06-16 (×4): 3 mL via RESPIRATORY_TRACT
  Filled 2015-06-14 (×4): qty 3

## 2015-06-14 MED ORDER — METOPROLOL SUCCINATE ER 50 MG PO TB24
50.0000 mg | ORAL_TABLET | Freq: Every day | ORAL | Status: DC
  Administered 2015-06-14: 50 mg via ORAL
  Filled 2015-06-14 (×2): qty 1

## 2015-06-14 MED ORDER — PANTOPRAZOLE SODIUM 40 MG IV SOLR
40.0000 mg | Freq: Two times a day (BID) | INTRAVENOUS | Status: DC
  Administered 2015-06-14 – 2015-06-19 (×11): 40 mg via INTRAVENOUS
  Filled 2015-06-14 (×12): qty 40

## 2015-06-14 MED ORDER — ACETAMINOPHEN 650 MG RE SUPP
650.0000 mg | Freq: Four times a day (QID) | RECTAL | Status: DC | PRN
Start: 2015-06-14 — End: 2015-06-23

## 2015-06-14 MED ORDER — SODIUM CHLORIDE 0.9 % IV SOLN
Freq: Once | INTRAVENOUS | Status: AC
  Administered 2015-06-14: 21:00:00 via INTRAVENOUS

## 2015-06-14 MED ORDER — OMEGA-3-ACID ETHYL ESTERS 1 G PO CAPS
1.0000 g | ORAL_CAPSULE | Freq: Two times a day (BID) | ORAL | Status: DC
  Administered 2015-06-15 – 2015-06-23 (×16): 1 g via ORAL
  Filled 2015-06-14 (×17): qty 1

## 2015-06-14 MED ORDER — ALLOPURINOL 300 MG PO TABS
300.0000 mg | ORAL_TABLET | Freq: Every day | ORAL | Status: DC
  Administered 2015-06-15 – 2015-06-23 (×8): 300 mg via ORAL
  Filled 2015-06-14 (×9): qty 1

## 2015-06-14 MED ORDER — ONDANSETRON HCL 4 MG PO TABS: 4.0000 mg | ORAL_TABLET | Freq: Four times a day (QID) | ORAL | Status: DC | PRN

## 2015-06-14 MED ORDER — HYDROMORPHONE HCL 1 MG/ML IJ SOLN
0.5000 mg | INTRAMUSCULAR | Status: DC | PRN
  Administered 2015-06-16: 0.5 mg via INTRAVENOUS
  Filled 2015-06-14: qty 1

## 2015-06-14 MED ORDER — INSULIN ASPART 100 UNIT/ML ~~LOC~~ SOLN: 0.0000 [IU] | Freq: Every day | SUBCUTANEOUS | Status: DC

## 2015-06-14 NOTE — ED Provider Notes (Signed)
CSN: [id number]     Arrival date & time 06/14/15  1748 History   First MD Initiated Contact with Patient 06/14/15 1828     Chief Complaint  Patient presents with  . Abdominal Pain     (Consider location/radiation/quality/duration/timing/severity/associated sxs/prior Treatment) Patient is a 74 y.o. male presenting with abdominal pain. The history is provided by the patient.  Abdominal Pain Associated symptoms: no chest pain, no diarrhea, no fever, no nausea, no shortness of breath and no vomiting    patient presents with lower abdominal pain. Also has had some black stool today recent admission to hospital with anemia from presumed GI source. Is on Coumadin and Plavix. Reportedly GI once Plavix stopped but cardiology does not want him to stop it due to multiple stents both coronary and peripheral. May have had a small amount lightheadedness. Discharge from the hospital about a week and half ago and had a hemoglobin of 5 at that time. Had melanoma then but has not had known until today since then. Has had chronic abdominal pain also has some dull lower abdominal pain. No fevers. Chest pain. No trouble breathing.  Past Medical History  Diagnosis Date  . Type 2 diabetes mellitus ([place]) 2007  . Essential hypertension   . Gout   . Hypercholesteremia   . Peripheral vascular disease ([place])     Lower extremity PCI/stenting  . COPD (chronic obstructive pulmonary disease) ([place])   . S/P aortic valve replacement 1990    a. St. Jude  . Chronic back pain   . Dysphagia   . Neuromuscular disorder ([place])   . Peripheral neuropathy ([place])   . Critical lower limb ischemia   . CAD (coronary artery disease)     a. 05/13/14 [name] s/p overlapping DESx2 to SVG to RCA. b.  s/p CABG '90 with redo '94 & stent to RCA SVG in 2005  . Chronic toe ulcer ([place])     a. Left foot  . Chronic systolic heart failure ([place])   . History of kidney stones   . GERD (gastroesophageal reflux disease)   . Arthritis   . Sleep apnea    . Myocardial infarction ([place])   . GI bleeding 05/31/2015    Source not identified.  . Acute blood loss anemia 02/2015 & 05/2015    Hemoglobin 5.5 on 05/31/15; status post transfusion.   Past Surgical History  Procedure Laterality Date  . Aortic valve replacement  1990    St. Jude  . Rotator cuff repair      right  . Cataract extraction      bilateral  . Coronary stent placement  2005    RCA vein graft A 3.0x13.0 TAXUS stent was then placed int he vessel a Viva 3.0x4.0 (perfusion balloon was made ready it was placed through the entire lenght of the stent  . Peripheral vascular procedures lower extremities      right external iliac  artery PTA and stenting as well as bilateral SFA intervention remotely. Repeat procedures in 2011 bilaterally  . Coronary artery bypass graft  1994    6 vessels  . [name]  06/13/2011    Procedure: Venia Minks DILATION;  Surgeon: [name], MD;  Location: [facility];  Service: Endoscopy;  Laterality: N/A;  Dilated to 56.   . Angioplasty illiac artery    . Back surgery  [id number]    2  . Eye surgery    . Amputation Left 07/13/2014    Procedure: Transmetatarsal Amputation;  Surgeon: Beverely Low  Fernanda Drum, MD;  Location: [place];  Service: Orthopedics;  Laterality: Left;  . Lower extremity angiogram N/A 02/15/2013    Procedure: LOWER EXTREMITY ANGIOGRAM;  Surgeon: [name], MD;  Location: [facility] CATH LAB;  Service: Cardiovascular;  Laterality: N/A;  . Left heart catheterization with coronary angiogram N/A 05/11/2014    Procedure: LEFT HEART CATHETERIZATION WITH CORONARY ANGIOGRAM;  Surgeon: [name], MD;  Location[facility] LAB;  Service: Cardiovascular;  Laterality: N/A;  . Percutaneous coronary stent intervention (pci-s) N/A 05/13/2014    Procedure: PERCUTANEOUS CORONARY STENT INTERVENTION (PCI-S);  Surgeon: [name], MD;  Location: [facility] CATH LAB;  Service: Cardiovascular;  Laterality: N/A;  . Lower extremity angiogram N/A 06/06/2014     Procedure: LOWER EXTREMITY ANGIOGRAM;  Surgeon: [name], MD;  Location: [facility] CATH LAB;  Service: Cardiovascular;  Laterality: N/A;  . Amputation Left 08/20/2014    Procedure: Revision Transmetatarsal Amputation versus Below Knee Amputation;  Surgeon: [name], MD;  Location: [place];  Service: Orthopedics;  Laterality: Left;  . Stump revision Left 09/23/2014    Procedure: Revision Left Below Knee Amputation;  Surgeon: [name], MD;  Location: [place];  Service: Orthopedics;  Laterality: Left;  . Stump revision Left 10/13/2014    Procedure: REVISION LEFT BELOW KNEE AMPUTATION STUMP;  Surgeon: [name], MD;  Location: [facility];  Service: Orthopedics;  Laterality: Left;  . Esophagogastroduodenoscopy N/A 02/09/2015    DR. Schooler: Normal EGD  . Esophagogastroduodenoscopy  05/2011    Dr. [name]: s/p esophageal dilation, antral erosions/nodularity with benign biopsies  . Colonoscopy  05/2011    Dr. [name]: benign rectal polyp, left sided tics, ascending colonic ulcers (path c/w ischemia)  . Colonoscopy with propofol N/A 06/01/2015    Procedure: COLONOSCOPY WITH PROPOFOL; IN CECUM AT  0921; WITHDRAWAL TIME 8 MINUTES;  Surgeon: [name], MD;  Location: [facility];  Service: Endoscopy;  Laterality: N/A;  . Givens capsule study N/A 06/08/2015    Procedure: GIVENS CAPSULE STUDY;  Surgeon: [name], MD;  Location: [facility];  Service: Endoscopy;  Laterality: N/A;  0700    Family History  Problem Relation Age of Onset  . Colon cancer Neg Hx   . Liver disease Neg Hx    Social History  Substance Use Topics  . Smoking status: Former Smoker -- 1.00 packs/day for 55 years    Types: Cigarettes    Start date: 08/19/1953    Quit date: 05/07/2014  . Smokeless tobacco: Current User    Types: Chew    Last Attempt to Quit: 08/20/1995     Comment: Has quit on 3 occasions. Counseling given today 5-10 minutes   I am more than likely going to quit "  . Alcohol Use: No     Comment:  Socially, sometimes 12 ounce beer daily, may go month without/no whiskey    Review of Systems  Constitutional: Negative for fever, activity change and appetite change.  Eyes: Negative for pain.  Respiratory: Negative for chest tightness and shortness of breath.   Cardiovascular: Negative for chest pain and leg swelling.  Gastrointestinal: Positive for abdominal pain and blood in stool. Negative for nausea, vomiting and diarrhea.  Musculoskeletal: Negative for back pain and neck stiffness.  Skin: Negative for rash.  Neurological: Negative for weakness, numbness and headaches.  Hematological: Bruises/bleeds easily.  Psychiatric/Behavioral: Negative for behavioral problems.      Allergies  Review of patient's allergies indicates no known allergies.  Home Medications   Prior to Admission medications   Medication Sig Start Date End Date Taking? Authorizing Provider  albuterol (PROVENTIL) 4 MG tablet Take 4 mg by mouth 3 (three) times daily. 01/20/15  Yes Historical Provider, MD  albuterol-ipratropium (COMBIVENT) 18-103 MCG/ACT inhaler Inhale 1 puff into the lungs 4 (four) times daily. Coughing/ Shortness of Breath   Yes Historical Provider, MD  allopurinol (ZYLOPRIM) 300 MG tablet Take 300 mg by mouth daily.   Yes Historical Provider, MD  cholecalciferol (VITAMIN D) 1000 UNITS tablet Take 2,000 Units by mouth daily.   Yes Historical Provider, MD  clopidogrel (PLAVIX) 75 MG tablet Take 75 mg by mouth daily.   Yes Historical Provider, MD  Emollient (EUCERIN) lotion Apply 10 mLs topically as needed for dry skin.   Yes Historical Provider, MD  fenofibrate (TRICOR) 145 MG tablet Take 1 tablet (145 mg total) by mouth daily. NEED OV. 05/19/15  Yes [name], MD  furosemide (LASIX) 40 MG tablet Take 1 tablet (40 mg total) by mouth daily. RESTART ON SATURDAY, 09/03/14. Patient taking differently: Take 80 mg by mouth daily.  06/02/15  Yes Rexene Alberts, MD  glimepiride (AMARYL) 1 MG tablet Take  1 mg by mouth 2 (two) times daily.   Yes Historical Provider, MD  isosorbide mononitrate (IMDUR) 30 MG 24 hr tablet Take 1 tablet (30 mg total) by mouth daily. <PLEASE MAKE APPOINTMENT FOR REFILLS> Patient taking differently: Take 30 mg by mouth daily.  05/19/15  Yes [name], MD  metoprolol succinate (TOPROL XL) 50 MG 24 hr tablet Take 1 tablet (50 mg total) by mouth daily. RESTART ON Saturday 06/04/15. Take with or immediately following a meal. Patient taking differently: Take 50 mg by mouth daily. Take with or immediately following a meal. 06/02/15  Yes Rexene Alberts, MD  nitroGLYCERIN (NITROSTAT) 0.4 MG SL tablet Place 1 tablet (0.4 mg total) under the tongue every 5 (five) minutes as needed for chest pain. 05/17/14  Yes Eileen Stanford, PA-C  oxyCODONE-acetaminophen (PERCOCET) 10-325 MG per tablet Take 1 tablet by mouth every 4 (four) hours as needed for pain. Patient taking differently: Take 1 tablet by mouth every 3 (three) hours as needed for pain.  09/23/14  Yes [name], MD  potassium chloride SA (K-DUR,KLOR-CON) 20 MEQ tablet Take 20 mEq by mouth daily.     Yes Historical Provider, MD  pregabalin (LYRICA) 50 MG capsule Take one capsule by mouth twice daily for pains Patient taking differently: Take 50 mg by mouth 3 (three) times daily.  08/23/14  Yes Mahima Pandey, MD  ramipril (ALTACE) 2.5 MG capsule Take 1 capsule (2.5 mg total) by mouth 2 (two) times daily. Patient taking differently: Take 2.5-5 mg by mouth 2 (two) times daily. Takes '5mg'$  capsule daily in the morning and takes 2.'5mg'$  capsule at bedtime 06/02/15  Yes Rexene Alberts, MD  rosuvastatin (CRESTOR) 20 MG tablet TAKE (1) TABLET BY MOUTH AT BEDTIME FOR CHOLESTEROL. 05/19/15  Yes [name], MD  sitaGLIPtin (JANUVIA) 50 MG tablet Take 50 mg by mouth daily.   Yes Historical Provider, MD  vitamin C (ASCORBIC ACID) 500 MG tablet Take 500 mg by mouth daily.   Yes Historical Provider, MD  warfarin (COUMADIN) 5 MG tablet  Take 1 tablet by mouth daily or as directed by coumadin clinic. Patient taking differently: Take 2.5-5 mg by mouth. 2.'5mg'$  on Mondays and Fridays only. Takes '5mg'$  on all other day 06/02/15  Yes Rexene Alberts, MD  ferrous sulfate 325 (65 FE) MG EC tablet Take 1 tablet (325 mg total) by mouth daily with breakfast. Patient not taking: Reported on 06/08/2015 06/02/15   Rexene Alberts, MD  fish oil-omega-3 fatty acids 1000 MG capsule Take 1 capsule (1 g total) by mouth 2 (two) times daily. Patient not taking: Reported on 06/15/2015 01/22/13   Casimiro Needle [place], RPH-CPP   BP 107/32 mmHg  Pulse 58  Temp(Src) 97.9 F (36.6 C) (Oral)  Resp 17  Ht '5\' 10"'$  (1.778 m)  Wt 200 lb 9.9 oz (91 kg)  BMI 28.79 kg/m2  SpO2 96% Physical Exam  Constitutional: He appears well-developed.  HENT:  Head: Atraumatic.  Eyes: No scleral icterus.  Neck: Neck supple.  Cardiovascular:  Bradycardia  Pulmonary/Chest: Effort normal.  Abdominal: Soft. There is no tenderness.  Musculoskeletal:  Left below-the-knee amputation.  Neurological: He is alert.  Skin: No pallor.    ED Course  Procedures (including critical care time) Labs Review Labs Reviewed  COMPREHENSIVE METABOLIC PANEL - Abnormal; Notable for the following:    Glucose, Bld 134 (*)    BUN 78 (*)    Creatinine, Ser 1.59 (*)    Total Protein 5.9 (*)    Albumin 3.2 (*)    ALT 11 (*)    Alkaline Phosphatase 18 (*)    GFR calc non Af Amer 41 (*)    GFR calc Af Amer 48 (*)    All other components within normal limits  PROTIME-INR - Abnormal; Notable for the following:    Prothrombin Time 30.5 (*)    INR 2.99 (*)    All other components within normal limits  CBC WITH DIFFERENTIAL/PLATELET - Abnormal; Notable for the following:    WBC 11.3 (*)    RBC 3.04 (*)    Hemoglobin 7.3 (*)    HCT 24.3 (*)    MCH 24.0 (*)    RDW 19.9 (*)    All other components within normal limits  HEMOGLOBIN AND HEMATOCRIT, BLOOD - Abnormal; Notable for the following:     Hemoglobin 7.1 (*)    HCT 23.4 (*)    All other components within normal limits  PROTIME-INR - Abnormal; Notable for the following:    Prothrombin Time 30.2 (*)    INR 2.95 (*)    All other components within normal limits  BASIC METABOLIC PANEL - Abnormal; Notable for the following:    Chloride 113 (*)    BUN 76 (*)    Creatinine, Ser 1.53 (*)    Calcium 8.8 (*)    GFR calc non Af Amer 43 (*)    GFR calc Af Amer 50 (*)    All other components within normal limits  CBC - Abnormal; Notable for the following:    RBC 3.20 (*)    Hemoglobin 7.9 (*)    HCT 25.0 (*)    MCH 24.7 (*)    RDW 19.0 (*)    All other components within normal limits  GLUCOSE, CAPILLARY - Abnormal; Notable for the following:    Glucose-Capillary 151 (*)    All other components within normal limits  GLUCOSE, CAPILLARY - Abnormal; Notable for the following:    Glucose-Capillary 132 (*)    All other components within normal limits  MRSA PCR SCREENING  TROPONIN I  GLUCOSE, CAPILLARY  GLUCOSE, CAPILLARY  PROTIME-INR  BASIC METABOLIC PANEL  CBC  SAMPLE TO BLOOD BANK  TYPE AND SCREEN  PREPARE RBC (CROSSMATCH)  PREPARE RBC (CROSSMATCH)  Imaging Review Dg Chest Port 1 View  06/15/2015  CLINICAL DATA:  Abdominal pain.  Admitted for recurrent GI bleed. EXAM: PORTABLE CHEST 1 VIEW COMPARISON:  Radiograph 05/30/2015 FINDINGS: Sternotomy wires overlie stable enlarged cardiac silhouette. Chronic elevation LEFT hemidiaphragm. No effusion infiltrate pneumothorax. IMPRESSION: No acute cardiopulmonary findings. Cardiomegaly and elevation of LEFT hemidiaphragm Electronically Signed   By: Suzy Bouchard M.D.   On: 06/15/2015 16:05   Dg Abd Portable 1v  06/15/2015  CLINICAL DATA:  Abdominal pain.  Recurrent GI bleeding. EXAM: PORTABLE ABDOMEN - 1 VIEW COMPARISON:  None. FINDINGS: The bowel gas pattern is normal. Extensive aortic and visceral arterial vascular calcification noted. IMPRESSION: No acute findings.  Electronically Signed   By: Earle Gell M.D.   On: 06/15/2015 16:10   I have personally reviewed and evaluated these images and lab results as part of my medical decision-making.   EKG Interpretation   Date/Time:  Wednesday June 14 2015 18:26:38 EDT Ventricular Rate:  69 PR Interval:  232 QRS Duration: 130 QT Interval:  406 QTC Calculation: 435 R Axis:   -4 Text Interpretation:  Sinus rhythm with 1st degree A-V block with frequent  Premature ventricular complexes in a pattern of bigeminy Non-specific  intra-ventricular conduction block Inferior infarct , age undetermined T  wave abnormality, consider lateral ischemia  bigeminy has replaced  trigeminy Confirmed by Alvino Chapel  MD, Ovid Curd [phone]) on 06/14/2015 6:37:53  PM      MDM   Final diagnoses:  Gastrointestinal hemorrhage, unspecified gastritis, unspecified gastrointestinal hemorrhage type  Anemia, unspecified anemia type    Patient with GI bleeding. History of same. Has reported gastric ulcer. On Coumadin and Plavix due to multiple stents both peripheral and central. Admit to internal medicine.    Davonna Belling, MD 06/15/15 [phone]

## 2015-06-14 NOTE — Telephone Encounter (Signed)
Pt is going to have his daughter check his medications and make sure he is not taking plavix.

## 2015-06-14 NOTE — Telephone Encounter (Signed)
Pt is aware. He said he would go to the hospital now. He is also going to have his daughter check his medications and make sure he is not on plavix.

## 2015-06-14 NOTE — ED Notes (Signed)
Pt has been c/o abdominal pain and black stools for the past few days.  States that he called his doctor and she advised that he should not still be taking coumadin and plavix, yet pt has been for a while now.  Was recently admitted with GI bleed and Hgb of 5.

## 2015-06-14 NOTE — H&P (Signed)
[facility] Admission History and Physical       NALU TROUBLEFIELD NOM:[id number] DOB: 1941/07/18 DOA: 06/14/2015  Referring physician: EDP PCP: [name], MD  Specialists:   Chief Complaint: Lower ABD Pain and Black Stools  HPI: Tanner Pena is a 74 y.o. 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  His daughter called his PCP and he was advised to come to the ED.  He had a recent hospitalization(10/11-10/14/2016) for GI bleeding and underwent a GI workup and had to be transfused 3 units due to a hemoglobin level of 5.5, and on discharge 06/02/2015 his hemoglobin was 8.1,  today his initial Hb = 7.3.     He had an outpatient capsule endoscopy which revealed an ulcer in the Small bowel however he has take his antiplatelet Rx due to his cardiac stents, and his Anticoagulant Rx due to his mechanical AVR.  His  INR today is 2.99. And his Coumadin has been placed on hold and he is being transfused 1 unit of blood and being place on IV Protonix Rx.   He is being admitted to the [facility] Unit.      Review of Systems: Constitutional: No Weight Loss, No Weight Gain, Night Sweats, Fevers, Chills, Dizziness, Light Headedness, Fatigue, or Generalized Weakness HEENT: No Headaches, Difficulty Swallowing,Tooth/Dental Problems,Sore Throat,  No Sneezing, Rhinitis, Ear Ache, Nasal Congestion, or Post Nasal Drip,  Cardio-vascular:  No Chest pain, Orthopnea, PND, Edema in Lower Extremities, Anasarca, Dizziness, Palpitations  Resp: No Dyspnea, No DOE, No Productive Cough, No Non-Productive Cough, No Hemoptysis, No Wheezing.    GI: No Heartburn, Indigestion, +Abdominal Pain, Nausea, Vomiting, Diarrhea, Constipation, Hematemesis, Hematochezia, +Melena, Change in Bowel Habits,  Loss of Appetite  GU: No Dysuria, No Change in Color of Urine, No Urgency or  Urinary Frequency, No Flank pain.  Musculoskeletal: No Joint Pain or Swelling, No Decreased Range of Motion, No Back Pain.  Neurologic: No Syncope, No Seizures, Muscle Weakness, Paresthesia, Vision Disturbance or Loss, No Diplopia, No Vertigo, No Difficulty Walking,  Skin: No Rash or Lesions. Psych: No Change in Mood or Affect, No Depression or Anxiety, No Memory loss, No Confusion, or Hallucinations   Past Medical History  Diagnosis Date  . Type 2 diabetes mellitus ([place]) 2007  . Essential hypertension   . Gout   . Hypercholesteremia   . Peripheral vascular disease ([place])     Lower extremity PCI/stenting  . COPD (chronic obstructive pulmonary disease) ([place])   . S/P aortic valve replacement 1990    a. St. Jude  . Chronic back pain   . Dysphagia   . Neuromuscular disorder ([place])   . Peripheral neuropathy ([place])   . Critical lower limb ischemia   . CAD (coronary artery disease)     a. 05/13/14 [name] s/p overlapping DESx2 to SVG to RCA. b.  s/p CABG '90 with redo '94 & stent to RCA SVG in 2005  . Chronic toe ulcer ([place])     a. Left foot  . Chronic systolic heart failure ([place])   . History of kidney stones   . GERD (gastroesophageal reflux disease)   . Arthritis   . Sleep apnea   . Myocardial infarction ([place])   . GI bleeding 05/31/2015    Source not identified.  . Acute blood loss anemia 02/2015 & 05/2015  Hemoglobin 5.5 on 05/31/15; status post transfusion.     Past Surgical History  Procedure Laterality Date  . Aortic valve replacement  1990    St. Jude  . Rotator cuff repair      right  . Cataract extraction      bilateral  . Coronary stent placement  2005    RCA vein graft A 3.0x13.0 TAXUS stent was then placed int he vessel a Viva 3.0x4.0 (perfusion balloon was made ready it was placed through the entire lenght of the stent  . Peripheral vascular procedures lower extremities      right external iliac  artery PTA and stenting as well as bilateral SFA intervention remotely.  Repeat procedures in 2011 bilaterally  . Coronary artery bypass graft  1994    6 vessels  . [name]  06/13/2011    Procedure: Venia Minks DILATION;  Surgeon: [name], MD;  Location: [facility];  Service: Endoscopy;  Laterality: N/A;  Dilated to 56.   . Angioplasty illiac artery    . Back surgery  [id number]    2  . Eye surgery    . Amputation Left 07/13/2014    Procedure: Transmetatarsal Amputation;  Surgeon: [name], MD;  Location: [place];  Service: Orthopedics;  Laterality: Left;  . Lower extremity angiogram N/A 02/15/2013    Procedure: LOWER EXTREMITY ANGIOGRAM;  Surgeon: [name], MD;  Location: [facility] CATH LAB;  Service: Cardiovascular;  Laterality: N/A;  . Left heart catheterization with coronary angiogram N/A 05/11/2014    Procedure: LEFT HEART CATHETERIZATION WITH CORONARY ANGIOGRAM;  Surgeon: [name], MD;  Location: [facility] CATH LAB;  Service: Cardiovascular;  Laterality: N/A;  . Percutaneous coronary stent intervention (pci-s) N/A 05/13/2014    Procedure: PERCUTANEOUS CORONARY STENT INTERVENTION (PCI-S);  Surgeon: [name], MD;  Location: [facility] CATH LAB;  Service: Cardiovascular;  Laterality: N/A;  . Lower extremity angiogram N/A 06/06/2014    Procedure: LOWER EXTREMITY ANGIOGRAM;  Surgeon: [name], MD;  Location: [facility] CATH LAB;  Service: Cardiovascular;  Laterality: N/A;  . Amputation Left 08/20/2014    Procedure: Revision Transmetatarsal Amputation versus Below Knee Amputation;  Surgeon: [name], MD;  Location: [place];  Service: Orthopedics;  Laterality: Left;  . Stump revision Left 09/23/2014    Procedure: Revision Left Below Knee Amputation;  Surgeon: [name], MD;  Location: [place];  Service: Orthopedics;  Laterality: Left;  . Stump revision Left 10/13/2014    Procedure: REVISION LEFT BELOW KNEE AMPUTATION STUMP;  Surgeon: [name], MD;  Location: [facility];  Service: Orthopedics;  Laterality: Left;  .  Esophagogastroduodenoscopy N/A 02/09/2015    DR. Schooler: Normal EGD  . Esophagogastroduodenoscopy  05/2011    Dr. [name]: s/p esophageal dilation, antral erosions/nodularity with benign biopsies  . Colonoscopy  05/2011    Dr. [name]: benign rectal polyp, left sided tics, ascending colonic ulcers (path c/w ischemia)  . Colonoscopy with propofol N/A 06/01/2015    Procedure: COLONOSCOPY WITH PROPOFOL; IN CECUM AT  0921; WITHDRAWAL TIME 8 MINUTES;  Surgeon: [name], MD;  Location: [facility];  Service: Endoscopy;  Laterality: N/A;  . Givens capsule study N/A 06/08/2015    Procedure: GIVENS CAPSULE STUDY;  Surgeon: [name], MD;  Location: [facility] ENDO SUITE;  Service: Endoscopy;  Laterality: N/A;  0700       Prior to Admission medications   Medication Sig Start Date End Date Taking? Authorizing Provider  albuterol (PROVENTIL) 4  MG tablet Take 4 mg by mouth 3 (three) times daily. 01/20/15  Yes Historical Provider, MD  albuterol-ipratropium (COMBIVENT) 18-103 MCG/ACT inhaler Inhale 1 puff into the lungs 4 (four) times daily. Coughing/ Shortness of Breath   Yes Historical Provider, MD  allopurinol (ZYLOPRIM) 300 MG tablet Take 300 mg by mouth daily.   Yes Historical Provider, MD  cholecalciferol (VITAMIN D) 1000 UNITS tablet Take 2,000 Units by mouth daily.   Yes Historical Provider, MD  clopidogrel (PLAVIX) 75 MG tablet Take 75 mg by mouth daily.   Yes Historical Provider, MD  Emollient (EUCERIN) lotion Apply 10 mLs topically as needed for dry skin.   Yes Historical Provider, MD  fenofibrate (TRICOR) 145 MG tablet Take 1 tablet (145 mg total) by mouth daily. NEED OV. 05/19/15  Yes [name], MD  fish oil-omega-3 fatty acids 1000 MG capsule Take 1 capsule (1 g total) by mouth 2 (two) times daily. 01/22/13  Yes Kristin L Alvstad, RPH-CPP  furosemide (LASIX) 40 MG tablet Take 1 tablet (40 mg total) by mouth daily. RESTART ON SATURDAY, 09/03/14. Patient taking differently: Take 80 mg by mouth  daily.  06/02/15  Yes Rexene Alberts, MD  glimepiride (AMARYL) 1 MG tablet Take 1 mg by mouth 2 (two) times daily.   Yes Historical Provider, MD  isosorbide mononitrate (IMDUR) 30 MG 24 hr tablet Take 1 tablet (30 mg total) by mouth daily. <PLEASE MAKE APPOINTMENT FOR REFILLS> Patient taking differently: Take 30 mg by mouth daily.  05/19/15  Yes [name], MD  metoprolol succinate (TOPROL XL) 50 MG 24 hr tablet Take 1 tablet (50 mg total) by mouth daily. RESTART ON Saturday 06/04/15. Take with or immediately following a meal. Patient taking differently: Take 50 mg by mouth daily. Take with or immediately following a meal. 06/02/15  Yes Rexene Alberts, MD  nitroGLYCERIN (NITROSTAT) 0.4 MG SL tablet Place 1 tablet (0.4 mg total) under the tongue every 5 (five) minutes as needed for chest pain. 05/17/14  Yes Eileen Stanford, PA-C  oxyCODONE-acetaminophen (PERCOCET) 10-325 MG per tablet Take 1 tablet by mouth every 4 (four) hours as needed for pain. Patient taking differently: Take 1 tablet by mouth every 3 (three) hours as needed for pain.  09/23/14  Yes [name], MD  potassium chloride SA (K-DUR,KLOR-CON) 20 MEQ tablet Take 20 mEq by mouth daily.     Yes Historical Provider, MD  pregabalin (LYRICA) 50 MG capsule Take one capsule by mouth twice daily for pains Patient taking differently: Take 50 mg by mouth 3 (three) times daily.  08/23/14  Yes Mahima Pandey, MD  ramipril (ALTACE) 2.5 MG capsule Take 1 capsule (2.5 mg total) by mouth 2 (two) times daily. Patient taking differently: Take 2.5-5 mg by mouth 2 (two) times daily. Takes '5mg'$  capsule daily in the morning and takes 2.'5mg'$  capsule at bedtime 06/02/15  Yes Rexene Alberts, MD  rosuvastatin (CRESTOR) 20 MG tablet TAKE (1) TABLET BY MOUTH AT BEDTIME FOR CHOLESTEROL. 05/19/15  Yes [name], MD  sitaGLIPtin (JANUVIA) 50 MG tablet Take 50 mg by mouth daily.   Yes Historical Provider, MD  vitamin C (ASCORBIC ACID) 500 MG tablet Take 500 mg by  mouth daily.   Yes Historical Provider, MD  warfarin (COUMADIN) 5 MG tablet Take 1 tablet by mouth daily or as directed by coumadin clinic. Patient taking differently: Take 2.5-5 mg by mouth. 2.'5mg'$  on Mondays and Fridays only. Takes '5mg'$  on all other day 06/02/15  Yes  Rexene Alberts, MD  ferrous sulfate 325 (65 FE) MG EC tablet Take 1 tablet (325 mg total) by mouth daily with breakfast. Patient not taking: Reported on 06/08/2015 06/02/15   Rexene Alberts, MD     No Known Allergies  Social History:  reports that he quit smoking about 13 months ago. His smoking use included Cigarettes. He started smoking about 61 years ago. He has a 55 pack-year smoking history. His smokeless tobacco use includes Chew. He reports that he does not drink alcohol or use illicit drugs.    Family History  Problem Relation Age of Onset  . Colon cancer Neg Hx   . Liver disease Neg Hx        Physical Exam:  GEN:  Pleasant Elderly Well Nourished and Well Developed 74 y.o. Caucasian male examined and in no acute distress; cooperative with exam Filed Vitals:   06/14/15 1945 06/14/15 2000 06/14/15 2015 06/14/15 2030  BP: 119/42 110/56 138/45 125/43  Pulse: 68 58 38   Temp:      TempSrc:      Resp: '16 19 18 18  '$ Height:      Weight:      SpO2: 100% 99% 98%    Blood pressure 125/43, pulse 38, temperature 97.6 F (36.4 C), temperature source Oral, resp. rate 18, height '5\' 10"'$  (1.778 m), weight 93.441 kg (206 lb), SpO2 98 %. PSYCH: SHe is alert and oriented x4; does not appear anxious does not appear depressed; affect is normal HEENT: Normocephalic and Atraumatic, Mucous membranes pink; PERRLA; EOM intact; Fundi:  Benign;  No scleral icterus, Nares: Patent, Oropharynx: Clear, Edentulous,    Neck:  FROM, No Cervical Lymphadenopathy nor Thyromegaly or Carotid Bruit; No JVD; Breasts:: Not examined CHEST WALL: No tenderness CHEST: Normal respiration, clear to auscultation bilaterally HEART: Regular rate and rhythm; no  murmurs rubs or gallops BACK: No kyphosis or scoliosis; No CVA tenderness ABDOMEN: Positive Bowel Sounds, Soft Non-Tender, No Rebound or Guarding; No Masses, No Organomegaly, No Pannus; No Intertriginous candida. Rectal Exam: Not done EXTREMITIES:  +Left AKA,  RLE:   No Cyanosis, Clubbing, or Edema; No Ulcerations. Genitalia: not examined PULSES: 2+ and symmetric SKIN: Normal hydration no rash or ulceration CNS:  Alert and Oriented x 4, No Focal Deficits Vascular: pulses palpable throughout    Labs on Admission:  Basic Metabolic Panel:  Recent Labs Lab 06/14/15 1859  NA 142  K 5.0  CL 111  CO2 26  GLUCOSE 134*  BUN 78*  CREATININE 1.59*  CALCIUM 9.2   Liver Function Tests:  Recent Labs Lab 06/14/15 1859  AST 16  ALT 11*  ALKPHOS 18*  BILITOT 0.7  PROT 5.9*  ALBUMIN 3.2*   No results for input(s): LIPASE, AMYLASE in the last 168 hours. No results for input(s): AMMONIA in the last 168 hours. CBC:  Recent Labs Lab 06/14/15 1859  WBC 11.3*  NEUTROABS 7.4  HGB 7.3*  HCT 24.3*  MCV 79.9  PLT 366   Cardiac Enzymes:  Recent Labs Lab 06/14/15 1859  TROPONINI 0.03    BNP (last 3 results)  Recent Labs  12/28/14 0134 02/04/15 0413  BNP 504.3* 593.2*    ProBNP (last 3 results) No results for input(s): PROBNP in the last 8760 hours.  CBG: No results for input(s): GLUCAP in the last 168 hours.  Radiological Exams on Admission: No results found.   EKG: Independently reviewed.        Assessment/Plan:      74 y.o.  male with  Principal Problem:   1.   GI bleed   Hold Coumadin Rx    Administer Vitamin K if needed    Monitor Pt/INR daily   IV Protonix   Monitor H/Hs   Transfuse PRN   Gi consult in AM      Active Problems:   2.   Acute blood loss anemia   Transfuse PRN   Monitor H/Hs     3.   Chronic anticoagulation- due to Mechanical AVR   Coumadin temporarily on Hold     4.   Coronary artery disease involving native coronary artery  of native heart with angina pectoris (HCC)   Multiple Stents- on Plavix Rx     5.   H/O aortic valve replacement- St Jude   On Coumadin  Rx for Life     6.   PVD prior SFA PTA with chronic LE disease, not amenable to PTA   On Plavix Rx     7.   DM2 (diabetes mellitus, type 2) (HCC)   SSI coverage PRN   Hold Glipizide Rx       8.   Hyperlipidemia with target LDL less than 70   Continue Crestor, Tricor, Rx.       9.   COPD (chronic obstructive pulmonary disease) (HCC)   Duonebs PRN   10.   Chronic systolic heart failure (HCC)   Monitor for Fluid Overload    Strict I/Os   Continue Lasix and KCl Rx daily   11.   Essential hypertension   Continue Metoprolol Rx, and lasix Rx as BP tolerates   Monitor BPs   12.   DVT Prophylaxis   On Coumadin  Rx      Code Status:     FULL CODE      Family Communication:   Family at Bedside    Disposition Plan:    Inpatient Status        Time spent:  [facility] Pager [phone]   If [place] Please Contact the Day Rounding Team MD for [facility]  If 7PM-7AM, Please Contact Night-Floor Coverage  [email or web address] Password [email or web address] 06/14/2015, 9:24 PM     ADDENDUM:   Patient was seen and examined on 06/14/2015

## 2015-06-14 NOTE — Telephone Encounter (Signed)
PT DAUGHTER CALLED AND STATED THAT HER FATHER'S STOOL HAS TURNED BLACK AGAIN TODAY    DOES HE NEED TO BE SEEN?  PLEASE ADVISE [phone]

## 2015-06-14 NOTE — Telephone Encounter (Signed)
Addressed by another provider earlier today. Patient has been scheduled with me on 06/19/15 in an urgent visit.   However, it appears he is having lower abdominal pain, some nausea, afebrile, no vomiting, and melena. Not taking pepto.   Has had recent colonoscopy and capsule study. On capsule study appeared to have an ulcer. He has multiple health issues. Poor historian. SHOULD NOT BE ON PLAVIX ANYMORE.   Last admission he had profound anemia with Hgb 5. I would feel more comfortable if he went to the ED and had blood work done there and triaged. IF he is stable after being seen in the ED, could keep the appt with me upcoming to arrange EGD. However, he may have acute blood loss anemia.   IF he refuses to go to the ED, have stat CBC on 10/27 and have another provider cover. I will be out of office Thursday/Friday.  Orvil Feil, ANP-BC [facility]

## 2015-06-15 ENCOUNTER — Inpatient Hospital Stay (HOSPITAL_COMMUNITY): Payer: Medicare Other

## 2015-06-15 ENCOUNTER — Ambulatory Visit: Payer: Medicare Other | Admitting: *Deleted

## 2015-06-15 DIAGNOSIS — Z954 Presence of other heart-valve replacement: Secondary | ICD-10-CM

## 2015-06-15 DIAGNOSIS — I25119 Atherosclerotic heart disease of native coronary artery with unspecified angina pectoris: Secondary | ICD-10-CM

## 2015-06-15 DIAGNOSIS — K921 Melena: Secondary | ICD-10-CM

## 2015-06-15 DIAGNOSIS — D62 Acute posthemorrhagic anemia: Secondary | ICD-10-CM

## 2015-06-15 DIAGNOSIS — K254 Chronic or unspecified gastric ulcer with hemorrhage: Secondary | ICD-10-CM

## 2015-06-15 DIAGNOSIS — Z7901 Long term (current) use of anticoagulants: Secondary | ICD-10-CM

## 2015-06-15 DIAGNOSIS — Z5181 Encounter for therapeutic drug level monitoring: Secondary | ICD-10-CM

## 2015-06-15 DIAGNOSIS — I5022 Chronic systolic (congestive) heart failure: Secondary | ICD-10-CM

## 2015-06-15 DIAGNOSIS — E785 Hyperlipidemia, unspecified: Secondary | ICD-10-CM

## 2015-06-15 DIAGNOSIS — I10 Essential (primary) hypertension: Secondary | ICD-10-CM

## 2015-06-15 DIAGNOSIS — I739 Peripheral vascular disease, unspecified: Secondary | ICD-10-CM

## 2015-06-15 DIAGNOSIS — Z955 Presence of coronary angioplasty implant and graft: Secondary | ICD-10-CM

## 2015-06-15 LAB — BASIC METABOLIC PANEL
ANION GAP: 7 (ref 5–15)
BUN: 76 mg/dL — ABNORMAL HIGH (ref 6–20)
CHLORIDE: 113 mmol/L — AB (ref 101–111)
CO2: 24 mmol/L (ref 22–32)
Calcium: 8.8 mg/dL — ABNORMAL LOW (ref 8.9–10.3)
Creatinine, Ser: 1.53 mg/dL — ABNORMAL HIGH (ref 0.61–1.24)
GFR calc Af Amer: 50 mL/min — ABNORMAL LOW (ref >60)
GFR, EST NON AFRICAN AMERICAN: 43 mL/min — AB (ref >60)
Glucose, Bld: 90 mg/dL (ref 65–99)
POTASSIUM: 4.1 mmol/L (ref 3.5–5.1)
SODIUM: 144 mmol/L (ref 135–145)

## 2015-06-15 LAB — CBC
HEMATOCRIT: 25 % — AB (ref 39.0–52.0)
HEMOGLOBIN: 7.9 g/dL — AB (ref 13.0–17.0)
MCH: 24.7 pg — ABNORMAL LOW (ref 26.0–34.0)
MCHC: 31.6 g/dL (ref 30.0–36.0)
MCV: 78.1 fL (ref 78.0–100.0)
Platelets: 275 10*3/uL (ref 150–400)
RBC: 3.2 MIL/uL — AB (ref 4.22–5.81)
RDW: 19 % — ABNORMAL HIGH (ref 11.5–15.5)
WBC: 9.5 10*3/uL (ref 4.0–10.5)

## 2015-06-15 LAB — GLUCOSE, CAPILLARY
GLUCOSE-CAPILLARY: 151 mg/dL — AB (ref 65–99)
Glucose-Capillary: 101 mg/dL — ABNORMAL HIGH (ref 65–99)
Glucose-Capillary: 132 mg/dL — ABNORMAL HIGH (ref 65–99)
Glucose-Capillary: 86 mg/dL (ref 65–99)

## 2015-06-15 LAB — PROTIME-INR
INR: 2.95 — ABNORMAL HIGH (ref 0.00–1.49)
PROTHROMBIN TIME: 30.2 s — AB (ref 11.6–15.2)

## 2015-06-15 LAB — PREPARE RBC (CROSSMATCH)

## 2015-06-15 MED ORDER — INSULIN ASPART 100 UNIT/ML ~~LOC~~ SOLN: 0.0000 [IU] | Freq: Four times a day (QID) | SUBCUTANEOUS | Status: DC

## 2015-06-15 MED ORDER — SODIUM CHLORIDE 0.9 % IV SOLN: Freq: Once | INTRAVENOUS | Status: DC

## 2015-06-15 MED ORDER — INSULIN ASPART 100 UNIT/ML ~~LOC~~ SOLN
0.0000 [IU] | Freq: Four times a day (QID) | SUBCUTANEOUS | Status: DC
  Administered 2015-06-15: 1 [IU] via SUBCUTANEOUS

## 2015-06-15 NOTE — Progress Notes (Signed)
PROGRESS NOTE  Tanner Pena BDZ:[id number] DOB: [birth year] DOA: 06/14/2015 PCP: [name], MD  Summary: 59 yom PMH of CAD with multiple stents (on plavix) and mechanical AVR (on coumadin) presented with melena. Patient had a recent hospitalization(10/11-10/14/2016) for GI bleeding and underwent a GI workup. He had to be transfused 3 units due to a hemoglobin level of 5.5. Upon discharge on 06/02/2015 his hemoglobin was 8.1. He had an outpatient capsule endoscopy which revealed an ulcer in the small bowel however he reported he has been compliant with his Plavix and Coumadine due to his hx of cardiac stents and mechanical AVR.  While in the ED, his initial hgb was 7.3 and INR was 2.99.  He was transfused 1U and placed on IV Protonix. He was admitted for further management.     Assessment/Plan: 1. GI bleed, suspect upper with elevated BUN and recent capsule study results. Complicated by warfarin and Plavix. Hgb 7.9 s/p transfusion of 1U PRBCs on 10/26. 10/20 Givens capsule: Several images of abnormal appearing gastric mucosa including possible erosions, edematous/polypoid mucosa. Also there is what appears to be a gastric ulcer with dried blood as well as possible linear erosions just before transitioning to the duodenum. GI consulted and recommends upper endoscopy once INR is below 1.8. Cardiology consulted for further anticoagulation recommendations.  2. ABLA. Transfuse as needed and monitor. Hgb 7.9 today.  3. 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  4. CAD with multiple stents 5. PMH mechanical aortic valve replacement on life long warfarin. 6. PVD s/p SMA stent 02/2015, last seen by Dr. Earleen Newport 10/18, per documentation recommended against antiplatelet agent. 7. AKI.  8. DM type 2, stable. Continue SSI. 9. HLD, continue statin. 10. COPD, stable without evidence of exacerbation.  11. Chronic systolic heart failure, appears compensated. 12. Essential HTN, continue home meds and  monitor. Appears stable.    Overall improved.    Continue PPI,  trend Hgb, check AXR but has no pain on exam and history is chronic  Transfuse second unit now. T/C 2 additional units  Daily INR, will need heparin when INR falls  Hold ASA/Plavix.  Consult cardiology for further recs re: Plavix and wafarin in context of need for EGD.  Remain in SDU  Code Status: Full DVT prophylaxis: SCDs Family Communication: Discussed with patient who understands and has no concerns at this time. Disposition Plan: Continue to monitor in ICU.  [name], MD  [facility]  Pager [phone] If 7PM-7AM, please contact night-coverage at [email or web address], password [facility] 06/15/2015, 6:34 AM  LOS: 1 day   Consultants:  GI  Procedures:  Transfused 1U PRBCs on 10/26  Antibiotics:    HPI/Subjective: Feels good today. Denies any nausea, vomiting, leg pain, or CP. Has some abdominal pain but reports it has been going on for quite a while. Denies any worsening in quality. Has mild SOB.   Objective: Filed Vitals:   06/15/15 0200 06/15/15 0300 06/15/15 0400 06/15/15 0500  BP: 1'15/42 97/51 88/43 '$   Pulse: 53 50 55   Temp:   98.8 F (37.1 C)   TempSrc:   Oral   Resp: '16 18 16   '$ Height:      Weight:    91 kg (200 lb 9.9 oz)  SpO2: 100% 100% 99%     Intake/Output Summary (Last 24 hours) at 06/15/15 0634 Last data filed at 06/15/15 0500  Gross per 24 hour  Intake 756.25 ml  Output    600 ml  Net 156.25 ml  Filed Weights   06/14/15 1810 06/14/15 2151 06/15/15 0500  Weight: 93.441 kg (206 lb) 91.4 kg (201 lb 8 oz) 91 kg (200 lb 9.9 oz)    Exam:     VSS, afebrile, not hypoxic General:  Appears calm and comfortable Eyes: PERRL, normal lids, irises & conjunctiva ENT: grossly normal hearing, lips & tongue Neck: no LAD, masses or thyromegaly Cardiovascular: RRR, click 2/6 holosystolic murmur, No r/g. Left BKA, no RLE edema Telemetry: SR, no arrhythmias  Respiratory: CTA  bilaterally, no w/r/r. Normal respiratory effort. Abdomen: soft, ntnd. Umbilical hernia that is soft, easily reducible, and non-tender.  Skin: no rash or induration seen on limited exam Musculoskeletal: Left BKA appears normal, right foot is warm and dry with normal perfusion.  Psychiatric: grossly normal mood and affect, speech fluent and appropriate Neurologic: grossly non-focal.  New data reviewed:  Hgb 7.9 s/p 1U PRBCs, platelets stable at 275. CBC otherwise unremarkable  Creatinine improved 1.53, BUN 76.   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 allopurinol  300 mg Oral Daily  . cholecalciferol  2,000 Units Oral Daily  . clopidogrel  75 mg Oral Daily  . fenofibrate  160 mg Oral Daily  . furosemide  80 mg Oral Daily  . insulin aspart  0-5 Units Subcutaneous QHS  . insulin aspart  0-9 Units Subcutaneous TID WC  . ipratropium-albuterol  3 mL Nebulization TID  . isosorbide mononitrate  30 mg Oral Daily  . metoprolol succinate  50 mg Oral Daily  . omega-3 acid ethyl esters  1 g Oral BID  . pantoprazole (PROTONIX) IV  40 mg Intravenous Q12H  . potassium chloride SA  20 mEq Oral Daily  . rosuvastatin  20 mg Oral Daily   Continuous Infusions: . sodium chloride 75 mL/hr at 06/14/15 2203    Principal Problem:   GI bleed Active Problems:   H/O aortic valve replacement- St Jude   PVD prior SFA PTA with chronic LE disease, not amenable to PTA   DM2 (diabetes mellitus, type 2) (HCC)   Hyperlipidemia with target LDL less than 70   COPD (chronic obstructive pulmonary disease) (HCC)   Chronic anticoagulation   Acute blood loss anemia   Chronic systolic heart failure ([place])   Coronary artery disease involving native coronary artery of native heart with angina pectoris ([place])   Essential hypertension   Time spent 20 minutes  By signing my name below, I, Rosalie Doctor attest that this documentation has  been prepared under the direction and in the presence of [name], MD Electronically signed: Rosalie Doctor, Scribe. 06/15/2015 9:31am  I personally performed the services described in this documentation. All medical record entries made by the scribe were at my direction. I have reviewed the chart and agree that the record reflects my personal performance and is accurate and complete. [name], MD

## 2015-06-15 NOTE — Consult Note (Signed)
Referring Provider: Samuella Cota, MD Primary Care Physician:  [name], MD Primary Gastroenterologist:  Garfield Cornea, MD  Reason for Consultation:  melena  HPI: Tanner Pena is a 74 y.o. 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 Notable for IDA, ischemic colonic ulcers on colonoscopy in 2012. EGD June 2016 by Dr. Michail Sermon was normal. Presented to the hospital couple weeks ago with profound anemia. Hemoglobin 5.5 at that time. Colonoscopy 06/01/2015 showed colonic diverticulosis but otherwise unremarkable. Small bowel capsule endoscopy on 06/08/2015 showed abnormal appearing gastric mucosa with possible erosions. Possible gastric ulcer with dried blood present. There was edematous/polypoid quality to some of the gastric mucosa and some linear erosions present. Small bowel appeared to be normal. Plans was for upper endoscopy in the near future.  Patient has been a difficult historian. He has been advised multiple occasions to stop the Plavix and continue Coumadin only. Daughter was unclear whether he was still taking the Plavix.  He was advised to come to emergency department last night because reported melena. On presentation his hemoglobin was 7.3. 2 weeks ago it was 8.1. INR 2.99 yesterday. BUN markedly elevated at 78, creatinine 1.59. He received 1 unit of packed red blood cells. Hemoglobin up to 7.9. INR 2.95.  Patient continues to have intermittent abdominal pain. States is predominantly in the lower abdomen. He denies constipation or diarrhea. 2 black stools yesterday. No bright red blood per rectum. He has had nausea with dry heaves. No vomiting. Complains of difficulty swallowing his pills and solid foods. "My jaw gets tired of chewing". Denies heartburn.   Prior to Admission medications    Medication Sig Start Date End Date Taking? Authorizing Provider  albuterol (PROVENTIL) 4 MG tablet Take 4 mg by mouth 3 (three) times daily. 01/20/15  Yes Historical Provider, MD  albuterol-ipratropium (COMBIVENT) 18-103 MCG/ACT inhaler Inhale 1 puff into the lungs 4 (four) times daily. Coughing/ Shortness of Breath   Yes Historical Provider, MD  allopurinol (ZYLOPRIM) 300 MG tablet Take 300 mg by mouth daily.   Yes Historical Provider, MD  cholecalciferol (VITAMIN D) 1000 UNITS tablet Take 2,000 Units by mouth daily.   Yes Historical Provider, MD  clopidogrel (PLAVIX) 75 MG tablet Take 75 mg by mouth daily.   Yes Historical Provider, MD  Emollient (EUCERIN) lotion Apply 10 mLs topically as needed for dry skin.   Yes Historical Provider, MD  fenofibrate (TRICOR) 145 MG tablet Take 1 tablet (145 mg total) by mouth daily. NEED OV. 05/19/15  Yes [name], MD  fish oil-omega-3 fatty acids 1000 MG capsule Take 1 capsule (1 g total) by mouth 2 (two) times daily. 01/22/13  Yes Kristin L Alvstad, RPH-CPP  furosemide (LASIX) 40 MG tablet Take 1 tablet (40 mg total) by mouth daily. RESTART ON SATURDAY, 09/03/14. Patient taking differently: Take 80 mg by mouth daily.  06/02/15  Yes Rexene Alberts, MD  glimepiride (AMARYL) 1 MG tablet Take 1 mg by mouth 2 (two) times daily.   Yes Historical Provider, MD  isosorbide mononitrate (IMDUR) 30 MG 24 hr tablet Take 1 tablet (30 mg total) by mouth daily. <PLEASE MAKE APPOINTMENT FOR REFILLS> Patient taking differently: Take 30 mg by mouth daily.  05/19/15  Yes [name], MD  metoprolol succinate (TOPROL XL) 50 MG 24 hr tablet Take 1 tablet (50 mg total) by mouth daily. RESTART  ON Saturday 06/04/15. Take with or immediately following a meal. Patient taking differently: Take 50 mg by mouth daily. Take with or immediately following a meal. 06/02/15  Yes Rexene Alberts, MD  nitroGLYCERIN (NITROSTAT) 0.4 MG SL tablet Place 1 tablet (0.4 mg total) under the tongue  every 5 (five) minutes as needed for chest pain. 05/17/14  Yes Eileen Stanford, PA-C  oxyCODONE-acetaminophen (PERCOCET) 10-325 MG per tablet Take 1 tablet by mouth every 4 (four) hours as needed for pain. Patient taking differently: Take 1 tablet by mouth every 3 (three) hours as needed for pain.  09/23/14  Yes [name], MD  potassium chloride SA (K-DUR,KLOR-CON) 20 MEQ tablet Take 20 mEq by mouth daily.     Yes Historical Provider, MD  pregabalin (LYRICA) 50 MG capsule Take one capsule by mouth twice daily for pains Patient taking differently: Take 50 mg by mouth 3 (three) times daily.  08/23/14  Yes Mahima Pandey, MD  ramipril (ALTACE) 2.5 MG capsule Take 1 capsule (2.5 mg total) by mouth 2 (two) times daily. Patient taking differently: Take 2.5-5 mg by mouth 2 (two) times daily. Takes '5mg'$  capsule daily in the morning and takes 2.'5mg'$  capsule at bedtime 06/02/15  Yes Rexene Alberts, MD  rosuvastatin (CRESTOR) 20 MG tablet TAKE (1) TABLET BY MOUTH AT BEDTIME FOR CHOLESTEROL. 05/19/15  Yes [name], MD  sitaGLIPtin (JANUVIA) 50 MG tablet Take 50 mg by mouth daily.   Yes Historical Provider, MD  vitamin C (ASCORBIC ACID) 500 MG tablet Take 500 mg by mouth daily.   Yes Historical Provider, MD  warfarin (COUMADIN) 5 MG tablet Take 1 tablet by mouth daily or as directed by coumadin clinic. Patient taking differently: Take 2.5-5 mg by mouth. 2.'5mg'$  on Mondays and Fridays only. Takes '5mg'$  on all other day 06/02/15  Yes Rexene Alberts, MD  ferrous sulfate 325 (65 FE) MG EC tablet Take 1 tablet (325 mg total) by mouth daily with breakfast. Patient not taking: Reported on 06/08/2015 06/02/15   Rexene Alberts, MD    Current Facility-Administered Medications  Medication Dose Route Frequency Provider Last Rate Last Dose  . 0.9 %  sodium chloride infusion   Intravenous Continuous Theressa Millard, MD 75 mL/hr at 06/14/15 2203    . acetaminophen (TYLENOL) tablet 650 mg  650 mg Oral Q6H PRN Theressa Millard, MD       Or  . acetaminophen (TYLENOL) suppository 650 mg  650 mg Rectal Q6H PRN Theressa Millard, MD      . allopurinol (ZYLOPRIM) tablet 300 mg  300 mg Oral Daily Harvette Evonnie Dawes, MD      . alum & mag hydroxide-simeth (MAALOX/MYLANTA) 200-200-20 MG/5ML suspension 30 mL  30 mL Oral Q6H PRN Theressa Millard, MD      . cholecalciferol (VITAMIN D) tablet 2,000 Units  2,000 Units Oral Daily Harvette Evonnie Dawes, MD      . HYDROmorphone (DILAUDID) injection 0.5-1 mg  0.5-1 mg Intravenous Q3H PRN Theressa Millard, MD      . insulin aspart (novoLOG) injection 0-9 Units  0-9 Units Subcutaneous Q6H Samuella Cota, MD      . ipratropium-albuterol (DUONEB) 0.5-2.5 (3) MG/3ML nebulizer solution 3 mL  3 mL Nebulization TID Theressa Millard, MD   3 mL at 06/15/15 0834  . isosorbide mononitrate (IMDUR) 24 hr tablet 30 mg  30 mg Oral Daily Harvette Evonnie Dawes, MD      . omega-3 acid ethyl esters (  LOVAZA) capsule 1 g  1 g Oral BID Theressa Millard, MD      . ondansetron [facility]) tablet 4 mg  4 mg Oral Q6H PRN Theressa Millard, MD       Or  . ondansetron (ZOFRAN) injection 4 mg  4 mg Intravenous Q6H PRN Theressa Millard, MD      . oxyCODONE (Oxy IR/ROXICODONE) immediate release tablet 5 mg  5 mg Oral Q4H PRN Theressa Millard, MD   5 mg at 06/15/15 0145  . pantoprazole (PROTONIX) injection 40 mg  40 mg Intravenous Q12H Theressa Millard, MD   40 mg at 06/14/15 2226  . potassium chloride SA (K-DUR,KLOR-CON) CR tablet 20 mEq  20 mEq Oral Daily Theressa Millard, MD      . rosuvastatin (CRESTOR) tablet 20 mg  20 mg Oral Daily Theressa Millard, MD   20 mg at 06/14/15 2226    Allergies as of 06/14/2015  . (No Known Allergies)    Past Medical History  Diagnosis Date  . Type 2 diabetes mellitus ([place]) 2007  . Essential hypertension   . Gout   . Hypercholesteremia   . Peripheral vascular disease ([place])     Lower extremity PCI/stenting  . COPD (chronic obstructive pulmonary  disease) ([place])   . S/P aortic valve replacement 1990    a. St. Jude  . Chronic back pain   . Dysphagia   . Neuromuscular disorder ([place])   . Peripheral neuropathy ([place])   . Critical lower limb ischemia   . CAD (coronary artery disease)     a. 05/13/14 [name] s/p overlapping DESx2 to SVG to RCA. b.  s/p CABG '90 with redo '94 & stent to RCA SVG in 2005  . Chronic toe ulcer ([place])     a. Left foot  . Chronic systolic heart failure ([place])   . History of kidney stones   . GERD (gastroesophageal reflux disease)   . Arthritis   . Sleep apnea   . Myocardial infarction ([place])   . GI bleeding 05/31/2015    Source not identified.  . Acute blood loss anemia 02/2015 & 05/2015    Hemoglobin 5.5 on 05/31/15; status post transfusion.    Past Surgical History  Procedure Laterality Date  . Aortic valve replacement  1990    St. Jude  . Rotator cuff repair      right  . Cataract extraction      bilateral  . Coronary stent placement  2005    RCA vein graft A 3.0x13.0 TAXUS stent was then placed int he vessel a Viva 3.0x4.0 (perfusion balloon was made ready it was placed through the entire lenght of the stent  . Peripheral vascular procedures lower extremities      right external iliac  artery PTA and stenting as well as bilateral SFA intervention remotely. Repeat procedures in 2011 bilaterally  . Coronary artery bypass graft  1994    6 vessels  . [name]  06/13/2011    Procedure: Venia Minks DILATION;  Surgeon: [name], MD;  Location: [facility];  Service: Endoscopy;  Laterality: N/A;  Dilated to 56.   . Angioplasty illiac artery    . Back surgery  [id number]    2  . Eye surgery    . Amputation Left 07/13/2014    Procedure: Transmetatarsal Amputation;  Surgeon: [name], MD;  Location: [place];  Service: Orthopedics;  Laterality: Left;  . Lower extremity angiogram N/A 02/15/2013  Procedure: LOWER EXTREMITY ANGIOGRAM;  Surgeon: [name], MD;  Location[facility] C[facility] LAB;  Service:  Cardiovascular;  Laterality: N/A;  . Left heart catheterization with coronary angiogram N/A 05/11/2014    Procedure: LEFT HEART CATHETERIZATION WITH CORONARY ANGIOGRAM;  Surgeon: [name], MD;  Location: [facility] CATH LAB;  Service: Cardiovascular;  Laterality: N/A;  . Percutaneous coronary stent intervention (pci-s) N/A 05/13/2014    Procedure: PERCUTANEOUS CORONARY STENT INTERVENTION (PCI-S);  Surgeon: [name], MD;  Location: [facility] CATH LAB;  Service: Cardiovascular;  Laterality: N/A;  . Lower extremity angiogram N/A 06/06/2014    Procedure: LOWER EXTREMITY ANGIOGRAM;  Surgeon: [name], MD;  Location: [facility] CATH LAB;  Service: Cardiovascular;  Laterality: N/A;  . Amputation Left 08/20/2014    Procedure: Revision Transmetatarsal Amputation versus Below Knee Amputation;  Surgeon: [name], MD;  Location: [place];  Service: Orthopedics;  Laterality: Left;  . Stump revision Left 09/23/2014    Procedure: Revision Left Below Knee Amputation;  Surgeon: [name], MD;  Location: [place];  Service: Orthopedics;  Laterality: Left;  . Stump revision Left 10/13/2014    Procedure: REVISION LEFT BELOW KNEE AMPUTATION STUMP;  Surgeon: [name], MD;  Location: [facility];  Service: Orthopedics;  Laterality: Left;  . Esophagogastroduodenoscopy N/A 02/09/2015    DR. Schooler: Normal EGD  . Esophagogastroduodenoscopy  05/2011    Dr. [name]: s/p esophageal dilation, antral erosions/nodularity with benign biopsies  . Colonoscopy  05/2011    Dr. [name]: benign rectal polyp, left sided tics, ascending colonic ulcers (path c/w ischemia)  . Colonoscopy with propofol N/A 06/01/2015    Procedure: COLONOSCOPY WITH PROPOFOL; IN CECUM AT  0921; WITHDRAWAL TIME 8 MINUTES;  Surgeon: [name], MD;  Location: [facility];  Service: Endoscopy;  Laterality: N/A;  . Givens capsule study N/A 06/08/2015    Procedure: GIVENS CAPSULE STUDY;  Surgeon: [name], MD;  Location: [facility];   Service: Endoscopy;  Laterality: N/A;  0700     Family History  Problem Relation Age of Onset  . Colon cancer Neg Hx   . Liver disease Neg Hx     Social History   Social History  . Marital Status: Legally Separated    Spouse Name: N/A  . Number of Children: 5  . Years of Education: N/A   Occupational History  . retired     Stage manager   Social History Main Topics  . Smoking status: Former Smoker -- 1.00 packs/day for 55 years    Types: Cigarettes    Start date: 08/19/1953    Quit date: 05/07/2014  . Smokeless tobacco: Current User    Types: Chew    Last Attempt to Quit: 08/20/1995     Comment: Has quit on 3 occasions. Counseling given today 5-10 minutes   I am more than likely going to quit "  . Alcohol Use: No     Comment: Socially, sometimes 12 ounce beer daily, may go month without/no whiskey  . Drug Use: No  . Sexual Activity: Not Currently   Other Topics Concern  . Not on file   Social History Narrative   Has 3 daughters   Has 2 sons     ROS:  General: Negative for anorexia, weight loss, fever, chills, fatigue, weakness. Eyes: Negative for vision changes.  ENT: Negative for hoarseness, nasal congestion. See history of present illness CV: Negative for chest pain, angina, palpitations, dyspnea on exertion, peripheral edema.  Respiratory:  Negative for dyspnea at rest, dyspnea on exertion, cough, sputum, wheezing.  GI: See history of present illness. GU:  Negative for dysuria, hematuria, urinary incontinence, urinary frequency, nocturnal urination.  MS: Negative for joint pain, low back pain.  Derm: Negative for rash or itching.  Neuro: Negative for weakness, abnormal sensation, seizure, frequent headaches, memory loss, confusion.  Psych: Negative for anxiety, depression, suicidal ideation, hallucinations.  Endo: Negative for unusual weight change.  Heme: Negative for bruising or bleeding. Allergy: Negative for rash or hives.       Physical  Examination: Vital signs in last 24 hours: Temp:  [97.3 F (36.3 C)-98.8 F (37.1 C)] 98.2 F (36.8 C) (10/27 0809) Pulse Rate:  [33-68] 55 (10/27 0400) Resp:  [16-22] 16 (10/27 0400) BP: (88-138)/(39-56) 88/43 mmHg (10/27 0400) SpO2:  [98 %-100 %] 99 % (10/27 0400) Weight:  [200 lb 9.9 oz (91 kg)-206 lb (93.441 kg)] 200 lb 9.9 oz (91 kg) (10/27 0500) Last BM Date: 06/14/15  General: Well-nourished, well-developed in no acute distress.  Head: Normocephalic, atraumatic.   Eyes: Conjunctiva pale, no icterus. Mouth: Oropharyngeal mucosa moist and pink , no lesions erythema or exudate. Neck: Supple without thyromegaly, masses, or lymphadenopathy.  Lungs: Clear to auscultation bilaterally.  Heart: Regular rate and rhythm, no murmurs rubs or gallops.  Abdomen: Bowel sounds are normal, mild tenderness right of the umbilicus. Small umbilical hernia easily reducible and nontender , nondistended, no hepatosplenomegaly or masses, no abdominal bruits , no rebound or guarding.   Rectal: Not performed Extremities: No lower extremity edema on the right. Status post left BKA  Neuro: Alert and oriented x 4 , grossly normal neurologically.  Skin: Warm and dry, no rash or jaundice.   Psych: Alert and cooperative, normal mood and affect.        Intake/Output from previous day: 10/26 0701 - 10/27 0700 In: 756.3 [I.V.:446.3; Blood:310] Out: 600 [Urine:600] Intake/Output this shift:    Lab Results: CBC  Recent Labs  06/14/15 1859 06/14/15 2147 06/15/15 0449  WBC 11.3*  --  9.5  HGB 7.3* 7.1* 7.9*  HCT 24.3* 23.4* 25.0*  MCV 79.9  --  78.1  PLT 366  --  275   BMET  Recent Labs  06/14/15 1859 06/15/15 0449  NA 142 144  K 5.0 4.1  CL 111 113*  CO2 26 24  GLUCOSE 134* 90  BUN 78* 76*  CREATININE 1.59* 1.53*  CALCIUM 9.2 8.8*   LFT  Recent Labs  06/14/15 1859  BILITOT 0.7  ALKPHOS 18*  AST 16  ALT 11*  PROT 5.9*  ALBUMIN 3.2*    Lipase No results for input(s): LIPASE  in the last 72 hours.  PT/INR  Recent Labs  06/14/15 1859 06/15/15 0449  LABPROT 30.5* 30.2*  INR 2.99* 2.95*      Imaging Studies: [place] Art/ven Flow Abd Pelv Doppler  06/06/2015  CLINICAL DATA:  74 year old male with a history of chronic mesenteric ischemia. Status post superior mesenteric artery stent March 06, 2015. He presents today for baseline mesenteric duplex and evaluation of superior mesenteric artery stent. NPO status has been recommended. The patient is not NPO. Cardiovascular risk factors include hypertension, known coronary artery disease, known vascular disease, diabetes, history of tobacco use EXAM: MESENTERIC ARTERIAL DUPLEX TECHNIQUE: Color and duplex Doppler ultrasound was performed to evaluate the mesenteric arteries. COMPARISON:  Angiogram 03/06/2015, CT 03/02/2015, 03/01/2015 FINDINGS: Celiac artery:  Bowel gas obscures visualization Splenic artery:  Bowel gas obscures visualization Hepatic artery:  Bowel gas obscures visualization Proximal velocity 73 centimeter/second Proximal Aorta:  2.7 cm ; 75 centimeter/second velocity Mid aorta:  2.2 cm Distal aorta:  1.9 cm IMPRESSION: Limited mesenteric duplex as the bowel gas limits visualization of the celiac artery and superior mesenteric artery. SMA stent is not evaluated on this study. No evidence of abdominal aortic aneurysm. Signed, Dulcy Fanny. Earleen Newport, DO [facility] Radiology Electronically Signed   By: Corrie Mckusick D.O.   On: 06/06/2015 14:19   Dg Chest Portable 1 View  05/30/2015  CLINICAL DATA:  EKG changes, hypertension, diabetes EXAM: PORTABLE CHEST 1 VIEW COMPARISON:  02/04/2015 FINDINGS: Mild right basilar airspace disease which may reflect scarring versus atelectasis. There is no other focal parenchymal opacity. There is no pleural effusion or pneumothorax. There is stable cardiomegaly. There is evidence of prior CABG. The osseous structures are unremarkable. IMPRESSION: 1.  Mild right basilar airspace disease which may reflect scarring versus atelectasis. Electronically Signed   By: Kathreen Devoid   On: 05/30/2015 12:12  [4 week]   Impression: 74 year old gentleman with multiple medical problems including need for chronic anticoagulation in the setting of aortic prosthetic heart valve, PVD/CAD who presents with recurrent GI bleeding. Suspect upper GI bleeding given the elevated BUN, melena, abnormal stomach appearance on recent capsule endoscopy. Patient will need to have an upper endoscopy in the near future. Currently his INR remains therapeutic. Coumadin and Plavix are on hold. Hemoglobin up appropriately paced 1 unit of blood. There has been no BM since admission.  Cardiology consult is pending per attending regarding use of chronic Plavix in this setting. With last presentation his Coumadin and Plavix were held for endoscopic evaluation, he may need to have some vitamin K for reversal. To discuss further with Dr. Buford Dresser and we will monitor for ongoing active bleeding closely.  Plan: 1. Continue IV Protonix twice a day. 2. Monitor for ongoing bleeding. Continue to hold Plavix and Coumadin for now. 3. Patient needs an upper endoscopy, INR will need to be 1.8 or lower prior to procedure. To discuss utilizing vitamin K with Dr. [name], would appreciate any cardiology input regarding this matter as well. 4. Keep NPO for now.   We would like to thank you for the opportunity to participate in the care of Tanner Pena.  Laureen Ochs. Bernarda Caffey [facility] [phone] 10/27/20169:24 AM     LOS: 1 day

## 2015-06-15 NOTE — Consult Note (Signed)
CARDIOLOGY CONSULT NOTE   Patient ID: Tanner Pena MRN: [id number] DOB/AGE: 74/11/1940 74 y.o.  Admit Date: 06/14/2015 Referring Physician: Nyra Market, MD Primary Physician: [name], MD Consulting Cardiologist: Kate Sable MD Primary Cardiologist [name] Reason for Consultation: Opinion on restarting Plavix.   Clinical Summary Tanner Pena is a 74 y.o.male with known history of CAD with stents to the SVG to RCA in 2015, CABG, mesenteric ischemia s/p stent to proximal SMA in July of 2016, St. Jude mechanical prosthetic Aortic Valve on coumadin, taken off of Plavix due to recurrent GI bleed, with recent admission on 06/01/2015 for GI bleed. Colonoscopy on 06/02/2015 demonstrated diverticulosis, and proceeded to have small bowel capsule study. He was recommended to stop Plavix back in June of 2016       "At this time he is felt to be at moderate risk for proceeding with GI workup given his CAD and PVD but we do feel definitive evaluation is indicated given the underlying comorbidities which require him to be on anticoagulation. Plavix is on hold - he had PCI in 06/2014 but it has been felt that risk of progressive bleeding/anemia at this time outweighs benefit. He has completed >6 months of therapy and is not having any active angina"-Dr. Marlou Porch.   Apparently he was restarted on Plavix after the patients daughter contacted Dr. [name] to inquire whether he needed to restart the medication due to multiple stents. There is no phone note recorded about restarting Plavix, only that the patient needed to follow up with Dr. [name].   He was admitted with recurrent GI bleed after starting back on Plavix-patient states that his doctor told him to take it. On arrival Hgb 7.3/Hct 24.3. He complained of melena and abdominal pain. Also chest discomfort and some dyspnea. BP 129/39, HR 37, O2 Sat 100%. BUN 78, Creatinine 1.59. LFTs are low. INR 2.99.  EKG demonstrated SR with Bigeminy and  lateral T-wave inversion.    No Known Allergies  Medications Scheduled Medications: . sodium chloride   Intravenous Once  . allopurinol  300 mg Oral Daily  . cholecalciferol  2,000 Units Oral Daily  . insulin aspart  0-9 Units Subcutaneous Q6H  . ipratropium-albuterol  3 mL Nebulization TID  . isosorbide mononitrate  30 mg Oral Daily  . omega-3 acid ethyl esters  1 g Oral BID  . pantoprazole (PROTONIX) IV  40 mg Intravenous Q12H  . potassium chloride SA  20 mEq Oral Daily  . rosuvastatin  20 mg Oral Daily    Infusions: . sodium chloride 75 mL/hr at 06/14/15 2203    PRN Medications: acetaminophen **OR** acetaminophen, alum & mag hydroxide-simeth, HYDROmorphone (DILAUDID) injection, ondansetron **OR** ondansetron (ZOFRAN) IV, oxyCODONE   Past Medical History  Diagnosis Date  . Type 2 diabetes mellitus ([place]) 2007  . Essential hypertension   . Gout   . Hypercholesteremia   . Peripheral vascular disease ([place])     Lower extremity PCI/stenting  . COPD (chronic obstructive pulmonary disease) ([place])   . S/P aortic valve replacement 1990    a. St. Jude  . Chronic back pain   . Dysphagia   . Neuromuscular disorder ([place])   . Peripheral neuropathy ([place])   . Critical lower limb ischemia   . CAD (coronary artery disease)     a. 05/13/14 [name] s/p overlapping DESx2 to SVG to RCA. b.  s/p CABG '90 with redo '94 & stent to RCA SVG in 2005  . Chronic toe ulcer ([place])  a. Left foot  . Chronic systolic heart failure ([place])   . History of kidney stones   . GERD (gastroesophageal reflux disease)   . Arthritis   . Sleep apnea   . Myocardial infarction ([place])   . GI bleeding 05/31/2015    Source not identified.  . Acute blood loss anemia 02/2015 & 05/2015    Hemoglobin 5.5 on 05/31/15; status post transfusion.    Past Surgical History  Procedure Laterality Date  . Aortic valve replacement  1990    St. Jude  . Rotator cuff repair      right  . Cataract extraction      bilateral  .  Coronary stent placement  2005    RCA vein graft A 3.0x13.0 TAXUS stent was then placed int he vessel a Viva 3.0x4.0 (perfusion balloon was made ready it was placed through the entire lenght of the stent  . Peripheral vascular procedures lower extremities      right external iliac  artery PTA and stenting as well as bilateral SFA intervention remotely. Repeat procedures in 2011 bilaterally  . Coronary artery bypass graft  1994    6 vessels  . [name]  06/13/2011    Procedure: Venia Minks DILATION;  Surgeon: [name], MD;  Location: [facility];  Service: Endoscopy;  Laterality: N/A;  Dilated to 56.   . Angioplasty illiac artery    . Back surgery  [id number]    2  . Eye surgery    . Amputation Left 07/13/2014    Procedure: Transmetatarsal Amputation;  Surgeon: [name], MD;  Location: [facility];  Service: Orthopedics;  Laterality: Left;  . Lower extremity angiogram N/A 02/15/2013    Procedure: LOWER EXTREMITY ANGIOGRAM;  Surgeon: [name], MD;  Location: [facility] CATH LAB;  Service: Cardiovascular;  Laterality: N/A;  . Left heart catheterization with coronary angiogram N/A 05/11/2014    Procedure: LEFT HEART CATHETERIZATION WITH CORONARY ANGIOGRAM;  Surgeon: [name], MD;  Location: [facility] CATH LAB;  Service: Cardiovascular;  Laterality: N/A;  . Percutaneous coronary stent intervention (pci-s) N/A 05/13/2014    Procedure: PERCUTANEOUS CORONARY STENT INTERVENTION (PCI-S);  Surgeon: [name], MD;  Location: [facility] CATH LAB;  Service: Cardiovascular;  Laterality: N/A;  . Lower extremity angiogram N/A 06/06/2014    Procedure: LOWER EXTREMITY ANGIOGRAM;  Surgeon: [name], MD;  Location: [facility] CATH LAB;  Service: Cardiovascular;  Laterality: N/A;  . Amputation Left 08/20/2014    Procedure: Revision Transmetatarsal Amputation versus Below Knee Amputation;  Surgeon: [name], MD;  Location: [place];  Service: Orthopedics;  Laterality: Left;  . Stump revision Left 09/23/2014      Procedure: Revision Left Below Knee Amputation;  Surgeon: [name], MD;  Location: [place];  Service: Orthopedics;  Laterality: Left;  . Stump revision Left 10/13/2014    Procedure: REVISION LEFT BELOW KNEE AMPUTATION STUMP;  Surgeon: [name], MD;  Location: [facility];  Service: Orthopedics;  Laterality: Left;  . Esophagogastroduodenoscopy N/A 02/09/2015    DR. Schooler: Normal EGD  . Esophagogastroduodenoscopy  05/2011    Dr. [name]: s/p esophageal dilation, antral erosions/nodularity with benign biopsies  . Colonoscopy  05/2011    Dr. [name]: benign rectal polyp, left sided tics, ascending colonic ulcers (path c/w ischemia)  . Colonoscopy with propofol N/A 06/01/2015    Procedure: COLONOSCOPY WITH PROPOFOL; IN CECUM AT  0921; WITHDRAWAL TIME 8 MINUTES;  Surgeon: [name], MD;  Location: [facility];  Service: Endoscopy;  Laterality: N/A;  . Givens capsule study N/A 06/08/2015    Procedure: GIVENS CAPSULE STUDY;  Surgeon: [name], MD;  Location: [facility];  Service: Endoscopy;  Laterality: N/A;  0700     Family History  Problem Relation Age of Onset  . Colon cancer Neg Hx   . Liver disease Neg Hx     Social History Tanner Pena reports that he quit smoking about 13 months ago. His smoking use included Cigarettes. He started smoking about 61 years ago. He has a 55 pack-year smoking history. His smokeless tobacco use includes Chew. Tanner Pena reports that he does not drink alcohol.  Review of Systems Complete review of systems are found to be negative unless outlined in H&P above.  Physical Examination Blood pressure 131/48, pulse 59, temperature 98.2 F (36.8 C), temperature source Oral, resp. rate 17, height '5\' 10"'$  (1.778 m), weight 200 lb 9.9 oz (91 kg), SpO2 99 %.  Intake/Output Summary (Last 24 hours) at 06/15/15 1116 Last data filed at 06/15/15 1023  Gross per 24 hour  Intake 756.25 ml  Output    825 ml  Net -68.75 ml    Telemetry: Sinus  bradycardia.   GEN: No acute distress.  HEENT: Conjunctiva and lids normal, oropharynx clear with moist mucosa. Neck: Supple, no elevated JVP or carotid bruits, no thyromegaly. Lungs: Clear to auscultation, nonlabored breathing at rest. Cardiac: Regular rate and rhythm,occasional extra systole. 1/6  significant systolic murmur, no pericardial rub. Abdomen: Soft, nontender, no hepatomegaly, bowel sounds present, no guarding or rebound. Extremities: No pitting edema, distal pulses 2+.Left AKA.  Skin: Warm and dry. Musculoskeletal: No kyphosis. Neuropsychiatric: Alert and oriented x3, affect grossly appropriate.  Prior Cardiac Testing/Procedures 1.Echocardiogram 12/29/2014 Left ventricle: Inferior, septal and apical hypokinesis. The cavity size was mildly dilated. Wall thickness was increased in a pattern of moderate LVH. The estimated ejection fraction was 40%. - Aortic valve: Mechanical AVR with no periporsthetic regurgitation. Gradients elevated but actually lower than 2015 when peak velocity was 3.5 m/sec and peak gradient was 49 mmHg. Valve area (VTI): 0.88 cm^2. Valve area (Vmax): 0.63 cm^2. Valve area (Vmean): 0.94 cm^2. - Left atrium: The atrium was moderately dilated. - Atrial septum: There was increased thickness of the septum, consistent with lipomatous hypertrophy.   Lab Results  Basic Metabolic Panel:  Recent Labs Lab 06/14/15 1859 06/15/15 0449  NA 142 144  K 5.0 4.1  CL 111 113*  CO2 26 24  GLUCOSE 134* 90  BUN 78* 76*  CREATININE 1.59* 1.53*  CALCIUM 9.2 8.8*    Liver Function Tests:  Recent Labs Lab 06/14/15 1859  AST 16  ALT 11*  ALKPHOS 18*  BILITOT 0.7  PROT 5.9*  ALBUMIN 3.2*    CBC:  Recent Labs Lab 06/14/15 1859 06/14/15 2147 06/15/15 0449  WBC 11.3*  --  9.5  NEUTROABS 7.4  --   --   HGB 7.3* 7.1* 7.9*  HCT 24.3* 23.4* 25.0*  MCV 79.9  --  78.1  PLT 366  --  275    Cardiac Enzymes:  Recent Labs Lab  06/14/15 1859  TROPONINI 0.03    ECG: Sinus bradycardia with bigeminy and lateral T-wave abnormalities.    Impression and Recommendations  1.CAD: S/P CABG: Stents to the SVG and to SMA in June and July of 2016. He had been taken off of Plavix in June due to GI bleed. Started taking it again with recent admission for GI bleed in  early October. Was told to stop Plavix again and not restart. Apparently restarted Plavix after daughter spoke to cardiology office-Dr. [facility] nurse. Patient again came back in with GI bleed.   He is of severe risk for recurrent GI bleed by frequent admissions for same when restarting Plavix. He is NOT to take Plavix again or risk severe bleed and worst case scenario death. This has been reinforced with the patient, his daughter is not at bedside. Currently.   2. Mechanical St. Jude Aortic Valve; He is currently off coumadin with INR trending downward. INR this am 2.95. No need for Vitamin K at this time. See Dr. Court Joy note below for detailed explanation.   3. GI Bleed: Abnormal capsule study. Defer to GI for planned testing and procedures.   Signed: Phill Myron. Lawrence NP [place]  06/15/2015, 11:16 AM Co-Sign MD  The patient was seen and examined, and I agree with the assessment and plan as documented above, with modifications as noted below. Pt with complex cardiovascular history and aforementioned presentation admitted with GI bleed. Denies chest pain. Patient instructed to stop Plavix altogether given risk for life-threatening GI bleed. This was emphasized numerous times by me to the patient (daughter not present). Last coronary intervention was on 05/13/2014 with overlapping DES to the SVG to RCA.  Warfarin currently on hold, would allow to drift down rather than administer vitamin K. As the prosthetic valve is in the aortic position and he does not have an EF<30%, atrial fibrillation, or hypercoagulable disorder, bridging anticoagulation therapy is not  required. Would try to minimize time in the subtherapeutic range, however.   Kate Sable, MD, [facility]  06/15/2015 11:52 AM

## 2015-06-15 NOTE — Consult Note (Signed)
   [facility] CM Inpatient Consult   06/15/2015  JAKOB KIMBERLIN Jul 20, 1941 [id number]  Met with the patient to follow up on his progress for discharge and patient continues to confirm his desire to continue [place] Management services of which he was reminded that he will receive follow up telephone calls and assessed for home visits.  Reminded that the services does not interfere with or replace any services set up by the inpatient care management team.   For questions, please contact: Royetta Crochet. Laymond Purser, RN, BSN, [facility] Liaison [phone]

## 2015-06-16 DIAGNOSIS — K922 Gastrointestinal hemorrhage, unspecified: Secondary | ICD-10-CM

## 2015-06-16 DIAGNOSIS — R1031 Right lower quadrant pain: Secondary | ICD-10-CM

## 2015-06-16 LAB — BASIC METABOLIC PANEL
ANION GAP: 6 (ref 5–15)
BUN: 43 mg/dL — AB (ref 6–20)
CHLORIDE: 113 mmol/L — AB (ref 101–111)
CO2: 22 mmol/L (ref 22–32)
Calcium: 8.8 mg/dL — ABNORMAL LOW (ref 8.9–10.3)
Creatinine, Ser: 1.23 mg/dL (ref 0.61–1.24)
GFR calc Af Amer: >60 mL/min (ref >60)
GFR, EST NON AFRICAN AMERICAN: 56 mL/min — AB (ref >60)
GLUCOSE: 106 mg/dL — AB (ref 65–99)
POTASSIUM: 4.4 mmol/L (ref 3.5–5.1)
Sodium: 141 mmol/L (ref 135–145)

## 2015-06-16 LAB — GLUCOSE, CAPILLARY
GLUCOSE-CAPILLARY: 130 mg/dL — AB (ref 65–99)
GLUCOSE-CAPILLARY: 144 mg/dL — AB (ref 65–99)
Glucose-Capillary: 111 mg/dL — ABNORMAL HIGH (ref 65–99)
Glucose-Capillary: 169 mg/dL — ABNORMAL HIGH (ref 65–99)

## 2015-06-16 LAB — CBC
HEMATOCRIT: 27.1 % — AB (ref 39.0–52.0)
HEMOGLOBIN: 8.4 g/dL — AB (ref 13.0–17.0)
MCH: 24.6 pg — ABNORMAL LOW (ref 26.0–34.0)
MCHC: 31 g/dL (ref 30.0–36.0)
MCV: 79.5 fL (ref 78.0–100.0)
Platelets: 260 10*3/uL (ref 150–400)
RBC: 3.41 MIL/uL — ABNORMAL LOW (ref 4.22–5.81)
RDW: 19.1 % — AB (ref 11.5–15.5)
WBC: 7.3 10*3/uL (ref 4.0–10.5)

## 2015-06-16 LAB — PROTIME-INR
INR: 3.72 — AB (ref 0.00–1.49)
INR: 3.25 — AB (ref 0.00–1.49)
PROTHROMBIN TIME: 32.5 s — AB (ref 11.6–15.2)
Prothrombin Time: 36 seconds — ABNORMAL HIGH (ref 11.6–15.2)

## 2015-06-16 MED ORDER — INSULIN ASPART 100 UNIT/ML ~~LOC~~ SOLN
0.0000 [IU] | Freq: Three times a day (TID) | SUBCUTANEOUS | Status: DC
  Administered 2015-06-16: 2 [IU] via SUBCUTANEOUS
  Administered 2015-06-16 – 2015-06-17 (×3): 1 [IU] via SUBCUTANEOUS
  Administered 2015-06-17: 2 [IU] via SUBCUTANEOUS
  Administered 2015-06-18 (×3): 1 [IU] via SUBCUTANEOUS
  Administered 2015-06-19 (×2): 2 [IU] via SUBCUTANEOUS
  Administered 2015-06-20: 1 [IU] via SUBCUTANEOUS
  Administered 2015-06-20 – 2015-06-21 (×2): 2 [IU] via SUBCUTANEOUS

## 2015-06-16 MED ORDER — IPRATROPIUM-ALBUTEROL 0.5-2.5 (3) MG/3ML IN SOLN: 3.0000 mL | Freq: Four times a day (QID) | RESPIRATORY_TRACT | Status: DC | PRN

## 2015-06-16 MED ORDER — OXYCODONE HCL 5 MG PO TABS
5.0000 mg | ORAL_TABLET | ORAL | Status: DC | PRN
Start: 2015-06-16 — End: 2015-06-23
  Administered 2015-06-16 – 2015-06-20 (×5): 5 mg via ORAL
  Filled 2015-06-16 (×6): qty 1

## 2015-06-16 MED ORDER — OXYCODONE-ACETAMINOPHEN 5-325 MG PO TABS
1.0000 | ORAL_TABLET | ORAL | Status: DC | PRN
  Administered 2015-06-16 – 2015-06-20 (×5): 1 via ORAL
  Filled 2015-06-16 (×6): qty 1

## 2015-06-16 NOTE — Progress Notes (Signed)
This encounter was created in error - please disregard.

## 2015-06-16 NOTE — Care Management Note (Signed)
Case Management Note  Patient Details  Name: Tanner Pena MRN: [id number] Date of Birth: [birth year]  Subjective/Objective:                  Pt from home, lives alone with strong family support. Pt has no DME needs prior to admission. Pt uses wheelchair for mobility. Pt is active with [facility]. Pt receives wound care at OP center in [place].   Action/Plan: Pt plans to return home with self care. No CM needs identified at this time.  Expected Discharge Date:  06/17/15               Expected Discharge Plan:  Home/Self Care  In-House Referral:  NA  Discharge planning Services  CM Consult  Post Acute Care Choice:  NA Choice offered to:  NA  DME Arranged:    DME Agency:     HH Arranged:    HH Agency:     Status of Service:  In process, will continue to follow  Medicare Important Message Given:    Date Medicare IM Given:    Medicare IM give by:    Date Additional Medicare IM Given:    Additional Medicare Important Message give by:     If discussed at [place] of Stay Meetings, dates discussed:    Additional Comments:  Sherald Barge, RN 06/16/2015, 1:11 PM

## 2015-06-16 NOTE — Progress Notes (Signed)
Subjective: Feeling a bit better today. Abdominal pain improved, denies N/V. No bowel mvoement in 2 days, no signs/symptoms of overt GI bleed. No other upper or lower GI symptoms. S/P 2 U PRBC.  Objective: Vital signs in last 24 hours: Temp:  [97.1 F (36.2 C)-97.9 F (36.6 C)] 97.1 F (36.2 C) (10/28 0746) Pulse Rate:  [27-116] 32 (10/28 0800) Resp:  [14-25] 14 (10/28 0800) BP: (100-149)/(32-94) 113/47 mmHg (10/28 0700) SpO2:  [95 %-100 %] 99 % (10/28 0822) Weight:  [200 lb 6.4 oz (90.9 kg)] 200 lb 6.4 oz (90.9 kg) (10/28 0500) Last BM Date: 06/14/15 General:   Alert and oriented, pleasant Head:  Normocephalic and atraumatic. Eyes:  No icterus, sclera clear. Conjuctiva pink.  Heart:  S1, S2 present, systolic murmur noted.  Lungs: Clear to auscultation bilaterally, without wheezing, rales, or rhonchi.  Abdomen:  Bowel sounds present, soft, non-tender, non-distended. No HSM or hernias noted. No rebound or guarding. No masses appreciated  Neurologic:  Alert and  oriented x4;  grossly normal neurologically. Skin:  Warm and dry, intact without significant lesions.  Psych:  Alert and cooperative. Normal mood and affect.  Intake/Output from previous day: 10/27 0701 - 10/28 0700 In: 2706 [P.O.:360; I.V.:732.5; Blood:302.5] Out: 2175 [Urine:2175] Intake/Output this shift: Total I/O In: 360 [P.O.:360] Out: 300 [Urine:300]  Lab Results:  Recent Labs  06/14/15 1859 06/14/15 2147 06/15/15 0449 06/16/15 0449  WBC 11.3*  --  9.5 7.3  HGB 7.3* 7.1* 7.9* 8.4*  HCT 24.3* 23.4* 25.0* 27.1*  PLT 366  --  275 260   BMET  Recent Labs  06/14/15 1859 06/15/15 0449 06/16/15 0449  NA 142 144 141  K 5.0 4.1 4.4  CL 111 113* 113*  CO2 '26 24 22  '$ GLUCOSE 134* 90 106*  BUN 78* 76* 43*  CREATININE 1.59* 1.53* 1.23  CALCIUM 9.2 8.8* 8.8*   LFT  Recent Labs  06/14/15 1859  PROT 5.9*  ALBUMIN 3.2*  AST 16  ALT 11*  ALKPHOS 18*  BILITOT 0.7   PT/INR  Recent Labs   06/15/15 0449 06/16/15 0449  LABPROT 30.2* 36.0*  INR 2.95* 3.72*   Hepatitis Panel No results for input(s): HEPBSAG, HCVAB, HEPAIGM, HEPBIGM in the last 72 hours.   Studies/Results: Dg Chest Port 1 View  06/15/2015  CLINICAL DATA:  Abdominal pain.  Admitted for recurrent GI bleed. EXAM: PORTABLE CHEST 1 VIEW COMPARISON:  Radiograph 05/30/2015 FINDINGS: Sternotomy wires overlie stable enlarged cardiac silhouette. Chronic elevation LEFT hemidiaphragm. No effusion infiltrate pneumothorax. IMPRESSION: No acute cardiopulmonary findings. Cardiomegaly and elevation of LEFT hemidiaphragm Electronically Signed   By: Suzy Bouchard M.D.   On: 06/15/2015 16:05   Dg Abd Portable 1v  06/15/2015  CLINICAL DATA:  Abdominal pain.  Recurrent GI bleeding. EXAM: PORTABLE ABDOMEN - 1 VIEW COMPARISON:  None. FINDINGS: The bowel gas pattern is normal. Extensive aortic and visceral arterial vascular calcification noted. IMPRESSION: No acute findings. Electronically Signed   By: Earle Gell M.D.   On: 06/15/2015 16:10    Assessment: 74 year old gentleman with multiple medical problems including need for chronic anticoagulation in the setting of aortic prosthetic heart valve, PVD/CAD who presents with recurrent GI bleeding. Suspect upper GI bleeding given the elevated BUN, melena, abnormal stomach appearance on recent capsule endoscopy. Patient will need to have an upper endoscopy in the near future. Coumadin and Plavix are on hold. Hemoglobin up appropriately paced 2 unit of blood. There has been no BM since admission.  H/H today is 8.4/27.1. INR increased today from 2.95 to 3.72 with coumadin on hold, query lab error, repeat INR already ordered. BUN improved, Cr normal. No BM since admission, no obvious signs of active GI bleeding. Continue to plan for EGD when INR <2.0, preferably near 1.8.  Plan: 1. Continue supportive measures 2. Monitor INR 3. Monitor closely for any GI bleed or decrease in  H/H. 4. Transfuse as necessary 5. Continue to hold coumadin and Plavix 6. Continue clear liquid diet, re-evaluate INR in the morning and if INR acceptable for EGD can make NPO minimum 2 hours prior to procedure.   Walden Field, AGNP-C Adult & Gerontological Nurse Practitioner [facility]    LOS: 2 days    06/16/2015, 9:41 AM

## 2015-06-16 NOTE — Consult Note (Signed)
Consulting cardiologist: Kate Sable MD Primary Cardiologist: [name] MD  Cardiology Specific Problem List: 1. CAD 2. PAD 3. St, Jude Mechanical valve on coumadin.  Subjective:    Complained of phantom leg pain on the left.   Objective:   Temp:  [97.1 F (36.2 C)-97.9 F (36.6 C)] 97.1 F (36.2 C) (10/28 0746) Pulse Rate:  [27-116] 32 (10/28 0800) Resp:  [14-25] 14 (10/28 0800) BP: (100-149)/(32-94) 113/47 mmHg (10/28 0700) SpO2:  [95 %-100 %] 99 % (10/28 0822) Weight:  [200 lb 6.4 oz (90.9 kg)] 200 lb 6.4 oz (90.9 kg) (10/28 0500) Last BM Date: 06/14/15  Filed Weights   06/14/15 2151 06/15/15 0500 06/16/15 0500  Weight: 201 lb 8 oz (91.4 kg) 200 lb 9.9 oz (91 kg) 200 lb 6.4 oz (90.9 kg)    Intake/Output Summary (Last 24 hours) at 06/16/15 0932 Last data filed at 06/16/15 0803  Gross per 24 hour  Intake   1755 ml  Output   2475 ml  Net   -720 ml    Telemetry:Bradycardia with 1st degree block and frequent PVC's.   Exam:  General: No acute distress.  HEENT: Conjunctiva and lids normal, oropharynx clear.  Lungs: Bilateral wheezes.  Cardiac: No elevated JVP or bruits. RRR with mechanical click noted., no gallop or rub.   Abdomen: Normoactive bowel sounds, nontender, nondistended.  Extremities: No pitting edema, distal pulses full.  Neuropsychiatric: Alert and oriented x3, affect appropriate.   Lab Results:  Basic Metabolic Panel:  Recent Labs Lab 06/14/15 1859 06/15/15 0449 06/16/15 0449  NA 142 144 141  K 5.0 4.1 4.4  CL 111 113* 113*  CO2 '26 24 22  '$ GLUCOSE 134* 90 106*  BUN 78* 76* 43*  CREATININE 1.59* 1.53* 1.23  CALCIUM 9.2 8.8* 8.8*    Liver Function Tests:  Recent Labs Lab 06/14/15 1859  AST 16  ALT 11*  ALKPHOS 18*  BILITOT 0.7  PROT 5.9*  ALBUMIN 3.2*    CBC:  Recent Labs Lab 06/14/15 1859 06/14/15 2147 06/15/15 0449 06/16/15 0449  WBC 11.3*  --  9.5 7.3  HGB 7.3* 7.1* 7.9* 8.4*  HCT 24.3* 23.4*  25.0* 27.1*  MCV 79.9  --  78.1 79.5  PLT 366  --  275 260    Cardiac Enzymes:  Recent Labs Lab 06/14/15 1859  TROPONINI 0.03   Coagulation:  Recent Labs Lab 06/14/15 1859 06/15/15 0449 06/16/15 0449  INR 2.99* 2.95* 3.72*    Radiology: Dg Chest Port 1 View  06/15/2015  CLINICAL DATA:  Abdominal pain.  Admitted for recurrent GI bleed. EXAM: PORTABLE CHEST 1 VIEW COMPARISON:  Radiograph 05/30/2015 FINDINGS: Sternotomy wires overlie stable enlarged cardiac silhouette. Chronic elevation LEFT hemidiaphragm. No effusion infiltrate pneumothorax. IMPRESSION: No acute cardiopulmonary findings. Cardiomegaly and elevation of LEFT hemidiaphragm Electronically Signed   By: Suzy Bouchard M.D.   On: 06/15/2015 16:05   Dg Abd Portable 1v  06/15/2015  CLINICAL DATA:  Abdominal pain.  Recurrent GI bleeding. EXAM: PORTABLE ABDOMEN - 1 VIEW COMPARISON:  None. FINDINGS: The bowel gas pattern is normal. Extensive aortic and visceral arterial vascular calcification noted. IMPRESSION: No acute findings. Electronically Signed   By: Earle Gell M.D.   On: 06/15/2015 16:10    Medications:   Scheduled Medications: . sodium chloride   Intravenous Once  . allopurinol  300 mg Oral Daily  . cholecalciferol  2,000 Units Oral Daily  . insulin aspart  0-9 Units Subcutaneous TID WC  .  ipratropium-albuterol  3 mL Nebulization TID  . isosorbide mononitrate  30 mg Oral Daily  . omega-3 acid ethyl esters  1 g Oral BID  . pantoprazole (PROTONIX) IV  40 mg Intravenous Q12H  . potassium chloride SA  20 mEq Oral Daily  . rosuvastatin  20 mg Oral Daily    Infusions:    PRN Medications: acetaminophen **OR** acetaminophen, alum & mag hydroxide-simeth, ondansetron **OR** ondansetron (ZOFRAN) IV, oxyCODONE   Assessment and Plan:   1. CAD: Hx of CABG: Stents to SVG and to SMA in June and July of 2016. Reinforced need to stop Plavix and never use again. He verbalizes understanding. Continue statin. No ASA at  this time.   2. St. Jude AoV: Coumadin stopped. INR actually higher this am. Will recheck. No vitamin K at this time until lab is rechecked.   3.  GI bleed: Plans for EDG over the weekend if INR allows.       Phill Myron. Lawrence NP [place]  06/16/2015, 9:32 AM   The patient was seen and examined, and I agree with the assessment and plan as documented above, with modifications as noted below. INR 3.25 on recheck. 2.95 yesterday. I would still favor allowing INR to drift down if possible to minimize time in the subtherapeutic range.  Patient instructed to stop Plavix altogether given risk for life-threatening GI bleed. This was emphasized numerous times by me to the patient (daughter not present). Last coronary intervention was on 05/13/2014 with overlapping DES to the SVG to RCA.  As the prosthetic valve is in the aortic position and he does not have an EF<30%, atrial fibrillation, or hypercoagulable disorder, bridging anticoagulation therapy is not required.  Awaiting EGD.  Kate Sable, MD, [facility]  06/16/2015 12:05 PM

## 2015-06-16 NOTE — Care Management Important Message (Signed)
Important Message  Patient Details  Name: Tanner Pena MRN: [id number] Date of Birth: 04-Feb-1941   Medicare Important Message Given:  Yes-second notification given    Sherald Barge, RN 06/16/2015, 1:14 PM

## 2015-06-16 NOTE — Progress Notes (Signed)
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 Patient had a recent hospitalization(10/11-10/14/2016) for GI bleeding and underwent a GI workup. He had to be transfused 3 units due to a hemoglobin level of 5.5. Upon discharge on 06/02/2015 his hemoglobin was 8.1. He had an outpatient capsule endoscopy which revealed an ulcer in the small bowel however he reported he has been compliant with his Plavix and Coumadine due to his hx of cardiac stents and mechanical AVR.  While in the ED, his initial hgb was 7.3 and INR was 2.99.  He was transfused 1U and placed on IV Protonix. He was admitted for further management.     Assessment/Plan: 1. GI bleed, suspect upper with elevated BUN and recent capsule study results showing gastric ulcer and erosions. Complicated by warfarin and Plavix. No evidence of active bleeding. Hgb 8.4 s/p transfusion of 2U PRBCs. GI recommends upper endoscopy once INR is below 1.8. Cardiology following and agrees with discontinuing the patient's Plavix indefinitely.  2. ABLA. Transfuse as needed and monitor. Hgb 8.4 today s/p 2U PRBCs.  3. 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 Warfarin on hold, no need for Heparin bridge per cardiology.  4. CAD with multiple stents 5. PVD s/p SMA stent 02/2015, last seen by Dr. Earleen Newport 10/18, per documentation recommended against antiplatelet agent. 6. AKI, resolved.  7. DM type 2, stable. Continue SSI. 8. HLD, continue statin. 9. COPD, stable without evidence of exacerbation.  10. Chronic systolic heart failure, appears compensated. 11. Essential HTN, continue home meds and monitor. Appears stable.    Overall improved.    Continue PPI. CBC in AM  Daily INR, no need for Heparin bridge. Will d/w cardiology.  Move to medical floor today.   Code Status:  Full DVT prophylaxis: SCDs Family Communication: Discussed with patient who understands and has no concerns at this time. Disposition Plan: Move to medical bed today.   [name], MD  [facility]  Pager [phone] If 7PM-7AM, please contact night-coverage at [email or web address], password [facility] 06/16/2015, 6:37 AM  LOS: 2 days   Consultants:  GI  Procedures:  Transfused 2U PRBCs  Antibiotics:    HPI/Subjective: Feels better. Denies any CP, abdominal pain, nausea, or vomiting.   Objective: Filed Vitals:   06/16/15 0300 06/16/15 0400 06/16/15 0500 06/16/15 0600  BP: 137/55 130/53 149/50 127/50  Pulse: 56 27 41 53  Temp:  97.3 F (36.3 C)    TempSrc:  Oral    Resp: '16 24 18 25  '$ Height:      Weight:   90.9 kg (200 lb 6.4 oz)   SpO2: 98% 98% 100% 98%    Intake/Output Summary (Last 24 hours) at 06/16/15 0637 Last data filed at 06/16/15 0600  Gross per 24 hour  Intake   1620 ml  Output   2175 ml  Net   -555 ml     Filed Weights   06/14/15 2151 06/15/15 0500 06/16/15 0500  Weight: 91.4 kg (201 lb 8 oz) 91 kg (200 lb 9.9 oz) 90.9 kg (200 lb 6.4 oz)    Exam:     VSS, afebrile, not hypoxic General:  Appears calm and comfortable Cardiovascular: RRR, click 2/6 systolic murmur with a click, No r/g. Left BKA, no RLE edema Telemetry: SR with PVCs Respiratory: CTA bilaterally, no w/r/r. Normal respiratory effort. Abdomen: soft, ntnd.  Musculoskeletal: RLE appears normal Psychiatric: grossly normal mood and affect, speech fluent and appropriate Neurologic: grossly non-focal.  New data reviewed:  Hgb 8.4 s/p 2U PRBCs, platelets stable at 260. CBC otherwise unremarkable  Creatinine wnl 1.23, BUN 43  INR 3.72.   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 sodium chloride   Intravenous Once  . allopurinol  300 mg Oral Daily  . cholecalciferol   2,000 Units Oral Daily  . insulin aspart  0-9 Units Subcutaneous Q6H  . ipratropium-albuterol  3 mL Nebulization TID  . isosorbide mononitrate  30 mg Oral Daily  . omega-3 acid ethyl esters  1 g Oral BID  . pantoprazole (PROTONIX) IV  40 mg Intravenous Q12H  . potassium chloride SA  20 mEq Oral Daily  . rosuvastatin  20 mg Oral Daily   Continuous Infusions: . sodium chloride 75 mL/hr at 06/15/15 2054    Principal Problem:   GI bleed Active Problems:   H/O aortic valve replacement- [employer]   PVD prior SFA PTA with chronic LE disease, not amenable to PTA   DM2 (diabetes mellitus, type 2) (HCC)   Hyperlipidemia with target LDL less than 70   COPD (chronic obstructive pulmonary disease) (HCC)   Chronic anticoagulation   Acute blood loss anemia   Chronic systolic heart failure (HCC)   Coronary artery disease involving native coronary artery of native heart with angina pectoris [facility])   Essential hypertension   History of coronary artery stent placement   Anticoagulation management encounter   Time spent 20 minutes  By signing my name below, I, Rosalie Doctor attest that this documentation has been prepared under the direction and in the presence of [name], MD Electronically signed: Rosalie Doctor, Scribe. 06/16/2015 9:26am  I personally performed the services described in this documentation. All medical record entries made by the scribe were at my direction. I have reviewed the chart and agree that the record reflects my personal performance and is accurate and complete. [name], MD

## 2015-06-17 LAB — GLUCOSE, CAPILLARY
GLUCOSE-CAPILLARY: 131 mg/dL — AB (ref 65–99)
Glucose-Capillary: 121 mg/dL — ABNORMAL HIGH (ref 65–99)
Glucose-Capillary: 135 mg/dL — ABNORMAL HIGH (ref 65–99)
Glucose-Capillary: 151 mg/dL — ABNORMAL HIGH (ref 65–99)

## 2015-06-17 LAB — PROTIME-INR
INR: 3.68 — ABNORMAL HIGH (ref 0.00–1.49)
Prothrombin Time: 35.7 seconds — ABNORMAL HIGH (ref 11.6–15.2)

## 2015-06-17 NOTE — Progress Notes (Signed)
Patient without complaints No obvious bleeding.  Vital signs in last 24 hours: Temp:  [97.8 F (36.6 C)-98.1 F (36.7 C)] 98.1 F (36.7 C) (10/29 0603) Pulse Rate:  [44-57] 57 (10/29 0603) Resp:  [20] 20 (10/29 0603) BP: (139-147)/(55-60) 139/55 mmHg (10/29 0603) SpO2:  [97 %-100 %] 97 % (10/29 0603) Last BM Date: 06/14/15 General:   Alert,  pleasant and cooperative in NAD Abdomen:  Nondistended.  Normal bowel sounds, without guarding, and without rebound.  No mass or organomegaly. Extremities:  Without clubbing or edema.    Intake/Output from previous day: 10/28 0701 - 10/29 0700 In: 1065 [P.O.:1065] Out: 1200 [Urine:1200] Intake/Output this shift: Total I/O In: 240 [P.O.:240] Out: 250 [Urine:250]  Lab Results:  Recent Labs  06/14/15 1859 06/14/15 2147 06/15/15 0449 06/16/15 0449  WBC 11.3*  --  9.5 7.3  HGB 7.3* 7.1* 7.9* 8.4*  HCT 24.3* 23.4* 25.0* 27.1*  PLT 366  --  275 260   BMET  Recent Labs  06/14/15 1859 06/15/15 0449 06/16/15 0449  NA 142 144 141  K 5.0 4.1 4.4  CL 111 113* 113*  CO2 '26 24 22  '$ GLUCOSE 134* 90 106*  BUN 78* 76* 43*  CREATININE 1.59* 1.53* 1.23  CALCIUM 9.2 8.8* 8.8*   LFT  Recent Labs  06/14/15 1859  PROT 5.9*  ALBUMIN 3.2*  AST 16  ALT 11*  ALKPHOS 18*  BILITOT 0.7   PT/INR  Recent Labs  06/16/15 1052 06/17/15 0543  LABPROT 32.5* 35.7*  INR 3.25* 3.68*   Hepatitis Panel No results for input(s): HEPBSAG, HCVAB, HEPAIGM, HEPBIGM in the last 72 hours. C-Diff No results for input(s): CDIFFTOX in the last 72 hours.  Studies/Results: Dg Chest Port 1 View  06/15/2015  CLINICAL DATA:  Abdominal pain.  Admitted for recurrent GI bleed. EXAM: PORTABLE CHEST 1 VIEW COMPARISON:  Radiograph 05/30/2015 FINDINGS: Sternotomy wires overlie stable enlarged cardiac silhouette. Chronic elevation LEFT hemidiaphragm. No effusion infiltrate pneumothorax. IMPRESSION: No acute cardiopulmonary findings. Cardiomegaly and elevation of  LEFT hemidiaphragm Electronically Signed   By: Suzy Bouchard M.D.   On: 06/15/2015 16:05   Dg Abd Portable 1v  06/15/2015  CLINICAL DATA:  Abdominal pain.  Recurrent GI bleeding. EXAM: PORTABLE ABDOMEN - 1 VIEW COMPARISON:  None. FINDINGS: The bowel gas pattern is normal. Extensive aortic and visceral arterial vascular calcification noted. IMPRESSION: No acute findings. Electronically Signed   By: Earle Gell M.D.   On: 06/15/2015 16:10    Assessment: Principal Problem:   GI bleed Active Problems:   H/O aortic valve replacement- [employer]   PVD prior SFA PTA with chronic LE disease, not amenable to PTA   DM2 (diabetes mellitus, type 2) (HCC)   Hyperlipidemia with target LDL less than 70   COPD (chronic obstructive pulmonary disease) (HCC)   Chronic anticoagulation   Acute blood loss anemia   Chronic systolic heart failure (HCC)   Coronary artery disease involving native coronary artery of native heart with angina pectoris ([place])   Essential hypertension   History of coronary artery stent placement   Anticoagulation management encounter   Bleeding gastrointestinal  LOS: 3 days   Impression:   History of GI bleed without active bleeding at this time. Capsule study recently compelling for a gastric ulcer. EGD prior to discharge. INR continues to be riding high.  Recommendations:  EGD will likely be next week once INR in range (i.e. 1.6-1.8). Will reassess 10/31. Continue PPI.   Manus Rudd  06/17/2015,  12:49 PM

## 2015-06-17 NOTE — Progress Notes (Signed)
Assisted patient to bedside commode. Passed black soft formed stool and voided with stool.

## 2015-06-17 NOTE — Progress Notes (Signed)
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 Patient had a recent hospitalization(10/11-10/14/2016) for GI bleeding and underwent a GI workup. He had to be transfused 3 units due to a hemoglobin level of 5.5. Upon discharge on 06/02/2015 his hemoglobin was 8.1. He had an outpatient capsule endoscopy which revealed an ulcer in the small bowel however he reported he has been compliant with his Plavix and Coumadine due to his hx of cardiac stents and mechanical AVR.  While in the ED, his initial hgb was 7.3 and INR was 2.99.  He was transfused 1U and placed on IV Protonix. He was admitted for further management.     Assessment/Plan: 1. GI bleed, suspect upper with elevated BUN and recent capsule study results showing gastric ulcer and erosions. Complicated by warfarin and Plavix. No bleeding noted. Plan  upper endoscopy once INR is below 1.8.   2. ABLA. Stable s/p transfusion of 2U PRBCs. Transfuse as needed and monitor.   3. 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 Warfarin on hold, no need for Heparin bridge per cardiology.  4. CAD with multiple stents 5. PVD s/p SMA stent 02/2015, last seen by Dr. Earleen Newport 10/18, per documentation recommended against antiplatelet agent. 6. AKI, resolved.  7. DM type 2, stable. Continue SSI. 8. HLD, continue statin. 9. COPD, stable without evidence of exacerbation.  10. Chronic systolic heart failure, appears compensated. 11. Essential HTN, continue home meds and monitor. Appears stable.    Stable. No bleeding noted.   Continue PPI and check CBC in AM  Daily INR, no need for Heparin bridge.   Plan for endoscopy when INR <2.   Code Status: Full DVT prophylaxis: SCDs Family Communication: Discussed with patient who understands and has no concerns at this time. Disposition  Plan:Anticipate discharge within 2-3 days.   [name], MD  [facility]  Pager [phone] If 7PM-7AM, please contact night-coverage at [email or web address], password [facility] 06/17/2015, 7:32 AM  LOS: 3 days   Consultants:  GI  Cardiology   Procedures:  Transfused 2U PRBCs  Antibiotics:    HPI/Subjective: Doesn't have much of an appetite but denies any nausea, vomiting, SOB, pain, or bleeding.    Objective: Filed Vitals:   06/16/15 1000 06/16/15 1133 06/16/15 2149 06/17/15 0603  BP: 127/52  147/60 139/55  Pulse: 29  44 57  Temp:  96.9 F (36.1 C) 97.8 F (36.6 C) 98.1 F (36.7 C)  TempSrc:  Oral Oral Oral  Resp: '24  20 20  '$ Height:      Weight:      SpO2: 100%  100% 97%    Intake/Output Summary (Last 24 hours) at 06/17/15 0732 Last data filed at 06/17/15 0604  Gross per 24 hour  Intake   1065 ml  Output   1200 ml  Net   -135 ml     Filed Weights   06/14/15 2151 06/15/15 0500 06/16/15 0500  Weight: 91.4 kg (201 lb 8 oz) 91 kg (200 lb 9.9 oz) 90.9 kg (200 lb 6.4 oz)    Exam:     VSS, afebrile, not hypoxic General:  Appears calm and comfortable Cardiovascular: RRR, click 2/6 systolic murmur RUSB. No r/g. No RLE edema Respiratory: CTA bilaterally, no w/r/r. Normal respiratory effort. Abdomen: soft, ntnd.   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 sodium chloride   Intravenous Once  . allopurinol  300 mg Oral Daily  . cholecalciferol  2,000 Units Oral Daily  . insulin aspart  0-9 Units Subcutaneous TID WC  . isosorbide mononitrate  30 mg Oral Daily  . omega-3 acid ethyl esters  1 g Oral BID  . pantoprazole (PROTONIX) IV  40 mg Intravenous Q12H  . potassium chloride SA  20 mEq Oral Daily  . rosuvastatin  20 mg Oral Daily   Continuous Infusions:    Principal  Problem:   GI bleed Active Problems:   H/O aortic valve replacement- [employer]   PVD prior SFA PTA with chronic LE disease, not amenable to PTA   DM2 (diabetes mellitus, type 2) (HCC)   Hyperlipidemia with target LDL less than 70   COPD (chronic obstructive pulmonary disease) (HCC)   Chronic anticoagulation   Acute blood loss anemia   Chronic systolic heart failure (HCC)   Coronary artery disease involving native coronary artery of native heart with angina pectoris ([place])   Essential hypertension   History of coronary artery stent placement   Anticoagulation management encounter   Bleeding gastrointestinal   Time spent 20 minutes  By signing my name below, I, Rosalie Doctor attest that this documentation has been prepared under the direction and in the presence of [name], MD Electronically signed: Rosalie Doctor, Scribe. 06/17/2015 9:42am  I personally performed the services described in this documentation. All medical record entries made by the scribe were at my direction. I have reviewed the chart and agree that the record reflects my personal performance and is accurate and complete. [name], MD

## 2015-06-18 LAB — TYPE AND SCREEN
ABO/RH(D): A POS
Antibody Screen: NEGATIVE
UNIT DIVISION: 0
UNIT DIVISION: 0
UNIT DIVISION: 0
Unit division: 0
Unit division: 0
Unit division: 0

## 2015-06-18 LAB — GLUCOSE, CAPILLARY
GLUCOSE-CAPILLARY: 132 mg/dL — AB (ref 65–99)
Glucose-Capillary: 138 mg/dL — ABNORMAL HIGH (ref 65–99)
Glucose-Capillary: 144 mg/dL — ABNORMAL HIGH (ref 65–99)
Glucose-Capillary: 151 mg/dL — ABNORMAL HIGH (ref 65–99)

## 2015-06-18 LAB — CBC
HEMATOCRIT: 26.7 % — AB (ref 39.0–52.0)
HEMOGLOBIN: 8.3 g/dL — AB (ref 13.0–17.0)
MCH: 25 pg — ABNORMAL LOW (ref 26.0–34.0)
MCHC: 31.1 g/dL (ref 30.0–36.0)
MCV: 80.4 fL (ref 78.0–100.0)
Platelets: 242 10*3/uL (ref 150–400)
RBC: 3.32 MIL/uL — ABNORMAL LOW (ref 4.22–5.81)
RDW: 18.6 % — AB (ref 11.5–15.5)
WBC: 5.3 10*3/uL (ref 4.0–10.5)

## 2015-06-18 LAB — PROTIME-INR
INR: 2.7 — ABNORMAL HIGH (ref 0.00–1.49)
Prothrombin Time: 28.3 seconds — ABNORMAL HIGH (ref 11.6–15.2)

## 2015-06-18 NOTE — Progress Notes (Signed)
Up in chair with assist.

## 2015-06-18 NOTE — Progress Notes (Signed)
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 Patient had a recent hospitalization(10/11-10/14/2016) for GI bleeding and underwent a GI workup. He had to be transfused 3 units due to a hemoglobin level of 5.5. Upon discharge on 06/02/2015 his hemoglobin was 8.1. He had an outpatient capsule endoscopy which revealed an ulcer in the small bowel however he reported he has been compliant with his Plavix and Coumadine due to his hx of cardiac stents and mechanical AVR.  While in the ED, his initial hgb was 7.3 and INR was 2.99.  He was transfused 1U and placed on IV Protonix. He was admitted for further management.     Assessment/Plan: 1. GI bleed, suspect upper with elevated BUN and recent capsule study results showing gastric ulcer and erosions. Complicated by warfarin and Plavix. No bleeding noted. INR trending down. Plan for upper endoscopy once INR is below 1.8.   2. ABLA. Stable s/p transfusion of 2U PRBCs.   3. 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 Warfarin on hold, no need for heparin bridge per cardiology.  4. CAD with multiple stents 5. PVD s/p SMA stent 02/2015, last seen by Dr. Earleen Newport 10/18, per documentation recommended against antiplatelet agent. 6. AKI, resolved.  7. DM type 2, remains stable. Continue SSI. 8. COPD, stable without evidence of exacerbation.  9. Chronic systolic CHF, appears compensated. 10. Essential HTN, continue home meds and monitor. Appears stable.    Remains stable. No obvious bleeding noted.  Continue PPI and daily INR.   Plan for upper endoscopy when INR acceptable  Code Status: Full DVT prophylaxis: SCDs Family Communication: Discussed with patient who understands and has no concerns at this time. Friend bedside.  Disposition Plan:Anticipate discharge within 1-2 days.    [name], MD  [facility]  Pager [phone] If 7PM-7AM, please contact night-coverage at [email or web address], password [email or web address] 06/18/2015, 7:09 AM  LOS: 4 days   Consultants:  GI  Cardiology   Procedures:  Transfused 2U PRBCs  Antibiotics:    HPI/Subjective: Feeling good. Denies any nausea, vomiting, and pain has resolved with medications. Able to eat and drink without difficulty. Denies bleeding but did have a dark stool.    Objective: Filed Vitals:   06/17/15 0603 06/17/15 1249 06/17/15 2047 06/18/15 0641  BP: 139/55 130/36 152/58 126/46  Pulse: 57 58 71 64  Temp: 98.1 F (36.7 C) 98.1 F (36.7 C) 98.4 F (36.9 C) 98.4 F (36.9 C)  TempSrc: Oral Oral Oral Oral  Resp: '20 20 20 14  '$ Height:      Weight:      SpO2: 97% 97% 99% 97%    Intake/Output Summary (Last 24 hours) at 06/18/15 0709 Last data filed at 06/18/15 0400  Gross per 24 hour  Intake    820 ml  Output   1302 ml  Net   -482 ml     Filed Weights   06/14/15 2151 06/15/15 0500 06/16/15 0500  Weight: 91.4 kg (201 lb 8 oz) 91 kg (200 lb 9.9 oz) 90.9 kg (200 lb 6.4 oz)    Exam:  VSS, afebrile, not hypoxic General:  Appears comfortable and calm. Cardiovascular: Regular rate and rhythm few premature beats. 2/6 systolic murmur with click RUSB. No rub or gallop. No lower extremity edema. Respiratory: Clear to auscultation bilaterally, no wheezes, rales or rhonchi. Normal respiratory effort. Abdomen: soft,  nt nd. Soft umbilical hernia that is non tender and easily reduced.  Psychiatric: grossly normal mood and affect, speech fluent and appropriate  New data reviewed:  INR trending down 2.70  Hgb, stable 8.3  CBG stable  Scheduled Meds: . sodium chloride   Intravenous Once  . allopurinol  300 mg Oral Daily  . cholecalciferol  2,000 Units Oral Daily  . insulin aspart  0-9 Units Subcutaneous TID WC  . isosorbide mononitrate  30 mg Oral Daily  . omega-3 acid ethyl esters  1 g Oral BID   . pantoprazole (PROTONIX) IV  40 mg Intravenous Q12H  . potassium chloride SA  20 mEq Oral Daily  . rosuvastatin  20 mg Oral Daily   Continuous Infusions:    Principal Problem:   GI bleed Active Problems:   H/O aortic valve replacement- [employer]   PVD prior SFA PTA with chronic LE disease, not amenable to PTA   DM2 (diabetes mellitus, type 2) (HCC)   Hyperlipidemia with target LDL less than 70   COPD (chronic obstructive pulmonary disease) (HCC)   Chronic anticoagulation   Acute blood loss anemia   Chronic systolic heart failure (HCC)   Coronary artery disease involving native coronary artery of native heart with angina pectoris ([place])   Essential hypertension   History of coronary artery stent placement   Anticoagulation management encounter   Bleeding gastrointestinal   Time spent: 15 minutes   By signing my name below, I, Rennis Harding attest that this documentation has been prepared under the direction and in the presence of [name], MD Electronically signed: Rennis Harding  06/18/2015 11:52am.  I personally performed the services described in this documentation. All medical record entries made by the scribe were at my direction. I have reviewed the chart and agree that the record reflects my personal performance and is accurate and complete. [name], MD

## 2015-06-19 ENCOUNTER — Ambulatory Visit: Payer: Medicare Other | Admitting: Gastroenterology

## 2015-06-19 ENCOUNTER — Telehealth: Payer: Self-pay | Admitting: Nurse Practitioner

## 2015-06-19 LAB — GLUCOSE, CAPILLARY
GLUCOSE-CAPILLARY: 139 mg/dL — AB (ref 65–99)
GLUCOSE-CAPILLARY: 178 mg/dL — AB (ref 65–99)
Glucose-Capillary: 187 mg/dL — ABNORMAL HIGH (ref 65–99)
Glucose-Capillary: 176 mg/dL — ABNORMAL HIGH (ref 65–99)

## 2015-06-19 LAB — CBC
HCT: 28.4 % — ABNORMAL LOW (ref 39.0–52.0)
Hemoglobin: 8.6 g/dL — ABNORMAL LOW (ref 13.0–17.0)
MCH: 24.7 pg — ABNORMAL LOW (ref 26.0–34.0)
MCHC: 30.3 g/dL (ref 30.0–36.0)
MCV: 81.6 fL (ref 78.0–100.0)
PLATELETS: 243 10*3/uL (ref 150–400)
RBC: 3.48 MIL/uL — AB (ref 4.22–5.81)
RDW: 19.8 % — AB (ref 11.5–15.5)
WBC: 5.3 10*3/uL (ref 4.0–10.5)

## 2015-06-19 LAB — PROTIME-INR
INR: 2.49 — AB (ref 0.00–1.49)
PROTHROMBIN TIME: 26.6 s — AB (ref 11.6–15.2)

## 2015-06-19 NOTE — Progress Notes (Signed)
Patient ID: Tanner Pena, male   DOB: 05/18/1941, 74 y.o.   MRN: [id number]    Dr Court Joy consult note reviewed. Plavix stopped permanently, coumadin on hold in setting of GI bleed. WIth mechanical AVR and no high risk factors bridinging is not neccesary. INR 2.49 today, from GI notes plan for EGD once INR 1.8. No further cardiac recs, will follow peripherally. Please call with questions.    Zandra Abts MD

## 2015-06-19 NOTE — Progress Notes (Signed)
    Subjective: Poor historian. Believes he may have seen black stool. No abdominal pain, no N/V. Wants to be at home for halloween.   Objective: Vital signs in last 24 hours: Temp:  [98.1 F (36.7 C)-98.4 F (36.9 C)] 98.4 F (36.9 C) (10/31 0518) Pulse Rate:  [50-81] 55 (10/31 0518) Resp:  [14-20] 14 (10/31 0518) BP: (134-157)/(42-68) 134/68 mmHg (10/30 1918) SpO2:  [99 %-100 %] 100 % (10/31 0518) Last BM Date: 06/17/15 General:   Alert and oriented, pleasant Head:  Normocephalic and atraumatic. Eyes:  No icterus, sclera clear. Conjuctiva pink.  Abdomen:  Bowel sounds present, soft, non-tender, non-distended.  No rebound or guarding. Small umbilical hernia.  Msk:  Left BKA Neurologic:  Alert and  oriented x4 Psych:  Alert and cooperative. Normal mood and affect.  Intake/Output from previous day: 10/30 0701 - 10/31 0700 In: 1080 [P.O.:1080] Out: 1200 [Urine:1200] Intake/Output this shift:    Lab Results:  Recent Labs  06/18/15 0548  WBC 5.3  HGB 8.3*  HCT 26.7*  PLT 242   PT/INR  Recent Labs  06/18/15 0548 06/19/15 0550  LABPROT 28.3* 26.6*  INR 2.70* 2.49*     Assessment: 74 year old male with complicated past medical history, admitted with likely upper GI bleed, with recent outpatient capsule study noting gastric ulcer and erosions. Recent colonoscopy up-to-date. Presentation complicated with anticoagulation, now waiting for INR to drift to around 1.8 prior to EGD. Today, INR 2.49. No bridging necessary. Possible melena recently; will update CBC this morning. Recommend transfuse as needed until EGD performed.   Plan: Plavix to remain discontinued indefinitely (this has been emphasized to patient on multiple occasions but was continued for unknown reasons) Continue PPI BID May have diabetic diet today and NPO after midnight Will evaluate again tomorrow, Nov 1st, for possible EGD CBC today   Orvil Feil, ANP-BC [facility]     LOS:  5 days    06/19/2015, 9:47 AM

## 2015-06-19 NOTE — Progress Notes (Signed)
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 Patient had a recent hospitalization(10/11-10/14/2016) for GI bleeding and underwent a GI workup. He had to be transfused 3 units due to a hemoglobin level of 5.5. Upon discharge on 06/02/2015 his hemoglobin was 8.1. He had an outpatient capsule endoscopy which revealed an ulcer in the small bowel however he reported he has been compliant with his Plavix and Coumadine due to his hx of cardiac stents and mechanical AVR.  While in the ED, his initial hgb was 7.3 and INR was 2.99.  He was transfused 1U and placed on IV Protonix. He was admitted for further management.     Assessment/Plan: 1. GI bleed, suspect upper with elevated BUN and recent capsule study results showing gastric ulcer and erosions. Complicated by warfarin and Plavix. INR continues to trend down. Plan for upper endoscopy once INR is below 1.8.   2. ABLA. Remains stable s/p transfusion of 2U PRBCs.   3. 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 Warfarin on hold, no need for heparin bridge per cardiology.  4. CAD with multiple stents 5. PVD s/p SMA stent 02/2015, last seen by Dr. Earleen Newport 10/18, per documentation recommended against antiplatelet agent. 6. AKI, resolved.  7. DM type 2, stable. Will continue SSI. 8. COPD, remains stable without evidence of exacerbation.  9. Chronic systolic CHF, compensated. 10. Essential HTN, continue home meds and monitor. Appears stable.    Remains stable.  Check INR in AM  Hopefully EGD and home 11/1  Code Status: Full DVT prophylaxis: SCDs Family Communication: Discussed with patient who understands and has no concerns at this time. Disposition Plan:Anticipate discharge within 1 day.   [name], MD  [facility]  Pager [phone] If 7PM-7AM, please  contact night-coverage at [email or web address], password [facility] 06/19/2015, 7:05 AM  LOS: 5 days   Consultants:  GI  Cardiology   Procedures:  Transfused 2U PRBCs  Antibiotics:    HPI/Subjective: Feeling good. Able to eat and drink without difficulty. Denies any nausea, vomiting, pain or obvious bleeding.   Objective: Filed Vitals:   06/18/15 1048 06/18/15 1518 06/18/15 1918 06/19/15 0518  BP: 134/42 157/68 134/68   Pulse: 50 81 50 55  Temp: 98.1 F (36.7 C) 98.2 F (36.8 C) 98.4 F (36.9 C) 98.4 F (36.9 C)  TempSrc: Oral Oral Oral Oral  Resp: '18 18 20 14  '$ Height:      Weight:      SpO2: 99% 100% 100% 100%    Intake/Output Summary (Last 24 hours) at 06/19/15 0705 Last data filed at 06/19/15 0519  Gross per 24 hour  Intake   1080 ml  Output   1200 ml  Net   -120 ml     Filed Weights   06/14/15 2151 06/15/15 0500 06/16/15 0500  Weight: 91.4 kg (201 lb 8 oz) 91 kg (200 lb 9.9 oz) 90.9 kg (200 lb 6.4 oz)    Exam:  VSS, afebrile, not hypoxic General:  Appears calm and comfortable. Sitting up in bed Cardiovascular: RRR, no m/r/g. No LE edema. Mechanical click noted.  Respiratory: CTA bilaterally, no w/r/r. Normal respiratory effort. Psychiatric: grossly normal mood and affect, speech fluent and appropriate  New data reviewed:  INR trending down 2.49  Hgb stable, 8.6  Scheduled Meds: . sodium chloride   Intravenous Once  . allopurinol  300 mg Oral Daily  . cholecalciferol  2,000 Units Oral Daily  . insulin aspart  0-9 Units Subcutaneous TID WC  . isosorbide mononitrate  30 mg Oral Daily  . omega-3 acid ethyl esters  1 g Oral BID  . pantoprazole (PROTONIX) IV  40 mg Intravenous Q12H  . potassium chloride SA  20 mEq Oral Daily  . rosuvastatin  20 mg Oral Daily   Continuous Infusions:    Principal Problem:   GI bleed Active Problems:   H/O aortic valve replacement- St Jude   PVD prior SFA PTA with chronic LE disease, not amenable to PTA   DM2  (diabetes mellitus, type 2) (HCC)   Hyperlipidemia with target LDL less than 70   COPD (chronic obstructive pulmonary disease) (HCC)   Chronic anticoagulation   Acute blood loss anemia   Chronic systolic heart failure (HCC)   Coronary artery disease involving native coronary artery of native heart with angina pectoris ([place])   Essential hypertension   History of coronary artery stent placement   Anticoagulation management encounter   Bleeding gastrointestinal   Time spent: 15 minutes   By signing my name below, I, Rennis Harding attest that this documentation has been prepared under the direction and in the presence of [name], MD Electronically signed: Rennis Harding  06/19/2015 2:31pm  I personally performed the services described in this documentation. All medical record entries made by the scribe were at my direction. I have reviewed the chart and agree that the record reflects my personal performance and is accurate and complete. [name], MD

## 2015-06-19 NOTE — Telephone Encounter (Signed)
Noted  

## 2015-06-19 NOTE — Telephone Encounter (Signed)
Patient is currently admitted in the hospital.

## 2015-06-20 ENCOUNTER — Encounter (HOSPITAL_COMMUNITY)
Admission: EM | Disposition: A | Discharge status: Home / Self Care | Payer: Self-pay | Source: Home / Self Care | Attending: Family Medicine

## 2015-06-20 ENCOUNTER — Other Ambulatory Visit: Payer: Self-pay

## 2015-06-20 ENCOUNTER — Observation Stay (HOSPITAL_COMMUNITY): Payer: Medicare Other | Admitting: Anesthesiology

## 2015-06-20 ENCOUNTER — Encounter (HOSPITAL_COMMUNITY): Payer: Self-pay

## 2015-06-20 DIAGNOSIS — K2950 Unspecified chronic gastritis without bleeding: Secondary | ICD-10-CM | POA: Diagnosis not present

## 2015-06-20 DIAGNOSIS — R079 Chest pain, unspecified: Secondary | ICD-10-CM

## 2015-06-20 DIAGNOSIS — K921 Melena: Secondary | ICD-10-CM | POA: Diagnosis present

## 2015-06-20 DIAGNOSIS — D649 Anemia, unspecified: Secondary | ICD-10-CM | POA: Insufficient documentation

## 2015-06-20 DIAGNOSIS — K922 Gastrointestinal hemorrhage, unspecified: Secondary | ICD-10-CM | POA: Diagnosis present

## 2015-06-20 DIAGNOSIS — K2971 Gastritis, unspecified, with bleeding: Secondary | ICD-10-CM

## 2015-06-20 HISTORY — PX: ESOPHAGOGASTRODUODENOSCOPY (EGD) WITH PROPOFOL: SHX5813

## 2015-06-20 HISTORY — PX: BIOPSY: SHX5522

## 2015-06-20 LAB — BASIC METABOLIC PANEL
Anion gap: 6 (ref 5–15)
BUN: 13 mg/dL (ref 6–20)
CALCIUM: 9.5 mg/dL (ref 8.9–10.3)
CO2: 25 mmol/L (ref 22–32)
Chloride: 109 mmol/L (ref 101–111)
Creatinine, Ser: 1.08 mg/dL (ref 0.61–1.24)
GFR calc Af Amer: >60 mL/min (ref >60)
GLUCOSE: 168 mg/dL — AB (ref 65–99)
Potassium: 4.1 mmol/L (ref 3.5–5.1)
Sodium: 140 mmol/L (ref 135–145)

## 2015-06-20 LAB — PROTIME-INR
INR: 1.7 — ABNORMAL HIGH (ref 0.00–1.49)
Prothrombin Time: 19.9 seconds — ABNORMAL HIGH (ref 11.6–15.2)

## 2015-06-20 LAB — CBC
HCT: 30.9 % — ABNORMAL LOW (ref 39.0–52.0)
Hemoglobin: 9.4 g/dL — ABNORMAL LOW (ref 13.0–17.0)
MCH: 24.5 pg — AB (ref 26.0–34.0)
MCHC: 30.4 g/dL (ref 30.0–36.0)
MCV: 80.7 fL (ref 78.0–100.0)
PLATELETS: 247 10*3/uL (ref 150–400)
RBC: 3.83 MIL/uL — ABNORMAL LOW (ref 4.22–5.81)
RDW: 19.8 % — AB (ref 11.5–15.5)
WBC: 6.4 10*3/uL (ref 4.0–10.5)

## 2015-06-20 LAB — HEMOGLOBIN AND HEMATOCRIT, BLOOD
HCT: 27.2 % — ABNORMAL LOW (ref 39.0–52.0)
Hemoglobin: 8.3 g/dL — ABNORMAL LOW (ref 13.0–17.0)

## 2015-06-20 LAB — GLUCOSE, CAPILLARY
GLUCOSE-CAPILLARY: 126 mg/dL — AB (ref 65–99)
Glucose-Capillary: 143 mg/dL — ABNORMAL HIGH (ref 65–99)
Glucose-Capillary: 135 mg/dL — ABNORMAL HIGH (ref 65–99)
Glucose-Capillary: 174 mg/dL — ABNORMAL HIGH (ref 65–99)
Glucose-Capillary: 194 mg/dL — ABNORMAL HIGH (ref 65–99)

## 2015-06-20 LAB — TROPONIN I
Troponin I: 0.05 ng/mL — ABNORMAL HIGH (ref <0.031)
Troponin I: 0.51 ng/mL (ref <0.031)

## 2015-06-20 SURGERY — ESOPHAGOGASTRODUODENOSCOPY (EGD) WITH PROPOFOL
Anesthesia: Monitor Anesthesia Care | Site: Esophagus

## 2015-06-20 MED ORDER — MORPHINE SULFATE (PF) 4 MG/ML IV SOLN
4.0000 mg | INTRAVENOUS | Status: DC | PRN
Start: 2015-06-20 — End: 2015-06-23
  Administered 2015-06-20 – 2015-06-23 (×3): 4 mg via INTRAVENOUS
  Filled 2015-06-20 (×3): qty 1

## 2015-06-20 MED ORDER — MIDAZOLAM HCL 2 MG/2ML IJ SOLN
1.0000 mg | INTRAMUSCULAR | Status: DC | PRN
  Administered 2015-06-20 (×3): 2 mg via INTRAVENOUS

## 2015-06-20 MED ORDER — ATROPINE SULFATE 0.4 MG/ML IJ SOLN
INTRAMUSCULAR | Status: DC | PRN
  Administered 2015-06-20 (×2): 0.4 mg via INTRAVENOUS

## 2015-06-20 MED ORDER — FENTANYL CITRATE (PF) 100 MCG/2ML IJ SOLN
25.0000 ug | INTRAMUSCULAR | Status: AC
  Administered 2015-06-20 (×2): 25 ug via INTRAVENOUS

## 2015-06-20 MED ORDER — METOPROLOL TARTRATE 1 MG/ML IV SOLN
5.0000 mg | Freq: Once | INTRAVENOUS | Status: AC
  Administered 2015-06-20: 2 mg via INTRAVENOUS
  Administered 2015-06-20: 1 mg via INTRAVENOUS

## 2015-06-20 MED ORDER — MORPHINE SULFATE (PF) 4 MG/ML IV SOLN
4.0000 mg | Freq: Once | INTRAVENOUS | Status: AC
  Administered 2015-06-20: 4 mg via INTRAVENOUS
  Filled 2015-06-20: qty 1

## 2015-06-20 MED ORDER — FENTANYL CITRATE (PF) 100 MCG/2ML IJ SOLN: 25.0000 ug | INTRAMUSCULAR | Status: DC | PRN

## 2015-06-20 MED ORDER — STERILE WATER FOR IRRIGATION IR SOLN
Status: DC | PRN
  Administered 2015-06-20: 1000 mL

## 2015-06-20 MED ORDER — NITROGLYCERIN 0.4 MG SL SUBL
SUBLINGUAL_TABLET | SUBLINGUAL | Status: AC
  Filled 2015-06-20: qty 1

## 2015-06-20 MED ORDER — SODIUM CHLORIDE 0.9 % IV SOLN: INTRAVENOUS | Status: DC

## 2015-06-20 MED ORDER — PROPOFOL 10 MG/ML IV BOLUS
INTRAVENOUS | Status: AC
  Filled 2015-06-20: qty 20

## 2015-06-20 MED ORDER — PANTOPRAZOLE SODIUM 40 MG PO TBEC
40.0000 mg | DELAYED_RELEASE_TABLET | Freq: Two times a day (BID) | ORAL | Status: DC
  Administered 2015-06-20 – 2015-06-23 (×6): 40 mg via ORAL
  Filled 2015-06-20 (×6): qty 1

## 2015-06-20 MED ORDER — NITROGLYCERIN 0.4 MG SL SUBL
0.4000 mg | SUBLINGUAL_TABLET | SUBLINGUAL | Status: DC | PRN
  Administered 2015-06-20 (×2): 0.4 mg via SUBLINGUAL

## 2015-06-20 MED ORDER — METOPROLOL TARTRATE 1 MG/ML IV SOLN
INTRAVENOUS | Status: AC
  Filled 2015-06-20: qty 5

## 2015-06-20 MED ORDER — MIDAZOLAM HCL 2 MG/2ML IJ SOLN
INTRAMUSCULAR | Status: AC
  Filled 2015-06-20: qty 2

## 2015-06-20 MED ORDER — ONDANSETRON HCL 4 MG/2ML IJ SOLN: 4.0000 mg | Freq: Once | INTRAMUSCULAR | Status: DC | PRN

## 2015-06-20 MED ORDER — EPHEDRINE SULFATE 50 MG/ML IJ SOLN
INTRAMUSCULAR | Status: DC | PRN
  Administered 2015-06-20: 10 mg via INTRAVENOUS

## 2015-06-20 MED ORDER — MORPHINE SULFATE (PF) 2 MG/ML IV SOLN: 2.0000 mg | Freq: Once | INTRAVENOUS | Status: DC

## 2015-06-20 MED ORDER — METOPROLOL SUCCINATE ER 25 MG PO TB24
25.0000 mg | ORAL_TABLET | Freq: Every day | ORAL | Status: DC
  Administered 2015-06-20 – 2015-06-21 (×2): 25 mg via ORAL
  Filled 2015-06-20 (×2): qty 1

## 2015-06-20 MED ORDER — PROPOFOL 500 MG/50ML IV EMUL
INTRAVENOUS | Status: DC | PRN
  Administered 2015-06-20: 75 ug/kg/min via INTRAVENOUS

## 2015-06-20 MED ORDER — LIDOCAINE VISCOUS 2 % MT SOLN
15.0000 mL | Freq: Once | OROMUCOSAL | Status: AC
Start: 2015-06-20 — End: 2015-06-20
  Administered 2015-06-20: 15 mL via OROMUCOSAL

## 2015-06-20 MED ORDER — FENTANYL CITRATE (PF) 100 MCG/2ML IJ SOLN
INTRAMUSCULAR | Status: AC
  Filled 2015-06-20: qty 2

## 2015-06-20 MED ORDER — LACTATED RINGERS IV SOLN
INTRAVENOUS | Status: DC
  Administered 2015-06-20: 11:00:00 via INTRAVENOUS

## 2015-06-20 MED ORDER — LIDOCAINE HCL (PF) 1 % IJ SOLN
INTRAMUSCULAR | Status: AC
  Filled 2015-06-20: qty 5

## 2015-06-20 MED ORDER — NITROGLYCERIN IN D5W 200-5 MCG/ML-% IV SOLN
0.0000 ug/min | INTRAVENOUS | Status: DC
Start: 2015-06-20 — End: 2015-06-22
  Administered 2015-06-20: 5 ug/min via INTRAVENOUS
  Administered 2015-06-20: 100 ug/min via INTRAVENOUS
  Filled 2015-06-20 (×2): qty 250

## 2015-06-20 MED ORDER — LIDOCAINE VISCOUS 2 % MT SOLN
OROMUCOSAL | Status: AC
  Filled 2015-06-20: qty 15

## 2015-06-20 SURGICAL SUPPLY — 18 items
BLOCK BITE 60FR ADLT L/F BLUE (MISCELLANEOUS) ×1 IMPLANT
ELECT REM PT RETURN 9FT ADLT (ELECTROSURGICAL)
ELECTRODE REM PT RTRN 9FT ADLT (ELECTROSURGICAL) IMPLANT
FLOOR PAD 36X40 (MISCELLANEOUS) ×2
FORCEPS BIOP RAD 4 LRG CAP 4 (CUTTING FORCEPS) ×1 IMPLANT
FORMALIN 10 PREFIL 20ML (MISCELLANEOUS) ×1 IMPLANT
KIT ENDO PROCEDURE PEN (KITS) ×2 IMPLANT
MANIFOLD NEPTUNE II (INSTRUMENTS) ×2 IMPLANT
NDL SCLEROTHERAPY 25GX240 (NEEDLE) IMPLANT
NEEDLE SCLEROTHERAPY 25GX240 (NEEDLE) IMPLANT
PAD FLOOR 36X40 (MISCELLANEOUS) IMPLANT
PROBE APC STR FIRE (PROBE) IMPLANT
PROBE INJECTION GOLD (MISCELLANEOUS)
PROBE INJECTION GOLD 7FR (MISCELLANEOUS) IMPLANT
SNARE SHORT THROW 13M SML OVAL (MISCELLANEOUS) IMPLANT
SYR INFLATION 60ML (SYRINGE) IMPLANT
TUBING IRRIGATION ENDOGATOR (MISCELLANEOUS) ×1 IMPLANT
WATER STERILE IRR 1000ML POUR (IV SOLUTION) ×1 IMPLANT

## 2015-06-20 NOTE — Progress Notes (Addendum)
Patient with EGD today, showed moderate to severe gastritis. Plans to resume coumadin tomorrow, stay off plavix. Dr Everett Graff note reviewed, discussed with Dr Oneida Alar. Patient had some bradycardia during EGD procedure while on propofol to 30s, received some rounds of atropine with rates increased to 80s. Later in PACU patient with episode of chest pain. He states he felt fine initially, however immediately after drinking some water had severe pain in midchest 8/10 in severity radiating to left arm. Symptoms have been on and off but have times of continuous pain nearly up to 2 years. Better with belching. Patient had multiple gastric biopsies, I suspect his pain is related to this given history and sudden onset after drinking water. EKG I have is from Nov 1 at 1248 pm which shows sinus tach 125 with chronic LBBB. EKG at 1253 shows SR with LBBB and PVCs, which is also chronic for the patient. Currently on tele sinus rates 105.   Due to chronic LBBB cannot interpret EKG for ischemia. Would cycle enzymes overnight. NG drip ok for now given he is hypertenstive, if pain subsides and bp can try stopping later this evening. Would not start ASA or heparin unless + trop given his GI bleed. Restart his home beta blocker at lower dose. Start imdur when off NG drip.    Zandra Abts MD

## 2015-06-20 NOTE — Transfer of Care (Signed)
Immediate Anesthesia Transfer of Care Note  Patient: Tanner Pena  Procedure(s) Performed: Procedure(s): ESOPHAGOGASTRODUODENOSCOPY (EGD) WITH PROPOFOL (N/A) BIOPSY (N/A)  Patient Location: PACU  Anesthesia Type:MAC  Level of Consciousness: awake, alert , oriented and patient cooperative  Airway & Oxygen Therapy: Patient Spontanous Breathing and Patient connected to nasal cannula oxygen  Post-op Assessment: Report given to RN and Post -op Vital signs reviewed and stable  Post vital signs: Reviewed and stable  Last Vitals:  Filed Vitals:   06/20/15 1219  BP:   Pulse: 93  Temp: 36.6 C  Resp:     Complications: No apparent anesthesia complications

## 2015-06-20 NOTE — Discharge Instructions (Signed)
YOUR BLOOD LOSS IS DUE TO EROSIVE GASTRITIS. I biopsied your stomach.   I SPOKE TO DR. BARRY. STOP THE PLAVIX. RE-START COUMADIN ON NOV 2.  START PROTONIX. TAKE 30 MINUTES PRIOR TO MEALS TWICE DAILY.  AVOID TRIGGERS FOR GASTRITIS. SEE INFO BELOW.   FOLLOW UP IN 3 MOS WITH DR. [name].  UPPER ENDOSCOPY AFTER CARE Read the instructions outlined below and refer to this sheet in the next week. These discharge instructions provide you with general information on caring for yourself after you leave the hospital. While your treatment has been planned according to the most current medical practices available, unavoidable complications occasionally occur. If you have any problems or questions after discharge, call DR. Paulmichael Schreck, [phone].  ACTIVITY  You may resume your regular activity, but move at a slower pace for the next 24 hours.   Take frequent rest periods for the next 24 hours.   Walking will help get rid of the air and reduce the bloated feeling in your belly (abdomen).   No driving for 24 hours (because of the medicine (anesthesia) used during the test).   You may shower.   Do not sign any important legal documents or operate any machinery for 24 hours (because of the anesthesia used during the test).    NUTRITION  Drink plenty of fluids.   You may resume your normal diet as instructed by your doctor.   Begin with a light meal and progress to your normal diet. Heavy or fried foods are harder to digest and may make you feel sick to your stomach (nauseated).   Avoid alcoholic beverages for 24 hours or as instructed.    MEDICATIONS  You may resume your normal medications.   WHAT YOU CAN EXPECT TODAY  Some feelings of bloating in the abdomen.   Passage of more gas than usual.    IF YOU HAD A BIOPSY TAKEN DURING THE UPPER ENDOSCOPY:  Eat a soft diet IF YOU HAVE NAUSEA, BLOATING, ABDOMINAL PAIN, OR VOMITING.    FINDING OUT THE RESULTS OF YOUR TEST Not all test  results are available during your visit. DR. Oneida Alar WILL CALL YOU WITHIN 14 DAYS OF YOUR PROCEDUE WITH YOUR RESULTS. Do not assume everything is normal if you have not heard from DR. Rhonda Vangieson, CALL HER OFFICE AT [phone].  SEEK IMMEDIATE MEDICAL ATTENTION AND CALL THE OFFICE: [phone] IF:  You have more than a spotting of blood in your stool.   Your belly is swollen (abdominal distention).   You are nauseated or vomiting.   You have a temperature over 101F.   You have abdominal pain or discomfort that is severe or gets worse throughout the day.   Gastritis  Gastritis is an inflammation (the body's way of reacting to injury and/or infection) of the stomach. It is often caused by viral or bacterial (germ) infections. It can also be caused BY ASPIRIN, BC/GOODY POWDER'S, (IBUPROFEN) MOTRIN, OR ALEVE (NAPROXEN), chemicals (including alcohol), SPICY FOODS, and medications. This illness may be associated with generalized malaise (feeling tired, not well), UPPER ABDOMINAL STOMACH cramps, and fever. One common bacterial cause of gastritis is an organism known as H. Pylori. This can be treated with antibiotics.    Low-Fat Diet  BREADS, CEREALS, PASTA, RICE, DRIED PEAS, AND BEANS These products are high in carbohydrates and most are low in fat. Therefore, they can be increased in the diet as substitutes for fatty foods. They too, however, contain calories and should not be eaten in excess. Cereals can  be eaten for snacks as well as for breakfast.  Include foods that contain fiber (fruits, vegetables, whole grains, and legumes). Research shows that fiber may lower blood cholesterol levels, especially the water-soluble fiber found in fruits, vegetables, oat products, and legumes.  FRUITS AND VEGETABLES It is good to eat fruits and vegetables. Besides being sources of fiber, both are rich in vitamins and some minerals. They help you get the daily allowances of these nutrients. Fruits and  vegetables can be used for snacks and desserts.  MEATS Limit lean meat, chicken, [place], and fish to no more than 6 ounces per day.  Beef, Pork, and Lamb Use lean cuts of beef, pork, and lamb. Lean cuts include:  Extra-lean ground beef.  Arm roast.  Sirloin tip.  Center-cut ham.  Round steak.  Loin chops.  Rump roast.  Tenderloin.  Trim all fat off the outside of meats before cooking. It is not necessary to severely decrease the intake of red meat, but lean choices should be made. Lean meat is rich in protein and contains a highly absorbable form of iron. Premenopausal women, in particular, should avoid reducing lean red meat because this could increase the risk for low red blood cells (iron-deficiency anemia).  Chicken and [place] These are good sources of protein. The fat of poultry can be reduced by removing the skin and underlying fat layers before cooking. Chicken and [place] can be substituted for lean red meat in the diet. Poultry should not be fried or covered with high-fat sauces.  Fish and Shellfish Fish is a good source of protein. Shellfish contain cholesterol, but they usually are low in saturated fatty acids. The preparation of fish is important. Like chicken and [place], they should not be fried or covered with high-fat sauces.  EGGS Egg whites contain no fat or cholesterol. They can be eaten often. Try 1 to 2 egg whites instead of whole eggs in recipes or use egg substitutes that do not contain yolk.  MILK AND DAIRY PRODUCTS Use skim or 1% milk instead of 2% or whole milk. Decrease whole milk, natural, and processed cheeses. Use nonfat or low-fat (2%) cottage cheese or low-fat cheeses made from vegetable oils. Choose nonfat or low-fat (1 to 2%) yogurt. Experiment with evaporated skim milk in recipes that call for heavy cream. Substitute low-fat yogurt or low-fat cottage cheese for sour cream in dips and salad dressings. Have at least 2 servings of low-fat dairy products, such  as 2 glasses of skim (or 1%) milk each day to help get your daily calcium intake.  FATS AND OILS Reduce the total intake of fats, especially saturated fat. Butterfat, lard, and beef fats are high in saturated fat and cholesterol. These should be avoided as much as possible. Vegetable fats do not contain cholesterol, but certain vegetable fats, such as coconut oil, palm oil, and palm kernel oil are very high in saturated fats. These should be limited. These fats are often used in bakery goods, processed foods, popcorn, oils, and nondairy creamers. Vegetable shortenings and some peanut butters contain hydrogenated oils, which are also saturated fats. Read the labels on these foods and check for saturated vegetable oils.  Unsaturated vegetable oils and fats do not raise blood cholesterol. However, they should be limited because they are fats and are high in calories. Total fat should still be limited to 30% of your daily caloric intake. Desirable liquid vegetable oils are corn oil, cottonseed oil, olive oil, canola oil, safflower oil, soybean oil, and sunflower  oil. Peanut oil is not as good, but small amounts are acceptable. Buy a heart-healthy tub margarine that has no partially hydrogenated oils in the ingredients. Mayonnaise and salad dressings often are made from unsaturated fats, but they should also be limited because of their high calorie and fat content. Seeds, nuts, peanut butter, olives, and avocados are high in fat, but the fat is mainly the unsaturated type. These foods should be limited mainly to avoid excess calories and fat.  OTHER EATING TIPS Snacks  Most sweets should be limited as snacks. They tend to be rich in calories and fats, and their caloric content outweighs their nutritional value. Some good choices in snacks are graham crackers, melba toast, soda crackers, bagels (no egg), English muffins, fruits, and vegetables. These snacks are preferable to snack crackers, [place] fries, and  chips. Popcorn should be air-popped or cooked in small amounts of liquid vegetable oil.  Desserts Eat fruit, low-fat yogurt, and fruit ices instead of pastries, cake, and cookies. Sherbet, angel food cake, gelatin dessert, frozen low-fat yogurt, or other frozen products that do not contain saturated fat (pure fruit juice bars, frozen ice pops) are also acceptable.   COOKING METHODS Choose those methods that use little or no fat. They include: Poaching.  Braising.  Steaming.  Grilling.  Baking.  Stir-frying.  Broiling.  Microwaving.  Foods can be cooked in a nonstick pan without added fat, or use a nonfat cooking spray in regular cookware. Limit fried foods and avoid frying in saturated fat. Add moisture to lean meats by using water, broth, cooking wines, and other nonfat or low-fat sauces along with the cooking methods mentioned above. Soups and stews should be chilled after cooking. The fat that forms on top after a few hours in the refrigerator should be skimmed off. When preparing meals, avoid using excess salt. Salt can contribute to raising blood pressure in some people.  EATING AWAY FROM HOME Order entres, potatoes, and vegetables without sauces or butter. When meat exceeds the size of a deck of cards (3 to 4 ounces), the rest can be taken home for another meal. Choose vegetable or fruit salads and ask for low-calorie salad dressings to be served on the side. Use dressings sparingly. Limit high-fat toppings, such as bacon, crumbled eggs, cheese, sunflower seeds, and olives. Ask for heart-healthy tub margarine instead of butter.

## 2015-06-20 NOTE — Care Management Note (Signed)
Case Management Note  Patient Details  Name: ABDULAI BLAYLOCK MRN: [id number] Date of Birth: 07-Oct-1940  Subjective/Objective:                    Action/Plan:   Expected Discharge Date:  06/17/15               Expected Discharge Plan:  Home/Self Care  In-House Referral:  NA  Discharge planning Services  CM Consult  Post Acute Care Choice:  NA Choice offered to:  NA  DME Arranged:    DME Agency:     HH Arranged:    HH Agency:     Status of Service:  In process, will continue to follow  Medicare Important Message Given:  Yes-second notification given Date Medicare IM Given:    Medicare IM give by:    Date Additional Medicare IM Given:    Additional Medicare Important Message give by:     If discussed at [place] of Stay Meetings, dates discussed: 06/20/2015   Additional Comments: Pt changed to OBS today. EGD completed today. Pt transferred to SDU after procedure due to cardiac irregularities and CP. Will cont to follow.  Sherald Barge, RN 06/20/2015, 2:59 PM

## 2015-06-20 NOTE — Progress Notes (Addendum)
PROGRESS NOTE  Tanner Pena QMV:[id number] DOB: Oct 04, 1940 DOA: 06/14/2015 PCP: [name], MD  Summary: 68 yom PMH of CAD with multiple stents (on Plavix) and mechanical AVR (on Coumadin) presented with melena. Patient had a recent hospitalization(10/11-10/14/2016) for GI bleeding and underwent a GI workup. He had to be transfused 3 units due to a hemoglobin level of 5.5. Upon discharge on 06/02/2015 his hemoglobin was 8.1. He had an outpatient capsule endoscopy which revealed an ulcer in the small bowel. He was instructed to stop Plavix by discharging physician, GI and Dr. Earleen Newport but continued this medication.    He was admitted 10/26 for GIB, transfused PRBC and Hgb has remained stable. Prolonged hospitalization waiting for INR to drift naturally <1.8. He underwent EGD today and developed SVT and then chest pain therafter and was transferred to SDU. Ongoing pain, started on NTG infusion and treated with morphine. 1st troponin minimally elevated. Seen by cardiology, plan trend troponin. If goes up ok to give ASA (patient declined admin of ASA at this point). If rules in for ACS would need to carefully discuss risk/benefit with patient as he is likely to bleed on heparin.  Assessment/Plan: 1. Upper GI bleed secondary to moderate to severe gastritis. Complicated by warfarin and Plavix on admission. INR is acceptable today, underwent EGD 11/1, perioperatively bradycardic, then tachycardic. Post procedure with SVT, chest pain. 2. Chest pain. Concerning for angina. EKG shows bundle branch block no definite acute changes are seen. Lab works pending at this time. 3. ABLA. Remains stable s/p transfusion of 2U PRBCs.  Hemoglobin improved today. 4. 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 Warfarin on hold, no need for heparin bridge per cardiology.  5. CAD with multiple stents. 6. PVD s/p SMA stent 02/2015, last seen by Dr. Earleen Newport 10/18, per documentation recommended against antiplatelet  agent.  7. AKI, resolved.  8. DM type 2, stable. Will continue SSI. 9. COPD, no evidence of exacerbation.  10. Chronic systolic CHF, continues to appear compensated. 11. Essential HTN, continue home meds and monitor. Appears stable.    Transferred to SDU post procedure with chest pain, concern for angina  Plan morphine, ASA, f/u troponin  Trend troponin, will ask cardiology for reevaluation and recs  Discussed with Dr. Manfred Arch for ASA, relatively low risk. Heparin (if rules for ACS) would place at high risk for bleeding but could be pursued in pt/family accept risk.  Code Status: Full DVT prophylaxis: SCDs Family Communication: Discussed with patient who understands and has no concerns at this time. Disposition Plan:Anticipate discharge within 1 day.   [name], MD  [facility]  Pager [phone] If 7PM-7AM, please contact night-coverage at [email or web address], password [facility] 06/20/2015, 3:01 PM  LOS: 6 days   Consultants:  GI  Cardiology   Procedures:  Transfused 2U PRBCs  Antibiotics:    HPI/Subjective: Felt well this AM. Periop per Dr. Patsey Berthold bradycardia treated with atropine then had SVT during/post procedure treated with IV metoprolol. Developed CP post-procedure treated with NTG; then had recurrence again NTG and sent to SDU. Currently has 8/10 chest pain radiating to left arm. No n/v. No diaphoresis, no SOB.  Objective: Filed Vitals:   06/20/15 1330 06/20/15 1345 06/20/15 1400 06/20/15 1422  BP: 159/82 169/82 147/78 174/81  Pulse: 92 91 97 106  Temp:  97 F (36.1 C)  97 F (36.1 C)  TempSrc:      Resp: '18 20 21 25  '$ Height:      Weight:      SpO2:  100% 100% 100% 100%    Intake/Output Summary (Last 24 hours) at 06/20/15 1501 Last data filed at 06/20/15 1348  Gross per 24 hour  Intake   1040 ml  Output    500 ml  Net    540 ml     Filed Weights   06/14/15 2151 06/15/15 0500 06/16/15 0500  Weight: 91.4 kg (201 lb 8 oz) 91 kg (200 lb 9.9  oz) 90.9 kg (200 lb 6.4 oz)    Exam:  VSS, afebrile, not hypoxic General:  Appears calm and comfortable Eyes: PERRL, normal lids, irises & conjunctiva ENT: grossly normal hearing, lips & tongue Cardiovascular: tachycardic, reg irregular, no m/r/g. No LE edema. Telemetry: SR with bigeminy Respiratory: CTA bilaterally, no w/r/r. Normal respiratory effort. Abdomen: soft, ntnd Skin: no rash or induration seen  Musculoskeletal: grossly normal tone BUE/BLE Psychiatric: grossly normal mood and affect, speech fluent and appropriate Neurologic: grossly non-focal.  New data reviewed:  Hgb 9.4  EKG ST bigeminy LBBB (LBBB old)  Scheduled Meds: . sodium chloride   Intravenous Once  . allopurinol  300 mg Oral Daily  . cholecalciferol  2,000 Units Oral Daily  . insulin aspart  0-9 Units Subcutaneous TID WC  . isosorbide mononitrate  30 mg Oral Daily  .  morphine injection  4 mg Intravenous Once  . omega-3 acid ethyl esters  1 g Oral BID  . pantoprazole  40 mg Oral BID AC  . potassium chloride SA  20 mEq Oral Daily  . rosuvastatin  20 mg Oral Daily   Continuous Infusions:    Principal Problem:   GI bleed Active Problems:   H/O aortic valve replacement- St Jude   PVD prior SFA PTA with chronic LE disease, not amenable to PTA   DM2 (diabetes mellitus, type 2) (HCC)   Hyperlipidemia with target LDL less than 70   COPD (chronic obstructive pulmonary disease) (HCC)   Chronic anticoagulation   Acute blood loss anemia   Chronic systolic heart failure (HCC)   Coronary artery disease involving native coronary artery of native heart with angina pectoris ([place])   Essential hypertension   History of coronary artery stent placement   Anticoagulation management encounter   Bleeding gastrointestinal   Melena   Absolute anemia   Time spent: 35 minutes   By signing my name below, I, Rennis Harding attest that this documentation has been prepared under the direction and in the presence  of [name], MD Electronically signed: Rennis Harding  06/20/2015 2:31pm  I personally performed the services described in this documentation. All medical record entries made by the scribe were at my direction. I have reviewed the chart and agree that the record reflects my personal performance and is accurate and complete. [name], MD

## 2015-06-20 NOTE — Progress Notes (Signed)
Trop 0.51 called to midlevel.

## 2015-06-20 NOTE — Progress Notes (Signed)
INR now 1.7. Appropriate for EGD. Will proceed with Propofol with Dr. Oneida Alar.

## 2015-06-20 NOTE — Anesthesia Postprocedure Evaluation (Signed)
  Anesthesia Post-op Note  Patient: Tanner Pena  Procedure(s) Performed: Procedure(s): ESOPHAGOGASTRODUODENOSCOPY (EGD) WITH PROPOFOL (N/A) BIOPSY (N/A)  Patient Location: PACU  Anesthesia Type:MAC  Level of Consciousness: awake, alert , oriented and patient cooperative  Airway and Oxygen Therapy: Patient Spontanous Breathing and Patient connected to nasal cannula oxygen  Post-op Pain: none  Post-op Assessment: Post-op Vital signs reviewed, Patient's Cardiovascular Status Stable, Respiratory Function Stable, Patent Airway, No signs of Nausea or vomiting and Pain level controlled              Post-op Vital Signs: Reviewed and stable  Last Vitals:  Filed Vitals:   06/20/15 1219  BP:   Pulse: 93  Temp: 36.6 C  Resp:     Complications: No apparent anesthesia complications

## 2015-06-20 NOTE — Anesthesia Procedure Notes (Signed)
Procedure Name: MAC Date/Time: 06/20/2015 11:40 AM Performed by: Andree Elk, AMY A Pre-anesthesia Checklist: Patient identified, Timeout performed, Emergency Drugs available, Suction available and Patient being monitored Patient Re-evaluated:Patient Re-evaluated prior to inductionOxygen Delivery Method: Simple face mask

## 2015-06-20 NOTE — Anesthesia Preprocedure Evaluation (Signed)
Anesthesia Evaluation  Patient identified by MRN, date of birth, ID band Patient awake    Reviewed: Allergy & Precautions, NPO status , Patient's Chart, lab work & pertinent test results  History of Anesthesia Complications Negative for: history of anesthetic complications  Airway Mallampati: II  TM Distance: >3 FB Neck ROM: Full    Dental no notable dental hx. (+) Edentulous Upper, Edentulous Lower, Dental Advisory Given   Pulmonary shortness of breath and with exertion, sleep apnea , pneumonia, COPD,  COPD inhaler, former smoker,    Pulmonary exam normal breath sounds clear to auscultation       Cardiovascular hypertension, Pt. on medications and Pt. on home beta blockers + angina + CAD, + Past MI, + Cardiac Stents, + CABG, + Peripheral Vascular Disease and +CHF  Normal cardiovascular exam+ dysrhythmias + Valvular Problems/Murmurs ( S/P aortic valve replacement) AS  Rhythm:Regular Rate:Normal     Neuro/Psych negative neurological ROS  negative psych ROS   GI/Hepatic Neg liver ROS, GERD  Medicated and Controlled,  Endo/Other  diabetes, Type 2, Oral Hypoglycemic Agents  Renal/GU Renal disease     Musculoskeletal  (+) Arthritis ,   Abdominal   Peds  Hematology negative hematology ROS (+) anemia ,   Anesthesia Other Findings   Reproductive/Obstetrics negative OB ROS                             Anesthesia Physical Anesthesia Plan  ASA: IV  Anesthesia Plan: MAC   Post-op Pain Management:    Induction: Intravenous  Airway Management Planned: Simple Face Mask  Additional Equipment:   Intra-op Plan:   Post-operative Plan:   Informed Consent: I have reviewed the patients History and Physical, chart, labs and discussed the procedure including the risks, benefits and alternatives for the proposed anesthesia with the patient or authorized representative who has indicated his/her  understanding and acceptance.     Plan Discussed with:   Anesthesia Plan Comments:         Anesthesia Quick Evaluation

## 2015-06-20 NOTE — Progress Notes (Signed)
RN paged this NP with troponin of 0.51. Was 0.05 on admission. Pt here with GIB and is s/p endoscopy today with findings of severe gastritis. Pt has received multiple blood transfusions since admit. Pt is having NO CHEST PAIN. Therefore, will not start ASA/Heparin tonight. Coumadin is supposed to be restarted in am (pt has stents and a mechanical valve). EKG with BBB and per cardiology, difficult to discern ischemia. Discussed with Dr. Shanon Brow of [facility]. Given GIB, will hold on any anticoagulation tonight and have GI and cardio decide in am. Check H/H now to verify stability. Plan discussed with RN as well.  Clance Boll, NP [facility]

## 2015-06-20 NOTE — H&P (Addendum)
Primary Care Physician:  [name], MD Primary Gastroenterologist:  Dr. Oneida Alar  Pre-Procedure History & Physical: HPI:  Tanner Pena is a 74 y.o. male here for MELENA/POSSIBLE ULCER ON GIVENS OCT 27.  Past Medical History  Diagnosis Date  . Type 2 diabetes mellitus ([place]) 2007  . Essential hypertension   . Gout   . Hypercholesteremia   . Peripheral vascular disease ([place])     Lower extremity PCI/stenting  . COPD (chronic obstructive pulmonary disease) ([place])   . S/P aortic valve replacement 1990    a. St. Jude  . Chronic back pain   . Dysphagia   . Neuromuscular disorder ([place])   . Peripheral neuropathy ([place])   . Critical lower limb ischemia   . CAD (coronary artery disease)     a. 05/13/14 [name] s/p overlapping DESx2 to SVG to RCA. b.  s/p CABG '90 with redo '94 & stent to RCA SVG in 2005  . Chronic toe ulcer ([place])     a. Left foot  . Chronic systolic heart failure ([place])   . History of kidney stones   . GERD (gastroesophageal reflux disease)   . Arthritis   . Sleep apnea   . Myocardial infarction ([place])   . GI bleeding 05/31/2015    Source not identified.  . Acute blood loss anemia 02/2015 & 05/2015    Hemoglobin 5.5 on 05/31/15; status post transfusion.    Past Surgical History  Procedure Laterality Date  . Aortic valve replacement  1990    St. Jude  . Rotator cuff repair      right  . Cataract extraction      bilateral  . Coronary stent placement  2005    RCA vein graft A 3.0x13.0 TAXUS stent was then placed int he vessel a Viva 3.0x4.0 (perfusion balloon was made ready it was placed through the entire lenght of the stent  . Peripheral vascular procedures lower extremities      right external iliac  artery PTA and stenting as well as bilateral SFA intervention remotely. Repeat procedures in 2011 bilaterally  . Coronary artery bypass graft  1994    6 vessels  . [name]  06/13/2011    Procedure: Venia Minks DILATION;  Surgeon: [name], MD;   Location: [facility];  Service: Endoscopy;  Laterality: N/A;  Dilated to 56.   . Angioplasty illiac artery    . Back surgery  [id number]    2  . Eye surgery    . Amputation Left 07/13/2014    Procedure: Transmetatarsal Amputation;  Surgeon: [name], MD;  Location: [facility];  Service: Orthopedics;  Laterality: Left;  . Lower extremity angiogram N/A 02/15/2013    Procedure: LOWER EXTREMITY ANGIOGRAM;  Surgeon: [name], MD;  Location: [facility] CATH LAB;  Service: Cardiovascular;  Laterality: N/A;  . Left heart catheterization with coronary angiogram N/A 05/11/2014    Procedure: LEFT HEART CATHETERIZATION WITH CORONARY ANGIOGRAM;  Surgeon: [name], MD;  Location: [facility] CATH LAB;  Service: Cardiovascular;  Laterality: N/A;  . Percutaneous coronary stent intervention (pci-s) N/A 05/13/2014    Procedure: PERCUTANEOUS CORONARY STENT INTERVENTION (PCI-S);  Surgeon: [name], MD;  Location: [facility] CATH LAB;  Service: Cardiovascular;  Laterality: N/A;  . Lower extremity angiogram N/A 06/06/2014    Procedure: LOWER EXTREMITY ANGIOGRAM;  Surgeon: [name], MD;  Location: [facility] CATH LAB;  Service: Cardiovascular;  Laterality: N/A;  . Amputation Left 08/20/2014    Procedure: Revision Transmetatarsal  Amputation versus Below Knee Amputation;  Surgeon: [name], MD;  Location: [facility] [facility];  Service: Orthopedics;  Laterality: Left;  . Stump revision Left 09/23/2014    Procedure: Revision Left Below Knee Amputation;  Surgeon: [name], MD;  Location: [facility];  Service: Orthopedics;  Laterality: Left;  . Stump revision Left 10/13/2014    Procedure: REVISION LEFT BELOW KNEE AMPUTATION STUMP;  Surgeon: [name], MD;  Location: [facility];  Service: Orthopedics;  Laterality: Left;  . Esophagogastroduodenoscopy N/A 02/09/2015    DR. Schooler: Normal EGD  . Esophagogastroduodenoscopy  05/2011    Dr. [name]: s/p esophageal dilation, antral erosions/nodularity with benign biopsies  .  Colonoscopy  05/2011    Dr. [name]: benign rectal polyp, left sided tics, ascending colonic ulcers (path c/w ischemia)  . Colonoscopy with propofol N/A 06/01/2015    RMR: normal appearing rectal mucosa. Scattered left-sided diverticula. the remainder of the colonic mucosa appeared normal. the distal 5 cm of terminal ileal mucosa also appeared normal. retroflexion was performed.   Freda Munro capsule study N/A 06/08/2015    Procedure: GIVENS CAPSULE STUDY;  Surgeon: [name], MD;  Location: [facility];  Service: Endoscopy;  Laterality: N/A;  0700     Prior to Admission medications   Medication Sig Start Date End Date Taking? Authorizing Provider  albuterol (PROVENTIL) 4 MG tablet Take 4 mg by mouth 3 (three) times daily. 01/20/15  Yes Historical Provider, MD  albuterol-ipratropium (COMBIVENT) 18-103 MCG/ACT inhaler Inhale 1 puff into the lungs 4 (four) times daily. Coughing/ Shortness of Breath   Yes Historical Provider, MD  allopurinol (ZYLOPRIM) 300 MG tablet Take 300 mg by mouth daily.   Yes Historical Provider, MD  cholecalciferol (VITAMIN D) 1000 UNITS tablet Take 2,000 Units by mouth daily.   Yes Historical Provider, MD  clopidogrel (PLAVIX) 75 MG tablet Take 75 mg by mouth daily.   Yes Historical Provider, MD  Emollient (EUCERIN) lotion Apply 10 mLs topically as needed for dry skin.   Yes Historical Provider, MD  fenofibrate (TRICOR) 145 MG tablet Take 1 tablet (145 mg total) by mouth daily. NEED OV. 05/19/15  Yes [name], MD  furosemide (LASIX) 40 MG tablet Take 1 tablet (40 mg total) by mouth daily. RESTART ON SATURDAY, 09/03/14. Patient taking differently: Take 80 mg by mouth daily.  06/02/15  Yes Rexene Alberts, MD  glimepiride (AMARYL) 1 MG tablet Take 1 mg by mouth 2 (two) times daily.   Yes Historical Provider, MD  isosorbide mononitrate (IMDUR) 30 MG 24 hr tablet Take 1 tablet (30 mg total) by mouth daily. <PLEASE MAKE APPOINTMENT FOR REFILLS> Patient taking differently:  Take 30 mg by mouth daily.  05/19/15  Yes [name], MD  metoprolol succinate (TOPROL XL) 50 MG 24 hr tablet Take 1 tablet (50 mg total) by mouth daily. RESTART ON Saturday 06/04/15. Take with or immediately following a meal. Patient taking differently: Take 50 mg by mouth daily. Take with or immediately following a meal. 06/02/15  Yes Rexene Alberts, MD  nitroGLYCERIN (NITROSTAT) 0.4 MG SL tablet Place 1 tablet (0.4 mg total) under the tongue every 5 (five) minutes as needed for chest pain. 05/17/14  Yes Eileen Stanford, PA-C  oxyCODONE-acetaminophen (PERCOCET) 10-325 MG per tablet Take 1 tablet by mouth every 4 (four) hours as needed for pain. Patient taking differently: Take 1 tablet by mouth every 3 (three) hours as needed for pain.  09/23/14  Yes [name], MD  potassium chloride SA (K-DUR,KLOR-CON) 20 MEQ tablet Take 20 mEq by mouth daily.     Yes Historical Provider, MD  pregabalin (LYRICA) 50 MG capsule Take one capsule by mouth twice daily for pains Patient taking differently: Take 50 mg by mouth 3 (three) times daily.  08/23/14  Yes Mahima Pandey, MD  ramipril (ALTACE) 2.5 MG capsule Take 1 capsule (2.5 mg total) by mouth 2 (two) times daily. Patient taking differently: Take 2.5-5 mg by mouth 2 (two) times daily. Takes '5mg'$  capsule daily in the morning and takes 2.'5mg'$  capsule at bedtime 06/02/15  Yes Rexene Alberts, MD  rosuvastatin (CRESTOR) 20 MG tablet TAKE (1) TABLET BY MOUTH AT BEDTIME FOR CHOLESTEROL. 05/19/15  Yes [name], MD  sitaGLIPtin (JANUVIA) 50 MG tablet Take 50 mg by mouth daily.   Yes Historical Provider, MD  vitamin C (ASCORBIC ACID) 500 MG tablet Take 500 mg by mouth daily.   Yes Historical Provider, MD  warfarin (COUMADIN) 5 MG tablet Take 1 tablet by mouth daily or as directed by coumadin clinic. Patient taking differently: Take 2.5-5 mg by mouth. 2.'5mg'$  on Mondays and Fridays only. Takes '5mg'$  on all other day 06/02/15  Yes Rexene Alberts, MD  ferrous sulfate  325 (65 FE) MG EC tablet Take 1 tablet (325 mg total) by mouth daily with breakfast. Patient not taking: Reported on 06/08/2015 06/02/15   Rexene Alberts, MD  fish oil-omega-3 fatty acids 1000 MG capsule Take 1 capsule (1 g total) by mouth 2 (two) times daily. Patient not taking: Reported on 06/15/2015 01/22/13   Rockne Menghini, RPH-CPP    Allergies as of 06/14/2015  . (No Known Allergies)    Family History  Problem Relation Age of Onset  . Colon cancer Neg Hx   . Liver disease Neg Hx     Social History   Social History  . Marital Status: Legally Separated    Spouse Name: N/A  . Number of Children: 5  . Years of Education: N/A   Occupational History  . retired     Stage manager   Social History Main Topics  . Smoking status: Former Smoker -- 1.00 packs/day for 55 years    Types: Cigarettes    Start date: 08/19/1953    Quit date: 05/07/2014  . Smokeless tobacco: Current User    Types: Chew    Last Attempt to Quit: 08/20/1995     Comment: Has quit on 3 occasions. Counseling given today 5-10 minutes   I am more than likely going to quit "  . Alcohol Use: No     Comment: Socially, sometimes 12 ounce beer daily, may go month without/no whiskey  . Drug Use: No  . Sexual Activity: Not Currently   Other Topics Concern  . Not on file   Social History Narrative   Has 3 daughters   Has 2 sons    Review of Systems: See HPI, otherwise negative ROS   Physical Exam: BP 157/77 mmHg  Pulse 88  Temp(Src) 97.6 F (36.4 C) (Oral)  Resp 19  Ht '5\' 10"'$  (1.778 m)  Wt 200 lb 6.4 oz (90.9 kg)  BMI 28.75 kg/m2  SpO2 100% General:   Alert,  pleasant and cooperative in NAD Head:  Normocephalic and atraumatic. Neck:  Supple; Lungs:  END EXPIRATORY WHEEZES BILATERALLY.    Heart:  Regular rate and rhythm. Abdomen:  Soft, nontender and nondistended. Normal bowel sounds, without guarding, and without rebound.   Neurologic:  Alert and  oriented  x4;  grossly normal  neurologically.  Impression/Plan:     MELENA/POSSIBLE ULCER ON GIVENS OCT 27.  PLAN: 1. EGD TODAY

## 2015-06-20 NOTE — Op Note (Addendum)
[facility] [address] [place], [address]   ENDOSCOPY PROCEDURE REPORT  PATIENT: Tanner, Pena  MR#: [id number] BIRTHDATE: 1940/10/07 , 14  yrs. old GENDER: male  ENDOSCOPIST: Danie Binder, MD REFERRED BP:PHKFE Gerarda Fraction, M.D.  Garfield Cornea, M.D.  PROCEDURE DATE: June 24, 2015 PROCEDURE:   EGD w/ biopsy  INDICATIONS:melena. ON PLAVIX AND COUMADIN. GIVENS OCT 27 SHOWED FRESH BLOOD IN THE ANTRUM. NO TAKING BETA BLOCKER WHILE INPT. MEDICATIONS: Monitored anesthesia care TOPICAL ANESTHETIC:   Viscous Xylocaine ASA CLASS:  DESCRIPTION OF PROCEDURE:     Physical exam was performed.  Informed consent was obtained from the patient after explaining the benefits, risks, and alternatives to the procedure.  The patient was connected to the monitor and placed in the left lateral position.  Continuous oxygen was provided by nasal cannula and IV medicine administered through an indwelling cannula.  After administration of sedation, the patients esophagus was intubated and the     endoscope was advanced under direct visualization to the second portion of the duodenum.  The scope was removed slowly by carefully examining the color, texture, anatomy, and integrity of the mucosa on the way out.  The patient was recovered in endoscopy and discharged home in satisfactory condition.  Estimated blood loss is zero unless otherwise noted in this procedure report.     ESOPHAGUS: The mucosa of the esophagus appeared normal.   STOMACH: DIFFUSE ERYTHEMA/EROSIONS ALONG THE GREATER CURVATURE STARTING AT THE FUNDUS AND EXTENDING TO THE ANTRUM.  EDEMA/ERYTHEMA/EROSION SEEN ON ANGULARIS.  MULTIPLE COLD FORCEPS BIOPSIES OBTAINED. DUODENUM: The duodenal mucosa showed no abnormalities in the bulb and 2nd part of the duodenum.  COMPLICATIONS: During EGD PT DEVELOPED BRADYCARDIA REQUIRING ATROPINE x 2. IN PACU PT DEVELOPED CHEST PAIN WHEN HR > 100. TRANSFERRED TO ICU. CARDIOLOGY  CONSULTED.  ENDOSCOPIC IMPRESSION: 1.   UGI BLEED TO TO MODERATE TO SEVERE GASTRITIS  RECOMMENDATIONS: DISCUSSED WITH DR.  BARRY.  STOP THE PLAVIX.  RE-START COUMADIN ON NOV 2. START PROTONIX 30 MINS PRIOR TO MEALS TWICE DAILY. AVOID TRIGGERS FOR GASTRITIS. FOLLOW UP IN 3 MOS WITH DR.  [name].  REPEAT EXAM: eSigned:  Danie Binder, MD 06/24/15 9:40 PMRevised: Jun 24, 2015 9:40 PM  CPT CODES: ICD CODES:  The ICD and CPT codes recommended by this software are interpretations from the data that the clinical staff has captured with the software.  The verification of the translation of this report to the ICD and CPT codes and modifiers is the sole responsibility of the health care institution and practicing physician where this report was generated.  [employer] will not be held responsible for the validity of the ICD and CPT codes included on this report.  AMA assumes no liability for data contained or not contained herein. CPT is a Designer, television/film set of the [employer].

## 2015-06-21 ENCOUNTER — Observation Stay (HOSPITAL_COMMUNITY): Payer: Medicare Other

## 2015-06-21 ENCOUNTER — Other Ambulatory Visit: Payer: Self-pay | Admitting: Physician Assistant

## 2015-06-21 ENCOUNTER — Encounter (HOSPITAL_COMMUNITY): Payer: Self-pay | Admitting: Gastroenterology

## 2015-06-21 ENCOUNTER — Other Ambulatory Visit: Payer: Self-pay | Admitting: Cardiovascular Disease

## 2015-06-21 DIAGNOSIS — I214 Non-ST elevation (NSTEMI) myocardial infarction: Secondary | ICD-10-CM

## 2015-06-21 DIAGNOSIS — K2901 Acute gastritis with bleeding: Secondary | ICD-10-CM

## 2015-06-21 DIAGNOSIS — R079 Chest pain, unspecified: Secondary | ICD-10-CM

## 2015-06-21 LAB — CBC
HCT: 26.4 % — ABNORMAL LOW (ref 39.0–52.0)
HEMATOCRIT: 28.5 % — AB (ref 39.0–52.0)
HEMOGLOBIN: 8.5 g/dL — AB (ref 13.0–17.0)
Hemoglobin: 8.1 g/dL — ABNORMAL LOW (ref 13.0–17.0)
MCH: 24.8 pg — AB (ref 26.0–34.0)
MCH: 24.4 pg — AB (ref 26.0–34.0)
MCHC: 30.7 g/dL (ref 30.0–36.0)
MCHC: 29.8 g/dL — ABNORMAL LOW (ref 30.0–36.0)
MCV: 81 fL (ref 78.0–100.0)
MCV: 81.7 fL (ref 78.0–100.0)
PLATELETS: 250 10*3/uL (ref 150–400)
Platelets: 229 10*3/uL (ref 150–400)
RBC: 3.26 MIL/uL — AB (ref 4.22–5.81)
RBC: 3.49 MIL/uL — AB (ref 4.22–5.81)
RDW: 19.9 % — ABNORMAL HIGH (ref 11.5–15.5)
RDW: 20.2 % — ABNORMAL HIGH (ref 11.5–15.5)
WBC: 7.8 10*3/uL (ref 4.0–10.5)
WBC: 8.1 10*3/uL (ref 4.0–10.5)

## 2015-06-21 LAB — PREPARE RBC (CROSSMATCH)

## 2015-06-21 LAB — GLUCOSE, CAPILLARY
GLUCOSE-CAPILLARY: 190 mg/dL — AB (ref 65–99)
GLUCOSE-CAPILLARY: 127 mg/dL — AB (ref 65–99)
Glucose-Capillary: 132 mg/dL — ABNORMAL HIGH (ref 65–99)
Glucose-Capillary: 136 mg/dL — ABNORMAL HIGH (ref 65–99)

## 2015-06-21 LAB — PROTIME-INR
INR: 1.4 (ref 0.00–1.49)
PROTHROMBIN TIME: 17.2 s — AB (ref 11.6–15.2)

## 2015-06-21 LAB — TROPONIN I
TROPONIN I: 2.26 ng/mL — AB (ref <0.031)
Troponin I: 2.16 ng/mL (ref <0.031)
Troponin I: 2.37 ng/mL (ref <0.031)

## 2015-06-21 LAB — HEPARIN LEVEL (UNFRACTIONATED): HEPARIN UNFRACTIONATED: 0.15 [IU]/mL — AB (ref 0.30–0.70)

## 2015-06-21 MED ORDER — INSULIN DETEMIR 100 UNIT/ML ~~LOC~~ SOLN
10.0000 [IU] | Freq: Every day | SUBCUTANEOUS | Status: DC
  Administered 2015-06-21 – 2015-06-22 (×2): 10 [IU] via SUBCUTANEOUS
  Filled 2015-06-21 (×5): qty 0.1

## 2015-06-21 MED ORDER — HEPARIN BOLUS VIA INFUSION
1000.0000 [IU] | Freq: Once | INTRAVENOUS | Status: AC
  Administered 2015-06-21: 1000 [IU] via INTRAVENOUS
  Filled 2015-06-21: qty 1000

## 2015-06-21 MED ORDER — ACETAMINOPHEN 325 MG PO TABS
650.0000 mg | ORAL_TABLET | Freq: Once | ORAL | Status: AC
  Administered 2015-06-21: 650 mg via ORAL
  Filled 2015-06-21: qty 2

## 2015-06-21 MED ORDER — METOPROLOL SUCCINATE ER 25 MG PO TB24
25.0000 mg | ORAL_TABLET | Freq: Two times a day (BID) | ORAL | Status: DC
  Administered 2015-06-21 – 2015-06-22 (×3): 25 mg via ORAL
  Filled 2015-06-21 (×4): qty 1

## 2015-06-21 MED ORDER — WARFARIN SODIUM 2.5 MG PO TABS
2.5000 mg | ORAL_TABLET | Freq: Once | ORAL | Status: AC
  Administered 2015-06-21: 2.5 mg via ORAL
  Filled 2015-06-21: qty 1

## 2015-06-21 MED ORDER — INSULIN ASPART 100 UNIT/ML ~~LOC~~ SOLN: 0.0000 [IU] | Freq: Every day | SUBCUTANEOUS | Status: DC

## 2015-06-21 MED ORDER — WARFARIN SODIUM 5 MG PO TABS
5.0000 mg | ORAL_TABLET | Freq: Once | ORAL | Status: AC
  Administered 2015-06-21: 5 mg via ORAL
  Filled 2015-06-21: qty 1

## 2015-06-21 MED ORDER — SODIUM CHLORIDE 0.9 % IV SOLN: Freq: Once | INTRAVENOUS | Status: AC

## 2015-06-21 MED ORDER — DIPHENHYDRAMINE HCL 25 MG PO CAPS
25.0000 mg | ORAL_CAPSULE | Freq: Once | ORAL | Status: AC
  Administered 2015-06-21: 25 mg via ORAL
  Filled 2015-06-21: qty 1

## 2015-06-21 MED ORDER — CLOPIDOGREL BISULFATE 75 MG PO TABS
75.0000 mg | ORAL_TABLET | Freq: Once | ORAL | Status: AC
  Administered 2015-06-21: 75 mg via ORAL
  Filled 2015-06-21: qty 1

## 2015-06-21 MED ORDER — INSULIN ASPART 100 UNIT/ML ~~LOC~~ SOLN
0.0000 [IU] | Freq: Three times a day (TID) | SUBCUTANEOUS | Status: DC
  Administered 2015-06-21 – 2015-06-22 (×4): 2 [IU] via SUBCUTANEOUS
  Administered 2015-06-22: 3 [IU] via SUBCUTANEOUS
  Administered 2015-06-23: 2 [IU] via SUBCUTANEOUS
  Administered 2015-06-23: 3 [IU] via SUBCUTANEOUS

## 2015-06-21 MED ORDER — HEPARIN (PORCINE) IN NACL 100-0.45 UNIT/ML-% IJ SOLN
1250.0000 [IU]/h | INTRAMUSCULAR | Status: DC
  Administered 2015-06-21: 1100 [IU]/h via INTRAVENOUS
  Administered 2015-06-22 – 2015-06-23 (×2): 1350 [IU]/h via INTRAVENOUS
  Filled 2015-06-21 (×3): qty 250

## 2015-06-21 MED ORDER — WARFARIN - PHARMACIST DOSING INPATIENT
Status: DC
  Administered 2015-06-21 – 2015-06-22 (×2)

## 2015-06-21 MED ORDER — INSULIN ASPART 100 UNIT/ML ~~LOC~~ SOLN
4.0000 [IU] | Freq: Three times a day (TID) | SUBCUTANEOUS | Status: DC
  Administered 2015-06-21 – 2015-06-23 (×7): 4 [IU] via SUBCUTANEOUS

## 2015-06-21 MED ORDER — PERFLUTREN LIPID MICROSPHERE
1.0000 mL | INTRAVENOUS | Status: AC | PRN
  Administered 2015-06-21: 1 mL via INTRAVENOUS
  Administered 2015-06-21: 2 mL via INTRAVENOUS
  Filled 2015-06-21: qty 10

## 2015-06-21 NOTE — Progress Notes (Signed)
Subjective:  Chest pain finally eased last night with IV NTG. Feels better today  Objective:  Vital Signs in the last 24 hours: Temp:  [97 F (36.1 C)-97.9 F (36.6 C)] 97.7 F (36.5 C) (11/02 0400) Pulse Rate:  [42-131] 69 (11/02 0745) Resp:  [12-30] 15 (11/02 0215) BP: (106-181)/(48-158) 121/48 mmHg (11/02 0745) SpO2:  [93 %-100 %] 100 % (11/02 0215) Weight:  [195 lb 8.8 oz (88.7 kg)] 195 lb 8.8 oz (88.7 kg) (11/02 0500)  Intake/Output from previous day: 11/01 0701 - 11/02 0700 In: 1123.7 [I.V.:1123.7] Out: -  Intake/Output from this shift:    Physical Exam: NECK: bilateral carotid bruitsWithout JVD, HJR LUNGS: Clear anterior, posterior, lateral HEART: Regular rate and rhythm with skipping 2/6 systolic murmur LSB, crisp valve, no gallop, rub, bruit, thrill, or heave EXTREMITIES: right Without cyanosis, clubbing, or edema, left AKA   Lab Results:  Recent Labs  06/20/15 1430 06/20/15 2237 06/21/15 0209  WBC 6.4  --  7.8  HGB 9.4* 8.3* 8.1*  PLT 247  --  250    Recent Labs  06/20/15 1430  NA 140  K 4.1  CL 109  CO2 25  GLUCOSE 168*  BUN 13  CREATININE 1.08    Recent Labs  06/20/15 2005 06/21/15 0209  TROPONINI 0.51* 2.16*   Hepatic Function Panel No results for input(s): PROT, ALBUMIN, AST, ALT, ALKPHOS, BILITOT, BILIDIR, IBILI in the last 72 hours. No results for input(s): CHOL in the last 72 hours. No results for input(s): PROTIME in the last 72 hours.   Cardiac Studies:  Telemetry: bigeminy, frequent PVC's 04/2014: ANGIOGRAPHIC DATA:     The SVG to right coronary artery is heavily diseased and full of debris in the proximal portion.  See above for PCI NARRATIVE:   IMPRESSIONS:    1. Successful PCI of the  SVG to right coronary artery.  A 2.75 x 15, 2.75 x 23 and 2.7 x 28 were placed in overlapping fashion from the proximal to mid graft. The entire stented segment was post dilated up to 3.6 mm in diameter. Distal embolic protection was used  with successful trapping of debris.   RECOMMENDATION:  We'll start aspirin and Plavix at this point. Once his INR is therapeutic, I would stop the aspirin and leave him on Coumadin and Plavix.  Continue aggressive medical therapy. He needs aggressive secondary prevention.  Followup with Dr. [name].     2Decho 12/2014: Study Conclusions  - Left ventricle: Inferior, septal and apical hypokinesis. The   cavity size was mildly dilated. Wall thickness was increased in a   pattern of moderate LVH. The estimated ejection fraction was 40%. - Aortic valve: Mechanical AVR with no periporsthetic   regurgitation. Gradients elevated but actually lower than 2015   when peak velocity was 3.5 m/sec and peak gradient was 49 mmHg.   Valve area (VTI): 0.88 cm^2. Valve area (Vmax): 0.63 cm^2. Valve   area (Vmean): 0.94 cm^2. - Left atrium: The atrium was moderately dilated. - Atrial septum: There was increased thickness of the septum,   consistent with lipomatous hypertrophy.   Assessment/Plan:  NSTEMI: Chest pain in endoscopy. Positive troponins up to 2.16 this morning chronic left bundle Sohaib Vereen block-spoke with GI-ok to use heparin if needed. Will discuss possible transfer and cath with Dr. Harl Bowie since off Coumadin.   CAD: CAD s/p 6v CABG in 1990 with St. Jude AVR, redo CABG in 1994 to the left coronary system, Stents to SVG to RCA 9/ 2015.  Chest pain in endoscopy with positive troponins 2.16 today   St. Jude AoV: Coumadin held   GI bleed: Moderate to severe gastritis. Coumadin to be resumed today, but now with positive troponins. Stay off Plavix. Bradycardia during endoscopy treated with atropine. Spoke with GI who says we can start heparin if needed  Bradycardia  beta blocker started at lower dose. Frequent PVC's  ICM: EF 40% 12/2014   LOS: 7 days    Ermalinda Barrios 06/21/2015, 8:13 AM   Patient seen and discussed with NP Bonnell Public, I agree with her documentation. EGD with moderate to severe  gastritis as etiology of GI bleed combined with being on coumadin and plavix. After the EGD episode of chest pain thought initially to be related to his gastric biopsies however troponins are now elevated. He has hx of CAD with prior CABG along with stents to the [place] in 2015. He has had a long history of recurrent GI bleeds and anemia including multiple admission since June 6168, complicated by his his need for anticoaguatlion for his mechanical aortic valve and prior need for antiplatelet agents in setting of prior stenting. He is chest pain free Given these recurrent issues I would pursue medical therapy at this time for his NSTEMI unless high risk criteria develop such as hemodynamic or electrical compromise. Destabilizing a plaque with a new stent without the ability to commit to uninterrupted antiplatelet therapy would actually likely increase his cardiac risk. Will start heparin for 48 hours, start coumadin for his heart valve, will not start antiplatelet agent at this time. Continue high dose statin, beta blocker. Increase Toprol XL to '25mg'$  bid in setting of NSTEMI and PVCs. If bp remains stable restart ACE-I tomorrow. Obtain limited echo. Would recommend transfuse to Hgb 9-10. Repeat cbc later this afternoon. If any signs of bleeding of Hgb drop stop heparin. Ok to continue NG drip today.  Zandra Abts MD

## 2015-06-21 NOTE — Progress Notes (Signed)
Trop 2.16 paged to MD

## 2015-06-21 NOTE — Progress Notes (Signed)
Subjective:  States he had a black stool this morning. Denies further chest pain. Feels "pretty good". No complaints.   Objective: Vital signs in last 24 hours: Temp:  [97 F (36.1 C)-97.9 F (36.6 C)] 97.7 F (36.5 C) (11/02 0400) Pulse Rate:  [42-131] 69 (11/02 0745) Resp:  [12-30] 15 (11/02 0215) BP: (106-181)/(48-158) 121/48 mmHg (11/02 0745) SpO2:  [98 %-100 %] 100 % (11/02 0215) Weight:  [195 lb 8.8 oz (88.7 kg)] 195 lb 8.8 oz (88.7 kg) (11/02 0500) Last BM Date: 06/17/15 General:   Alert,  Well-developed, well-nourished, pleasant and cooperative in NAD. Family at bedside. Head:  Normocephalic and atraumatic. Eyes:  Sclera clear, no icterus.  Abdomen:  Soft, nontender and nondistended. Normal bowel sounds, without guarding, and without rebound.   Extremities:  Without clubbing, deformity or edema. Neurologic:  Alert and  oriented x4;  grossly normal neurologically. Skin:  Intact without significant lesions or rashes. Psych:  Alert and cooperative. Normal mood and affect.  Intake/Output from previous day: 11/01 0701 - 11/02 0700 In: 1123.7 [I.V.:1123.7] Out: -  Intake/Output this shift:    Lab Results: CBC  Recent Labs  06/19/15 0550 06/20/15 1430 06/20/15 2237 06/21/15 0209  WBC 5.3 6.4  --  7.8  HGB 8.6* 9.4* 8.3* 8.1*  HCT 28.4* 30.9* 27.2* 26.4*  MCV 81.6 80.7  --  81.0  PLT 243 247  --  250   BMET  Recent Labs  06/20/15 1430  NA 140  K 4.1  CL 109  CO2 25  GLUCOSE 168*  BUN 13  CREATININE 1.08  CALCIUM 9.5   LFTs No results for input(s): BILITOT, BILIDIR, IBILI, ALKPHOS, AST, ALT, PROT, ALBUMIN in the last 72 hours. No results for input(s): LIPASE in the last 72 hours. PT/INR  Recent Labs  06/19/15 0550 06/20/15 0635 06/21/15 0209  LABPROT 26.6* 19.9* 17.2*  INR 2.49* 1.70* 1.40      Imaging Studies: [place] Art/ven Flow Abd Pelv Doppler  06/06/2015  CLINICAL DATA:  74 year old male with a history of chronic mesenteric ischemia. Status  post superior mesenteric artery stent March 06, 2015. He presents today for baseline mesenteric duplex and evaluation of superior mesenteric artery stent. NPO status has been recommended. The patient is not NPO. Cardiovascular risk factors include hypertension, known coronary artery disease, known vascular disease, diabetes, history of tobacco use EXAM: MESENTERIC ARTERIAL DUPLEX TECHNIQUE: Color and duplex Doppler ultrasound was performed to evaluate the mesenteric arteries. COMPARISON:  Angiogram 03/06/2015, CT 03/02/2015, 03/01/2015 FINDINGS: Celiac artery:  Bowel gas obscures visualization Splenic artery:  Bowel gas obscures visualization Hepatic artery:  Bowel gas obscures visualization Proximal velocity 73 centimeter/second Proximal Aorta:  2.7 cm ; 75 centimeter/second velocity Mid aorta:  2.2 cm Distal aorta:  1.9 cm IMPRESSION: Limited mesenteric duplex as the bowel gas limits visualization of the celiac artery and superior mesenteric artery. SMA stent is not evaluated on this study. No evidence of abdominal aortic aneurysm. Signed, Dulcy Fanny. Earleen Newport, DO [facility] Electronically Signed   By: Corrie Mckusick D.O.   On: 06/06/2015 14:19   Dg Chest Port 1 View  06/15/2015  CLINICAL DATA:  Abdominal pain.  Admitted for recurrent GI bleed. EXAM: PORTABLE CHEST 1 VIEW COMPARISON:  Radiograph 05/30/2015 FINDINGS: Sternotomy wires overlie stable enlarged cardiac silhouette. Chronic elevation LEFT hemidiaphragm. No effusion infiltrate pneumothorax. IMPRESSION: No acute cardiopulmonary findings. Cardiomegaly and elevation of LEFT hemidiaphragm Electronically Signed   By: Helane Gunther.D.  On: 06/15/2015 16:05   Dg Chest Portable 1 View  05/30/2015  CLINICAL DATA:  EKG changes, hypertension, diabetes EXAM: PORTABLE CHEST 1 VIEW COMPARISON:  02/04/2015 FINDINGS: Mild right basilar airspace disease which may reflect scarring versus atelectasis. There  is no other focal parenchymal opacity. There is no pleural effusion or pneumothorax. There is stable cardiomegaly. There is evidence of prior CABG. The osseous structures are unremarkable. IMPRESSION: 1. Mild right basilar airspace disease which may reflect scarring versus atelectasis. Electronically Signed   By: Kathreen Devoid   On: 05/30/2015 12:12   Dg Abd Portable 1v  06/15/2015  CLINICAL DATA:  Abdominal pain.  Recurrent GI bleeding. EXAM: PORTABLE ABDOMEN - 1 VIEW COMPARISON:  None. FINDINGS: The bowel gas pattern is normal. Extensive aortic and visceral arterial vascular calcification noted. IMPRESSION: No acute findings. Electronically Signed   By: Earle Gell M.D.   On: 06/15/2015 16:10  [2 weeks]   Assessment: 74 year old male with complicated past medical history, admitted with upper GI bleed, with recent outpatient capsule study noting gastric abnormalities. Recent colonoscopy up-to-date. Presentation complicated with anticoagulation.  EGD yesterday showed diffuse erythema/erosions at the greater curvature at the fundus extending to the antrum. Edema and erythema/erosions on angularis. S/p biopsy.   Post-procedure, patient had chest pain. Positive troponins. Cardiology planning to treat medically. Heparin to be initiated as well as coumadin. Discussed with cardiology. Agree with transfuse to 9-10 range and monitor closely.  Patient reports black stool this morning, his hgb is stable. Monitor closely.    Plan: 1. Continue PPI BID.  2. Monitor for recurrent GI bleed and drop in Hgb closely.  3. F/u pending biopsies as available.  4. Agree with transfuse to Hgb 9-10 range. Give one unit now.  Laureen Ochs. Bernarda Caffey [facility] [phone] 11/2/201610:22 AM     LOS: 7 days

## 2015-06-21 NOTE — Progress Notes (Signed)
ANTICOAGULATION CONSULT NOTE - Initial Consult  Pharmacy Consult for Heparin Indication: chest pain/ACS  No Known Allergies  Patient Measurements: Height: '5\' 10"'$  (177.8 cm) Weight: 195 lb 8.8 oz (88.7 kg) IBW/kg (Calculated) : 73 HEPARIN DW (KG): 91.3  Vital Signs: Temp: 97.7 F (36.5 C) (11/02 0400) Temp Source: Oral (11/02 0400) BP: 121/48 mmHg (11/02 0745) Pulse Rate: 69 (11/02 0745)  Labs:  Recent Labs  06/19/15 0550 06/20/15 0635  06/20/15 1430 06/20/15 2005 06/20/15 2237 06/21/15 0209 06/21/15 0824  HGB 8.6*  --   --  9.4*  --  8.3* 8.1*  --   HCT 28.4*  --   --  30.9*  --  27.2* 26.4*  --   PLT 243  --   --  247  --   --  250  --   LABPROT 26.6* 19.9*  --   --   --   --  17.2*  --   INR 2.49* 1.70*  --   --   --   --  1.40  --   CREATININE  --   --   --  1.08  --   --   --   --   TROPONINI  --   --   < > 0.05* 0.51*  --  2.16* 2.37*  < > = values in this interval not displayed.  Estimated Creatinine Clearance: 67.3 mL/min (by C-G formula based on Cr of 1.08).  Medical History: Past Medical History  Diagnosis Date  . Type 2 diabetes mellitus ([place]) 2007  . Essential hypertension   . Gout   . Hypercholesteremia   . Peripheral vascular disease ([place])     Lower extremity PCI/stenting  . COPD (chronic obstructive pulmonary disease) ([place])   . S/P aortic valve replacement 1990    a. St. Jude  . Chronic back pain   . Dysphagia   . Neuromuscular disorder ([place])   . Peripheral neuropathy ([place])   . Critical lower limb ischemia   . CAD (coronary artery disease)     a. 05/13/14 [name] s/p overlapping DESx2 to SVG to RCA. b.  s/p CABG '90 with redo '94 & stent to RCA SVG in 2005  . Chronic toe ulcer ([place])     a. Left foot  . Chronic systolic heart failure ([place])   . History of kidney stones   . GERD (gastroesophageal reflux disease)   . Arthritis   . Sleep apnea   . Myocardial infarction ([place])   . GI bleeding 05/31/2015    Source not identified.  . Acute blood  loss anemia 02/2015 & 05/2015    Hemoglobin 5.5 on 05/31/15; status post transfusion.   Assessment/Plan:  NSTEMI: Chest pain in endoscopy. Positive troponins up to 2.16 this morning chronic left bundle branch block-spoke with GI-ok to use heparin if needed. Will discuss possible transfer and cath with Dr. Harl Bowie since off Coumadin.  CAD: CAD s/p 6v CABG in 1990 with St. Jude AVR, redo CABG in 1994 to the left coronary system, Stents to SVG to RCA 9/ 2015. Chest pain in endoscopy with positive troponins 2.16 today  St. Jude AoV: Coumadin held  GI bleed: Moderate to severe gastritis. Coumadin to be resumed today, but now with positive troponins. Stay off Plavix. Bradycardia during endoscopy treated with atropine. Spoke with GI who says we can start heparin if needed  Goal of Therapy:  Heparin level 0.3-0.7 units/ml Monitor platelets and H/H per pharmacy protocol : Yes   Plan:  Heparin 1000 units IV bolus now x 1 (reduced due to GIB/Coumadin Rx) Heparin infusion at 1100 units/hr Heparin level in 6-8 hrs then daily CBC daily while on Heparin  [place] Crane, Marcheta Horsey A 06/21/2015,9:36 AM

## 2015-06-21 NOTE — Progress Notes (Signed)
ANTICOAGULATION CONSULT NOTE   Pharmacy Consult for Heparin Indication: chest pain/ACS  No Known Allergies  Patient Measurements: Height: '5\' 10"'$  (177.8 cm) Weight: 195 lb 8.8 oz (88.7 kg) IBW/kg (Calculated) : 73 HEPARIN DW (KG): 91.3  Vital Signs: Temp: 98.2 F (36.8 C) (11/02 1635) Temp Source: Oral (11/02 1635) BP: 137/58 mmHg (11/02 1800) Pulse Rate: 62 (11/02 1230)  Labs:  Recent Labs  06/19/15 0550 06/20/15 0635 06/20/15 1430  06/20/15 2237 06/21/15 0209 06/21/15 0824 06/21/15 1556 06/21/15 1557  HGB 8.6*  --  9.4*  --  8.3* 8.1*  --   --  8.5*  HCT 28.4*  --  30.9*  --  27.2* 26.4*  --   --  28.5*  PLT 243  --  247  --   --  250  --   --  229  LABPROT 26.6* 19.9*  --   --   --  17.2*  --   --   --   INR 2.49* 1.70*  --   --   --  1.40  --   --   --   HEPARINUNFRC  --   --   --   --   --   --   --  0.15*  --   CREATININE  --   --  1.08  --   --   --   --   --   --   TROPONINI  --   --  0.05*  < >  --  2.16* 2.37* 2.26*  --   < > = values in this interval not displayed.  Estimated Creatinine Clearance: 67.3 mL/min (by C-G formula based on Cr of 1.08).  Medical History: Past Medical History  Diagnosis Date  . Type 2 diabetes mellitus ([place]) 2007  . Essential hypertension   . Gout   . Hypercholesteremia   . Peripheral vascular disease ([place])     Lower extremity PCI/stenting  . COPD (chronic obstructive pulmonary disease) ([place])   . S/P aortic valve replacement 1990    a. St. Jude  . Chronic back pain   . Dysphagia   . Neuromuscular disorder ([place])   . Peripheral neuropathy ([place])   . Critical lower limb ischemia   . CAD (coronary artery disease)     a. 05/13/14 [name] s/p overlapping DESx2 to SVG to RCA. b.  s/p CABG '90 with redo '94 & stent to RCA SVG in 2005  . Chronic toe ulcer ([place])     a. Left foot  . Chronic systolic heart failure ([place])   . History of kidney stones   . GERD (gastroesophageal reflux disease)   . Arthritis   . Sleep apnea   .  Myocardial infarction ([place])   . GI bleeding 05/31/2015    Source not identified.  . Acute blood loss anemia 02/2015 & 05/2015    Hemoglobin 5.5 on 05/31/15; status post transfusion.   Assessment/Plan:  GI bleed: Moderate to severe gastritis. Coumadin to be resumed but now with positive troponins. Spoke with GI who says we can start heparin if needed  Heparin level is below goal.  INR is SUBtherapeutic. CBC is stable, pt being transfused.  Goal of Therapy:  Heparin level 0.3-0.7 units/ml Monitor platelets and H/H per pharmacy protocol : Yes   Plan:  Increase Heparin infusion to 1350 units/hr Heparin level daily CBC daily while on Heparin  [place] Crane, Soul Hackman A 06/21/2015,6:28 PM

## 2015-06-21 NOTE — Progress Notes (Addendum)
Pt started complaining of increased chest pain 6/10 about 2120.  Gave patient 4 mg of Morphine and restarted nitroglycerin drip at 71mg/min(2133). Increased nitro drip at 5 minute interval(2138) to 121m/min and 15 mcg/min at 2143. Pt then rated pain at a 3 and so I increased nitro last time to 2075mmin and pt rated pain a 1. Will continue to monitor.

## 2015-06-21 NOTE — Progress Notes (Signed)
IV placed @ 1208.  20 gauge with 1.88" catheter inserted in Right arm; ultrasound used for placement.  Now that access is established, can transfuse blood.

## 2015-06-21 NOTE — Progress Notes (Signed)
ANTICOAGULATION CONSULT NOTE - Initial Consult  Pharmacy Consult for Coumadin (chronic Rx PTA) Indication: AVR  No Known Allergies  Patient Measurements: Height: '5\' 10"'$  (177.8 cm) Weight: 195 lb 8.8 oz (88.7 kg) IBW/kg (Calculated) : 73  Vital Signs: Temp: 98.5 F (36.9 C) (11/02 1230) Temp Source: Oral (11/02 1230) BP: 112/54 mmHg (11/02 1330) Pulse Rate: 62 (11/02 1230)  Labs:  Recent Labs  06/19/15 0550 06/20/15 0635  06/20/15 1430 06/20/15 2005 06/20/15 2237 06/21/15 0209 06/21/15 0824  HGB 8.6*  --   --  9.4*  --  8.3* 8.1*  --   HCT 28.4*  --   --  30.9*  --  27.2* 26.4*  --   PLT 243  --   --  247  --   --  250  --   LABPROT 26.6* 19.9*  --   --   --   --  17.2*  --   INR 2.49* 1.70*  --   --   --   --  1.40  --   CREATININE  --   --   --  1.08  --   --   --   --   TROPONINI  --   --   < > 0.05* 0.51*  --  2.16* 2.37*  < > = values in this interval not displayed.  Estimated Creatinine Clearance: 67.3 mL/min (by C-G formula based on Cr of 1.08).  Medical History: Past Medical History  Diagnosis Date  . Type 2 diabetes mellitus ([place]) 2007  . Essential hypertension   . Gout   . Hypercholesteremia   . Peripheral vascular disease ([place])     Lower extremity PCI/stenting  . COPD (chronic obstructive pulmonary disease) ([place])   . S/P aortic valve replacement 1990    a. St. Jude  . Chronic back pain   . Dysphagia   . Neuromuscular disorder ([place])   . Peripheral neuropathy ([place])   . Critical lower limb ischemia   . CAD (coronary artery disease)     a. 05/13/14 [name] s/p overlapping DESx2 to SVG to RCA. b.  s/p CABG '90 with redo '94 & stent to RCA SVG in 2005  . Chronic toe ulcer ([place])     a. Left foot  . Chronic systolic heart failure ([place])   . History of kidney stones   . GERD (gastroesophageal reflux disease)   . Arthritis   . Sleep apnea   . Myocardial infarction ([place])   . GI bleeding 05/31/2015    Source not identified.  . Acute blood loss anemia  02/2015 & 05/2015    Hemoglobin 5.5 on 05/31/15; status post transfusion.   Medications:  Prescriptions prior to admission  Medication Sig Dispense Refill Last Dose  . albuterol (PROVENTIL) 4 MG tablet Take 4 mg by mouth 3 (three) times daily.   06/14/2015 at Unknown time  . albuterol-ipratropium (COMBIVENT) 18-103 MCG/ACT inhaler Inhale 1 puff into the lungs 4 (four) times daily. Coughing/ Shortness of Breath   06/14/2015 at Unknown time  . allopurinol (ZYLOPRIM) 300 MG tablet Take 300 mg by mouth daily.   06/14/2015 at Unknown time  . cholecalciferol (VITAMIN D) 1000 UNITS tablet Take 2,000 Units by mouth daily.   06/14/2015 at Unknown time  . clopidogrel (PLAVIX) 75 MG tablet Take 75 mg by mouth daily.   06/14/2015 at Unknown time  . Emollient (EUCERIN) lotion Apply 10 mLs topically as needed for dry skin.   Past Week at Unknown time  .  fenofibrate (TRICOR) 145 MG tablet Take 1 tablet (145 mg total) by mouth daily. NEED OV. 30 tablet 0 06/14/2015 at Unknown time  . furosemide (LASIX) 40 MG tablet Take 1 tablet (40 mg total) by mouth daily. RESTART ON SATURDAY, 09/03/14. (Patient taking differently: Take 80 mg by mouth daily. )   06/14/2015 at Unknown time  . glimepiride (AMARYL) 1 MG tablet Take 1 mg by mouth 2 (two) times daily.   06/14/2015 at Unknown time  . isosorbide mononitrate (IMDUR) 30 MG 24 hr tablet Take 1 tablet (30 mg total) by mouth daily. <PLEASE MAKE APPOINTMENT FOR REFILLS> (Patient taking differently: Take 30 mg by mouth daily. ) 30 tablet 0 06/14/2015 at Unknown time  . metoprolol succinate (TOPROL XL) 50 MG 24 hr tablet Take 1 tablet (50 mg total) by mouth daily. RESTART ON Saturday 06/04/15. Take with or immediately following a meal. (Patient taking differently: Take 50 mg by mouth daily. Take with or immediately following a meal.)   06/13/2015 at 2030  . nitroGLYCERIN (NITROSTAT) 0.4 MG SL tablet Place 1 tablet (0.4 mg total) under the tongue every 5 (five) minutes as needed  for chest pain. 25 tablet 12 unknown  . oxyCODONE-acetaminophen (PERCOCET) 10-325 MG per tablet Take 1 tablet by mouth every 4 (four) hours as needed for pain. (Patient taking differently: Take 1 tablet by mouth every 3 (three) hours as needed for pain. ) 30 tablet 0 06/14/2015 at morning  . potassium chloride SA (K-DUR,KLOR-CON) 20 MEQ tablet Take 20 mEq by mouth daily.     06/14/2015 at Unknown time  . pregabalin (LYRICA) 50 MG capsule Take one capsule by mouth twice daily for pains (Patient taking differently: Take 50 mg by mouth 3 (three) times daily. ) 60 capsule 5 06/14/2015 at Unknown time  . ramipril (ALTACE) 2.5 MG capsule Take 1 capsule (2.5 mg total) by mouth 2 (two) times daily. (Patient taking differently: Take 2.5-5 mg by mouth 2 (two) times daily. Takes '5mg'$  capsule daily in the morning and takes 2.'5mg'$  capsule at bedtime) 60 capsule 3 06/13/2015 at Unknown time  . rosuvastatin (CRESTOR) 20 MG tablet TAKE (1) TABLET BY MOUTH AT BEDTIME FOR CHOLESTEROL. 30 tablet 0 06/13/2015 at Unknown time  . sitaGLIPtin (JANUVIA) 50 MG tablet Take 50 mg by mouth daily.   06/14/2015 at Unknown time  . vitamin C (ASCORBIC ACID) 500 MG tablet Take 500 mg by mouth daily.   06/14/2015 at Unknown time  . warfarin (COUMADIN) 5 MG tablet Take 1 tablet by mouth daily or as directed by coumadin clinic. (Patient taking differently: Take 2.5-5 mg by mouth. 2.'5mg'$  on Mondays and Fridays only. Takes '5mg'$  on all other day) 30 tablet 1 06/13/2015 at 2030  . ferrous sulfate 325 (65 FE) MG EC tablet Take 1 tablet (325 mg total) by mouth daily with breakfast. (Patient not taking: Reported on 06/08/2015)   Not Taking  . fish oil-omega-3 fatty acids 1000 MG capsule Take 1 capsule (1 g total) by mouth 2 (two) times daily. (Patient not taking: Reported on 06/15/2015)   Completed Course at Unknown time   Assessment: 3 yom PMH of CAD with multiple stents (on Plavix) and mechanical AVR (on Coumadin) presented with melena. Patient  had a recent hospitalization(10/11-10/14/2016) for GI bleeding and underwent a GI workup.   INR is SUBtherapeutic.  Pt being transfused due to GIB and need for anticoagulation. Pt also on IV Heparin for ACS.  Goal of Therapy:  INR 2-3 Monitor platelets by  anticoagulation protocol: Yes   Plan:  Coumadin '5mg'$  po today x 1 (home dose) INR and CBC daily - monitor for s/sx of bleeding  Hart Robinsons A 06/21/2015,3:05 PM

## 2015-06-21 NOTE — Progress Notes (Signed)
[facility] PROGRESS NOTE  Tanner Pena JKK:[id number] DOB: [birth year] DOA: 06/14/2015 PCP: [name], MD  Assessment/Plan: Upper GI Bleed/Acute Blood Loss Anemia -Due to erosive gastritis noted on EGD. -Transfuse as necessary given necessity for anticoagulation.  NSTEMI -Troponins continue to rise to a high of 2.37. -Seen by cards: started on a heparin drip; no plans for cath as we cannot commit to uninterrupted plavix for 1 year given his concomitant GI bleed. -Plan for medical management. -ECHO. -Will transfuse to a Hb of 10. -Continue statin/beta blocker.  AVR on Chronic Anticoagulation -Coumadin to be restarted today.  PVD s/p Left BKA -Noted.  ARF -Resolved.  DM II -Uncontrolled. -Start moderate SSI with meal coverage and lantus 10, since he will be on a diet. -Continue to adjust regimen as required.  Chronic Systolic CHF -ECHO 02/1695 with EF of 40%. -Compensated.  Essential HTN -Fair control.  Code Status: Full Code Family Communication: Friend at bedside updated on plan of care and all questions answered  Disposition Plan: Keep in SDU   Consultants:  Cardiology  GI   Antibiotics:  None   Subjective: No current CP/SOB. Wondering when he will be able to go home.  Objective: Filed Vitals:   06/21/15 0400 06/21/15 0500 06/21/15 0745 06/21/15 0830  BP:   121/48   Pulse:   69   Temp: 97.7 F (36.5 C)   98 F (36.7 C)  TempSrc: Oral   Oral  Resp:      Height:      Weight:  88.7 kg (195 lb 8.8 oz)    SpO2:        Intake/Output Summary (Last 24 hours) at 06/21/15 1059 Last data filed at 06/21/15 0200  Gross per 24 hour  Intake 1123.7 ml  Output      0 ml  Net 1123.7 ml   Filed Weights   06/15/15 0500 06/16/15 0500 06/21/15 0500  Weight: 91 kg (200 lb 9.9 oz) 90.9 kg (200 lb 6.4 oz) 88.7 kg (195 lb 8.8 oz)    Exam:   General:  AA Ox3  Cardiovascular: RRR  Respiratory: CTA B  Abdomen:  S/NT/ND/+BS  Extremities: s/p left BKA, right trace edema   Neurologic:  Grossly intact and non-focal  Data Reviewed: Basic Metabolic Panel:  Recent Labs Lab 06/14/15 1859 06/15/15 0449 06/16/15 0449 06/20/15 1430  NA 142 144 141 140  K 5.0 4.1 4.4 4.1  CL 111 113* 113* 109  CO2 '26 24 22 25  '$ GLUCOSE 134* 90 106* 168*  BUN 78* 76* 43* 13  CREATININE 1.59* 1.53* 1.23 1.08  CALCIUM 9.2 8.8* 8.8* 9.5   Liver Function Tests:  Recent Labs Lab 06/14/15 1859  AST 16  ALT 11*  ALKPHOS 18*  BILITOT 0.7  PROT 5.9*  ALBUMIN 3.2*   No results for input(s): LIPASE, AMYLASE in the last 168 hours. No results for input(s): AMMONIA in the last 168 hours. CBC:  Recent Labs Lab 06/14/15 1859  06/16/15 0449 06/18/15 0548 06/19/15 0550 06/20/15 1430 06/20/15 2237 06/21/15 0209  WBC 11.3*  < > 7.3 5.3 5.3 6.4  --  7.8  NEUTROABS 7.4  --   --   --   --   --   --   --   HGB 7.3*  < > 8.4* 8.3* 8.6* 9.4* 8.3* 8.1*  HCT 24.3*  < > 27.1* 26.7* 28.4* 30.9* 27.2* 26.4*  MCV 79.9  < > 79.5 80.4 81.6 80.7  --  81.0  PLT 366  < > 260 242 243 247  --  250  < > = values in this interval not displayed. Cardiac Enzymes:  Recent Labs Lab 06/14/15 1859 06/20/15 1430 06/20/15 2005 06/21/15 0209 06/21/15 0824  TROPONINI 0.03 0.05* 0.51* 2.16* 2.37*   BNP (last 3 results)  Recent Labs  12/28/14 0134 02/04/15 0413  BNP 504.3* 593.2*    ProBNP (last 3 results) No results for input(s): PROBNP in the last 8760 hours.  CBG:  Recent Labs Lab 06/20/15 1023 06/20/15 1224 06/20/15 1624 06/20/15 2108 06/21/15 0809  GLUCAP 126* 135* 174* 194* 190*    Recent Results (from the past 240 hour(s))  MRSA PCR Screening     Status: None   Collection Time: 06/14/15  9:53 PM  Result Value Ref Range Status   MRSA by PCR NEGATIVE NEGATIVE Final    Comment:        The GeneXpert MRSA Assay (FDA approved for NASAL specimens only), is one component of a comprehensive MRSA  colonization surveillance program. It is not intended to diagnose MRSA infection nor to guide or monitor treatment for MRSA infections.      Studies: No results found.  Scheduled Meds: . sodium chloride   Intravenous Once  . sodium chloride   Intravenous Once  . sodium chloride   Intravenous Once  . acetaminophen  650 mg Oral Once  . allopurinol  300 mg Oral Daily  . cholecalciferol  2,000 Units Oral Daily  . diphenhydrAMINE  25 mg Oral Once  . heparin  1,000 Units Intravenous Once  . insulin aspart  0-9 Units Subcutaneous TID WC  . metoprolol succinate  25 mg Oral BID  .  morphine injection  2 mg Intravenous Once  . omega-3 acid ethyl esters  1 g Oral BID  . pantoprazole  40 mg Oral BID AC  . potassium chloride SA  20 mEq Oral Daily  . rosuvastatin  20 mg Oral Daily   Continuous Infusions: . heparin    . nitroGLYCERIN 80 mcg/min (06/21/15 0200)    Principal Problem:   GI bleed Active Problems:   H/O aortic valve replacement- St Jude   PVD prior SFA PTA with chronic LE disease, not amenable to PTA   DM2 (diabetes mellitus, type 2) (HCC)   Hyperlipidemia with target LDL less than 70   COPD (chronic obstructive pulmonary disease) (HCC)   NSTEMI (non-ST elevated myocardial infarction) (HCC)   Chronic anticoagulation   Acute blood loss anemia   Chronic systolic heart failure (HCC)   Coronary artery disease involving native coronary artery of native heart with angina pectoris (HCC)   Essential hypertension   History of coronary artery stent placement   Anticoagulation management encounter   Bleeding gastrointestinal   Melena   Absolute anemia   GIB (gastrointestinal bleeding)    Time spent: 35 minutes. Greater than 50% of this time was spent in direct contact with the patient coordinating care.    Lelon Frohlich  [facility] Pager [phone]  If 7PM-7AM, please contact night-coverage at [email or web address], password [facility] 06/21/2015, 10:59 AM  LOS: 7  days

## 2015-06-22 ENCOUNTER — Ambulatory Visit: Payer: Medicare Other | Admitting: Cardiovascular Disease

## 2015-06-22 DIAGNOSIS — K2951 Unspecified chronic gastritis with bleeding: Secondary | ICD-10-CM

## 2015-06-22 DIAGNOSIS — K2990 Gastroduodenitis, unspecified, without bleeding: Secondary | ICD-10-CM

## 2015-06-22 DIAGNOSIS — K2970 Gastritis, unspecified, without bleeding: Secondary | ICD-10-CM | POA: Insufficient documentation

## 2015-06-22 LAB — TYPE AND SCREEN
ABO/RH(D): A POS
Antibody Screen: NEGATIVE
UNIT DIVISION: 0
UNIT DIVISION: 0

## 2015-06-22 LAB — GLUCOSE, CAPILLARY
GLUCOSE-CAPILLARY: 128 mg/dL — AB (ref 65–99)
GLUCOSE-CAPILLARY: 123 mg/dL — AB (ref 65–99)
GLUCOSE-CAPILLARY: 140 mg/dL — AB (ref 65–99)
Glucose-Capillary: 163 mg/dL — ABNORMAL HIGH (ref 65–99)

## 2015-06-22 LAB — CBC
HCT: 33.2 % — ABNORMAL LOW (ref 39.0–52.0)
Hemoglobin: 10.5 g/dL — ABNORMAL LOW (ref 13.0–17.0)
MCH: 26 pg (ref 26.0–34.0)
MCHC: 31.6 g/dL (ref 30.0–36.0)
MCV: 82.2 fL (ref 78.0–100.0)
PLATELETS: 200 10*3/uL (ref 150–400)
RBC: 4.04 MIL/uL — ABNORMAL LOW (ref 4.22–5.81)
RDW: 18.7 % — AB (ref 11.5–15.5)
WBC: 7.9 10*3/uL (ref 4.0–10.5)

## 2015-06-22 LAB — PROTIME-INR
INR: 1.51 — AB (ref 0.00–1.49)
PROTHROMBIN TIME: 18.2 s — AB (ref 11.6–15.2)

## 2015-06-22 LAB — HEPARIN LEVEL (UNFRACTIONATED): Heparin Unfractionated: 0.64 [IU]/mL (ref 0.30–0.70)

## 2015-06-22 MED ORDER — WARFARIN SODIUM 5 MG PO TABS
5.0000 mg | ORAL_TABLET | Freq: Once | ORAL | Status: AC
  Administered 2015-06-22: 5 mg via ORAL
  Filled 2015-06-22: qty 1

## 2015-06-22 MED ORDER — RAMIPRIL 1.25 MG PO CAPS
2.5000 mg | ORAL_CAPSULE | Freq: Every day | ORAL | Status: DC
  Administered 2015-06-22 – 2015-06-23 (×2): 2.5 mg via ORAL
  Filled 2015-06-22 (×2): qty 2

## 2015-06-22 MED ORDER — ISOSORBIDE MONONITRATE ER 30 MG PO TB24
30.0000 mg | ORAL_TABLET | Freq: Every day | ORAL | Status: DC
  Administered 2015-06-22 – 2015-06-23 (×2): 30 mg via ORAL
  Filled 2015-06-22 (×2): qty 1

## 2015-06-22 NOTE — Progress Notes (Signed)
[place] for Coumadin (chronic Rx PTA) Indication: AVR (pt also on Heparin)  No Known Allergies  Patient Measurements: Height: '5\' 10"'$  (177.8 cm) Weight: 202 lb 6.1 oz (91.8 kg) IBW/kg (Calculated) : 73  Vital Signs: Temp: 97.7 F (36.5 C) (11/03 0400) Temp Source: Oral (11/03 0400) BP: 128/53 mmHg (11/03 0400) Pulse Rate: 31 (11/03 0400)  Labs:  Recent Labs  06/20/15 0635  06/20/15 1430  06/21/15 0209 06/21/15 0824 06/21/15 1556 06/21/15 1557 06/22/15 0450  HGB  --   --  9.4*  < > 8.1*  --   --  8.5* 10.5*  HCT  --   --  30.9*  < > 26.4*  --   --  28.5* 33.2*  PLT  --   < > 247  --  250  --   --  229 200  LABPROT 19.9*  --   --   --  17.2*  --   --   --  18.2*  INR 1.70*  --   --   --  1.40  --   --   --  1.51*  HEPARINUNFRC  --   --   --   --   --   --  0.15*  --  0.64  CREATININE  --   --  1.08  --   --   --   --   --   --   TROPONINI  --   --  0.05*  < > 2.16* 2.37* 2.26*  --   --   < > = values in this interval not displayed.  Estimated Creatinine Clearance: 68.3 mL/min (by C-G formula based on Cr of 1.08).  Medical History: Past Medical History  Diagnosis Date  . Type 2 diabetes mellitus ([place]) 2007  . Essential hypertension   . Gout   . Hypercholesteremia   . Peripheral vascular disease ([place])     Lower extremity PCI/stenting  . COPD (chronic obstructive pulmonary disease) ([place])   . S/P aortic valve replacement 1990    a. St. Jude  . Chronic back pain   . Dysphagia   . Neuromuscular disorder ([place])   . Peripheral neuropathy ([place])   . Critical lower limb ischemia   . CAD (coronary artery disease)     a. 05/13/14 [name] s/p overlapping DESx2 to SVG to RCA. b.  s/p CABG '90 with redo '94 & stent to RCA SVG in 2005  . Chronic toe ulcer ([place])     a. Left foot  . Chronic systolic heart failure ([place])   . History of kidney stones   . GERD (gastroesophageal reflux disease)   . Arthritis   . Sleep apnea   . Myocardial  infarction ([place])   . GI bleeding 05/31/2015    Source not identified.  . Acute blood loss anemia 02/2015 & 05/2015    Hemoglobin 5.5 on 05/31/15; status post transfusion.   Medications:  Prescriptions prior to admission  Medication Sig Dispense Refill Last Dose  . albuterol (PROVENTIL) 4 MG tablet Take 4 mg by mouth 3 (three) times daily.   06/14/2015 at Unknown time  . albuterol-ipratropium (COMBIVENT) 18-103 MCG/ACT inhaler Inhale 1 puff into the lungs 4 (four) times daily. Coughing/ Shortness of Breath   06/14/2015 at Unknown time  . allopurinol (ZYLOPRIM) 300 MG tablet Take 300 mg by mouth daily.   06/14/2015 at Unknown time  . cholecalciferol (VITAMIN D) 1000 UNITS tablet Take 2,000 Units by mouth  daily.   06/14/2015 at Unknown time  . clopidogrel (PLAVIX) 75 MG tablet Take 75 mg by mouth daily.   06/14/2015 at Unknown time  . Emollient (EUCERIN) lotion Apply 10 mLs topically as needed for dry skin.   Past Week at Unknown time  . fenofibrate (TRICOR) 145 MG tablet Take 1 tablet (145 mg total) by mouth daily. NEED OV. 30 tablet 0 06/14/2015 at Unknown time  . furosemide (LASIX) 40 MG tablet Take 1 tablet (40 mg total) by mouth daily. RESTART ON SATURDAY, 09/03/14. (Patient taking differently: Take 80 mg by mouth daily. )   06/14/2015 at Unknown time  . glimepiride (AMARYL) 1 MG tablet Take 1 mg by mouth 2 (two) times daily.   06/14/2015 at Unknown time  . isosorbide mononitrate (IMDUR) 30 MG 24 hr tablet Take 1 tablet (30 mg total) by mouth daily. <PLEASE MAKE APPOINTMENT FOR REFILLS> (Patient taking differently: Take 30 mg by mouth daily. ) 30 tablet 0 06/14/2015 at Unknown time  . metoprolol succinate (TOPROL XL) 50 MG 24 hr tablet Take 1 tablet (50 mg total) by mouth daily. RESTART ON Saturday 06/04/15. Take with or immediately following a meal. (Patient taking differently: Take 50 mg by mouth daily. Take with or immediately following a meal.)   06/13/2015 at 2030  . nitroGLYCERIN  (NITROSTAT) 0.4 MG SL tablet Place 1 tablet (0.4 mg total) under the tongue every 5 (five) minutes as needed for chest pain. 25 tablet 12 unknown  . oxyCODONE-acetaminophen (PERCOCET) 10-325 MG per tablet Take 1 tablet by mouth every 4 (four) hours as needed for pain. (Patient taking differently: Take 1 tablet by mouth every 3 (three) hours as needed for pain. ) 30 tablet 0 06/14/2015 at morning  . potassium chloride SA (K-DUR,KLOR-CON) 20 MEQ tablet Take 20 mEq by mouth daily.     06/14/2015 at Unknown time  . pregabalin (LYRICA) 50 MG capsule Take one capsule by mouth twice daily for pains (Patient taking differently: Take 50 mg by mouth 3 (three) times daily. ) 60 capsule 5 06/14/2015 at Unknown time  . ramipril (ALTACE) 2.5 MG capsule Take 1 capsule (2.5 mg total) by mouth 2 (two) times daily. (Patient taking differently: Take 2.5-5 mg by mouth 2 (two) times daily. Takes '5mg'$  capsule daily in the morning and takes 2.'5mg'$  capsule at bedtime) 60 capsule 3 06/13/2015 at Unknown time  . rosuvastatin (CRESTOR) 20 MG tablet TAKE (1) TABLET BY MOUTH AT BEDTIME FOR CHOLESTEROL. 30 tablet 0 06/13/2015 at Unknown time  . sitaGLIPtin (JANUVIA) 50 MG tablet Take 50 mg by mouth daily.   06/14/2015 at Unknown time  . vitamin C (ASCORBIC ACID) 500 MG tablet Take 500 mg by mouth daily.   06/14/2015 at Unknown time  . warfarin (COUMADIN) 5 MG tablet Take 1 tablet by mouth daily or as directed by coumadin clinic. (Patient taking differently: Take 2.5-5 mg by mouth. 2.'5mg'$  on Mondays and Fridays only. Takes '5mg'$  on all other day) 30 tablet 1 06/13/2015 at 2030  . ferrous sulfate 325 (65 FE) MG EC tablet Take 1 tablet (325 mg total) by mouth daily with breakfast. (Patient not taking: Reported on 06/08/2015)   Not Taking  . fish oil-omega-3 fatty acids 1000 MG capsule Take 1 capsule (1 g total) by mouth 2 (two) times daily. (Patient not taking: Reported on 06/15/2015)   Completed Course at Unknown time   Assessment: 68 yom  PMH of CAD with multiple stents (on Plavix) and mechanical AVR (on Coumadin)  presented with melena. Patient had a recent hospitalization(10/11-10/14/2016) for GI bleeding and underwent a GI workup.   INR is SUBtherapeutic.  Pt being transfused due to GIB and need for anticoagulation. Pt also on IV Heparin for ACS.  CBC appears stable.    Goal of Therapy:  INR 2-3 Monitor platelets by anticoagulation protocol: Yes   Plan:  Coumadin '5mg'$  po today x 1 (home dose) INR and CBC daily - monitor for s/sx of bleeding  [place] Crane, Kandra Graven A 06/22/2015,7:45 AM

## 2015-06-22 NOTE — Progress Notes (Signed)
Subjective:  Normal color stool this morning. He did have some more chest pain last night but this has resolved. No complaints this morning.   Objective: Vital signs in last 24 hours: Temp:  [97.1 F (36.2 C)-98.5 F (36.9 C)] 97.7 F (36.5 C) (11/03 0400) Pulse Rate:  [28-98] 31 (11/03 0400) Resp:  [14-31] 17 (11/03 0400) BP: (103-173)/(43-157) 128/53 mmHg (11/03 0400) SpO2:  [97 %-100 %] 99 % (11/03 0400) Weight:  [202 lb 6.1 oz (91.8 kg)] 202 lb 6.1 oz (91.8 kg) (11/03 0500) Last BM Date: 06/21/15 General:   Alert,  Well-developed, well-nourished, pleasant and cooperative in NAD Head:  Normocephalic and atraumatic. Eyes:  Sclera clear, no icterus.   Abdomen:  Soft, nontender and nondistended.   Extremities:  No RLE edema, s/p left BKA. Neurologic:  Alert and  oriented x4;  grossly normal neurologically. Skin:  Intact without significant lesions or rashes. Psych:  Alert and cooperative. Normal mood and affect.  Intake/Output from previous day: 11/02 0701 - 11/03 0700 In: 1490.3 [P.O.:480; I.V.:287.8; Blood:722.5] Out: 600 [Urine:600] Intake/Output this shift:    Lab Results: CBC  Recent Labs  06/21/15 0209 06/21/15 1557 06/22/15 0450  WBC 7.8 8.1 7.9  HGB 8.1* 8.5* 10.5*  HCT 26.4* 28.5* 33.2*  MCV 81.0 81.7 82.2  PLT 250 229 200   BMET  Recent Labs  06/20/15 1430  NA 140  K 4.1  CL 109  CO2 25  GLUCOSE 168*  BUN 13  CREATININE 1.08  CALCIUM 9.5   LFTs No results for input(s): BILITOT, BILIDIR, IBILI, ALKPHOS, AST, ALT, PROT, ALBUMIN in the last 72 hours. No results for input(s): LIPASE in the last 72 hours. PT/INR  Recent Labs  06/20/15 0635 06/21/15 0209 06/22/15 0450  LABPROT 19.9* 17.2* 18.2*  INR 1.70* 1.40 1.51*      Imaging Studies: [place] Art/ven Flow Abd Pelv Doppler  06/06/2015  CLINICAL DATA:  74 year old male with a history of chronic mesenteric ischemia. Status post superior mesenteric artery stent March 06, 2015. He presents today  for baseline mesenteric duplex and evaluation of superior mesenteric artery stent. NPO status has been recommended. The patient is not NPO. Cardiovascular risk factors include hypertension, known coronary artery disease, known vascular disease, diabetes, history of tobacco use EXAM: MESENTERIC ARTERIAL DUPLEX TECHNIQUE: Color and duplex Doppler ultrasound was performed to evaluate the mesenteric arteries. COMPARISON:  Angiogram 03/06/2015, CT 03/02/2015, 03/01/2015 FINDINGS: Celiac artery:  Bowel gas obscures visualization Splenic artery:  Bowel gas obscures visualization Hepatic artery:  Bowel gas obscures visualization Proximal velocity 73 centimeter/second Proximal Aorta:  2.7 cm ; 75 centimeter/second velocity Mid aorta:  2.2 cm Distal aorta:  1.9 cm IMPRESSION: Limited mesenteric duplex as the bowel gas limits visualization of the celiac artery and superior mesenteric artery. SMA stent is not evaluated on this study. No evidence of abdominal aortic aneurysm. Signed, Dulcy Fanny. Earleen Newport, DO [facility] Electronically Signed   By: Corrie Mckusick D.O.   On: 06/06/2015 14:19   Dg Chest Port 1 View  06/15/2015  CLINICAL DATA:  Abdominal pain.  Admitted for recurrent GI bleed. EXAM: PORTABLE CHEST 1 VIEW COMPARISON:  Radiograph 05/30/2015 FINDINGS: Sternotomy wires overlie stable enlarged cardiac silhouette. Chronic elevation LEFT hemidiaphragm. No effusion infiltrate pneumothorax. IMPRESSION: No acute cardiopulmonary findings. Cardiomegaly and elevation of LEFT hemidiaphragm Electronically Signed   By: Suzy Bouchard M.D.   On: 06/15/2015 16:05   Dg Chest Portable 1 View  05/30/2015  CLINICAL DATA:  EKG changes, hypertension, diabetes EXAM: PORTABLE CHEST 1 VIEW COMPARISON:  02/04/2015 FINDINGS: Mild right basilar airspace disease which may reflect scarring versus atelectasis. There is no other focal parenchymal opacity. There is no pleural effusion or  pneumothorax. There is stable cardiomegaly. There is evidence of prior CABG. The osseous structures are unremarkable. IMPRESSION: 1. Mild right basilar airspace disease which may reflect scarring versus atelectasis. Electronically Signed   By: Kathreen Devoid   On: 05/30/2015 12:12   Dg Abd Portable 1v  06/15/2015  CLINICAL DATA:  Abdominal pain.  Recurrent GI bleeding. EXAM: PORTABLE ABDOMEN - 1 VIEW COMPARISON:  None. FINDINGS: The bowel gas pattern is normal. Extensive aortic and visceral arterial vascular calcification noted. IMPRESSION: No acute findings. Electronically Signed   By: Earle Gell M.D.   On: 06/15/2015 16:10  [2 weeks]   Assessment: 74 year old male with complicated past medical history, admitted with upper GI bleed, with recent outpatient capsule study noting gastric abnormalities. Recent colonoscopy up-to-date. Presentation complicated with anticoagulation.  EGD this admission showed diffuse erythema/erosions at the greater curvature at the fundus extending to the antrum. Edema and erythema/erosions on angularis. S/p biopsy which were benign.   Post-procedure, patient had chest pain. Positive troponins. Cardiology treating medically. Patient on Heparin as well as coumadin. Discussed with cardiology. Agree with transfuse to 9-10 range and monitor closely.    Hgb up appropriately after two additional units of prbcs. No evidence of GI bleeding at this time.   Plan: 1. Continue PPI BID. 2. Continue to monitor for recurrent GI bleeding.  3. We will continue to follow peripherally.   Laureen Ochs. Bernarda Caffey [facility] [phone] 11/3/20168:48 AM     LOS: 8 days

## 2015-06-22 NOTE — Progress Notes (Signed)
[facility] PROGRESS NOTE  Tanner Pena NLG:[id number] DOB: 1941/06/18 DOA: 06/14/2015 PCP: [name], MD  Assessment/Plan: Upper GI Bleed/Acute Blood Loss Anemia -Due to erosive gastritis noted on EGD. -Has been transfused to a Hb of 10.5 in the setting of ACS. -No further bleeding noted.  NSTEMI -Troponins now trending down from a high of 2.37. -Seen by cards: started on a heparin drip; no plans for cath as we cannot commit to uninterrupted plavix for 1 year given his concomitant GI bleed. -Continue heparin drip for another 24 hours. -Plan for medical management. -Limited ECHO shows stable function with EF of 40%.. -Has been transfused to a Hb of 10. -Continue statin/beta blocker.  AVR on Chronic Anticoagulation -Coumadin has been restarted.  -INR 1.51.  PVD s/p Left BKA -Noted.  ARF -Resolved.  DM II -Much improved control. -Continue to adjust regimen as required.  Chronic Systolic CHF -ECHO 0/8144 with EF of 40%. -Compensated.  Essential HTN -Fair control.  Code Status: Full Code Family Communication: Daughter at bedside updated on plan of care and all questions answered  Disposition Plan: Keep in SDU   Consultants:  Cardiology  GI   Antibiotics:  None   Subjective: Has some CP overnight but none currently.  Objective: Filed Vitals:   06/22/15 0400 06/22/15 0500 06/22/15 0700 06/22/15 0800  BP: 128/53  127/54 142/56  Pulse: 31  50 53  Temp: 97.7 F (36.5 C)     TempSrc: Oral     Resp: '17  17 17  '$ Height:      Weight:  91.8 kg (202 lb 6.1 oz)    SpO2: 99%  99% 99%    Intake/Output Summary (Last 24 hours) at 06/22/15 0931 Last data filed at 06/22/15 0348  Gross per 24 hour  Intake 1490.3 ml  Output    600 ml  Net  890.3 ml   Filed Weights   06/16/15 0500 06/21/15 0500 06/22/15 0500  Weight: 90.9 kg (200 lb 6.4 oz) 88.7 kg (195 lb 8.8 oz) 91.8 kg (202 lb 6.1 oz)    Exam:   General:  AA  Ox3  Cardiovascular: RRR  Respiratory: CTA B  Abdomen: S/NT/ND/+BS  Extremities: s/p left BKA, right trace edema   Neurologic:  Grossly intact and non-focal  Data Reviewed: Basic Metabolic Panel:  Recent Labs Lab 06/16/15 0449 06/20/15 1430  NA 141 140  K 4.4 4.1  CL 113* 109  CO2 22 25  GLUCOSE 106* 168*  BUN 43* 13  CREATININE 1.23 1.08  CALCIUM 8.8* 9.5   Liver Function Tests: No results for input(s): AST, ALT, ALKPHOS, BILITOT, PROT, ALBUMIN in the last 168 hours. No results for input(s): LIPASE, AMYLASE in the last 168 hours. No results for input(s): AMMONIA in the last 168 hours. CBC:  Recent Labs Lab 06/19/15 0550 06/20/15 1430 06/20/15 2237 06/21/15 0209 06/21/15 1557 06/22/15 0450  WBC 5.3 6.4  --  7.8 8.1 7.9  HGB 8.6* 9.4* 8.3* 8.1* 8.5* 10.5*  HCT 28.4* 30.9* 27.2* 26.4* 28.5* 33.2*  MCV 81.6 80.7  --  81.0 81.7 82.2  PLT 243 247  --  250 229 200   Cardiac Enzymes:  Recent Labs Lab 06/20/15 1430 06/20/15 2005 06/21/15 0209 06/21/15 0824 06/21/15 1556  TROPONINI 0.05* 0.51* 2.16* 2.37* 2.26*   BNP (last 3 results)  Recent Labs  12/28/14 0134 02/04/15 0413  BNP 504.3* 593.2*    ProBNP (last 3 results) No results for input(s): PROBNP in the  last 8760 hours.  CBG:  Recent Labs Lab 06/21/15 0809 06/21/15 1147 06/21/15 1627 06/21/15 2058 06/22/15 0733  GLUCAP 190* 132* 136* 127* 128*    Recent Results (from the past 240 hour(s))  MRSA PCR Screening     Status: None   Collection Time: 06/14/15  9:53 PM  Result Value Ref Range Status   MRSA by PCR NEGATIVE NEGATIVE Final    Comment:        The GeneXpert MRSA Assay (FDA approved for NASAL specimens only), is one component of a comprehensive MRSA colonization surveillance program. It is not intended to diagnose MRSA infection nor to guide or monitor treatment for MRSA infections.      Studies: No results found.  Scheduled Meds: . sodium chloride   Intravenous  Once  . allopurinol  300 mg Oral Daily  . cholecalciferol  2,000 Units Oral Daily  . insulin aspart  0-15 Units Subcutaneous TID WC  . insulin aspart  0-5 Units Subcutaneous QHS  . insulin aspart  4 Units Subcutaneous TID WC  . insulin detemir  10 Units Subcutaneous QHS  . isosorbide mononitrate  30 mg Oral Daily  . metoprolol succinate  25 mg Oral BID  .  morphine injection  2 mg Intravenous Once  . omega-3 acid ethyl esters  1 g Oral BID  . pantoprazole  40 mg Oral BID AC  . potassium chloride SA  20 mEq Oral Daily  . ramipril  2.5 mg Oral Daily  . rosuvastatin  20 mg Oral Daily  . warfarin  5 mg Oral Once  . Warfarin - Pharmacist Dosing Inpatient   Does not apply Q24H   Continuous Infusions: . 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, not amenable to PTA   DM2 (diabetes mellitus, type 2) (HCC)   Hyperlipidemia with target LDL less than 70   COPD (chronic obstructive pulmonary disease) (HCC)   NSTEMI (non-ST elevated myocardial infarction) (HCC)   Chronic anticoagulation   Acute blood loss anemia   Chronic systolic heart failure (HCC)   Coronary artery disease involving native coronary artery of native heart with angina pectoris (HCC)   Essential hypertension   History of coronary artery stent placement   Anticoagulation management encounter   Bleeding gastrointestinal   Melena   Absolute anemia   GIB (gastrointestinal bleeding)   Gastritis and gastroduodenitis    Time spent: 25 minutes. Greater than 50% of this time was spent in direct contact with the patient coordinating care.    Lelon Frohlich  [facility] Pager [phone]  If 7PM-7AM, please contact night-coverage at [email or web address], password [email or web address] 06/22/2015, 9:31 AM  LOS: 8 days

## 2015-06-22 NOTE — Progress Notes (Addendum)
Consulting cardiologist: Carlyle Dolly MD Primary Cardiologist: [name] MD  Cardiology Specific Problem List:* 1. CAD 2. PAD 3. St. Jude Mechanical Aortic Valve on Coumadin   Subjective:    No more chest pain continues on NTG gtt.   Objective:   Temp:  [97.1 F (36.2 C)-98.5 F (36.9 C)] 97.7 F (36.5 C) (11/03 0400) Pulse Rate:  [28-98] 31 (11/03 0400) Resp:  [14-31] 17 (11/03 0400) BP: (103-173)/(43-157) 128/53 mmHg (11/03 0400) SpO2:  [97 %-100 %] 99 % (11/03 0400) Weight:  [202 lb 6.1 oz (91.8 kg)] 202 lb 6.1 oz (91.8 kg) (11/03 0500) Last BM Date: 06/21/15  Filed Weights   06/16/15 0500 06/21/15 0500 06/22/15 0500  Weight: 200 lb 6.4 oz (90.9 kg) 195 lb 8.8 oz (88.7 kg) 202 lb 6.1 oz (91.8 kg)    Intake/Output Summary (Last 24 hours) at 06/22/15 0752 Last data filed at 06/22/15 0348  Gross per 24 hour  Intake 1490.3 ml  Output    600 ml  Net  890.3 ml    Telemetry: Sinus bradycardia, rates in the 50's.   Exam:  General: No acute distress.  HEENT: Conjunctiva and lids normal, oropharynx clear.  Lungs: Clear to auscultation, nonlabored.  Cardiac: No elevated JVP or bruits. RRR, bradycardic, 1/6 systolic murmur, no gallop or rub.   Abdomen: Normoactive bowel sounds, nontender, nondistended.  Extremities: No pitting edema, distal pulses full.  Neuropsychiatric: Alert and oriented x3, affect appropriate.  Echocardiogram 06/21/2015 Left ventricle: The cavity size was mildly dilated. Wall thickness was increased increased in a pattern of mild to moderate LVH. Systolic function was moderately reduced. The estimated ejection fraction was = 40%. Diffuse hypokinesis. Features are consistent with a pseudonormal left ventricular filling pattern, with concomitant abnormal relaxation and increased filling pressure (grade 2 diastolic dysfunction). - Aortic valve: Mean gradient (S): 15 mm Hg. Peak gradient (S): 37 mm Hg. Valve area  (Vmax): 0.83 cm^2. - Mitral valve: There was mild regurgitation. - Left atrium: The atrium was moderately dilated. - Right atrium: The atrium was mildly dilated. - Technically difficult study. Echocontrast was used. Lab Results:  Basic Metabolic Panel:  Recent Labs Lab 06/16/15 0449 06/20/15 1430  NA 141 140  K 4.4 4.1  CL 113* 109  CO2 22 25  GLUCOSE 106* 168*  BUN 43* 13  CREATININE 1.23 1.08  CALCIUM 8.8* 9.5    CBC:  Recent Labs Lab 06/21/15 0209 06/21/15 1557 06/22/15 0450  WBC 7.8 8.1 7.9  HGB 8.1* 8.5* 10.5*  HCT 26.4* 28.5* 33.2*  MCV 81.0 81.7 82.2  PLT 250 229 200    Cardiac Enzymes:  Recent Labs Lab 06/21/15 0209 06/21/15 0824 06/21/15 1556  TROPONINI 2.16* 2.37* 2.26*     Coagulation:  Recent Labs Lab 06/20/15 0635 06/21/15 0209 06/22/15 0450  INR 1.70* 1.40 1.51*     Medications:   Scheduled Medications: . sodium chloride   Intravenous Once  . allopurinol  300 mg Oral Daily  . cholecalciferol  2,000 Units Oral Daily  . insulin aspart  0-15 Units Subcutaneous TID WC  . insulin aspart  0-5 Units Subcutaneous QHS  . insulin aspart  4 Units Subcutaneous TID WC  . insulin detemir  10 Units Subcutaneous QHS  . metoprolol succinate  25 mg Oral BID  .  morphine injection  2 mg Intravenous Once  . omega-3 acid ethyl esters  1 g Oral BID  . pantoprazole  40 mg Oral BID AC  . potassium  chloride SA  20 mEq Oral Daily  . rosuvastatin  20 mg Oral Daily  . warfarin  5 mg Oral Once  . Warfarin - Pharmacist Dosing Inpatient   Does not apply Q24H    Infusions: . heparin 1,350 Units/hr (06/22/15 0625)  . nitroGLYCERIN 20 mcg/min (06/21/15 2148)    PRN Medications: acetaminophen **OR** acetaminophen, alum & mag hydroxide-simeth, ipratropium-albuterol, morphine injection, nitroGLYCERIN, ondansetron **OR** ondansetron (ZOFRAN) IV, oxyCODONE-acetaminophen **AND** oxyCODONE   Assessment and Plan:   1. NSTEMI: Chest pain has resolved. He  remains on NTG gtt at '5mg'$ /hr. I will place him on po nitrates, isosorbide 30 mg daily and stop gtt. Troponin trending down now from highest at 2.37 to 2.26 on 06/21/2015. Continue  metroprolol XL 25 mg bid. He is bradycardic without pauses overnight. Will continue to monitor.Continue statin.    2. CAD: Per Dr. Nelly Laurence note. No plans for ischemic testing or invasive procedures with co-morbidities and inability to place on antiplatelet therapy with GIB on Plavix. Continue conservative therapy.   3. St. Jude Aortic Valve: Continues on heparin bridging and coumadin per pharmacy. His INR this am 1.51.     Phill Myron. Lawrence NP [place]  06/22/2015, 7:52 AM   Patient seen and discussed with NP Purcell Nails, I agree with her documentation above.NSTEMI with peak troponin 2.37, trending down. Chest pain has resolved, he is off NG drip,  he remains hemodynamically stable. Echo shows stable function. Due to recurrent GI bleeds when being on both coumadin and antiplatelet agents we elected to manage medically as opposed to cath. Would continue heparin additional 24 hours. Hgb has been transfused to 10 in setting of ACS and remains stable. Continue crestor 20 and toprol. We will restart his ACE-I. Have not started antiplatelet agent due to recurrent GI bleeds when combined with coumadin, including this admit.    Zandra Abts MD

## 2015-06-22 NOTE — Progress Notes (Signed)
ANTICOAGULATION CONSULT NOTE   Pharmacy Consult for Heparin Indication: chest pain/ACS  No Known Allergies  Patient Measurements: Height: '5\' 10"'$  (177.8 cm) Weight: 202 lb 6.1 oz (91.8 kg) IBW/kg (Calculated) : 73 HEPARIN DW (KG): 91.3  Vital Signs: Temp: 97.7 F (36.5 C) (11/03 0400) Temp Source: Oral (11/03 0400) BP: 128/53 mmHg (11/03 0400) Pulse Rate: 31 (11/03 0400)  Labs:  Recent Labs  06/20/15 0635  06/20/15 1430  06/21/15 0209 06/21/15 0824 06/21/15 1556 06/21/15 1557 06/22/15 0450  HGB  --   --  9.4*  < > 8.1*  --   --  8.5* 10.5*  HCT  --   --  30.9*  < > 26.4*  --   --  28.5* 33.2*  PLT  --   < > 247  --  250  --   --  229 200  LABPROT 19.9*  --   --   --  17.2*  --   --   --  18.2*  INR 1.70*  --   --   --  1.40  --   --   --  1.51*  HEPARINUNFRC  --   --   --   --   --   --  0.15*  --  0.64  CREATININE  --   --  1.08  --   --   --   --   --   --   TROPONINI  --   --  0.05*  < > 2.16* 2.37* 2.26*  --   --   < > = values in this interval not displayed.  Estimated Creatinine Clearance: 68.3 mL/min (by C-G formula based on Cr of 1.08).  Medical History: Past Medical History  Diagnosis Date  . Type 2 diabetes mellitus ([place]) 2007  . Essential hypertension   . Gout   . Hypercholesteremia   . Peripheral vascular disease ([place])     Lower extremity PCI/stenting  . COPD (chronic obstructive pulmonary disease) ([place])   . S/P aortic valve replacement 1990    a. St. Jude  . Chronic back pain   . Dysphagia   . Neuromuscular disorder ([place])   . Peripheral neuropathy ([place])   . Critical lower limb ischemia   . CAD (coronary artery disease)     a. 05/13/14 [name] s/p overlapping DESx2 to SVG to RCA. b.  s/p CABG '90 with redo '94 & stent to RCA SVG in 2005  . Chronic toe ulcer ([place])     a. Left foot  . Chronic systolic heart failure ([place])   . History of kidney stones   . GERD (gastroesophageal reflux disease)   . Arthritis   . Sleep apnea   . Myocardial  infarction ([place])   . GI bleeding 05/31/2015    Source not identified.  . Acute blood loss anemia 02/2015 & 05/2015    Hemoglobin 5.5 on 05/31/15; status post transfusion.   Assessment/Plan:  GI bleed: Moderate to severe gastritis. Coumadin to be resumed but now with positive troponins. Spoke with GI who says we can start heparin if needed.  Heparin started for ACS.    Heparin level is now therapeutic.  INR is SUBtherapeutic. CBC is stable, pt being transfused.  Goal of Therapy:  Heparin level 0.3-0.7 units/ml Monitor platelets and H/H per pharmacy protocol : Yes   Plan:  Continue Heparin infusion at 1350 units/hr Heparin level daily CBC daily while on Heparin Close monitoring for s/sx of bleeding complications  Dean,  Ramie Palladino A 06/22/2015,7:40 AM

## 2015-06-23 LAB — HEPARIN LEVEL (UNFRACTIONATED): HEPARIN UNFRACTIONATED: 0.76 [IU]/mL — AB (ref 0.30–0.70)

## 2015-06-23 LAB — CBC
HCT: 33.2 % — ABNORMAL LOW (ref 39.0–52.0)
HEMOGLOBIN: 10.4 g/dL — AB (ref 13.0–17.0)
MCH: 26.1 pg (ref 26.0–34.0)
MCHC: 31.3 g/dL (ref 30.0–36.0)
MCV: 83.2 fL (ref 78.0–100.0)
Platelets: 231 10*3/uL (ref 150–400)
RBC: 3.99 MIL/uL — AB (ref 4.22–5.81)
RDW: 19.2 % — ABNORMAL HIGH (ref 11.5–15.5)
WBC: 7.1 10*3/uL (ref 4.0–10.5)

## 2015-06-23 LAB — BASIC METABOLIC PANEL
ANION GAP: 4 — AB (ref 5–15)
BUN: 13 mg/dL (ref 6–20)
CHLORIDE: 110 mmol/L (ref 101–111)
CO2: 25 mmol/L (ref 22–32)
CREATININE: 1 mg/dL (ref 0.61–1.24)
Calcium: 9.4 mg/dL (ref 8.9–10.3)
GFR calc non Af Amer: >60 mL/min (ref >60)
Glucose, Bld: 147 mg/dL — ABNORMAL HIGH (ref 65–99)
POTASSIUM: 3.9 mmol/L (ref 3.5–5.1)
SODIUM: 139 mmol/L (ref 135–145)

## 2015-06-23 LAB — GLUCOSE, CAPILLARY
GLUCOSE-CAPILLARY: 125 mg/dL — AB (ref 65–99)
Glucose-Capillary: 157 mg/dL — ABNORMAL HIGH (ref 65–99)

## 2015-06-23 LAB — PROTIME-INR
INR: 1.54 — AB (ref 0.00–1.49)
PROTHROMBIN TIME: 18.5 s — AB (ref 11.6–15.2)

## 2015-06-23 MED ORDER — INSULIN DETEMIR 100 UNIT/ML ~~LOC~~ SOLN: 10.0000 [IU] | Freq: Every day | SUBCUTANEOUS | Status: DC

## 2015-06-23 MED ORDER — WARFARIN SODIUM 5 MG PO TABS: 5.0000 mg | ORAL_TABLET | Freq: Once | ORAL | Status: DC

## 2015-06-23 MED ORDER — METOPROLOL SUCCINATE ER 25 MG PO TB24
25.0000 mg | ORAL_TABLET | Freq: Every day | ORAL | Status: DC
  Administered 2015-06-23: 25 mg via ORAL

## 2015-06-23 MED ORDER — HEPARIN SODIUM (PORCINE) 5000 UNIT/ML IJ SOLN: 5000.0000 [IU] | Freq: Three times a day (TID) | INTRAMUSCULAR | Status: DC

## 2015-06-23 MED ORDER — PANTOPRAZOLE SODIUM 40 MG PO TBEC: 40.0000 mg | DELAYED_RELEASE_TABLET | Freq: Two times a day (BID) | ORAL | Status: DC

## 2015-06-23 MED ORDER — METOPROLOL SUCCINATE ER 25 MG PO TB24: 25.0000 mg | ORAL_TABLET | Freq: Every day | ORAL | Status: DC

## 2015-06-23 NOTE — Discharge Summary (Addendum)
Physician Discharge Summary  Tanner Pena IZT:[id number] DOB: [birth year] DOA: 06/14/2015  PCP: [name], MD  Admit date: 06/14/2015 Discharge date: 06/23/2015  Time spent: 45 minutes  Recommendations for Outpatient Follow-up:  -Will be discharged home today. -Needs follow up with his coumadin clinic in 3 days to monitor INR and make appropriate changes to coumadin dosing. -Follow up with his cardiologist as scheduled. -Follow up with PCP in 2 weeks.   Discharge Diagnoses:  Principal Problem:   GI bleed Active Problems:   H/O aortic valve replacement- St Jude   PVD prior SFA PTA with chronic LE disease, not amenable to PTA   DM2 (diabetes mellitus, type 2) (HCC)   Hyperlipidemia with target LDL less than 70   COPD (chronic obstructive pulmonary disease) (HCC)   NSTEMI (non-ST elevated myocardial infarction) (HCC)   Chronic anticoagulation   Acute blood loss anemia   Chronic systolic heart failure (HCC)   Coronary artery disease involving native coronary artery of native heart with angina pectoris (HCC)   Essential hypertension   History of coronary artery stent placement   Anticoagulation management encounter   Bleeding gastrointestinal   Melena   Absolute anemia   GIB (gastrointestinal bleeding)   Gastritis and gastroduodenitis   Discharge Condition: Stable and improved  Filed Weights   06/21/15 0500 06/22/15 0500 06/23/15 0500  Weight: 88.7 kg (195 lb 8.8 oz) 91.8 kg (202 lb 6.1 oz) 91.8 kg (202 lb 6.1 oz)    History of present illness:  As per Dr. Arnoldo Morale 10/26: Tanner Pena is a 74 y.o. 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 His daughter called his PCP and he was advised to come to the ED. He had a recent hospitalization(10/11-10/14/2016) for GI bleeding and underwent a GI workup and had to be  transfused 3 units due to a hemoglobin level of 5.5, and on discharge 06/02/2015 his hemoglobin was 8.1, today his initial Hb = 7.3. He had an outpatient capsule endoscopy which revealed an ulcer in the Small bowel however he has take his antiplatelet Rx due to his cardiac stents, and his Anticoagulant Rx due to his mechanical AVR. His INR today is 2.99. And his Coumadin has been placed on hold and he is being transfused 1 unit of blood and being place on IV Protonix Rx. He is being admitted to the [facility].   Hospital Course:   Upper GI Bleed/Acute Blood Loss Anemia -Due to erosive gastritis noted on EGD. -Continue BID PPI. -Has been transfused to a Hb of 10.5 in the setting of ACS. -No further bleeding noted.  NSTEMI -Troponins now trending down from a high of 2.37. -Seen by cards: started on a heparin drip; no plans for cath as we cannot commit to uninterrupted plavix for 1 year given his concomitant GI bleed. -Completed 48 hours of a heparin drip. -Plan for medical management. -Limited ECHO shows stable function with EF of 40%.. -Has been transfused to a Hb of 10. -Continue statin/beta blocker. -Given his h/o bleeding no ASA or plavix; just coumadin given his mechanical AVR.  AVR on Chronic Anticoagulation -Coumadin has been restarted.  -INR 1.51. -Goal INR 2.5-3.5. -Will follow up with his coumadin clinic on Monday (3 days post DC).  PVD s/p Left BKA -Noted.  ARF -Resolved.  DM II -Much  improved control. -Continue to adjust regimen as required.  Chronic Systolic CHF -ECHO 08/6107 with EF of 40%. -Compensated.  Essential HTN -Fair control.  Procedures: EGD:  ENDOSCOPIC IMPRESSION:  1. UGI BLEED TO TO MODERATE TO SEVERE GASTRITIS  Consultations:  GI  Cardiology  Discharge Instructions  Discharge Instructions    Diet - low sodium heart healthy    Complete by:  As directed      Increase activity slowly    Complete by:  As directed               Medication List    STOP taking these medications        clopidogrel 75 MG tablet  Commonly known as:  PLAVIX      TAKE these medications        albuterol 4 MG tablet  Commonly known as:  PROVENTIL  Take 4 mg by mouth 3 (three) times daily.     albuterol-ipratropium 18-103 MCG/ACT inhaler  Commonly known as:  COMBIVENT  Inhale 1 puff into the lungs 4 (four) times daily. Coughing/ Shortness of Breath     allopurinol 300 MG tablet  Commonly known as:  ZYLOPRIM  Take 300 mg by mouth daily.     cholecalciferol 1000 UNITS tablet  Commonly known as:  VITAMIN D  Take 2,000 Units by mouth daily.     eucerin lotion  Apply 10 mLs topically as needed for dry skin.     fenofibrate 145 MG tablet  Commonly known as:  TRICOR  TAKE 1 TABLET BY MOUTH ONCE DAILY FOR CHOLESTEROL.     ferrous sulfate 325 (65 FE) MG EC tablet  Take 1 tablet (325 mg total) by mouth daily with breakfast.     fish oil-omega-3 fatty acids 1000 MG capsule  Take 1 capsule (1 g total) by mouth 2 (two) times daily.     furosemide 40 MG tablet  Commonly known as:  LASIX  Take 1 tablet (40 mg total) by mouth daily. RESTART ON SATURDAY, 09/03/14.     glimepiride 1 MG tablet  Commonly known as:  AMARYL  Take 1 mg by mouth 2 (two) times daily.     insulin detemir 100 UNIT/ML injection  Commonly known as:  LEVEMIR  Inject 0.1 mLs (10 Units total) into the skin at bedtime.     isosorbide mononitrate 30 MG 24 hr tablet  Commonly known as:  IMDUR  TAKE ONE TABLET BY MOUTH ONCE DAILY.     metoprolol succinate 25 MG 24 hr tablet  Commonly known as:  TOPROL-XL  Take 1 tablet (25 mg total) by mouth daily.     nitroGLYCERIN 0.4 MG SL tablet  Commonly known as:  NITROSTAT  Place 1 tablet (0.4 mg total) under the tongue every 5 (five) minutes as needed for chest pain.     oxyCODONE-acetaminophen 10-325 MG tablet  Commonly known as:  PERCOCET  Take 1 tablet by mouth every 4 (four) hours as needed for pain.      pantoprazole 40 MG tablet  Commonly known as:  PROTONIX  Take 1 tablet (40 mg total) by mouth 2 (two) times daily before a meal.     potassium chloride SA 20 MEQ tablet  Commonly known as:  K-DUR,KLOR-CON  Take 20 mEq by mouth daily.     pregabalin 50 MG capsule  Commonly known as:  LYRICA  Take one capsule by mouth twice daily for pains     ramipril 2.5 MG capsule  Commonly known as:  ALTACE  Take 1 capsule (2.5 mg total) by mouth 2 (two) times daily.     rosuvastatin 20 MG tablet  Commonly known as:  CRESTOR  TAKE (1) TABLET BY MOUTH AT BEDTIME FOR CHOLESTEROL.     sitaGLIPtin 50 MG tablet  Commonly known as:  JANUVIA  Take 50 mg by mouth daily.     vitamin C 500 MG tablet  Commonly known as:  ASCORBIC ACID  Take 500 mg by mouth daily.     warfarin 5 MG tablet  Commonly known as:  COUMADIN  Take 1 tablet by mouth daily or as directed by coumadin clinic.       No Known Allergies     Follow-up Information    Follow up with [name], MD. Schedule an appointment as soon as possible for a visit in 3 days.   Specialty:  Internal Medicine   Why:  for your coumadin check   Contact information:   [address] [place] [address] [phone]        The results of significant diagnostics from this hospitalization (including imaging, microbiology, ancillary and laboratory) are listed below for reference.    Significant Diagnostic Studies: [place] Art/ven Flow Abd Pelv Doppler  06/06/2015  CLINICAL DATA:  74 year old male with a history of chronic mesenteric ischemia. Status post superior mesenteric artery stent March 06, 2015. He presents today for baseline mesenteric duplex and evaluation of superior mesenteric artery stent. NPO status has been recommended. The patient is not NPO. Cardiovascular risk factors include hypertension, known coronary artery disease, known vascular disease, diabetes, history of tobacco use EXAM: MESENTERIC ARTERIAL DUPLEX  TECHNIQUE: Color and duplex Doppler ultrasound was performed to evaluate the mesenteric arteries. COMPARISON:  Angiogram 03/06/2015, CT 03/02/2015, 03/01/2015 FINDINGS: Celiac artery:  Bowel gas obscures visualization Splenic artery:  Bowel gas obscures visualization Hepatic artery:  Bowel gas obscures visualization Proximal velocity 73 centimeter/second Proximal Aorta:  2.7 cm ; 75 centimeter/second velocity Mid aorta:  2.2 cm Distal aorta:  1.9 cm IMPRESSION: Limited mesenteric duplex as the bowel gas limits visualization of the celiac artery and superior mesenteric artery. SMA stent is not evaluated on this study. No evidence of abdominal aortic aneurysm. Signed, Dulcy Fanny. Earleen Newport, DO [facility] Electronically Signed   By: Corrie Mckusick D.O.   On: 06/06/2015 14:19   Dg Chest Port 1 View  06/15/2015  CLINICAL DATA:  Abdominal pain.  Admitted for recurrent GI bleed. EXAM: PORTABLE CHEST 1 VIEW COMPARISON:  Radiograph 05/30/2015 FINDINGS: Sternotomy wires overlie stable enlarged cardiac silhouette. Chronic elevation LEFT hemidiaphragm. No effusion infiltrate pneumothorax. IMPRESSION: No acute cardiopulmonary findings. Cardiomegaly and elevation of LEFT hemidiaphragm Electronically Signed   By: Suzy Bouchard M.D.   On: 06/15/2015 16:05   Dg Chest Portable 1 View  05/30/2015  CLINICAL DATA:  EKG changes, hypertension, diabetes EXAM: PORTABLE CHEST 1 VIEW COMPARISON:  02/04/2015 FINDINGS: Mild right basilar airspace disease which may reflect scarring versus atelectasis. There is no other focal parenchymal opacity. There is no pleural effusion or pneumothorax. There is stable cardiomegaly. There is evidence of prior CABG. The osseous structures are unremarkable. IMPRESSION: 1. Mild right basilar airspace disease which may reflect scarring versus atelectasis. Electronically Signed   By: Kathreen Devoid   On: 05/30/2015 12:12   Dg Abd Portable  1v  06/15/2015  CLINICAL DATA:  Abdominal pain.  Recurrent GI bleeding. EXAM: PORTABLE ABDOMEN - 1 VIEW COMPARISON:  None. FINDINGS:  The bowel gas pattern is normal. Extensive aortic and visceral arterial vascular calcification noted. IMPRESSION: No acute findings. Electronically Signed   By: Earle Gell M.D.   On: 06/15/2015 16:10    Microbiology: Recent Results (from the past 240 hour(s))  MRSA PCR Screening     Status: None   Collection Time: 06/14/15  9:53 PM  Result Value Ref Range Status   MRSA by PCR NEGATIVE NEGATIVE Final    Comment:        The GeneXpert MRSA Assay (FDA approved for NASAL specimens only), is one component of a comprehensive MRSA colonization surveillance program. It is not intended to diagnose MRSA infection nor to guide or monitor treatment for MRSA infections.      Labs: Basic Metabolic Panel:  Recent Labs Lab 06/20/15 1430 06/23/15 0457  NA 140 139  K 4.1 3.9  CL 109 110  CO2 25 25  GLUCOSE 168* 147*  BUN 13 13  CREATININE 1.08 1.00  CALCIUM 9.5 9.4   Liver Function Tests: No results for input(s): AST, ALT, ALKPHOS, BILITOT, PROT, ALBUMIN in the last 168 hours. No results for input(s): LIPASE, AMYLASE in the last 168 hours. No results for input(s): AMMONIA in the last 168 hours. CBC:  Recent Labs Lab 06/20/15 1430 06/20/15 2237 06/21/15 0209 06/21/15 1557 06/22/15 0450 06/23/15 0457  WBC 6.4  --  7.8 8.1 7.9 7.1  HGB 9.4* 8.3* 8.1* 8.5* 10.5* 10.4*  HCT 30.9* 27.2* 26.4* 28.5* 33.2* 33.2*  MCV 80.7  --  81.0 81.7 82.2 83.2  PLT 247  --  250 229 200 231   Cardiac Enzymes:  Recent Labs Lab 06/20/15 1430 06/20/15 2005 06/21/15 0209 06/21/15 0824 06/21/15 1556  TROPONINI 0.05* 0.51* 2.16* 2.37* 2.26*   BNP: BNP (last 3 results)  Recent Labs  12/28/14 0134 02/04/15 0413  BNP 504.3* 593.2*    ProBNP (last 3 results) No results for input(s): PROBNP in the last 8760 hours.  CBG:  Recent Labs Lab 06/22/15 0733  06/22/15 1131 06/22/15 1614 06/22/15 2115 06/23/15 0738  GLUCAP 128* 123* 163* 140* 125*       Signed:  HERNANDEZ ACOSTA,Fredna Stricker  [facility] Pager: [phone] 06/23/2015, 10:41 AM

## 2015-06-23 NOTE — Progress Notes (Signed)
ANTICOAGULATION CONSULT NOTE   Pharmacy Consult for Heparin --> Coumadin (chronic Rx PTA) Indication: NSTEMI, Hx AVR  No Known Allergies  Patient Measurements: Height: '5\' 10"'$  (177.8 cm) Weight: 202 lb 6.1 oz (91.8 kg) IBW/kg (Calculated) : 73  Vital Signs: Temp: 97.5 F (36.4 C) (11/04 0400) Temp Source: Oral (11/04 0000) BP: 150/54 mmHg (11/04 0645) Pulse Rate: 47 (11/04 0645)  Labs:  Recent Labs  06/20/15 1430  06/21/15 0209 06/21/15 0824 06/21/15 1556 06/21/15 1557 06/22/15 0450 06/23/15 0457  HGB 9.4*  < > 8.1*  --   --  8.5* 10.5* 10.4*  HCT 30.9*  < > 26.4*  --   --  28.5* 33.2* 33.2*  PLT 247  --  250  --   --  229 200 231  LABPROT  --   --  17.2*  --   --   --  18.2* 18.5*  INR  --   --  1.40  --   --   --  1.51* 1.54*  HEPARINUNFRC  --   --   --   --  0.15*  --  0.64 0.76*  CREATININE 1.08  --   --   --   --   --   --  1.00  TROPONINI 0.05*  < > 2.16* 2.37* 2.26*  --   --   --   < > = values in this interval not displayed.  Estimated Creatinine Clearance: 73.8 mL/min (by C-G formula based on Cr of 1).  Medical History: Past Medical History  Diagnosis Date  . Type 2 diabetes mellitus ([place]) 2007  . Essential hypertension   . Gout   . Hypercholesteremia   . Peripheral vascular disease ([place])     Lower extremity PCI/stenting  . COPD (chronic obstructive pulmonary disease) ([place])   . S/P aortic valve replacement 1990    a. St. Jude  . Chronic back pain   . Dysphagia   . Neuromuscular disorder ([place])   . Peripheral neuropathy ([place])   . Critical lower limb ischemia   . CAD (coronary artery disease)     a. 05/13/14 [name] s/p overlapping DESx2 to SVG to RCA. b.  s/p CABG '90 with redo '94 & stent to RCA SVG in 2005  . Chronic toe ulcer ([place])     a. Left foot  . Chronic systolic heart failure ([place])   . History of kidney stones   . GERD (gastroesophageal reflux disease)   . Arthritis   . Sleep apnea   . Myocardial infarction ([place])   . GI bleeding  05/31/2015    Source not identified.  . Acute blood loss anemia 02/2015 & 05/2015    Hemoglobin 5.5 on 05/31/15; status post transfusion.   Medications:  Prescriptions prior to admission  Medication Sig Dispense Refill Last Dose  . albuterol (PROVENTIL) 4 MG tablet Take 4 mg by mouth 3 (three) times daily.   06/14/2015 at Unknown time  . albuterol-ipratropium (COMBIVENT) 18-103 MCG/ACT inhaler Inhale 1 puff into the lungs 4 (four) times daily. Coughing/ Shortness of Breath   06/14/2015 at Unknown time  . allopurinol (ZYLOPRIM) 300 MG tablet Take 300 mg by mouth daily.   06/14/2015 at Unknown time  . cholecalciferol (VITAMIN D) 1000 UNITS tablet Take 2,000 Units by mouth daily.   06/14/2015 at Unknown time  . Emollient (EUCERIN) lotion Apply 10 mLs topically as needed for dry skin.   Past Week at Unknown time  . furosemide (LASIX) 40 MG tablet  Take 1 tablet (40 mg total) by mouth daily. RESTART ON SATURDAY, 09/03/14. (Patient taking differently: Take 80 mg by mouth daily. )   06/14/2015 at Unknown time  . glimepiride (AMARYL) 1 MG tablet Take 1 mg by mouth 2 (two) times daily.   06/14/2015 at Unknown time  . nitroGLYCERIN (NITROSTAT) 0.4 MG SL tablet Place 1 tablet (0.4 mg total) under the tongue every 5 (five) minutes as needed for chest pain. 25 tablet 12 unknown  . oxyCODONE-acetaminophen (PERCOCET) 10-325 MG per tablet Take 1 tablet by mouth every 4 (four) hours as needed for pain. (Patient taking differently: Take 1 tablet by mouth every 3 (three) hours as needed for pain. ) 30 tablet 0 06/14/2015 at morning  . potassium chloride SA (K-DUR,KLOR-CON) 20 MEQ tablet Take 20 mEq by mouth daily.     06/14/2015 at Unknown time  . pregabalin (LYRICA) 50 MG capsule Take one capsule by mouth twice daily for pains (Patient taking differently: Take 50 mg by mouth 3 (three) times daily. ) 60 capsule 5 06/14/2015 at Unknown time  . ramipril (ALTACE) 2.5 MG capsule Take 1 capsule (2.5 mg total) by mouth 2  (two) times daily. (Patient taking differently: Take 2.5-5 mg by mouth 2 (two) times daily. Takes '5mg'$  capsule daily in the morning and takes 2.'5mg'$  capsule at bedtime) 60 capsule 3 06/13/2015 at Unknown time  . sitaGLIPtin (JANUVIA) 50 MG tablet Take 50 mg by mouth daily.   06/14/2015 at Unknown time  . vitamin C (ASCORBIC ACID) 500 MG tablet Take 500 mg by mouth daily.   06/14/2015 at Unknown time  . warfarin (COUMADIN) 5 MG tablet Take 1 tablet by mouth daily or as directed by coumadin clinic. (Patient taking differently: Take 2.5-5 mg by mouth. 2.'5mg'$  on Mondays and Fridays only. Takes '5mg'$  on all other day) 30 tablet 1 06/13/2015 at 2030  . ferrous sulfate 325 (65 FE) MG EC tablet Take 1 tablet (325 mg total) by mouth daily with breakfast. (Patient not taking: Reported on 06/08/2015)   Not Taking  . fish oil-omega-3 fatty acids 1000 MG capsule Take 1 capsule (1 g total) by mouth 2 (two) times daily. (Patient not taking: Reported on 06/15/2015)   Completed Course at Unknown time   Assessment: 36 yom PMH of CAD with multiple stents (on Plavix) and mechanical AVR (on Coumadin) presented with melena. Patient had a recent hospitalization(10/11-10/14/2016) for GI bleeding and underwent a GI workup.   INR is SUBtherapeutic.  Pt was transfused due to GIB and need for anticoagulation. Pt on IV Heparin for ACS (may stop today per last [facility] MD note)  CBC appears stable.    Heparin level slightly above goal.  Goal of Therapy:  Heparin level 0.3-0.7 units/ml INR 2.5-3.5 for mechanical valve Monitor platelets by anticoagulation protocol: Yes   Plan:  Reduce Heparin to 1250 units/hr Repeat Heparin Level in 6-8 hours and continue daily monitor. Repeat Coumadin '5mg'$  po today x 1 INR and CBC daily - monitor for s/sx of bleeding  Pricilla Larsson 06/23/2015,8:10 AM

## 2015-06-23 NOTE — Progress Notes (Signed)
Discharge instructions reviewed with patient and patient's daughter at bedside. Patient's daughter states she will make f/u appointment. Patient states he will not take the insulin once he is discharge. Patient states he continue his diabetic medication he was taking before admission and follow up with his Endocrinologist. Informed Dr. Jerilee Hoh of insulin.  Patient taken to lobby via wheelchair.

## 2015-06-23 NOTE — Progress Notes (Signed)
Quick Note:  Patient was notified yesterday of path results. ______

## 2015-06-23 NOTE — Progress Notes (Signed)
Subjective:    No complaints  Objective:   Temp:  [97 F (36.1 C)-98.5 F (36.9 C)] 97.5 F (36.4 C) (11/04 0400) Pulse Rate:  [29-108] 64 (11/04 0800) Resp:  [10-32] 16 (11/04 0800) BP: (120-165)/(35-129) 154/86 mmHg (11/04 0800) SpO2:  [95 %-100 %] 99 % (11/04 0800) Weight:  [202 lb 6.1 oz (91.8 kg)] 202 lb 6.1 oz (91.8 kg) (11/04 0500) Last BM Date: 06/22/15  Filed Weights   06/21/15 0500 06/22/15 0500 06/23/15 0500  Weight: 195 lb 8.8 oz (88.7 kg) 202 lb 6.1 oz (91.8 kg) 202 lb 6.1 oz (91.8 kg)    Intake/Output Summary (Last 24 hours) at 06/23/15 0852 Last data filed at 06/23/15 0828  Gross per 24 hour  Intake 679.97 ml  Output    250 ml  Net 429.97 ml    Telemetry: SR and sinus brady, PVCs  Exam:  General: NAD  Resp: CTAB  Cardiac: RRR, mechanical S2, no m/r/g, no JVD  GI: abdomen soft, NT, ND  MSK: no LE edema  Neuro: no focal deficits  Psych: appropriate affect  Lab Results:  Basic Metabolic Panel:  Recent Labs Lab 06/20/15 1430 06/23/15 0457  NA 140 139  K 4.1 3.9  CL 109 110  CO2 25 25  GLUCOSE 168* 147*  BUN 13 13  CREATININE 1.08 1.00  CALCIUM 9.5 9.4    Liver Function Tests: No results for input(s): AST, ALT, ALKPHOS, BILITOT, PROT, ALBUMIN in the last 168 hours.  CBC:  Recent Labs Lab 06/21/15 1557 06/22/15 0450 06/23/15 0457  WBC 8.1 7.9 7.1  HGB 8.5* 10.5* 10.4*  HCT 28.5* 33.2* 33.2*  MCV 81.7 82.2 83.2  PLT 229 200 231    Cardiac Enzymes:  Recent Labs Lab 06/21/15 0209 06/21/15 0824 06/21/15 1556  TROPONINI 2.16* 2.37* 2.26*    BNP: No results for input(s): PROBNP in the last 8760 hours.  Coagulation:  Recent Labs Lab 06/21/15 0209 06/22/15 0450 06/23/15 0457  INR 1.40 1.51* 1.54*    ECG:   Medications:   Scheduled Medications: . sodium chloride   Intravenous Once  . allopurinol  300 mg Oral Daily  . cholecalciferol  2,000 Units Oral Daily  . insulin aspart  0-15 Units  Subcutaneous TID WC  . insulin aspart  0-5 Units Subcutaneous QHS  . insulin aspart  4 Units Subcutaneous TID WC  . insulin detemir  10 Units Subcutaneous QHS  . isosorbide mononitrate  30 mg Oral Daily  . metoprolol succinate  25 mg Oral BID  .  morphine injection  2 mg Intravenous Once  . omega-3 acid ethyl esters  1 g Oral BID  . pantoprazole  40 mg Oral BID AC  . potassium chloride SA  20 mEq Oral Daily  . ramipril  2.5 mg Oral Daily  . rosuvastatin  20 mg Oral Daily  . warfarin  5 mg Oral Once  . Warfarin - Pharmacist Dosing Inpatient   Does not apply Q24H     Infusions: . heparin 1,250 Units/hr (06/23/15 0823)     PRN Medications:  acetaminophen **OR** acetaminophen, alum & mag hydroxide-simeth, ipratropium-albuterol, morphine injection, nitroGLYCERIN, ondansetron **OR** ondansetron (ZOFRAN) IV, oxyCODONE-acetaminophen **AND** oxyCODONE     Assessment/Plan    1. NSTEMI - NSTEMI with peak troponin 2.37, trending down. Chest pain has resolved, he is off NG drip, he remains hemodynamically stable - Echo shows stable function. Due to recurrent GI bleeds when being on both coumadin and antiplatelet  agents we elected to manage medically as opposed to cath - Hgb has been transfused to 10 in setting of ACS  - Have not started antiplatelet agent due to recurrent GI bleeds when combined with coumadin, including this admit. He has been on heparin over 48 hrs, will d/c. Start Phillips heparin for DVT prophylasix while INR remains subtherapeutic - some low HRs, decrease Toprol XL to '25mg'$  daily.   2. AVR - back on coumadin, INR remains subtherapeutic. Continue coumadin per pharmacy - he does not have to be bridged, heparin for for NSTEMI. Stopping heparin today  3. GI bleed - management pr GI.      Carlyle Dolly, M.D.

## 2015-06-23 NOTE — Care Management Note (Signed)
Case Management Note  Patient Details  Name: Tanner Pena MRN: [id number] Date of Birth: Apr 12, 1941  Subjective/Objective:                    Action/Plan:   Expected Discharge Date:  06/17/15               Expected Discharge Plan:  Home/Self Care  In-House Referral:  NA  Discharge planning Services  CM Consult  Post Acute Care Choice:  NA Choice offered to:  NA  DME Arranged:    DME Agency:     HH Arranged:    [place] Agency:     Status of Service:  Completed, signed off  Medicare Important Message Given:  Yes-third notification given Date Medicare IM Given:    Medicare IM give by:    Date Additional Medicare IM Given:    Additional Medicare Important Message give by:     If discussed at [place] of Stay Meetings, dates discussed:    Additional Comments: Pt being discharged home today with self care. No CM needs. Will have INR checked on Monday in OP lab.    Sherald Barge, RN 06/23/2015, 11:45 AM

## 2015-06-23 NOTE — Care Management Important Message (Signed)
Important Message  Patient Details  Name: Tanner Pena MRN: [id number] Date of Birth: August 16, 1941   Medicare Important Message Given:  Yes-third notification given    Sherald Barge, RN 06/23/2015, 11:45 AM

## 2015-06-26 ENCOUNTER — Other Ambulatory Visit: Payer: Self-pay | Admitting: Cardiovascular Disease

## 2015-06-26 DIAGNOSIS — Z7901 Long term (current) use of anticoagulants: Secondary | ICD-10-CM | POA: Diagnosis not present

## 2015-06-26 DIAGNOSIS — Z952 Presence of prosthetic heart valve: Secondary | ICD-10-CM | POA: Diagnosis not present

## 2015-06-26 DIAGNOSIS — K922 Gastrointestinal hemorrhage, unspecified: Secondary | ICD-10-CM | POA: Diagnosis not present

## 2015-06-26 DIAGNOSIS — I10 Essential (primary) hypertension: Secondary | ICD-10-CM | POA: Diagnosis not present

## 2015-06-26 DIAGNOSIS — Z681 Body mass index (BMI) 19 or less, adult: Secondary | ICD-10-CM | POA: Diagnosis not present

## 2015-06-26 DIAGNOSIS — Z1389 Encounter for screening for other disorder: Secondary | ICD-10-CM | POA: Diagnosis not present

## 2015-06-26 DIAGNOSIS — I739 Peripheral vascular disease, unspecified: Secondary | ICD-10-CM | POA: Diagnosis not present

## 2015-06-26 DIAGNOSIS — N179 Acute kidney failure, unspecified: Secondary | ICD-10-CM | POA: Diagnosis not present

## 2015-06-26 DIAGNOSIS — J449 Chronic obstructive pulmonary disease, unspecified: Secondary | ICD-10-CM | POA: Diagnosis not present

## 2015-06-26 DIAGNOSIS — I5022 Chronic systolic (congestive) heart failure: Secondary | ICD-10-CM | POA: Diagnosis not present

## 2015-06-26 DIAGNOSIS — D62 Acute posthemorrhagic anemia: Secondary | ICD-10-CM | POA: Diagnosis not present

## 2015-06-26 DIAGNOSIS — Z954 Presence of other heart-valve replacement: Secondary | ICD-10-CM | POA: Diagnosis not present

## 2015-06-26 LAB — PROTIME-INR
INR: 1.9 — ABNORMAL HIGH (ref 0.8–1.2)
Prothrombin Time: 20.6 s — ABNORMAL HIGH (ref 9.1–12.0)

## 2015-06-27 ENCOUNTER — Encounter: Payer: Self-pay | Admitting: *Deleted

## 2015-06-27 ENCOUNTER — Encounter: Payer: Self-pay | Admitting: Cardiovascular Disease

## 2015-06-27 ENCOUNTER — Other Ambulatory Visit: Payer: Self-pay | Admitting: *Deleted

## 2015-06-27 NOTE — Patient Outreach (Signed)
Transition of Care call week #1 Call to patient home, spoke with patient.   Subjective: Patient states "I feel the best I have felt in a long time"  Patient reports he has seen primary care. He has appointment with cardiologist and GI for follow up, daughter, Tanner Pena has appointments.  Patient plans to restart outpatient PT. Patient denies any signs and symptoms of GI bleeding and HF. Patient states he is not weighing daily. Patient states he check blood sugars close to daily but may not be every morning, it may be sometime during the day, "when I remember" or "when I get a notion to take it".  Patient was given rx for insulin at discharge but states they told the doctor or nurse that he was not planning to take the insulin as he planned to go back on oral medication. He reports his blood sugar is "fair" states they run in low 100's states around 110-130s Patient did STOP plavix, he has already followed up for PT/INR and coumadin dose. Patient states daughter continues to manage medications, upon review of medications, patient able to state he is not on plavix and did not take insulin, the rest he reports his daughter will know. Patient referred RNCM to call daughter Tanner Pena for further medication questions.  Assessment: GI Bleed HF DM  Plan:  Patient will let daughter know of RNCM call RNCM call to Tanner Pena, patient daughter,no answer, left message for return Call  [facility] will schedule home visit with patient and daughter, will continue Transition of Care calls Royetta Crochet. Laymond Purser, RN, BSN, [place] [phone]

## 2015-06-28 ENCOUNTER — Ambulatory Visit (INDEPENDENT_AMBULATORY_CARE_PROVIDER_SITE_OTHER): Payer: Medicare Other | Admitting: Pharmacist Clinician (PhC)/ Clinical Pharmacy Specialist

## 2015-06-28 DIAGNOSIS — Z954 Presence of other heart-valve replacement: Secondary | ICD-10-CM

## 2015-06-28 DIAGNOSIS — Z952 Presence of prosthetic heart valve: Secondary | ICD-10-CM

## 2015-06-28 DIAGNOSIS — Z7901 Long term (current) use of anticoagulants: Secondary | ICD-10-CM

## 2015-06-29 ENCOUNTER — Other Ambulatory Visit: Payer: Self-pay | Admitting: *Deleted

## 2015-06-29 NOTE — Patient Outreach (Signed)
Outreach call attempted to daughter, [name] for review of medications. Unable to reach, line busy x2 Plan to attempt again next week. Royetta Crochet. Laymond Purser, RN, BSN, [place] [phone]

## 2015-07-04 ENCOUNTER — Inpatient Hospital Stay (HOSPITAL_COMMUNITY)
Admission: EM | Admit: 2015-07-04 | Discharge: 2015-07-09 | DRG: 377 | Disposition: A | Discharge status: Home / Self Care | Payer: Medicare Other | Attending: Internal Medicine | Admitting: Internal Medicine

## 2015-07-04 ENCOUNTER — Encounter (HOSPITAL_COMMUNITY): Payer: Self-pay | Admitting: Emergency Medicine

## 2015-07-04 DIAGNOSIS — K2970 Gastritis, unspecified, without bleeding: Secondary | ICD-10-CM | POA: Diagnosis present

## 2015-07-04 DIAGNOSIS — I5043 Acute on chronic combined systolic (congestive) and diastolic (congestive) heart failure: Secondary | ICD-10-CM | POA: Diagnosis present

## 2015-07-04 DIAGNOSIS — N179 Acute kidney failure, unspecified: Secondary | ICD-10-CM | POA: Diagnosis present

## 2015-07-04 DIAGNOSIS — E1151 Type 2 diabetes mellitus with diabetic peripheral angiopathy without gangrene: Secondary | ICD-10-CM | POA: Diagnosis not present

## 2015-07-04 DIAGNOSIS — E7800 Pure hypercholesterolemia, unspecified: Secondary | ICD-10-CM | POA: Diagnosis present

## 2015-07-04 DIAGNOSIS — K5900 Constipation, unspecified: Secondary | ICD-10-CM | POA: Diagnosis not present

## 2015-07-04 DIAGNOSIS — E1129 Type 2 diabetes mellitus with other diabetic kidney complication: Secondary | ICD-10-CM | POA: Diagnosis not present

## 2015-07-04 DIAGNOSIS — Z955 Presence of coronary angioplasty implant and graft: Secondary | ICD-10-CM | POA: Diagnosis not present

## 2015-07-04 DIAGNOSIS — Z951 Presence of aortocoronary bypass graft: Secondary | ICD-10-CM | POA: Diagnosis not present

## 2015-07-04 DIAGNOSIS — J441 Chronic obstructive pulmonary disease with (acute) exacerbation: Secondary | ICD-10-CM | POA: Diagnosis present

## 2015-07-04 DIAGNOSIS — E119 Type 2 diabetes mellitus without complications: Secondary | ICD-10-CM

## 2015-07-04 DIAGNOSIS — Z5181 Encounter for therapeutic drug level monitoring: Secondary | ICD-10-CM | POA: Diagnosis not present

## 2015-07-04 DIAGNOSIS — K2901 Acute gastritis with bleeding: Secondary | ICD-10-CM | POA: Diagnosis not present

## 2015-07-04 DIAGNOSIS — I2510 Atherosclerotic heart disease of native coronary artery without angina pectoris: Secondary | ICD-10-CM | POA: Diagnosis not present

## 2015-07-04 DIAGNOSIS — M1990 Unspecified osteoarthritis, unspecified site: Secondary | ICD-10-CM | POA: Diagnosis present

## 2015-07-04 DIAGNOSIS — K219 Gastro-esophageal reflux disease without esophagitis: Secondary | ICD-10-CM | POA: Diagnosis present

## 2015-07-04 DIAGNOSIS — E1122 Type 2 diabetes mellitus with diabetic chronic kidney disease: Secondary | ICD-10-CM | POA: Diagnosis not present

## 2015-07-04 DIAGNOSIS — I110 Hypertensive heart disease with heart failure: Secondary | ICD-10-CM | POA: Diagnosis not present

## 2015-07-04 DIAGNOSIS — D62 Acute posthemorrhagic anemia: Secondary | ICD-10-CM | POA: Diagnosis not present

## 2015-07-04 DIAGNOSIS — IMO0002 Reserved for concepts with insufficient information to code with codable children: Secondary | ICD-10-CM

## 2015-07-04 DIAGNOSIS — Z7901 Long term (current) use of anticoagulants: Secondary | ICD-10-CM | POA: Diagnosis not present

## 2015-07-04 DIAGNOSIS — Z89512 Acquired absence of left leg below knee: Secondary | ICD-10-CM

## 2015-07-04 DIAGNOSIS — Z87891 Personal history of nicotine dependence: Secondary | ICD-10-CM | POA: Diagnosis not present

## 2015-07-04 DIAGNOSIS — K922 Gastrointestinal hemorrhage, unspecified: Secondary | ICD-10-CM | POA: Diagnosis present

## 2015-07-04 DIAGNOSIS — J449 Chronic obstructive pulmonary disease, unspecified: Secondary | ICD-10-CM | POA: Diagnosis present

## 2015-07-04 DIAGNOSIS — I255 Ischemic cardiomyopathy: Secondary | ICD-10-CM | POA: Diagnosis present

## 2015-07-04 DIAGNOSIS — Z952 Presence of prosthetic heart valve: Secondary | ICD-10-CM

## 2015-07-04 DIAGNOSIS — K2990 Gastroduodenitis, unspecified, without bleeding: Secondary | ICD-10-CM | POA: Diagnosis not present

## 2015-07-04 DIAGNOSIS — I214 Non-ST elevation (NSTEMI) myocardial infarction: Secondary | ICD-10-CM | POA: Diagnosis not present

## 2015-07-04 DIAGNOSIS — K921 Melena: Secondary | ICD-10-CM | POA: Diagnosis present

## 2015-07-04 DIAGNOSIS — G4730 Sleep apnea, unspecified: Secondary | ICD-10-CM | POA: Diagnosis present

## 2015-07-04 DIAGNOSIS — D649 Anemia, unspecified: Secondary | ICD-10-CM

## 2015-07-04 DIAGNOSIS — K2971 Gastritis, unspecified, with bleeding: Secondary | ICD-10-CM | POA: Diagnosis not present

## 2015-07-04 LAB — COMPREHENSIVE METABOLIC PANEL
ALT: 13 U/L — ABNORMAL LOW (ref 17–63)
AST: 19 U/L (ref 15–41)
Albumin: 3.4 g/dL — ABNORMAL LOW (ref 3.5–5.0)
Alkaline Phosphatase: 21 U/L — ABNORMAL LOW (ref 38–126)
Anion gap: 8 (ref 5–15)
BUN: 61 mg/dL — ABNORMAL HIGH (ref 6–20)
CALCIUM: 9.3 mg/dL (ref 8.9–10.3)
CHLORIDE: 106 mmol/L (ref 101–111)
CO2: 26 mmol/L (ref 22–32)
Creatinine, Ser: 1.64 mg/dL — ABNORMAL HIGH (ref 0.61–1.24)
GFR, EST AFRICAN AMERICAN: 46 mL/min — AB (ref >60)
GFR, EST NON AFRICAN AMERICAN: 40 mL/min — AB (ref >60)
Glucose, Bld: 136 mg/dL — ABNORMAL HIGH (ref 65–99)
Potassium: 4.9 mmol/L (ref 3.5–5.1)
Sodium: 140 mmol/L (ref 135–145)
Total Bilirubin: 0.5 mg/dL (ref 0.3–1.2)
Total Protein: 5.9 g/dL — ABNORMAL LOW (ref 6.5–8.1)

## 2015-07-04 LAB — CBC
HCT: 28.3 % — ABNORMAL LOW (ref 39.0–52.0)
HEMATOCRIT: 27.8 % — AB (ref 39.0–52.0)
HEMOGLOBIN: 8.7 g/dL — AB (ref 13.0–17.0)
HEMOGLOBIN: 8.7 g/dL — AB (ref 13.0–17.0)
MCH: 25.7 pg — AB (ref 26.0–34.0)
MCH: 26.1 pg (ref 26.0–34.0)
MCHC: 30.7 g/dL (ref 30.0–36.0)
MCHC: 31.3 g/dL (ref 30.0–36.0)
MCV: 83.7 fL (ref 78.0–100.0)
MCV: 83.5 fL (ref 78.0–100.0)
Platelets: 226 10*3/uL (ref 150–400)
Platelets: 221 10*3/uL (ref 150–400)
RBC: 3.38 MIL/uL — AB (ref 4.22–5.81)
RBC: 3.33 MIL/uL — ABNORMAL LOW (ref 4.22–5.81)
RDW: 20.4 % — ABNORMAL HIGH (ref 11.5–15.5)
RDW: 20.6 % — AB (ref 11.5–15.5)
WBC: 8.7 10*3/uL (ref 4.0–10.5)
WBC: 10.3 10*3/uL (ref 4.0–10.5)

## 2015-07-04 LAB — PROTIME-INR
INR: 3.96 — ABNORMAL HIGH (ref 0.00–1.49)
PROTHROMBIN TIME: 37.7 s — AB (ref 11.6–15.2)

## 2015-07-04 LAB — GLUCOSE, CAPILLARY: Glucose-Capillary: 129 mg/dL — ABNORMAL HIGH (ref 65–99)

## 2015-07-04 LAB — POC OCCULT BLOOD, ED: FECAL OCCULT BLD: POSITIVE — AB

## 2015-07-04 LAB — TROPONIN I: TROPONIN I: 0.06 ng/mL — AB (ref <0.031)

## 2015-07-04 MED ORDER — RAMIPRIL 1.25 MG PO CAPS
2.5000 mg | ORAL_CAPSULE | Freq: Two times a day (BID) | ORAL | Status: DC
  Filled 2015-07-04 (×2): qty 2

## 2015-07-04 MED ORDER — OMEGA-3-ACID ETHYL ESTERS 1 G PO CAPS
1.0000 g | ORAL_CAPSULE | Freq: Two times a day (BID) | ORAL | Status: DC
Start: 2015-07-04 — End: 2015-07-09
  Administered 2015-07-04 – 2015-07-09 (×10): 1 g via ORAL
  Filled 2015-07-04 (×10): qty 1

## 2015-07-04 MED ORDER — IPRATROPIUM-ALBUTEROL 18-103 MCG/ACT IN AERO: 1.0000 | INHALATION_SPRAY | Freq: Four times a day (QID) | RESPIRATORY_TRACT | Status: DC

## 2015-07-04 MED ORDER — METOPROLOL SUCCINATE ER 25 MG PO TB24: 25.0000 mg | ORAL_TABLET | Freq: Every day | ORAL | Status: DC

## 2015-07-04 MED ORDER — HYDROCERIN EX CREA
TOPICAL_CREAM | Freq: Two times a day (BID) | CUTANEOUS | Status: DC | PRN
  Filled 2015-07-04: qty 113

## 2015-07-04 MED ORDER — PANTOPRAZOLE SODIUM 40 MG IV SOLR
40.0000 mg | Freq: Two times a day (BID) | INTRAVENOUS | Status: DC
  Administered 2015-07-04 – 2015-07-08 (×8): 40 mg via INTRAVENOUS
  Filled 2015-07-04 (×8): qty 40

## 2015-07-04 MED ORDER — FENOFIBRATE 160 MG PO TABS
160.0000 mg | ORAL_TABLET | Freq: Every day | ORAL | Status: DC
  Administered 2015-07-05 – 2015-07-09 (×5): 160 mg via ORAL
  Filled 2015-07-04 (×6): qty 1

## 2015-07-04 MED ORDER — ROSUVASTATIN CALCIUM 20 MG PO TABS
20.0000 mg | ORAL_TABLET | Freq: Every day | ORAL | Status: DC
  Administered 2015-07-04 – 2015-07-08 (×5): 20 mg via ORAL
  Filled 2015-07-04 (×5): qty 1

## 2015-07-04 MED ORDER — SODIUM CHLORIDE 0.9 % IV SOLN: INTRAVENOUS | Status: DC

## 2015-07-04 MED ORDER — INSULIN ASPART 100 UNIT/ML ~~LOC~~ SOLN
0.0000 [IU] | SUBCUTANEOUS | Status: DC
  Administered 2015-07-05: 2 [IU] via SUBCUTANEOUS
  Administered 2015-07-05: 5 [IU] via SUBCUTANEOUS
  Administered 2015-07-05 (×2): 2 [IU] via SUBCUTANEOUS
  Administered 2015-07-05: 3 [IU] via SUBCUTANEOUS
  Administered 2015-07-06 (×2): 2 [IU] via SUBCUTANEOUS
  Administered 2015-07-06: 1 [IU] via SUBCUTANEOUS
  Administered 2015-07-06 – 2015-07-07 (×3): 3 [IU] via SUBCUTANEOUS
  Administered 2015-07-07: 2 [IU] via SUBCUTANEOUS
  Administered 2015-07-07 (×3): 3 [IU] via SUBCUTANEOUS
  Administered 2015-07-07: 5 [IU] via SUBCUTANEOUS
  Administered 2015-07-08: 8 [IU] via SUBCUTANEOUS
  Administered 2015-07-08: 5 [IU] via SUBCUTANEOUS
  Administered 2015-07-08 (×2): 3 [IU] via SUBCUTANEOUS
  Administered 2015-07-08 (×2): 5 [IU] via SUBCUTANEOUS
  Administered 2015-07-09: 3 [IU] via SUBCUTANEOUS
  Administered 2015-07-09: 5 [IU] via SUBCUTANEOUS
  Administered 2015-07-09: 2 [IU] via SUBCUTANEOUS

## 2015-07-04 MED ORDER — PREGABALIN 50 MG PO CAPS
50.0000 mg | ORAL_CAPSULE | Freq: Three times a day (TID) | ORAL | Status: DC
  Administered 2015-07-04 – 2015-07-09 (×14): 50 mg via ORAL
  Filled 2015-07-04: qty 2
  Filled 2015-07-04: qty 1
  Filled 2015-07-04: qty 2
  Filled 2015-07-04 (×5): qty 1
  Filled 2015-07-04: qty 2
  Filled 2015-07-04: qty 1
  Filled 2015-07-04: qty 2
  Filled 2015-07-04: qty 1
  Filled 2015-07-04: qty 2
  Filled 2015-07-04: qty 1

## 2015-07-04 MED ORDER — SODIUM CHLORIDE 0.9 % IV BOLUS (SEPSIS)
500.0000 mL | Freq: Once | INTRAVENOUS | Status: AC
  Administered 2015-07-04: 500 mL via INTRAVENOUS

## 2015-07-04 MED ORDER — WARFARIN - PHARMACIST DOSING INPATIENT
Status: DC
  Administered 2015-07-08: 1

## 2015-07-04 MED ORDER — OXYCODONE-ACETAMINOPHEN 5-325 MG PO TABS
1.0000 | ORAL_TABLET | ORAL | Status: DC | PRN
Start: 2015-07-04 — End: 2015-07-09
  Administered 2015-07-05 – 2015-07-09 (×6): 1 via ORAL
  Filled 2015-07-04 (×6): qty 1

## 2015-07-04 MED ORDER — METOPROLOL SUCCINATE ER 50 MG PO TB24
50.0000 mg | ORAL_TABLET | Freq: Every day | ORAL | Status: DC
  Administered 2015-07-05 – 2015-07-09 (×5): 50 mg via ORAL
  Filled 2015-07-04 (×5): qty 1

## 2015-07-04 MED ORDER — IPRATROPIUM-ALBUTEROL 0.5-2.5 (3) MG/3ML IN SOLN
3.0000 mL | Freq: Four times a day (QID) | RESPIRATORY_TRACT | Status: DC
  Administered 2015-07-05: 3 mL via RESPIRATORY_TRACT
  Filled 2015-07-04: qty 3

## 2015-07-04 MED ORDER — OXYCODONE HCL 5 MG PO TABS
5.0000 mg | ORAL_TABLET | ORAL | Status: DC | PRN
  Administered 2015-07-05 – 2015-07-09 (×6): 5 mg via ORAL
  Filled 2015-07-04 (×6): qty 1

## 2015-07-04 MED ORDER — POTASSIUM CHLORIDE CRYS ER 20 MEQ PO TBCR
20.0000 meq | EXTENDED_RELEASE_TABLET | Freq: Every day | ORAL | Status: DC
  Administered 2015-07-05 – 2015-07-09 (×5): 20 meq via ORAL
  Filled 2015-07-04 (×5): qty 1

## 2015-07-04 MED ORDER — OXYCODONE-ACETAMINOPHEN 10-325 MG PO TABS: 1.0000 | ORAL_TABLET | ORAL | Status: DC | PRN

## 2015-07-04 MED ORDER — ALLOPURINOL 300 MG PO TABS
300.0000 mg | ORAL_TABLET | Freq: Every day | ORAL | Status: DC
  Administered 2015-07-05 – 2015-07-09 (×5): 300 mg via ORAL
  Filled 2015-07-04 (×5): qty 1

## 2015-07-04 MED ORDER — SODIUM CHLORIDE 0.9 % IJ SOLN
3.0000 mL | Freq: Two times a day (BID) | INTRAMUSCULAR | Status: DC
  Administered 2015-07-04 – 2015-07-09 (×8): 3 mL via INTRAVENOUS

## 2015-07-04 MED ORDER — EUCERIN EX LOTN: 10.0000 mL | TOPICAL_LOTION | CUTANEOUS | Status: DC | PRN

## 2015-07-04 MED ORDER — VITAMIN D 1000 UNITS PO TABS
2000.0000 [IU] | ORAL_TABLET | Freq: Every day | ORAL | Status: DC
Start: 2015-07-05 — End: 2015-07-09
  Administered 2015-07-05 – 2015-07-09 (×5): 2000 [IU] via ORAL
  Filled 2015-07-04 (×5): qty 2

## 2015-07-04 MED ORDER — ALBUTEROL SULFATE 2 MG PO TABS
4.0000 mg | ORAL_TABLET | Freq: Three times a day (TID) | ORAL | Status: DC
  Administered 2015-07-05 – 2015-07-09 (×12): 4 mg via ORAL
  Filled 2015-07-04: qty 1
  Filled 2015-07-04 (×9): qty 2
  Filled 2015-07-04: qty 1
  Filled 2015-07-04 (×6): qty 2

## 2015-07-04 MED ORDER — ISOSORBIDE MONONITRATE ER 60 MG PO TB24
30.0000 mg | ORAL_TABLET | Freq: Every day | ORAL | Status: DC
  Administered 2015-07-05 – 2015-07-09 (×5): 30 mg via ORAL
  Filled 2015-07-04 (×5): qty 1

## 2015-07-04 NOTE — ED Notes (Signed)
Pt states that he has had 4 episodes of dark black stool mixed with bright red blood.

## 2015-07-04 NOTE — ED Notes (Signed)
Report given to [facility] in ICU

## 2015-07-04 NOTE — H&P (Signed)
[facility] History and Physical  Tanner Pena SWH:[id number] DOB: 10-04-1940    PCP:   [name], MD   Chief Complaint: Melanonic stool and anemia.   HPI:  Tanner Pena is a 74 y.o. 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  He denied CP, SOB, lightheadedness or diaphoresis.  He was found to have a Hb of 8.7, and normal hemodynamics, with INR of 3.96. He was given IVF and hospitalist was asked to admit him for upper GI Bleed.   Rewiew of Systems:  Constitutional: Negative for malaise, fever and chills. No significant weight loss or weight gain Eyes: Negative for eye pain, redness and discharge, diplopia, visual changes, or flashes of light. ENMT: Negative for ear pain, hoarseness, nasal congestion, sinus pressure and sore throat. No headaches; tinnitus, drooling, or problem swallowing. Cardiovascular: Negative for chest pain, palpitations, diaphoresis, dyspnea and peripheral edema. ; No orthopnea, PND Respiratory: Negative for cough, hemoptysis, wheezing and stridor. No pleuritic chestpain. Gastrointestinal: Negative for nausea, vomiting, diarrhea, constipation, abdominal pain, blood in stool, hematemesis, jaundice and rectal bleeding.    Genitourinary: Negative for frequency, dysuria, incontinence,flank pain and hematuria; Musculoskeletal: Negative for back pain and neck pain. Negative for swelling and trauma.;  Skin: . Negative for pruritus, rash, abrasions, bruising and skin lesion.; ulcerations Neuro: Negative for headache, lightheadedness and neck stiffness. Negative for weakness, altered level of consciousness , altered mental status, extremity weakness, burning feet, involuntary movement, seizure and syncope.  Psych:  negative for anxiety, depression, insomnia, tearfulness, panic attacks, hallucinations, paranoia, suicidal or homicidal ideation    Past Medical History  Diagnosis Date  . Type 2 diabetes mellitus ([place]) 2007  . Essential hypertension   . Gout   . Hypercholesteremia   . Peripheral vascular disease ([place])     Lower extremity PCI/stenting  . COPD (chronic obstructive pulmonary disease) ([place])   . S/P aortic valve replacement 1990    a. St. Jude  . Chronic back pain   . Dysphagia   . Neuromuscular disorder ([place])   . Peripheral neuropathy ([place])   . Critical lower limb ischemia   . CAD (coronary artery disease)     a. 05/13/14 [name] s/p overlapping DESx2 to SVG to RCA. b.  s/p CABG '90 with redo '94 & stent to RCA SVG in 2005  . Chronic toe ulcer ([place])     a. Left foot  . Chronic systolic heart failure ([place])   . History of kidney stones   . GERD (gastroesophageal reflux disease)   . Arthritis   . Sleep apnea   . Myocardial infarction ([place])   . GI bleeding 05/31/2015    Source not identified.  . Acute blood loss anemia 02/2015 & 05/2015    Hemoglobin 5.5 on 05/31/15; status post transfusion.  . CHF (congestive heart failure) [facility])     Past Surgical History  Procedure Laterality Date  . Aortic valve replacement  1990    St. Jude  . Rotator cuff repair      right  . Cataract extraction      bilateral  . Coronary stent placement  2005    RCA vein graft A 3.0x13.0 TAXUS stent was then placed int he vessel a Viva 3.0x4.0 (  perfusion balloon was made ready it was placed through the entire lenght of the stent  . Peripheral vascular procedures lower extremities      right external iliac  artery PTA and stenting as well as bilateral SFA intervention remotely. Repeat procedures in 2011 bilaterally  . Coronary artery bypass graft  1994    6 vessels  . [name]  06/13/2011    Procedure: Venia Minks DILATION;  Surgeon: [name], MD;  Location: [facility];  Service: Endoscopy;   Laterality: N/A;  Dilated to 56.   . Angioplasty illiac artery    . Back surgery  [id number]    2  . Eye surgery    . Amputation Left 07/13/2014    Procedure: Transmetatarsal Amputation;  Surgeon: [name], MD;  Location: [place];  Service: Orthopedics;  Laterality: Left;  . Lower extremity angiogram N/A 02/15/2013    Procedure: LOWER EXTREMITY ANGIOGRAM;  Surgeon: [name], MD;  Location: [facility] CATH LAB;  Service: Cardiovascular;  Laterality: N/A;  . Left heart catheterization with coronary angiogram N/A 05/11/2014    Procedure: LEFT HEART CATHETERIZATION WITH CORONARY ANGIOGRAM;  Surgeon: [name], MD;  Location: [facility] CATH LAB;  Service: Cardiovascular;  Laterality: N/A;  . Percutaneous coronary stent intervention (pci-s) N/A 05/13/2014    Procedure: PERCUTANEOUS CORONARY STENT INTERVENTION (PCI-S);  Surgeon: [name], MD;  Location: [facility] CATH LAB;  Service: Cardiovascular;  Laterality: N/A;  . Lower extremity angiogram N/A 06/06/2014    Procedure: LOWER EXTREMITY ANGIOGRAM;  Surgeon: [name], MD;  Location: [facility] CATH LAB;  Service: Cardiovascular;  Laterality: N/A;  . Amputation Left 08/20/2014    Procedure: Revision Transmetatarsal Amputation versus Below Knee Amputation;  Surgeon: [name], MD;  Location: [place];  Service: Orthopedics;  Laterality: Left;  . Stump revision Left 09/23/2014    Procedure: Revision Left Below Knee Amputation;  Surgeon: [name], MD;  Location: [place];  Service: Orthopedics;  Laterality: Left;  . Stump revision Left 10/13/2014    Procedure: REVISION LEFT BELOW KNEE AMPUTATION STUMP;  Surgeon: [name], MD;  Location: [facility];  Service: Orthopedics;  Laterality: Left;  . Esophagogastroduodenoscopy N/A 02/09/2015    DR. Schooler: Normal EGD  . Esophagogastroduodenoscopy  05/2011    Dr. [name]: s/p esophageal dilation, antral erosions/nodularity with benign biopsies  . Colonoscopy  05/2011    Dr. [name]: benign  rectal polyp, left sided tics, ascending colonic ulcers (path c/w ischemia)  . Colonoscopy with propofol N/A 06/01/2015    RMR: normal appearing rectal mucosa. Scattered left-sided diverticula. the remainder of the colonic mucosa appeared normal. the distal 5 cm of terminal ileal mucosa also appeared normal. retroflexion was performed.   Freda Munro capsule study N/A 06/08/2015    Procedure: GIVENS CAPSULE STUDY;  Surgeon: [name], MD;  Location: [facility];  Service: Endoscopy;  Laterality: N/A;  0700   . Esophagogastroduodenoscopy (egd) with propofol N/A 06/20/2015    Procedure: ESOPHAGOGASTRODUODENOSCOPY (EGD) WITH PROPOFOL;  Surgeon: Danie Binder, MD;  Location: [facility];  Service: Endoscopy;  Laterality: N/A;  . Esophageal biopsy N/A 06/20/2015    Procedure: BIOPSY;  Surgeon: Danie Binder, MD;  Location: [facility];  Service: Endoscopy;  Laterality: N/A;    Medications:  HOME MEDS: Prior to Admission medications   Medication Sig Start Date End Date Taking? Authorizing Provider  albuterol (PROVENTIL) 4 MG tablet Take 4 mg by mouth 3 (three) times daily. 01/20/15  Yes  Historical Provider, MD  albuterol-ipratropium (COMBIVENT) 18-103 MCG/ACT inhaler Inhale 1 puff into the lungs 4 (four) times daily. Coughing/ Shortness of Breath   Yes Historical Provider, MD  allopurinol (ZYLOPRIM) 300 MG tablet Take 300 mg by mouth daily.   Yes Historical Provider, MD  cholecalciferol (VITAMIN D) 1000 UNITS tablet Take 2,000 Units by mouth daily.   Yes Historical Provider, MD  Emollient (EUCERIN) lotion Apply 10 mLs topically as needed for dry skin.   Yes Historical Provider, MD  fenofibrate (TRICOR) 145 MG tablet TAKE 1 TABLET BY MOUTH ONCE DAILY FOR CHOLESTEROL. 06/22/15  Yes [name], MD  fish oil-omega-3 fatty acids 1000 MG capsule Take 1 capsule (1 g total) by mouth 2 (two) times daily. 01/22/13  Yes Kristin L Alvstad, RPH-CPP  furosemide (LASIX) 40 MG tablet Take 1 tablet (40 mg total) by mouth  daily. RESTART ON SATURDAY, 09/03/14. Patient taking differently: Take 80 mg by mouth daily.  06/02/15  Yes Rexene Alberts, MD  glimepiride (AMARYL) 1 MG tablet Take 1 mg by mouth 2 (two) times daily.   Yes Historical Provider, MD  isosorbide mononitrate (IMDUR) 30 MG 24 hr tablet TAKE ONE TABLET BY MOUTH ONCE DAILY. 06/22/15  Yes Troy Sine, MD  metoprolol succinate (TOPROL-XL) 50 MG 24 hr tablet Take 50 mg by mouth daily. 06/22/15  Yes Historical Provider, MD  nitroGLYCERIN (NITROSTAT) 0.4 MG SL tablet Place 1 tablet (0.4 mg total) under the tongue every 5 (five) minutes as needed for chest pain. 05/17/14  Yes Eileen Stanford, PA-C  oxyCODONE-acetaminophen (PERCOCET) 10-325 MG per tablet Take 1 tablet by mouth every 4 (four) hours as needed for pain. Patient taking differently: Take 1 tablet by mouth every 3 (three) hours as needed for pain.  09/23/14  Yes [name], MD  pantoprazole (PROTONIX) 40 MG tablet Take 1 tablet (40 mg total) by mouth 2 (two) times daily before a meal. 06/23/15  Yes Estela Leonie Green, MD  potassium chloride SA (K-DUR,KLOR-CON) 20 MEQ tablet Take 20 mEq by mouth daily.     Yes Historical Provider, MD  pregabalin (LYRICA) 50 MG capsule Take one capsule by mouth twice daily for pains Patient taking differently: Take 50 mg by mouth 3 (three) times daily.  08/23/14  Yes Mahima Pandey, MD  ramipril (ALTACE) 2.5 MG capsule Take 1 capsule (2.5 mg total) by mouth 2 (two) times daily. Patient taking differently: Take 2.5-5 mg by mouth 2 (two) times daily. Takes '5mg'$  capsule daily in the morning and takes 2.'5mg'$  capsule at bedtime 06/02/15  Yes Rexene Alberts, MD  rosuvastatin (CRESTOR) 20 MG tablet TAKE (1) TABLET BY MOUTH AT BEDTIME FOR CHOLESTEROL. 06/22/15  Yes [name], MD  sitaGLIPtin (JANUVIA) 50 MG tablet Take 50 mg by mouth daily.   Yes Historical Provider, MD  vitamin C (ASCORBIC ACID) 500 MG tablet Take 500 mg by mouth daily.   Yes Historical Provider, MD   warfarin (COUMADIN) 5 MG tablet Take 1 tablet by mouth daily or as directed by coumadin clinic. Patient taking differently: Take 2.5-5 mg by mouth. 2.'5mg'$  on Mondays and Fridays only. Takes '5mg'$  on all other day 06/02/15  Yes Rexene Alberts, MD  ferrous sulfate 325 (65 FE) MG EC tablet Take 1 tablet (325 mg total) by mouth daily with breakfast. Patient not taking: Reported on 06/08/2015 06/02/15   Rexene Alberts, MD  insulin detemir (LEVEMIR) 100 UNIT/ML injection Inject 0.1 mLs (10 Units total) into the skin at bedtime.  Patient not taking: Reported on 06/27/2015 06/23/15   Erline Hau, MD  metoprolol succinate (TOPROL-XL) 25 MG 24 hr tablet Take 1 tablet (25 mg total) by mouth daily. Patient not taking: Reported on 07/04/2015 06/23/15   Erline Hau, MD     Allergies:  No Known Allergies  Social History:   reports that he quit smoking about 13 months ago. His smoking use included Cigarettes. He started smoking about 61 years ago. He has a 55 pack-year smoking history. His smokeless tobacco use includes Chew. He reports that he does not drink alcohol or use illicit drugs.  Family History: Family History  Problem Relation Age of Onset  . Colon cancer Neg Hx   . Liver disease Neg Hx      Physical Exam: Filed Vitals:   07/04/15 1758 07/04/15 1902 07/04/15 1930 07/04/15 2000  BP: 120/53 128/49 123/30 99/45  Pulse: 42 68    Temp: 98 F (36.7 C)     TempSrc: Oral     Resp: '18 20 22 22  '$ Height: '5\' 10"'$  (1.778 m)     Weight: 93.441 kg (206 lb)     SpO2: 100% 100%     Blood pressure 99/45, pulse 68, temperature 98 F (36.7 C), temperature source Oral, resp. rate 22, height '5\' 10"'$  (1.778 m), weight 93.441 kg (206 lb), SpO2 100 %.  GEN:  Pleasant patient lying in the stretcher in no acute distress; cooperative with exam. PSYCH:  alert and oriented x4; does not appear anxious or depressed; affect is appropriate. HEENT: Mucous membranes pink and anicteric; PERRLA;  EOM intact; no cervical lymphadenopathy nor thyromegaly or carotid bruit; no JVD; There were no stridor. Neck is very supple. Breasts:: Not examined CHEST WALL: No tenderness CHEST: Normal respiration, clear to auscultation bilaterally.  HEART: Regular rate and rhythm.  There is a mechanical click at the LSB.  BACK: No kyphosis or scoliosis; no CVA tenderness ABDOMEN: soft and non-tender; no masses, no organomegaly, normal abdominal bowel sounds; no pannus; no intertriginous candida. There is no rebound and no distention. Rectal Exam: Not done EXTREMITIES: No bone or joint deformity; age-appropriate arthropathy of the hands and knees; no edema; no ulcerations.  There is no calf tenderness. Genitalia: not examined PULSES: 2+ and symmetric SKIN: Normal hydration no rash or ulceration CNS: Cranial nerves 2-12 grossly intact no focal lateralizing neurologic deficit.  Speech is fluent; uvula elevated with phonation, facial symmetry and tongue midline. DTR are normal bilaterally, cerebella exam is intact, barbinski is negative and strengths are equaled bilaterally.  No sensory loss.   Labs on Admission:  Basic Metabolic Panel:  Recent Labs Lab 07/04/15 1834  NA 140  K 4.9  CL 106  CO2 26  GLUCOSE 136*  BUN 61*  CREATININE 1.64*  CALCIUM 9.3   Liver Function Tests:  Recent Labs Lab 07/04/15 1834  AST 19  ALT 13*  ALKPHOS 21*  BILITOT 0.5  PROT 5.9*  ALBUMIN 3.4*   CBC:  Recent Labs Lab 07/04/15 1834  WBC 8.7  HGB 8.7*  HCT 28.3*  MCV 83.7  PLT 226   Assessment/Plan . GI bleed CHF AVR, mechanical Erosive gastritis Anticoagulation  Anemia COPD.  PLAN:  Will admit him, type and cross, follow H and H every 6 hours, and transfuse as necessary.  Due to his comorbidity and continued to need anticoagulation due to his mechanical valve, his upper GI bleed, though not active, will require ICU observation admit.  Will give  IV PPI BID, and adjust his coumadin dosing with  help from pharmacy.  Since he r/in last time, will cycle his troponins as well this time.   He is stable, full code, and will be admitted to [facility] service.  He is made NPO.  He was given IVF in the ER, and due to his hx of CHF, will d/c his IVF for now.    Thank you and Good Day.   Other plans as per orders.  Code Status: FULL Haskel Khan, MD. [facility] Pager [phone] 7pm to 7am.  07/04/2015, 8:59 PM

## 2015-07-04 NOTE — ED Provider Notes (Signed)
CSN: [id number]     Arrival date & time 07/04/15  1752 History   First MD Initiated Contact with Patient 07/04/15 1857     Chief Complaint  Patient presents with  . Rectal Bleeding     (Consider location/radiation/quality/duration/timing/severity/associated sxs/prior Treatment) Patient is a 74 y.o. male presenting with hematochezia. The history is provided by the patient.  Rectal Bleeding Associated symptoms: no abdominal pain, no fever, no light-headedness and no vomiting    patient with onset of black bowel movements today. Had about 4-5 bowel movements. One bowel movement had a little bit red blood in. No abdominal pain. Patient did not feel like he was going to pass out did not feel lightheaded. No nausea vomiting. Patient with admission in October for similar problem. Resulted from a GI bleed. Patient was admitted to ICU multiple blood transfusions. Followed by GI as well.  Past Medical History  Diagnosis Date  . Type 2 diabetes mellitus ([place]) 2007  . Essential hypertension   . Gout   . Hypercholesteremia   . Peripheral vascular disease ([place])     Lower extremity PCI/stenting  . COPD (chronic obstructive pulmonary disease) ([place])   . S/P aortic valve replacement 1990    a. St. Jude  . Chronic back pain   . Dysphagia   . Neuromuscular disorder ([place])   . Peripheral neuropathy ([place])   . Critical lower limb ischemia   . CAD (coronary artery disease)     a. 05/13/14 [name] s/p overlapping DESx2 to SVG to RCA. b.  s/p CABG '90 with redo '94 & stent to RCA SVG in 2005  . Chronic toe ulcer ([place])     a. Left foot  . Chronic systolic heart failure ([place])   . History of kidney stones   . GERD (gastroesophageal reflux disease)   . Arthritis   . Sleep apnea   . Myocardial infarction ([place])   . GI bleeding 05/31/2015    Source not identified.  . Acute blood loss anemia 02/2015 & 05/2015    Hemoglobin 5.5 on 05/31/15; status post transfusion.  . CHF (congestive heart failure) [facility])     Past Surgical History  Procedure Laterality Date  . Aortic valve replacement  1990    St. Jude  . Rotator cuff repair      right  . Cataract extraction      bilateral  . Coronary stent placement  2005    RCA vein graft A 3.0x13.0 TAXUS stent was then placed int he vessel a Viva 3.0x4.0 (perfusion balloon was made ready it was placed through the entire lenght of the stent  . Peripheral vascular procedures lower extremities      right external iliac  artery PTA and stenting as well as bilateral SFA intervention remotely. Repeat procedures in 2011 bilaterally  . Coronary artery bypass graft  1994    6 vessels  . [name]  06/13/2011    Procedure: Venia Minks DILATION;  Surgeon: [name], MD;  Location: [facility];  Service: Endoscopy;  Laterality: N/A;  Dilated to 56.   . Angioplasty illiac artery    . Back surgery  3614,4315    2  . Eye surgery    . Amputation Left 07/13/2014    Procedure: Transmetatarsal Amputation;  Surgeon: [name], MD;  Location: [place];  Service: Orthopedics;  Laterality: Left;  . Lower extremity angiogram N/A 02/15/2013    Procedure: LOWER EXTREMITY ANGIOGRAM;  Surgeon: [name], MD;  Location: [facility]  CATH LAB;  Service: Cardiovascular;  Laterality: N/A;  . Left heart catheterization with coronary angiogram N/A 05/11/2014    Procedure: LEFT HEART CATHETERIZATION WITH CORONARY ANGIOGRAM;  Surgeon: [name], MD;  Location: [facility] CATH LAB;  Service: Cardiovascular;  Laterality: N/A;  . Percutaneous coronary stent intervention (pci-s) N/A 05/13/2014    Procedure: PERCUTANEOUS CORONARY STENT INTERVENTION (PCI-S);  Surgeon: [name], MD;  Location: [facility] CATH LAB;  Service: Cardiovascular;  Laterality: N/A;  . Lower extremity angiogram N/A 06/06/2014    Procedure: LOWER EXTREMITY ANGIOGRAM;  Surgeon: [name], MD;  Location: [facility] CATH LAB;  Service: Cardiovascular;  Laterality: N/A;  . Amputation Left 08/20/2014    Procedure:  Revision Transmetatarsal Amputation versus Below Knee Amputation;  Surgeon: [name], MD;  Location: [place];  Service: Orthopedics;  Laterality: Left;  . Stump revision Left 09/23/2014    Procedure: Revision Left Below Knee Amputation;  Surgeon: [name], MD;  Location: [place];  Service: Orthopedics;  Laterality: Left;  . Stump revision Left 10/13/2014    Procedure: REVISION LEFT BELOW KNEE AMPUTATION STUMP;  Surgeon: [name], MD;  Location: [facility];  Service: Orthopedics;  Laterality: Left;  . Esophagogastroduodenoscopy N/A 02/09/2015    DR. Schooler: Normal EGD  . Esophagogastroduodenoscopy  05/2011    Dr. [name]: s/p esophageal dilation, antral erosions/nodularity with benign biopsies  . Colonoscopy  05/2011    Dr. [name]: benign rectal polyp, left sided tics, ascending colonic ulcers (path c/w ischemia)  . Colonoscopy with propofol N/A 06/01/2015    RMR: normal appearing rectal mucosa. Scattered left-sided diverticula. the remainder of the colonic mucosa appeared normal. the distal 5 cm of terminal ileal mucosa also appeared normal. retroflexion was performed.   Freda Munro capsule study N/A 06/08/2015    Procedure: GIVENS CAPSULE STUDY;  Surgeon: [name], MD;  Location: [facility];  Service: Endoscopy;  Laterality: N/A;  0700   . Esophagogastroduodenoscopy (egd) with propofol N/A 06/20/2015    Procedure: ESOPHAGOGASTRODUODENOSCOPY (EGD) WITH PROPOFOL;  Surgeon: Danie Binder, MD;  Location: [facility];  Service: Endoscopy;  Laterality: N/A;  . Esophageal biopsy N/A 06/20/2015    Procedure: BIOPSY;  Surgeon: Danie Binder, MD;  Location: [facility];  Service: Endoscopy;  Laterality: N/A;   Family History  Problem Relation Age of Onset  . Colon cancer Neg Hx   . Liver disease Neg Hx    Social History  Substance Use Topics  . Smoking status: Former Smoker -- 1.00 packs/day for 55 years    Types: Cigarettes    Start date: 08/19/1953    Quit date: 05/07/2014  .  Smokeless tobacco: Current User    Types: Chew    Last Attempt to Quit: 08/20/1995     Comment: Has quit on 3 occasions. Counseling given today 5-10 minutes   I am more than likely going to quit "  . Alcohol Use: No     Comment: Socially, sometimes 12 ounce beer daily, may go month without/no whiskey    Review of Systems  Constitutional: Negative for fever.  HENT: Negative for congestion.   Eyes: Negative for redness.  Respiratory: Negative for shortness of breath.   Cardiovascular: Negative for chest pain.  Gastrointestinal: Positive for blood in stool and hematochezia. Negative for nausea, vomiting and abdominal pain.  Genitourinary: Negative for dysuria.  Musculoskeletal: Negative for back pain.  Skin: Negative for rash.  Neurological: Negative for syncope, light-headedness and headaches.  Hematological: Bruises/bleeds  easily.  Psychiatric/Behavioral: Negative for confusion.      Allergies  Review of patient's allergies indicates no known allergies.  Home Medications   Prior to Admission medications   Medication Sig Start Date End Date Taking? Authorizing Provider  albuterol (PROVENTIL) 4 MG tablet Take 4 mg by mouth 3 (three) times daily. 01/20/15   Historical Provider, MD  albuterol-ipratropium (COMBIVENT) 18-103 MCG/ACT inhaler Inhale 1 puff into the lungs 4 (four) times daily. Coughing/ Shortness of Breath    Historical Provider, MD  allopurinol (ZYLOPRIM) 300 MG tablet Take 300 mg by mouth daily.    Historical Provider, MD  cholecalciferol (VITAMIN D) 1000 UNITS tablet Take 2,000 Units by mouth daily.    Historical Provider, MD  Emollient (EUCERIN) lotion Apply 10 mLs topically as needed for dry skin.    Historical Provider, MD  fenofibrate (TRICOR) 145 MG tablet TAKE 1 TABLET BY MOUTH ONCE DAILY FOR CHOLESTEROL. 06/22/15   [name], MD  ferrous sulfate 325 (65 FE) MG EC tablet Take 1 tablet (325 mg total) by mouth daily with breakfast. Patient not taking: Reported  on 06/08/2015 06/02/15   Rexene Alberts, MD  fish oil-omega-3 fatty acids 1000 MG capsule Take 1 capsule (1 g total) by mouth 2 (two) times daily. Patient not taking: Reported on 06/15/2015 01/22/13   Rockne Menghini, RPH-CPP  furosemide (LASIX) 40 MG tablet Take 1 tablet (40 mg total) by mouth daily. RESTART ON SATURDAY, 09/03/14. Patient taking differently: Take 80 mg by mouth daily.  06/02/15   Rexene Alberts, MD  glimepiride (AMARYL) 1 MG tablet Take 1 mg by mouth 2 (two) times daily.    Historical Provider, MD  insulin detemir (LEVEMIR) 100 UNIT/ML injection Inject 0.1 mLs (10 Units total) into the skin at bedtime. Patient not taking: Reported on 06/27/2015 06/23/15   Erline Hau, MD  isosorbide mononitrate (IMDUR) 30 MG 24 hr tablet TAKE ONE TABLET BY MOUTH ONCE DAILY. 06/22/15   Troy Sine, MD  metoprolol succinate (TOPROL-XL) 25 MG 24 hr tablet Take 1 tablet (25 mg total) by mouth daily. 06/23/15   Erline Hau, MD  nitroGLYCERIN (NITROSTAT) 0.4 MG SL tablet Place 1 tablet (0.4 mg total) under the tongue every 5 (five) minutes as needed for chest pain. 05/17/14   Eileen Stanford, PA-C  oxyCODONE-acetaminophen (PERCOCET) 10-325 MG per tablet Take 1 tablet by mouth every 4 (four) hours as needed for pain. Patient taking differently: Take 1 tablet by mouth every 3 (three) hours as needed for pain.  09/23/14   [name], MD  pantoprazole (PROTONIX) 40 MG tablet Take 1 tablet (40 mg total) by mouth 2 (two) times daily before a meal. 06/23/15   Erline Hau, MD  potassium chloride SA (K-DUR,KLOR-CON) 20 MEQ tablet Take 20 mEq by mouth daily.      Historical Provider, MD  pregabalin (LYRICA) 50 MG capsule Take one capsule by mouth twice daily for pains Patient taking differently: Take 50 mg by mouth 3 (three) times daily.  08/23/14   Blanchie Serve, MD  ramipril (ALTACE) 2.5 MG capsule Take 1 capsule (2.5 mg total) by mouth 2 (two) times daily. Patient taking  differently: Take 2.5-5 mg by mouth 2 (two) times daily. Takes '5mg'$  capsule daily in the morning and takes 2.'5mg'$  capsule at bedtime 06/02/15   Rexene Alberts, MD  rosuvastatin (CRESTOR) 20 MG tablet TAKE (1) TABLET BY MOUTH AT BEDTIME FOR CHOLESTEROL. 06/22/15   Pearletha Forge  [name], MD  sitaGLIPtin (JANUVIA) 50 MG tablet Take 50 mg by mouth daily.    Historical Provider, MD  vitamin C (ASCORBIC ACID) 500 MG tablet Take 500 mg by mouth daily.    Historical Provider, MD  warfarin (COUMADIN) 5 MG tablet Take 1 tablet by mouth daily or as directed by coumadin clinic. Patient taking differently: Take 2.5-5 mg by mouth. 2.'5mg'$  on Mondays and Fridays only. Takes '5mg'$  on all other day 06/02/15   Rexene Alberts, MD   BP 123/30 mmHg  Pulse 68  Temp(Src) 98 F (36.7 C) (Oral)  Resp 22  Ht '5\' 10"'$  (1.778 m)  Wt 206 lb (93.441 kg)  BMI 29.56 kg/m2  SpO2 100% Physical Exam  Constitutional: He is oriented to person, place, and time. He appears well-developed and well-nourished. No distress.  HENT:  Head: Normocephalic and atraumatic.  Mouth/Throat: Oropharynx is clear and moist.  Eyes: Conjunctivae and EOM are normal. Pupils are equal, round, and reactive to light.  Neck: Normal range of motion.  Cardiovascular: Normal rate, regular rhythm and normal heart sounds.   No murmur heard. Valve click.  Pulmonary/Chest: Effort normal and breath sounds normal. No respiratory distress.  Abdominal: Bowel sounds are normal. He exhibits no distension. There is no tenderness.  Genitourinary: Guaiac positive stool.  Rectal exam with melanotic stool. Normal sphincter tone no mass. No external hemorrhoids.  Musculoskeletal: Normal range of motion.  Neurological: He is alert and oriented to person, place, and time. No cranial nerve deficit. He exhibits normal muscle tone. Coordination normal.  Skin: Skin is warm.  Nursing note and vitals reviewed.   ED Course  Procedures (including critical care time) Labs Review Labs  Reviewed  COMPREHENSIVE METABOLIC PANEL - Abnormal; Notable for the following:    Glucose, Bld 136 (*)    BUN 61 (*)    Creatinine, Ser 1.64 (*)    Total Protein 5.9 (*)    Albumin 3.4 (*)    ALT 13 (*)    Alkaline Phosphatase 21 (*)    GFR calc non Af Amer 40 (*)    GFR calc Af Amer 46 (*)    All other components within normal limits  CBC - Abnormal; Notable for the following:    RBC 3.38 (*)    Hemoglobin 8.7 (*)    HCT 28.3 (*)    MCH 25.7 (*)    RDW 20.4 (*)    All other components within normal limits  PROTIME-INR - Abnormal; Notable for the following:    Prothrombin Time 37.7 (*)    INR 3.96 (*)    All other components within normal limits  POC OCCULT BLOOD, ED  POC OCCULT BLOOD, ED  TYPE AND SCREEN    Imaging Review No results found. I have personally reviewed and evaluated these images and lab results as part of my medical decision-making.   EKG Interpretation None      MDM   Final diagnoses:  Gastrointestinal hemorrhage, unspecified gastritis, unspecified gastrointestinal hemorrhage type    Patient with history of recurrent GI bleed today. Not tachycardic not hypotensive. Rectal exam showed the black stool heme positive. Patient stated there was one bowel movement today they had a little bit red blood in it. No abdominal pain. Patient with admission for similar problem in October required several units of blood transfusion. GI workup showed a small bowel ulcer but not sure if that was the cause of the bleeding. Patient's INR is once again elevated. At 3.9.  Discussed  with hospitalist will admit to stepdown unit. Orders done.     Fredia Sorrow, MD 07/04/15 [phone]

## 2015-07-04 NOTE — Progress Notes (Signed)
ANTICOAGULATION CONSULT NOTE  Pharmacy Consult for Warfarin Indication: Mechanical Valve  No Known Allergies  Patient Measurements: Height: '5\' 10"'$  (177.8 cm) Weight: 201 lb 15.1 oz (91.6 kg) IBW/kg (Calculated) : 73  Vital Signs: Temp: 98 F (36.7 C) (11/15 2106) Temp Source: Oral (11/15 2106) BP: 99/45 mmHg (11/15 2000) Pulse Rate: 68 (11/15 1902)  Labs:  Recent Labs  07/04/15 1834  HGB 8.7*  HCT 28.3*  PLT 226  LABPROT 37.7*  INR 3.96*  CREATININE 1.64*    Estimated Creatinine Clearance: 44.9 mL/min (by C-G formula based on Cr of 1.64).   Medical History: Past Medical History  Diagnosis Date  . Type 2 diabetes mellitus ([place]) 2007  . Essential hypertension   . Gout   . Hypercholesteremia   . Peripheral vascular disease ([place])     Lower extremity PCI/stenting  . COPD (chronic obstructive pulmonary disease) ([place])   . S/P aortic valve replacement 1990    a. St. Jude  . Chronic back pain   . Dysphagia   . Neuromuscular disorder ([place])   . Peripheral neuropathy ([place])   . Critical lower limb ischemia   . CAD (coronary artery disease)     a. 05/13/14 [name] s/p overlapping DESx2 to SVG to RCA. b.  s/p CABG '90 with redo '94 & stent to RCA SVG in 2005  . Chronic toe ulcer ([place])     a. Left foot  . Chronic systolic heart failure ([place])   . History of kidney stones   . GERD (gastroesophageal reflux disease)   . Arthritis   . Sleep apnea   . Myocardial infarction ([place])   . GI bleeding 05/31/2015    Source not identified.  . Acute blood loss anemia 02/2015 & 05/2015    Hemoglobin 5.5 on 05/31/15; status post transfusion.  . CHF (congestive heart failure) ([facility])     Medications:  Prescriptions prior to admission  Medication Sig Dispense Refill Last Dose  . albuterol (PROVENTIL) 4 MG tablet Take 4 mg by mouth 3 (three) times daily.   07/04/2015 at Unknown time  . albuterol-ipratropium (COMBIVENT) 18-103 MCG/ACT inhaler Inhale 1 puff into the lungs 4 (four) times  daily. Coughing/ Shortness of Breath   07/04/2015 at Unknown time  . allopurinol (ZYLOPRIM) 300 MG tablet Take 300 mg by mouth daily.   07/04/2015 at Unknown time  . cholecalciferol (VITAMIN D) 1000 UNITS tablet Take 2,000 Units by mouth daily.   07/04/2015 at Unknown time  . Emollient (EUCERIN) lotion Apply 10 mLs topically as needed for dry skin.   Past Week at Unknown time  . fenofibrate (TRICOR) 145 MG tablet TAKE 1 TABLET BY MOUTH ONCE DAILY FOR CHOLESTEROL. 30 tablet 0 07/04/2015 at Unknown time  . fish oil-omega-3 fatty acids 1000 MG capsule Take 1 capsule (1 g total) by mouth 2 (two) times daily.   07/04/2015 at Unknown time  . furosemide (LASIX) 40 MG tablet Take 1 tablet (40 mg total) by mouth daily. RESTART ON SATURDAY, 09/03/14. (Patient taking differently: Take 80 mg by mouth daily. )   07/04/2015 at Unknown time  . glimepiride (AMARYL) 1 MG tablet Take 1 mg by mouth 2 (two) times daily.   07/04/2015 at Unknown time  . isosorbide mononitrate (IMDUR) 30 MG 24 hr tablet TAKE ONE TABLET BY MOUTH ONCE DAILY. 30 tablet 0 07/04/2015 at Unknown time  . metoprolol succinate (TOPROL-XL) 50 MG 24 hr tablet Take 50 mg by mouth daily.   07/04/2015 at 8-900a  .  nitroGLYCERIN (NITROSTAT) 0.4 MG SL tablet Place 1 tablet (0.4 mg total) under the tongue every 5 (five) minutes as needed for chest pain. 25 tablet 12 unknown  . oxyCODONE-acetaminophen (PERCOCET) 10-325 MG per tablet Take 1 tablet by mouth every 4 (four) hours as needed for pain. (Patient taking differently: Take 1 tablet by mouth every 3 (three) hours as needed for pain. ) 30 tablet 0 Past Week at Unknown time  . pantoprazole (PROTONIX) 40 MG tablet Take 1 tablet (40 mg total) by mouth 2 (two) times daily before a meal. 60 tablet 2 07/04/2015 at morning  . potassium chloride SA (K-DUR,KLOR-CON) 20 MEQ tablet Take 20 mEq by mouth daily.     07/04/2015 at Unknown time  . pregabalin (LYRICA) 50 MG capsule Take one capsule by mouth twice daily for  pains (Patient taking differently: Take 50 mg by mouth 3 (three) times daily. ) 60 capsule 5 07/04/2015 at Unknown time  . ramipril (ALTACE) 2.5 MG capsule Take 1 capsule (2.5 mg total) by mouth 2 (two) times daily. (Patient taking differently: Take 2.5-5 mg by mouth 2 (two) times daily. Takes '5mg'$  capsule daily in the morning and takes 2.'5mg'$  capsule at bedtime) 60 capsule 3 07/04/2015 at Unknown time  . rosuvastatin (CRESTOR) 20 MG tablet TAKE (1) TABLET BY MOUTH AT BEDTIME FOR CHOLESTEROL. 30 tablet 0 07/03/2015 at Unknown time  . sitaGLIPtin (JANUVIA) 50 MG tablet Take 50 mg by mouth daily.   07/04/2015 at Unknown time  . vitamin C (ASCORBIC ACID) 500 MG tablet Take 500 mg by mouth daily.   07/04/2015 at Unknown time  . warfarin (COUMADIN) 5 MG tablet Take 1 tablet by mouth daily or as directed by coumadin clinic. (Patient taking differently: Take 2.5-5 mg by mouth. 2.'5mg'$  on Mondays and Fridays only. Takes '5mg'$  on all other day) 30 tablet 1 07/03/2015 at [place]  . ferrous sulfate 325 (65 FE) MG EC tablet Take 1 tablet (325 mg total) by mouth daily with breakfast. (Patient not taking: Reported on 06/08/2015)   Not Taking  . insulin detemir (LEVEMIR) 100 UNIT/ML injection Inject 0.1 mLs (10 Units total) into the skin at bedtime. (Patient not taking: Reported on 06/27/2015) 10 mL 11 Not Taking  . metoprolol succinate (TOPROL-XL) 25 MG 24 hr tablet Take 1 tablet (25 mg total) by mouth daily. (Patient not taking: Reported on 07/04/2015) 30 tablet 2  at 8-900a   Assessment: Okay for Protocol, elevated INR on admission.  Home dose '5mg'$  daily except 2.'5mg'$  on Mondays.  Recent Melena / Ulcer / Anemia  Goal of Therapy:  INR 2.5 - 3.5   Plan:  No Warfarin tonight. Daily PT/INR  Pricilla Larsson 07/04/2015,9:43 PM

## 2015-07-05 ENCOUNTER — Ambulatory Visit: Payer: Medicare Other | Admitting: *Deleted

## 2015-07-05 DIAGNOSIS — E1122 Type 2 diabetes mellitus with diabetic chronic kidney disease: Secondary | ICD-10-CM

## 2015-07-05 DIAGNOSIS — Z954 Presence of other heart-valve replacement: Secondary | ICD-10-CM

## 2015-07-05 DIAGNOSIS — N181 Chronic kidney disease, stage 1: Secondary | ICD-10-CM

## 2015-07-05 DIAGNOSIS — J449 Chronic obstructive pulmonary disease, unspecified: Secondary | ICD-10-CM

## 2015-07-05 DIAGNOSIS — Z89512 Acquired absence of left leg below knee: Secondary | ICD-10-CM

## 2015-07-05 DIAGNOSIS — D62 Acute posthemorrhagic anemia: Secondary | ICD-10-CM | POA: Diagnosis not present

## 2015-07-05 DIAGNOSIS — I5043 Acute on chronic combined systolic (congestive) and diastolic (congestive) heart failure: Secondary | ICD-10-CM | POA: Diagnosis not present

## 2015-07-05 DIAGNOSIS — K2901 Acute gastritis with bleeding: Secondary | ICD-10-CM | POA: Diagnosis not present

## 2015-07-05 DIAGNOSIS — K922 Gastrointestinal hemorrhage, unspecified: Secondary | ICD-10-CM | POA: Diagnosis not present

## 2015-07-05 DIAGNOSIS — N179 Acute kidney failure, unspecified: Secondary | ICD-10-CM

## 2015-07-05 LAB — GLUCOSE, CAPILLARY
GLUCOSE-CAPILLARY: 116 mg/dL — AB (ref 65–99)
GLUCOSE-CAPILLARY: 132 mg/dL — AB (ref 65–99)
GLUCOSE-CAPILLARY: 139 mg/dL — AB (ref 65–99)
GLUCOSE-CAPILLARY: 163 mg/dL — AB (ref 65–99)
GLUCOSE-CAPILLARY: 95 mg/dL (ref 65–99)
Glucose-Capillary: 143 mg/dL — ABNORMAL HIGH (ref 65–99)
Glucose-Capillary: 226 mg/dL — ABNORMAL HIGH (ref 65–99)

## 2015-07-05 LAB — CBC
HCT: 27 % — ABNORMAL LOW (ref 39.0–52.0)
HCT: 26.3 % — ABNORMAL LOW (ref 39.0–52.0)
HEMATOCRIT: 28 % — AB (ref 39.0–52.0)
HEMATOCRIT: 26.3 % — AB (ref 39.0–52.0)
HEMOGLOBIN: 8.5 g/dL — AB (ref 13.0–17.0)
HEMOGLOBIN: 8.3 g/dL — AB (ref 13.0–17.0)
Hemoglobin: 8 g/dL — ABNORMAL LOW (ref 13.0–17.0)
Hemoglobin: 7.9 g/dL — ABNORMAL LOW (ref 13.0–17.0)
MCH: 25.7 pg — ABNORMAL LOW (ref 26.0–34.0)
MCH: 25.7 pg — ABNORMAL LOW (ref 26.0–34.0)
MCH: 25.6 pg — ABNORMAL LOW (ref 26.0–34.0)
MCH: 25.6 pg — ABNORMAL LOW (ref 26.0–34.0)
MCHC: 30.4 g/dL (ref 30.0–36.0)
MCHC: 30.4 g/dL (ref 30.0–36.0)
MCHC: 30.7 g/dL (ref 30.0–36.0)
MCHC: 30 g/dL (ref 30.0–36.0)
MCV: 84.6 fL (ref 78.0–100.0)
MCV: 84.6 fL (ref 78.0–100.0)
MCV: 83.3 fL (ref 78.0–100.0)
MCV: 85.4 fL (ref 78.0–100.0)
PLATELETS: 206 10*3/uL (ref 150–400)
PLATELETS: 214 10*3/uL (ref 150–400)
PLATELETS: 204 10*3/uL (ref 150–400)
Platelets: 234 10*3/uL (ref 150–400)
RBC: 3.31 MIL/uL — ABNORMAL LOW (ref 4.22–5.81)
RBC: 3.11 MIL/uL — AB (ref 4.22–5.81)
RBC: 3.24 MIL/uL — AB (ref 4.22–5.81)
RBC: 3.08 MIL/uL — AB (ref 4.22–5.81)
RDW: 20.9 % — ABNORMAL HIGH (ref 11.5–15.5)
RDW: 20.7 % — ABNORMAL HIGH (ref 11.5–15.5)
RDW: 20.7 % — ABNORMAL HIGH (ref 11.5–15.5)
RDW: 20.7 % — ABNORMAL HIGH (ref 11.5–15.5)
WBC: 9.5 10*3/uL (ref 4.0–10.5)
WBC: 7.6 10*3/uL (ref 4.0–10.5)
WBC: 8 10*3/uL (ref 4.0–10.5)
WBC: 7.1 10*3/uL (ref 4.0–10.5)

## 2015-07-05 LAB — TROPONIN I
TROPONIN I: 0.06 ng/mL — AB (ref <0.031)
TROPONIN I: 0.06 ng/mL — AB (ref <0.031)

## 2015-07-05 LAB — PROTIME-INR
INR: 3.64 — AB (ref 0.00–1.49)
Prothrombin Time: 35.4 seconds — ABNORMAL HIGH (ref 11.6–15.2)

## 2015-07-05 MED ORDER — IPRATROPIUM-ALBUTEROL 0.5-2.5 (3) MG/3ML IN SOLN: 3.0000 mL | Freq: Four times a day (QID) | RESPIRATORY_TRACT | Status: DC | PRN

## 2015-07-05 NOTE — Care Management Note (Signed)
Case Management Note  Patient Details  Name: VERLON PISCHKE MRN: [id number] Date of Birth: 09/21/1940  Subjective/Objective:                  Pt is from home with strong family support. Pt is wheelchair bound. Pt goes to [facility] OP clinic for wound care. Pt is active with [facility].   Action/Plan: Pt plans to return home with self care at DC. Will cont to follow, no CM needs anticipated.   Expected Discharge Date:  07/07/15               Expected Discharge Plan:  Home/Self Care  In-House Referral:  NA  Discharge planning Services  CM Consult  Post Acute Care Choice:  NA Choice offered to:  NA  DME Arranged:    DME Agency:     HH Arranged:    HH Agency:     Status of Service:  In process, will continue to follow  Medicare Important Message Given:    Date Medicare IM Given:    Medicare IM give by:    Date Additional Medicare IM Given:    Additional Medicare Important Message give by:     If discussed at [place] of Stay Meetings, dates discussed:    Additional Comments:  Sherald Barge, RN 07/05/2015, 2:41 PM

## 2015-07-05 NOTE — Plan of Care (Signed)
Problem: Education: Goal: Ability to identify signs and symptoms of gastrointestinal bleeding will improve Outcome: Progressing Given literature and discussed

## 2015-07-05 NOTE — Progress Notes (Signed)
ANTICOAGULATION CONSULT NOTE  Pharmacy Consult for Warfarin Indication: Mechanical Valve  No Known Allergies  Patient Measurements: Height: '5\' 10"'$  (177.8 cm) Weight: 201 lb 15.1 oz (91.6 kg) IBW/kg (Calculated) : 73  Vital Signs: Temp: 98 F (36.7 C) (11/16 0400) Temp Source: Oral (11/16 0400) BP: 112/46 mmHg (11/16 1000) Pulse Rate: 58 (11/16 1000)  Labs:  Recent Labs  07/04/15 1834 07/04/15 2122 07/04/15 2302 07/05/15 0402 07/05/15 0905  HGB 8.7* 8.7*  --  8.5* 8.0*  HCT 28.3* 27.8*  --  28.0* 26.3*  PLT 226 221  --  234 206  LABPROT 37.7*  --   --  35.4*  --   INR 3.96*  --   --  3.64*  --   CREATININE 1.64*  --   --   --   --   TROPONINI  --   --  0.06* 0.06*  --    Estimated Creatinine Clearance: 44.9 mL/min (by C-G formula based on Cr of 1.64).  Medical History: Past Medical History  Diagnosis Date  . Type 2 diabetes mellitus ([place]) 2007  . Essential hypertension   . Gout   . Hypercholesteremia   . Peripheral vascular disease ([place])     Lower extremity PCI/stenting  . COPD (chronic obstructive pulmonary disease) ([place])   . S/P aortic valve replacement 1990    a. [employer] Jude  . Chronic back pain   . Dysphagia   . Neuromuscular disorder ([place])   . Peripheral neuropathy ([place])   . Critical lower limb ischemia   . CAD (coronary artery disease)     a. 05/13/14 [name] s/p overlapping DESx2 to SVG to RCA. b.  s/p CABG '90 with redo '94 & stent to RCA SVG in 2005  . Chronic toe ulcer ([place])     a. Left foot  . Chronic systolic heart failure ([place])   . History of kidney stones   . GERD (gastroesophageal reflux disease)   . Arthritis   . Sleep apnea   . Myocardial infarction ([place])   . GI bleeding 05/31/2015    Source not identified.  . Acute blood loss anemia 02/2015 & 05/2015    Hemoglobin 5.5 on 05/31/15; status post transfusion.  . CHF (congestive heart failure) (HCC)    Medications:  Prescriptions prior to admission  Medication Sig Dispense Refill Last Dose   . albuterol (PROVENTIL) 4 MG tablet Take 4 mg by mouth 3 (three) times daily.   07/04/2015 at Unknown time  . albuterol-ipratropium (COMBIVENT) 18-103 MCG/ACT inhaler Inhale 1 puff into the lungs 4 (four) times daily. Coughing/ Shortness of Breath   07/04/2015 at Unknown time  . allopurinol (ZYLOPRIM) 300 MG tablet Take 300 mg by mouth daily.   07/04/2015 at Unknown time  . cholecalciferol (VITAMIN D) 1000 UNITS tablet Take 2,000 Units by mouth daily.   07/04/2015 at Unknown time  . Emollient (EUCERIN) lotion Apply 10 mLs topically as needed for dry skin.   Past Week at Unknown time  . fenofibrate (TRICOR) 145 MG tablet TAKE 1 TABLET BY MOUTH ONCE DAILY FOR CHOLESTEROL. 30 tablet 0 07/04/2015 at Unknown time  . fish oil-omega-3 fatty acids 1000 MG capsule Take 1 capsule (1 g total) by mouth 2 (two) times daily.   07/04/2015 at Unknown time  . furosemide (LASIX) 40 MG tablet Take 1 tablet (40 mg total) by mouth daily. RESTART ON SATURDAY, 09/03/14. (Patient taking differently: Take 80 mg by mouth daily. )   07/04/2015 at Unknown time  .  glimepiride (AMARYL) 1 MG tablet Take 1 mg by mouth 2 (two) times daily.   07/04/2015 at Unknown time  . isosorbide mononitrate (IMDUR) 30 MG 24 hr tablet TAKE ONE TABLET BY MOUTH ONCE DAILY. 30 tablet 0 07/04/2015 at Unknown time  . metoprolol succinate (TOPROL-XL) 50 MG 24 hr tablet Take 50 mg by mouth daily.   07/04/2015 at 8-900a  . nitroGLYCERIN (NITROSTAT) 0.4 MG SL tablet Place 1 tablet (0.4 mg total) under the tongue every 5 (five) minutes as needed for chest pain. 25 tablet 12 unknown  . oxyCODONE-acetaminophen (PERCOCET) 10-325 MG per tablet Take 1 tablet by mouth every 4 (four) hours as needed for pain. (Patient taking differently: Take 1 tablet by mouth every 3 (three) hours as needed for pain. ) 30 tablet 0 Past Week at Unknown time  . pantoprazole (PROTONIX) 40 MG tablet Take 1 tablet (40 mg total) by mouth 2 (two) times daily before a meal. 60 tablet 2  07/04/2015 at morning  . potassium chloride SA (K-DUR,KLOR-CON) 20 MEQ tablet Take 20 mEq by mouth daily.     07/04/2015 at Unknown time  . pregabalin (LYRICA) 50 MG capsule Take one capsule by mouth twice daily for pains (Patient taking differently: Take 50 mg by mouth 3 (three) times daily. ) 60 capsule 5 07/04/2015 at Unknown time  . ramipril (ALTACE) 2.5 MG capsule Take 1 capsule (2.5 mg total) by mouth 2 (two) times daily. (Patient taking differently: Take 2.5-5 mg by mouth 2 (two) times daily. Takes '5mg'$  capsule daily in the morning and takes 2.'5mg'$  capsule at bedtime) 60 capsule 3 07/04/2015 at Unknown time  . rosuvastatin (CRESTOR) 20 MG tablet TAKE (1) TABLET BY MOUTH AT BEDTIME FOR CHOLESTEROL. 30 tablet 0 07/03/2015 at Unknown time  . sitaGLIPtin (JANUVIA) 50 MG tablet Take 50 mg by mouth daily.   07/04/2015 at Unknown time  . vitamin C (ASCORBIC ACID) 500 MG tablet Take 500 mg by mouth daily.   07/04/2015 at Unknown time  . warfarin (COUMADIN) 5 MG tablet Take 1 tablet by mouth daily or as directed by coumadin clinic. (Patient taking differently: Take 2.5-5 mg by mouth. 2.'5mg'$  on Mondays and Fridays only. Takes '5mg'$  on all other day) 30 tablet 1 07/03/2015 at [place]  . ferrous sulfate 325 (65 FE) MG EC tablet Take 1 tablet (325 mg total) by mouth daily with breakfast. (Patient not taking: Reported on 06/08/2015)   Not Taking  . insulin detemir (LEVEMIR) 100 UNIT/ML injection Inject 0.1 mLs (10 Units total) into the skin at bedtime. (Patient not taking: Reported on 06/27/2015) 10 mL 11 Not Taking  . metoprolol succinate (TOPROL-XL) 25 MG 24 hr tablet Take 1 tablet (25 mg total) by mouth daily. (Patient not taking: Reported on 07/04/2015) 30 tablet 2  at 8-900a   Assessment: Okay for Protocol, elevated INR.  H/H trending down. Home dose '5mg'$  daily except 2.'5mg'$  on Mondays.  Recent Melena / Ulcer / Anemia  Goal of Therapy:  INR 2.5 - 3.5   Plan:  No Warfarin today. Daily PT/INR Monitor CBC, s/sx  of bleeding complications  Hart Robinsons A 07/05/2015,11:19 AM

## 2015-07-05 NOTE — Progress Notes (Signed)
[facility] PROGRESS NOTE  Tanner Pena FTD:[id number] DOB: 09-18-1940 DOA: 07/04/2015 PCP: [name], MD  Assessment/Plan: 1. Acute on chronic upper GI bleed with melena. Recent EGD on 11/1 revealed erosive gastritis. Hgb 8.5 today. Will continue to monitor and transfuse if <7. Continue IV PPI BID. GI consulted. Continue NPO until seen by GI 2. Chronic combined systolic and diastolic heart failure, appears compensated.  3. Elevated troponin with a hx of CAD. Troponin .06 today, will continue to trend. EKG is reassuring. Patient reports chronic intermittent diffuse CP that is nonexertional. 4. AKI, possibly related to hypotension and volume depletion. Will continue IVF and monitor. Will hold ACE- I for now.  5. COPD, without exacerbation. Continue bronchodilators as needed.  6. Mechanical AVR, on Coumadin rx. Pharmacy following for adjustment in Coumadin given GI bleed. INR 3.64 today. Coumadin currently on hold.  7. DM type 2, stable. Continue SSI and monitor.  8. Essential HTN, stable. Continue home meds and monitor.    Code Status: Full DVT prophylaxis: Warfarin Family Communication: Discussed with patient who understands and has no concerns at this time.  Disposition Plan: Anticipate discharge in 2-3 days.    Consultants:    Procedures:    Antibiotics:    HPI/Subjective: Feels okay. Had melena yesterday but denies any today thus far. Also reports mild diffuse chest pain that is intermittent and feels like a pressure. States that he always has some degree of CP.   Objective: Filed Vitals:   07/05/15 0600  BP: 120/45  Pulse: 62  Temp:   Resp:     Intake/Output Summary (Last 24 hours) at 07/05/15 0729 Last data filed at 07/05/15 0600  Gross per 24 hour  Intake     99 ml  Output      0 ml  Net     99 ml   Filed Weights   07/04/15 1758 07/04/15 2106 07/05/15 0500  Weight: 93.441 kg (206 lb) 91.6 kg (201 lb 15.1 oz) 91.6 kg (201 lb 15.1 oz)     Exam:  General: NAD, looks comfortable Cardiovascular: RRR, S1, S2, audible mechanical click.  Respiratory: clear bilaterally, No wheezing, rales or rhonchi Abdomen: soft, non tender, no distention , bowel sounds normal Musculoskeletal: No edema. Left BKA.  Data Reviewed: Basic Metabolic Panel:  Recent Labs Lab 07/04/15 1834  NA 140  K 4.9  CL 106  CO2 26  GLUCOSE 136*  BUN 61*  CREATININE 1.64*  CALCIUM 9.3   Liver Function Tests:  Recent Labs Lab 07/04/15 1834  AST 19  ALT 13*  ALKPHOS 21*  BILITOT 0.5  PROT 5.9*  ALBUMIN 3.4*   CBC:  Recent Labs Lab 07/04/15 1834 07/04/15 2122 07/05/15 0402  WBC 8.7 10.3 9.5  HGB 8.7* 8.7* 8.5*  HCT 28.3* 27.8* 28.0*  MCV 83.7 83.5 84.6  PLT 226 221 234   Cardiac Enzymes:  Recent Labs Lab 07/04/15 2302 07/05/15 0402  TROPONINI 0.06* 0.06*   BNP (last 3 results)  Recent Labs  12/28/14 0134 02/04/15 0413  BNP 504.3* 593.2*    CBG:  Recent Labs Lab 07/04/15 2218 07/05/15 0036 07/05/15 0452  GLUCAP 129* 116* 132*     Studies: No results found.  Scheduled Meds: . albuterol  4 mg Oral TID  . allopurinol  300 mg Oral Daily  . cholecalciferol  2,000 Units Oral Daily  . fenofibrate  160 mg Oral Daily  . insulin aspart  0-15 Units Subcutaneous 6 times per day  .  isosorbide mononitrate  30 mg Oral Daily  . metoprolol succinate  50 mg Oral Daily  . omega-3 acid ethyl esters  1 g Oral BID  . pantoprazole (PROTONIX) IV  40 mg Intravenous Q12H  . potassium chloride SA  20 mEq Oral Daily  . pregabalin  50 mg Oral TID  . ramipril  2.5 mg Oral BID  . rosuvastatin  20 mg Oral q1800  . sodium chloride  3 mL Intravenous Q12H  . 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 MD [facility] Pager [phone]. If 7PM-7AM, please contact night-coverage at [email or web address], password [facility] 07/05/2015, 7:29 AM  LOS: 1 day     By signing my name below, I, Rosalie Doctor, attest that this documentation has been prepared under the direction and in the presence of [facility]. MD Electronically Signed: Rosalie Doctor, Scribe. 07/05/2015 9:06am   I, Dr. Kathie Dike, personally performed the services described in this documentaiton. All medical record entries made by the scribe were at my direction and in my presence. I have reviewed the chart and agree that the record reflects my personal performance and is accurate and complete  Kathie Dike, MD, 07/05/2015 9:16 AM

## 2015-07-05 NOTE — Care Management Obs Status (Signed)
[place] NOTIFICATION   Patient Details  Name: Tanner Pena MRN: [id number] Date of Birth: 1940-10-26   Medicare Observation Status Notification Given:  Yes    Sherald Barge, RN 07/05/2015, 2:40 PM

## 2015-07-06 ENCOUNTER — Encounter (HOSPITAL_COMMUNITY): Payer: Self-pay | Admitting: *Deleted

## 2015-07-06 DIAGNOSIS — K2970 Gastritis, unspecified, without bleeding: Secondary | ICD-10-CM

## 2015-07-06 DIAGNOSIS — K2901 Acute gastritis with bleeding: Secondary | ICD-10-CM | POA: Diagnosis not present

## 2015-07-06 DIAGNOSIS — K922 Gastrointestinal hemorrhage, unspecified: Secondary | ICD-10-CM | POA: Diagnosis not present

## 2015-07-06 DIAGNOSIS — D62 Acute posthemorrhagic anemia: Secondary | ICD-10-CM | POA: Diagnosis not present

## 2015-07-06 DIAGNOSIS — K921 Melena: Principal | ICD-10-CM

## 2015-07-06 DIAGNOSIS — K2990 Gastroduodenitis, unspecified, without bleeding: Secondary | ICD-10-CM

## 2015-07-06 LAB — CBC
HEMATOCRIT: 25.4 % — AB (ref 39.0–52.0)
Hemoglobin: 7.8 g/dL — ABNORMAL LOW (ref 13.0–17.0)
MCH: 25.9 pg — ABNORMAL LOW (ref 26.0–34.0)
MCHC: 30.7 g/dL (ref 30.0–36.0)
MCV: 84.4 fL (ref 78.0–100.0)
PLATELETS: 185 10*3/uL (ref 150–400)
RBC: 3.01 MIL/uL — ABNORMAL LOW (ref 4.22–5.81)
RDW: 20.8 % — ABNORMAL HIGH (ref 11.5–15.5)
WBC: 6.5 10*3/uL (ref 4.0–10.5)

## 2015-07-06 LAB — GLUCOSE, CAPILLARY
GLUCOSE-CAPILLARY: 135 mg/dL — AB (ref 65–99)
GLUCOSE-CAPILLARY: 129 mg/dL — AB (ref 65–99)
Glucose-Capillary: 159 mg/dL — ABNORMAL HIGH (ref 65–99)
Glucose-Capillary: 152 mg/dL — ABNORMAL HIGH (ref 65–99)
Glucose-Capillary: 133 mg/dL — ABNORMAL HIGH (ref 65–99)

## 2015-07-06 LAB — BASIC METABOLIC PANEL
Anion gap: 8 (ref 5–15)
BUN: 44 mg/dL — ABNORMAL HIGH (ref 6–20)
CHLORIDE: 109 mmol/L (ref 101–111)
CO2: 24 mmol/L (ref 22–32)
CREATININE: 1.07 mg/dL (ref 0.61–1.24)
Calcium: 8.9 mg/dL (ref 8.9–10.3)
Glucose, Bld: 111 mg/dL — ABNORMAL HIGH (ref 65–99)
Potassium: 4.2 mmol/L (ref 3.5–5.1)
SODIUM: 141 mmol/L (ref 135–145)

## 2015-07-06 LAB — PROTIME-INR
INR: 3.91 — AB (ref 0.00–1.49)
Prothrombin Time: 37.3 seconds — ABNORMAL HIGH (ref 11.6–15.2)

## 2015-07-06 MED ORDER — SUCRALFATE 1 GM/10ML PO SUSP
1.0000 g | Freq: Three times a day (TID) | ORAL | Status: DC
  Administered 2015-07-06 – 2015-07-09 (×12): 1 g via ORAL
  Filled 2015-07-06 (×11): qty 10

## 2015-07-06 NOTE — Progress Notes (Signed)
[facility] PROGRESS NOTE  Tanner Pena VOH:[id number] DOB: 29-Nov-1940 DOA: 07/04/2015 PCP: [name], MD  Assessment/Plan: 1. Acute on chronic upper GI bleed with melena. Recent EGD on 11/1 revealed erosive gastritis. Hgb 7.8 today. Will continue to monitor and transfuse if <7. Continue IV PPI BID. GI consulted. Patient is currently on clear liquids, will advance diet per recommendations from GI. Patient reports an episode of melena yesterday.  2. Anemia due to chronic blood loss. Hgb appears to be near baseline, will continue to monitor in the setting of GI bleed.  3. Chronic combined systolic and diastolic heart failure, appears compensated.  4. Elevated troponin with a hx of CAD.  EKG is reassuring. Patient reports chronic intermittent diffuse CP that is nonexertional. 5. AKI, possibly related to hypotension and volume depletion. Resolved with IVF. Will continue to hold ACE-I and monitor.  6. COPD, without exacerbation. Continue bronchodilators as needed.  7. Mechanical AVR, on Coumadin rx. Pharmacy following for adjustment in Coumadin given GI bleed. INR 3.91 today. Coumadin  on hold.  8. DM type 2, stable. Continue SSI and monitor.  9. Essential HTN, stable. Continue home meds and monitor.    Code Status: Full DVT prophylaxis: Warfarin Family Communication: Discussed with patient who understands and has no concerns at this time.  Disposition Plan: Move to medical floor today. Anticipate discharge in 1-2 days.     Consultants:    Procedures:    Antibiotics:    HPI/Subjective: Feels okay. States he had multiple BMs consistent with melena yesterday but denies any  BRBPR. He has not had any BMs today thus far. Denies any SOB or dizziness.   Objective: Filed Vitals:   07/06/15 0400  BP: 136/50  Pulse: 71  Temp: 98.2 F (36.8 C)  Resp: 22    Intake/Output Summary (Last 24 hours) at 07/06/15 6948 Last data filed at 07/05/15 2300  Gross per 24 hour   Intake    780 ml  Output    400 ml  Net    380 ml   Filed Weights   07/04/15 2106 07/05/15 0500 07/06/15 0500  Weight: 91.6 kg (201 lb 15.1 oz) 91.6 kg (201 lb 15.1 oz) 90.4 kg (199 lb 4.7 oz)    Exam:  General: NAD, looks comfortable Cardiovascular: RRR, S1, S2, audible mechanical click.  Respiratory: clear bilaterally, No wheezing, rales or rhonchi Abdomen: soft, non tender, no distention , bowel sounds normal Musculoskeletal: No edema. Left BKA.  Data Reviewed: Basic Metabolic Panel:  Recent Labs Lab 07/04/15 1834 07/06/15 0250  NA 140 141  K 4.9 4.2  CL 106 109  CO2 26 24  GLUCOSE 136* 111*  BUN 61* 44*  CREATININE 1.64* 1.07  CALCIUM 9.3 8.9   Liver Function Tests:  Recent Labs Lab 07/04/15 1834  AST 19  ALT 13*  ALKPHOS 21*  BILITOT 0.5  PROT 5.9*  ALBUMIN 3.4*   CBC:  Recent Labs Lab 07/05/15 0402 07/05/15 0905 07/05/15 1541 07/05/15 2129 07/06/15 0250  WBC 9.5 7.6 8.0 7.1 6.5  HGB 8.5* 8.0* 8.3* 7.9* 7.8*  HCT 28.0* 26.3* 27.0* 26.3* 25.4*  MCV 84.6 84.6 83.3 85.4 84.4  PLT 234 206 214 204 185   Cardiac Enzymes:  Recent Labs Lab 07/04/15 2302 07/05/15 0402 07/05/15 1141  TROPONINI 0.06* 0.06* 0.06*   BNP (last 3 results)  Recent Labs  12/28/14 0134 02/04/15 0413  BNP 504.3* 593.2*    CBG:  Recent Labs Lab 07/05/15 1147 07/05/15 1626 07/05/15  2008 07/05/15 2344 07/06/15 0357  GLUCAP 143* 226* 163* 95 135*       Scheduled Meds: . albuterol  4 mg Oral TID  . allopurinol  300 mg Oral Daily  . cholecalciferol  2,000 Units Oral Daily  . fenofibrate  160 mg Oral Daily  . insulin aspart  0-15 Units Subcutaneous 6 times per day  . isosorbide mononitrate  30 mg Oral Daily  . metoprolol succinate  50 mg Oral Daily  . omega-3 acid ethyl esters  1 g Oral BID  . pantoprazole (PROTONIX) IV  40 mg Intravenous Q12H  . potassium chloride SA  20 mEq Oral Daily  . pregabalin  50 mg Oral TID  . rosuvastatin  20 mg Oral  q1800  . sodium chloride  3 mL Intravenous Q12H  . 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 MD [facility] Pager [phone]. If 7PM-7AM, please contact night-coverage at [email or web address], password [facility] 07/06/2015, 7:22 AM  LOS: 2 days     By signing my name below, I, Rosalie Doctor, attest that this documentation has been prepared under the direction and in the presence of [facility]. MD Electronically Signed: Rosalie Doctor, Scribe. 07/06/2015 9:34am  I, Dr. Kathie Dike, personally performed the services described in this documentaiton. All medical record entries made by the scribe were at my direction and in my presence. I have reviewed the chart and agree that the record reflects my personal performance and is accurate and complete  Kathie Dike, MD, 07/06/2015 9:45 AM

## 2015-07-06 NOTE — Consult Note (Addendum)
Referring Provider: Kathie Dike, MD Primary Care Physician:  [name], MD Primary Gastroenterologist:  Garfield Cornea, MD Consult date/time: 07/05/15 9:06am Notified of consult 07/06/15, showed up on treatment list  Reason for Consultation:  melena  HPI: Tanner Pena is a 74 y.o. 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 Notable for IDA, ischemic colonic ulcers on colonoscopy in 2012. EGD June 2016 by Dr. Michail Sermon was normal. Presented to the hospital couple weeks ago with profound anemia. Hemoglobin 5.5 at that time. Colonoscopy 06/01/2015 showed colonic diverticulosis but otherwise unremarkable. Small bowel capsule endoscopy on 06/08/2015 showed abnormal appearing gastric mucosa with possible erosions. Possible gastric ulcer with dried blood present. There was edematous/polypoid quality to some of the gastric mucosa and some linear erosions present. Small bowel appeared to be normal. EGD last admission on 06/20/15 showed moderate to severe gastritis. Bx benign. Complicated by post-op NSTEMI.  Presented to ER with melena. 4-5 episodes so far. DRE with melanotic heme +stool.  Hgb 8.7. (was 10.4 on 06-23-15) INR 3.96. Hgb down to 7.8 today. No abdominal pain since SMA stent placement. No n/v. BM regular. No heartburn. "feels good". No NSAIDs/ASA. Stopped Plavix after last admission.    Prior to Admission medications   Medication Sig Start Date End Date Taking? Authorizing Provider  albuterol (PROVENTIL) 4 MG tablet Take 4 mg by mouth 3 (three) times daily. 01/20/15  Yes Historical Provider, MD  albuterol-ipratropium (COMBIVENT) 18-103 MCG/ACT inhaler Inhale 1 puff into the lungs 4 (four) times daily. Coughing/ Shortness of Breath   Yes Historical Provider, MD  allopurinol (ZYLOPRIM) 300 MG tablet Take  300 mg by mouth daily.   Yes Historical Provider, MD  cholecalciferol (VITAMIN D) 1000 UNITS tablet Take 2,000 Units by mouth daily.   Yes Historical Provider, MD  Emollient (EUCERIN) lotion Apply 10 mLs topically as needed for dry skin.   Yes Historical Provider, MD  fenofibrate (TRICOR) 145 MG tablet TAKE 1 TABLET BY MOUTH ONCE DAILY FOR CHOLESTEROL. 06/22/15  Yes [name], MD  fish oil-omega-3 fatty acids 1000 MG capsule Take 1 capsule (1 g total) by mouth 2 (two) times daily. 01/22/13  Yes Kristin L Alvstad, RPH-CPP  furosemide (LASIX) 40 MG tablet Take 1 tablet (40 mg total) by mouth daily. RESTART ON SATURDAY, 09/03/14. Patient taking differently: Take 80 mg by mouth daily.  06/02/15  Yes Rexene Alberts, MD  glimepiride (AMARYL) 1 MG tablet Take 1 mg by mouth 2 (two) times daily.   Yes Historical Provider, MD  isosorbide mononitrate (IMDUR) 30 MG 24 hr tablet TAKE ONE TABLET BY MOUTH ONCE DAILY. 06/22/15  Yes Troy Sine, MD  metoprolol succinate (TOPROL-XL) 50 MG 24 hr tablet Take 50 mg by mouth daily. 06/22/15  Yes Historical Provider, MD  nitroGLYCERIN (NITROSTAT) 0.4 MG SL tablet Place 1 tablet (0.4 mg total) under the tongue every 5 (five) minutes as needed for chest pain. 05/17/14  Yes Eileen Stanford, PA-C  oxyCODONE-acetaminophen (PERCOCET) 10-325 MG per tablet Take 1 tablet by mouth every 4 (four) hours as needed for pain. Patient taking differently: Take 1 tablet by mouth every 3 (three) hours as needed for pain.  09/23/14  Yes [name], MD  pantoprazole (PROTONIX) 40 MG tablet Take 1 tablet (40 mg total) by mouth 2 (two) times daily before a meal. 06/23/15  Yes Koleen Nimrod  Deniece Ree, MD  potassium chloride SA (K-DUR,KLOR-CON) 20 MEQ tablet Take 20 mEq by mouth daily.     Yes Historical Provider, MD  pregabalin (LYRICA) 50 MG capsule Take one capsule by mouth twice daily for pains Patient taking differently: Take 50 mg by mouth 3 (three) times daily.  08/23/14  Yes Mahima  Pandey, MD  ramipril (ALTACE) 2.5 MG capsule Take 1 capsule (2.5 mg total) by mouth 2 (two) times daily. Patient taking differently: Take 2.5-5 mg by mouth 2 (two) times daily. Takes '5mg'$  capsule daily in the morning and takes 2.'5mg'$  capsule at bedtime 06/02/15  Yes Rexene Alberts, MD  rosuvastatin (CRESTOR) 20 MG tablet TAKE (1) TABLET BY MOUTH AT BEDTIME FOR CHOLESTEROL. 06/22/15  Yes [name], MD  sitaGLIPtin (JANUVIA) 50 MG tablet Take 50 mg by mouth daily.   Yes Historical Provider, MD  vitamin C (ASCORBIC ACID) 500 MG tablet Take 500 mg by mouth daily.   Yes Historical Provider, MD  warfarin (COUMADIN) 5 MG tablet Take 1 tablet by mouth daily or as directed by coumadin clinic. Patient taking differently: Take 2.5-5 mg by mouth. 2.'5mg'$  on Mondays and Fridays only. Takes '5mg'$  on all other day 06/02/15  Yes Rexene Alberts, MD  ferrous sulfate 325 (65 FE) MG EC tablet Take 1 tablet (325 mg total) by mouth daily with breakfast. Patient not taking: Reported on 06/08/2015 06/02/15   Rexene Alberts, MD  insulin detemir (LEVEMIR) 100 UNIT/ML injection Inject 0.1 mLs (10 Units total) into the skin at bedtime. Patient not taking: Reported on 06/27/2015 06/23/15   Erline Hau, MD  metoprolol succinate (TOPROL-XL) 25 MG 24 hr tablet Take 1 tablet (25 mg total) by mouth daily. Patient not taking: Reported on 07/04/2015 06/23/15   Erline Hau, MD    Current Facility-Administered Medications  Medication Dose Route Frequency Provider Last Rate Last Dose  . albuterol (PROVENTIL) tablet 4 mg  4 mg Oral TID Orvan Falconer, MD   4 mg at 07/05/15 2107  . allopurinol (ZYLOPRIM) tablet 300 mg  300 mg Oral Daily Orvan Falconer, MD   300 mg at 07/05/15 0960  . cholecalciferol (VITAMIN D) tablet 2,000 Units  2,000 Units Oral Daily Orvan Falconer, MD   2,000 Units at 07/05/15 [phone]  . fenofibrate tablet 160 mg  160 mg Oral Daily Orvan Falconer, MD   160 mg at 07/05/15 9811  . hydrocerin (EUCERIN) cream   Topical BID  PRN Orvan Falconer, MD      . insulin aspart (novoLOG) injection 0-15 Units  0-15 Units Subcutaneous 6 times per day Orvan Falconer, MD   1 Units at 07/06/15 0500  . ipratropium-albuterol (DUONEB) 0.5-2.5 (3) MG/3ML nebulizer solution 3 mL  3 mL Nebulization QID PRN Shaune Leeks, RRT      . isosorbide mononitrate (IMDUR) 24 hr tablet 30 mg  30 mg Oral Daily Orvan Falconer, MD   30 mg at 07/05/15 9147  . metoprolol succinate (TOPROL-XL) 24 hr tablet 50 mg  50 mg Oral Daily Orvan Falconer, MD   50 mg at 07/05/15 8295  . omega-3 acid ethyl esters (LOVAZA) capsule 1 g  1 g Oral BID Orvan Falconer, MD   1 g at 07/05/15 2103  . oxyCODONE-acetaminophen (PERCOCET/ROXICET) 5-325 MG per tablet 1 tablet  1 tablet Oral Q4H PRN Orvan Falconer, MD   1 tablet at 07/06/15 0316   And  . oxyCODONE (Oxy IR/ROXICODONE) immediate release tablet 5 mg  5 mg Oral Q4H  PRN Orvan Falconer, MD   5 mg at 07/06/15 0316  . pantoprazole (PROTONIX) injection 40 mg  40 mg Intravenous Q12H Orvan Falconer, MD   40 mg at 07/05/15 2102  . potassium chloride SA (K-DUR,KLOR-CON) CR tablet 20 mEq  20 mEq Oral Daily Orvan Falconer, MD   20 mEq at 07/05/15 0923  . pregabalin (LYRICA) capsule 50 mg  50 mg Oral TID Orvan Falconer, MD   50 mg at 07/05/15 2103  . rosuvastatin (CRESTOR) tablet 20 mg  20 mg Oral q1800 Orvan Falconer, MD   20 mg at 07/05/15 1747  . sodium chloride 0.9 % injection 3 mL  3 mL Intravenous Q12H Orvan Falconer, MD   3 mL at 07/05/15 2105  . Warfarin - Pharmacist Dosing Inpatient   Does not apply Q24H Orvan Falconer, MD   Stopped at 07/05/15 1600    Allergies as of 07/04/2015  . (No Known Allergies)    Past Medical History  Diagnosis Date  . Type 2 diabetes mellitus ([place]) 2007  . Essential hypertension   . Gout   . Hypercholesteremia   . Peripheral vascular disease ([place])     Lower extremity PCI/stenting  . COPD (chronic obstructive pulmonary disease) ([place])   . S/P aortic valve replacement 1990    a. St. Jude  . Chronic back pain   . Dysphagia   . Neuromuscular disorder  ([place])   . Peripheral neuropathy ([place])   . Critical lower limb ischemia   . CAD (coronary artery disease)     a. 05/13/14 [name] s/p overlapping DESx2 to SVG to RCA. b.  s/p CABG '90 with redo '94 & stent to RCA SVG in 2005  . Chronic toe ulcer ([place])     a. Left foot  . Chronic systolic heart failure ([place])   . History of kidney stones   . GERD (gastroesophageal reflux disease)   . Arthritis   . Sleep apnea   . Myocardial infarction ([place])   . GI bleeding 05/31/2015    Source not identified.  . Acute blood loss anemia 02/2015 & 05/2015    Hemoglobin 5.5 on 05/31/15; status post transfusion.  . CHF (congestive heart failure) [facility])     Past Surgical History  Procedure Laterality Date  . Aortic valve replacement  1990    St. Jude  . Rotator cuff repair      right  . Cataract extraction      bilateral  . Coronary stent placement  2005    RCA vein graft A 3.0x13.0 TAXUS stent was then placed int he vessel a Viva 3.0x4.0 (perfusion balloon was made ready it was placed through the entire lenght of the stent  . Peripheral vascular procedures lower extremities      right external iliac  artery PTA and stenting as well as bilateral SFA intervention remotely. Repeat procedures in 2011 bilaterally  . Coronary artery bypass graft  1994    6 vessels  . [name]  06/13/2011    Procedure: Venia Minks DILATION;  Surgeon: [name], MD;  Location: [facility];  Service: Endoscopy;  Laterality: N/A;  Dilated to 56.   . Angioplasty illiac artery    . Back surgery  [id number]    2  . Eye surgery    . Amputation Left 07/13/2014    Procedure: Transmetatarsal Amputation;  Surgeon: [name], MD;  Location: [place];  Service: Orthopedics;  Laterality: Left;  . Lower extremity angiogram N/A 02/15/2013  Procedure: LOWER EXTREMITY ANGIOGRAM;  Surgeon: [name], MD;  [facility] C[facility] LAB;  Service: Cardiovascular;  Laterality: N/A;  . Left heart catheterization with coronary angiogram N/A  05/11/2014    Procedure: LEFT HEART CATHETERIZATION WITH CORONARY ANGIOGRAM;  Surgeon: [name], MD;  Location: [facility] CATH LAB;  Service: Cardiovascular;  Laterality: N/A;  . Percutaneous coronary stent intervention (pci-s) N/A 05/13/2014    Procedure: PERCUTANEOUS CORONARY STENT INTERVENTION (PCI-S);  Surgeon: [name], MD;  Location: [facility] CATH LAB;  Service: Cardiovascular;  Laterality: N/A;  . Lower extremity angiogram N/A 06/06/2014    Procedure: LOWER EXTREMITY ANGIOGRAM;  Surgeon: [name], MD;  Location: [facility] CATH LAB;  Service: Cardiovascular;  Laterality: N/A;  . Amputation Left 08/20/2014    Procedure: Revision Transmetatarsal Amputation versus Below Knee Amputation;  Surgeon: [name], MD;  Location: [place];  Service: Orthopedics;  Laterality: Left;  . Stump revision Left 09/23/2014    Procedure: Revision Left Below Knee Amputation;  Surgeon: [name], MD;  Location: [place];  Service: Orthopedics;  Laterality: Left;  . Stump revision Left 10/13/2014    Procedure: REVISION LEFT BELOW KNEE AMPUTATION STUMP;  Surgeon: [name], MD;  Location: [facility];  Service: Orthopedics;  Laterality: Left;  . Esophagogastroduodenoscopy N/A 02/09/2015    DR. Schooler: Normal EGD  . Esophagogastroduodenoscopy  05/2011    Dr. [name]: s/p esophageal dilation, antral erosions/nodularity with benign biopsies  . Colonoscopy  05/2011    Dr. [name]: benign rectal polyp, left sided tics, ascending colonic ulcers (path c/w ischemia)  . Colonoscopy with propofol N/A 06/01/2015    RMR: normal appearing rectal mucosa. Scattered left-sided diverticula. the remainder of the colonic mucosa appeared normal. the distal 5 cm of terminal ileal mucosa also appeared normal. retroflexion was performed.   Freda Munro capsule study N/A 06/08/2015    Procedure: GIVENS CAPSULE STUDY;  Surgeon: [name], MD;  Location: [facility];  Service: Endoscopy;  Laterality: N/A;  0700   .  Esophagogastroduodenoscopy (egd) with propofol N/A 06/20/2015    Procedure: ESOPHAGOGASTRODUODENOSCOPY (EGD) WITH PROPOFOL;  Surgeon: Danie Binder, MD;  Location: [facility];  Service: Endoscopy;  Laterality: N/A;  . Esophageal biopsy N/A 06/20/2015    Procedure: BIOPSY;  Surgeon: Danie Binder, MD;  Location: [facility];  Service: Endoscopy;  Laterality: N/A;    Family History  Problem Relation Age of Onset  . Colon cancer Neg Hx   . Liver disease Neg Hx     Social History   Social History  . Marital Status: Legally Separated    Spouse Name: N/A  . Number of Children: 5  . Years of Education: N/A   Occupational History  . retired     Stage manager   Social History Main Topics  . Smoking status: Former Smoker -- 1.00 packs/day for 55 years    Types: Cigarettes    Start date: 08/19/1953    Quit date: 05/07/2014  . Smokeless tobacco: Current User    Types: Chew    Last Attempt to Quit: 08/20/1995     Comment: Has quit on 3 occasions. Counseling given today 5-10 minutes   I am more than likely going to quit "  . Alcohol Use: No     Comment: Socially, sometimes 12 ounce beer daily, may go month without/no whiskey  . Drug Use: No  . Sexual Activity: Not Currently   Other Topics Concern  . Not on file  Social History Narrative   Has 3 daughters   Has 2 sons     ROS:  General: Negative for anorexia, weight loss, fever, chills, fatigue, weakness. Eyes: Negative for vision changes.  ENT: Negative for hoarseness, difficulty swallowing , nasal congestion. CV: Negative for chest pain, angina, palpitations,  peripheral edema. +chronic doe Respiratory: Negative for dyspnea at rest,  cough, sputum, wheezing. +chronic doe GI: See history of present illness. GU:  Negative for dysuria, hematuria, urinary incontinence, urinary frequency, nocturnal urination.  MS: Negative for joint pain, low back pain.  Derm: Negative for rash or itching.  Neuro: Negative for weakness, abnormal  sensation, seizure, frequent headaches, memory loss, confusion.  Psych: Negative for anxiety, depression, suicidal ideation, hallucinations.  Endo: Negative for unusual weight change.  Heme: Negative for bruising or bleeding. Allergy: Negative for rash or hives.       Physical Examination: Vital signs in last 24 hours: Temp:  [96.8 F (36 C)-98.2 F (36.8 C)] 98.2 F (36.8 C) (11/17 0400) Pulse Rate:  [52-73] 71 (11/17 0400) Resp:  [11-23] 22 (11/17 0400) BP: (85-176)/(33-76) 136/50 mmHg (11/17 0400) SpO2:  [97 %-100 %] 99 % (11/17 0400) Weight:  [199 lb 4.7 oz (90.4 kg)] 199 lb 4.7 oz (90.4 kg) (11/17 0500) Last BM Date: 07/05/15  General: Well-nourished, well-developed in no acute distress.  Head: Normocephalic, atraumatic.   Eyes: Conjunctiva pink, no icterus. Mouth: Oropharyngeal mucosa moist and pink , no lesions erythema or exudate. Neck: Supple without thyromegaly, masses, or lymphadenopathy.  Lungs: Clear to auscultation bilaterally.  Heart: Regular rate and rhythm, no murmurs rubs or gallops.  Abdomen: Bowel sounds are normal, nontender, nondistended, no hepatosplenomegaly or masses, no abdominal bruits or    hernia , no rebound or guarding.   Rectal: done in er Extremities: No lower extremity edema, clubbing, deformity on RLE. S/p left BKA.[name]  Neuro: Alert and oriented x 4 , grossly normal neurologically.  Skin: Warm and dry, no rash or jaundice.   Psych: Alert and cooperative, normal mood and affect.        Intake/Output from previous day: 11/16 0701 - 11/17 0700 In: 780 [P.O.:780] Out: 400 [Urine:400] Intake/Output this shift:    Lab Results: CBC  Recent Labs  07/05/15 1541 07/05/15 2129 07/06/15 0250  WBC 8.0 7.1 6.5  HGB 8.3* 7.9* 7.8*  HCT 27.0* 26.3* 25.4*  MCV 83.3 85.4 84.4  PLT 214 204 185   BMET  Recent Labs  07/04/15 1834 07/06/15 0250  NA 140 141  K 4.9 4.2  CL 106 109  CO2 26 24  GLUCOSE 136* 111*  BUN 61* 44*  CREATININE 1.64*  1.07  CALCIUM 9.3 8.9   LFT  Recent Labs  07/04/15 1834  BILITOT 0.5  ALKPHOS 21*  AST 19  ALT 13*  PROT 5.9*  ALBUMIN 3.4*    Lipase No results for input(s): LIPASE in the last 72 hours.  PT/INR  Recent Labs  07/04/15 1834 07/05/15 0402 07/06/15 0250  LABPROT 37.7* 35.4* 37.3*  INR 3.96* 3.64* 3.91*      Imaging Studies: [place] Art/ven Flow Abd Pelv Doppler  06/06/2015  CLINICAL DATA:  74 year old male with a history of chronic mesenteric ischemia. Status post superior mesenteric artery stent March 06, 2015. He presents today for baseline mesenteric duplex and evaluation of superior mesenteric artery stent. NPO status has been recommended. The patient is not NPO. Cardiovascular risk factors include hypertension, known coronary artery disease, known vascular disease, diabetes, history of tobacco  use EXAM: MESENTERIC ARTERIAL DUPLEX TECHNIQUE: Color and duplex Doppler ultrasound was performed to evaluate the mesenteric arteries. COMPARISON:  Angiogram 03/06/2015, CT 03/02/2015, 03/01/2015 FINDINGS: Celiac artery:  Bowel gas obscures visualization Splenic artery:  Bowel gas obscures visualization Hepatic artery:  Bowel gas obscures visualization Proximal velocity 73 centimeter/second Proximal Aorta:  2.7 cm ; 75 centimeter/second velocity Mid aorta:  2.2 cm Distal aorta:  1.9 cm IMPRESSION: Limited mesenteric duplex as the bowel gas limits visualization of the celiac artery and superior mesenteric artery. SMA stent is not evaluated on this study. No evidence of abdominal aortic aneurysm. Signed, Dulcy Fanny. Earleen Newport, DO [facility] Radiology Electronically Signed   By: Corrie Mckusick D.O.   On: 06/06/2015 14:19   Dg Chest Port 1 View  06/15/2015  CLINICAL DATA:  Abdominal pain.  Admitted for recurrent GI bleed. EXAM: PORTABLE CHEST 1 VIEW COMPARISON:  Radiograph 05/30/2015 FINDINGS: Sternotomy wires overlie stable enlarged cardiac silhouette.  Chronic elevation LEFT hemidiaphragm. No effusion infiltrate pneumothorax. IMPRESSION: No acute cardiopulmonary findings. Cardiomegaly and elevation of LEFT hemidiaphragm Electronically Signed   By: Suzy Bouchard M.D.   On: 06/15/2015 16:05   Dg Abd Portable 1v  06/15/2015  CLINICAL DATA:  Abdominal pain.  Recurrent GI bleeding. EXAM: PORTABLE ABDOMEN - 1 VIEW COMPARISON:  None. FINDINGS: The bowel gas pattern is normal. Extensive aortic and visceral arterial vascular calcification noted. IMPRESSION: No acute findings. Electronically Signed   By: Earle Gell M.D.   On: 06/15/2015 16:10  [4 week]   Impression: 74 y/o male with multiple medical problems including need for chronic anticoagulation in the setting of aortic prosthetic heart valve, PVD/CAD who presents with recurrent UGI bleeding. EGD couple of weeks ago with severe gastritis, bx negative for H.pylori. Previous colonoscopy and small bowel capsule 05/2015 without findings from small bowel or colon to explain GI bleeding.  Presented this time on coumadin with INR 3.96, no longer on Plavix. Hgb has drifted downward.   Course complicated last admission with NSTEMI after EGD.  Plan: 1. Discussed with Dr. [name]. Recommend tight control of INR. PPI and Carafate. Would avoid invasive testing at this time with recent NSTEMI unless he develops significant bleeding which requires emergent management.  2. Consider RBC tagged study/GI bleeding scan if ongoing/recurrent acute bleeding.   We would like to thank you for the opportunity to participate in the care of Tanner Pena.  Laureen Ochs. Bernarda Caffey [facility] [phone] 11/17/201610:08 AM     LOS: 2 days    Attending note:  Patient seen and examined. Clinically, no further bleeding today. Agree with need for tight control of INR. Will advance to a carbohydrate modified low-residue diet.

## 2015-07-06 NOTE — Consult Note (Addendum)
   [facility] CM Inpatient Consult   07/06/2015  GARRET TEALE 22-Dec-1940 [phone]   Patient is currently active with [facility] for chronic disease management services.  Patient has been engaged by a [employer].  Our community based plan of care has focused on disease management and community resource support.  Patient will receive a post discharge transition of care call and will be evaluated for monthly home visits for assessments and disease process education. Inpatient Case Manager aware that [facility] following. Of note, [facility] Care Management services does not replace or interfere with any services that are arranged by inpatient case management or social work.   For additional questions or referrals please contact:  Royetta Crochet. Laymond Purser, RN, BSN, [facility] Liaison [phone]

## 2015-07-06 NOTE — Progress Notes (Signed)
Pt a/o.vss. IV patent. No complaints of any distress. Report given to [facility], Therapist, sports. Pt to be transferred via wheelchair with nursing staff.

## 2015-07-06 NOTE — Progress Notes (Signed)
ANTICOAGULATION CONSULT NOTE  Pharmacy Consult for Warfarin Indication: Mechanical Valve  No Known Allergies  Patient Measurements: Height: '5\' 10"'$  (177.8 cm) Weight: 199 lb 4.7 oz (90.4 kg) IBW/kg (Calculated) : 73  Vital Signs: Temp: 97.5 F (36.4 C) (11/17 0754) Temp Source: Oral (11/17 0754) BP: 125/51 mmHg (11/17 0700) Pulse Rate: 58 (11/17 0700)  Labs:  Recent Labs  07/04/15 1834  07/04/15 2302 07/05/15 0402  07/05/15 1141 07/05/15 1541 07/05/15 2129 07/06/15 0250  HGB 8.7*  < >  --  8.5*  < >  --  8.3* 7.9* 7.8*  HCT 28.3*  < >  --  28.0*  < >  --  27.0* 26.3* 25.4*  PLT 226  < >  --  234  < >  --  214 204 185  LABPROT 37.7*  --   --  35.4*  --   --   --   --  37.3*  INR 3.96*  --   --  3.64*  --   --   --   --  3.91*  CREATININE 1.64*  --   --   --   --   --   --   --  1.07  TROPONINI  --   --  0.06* 0.06*  --  0.06*  --   --   --   < > = values in this interval not displayed. Estimated Creatinine Clearance: 68.5 mL/min (by C-G formula based on Cr of 1.07).  Medical History: Past Medical History  Diagnosis Date  . Type 2 diabetes mellitus ([place]) 2007  . Essential hypertension   . Gout   . Hypercholesteremia   . Peripheral vascular disease ([place])     Lower extremity PCI/stenting  . COPD (chronic obstructive pulmonary disease) ([place])   . S/P aortic valve replacement 1990    a. St. Jude  . Chronic back pain   . Dysphagia   . Neuromuscular disorder ([place])   . Peripheral neuropathy ([place])   . Critical lower limb ischemia   . CAD (coronary artery disease)     a. 05/13/14 [name] s/p overlapping DESx2 to SVG to RCA. b.  s/p CABG '90 with redo '94 & stent to RCA SVG in 2005  . Chronic toe ulcer ([place])     a. Left foot  . Chronic systolic heart failure ([place])   . History of kidney stones   . GERD (gastroesophageal reflux disease)   . Arthritis   . Sleep apnea   . Myocardial infarction ([place])   . GI bleeding 05/31/2015    Source not identified.  . Acute blood loss  anemia 02/2015 & 05/2015    Hemoglobin 5.5 on 05/31/15; status post transfusion.  . CHF (congestive heart failure) (HCC)    Medications:  Prescriptions prior to admission  Medication Sig Dispense Refill Last Dose  . albuterol (PROVENTIL) 4 MG tablet Take 4 mg by mouth 3 (three) times daily.   07/04/2015 at Unknown time  . albuterol-ipratropium (COMBIVENT) 18-103 MCG/ACT inhaler Inhale 1 puff into the lungs 4 (four) times daily. Coughing/ Shortness of Breath   07/04/2015 at Unknown time  . allopurinol (ZYLOPRIM) 300 MG tablet Take 300 mg by mouth daily.   07/04/2015 at Unknown time  . cholecalciferol (VITAMIN D) 1000 UNITS tablet Take 2,000 Units by mouth daily.   07/04/2015 at Unknown time  . Emollient (EUCERIN) lotion Apply 10 mLs topically as needed for dry skin.   Past Week at Unknown time  . fenofibrate (  TRICOR) 145 MG tablet TAKE 1 TABLET BY MOUTH ONCE DAILY FOR CHOLESTEROL. 30 tablet 0 07/04/2015 at Unknown time  . fish oil-omega-3 fatty acids 1000 MG capsule Take 1 capsule (1 g total) by mouth 2 (two) times daily.   07/04/2015 at Unknown time  . furosemide (LASIX) 40 MG tablet Take 1 tablet (40 mg total) by mouth daily. RESTART ON SATURDAY, 09/03/14. (Patient taking differently: Take 80 mg by mouth daily. )   07/04/2015 at Unknown time  . glimepiride (AMARYL) 1 MG tablet Take 1 mg by mouth 2 (two) times daily.   07/04/2015 at Unknown time  . isosorbide mononitrate (IMDUR) 30 MG 24 hr tablet TAKE ONE TABLET BY MOUTH ONCE DAILY. 30 tablet 0 07/04/2015 at Unknown time  . metoprolol succinate (TOPROL-XL) 50 MG 24 hr tablet Take 50 mg by mouth daily.   07/04/2015 at 8-900a  . nitroGLYCERIN (NITROSTAT) 0.4 MG SL tablet Place 1 tablet (0.4 mg total) under the tongue every 5 (five) minutes as needed for chest pain. 25 tablet 12 unknown  . oxyCODONE-acetaminophen (PERCOCET) 10-325 MG per tablet Take 1 tablet by mouth every 4 (four) hours as needed for pain. (Patient taking differently: Take 1 tablet  by mouth every 3 (three) hours as needed for pain. ) 30 tablet 0 Past Week at Unknown time  . pantoprazole (PROTONIX) 40 MG tablet Take 1 tablet (40 mg total) by mouth 2 (two) times daily before a meal. 60 tablet 2 07/04/2015 at morning  . potassium chloride SA (K-DUR,KLOR-CON) 20 MEQ tablet Take 20 mEq by mouth daily.     07/04/2015 at Unknown time  . pregabalin (LYRICA) 50 MG capsule Take one capsule by mouth twice daily for pains (Patient taking differently: Take 50 mg by mouth 3 (three) times daily. ) 60 capsule 5 07/04/2015 at Unknown time  . ramipril (ALTACE) 2.5 MG capsule Take 1 capsule (2.5 mg total) by mouth 2 (two) times daily. (Patient taking differently: Take 2.5-5 mg by mouth 2 (two) times daily. Takes '5mg'$  capsule daily in the morning and takes 2.'5mg'$  capsule at bedtime) 60 capsule 3 07/04/2015 at Unknown time  . rosuvastatin (CRESTOR) 20 MG tablet TAKE (1) TABLET BY MOUTH AT BEDTIME FOR CHOLESTEROL. 30 tablet 0 07/03/2015 at Unknown time  . sitaGLIPtin (JANUVIA) 50 MG tablet Take 50 mg by mouth daily.   07/04/2015 at Unknown time  . vitamin C (ASCORBIC ACID) 500 MG tablet Take 500 mg by mouth daily.   07/04/2015 at Unknown time  . warfarin (COUMADIN) 5 MG tablet Take 1 tablet by mouth daily or as directed by coumadin clinic. (Patient taking differently: Take 2.5-5 mg by mouth. 2.'5mg'$  on Mondays and Fridays only. Takes '5mg'$  on all other day) 30 tablet 1 07/03/2015 at [place]  . ferrous sulfate 325 (65 FE) MG EC tablet Take 1 tablet (325 mg total) by mouth daily with breakfast. (Patient not taking: Reported on 06/08/2015)   Not Taking  . insulin detemir (LEVEMIR) 100 UNIT/ML injection Inject 0.1 mLs (10 Units total) into the skin at bedtime. (Patient not taking: Reported on 06/27/2015) 10 mL 11 Not Taking  . metoprolol succinate (TOPROL-XL) 25 MG 24 hr tablet Take 1 tablet (25 mg total) by mouth daily. (Patient not taking: Reported on 07/04/2015) 30 tablet 2  at 8-900a   Assessment: Okay for  Protocol, elevated INR.  H/H trending down but stable. Home dose '5mg'$  daily except 2.'5mg'$  on Mondays.  Recent Melena / Ulcer / Anemia  Goal of Therapy:  INR 2.5 - 3.5   Plan:  No Warfarin today due to elevated INR and low H/H Daily PT/INR Monitor CBC, s/sx of bleeding complications  [place] Crane, Fuquan Wilson A 07/06/2015,8:17 AM

## 2015-07-06 NOTE — Progress Notes (Signed)
Tanner Pena arrived to rm # [id number] at 40 from ICU.  Report received from [place], [place] on ICU.  Dr. Roderic Palau notified of Pt. Arrival.

## 2015-07-07 ENCOUNTER — Encounter (HOSPITAL_COMMUNITY): Payer: Self-pay

## 2015-07-07 ENCOUNTER — Observation Stay (HOSPITAL_COMMUNITY): Payer: Medicare Other

## 2015-07-07 DIAGNOSIS — I2510 Atherosclerotic heart disease of native coronary artery without angina pectoris: Secondary | ICD-10-CM | POA: Diagnosis present

## 2015-07-07 DIAGNOSIS — K2901 Acute gastritis with bleeding: Secondary | ICD-10-CM | POA: Diagnosis not present

## 2015-07-07 DIAGNOSIS — N179 Acute kidney failure, unspecified: Secondary | ICD-10-CM | POA: Diagnosis present

## 2015-07-07 DIAGNOSIS — K219 Gastro-esophageal reflux disease without esophagitis: Secondary | ICD-10-CM | POA: Diagnosis present

## 2015-07-07 DIAGNOSIS — I5043 Acute on chronic combined systolic (congestive) and diastolic (congestive) heart failure: Secondary | ICD-10-CM | POA: Diagnosis not present

## 2015-07-07 DIAGNOSIS — Z5181 Encounter for therapeutic drug level monitoring: Secondary | ICD-10-CM | POA: Diagnosis not present

## 2015-07-07 DIAGNOSIS — Z951 Presence of aortocoronary bypass graft: Secondary | ICD-10-CM | POA: Diagnosis not present

## 2015-07-07 DIAGNOSIS — Z952 Presence of prosthetic heart valve: Secondary | ICD-10-CM | POA: Diagnosis not present

## 2015-07-07 DIAGNOSIS — E1151 Type 2 diabetes mellitus with diabetic peripheral angiopathy without gangrene: Secondary | ICD-10-CM | POA: Diagnosis present

## 2015-07-07 DIAGNOSIS — J449 Chronic obstructive pulmonary disease, unspecified: Secondary | ICD-10-CM | POA: Diagnosis not present

## 2015-07-07 DIAGNOSIS — K921 Melena: Secondary | ICD-10-CM | POA: Diagnosis present

## 2015-07-07 DIAGNOSIS — Z89512 Acquired absence of left leg below knee: Secondary | ICD-10-CM | POA: Diagnosis not present

## 2015-07-07 DIAGNOSIS — E1122 Type 2 diabetes mellitus with diabetic chronic kidney disease: Secondary | ICD-10-CM | POA: Diagnosis not present

## 2015-07-07 DIAGNOSIS — K2990 Gastroduodenitis, unspecified, without bleeding: Secondary | ICD-10-CM | POA: Diagnosis present

## 2015-07-07 DIAGNOSIS — I110 Hypertensive heart disease with heart failure: Secondary | ICD-10-CM | POA: Diagnosis present

## 2015-07-07 DIAGNOSIS — E7800 Pure hypercholesterolemia, unspecified: Secondary | ICD-10-CM | POA: Diagnosis present

## 2015-07-07 DIAGNOSIS — D62 Acute posthemorrhagic anemia: Secondary | ICD-10-CM | POA: Diagnosis not present

## 2015-07-07 DIAGNOSIS — Z87891 Personal history of nicotine dependence: Secondary | ICD-10-CM | POA: Diagnosis not present

## 2015-07-07 DIAGNOSIS — J441 Chronic obstructive pulmonary disease with (acute) exacerbation: Secondary | ICD-10-CM | POA: Diagnosis present

## 2015-07-07 DIAGNOSIS — I255 Ischemic cardiomyopathy: Secondary | ICD-10-CM | POA: Diagnosis present

## 2015-07-07 DIAGNOSIS — Z955 Presence of coronary angioplasty implant and graft: Secondary | ICD-10-CM | POA: Diagnosis not present

## 2015-07-07 DIAGNOSIS — G4730 Sleep apnea, unspecified: Secondary | ICD-10-CM | POA: Diagnosis present

## 2015-07-07 DIAGNOSIS — I214 Non-ST elevation (NSTEMI) myocardial infarction: Secondary | ICD-10-CM | POA: Diagnosis present

## 2015-07-07 DIAGNOSIS — M1990 Unspecified osteoarthritis, unspecified site: Secondary | ICD-10-CM | POA: Diagnosis present

## 2015-07-07 DIAGNOSIS — Z7901 Long term (current) use of anticoagulants: Secondary | ICD-10-CM

## 2015-07-07 DIAGNOSIS — K5900 Constipation, unspecified: Secondary | ICD-10-CM | POA: Diagnosis present

## 2015-07-07 DIAGNOSIS — K922 Gastrointestinal hemorrhage, unspecified: Secondary | ICD-10-CM | POA: Diagnosis not present

## 2015-07-07 LAB — BASIC METABOLIC PANEL
ANION GAP: 2 — AB (ref 5–15)
BUN: 23 mg/dL — ABNORMAL HIGH (ref 6–20)
CALCIUM: 9 mg/dL (ref 8.9–10.3)
CO2: 26 mmol/L (ref 22–32)
CREATININE: 1.11 mg/dL (ref 0.61–1.24)
Chloride: 110 mmol/L (ref 101–111)
GLUCOSE: 155 mg/dL — AB (ref 65–99)
Potassium: 4.2 mmol/L (ref 3.5–5.1)
Sodium: 138 mmol/L (ref 135–145)

## 2015-07-07 LAB — GLUCOSE, CAPILLARY
GLUCOSE-CAPILLARY: 192 mg/dL — AB (ref 65–99)
GLUCOSE-CAPILLARY: 173 mg/dL — AB (ref 65–99)
Glucose-Capillary: 148 mg/dL — ABNORMAL HIGH (ref 65–99)
Glucose-Capillary: 159 mg/dL — ABNORMAL HIGH (ref 65–99)
Glucose-Capillary: 163 mg/dL — ABNORMAL HIGH (ref 65–99)
Glucose-Capillary: 207 mg/dL — ABNORMAL HIGH (ref 65–99)

## 2015-07-07 LAB — CBC
HEMATOCRIT: 24.5 % — AB (ref 39.0–52.0)
Hemoglobin: 7.2 g/dL — ABNORMAL LOW (ref 13.0–17.0)
MCH: 25.5 pg — ABNORMAL LOW (ref 26.0–34.0)
MCHC: 29.4 g/dL — AB (ref 30.0–36.0)
MCV: 86.9 fL (ref 78.0–100.0)
PLATELETS: 184 10*3/uL (ref 150–400)
RBC: 2.82 MIL/uL — ABNORMAL LOW (ref 4.22–5.81)
RDW: 20.1 % — AB (ref 11.5–15.5)
WBC: 4.4 10*3/uL (ref 4.0–10.5)

## 2015-07-07 LAB — PROTIME-INR
INR: 4.51 — AB (ref 0.00–1.49)
Prothrombin Time: 41.6 seconds — ABNORMAL HIGH (ref 11.6–15.2)

## 2015-07-07 MED ORDER — TECHNETIUM TC 99M-LABELED RED BLOOD CELLS IV KIT
25.0000 | PACK | Freq: Once | INTRAVENOUS | Status: AC | PRN
  Administered 2015-07-07: 27 via INTRAVENOUS

## 2015-07-07 MED ORDER — SODIUM CHLORIDE 0.9 % IV SOLN
Freq: Once | INTRAVENOUS | Status: AC
  Administered 2015-07-07: 13:00:00 via INTRAVENOUS

## 2015-07-07 MED ORDER — HEPARIN SOD (PORK) LOCK FLUSH 100 UNIT/ML IV SOLN
INTRAVENOUS | Status: AC
  Filled 2015-07-07: qty 5

## 2015-07-07 NOTE — Care Management Note (Signed)
Case Management Note  Patient Details  Name: MIHAILO SAGE MRN: [id number] Date of Birth: Aug 21, 1940  Subjective/Objective:                    Action/Plan:   Expected Discharge Date:  07/07/15               Expected Discharge Plan:  Home/Self Care  In-House Referral:  NA  Discharge planning Services  CM Consult  Post Acute Care Choice:  NA Choice offered to:  NA  DME Arranged:    DME Agency:     HH Arranged:    [place] Agency:     Status of Service:  Completed, signed off  Medicare Important Message Given:    Date Medicare IM Given:    Medicare IM give by:    Date Additional Medicare IM Given:    Additional Medicare Important Message give by:     If discussed at [place] of Stay Meetings, dates discussed:    Additional Comments: Pt to receive blood today. Will continue to monitor for discharge planning needs. Christinia Gully [place], RN 07/07/2015, 12:58 PM

## 2015-07-07 NOTE — Progress Notes (Signed)
ANTICOAGULATION CONSULT NOTE  Pharmacy Consult for Warfarin Indication: Mechanical Valve  No Known Allergies  Patient Measurements: Height: '5\' 10"'$  (177.8 cm) Weight: 199 lb 4.7 oz (90.4 kg) IBW/kg (Calculated) : 73  Vital Signs: Temp: 98.3 F (36.8 C) (11/18 1414) Temp Source: Oral (11/18 1414) BP: 132/58 mmHg (11/18 1414) Pulse Rate: 67 (11/18 1414)  Labs:  Recent Labs  07/04/15 1834  07/04/15 2302 07/05/15 0402  07/05/15 1141  07/05/15 2129 07/06/15 0250 07/07/15 0715  HGB 8.7*  < >  --  8.5*  < >  --   < > 7.9* 7.8* 7.2*  HCT 28.3*  < >  --  28.0*  < >  --   < > 26.3* 25.4* 24.5*  PLT 226  < >  --  234  < >  --   < > 204 185 184  LABPROT 37.7*  --   --  35.4*  --   --   --   --  37.3* 41.6*  INR 3.96*  --   --  3.64*  --   --   --   --  3.91* 4.51*  CREATININE 1.64*  --   --   --   --   --   --   --  1.07  --   TROPONINI  --   --  0.06* 0.06*  --  0.06*  --   --   --   --   < > = values in this interval not displayed. Estimated Creatinine Clearance: 68.5 mL/min (by C-G formula based on Cr of 1.07).  Medical History: Past Medical History  Diagnosis Date  . Type 2 diabetes mellitus ([place]) 2007  . Essential hypertension   . Gout   . Hypercholesteremia   . Peripheral vascular disease ([place])     Lower extremity PCI/stenting  . COPD (chronic obstructive pulmonary disease) ([place])   . S/P aortic valve replacement 1990    a. St. Jude  . Chronic back pain   . Dysphagia   . Neuromuscular disorder ([place])   . Peripheral neuropathy ([place])   . Critical lower limb ischemia   . CAD (coronary artery disease)     a. 05/13/14 [name] s/p overlapping DESx2 to SVG to RCA. b.  s/p CABG '90 with redo '94 & stent to RCA SVG in 2005  . Chronic toe ulcer ([place])     a. Left foot  . Chronic systolic heart failure ([place])   . History of kidney stones   . GERD (gastroesophageal reflux disease)   . Arthritis   . Sleep apnea   . Myocardial infarction ([place])   . GI bleeding 05/31/2015   Source not identified.  . Acute blood loss anemia 02/2015 & 05/2015    Hemoglobin 5.5 on 05/31/15; status post transfusion.  . CHF (congestive heart failure) ([place])   . Asthma    Medications:  Prescriptions prior to admission  Medication Sig Dispense Refill Last Dose  . albuterol (PROVENTIL) 4 MG tablet Take 4 mg by mouth 3 (three) times daily.   07/04/2015 at Unknown time  . albuterol-ipratropium (COMBIVENT) 18-103 MCG/ACT inhaler Inhale 1 puff into the lungs 4 (four) times daily. Coughing/ Shortness of Breath   07/04/2015 at Unknown time  . allopurinol (ZYLOPRIM) 300 MG tablet Take 300 mg by mouth daily.   07/04/2015 at Unknown time  . cholecalciferol (VITAMIN D) 1000 UNITS tablet Take 2,000 Units by mouth daily.   07/04/2015 at Unknown time  . Emollient (EUCERIN)  lotion Apply 10 mLs topically as needed for dry skin.   Past Week at Unknown time  . fenofibrate (TRICOR) 145 MG tablet TAKE 1 TABLET BY MOUTH ONCE DAILY FOR CHOLESTEROL. 30 tablet 0 07/04/2015 at Unknown time  . fish oil-omega-3 fatty acids 1000 MG capsule Take 1 capsule (1 g total) by mouth 2 (two) times daily.   07/04/2015 at Unknown time  . furosemide (LASIX) 40 MG tablet Take 1 tablet (40 mg total) by mouth daily. RESTART ON SATURDAY, 09/03/14. (Patient taking differently: Take 80 mg by mouth daily. )   07/04/2015 at Unknown time  . glimepiride (AMARYL) 1 MG tablet Take 1 mg by mouth 2 (two) times daily.   07/04/2015 at Unknown time  . isosorbide mononitrate (IMDUR) 30 MG 24 hr tablet TAKE ONE TABLET BY MOUTH ONCE DAILY. 30 tablet 0 07/04/2015 at Unknown time  . metoprolol succinate (TOPROL-XL) 50 MG 24 hr tablet Take 50 mg by mouth daily.   07/04/2015 at 8-900a  . nitroGLYCERIN (NITROSTAT) 0.4 MG SL tablet Place 1 tablet (0.4 mg total) under the tongue every 5 (five) minutes as needed for chest pain. 25 tablet 12 unknown  . oxyCODONE-acetaminophen (PERCOCET) 10-325 MG per tablet Take 1 tablet by mouth every 4 (four) hours as  needed for pain. (Patient taking differently: Take 1 tablet by mouth every 3 (three) hours as needed for pain. ) 30 tablet 0 Past Week at Unknown time  . pantoprazole (PROTONIX) 40 MG tablet Take 1 tablet (40 mg total) by mouth 2 (two) times daily before a meal. 60 tablet 2 07/04/2015 at morning  . potassium chloride SA (K-DUR,KLOR-CON) 20 MEQ tablet Take 20 mEq by mouth daily.     07/04/2015 at Unknown time  . pregabalin (LYRICA) 50 MG capsule Take one capsule by mouth twice daily for pains (Patient taking differently: Take 50 mg by mouth 3 (three) times daily. ) 60 capsule 5 07/04/2015 at Unknown time  . ramipril (ALTACE) 2.5 MG capsule Take 1 capsule (2.5 mg total) by mouth 2 (two) times daily. (Patient taking differently: Take 2.5-5 mg by mouth 2 (two) times daily. Takes '5mg'$  capsule daily in the morning and takes 2.'5mg'$  capsule at bedtime) 60 capsule 3 07/04/2015 at Unknown time  . rosuvastatin (CRESTOR) 20 MG tablet TAKE (1) TABLET BY MOUTH AT BEDTIME FOR CHOLESTEROL. 30 tablet 0 07/03/2015 at Unknown time  . sitaGLIPtin (JANUVIA) 50 MG tablet Take 50 mg by mouth daily.   07/04/2015 at Unknown time  . vitamin C (ASCORBIC ACID) 500 MG tablet Take 500 mg by mouth daily.   07/04/2015 at Unknown time  . warfarin (COUMADIN) 5 MG tablet Take 1 tablet by mouth daily or as directed by coumadin clinic. (Patient taking differently: Take 2.5-5 mg by mouth. 2.'5mg'$  on Mondays and Fridays only. Takes '5mg'$  on all other day) 30 tablet 1 07/03/2015 at [place]  . ferrous sulfate 325 (65 FE) MG EC tablet Take 1 tablet (325 mg total) by mouth daily with breakfast. (Patient not taking: Reported on 06/08/2015)   Not Taking  . insulin detemir (LEVEMIR) 100 UNIT/ML injection Inject 0.1 mLs (10 Units total) into the skin at bedtime. (Patient not taking: Reported on 06/27/2015) 10 mL 11 Not Taking  . metoprolol succinate (TOPROL-XL) 25 MG 24 hr tablet Take 1 tablet (25 mg total) by mouth daily. (Patient not taking: Reported on  07/04/2015) 30 tablet 2  at 8-900a   Assessment: Okay for Protocol, elevated INR.  H/H trending down but stable.  Home dose '5mg'$  daily except 2.'5mg'$  on Mondays.  Recent Melena / Ulcer / Anemia  Goal of Therapy:  INR 2.5 - 3.5   Plan:  No Warfarin today due to elevated INR and low H/H Daily PT/INR Monitor CBC, s/sx of bleeding complications  Hart Robinsons A 07/07/2015,3:15 PM

## 2015-07-07 NOTE — Care Management Important Message (Signed)
Important Message  Patient Details  Name: Tanner Pena MRN: [id number] Date of Birth: Oct 29, 1940   Medicare Important Message Given:  Yes    Joylene Draft, RN 07/07/2015, 2:12 PM

## 2015-07-07 NOTE — Progress Notes (Signed)
[facility] PROGRESS NOTE  Tanner Pena ZOX:[id number] DOB: [birth year] DOA: 07/04/2015 PCP: [name], MD  Assessment/Plan: 1. Acute on chronic upper GI bleed with melena. Recent EGD on 11/1 revealed erosive gastritis. Hgb 7.2 today. Will transfuse 2U and repeat CBC in am. Continue IV PPI BID. GI consulted and recommends advancing diet to a carbohydrate modified low-residue diet. Given his recent NSTEMI, they do not recommend any invasive testing at this time unless he develops significant bleeding which requires emergent management.   2. Anemia due to chronic blood loss. Hgb 7.2, will transfuse and continue to monitor in the setting of GI bleed.  3. Chronic combined systolic and diastolic heart failure, appears compensated.  4. Elevated troponin with a hx of CAD.  EKG is reassuring. Patient reports chronic intermittent diffuse CP that is nonexertional. 5. AKI, possibly related to hypotension and volume depletion. Resolved with IVF. Will continue to hold ACE-I and monitor.  6. COPD, without exacerbation. Continue bronchodilators as needed.  7. Mechanical AVR, on Coumadin rx. Pharmacy following for adjustment in Coumadin given GI bleed. INR 4.51. Coumadin  on hold.  8. DM type 2, stable. Continue SSI and monitor.  9. Essential HTN, stable. Continue home meds and monitor.    Code Status: Full DVT prophylaxis: Warfarin Family Communication: Discussed with patient who understands and has no concerns at this time.  Disposition Plan: Move to medical floor today. Anticipate discharge in 1-2 days.     Consultants:  GI- Dr. [name].   Procedures:    Antibiotics:    HPI/Subjective: Feels good. Denies any lightheadedness, CP, dizziness, or SOB. States his last BM was 2 days ago.   Objective: Filed Vitals:   07/07/15 0452  BP: 134/39  Pulse: 61  Temp: 97.9 F (36.6 C)  Resp: 20    Intake/Output Summary (Last 24 hours) at 07/07/15 0735 Last data filed at 07/07/15  0006  Gross per 24 hour  Intake    840 ml  Output   1175 ml  Net   -335 ml   Filed Weights   07/04/15 2106 07/05/15 0500 07/06/15 0500  Weight: 91.6 kg (201 lb 15.1 oz) 91.6 kg (201 lb 15.1 oz) 90.4 kg (199 lb 4.7 oz)    Exam:  General: NAD, looks comfortable Cardiovascular: RRR, S1, S2, audible mechanical click.  Respiratory: clear bilaterally, No wheezing, rales or rhonchi Abdomen: soft, non tender, no distention , bowel sounds normal Musculoskeletal: No edema. Left BKA.  Data Reviewed: Basic Metabolic Panel:  Recent Labs Lab 07/04/15 1834 07/06/15 0250  NA 140 141  K 4.9 4.2  CL 106 109  CO2 26 24  GLUCOSE 136* 111*  BUN 61* 44*  CREATININE 1.64* 1.07  CALCIUM 9.3 8.9   Liver Function Tests:  Recent Labs Lab 07/04/15 1834  AST 19  ALT 13*  ALKPHOS 21*  BILITOT 0.5  PROT 5.9*  ALBUMIN 3.4*   CBC:  Recent Labs Lab 07/05/15 0402 07/05/15 0905 07/05/15 1541 07/05/15 2129 07/06/15 0250  WBC 9.5 7.6 8.0 7.1 6.5  HGB 8.5* 8.0* 8.3* 7.9* 7.8*  HCT 28.0* 26.3* 27.0* 26.3* 25.4*  MCV 84.6 84.6 83.3 85.4 84.4  PLT 234 206 214 204 185   Cardiac Enzymes:  Recent Labs Lab 07/04/15 2302 07/05/15 0402 07/05/15 1141  TROPONINI 0.06* 0.06* 0.06*   BNP (last 3 results)  Recent Labs  12/28/14 0134 02/04/15 0413  BNP 504.3* 593.2*    CBG:  Recent Labs Lab 07/06/15 1635 07/06/15 2006 07/07/15 0003  07/07/15 0452 07/07/15 0717  GLUCAP 129* 133* 159* 163* 148*       Scheduled Meds: . albuterol  4 mg Oral TID  . allopurinol  300 mg Oral Daily  . cholecalciferol  2,000 Units Oral Daily  . fenofibrate  160 mg Oral Daily  . insulin aspart  0-15 Units Subcutaneous 6 times per day  . isosorbide mononitrate  30 mg Oral Daily  . metoprolol succinate  50 mg Oral Daily  . omega-3 acid ethyl esters  1 g Oral BID  . pantoprazole (PROTONIX) IV  40 mg Intravenous Q12H  . potassium chloride SA  20 mEq Oral Daily  . pregabalin  50 mg Oral TID  .  rosuvastatin  20 mg Oral q1800  . sodium chloride  3 mL Intravenous Q12H  . sucralfate  1 g Oral TID WC & HS  . 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 MD [facility] Pager [phone]. If 7PM-7AM, please contact night-coverage at [email or web address], password [facility] 07/07/2015, 7:35 AM  LOS: 3 days     By signing my name below, I, Rosalie Doctor, attest that this documentation has been prepared under the direction and in the presence of [facility]. MD Electronically Signed: Rosalie Doctor, Scribe. 07/07/2015 12:35pm  I, Dr. Kathie Dike, personally performed the services described in this documentaiton. All medical record entries made by the scribe were at my direction and in my presence. I have reviewed the chart and agree that the record reflects my personal performance and is accurate and complete  Kathie Dike, MD, 07/07/2015 12:47 PM

## 2015-07-07 NOTE — Progress Notes (Signed)
    Subjective: States he's feeling good. No bowel movement yesterday. Last BM 2 days ago 'which was dark, but not as black as it had been." Denies abdominal pain, N/V. Wants to go home but want's to be able to stay out of the hospital. Denies chest pain, dyspnea, dizziness, lightheadedness.  Objective: Vital signs in last 24 hours: Temp:  [97.7 F (36.5 C)-98.2 F (36.8 C)] 97.9 F (36.6 C) (11/18 0452) Pulse Rate:  [58-70] 61 (11/18 0915) Resp:  [14-22] 20 (11/18 0452) BP: (117-149)/(39-61) 136/42 mmHg (11/18 0915) SpO2:  [97 %-100 %] 97 % (11/18 0452) Last BM Date: 07/05/15 General:   Alert and oriented, pleasant Head:  Normocephalic and atraumatic. Eyes:  No icterus, sclera clear. Conjuctiva pink.  Heart:  S1, S2 present, no murmurs noted.  Lungs: Minimal bilateral wheezes, withought rales or rhonchi.  Abdomen:  Bowel sounds present, rounded but soft, non-tender, non-distended. No HSM or hernias noted. No rebound or guarding. No masses appreciated  Msk:  Left BKA amputation noted. Neurologic:  Alert and  oriented x4;  grossly normal neurologically. Skin:  Warm and dry, intact without significant lesions.  Psych:  Alert and cooperative. Normal mood and affect.  Intake/Output from previous day: 11/17 0701 - 11/18 0700 In: 1080 [P.O.:1080] Out: 1545 [Urine:1545] Intake/Output this shift:    Lab Results:  Recent Labs  07/05/15 2129 07/06/15 0250 07/07/15 0715  WBC 7.1 6.5 4.4  HGB 7.9* 7.8* 7.2*  HCT 26.3* 25.4* 24.5*  PLT 204 185 184   BMET  Recent Labs  07/04/15 1834 07/06/15 0250  NA 140 141  K 4.9 4.2  CL 106 109  CO2 26 24  GLUCOSE 136* 111*  BUN 61* 44*  CREATININE 1.64* 1.07  CALCIUM 9.3 8.9   LFT  Recent Labs  07/04/15 1834  PROT 5.9*  ALBUMIN 3.4*  AST 19  ALT 13*  ALKPHOS 21*  BILITOT 0.5   PT/INR  Recent Labs  07/06/15 0250 07/07/15 0715  LABPROT 37.3* 41.6*  INR 3.91* 4.51*   Hepatitis Panel No results for input(s):  HEPBSAG, HCVAB, HEPAIGM, HEPBIGM in the last 72 hours.   Studies/Results: No results found.  Assessment: 74 y/o male with multiple medical problems including need for chronic anticoagulation in the setting of aortic prosthetic heart valve, PVD/CAD who presents with recurrent UGI bleeding. EGD couple of weeks ago with severe gastritis, bx negative for H.pylori. Previous colonoscopy and small bowel capsule 05/2015 without findings from small bowel or colon to explain GI bleeding. Presented this time on coumadin with INR 3.96, no longer on Plavix. Hgb has drifted downward.   Course complicated last admission with NSTEMI after EGD.  INR has actually increased some today (3.91 to 4.51). H/H with some continued drift from yesterday as well (7.8/25.4 to 7.2/24.5). Hemodynamically quite stable.   Plan: 1. Will proceed with tagged RBC study given continued drift in hgb without overt bleeding. 2. Transfuse as necessary 3. Avoiding procedure unless necessary due to post-procedure NSTEMI after last EGD 4. Continue supportive measures    LOS: 3 days    07/07/2015, 9:23 AM

## 2015-07-08 LAB — TYPE AND SCREEN
ABO/RH(D): A POS
Antibody Screen: NEGATIVE
UNIT DIVISION: 0
Unit division: 0

## 2015-07-08 LAB — GLUCOSE, CAPILLARY
GLUCOSE-CAPILLARY: 219 mg/dL — AB (ref 65–99)
GLUCOSE-CAPILLARY: 161 mg/dL — AB (ref 65–99)
GLUCOSE-CAPILLARY: 197 mg/dL — AB (ref 65–99)
Glucose-Capillary: 247 mg/dL — ABNORMAL HIGH (ref 65–99)
Glucose-Capillary: 253 mg/dL — ABNORMAL HIGH (ref 65–99)
Glucose-Capillary: 257 mg/dL — ABNORMAL HIGH (ref 65–99)

## 2015-07-08 LAB — CBC
HEMATOCRIT: 29.2 % — AB (ref 39.0–52.0)
HEMOGLOBIN: 9.3 g/dL — AB (ref 13.0–17.0)
MCH: 26.8 pg (ref 26.0–34.0)
MCHC: 31.8 g/dL (ref 30.0–36.0)
MCV: 84.1 fL (ref 78.0–100.0)
Platelets: 168 10*3/uL (ref 150–400)
RBC: 3.47 MIL/uL — AB (ref 4.22–5.81)
RDW: 18.7 % — ABNORMAL HIGH (ref 11.5–15.5)
WBC: 4.9 10*3/uL (ref 4.0–10.5)

## 2015-07-08 LAB — PROTIME-INR
INR: 2.59 — AB (ref 0.00–1.49)
Prothrombin Time: 27.4 seconds — ABNORMAL HIGH (ref 11.6–15.2)

## 2015-07-08 LAB — PREPARE RBC (CROSSMATCH)

## 2015-07-08 MED ORDER — PANTOPRAZOLE SODIUM 40 MG PO TBEC
40.0000 mg | DELAYED_RELEASE_TABLET | Freq: Two times a day (BID) | ORAL | Status: DC
Start: 2015-07-08 — End: 2015-07-09
  Administered 2015-07-08 – 2015-07-09 (×2): 40 mg via ORAL
  Filled 2015-07-08 (×2): qty 1

## 2015-07-08 MED ORDER — WARFARIN SODIUM 5 MG PO TABS
5.0000 mg | ORAL_TABLET | Freq: Once | ORAL | Status: AC
  Administered 2015-07-08: 5 mg via ORAL
  Filled 2015-07-08: qty 1

## 2015-07-08 MED ORDER — MAGNESIUM CITRATE PO SOLN
1.0000 | Freq: Once | ORAL | Status: AC
  Administered 2015-07-08: 1 via ORAL
  Filled 2015-07-08: qty 296

## 2015-07-08 NOTE — Progress Notes (Signed)
ANTICOAGULATION CONSULT NOTE  Pharmacy Consult for Warfarin Indication: Mechanical Valve  No Known Allergies  Patient Measurements: Height: '5\' 10"'$  (177.8 cm) Weight: 199 lb 4.7 oz (90.4 kg) IBW/kg (Calculated) : 73  Vital Signs: Temp: 97.1 F (36.2 C) (11/19 0420) Temp Source: Oral (11/19 0420) BP: 151/58 mmHg (11/19 0420) Pulse Rate: 60 (11/19 0420)  Labs:  Recent Labs  07/05/15 1141  07/06/15 0250 07/07/15 0715 07/08/15 0627  HGB  --   < > 7.8* 7.2* 9.3*  HCT  --   < > 25.4* 24.5* 29.2*  PLT  --   < > 185 184 168  LABPROT  --   --  37.3* 41.6* 27.4*  INR  --   --  3.91* 4.51* 2.59*  CREATININE  --   --  1.07 1.11  --   TROPONINI 0.06*  --   --   --   --   < > = values in this interval not displayed. Estimated Creatinine Clearance: 66.1 mL/min (by C-G formula based on Cr of 1.11).  Medical History: Past Medical History  Diagnosis Date  . Type 2 diabetes mellitus ([place]) 2007  . Essential hypertension   . Gout   . Hypercholesteremia   . Peripheral vascular disease ([place])     Lower extremity PCI/stenting  . COPD (chronic obstructive pulmonary disease) ([place])   . S/P aortic valve replacement 1990    a. St. Jude  . Chronic back pain   . Dysphagia   . Neuromuscular disorder ([place])   . Peripheral neuropathy ([place])   . Critical lower limb ischemia   . CAD (coronary artery disease)     a. 05/13/14 [name] s/p overlapping DESx2 to SVG to RCA. b.  s/p CABG '90 with redo '94 & stent to RCA SVG in 2005  . Chronic toe ulcer ([place])     a. Left foot  . Chronic systolic heart failure ([place])   . History of kidney stones   . GERD (gastroesophageal reflux disease)   . Arthritis   . Sleep apnea   . Myocardial infarction ([place])   . GI bleeding 05/31/2015    Source not identified.  . Acute blood loss anemia 02/2015 & 05/2015    Hemoglobin 5.5 on 05/31/15; status post transfusion.  . CHF (congestive heart failure) ([place])   . Asthma    Medications:  Prescriptions prior to admission   Medication Sig Dispense Refill Last Dose  . albuterol (PROVENTIL) 4 MG tablet Take 4 mg by mouth 3 (three) times daily.   07/04/2015 at Unknown time  . albuterol-ipratropium (COMBIVENT) 18-103 MCG/ACT inhaler Inhale 1 puff into the lungs 4 (four) times daily. Coughing/ Shortness of Breath   07/04/2015 at Unknown time  . allopurinol (ZYLOPRIM) 300 MG tablet Take 300 mg by mouth daily.   07/04/2015 at Unknown time  . cholecalciferol (VITAMIN D) 1000 UNITS tablet Take 2,000 Units by mouth daily.   07/04/2015 at Unknown time  . Emollient (EUCERIN) lotion Apply 10 mLs topically as needed for dry skin.   Past Week at Unknown time  . fenofibrate (TRICOR) 145 MG tablet TAKE 1 TABLET BY MOUTH ONCE DAILY FOR CHOLESTEROL. 30 tablet 0 07/04/2015 at Unknown time  . fish oil-omega-3 fatty acids 1000 MG capsule Take 1 capsule (1 g total) by mouth 2 (two) times daily.   07/04/2015 at Unknown time  . furosemide (LASIX) 40 MG tablet Take 1 tablet (40 mg total) by mouth daily. RESTART ON SATURDAY, 09/03/14. (Patient taking differently: Take  80 mg by mouth daily. )   07/04/2015 at Unknown time  . glimepiride (AMARYL) 1 MG tablet Take 1 mg by mouth 2 (two) times daily.   07/04/2015 at Unknown time  . isosorbide mononitrate (IMDUR) 30 MG 24 hr tablet TAKE ONE TABLET BY MOUTH ONCE DAILY. 30 tablet 0 07/04/2015 at Unknown time  . metoprolol succinate (TOPROL-XL) 50 MG 24 hr tablet Take 50 mg by mouth daily.   07/04/2015 at 8-900a  . nitroGLYCERIN (NITROSTAT) 0.4 MG SL tablet Place 1 tablet (0.4 mg total) under the tongue every 5 (five) minutes as needed for chest pain. 25 tablet 12 unknown  . oxyCODONE-acetaminophen (PERCOCET) 10-325 MG per tablet Take 1 tablet by mouth every 4 (four) hours as needed for pain. (Patient taking differently: Take 1 tablet by mouth every 3 (three) hours as needed for pain. ) 30 tablet 0 Past Week at Unknown time  . pantoprazole (PROTONIX) 40 MG tablet Take 1 tablet (40 mg total) by mouth 2 (two)  times daily before a meal. 60 tablet 2 07/04/2015 at morning  . potassium chloride SA (K-DUR,KLOR-CON) 20 MEQ tablet Take 20 mEq by mouth daily.     07/04/2015 at Unknown time  . pregabalin (LYRICA) 50 MG capsule Take one capsule by mouth twice daily for pains (Patient taking differently: Take 50 mg by mouth 3 (three) times daily. ) 60 capsule 5 07/04/2015 at Unknown time  . ramipril (ALTACE) 2.5 MG capsule Take 1 capsule (2.5 mg total) by mouth 2 (two) times daily. (Patient taking differently: Take 2.5-5 mg by mouth 2 (two) times daily. Takes '5mg'$  capsule daily in the morning and takes 2.'5mg'$  capsule at bedtime) 60 capsule 3 07/04/2015 at Unknown time  . rosuvastatin (CRESTOR) 20 MG tablet TAKE (1) TABLET BY MOUTH AT BEDTIME FOR CHOLESTEROL. 30 tablet 0 07/03/2015 at Unknown time  . sitaGLIPtin (JANUVIA) 50 MG tablet Take 50 mg by mouth daily.   07/04/2015 at Unknown time  . vitamin C (ASCORBIC ACID) 500 MG tablet Take 500 mg by mouth daily.   07/04/2015 at Unknown time  . warfarin (COUMADIN) 5 MG tablet Take 1 tablet by mouth daily or as directed by coumadin clinic. (Patient taking differently: Take 2.5-5 mg by mouth. 2.'5mg'$  on Mondays and Fridays only. Takes '5mg'$  on all other day) 30 tablet 1 07/03/2015 at [place]  . ferrous sulfate 325 (65 FE) MG EC tablet Take 1 tablet (325 mg total) by mouth daily with breakfast. (Patient not taking: Reported on 06/08/2015)   Not Taking  . insulin detemir (LEVEMIR) 100 UNIT/ML injection Inject 0.1 mLs (10 Units total) into the skin at bedtime. (Patient not taking: Reported on 06/27/2015) 10 mL 11 Not Taking  . metoprolol succinate (TOPROL-XL) 25 MG 24 hr tablet Take 1 tablet (25 mg total) by mouth daily. (Patient not taking: Reported on 07/04/2015) 30 tablet 2  at 8-900a   Assessment: Okay for Protocol, mechanical valve.  INR 2.59 today.  H/H improved and stable with blood transfusion. Home dose '5mg'$  daily except 2.'5mg'$  on Mondays.  Recent Melena / Ulcer / Anemia  Goal of  Therapy:  INR 2.5 - 3.5   Plan:  Restart Warfarin '5mg'$  today Daily PT/INR Monitor CBC, s/sx of bleeding complications  Isac Sarna, BS Vena [place], BCPS Clinical Pharmacist Pager [phone];  07/08/2015,9:54 AM

## 2015-07-08 NOTE — Progress Notes (Signed)
Discussed with Dr. Roderic Palau. Patient tolerating diet. No stool today. Hemoglobin stable. INR of 2.59 Tagged RBC study negative  Vital signs in last 24 hours: Temp:  [97.1 F (36.2 C)-99.2 F (37.3 C)] 97.1 F (36.2 C) (11/19 0420) Pulse Rate:  [60-71] 60 (11/19 0420) Resp:  [18] 18 (11/19 0420) BP: (113-153)/(41-58) 151/58 mmHg (11/19 0420) SpO2:  [97 %-100 %] 97 % (11/19 0420) Last BM Date: 07/07/15  General:   Alert. pleasant and cooperative in NAD Abdomen:  Soft, nontender and nondistended.  Normal bowel sounds, without guarding, and without rebound.  No mass or organomegaly. Extremities:  Without clubbing or edema.    Intake/Output from previous day: 11/18 0701 - 11/19 0700 In: 1100 [P.O.:240; I.V.:500; Blood:360] Out: 1100 [Urine:1100] Intake/Output this shift:    Lab Results:  Recent Labs  07/06/15 0250 07/07/15 0715 07/08/15 0627  WBC 6.5 4.4 4.9  HGB 7.8* 7.2* 9.3*  HCT 25.4* 24.5* 29.2*  PLT 185 184 168   BMET  Recent Labs  07/06/15 0250 07/07/15 0715  NA 141 138  K 4.2 4.2  CL 109 110  CO2 24 26  GLUCOSE 111* 155*  BUN 44* 23*  CREATININE 1.07 1.11  CALCIUM 8.9 9.0   LFT No results for input(s): PROT, ALBUMIN, AST, ALT, ALKPHOS, BILITOT, BILIDIR, IBILI in the last 72 hours. PT/INR  Recent Labs  07/07/15 0715 07/08/15 0627  LABPROT 41.6* 27.4*  INR 4.51* 2.59*   Hepatitis Panel No results for input(s): HEPBSAG, HCVAB, HEPAIGM, HEPBIGM in the last 72 hours. C-Diff No results for input(s): CDIFFTOX in the last 72 hours.  Studies/Results: Nm Gi Blood Loss  07/07/2015  CLINICAL DATA:  Intermittent bloody stool black or very dark in color, anemia, question GI bleeding EXAM: NUCLEAR MEDICINE GASTROINTESTINAL BLEEDING SCAN TECHNIQUE: Sequential abdominal images were obtained following intravenous administration of Tc-25mlabeled red blood cells. RADIOPHARMACEUTICALS:  27 mCi [id number]-vitro labeled autologous red cells IV. COMPARISON:  None  FINDINGS: Imaging was performed for 2 hours. Normal blood pool distribution of tracer. Urinary tract excretion of de labeled tracer. No abnormal gastrointestinal localization of tracer identified to suggest site of active GI bleeding. IMPRESSION: Negative GI bleeding scan. Electronically Signed   By: MaLavonia Dana.D.   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 Patient tolerating diet. INR down to 2.59  Recommendations:  Continue PPI. As Coumadin will be continued, would strive for tight control within therapeutic window. We'll plan to see back in the office as needed.

## 2015-07-08 NOTE — Progress Notes (Signed)
[facility] PROGRESS NOTE  Tanner Pena ZOX:[id number] DOB: 07-03-1941 DOA: 07/04/2015 PCP: [name], MD  Assessment/Plan: 1. Acute on chronic upper GI bleed with melena. Recent EGD on 11/1 revealed erosive gastritis. Hgb 9.3 today s/p transfusion. Continue PPI BID and carbohydrate modified low-residue diet.   GI input appreciated. Nuclear med scan is unremarkable. 2. Anemia due to chronic blood loss. Hgb 9.3 s/p transfusion of 2U PRBCs on 11/18. Will continue to monitor. 3. Chronic combined systolic and diastolic heart failure, appears compensated.  4. Elevated troponin with a hx of CAD.  EKG is reassuring. Patient reports chronic intermittent diffuse CP that is nonexertional. 5. AKI, possibly related to hypotension and volume depletion. Resolved with IVF. Will continue to hold ACE-I and monitor.  6. COPD, without exacerbation. Continue bronchodilators as needed.  7. Mechanical AVR, on Coumadin rx. INR 2.59. Pharmacy to dose Coumadin. Patient encouraged to follow INR closely upon discharge.  8. DM type 2, stable. Continue SSI and monitor.  9. Essential HTN, stable. Continue home meds and monitor.  10. Constipation, last BM 3 days ago. Will provide stool softener.   Code Status: Full DVT prophylaxis: Warfarin Family Communication: Discussed with patient who understands and has no concerns at this time.  Disposition Plan: Anticipate discharge tomorrow if continues to improve.   Consultants:  GI- Dr. [name].   Procedures:  Transfused 2U PRBCs on 11/18  Antibiotics:    HPI/Subjective: Feels good. Denies any pain, SOB, or dizziness. Has not had a BM in 3 days.   Objective: Filed Vitals:   07/08/15 0420  BP: 151/58  Pulse: 60  Temp: 97.1 F (36.2 C)  Resp: 18    Intake/Output Summary (Last 24 hours) at 07/08/15 0739 Last data filed at 07/08/15 0648  Gross per 24 hour  Intake   1100 ml  Output   1100 ml  Net      0 ml   Filed Weights   07/04/15  2106 07/05/15 0500 07/06/15 0500  Weight: 91.6 kg (201 lb 15.1 oz) 91.6 kg (201 lb 15.1 oz) 90.4 kg (199 lb 4.7 oz)    Exam:  General: NAD, looks comfortable Cardiovascular: RRR, S1, S2, audible mechanical click.  Respiratory: clear bilaterally, No wheezing, rales or rhonchi Abdomen: soft, non tender, no distention , bowel sounds normal Musculoskeletal: No edema. Left BKA.  Data Reviewed: Basic Metabolic Panel:  Recent Labs Lab 07/04/15 1834 07/06/15 0250 07/07/15 0715  NA 140 141 138  K 4.9 4.2 4.2  CL 106 109 110  CO2 '26 24 26  '$ GLUCOSE 136* 111* 155*  BUN 61* 44* 23*  CREATININE 1.64* 1.07 1.11  CALCIUM 9.3 8.9 9.0   Liver Function Tests:  Recent Labs Lab 07/04/15 1834  AST 19  ALT 13*  ALKPHOS 21*  BILITOT 0.5  PROT 5.9*  ALBUMIN 3.4*   CBC:  Recent Labs Lab 07/05/15 1541 07/05/15 2129 07/06/15 0250 07/07/15 0715 07/08/15 0627  WBC 8.0 7.1 6.5 4.4 4.9  HGB 8.3* 7.9* 7.8* 7.2* 9.3*  HCT 27.0* 26.3* 25.4* 24.5* 29.2*  MCV 83.3 85.4 84.4 86.9 84.1  PLT 214 204 185 184 168   Cardiac Enzymes:  Recent Labs Lab 07/04/15 2302 07/05/15 0402 07/05/15 1141  TROPONINI 0.06* 0.06* 0.06*   BNP (last 3 results)  Recent Labs  12/28/14 0134 02/04/15 0413  BNP 504.3* 593.2*    CBG:  Recent Labs Lab 07/07/15 1107 07/07/15 1645 07/07/15 2005 07/08/15 0023 07/08/15 0419  GLUCAP 192* 207* 173* 257* 219*  Scheduled Meds: . albuterol  4 mg Oral TID  . allopurinol  300 mg Oral Daily  . cholecalciferol  2,000 Units Oral Daily  . fenofibrate  160 mg Oral Daily  . insulin aspart  0-15 Units Subcutaneous 6 times per day  . isosorbide mononitrate  30 mg Oral Daily  . metoprolol succinate  50 mg Oral Daily  . omega-3 acid ethyl esters  1 g Oral BID  . pantoprazole (PROTONIX) IV  40 mg Intravenous Q12H  . potassium chloride SA  20 mEq Oral Daily  . pregabalin  50 mg Oral TID  . rosuvastatin  20 mg Oral q1800  . sodium chloride  3 mL  Intravenous Q12H  . sucralfate  1 g Oral TID WC & HS  . 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 MD [facility] Pager [phone]. If 7PM-7AM, please contact night-coverage at [email or web address], password [facility] 07/08/2015, 7:39 AM  LOS: 4 days     By signing my name below, I, Rosalie Doctor, attest that this documentation has been prepared under the direction and in the presence of [facility]. MD Electronically Signed: Rosalie Doctor, Scribe. 07/08/2015 12:41pm  I, Dr. Kathie Dike, personally performed the services described in this documentaiton. All medical record entries made by the scribe were at my direction and in my presence. I have reviewed the chart and agree that the record reflects my personal performance and is accurate and complete  Kathie Dike, MD, 07/08/2015 3:33 PM

## 2015-07-09 LAB — CBC
HCT: 28.9 % — ABNORMAL LOW (ref 39.0–52.0)
HEMOGLOBIN: 9.2 g/dL — AB (ref 13.0–17.0)
MCH: 27.1 pg (ref 26.0–34.0)
MCHC: 31.8 g/dL (ref 30.0–36.0)
MCV: 85 fL (ref 78.0–100.0)
Platelets: 173 10*3/uL (ref 150–400)
RBC: 3.4 MIL/uL — ABNORMAL LOW (ref 4.22–5.81)
RDW: 19.3 % — ABNORMAL HIGH (ref 11.5–15.5)
WBC: 5.4 10*3/uL (ref 4.0–10.5)

## 2015-07-09 LAB — GLUCOSE, CAPILLARY
GLUCOSE-CAPILLARY: 196 mg/dL — AB (ref 65–99)
GLUCOSE-CAPILLARY: 218 mg/dL — AB (ref 65–99)
Glucose-Capillary: 199 mg/dL — ABNORMAL HIGH (ref 65–99)

## 2015-07-09 LAB — PROTIME-INR
INR: 2.07 — AB (ref 0.00–1.49)
PROTHROMBIN TIME: 23.1 s — AB (ref 11.6–15.2)

## 2015-07-09 MED ORDER — SUCRALFATE 1 GM/10ML PO SUSP: 1.0000 g | Freq: Three times a day (TID) | ORAL | Status: DC

## 2015-07-09 MED ORDER — WARFARIN SODIUM 5 MG PO TABS: 5.0000 mg | ORAL_TABLET | Freq: Once | ORAL | Status: DC

## 2015-07-09 NOTE — Discharge Summary (Signed)
Physician Discharge Summary  Tanner Pena EVO:[id number] DOB: [birth year] DOA: 07/04/2015  PCP: [name], MD  Admit date: 07/04/2015 Discharge date: 07/09/2015  Time spent: 35 minutes   Recommendations for Outpatient Follow-up:  1. Follow up with GI, as outpatient as needed.  2. Follow up with PCP in 1 week to recheck hemoglobin.   3. Follow up with Coumadin clinic 11/24.  Discharge Diagnoses:  Principal Problem:   GI bleed Active Problems:   Long term current use of anticoagulant therapy   H/O aortic valve replacement- St Jude   CAD - CABG '90 with re do '94    DM2 (diabetes mellitus, type 2) (HCC)   Acute on chronic combined systolic and diastolic CHF   Cardiomyopathy, ischemic - EF 25-30% by echo 5/14   COPD (chronic obstructive pulmonary disease) (HCC)   Below knee amputation status (HCC)   COPD with exacerbation (HCC)   Acute blood loss anemia   History of coronary artery stent placement   Anticoagulation management encounter   Melena   Gastritis and gastroduodenitis   Discharge Condition: Improved   Diet recommendation: Heart healthy, carb modified, low-residue diet   Filed Weights   07/04/15 2106 07/05/15 0500 07/06/15 0500  Weight: 91.6 kg (201 lb 15.1 oz) 91.6 kg (201 lb 15.1 oz) 90.4 kg (199 lb 4.7 oz)    History of present illness:  Patient is a 43 yom with PMH of CAD s/p PCI with multiple stents and previoulsy on Plavix, and 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 He presented with complaints of melanotic stool. While in the ED he was found to be hemodynamically stable with INR 3.96. H e was given IVF and admitted for upper GI bleed.   Hospital Course:  Tanner Pena was admitted for upper GI bleeding, he has a hx of gastritis which is complicated by concomitant anticoagulation. INR was noted to be  supratherapuetic on admission. He was seen by GI who did not recommend repeat EGD at this time. Patient was treated supportively with PPI and bowel rest. His GI bleeding has now resolved, evidence by stable Hgb and normal bowel movements. Will follow up with GI as needed.   Anemia due to chronic blood loss improved s/p transfusion of 2 units PRBC. Upon discharge Hgb noted to be 9.2 and stable.   1. Chronic combined systolic and diastolic heart failure, appears compensated.  2. Elevated troponin with a hx of CAD. EKG was reassuring. Patient reported chronic intermittent diffuse CP that was nonexertional. 3. AKI, possibly related to hypotension and volume depletion. Resolved with IVF. Resume ACE-I upon discharge. 4. COPD, without exacerbation. Continue bronchodilators as needed.  5. Mechanical AVR, on Coumadin rx. INR 2.07. Encouraged to follow INR closely upon discharge.  6. DM type 2, stable. Continue outpatient regimen.  7. Essential HTN, stable. Continue home medications.   8. Constipation, last BM 3 days ago. Resolved with Maxcitrate.   Procedures:  Transfused 2 units PRBCs on 11/18  Consultations:  GI-Dr Rourk   Discharge Exam: Filed Vitals:   07/09/15 0428  BP: 165/54  Pulse: 66  Temp: 97.9 F (36.6 C)  Resp: 16    General: NAD. Sitting up in bed and looks comfortable. Cardiovascular: RRR, S1, S2  Respiratory: clear bilaterally, No wheezing, rales or rhonchi. Normal respiratory efforts.  Abdomen: soft, non tender, no distention ,  positive bowel sounds.  Musculoskeletal: No edema b/l. Left BKA.   Discharge Instructions   Discharge Instructions    Diet - low sodium heart healthy    Complete by:  As directed      Increase activity slowly    Complete by:  As directed           Current Discharge Medication List    START taking these medications   Details  sucralfate (CARAFATE) 1 GM/10ML suspension Take 10 mLs (1 g total) by mouth 4 (four) times daily -  with  meals and at bedtime. Qty: 420 mL, Refills: 0      CONTINUE these medications which have NOT CHANGED   Details  albuterol (PROVENTIL) 4 MG tablet Take 4 mg by mouth 3 (three) times daily.    albuterol-ipratropium (COMBIVENT) 18-103 MCG/ACT inhaler Inhale 1 puff into the lungs 4 (four) times daily. Coughing/ Shortness of Breath    allopurinol (ZYLOPRIM) 300 MG tablet Take 300 mg by mouth daily.    cholecalciferol (VITAMIN D) 1000 UNITS tablet Take 2,000 Units by mouth daily.    Emollient (EUCERIN) lotion Apply 10 mLs topically as needed for dry skin.    fenofibrate (TRICOR) 145 MG tablet TAKE 1 TABLET BY MOUTH ONCE DAILY FOR CHOLESTEROL. Qty: 30 tablet, Refills: 0    fish oil-omega-3 fatty acids 1000 MG capsule Take 1 capsule (1 g total) by mouth 2 (two) times daily.    furosemide (LASIX) 40 MG tablet Take 1 tablet (40 mg total) by mouth daily. RESTART ON SATURDAY, 09/03/14.    glimepiride (AMARYL) 1 MG tablet Take 1 mg by mouth 2 (two) times daily.    isosorbide mononitrate (IMDUR) 30 MG 24 hr tablet TAKE ONE TABLET BY MOUTH ONCE DAILY. Qty: 30 tablet, Refills: 0    metoprolol succinate (TOPROL-XL) 50 MG 24 hr tablet Take 50 mg by mouth daily.    nitroGLYCERIN (NITROSTAT) 0.4 MG SL tablet Place 1 tablet (0.4 mg total) under the tongue every 5 (five) minutes as needed for chest pain. Qty: 25 tablet, Refills: 12    oxyCODONE-acetaminophen (PERCOCET) 10-325 MG per tablet Take 1 tablet by mouth every 4 (four) hours as needed for pain. Qty: 30 tablet, Refills: 0    pantoprazole (PROTONIX) 40 MG tablet Take 1 tablet (40 mg total) by mouth 2 (two) times daily before a meal. Qty: 60 tablet, Refills: 2    potassium chloride SA (K-DUR,KLOR-CON) 20 MEQ tablet Take 20 mEq by mouth daily.      pregabalin (LYRICA) 50 MG capsule Take one capsule by mouth twice daily for pains Qty: 60 capsule, Refills: 5    ramipril (ALTACE) 2.5 MG capsule Take 1 capsule (2.5 mg total) by mouth 2 (two)  times daily. Qty: 60 capsule, Refills: 3    rosuvastatin (CRESTOR) 20 MG tablet TAKE (1) TABLET BY MOUTH AT BEDTIME FOR CHOLESTEROL. Qty: 30 tablet, Refills: 0    sitaGLIPtin (JANUVIA) 50 MG tablet Take 50 mg by mouth daily.    vitamin C (ASCORBIC ACID) 500 MG tablet Take 500 mg by mouth daily.    warfarin (COUMADIN) 5 MG tablet Take 1 tablet by mouth daily or as directed by coumadin clinic. Qty: 30 tablet, Refills: 1    ferrous sulfate 325 (65 FE) MG EC tablet Take 1 tablet (325 mg total) by mouth daily with breakfast.      STOP taking these medications     insulin detemir (LEVEMIR) 100 UNIT/ML injection  No Known Allergies    The results of significant diagnostics from this hospitalization (including imaging, microbiology, ancillary and laboratory) are listed below for reference.    Significant Diagnostic Studies: Nm Gi Blood Loss  07/07/2015  CLINICAL DATA:  Intermittent bloody stool black or very dark in color, anemia, question GI bleeding EXAM: NUCLEAR MEDICINE GASTROINTESTINAL BLEEDING SCAN TECHNIQUE: Sequential abdominal images were obtained following intravenous administration of Tc-22mlabeled red blood cells. RADIOPHARMACEUTICALS:  27 mCi Tc-969mn-vitro labeled autologous red cells IV. COMPARISON:  None FINDINGS: Imaging was performed for 2 hours. Normal blood pool distribution of tracer. Urinary tract excretion of de labeled tracer. No abnormal gastrointestinal localization of tracer identified to suggest site of active GI bleeding. IMPRESSION: Negative GI bleeding scan. Electronically Signed   By: MaLavonia Dana.D.   On: 07/07/2015 16:27   Dg Chest Port 1 View  06/15/2015  CLINICAL DATA:  Abdominal pain.  Admitted for recurrent GI bleed. EXAM: PORTABLE CHEST 1 VIEW COMPARISON:  Radiograph 05/30/2015 FINDINGS: Sternotomy wires overlie stable enlarged cardiac silhouette. Chronic elevation LEFT hemidiaphragm. No effusion infiltrate pneumothorax. IMPRESSION: No acute  cardiopulmonary findings. Cardiomegaly and elevation of LEFT hemidiaphragm Electronically Signed   By: StSuzy Bouchard.D.   On: 06/15/2015 16:05   Dg Abd Portable 1v  06/15/2015  CLINICAL DATA:  Abdominal pain.  Recurrent GI bleeding. EXAM: PORTABLE ABDOMEN - 1 VIEW COMPARISON:  None. FINDINGS: The bowel gas pattern is normal. Extensive aortic and visceral arterial vascular calcification noted. IMPRESSION: No acute findings. Electronically Signed   By: JoEarle Gell.D.   On: 06/15/2015 16:10    Microbiology: No results found for this or any previous visit (from the past 240 hour(s)).   Labs: Basic Metabolic Panel:  Recent Labs Lab 07/04/15 1834 07/06/15 0250 07/07/15 0715  NA 140 141 138  K 4.9 4.2 4.2  CL 106 109 110  CO2 '26 24 26  '$ GLUCOSE 136* 111* 155*  BUN 61* 44* 23*  CREATININE 1.64* 1.07 1.11  CALCIUM 9.3 8.9 9.0   Liver Function Tests:  Recent Labs Lab 07/04/15 1834  AST 19  ALT 13*  ALKPHOS 21*  BILITOT 0.5  PROT 5.9*  ALBUMIN 3.4*   CBC:  Recent Labs Lab 07/05/15 2129 07/06/15 0250 07/07/15 0715 07/08/15 0627 07/09/15 0615  WBC 7.1 6.5 4.4 4.9 5.4  HGB 7.9* 7.8* 7.2* 9.3* 9.2*  HCT 26.3* 25.4* 24.5* 29.2* 28.9*  MCV 85.4 84.4 86.9 84.1 85.0  PLT 204 185 184 168 173   Cardiac Enzymes:  Recent Labs Lab 07/04/15 2302 07/05/15 0402 07/05/15 1141  TROPONINI 0.06* 0.06* 0.06*   BNP: BNP (last 3 results)  Recent Labs  12/28/14 0134 02/04/15 0413  BNP 504.3* 593.2*   CBG:  Recent Labs Lab 07/08/15 1659 07/08/15 2033 07/09/15 0015 07/09/15 0427 07/09/15 0804  GLUCAP 197* 253* 218* 196* 199*    Signed:  MeKathie DikeMD  [facility] 07/09/2015, 10:40 AM   By signing my name below, I, JeRennis Hardingattest that this documentation has been prepared under the direction and in the presence of JeKathie DikeMD. Electronically signed: JeRennis HardingScribe. 07/09/2015 10:25am.  I, Dr. JeKathie Dike personally performed the services described in this documentaiton. All medical record entries made by the scribe were at my direction and in my presence. I have reviewed the chart and agree that the record reflects my personal performance and is accurate and complete  JeKathie DikeMD, 07/09/2015 10:40 AM

## 2015-07-09 NOTE — Progress Notes (Signed)
ANTICOAGULATION CONSULT NOTE  Pharmacy Consult for Warfarin Indication: Mechanical Valve  No Known Allergies  Patient Measurements: Height: '5\' 10"'$  (177.8 cm) Weight: 199 lb 4.7 oz (90.4 kg) IBW/kg (Calculated) : 73  Vital Signs: Temp: 97.9 F (36.6 C) (11/20 0428) Temp Source: Oral (11/20 0428) BP: 165/54 mmHg (11/20 0428) Pulse Rate: 66 (11/20 0428)  Labs:  Recent Labs  07/07/15 0715 07/08/15 0627 07/09/15 0615  HGB 7.2* 9.3* 9.2*  HCT 24.5* 29.2* 28.9*  PLT 184 168 173  LABPROT 41.6* 27.4* 23.1*  INR 4.51* 2.59* 2.07*  CREATININE 1.11  --   --    Estimated Creatinine Clearance: 66.1 mL/min (by C-G formula based on Cr of 1.11).  Medical History: Past Medical History  Diagnosis Date  . Type 2 diabetes mellitus ([place]) 2007  . Essential hypertension   . Gout   . Hypercholesteremia   . Peripheral vascular disease ([place])     Lower extremity PCI/stenting  . COPD (chronic obstructive pulmonary disease) ([place])   . S/P aortic valve replacement 1990    a. St. Jude  . Chronic back pain   . Dysphagia   . Neuromuscular disorder ([place])   . Peripheral neuropathy ([place])   . Critical lower limb ischemia   . CAD (coronary artery disease)     a. 05/13/14 [name] s/p overlapping DESx2 to SVG to RCA. b.  s/p CABG '90 with redo '94 & stent to RCA SVG in 2005  . 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 Left foot  . Chronic systolic heart failure ([place])   . History of kidney stones   . GERD (gastroesophageal reflux disease)   . Arthritis   . Sleep apnea   . Myocardial infarction ([place])   . GI bleeding 05/31/2015    Source not identified.  . Acute blood loss anemia 02/2015 & 05/2015    Hemoglobin 5.5 on 05/31/15; status post transfusion.  . CHF (congestive heart failure) ([place])   . Asthma    Medications:  Prescriptions prior to admission  Medication Sig Dispense Refill Last Dose  . albuterol (PROVENTIL) 4 MG tablet Take 4 mg by mouth 3 (three) times daily.   07/04/2015 at Unknown time  .  albuterol-ipratropium (COMBIVENT) 18-103 MCG/ACT inhaler Inhale 1 puff into the lungs 4 (four) times daily. Coughing/ Shortness of Breath   07/04/2015 at Unknown time  . allopurinol (ZYLOPRIM) 300 MG tablet Take 300 mg by mouth daily.   07/04/2015 at Unknown time  . cholecalciferol (VITAMIN D) 1000 UNITS tablet Take 2,000 Units by mouth daily.   07/04/2015 at Unknown time  . Emollient (EUCERIN) lotion Apply 10 mLs topically as needed for dry skin.   Past Week at Unknown time  . fenofibrate (TRICOR) 145 MG tablet TAKE 1 TABLET BY MOUTH ONCE DAILY FOR CHOLESTEROL. 30 tablet 0 07/04/2015 at Unknown time  . fish oil-omega-3 fatty acids 1000 MG capsule Take 1 capsule (1 g total) by mouth 2 (two) times daily.   07/04/2015 at Unknown time  . furosemide (LASIX) 40 MG tablet Take 1 tablet (40 mg total) by mouth daily. RESTART ON SATURDAY, 09/03/14. (Patient taking differently: Take 80 mg by mouth daily. )   07/04/2015 at Unknown time  . glimepiride (AMARYL) 1 MG tablet Take 1 mg by mouth 2 (two) times daily.   07/04/2015 at Unknown time  . isosorbide mononitrate (IMDUR) 30 MG 24 hr tablet TAKE ONE TABLET BY MOUTH ONCE DAILY. 30 tablet 0 07/04/2015 at Unknown time  .  metoprolol succinate (TOPROL-XL) 50 MG 24 hr tablet Take 50 mg by mouth daily.   07/04/2015 at 8-900a  . nitroGLYCERIN (NITROSTAT) 0.4 MG SL tablet Place 1 tablet (0.4 mg total) under the tongue every 5 (five) minutes as needed for chest pain. 25 tablet 12 unknown  . oxyCODONE-acetaminophen (PERCOCET) 10-325 MG per tablet Take 1 tablet by mouth every 4 (four) hours as needed for pain. (Patient taking differently: Take 1 tablet by mouth every 3 (three) hours as needed for pain. ) 30 tablet 0 Past Week at Unknown time  . pantoprazole (PROTONIX) 40 MG tablet Take 1 tablet (40 mg total) by mouth 2 (two) times daily before a meal. 60 tablet 2 07/04/2015 at morning  . potassium chloride SA (K-DUR,KLOR-CON) 20 MEQ tablet Take 20 mEq by mouth daily.      07/04/2015 at Unknown time  . pregabalin (LYRICA) 50 MG capsule Take one capsule by mouth twice daily for pains (Patient taking differently: Take 50 mg by mouth 3 (three) times daily. ) 60 capsule 5 07/04/2015 at Unknown time  . ramipril (ALTACE) 2.5 MG capsule Take 1 capsule (2.5 mg total) by mouth 2 (two) times daily. (Patient taking differently: Take 2.5-5 mg by mouth 2 (two) times daily. Takes '5mg'$  capsule daily in the morning and takes 2.'5mg'$  capsule at bedtime) 60 capsule 3 07/04/2015 at Unknown time  . rosuvastatin (CRESTOR) 20 MG tablet TAKE (1) TABLET BY MOUTH AT BEDTIME FOR CHOLESTEROL. 30 tablet 0 07/03/2015 at Unknown time  . sitaGLIPtin (JANUVIA) 50 MG tablet Take 50 mg by mouth daily.   07/04/2015 at Unknown time  . vitamin C (ASCORBIC ACID) 500 MG tablet Take 500 mg by mouth daily.   07/04/2015 at Unknown time  . warfarin (COUMADIN) 5 MG tablet Take 1 tablet by mouth daily or as directed by coumadin clinic. (Patient taking differently: Take 2.5-5 mg by mouth. 2.'5mg'$  on Mondays and Fridays only. Takes '5mg'$  on all other day) 30 tablet 1 07/03/2015 at [place]  . ferrous sulfate 325 (65 FE) MG EC tablet Take 1 tablet (325 mg total) by mouth daily with breakfast. (Patient not taking: Reported on 06/08/2015)   Not Taking  . insulin detemir (LEVEMIR) 100 UNIT/ML injection Inject 0.1 mLs (10 Units total) into the skin at bedtime. (Patient not taking: Reported on 06/27/2015) 10 mL 11 Not Taking  . metoprolol succinate (TOPROL-XL) 25 MG 24 hr tablet Take 1 tablet (25 mg total) by mouth daily. (Patient not taking: Reported on 07/04/2015) 30 tablet 2  at 8-900a   Assessment: Okay for Protocol, mechanical valve.  INR 2.07 today.  H/H improved and stable with blood transfusion. Home dose '5mg'$  daily except 2.'5mg'$  on Mondays.  Recent Melena / Ulcer / Anemia. Tag RBC study negative. Still plan to be cautious with Coumadin.   Goal of Therapy:  INR 2.5 - 3.5   Plan:  Warfarin '5mg'$  today Daily PT/INR Monitor  CBC, s/sx of bleeding complications  Isac Sarna, BS Vena [place], BCPS Clinical Pharmacist Pager [phone];  07/09/2015,8:35 AM

## 2015-07-09 NOTE — Progress Notes (Signed)
Patient d/c home with family no c/o pain at d/c left floor via wheel chair accompanied by staff. D/c instructions, RX's and when to follow up with MD discussed with patient verbalized understanding  Tanner Pena, Tivis Ringer

## 2015-07-11 ENCOUNTER — Other Ambulatory Visit: Payer: Self-pay | Admitting: *Deleted

## 2015-07-11 NOTE — Patient Outreach (Signed)
Call to patient for transition of care week #1 Patient discharged on 07/09/15, admitted for GI bleed. Patient reporting he is better and hopes they have gotten the bleeding under control, states that his stomach was "irritated" and that was reason for bleed. He does report a "new stomach medication" He has already picked up from pharmacy and is taking. Daughter continues to fill med box. Patient has appointment to follow up with primary MD on 11/28 No new issues or concerns this call.   Plan outreach visit for next week for transition of care week #2 Stanton Kidney E. Laymond Purser, RN, BSN, [place] [phone]

## 2015-07-17 ENCOUNTER — Ambulatory Visit: Payer: Medicare Other | Admitting: Gastroenterology

## 2015-07-17 DIAGNOSIS — Z6830 Body mass index (BMI) 30.0-30.9, adult: Secondary | ICD-10-CM | POA: Diagnosis not present

## 2015-07-17 DIAGNOSIS — Z7901 Long term (current) use of anticoagulants: Secondary | ICD-10-CM | POA: Diagnosis not present

## 2015-07-17 DIAGNOSIS — Z1389 Encounter for screening for other disorder: Secondary | ICD-10-CM | POA: Diagnosis not present

## 2015-07-17 DIAGNOSIS — D649 Anemia, unspecified: Secondary | ICD-10-CM | POA: Diagnosis not present

## 2015-07-17 DIAGNOSIS — I213 ST elevation (STEMI) myocardial infarction of unspecified site: Secondary | ICD-10-CM | POA: Diagnosis not present

## 2015-07-17 DIAGNOSIS — K922 Gastrointestinal hemorrhage, unspecified: Secondary | ICD-10-CM | POA: Diagnosis not present

## 2015-07-18 DIAGNOSIS — E1129 Type 2 diabetes mellitus with other diabetic kidney complication: Secondary | ICD-10-CM | POA: Diagnosis not present

## 2015-07-18 DIAGNOSIS — E119 Type 2 diabetes mellitus without complications: Secondary | ICD-10-CM | POA: Diagnosis not present

## 2015-07-18 DIAGNOSIS — H538 Other visual disturbances: Secondary | ICD-10-CM | POA: Diagnosis not present

## 2015-07-19 ENCOUNTER — Other Ambulatory Visit: Payer: Self-pay | Admitting: Cardiovascular Disease

## 2015-07-19 ENCOUNTER — Other Ambulatory Visit: Payer: Self-pay | Admitting: Physician Assistant

## 2015-07-19 ENCOUNTER — Other Ambulatory Visit: Payer: Self-pay | Admitting: *Deleted

## 2015-07-19 ENCOUNTER — Encounter: Payer: Self-pay | Admitting: *Deleted

## 2015-07-19 NOTE — Patient Outreach (Signed)
[facility]) Care Management   07/19/2015  [place] 07/03/1941 [id number]  Tanner Pena is an 74 y.o. male  Subjective:  Patient states he had an episode of chest pain last night, resolved with Rolaids. Patient reports he saw primary care MD, no changes. Patient states he is still not weighing daily  Patient plans to return to rehab for increased endurance and strengthening.  Objective:   BP 140/72 mmHg  Pulse 61  Resp 18  Wt 209 lb (94.802 kg)  SpO2 97% Review of Systems  Constitutional: Negative.   HENT: Negative.   Respiratory: Positive for cough.        States a chronic cough with clear to whitish sputum  Gastrointestinal: Positive for heartburn.  Genitourinary: Negative.   Musculoskeletal: Negative.   Skin: Negative.   Neurological: Negative.   Psychiatric/Behavioral: Negative.     Physical Exam  Constitutional: He is oriented to person, place, and time. He appears well-nourished.  HENT:  Head: Normocephalic.  Neck: Normal range of motion.  Cardiovascular: Normal rate and regular rhythm.   Valve click noted  Respiratory: Effort normal and breath sounds normal.  GI: Soft. Bowel sounds are normal.  Musculoskeletal: Normal range of motion.  Neurological: He is alert and oriented to person, place, and time.  Skin: Skin is warm and dry.    Current Medications:   Current Outpatient Prescriptions  Medication Sig Dispense Refill  . albuterol (PROVENTIL) 4 MG tablet Take 4 mg by mouth 3 (three) times daily.    [name] albuterol-ipratropium (COMBIVENT) 18-103 MCG/ACT inhaler Inhale 1 puff into the lungs 4 (four) times daily. Coughing/ Shortness of Breath    . allopurinol (ZYLOPRIM) 300 MG tablet Take 300 mg by mouth daily.    . cholecalciferol (VITAMIN D) 1000 UNITS tablet Take 2,000 Units by mouth daily.    . Emollient (EUCERIN) lotion Apply 10 mLs topically as needed for dry skin.    . fenofibrate (TRICOR) 145 MG tablet TAKE 1 TABLET BY  MOUTH ONCE DAILY FOR CHOLESTEROL. 30 tablet 0  . ferrous sulfate 325 (65 FE) MG EC tablet Take 1 tablet (325 mg total) by mouth daily with breakfast. (Patient not taking: Reported on 06/08/2015)    . fish oil-omega-3 fatty acids 1000 MG capsule Take 1 capsule (1 g total) by mouth 2 (two) times daily.    . furosemide (LASIX) 40 MG tablet Take 1 tablet (40 mg total) by mouth daily. RESTART ON SATURDAY, 09/03/14. (Patient taking differently: Take 80 mg by mouth daily. )    . glimepiride (AMARYL) 1 MG tablet Take 1 mg by mouth 2 (two) times daily.    . isosorbide mononitrate (IMDUR) 30 MG 24 hr tablet TAKE ONE TABLET BY MOUTH ONCE DAILY. 30 tablet 0  . metoprolol succinate (TOPROL-XL) 50 MG 24 hr tablet Take 50 mg by mouth daily.    . nitroGLYCERIN (NITROSTAT) 0.4 MG SL tablet Place 1 tablet (0.4 mg total) under the tongue every 5 (five) minutes as needed for chest pain. 25 tablet 12  . oxyCODONE-acetaminophen (PERCOCET) 10-325 MG per tablet Take 1 tablet by mouth every 4 (four) hours as needed for pain. (Patient taking differently: Take 1 tablet by mouth every 3 (three) hours as needed for pain. ) 30 tablet 0  . pantoprazole (PROTONIX) 40 MG tablet Take 1 tablet (40 mg total) by mouth 2 (two) times daily before a meal. 60 tablet 2  . potassium chloride SA (K-DUR,KLOR-CON) 20 MEQ tablet Take 20 mEq  by mouth daily.      . pregabalin (LYRICA) 50 MG capsule Take one capsule by mouth twice daily for pains (Patient taking differently: Take 50 mg by mouth 3 (three) times daily. ) 60 capsule 5  . ramipril (ALTACE) 2.5 MG capsule Take 1 capsule (2.5 mg total) by mouth 2 (two) times daily. (Patient taking differently: Take 2.5-5 mg by mouth 2 (two) times daily. Takes '5mg'$  capsule daily in the morning and takes 2.'5mg'$  capsule at bedtime) 60 capsule 3  . rosuvastatin (CRESTOR) 20 MG tablet TAKE (1) TABLET BY MOUTH AT BEDTIME FOR CHOLESTEROL. 30 tablet 0  . sitaGLIPtin (JANUVIA) 50 MG tablet Take 50 mg by mouth daily.     . sucralfate (CARAFATE) 1 GM/10ML suspension Take 10 mLs (1 g total) by mouth 4 (four) times daily -  with meals and at bedtime. 420 mL 0  . vitamin C (ASCORBIC ACID) 500 MG tablet Take 500 mg by mouth daily.    [name] warfarin (COUMADIN) 5 MG tablet Take 1 tablet by mouth daily or as directed by coumadin clinic. (Patient taking differently: Take 2.5-5 mg by mouth. 2.'5mg'$  on Mondays and Fridays only. Takes '5mg'$  on all other day) 30 tablet 1   No current facility-administered medications for this visit.     Assessment:   Chest Pain-educated on calling 911 for unrelieved chest pain, also, patient will check on NTG prescription from Cardiologist HF-Education around chest pain, HF zones, daily weights given today GI bleed-risk for readmission  Plan:  [facility] CM Care Plan Problem One        Most Recent Value   Care Plan Problem One  Readmission for GI bleed   Role Documenting the Problem One  Care Management [place] for Problem One  Active   [facility] Long Term Goal (31-90 days)  Patient will not have readmission for GI bleed over next 31 days   [facility] Long Term Goal Start Date  07/11/15   Interventions for Problem One Long Term Goal  RNCM    [facility] CM Short Term Goal #2 (0-30 days)  Patient will take medication as prescribed   [facility] CM Short Term Goal #2 Start Date  06/27/15   Interventions for Short Term Goal #2  RNCM reviewed medications, verified that daughter still managing    [employer] CM Care Plan Problem Two        Most Recent Value   Care Plan Problem Two  knowledge deficit related to HF dx as evidenced by questions persented by patient and daughter   Role Documenting the Problem Two  Care Management [place] for Problem Two  Active   Interventions for Problem Two Long Term Goal   Using teachback method reviewed signs and symptoms of HF, also encouraged daily weight monitoring and logging   [facility] Long Term Goal (31-90) days  Patient will not be hospitalized for HF dx in next 90  days   [facility] Long Term Goal Start Date  06/08/15   [facility] CM Short Term Goal #1 (0-30 days)  Patient will become familiar with HF zones and be able to recognize symptoms and when to call doctor and 911, in the next 30 days   [facility] CM Short Term Goal #1 Start Date  07/19/15 Billy Fischer goal ]   Interventions for Short Term Goal #2   Emmi Education given and reviewed with patient on HF home monitoring and  daily weights    [facility] CM Care Plan Problem  Three        Most Recent Value   Care Plan Problem Three  Chest pain as evidenced by patient verbalized complaint   Role Documenting the Problem Three  Care Management Coordinator   Care Plan for Problem Three  Active   [facility] CM Short Term Goal #1 (0-30 days)  Patient will report any unrelieved chest pain to MD over the next 14 days   [facility] CM Short Term Goal #1 Start Date  07/19/15   Interventions for Short Term Goal #1  Using teachback reviewed calling 911 for unrelieved chest pain.     Will continue transition of care calls. Patient will have daughter make appointment for Cardiologist that he missed due to his recent hospitalization.  Royetta Crochet. Laymond Purser, RN, BSN, [place] [phone]

## 2015-07-20 NOTE — Telephone Encounter (Signed)
Rx request sent to pharmacy.  

## 2015-07-20 NOTE — Telephone Encounter (Signed)
REFILL 

## 2015-07-21 ENCOUNTER — Other Ambulatory Visit: Payer: Self-pay | Admitting: Cardiovascular Disease

## 2015-07-25 ENCOUNTER — Other Ambulatory Visit: Payer: Self-pay | Admitting: Cardiovascular Disease

## 2015-07-25 DIAGNOSIS — Z954 Presence of other heart-valve replacement: Secondary | ICD-10-CM | POA: Diagnosis not present

## 2015-07-25 DIAGNOSIS — Z7901 Long term (current) use of anticoagulants: Secondary | ICD-10-CM | POA: Diagnosis not present

## 2015-07-25 LAB — COAGUCHEK XS/INR WAIVED
INR: 3.5 — ABNORMAL HIGH (ref 0.9–1.1)
PROTHROMBIN TIME: 42.3 s

## 2015-07-26 ENCOUNTER — Ambulatory Visit (INDEPENDENT_AMBULATORY_CARE_PROVIDER_SITE_OTHER): Payer: Medicare Other | Admitting: Pharmacist Clinician (PhC)/ Clinical Pharmacy Specialist

## 2015-07-26 ENCOUNTER — Other Ambulatory Visit: Payer: Self-pay | Admitting: *Deleted

## 2015-07-26 DIAGNOSIS — Z954 Presence of other heart-valve replacement: Secondary | ICD-10-CM

## 2015-07-26 DIAGNOSIS — Z952 Presence of prosthetic heart valve: Secondary | ICD-10-CM

## 2015-07-26 DIAGNOSIS — Z7901 Long term (current) use of anticoagulants: Secondary | ICD-10-CM

## 2015-07-27 ENCOUNTER — Ambulatory Visit (INDEPENDENT_AMBULATORY_CARE_PROVIDER_SITE_OTHER): Payer: Medicare Other | Admitting: Cardiovascular Disease

## 2015-07-27 VITALS — BP 126/60 | HR 83 | Ht 70.75 in | Wt 212.1 lb

## 2015-07-27 DIAGNOSIS — Z8719 Personal history of other diseases of the digestive system: Secondary | ICD-10-CM | POA: Diagnosis not present

## 2015-07-27 DIAGNOSIS — I440 Atrioventricular block, first degree: Secondary | ICD-10-CM

## 2015-07-27 DIAGNOSIS — I2510 Atherosclerotic heart disease of native coronary artery without angina pectoris: Secondary | ICD-10-CM

## 2015-07-27 DIAGNOSIS — R079 Chest pain, unspecified: Secondary | ICD-10-CM | POA: Diagnosis not present

## 2015-07-27 DIAGNOSIS — D6489 Other specified anemias: Secondary | ICD-10-CM | POA: Diagnosis not present

## 2015-07-27 DIAGNOSIS — E785 Hyperlipidemia, unspecified: Secondary | ICD-10-CM | POA: Diagnosis not present

## 2015-07-27 DIAGNOSIS — I10 Essential (primary) hypertension: Secondary | ICD-10-CM

## 2015-07-27 DIAGNOSIS — K2901 Acute gastritis with bleeding: Secondary | ICD-10-CM

## 2015-07-27 DIAGNOSIS — I447 Left bundle-branch block, unspecified: Secondary | ICD-10-CM

## 2015-07-27 MED ORDER — ISOSORBIDE MONONITRATE ER 60 MG PO TB24: 60.0000 mg | ORAL_TABLET | Freq: Every day | ORAL | Status: DC

## 2015-07-27 MED ORDER — SUCRALFATE 1 GM/10ML PO SUSP: 1.0000 g | Freq: Three times a day (TID) | ORAL | Status: DC

## 2015-07-27 NOTE — Patient Outreach (Signed)
  Transition of Care Call (patient was inpatient for GI bleed)  Spoke with patient, patient reports he has had 2 bouts of chest pain in the last 24 hours, he used NTG x 2 both episodes with good relief. Patient asking why call 911 versus driving self or someone else taking to ED, RNCM educated patient   RNCM instructed patient on chest pain and NTG use and calling 911 RNCM encouraged patient to reschedule the missed appointment with Dr. [name] (patient was inpatient) and to report chest pain episodes, patient agrees to plan RNCM will continue transition of care calls  [facility] CM Care Plan Problem One        Most Recent Value   Care Plan Problem One  Readmission for GI bleed   Role Documenting the Problem One  Care Management Coordinator   THN CM Short Term Goal #2 (0-30 days)  Patient will take medication as prescribed   THN CM Short Term Goal #2 Start Date  06/27/15   Interventions for Short Term Goal #2  Using teachback, reviewed new medication NTG, reviewed use and reeducated on if not relief to call 911. answered questions around calling 911 vs drving to ED risk and benefits    [facility] CM Care Plan Problem Three        Most Recent Value   Care Plan Problem Three  Chest pain as evidenced by patient verbalized complaint   Role Documenting the Problem Three  Care Management Coordinator   Care Plan for Problem Three  Active   THN CM Short Term Goal #1 (0-30 days)  Patient will report any unrelieved chest pain to MD over the next 14 days   Interventions for Short Term Goal #1  Using teachback, reviewed chest pain and NTG use protocol, educated on calling 911 vs driving self to ED      [facility] E. Laymond Purser, RN, BSN, [place] [phone]

## 2015-07-27 NOTE — Patient Instructions (Signed)
Your physician has recommended you make the following change in your medication: the isosorbide has been increased to 60 mg daily. A prescription has also been sent in for the carafate.  Your physician recommends that you return for lab work fasting.  Your physician has requested that you have a lexiscan myoview. For further information please visit [email or web address]. Please follow instruction sheet, as given.  Your physician recommends that you schedule a follow-up appointment in: 2 months with Dr [name].

## 2015-07-28 ENCOUNTER — Emergency Department (HOSPITAL_COMMUNITY): Payer: Medicare Other

## 2015-07-28 ENCOUNTER — Encounter (HOSPITAL_COMMUNITY): Payer: Self-pay | Admitting: Emergency Medicine

## 2015-07-28 ENCOUNTER — Emergency Department (HOSPITAL_COMMUNITY)
Admission: EM | Admit: 2015-07-28 | Discharge: 2015-07-28 | Disposition: A | Discharge status: Home / Self Care | Payer: Medicare Other | Attending: Emergency Medicine | Admitting: Emergency Medicine

## 2015-07-28 DIAGNOSIS — Z87442 Personal history of urinary calculi: Secondary | ICD-10-CM | POA: Diagnosis not present

## 2015-07-28 DIAGNOSIS — Z87891 Personal history of nicotine dependence: Secondary | ICD-10-CM | POA: Insufficient documentation

## 2015-07-28 DIAGNOSIS — E119 Type 2 diabetes mellitus without complications: Secondary | ICD-10-CM | POA: Diagnosis not present

## 2015-07-28 DIAGNOSIS — I252 Old myocardial infarction: Secondary | ICD-10-CM | POA: Diagnosis not present

## 2015-07-28 DIAGNOSIS — Z7901 Long term (current) use of anticoagulants: Secondary | ICD-10-CM | POA: Diagnosis not present

## 2015-07-28 DIAGNOSIS — R079 Chest pain, unspecified: Secondary | ICD-10-CM | POA: Diagnosis present

## 2015-07-28 DIAGNOSIS — G8929 Other chronic pain: Secondary | ICD-10-CM | POA: Diagnosis not present

## 2015-07-28 DIAGNOSIS — J449 Chronic obstructive pulmonary disease, unspecified: Secondary | ICD-10-CM | POA: Diagnosis not present

## 2015-07-28 DIAGNOSIS — I2510 Atherosclerotic heart disease of native coronary artery without angina pectoris: Secondary | ICD-10-CM | POA: Insufficient documentation

## 2015-07-28 DIAGNOSIS — I5022 Chronic systolic (congestive) heart failure: Secondary | ICD-10-CM | POA: Diagnosis not present

## 2015-07-28 DIAGNOSIS — M109 Gout, unspecified: Secondary | ICD-10-CM | POA: Insufficient documentation

## 2015-07-28 DIAGNOSIS — I10 Essential (primary) hypertension: Secondary | ICD-10-CM | POA: Diagnosis not present

## 2015-07-28 DIAGNOSIS — M1990 Unspecified osteoarthritis, unspecified site: Secondary | ICD-10-CM | POA: Insufficient documentation

## 2015-07-28 DIAGNOSIS — R0789 Other chest pain: Secondary | ICD-10-CM | POA: Diagnosis not present

## 2015-07-28 DIAGNOSIS — Z7984 Long term (current) use of oral hypoglycemic drugs: Secondary | ICD-10-CM | POA: Insufficient documentation

## 2015-07-28 DIAGNOSIS — I259 Chronic ischemic heart disease, unspecified: Secondary | ICD-10-CM | POA: Insufficient documentation

## 2015-07-28 DIAGNOSIS — Z872 Personal history of diseases of the skin and subcutaneous tissue: Secondary | ICD-10-CM | POA: Diagnosis not present

## 2015-07-28 DIAGNOSIS — J439 Emphysema, unspecified: Secondary | ICD-10-CM | POA: Diagnosis not present

## 2015-07-28 DIAGNOSIS — K219 Gastro-esophageal reflux disease without esophagitis: Secondary | ICD-10-CM | POA: Insufficient documentation

## 2015-07-28 DIAGNOSIS — Z79899 Other long term (current) drug therapy: Secondary | ICD-10-CM | POA: Diagnosis not present

## 2015-07-28 DIAGNOSIS — Z951 Presence of aortocoronary bypass graft: Secondary | ICD-10-CM | POA: Diagnosis not present

## 2015-07-28 DIAGNOSIS — D649 Anemia, unspecified: Secondary | ICD-10-CM | POA: Insufficient documentation

## 2015-07-28 DIAGNOSIS — E782 Mixed hyperlipidemia: Secondary | ICD-10-CM | POA: Insufficient documentation

## 2015-07-28 LAB — URINALYSIS, ROUTINE W REFLEX MICROSCOPIC
Bilirubin Urine: NEGATIVE
Glucose, UA: NEGATIVE mg/dL
Hgb urine dipstick: NEGATIVE
KETONES UR: NEGATIVE mg/dL
LEUKOCYTES UA: NEGATIVE
NITRITE: NEGATIVE
PH: 5.5 (ref 5.0–8.0)
Protein, ur: NEGATIVE mg/dL
Specific Gravity, Urine: 1.02 (ref 1.005–1.030)

## 2015-07-28 LAB — COMPREHENSIVE METABOLIC PANEL
ALT: 15 U/L — ABNORMAL LOW (ref 17–63)
AST: 20 U/L (ref 15–41)
Albumin: 4.5 g/dL (ref 3.5–5.0)
Alkaline Phosphatase: 35 U/L — ABNORMAL LOW (ref 38–126)
Anion gap: 12 (ref 5–15)
BUN: 26 mg/dL — ABNORMAL HIGH (ref 6–20)
CO2: 27 mmol/L (ref 22–32)
Calcium: 10.1 mg/dL (ref 8.9–10.3)
Chloride: 101 mmol/L (ref 101–111)
Creatinine, Ser: 1.34 mg/dL — ABNORMAL HIGH (ref 0.61–1.24)
GFR calc Af Amer: 59 mL/min — ABNORMAL LOW (ref >60)
GFR calc non Af Amer: 51 mL/min — ABNORMAL LOW (ref >60)
Glucose, Bld: 199 mg/dL — ABNORMAL HIGH (ref 65–99)
Potassium: 3.8 mmol/L (ref 3.5–5.1)
Sodium: 140 mmol/L (ref 135–145)
Total Bilirubin: 0.6 mg/dL (ref 0.3–1.2)
Total Protein: 7.7 g/dL (ref 6.5–8.1)

## 2015-07-28 LAB — BRAIN NATRIURETIC PEPTIDE: B Natriuretic Peptide: 156 pg/mL — ABNORMAL HIGH (ref 0.0–100.0)

## 2015-07-28 LAB — CBC
HCT: 32.7 % — ABNORMAL LOW (ref 39.0–52.0)
Hemoglobin: 10.2 g/dL — ABNORMAL LOW (ref 13.0–17.0)
MCH: 26.6 pg (ref 26.0–34.0)
MCHC: 31.2 g/dL (ref 30.0–36.0)
MCV: 85.2 fL (ref 78.0–100.0)
PLATELETS: 298 10*3/uL (ref 150–400)
RBC: 3.84 MIL/uL — AB (ref 4.22–5.81)
RDW: 19.7 % — ABNORMAL HIGH (ref 11.5–15.5)
WBC: 8.8 10*3/uL (ref 4.0–10.5)

## 2015-07-28 LAB — I-STAT TROPONIN, ED: TROPONIN I, POC: 0.02 ng/mL (ref 0.00–0.08)

## 2015-07-28 MED ORDER — SUCRALFATE 1 GM/10ML PO SUSP: 1.0000 g | Freq: Three times a day (TID) | ORAL | Status: DC

## 2015-07-28 MED ORDER — SODIUM CHLORIDE 0.9 % IV BOLUS (SEPSIS)
1000.0000 mL | Freq: Once | INTRAVENOUS | Status: AC
  Administered 2015-07-28: 1000 mL via INTRAVENOUS

## 2015-07-28 MED ORDER — GI COCKTAIL ~~LOC~~
30.0000 mL | Freq: Once | ORAL | Status: AC
  Administered 2015-07-28: 30 mL via ORAL
  Filled 2015-07-28: qty 30

## 2015-07-28 MED ORDER — SUCRALFATE 1 GM/10ML PO SUSP
1.0000 g | Freq: Once | ORAL | Status: AC
  Administered 2015-07-28: 1 g via ORAL
  Filled 2015-07-28: qty 10

## 2015-07-28 NOTE — ED Provider Notes (Signed)
CSN: [id number]     Arrival date & time 07/28/15  1916 History   First MD Initiated Contact with Patient 07/28/15 1929     Chief Complaint  Patient presents with  . Chest Pain     (Consider location/radiation/quality/duration/timing/severity/associated sxs/prior Treatment) HPI Patient presents with concern of chest pain. Over the past days he has had frequent episodes, including yesterday, while at cardiology evaluation. Today, the patient had recurrent episode, after eating something. Pain transiently improved with nitroglycerin, but came back. After second dose of nitroglycerin, the pain again improved, but did not resolve entirely, and he is now here for evaluation. Patient states the pain is typical for him, almost always occurring any typical space, left upper chest, with some local soreness. No dyspnea, nausea, fever, weakness anywhere. Patient has no ongoing abdominal pain, rectal bleeding.  Past Medical History  Diagnosis Date  . Type 2 diabetes mellitus ([place]) 2007  . Essential hypertension   . Gout   . Hypercholesteremia   . Peripheral vascular disease ([place])     Lower extremity PCI/stenting  . COPD (chronic obstructive pulmonary disease) ([place])   . S/P aortic valve replacement 1990    a. St. Jude  . Chronic back pain   . Dysphagia   . Neuromuscular disorder ([place])   . Peripheral neuropathy ([place])   . Critical lower limb ischemia   . CAD (coronary artery disease)     a. 05/13/14 [name] s/p overlapping DESx2 to SVG to RCA. b.  s/p CABG '90 with redo '94 & stent to RCA SVG in 2005  . Chronic toe ulcer ([place])     a. Left foot  . Chronic systolic heart failure ([place])   . History of kidney stones   . GERD (gastroesophageal reflux disease)   . Arthritis   . Sleep apnea   . Myocardial infarction ([place])   . GI bleeding 05/31/2015    Source not identified.  . Acute blood loss anemia 02/2015 & 05/2015    Hemoglobin 5.5 on 05/31/15; status post transfusion.  . CHF (congestive  heart failure) ([place])   . Asthma    Past Surgical History  Procedure Laterality Date  . Aortic valve replacement  1990    St. Jude  . Rotator cuff repair      right  . Cataract extraction      bilateral  . Coronary stent placement  2005    RCA vein graft A 3.0x13.0 TAXUS stent was then placed int he vessel a Viva 3.0x4.0 (perfusion balloon was made ready it was placed through the entire lenght of the stent  . Peripheral vascular procedures lower extremities      right external iliac  artery PTA and stenting as well as bilateral SFA intervention remotely. Repeat procedures in 2011 bilaterally  . Coronary artery bypass graft  1994    6 vessels  . [name]  06/13/2011    Procedure: Venia Minks DILATION;  Surgeon: [name], MD;  Location: [facility];  Service: Endoscopy;  Laterality: N/A;  Dilated to 56.   . Angioplasty illiac artery    . Back surgery  1962,2297    2  . Eye surgery    . Amputation Left 07/13/2014    Procedure: Transmetatarsal Amputation;  Surgeon: [name], MD;  Location: [place];  Service: Orthopedics;  Laterality: Left;  . Lower extremity angiogram N/A 02/15/2013    Procedure: LOWER EXTREMITY ANGIOGRAM;  Surgeon: [name], MD;  Location: [facility] CATH LAB;  Service:  Cardiovascular;  Laterality: N/A;  . Left heart catheterization with coronary angiogram N/A 05/11/2014    Procedure: LEFT HEART CATHETERIZATION WITH CORONARY ANGIOGRAM;  Surgeon: [name], MD;  Location[facility] CATH LAB;  Service: Cardiovascular;  Laterality: N/A;  . Percutaneous coronary stent intervention (pci-s) N/A 05/13/2014    Procedure: PERCUTANEOUS CORONARY STENT INTERVENTION (PCI-S);  Surgeon: [name], MD;  Location: [facility] CATH LAB;  Service: Cardiovascular;  Laterality: N/A;  . Lower extremity angiogram N/A 06/06/2014    Procedure: LOWER EXTREMITY ANGIOGRAM;  Surgeon: [name], MD;  Location: [facility] CATH LAB;  Service: Cardiovascular;  Laterality: N/A;  . Amputation  Left 08/20/2014    Procedure: Revision Transmetatarsal Amputation versus Below Knee Amputation;  Surgeon: [name], MD;  Location: [place];  Service: Orthopedics;  Laterality: Left;  . Stump revision Left 09/23/2014    Procedure: Revision Left Below Knee Amputation;  Surgeon: [name], MD;  Location: [place];  Service: Orthopedics;  Laterality: Left;  . Stump revision Left 10/13/2014    Procedure: REVISION LEFT BELOW KNEE AMPUTATION STUMP;  Surgeon: [name], MD;  Location: [facility];  Service: Orthopedics;  Laterality: Left;  . Esophagogastroduodenoscopy N/A 02/09/2015    DR. Schooler: Normal EGD  . Esophagogastroduodenoscopy  05/2011    Dr. [name]: s/p esophageal dilation, antral erosions/nodularity with benign biopsies  . Colonoscopy  05/2011    Dr. [name]: benign rectal polyp, left sided tics, ascending colonic ulcers (path c/w ischemia)  . Colonoscopy with propofol N/A 06/01/2015    RMR: normal appearing rectal mucosa. Scattered left-sided diverticula. the remainder of the colonic mucosa appeared normal. the distal 5 cm of terminal ileal mucosa also appeared normal. retroflexion was performed.   Freda Munro capsule study N/A 06/08/2015    Procedure: GIVENS CAPSULE STUDY;  Surgeon: [name], MD;  Location: [facility];  Service: Endoscopy;  Laterality: N/A;  0700   . Esophagogastroduodenoscopy (egd) with propofol N/A 06/20/2015    Procedure: ESOPHAGOGASTRODUODENOSCOPY (EGD) WITH PROPOFOL;  Surgeon: Danie Binder, MD;  Location: [facility];  Service: Endoscopy;  Laterality: N/A;  . Esophageal biopsy N/A 06/20/2015    Procedure: BIOPSY;  Surgeon: Danie Binder, MD;  Location: [facility];  Service: Endoscopy;  Laterality: N/A;   Family History  Problem Relation Age of Onset  . Colon cancer Neg Hx   . Liver disease Neg Hx   . Diabetes Father   . Heart failure Other    Social History  Substance Use Topics  . Smoking status: Former Smoker -- 1.00 packs/day for 55 years    Types:  Cigarettes    Start date: 08/19/1953    Quit date: 05/07/2014  . Smokeless tobacco: Current User    Types: Chew    Last Attempt to Quit: 08/20/1995     Comment: Has quit on 3 occasions. Counseling given today 5-10 minutes   I am more than likely going to quit "  . Alcohol Use: No     Comment: Socially, sometimes 12 ounce beer daily, may go month without/no whiskey    Review of Systems  Constitutional:       Per HPI, otherwise negative  HENT:       Per HPI, otherwise negative  Respiratory:       Per HPI, otherwise negative  Cardiovascular:       Per HPI, otherwise negative  Gastrointestinal: Negative for vomiting.  Endocrine:       Negative aside from HPI  Genitourinary:       Neg aside from HPI   Musculoskeletal:       Per HPI, otherwise negative  Skin: Negative.   Neurological: Negative for syncope.      Allergies  Review of patient's allergies indicates no known allergies.  Home Medications   Prior to Admission medications   Medication Sig Start Date End Date Taking? Authorizing Provider  albuterol (PROVENTIL) 4 MG tablet Take 4 mg by mouth 3 (three) times daily. 01/20/15   Historical Provider, MD  albuterol-ipratropium (COMBIVENT) 18-103 MCG/ACT inhaler Inhale 1 puff into the lungs 4 (four) times daily. Coughing/ Shortness of Breath    Historical Provider, MD  allopurinol (ZYLOPRIM) 300 MG tablet Take 300 mg by mouth daily.    Historical Provider, MD  cholecalciferol (VITAMIN D) 1000 UNITS tablet Take 2,000 Units by mouth daily.    Historical Provider, MD  Emollient (EUCERIN) lotion Apply 10 mLs topically as needed for dry skin.    Historical Provider, MD  fenofibrate (TRICOR) 145 MG tablet TAKE 1 TABLET BY MOUTH ONCE DAILY FOR CHOLESTEROL. 07/21/15   [name], MD  ferrous sulfate 325 (65 FE) MG EC tablet Take 1 tablet (325 mg total) by mouth daily with breakfast. 06/02/15   Rexene Alberts, MD  fish oil-omega-3 fatty acids 1000 MG capsule Take 1 capsule (1 g  total) by mouth 2 (two) times daily. 01/22/13   Casimiro Needle [place]vstad, RPH-CPP  furosemide (LASIX) 40 MG tablet Take 1 tablet (40 mg total) by mouth daily. RESTART ON SATURDAY, 09/03/14. Patient taking differently: Take 80 mg by mouth daily.  06/02/15   Rexene Alberts, MD  glimepiride (AMARYL) 1 MG tablet Take 1 mg by mouth 2 (two) times daily.    Historical Provider, MD  isosorbide mononitrate (IMDUR) 60 MG 24 hr tablet Take 1 tablet (60 mg total) by mouth daily. 07/27/15   Troy Sine, MD  metoprolol succinate (TOPROL-XL) 50 MG 24 hr tablet Take 50 mg by mouth daily. 06/22/15   Historical Provider, MD  NITROSTAT 0.4 MG SL tablet PLACE 1 TAB UNDER TONGUE EVERY 5 MIN IF NEEDED FOR CHEST PAIN. MAY USE 3 TIMES.NO RELIEF CALL 911. 07/20/15   Eileen Stanford, PA-C  oxyCODONE-acetaminophen (PERCOCET) 10-325 MG per tablet Take 1 tablet by mouth every 4 (four) hours as needed for pain. Patient taking differently: Take 1 tablet by mouth every 3 (three) hours as needed for pain.  09/23/14   [name], MD  pantoprazole (PROTONIX) 40 MG tablet Take 1 tablet (40 mg total) by mouth 2 (two) times daily before a meal. 06/23/15   Erline Hau, MD  potassium chloride SA (K-DUR,KLOR-CON) 20 MEQ tablet Take 20 mEq by mouth daily.      Historical Provider, MD  pregabalin (LYRICA) 50 MG capsule Take one capsule by mouth twice daily for pains Patient taking differently: Take 50 mg by mouth 3 (three) times daily.  08/23/14   Blanchie Serve, MD  ramipril (ALTACE) 2.5 MG capsule Take 1 capsule (2.5 mg total) by mouth 2 (two) times daily. Patient taking differently: Take 2.5-5 mg by mouth 2 (two) times daily. Takes '5mg'$  capsule daily in the morning and takes 2.'5mg'$  capsule at bedtime 06/02/15   Rexene Alberts, MD  ramipril (ALTACE) 5 MG capsule TAKE 1 CAPSULE BY MOUTH EVERY MORNING. 07/21/15   [name], MD  rosuvastatin (CRESTOR) 20 MG tablet TAKE (1) TABLET BY MOUTH AT BEDTIME FOR CHOLESTEROL. 07/21/15   Pearletha Forge  [name], MD  sitaGLIPtin (JANUVIA) 50 MG tablet Take 50 mg by mouth daily.    Historical Provider, MD  sucralfate (CARAFATE) 1 GM/10ML suspension Take 10 mLs (1 g total) by mouth 4 (four) times daily -  with meals and at bedtime. 07/27/15   Troy Sine, MD  vitamin C (ASCORBIC ACID) 500 MG tablet Take 500 mg by mouth daily.    Historical Provider, MD  warfarin (COUMADIN) 5 MG tablet Take 1 tablet by mouth daily or as directed by coumadin clinic. Patient taking differently: Take 2.5-5 mg by mouth. 2.'5mg'$  on Mondays and Fridays only. Takes '5mg'$  on all other day 06/02/15   Rexene Alberts, MD   BP 188/91 mmHg  Pulse 119  Temp(Src) 98.1 F (36.7 C) (Oral)  Resp 24  Ht 5' 10.5" (1.791 m)  Wt 210 lb (95.255 kg)  BMI 29.70 kg/m2  SpO2 100% Physical Exam  Constitutional: He is oriented to person, place, and time. He appears well-developed. No distress.  HENT:  Head: Normocephalic and atraumatic.  Eyes: Conjunctivae and EOM are normal.  Cardiovascular: Normal rate, regular rhythm and intact distal pulses.   Pulmonary/Chest: Effort normal. No stridor. No respiratory distress.    Abdominal: He exhibits no distension.  Musculoskeletal: He exhibits no edema.  Left leg amputation  Neurological: He is alert and oriented to person, place, and time.  Skin: Skin is warm and dry.  Psychiatric: He has a normal mood and affect.  Nursing note and vitals reviewed.   ED Course  Procedures (including critical care time) Labs Review Labs Reviewed  CBC - Abnormal; Notable for the following:    RBC 3.84 (*)    Hemoglobin 10.2 (*)    HCT 32.7 (*)    RDW 19.7 (*)    All other components within normal limits  COMPREHENSIVE METABOLIC PANEL - Abnormal; Notable for the following:    Glucose, Bld 199 (*)    BUN 26 (*)    Creatinine, Ser 1.34 (*)    ALT 15 (*)    Alkaline Phosphatase 35 (*)    GFR calc non Af Amer 51 (*)    GFR calc Af Amer 59 (*)    All other components within normal limits  BRAIN  NATRIURETIC PEPTIDE - Abnormal; Notable for the following:    B Natriuretic Peptide 156.0 (*)    All other components within normal limits  URINALYSIS, ROUTINE W REFLEX MICROSCOPIC (NOT AT [facility])  Randolm Idol, ED    Imaging Review Dg Chest Portable 1 View  07/28/2015  CLINICAL DATA:  Acute onset chest pain.  Coronary artery disease. EXAM: PORTABLE CHEST 1 VIEW COMPARISON:  06/15/2015 FINDINGS: Cardiomegaly remains stable. Elevation of left hemidiaphragm is unchanged. Pulmonary emphysema again demonstrated. No evidence of pulmonary infiltrate or edema. No evidence of pneumothorax or pleural effusion. Prior CABG again noted. IMPRESSION: Stable cardiomegaly and emphysema.  No acute findings. Electronically Signed   By: Earle Gell M.D.   On: 07/28/2015 20:05   I have personally reviewed and evaluated these images and lab results as part of my medical decision-making.   EKG Interpretation   Date/Time:  Friday July 28 2015 19:24:50 EST Ventricular Rate:  119 PR Interval:  89 QRS Duration: 155 QT Interval:  394 QTC Calculation: 554 R Axis:   2 Text Interpretation:  Sinus tachycardia Left bundle branch block Confirmed  by Wilson Singer  MD, STEPHEN ([id number]) on 07/28/2015 7:29:59 PM     Chart evaluation notable for cardiology evaluation within the past  month, with discussion regarding the patient's need for antiplatelets or antiplatelets, but given concern for ongoing GI bleed, his inability to tolerate this type of medication.  9:42 PM Pain down to 1 out of 10. I reviewed all labs, imaging with the patient and a companion. We discussed the patient's cardiology evaluation yesterday, his ongoing bouts of therapy, given his history of GI bleeds, need for some medication to decrease risk for heart disease. With unremarkable troponin, nonischemic EKG, patient will follow-up with cardiology office.  MDM  This elderly male patient presents with concern of recurrent chest pain.  Here, labs are  largely reassuring, the patient has substantial reduction in pain following GI cocktail. Patient does have coronary disease, but today has had ischemic EKG, negative troponin, and with changing symptoms following provision of GI cocktail, and unremarkable cardiology evaluation within the past 24 hours, there is low suspicion for atypical ACS. Patient did have mild elevation in creatinine, and received 1 L fluid resuscitation. Symptoms most likely gastroesophageal. No evidence for decompensated heart failure either. With improvement in his condition he will follow-up as an outpatient.    Carmin Muskrat, MD 07/28/15 2144

## 2015-07-28 NOTE — ED Notes (Signed)
Pt reports he had onset of chest pain at approx 1800 today. Pt has prior cardiac hx. Pt has taken 2 nitro SL. Pt reports first nitro helped but second has not helped.

## 2015-07-28 NOTE — Discharge Instructions (Signed)
As discussed, your evaluation today has been largely reassuring.  But, it is important that you monitor your condition carefully, and do not hesitate to return to the ED if you develop new, or concerning changes in your condition. ? ?Otherwise, please follow-up with your physician for appropriate ongoing care. ? ?

## 2015-07-29 ENCOUNTER — Encounter: Payer: Self-pay | Admitting: Cardiovascular Disease

## 2015-07-29 DIAGNOSIS — R079 Chest pain, unspecified: Secondary | ICD-10-CM | POA: Insufficient documentation

## 2015-07-29 DIAGNOSIS — I2510 Atherosclerotic heart disease of native coronary artery without angina pectoris: Secondary | ICD-10-CM | POA: Insufficient documentation

## 2015-07-29 NOTE — Progress Notes (Signed)
Patient ID: Tanner Pena, male   DOB: 1940/12/24, 74 y.o.   MRN: [id number]    HPI: Tanner Pena is a 74 y.o. male who presents to the office today for cardiologic followup evaluation.  I last saw him 18 months ago in April 2015.  Mr. Tanner Pena has a, history of CAD, PVD and aortic stenosis. In 1991 he underwent initial CABG revascularization surgery to his RCA and also underwent St. Jude's aortic valve replacement surgery for aortic valve stenosis. In 1994, he required redo CABG surgery to his left coronary system which was not bypassed in 1991.  In 2005, a stent was placed in his RCA vein graft the patient has significant peripheral vascular disease and is status post intervention to both his right iliac and bilateral SFAs with Dr. [name] and has also undergone rotational atherectomy of his SFAs. Additionally, he has  mild to moderate carotid stenosis, a history of hypertension, tobacco use, type 2 diabetes mellitus, as well as obstructive sleep apnea. A carotid Doppler study done in September 2013 showed moderate amount of fibrous plaque and right carotid internal stenosis of less than 49% both the right external carotid narrowing of 70-99%. 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 His left  internal carotid revealed 50-69% of diameter reduction. Mr Tanner Pena denies recent episodes of chest pain. He continues to smoke cigarettes less than one pack per day and has been smoking approximately 60 years , although he did quit on 3 occasions. He does note shortness of breath with activity. He denies loss of symptoms of claudication.  He was hospitalized on August 09, 2013 for recurrent chest pain symptoms. Cardiac enzymes were negative. He was hypertensive on admission with a blood pressure of 206/117. He was treated with diuretic therapy and initially his Coumadin was held. A Myoview study   showed scar without ischemia and consequently Coumadin was resumed and cardiac catheterization was not done.  sedation was His ejection fraction on his Myoview study in December 2014 was 30%. He does have chronic left bundle branch block. He has COPD. He also has continued claudication in the left lower extremity due to chronic disease not amenable to PTA.   A followup echo Doppler study showed an ejection fraction of 40-45% on 10/20/2013.  His St. Jude's mechanical aortic valve was not well visualized.  His peak and mean aortic gradient is were 33 and 19 mm, but there was concern perhaps this may underestimate the severity of his potential stenosis due to his reduced cardiac output.  He did have global hypokinesis with regional variation and grade 1 diastolic dysfunction.  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  He also has had several GI bleeds. He was taken off Plavix in October.  He had another upper GI bleed on Coumadin therapy in November 2016.  His last hospitalization was  November 15 through 07/09/2015.  He had a recent history of gastritis which has been complicated by his requirement for anticoagulation with his mechanical aVR.  His INR was supratherapeutic on admission.  He was seen by GI who did not recommend repeat EGD at that time.  His bleeding stabilized.  During his recent [facility] hospitalization a follow-up echo Doppler study on 06/21/2015 showed an ejection fraction at 40%.  There was diffuse hypokinesis and grade 2 diastolic dysfunction.  His mechanical valve in the aortic  position was well-seated.  Mean gradient 15, peak gradient 37 mm.  There was no aortic insufficiency.    Mr. Tanner Pena  states that he just ran out of his sucralfate for the past several days since he has not been taking the sucralfate.  He has noticed some recurrent chest pain.  He has tried nitroglycerin for this with some benefit.  He is unaware of any palpitations.  He denies  significant swelling.  He has not had laboratory since his hospitalization.  He presents for evaluation.  Past Medical History  Diagnosis Date  . Type 2 diabetes mellitus ([place]) 2007  . Essential hypertension   . Gout   . Hypercholesteremia   . Peripheral vascular disease ([place])     Lower extremity PCI/stenting  . COPD (chronic obstructive pulmonary disease) ([place])   . S/P aortic valve replacement 1990    a. St. Jude  . Chronic back pain   . Dysphagia   . Neuromuscular disorder ([place])   . Peripheral neuropathy ([place])   . Critical lower limb ischemia   . CAD (coronary artery disease)     a. 05/13/14 [name] s/p overlapping DESx2 to SVG to RCA. b.  s/p CABG '90 with redo '94 & stent to RCA SVG in 2005  . Chronic toe ulcer ([place])     a. Left foot  . Chronic systolic heart failure ([place])   . History of kidney stones   . GERD (gastroesophageal reflux disease)   . Arthritis   . Sleep apnea   . Myocardial infarction ([place])   . GI bleeding 05/31/2015    Source not identified.  . Acute blood loss anemia 02/2015 & 05/2015    Hemoglobin 5.5 on 05/31/15; status post transfusion.  . CHF (congestive heart failure) ([place])   . Asthma     Past Surgical History  Procedure Laterality Date  . Aortic valve replacement  1990    St. Jude  . Rotator cuff repair      right  . Cataract extraction      bilateral  . Coronary stent placement  2005    RCA vein graft A 3.0x13.0 TAXUS stent was then placed int he vessel a Viva 3.0x4.0 (perfusion balloon was made ready it was placed through the entire lenght of the stent  . Peripheral vascular procedures lower extremities      right external iliac  artery PTA and stenting as well as bilateral SFA intervention remotely. Repeat procedures in 2011 bilaterally  . Coronary artery bypass graft  1994    6 vessels  . [name]  06/13/2011    Procedure: Venia Minks DILATION;  Surgeon: [name], MD;  Location: [facility];  Service: Endoscopy;  Laterality: N/A;   Dilated to 56.   . Angioplasty illiac artery    . Back surgery  [id number]    2  . Eye surgery    . Amputation Left 07/13/2014    Procedure: Transmetatarsal Amputation;  Surgeon: [name], MD;  Location: [facility];  Service: Orthopedics;  Laterality: Left;  . Lower extremity angiogram N/A 02/15/2013    Procedure: LOWER EXTREMITY ANGIOGRAM;  Surgeon: [name], MD;  Location: [facility] CATH LAB;  Service: Cardiovascular;  Laterality: N/A;  . Left heart catheterization with coronary angiogram N/A 05/11/2014    Procedure: LEFT HEART CATHETERIZATION WITH CORONARY ANGIOGRAM;  Surgeon: [name], MD;  Location: [facility] CATH LAB;  Service: Cardiovascular;  Laterality: N/A;  . Percutaneous coronary stent intervention (pci-s) N/A 05/13/2014  Procedure: PERCUTANEOUS CORONARY STENT INTERVENTION (PCI-S);  Surgeon: [name], MD;  Location: [facility] CATH LAB;  Service: Cardiovascular;  Laterality: N/A;  . Lower extremity angiogram N/A 06/06/2014    Procedure: LOWER EXTREMITY ANGIOGRAM;  Surgeon: [name], MD;  Location: [facility] C[facility] LAB;  Service: Cardiovascular;  Laterality: N/A;  . Amputation Left 08/20/2014    Procedure: Revision Transmetatarsal Amputation versus Below Knee Amputation;  Surgeon: [name], MD;  Location: [place];  Service: Orthopedics;  Laterality: Left;  . Stump revision Left 09/23/2014    Procedure: Revision Left Below Knee Amputation;  Surgeon: [name], MD;  Location: [place];  Service: Orthopedics;  Laterality: Left;  . Stump revision Left 10/13/2014    Procedure: REVISION LEFT BELOW KNEE AMPUTATION STUMP;  Surgeon: [name], MD;  Location: [facility];  Service: Orthopedics;  Laterality: Left;  . Esophagogastroduodenoscopy N/A 02/09/2015    DR. Schooler: Normal EGD  . Esophagogastroduodenoscopy  05/2011    Dr. [name]: s/p esophageal dilation, antral erosions/nodularity with benign biopsies  . Colonoscopy  05/2011    Dr. [name]: benign rectal polyp, left  sided tics, ascending colonic ulcers (path c/w ischemia)  . Colonoscopy with propofol N/A 06/01/2015    RMR: normal appearing rectal mucosa. Scattered left-sided diverticula. the remainder of the colonic mucosa appeared normal. the distal 5 cm of terminal ileal mucosa also appeared normal. retroflexion was performed.   Freda Munro capsule study N/A 06/08/2015    Procedure: GIVENS CAPSULE STUDY;  Surgeon: [name], MD;  Location: [facility];  Service: Endoscopy;  Laterality: N/A;  0700   . Esophagogastroduodenoscopy (egd) with propofol N/A 06/20/2015    Procedure: ESOPHAGOGASTRODUODENOSCOPY (EGD) WITH PROPOFOL;  Surgeon: Danie Binder, MD;  Location: [facility];  Service: Endoscopy;  Laterality: N/A;  . Esophageal biopsy N/A 06/20/2015    Procedure: BIOPSY;  Surgeon: Danie Binder, MD;  Location: [facility];  Service: Endoscopy;  Laterality: N/A;    No Known Allergies  Current Outpatient Prescriptions  Medication Sig Dispense Refill  . albuterol (PROVENTIL) 4 MG tablet Take 4 mg by mouth 3 (three) times daily.    [name] albuterol-ipratropium (COMBIVENT) 18-103 MCG/ACT inhaler Inhale 1 puff into the lungs 4 (four) times daily. Coughing/ Shortness of Breath    . allopurinol (ZYLOPRIM) 300 MG tablet Take 300 mg by mouth daily.    . cholecalciferol (VITAMIN D) 1000 UNITS tablet Take 2,000 Units by mouth daily.    . Emollient (EUCERIN) lotion Apply 10 mLs topically as needed for dry skin.    . fenofibrate (TRICOR) 145 MG tablet TAKE 1 TABLET BY MOUTH ONCE DAILY FOR CHOLESTEROL. 30 tablet 0  . ferrous sulfate 325 (65 FE) MG EC tablet Take 1 tablet (325 mg total) by mouth daily with breakfast.    . fish oil-omega-3 fatty acids 1000 MG capsule Take 1 capsule (1 g total) by mouth 2 (two) times daily.    [name] glimepiride (AMARYL) 1 MG tablet Take 1 mg by mouth 2 (two) times daily.    . metoprolol succinate (TOPROL-XL) 50 MG 24 hr tablet Take 50 mg by mouth daily.    [name] NITROSTAT 0.4 MG SL tablet PLACE 1 TAB  UNDER TONGUE EVERY 5 MIN IF NEEDED FOR CHEST PAIN. MAY USE 3 TIMES.NO RELIEF CALL 911. 25 tablet 4  . oxyCODONE-acetaminophen (PERCOCET) 10-325 MG per tablet Take 1 tablet by mouth every 4 (four) hours as needed for pain. (Patient taking differently: Take 1 tablet by mouth  every 3 (three) hours as needed for pain. ) 30 tablet 0  . pantoprazole (PROTONIX) 40 MG tablet Take 1 tablet (40 mg total) by mouth 2 (two) times daily before a meal. 60 tablet 2  . potassium chloride SA (K-DUR,KLOR-CON) 20 MEQ tablet Take 20 mEq by mouth daily.      . pregabalin (LYRICA) 50 MG capsule Take one capsule by mouth twice daily for pains (Patient taking differently: Take 50 mg by mouth 3 (three) times daily. ) 60 capsule 5  . ramipril (ALTACE) 2.5 MG capsule Take 1 capsule (2.5 mg total) by mouth 2 (two) times daily. (Patient taking differently: Take 2.5-5 mg by mouth 2 (two) times daily. Takes 96m capsule daily in the morning and takes 2.521mcapsule at bedtime) 60 capsule 3  . ramipril (ALTACE) 5 MG capsule TAKE 1 CAPSULE BY MOUTH EVERY MORNING. 30 capsule 0  . rosuvastatin (CRESTOR) 20 MG tablet TAKE (1) TABLET BY MOUTH AT BEDTIME FOR CHOLESTEROL. 30 tablet 0  . sitaGLIPtin (JANUVIA) 50 MG tablet Take 50 mg by mouth daily.    . vitamin C (ASCORBIC ACID) 500 MG tablet Take 500 mg by mouth daily.    . Marland Kitchenarfarin (COUMADIN) 5 MG tablet Take 1 tablet by mouth daily or as directed by coumadin clinic. (Patient taking differently: Take 2.5-5 mg by mouth. 2.31m39mn Mondays, Wednesdays, and Fridays only. Takes 31mg64m all other day) 30 tablet 1  . clopidogrel (PLAVIX) 75 MG tablet Take 75 mg by mouth daily.    . furosemide (LASIX) 80 MG tablet Take 80 mg by mouth daily.    . isosorbide mononitrate (IMDUR) 60 MG 24 hr tablet Take 1 tablet (60 mg total) by mouth daily. 90 tablet 3  . sucralfate (CARAFATE) 1 GM/10ML suspension Take 10 mLs (1 g total) by mouth 4 (four) times daily -  with meals and at bedtime. 420 mL 0   No current  facility-administered medications for this visit.    Socially, he is married has 4 children 8 grandchildren. He does not routinely exercise he denies alcohol use. He is smoking one half to less than one pack per day. 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     ROS General: Negative; No fevers, chills, or night sweats;  HEENT: Positive for decreased hearing.  No changes in vision, sinus congestion, difficulty swallowing Pulmonary: Negative; No cough, wheezing, shortness of breath, hemoptysis Cardiovascular: Negative; No chest pain, presyncope, syncope, palpitations Positive for history of claudication; recently stable GI: Positive for upper GI bleeding secondary to gastritis GU: Negative; No dysuria, hematuria, or difficulty voiding Musculoskeletal: Negative; no myalgias, joint pain, or weakness Hematologic/Oncology: Negative; no easy bruising, bleeding Endocrine: Positive for diabetes mellitus Neuro: Negative; no changes in balance, headaches Skin: Negative; No rashes or skin lesions Psychiatric: Negative; No behavioral problems, depression Sleep: Negative; No snoring, daytime sleepiness, hypersomnolence, bruxism, restless legs, hypnogognic hallucinations, no cataplexy Other comprehensive 14 point system review is negative.  PE BP 126/60 mmHg  Pulse 83  Ht 5' 10.75" (1.797 m)  Wt 212 lb 1.6 oz (96.208 kg)  BMI 29.79 kg/m2   Wt Readings from Last 3 Encounters:  07/28/15 210 lb (95.255 kg)  07/27/15 212 lb 1.6 oz (96.208 kg)  07/19/15 209 lb (94.802 kg)   General: Alert, oriented, no distress.  HEENT: Normocephalic, atraumatic. Pupils round and reactive; sclera anicteric; no Mouth/Parynx benign; Mallinpatti scale 3 Neck: No JVD, bilateral carotid bruits Lungs: decreased breath  sounds; no wheezing or rales Chest wall: No tenderness to palpation Heart: RRR, s1 s2 normal  2/6 SEM with crisp prosthetic valve sounds no S3 gallop. No rub. No  heaves Abdomen: soft, nontender; no hepatosplenomehaly, BS+; abdominal aorta nontender and not dilated by palpation. Small umbilical protrusion Back: No CVA tenderness Pulses 2+ upper, slightly diminished lower extremities. Extremities: Left below the knee amputation  Neurologic: grossly nonfocal Psychological: Normal affect and mood  ECG (independently read by me): Normal sinus rhythm with first-degree AV block with a PR interval at 230 ms.  Left bundle branch block with repolarization.  Occasional PVC.  April 2015 ECG (independently read by me): sinus rhythm with lateral branch block and repolarization changes.  Occasional PVCs.  First degree AV block with a PR interval of 242 ms   09/20/2013 ECG (independently read by me): Sinus bradycardia 58 beats per minute. First degree block;  left bundle branch   LABS:   BMP Latest Ref Rng 07/28/2015 07/07/2015 07/06/2015  Glucose 65 - 99 mg/dL 199(H) 155(H) 111(H)  BUN 6 - 20 mg/dL 26(H) 23(H) 44(H)  Creatinine 0.61 - 1.24 mg/dL 1.34(H) 1.11 1.07  BUN/Creat Ratio 10 - 22 - - -  Sodium 135 - 145 mmol/L 140 138 141  Potassium 3.5 - 5.1 mmol/L 3.8 4.2 4.2  Chloride 101 - 111 mmol/L 101 110 109  CO2 22 - 32 mmol/L 27 26 24   Calcium 8.9 - 10.3 mg/dL 10.1 9.0 8.9   Hepatic Function Latest Ref Rng 07/28/2015 07/04/2015 06/14/2015  Total Protein 6.5 - 8.1 g/dL 7.7 5.9(L) 5.9(L)  Albumin 3.5 - 5.0 g/dL 4.5 3.4(L) 3.2(L)  AST 15 - 41 U/L 20 19 16   ALT 17 - 63 U/L 15(L) 13(L) 11(L)  Alk Phosphatase 38 - 126 U/L 35(L) 21(L) 18(L)  Total Bilirubin 0.3 - 1.2 mg/dL 0.6 0.5 0.7   CBC Latest Ref Rng 07/28/2015 07/09/2015 07/08/2015  WBC 4.0 - 10.5 K/uL 8.8 5.4 4.9  Hemoglobin 13.0 - 17.0 g/dL 10.2(L) 9.2(L) 9.3(L)  Hematocrit 39.0 - 52.0 % 32.7(L) 28.9(L) 29.2(L)  Platelets 150 - 400 K/uL 298 173 168   Lab Results  Component Value Date   MCV 85.2 07/28/2015   MCV 85.0 07/09/2015   MCV 84.1 07/08/2015   Lab Results  Component Value Date   TSH  0.932 12/28/2014   Lab Results  Component Value Date   HGBA1C 6.2* 05/31/2015   Lipid Panel     Component Value Date/Time   CHOL 123 05/09/2014 0530   CHOL 120 01/07/2013 1058   TRIG 161* 05/09/2014 0530   TRIG 219* 01/07/2013 1058   HDL 29* 05/09/2014 0530   HDL 26* 01/07/2013 1058   CHOLHDL 4.2 05/09/2014 0530   VLDL 32 05/09/2014 0530   LDLCALC 62 05/09/2014 0530   LDLCALC 50 01/07/2013 1058     RADIOLOGY: US Arterial Seg Single  12/24/2012   *RADIOLOGY REPORT*  Clinical Data: Diabetes, nonhealing great toe ulcer, diabetes, hypertension  NONINVASIVE PHYSIOLOGIC VASCULAR STUDY OF BILATERAL LOWER EXTREMITIES  Technique:  Evaluation of both lower extremities were performed at rest, including calculation of ankle-brachial indices with single level Doppler, pressure and pulse volume recording.  Comparison:  None.  Findings:  Right ABI: 1.07  Left ABI: 0.68  Right Lower Extremity: Triphasic right tibial wave form and a biphasic right dorsalis pedis wave form.  No significant pressure gradient.  Normal ABI.  Right toe pressure 86.  Left Lower Extremity: Irregular biphasic left tibial tracing and irregular monophasic  left dorsalis pedis tracing.  48 mmHg pressure gradient in the left ankle compared to the left brachial pressure. This results in an abnormal ABI measuring 0.68.  Left toe pressure could not be obtained.  IMPRESSION: Abnormal left ABI measuring 0.68.  This is indicative of left lower extremity significant vascular disease.  Normal right ABI.   Original Report Authenticated By: Jerilynn Mages. Shick, M.D.      ASSESSMENT AND PLAN:   Mr. Brault is a 74 year old white male who is 25 years status post initial CABG surgery to his RCA at which time he underwent St. Jude aortic valve replacement surgery for  severe aortic valve stenosis . He is 22 years status post CABG surgery to his left coronary system and 10 years status post stenting to his RCA vein graft.  He has peripheral vascular disease  and is status post intervention by Dr. [name].  He has required chronic anticoagulation therapy due to his mechanical aortic prosthesis.  He has experienced several episodes of recent GI bleeds felt to be due to gastritis.  Recent hospitalizations as well as his echo Doppler dated with him in detail.  He has experienced recurrent episodes of chest discomfort since running out of sucralfate over the last week.  He believes his episodes of chest pain may have benefit with sublingual nitroglycerin.  Presently, I am recommending a complete set of blood work be obtained including seen met, CBC, lipid panel, TSH, as well as iron studies.  I have renewed his Carafate. He will continue to take Protonix twice a day.  I am increasing his Imdur to 60 mg daily.  I am scheduling him for Fol[place] study to assess this chest pain, particularly since he is many years since his 2 previous CABG revascularization surgeries.  His blood pressure today is stable on Lamictal.  I milligrams daily, Toprol-XL 50 mg.  He is diabetic on Januvia and Amaryl.  He also has a history of gout and has not had any symptoms on his current dose of allopurinol.  He has COPD due to long-standing tobacco history currently treated with Combivent and albuterol as needed.  He also has hyperlipidemia on Crestor 20 mg and fenofibrate  daily with target LDL less than 70.  I will see him in 6 weeks for follow-up evaluation.  Time spent: 30 minutes  Troy Sine, M.D., [facility] 07/29/2015  12:31 PM

## 2015-08-01 ENCOUNTER — Telehealth (HOSPITAL_COMMUNITY): Payer: Self-pay

## 2015-08-01 ENCOUNTER — Other Ambulatory Visit: Payer: Self-pay | Admitting: *Deleted

## 2015-08-01 NOTE — Patient Outreach (Signed)
Transition of Care week #4  Spoke with patient, patient reports that he has seen Dr. [name] regarding Chest pain, he had an ED visit for chest pain.  Dr. [name] increased his medication for chest pain and wanted him to continue the "stomach medication". Patient daughter [name] is filling medication box for medication management, patient able to confirm medications when [facility] verbally reviewed. Patient states he has only had one bout of chest pain since medication changes and the ED visit, he took 1 NTG with relief. Patient reports no symptoms of GI bleed, still off of Plavix, continuing coumadin.  Plan for outreach visit in January. 08/23/15. Royetta Crochet. Laymond Purser, RN, BSN, [place] [phone]

## 2015-08-01 NOTE — Telephone Encounter (Signed)
Encounter complete. 

## 2015-08-02 DIAGNOSIS — E785 Hyperlipidemia, unspecified: Secondary | ICD-10-CM | POA: Diagnosis not present

## 2015-08-02 DIAGNOSIS — R079 Chest pain, unspecified: Secondary | ICD-10-CM | POA: Diagnosis not present

## 2015-08-02 DIAGNOSIS — I10 Essential (primary) hypertension: Secondary | ICD-10-CM | POA: Diagnosis not present

## 2015-08-02 DIAGNOSIS — D6489 Other specified anemias: Secondary | ICD-10-CM | POA: Diagnosis not present

## 2015-08-03 ENCOUNTER — Telehealth (HOSPITAL_COMMUNITY): Payer: Self-pay

## 2015-08-03 ENCOUNTER — Ambulatory Visit (HOSPITAL_COMMUNITY)
Admission: RE | Admit: 2015-08-03 | Discharge: 2015-08-03 | Disposition: A | Discharge status: Home / Self Care | Payer: Medicare Other | Source: Ambulatory Visit | Attending: Cardiology | Admitting: Cardiology

## 2015-08-03 DIAGNOSIS — Z8249 Family history of ischemic heart disease and other diseases of the circulatory system: Secondary | ICD-10-CM | POA: Diagnosis not present

## 2015-08-03 DIAGNOSIS — Z87891 Personal history of nicotine dependence: Secondary | ICD-10-CM | POA: Diagnosis not present

## 2015-08-03 DIAGNOSIS — R9439 Abnormal result of other cardiovascular function study: Secondary | ICD-10-CM | POA: Diagnosis not present

## 2015-08-03 DIAGNOSIS — I739 Peripheral vascular disease, unspecified: Secondary | ICD-10-CM | POA: Diagnosis not present

## 2015-08-03 DIAGNOSIS — R0609 Other forms of dyspnea: Secondary | ICD-10-CM | POA: Insufficient documentation

## 2015-08-03 DIAGNOSIS — Z6829 Body mass index (BMI) 29.0-29.9, adult: Secondary | ICD-10-CM | POA: Diagnosis not present

## 2015-08-03 DIAGNOSIS — I517 Cardiomegaly: Secondary | ICD-10-CM | POA: Diagnosis not present

## 2015-08-03 DIAGNOSIS — E119 Type 2 diabetes mellitus without complications: Secondary | ICD-10-CM | POA: Insufficient documentation

## 2015-08-03 DIAGNOSIS — I10 Essential (primary) hypertension: Secondary | ICD-10-CM | POA: Diagnosis not present

## 2015-08-03 DIAGNOSIS — I779 Disorder of arteries and arterioles, unspecified: Secondary | ICD-10-CM | POA: Insufficient documentation

## 2015-08-03 DIAGNOSIS — R002 Palpitations: Secondary | ICD-10-CM | POA: Diagnosis not present

## 2015-08-03 DIAGNOSIS — I447 Left bundle-branch block, unspecified: Secondary | ICD-10-CM | POA: Diagnosis not present

## 2015-08-03 DIAGNOSIS — R079 Chest pain, unspecified: Secondary | ICD-10-CM

## 2015-08-03 DIAGNOSIS — E669 Obesity, unspecified: Secondary | ICD-10-CM | POA: Insufficient documentation

## 2015-08-03 LAB — MYOCARDIAL PERFUSION IMAGING
CHL CUP NUCLEAR SSS: 11
CSEPPHR: 85 {beats}/min
Rest HR: 67 {beats}/min
SDS: 2
SRS: 11
TID: 1.08

## 2015-08-03 MED ORDER — TECHNETIUM TC 99M SESTAMIBI GENERIC - CARDIOLITE
32.6000 | Freq: Once | INTRAVENOUS | Status: AC | PRN
  Administered 2015-08-03: 32.6 via INTRAVENOUS

## 2015-08-03 MED ORDER — TECHNETIUM TC 99M SESTAMIBI GENERIC - CARDIOLITE
10.9000 | Freq: Once | INTRAVENOUS | Status: AC | PRN
  Administered 2015-08-03: 10.9 via INTRAVENOUS

## 2015-08-03 MED ORDER — REGADENOSON 0.4 MG/5ML IV SOLN
0.4000 mg | Freq: Once | INTRAVENOUS | Status: AC
  Administered 2015-08-03: 0.4 mg via INTRAVENOUS

## 2015-08-03 NOTE — Telephone Encounter (Signed)
Encounter complete. 

## 2015-08-07 ENCOUNTER — Encounter (HOSPITAL_COMMUNITY): Payer: Self-pay | Admitting: Emergency Medicine

## 2015-08-07 ENCOUNTER — Inpatient Hospital Stay (HOSPITAL_COMMUNITY)
Admission: EM | Admit: 2015-08-07 | Discharge: 2015-08-10 | DRG: 281 | Disposition: A | Discharge status: Home / Self Care | Payer: Medicare Other | Attending: Internal Medicine | Admitting: Internal Medicine

## 2015-08-07 ENCOUNTER — Emergency Department (HOSPITAL_COMMUNITY): Payer: Medicare Other

## 2015-08-07 DIAGNOSIS — Z89519 Acquired absence of unspecified leg below knee: Secondary | ICD-10-CM

## 2015-08-07 DIAGNOSIS — G4730 Sleep apnea, unspecified: Secondary | ICD-10-CM | POA: Diagnosis not present

## 2015-08-07 DIAGNOSIS — G629 Polyneuropathy, unspecified: Secondary | ICD-10-CM | POA: Diagnosis not present

## 2015-08-07 DIAGNOSIS — E785 Hyperlipidemia, unspecified: Secondary | ICD-10-CM | POA: Diagnosis not present

## 2015-08-07 DIAGNOSIS — I252 Old myocardial infarction: Secondary | ICD-10-CM

## 2015-08-07 DIAGNOSIS — Z87891 Personal history of nicotine dependence: Secondary | ICD-10-CM

## 2015-08-07 DIAGNOSIS — E1122 Type 2 diabetes mellitus with diabetic chronic kidney disease: Secondary | ICD-10-CM | POA: Diagnosis present

## 2015-08-07 DIAGNOSIS — Z955 Presence of coronary angioplasty implant and graft: Secondary | ICD-10-CM | POA: Diagnosis not present

## 2015-08-07 DIAGNOSIS — I214 Non-ST elevation (NSTEMI) myocardial infarction: Secondary | ICD-10-CM | POA: Diagnosis not present

## 2015-08-07 DIAGNOSIS — Z952 Presence of prosthetic heart valve: Secondary | ICD-10-CM | POA: Diagnosis not present

## 2015-08-07 DIAGNOSIS — I2510 Atherosclerotic heart disease of native coronary artery without angina pectoris: Secondary | ICD-10-CM | POA: Diagnosis not present

## 2015-08-07 DIAGNOSIS — I255 Ischemic cardiomyopathy: Secondary | ICD-10-CM | POA: Diagnosis present

## 2015-08-07 DIAGNOSIS — K219 Gastro-esophageal reflux disease without esophagitis: Secondary | ICD-10-CM | POA: Diagnosis present

## 2015-08-07 DIAGNOSIS — Z951 Presence of aortocoronary bypass graft: Secondary | ICD-10-CM | POA: Diagnosis not present

## 2015-08-07 DIAGNOSIS — I25119 Atherosclerotic heart disease of native coronary artery with unspecified angina pectoris: Secondary | ICD-10-CM | POA: Diagnosis present

## 2015-08-07 DIAGNOSIS — I130 Hypertensive heart and chronic kidney disease with heart failure and stage 1 through stage 4 chronic kidney disease, or unspecified chronic kidney disease: Secondary | ICD-10-CM | POA: Diagnosis present

## 2015-08-07 DIAGNOSIS — Z681 Body mass index (BMI) 19 or less, adult: Secondary | ICD-10-CM | POA: Diagnosis not present

## 2015-08-07 DIAGNOSIS — L97529 Non-pressure chronic ulcer of other part of left foot with unspecified severity: Secondary | ICD-10-CM | POA: Diagnosis present

## 2015-08-07 DIAGNOSIS — K2960 Other gastritis without bleeding: Secondary | ICD-10-CM | POA: Diagnosis present

## 2015-08-07 DIAGNOSIS — Z89512 Acquired absence of left leg below knee: Secondary | ICD-10-CM | POA: Diagnosis not present

## 2015-08-07 DIAGNOSIS — I10 Essential (primary) hypertension: Secondary | ICD-10-CM | POA: Diagnosis not present

## 2015-08-07 DIAGNOSIS — I739 Peripheral vascular disease, unspecified: Secondary | ICD-10-CM | POA: Diagnosis present

## 2015-08-07 DIAGNOSIS — N181 Chronic kidney disease, stage 1: Secondary | ICD-10-CM | POA: Diagnosis present

## 2015-08-07 DIAGNOSIS — Z7901 Long term (current) use of anticoagulants: Secondary | ICD-10-CM | POA: Diagnosis not present

## 2015-08-07 DIAGNOSIS — IMO0002 Reserved for concepts with insufficient information to code with codable children: Secondary | ICD-10-CM

## 2015-08-07 DIAGNOSIS — Z720 Tobacco use: Secondary | ICD-10-CM | POA: Diagnosis present

## 2015-08-07 DIAGNOSIS — J449 Chronic obstructive pulmonary disease, unspecified: Secondary | ICD-10-CM | POA: Diagnosis present

## 2015-08-07 DIAGNOSIS — R079 Chest pain, unspecified: Secondary | ICD-10-CM | POA: Diagnosis not present

## 2015-08-07 DIAGNOSIS — Z1389 Encounter for screening for other disorder: Secondary | ICD-10-CM | POA: Diagnosis not present

## 2015-08-07 DIAGNOSIS — Z954 Presence of other heart-valve replacement: Secondary | ICD-10-CM | POA: Diagnosis not present

## 2015-08-07 DIAGNOSIS — I5022 Chronic systolic (congestive) heart failure: Secondary | ICD-10-CM | POA: Diagnosis present

## 2015-08-07 LAB — CBC
HCT: 29.5 % — ABNORMAL LOW (ref 39.0–52.0)
Hemoglobin: 9 g/dL — ABNORMAL LOW (ref 13.0–17.0)
MCH: 25.4 pg — AB (ref 26.0–34.0)
MCHC: 30.5 g/dL (ref 30.0–36.0)
MCV: 83.3 fL (ref 78.0–100.0)
PLATELETS: 260 10*3/uL (ref 150–400)
RBC: 3.54 MIL/uL — AB (ref 4.22–5.81)
RDW: 18.1 % — ABNORMAL HIGH (ref 11.5–15.5)
WBC: 7.2 10*3/uL (ref 4.0–10.5)

## 2015-08-07 LAB — TROPONIN I
TROPONIN I: 0.06 ng/mL — AB (ref <0.031)
TROPONIN I: 0.24 ng/mL — AB (ref <0.031)
TROPONIN I: 1.11 ng/mL — AB (ref <0.031)
TROPONIN I: 1.25 ng/mL — AB (ref <0.031)
Troponin I: 1.04 ng/mL (ref <0.031)

## 2015-08-07 LAB — BASIC METABOLIC PANEL
Anion gap: 8 (ref 5–15)
BUN: 27 mg/dL — ABNORMAL HIGH (ref 6–20)
CALCIUM: 9.9 mg/dL (ref 8.9–10.3)
CHLORIDE: 107 mmol/L (ref 101–111)
CO2: 25 mmol/L (ref 22–32)
CREATININE: 1.36 mg/dL — AB (ref 0.61–1.24)
GFR calc Af Amer: 58 mL/min — ABNORMAL LOW (ref >60)
GFR calc non Af Amer: 50 mL/min — ABNORMAL LOW (ref >60)
GLUCOSE: 300 mg/dL — AB (ref 65–99)
Potassium: 3.5 mmol/L (ref 3.5–5.1)
Sodium: 140 mmol/L (ref 135–145)

## 2015-08-07 LAB — PROTIME-INR
INR: 2.77 — ABNORMAL HIGH (ref 0.00–1.49)
Prothrombin Time: 28.8 seconds — ABNORMAL HIGH (ref 11.6–15.2)

## 2015-08-07 LAB — GLUCOSE, CAPILLARY: Glucose-Capillary: 259 mg/dL — ABNORMAL HIGH (ref 65–99)

## 2015-08-07 MED ORDER — POTASSIUM CHLORIDE CRYS ER 20 MEQ PO TBCR
20.0000 meq | EXTENDED_RELEASE_TABLET | Freq: Every day | ORAL | Status: DC
  Administered 2015-08-07 – 2015-08-10 (×4): 20 meq via ORAL
  Filled 2015-08-07 (×4): qty 1

## 2015-08-07 MED ORDER — RAMIPRIL 1.25 MG PO CAPS
2.5000 mg | ORAL_CAPSULE | Freq: Every day | ORAL | Status: DC
  Administered 2015-08-08 – 2015-08-09 (×2): 2.5 mg via ORAL
  Filled 2015-08-07 (×3): qty 2

## 2015-08-07 MED ORDER — OMEGA-3-ACID ETHYL ESTERS 1 G PO CAPS
1.0000 g | ORAL_CAPSULE | Freq: Two times a day (BID) | ORAL | Status: DC
  Administered 2015-08-07 – 2015-08-10 (×6): 1 g via ORAL
  Filled 2015-08-07 (×6): qty 1

## 2015-08-07 MED ORDER — INSULIN ASPART 100 UNIT/ML ~~LOC~~ SOLN
3.0000 [IU] | Freq: Three times a day (TID) | SUBCUTANEOUS | Status: DC
Start: 2015-08-08 — End: 2015-08-10
  Administered 2015-08-08 – 2015-08-10 (×8): 3 [IU] via SUBCUTANEOUS

## 2015-08-07 MED ORDER — ONDANSETRON HCL 4 MG/2ML IJ SOLN: 4.0000 mg | Freq: Three times a day (TID) | INTRAMUSCULAR | Status: DC | PRN

## 2015-08-07 MED ORDER — FERROUS SULFATE 325 (65 FE) MG PO TABS
325.0000 mg | ORAL_TABLET | Freq: Every day | ORAL | Status: DC
  Administered 2015-08-08 – 2015-08-10 (×3): 325 mg via ORAL
  Filled 2015-08-07 (×4): qty 1

## 2015-08-07 MED ORDER — INFLUENZA VAC SPLIT QUAD 0.5 ML IM SUSY
0.5000 mL | PREFILLED_SYRINGE | INTRAMUSCULAR | Status: DC
Start: 2015-08-08 — End: 2015-08-10
  Filled 2015-08-07: qty 0.5

## 2015-08-07 MED ORDER — NITROGLYCERIN 0.4 MG SL SUBL
0.4000 mg | SUBLINGUAL_TABLET | SUBLINGUAL | Status: DC | PRN
  Filled 2015-08-07: qty 1

## 2015-08-07 MED ORDER — FENOFIBRATE 160 MG PO TABS
160.0000 mg | ORAL_TABLET | Freq: Every day | ORAL | Status: DC
  Administered 2015-08-07 – 2015-08-10 (×4): 160 mg via ORAL
  Filled 2015-08-07 (×4): qty 1

## 2015-08-07 MED ORDER — IPRATROPIUM-ALBUTEROL 0.5-2.5 (3) MG/3ML IN SOLN
3.0000 mL | Freq: Four times a day (QID) | RESPIRATORY_TRACT | Status: DC
  Administered 2015-08-07: 3 mL via RESPIRATORY_TRACT
  Filled 2015-08-07: qty 3

## 2015-08-07 MED ORDER — SODIUM CHLORIDE 0.9 % IV SOLN
INTRAVENOUS | Status: AC
  Administered 2015-08-07: 20:00:00 via INTRAVENOUS

## 2015-08-07 MED ORDER — IPRATROPIUM-ALBUTEROL 18-103 MCG/ACT IN AERO: 1.0000 | INHALATION_SPRAY | Freq: Four times a day (QID) | RESPIRATORY_TRACT | Status: DC

## 2015-08-07 MED ORDER — PNEUMOCOCCAL VAC POLYVALENT 25 MCG/0.5ML IJ INJ
0.5000 mL | INJECTION | INTRAMUSCULAR | Status: DC
  Filled 2015-08-07: qty 0.5

## 2015-08-07 MED ORDER — ROSUVASTATIN CALCIUM 20 MG PO TABS
20.0000 mg | ORAL_TABLET | Freq: Every day | ORAL | Status: DC
  Administered 2015-08-07 – 2015-08-09 (×3): 20 mg via ORAL
  Filled 2015-08-07 (×3): qty 1

## 2015-08-07 MED ORDER — ONDANSETRON HCL 4 MG/2ML IJ SOLN: 4.0000 mg | Freq: Four times a day (QID) | INTRAMUSCULAR | Status: DC | PRN

## 2015-08-07 MED ORDER — ISOSORBIDE MONONITRATE ER 60 MG PO TB24
60.0000 mg | ORAL_TABLET | Freq: Every day | ORAL | Status: DC
  Administered 2015-08-07 – 2015-08-10 (×4): 60 mg via ORAL
  Filled 2015-08-07 (×4): qty 1

## 2015-08-07 MED ORDER — RAMIPRIL 1.25 MG PO CAPS: 2.5000 mg | ORAL_CAPSULE | Freq: Two times a day (BID) | ORAL | Status: DC

## 2015-08-07 MED ORDER — GLIMEPIRIDE 2 MG PO TABS
1.0000 mg | ORAL_TABLET | Freq: Two times a day (BID) | ORAL | Status: DC
  Administered 2015-08-07 – 2015-08-10 (×6): 1 mg via ORAL
  Filled 2015-08-07 (×6): qty 1

## 2015-08-07 MED ORDER — GI COCKTAIL ~~LOC~~: 30.0000 mL | Freq: Four times a day (QID) | ORAL | Status: DC | PRN

## 2015-08-07 MED ORDER — ALLOPURINOL 300 MG PO TABS
300.0000 mg | ORAL_TABLET | Freq: Every day | ORAL | Status: DC
  Administered 2015-08-07 – 2015-08-10 (×4): 300 mg via ORAL
  Filled 2015-08-07 (×4): qty 1

## 2015-08-07 MED ORDER — PANTOPRAZOLE SODIUM 40 MG PO TBEC
40.0000 mg | DELAYED_RELEASE_TABLET | Freq: Two times a day (BID) | ORAL | Status: DC
  Administered 2015-08-07 – 2015-08-10 (×6): 40 mg via ORAL
  Filled 2015-08-07 (×6): qty 1

## 2015-08-07 MED ORDER — SUCRALFATE 1 GM/10ML PO SUSP
1.0000 g | Freq: Three times a day (TID) | ORAL | Status: DC
Start: 2015-08-07 — End: 2015-08-10
  Administered 2015-08-07 – 2015-08-10 (×11): 1 g via ORAL
  Filled 2015-08-07 (×11): qty 10

## 2015-08-07 MED ORDER — LINAGLIPTIN 5 MG PO TABS
5.0000 mg | ORAL_TABLET | Freq: Every day | ORAL | Status: DC
  Administered 2015-08-07 – 2015-08-10 (×4): 5 mg via ORAL
  Filled 2015-08-07 (×4): qty 1

## 2015-08-07 MED ORDER — RAMIPRIL 5 MG PO CAPS
5.0000 mg | ORAL_CAPSULE | Freq: Every day | ORAL | Status: DC
  Administered 2015-08-07 – 2015-08-10 (×4): 5 mg via ORAL
  Filled 2015-08-07 (×4): qty 1

## 2015-08-07 MED ORDER — PREGABALIN 50 MG PO CAPS
50.0000 mg | ORAL_CAPSULE | Freq: Three times a day (TID) | ORAL | Status: DC
  Administered 2015-08-07 – 2015-08-10 (×8): 50 mg via ORAL
  Filled 2015-08-07 (×8): qty 1

## 2015-08-07 MED ORDER — INSULIN ASPART 100 UNIT/ML ~~LOC~~ SOLN
0.0000 [IU] | Freq: Three times a day (TID) | SUBCUTANEOUS | Status: DC
  Administered 2015-08-08: 5 [IU] via SUBCUTANEOUS
  Administered 2015-08-08 (×2): 2 [IU] via SUBCUTANEOUS
  Administered 2015-08-09: 7 [IU] via SUBCUTANEOUS
  Administered 2015-08-09: 2 [IU] via SUBCUTANEOUS
  Administered 2015-08-09: 1 [IU] via SUBCUTANEOUS
  Administered 2015-08-10: 3 [IU] via SUBCUTANEOUS
  Administered 2015-08-10: 1 [IU] via SUBCUTANEOUS

## 2015-08-07 MED ORDER — INSULIN ASPART 100 UNIT/ML ~~LOC~~ SOLN
0.0000 [IU] | Freq: Every day | SUBCUTANEOUS | Status: DC
  Administered 2015-08-07: 3 [IU] via SUBCUTANEOUS
  Administered 2015-08-08: 2 [IU] via SUBCUTANEOUS

## 2015-08-07 MED ORDER — ACETAMINOPHEN 325 MG PO TABS
650.0000 mg | ORAL_TABLET | ORAL | Status: DC | PRN
  Filled 2015-08-07: qty 2

## 2015-08-07 MED ORDER — METOPROLOL SUCCINATE ER 50 MG PO TB24
50.0000 mg | ORAL_TABLET | Freq: Every day | ORAL | Status: DC
  Administered 2015-08-07 – 2015-08-10 (×4): 50 mg via ORAL
  Filled 2015-08-07 (×4): qty 1

## 2015-08-07 MED ORDER — FUROSEMIDE 80 MG PO TABS
80.0000 mg | ORAL_TABLET | Freq: Every day | ORAL | Status: DC
  Administered 2015-08-07 – 2015-08-10 (×4): 80 mg via ORAL
  Filled 2015-08-07 (×4): qty 1

## 2015-08-07 NOTE — Progress Notes (Signed)
Notified [employer] of pt increasing troponin. It is now 1.25. There are no new orders at this time. Will continue to monitor

## 2015-08-07 NOTE — ED Notes (Signed)
PT stated he was at the MD office this am and stated he started having left sided chest pain that radiated down his left arm. EMS reported office gave nitro x2 and they repeated with one more nitro in route and denies any aspirin. PT states relief from nitroglycerin medications and denies any pain at this time.

## 2015-08-07 NOTE — ED Provider Notes (Signed)
CSN: [id number]     Arrival date & time 08/07/15  0750 History  By signing my name below, I, Terressa Koyanagi, attest that this documentation has been prepared under the direction and in the presence of [place]*. Electronically Signed: Terressa Koyanagi, ED Scribe. 08/07/2015. 10:18 AM.  Chief Complaint  Patient presents with  . Chest Pain   The history is provided by the patient. No language interpreter was used.   PCP: [name], MD HPI Comments: Tanner Pena is a 74 y.o. male, with PMHx noted below including coronary artery bypass graft (1994), valve replacement, COPD, essential hypertension, CAD, chronic systolic heart failure, daily tobacco use (chewing tobacco) and CHF, who presents to the Emergency Department complaining of atraumatic, recurrent episode of chest pain onset this morning at 6:00am lasting approximately 30 minutes. Pt describes the pain as a pressure/tightness in the chest. Pt reports taking 2 nitroglycerine which resolved his chest pain. During exam pt reports he has no pain. Pt notes today's episode is consistent with prior episodes. Pt reports he exercises regularly. Pt confirms mild, intermittent dry cough, however, denies fever, n/v/d. Pt also reports daily blood thinner use.    Past Medical History  Diagnosis Date  . Type 2 diabetes mellitus ([place]) 2007  . Essential hypertension   . Gout   . Hypercholesteremia   . Peripheral vascular disease ([place])     Lower extremity PCI/stenting  . COPD (chronic obstructive pulmonary disease) ([place])   . S/P aortic valve replacement 1990    a. St. Jude  . Chronic back pain   . Dysphagia   . Neuromuscular disorder ([place])   . Peripheral neuropathy ([place])   . Critical lower limb ischemia   . CAD (coronary artery disease)     a. 05/13/14 [name] s/p overlapping DESx2 to SVG to RCA. b.  s/p CABG '90 with redo '94 & stent to RCA SVG in 2005  . Chronic toe ulcer ([place])     a. Left foot  . Chronic systolic heart  failure ([place])   . History of kidney stones   . GERD (gastroesophageal reflux disease)   . Arthritis   . Sleep apnea   . Myocardial infarction ([place])   . GI bleeding 05/31/2015    Source not identified.  . Acute blood loss anemia 02/2015 & 05/2015    Hemoglobin 5.5 on 05/31/15; status post transfusion.  . CHF (congestive heart failure) ([place])   . Asthma    Past Surgical History  Procedure Laterality Date  . Aortic valve replacement  1990    St. Jude  . Rotator cuff repair      right  . Cataract extraction      bilateral  . Coronary stent placement  2005    RCA vein graft A 3.0x13.0 TAXUS stent was then placed int he vessel a Viva 3.0x4.0 (perfusion balloon was made ready it was placed through the entire lenght of the stent  . Peripheral vascular procedures lower extremities      right external iliac  artery PTA and stenting as well as bilateral SFA intervention remotely. Repeat procedures in 2011 bilaterally  . Coronary artery bypass graft  1994    6 vessels  . [name]  06/13/2011    Procedure: Venia Minks DILATION;  Surgeon: [name], MD;  Location: [facility];  Service: Endoscopy;  Laterality: N/A;  Dilated to 56.   . Angioplasty illiac artery    . Back surgery  3474,2595  2  . Eye surgery    . Amputation Left 07/13/2014    Procedure: Transmetatarsal Amputation;  Surgeon: [name], MD;  Location: [place];  Service: Orthopedics;  Laterality: Left;  . Lower extremity angiogram N/A 02/15/2013    Procedure: LOWER EXTREMITY ANGIOGRAM;  Surgeon: [name], MD;  Location: [facility] CATH LAB;  Service: Cardiovascular;  Laterality: N/A;  . Left heart catheterization with coronary angiogram N/A 05/11/2014    Procedure: LEFT HEART CATHETERIZATION WITH CORONARY ANGIOGRAM;  Surgeon: [name], MD;  Location: [facility] CATH LAB;  Service: Cardiovascular;  Laterality: N/A;  . Percutaneous coronary stent intervention (pci-s) N/A 05/13/2014    Procedure: PERCUTANEOUS CORONARY  STENT INTERVENTION (PCI-S);  Surgeon: [name], MD;  Location: [facility] CATH LAB;  Service: Cardiovascular;  Laterality: N/A;  . Lower extremity angiogram N/A 06/06/2014    Procedure: LOWER EXTREMITY ANGIOGRAM;  Surgeon: [name], MD;  Location: [facility] CATH LAB;  Service: Cardiovascular;  Laterality: N/A;  . Amputation Left 08/20/2014    Procedure: Revision Transmetatarsal Amputation versus Below Knee Amputation;  Surgeon: [name], MD;  Location: [place];  Service: Orthopedics;  Laterality: Left;  . Stump revision Left 09/23/2014    Procedure: Revision Left Below Knee Amputation;  Surgeon: [name], MD;  Location: [place];  Service: Orthopedics;  Laterality: Left;  . Stump revision Left 10/13/2014    Procedure: REVISION LEFT BELOW KNEE AMPUTATION STUMP;  Surgeon: [name], MD;  Location: [facility];  Service: Orthopedics;  Laterality: Left;  . Esophagogastroduodenoscopy N/A 02/09/2015    DR. Schooler: Normal EGD  . Esophagogastroduodenoscopy  05/2011    Dr. [name]: s/p esophageal dilation, antral erosions/nodularity with benign biopsies  . Colonoscopy  05/2011    Dr. [name]: benign rectal polyp, left sided tics, ascending colonic ulcers (path c/w ischemia)  . Colonoscopy with propofol N/A 06/01/2015    RMR: normal appearing rectal mucosa. Scattered left-sided diverticula. the remainder of the colonic mucosa appeared normal. the distal 5 cm of terminal ileal mucosa also appeared normal. retroflexion was performed.   Freda Munro capsule study N/A 06/08/2015    Procedure: GIVENS CAPSULE STUDY;  Surgeon: [name], MD;  Location: [facility];  Service: Endoscopy;  Laterality: N/A;  0700   . Esophagogastroduodenoscopy (egd) with propofol N/A 06/20/2015    Procedure: ESOPHAGOGASTRODUODENOSCOPY (EGD) WITH PROPOFOL;  Surgeon: Danie Binder, MD;  Location: [facility];  Service: Endoscopy;  Laterality: N/A;  . Esophageal biopsy N/A 06/20/2015    Procedure: BIOPSY;  Surgeon: Danie Binder, MD;  Location: [facility];  Service: Endoscopy;  Laterality: N/A;   Family History  Problem Relation Age of Onset  . Colon cancer Neg Hx   . Liver disease Neg Hx   . Diabetes Father   . Heart failure Other    Social History  Substance Use Topics  . Smoking status: Former Smoker -- 1.00 packs/day for 55 years    Types: Cigarettes    Start date: 08/19/1953    Quit date: 05/07/2014  . Smokeless tobacco: Current User    Types: Chew    Last Attempt to Quit: 08/20/1995     Comment: Has quit on 3 occasions. Counseling given today 5-10 minutes   I am more than likely going to quit "  . Alcohol Use: No     Comment: Socially, sometimes 12 ounce beer daily, may go month without/no whiskey    Review of Systems  Constitutional: Negative for fever.  Respiratory: Positive for cough.   Cardiovascular: Positive for chest pain.  Gastrointestinal: Negative for nausea, vomiting and diarrhea.  Hematological: Bruises/bleeds easily.  All other systems reviewed and are negative.  Allergies  Review of patient's allergies indicates no known allergies.  Home Medications   Prior to Admission medications   Medication Sig Start Date End Date Taking? Authorizing Provider  albuterol (PROVENTIL) 4 MG tablet Take 4 mg by mouth 3 (three) times daily. 01/20/15  Yes Historical Provider, MD  albuterol-ipratropium (COMBIVENT) 18-103 MCG/ACT inhaler Inhale 1 puff into the lungs 4 (four) times daily. Coughing/ Shortness of Breath   Yes Historical Provider, MD  allopurinol (ZYLOPRIM) 300 MG tablet Take 300 mg by mouth daily.   Yes Historical Provider, MD  cholecalciferol (VITAMIN D) 1000 UNITS tablet Take 2,000 Units by mouth daily.   Yes Historical Provider, MD  clopidogrel (PLAVIX) 75 MG tablet Take 75 mg by mouth daily. 07/21/15  Yes Historical Provider, MD  Emollient (EUCERIN) lotion Apply 10 mLs topically as needed for dry skin.   Yes Historical Provider, MD  fenofibrate (TRICOR) 145 MG tablet TAKE 1 TABLET  BY MOUTH ONCE DAILY FOR CHOLESTEROL. 07/21/15  Yes [name], MD  ferrous sulfate 325 (65 FE) MG EC tablet Take 1 tablet (325 mg total) by mouth daily with breakfast. 06/02/15  Yes Rexene Alberts, MD  fish oil-omega-3 fatty acids 1000 MG capsule Take 1 capsule (1 g total) by mouth 2 (two) times daily. 01/22/13  Yes Kristin L Alvstad, RPH-CPP  furosemide (LASIX) 80 MG tablet Take 80 mg by mouth daily. 07/21/15  Yes Historical Provider, MD  glimepiride (AMARYL) 1 MG tablet Take 1 mg by mouth 2 (two) times daily.   Yes Historical Provider, MD  isosorbide mononitrate (IMDUR) 60 MG 24 hr tablet Take 1 tablet (60 mg total) by mouth daily. 07/27/15  Yes Troy Sine, MD  metoprolol succinate (TOPROL-XL) 50 MG 24 hr tablet Take 50 mg by mouth daily. 06/22/15  Yes Historical Provider, MD  NITROSTAT 0.4 MG SL tablet PLACE 1 TAB UNDER TONGUE EVERY 5 MIN IF NEEDED FOR CHEST PAIN. MAY USE 3 TIMES.NO RELIEF CALL 911. 07/20/15  Yes Eileen Stanford, PA-C  oxyCODONE-acetaminophen (PERCOCET) 10-325 MG per tablet Take 1 tablet by mouth every 4 (four) hours as needed for pain. Patient taking differently: Take 1 tablet by mouth every 3 (three) hours as needed for pain.  09/23/14  Yes [name], MD  pantoprazole (PROTONIX) 40 MG tablet Take 1 tablet (40 mg total) by mouth 2 (two) times daily before a meal. 06/23/15  Yes Estela Leonie Green, MD  potassium chloride SA (K-DUR,KLOR-CON) 20 MEQ tablet Take 20 mEq by mouth daily.     Yes Historical Provider, MD  pregabalin (LYRICA) 50 MG capsule Take one capsule by mouth twice daily for pains Patient taking differently: Take 50 mg by mouth 3 (three) times daily.  08/23/14  Yes Mahima Pandey, MD  ramipril (ALTACE) 2.5 MG capsule Take 1 capsule (2.5 mg total) by mouth 2 (two) times daily. Patient taking differently: Take 2.5-5 mg by mouth 2 (two) times daily. Takes '5mg'$  capsule daily in the morning and takes 2.'5mg'$  capsule at bedtime 06/02/15  Yes Rexene Alberts, MD   rosuvastatin (CRESTOR) 20 MG tablet TAKE (1) TABLET BY MOUTH AT BEDTIME FOR CHOLESTEROL. 07/21/15  Yes [name], MD  sitaGLIPtin (JANUVIA) 50 MG tablet Take 50 mg by mouth daily.   Yes Historical Provider, MD  sucralfate (CARAFATE) 1  GM/10ML suspension Take 10 mLs (1 g total) by mouth 4 (four) times daily -  with meals and at bedtime. 07/28/15  Yes Carmin Muskrat, MD  vitamin C (ASCORBIC ACID) 500 MG tablet Take 500 mg by mouth daily.   Yes Historical Provider, MD  warfarin (COUMADIN) 5 MG tablet Take 1 tablet by mouth daily or as directed by coumadin clinic. Patient taking differently: Take 2.5-5 mg by mouth. 2.'5mg'$  on Mondays and Fridays only. Takes '5mg'$  on all other day 06/02/15  Yes Rexene Alberts, MD   Triage Vitals: BP 163/87 mmHg  Pulse 105  Temp(Src) 98.3 F (36.8 C) (Oral)  Resp 20  Ht 5' 10.5" (1.791 m)  Wt 212 lb (96.163 kg)  BMI 29.98 kg/m2  SpO2 89% Physical Exam  Constitutional: He appears well-developed and well-nourished. No distress.  HENT:  Head: Normocephalic and atraumatic.  Mouth/Throat: Oropharynx is clear and moist. No oropharyngeal exudate.  Eyes: Conjunctivae and EOM are normal. Pupils are equal, round, and reactive to light. Right eye exhibits no discharge. Left eye exhibits no discharge. No scleral icterus.  Neck: Normal range of motion. Neck supple. No JVD present. No thyromegaly present.  Cardiovascular: Normal rate and intact distal pulses.  Exam reveals no gallop and no friction rub.   Murmur (systolic murmur) heard. Pulmonary/Chest: Effort normal and breath sounds normal. No respiratory distress. He has no wheezes. He has no rales.  Abdominal: Soft. Bowel sounds are normal. He exhibits no distension and no mass. There is no tenderness.  Musculoskeletal: Normal range of motion. He exhibits no edema or tenderness.  LLE amputated, prosthetic in place.   Lymphadenopathy:    He has no cervical adenopathy.  Neurological: He is alert. Coordination normal.   Skin: Skin is warm and dry. No rash noted. No erythema.  Psychiatric: He has a normal mood and affect. His behavior is normal.  Nursing note and vitals reviewed.   ED Course  Procedures (including critical care time) DIAGNOSTIC STUDIES: Oxygen Saturation is 89% on ra, low by my interpretation.    COORDINATION OF CARE: 8:10 AM: Discussed treatment plan which includes EKG, chest xray, and labs with pt at bedside; patient verbalizes understanding and agrees with treatment plan.  Labs Review Labs Reviewed  BASIC METABOLIC PANEL - Abnormal; Notable for the following:    Glucose, Bld 300 (*)    BUN 27 (*)    Creatinine, Ser 1.36 (*)    GFR calc non Af Amer 50 (*)    GFR calc Af Amer 58 (*)    All other components within normal limits  CBC - Abnormal; Notable for the following:    RBC 3.54 (*)    Hemoglobin 9.0 (*)    HCT 29.5 (*)    MCH 25.4 (*)    RDW 18.1 (*)    All other components within normal limits  TROPONIN I - Abnormal; Notable for the following:    Troponin I 0.06 (*)    All other components within normal limits  PROTIME-INR - Abnormal; Notable for the following:    Prothrombin Time 28.8 (*)    INR 2.77 (*)    All other components within normal limits  TROPONIN I - Abnormal; Notable for the following:    Troponin I 0.24 (*)    All other components within normal limits    Imaging Review Dg Chest 2 View  08/07/2015  CLINICAL DATA:  Chest pain. EXAM: CHEST  2 VIEW COMPARISON:  07/28/2015.  05/12/2014 .  08/10/2013. FINDINGS:  Prior CABG. Cardiomegaly. Pulmonary vascularity normal. Stable basilar pleural parenchymal thickening on the left noted consistent with scarring. IMPRESSION: 1. Cardiomegaly. Stable cardiomegaly. No pulmonary venous congestion. 2. Stable left base pleural parenchymal thickening consistent with scarring Electronically Signed   By: Marcello Moores  Register   On: 08/07/2015 08:36   I have personally reviewed and evaluated these images and lab results as part  of my medical decision-making.   EKG Interpretation   Date/Time:  Monday August 07 2015 07:53:53 EST Ventricular Rate:  102 PR Interval:  178 QRS Duration: 150 QT Interval:  368 QTC Calculation: 479 R Axis:   11 Text Interpretation:  Sinus or ectopic atrial tachycardia Multiform  ventricular premature complexes Left bundle branch block Since last  tracing ST abnormality now present in lead 3, otherwise ectopy present  Confirmed by Perley Arthurs  MD, Kwan Shellhammer ([id number]) on 08/07/2015 7:57:56 AM      MDM   Final diagnoses:  NSTEMI (non-ST elevated myocardial infarction) [place])    Records reviewed - pt has long hx of ischemic disease and PAD - has seen cardiology last month after admission to the hospital - had stress 4 days ago and this was read as low risk study - he has an abnormal lead - 3 - there is slight concordant elevation - no STEMI criteria - d/w Dr. Domenic Polite at 8:30 AM who suggests getting CE's.  Pt is sx free at this time - will plan on 2 sets.  Second set of enzymes is elevated - pt is having mild NSTEMI - d/w Dr. Domenic Polite again who agrees with medical admission - trend troponins and if he continues to rise, admitting service can consult cardiology.   D/w Dr. Jerilee Hoh who will admit the pt to the hospital  CRITICAL CARE Performed by: Johnna Acosta Total critical care time: 35 minutes Critical care time was exclusive of separately billable procedures and treating other patients. Critical care was necessary to treat or prevent imminent or life-threatening deterioration. Critical care was time spent personally by me on the following activities: development of treatment plan with patient and/or surrogate as well as nursing, discussions with consultants, evaluation of patient's response to treatment, examination of patient, obtaining history from patient or surrogate, ordering and performing treatments and interventions, ordering and review of laboratory studies, ordering and review  of radiographic studies, pulse oximetry and re-evaluation of patient's condition.   I personally performed the services described in this documentation, which was scribed in my presence. The recorded information has been reviewed and is accurate.        Noemi Chapel, MD 08/07/15 [phone]

## 2015-08-07 NOTE — H&P (Signed)
[facility]          History and Physical    PCP:   [name], MD   EDP: Noemi Chapel, M.D.  Chief Complaint:  Chest discomfort  HPI: This is a 74 year old man well known to me for a couple of recent prolonged hospitalizations. He has a history significant for coronary artery disease, he is status post a mechanical aortic valve replacement is maintained on chronic anticoagulation with Coumadin. He has had 2 recent hospitalizations in October and November for GI bleeding. During one of those hospitalizations an EGD was performed that showed erosive gastritis. By GI recommendations he was taken off aspirin and Plavix. During that hospitalization in November he also suffered a non-ST elevated MI and the decision was made to not perform a cardiac catheterization as we could not commit him to long-term antiplatelet therapy given his GI bleeding. He states that ever since his discharge in November he has had anginal chest pain. He recently saw Dr. [name] at which time his Imdur was increased. His pain is responsive to nitroglycerin. Today he went to visit a doctor for his pain medication refill and while there developed chest pain at which time EMS was called and transported him to the hospital. Interestingly on 08/03/2015 he had a stress test that was a low risk study with inferior and inferolateral scar without ischemia. In the ED his second troponin has risen to 0.24. Case has been discussed with cardiology and decision has been made to admit him for  medical management of his acute coronary syndrome.  Allergies:  No Known Allergies    Past Medical History  Diagnosis Date  . Type 2 diabetes mellitus ([place]) 2007  . Essential hypertension   . Gout   . Hypercholesteremia   . Peripheral vascular disease ([place])     Lower extremity PCI/stenting  . COPD (chronic obstructive pulmonary disease) ([place])   . S/P aortic valve replacement 1990    a. St. Jude  . Chronic back  pain   . Dysphagia   . Neuromuscular disorder ([place])   . Peripheral neuropathy ([place])   . Critical lower limb ischemia   . CAD (coronary artery disease)     a. 05/13/14 [name] s/p overlapping DESx2 to SVG to RCA. b.  s/p CABG '90 with redo '94 & stent to RCA SVG in 2005  . Chronic toe ulcer ([place])     a. Left foot  . Chronic systolic heart failure ([place])   . History of kidney stones   . GERD (gastroesophageal reflux disease)   . Arthritis   . Sleep apnea   . Myocardial infarction ([place])   . GI bleeding 05/31/2015    Source not identified.  . Acute blood loss anemia 02/2015 & 05/2015    Hemoglobin 5.5 on 05/31/15; status post transfusion.  . CHF (congestive heart failure) ([place])   . Asthma     Past Surgical History  Procedure Laterality Date  . Aortic valve replacement  1990    St. Jude  . Rotator cuff repair      right  . Cataract extraction      bilateral  . Coronary stent placement  2005    RCA vein graft A 3.0x13.0 TAXUS stent was then placed int he vessel a Viva 3.0x4.0 (perfusion balloon was made ready it was placed through the entire lenght of the stent  . Peripheral vascular procedures lower extremities  right external iliac  artery PTA and stenting as well as bilateral SFA intervention remotely. Repeat procedures in 2011 bilaterally  . Coronary artery bypass graft  1994    6 vessels  . [name]  06/13/2011    Procedure: Venia Minks DILATION;  Surgeon: [name], MD;  Location: [facility];  Service: Endoscopy;  Laterality: N/A;  Dilated to 56.   . Angioplasty illiac artery    . Back surgery  [id number]    2  . Eye surgery    . Amputation Left 07/13/2014    Procedure: Transmetatarsal Amputation;  Surgeon: [name], MD;  Location: [facility];  Service: Orthopedics;  Laterality: Left;  . Lower extremity angiogram N/A 02/15/2013    Procedure: LOWER EXTREMITY ANGIOGRAM;  Surgeon: [name], MD;  Location: [facility] CATH LAB;  Service: Cardiovascular;  Laterality: N/A;  .  Left heart catheterization with coronary angiogram N/A 05/11/2014    Procedure: LEFT HEART CATHETERIZATION WITH CORONARY ANGIOGRAM;  Surgeon: [name], MD;  Location: [facility] CATH LAB;  Service: Cardiovascular;  Laterality: N/A;  . Percutaneous coronary stent intervention (pci-s) N/A 05/13/2014    Procedure: PERCUTANEOUS CORONARY STENT INTERVENTION (PCI-S);  Surgeon: [name], MD;  Location: [facility] CATH LAB;  Service: Cardiovascular;  Laterality: N/A;  . Lower extremity angiogram N/A 06/06/2014    Procedure: LOWER EXTREMITY ANGIOGRAM;  Surgeon: [name], MD;  Location[facility] CATH LAB;  Service: Cardiovascular;  Laterality: N/A;  . Amputation Left 08/20/2014    Procedure: Revision Transmetatarsal Amputation versus Below Knee Amputation;  Surgeon: [name], MD;  Location: [facility];  Service: Orthopedics;  Laterality: Left;  . Stump revision Left 09/23/2014    Procedure: Revision Left Below Knee Amputation;  Surgeon: [name], MD;  Location: [place];  Service: Orthopedics;  Laterality: Left;  . Stump revision Left 10/13/2014    Procedure: REVISION LEFT BELOW KNEE AMPUTATION STUMP;  Surgeon: [name], MD;  Location: [facility];  Service: Orthopedics;  Laterality: Left;  . Esophagogastroduodenoscopy N/A 02/09/2015    DR. Schooler: Normal EGD  . Esophagogastroduodenoscopy  05/2011    Dr. [name]: s/p esophageal dilation, antral erosions/nodularity with benign biopsies  . Colonoscopy  05/2011    Dr. [name]: benign rectal polyp, left sided tics, ascending colonic ulcers (path c/w ischemia)  . Colonoscopy with propofol N/A 06/01/2015    RMR: normal appearing rectal mucosa. Scattered left-sided diverticula. the remainder of the colonic mucosa appeared normal. the distal 5 cm of terminal ileal mucosa also appeared normal. retroflexion was performed.   Freda Munro capsule study N/A 06/08/2015    Procedure: GIVENS CAPSULE STUDY;  Surgeon: [name], MD;  Location: [facility];   Service: Endoscopy;  Laterality: N/A;  0700   . Esophagogastroduodenoscopy (egd) with propofol N/A 06/20/2015    Procedure: ESOPHAGOGASTRODUODENOSCOPY (EGD) WITH PROPOFOL;  Surgeon: Danie Binder, MD;  Location: [facility];  Service: Endoscopy;  Laterality: N/A;  . Esophageal biopsy N/A 06/20/2015    Procedure: BIOPSY;  Surgeon: Danie Binder, MD;  Location: [facility];  Service: Endoscopy;  Laterality: N/A;    Prior to Admission medications   Medication Sig Start Date End Date Taking? Authorizing Provider  albuterol (PROVENTIL) 4 MG tablet Take 4 mg by mouth 3 (three) times daily. 01/20/15  Yes Historical Provider, MD  albuterol-ipratropium (COMBIVENT) 18-103 MCG/ACT inhaler Inhale 1 puff into the lungs 4 (four) times daily. Coughing/ Shortness of Breath   Yes Historical Provider, MD  allopurinol (  ZYLOPRIM) 300 MG tablet Take 300 mg by mouth daily.   Yes Historical Provider, MD  cholecalciferol (VITAMIN D) 1000 UNITS tablet Take 2,000 Units by mouth daily.   Yes Historical Provider, MD  clopidogrel (PLAVIX) 75 MG tablet Take 75 mg by mouth daily. 07/21/15  Yes Historical Provider, MD  Emollient (EUCERIN) lotion Apply 10 mLs topically as needed for dry skin.   Yes Historical Provider, MD  fenofibrate (TRICOR) 145 MG tablet TAKE 1 TABLET BY MOUTH ONCE DAILY FOR CHOLESTEROL. 07/21/15  Yes [name], MD  ferrous sulfate 325 (65 FE) MG EC tablet Take 1 tablet (325 mg total) by mouth daily with breakfast. 06/02/15  Yes Rexene Alberts, MD  fish oil-omega-3 fatty acids 1000 MG capsule Take 1 capsule (1 g total) by mouth 2 (two) times daily. 01/22/13  Yes Kristin L Alvstad, RPH-CPP  furosemide (LASIX) 80 MG tablet Take 80 mg by mouth daily. 07/21/15  Yes Historical Provider, MD  glimepiride (AMARYL) 1 MG tablet Take 1 mg by mouth 2 (two) times daily.   Yes Historical Provider, MD  isosorbide mononitrate (IMDUR) 60 MG 24 hr tablet Take 1 tablet (60 mg total) by mouth daily. 07/27/15  Yes Troy Sine, MD    metoprolol succinate (TOPROL-XL) 50 MG 24 hr tablet Take 50 mg by mouth daily. 06/22/15  Yes Historical Provider, MD  NITROSTAT 0.4 MG SL tablet PLACE 1 TAB UNDER TONGUE EVERY 5 MIN IF NEEDED FOR CHEST PAIN. MAY USE 3 TIMES.NO RELIEF CALL 911. 07/20/15  Yes Eileen Stanford, PA-C  oxyCODONE-acetaminophen (PERCOCET) 10-325 MG per tablet Take 1 tablet by mouth every 4 (four) hours as needed for pain. Patient taking differently: Take 1 tablet by mouth every 3 (three) hours as needed for pain.  09/23/14  Yes [name], MD  pantoprazole (PROTONIX) 40 MG tablet Take 1 tablet (40 mg total) by mouth 2 (two) times daily before a meal. 06/23/15  Yes Leda Bellefeuille Leonie Green, MD  potassium chloride SA (K-DUR,KLOR-CON) 20 MEQ tablet Take 20 mEq by mouth daily.     Yes Historical Provider, MD  pregabalin (LYRICA) 50 MG capsule Take one capsule by mouth twice daily for pains Patient taking differently: Take 50 mg by mouth 3 (three) times daily.  08/23/14  Yes Mahima Pandey, MD  ramipril (ALTACE) 2.5 MG capsule Take 1 capsule (2.5 mg total) by mouth 2 (two) times daily. Patient taking differently: Take 2.5-5 mg by mouth 2 (two) times daily. Takes 60m capsule daily in the morning and takes 2.588mcapsule at bedtime 06/02/15  Yes DeRexene AlbertsMD  rosuvastatin (CRESTOR) 20 MG tablet TAKE (1) TABLET BY MOUTH AT BEDTIME FOR CHOLESTEROL. 07/21/15  Yes JoLorretta HarpMD  sitaGLIPtin (JANUVIA) 50 MG tablet Take 50 mg by mouth daily.   Yes Historical Provider, MD  sucralfate (CARAFATE) 1 GM/10ML suspension Take 10 mLs (1 g total) by mouth 4 (four) times daily -  with meals and at bedtime. 07/28/15  Yes RoCarmin MuskratMD  vitamin C (ASCORBIC ACID) 500 MG tablet Take 500 mg by mouth daily.   Yes Historical Provider, MD  warfarin (COUMADIN) 5 MG tablet Take 1 tablet by mouth daily or as directed by coumadin clinic. Patient taking differently: Take 2.5-5 mg by mouth. 2.39m439mn Mondays and Fridays only. Takes 39mg30m all  other day 06/02/15  Yes DeniRexene Alberts    Social History:  reports that he quit smoking about 15 months ago. His smoking use included  Cigarettes. He started smoking about 62 years ago. He has a 55 pack-year smoking history. His smokeless tobacco use includes Chew. He reports that he does not drink alcohol or use illicit drugs.  Family History  Problem Relation Age of Onset  . Colon cancer Neg Hx   . Liver disease Neg Hx   . Diabetes Father   . Heart failure Other     Review of Systems:  Constitutional: Denies fever, chills, diaphoresis, appetite change and fatigue.  HEENT: Denies photophobia, eye pain, redness, hearing loss, ear pain, congestion, sore throat, rhinorrhea, sneezing, mouth sores, trouble swallowing, neck pain, neck stiffness and tinnitus.   Respiratory: Denies SOB, DOE, cough, chest tightness,  and wheezing.   Cardiovascular: Denies, palpitations and leg swelling.  Gastrointestinal: Denies nausea, vomiting, abdominal pain, diarrhea, constipation, blood in stool and abdominal distention.  Genitourinary: Denies dysuria, urgency, frequency, hematuria, flank pain and difficulty urinating.  Endocrine: Denies: hot or cold intolerance, sweats, changes in hair or nails, polyuria, polydipsia. Musculoskeletal: Denies myalgias, back pain, joint swelling, arthralgias and gait problem.  Skin: Denies pallor, rash and wound.  Neurological: Denies dizziness, seizures, syncope, weakness, light-headedness, numbness and headaches.  Hematological: Denies adenopathy. Easy bruising, personal or family bleeding history  Psychiatric/Behavioral: Denies suicidal ideation, mood changes, confusion, nervousness, sleep disturbance and agitation   Physical Exam: Blood pressure 153/75, pulse 62, temperature 97.5 F (36.4 C), temperature source Oral, resp. rate 18, height 5' 10.5" (1.791 m), weight 96.163 kg (212 lb), SpO2 100 %. General: Alert, awake, oriented 3 HEENT: Normocephalic, atraumatic,  pupils equal round and reactive to light, extraocular movements intact Neck: Supple, no JVD, no lymphadenopathy, no bruits, no goiter Cardiovascular: Regular rate and rhythm, no murmurs, rubs or gallops Lungs: Clear to auscultation bilaterally Abdomen: Soft, nontender, nondistended, positive bowel sounds Extremities: Status post left BKA, right trace edema Neurologic: Grossly intact and nonfocal  Labs on Admission:  Results for orders placed or performed during the hospital encounter of 08/07/15 (from the past 48 hour(s))  Basic metabolic panel     Status: Abnormal   Collection Time: 08/07/15  8:13 AM  Result Value Ref Range   Sodium 140 135 - 145 mmol/L   Potassium 3.5 3.5 - 5.1 mmol/L   Chloride 107 101 - 111 mmol/L   CO2 25 22 - 32 mmol/L   Glucose, Bld 300 (H) 65 - 99 mg/dL   BUN 27 (H) 6 - 20 mg/dL   Creatinine, Ser 1.36 (H) 0.61 - 1.24 mg/dL   Calcium 9.9 8.9 - 10.3 mg/dL   GFR calc non Af Amer 50 (L) >60 mL/min   GFR calc Af Amer 58 (L) >60 mL/min    Comment: (NOTE) The eGFR has been calculated using the CKD EPI equation. This calculation has not been validated in all clinical situations. eGFR's persistently <60 mL/min signify possible Chronic Kidney Disease.    Anion gap 8 5 - 15  CBC     Status: Abnormal   Collection Time: 08/07/15  8:13 AM  Result Value Ref Range   WBC 7.2 4.0 - 10.5 K/uL   RBC 3.54 (L) 4.22 - 5.81 MIL/uL   Hemoglobin 9.0 (L) 13.0 - 17.0 g/dL   HCT 29.5 (L) 39.0 - 52.0 %   MCV 83.3 78.0 - 100.0 fL   MCH 25.4 (L) 26.0 - 34.0 pg   MCHC 30.5 30.0 - 36.0 g/dL   RDW 18.1 (H) 11.5 - 15.5 %   Platelets 260 150 - 400 K/uL  Troponin I  Status: Abnormal   Collection Time: 08/07/15  8:13 AM  Result Value Ref Range   Troponin I 0.06 (H) <0.031 ng/mL    Comment:        PERSISTENTLY INCREASED TROPONIN VALUES IN THE RANGE OF 0.04-0.49 ng/mL CAN BE SEEN IN:       -UNSTABLE ANGINA       -CONGESTIVE HEART FAILURE       -MYOCARDITIS       -CHEST  TRAUMA       -ARRYHTHMIAS       -LATE PRESENTING MYOCARDIAL INFARCTION       -COPD   CLINICAL FOLLOW-UP RECOMMENDED.   Protime-INR - (order if Patient is taking Coumadin / Warfarin)     Status: Abnormal   Collection Time: 08/07/15  8:13 AM  Result Value Ref Range   Prothrombin Time 28.8 (H) 11.6 - 15.2 seconds   INR 2.77 (H) 0.00 - 1.49  Troponin I     Status: Abnormal   Collection Time: 08/07/15 11:33 AM  Result Value Ref Range   Troponin I 0.24 (H) <0.031 ng/mL    Comment:        PERSISTENTLY INCREASED TROPONIN VALUES IN THE RANGE OF 0.04-0.49 ng/mL CAN BE SEEN IN:       -UNSTABLE ANGINA       -CONGESTIVE HEART FAILURE       -MYOCARDITIS       -CHEST TRAUMA       -ARRYHTHMIAS       -LATE PRESENTING MYOCARDIAL INFARCTION       -COPD   CLINICAL FOLLOW-UP RECOMMENDED.     Radiological Exams on Admission: Dg Chest 2 View  08/07/2015  CLINICAL DATA:  Chest pain. EXAM: CHEST  2 VIEW COMPARISON:  07/28/2015.  05/12/2014 .  08/10/2013. FINDINGS: Prior CABG. Cardiomegaly. Pulmonary vascularity normal. Stable basilar pleural parenchymal thickening on the left noted consistent with scarring. IMPRESSION: 1. Cardiomegaly. Stable cardiomegaly. No pulmonary venous congestion. 2. 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 -Unfortunately we cannot place him on antiplatelet therapy due to his recent severe recurrent GI bleeding.  -will continue Coumadin for mechanical valve -We'll request cardiology input in a.m.  History of aortic valve replacement -Continue Coumadin as dosed by pharmacy.  Ischemic cardiomyopathy  -with known ejection fraction of 25-30%. -Continue beta blocker, lisinopril, imdur, statin  Hyperlipidemia -Continue statin  Type 2 diabetes -Check hemoglobin A1c  -continue oral agents and add sliding scale insulin  Recent history of GI bleed -With erosive gastritis on EGD. -Continue Protonix and sucralfate.  DVT prophylaxis -Fully anticoagulated on Coumadin next  CODE STATUS  -Full code   Time Spent on Admission: 95 minutes  [facility] Pager: [phone] 08/07/2015, 5:58 PM

## 2015-08-07 NOTE — ED Notes (Signed)
Report given to receiving RN. Pt ready for transport. 

## 2015-08-07 NOTE — Progress Notes (Signed)
Notified Dr.Schorr or critical troponin 1.11. No new orders at this time. Will continue to monitor

## 2015-08-08 ENCOUNTER — Encounter (HOSPITAL_COMMUNITY): Payer: Self-pay | Admitting: Cardiology

## 2015-08-08 DIAGNOSIS — E785 Hyperlipidemia, unspecified: Secondary | ICD-10-CM

## 2015-08-08 DIAGNOSIS — I255 Ischemic cardiomyopathy: Secondary | ICD-10-CM

## 2015-08-08 DIAGNOSIS — Z7901 Long term (current) use of anticoagulants: Secondary | ICD-10-CM

## 2015-08-08 DIAGNOSIS — Z954 Presence of other heart-valve replacement: Secondary | ICD-10-CM

## 2015-08-08 DIAGNOSIS — I214 Non-ST elevation (NSTEMI) myocardial infarction: Principal | ICD-10-CM

## 2015-08-08 DIAGNOSIS — Z89512 Acquired absence of left leg below knee: Secondary | ICD-10-CM

## 2015-08-08 DIAGNOSIS — Z8719 Personal history of other diseases of the digestive system: Secondary | ICD-10-CM

## 2015-08-08 LAB — GLUCOSE, CAPILLARY
GLUCOSE-CAPILLARY: 160 mg/dL — AB (ref 65–99)
Glucose-Capillary: 268 mg/dL — ABNORMAL HIGH (ref 65–99)
Glucose-Capillary: 158 mg/dL — ABNORMAL HIGH (ref 65–99)
Glucose-Capillary: 211 mg/dL — ABNORMAL HIGH (ref 65–99)

## 2015-08-08 LAB — PROTIME-INR
INR: 3.32 — ABNORMAL HIGH (ref 0.00–1.49)
Prothrombin Time: 33 seconds — ABNORMAL HIGH (ref 11.6–15.2)

## 2015-08-08 LAB — TROPONIN I
Troponin I: 1.1 ng/mL (ref <0.031)
Troponin I: 0.86 ng/mL (ref <0.031)
Troponin I: 0.77 ng/mL (ref <0.031)

## 2015-08-08 LAB — HEMOGLOBIN A1C
Hgb A1c MFr Bld: 7 % — ABNORMAL HIGH (ref 4.8–5.6)
MEAN PLASMA GLUCOSE: 154 mg/dL

## 2015-08-08 MED ORDER — IPRATROPIUM-ALBUTEROL 0.5-2.5 (3) MG/3ML IN SOLN: 3.0000 mL | RESPIRATORY_TRACT | Status: DC | PRN

## 2015-08-08 MED ORDER — OXYCODONE HCL 5 MG PO TABS
10.0000 mg | ORAL_TABLET | Freq: Four times a day (QID) | ORAL | Status: DC | PRN
  Administered 2015-08-08 – 2015-08-10 (×3): 10 mg via ORAL
  Filled 2015-08-08 (×3): qty 2

## 2015-08-08 NOTE — Progress Notes (Signed)
[facility] PROGRESS NOTE  DEAKIN LACEK DHR:[id number] DOB: Feb 15, 1941 DOA: 08/07/2015 PCP: [name], MD  Assessment/Plan: NSTEMI -Troponins maxed out at 1.25. -Medical management only given severe GI bleed when placed on antiplatelet therapy limits placement of potential stent. -Imdur was increased to 60 mg on December 8, however patient only took his first increased dose on admission.  -Has not had further chest pain since admission. -Cardiology following.  History of aortic valve replacement -Continue Coumadin.  Ischemic cardiomyopathy -Known ejection fraction of 25-30%. -Continue beta blocker, lisinopril, Imdur, statin  Hyperlipidemia -Continue statin  Type 2 diabetes -CBGs elevated, A1c 7.0. -Follow and adjust regimen as needed.  Recent history of GI bleed -With erosive gastritis on EGD, continue Protonix and sucralfate  Code Status: Full code Family Communication: Patient only  Disposition Plan: Consider discharge home in 24-48 hours if no recurrent chest pain   Consultants:  Cardiology   Antibiotics:  None   Subjective: No further chest pain, no shortness of breath  Objective: Filed Vitals:   08/07/15 2124 08/08/15 0124 08/08/15 0524 08/08/15 0755  BP: 132/49 94/55 109/44   Pulse: 64 57 53   Temp: 99.3 F (37.4 C) 98.4 F (36.9 C) 98.4 F (36.9 C)   TempSrc: Oral Oral Oral   Resp: '16 16 17   '$ Height:      Weight:      SpO2:  100% 100% 97%    Intake/Output Summary (Last 24 hours) at 08/08/15 1444 Last data filed at 08/08/15 0827  Gross per 24 hour  Intake    720 ml  Output   1000 ml  Net   -280 ml   Filed Weights   08/07/15 0757  Weight: 96.163 kg (212 lb)    Exam:   General:  Alert, awake, oriented 3  Cardiovascular: Regular rate and rhythm  Respiratory: Clear to auscultation bilaterally  Abdomen: Soft, nontender, nondistended  Extremities: Status post left below-knee amputation, right no edema    Neurologic:  Nonfocal  Data Reviewed: Basic Metabolic Panel:  Recent Labs Lab 08/07/15 0813  NA 140  K 3.5  CL 107  CO2 25  GLUCOSE 300*  BUN 27*  CREATININE 1.36*  CALCIUM 9.9   Liver Function Tests: No results for input(s): AST, ALT, ALKPHOS, BILITOT, PROT, ALBUMIN in the last 168 hours. No results for input(s): LIPASE, AMYLASE in the last 168 hours. No results for input(s): AMMONIA in the last 168 hours. CBC:  Recent Labs Lab 08/07/15 0813  WBC 7.2  HGB 9.0*  HCT 29.5*  MCV 83.3  PLT 260   Cardiac Enzymes:  Recent Labs Lab 08/07/15 1819 08/07/15 2043 08/07/15 2320 08/08/15 0621 08/08/15 1214  TROPONINI 1.04* 1.11* 1.25* 1.10* 0.86*   BNP (last 3 results)  Recent Labs  12/28/14 0134 02/04/15 0413 07/28/15 1944  BNP 504.3* 593.2* 156.0*    ProBNP (last 3 results) No results for input(s): PROBNP in the last 8760 hours.  CBG:  Recent Labs Lab 08/07/15 2124 08/08/15 0741 08/08/15 1156  GLUCAP 259* 160* 268*    No results found for this or any previous visit (from the past 240 hour(s)).   Studies: Dg Chest 2 View  08/07/2015  CLINICAL DATA:  Chest pain. EXAM: CHEST  2 VIEW COMPARISON:  07/28/2015.  05/12/2014 .  08/10/2013. FINDINGS: Prior CABG. Cardiomegaly. Pulmonary vascularity normal. Stable basilar pleural parenchymal thickening on the left noted consistent with scarring. IMPRESSION: 1. Cardiomegaly. Stable cardiomegaly. No pulmonary venous congestion. 2. Stable left base  pleural parenchymal thickening consistent with scarring Electronically Signed   By: Java   On: 08/07/2015 08:36    Scheduled Meds: . allopurinol  300 mg Oral Daily  . fenofibrate  160 mg Oral Daily  . ferrous sulfate  325 mg Oral Q breakfast  . furosemide  80 mg Oral Daily  . glimepiride  1 mg Oral BID WC  . Influenza vac split quadrivalent PF  0.5 mL Intramuscular Tomorrow-1000  . insulin aspart  0-5 Units Subcutaneous QHS  . insulin aspart  0-9  Units Subcutaneous TID WC  . insulin aspart  3 Units Subcutaneous TID WC  . isosorbide mononitrate  60 mg Oral Daily  . linagliptin  5 mg Oral Daily  . metoprolol succinate  50 mg Oral Daily  . omega-3 acid ethyl esters  1 g Oral BID  . pantoprazole  40 mg Oral BID AC  . pneumococcal 23 valent vaccine  0.5 mL Intramuscular Tomorrow-1000  . potassium chloride SA  20 mEq Oral Daily  . pregabalin  50 mg Oral TID  . ramipril  5 mg Oral Daily   And  . ramipril  2.5 mg Oral QHS  . rosuvastatin  20 mg Oral q1800  . 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 Greater than 50% of this time was spent in direct contact with the patient coordinating care.    Lelon Frohlich  [facility] Pager [phone]  If 7PM-7AM, please contact night-coverage at [email or web address], password [facility] 08/08/2015, 2:44 PM  LOS: 1 day

## 2015-08-08 NOTE — Progress Notes (Signed)
ANTICOAGULATION CONSULT NOTE - Initial Consult  Pharmacy Consult for coumadin Indication: mechanical vavle  No Known Allergies  Patient Measurements: Height: 5' 10.5" (179.1 cm) Weight: 212 lb (96.163 kg) IBW/kg (Calculated) : 74.15   Vital Signs: Temp: 98.4 F (36.9 C) (12/20 0524) Temp Source: Oral (12/20 0524) BP: 109/44 mmHg (12/20 0524) Pulse Rate: 53 (12/20 0524)  Labs:  Recent Labs  08/07/15 0813  08/07/15 2043 08/07/15 2320 08/08/15 0621 08/08/15 1214  HGB 9.0*  --   --   --   --   --   HCT 29.5*  --   --   --   --   --   PLT 260  --   --   --   --   --   LABPROT 28.8*  --   --   --   --  33.0*  INR 2.77*  --   --   --   --  3.32*  CREATININE 1.36*  --   --   --   --   --   TROPONINI 0.06*  < > 1.11* 1.25* 1.10*  --   < > = values in this interval not displayed.  Estimated Creatinine Clearance: 55.9 mL/min (by C-G formula based on Cr of 1.36).   Medical History: Past Medical History  Diagnosis Date  . Type 2 diabetes mellitus ([place]) 2007  . Essential hypertension   . Gout   . Hypercholesteremia   . Peripheral vascular disease ([place])     Lower extremity PCI/stenting  . COPD (chronic obstructive pulmonary disease) ([place])   . S/P aortic valve replacement 1990    a. St. Jude  . Chronic back pain   . Dysphagia   . Neuromuscular disorder ([place])   . Peripheral neuropathy ([place])   . Critical lower limb ischemia   . CAD (coronary artery disease)     a. 05/13/14 [name] s/p overlapping DESx2 to SVG to RCA. b.  s/p CABG '90 with redo '94 & stent to RCA SVG in 2005  . Chronic toe ulcer ([place])     a. Left foot  . Chronic systolic heart failure ([place])   . History of kidney stones   . GERD (gastroesophageal reflux disease)   . Arthritis   . Sleep apnea   . Myocardial infarction ([place])   . GI bleeding 05/31/2015    Source not identified.  . Acute blood loss anemia 02/2015 & 05/2015    Hemoglobin 5.5 on 05/31/15; status post transfusion.  . CHF (congestive heart  failure) ([place])   . Asthma     Medications:  Prescriptions prior to admission  Medication Sig Dispense Refill Last Dose  . albuterol (PROVENTIL) 4 MG tablet Take 4 mg by mouth 3 (three) times daily.   08/06/2015 at Unknown time  . albuterol-ipratropium (COMBIVENT) 18-103 MCG/ACT inhaler Inhale 1 puff into the lungs 4 (four) times daily. Coughing/ Shortness of Breath   08/06/2015 at Unknown time  . allopurinol (ZYLOPRIM) 300 MG tablet Take 300 mg by mouth daily.   08/06/2015 at Unknown time  . cholecalciferol (VITAMIN D) 1000 UNITS tablet Take 2,000 Units by mouth daily.   08/06/2015 at Unknown time  . clopidogrel (PLAVIX) 75 MG tablet Take 75 mg by mouth daily.   08/06/2015 at Unknown time  . Emollient (EUCERIN) lotion Apply 10 mLs topically as needed for dry skin.   unknown  . fenofibrate (TRICOR) 145 MG tablet TAKE 1 TABLET BY MOUTH ONCE DAILY FOR CHOLESTEROL. 30 tablet 0  08/06/2015 at Unknown time  . ferrous sulfate 325 (65 FE) MG EC tablet Take 1 tablet (325 mg total) by mouth daily with breakfast.   08/06/2015 at Unknown time  . fish oil-omega-3 fatty acids 1000 MG capsule Take 1 capsule (1 g total) by mouth 2 (two) times daily.   08/06/2015 at Unknown time  . furosemide (LASIX) 80 MG tablet Take 80 mg by mouth daily.   08/06/2015 at Unknown time  . glimepiride (AMARYL) 1 MG tablet Take 1 mg by mouth 2 (two) times daily.   08/06/2015 at Unknown time  . isosorbide mononitrate (IMDUR) 60 MG 24 hr tablet Take 1 tablet (60 mg total) by mouth daily. 90 tablet 3 08/06/2015 at Unknown time  . metoprolol succinate (TOPROL-XL) 50 MG 24 hr tablet Take 50 mg by mouth daily.   08/06/2015 at 0830  . NITROSTAT 0.4 MG SL tablet PLACE 1 TAB UNDER TONGUE EVERY 5 MIN IF NEEDED FOR CHEST PAIN. MAY USE 3 TIMES.NO RELIEF CALL 911. 25 tablet 4 08/07/2015 at Unknown time  . oxyCODONE-acetaminophen (PERCOCET) 10-325 MG per tablet Take 1 tablet by mouth every 4 (four) hours as needed for pain. (Patient taking  differently: Take 1 tablet by mouth every 3 (three) hours as needed for pain. ) 30 tablet 0 08/06/2015 at Unknown time  . pantoprazole (PROTONIX) 40 MG tablet Take 1 tablet (40 mg total) by mouth 2 (two) times daily before a meal. 60 tablet 2 08/06/2015 at Unknown time  . potassium chloride SA (K-DUR,KLOR-CON) 20 MEQ tablet Take 20 mEq by mouth daily.     08/06/2015 at Unknown time  . pregabalin (LYRICA) 50 MG capsule Take one capsule by mouth twice daily for pains (Patient taking differently: Take 50 mg by mouth 3 (three) times daily. ) 60 capsule 5 08/06/2015 at Unknown time  . ramipril (ALTACE) 2.5 MG capsule Take 1 capsule (2.5 mg total) by mouth 2 (two) times daily. (Patient taking differently: Take 2.5-5 mg by mouth 2 (two) times daily. Takes '5mg'$  capsule daily in the morning and takes 2.'5mg'$  capsule at bedtime) 60 capsule 3 08/06/2015 at Unknown time  . rosuvastatin (CRESTOR) 20 MG tablet TAKE (1) TABLET BY MOUTH AT BEDTIME FOR CHOLESTEROL. 30 tablet 0 08/06/2015 at Unknown time  . sitaGLIPtin (JANUVIA) 50 MG tablet Take 50 mg by mouth daily.   08/06/2015 at Unknown time  . sucralfate (CARAFATE) 1 GM/10ML suspension Take 10 mLs (1 g total) by mouth 4 (four) times daily -  with meals and at bedtime. 420 mL 0 08/06/2015 at Unknown time  . vitamin C (ASCORBIC ACID) 500 MG tablet Take 500 mg by mouth daily.   08/06/2015 at Unknown time  . warfarin (COUMADIN) 5 MG tablet Take 1 tablet by mouth daily or as directed by coumadin clinic. (Patient taking differently: Take 2.5-5 mg by mouth. 2.'5mg'$  on Mondays and Fridays only. Takes '5mg'$  on all other day) 30 tablet 1 08/06/2015 at 2030    Assessment: 74 yo man to continue coumadin for mechanical valve.  He has had recent admissions for GI bleeds.  He is admitted for evaluation of chest pain.  INR today is 3.32.  INR yesterday was 2.77.  He did not receive coumadin yesterday.  Goal of Therapy:  INR 2.5-3.5   Plan:  No coumadin today as INR has increased  with no dose given yesterday and h/o GI bleed. Daily PT/INR  Thanks for allowing pharmacy to be a part of this patient's care.  Excell Seltzer,  PharmD Clinical Pharmacist 08/08/2015,12:44 PM

## 2015-08-08 NOTE — Progress Notes (Signed)
**Note De-Identified Tionne Carelli Obfuscation** Patient states that he does not take breathing medications at home and he feels that he does not need them during his stay in th hospital.  Treatment schedule changed to Q4 PRN for increased WOB, SOB, and wheeze.  BBS diminished on left side, clr with slight diminished on right.  CXR: Left basilar thickening.  SAT 97% on RA.  RRT will continue to monitor.

## 2015-08-08 NOTE — Consult Note (Addendum)
Primary cardiologist: Dr. [name] Consulting cardiologist: Dr. Satira Sark  Reason for consultation: Chest pain, abnormal troponin I  Clinical Summary Tanner Pena is a medically complex 74 y.o.male with past medical history outlined below, just recently seen for cardiology follow-up by Dr. [name] on December 8 and subsequent underwent a follow-up Myoview study. He was having increasing episodes of chest discomfort at that time, having run out of sucralfate, but also potentially anginal in etiology. He was placed back on to sucralfate and also had Imdur increased to 60 mg daily. Lexiscan Myoview showed evidence of inferior/inferolateral scar without active ischemia. The EF by recent echocardiogram in November was approximately 40%. Medical therapy was anticipated going forward.  He is now admitted to the hospital after a prolonged episode of chest discomfort that occurred while he was shaving in the morning yesterday. He states that it felt like his typical angina, he took nitroglycerin twice, and by the time that he got to his primary care physician's office and was evaluated, EMS was summoned to send him to the ER with continued chest discomfort. Ultimately his symptoms resolved after several minutes and a third glycerin.  Cardiac enzymes trended abnormally with a peak troponin I up to 1.25. ECG shows sinus rhythm with left bundle branch block, somewhat discordant ST elevation in lead 3 only but no significant change in the high lateral reciprocal leads. Under hospital observation he has had no recurrent chest discomfort.  Lab work shows he remains anemic, hemoglobin 9.0, no obvious active bleeding however. He remains on Coumadin with INR 2.7 up to 3.3.  Of note, he has a history of recurrent GI bleeding, at times severe, and in fact has already been taken off of antiplatelet regimen given ongoing need for Coumadin with a St. Jude mechanical aortic prosthesis.  We are consulted to  assist with his management.   No Known Allergies  Medications Scheduled Medications: . allopurinol  300 mg Oral Daily  . fenofibrate  160 mg Oral Daily  . ferrous sulfate  325 mg Oral Q breakfast  . furosemide  80 mg Oral Daily  . glimepiride  1 mg Oral BID WC  . Influenza vac split quadrivalent PF  0.5 mL Intramuscular Tomorrow-1000  . insulin aspart  0-5 Units Subcutaneous QHS  . insulin aspart  0-9 Units Subcutaneous TID WC  . insulin aspart  3 Units Subcutaneous TID WC  . isosorbide mononitrate  60 mg Oral Daily  . linagliptin  5 mg Oral Daily  . metoprolol succinate  50 mg Oral Daily  . omega-3 acid ethyl esters  1 g Oral BID  . pantoprazole  40 mg Oral BID AC  . pneumococcal 23 valent vaccine  0.5 mL Intramuscular Tomorrow-1000  . potassium chloride SA  20 mEq Oral Daily  . pregabalin  50 mg Oral TID  . ramipril  5 mg Oral Daily   And  . ramipril  2.5 mg Oral QHS  . rosuvastatin  20 mg Oral q1800  . sucralfate  1 g Oral TID WC & HS    PRN Medications: acetaminophen, gi cocktail, ipratropium-albuterol, nitroGLYCERIN, ondansetron (ZOFRAN) IV, oxyCODONE   Past Medical History  Diagnosis Date  . Type 2 diabetes mellitus ([place]) 2007  . Essential hypertension   . Gout   . Hypercholesteremia   . Peripheral vascular disease ([place])     Lower extremity PCI/stenting  . COPD (chronic obstructive pulmonary disease) ([place])   . S/P aortic valve replacement 1990  a. St. Jude  . Chronic back pain   . Dysphagia   . Neuromuscular disorder ([place])   . Peripheral neuropathy ([place])   . Critical lower limb ischemia   . CAD (coronary artery disease)     a. 05/13/14 [name] s/p overlapping DESx2 to SVG to RCA. b.  s/p CABG '90 with redo '94 & stent to RCA SVG in 2005  . Chronic toe ulcer ([place])     a. Left foot  . Chronic systolic heart failure ([place])   . History of kidney stones   . GERD (gastroesophageal reflux disease)   . Arthritis   . Sleep apnea   . Myocardial infarction ([place])     . GI bleeding 05/31/2015    Source not identified.  . Acute blood loss anemia 02/2015 & 05/2015    Hemoglobin 5.5 on 05/31/15; status post transfusion.    Past Surgical History  Procedure Laterality Date  . Aortic valve replacement  1990    St. Jude  . Rotator cuff repair      right  . Cataract extraction      bilateral  . Coronary stent placement  2005    RCA vein graft A 3.0x13.0 TAXUS stent was then placed int he vessel a Viva 3.0x4.0 (perfusion balloon was made ready it was placed through the entire lenght of the stent  . Peripheral vascular procedures lower extremities      Right external iliac  artery PTA and stenting as well as bilateral SFA intervention remotely. Repeat procedures in 2011 bilaterally  . Coronary artery bypass graft  1994    6 vessels  . [name]  06/13/2011    Procedure: Venia Minks DILATION;  Surgeon: [name], MD;  Location: [facility];  Service: Endoscopy;  Laterality: N/A;  Dilated to 56.   . Angioplasty illiac artery    . Back surgery  [id number]    2  . Eye surgery    . Amputation Left 07/13/2014    Procedure: Transmetatarsal Amputation;  Surgeon: [name], MD;  Location: [place];  Service: Orthopedics;  Laterality: Left;  . Lower extremity angiogram N/A 02/15/2013    Procedure: LOWER EXTREMITY ANGIOGRAM;  Surgeon: [name], MD;  Location: [facility] C[facility] LAB;  Service: Cardiovascular;  Laterality: N/A;  . Left heart catheterization with coronary angiogram N/A 05/11/2014    Procedure: LEFT HEART CATHETERIZATION WITH CORONARY ANGIOGRAM;  Surgeon: [name], MD;  Location: [facility] CATH LAB;  Service: Cardiovascular;  Laterality: N/A;  . Percutaneous coronary stent intervention (pci-s) N/A 05/13/2014    Procedure: PERCUTANEOUS CORONARY STENT INTERVENTION (PCI-S);  Surgeon: [name], MD;  Location: [facility] CATH LAB;  Service: Cardiovascular;  Laterality: N/A;  . Lower extremity angiogram N/A 06/06/2014    Procedure: LOWER EXTREMITY  ANGIOGRAM;  Surgeon: [name], MD;  Location: [facility] CATH LAB;  Service: Cardiovascular;  Laterality: N/A;  . Amputation Left 08/20/2014    Procedure: Revision Transmetatarsal Amputation versus Below Knee Amputation;  Surgeon: [name], MD;  Location: [place];  Service: Orthopedics;  Laterality: Left;  . Stump revision Left 09/23/2014    Procedure: Revision Left Below Knee Amputation;  Surgeon: [name], MD;  Location: [place];  Service: Orthopedics;  Laterality: Left;  . Stump revision Left 10/13/2014    Procedure: REVISION LEFT BELOW KNEE AMPUTATION STUMP;  Surgeon: [name], MD;  Location: [facility];  Service: Orthopedics;  Laterality: Left;  . Esophagogastroduodenoscopy N/A 02/09/2015    DR.  Schooler: Normal EGD  . Esophagogastroduodenoscopy  05/2011    Dr. [name]: s/p esophageal dilation, antral erosions/nodularity with benign biopsies  . Colonoscopy  05/2011    Dr. [name]: benign rectal polyp, left sided tics, ascending colonic ulcers (path c/w ischemia)  . Colonoscopy with propofol N/A 06/01/2015    RMR: normal appearing rectal mucosa. Scattered left-sided diverticula. the remainder of the colonic mucosa appeared normal. the distal 5 cm of terminal ileal mucosa also appeared normal. retroflexion was performed.   Freda Munro capsule study N/A 06/08/2015    Procedure: GIVENS CAPSULE STUDY;  Surgeon: [name], MD;  Location: [facility];  Service: Endoscopy;  Laterality: N/A;  0700   . Esophagogastroduodenoscopy (egd) with propofol N/A 06/20/2015    Procedure: ESOPHAGOGASTRODUODENOSCOPY (EGD) WITH PROPOFOL;  Surgeon: Danie Binder, MD;  Location: [facility];  Service: Endoscopy;  Laterality: N/A;  . Esophageal biopsy N/A 06/20/2015    Procedure: BIOPSY;  Surgeon: Danie Binder, MD;  Location: [facility];  Service: Endoscopy;  Laterality: N/A;    Family History  Problem Relation Age of Onset  . Colon cancer Neg Hx   . Liver disease Neg Hx   . Diabetes Father   . Heart  failure Other     Social History Tanner Pena reports that he quit smoking about 15 months ago. His smoking use included Cigarettes. He started smoking about 62 years ago. He has a 55 pack-year smoking history. His smokeless tobacco use includes Chew. Tanner Pena reports that he does not drink alcohol.  Review of Systems Complete review of systems negative except as otherwise outlined in the clinical summary and also the following. Hard of hearing. NYHA class II dyspnea. Recurrent reflux.  Physical Examination Blood pressure 109/44, pulse 53, temperature 98.4 F (36.9 C), temperature source Oral, resp. rate 17, height 5' 10.5" (1.791 m), weight 212 lb (96.163 kg), SpO2 97 %.  Intake/Output Summary (Last 24 hours) at 08/08/15 1336 Last data filed at 08/08/15 0827  Gross per 24 hour  Intake    720 ml  Output   1000 ml  Net   -280 ml   Tlelemetry: Sinus rhythm with PVCs.  Gen.: Chronically ill-appearing male in no distress, seated in wheelchair. HEENT: Conjunctiva and lids normal, oropharynx clear with dentition. Neck: Supple, no elevated JVP or carotid bruits, no thyromegaly. Lungs: Clear to auscultation, nonlabored breathing at rest. Cardiac: Regular rate and rhythm, no S3, crisp prosthetic click in S2 with 2/6 systolic murmur, no pericardial rub. Abdomen: Soft, nontender, bowel sounds present, no guarding or rebound. 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 Skin: Warm and dry. Musculoskeletal: No kyphosis. Neuropsychiatric: Alert and oriented x3, affect grossly appropriate. Decreased hearing.  Lab Results  Basic Metabolic Panel:  Recent Labs Lab 08/07/15 0813  NA 140  K 3.5  CL 107  CO2 25  GLUCOSE 300*  BUN 27*  CREATININE 1.36*  CALCIUM 9.9    CBC:  Recent Labs Lab 08/07/15 0813  WBC 7.2  HGB 9.0*  HCT 29.5*  MCV 83.3  PLT 260    Cardiac Enzymes:  Recent Labs Lab 08/07/15 1819 08/07/15 2043  08/07/15 2320 08/08/15 0621 08/08/15 1214  TROPONINI 1.04* 1.11* 1.25* 1.10* 0.86*    Imaging  Chest x-ray 08/07/2015: FINDINGS: Prior CABG. Cardiomegaly. Pulmonary vascularity normal. Stable basilar pleural parenchymal thickening on the left noted consistent with scarring.  IMPRESSION: 1. Cardiomegaly. Stable cardiomegaly. No pulmonary venous congestion. 2. Stable left base  pleural parenchymal thickening consistent with scarring  Impression  1. NSTEMI with peak troponin 1.25 and presentation with prolonged episode of angina. He is currently stable clinically without recurrent angina and at this point plan is for medical therapy in light of his complexity. Since he would not been able to stay on dual antiplatelet therapy in the face of Coumadin with well documented recurring GI bleeds on similar regimen, proceeding to cardiac catheterization would be limited by the inability to actually place a stent for revascularization.  2. Cardiomyopathy with LVEF recently documented in the 40% range.  3. Two-vessel CAD status post CABG with subsequent interventions to the SVG to RCA, most recently DES 2 in September of last year. Would expect graft disease as culprit for his ACS.  4. Status post St. Jude mechanical AVR, on chronic Coumadin.  5. 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  6. Hyperlipidemia, on statin therapy.  7. Essential hypertension.   Recommendations  Discussed with patient and daughter. Continue Toprol-XL, Altace, Crestor, Lasix, potassium supplements, fenofibrate, and Imdur. Would not anticipate follow-up ischemic testing at this time as he just recently underwent a Myoview study as detailed above, possibly had a new plaque rupture or thrombotic event involving the SVG to RCA. Could potentially further advance Imdur if needed, or consider Ranexa.  Satira Sark, M.D., F.A.C.C.

## 2015-08-08 NOTE — Consult Note (Signed)
   [facility] Inpatient Consult   08/08/2015  Tanner Pena 10-23-1940 [id number]  Patient is currently active with [facility] for chronic disease management services.  Patient has been engaged by a [employer].  Our community based plan of care has focused on disease management and community resource support.  Patient will receive a post discharge transition of care call and will be evaluated for monthly home visits for assessments and disease process education.  Made Inpatient Case Manager aware that [facility] following. Of note, [facility] Care Management services does not replace or interfere with any services that are arranged by inpatient case management or social work.   For additional questions or referrals please contact:  Royetta Crochet. Laymond Purser, RN, BSN, [place] [phone]

## 2015-08-09 LAB — PROTIME-INR
INR: 3.01 — ABNORMAL HIGH (ref 0.00–1.49)
PROTHROMBIN TIME: 30.7 s — AB (ref 11.6–15.2)

## 2015-08-09 LAB — GLUCOSE, CAPILLARY
GLUCOSE-CAPILLARY: 153 mg/dL — AB (ref 65–99)
GLUCOSE-CAPILLARY: 178 mg/dL — AB (ref 65–99)
Glucose-Capillary: 145 mg/dL — ABNORMAL HIGH (ref 65–99)
Glucose-Capillary: 317 mg/dL — ABNORMAL HIGH (ref 65–99)

## 2015-08-09 MED ORDER — WARFARIN SODIUM 5 MG PO TABS
2.5000 mg | ORAL_TABLET | Freq: Once | ORAL | Status: AC
  Administered 2015-08-09: 2.5 mg via ORAL
  Filled 2015-08-09: qty 1

## 2015-08-09 MED ORDER — WARFARIN - PHARMACIST DOSING INPATIENT
Status: DC
  Administered 2015-08-09: 16:00:00

## 2015-08-09 NOTE — Progress Notes (Signed)
Pt c/o left sided chest pain.  Describes pain as intermittent, sharp, 4/10 on pain scale.  Pt administered PRN pain medication per MD order.  12 lead EKG completed.  When compared to previous EKG's from this admission no changes noted.  Dr. Marin Comment paged and made aware.  Pt in stable condition at this time.  Will continue to monitor.

## 2015-08-09 NOTE — Progress Notes (Signed)
   Subjective: Intermitt CP  Sharp  Lasts seconds   Objective: Filed Vitals:   08/08/15 2120 08/09/15 0458 08/09/15 0805 08/09/15 1011  BP: 130/43 96/53  119/67  Pulse:   70 60  Temp: 98.8 F (37.1 C) 98.8 F (37.1 C)    TempSrc: Oral Oral    Resp: '20 20 18   '$ Height:      Weight:      SpO2: 97% 97% 97%    Weight change:   Intake/Output Summary (Last 24 hours) at 08/09/15 1257 Last data filed at 08/09/15 1205  Gross per 24 hour  Intake    240 ml  Output    800 ml  Net   -560 ml    General: Alert, awake, oriented x3, in no acute distress Neck:  JVP is normal Heart: Regular rate and rhythm, without murmurs, rubs, gallops. Chest  Nontender    Lungs: Clear to auscultation.  No rales or wheezes. Exemities:  No edema.   Neuro: Grossly intact, nonfocal.  Tele:  SR    Lab Results: Results for orders placed or performed during the hospital encounter of 08/07/15 (from the past 24 hour(s))  Glucose, capillary     Status: Abnormal   Collection Time: 08/08/15  4:58 PM  Result Value Ref Range   Glucose-Capillary 158 (H) 65 - 99 mg/dL  Troponin I (q 6hr x 3)     Status: Abnormal   Collection Time: 08/08/15  6:28 PM  Result Value Ref Range   Troponin I 0.77 (HH) <0.031 ng/mL  Glucose, capillary     Status: Abnormal   Collection Time: 08/08/15  9:19 PM  Result Value Ref Range   Glucose-Capillary 211 (H) 65 - 99 mg/dL   Comment 1 Notify RN    Comment 2 Document in Chart   Protime-INR     Status: Abnormal   Collection Time: 08/09/15  5:07 AM  Result Value Ref Range   Prothrombin Time 30.7 (H) 11.6 - 15.2 seconds   INR 3.01 (H) 0.00 - 1.49  Glucose, capillary     Status: Abnormal   Collection Time: 08/09/15  8:12 AM  Result Value Ref Range   Glucose-Capillary 145 (H) 65 - 99 mg/dL  Glucose, capillary     Status: Abnormal   Collection Time: 08/09/15 11:36 AM  Result Value Ref Range   Glucose-Capillary 317 (H) 65 - 99 mg/dL    Studies/Results: No results  found.  Medications: Reviewed  '@PROBHOSP'$ @  1.  CP  Current CP is atypical  Very short lived  Seconds  May be skel muscular or pleuritic   I would continue plan for continued medical Rx  Trop continues to trend down    2. CAD  Pt with CABG with DES x2 in September.  Keep on current regine    3.  AVR  Chronic coumadin  4.  Ischemic cardiomyopathy  Volume status is OK    LOS: 2 days   Dorris Carnes 08/09/2015, 12:57 PM

## 2015-08-09 NOTE — Progress Notes (Signed)
ANTICOAGULATION CONSULT NOTE - follow up  Pharmacy Consult for coumadin Indication: mechanical vavle  No Known Allergies  Patient Measurements: Height: 5' 10.5" (179.1 cm) Weight: 212 lb (96.163 kg) IBW/kg (Calculated) : 74.15   Vital Signs: Temp: 98.8 F (37.1 C) (12/21 0458) Temp Source: Oral (12/21 0458) BP: 96/53 mmHg (12/21 0458) Pulse Rate: 70 (12/21 0805)  Labs:  Recent Labs  08/07/15 0813  08/08/15 0621 08/08/15 1214 08/08/15 1828 08/09/15 0507  HGB 9.0*  --   --   --   --   --   HCT 29.5*  --   --   --   --   --   PLT 260  --   --   --   --   --   LABPROT 28.8*  --   --  33.0*  --  30.7*  INR 2.77*  --   --  3.32*  --  3.01*  CREATININE 1.36*  --   --   --   --   --   TROPONINI 0.06*  < > 1.10* 0.86* 0.77*  --   < > = values in this interval not displayed.  Estimated Creatinine Clearance: 55.9 mL/min (by C-G formula based on Cr of 1.36).   Medical History: Past Medical History  Diagnosis Date  . Type 2 diabetes mellitus ([place]) 2007  . Essential hypertension   . Gout   . Hypercholesteremia   . Peripheral vascular disease ([place])     Lower extremity PCI/stenting  . COPD (chronic obstructive pulmonary disease) ([place])   . S/P aortic valve replacement 1990    a. St. Jude  . Chronic back pain   . Dysphagia   . Neuromuscular disorder ([place])   . Peripheral neuropathy ([place])   . Critical lower limb ischemia   . CAD (coronary artery disease)     a. 05/13/14 [name] s/p overlapping DESx2 to SVG to RCA. b.  s/p CABG '90 with redo '94 & stent to RCA SVG in 2005  . Chronic toe ulcer ([place])     a. Left foot  . Chronic systolic heart failure ([place])   . History of kidney stones   . GERD (gastroesophageal reflux disease)   . Arthritis   . Sleep apnea   . Myocardial infarction ([place])   . GI bleeding 05/31/2015    Source not identified.  . Acute blood loss anemia 02/2015 & 05/2015    Hemoglobin 5.5 on 05/31/15; status post transfusion.    Medications:  Prescriptions  prior to admission  Medication Sig Dispense Refill Last Dose  . albuterol (PROVENTIL) 4 MG tablet Take 4 mg by mouth 3 (three) times daily.   08/06/2015 at Unknown time  . albuterol-ipratropium (COMBIVENT) 18-103 MCG/ACT inhaler Inhale 1 puff into the lungs 4 (four) times daily. Coughing/ Shortness of Breath   08/06/2015 at Unknown time  . allopurinol (ZYLOPRIM) 300 MG tablet Take 300 mg by mouth daily.   08/06/2015 at Unknown time  . cholecalciferol (VITAMIN D) 1000 UNITS tablet Take 2,000 Units by mouth daily.   08/06/2015 at Unknown time  . clopidogrel (PLAVIX) 75 MG tablet Take 75 mg by mouth daily.   08/06/2015 at Unknown time  . Emollient (EUCERIN) lotion Apply 10 mLs topically as needed for dry skin.   unknown  . fenofibrate (TRICOR) 145 MG tablet TAKE 1 TABLET BY MOUTH ONCE DAILY FOR CHOLESTEROL. 30 tablet 0 08/06/2015 at Unknown time  . ferrous sulfate 325 (65 FE) MG EC tablet Take 1  tablet (325 mg total) by mouth daily with breakfast.   08/06/2015 at Unknown time  . fish oil-omega-3 fatty acids 1000 MG capsule Take 1 capsule (1 g total) by mouth 2 (two) times daily.   08/06/2015 at Unknown time  . furosemide (LASIX) 80 MG tablet Take 80 mg by mouth daily.   08/06/2015 at Unknown time  . glimepiride (AMARYL) 1 MG tablet Take 1 mg by mouth 2 (two) times daily.   08/06/2015 at Unknown time  . isosorbide mononitrate (IMDUR) 60 MG 24 hr tablet Take 1 tablet (60 mg total) by mouth daily. 90 tablet 3 08/06/2015 at Unknown time  . metoprolol succinate (TOPROL-XL) 50 MG 24 hr tablet Take 50 mg by mouth daily.   08/06/2015 at 0830  . NITROSTAT 0.4 MG SL tablet PLACE 1 TAB UNDER TONGUE EVERY 5 MIN IF NEEDED FOR CHEST PAIN. MAY USE 3 TIMES.NO RELIEF CALL 911. 25 tablet 4 08/07/2015 at Unknown time  . oxyCODONE-acetaminophen (PERCOCET) 10-325 MG per tablet Take 1 tablet by mouth every 4 (four) hours as needed for pain. (Patient taking differently: Take 1 tablet by mouth every 3 (three) hours as needed  for pain. ) 30 tablet 0 08/06/2015 at Unknown time  . pantoprazole (PROTONIX) 40 MG tablet Take 1 tablet (40 mg total) by mouth 2 (two) times daily before a meal. 60 tablet 2 08/06/2015 at Unknown time  . potassium chloride SA (K-DUR,KLOR-CON) 20 MEQ tablet Take 20 mEq by mouth daily.     08/06/2015 at Unknown time  . pregabalin (LYRICA) 50 MG capsule Take one capsule by mouth twice daily for pains (Patient taking differently: Take 50 mg by mouth 3 (three) times daily. ) 60 capsule 5 08/06/2015 at Unknown time  . ramipril (ALTACE) 2.5 MG capsule Take 1 capsule (2.5 mg total) by mouth 2 (two) times daily. (Patient taking differently: Take 2.5-5 mg by mouth 2 (two) times daily. Takes '5mg'$  capsule daily in the morning and takes 2.'5mg'$  capsule at bedtime) 60 capsule 3 08/06/2015 at Unknown time  . rosuvastatin (CRESTOR) 20 MG tablet TAKE (1) TABLET BY MOUTH AT BEDTIME FOR CHOLESTEROL. 30 tablet 0 08/06/2015 at Unknown time  . sitaGLIPtin (JANUVIA) 50 MG tablet Take 50 mg by mouth daily.   08/06/2015 at Unknown time  . sucralfate (CARAFATE) 1 GM/10ML suspension Take 10 mLs (1 g total) by mouth 4 (four) times daily -  with meals and at bedtime. 420 mL 0 08/06/2015 at Unknown time  . vitamin C (ASCORBIC ACID) 500 MG tablet Take 500 mg by mouth daily.   08/06/2015 at Unknown time  . warfarin (COUMADIN) 5 MG tablet Take 1 tablet by mouth daily or as directed by coumadin clinic. (Patient taking differently: Take 2.5-5 mg by mouth. 2.'5mg'$  on Mondays and Fridays only. Takes '5mg'$  on all other day) 30 tablet 1 08/06/2015 at 2030    Assessment: 74 yo man to continue coumadin for mechanical valve.  He has had recent admissions for GI bleeds.  He is admitted for evaluation of chest pain.  INR is therapeutic.  Pt has not received any Coumadin in 2 days.  No bleeding reported.  Goal of Therapy:  INR 2.5-3.5   Plan:  Coumadin 2.'5mg'$  po today x 1 (to discourage INR drop below goal) Daily PT/INR Monitor CBC, s/sx of  bleeding complications  Thanks for allowing pharmacy to be a part of this patient's care.  Hart Robinsons, PharmD Clinical Pharmacist 08/09/2015,9:51 AM

## 2015-08-09 NOTE — Progress Notes (Signed)
Inpatient Diabetes Program Recommendations  AACE/ADA: New Consensus Statement on Inpatient Glycemic Control (2015)  Target Ranges:  Prepandial:   less than 140 mg/dL      Peak postprandial:   less than 180 mg/dL (1-2 hours)      Critically ill patients:  140 - 180 mg/dL  Results for Tanner Pena, Tanner Pena (MRN [id number]) as of 08/09/2015 08:40  Ref. Range 08/08/2015 07:41 08/08/2015 11:56 08/08/2015 16:58 08/08/2015 21:19 08/09/2015 08:12  Glucose-Capillary Latest Ref Range: 65-99 mg/dL 160 (H) 268 (H) 158 (H) 211 (H) 145 (H)   Review of Glycemic Control  Current orders for Inpatient glycemic control: Amaryl 1 mg BID, Tradjenta 5 mg daily, Novolog 0-9 units TID with meals, Novolog 0-5 units HS, Novolog 3 units TID with meals for meal coverage  Inpatient Diabetes Program Recommendations: Insulin - Meal Coverage: Please consider increasing meal coverage to Novolog 6 units TID with meals for meal coverage.   Thanks, Barnie Alderman, RN, MSN, CDE Diabetes Coordinator Inpatient Diabetes Program [phone] (Team Pager from [place] to [place]) [phone] ([facility] office) [phone] [facility] office) [phone] [facility] office)

## 2015-08-09 NOTE — Progress Notes (Signed)
[facility] PROGRESS NOTE  Tanner Pena PRF:[id number] DOB: October 10, 1940    PCP:   [name], MD   HPI: Tanner Pena is an 74 y.o. male with hx of DM, PVD, HTN, Gout, COPD, sleep apnea, GERD, hx of GI bleed July 2016, s/p Mechanical AVR on Coumadin for many years, admitted for chest pain, found to have NSTEMI.  Cardiology has been following him and he has been medically treated as with his GI Bleed, and inability to commit to antiplatelet therapy, he was not a candidate for cath and stents.  He has improved, and only has twitches of CP.  His troponin was found to be peaked at 1.25.  He is anxious to go home.   Rewiew of Systems:  Constitutional: Negative for malaise, fever and chills. No significant weight loss or weight gain Eyes: Negative for eye pain, redness and discharge, diplopia, visual changes, or flashes of light. ENMT: Negative for ear pain, hoarseness, nasal congestion, sinus pressure and sore throat. No headaches; tinnitus, drooling, or problem swallowing. Cardiovascular: Negative for  palpitations, diaphoresis, dyspnea and peripheral edema. ; No orthopnea, PND Respiratory: Negative for cough, hemoptysis, wheezing and stridor. No pleuritic chestpain. Gastrointestinal: Negative for nausea, vomiting, diarrhea, constipation, abdominal pain, melena, blood in stool, hematemesis, jaundice and rectal bleeding.    Genitourinary: Negative for frequency, dysuria, incontinence,flank pain and hematuria; Musculoskeletal: Negative for back pain and neck pain. Negative for swelling and trauma.;  Skin: . Negative for pruritus, rash, abrasions, bruising and skin lesion.; ulcerations Neuro: Negative for headache, lightheadedness and neck stiffness. Negative for weakness, altered level of consciousness , altered mental status, extremity weakness, burning feet, involuntary movement, seizure and syncope.  Psych: negative for anxiety, depression, insomnia, tearfulness, panic attacks,  hallucinations, paranoia, suicidal or homicidal ideation    Past Medical History  Diagnosis Date  . Type 2 diabetes mellitus ([place]) 2007  . Essential hypertension   . Gout   . Hypercholesteremia   . Peripheral vascular disease ([place])     Lower extremity PCI/stenting  . COPD (chronic obstructive pulmonary disease) ([place])   . S/P aortic valve replacement 1990    a. St. Jude  . Chronic back pain   . Dysphagia   . Neuromuscular disorder ([place])   . Peripheral neuropathy ([place])   . Critical lower limb ischemia   . CAD (coronary artery disease)     a. 05/13/14 [name] s/p overlapping DESx2 to SVG to RCA. b.  s/p CABG '90 with redo '94 & stent to RCA SVG in 2005  . Chronic toe ulcer ([place])     a. Left foot  . Chronic systolic heart failure ([place])   . History of kidney stones   . GERD (gastroesophageal reflux disease)   . Arthritis   . Sleep apnea   . Myocardial infarction ([place])   . GI bleeding 05/31/2015    Source not identified.  . Acute blood loss anemia 02/2015 & 05/2015    Hemoglobin 5.5 on 05/31/15; status post transfusion.    Past Surgical History  Procedure Laterality Date  . Aortic valve replacement  1990    St. Jude  . Rotator cuff repair      right  . Cataract extraction      bilateral  . Coronary stent placement  2005    RCA vein graft A 3.0x13.0 TAXUS stent was then placed int he vessel a Viva 3.0x4.0 (perfusion balloon was made ready it was placed through the entire lenght of the stent  .  Peripheral vascular procedures lower extremities      Right external iliac  artery PTA and stenting as well as bilateral SFA intervention remotely. Repeat procedures in 2011 bilaterally  . Coronary artery bypass graft  1994    6 vessels  . [name]  06/13/2011    Procedure: Venia Minks DILATION;  Surgeon: [name], MD;  Location: [facility];  Service: Endoscopy;  Laterality: N/A;  Dilated to 56.   . Angioplasty illiac artery    . Back surgery  [id number]    2  . Eye surgery    .  Amputation Left 07/13/2014    Procedure: Transmetatarsal Amputation;  Surgeon: [name], MD;  Location: [place];  Service: Orthopedics;  Laterality: Left;  . Lower extremity angiogram N/A 02/15/2013    Procedure: LOWER EXTREMITY ANGIOGRAM;  Surgeon: [name], MD;  Location: [facility] CATH LAB;  Service: Cardiovascular;  Laterality: N/A;  . Left heart catheterization with coronary angiogram N/A 05/11/2014    Procedure: LEFT HEART CATHETERIZATION WITH CORONARY ANGIOGRAM;  Surgeon: [name], MD;  Location: [facility] CATH LAB;  Service: Cardiovascular;  Laterality: N/A;  . Percutaneous coronary stent intervention (pci-s) N/A 05/13/2014    Procedure: PERCUTANEOUS CORONARY STENT INTERVENTION (PCI-S);  Surgeon: [name], MD;  Location: [facility] CATH LAB;  Service: Cardiovascular;  Laterality: N/A;  . Lower extremity angiogram N/A 06/06/2014    Procedure: LOWER EXTREMITY ANGIOGRAM;  Surgeon: [name], MD;  Location: [facility] CATH LAB;  Service: Cardiovascular;  Laterality: N/A;  . Amputation Left 08/20/2014    Procedure: Revision Transmetatarsal Amputation versus Below Knee Amputation;  Surgeon: [name], MD;  Location: [place];  Service: Orthopedics;  Laterality: Left;  . Stump revision Left 09/23/2014    Procedure: Revision Left Below Knee Amputation;  Surgeon: [name], MD;  Location: [place];  Service: Orthopedics;  Laterality: Left;  . Stump revision Left 10/13/2014    Procedure: REVISION LEFT BELOW KNEE AMPUTATION STUMP;  Surgeon: [name], MD;  Location: [facility];  Service: Orthopedics;  Laterality: Left;  . Esophagogastroduodenoscopy N/A 02/09/2015    DR. Schooler: Normal EGD  . Esophagogastroduodenoscopy  05/2011    Dr. [name]: s/p esophageal dilation, antral erosions/nodularity with benign biopsies  . Colonoscopy  05/2011    Dr. [name]: benign rectal polyp, left sided tics, ascending colonic ulcers (path c/w ischemia)  . Colonoscopy with propofol N/A 06/01/2015     RMR: normal appearing rectal mucosa. Scattered left-sided diverticula. the remainder of the colonic mucosa appeared normal. the distal 5 cm of terminal ileal mucosa also appeared normal. retroflexion was performed.   Freda Munro capsule study N/A 06/08/2015    Procedure: GIVENS CAPSULE STUDY;  Surgeon: [name], MD;  Location: [facility];  Service: Endoscopy;  Laterality: N/A;  0700   . Esophagogastroduodenoscopy (egd) with propofol N/A 06/20/2015    Procedure: ESOPHAGOGASTRODUODENOSCOPY (EGD) WITH PROPOFOL;  Surgeon: Danie Binder, MD;  Location: [facility];  Service: Endoscopy;  Laterality: N/A;  . Esophageal biopsy N/A 06/20/2015    Procedure: BIOPSY;  Surgeon: Danie Binder, MD;  Location: [facility];  Service: Endoscopy;  Laterality: N/A;    Medications:  HOME MEDS: Prior to Admission medications   Medication Sig Start Date End Date Taking? Authorizing Provider  albuterol (PROVENTIL) 4 MG tablet Take 4 mg by mouth 3 (three) times daily. 01/20/15  Yes Historical Provider, MD  albuterol-ipratropium (COMBIVENT) 18-103 MCG/ACT inhaler Inhale 1 puff into the lungs 4 (four)  times daily. Coughing/ Shortness of Breath   Yes Historical Provider, MD  allopurinol (ZYLOPRIM) 300 MG tablet Take 300 mg by mouth daily.   Yes Historical Provider, MD  cholecalciferol (VITAMIN D) 1000 UNITS tablet Take 2,000 Units by mouth daily.   Yes Historical Provider, MD  clopidogrel (PLAVIX) 75 MG tablet Take 75 mg by mouth daily. 07/21/15  Yes Historical Provider, MD  Emollient (EUCERIN) lotion Apply 10 mLs topically as needed for dry skin.   Yes Historical Provider, MD  fenofibrate (TRICOR) 145 MG tablet TAKE 1 TABLET BY MOUTH ONCE DAILY FOR CHOLESTEROL. 07/21/15  Yes [name], MD  ferrous sulfate 325 (65 FE) MG EC tablet Take 1 tablet (325 mg total) by mouth daily with breakfast. 06/02/15  Yes Rexene Alberts, MD  fish oil-omega-3 fatty acids 1000 MG capsule Take 1 capsule (1 g total) by mouth 2 (two) times daily.  01/22/13  Yes Kristin L Alvstad, RPH-CPP  furosemide (LASIX) 80 MG tablet Take 80 mg by mouth daily. 07/21/15  Yes Historical Provider, MD  glimepiride (AMARYL) 1 MG tablet Take 1 mg by mouth 2 (two) times daily.   Yes Historical Provider, MD  isosorbide mononitrate (IMDUR) 60 MG 24 hr tablet Take 1 tablet (60 mg total) by mouth daily. 07/27/15  Yes Troy Sine, MD  metoprolol succinate (TOPROL-XL) 50 MG 24 hr tablet Take 50 mg by mouth daily. 06/22/15  Yes Historical Provider, MD  NITROSTAT 0.4 MG SL tablet PLACE 1 TAB UNDER TONGUE EVERY 5 MIN IF NEEDED FOR CHEST PAIN. MAY USE 3 TIMES.NO RELIEF CALL 911. 07/20/15  Yes Eileen Stanford, PA-C  oxyCODONE-acetaminophen (PERCOCET) 10-325 MG per tablet Take 1 tablet by mouth every 4 (four) hours as needed for pain. Patient taking differently: Take 1 tablet by mouth every 3 (three) hours as needed for pain.  09/23/14  Yes [name], MD  pantoprazole (PROTONIX) 40 MG tablet Take 1 tablet (40 mg total) by mouth 2 (two) times daily before a meal. 06/23/15  Yes Estela Leonie Green, MD  potassium chloride SA (K-DUR,KLOR-CON) 20 MEQ tablet Take 20 mEq by mouth daily.     Yes Historical Provider, MD  pregabalin (LYRICA) 50 MG capsule Take one capsule by mouth twice daily for pains Patient taking differently: Take 50 mg by mouth 3 (three) times daily.  08/23/14  Yes Mahima Pandey, MD  ramipril (ALTACE) 2.5 MG capsule Take 1 capsule (2.5 mg total) by mouth 2 (two) times daily. Patient taking differently: Take 2.5-5 mg by mouth 2 (two) times daily. Takes '5mg'$  capsule daily in the morning and takes 2.'5mg'$  capsule at bedtime 06/02/15  Yes Rexene Alberts, MD  rosuvastatin (CRESTOR) 20 MG tablet TAKE (1) TABLET BY MOUTH AT BEDTIME FOR CHOLESTEROL. 07/21/15  Yes [name], MD  sitaGLIPtin (JANUVIA) 50 MG tablet Take 50 mg by mouth daily.   Yes Historical Provider, MD  sucralfate (CARAFATE) 1 GM/10ML suspension Take 10 mLs (1 g total) by mouth 4 (four) times daily  -  with meals and at bedtime. 07/28/15  Yes Carmin Muskrat, MD  vitamin C (ASCORBIC ACID) 500 MG tablet Take 500 mg by mouth daily.   Yes Historical Provider, MD  warfarin (COUMADIN) 5 MG tablet Take 1 tablet by mouth daily or as directed by coumadin clinic. Patient taking differently: Take 2.5-5 mg by mouth. 2.'5mg'$  on Mondays and Fridays only. Takes '5mg'$  on all other day 06/02/15  Yes Rexene Alberts, MD     Allergies:  No Known Allergies  Social History:   reports that he quit smoking about 15 months ago. His smoking use included Cigarettes. He started smoking about 62 years ago. He has a 55 pack-year smoking history. His smokeless tobacco use includes Chew. He reports that he does not drink alcohol or use illicit drugs.  Family History: Family History  Problem Relation Age of Onset  . Colon cancer Neg Hx   . Liver disease Neg Hx   . Diabetes Father   . Heart failure Other      Physical Exam: Filed Vitals:   08/09/15 0458 08/09/15 0805 08/09/15 1011 08/09/15 1313  BP: 96/53  119/67 105/42  Pulse:  70 60 56  Temp: 98.8 F (37.1 C)   98.6 F (37 C)  TempSrc: Oral   Oral  Resp: '20 18  18  '$ Height:      Weight:      SpO2: 97% 97%  97%   Blood pressure 105/42, pulse 56, temperature 98.6 F (37 C), temperature source Oral, resp. rate 18, height 5' 10.5" (1.791 m), weight 96.163 kg (212 lb), SpO2 97 %.  GEN:  Pleasant  patient lying in the stretcher in no acute distress; cooperative with exam. PSYCH:  alert and oriented x4; does not appear anxious or depressed; affect is appropriate. HEENT: Mucous membranes pink and anicteric; PERRLA; EOM intact; no cervical lymphadenopathy nor thyromegaly or carotid bruit; no JVD; There were no stridor. Neck is very supple. Breasts:: Not examined CHEST WALL: No tenderness CHEST: Normal respiration, clear to auscultation bilaterally.  HEART: Regular rate and rhythm.  There are no murmur, rub, or gallops.   BACK: No kyphosis or scoliosis; no CVA  tenderness ABDOMEN: soft and non-tender; no masses, no organomegaly, normal abdominal bowel sounds; no pannus; no intertriginous candida. There is no rebound and no distention. Rectal Exam: Not done EXTREMITIES: No bone or joint deformity; age-appropriate arthropathy of the hands and knees; no edema; no ulcerations.  There is no calf tenderness. Genitalia: not examined PULSES: 2+ and symmetric SKIN: Normal hydration no rash or ulceration CNS: Cranial nerves 2-12 grossly intact no focal lateralizing neurologic deficit.  Speech is fluent; uvula elevated with phonation, facial symmetry and tongue midline. DTR are normal bilaterally, cerebella exam is intact, barbinski is negative and strengths are equaled bilaterally.  No sensory loss.   Labs on Admission:  Basic Metabolic Panel:  Recent Labs Lab 08/07/15 0813  NA 140  K 3.5  CL 107  CO2 25  GLUCOSE 300*  BUN 27*  CREATININE 1.36*  CALCIUM 9.9    Recent Labs Lab 08/07/15 0813  WBC 7.2  HGB 9.0*  HCT 29.5*  MCV 83.3  PLT 260   Cardiac Enzymes:  Recent Labs Lab 08/07/15 2043 08/07/15 2320 08/08/15 0621 08/08/15 1214 08/08/15 1828  TROPONINI 1.11* 1.25* 1.10* 0.86* 0.77*    CBG:  Recent Labs Lab 08/08/15 1156 08/08/15 1658 08/08/15 2119 08/09/15 0812 08/09/15 1136  GLUCAP 268* 158* 211* 145* 317*    EKG: Independently reviewed.    Assessment/Plan Present on Admission:  . NSTEMI (non-ST elevated myocardial infarction) ([place]) . Tobacco abuse . Hyperlipidemia with target LDL less than 70 . Type 2 diabetes mellitus with stage 1 chronic kidney disease, without long-term current use of insulin ([place]) . Cardiomyopathy, ischemic - EF 25-30% by echo 5/14   NSTEMI -Troponins maxed out at 1.25. -Medical management only given severe GI bleed when placed on antiplatelet therapy limits placement of potential stent. -Imdur was increased to  60 mg on December 8, however patient only took his first increased dose on  admission.  -Has not had further chest pain since admission. -Cardiology following.  History of aortic valve replacement -Continue Coumadin.  Ischemic cardiomyopathy -Known ejection fraction of 25-30%. -Continue beta blocker, lisinopril, Imdur, statin  Hyperlipidemia -Continue statin  Type 2 diabetes -CBGs elevated, A1c 7.0. -Follow and adjust regimen as needed.  Recent history of GI bleed -With erosive gastritis on EGD, continue Protonix and sucralfate  Code Status: Full code Family Communication: Patient only  Disposition Plan: Consider discharge home in 24-48 hours if no recurrent chest pain   Other plans as per orders.  Code Status: FULL CODE>    Tanner Falconer, MD.  FACP [facility] Pager [phone] 7pm to 7am.  08/09/2015, 4:39 PM

## 2015-08-09 NOTE — Care Management Note (Signed)
Case Management Note  Patient Details  Name: Tanner Pena MRN: [id number] Date of Birth: Jan 14, 1941  Subjective/Objective:                  Pt admitted from home with NSTEMI. Pt lives with nephew at home and will return home at discharge. Pt has a w/c and cane for home use and also uses a prosthesis. Pt is active with [facility].  Action/Plan: No CM needs noted.  Expected Discharge Date:                  Expected Discharge Plan:  Home/Self Care  In-House Referral:  NA  Discharge planning Services  CM Consult  Post Acute Care Choice:  NA Choice offered to:  NA  DME Arranged:    DME Agency:     HH Arranged:    HH Agency:     Status of Service:  Completed, signed off  Medicare Important Message Given:    Date Medicare IM Given:    Medicare IM give by:    Date Additional Medicare IM Given:    Additional Medicare Important Message give by:     If discussed at [place] of Stay Meetings, dates discussed:    Additional Comments:  Joylene Draft, RN 08/09/2015, 11:56 AM

## 2015-08-10 LAB — GLUCOSE, CAPILLARY
GLUCOSE-CAPILLARY: 145 mg/dL — AB (ref 65–99)
GLUCOSE-CAPILLARY: 212 mg/dL — AB (ref 65–99)

## 2015-08-10 LAB — CBC
HCT: 26.3 % — ABNORMAL LOW (ref 39.0–52.0)
HEMOGLOBIN: 8.1 g/dL — AB (ref 13.0–17.0)
MCH: 26 pg (ref 26.0–34.0)
MCHC: 30.8 g/dL (ref 30.0–36.0)
MCV: 84.3 fL (ref 78.0–100.0)
Platelets: 250 10*3/uL (ref 150–400)
RBC: 3.12 MIL/uL — ABNORMAL LOW (ref 4.22–5.81)
RDW: 18.1 % — AB (ref 11.5–15.5)
WBC: 9 10*3/uL (ref 4.0–10.5)

## 2015-08-10 LAB — PROTIME-INR
INR: 2.85 — AB (ref 0.00–1.49)
PROTHROMBIN TIME: 29.5 s — AB (ref 11.6–15.2)

## 2015-08-10 MED ORDER — WARFARIN SODIUM 5 MG PO TABS: 2.5000 mg | ORAL_TABLET | Freq: Once | ORAL | Status: DC

## 2015-08-10 MED ORDER — WARFARIN SODIUM 2.5 MG PO TABS: 2.5000 mg | ORAL_TABLET | Freq: Every day | ORAL | Status: DC

## 2015-08-10 NOTE — Progress Notes (Signed)
ANTICOAGULATION CONSULT NOTE - follow up  Pharmacy Consult for coumadin Indication: mechanical vavle  No Known Allergies  Patient Measurements: Height: 5' 10.5" (179.1 cm) Weight: 212 lb (96.163 kg) IBW/kg (Calculated) : 74.15   Vital Signs: Temp: 98.3 F (36.8 C) (12/22 0547) Temp Source: Oral (12/22 0547) BP: 100/60 mmHg (12/22 0547) Pulse Rate: 60 (12/22 0547)  Labs:  Recent Labs  08/08/15 0621 08/08/15 1214 08/08/15 1828 08/09/15 0507 08/10/15 0523  HGB  --   --   --   --  8.1*  HCT  --   --   --   --  26.3*  PLT  --   --   --   --  250  LABPROT  --  33.0*  --  30.7* 29.5*  INR  --  3.32*  --  3.01* 2.85*  TROPONINI 1.10* 0.86* 0.77*  --   --     Estimated Creatinine Clearance: 55.9 mL/min (by C-G formula based on Cr of 1.36).   Medical History: Past Medical History  Diagnosis Date  . Type 2 diabetes mellitus ([place]) 2007  . Essential hypertension   . Gout   . Hypercholesteremia   . Peripheral vascular disease ([name])     Lower extremity PCI/stenting  . COPD (chronic obstructive pulmonary disease) ([place])   . S/P aortic valve replacement 1990    a. St. Jude  . Chronic back pain   . Dysphagia   . Neuromuscular disorder ([place])   . Peripheral neuropathy ([place])   . Critical lower limb ischemia   . CAD (coronary artery disease)     a. 05/13/14 [name] s/p overlapping DESx2 to SVG to RCA. b.  s/p CABG '90 with redo '94 & stent to RCA SVG in 2005  . Chronic toe ulcer ([place])     a. Left foot  . Chronic systolic heart failure ([place])   . History of kidney stones   . GERD (gastroesophageal reflux disease)   . Arthritis   . Sleep apnea   . Myocardial infarction ([place])   . GI bleeding 05/31/2015    Source not identified.  . Acute blood loss anemia 02/2015 & 05/2015    Hemoglobin 5.5 on 05/31/15; status post transfusion.    Medications:  Prescriptions prior to admission  Medication Sig Dispense Refill Last Dose  . albuterol (PROVENTIL) 4 MG tablet Take 4 mg by mouth 3  (three) times daily.   08/06/2015 at Unknown time  . albuterol-ipratropium (COMBIVENT) 18-103 MCG/ACT inhaler Inhale 1 puff into the lungs 4 (four) times daily. Coughing/ Shortness of Breath   08/06/2015 at Unknown time  . allopurinol (ZYLOPRIM) 300 MG tablet Take 300 mg by mouth daily.   08/06/2015 at Unknown time  . cholecalciferol (VITAMIN D) 1000 UNITS tablet Take 2,000 Units by mouth daily.   08/06/2015 at Unknown time  . clopidogrel (PLAVIX) 75 MG tablet Take 75 mg by mouth daily.   08/06/2015 at Unknown time  . Emollient (EUCERIN) lotion Apply 10 mLs topically as needed for dry skin.   unknown  . fenofibrate (TRICOR) 145 MG tablet TAKE 1 TABLET BY MOUTH ONCE DAILY FOR CHOLESTEROL. 30 tablet 0 08/06/2015 at Unknown time  . ferrous sulfate 325 (65 FE) MG EC tablet Take 1 tablet (325 mg total) by mouth daily with breakfast.   08/06/2015 at Unknown time  . fish oil-omega-3 fatty acids 1000 MG capsule Take 1 capsule (1 g total) by mouth 2 (two) times daily.   08/06/2015 at Unknown time  .  furosemide (LASIX) 80 MG tablet Take 80 mg by mouth daily.   08/06/2015 at Unknown time  . glimepiride (AMARYL) 1 MG tablet Take 1 mg by mouth 2 (two) times daily.   08/06/2015 at Unknown time  . isosorbide mononitrate (IMDUR) 60 MG 24 hr tablet Take 1 tablet (60 mg total) by mouth daily. 90 tablet 3 08/06/2015 at Unknown time  . metoprolol succinate (TOPROL-XL) 50 MG 24 hr tablet Take 50 mg by mouth daily.   08/06/2015 at 0830  . NITROSTAT 0.4 MG SL tablet PLACE 1 TAB UNDER TONGUE EVERY 5 MIN IF NEEDED FOR CHEST PAIN. MAY USE 3 TIMES.NO RELIEF CALL 911. 25 tablet 4 08/07/2015 at Unknown time  . oxyCODONE-acetaminophen (PERCOCET) 10-325 MG per tablet Take 1 tablet by mouth every 4 (four) hours as needed for pain. (Patient taking differently: Take 1 tablet by mouth every 3 (three) hours as needed for pain. ) 30 tablet 0 08/06/2015 at Unknown time  . pantoprazole (PROTONIX) 40 MG tablet Take 1 tablet (40 mg total) by  mouth 2 (two) times daily before a meal. 60 tablet 2 08/06/2015 at Unknown time  . potassium chloride SA (K-DUR,KLOR-CON) 20 MEQ tablet Take 20 mEq by mouth daily.     08/06/2015 at Unknown time  . pregabalin (LYRICA) 50 MG capsule Take one capsule by mouth twice daily for pains (Patient taking differently: Take 50 mg by mouth 3 (three) times daily. ) 60 capsule 5 08/06/2015 at Unknown time  . ramipril (ALTACE) 2.5 MG capsule Take 1 capsule (2.5 mg total) by mouth 2 (two) times daily. (Patient taking differently: Take 2.5-5 mg by mouth 2 (two) times daily. Takes '5mg'$  capsule daily in the morning and takes 2.'5mg'$  capsule at bedtime) 60 capsule 3 08/06/2015 at Unknown time  . rosuvastatin (CRESTOR) 20 MG tablet TAKE (1) TABLET BY MOUTH AT BEDTIME FOR CHOLESTEROL. 30 tablet 0 08/06/2015 at Unknown time  . sitaGLIPtin (JANUVIA) 50 MG tablet Take 50 mg by mouth daily.   08/06/2015 at Unknown time  . sucralfate (CARAFATE) 1 GM/10ML suspension Take 10 mLs (1 g total) by mouth 4 (four) times daily -  with meals and at bedtime. 420 mL 0 08/06/2015 at Unknown time  . vitamin C (ASCORBIC ACID) 500 MG tablet Take 500 mg by mouth daily.   08/06/2015 at Unknown time  . warfarin (COUMADIN) 5 MG tablet Take 1 tablet by mouth daily or as directed by coumadin clinic. (Patient taking differently: Take 2.5-5 mg by mouth. 2.'5mg'$  on Mondays and Fridays only. Takes '5mg'$  on all other day) 30 tablet 1 08/06/2015 at 2030   Assessment: 74 yo man to continue coumadin for mechanical valve.  He has had recent admissions for GI bleeds.  He is admitted for evaluation of chest pain.  INR is therapeutic.  No bleeding reported.  Goal of Therapy:  INR 2.5-3.5   Plan:  Coumadin 2.'5mg'$  po today x 1 (to discourage INR drop below goal) Daily PT/INR Monitor CBC, s/sx of bleeding complications  Thanks for allowing pharmacy to be a part of this patient's care.  Hart Robinsons, PharmD Clinical Pharmacist 08/10/2015,9:06 AM

## 2015-08-10 NOTE — Progress Notes (Signed)
Pt discharged home today per Dr. Le.  Pt's IV site D/C'd and WDL.  Pt's VSS.  Pt provided with home medication list, discharge instructions and prescriptions.  Verbalized understanding.  Pt left floor via WC in stable condition accompanied by NT. 

## 2015-08-10 NOTE — Care Management Important Message (Signed)
Important Message  Patient Details  Name: Tanner Pena MRN: [id number] Date of Birth: 03-11-1941   Medicare Important Message Given:  Yes    Joylene Draft, RN 08/10/2015, 11:39 AM

## 2015-08-10 NOTE — Care Management Note (Signed)
Case Management Note  Patient Details  Name: [name] MRN: [id number] Date of Birth: 1941-03-01  Subjective/Objective:                    Action/Plan:   Expected Discharge Date:                  Expected Discharge Plan:  Home/Self Care  In-House Referral:  NA  Discharge planning Services  CM Consult  Post Acute Care Choice:  NA Choice offered to:  NA  DME Arranged:    DME Agency:     HH Arranged:    [place] Agency:     Status of Service:  Completed, signed off  Medicare Important Message Given:  Yes Date Medicare IM Given:    Medicare IM give by:    Date Additional Medicare IM Given:    Additional Medicare Important Message give by:     If discussed at [place] of Stay Meetings, dates discussed:    Additional Comments: Pt discharged home today. No CM needs noted. Christinia Gully [place], RN 08/10/2015, 11:40 AM

## 2015-08-10 NOTE — Discharge Summary (Signed)
Physician Discharge Summary  Tanner Pena EXB:[id number] DOB: 05/26/1941 DOA: 08/07/2015  PCP: [name], MD  Admit date: 08/07/2015 Discharge date: 08/10/2015  Time spent: 35 minutes  Recommendations for Outpatient Follow-up:  1. Follow up with PCP in one week. 2. Follow up with Cardiology in one week.    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  No bleeding.  No Chest pain.   Diet recommendation: Cardiac diet.   Filed Weights   08/07/15 0757  Weight: 96.163 kg (212 lb)    History of present illness: patient was admitted for chest discomfort, ended up being a NSTEMI, by Dr Jerilee Hoh on Aug 07, 2015.  As per her H and P:  " This is a 74 year old man well known to me for a couple of recent prolonged hospitalizations. He has a history significant for coronary artery disease, he is status post a mechanical aortic valve replacement is maintained on chronic anticoagulation with Coumadin. He has had 2 recent hospitalizations in October and November for GI bleeding. During one of those hospitalizations an EGD was performed that showed erosive gastritis. By GI recommendations he was taken off aspirin and Plavix. During that hospitalization in November he also suffered a non-ST elevated MI and the decision was made to not perform a cardiac catheterization as we could not commit him to long-term antiplatelet therapy given his GI bleeding. He states that ever since his discharge in November he has had anginal chest pain. He recently  saw Dr. [name] at which time his Imdur was increased. His pain is responsive to nitroglycerin. Today he went to visit a doctor for his pain medication refill and while there developed chest pain at which time EMS was called and transported him to the hospital. Interestingly on 08/03/2015 he had a stress test that was a low risk study with inferior and inferolateral scar without ischemia. In the ED his second troponin has risen to 0.24. Case has been discussed with cardiology and decision has been made to admit him for medical management of his acute coronary syndrome.   Hospital Course: Tanner Pena is an 74 y.o. male with hx of DM, PVD, HTN, Gout, COPD, sleep apnea, GERD, hx of GI bleed July 2016, s/p Mechanical AVR on Coumadin for many years, admitted for chest pain, found to have NSTEMI. Cardiology has been following him and he has been medically treated as with his GI Bleed, and the inability to commit to antiplatelet therapy, he was not a candidate for cath and stents. He has improved, and only has twitches of CP. His troponin was found to be peaked at 1.25. His Imdur was increased to '60mg'$  on Dec 8. For his ischemic CMP with EF 25 to 30 percents, he was continued on his betablocker, Lisinorpil, Imdur and Statin.  For his DM, his A1c was reported at 7. He has no further any evidence of GI bleeding, and was continued on his meds.  He is anxious to go home, and is stable for discharge.  He will follow up with  his GI and his cardiologist next week.  He will be continued on his home meds and follow up with his PCP next week.  INR needs to be done next week as well.  Again, he is off DAPT due to his GI bleed, but continued on his Coumadin for mechanical AVR.  Thank you for asking me to follow him.     Consultations:  GI and Cardiology.   Discharge Exam: Filed Vitals:   08/09/15 2053 08/10/15 0547  BP: 116/60 100/60  Pulse: 60 60  Temp: 98.2 F (36.8 C) 98.3 F (36.8 C)  Resp: 20 18      Discharge Instructions   Discharge Instructions    Diet - low sodium heart healthy    Complete by:  As directed      Discharge instructions    Complete by:  As directed   You will need to follow up with your PCP next week, as you will need to have your Coumadin level check as soon as possible.     Increase activity slowly    Complete by:  As directed           Current Discharge Medication List    CONTINUE these medications which have CHANGED   Details  warfarin (COUMADIN) 2.5 MG tablet Take 1 tablet (2.5 mg total) by mouth daily. Qty: 30 tablet, Refills: 1      CONTINUE these medications which have NOT CHANGED   Details  albuterol (PROVENTIL) 4 MG tablet Take 4 mg by mouth 3 (three) times daily.    albuterol-ipratropium (COMBIVENT) 18-103 MCG/ACT inhaler Inhale 1 puff into the lungs 4 (four) times daily. Coughing/ Shortness of Breath    allopurinol (ZYLOPRIM) 300 MG tablet Take 300 mg by mouth daily.    cholecalciferol (VITAMIN D) 1000 UNITS tablet Take 2,000 Units by mouth daily.    fenofibrate (TRICOR) 145 MG tablet TAKE 1 TABLET BY MOUTH ONCE DAILY FOR CHOLESTEROL. Qty: 30 tablet, Refills: 0    ferrous sulfate 325 (65 FE) MG EC tablet Take 1 tablet (325 mg total) by mouth daily with breakfast.    fish oil-omega-3 fatty acids 1000 MG capsule Take 1 capsule (1 g total) by mouth 2 (two) times daily.    furosemide (LASIX) 80 MG tablet Take 80 mg by mouth daily.    glimepiride (AMARYL) 1 MG tablet Take 1 mg by mouth 2 (two) times daily.    isosorbide mononitrate (IMDUR) 60 MG 24 hr tablet Take 1 tablet (60 mg total) by mouth daily. Qty: 90 tablet, Refills: 3    metoprolol succinate (TOPROL-XL) 50 MG 24 hr tablet Take 50 mg by mouth daily.    NITROSTAT 0.4 MG SL tablet PLACE 1 TAB UNDER TONGUE EVERY 5 MIN IF NEEDED FOR CHEST PAIN. MAY USE 3 TIMES.NO RELIEF CALL 911. Qty: 25 tablet, Refills: 4    oxyCODONE-acetaminophen (PERCOCET) 10-325 MG per tablet Take 1  tablet by mouth every 4 (four) hours as needed for pain. Qty: 30 tablet, Refills: 0    pantoprazole (PROTONIX) 40 MG tablet Take 1 tablet (40 mg total) by mouth 2 (two) times daily before a meal. Qty: 60 tablet, Refills: 2    potassium chloride SA (K-DUR,KLOR-CON) 20 MEQ tablet Take 20 mEq by mouth daily.      pregabalin (LYRICA) 50 MG capsule Take one capsule by mouth twice daily for pains Qty: 60 capsule, Refills: 5    ramipril (ALTACE) 2.5 MG capsule Take 1 capsule (2.5 mg total) by  mouth 2 (two) times daily. Qty: 60 capsule, Refills: 3    rosuvastatin (CRESTOR) 20 MG tablet TAKE (1) TABLET BY MOUTH AT BEDTIME FOR CHOLESTEROL. Qty: 30 tablet, Refills: 0    sitaGLIPtin (JANUVIA) 50 MG tablet Take 50 mg by mouth daily.    sucralfate (CARAFATE) 1 GM/10ML suspension Take 10 mLs (1 g total) by mouth 4 (four) times daily -  with meals and at bedtime. Qty: 420 mL, Refills: 0    vitamin C (ASCORBIC ACID) 500 MG tablet Take 500 mg by mouth daily.      STOP taking these medications     clopidogrel (PLAVIX) 75 MG tablet      Emollient (EUCERIN) lotion        No Known Allergies    The results of significant diagnostics from this hospitalization (including imaging, microbiology, ancillary and laboratory) are listed below for reference.    Significant Diagnostic Studies: Dg Chest 2 View  08/07/2015  CLINICAL DATA:  Chest pain. EXAM: CHEST  2 VIEW COMPARISON:  07/28/2015.  05/12/2014 .  08/10/2013. FINDINGS: Prior CABG. Cardiomegaly. Pulmonary vascularity normal. Stable basilar pleural parenchymal thickening on the left noted consistent with scarring. IMPRESSION: 1. Cardiomegaly. Stable cardiomegaly. No pulmonary venous congestion. 2. Stable left base pleural parenchymal thickening consistent with scarring Electronically Signed   By: [place]   On: 08/07/2015 08:36   Dg Chest Portable 1 View  07/28/2015  CLINICAL DATA:  Acute onset chest pain.  Coronary artery disease. EXAM:  PORTABLE CHEST 1 VIEW COMPARISON:  06/15/2015 FINDINGS: Cardiomegaly remains stable. Elevation of left hemidiaphragm is unchanged. Pulmonary emphysema again demonstrated. No evidence of pulmonary infiltrate or edema. No evidence of pneumothorax or pleural effusion. Prior CABG again noted. IMPRESSION: Stable cardiomegaly and emphysema.  No acute findings. Electronically Signed   By: Earle Gell M.D.   On: 07/28/2015 20:05    Microbiology: No results found for this or any previous visit (from the past 240 hour(s)).   Labs: Basic Metabolic Panel:  Recent Labs Lab 08/07/15 0813  NA 140  K 3.5  CL 107  CO2 25  GLUCOSE 300*  BUN 27*  CREATININE 1.36*  CALCIUM 9.9   CBC:  Recent Labs Lab 08/07/15 0813 08/10/15 0523  WBC 7.2 9.0  HGB 9.0* 8.1*  HCT 29.5* 26.3*  MCV 83.3 84.3  PLT 260 250   Cardiac Enzymes:  Recent Labs Lab 08/07/15 2043 08/07/15 2320 08/08/15 0621 08/08/15 1214 08/08/15 1828  TROPONINI 1.11* 1.25* 1.10* 0.86* 0.77*   BNP: BNP (last 3 results)  Recent Labs  12/28/14 0134 02/04/15 0413 07/28/15 1944  BNP 504.3* 593.2* 156.0*    CBG:  Recent Labs Lab 08/09/15 0812 08/09/15 1136 08/09/15 1654 08/09/15 2056 08/10/15 0752  GLUCAP 145* 317* 153* 178* 145*     Signed:  Tosha Belgarde MD  FACP  [facility] 08/10/2015, 11:16 AM

## 2015-08-11 ENCOUNTER — Other Ambulatory Visit: Payer: Self-pay | Admitting: *Deleted

## 2015-08-11 NOTE — Patient Outreach (Signed)
Transition of care week #1 completed. Patient doing ok, just discharged yesterday. He states his daughter will continue to assist with medication management. He will call today and get appointment with [facility] for follow up, sees primary care next week. No issues or concerns for [facility] voiced on call.  Plan to visit as scheduled in January. Will continue weekly Transition of Care calls. Royetta Crochet. Laymond Purser, RN, BSN, [place] [phone]

## 2015-08-16 ENCOUNTER — Other Ambulatory Visit: Payer: Self-pay | Admitting: *Deleted

## 2015-08-16 NOTE — Patient Outreach (Signed)
Transition of Care week #2  Patient reporting he has had some GI upset overnight. He states he thinks it is from something "I ate".  Reports several episodes of nausea with vomiting.  Patient denies any chest pain. Reports no changes to medications. He states Tanner Pena is still keeping up with medications and appointments.  RNCM discussed self treatment and when to call MD, patient verbalizes understanding. Plan for face to face visit next week Tanner Pena E. Laymond Purser, RN, BSN, [place] [phone]

## 2015-08-18 ENCOUNTER — Other Ambulatory Visit: Payer: Self-pay | Admitting: Cardiovascular Disease

## 2015-08-18 NOTE — Telephone Encounter (Signed)
REFILL 

## 2015-08-22 ENCOUNTER — Other Ambulatory Visit: Payer: Self-pay | Admitting: *Deleted

## 2015-08-22 MED ORDER — RAMIPRIL 2.5 MG PO CAPS: 2.5000 mg | ORAL_CAPSULE | Freq: Every day | ORAL | Status: DC

## 2015-08-23 ENCOUNTER — Other Ambulatory Visit: Payer: Self-pay | Admitting: *Deleted

## 2015-08-23 NOTE — Patient Outreach (Signed)
[facility]) Care Management   08/23/2015  Tanner Pena 1941/04/09 [id number]  Tanner Pena is an 75 y.o. male  Mr. Tanner Pena has history of GI bleed, NSTEMI, HF, AVR, Diabetes   Subjective:  Patient reports he is doing well. He states he did see Dr. Georgina Peer, and thinks some medications were changed, but not sure, daughter Tanner Pena is keeping medications, she has med bottles, patient has med box.  Patient denies any chest pain. He verbalizes understanding of NTG use.   Patient verbalizes he hopes to get stronger, he had to stop Outpatient PT due to hospitalizations, plans to start exercise instructions given to him by PT.   Objective:   BP 128/60 mmHg  Pulse 68  Resp 20  Wt 212 lb (96.163 kg)  SpO2 97% Review of Systems  HENT: Negative.   Respiratory: Negative.   Cardiovascular: Negative.   Gastrointestinal: Negative.   Genitourinary: Negative.   Musculoskeletal: Negative.   Skin: Negative.   Psychiatric/Behavioral: Negative.     Physical Exam  Constitutional: He is oriented to person, place, and time. He appears well-developed and well-nourished.  Cardiovascular: Normal rate.   GI: Soft.  Musculoskeletal: Normal range of motion.  Neurological: He is alert and oriented to person, place, and time.  Skin: Skin is warm and dry.  Psychiatric: He has a normal mood and affect. His behavior is normal. Judgment and thought content normal.    Current Medications:   Current Outpatient Prescriptions  Medication Sig Dispense Refill  . albuterol (PROVENTIL) 4 MG tablet Take 4 mg by mouth 3 (three) times daily.    [name] albuterol-ipratropium (COMBIVENT) 18-103 MCG/ACT inhaler Inhale 1 puff into the lungs 4 (four) times daily. Coughing/ Shortness of Breath    . allopurinol (ZYLOPRIM) 300 MG tablet Take 300 mg by mouth daily.    . cholecalciferol (VITAMIN D) 1000 UNITS tablet Take 2,000 Units by mouth daily.    . fenofibrate (TRICOR) 145 MG tablet TAKE 1 TABLET BY  MOUTH ONCE DAILY FOR CHOLESTEROL. 30 tablet 3  . ferrous sulfate 325 (65 FE) MG EC tablet Take 1 tablet (325 mg total) by mouth daily with breakfast.    . fish oil-omega-3 fatty acids 1000 MG capsule Take 1 capsule (1 g total) by mouth 2 (two) times daily.    . furosemide (LASIX) 80 MG tablet Take 80 mg by mouth daily.    [name] glimepiride (AMARYL) 1 MG tablet Take 1 mg by mouth 2 (two) times daily.    . isosorbide mononitrate (IMDUR) 60 MG 24 hr tablet Take 1 tablet (60 mg total) by mouth daily. 90 tablet 3  . metoprolol succinate (TOPROL-XL) 50 MG 24 hr tablet Take 50 mg by mouth daily.    [name] NITROSTAT 0.4 MG SL tablet PLACE 1 TAB UNDER TONGUE EVERY 5 MIN IF NEEDED FOR CHEST PAIN. MAY USE 3 TIMES.NO RELIEF CALL 911. 25 tablet 4  . oxyCODONE-acetaminophen (PERCOCET) 10-325 MG per tablet Take 1 tablet by mouth every 4 (four) hours as needed for pain. (Patient taking differently: Take 1 tablet by mouth every 3 (three) hours as needed for pain. ) 30 tablet 0  . pantoprazole (PROTONIX) 40 MG tablet Take 1 tablet (40 mg total) by mouth 2 (two) times daily before a meal. 60 tablet 2  . potassium chloride SA (K-DUR,KLOR-CON) 20 MEQ tablet Take 20 mEq by mouth daily.      . pregabalin (LYRICA) 50 MG capsule Take one capsule by mouth twice daily for  pains (Patient taking differently: Take 50 mg by mouth 3 (three) times daily. ) 60 capsule 5  . ramipril (ALTACE) 2.5 MG capsule Take 1 capsule (2.5 mg total) by mouth daily. 30 capsule 1  . ramipril (ALTACE) 5 MG capsule TAKE 1 CAPSULE BY MOUTH EVERY MORNING. 30 capsule 3  . rosuvastatin (CRESTOR) 20 MG tablet TAKE (1) TABLET BY MOUTH AT BEDTIME FOR CHOLESTEROL. 30 tablet 3  . sitaGLIPtin (JANUVIA) 50 MG tablet Take 50 mg by mouth daily.    . sucralfate (CARAFATE) 1 GM/10ML suspension Take 10 mLs (1 g total) by mouth 4 (four) times daily -  with meals and at bedtime. 420 mL 0  . vitamin C (ASCORBIC ACID) 500 MG tablet Take 500 mg by mouth daily.    [name] warfarin  (COUMADIN) 2.5 MG tablet Take 1 tablet (2.5 mg total) by mouth daily. 30 tablet 1  . clopidogrel (PLAVIX) 75 MG tablet TAKE ONE TABLET BY MOUTH DAILY WITH BREAKFAST. (Patient not taking: Reported on 08/23/2015) 30 tablet 3   No current facility-administered medications for this visit.    Functional Status:   In your present state of health, do you have any difficulty performing the following activities: 08/11/2015 08/07/2015  Hearing? N N  Vision? N N  Difficulty concentrating or making decisions? N N  Walking or climbing stairs? N N  Dressing or bathing? N N  Doing errands, shopping? N N  Preparing Food and eating ? N -  Using the Toilet? N -  In the past six months, have you accidently leaked urine? - -  Do you have problems with loss of bowel control? N -  Managing your Medications? N -  Managing your Finances? N -  Housekeeping or managing your Housekeeping? N -    Fall/Depression Screening:   Fall Risk  08/23/2015 08/11/2015 07/19/2015 06/08/2015  Falls in the past year? No No No No  Risk for fall due to : Impaired balance/gait Impaired balance/gait Impaired balance/gait Impaired balance/gait  Risk for fall due to (comments): - - - currently taking therapy outpatient    PHQ 2/9 Scores 08/11/2015 07/19/2015 06/08/2015  PHQ - 2 Score 0 0 0    Assessment:   Diabetes-reviewed how to use CBG log in [facility] calendar HF-no symptoms this visit but needs to weigh daily  Plan:  [place] calendar and reviewed resources and HF and Diabetes tab educational material. Continue transition of care program calls Visit in February, consider discharge from [place]. Tanner Purser, RN, BSN, [place] [phone]

## 2015-08-24 ENCOUNTER — Telehealth: Payer: Self-pay | Admitting: Cardiovascular Disease

## 2015-08-24 ENCOUNTER — Ambulatory Visit (INDEPENDENT_AMBULATORY_CARE_PROVIDER_SITE_OTHER): Payer: Medicare Other | Admitting: Pharmacist Clinician (PhC)/ Clinical Pharmacy Specialist

## 2015-08-24 ENCOUNTER — Other Ambulatory Visit: Payer: Self-pay | Admitting: Cardiovascular Disease

## 2015-08-24 DIAGNOSIS — Z952 Presence of prosthetic heart valve: Secondary | ICD-10-CM

## 2015-08-24 DIAGNOSIS — Z954 Presence of other heart-valve replacement: Secondary | ICD-10-CM | POA: Diagnosis not present

## 2015-08-24 DIAGNOSIS — Z7901 Long term (current) use of anticoagulants: Secondary | ICD-10-CM

## 2015-08-24 LAB — COAGUCHEK XS/INR WAIVED
INR: 6.4 (ref 0.9–1.1)
Prothrombin Time: 77.3 s

## 2015-08-24 NOTE — Telephone Encounter (Signed)
Tanner Pena( [facility]) is calling with Critical INR

## 2015-08-24 NOTE — Telephone Encounter (Signed)
Tanner Pena confirmed INR 6.4 drawn on patient this morning.  Confirmed home number.  Routed to [place] for dosing recommendation.

## 2015-08-29 ENCOUNTER — Other Ambulatory Visit: Payer: Self-pay | Admitting: *Deleted

## 2015-08-29 NOTE — Patient Outreach (Signed)
Transition of Care week #4 Subjective: Patient doing well He reports he has 2 appointments this week, he has PT/INR and then sees Dr.Balen for diabetes.  Patient recording his blood sugars to take to appointment.  Patient denies any chest pain or any HF symptoms.  Only change to medications was his coumadin, follows up with labs tomorrow. Daughter continues to manage medications for patient.  Assessment: HF Diabetes  Plan: Patient will continue to self monitor HF symptoms Patient will also continue to log CBG's to report to endocrinologist Plan to visit in February, consider discharge from [facility] E. Laymond Purser, RN, BSN, [place] [phone]

## 2015-08-30 ENCOUNTER — Other Ambulatory Visit: Payer: Self-pay | Admitting: Cardiovascular Disease

## 2015-08-30 DIAGNOSIS — Z7901 Long term (current) use of anticoagulants: Secondary | ICD-10-CM | POA: Diagnosis not present

## 2015-08-30 DIAGNOSIS — Z954 Presence of other heart-valve replacement: Secondary | ICD-10-CM | POA: Diagnosis not present

## 2015-08-30 LAB — COAGUCHEK XS/INR WAIVED
INR: 2.7 — AB (ref 0.9–1.1)
PROTHROMBIN TIME: 32.2 s

## 2015-08-30 NOTE — Telephone Encounter (Signed)
Rx request sent to pharmacy.  

## 2015-08-31 DIAGNOSIS — E1165 Type 2 diabetes mellitus with hyperglycemia: Secondary | ICD-10-CM | POA: Diagnosis not present

## 2015-08-31 DIAGNOSIS — I10 Essential (primary) hypertension: Secondary | ICD-10-CM | POA: Diagnosis not present

## 2015-08-31 DIAGNOSIS — E7800 Pure hypercholesterolemia, unspecified: Secondary | ICD-10-CM | POA: Diagnosis not present

## 2015-08-31 DIAGNOSIS — G609 Hereditary and idiopathic neuropathy, unspecified: Secondary | ICD-10-CM | POA: Diagnosis not present

## 2015-09-04 ENCOUNTER — Ambulatory Visit (INDEPENDENT_AMBULATORY_CARE_PROVIDER_SITE_OTHER): Payer: Medicare Other | Admitting: Pharmacist Clinician (PhC)/ Clinical Pharmacy Specialist

## 2015-09-04 DIAGNOSIS — Z7901 Long term (current) use of anticoagulants: Secondary | ICD-10-CM

## 2015-09-04 DIAGNOSIS — Z954 Presence of other heart-valve replacement: Secondary | ICD-10-CM

## 2015-09-04 DIAGNOSIS — Z952 Presence of prosthetic heart valve: Secondary | ICD-10-CM

## 2015-09-08 ENCOUNTER — Other Ambulatory Visit: Payer: Self-pay | Admitting: Cardiovascular Disease

## 2015-09-08 DIAGNOSIS — D649 Anemia, unspecified: Secondary | ICD-10-CM | POA: Diagnosis not present

## 2015-09-08 DIAGNOSIS — Z1389 Encounter for screening for other disorder: Secondary | ICD-10-CM | POA: Diagnosis not present

## 2015-09-08 DIAGNOSIS — Z954 Presence of other heart-valve replacement: Secondary | ICD-10-CM | POA: Diagnosis not present

## 2015-09-08 DIAGNOSIS — G894 Chronic pain syndrome: Secondary | ICD-10-CM | POA: Diagnosis not present

## 2015-09-08 DIAGNOSIS — J449 Chronic obstructive pulmonary disease, unspecified: Secondary | ICD-10-CM | POA: Diagnosis not present

## 2015-09-08 DIAGNOSIS — R631 Polydipsia: Secondary | ICD-10-CM | POA: Diagnosis not present

## 2015-09-08 DIAGNOSIS — Z7901 Long term (current) use of anticoagulants: Secondary | ICD-10-CM | POA: Diagnosis not present

## 2015-09-08 DIAGNOSIS — Z79899 Other long term (current) drug therapy: Secondary | ICD-10-CM | POA: Diagnosis not present

## 2015-09-08 DIAGNOSIS — B351 Tinea unguium: Secondary | ICD-10-CM | POA: Diagnosis not present

## 2015-09-08 LAB — COAGUCHEK XS/INR WAIVED
INR: 4.6 — AB (ref 0.9–1.1)
PROTHROMBIN TIME: 55.7 s

## 2015-09-11 ENCOUNTER — Ambulatory Visit (INDEPENDENT_AMBULATORY_CARE_PROVIDER_SITE_OTHER): Payer: Medicare Other | Admitting: Pharmacist Clinician (PhC)/ Clinical Pharmacy Specialist

## 2015-09-11 ENCOUNTER — Encounter: Payer: Self-pay | Admitting: Internal Medicine

## 2015-09-11 DIAGNOSIS — Z954 Presence of other heart-valve replacement: Secondary | ICD-10-CM

## 2015-09-11 DIAGNOSIS — Z7901 Long term (current) use of anticoagulants: Secondary | ICD-10-CM

## 2015-09-11 DIAGNOSIS — Z952 Presence of prosthetic heart valve: Secondary | ICD-10-CM

## 2015-09-27 ENCOUNTER — Other Ambulatory Visit: Payer: Self-pay | Admitting: Cardiovascular Disease

## 2015-09-27 DIAGNOSIS — D649 Anemia, unspecified: Secondary | ICD-10-CM | POA: Diagnosis not present

## 2015-09-27 DIAGNOSIS — Z7901 Long term (current) use of anticoagulants: Secondary | ICD-10-CM | POA: Diagnosis not present

## 2015-09-27 DIAGNOSIS — Z954 Presence of other heart-valve replacement: Secondary | ICD-10-CM | POA: Diagnosis not present

## 2015-09-27 LAB — PROTIME-INR: INR: 3.7 — AB (ref <=1.1)

## 2015-09-27 LAB — COAGUCHEK XS/INR WAIVED
INR: 3.7 — ABNORMAL HIGH (ref 0.9–1.1)
PROTHROMBIN TIME: 43.8 s

## 2015-09-29 ENCOUNTER — Ambulatory Visit (INDEPENDENT_AMBULATORY_CARE_PROVIDER_SITE_OTHER): Payer: Medicare Other | Admitting: Pharmacist Clinician (PhC)/ Clinical Pharmacy Specialist

## 2015-09-29 DIAGNOSIS — Z7901 Long term (current) use of anticoagulants: Secondary | ICD-10-CM

## 2015-09-29 DIAGNOSIS — Z954 Presence of other heart-valve replacement: Secondary | ICD-10-CM

## 2015-09-29 DIAGNOSIS — Z952 Presence of prosthetic heart valve: Secondary | ICD-10-CM

## 2015-09-30 ENCOUNTER — Inpatient Hospital Stay (HOSPITAL_COMMUNITY)
Admission: EM | Admit: 2015-09-30 | Discharge: 2015-10-14 | DRG: 378 | Disposition: A | Discharge status: Home / Self Care | Payer: Medicare Other | Attending: Internal Medicine | Admitting: Internal Medicine

## 2015-09-30 ENCOUNTER — Encounter (HOSPITAL_COMMUNITY): Payer: Self-pay

## 2015-09-30 DIAGNOSIS — Q2733 Arteriovenous malformation of digestive system vessel: Secondary | ICD-10-CM

## 2015-09-30 DIAGNOSIS — K219 Gastro-esophageal reflux disease without esophagitis: Secondary | ICD-10-CM | POA: Diagnosis present

## 2015-09-30 DIAGNOSIS — K254 Chronic or unspecified gastric ulcer with hemorrhage: Principal | ICD-10-CM | POA: Diagnosis present

## 2015-09-30 DIAGNOSIS — M109 Gout, unspecified: Secondary | ICD-10-CM | POA: Diagnosis present

## 2015-09-30 DIAGNOSIS — N179 Acute kidney failure, unspecified: Secondary | ICD-10-CM | POA: Diagnosis not present

## 2015-09-30 DIAGNOSIS — K259 Gastric ulcer, unspecified as acute or chronic, without hemorrhage or perforation: Secondary | ICD-10-CM | POA: Diagnosis not present

## 2015-09-30 DIAGNOSIS — I2510 Atherosclerotic heart disease of native coronary artery without angina pectoris: Secondary | ICD-10-CM | POA: Diagnosis not present

## 2015-09-30 DIAGNOSIS — Z954 Presence of other heart-valve replacement: Secondary | ICD-10-CM | POA: Diagnosis not present

## 2015-09-30 DIAGNOSIS — I255 Ischemic cardiomyopathy: Secondary | ICD-10-CM | POA: Diagnosis present

## 2015-09-30 DIAGNOSIS — J449 Chronic obstructive pulmonary disease, unspecified: Secondary | ICD-10-CM | POA: Diagnosis present

## 2015-09-30 DIAGNOSIS — D62 Acute posthemorrhagic anemia: Secondary | ICD-10-CM | POA: Diagnosis not present

## 2015-09-30 DIAGNOSIS — K257 Chronic gastric ulcer without hemorrhage or perforation: Secondary | ICD-10-CM | POA: Diagnosis present

## 2015-09-30 DIAGNOSIS — Z952 Presence of prosthetic heart valve: Secondary | ICD-10-CM | POA: Diagnosis not present

## 2015-09-30 DIAGNOSIS — R791 Abnormal coagulation profile: Secondary | ICD-10-CM | POA: Diagnosis present

## 2015-09-30 DIAGNOSIS — Z951 Presence of aortocoronary bypass graft: Secondary | ICD-10-CM | POA: Diagnosis not present

## 2015-09-30 DIAGNOSIS — K5521 Angiodysplasia of colon with hemorrhage: Secondary | ICD-10-CM | POA: Diagnosis present

## 2015-09-30 DIAGNOSIS — G894 Chronic pain syndrome: Secondary | ICD-10-CM | POA: Diagnosis not present

## 2015-09-30 DIAGNOSIS — G4733 Obstructive sleep apnea (adult) (pediatric): Secondary | ICD-10-CM | POA: Diagnosis not present

## 2015-09-30 DIAGNOSIS — Z955 Presence of coronary angioplasty implant and graft: Secondary | ICD-10-CM | POA: Diagnosis not present

## 2015-09-30 DIAGNOSIS — K253 Acute gastric ulcer without hemorrhage or perforation: Secondary | ICD-10-CM | POA: Diagnosis not present

## 2015-09-30 DIAGNOSIS — I5022 Chronic systolic (congestive) heart failure: Secondary | ICD-10-CM | POA: Diagnosis not present

## 2015-09-30 DIAGNOSIS — Z89512 Acquired absence of left leg below knee: Secondary | ICD-10-CM

## 2015-09-30 DIAGNOSIS — Z7901 Long term (current) use of anticoagulants: Secondary | ICD-10-CM | POA: Diagnosis not present

## 2015-09-30 DIAGNOSIS — E7800 Pure hypercholesterolemia, unspecified: Secondary | ICD-10-CM | POA: Diagnosis not present

## 2015-09-30 DIAGNOSIS — D649 Anemia, unspecified: Secondary | ICD-10-CM | POA: Diagnosis not present

## 2015-09-30 DIAGNOSIS — Z79899 Other long term (current) drug therapy: Secondary | ICD-10-CM | POA: Diagnosis not present

## 2015-09-30 DIAGNOSIS — R42 Dizziness and giddiness: Secondary | ICD-10-CM | POA: Diagnosis not present

## 2015-09-30 DIAGNOSIS — Z7984 Long term (current) use of oral hypoglycemic drugs: Secondary | ICD-10-CM

## 2015-09-30 DIAGNOSIS — R7309 Other abnormal glucose: Secondary | ICD-10-CM | POA: Diagnosis not present

## 2015-09-30 DIAGNOSIS — E1151 Type 2 diabetes mellitus with diabetic peripheral angiopathy without gangrene: Secondary | ICD-10-CM | POA: Diagnosis present

## 2015-09-30 DIAGNOSIS — K31819 Angiodysplasia of stomach and duodenum without bleeding: Secondary | ICD-10-CM | POA: Diagnosis not present

## 2015-09-30 DIAGNOSIS — G546 Phantom limb syndrome with pain: Secondary | ICD-10-CM | POA: Diagnosis not present

## 2015-09-30 DIAGNOSIS — I252 Old myocardial infarction: Secondary | ICD-10-CM | POA: Diagnosis not present

## 2015-09-30 DIAGNOSIS — I6529 Occlusion and stenosis of unspecified carotid artery: Secondary | ICD-10-CM | POA: Diagnosis not present

## 2015-09-30 DIAGNOSIS — K921 Melena: Secondary | ICD-10-CM | POA: Diagnosis not present

## 2015-09-30 DIAGNOSIS — K922 Gastrointestinal hemorrhage, unspecified: Secondary | ICD-10-CM | POA: Diagnosis not present

## 2015-09-30 DIAGNOSIS — R531 Weakness: Secondary | ICD-10-CM | POA: Diagnosis not present

## 2015-09-30 DIAGNOSIS — I447 Left bundle-branch block, unspecified: Secondary | ICD-10-CM | POA: Diagnosis not present

## 2015-09-30 DIAGNOSIS — I493 Ventricular premature depolarization: Secondary | ICD-10-CM | POA: Diagnosis present

## 2015-09-30 DIAGNOSIS — E119 Type 2 diabetes mellitus without complications: Secondary | ICD-10-CM | POA: Diagnosis present

## 2015-09-30 DIAGNOSIS — Q2730 Arteriovenous malformation, site unspecified: Secondary | ICD-10-CM | POA: Diagnosis present

## 2015-09-30 DIAGNOSIS — R739 Hyperglycemia, unspecified: Secondary | ICD-10-CM | POA: Diagnosis not present

## 2015-09-30 DIAGNOSIS — R008 Other abnormalities of heart beat: Secondary | ICD-10-CM | POA: Diagnosis not present

## 2015-09-30 DIAGNOSIS — E785 Hyperlipidemia, unspecified: Secondary | ICD-10-CM | POA: Diagnosis present

## 2015-09-30 LAB — COMPREHENSIVE METABOLIC PANEL
ALBUMIN: 2.9 g/dL — AB (ref 3.5–5.0)
ALT: 12 U/L — ABNORMAL LOW (ref 17–63)
ANION GAP: 8 (ref 5–15)
AST: 17 U/L (ref 15–41)
Alkaline Phosphatase: 22 U/L — ABNORMAL LOW (ref 38–126)
BUN: 44 mg/dL — AB (ref 6–20)
CHLORIDE: 109 mmol/L (ref 101–111)
CO2: 24 mmol/L (ref 22–32)
Calcium: 8.8 mg/dL — ABNORMAL LOW (ref 8.9–10.3)
Creatinine, Ser: 1.9 mg/dL — ABNORMAL HIGH (ref 0.61–1.24)
GFR calc Af Amer: 38 mL/min — ABNORMAL LOW (ref >60)
GFR calc non Af Amer: 33 mL/min — ABNORMAL LOW (ref >60)
GLUCOSE: 338 mg/dL — AB (ref 65–99)
POTASSIUM: 4.7 mmol/L (ref 3.5–5.1)
SODIUM: 141 mmol/L (ref 135–145)
Total Bilirubin: 0.5 mg/dL (ref 0.3–1.2)
Total Protein: 5.3 g/dL — ABNORMAL LOW (ref 6.5–8.1)

## 2015-09-30 LAB — MRSA PCR SCREENING: MRSA BY PCR: NEGATIVE

## 2015-09-30 LAB — CBC WITH DIFFERENTIAL/PLATELET
BASOS ABS: 0.1 10*3/uL (ref 0.0–0.1)
BASOS PCT: 1 %
EOS ABS: 0.2 10*3/uL (ref 0.0–0.7)
Eosinophils Relative: 1 %
HEMATOCRIT: 21.4 % — AB (ref 39.0–52.0)
Hemoglobin: 6.3 g/dL — CL (ref 13.0–17.0)
Lymphocytes Relative: 11 %
Lymphs Abs: 1.6 10*3/uL (ref 0.7–4.0)
MCH: 23.8 pg — ABNORMAL LOW (ref 26.0–34.0)
MCHC: 29.4 g/dL — ABNORMAL LOW (ref 30.0–36.0)
MCV: 80.8 fL (ref 78.0–100.0)
MONO ABS: 0.8 10*3/uL (ref 0.1–1.0)
Monocytes Relative: 6 %
NEUTROS ABS: 11.5 10*3/uL — AB (ref 1.7–7.7)
Neutrophils Relative %: 81 %
PLATELETS: 288 10*3/uL (ref 150–400)
RBC: 2.65 MIL/uL — ABNORMAL LOW (ref 4.22–5.81)
RDW: 18.4 % — AB (ref 11.5–15.5)
WBC: 14.2 10*3/uL — ABNORMAL HIGH (ref 4.0–10.5)

## 2015-09-30 LAB — CBC
HEMATOCRIT: 24.3 % — AB (ref 39.0–52.0)
HEMOGLOBIN: 7.5 g/dL — AB (ref 13.0–17.0)
MCH: 24.6 pg — AB (ref 26.0–34.0)
MCHC: 30.9 g/dL (ref 30.0–36.0)
MCV: 79.7 fL (ref 78.0–100.0)
Platelets: 227 10*3/uL (ref 150–400)
RBC: 3.05 MIL/uL — AB (ref 4.22–5.81)
RDW: 17.2 % — ABNORMAL HIGH (ref 11.5–15.5)
WBC: 12.9 10*3/uL — ABNORMAL HIGH (ref 4.0–10.5)

## 2015-09-30 LAB — CBG MONITORING, ED: GLUCOSE-CAPILLARY: 274 mg/dL — AB (ref 65–99)

## 2015-09-30 LAB — POC OCCULT BLOOD, ED: Fecal Occult Bld: POSITIVE — AB

## 2015-09-30 LAB — GLUCOSE, CAPILLARY
GLUCOSE-CAPILLARY: 281 mg/dL — AB (ref 65–99)
Glucose-Capillary: 221 mg/dL — ABNORMAL HIGH (ref 65–99)

## 2015-09-30 LAB — PREPARE RBC (CROSSMATCH)

## 2015-09-30 LAB — PROTIME-INR
INR: 4.39 — ABNORMAL HIGH (ref 0.00–1.49)
Prothrombin Time: 40.7 seconds — ABNORMAL HIGH (ref 11.6–15.2)

## 2015-09-30 MED ORDER — PANTOPRAZOLE SODIUM 40 MG IV SOLR
INTRAVENOUS | Status: AC
  Filled 2015-09-30: qty 40

## 2015-09-30 MED ORDER — PANTOPRAZOLE SODIUM 40 MG IV SOLR: 40.0000 mg | Freq: Two times a day (BID) | INTRAVENOUS | Status: DC

## 2015-09-30 MED ORDER — OXYCODONE-ACETAMINOPHEN 5-325 MG PO TABS
2.0000 | ORAL_TABLET | Freq: Once | ORAL | Status: AC
  Administered 2015-10-01: 2 via ORAL
  Filled 2015-09-30: qty 2

## 2015-09-30 MED ORDER — ONDANSETRON HCL 4 MG/2ML IJ SOLN
INTRAMUSCULAR | Status: AC
Start: 2015-09-30 — End: 2015-09-30
  Filled 2015-09-30: qty 2

## 2015-09-30 MED ORDER — ONDANSETRON HCL 4 MG/2ML IJ SOLN
4.0000 mg | Freq: Four times a day (QID) | INTRAMUSCULAR | Status: DC | PRN
  Administered 2015-10-07: 4 mg via INTRAVENOUS
  Filled 2015-09-30: qty 2

## 2015-09-30 MED ORDER — ACETAMINOPHEN 650 MG RE SUPP: 650.0000 mg | Freq: Four times a day (QID) | RECTAL | Status: DC | PRN

## 2015-09-30 MED ORDER — SODIUM CHLORIDE 0.9 % IV SOLN
Freq: Once | INTRAVENOUS | Status: AC
  Administered 2015-09-30: 1000 mL via INTRAVENOUS

## 2015-09-30 MED ORDER — INSULIN ASPART 100 UNIT/ML ~~LOC~~ SOLN
3.0000 [IU] | Freq: Three times a day (TID) | SUBCUTANEOUS | Status: AC
  Administered 2015-10-02 – 2015-10-06 (×11): 3 [IU] via SUBCUTANEOUS

## 2015-09-30 MED ORDER — ONDANSETRON HCL 4 MG PO TABS
4.0000 mg | ORAL_TABLET | Freq: Four times a day (QID) | ORAL | Status: DC | PRN
Start: 2015-09-30 — End: 2015-10-14
  Administered 2015-10-06: 4 mg via ORAL
  Filled 2015-09-30: qty 1

## 2015-09-30 MED ORDER — SUCRALFATE 1 GM/10ML PO SUSP
1.0000 g | Freq: Three times a day (TID) | ORAL | Status: DC
  Administered 2015-09-30 – 2015-10-14 (×53): 1 g via ORAL
  Filled 2015-09-30 (×52): qty 10

## 2015-09-30 MED ORDER — IPRATROPIUM-ALBUTEROL 0.5-2.5 (3) MG/3ML IN SOLN: 3.0000 mL | RESPIRATORY_TRACT | Status: DC | PRN

## 2015-09-30 MED ORDER — RAMIPRIL 2.5 MG PO CAPS
2.5000 mg | ORAL_CAPSULE | Freq: Every day | ORAL | Status: DC
  Administered 2015-09-30 – 2015-10-02 (×3): 2.5 mg via ORAL
  Filled 2015-09-30 (×4): qty 1

## 2015-09-30 MED ORDER — SODIUM CHLORIDE 0.9 % IV BOLUS (SEPSIS)
250.0000 mL | Freq: Once | INTRAVENOUS | Status: AC
  Administered 2015-09-30: 250 mL via INTRAVENOUS

## 2015-09-30 MED ORDER — ROSUVASTATIN CALCIUM 40 MG PO TABS
20.0000 mg | ORAL_TABLET | Freq: Every day | ORAL | Status: DC
  Administered 2015-09-30 – 2015-10-13 (×14): 20 mg via ORAL
  Filled 2015-09-30 (×4): qty 2
  Filled 2015-09-30 (×2): qty 1
  Filled 2015-09-30: qty 2
  Filled 2015-09-30 (×3): qty 1
  Filled 2015-09-30: qty 2
  Filled 2015-09-30: qty 1
  Filled 2015-09-30 (×2): qty 2
  Filled 2015-09-30 (×2): qty 1

## 2015-09-30 MED ORDER — SODIUM CHLORIDE 0.9 % IV SOLN
INTRAVENOUS | Status: DC
  Administered 2015-09-30 – 2015-10-01 (×2): via INTRAVENOUS

## 2015-09-30 MED ORDER — ISOSORBIDE MONONITRATE ER 60 MG PO TB24
60.0000 mg | ORAL_TABLET | Freq: Every day | ORAL | Status: DC
  Administered 2015-09-30 – 2015-10-14 (×15): 60 mg via ORAL
  Filled 2015-09-30 (×2): qty 1
  Filled 2015-09-30 (×2): qty 2
  Filled 2015-09-30 (×4): qty 1
  Filled 2015-09-30 (×2): qty 2
  Filled 2015-09-30: qty 1
  Filled 2015-09-30: qty 2
  Filled 2015-09-30: qty 1
  Filled 2015-09-30 (×2): qty 2

## 2015-09-30 MED ORDER — INSULIN ASPART 100 UNIT/ML ~~LOC~~ SOLN
0.0000 [IU] | Freq: Every day | SUBCUTANEOUS | Status: DC
  Administered 2015-09-30: 2 [IU] via SUBCUTANEOUS
  Administered 2015-10-03: 0 [IU] via SUBCUTANEOUS
  Administered 2015-10-04 – 2015-10-07 (×2): 2 [IU] via SUBCUTANEOUS

## 2015-09-30 MED ORDER — SODIUM CHLORIDE 0.9 % IV SOLN
8.0000 mg/h | INTRAVENOUS | Status: DC
  Administered 2015-09-30: 8 mg/h via INTRAVENOUS
  Filled 2015-09-30 (×6): qty 80

## 2015-09-30 MED ORDER — SODIUM CHLORIDE 0.9 % IV SOLN: 10.0000 mL/h | Freq: Once | INTRAVENOUS | Status: DC

## 2015-09-30 MED ORDER — OMEGA-3-ACID ETHYL ESTERS 1 G PO CAPS
1.0000 g | ORAL_CAPSULE | Freq: Two times a day (BID) | ORAL | Status: DC
  Administered 2015-09-30 – 2015-10-14 (×28): 1 g via ORAL
  Filled 2015-09-30 (×28): qty 1

## 2015-09-30 MED ORDER — SODIUM CHLORIDE 0.9 % IV SOLN: Freq: Once | INTRAVENOUS | Status: DC

## 2015-09-30 MED ORDER — IPRATROPIUM-ALBUTEROL 0.5-2.5 (3) MG/3ML IN SOLN
3.0000 mL | Freq: Four times a day (QID) | RESPIRATORY_TRACT | Status: DC
  Administered 2015-09-30 (×2): 3 mL via RESPIRATORY_TRACT
  Filled 2015-09-30: qty 3
  Filled 2015-09-30: qty 39

## 2015-09-30 MED ORDER — ACETAMINOPHEN 325 MG PO TABS: 650.0000 mg | ORAL_TABLET | Freq: Four times a day (QID) | ORAL | Status: DC | PRN

## 2015-09-30 MED ORDER — SODIUM CHLORIDE 0.9 % IV SOLN
80.0000 mg | Freq: Once | INTRAVENOUS | Status: AC
  Administered 2015-09-30: 80 mg via INTRAVENOUS
  Filled 2015-09-30: qty 80

## 2015-09-30 MED ORDER — INSULIN ASPART 100 UNIT/ML ~~LOC~~ SOLN
0.0000 [IU] | Freq: Three times a day (TID) | SUBCUTANEOUS | Status: DC
  Administered 2015-09-30: 100 [IU] via SUBCUTANEOUS
  Administered 2015-10-01 (×2): 2 [IU] via SUBCUTANEOUS
  Administered 2015-10-01: 3 [IU] via SUBCUTANEOUS
  Administered 2015-10-02: 2 [IU] via SUBCUTANEOUS
  Administered 2015-10-02: 3 [IU] via SUBCUTANEOUS
  Administered 2015-10-02 – 2015-10-03 (×2): 2 [IU] via SUBCUTANEOUS
  Administered 2015-10-03: 1 [IU] via SUBCUTANEOUS
  Administered 2015-10-03 – 2015-10-05 (×6): 2 [IU] via SUBCUTANEOUS
  Administered 2015-10-06: 1 [IU] via SUBCUTANEOUS
  Administered 2015-10-06 – 2015-10-07 (×4): 2 [IU] via SUBCUTANEOUS
  Administered 2015-10-07 – 2015-10-08 (×3): 1 [IU] via SUBCUTANEOUS
  Administered 2015-10-08 – 2015-10-09 (×2): 2 [IU] via SUBCUTANEOUS
  Administered 2015-10-09 – 2015-10-10 (×3): 1 [IU] via SUBCUTANEOUS
  Administered 2015-10-10 – 2015-10-11 (×4): 2 [IU] via SUBCUTANEOUS
  Administered 2015-10-12: 1 [IU] via SUBCUTANEOUS
  Administered 2015-10-12: 2 [IU] via SUBCUTANEOUS
  Administered 2015-10-13: 1 [IU] via SUBCUTANEOUS
  Administered 2015-10-13: 2 [IU] via SUBCUTANEOUS
  Administered 2015-10-13 – 2015-10-14 (×2): 1 [IU] via SUBCUTANEOUS

## 2015-09-30 MED ORDER — PREGABALIN 25 MG PO CAPS
50.0000 mg | ORAL_CAPSULE | Freq: Three times a day (TID) | ORAL | Status: DC
  Administered 2015-09-30 – 2015-10-14 (×41): 50 mg via ORAL
  Filled 2015-09-30 (×41): qty 2

## 2015-09-30 MED ORDER — SENNOSIDES-DOCUSATE SODIUM 8.6-50 MG PO TABS: 1.0000 | ORAL_TABLET | Freq: Every evening | ORAL | Status: DC | PRN

## 2015-09-30 MED ORDER — PANTOPRAZOLE SODIUM 40 MG IV SOLR
40.0000 mg | Freq: Two times a day (BID) | INTRAVENOUS | Status: DC
  Administered 2015-09-30 – 2015-10-03 (×6): 40 mg via INTRAVENOUS
  Filled 2015-09-30 (×6): qty 40

## 2015-09-30 MED ORDER — METOPROLOL SUCCINATE ER 25 MG PO TB24
25.0000 mg | ORAL_TABLET | Freq: Every day | ORAL | Status: DC
  Administered 2015-09-30 – 2015-10-13 (×13): 25 mg via ORAL
  Filled 2015-09-30 (×18): qty 1

## 2015-09-30 MED ORDER — ONDANSETRON HCL 4 MG/2ML IJ SOLN
4.0000 mg | Freq: Once | INTRAMUSCULAR | Status: AC
  Administered 2015-09-30: 4 mg via INTRAVENOUS

## 2015-09-30 NOTE — ED Notes (Signed)
CRITICAL VALUE ALERT  Critical value received: Hgb 6.3  Date of notification:  09/29/2014 Time of notification:  0732  Critical value read back:yes  Nurse who received alert:  Nolon Stalls, RN  MD notified (1st page):  Dr. Jeneen Rinks  Time of first page:  [phone]  MD notified (2nd page):  Time of second page:  Responding MD:  Dr. Jeneen Rinks  Time MD responded:  [phone]

## 2015-09-30 NOTE — ED Notes (Signed)
No rate change previously due to order to infuse over 3 hours.

## 2015-09-30 NOTE — ED Notes (Signed)
Pt placed on O2 due to hgb of 6.3, althought pt is 100% on RA. NAD. Pt currently on BSC.

## 2015-09-30 NOTE — ED Notes (Signed)
Pt had moderate amount of liquid stool, dark in color.

## 2015-09-30 NOTE — H&P (Signed)
[facility]          History and Physical    PCP:   [name], MD   EDP: Tanna Furry, M.D.  Chief Complaint:  Melena  HPI: Patient is a 75 year old man well-known to me from multiple prior admissions for melena and non-ST elevated MI. This is complicated by an aortic mechanical valve that requires anticoagulation with Coumadin. 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 He states that today he noticed dark blood in his stools which prompted his immediate return to the emergency department for evaluation. He also notes that over the past 2-3 days he has become more short of breath with exertion as well as having some minimal chest pain. Upon arrival to the emergency department he is found to have a hemoglobin of 6.3. After discussion with GI physician, we have been made aware of the fact that the blood bank does not have much blood in stock and also with the lack of cardiology coverage over the weekend it has been determined that it is safer for the patient to be transferred to [facility] for further evaluation and management.  Allergies:  No Known Allergies    Past Medical History  Diagnosis Date  . Type 2 diabetes mellitus ([place]) 2007  . Essential hypertension   . Gout   . Hypercholesteremia   . Peripheral vascular disease ([place])     Lower extremity PCI/stenting  . COPD (chronic obstructive pulmonary disease) ([place])   . S/P aortic valve replacement 1990    a. St. Jude  . Chronic back pain   . Dysphagia   . Neuromuscular disorder ([place])   . Peripheral neuropathy ([place])   . Critical lower limb ischemia   . CAD (coronary artery disease)     a. 05/13/14 [name] s/p overlapping DESx2 to SVG to RCA. b.  s/p CABG '90 with redo '94 & stent to RCA SVG in 2005  . Chronic toe ulcer ([place])     a. Left foot  . Chronic systolic heart failure ([place])   . History of kidney stones   . GERD  (gastroesophageal reflux disease)   . Arthritis   . Sleep apnea   . Myocardial infarction ([place])   . GI bleeding 05/31/2015    Source not identified.  . Acute blood loss anemia 02/2015 & 05/2015    Hemoglobin 5.5 on 05/31/15; status post transfusion.    Past Surgical History  Procedure Laterality Date  . Aortic valve replacement  1990    St. Jude  . Rotator cuff repair      right  . Cataract extraction      bilateral  . Coronary stent placement  2005    RCA vein graft A 3.0x13.0 TAXUS stent was then placed int he vessel a Viva 3.0x4.0 (perfusion balloon was made ready it was placed through the entire lenght of the stent  . Peripheral vascular procedures lower extremities      Right external iliac  artery PTA and stenting as well as bilateral SFA intervention remotely. Repeat procedures in 2011 bilaterally  . Coronary artery bypass graft  1994    6 vessels  . [name]  06/13/2011    Procedure: Venia Minks DILATION;  Surgeon: [name], MD;  Location: [facility];  Service: Endoscopy;  Laterality: N/A;  Dilated to 56.   [name]  Angioplasty illiac artery    . Back surgery  [id number]    2  . Eye surgery    . Amputation Left 07/13/2014    Procedure: Transmetatarsal Amputation;  Surgeon: [name], MD;  Location: [place];  Service: Orthopedics;  Laterality: Left;  . Lower extremity angiogram N/A 02/15/2013    Procedure: LOWER EXTREMITY ANGIOGRAM;  Surgeon: [name], MD;  Location: [facility] CATH LAB;  Service: Cardiovascular;  Laterality: N/A;  . Left heart catheterization with coronary angiogram N/A 05/11/2014    Procedure: LEFT HEART CATHETERIZATION WITH CORONARY ANGIOGRAM;  Surgeon: [name], MD;  Location: [facility] CATH LAB;  Service: Cardiovascular;  Laterality: N/A;  . Percutaneous coronary stent intervention (pci-s) N/A 05/13/2014    Procedure: PERCUTANEOUS CORONARY STENT INTERVENTION (PCI-S);  Surgeon: [name], MD;  Location: [facility] CATH LAB;  Service:  Cardiovascular;  Laterality: N/A;  . Lower extremity angiogram N/A 06/06/2014    Procedure: LOWER EXTREMITY ANGIOGRAM;  Surgeon: [name], MD;  Location: [facility] CATH LAB;  Service: Cardiovascular;  Laterality: N/A;  . Amputation Left 08/20/2014    Procedure: Revision Transmetatarsal Amputation versus Below Knee Amputation;  Surgeon: [name], MD;  Location: [place];  Service: Orthopedics;  Laterality: Left;  . Stump revision Left 09/23/2014    Procedure: Revision Left Below Knee Amputation;  Surgeon: [name], MD;  Location: [place];  Service: Orthopedics;  Laterality: Left;  . Stump revision Left 10/13/2014    Procedure: REVISION LEFT BELOW KNEE AMPUTATION STUMP;  Surgeon: [name], MD;  Location: [facility];  Service: Orthopedics;  Laterality: Left;  . Esophagogastroduodenoscopy N/A 02/09/2015    DR. Schooler: Normal EGD  . Esophagogastroduodenoscopy  05/2011    Dr. [name]: s/p esophageal dilation, antral erosions/nodularity with benign biopsies  . Colonoscopy  05/2011    Dr. [name]: benign rectal polyp, left sided tics, ascending colonic ulcers (path c/w ischemia)  . Colonoscopy with propofol N/A 06/01/2015    RMR: normal appearing rectal mucosa. Scattered left-sided diverticula. the remainder of the colonic mucosa appeared normal. the distal 5 cm of terminal ileal mucosa also appeared normal. retroflexion was performed.   Freda Munro capsule study N/A 06/08/2015    Procedure: GIVENS CAPSULE STUDY;  Surgeon: [name], MD;  [facility]: [facility];  Service: Endoscopy;  Laterality: N/A;  0700   . Esophagogastroduodenoscopy (egd) with propofol N/A 06/20/2015    Procedure: ESOPHAGOGASTRODUODENOSCOPY (EGD) WITH PROPOFOL;  Surgeon: Danie Binder, MD;  Location: [facility];  Service: Endoscopy;  Laterality: N/A;  . Biopsy N/A 06/20/2015    Procedure: BIOPSY;  Surgeon: Danie Binder, MD;  Location: [facility];  Service: Endoscopy;  Laterality: N/A;    Prior to Admission medications     Medication Sig Start Date End Date Taking? Authorizing Provider  albuterol (PROVENTIL) 4 MG tablet Take 4 mg by mouth 3 (three) times daily. 01/20/15  Yes Historical Provider, MD  albuterol-ipratropium (COMBIVENT) 18-103 MCG/ACT inhaler Inhale 1 puff into the lungs 4 (four) times daily. Coughing/ Shortness of Breath   Yes Historical Provider, MD  allopurinol (ZYLOPRIM) 300 MG tablet Take 300 mg by mouth daily.   Yes Historical Provider, MD  CARAFATE 1 GM/10ML suspension TAKE 10 ML BY MOUTH FOUR TIMES DAILY WITH MEALS AND AT BEDTIME. 08/30/15  Yes Troy Sine, MD  cholecalciferol (VITAMIN D) 1000 UNITS tablet Take 2,000 Units by mouth daily.   Yes Historical Provider, MD  fenofibrate (TRICOR) 145 MG tablet TAKE 1  TABLET BY MOUTH ONCE DAILY FOR CHOLESTEROL. 08/18/15  Yes [name], MD  fish oil-omega-3 fatty acids 1000 MG capsule Take 1 capsule (1 g total) by mouth 2 (two) times daily. 01/22/13  Yes Kristin L Alvstad, RPH-CPP  furosemide (LASIX) 80 MG tablet Take 80 mg by mouth daily. 07/21/15  Yes Historical Provider, MD  glimepiride (AMARYL) 1 MG tablet Take 1 mg by mouth 2 (two) times daily.   Yes Historical Provider, MD  isosorbide mononitrate (IMDUR) 60 MG 24 hr tablet Take 1 tablet (60 mg total) by mouth daily. 07/27/15  Yes Troy Sine, MD  metoprolol succinate (TOPROL-XL) 25 MG 24 hr tablet Take 25 mg by mouth daily.   Yes Historical Provider, MD  NITROSTAT 0.4 MG SL tablet PLACE 1 TAB UNDER TONGUE EVERY 5 MIN IF NEEDED FOR CHEST PAIN. MAY USE 3 TIMES.NO RELIEF CALL 911. 07/20/15  Yes Eileen Stanford, PA-C  oxyCODONE-acetaminophen (PERCOCET) 10-325 MG per tablet Take 1 tablet by mouth every 4 (four) hours as needed for pain. Patient taking differently: Take 1 tablet by mouth every 3 (three) hours as needed for pain.  09/23/14  Yes [name], MD  pantoprazole (PROTONIX) 40 MG tablet Take 1 tablet (40 mg total) by mouth 2 (two) times daily before a meal. 06/23/15  Yes Estela Leonie Green, MD  potassium chloride SA (K-DUR,KLOR-CON) 20 MEQ tablet Take 20 mEq by mouth daily.     Yes Historical Provider, MD  pregabalin (LYRICA) 50 MG capsule Take one capsule by mouth twice daily for pains Patient taking differently: Take 50 mg by mouth 3 (three) times daily.  08/23/14  Yes Mahima Pandey, MD  ramipril (ALTACE) 2.5 MG capsule Take 1 capsule (2.5 mg total) by mouth daily. Patient taking differently: Take 2.5 mg by mouth at bedtime.  08/22/15  Yes [name], MD  ramipril (ALTACE) 5 MG capsule TAKE 1 CAPSULE BY MOUTH EVERY MORNING. 08/18/15  Yes [name], MD  rosuvastatin (CRESTOR) 20 MG tablet TAKE (1) TABLET BY MOUTH AT BEDTIME FOR CHOLESTEROL. 08/18/15  Yes [name], MD  sitaGLIPtin (JANUVIA) 50 MG tablet Take 50 mg by mouth daily.   Yes Historical Provider, MD  vitamin C (ASCORBIC ACID) 500 MG tablet Take 500 mg by mouth daily.   Yes Historical Provider, MD  warfarin (COUMADIN) 5 MG tablet Take 2.5-5 mg by mouth See admin instructions. Takes 5 mg on Thursday and Sunday, and then 2.5 mg all other days.   Yes Historical Provider, MD  clopidogrel (PLAVIX) 75 MG tablet TAKE ONE TABLET BY MOUTH DAILY WITH BREAKFAST. Patient not taking: Reported on 08/23/2015 08/18/15   [name], MD  ferrous sulfate 325 (65 FE) MG EC tablet Take 1 tablet (325 mg total) by mouth daily with breakfast. Patient not taking: Reported on 09/30/2015 06/02/15   Rexene Alberts, MD  warfarin (COUMADIN) 2.5 MG tablet Take 1 tablet (2.5 mg total) by mouth daily. Patient not taking: Reported on 09/30/2015 08/10/15   Orvan Falconer, MD    Social History:  reports that he quit smoking about 16 months ago. His smoking use included Cigarettes. He started smoking about 62 years ago. He has a 55 pack-year smoking history. His smokeless tobacco use includes Chew. He reports that he does not drink alcohol or use illicit drugs.  Family History  Problem Relation Age of Onset  . Colon cancer Neg Hx   .  Liver disease Neg Hx   .  Diabetes Father   . Heart failure Other     Review of Systems:  Constitutional: Denies fever, chills, diaphoresis, appetite change and fatigue.  HEENT: Denies photophobia, eye pain, redness, hearing loss, ear pain, congestion, sore throat, rhinorrhea, sneezing, mouth sores, trouble swallowing, neck pain, neck stiffness and tinnitus.   Respiratory: Denies SOB, DOE, cough, chest tightness,  and wheezing.   Cardiovascular: Denies chest pain, palpitations and leg swelling.  Gastrointestinal: Denies nausea, vomiting, abdominal pain, diarrhea, constipation,  and abdominal distention.  Genitourinary: Denies dysuria, urgency, frequency, hematuria, flank pain and difficulty urinating.  Endocrine: Denies: hot or cold intolerance, sweats, changes in hair or nails, polyuria, polydipsia. Musculoskeletal: Denies myalgias, back pain, joint swelling, arthralgias and gait problem.  Skin: Denies pallor, rash and wound.  Neurological: Denies dizziness, seizures, syncope, weakness, light-headedness, numbness and headaches.  Hematological: Denies adenopathy. Easy bruising, personal or family bleeding history  Psychiatric/Behavioral: Denies suicidal ideation, mood changes, confusion, nervousness, sleep disturbance and agitation   Physical Exam: Blood pressure 123/65, pulse 77, temperature 98.2 F (36.8 C), temperature source Oral, resp. rate 14, height 5' 10.5" (1.791 m), weight 97.977 kg (216 lb), SpO2 100 %. General: Alert, awake, oriented 3 HEENT: Normocephalic, atraumatic, pupils equal and reactive to light, dry mucous membranes Neck: Supple, no JVD, lymphadenopathy, no bruits, no goiter Cardiovascular: Regular rate and rhythm Lungs: Clear to auscultation bilaterally Abdomen: Soft, nontender, nondistended, positive bowel sounds next line abdomen: Soft, nontender, nondistended, positive bowel sounds And extremities: Status post left BKA, right with trace pedal edema Neurologic:  Appears grossly intact and nonfocal  Labs on Admission:  Results for orders placed or performed during the hospital encounter of 09/30/15 (from the past 48 hour(s))  Prepare RBC     Status: None   Collection Time: 09/30/15  7:15 AM  Result Value Ref Range   Order Confirmation ORDER PROCESSED BY BLOOD BANK   CBC with Differential/Platelet     Status: Abnormal   Collection Time: 09/30/15  7:18 AM  Result Value Ref Range   WBC 14.2 (H) 4.0 - 10.5 K/uL   RBC 2.65 (L) 4.22 - 5.81 MIL/uL   Hemoglobin 6.3 (LL) 13.0 - 17.0 g/dL    Comment: CRITICAL RESULT CALLED TO, READ BACK BY AND VERIFIED WITH: VOGOLAR,T AT 0731 ON 09/30/2015 BY WOODS,M    HCT 21.4 (L) 39.0 - 52.0 %   MCV 80.8 78.0 - 100.0 fL   MCH 23.8 (L) 26.0 - 34.0 pg   MCHC 29.4 (L) 30.0 - 36.0 g/dL   RDW 18.4 (H) 11.5 - 15.5 %   Platelets 288 150 - 400 K/uL   Neutrophils Relative % 81 %   Neutro Abs 11.5 (H) 1.7 - 7.7 K/uL   Lymphocytes Relative 11 %   Lymphs Abs 1.6 0.7 - 4.0 K/uL   Monocytes Relative 6 %   Monocytes Absolute 0.8 0.1 - 1.0 K/uL   Eosinophils Relative 1 %   Eosinophils Absolute 0.2 0.0 - 0.7 K/uL   Basophils Relative 1 %   Basophils Absolute 0.1 0.0 - 0.1 K/uL  Comprehensive metabolic panel     Status: Abnormal   Collection Time: 09/30/15  7:18 AM  Result Value Ref Range   Sodium 141 135 - 145 mmol/L   Potassium 4.7 3.5 - 5.1 mmol/L   Chloride 109 101 - 111 mmol/L   CO2 24 22 - 32 mmol/L   Glucose, Bld 338 (H) 65 - 99 mg/dL   BUN 44 (H) 6 - 20 mg/dL  Creatinine, Ser 1.90 (H) 0.61 - 1.24 mg/dL   Calcium 8.8 (L) 8.9 - 10.3 mg/dL   Total Protein 5.3 (L) 6.5 - 8.1 g/dL   Albumin 2.9 (L) 3.5 - 5.0 g/dL   AST 17 15 - 41 U/L   ALT 12 (L) 17 - 63 U/L   Alkaline Phosphatase 22 (L) 38 - 126 U/L   Total Bilirubin 0.5 0.3 - 1.2 mg/dL   GFR calc non Af Amer 33 (L) >60 mL/min   GFR calc Af Amer 38 (L) >60 mL/min    Comment: (NOTE) The eGFR has been calculated using the CKD EPI equation. This calculation has  not been validated in all clinical situations. eGFR's persistently <60 mL/min signify possible Chronic Kidney Disease.    Anion gap 8 5 - 15  Protime-INR     Status: Abnormal   Collection Time: 09/30/15  7:18 AM  Result Value Ref Range   Prothrombin Time 40.7 (H) 11.6 - 15.2 seconds   INR 4.39 (H) 0.00 - 1.49  Type and screen     Status: None (Preliminary result)   Collection Time: 09/30/15  7:18 AM  Result Value Ref Range   ABO/RH(D) A POS    Antibody Screen NEG    Sample Expiration 10/03/2015    Unit Number [id number]    Blood Component Type RED CELLS,LR    Unit division 00    Status of Unit ALLOCATED    Transfusion Status OK TO TRANSFUSE    Crossmatch Result Compatible    Unit Number [id number]    Blood Component Type RED CELLS,LR    Unit division 00    Status of Unit ISSUED    Transfusion Status OK TO TRANSFUSE    Crossmatch Result Compatible   POC occult blood, ED     Status: Abnormal   Collection Time: 09/30/15  7:53 AM  Result Value Ref Range   Fecal Occult Bld POSITIVE (A) NEGATIVE    Radiological Exams on Admission: No results found.  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 -We'll keep on clear liquids only and nothing by mouth after midnight in case GI plans on any further endoscopic studies in a.m. -He is currently being transfused 1 unit of PRBCs, however no further blood is available at [facility] I have written orders for a total of 3 units of blood, the remaining 2 units will need to be transfused upon arrival to  [facility].  Acute blood loss anemia -Secondary GI bleed, as above will be transfused 3 units of PRBCs.  History of aortic valve replacement -Maintained on chronic anticoagulation with Coumadin with supratherapeutic INR on admission of 5.95. -This certainly complicates his active GI bleed. At this point have not elected to reverse Coumadin, instead will hold it pending further cardiology recommendations.  Recent non-ST elevated MI -This occurred in December in the context of an acute GI bleed. -We'll transfuse to hemoglobin above 9. -At that time it was decided not to pursue invasive testing such as cardiac catheterization as we could  not commit him to long-term anticoagulation if needed given his repeated episodes of GI bleeding, the same remains true today.  Diabetes mellitus -Check hemoglobin A1c, place on sensitive sliding scale insulin.  Hyperlipidemia -Continue statin  DVT prophylaxis -Already fully anticoagulated  CODE STATUS -Full Code   Time Spent on Admission:  95 minutes  [facility] Pager: [phone] 09/30/2015, 1:53 PM

## 2015-09-30 NOTE — ED Notes (Signed)
Phoned lab to see how much longer for blood. States it has been ready for 20-30 min, never received call.

## 2015-09-30 NOTE — ED Provider Notes (Signed)
CSN: [id number]     Arrival date & time 09/30/15  4235 History   First MD Initiated Contact with Patient 09/30/15 [phone]     Chief Complaint  Patient presents with  . Rectal Bleeding      HPI  Patient presents with chief complaint weakness, black stools this morning. Has history of GI bleeding.  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 DMII, s/p BKA. Ischemic cardiomyopathy EF 40%, HTN,  patient with NSTEMI 12/16, treated medically. Cannot commit to platelet therapy because of recurrent episodes of bleeding. Continues on beta blocker, and Imdur.  States this morning he had a bowel movement that was black. Had another bowel movement that was large loose with "jet black". No frankly bloody stools or bright red blood. Feels weak. Not lightheaded or syncopal. No chest pain. No abdominal pain.    Past Medical History  Diagnosis Date  . Type 2 diabetes mellitus ([place]) 2007  . Essential hypertension   . Gout   . Hypercholesteremia   . Peripheral vascular disease ([place])     Lower extremity PCI/stenting  . COPD (chronic obstructive pulmonary disease) ([place])   . S/P aortic valve replacement 1990    a. St. Jude  . Chronic back pain   . Dysphagia   . Neuromuscular disorder ([place])   . Peripheral neuropathy ([place])   . Critical lower limb ischemia   . CAD (coronary artery disease)     a. 05/13/14 [name] s/p overlapping DESx2 to SVG to RCA. b.  s/p CABG '90 with redo '94 & stent to RCA SVG in 2005  . Chronic toe ulcer ([place])     a. Left foot  . Chronic systolic heart failure ([place])   . History of kidney stones   . GERD (gastroesophageal reflux disease)   . Arthritis   . Sleep apnea   . Myocardial infarction ([place])   . GI bleeding 05/31/2015    Source not identified.  . Acute blood loss anemia 02/2015 & 05/2015    Hemoglobin 5.5 on 05/31/15; status post  transfusion.   Past Surgical History  Procedure Laterality Date  . Aortic valve replacement  1990    St. Jude  . Rotator cuff repair      right  . Cataract extraction      bilateral  . Coronary stent placement  2005    RCA vein graft A 3.0x13.0 TAXUS stent was then placed int he vessel a Viva 3.0x4.0 (perfusion balloon was made ready it was placed through the entire lenght of the stent  . Peripheral vascular procedures lower extremities      Right external iliac  artery PTA and stenting as well as bilateral SFA intervention remotely. Repeat procedures in 2011 bilaterally  . Coronary artery bypass graft  1994    6 vessels  . [name]  06/13/2011    Procedure: Venia Minks DILATION;  Surgeon: [name], MD;  Location: [facility];  Service: Endoscopy;  Laterality: N/A;  Dilated to 56.   . Angioplasty illiac artery    . Back surgery  4315,4008    2  . Eye surgery    . Amputation Left 07/13/2014    Procedure: Transmetatarsal Amputation;  Surgeon: [name], MD;  Location: [facility];  Service: Orthopedics;  Laterality: Left;  . Lower extremity angiogram N/A 02/15/2013    Procedure: LOWER EXTREMITY ANGIOGRAM;  Surgeon:  [name], MD;  Location: [facility] C[facility] LAB;  Service: Cardiovascular;  Laterality: N/A;  . Left heart catheterization with coronary angiogram N/A 05/11/2014    Procedure: LEFT HEART CATHETERIZATION WITH CORONARY ANGIOGRAM;  Surgeon: [name], MD;  Location: [facility] CATH LAB;  Service: Cardiovascular;  Laterality: N/A;  . Percutaneous coronary stent intervention (pci-s) N/A 05/13/2014    Procedure: PERCUTANEOUS CORONARY STENT INTERVENTION (PCI-S);  Surgeon: [name], MD;  Location: [facility] CATH LAB;  Service: Cardiovascular;  Laterality: N/A;  . Lower extremity angiogram N/A 06/06/2014    Procedure: LOWER EXTREMITY ANGIOGRAM;  Surgeon: [name], MD;  Location: [facility] CATH LAB;  Service: Cardiovascular;  Laterality: N/A;  . Amputation Left 08/20/2014     Procedure: Revision Transmetatarsal Amputation versus Below Knee Amputation;  Surgeon: [name], MD;  Location: [place];  Service: Orthopedics;  Laterality: Left;  . Stump revision Left 09/23/2014    Procedure: Revision Left Below Knee Amputation;  Surgeon: [name], MD;  Location: [place];  Service: Orthopedics;  Laterality: Left;  . Stump revision Left 10/13/2014    Procedure: REVISION LEFT BELOW KNEE AMPUTATION STUMP;  Surgeon: [name], MD;  Location: [facility];  Service: Orthopedics;  Laterality: Left;  . Esophagogastroduodenoscopy N/A 02/09/2015    DR. Schooler: Normal EGD  . Esophagogastroduodenoscopy  05/2011    Dr. [name]: s/p esophageal dilation, antral erosions/nodularity with benign biopsies  . Colonoscopy  05/2011    Dr. [name]: benign rectal polyp, left sided tics, ascending colonic ulcers (path c/w ischemia)  . Colonoscopy with propofol N/A 06/01/2015    RMR: normal appearing rectal mucosa. Scattered left-sided diverticula. the remainder of the colonic mucosa appeared normal. the distal 5 cm of terminal ileal mucosa also appeared normal. retroflexion was performed.   Freda Munro capsule study N/A 06/08/2015    Procedure: GIVENS CAPSULE STUDY;  Surgeon: [name], MD;  Location: [facility];  Service: Endoscopy;  Laterality: N/A;  0700   . Esophagogastroduodenoscopy (egd) with propofol N/A 06/20/2015    Procedure: ESOPHAGOGASTRODUODENOSCOPY (EGD) WITH PROPOFOL;  Surgeon: Danie Binder, MD;  Location: [facility];  Service: Endoscopy;  Laterality: N/A;  . Biopsy N/A 06/20/2015    Procedure: BIOPSY;  Surgeon: Danie Binder, MD;  Location: [facility];  Service: Endoscopy;  Laterality: N/A;   Family History  Problem Relation Age of Onset  . Colon cancer Neg Hx   . Liver disease Neg Hx   . Diabetes Father   . Heart failure Other    Social History  Substance Use Topics  . Smoking status: Former Smoker -- 1.00 packs/day for 55 years    Types: Cigarettes    Start date:  08/19/1953    Quit date: 05/07/2014  . Smokeless tobacco: Current User    Types: Chew    Last Attempt to Quit: 08/20/1995     Comment: Has quit on 3 occasions. Counseling given today 5-10 minutes   I am more than likely going to quit "  . Alcohol Use: No     Comment: Socially, sometimes 12 ounce beer daily, may go month without/no whiskey    Review of Systems  Constitutional: Negative for fever, chills, diaphoresis, appetite change and fatigue.  HENT: Negative for mouth sores, sore throat and trouble swallowing.   Eyes: Negative for visual disturbance.  Respiratory: Negative for cough, chest tightness, shortness of breath and wheezing.   Cardiovascular: Negative for chest pain.  Gastrointestinal: Positive for blood in stool. Negative  for nausea, vomiting, abdominal pain, diarrhea and abdominal distention.  Endocrine: Negative for polydipsia, polyphagia and polyuria.  Genitourinary: Negative for dysuria, frequency and hematuria.  Musculoskeletal: Negative for gait problem.  Skin: Negative for color change, pallor and rash.  Neurological: Positive for weakness and light-headedness. Negative for dizziness, syncope and headaches.  Hematological: Does not bruise/bleed easily.  Psychiatric/Behavioral: Negative for behavioral problems and confusion.      Allergies  Review of patient's allergies indicates no known allergies.  Home Medications   Prior to Admission medications   Medication Sig Start Date End Date Taking? Authorizing Provider  albuterol (PROVENTIL) 4 MG tablet Take 4 mg by mouth 3 (three) times daily. 01/20/15   Historical Provider, MD  albuterol-ipratropium (COMBIVENT) 18-103 MCG/ACT inhaler Inhale 1 puff into the lungs 4 (four) times daily. Coughing/ Shortness of Breath    Historical Provider, MD  allopurinol (ZYLOPRIM) 300 MG tablet Take 300 mg by mouth daily.    Historical Provider, MD  CARAFATE 1 GM/10ML suspension TAKE 10 ML BY MOUTH FOUR TIMES DAILY WITH MEALS AND AT  BEDTIME. 08/30/15   Troy Sine, MD  cholecalciferol (VITAMIN D) 1000 UNITS tablet Take 2,000 Units by mouth daily.    Historical Provider, MD  clopidogrel (PLAVIX) 75 MG tablet TAKE ONE TABLET BY MOUTH DAILY WITH BREAKFAST. Patient not taking: Reported on 08/23/2015 08/18/15   [name], MD  fenofibrate (TRICOR) 145 MG tablet TAKE 1 TABLET BY MOUTH ONCE DAILY FOR CHOLESTEROL. 08/18/15   [name], MD  ferrous sulfate 325 (65 FE) MG EC tablet Take 1 tablet (325 mg total) by mouth daily with breakfast. 06/02/15   Rexene Alberts, MD  fish oil-omega-3 fatty acids 1000 MG capsule Take 1 capsule (1 g total) by mouth 2 (two) times daily. 01/22/13   Casimiro Needle [place], RPH-CPP  furosemide (LASIX) 80 MG tablet Take 80 mg by mouth daily. 07/21/15   Historical Provider, MD  glimepiride (AMARYL) 1 MG tablet Take 1 mg by mouth 2 (two) times daily.    Historical Provider, MD  isosorbide mononitrate (IMDUR) 60 MG 24 hr tablet Take 1 tablet (60 mg total) by mouth daily. 07/27/15   Troy Sine, MD  metoprolol succinate (TOPROL-XL) 50 MG 24 hr tablet Take 50 mg by mouth daily. 06/22/15   Historical Provider, MD  NITROSTAT 0.4 MG SL tablet PLACE 1 TAB UNDER TONGUE EVERY 5 MIN IF NEEDED FOR CHEST PAIN. MAY USE 3 TIMES.NO RELIEF CALL 911. 07/20/15   Eileen Stanford, PA-C  oxyCODONE-acetaminophen (PERCOCET) 10-325 MG per tablet Take 1 tablet by mouth every 4 (four) hours as needed for pain. Patient taking differently: Take 1 tablet by mouth every 3 (three) hours as needed for pain.  09/23/14   [name], MD  pantoprazole (PROTONIX) 40 MG tablet Take 1 tablet (40 mg total) by mouth 2 (two) times daily before a meal. 06/23/15   Erline Hau, MD  potassium chloride SA (K-DUR,KLOR-CON) 20 MEQ tablet Take 20 mEq by mouth daily.      Historical Provider, MD  pregabalin (LYRICA) 50 MG capsule Take one capsule by mouth twice daily for pains Patient taking differently: Take 50 mg by mouth 3 (three)  times daily.  08/23/14   Blanchie Serve, MD  ramipril (ALTACE) 2.5 MG capsule Take 1 capsule (2.5 mg total) by mouth daily. 08/22/15   [name], MD  ramipril (ALTACE) 5 MG capsule TAKE 1 CAPSULE BY MOUTH EVERY MORNING. 08/18/15  [name], MD  rosuvastatin (CRESTOR) 20 MG tablet TAKE (1) TABLET BY MOUTH AT BEDTIME FOR CHOLESTEROL. 08/18/15   [name], MD  sitaGLIPtin (JANUVIA) 50 MG tablet Take 50 mg by mouth daily.    Historical Provider, MD  vitamin C (ASCORBIC ACID) 500 MG tablet Take 500 mg by mouth daily.    Historical Provider, MD  warfarin (COUMADIN) 2.5 MG tablet Take 1 tablet (2.5 mg total) by mouth daily. 08/10/15   Orvan Falconer, MD   BP 117/41 mmHg  Pulse 80  Temp(Src) 98.6 F (37 C) (Oral)  Resp 21  Ht 5' 10.5" (1.791 m)  Wt 216 lb (97.977 kg)  BMI 30.54 kg/m2  SpO2 100% Physical Exam  Constitutional: He is oriented to person, place, and time. He appears well-developed and well-nourished. No distress.  HENT:  Head: Normocephalic.  Eyes: Conjunctivae are normal. Pupils are equal, round, and reactive to light. No scleral icterus.  Conjunctiva appear pale.  Neck: Normal range of motion. Neck supple. No thyromegaly present.  Cardiovascular: Normal rate and regular rhythm.  Exam reveals no gallop and no friction rub.   No murmur heard. Pulmonary/Chest: Effort normal and breath sounds normal. No respiratory distress. He has no wheezes. He has no rales.  Abdominal: Soft. Bowel sounds are normal. He exhibits no distension. There is no tenderness. There is no rebound.  Soft benign abdomen. Complains of no pain. Has reproducible tenderness. Rectal is black stool, guaiac positive.  Musculoskeletal: Normal range of motion.  Neurological: He is alert and oriented to person, place, and time.  Skin: Skin is warm and dry. No rash noted.  Psychiatric: He has a normal mood and affect. His behavior is normal.    ED Course  Procedures (including critical care time) Labs  Review Labs Reviewed  CBC WITH DIFFERENTIAL/PLATELET - Abnormal; Notable for the following:    WBC 14.2 (*)    RBC 2.65 (*)    Hemoglobin 6.3 (*)    HCT 21.4 (*)    MCH 23.8 (*)    MCHC 29.4 (*)    RDW 18.4 (*)    Neutro Abs 11.5 (*)    All other components within normal limits  COMPREHENSIVE METABOLIC PANEL - Abnormal; Notable for the following:    Glucose, Bld 338 (*)    BUN 44 (*)    Creatinine, Ser 1.90 (*)    Calcium 8.8 (*)    Total Protein 5.3 (*)    Albumin 2.9 (*)    ALT 12 (*)    Alkaline Phosphatase 22 (*)    GFR calc non Af Amer 33 (*)    GFR calc Af Amer 38 (*)    All other components within normal limits  PROTIME-INR - Abnormal; Notable for the following:    Prothrombin Time 40.7 (*)    INR 4.39 (*)    All other components within normal limits  POC OCCULT BLOOD, ED - Abnormal; Notable for the following:    Fecal Occult Bld POSITIVE (*)    All other components within normal limits  TYPE AND SCREEN  PREPARE RBC (CROSSMATCH)    Imaging Review No results found. I have personally reviewed and evaluated these images and lab results as part of my medical decision-making.   EKG Interpretation   Date/Time:  Saturday September 30 2015 07:00:08 EST Ventricular Rate:  75 PR Interval:  62 QRS Duration: 137 QT Interval:  436 QTC Calculation: 487 R Axis:   18 Text Interpretation:  Sinus rhythm  Ventricular bigeminy Short PR interval  Left bundle branch block Confirmed by Jeneen Rinks  MD, [place] ([id number]) on 09/30/2015  7:28:47 AM      MDM   Final diagnoses:  None   Hb of 6.3.  I have ordered transfusion.  Initial BP 97/.  Given 250 ivf. Transfusion pending. Patient will cry admission. In review of his previous chart he has had bleeding on endoscopy from erosive gastritis. He is supratherapeutic on INR 4.3. Started on Protonix bolus and drip. I have placed a call hospitalist regarding admission.  CRITICAL CARE Performed by: Tanna Furry JOSEPH   Total critical care  time: 35 minutes  Critical care time was exclusive of separately billable procedures and treating other patients.  Critical care was necessary to treat or prevent imminent or life-threatening deterioration.  Critical care was time spent personally by me on the following activities: development of treatment plan with patient and/or surrogate as well as nursing, discussions with consultants, evaluation of patient's response to treatment, examination of patient, obtaining history from patient or surrogate, ordering and performing treatments and interventions, ordering and review of laboratory studies, ordering and review of radiographic studies, pulse oximetry and re-evaluation of patient's condition. care  Tanna Furry, MD 09/30/15 [phone]

## 2015-09-30 NOTE — ED Notes (Signed)
Pt given small amount of water per hospitalist.

## 2015-09-30 NOTE — Progress Notes (Signed)
ANTICOAGULATION CONSULT NOTE - Initial Consult  Pharmacy Consult for warfarin Indication: mechanical aortic valve  No Known Allergies  Patient Measurements: Height: '5\' 10"'$  (177.8 cm) Weight: 206 lb 12.8 oz (93.804 kg) IBW/kg (Calculated) : 73  Vital Signs: Temp: 98.4 F (36.9 C) (02/11 1646) Temp Source: Oral (02/11 1646) BP: 149/56 mmHg (02/11 1646) Pulse Rate: 93 (02/11 1646)  Labs:  Recent Labs  09/30/15 0718  HGB 6.3*  HCT 21.4*  PLT 288  LABPROT 40.7*  INR 4.39*  CREATININE 1.90*    Estimated Creatinine Clearance: 39.2 mL/min (by C-G formula based on Cr of 1.9).  Assessment: 75 yo male presenting to [facility] for melana - transferred since [facility] is out of blood  PMH: aortic mechanical valve on warfarin, erosive gastritis  AC: on warfarin for AVR. Admit INR 4.39  PTA warfarin 5 mg Thu/Sun, 2.5 mg AOD  Renal: SCr 1.90  GI: melana - 1 PRBC @ [facility] then 3 here  Heme: H&H 6.3/21.4, Plt 288  Goal of Therapy:  INR 2.5 - 3.5   Plan:  No warfarin tonight Pending cardiology recs for continuation of warfarin? Daily INR  Levester Fresh, PharmD, BCPS, [facility] Clinical Pharmacist Pager [phone] 09/30/2015 6:47 PM

## 2015-09-30 NOTE — ED Notes (Signed)
Pt states he has had two dark tarry stools this morning

## 2015-09-30 NOTE — Progress Notes (Signed)
09/30/2015 patient transfer from [facility] Emergency room to 2 central at 1645. Patient transfer via care link with with second unit of blood running. He is alert, oriented and normally ambulate with prosthesis on the left leg. Prosthesis at home. Patient have a left bka. Patient sacrum is a little pink but blanches. He is hard of hearing mostly in the left ear. Patient was place on telemetry, CHG and MRSA was completed. MD was made aware patient was on unit. [facility] RN.

## 2015-10-01 DIAGNOSIS — N179 Acute kidney failure, unspecified: Secondary | ICD-10-CM

## 2015-10-01 DIAGNOSIS — Z954 Presence of other heart-valve replacement: Secondary | ICD-10-CM

## 2015-10-01 DIAGNOSIS — Z7901 Long term (current) use of anticoagulants: Secondary | ICD-10-CM

## 2015-10-01 DIAGNOSIS — E1122 Type 2 diabetes mellitus with diabetic chronic kidney disease: Secondary | ICD-10-CM

## 2015-10-01 DIAGNOSIS — D62 Acute posthemorrhagic anemia: Secondary | ICD-10-CM

## 2015-10-01 DIAGNOSIS — K922 Gastrointestinal hemorrhage, unspecified: Secondary | ICD-10-CM

## 2015-10-01 DIAGNOSIS — N181 Chronic kidney disease, stage 1: Secondary | ICD-10-CM

## 2015-10-01 LAB — CBC
HCT: 23.4 % — ABNORMAL LOW (ref 39.0–52.0)
HCT: 29.8 % — ABNORMAL LOW (ref 39.0–52.0)
HEMATOCRIT: 24.2 % — AB (ref 39.0–52.0)
HEMOGLOBIN: 9.2 g/dL — AB (ref 13.0–17.0)
Hemoglobin: 7.3 g/dL — ABNORMAL LOW (ref 13.0–17.0)
Hemoglobin: 7.6 g/dL — ABNORMAL LOW (ref 13.0–17.0)
MCH: 25.3 pg — AB (ref 26.0–34.0)
MCH: 25.2 pg — ABNORMAL LOW (ref 26.0–34.0)
MCH: 25.3 pg — AB (ref 26.0–34.0)
MCHC: 31.2 g/dL (ref 30.0–36.0)
MCHC: 31.4 g/dL (ref 30.0–36.0)
MCHC: 30.9 g/dL (ref 30.0–36.0)
MCV: 81 fL (ref 78.0–100.0)
MCV: 80.4 fL (ref 78.0–100.0)
MCV: 81.9 fL (ref 78.0–100.0)
PLATELETS: 177 10*3/uL (ref 150–400)
PLATELETS: 171 10*3/uL (ref 150–400)
Platelets: 180 10*3/uL (ref 150–400)
RBC: 2.89 MIL/uL — ABNORMAL LOW (ref 4.22–5.81)
RBC: 3.01 MIL/uL — ABNORMAL LOW (ref 4.22–5.81)
RBC: 3.64 MIL/uL — ABNORMAL LOW (ref 4.22–5.81)
RDW: 16.7 % — AB (ref 11.5–15.5)
RDW: 16.7 % — AB (ref 11.5–15.5)
RDW: 17 % — AB (ref 11.5–15.5)
WBC: 10.9 10*3/uL — ABNORMAL HIGH (ref 4.0–10.5)
WBC: 10.2 10*3/uL (ref 4.0–10.5)
WBC: 11.2 10*3/uL — ABNORMAL HIGH (ref 4.0–10.5)

## 2015-10-01 LAB — TYPE AND SCREEN
ABO/RH(D): A POS
Antibody Screen: NEGATIVE
UNIT DIVISION: 0
Unit division: 0

## 2015-10-01 LAB — BASIC METABOLIC PANEL
ANION GAP: 8 (ref 5–15)
BUN: 54 mg/dL — ABNORMAL HIGH (ref 6–20)
CALCIUM: 8.6 mg/dL — AB (ref 8.9–10.3)
CO2: 23 mmol/L (ref 22–32)
CREATININE: 1.42 mg/dL — AB (ref 0.61–1.24)
Chloride: 114 mmol/L — ABNORMAL HIGH (ref 101–111)
GFR, EST AFRICAN AMERICAN: 55 mL/min — AB (ref >60)
GFR, EST NON AFRICAN AMERICAN: 47 mL/min — AB (ref >60)
Glucose, Bld: 177 mg/dL — ABNORMAL HIGH (ref 65–99)
Potassium: 4.5 mmol/L (ref 3.5–5.1)
SODIUM: 145 mmol/L (ref 135–145)

## 2015-10-01 LAB — PREPARE RBC (CROSSMATCH)

## 2015-10-01 LAB — GLUCOSE, CAPILLARY
GLUCOSE-CAPILLARY: 202 mg/dL — AB (ref 65–99)
GLUCOSE-CAPILLARY: 165 mg/dL — AB (ref 65–99)
GLUCOSE-CAPILLARY: 151 mg/dL — AB (ref 65–99)
Glucose-Capillary: 166 mg/dL — ABNORMAL HIGH (ref 65–99)

## 2015-10-01 LAB — PROTIME-INR
INR: 3.24 — AB (ref 0.00–1.49)
Prothrombin Time: 32.5 seconds — ABNORMAL HIGH (ref 11.6–15.2)

## 2015-10-01 MED ORDER — FUROSEMIDE 10 MG/ML IJ SOLN
20.0000 mg | Freq: Once | INTRAMUSCULAR | Status: AC
  Administered 2015-10-01: 20 mg via INTRAVENOUS
  Filled 2015-10-01: qty 2

## 2015-10-01 MED ORDER — SODIUM CHLORIDE 0.9 % IV SOLN
Freq: Once | INTRAVENOUS | Status: AC
  Administered 2015-10-01: 11:00:00 via INTRAVENOUS

## 2015-10-01 MED ORDER — MORPHINE SULFATE (PF) 2 MG/ML IV SOLN
2.0000 mg | INTRAVENOUS | Status: DC | PRN
Start: 2015-10-01 — End: 2015-10-02
  Administered 2015-10-01 – 2015-10-02 (×3): 2 mg via INTRAVENOUS
  Filled 2015-10-01 (×3): qty 1

## 2015-10-01 NOTE — Progress Notes (Signed)
ANTICOAGULATION CONSULT NOTE - Follow Up Consult  Pharmacy Consult for Coumadin Indication: AVR  No Known Allergies  Patient Measurements: Height: '5\' 10"'$  (177.8 cm) Weight: 206 lb 12.8 oz (93.804 kg) IBW/kg (Calculated) : 73 Heparin Dosing Weight:   Vital Signs: Temp: 97.9 F (36.6 C) (02/12 1423) Temp Source: Oral (02/12 1423) BP: 138/51 mmHg (02/12 1423) Pulse Rate: 65 (02/12 1423)  Labs:  Recent Labs  09/30/15 0718 09/30/15 1851 10/01/15 0421 10/01/15 0905  HGB 6.3* 7.5* 7.3* 7.6*  HCT 21.4* 24.3* 23.4* 24.2*  PLT 288 227 177 171  LABPROT 40.7*  --  32.5*  --   INR 4.39*  --  3.24*  --   CREATININE 1.90*  --  1.42*  --     Estimated Creatinine Clearance: 52.5 mL/min (by C-G formula based on Cr of 1.42).   Medications:  Scheduled:  . sodium chloride   Intravenous Once  . sodium chloride  10 mL/hr Intravenous Once  . insulin aspart  0-5 Units Subcutaneous QHS  . insulin aspart  0-9 Units Subcutaneous TID WC  . insulin aspart  3 Units Subcutaneous TID WC  . isosorbide mononitrate  60 mg Oral Daily  . metoprolol succinate  25 mg Oral Daily  . omega-3 acid ethyl esters  1 g Oral BID  . pantoprazole (PROTONIX) IV  40 mg Intravenous Q12H  . pregabalin  50 mg Oral TID  . ramipril  2.5 mg Oral Daily  . rosuvastatin  20 mg Oral q1800  . sucralfate  1 g Oral TID WC & HS    Assessment: 75yo with St.Jude AVR and GI bleed on Coumadin/Plavix pta, currently being evaluated by Gastroenterology.  INR 3.24 this AM.  Hg 7.6 s/p PRBC x 3 and pltc wnl.  Pt is on Protonix bid and is to have an EGD.  Plan is to eventually resume Coumadin only.  Goal of Therapy:  INR 2-3 Monitor platelets by anticoagulation protocol: Yes   Plan:  No Coumadin F/U plans to resume  Gracy Bruins, [facility]

## 2015-10-01 NOTE — Progress Notes (Signed)
[facility] PROGRESS NOTE    Progress Note   Tanner Pena WUX:[id number] DOB: [birth year] DOA: 09/30/2015 PCP: Tanner Pena., MD   Brief Narrative:   Tanner Pena is an 75 y.o. male prior admissions for melena and non-ST elevated MI, This is complicated by an aortic mechanical valve that requires anticoagulation with Coumadin. On prior endoscopic studies he was found to have erosive gastritis and has been maintained on twice daily PPI and Carafate  Assessment/Plan:  Melena/ ? Due to  UGIB (upper gastrointestinal bleed)/acute blood loss anemia: On Protonix twice a day and Carafate. Status post 3 units of packed red blood cells. Hemoglobin is slowly trending up. Patient continues to be nothing by mouth. GI has already been consulted. Goal hemoglobin is greater than 9 and 2 additional units of packed red blood cells and Lasix in between.  H/O aortic valve replacement- St Jude: INR supratherapeutic on admission Coumadin was held his INR is trending down. Continue to trend hemoglobin. Cardiology recommendations are pending.  DM2 (diabetes mellitus, type 2) ([place]): Hemoglobin A1c is pending continue sliding scale insulin.  Hyperlipidemia with target LDL less than 70 Continue statins.  Cardiomyopathy, ischemic - EF 25-30% by echo 5/14  Recent non-ST elevation MI: On 12 2016, in the context of acute GI bleed. Due to his episode of recent MI his goals be to keep his hemoglobin above 9. During his admission on December invasive testing was not pursued as he would not commit to his long-term anticoagulation. Due to his repeated episodes of GI bleeding.  Acute kidney injury: Likely prerenal in the setting of acute GI bleed. Has improved with transfusions back with blood cells. He will receive 2 additional units of packed red blood cells will check a basic metabolic panel in the morning.  DVT Prophylaxis - coumadin  Family Communication: none Disposition Plan:  Home when stable. Code Status:     Code Status Orders        Start     Ordered   09/30/15 1639  Full code   Continuous     09/30/15 1638    Code Status History    Date Active Date Inactive Code Status Order ID Comments User Context   08/07/2015  5:58 PM 08/10/2015  5:06 PM Full Code [id number]  Erline Hau, MD Inpatient   07/04/2015  9:18 PM 07/09/2015  2:43 PM Full Code [id number]  Orvan Falconer, MD Inpatient   06/14/2015  9:51 PM 06/23/2015  4:21 PM Full Code [id number]  Theressa Millard, MD Inpatient   05/30/2015  5:29 PM 06/02/2015  7:35 PM Full Code [id number]  Kathie Dike, MD Inpatient   03/01/2015  5:12 PM 03/11/2015  4:39 PM Full Code [id number]  Doree Albee, MD ED   02/09/2015  3:10 PM 02/12/2015  3:00 PM Full Code [id number]  Delfina Redwood, MD Inpatient   02/04/2015  9:22 AM 02/09/2015  3:10 PM DNR [id number]  Domenic Polite, MD Inpatient   12/28/2014  5:27 AM 01/01/2015  3:16 PM Full Code [id number]  Lavina Hamman, MD ED   10/13/2014 11:58 AM 10/14/2014  9:00 PM Full Code [id number]  [name], MD Inpatient   08/20/2014  9:35 AM 08/22/2014  5:33 PM Full Code [id number]  [name], MD Inpatient   07/13/2014 11:20 AM 07/14/2014  3:35 PM Full Code [id number]  [name], MD Inpatient   06/06/2014  2:31 PM 06/08/2014  5:25 PM Full Code [id number]  [name],  MD Inpatient   05/13/2014  4:07 PM 05/17/2014  6:00 PM Full Code [id number]  [name], MD Inpatient   05/11/2014  4:32 PM 05/13/2014  4:07 PM Full Code [id number]  [name], MD Inpatient   05/07/2014 11:39 PM 05/11/2014  4:32 PM Full Code [id number]  Phillips Grout, MD Inpatient   08/11/2013 12:37 AM 08/16/2013  4:06 PM Full Code [id number]  Oswald Hillock, MD Inpatient        IV Access:    Peripheral IV   Procedures and diagnostic studies:   No results found.   Medical Consultants:    None.  Anti-Infectives:   Anti-infectives    None      Subjective:     [name] this having some epigastric discomfort.  Objective:    Filed Vitals:   09/30/15 2312 10/01/15 0000 10/01/15 0134 10/01/15 0400  BP: 132/60 113/54 106/45 106/42  Pulse: 72 34  35  Temp: 97.8 F (36.6 C) 98.7 F (37.1 C) 98.8 F (37.1 C) 98.6 F (37 C)  TempSrc: Oral Oral Axillary Axillary  Resp: '22 21 21 18  '$ Height:      Weight:      SpO2: 100% 97% 97% 98%    Intake/Output Summary (Last 24 hours) at 10/01/15 0742 Last data filed at 10/01/15 0600  Gross per 24 hour  Intake 2587.5 ml  Output    900 ml  Net 1687.5 ml   Filed Weights   09/30/15 0701 09/30/15 1646  Weight: 97.977 kg (216 lb) 93.804 kg (206 lb 12.8 oz)    Exam: Gen:  NAD Cardiovascular:  RRR. Chest and lungs:   CTAB Abdomen:  Abdomen soft, NT/ND, + BS Extremities:  No edema   Data Reviewed:    Labs: Basic Metabolic Panel:  Recent Labs Lab 09/30/15 0718 10/01/15 0421  NA 141 145  K 4.7 4.5  CL 109 114*  CO2 24 23  GLUCOSE 338* 177*  BUN 44* 54*  CREATININE 1.90* 1.42*  CALCIUM 8.8* 8.6*   GFR Estimated Creatinine Clearance: 52.5 mL/min (by C-G formula based on Cr of 1.42). Liver Function Tests:  Recent Labs Lab 09/30/15 0718  AST 17  ALT 12*  ALKPHOS 22*  BILITOT 0.5  PROT 5.3*  ALBUMIN 2.9*   No results for input(s): LIPASE, AMYLASE in the last 168 hours. No results for input(s): AMMONIA in the last 168 hours. Coagulation profile  Recent Labs Lab 09/27/15 09/27/15 1345 09/30/15 0718 10/01/15 0421  INR 3.7* 3.7* 4.39* 3.24*    CBC:  Recent Labs Lab 09/30/15 0718 09/30/15 1851 10/01/15 0421  WBC 14.2* 12.9* 10.9*  NEUTROABS 11.5*  --   --   HGB 6.3* 7.5* 7.3*  HCT 21.4* 24.3* 23.4*  MCV 80.8 79.7 81.0  PLT 288 227 177   Cardiac Enzymes: No results for input(s): CKTOTAL, CKMB, CKMBINDEX, TROPONINI in the last 168 hours. BNP (last 3 results) No results for input(s): PROBNP in the last 8760 hours. CBG:  Recent Labs Lab  09/30/15 1548 09/30/15 1635 09/30/15 2206  GLUCAP 274* 281* 221*   D-Dimer: No results for input(s): DDIMER in the last 72 hours. Hgb A1c: No results for input(s): HGBA1C in the last 72 hours. Lipid Profile: No results for input(s): CHOL, HDL, LDLCALC, TRIG, CHOLHDL, LDLDIRECT in the last 72 hours. Thyroid function studies: No results for input(s): TSH, T4TOTAL, T3FREE, THYROIDAB in the last 72 hours.  Invalid input(s): FREET3 Anemia work up: No results for input(s):  VITAMINB12, FOLATE, FERRITIN, TIBC, IRON, RETICCTPCT in the last 72 hours. Sepsis Labs:  Recent Labs Lab 09/30/15 0718 09/30/15 1851 10/01/15 0421  WBC 14.2* 12.9* 10.9*   Microbiology Recent Results (from the past 240 hour(s))  MRSA PCR Screening     Status: None   Collection Time: 09/30/15  5:14 PM  Result Value Ref Range Status   MRSA by PCR NEGATIVE NEGATIVE Final    Comment:        The GeneXpert MRSA Assay (FDA approved for NASAL specimens only), is one component of a comprehensive MRSA colonization surveillance program. It is not intended to diagnose MRSA infection nor to guide or monitor treatment for MRSA infections.      Medications:   . sodium chloride   Intravenous Once  . sodium chloride  10 mL/hr Intravenous Once  . insulin aspart  0-5 Units Subcutaneous QHS  . insulin aspart  0-9 Units Subcutaneous TID WC  . insulin aspart  3 Units Subcutaneous TID WC  . isosorbide mononitrate  60 mg Oral Daily  . metoprolol succinate  25 mg Oral Daily  . omega-3 acid ethyl esters  1 g Oral BID  . pantoprazole (PROTONIX) IV  40 mg Intravenous Q12H  . pregabalin  50 mg Oral TID  . ramipril  2.5 mg Oral Daily  . rosuvastatin  20 mg Oral q1800  . sucralfate  1 g Oral TID WC & HS   Continuous Infusions: . sodium chloride 75 mL/hr at 09/30/15 1854    Time spent: 25 min   LOS: 1 day   Charlynne Cousins  [facility] Pager [phone]  *Please refer to [email or web address], password [email or web address] to get  updated schedule on who will round on this patient, as hospitalists switch teams weekly. If 7PM-7AM, please contact night-coverage at [email or web address], password [email or web address] for any overnight needs.  10/01/2015, 7:42 AM

## 2015-10-01 NOTE — Consult Note (Signed)
CARDIOLOGY CONSULT NOTE   Patient ID: Tanner Pena MRN: [id number], DOB/AGE: [birth year]   Admit date: 09/30/2015 Date of Consult: 10/01/2015   Primary Physician: [name], MD Primary Cardiologist: Dr. [name]  Pt. Profile  The patient is a 75 year old male with past medical history of HTN, DM II, OSA, carotid artery stenosis, CAD s/p CABG 1991 with redo 1994, PVD s/p bilateral LE intervention, s/p L BKA and h/o AS s/p St Jude AVR presented with supratherapeutic INR and GI bleed.   Problem List  Past Medical History  Diagnosis Date  . Type 2 diabetes mellitus ([place]) 2007  . Essential hypertension   . Gout   . Hypercholesteremia   . Peripheral vascular disease ([place])     Lower extremity PCI/stenting  . COPD (chronic obstructive pulmonary disease) ([place])   . S/P aortic valve replacement 1990    a. St. Jude  . Chronic back pain   . Dysphagia   . Neuromuscular disorder ([place])   . Peripheral neuropathy ([place])   . Critical lower limb ischemia   . CAD (coronary artery disease)     a. 05/13/14 [name] s/p overlapping DESx2 to SVG to RCA. b.  s/p CABG '90 with redo '94 & stent to RCA SVG in 2005  . Chronic toe ulcer ([place])     a. Left foot  . Chronic systolic heart failure ([place])   . History of kidney stones   . GERD (gastroesophageal reflux disease)   . Arthritis   . Sleep apnea   . Myocardial infarction ([place])   . GI bleeding 05/31/2015    Source not identified.  . Acute blood loss anemia 02/2015 & 05/2015    Hemoglobin 5.5 on 05/31/15; status post transfusion.    Past Surgical History  Procedure Laterality Date  . Aortic valve replacement  1990    St. Jude  . Rotator cuff repair      right  . Cataract extraction      bilateral  . Coronary stent placement  2005    RCA vein graft A 3.0x13.0 TAXUS stent was then placed int he vessel a Viva 3.0x4.0 (perfusion balloon was made ready it was placed through the entire lenght of the stent  . Peripheral vascular procedures  lower extremities      Right external iliac  artery PTA and stenting as well as bilateral SFA intervention remotely. Repeat procedures in 2011 bilaterally  . Coronary artery bypass graft  1994    6 vessels  . [name]  06/13/2011    Procedure: Venia Minks DILATION;  Surgeon: [name], MD;  Location: [facility];  Service: Endoscopy;  Laterality: N/A;  Dilated to 56.   . Angioplasty illiac artery    . Back surgery  [id number]    2  . Eye surgery    . Amputation Left 07/13/2014    Procedure: Transmetatarsal Amputation;  Surgeon: [name], MD;  Location: [place];  Service: Orthopedics;  Laterality: Left;  . Lower extremity angiogram N/A 02/15/2013    Procedure: LOWER EXTREMITY ANGIOGRAM;  Surgeon: [name], MD;  Location: [facility] CATH LAB;  Service: Cardiovascular;  Laterality: N/A;  . Left heart catheterization with coronary angiogram N/A 05/11/2014    Procedure: LEFT HEART CATHETERIZATION WITH CORONARY ANGIOGRAM;  Surgeon: [name], MD;  Location: [facility] CATH LAB;  Service: Cardiovascular;  Laterality: N/A;  . Percutaneous coronary stent intervention (pci-s) N/A 05/13/2014    Procedure: PERCUTANEOUS CORONARY STENT INTERVENTION (PCI-S);  Surgeon: Charlann Lange  Irish Lack, MD;  Location: [facility] CATH LAB;  Service: Cardiovascular;  Laterality: N/A;  . Lower extremity angiogram N/A 06/06/2014    Procedure: LOWER EXTREMITY ANGIOGRAM;  Surgeon: [name], MD;  Location: [facility] CATH LAB;  Service: Cardiovascular;  Laterality: N/A;  . Amputation Left 08/20/2014    Procedure: Revision Transmetatarsal Amputation versus Below Knee Amputation;  Surgeon: [name], MD;  Location: [place];  Service: Orthopedics;  Laterality: Left;  . Stump revision Left 09/23/2014    Procedure: Revision Left Below Knee Amputation;  Surgeon: [name], MD;  Location: [place];  Service: Orthopedics;  Laterality: Left;  . Stump revision Left 10/13/2014    Procedure: REVISION LEFT BELOW KNEE AMPUTATION STUMP;   Surgeon: [name], MD;  Location: [facility];  Service: Orthopedics;  Laterality: Left;  . Esophagogastroduodenoscopy N/A 02/09/2015    DR. Schooler: Normal EGD  . Esophagogastroduodenoscopy  05/2011    Dr. [name]: s/p esophageal dilation, antral erosions/nodularity with benign biopsies  . Colonoscopy  05/2011    Dr. [name]: benign rectal polyp, left sided tics, ascending colonic ulcers (path c/w ischemia)  . Colonoscopy with propofol N/A 06/01/2015    RMR: normal appearing rectal mucosa. Scattered left-sided diverticula. the remainder of the colonic mucosa appeared normal. the distal 5 cm of terminal ileal mucosa also appeared normal. retroflexion was performed.   Freda Munro capsule study N/A 06/08/2015    Procedure: GIVENS CAPSULE STUDY;  Surgeon: [name], MD;  Location: [facility];  Service: Endoscopy;  Laterality: N/A;  0700   . Esophagogastroduodenoscopy (egd) with propofol N/A 06/20/2015    Procedure: ESOPHAGOGASTRODUODENOSCOPY (EGD) WITH PROPOFOL;  Surgeon: Danie Binder, MD;  Location: [facility];  Service: Endoscopy;  Laterality: N/A;  . Biopsy N/A 06/20/2015    Procedure: BIOPSY;  Surgeon: Danie Binder, MD;  Location: [facility];  Service: Endoscopy;  Laterality: N/A;     Allergies  No Known Allergies  HPI   The patient is a 75 year old male with past medical history of HTN, DM II, OSA, carotid artery stenosis, CAD s/p CABG 1991 with redo 1994, PVD s/p bilateral LE intervention, s/p L BKA and h/o AS s/p St Jude AVR. Patient initially underwent CABG revascularization surgery to his RCA and St. Jude aortic valve replacement for aortic valve stenosis in 1991. In 1994, he required redo CABG to his left coronary system which was not bypassed in 1991. He had stent to the RCA vein graft in 2005. Last cardiac catheterization was performed in September 2015 by Dr. Irish Lack where he received 3 overlapping [place] DES to SVG to RCA. Since then, he has had left BKA. He had multiple upper  GI bleed on Coumadin therapy and has been worked up extensively by GI team. He was eventually taken off aspirin and continue on Plavix and Coumadin. He had a limited echo in November 2016 which showed EF 40%, diffuse hypokinesis, grade 2 diastolic dysfunction, not MR, aortic valve with mean gradient 15 mmHg. His last Myoview was obtained on 08/03/2015 which showed no ischemia, low risk, perfusion abnormality consistent with previous infarction. He was admitted at [facility] on 08/08/2015 with chest pain, the chest pain was felt to be atypical, no further workup was done. Patient has been living with his nephew, despite left BKA, he has been able to take care of himself okay. He denies any recent fall or imbalance issue. He denies any recent significant discomfort including shortness breath or significant chest discomfort.  He  was in his usual state of health until roughly 4 AM in the morning of 09/30/2015 when he had "jet black stool" which is new for him. His INR has been high recently. INR on 09/27/2015 was 3.7. Repeat INR on 09/30/2015 on arrival was 4.39. He was seen at [facility], hemoglobin was 6.3. He also appears to have some degree of acute on chronic renal insufficiency with creatinine 1.9. White blood cell count was elevated at 14.2. He was given packed red blood cell transfusion. Fecal occult blood was positive. He was transferred to [facility] for further evaluation. EKG showed normal sinus rhythm with left bundle branch block and frequent PVCs. Cardiology consulted for management of anticoagulation   Inpatient Medications  . sodium chloride   Intravenous Once  . sodium chloride  10 mL/hr Intravenous Once  . sodium chloride   Intravenous Once  . furosemide  20 mg Intravenous Once  . insulin aspart  0-5 Units Subcutaneous QHS  . insulin aspart  0-9 Units Subcutaneous TID WC  . insulin aspart  3 Units Subcutaneous TID WC  . isosorbide mononitrate  60 mg Oral Daily  .  metoprolol succinate  25 mg Oral Daily  . omega-3 acid ethyl esters  1 g Oral BID  . pantoprazole (PROTONIX) IV  40 mg Intravenous Q12H  . pregabalin  50 mg Oral TID  . ramipril  2.5 mg Oral Daily  . rosuvastatin  20 mg Oral q1800  . sucralfate  1 g Oral TID WC & HS    Family History Family History  Problem Relation Age of Onset  . Colon cancer Neg Hx   . Liver disease Neg Hx   . Diabetes Father   . Heart failure Other      Social History Social History   Social History  . Marital Status: Legally Separated    Spouse Name: N/A  . Number of Children: 5  . Years of Education: N/A   Occupational History  . retired     Stage manager   Social History Main Topics  . Smoking status: Former Smoker -- 1.00 packs/day for 55 years    Types: Cigarettes    Start date: 08/19/1953    Quit date: 05/07/2014  . Smokeless tobacco: Current User    Types: Chew    Last Attempt to Quit: 08/20/1995     Comment: Has quit on 3 occasions. Counseling given today 5-10 minutes   I am more than likely going to quit "  . Alcohol Use: No     Comment: Socially, sometimes 12 ounce beer daily, may go month without/no whiskey  . Drug Use: No  . Sexual Activity: Not Currently   Other Topics Concern  . Not on file   Social History Narrative   Has 3 daughters   Has 2 sons     Review of Systems  General:  No chills, fever, night sweats or weight changes.  Cardiovascular:  No chest pain, dyspnea on exertion, edema, orthopnea, palpitations, paroxysmal nocturnal dyspnea. Dermatological: No rash, lesions/masses Respiratory: No cough, dyspnea Urologic: No hematuria, dysuria Abdominal:   No nausea, vomiting, diarrhea, bright red blood per rectum, or hematemesis +melena Neurologic:  No visual changes, wkns, changes in mental status. All other systems reviewed and are otherwise negative except as noted above.  Physical Exam  Blood pressure 120/49, pulse 64, temperature 98 F (36.7 C), temperature  source Oral, resp. rate 19, height '5\' 10"'$  (1.778 m), weight 206 lb 12.8 oz (93.804  kg), SpO2 97 %.  General: Pleasant, NAD Psych: Normal affect. Neuro: Alert and oriented X 3. Moves all extremities spontaneously. HEENT: Normal  Neck: Supple without bruits or JVD. Lungs:  Resp regular and unlabored. Mild L basilar crackle, otherwise CTA Heart: RRR no s3, s4, or murmurs. Abdomen: Soft, non-tender, non-distended, BS + x 4.  Extremities: No clubbing, cyanosis or edema. DP/PT/Radials 2+ and equal bilaterally.  Labs  No results for input(s): CKTOTAL, CKMB, TROPONINI in the last 72 hours. Lab Results  Component Value Date   WBC 10.2 10/01/2015   HGB 7.6* 10/01/2015   HCT 24.2* 10/01/2015   MCV 80.4 10/01/2015   PLT 171 10/01/2015    Recent Labs Lab 09/30/15 0718 10/01/15 0421  NA 141 145  K 4.7 4.5  CL 109 114*  CO2 24 23  BUN 44* 54*  CREATININE 1.90* 1.42*  CALCIUM 8.8* 8.6*  PROT 5.3*  --   BILITOT 0.5  --   ALKPHOS 22*  --   ALT 12*  --   AST 17  --   GLUCOSE 338* 177*   Lab Results  Component Value Date   CHOL 123 05/09/2014   HDL 29* 05/09/2014   LDLCALC 62 05/09/2014   TRIG 161* 05/09/2014   Lab Results  Component Value Date   DDIMER <0.27 01/19/2014    Radiology/Studies  No results found.  ECG  NSR with LBBB and PVCs  ASSESSMENT AND PLAN  1. Likely upper GI bleed with acute on chronic anemia  - follow by GI, hold IV heparin, hold plavix and coumadin. Further endoscopy per GI service  - understandable that his thrombotic risk will be high off plavix and coumadin temporarily, however at current time, his risk of bleeding is higher than stroke. Hopefully with further GI workup and treatment, source of bleeding can be identified and we can restart plavix and coumadin as soon as able at later time.  2. h/o AS s/p St Jude AVR on coumadin with supratherapeutic INR > 4  3. CAD s/p CABG 1991 with redo 1994  4. PVD s/p bilateral LE intervention, s/p L  BKA  5. HTN 6. DM II 7. OSA 8. carotid artery stenosis 9. Chronic LBBB  Signed, Almyra Deforest, PA-C 10/01/2015, 12:17 PM As above, patient seen and examined. Briefly he is a 75 year old male with past medical history of diabetes mellitus, hypertension, hyperlipidemia, prior St. Jude aortic valve replacement, coronary artery disease status post coronary artery bypass graft, peripheral vascular disease, renal insufficiency and now with a recurrent GI bleed for evaluation of his aortic valve replacement and need for Coumadin. Patient recently noted melena and was admitted in [place]. He was transferred to [facility] for further management. Initial hemoglobin 6.3 and he is receiving transfusions. Cardiology now asked to evaluate.  With active ongoing GI bleed would hold Plavix and Coumadin. If he becomes unstable could give vitamin K and FFP to correct INR. I would like to avoid this if possible as it potentially could cause a hypercoagulable state with increased risk of valve thrombosis. GI evaluation in progress. Agree with PPI and EGD. We will follow. We will resume anticoagulation when GI bleeding is controlled. Given that he has had recurrent GI bleeds, I would favor Coumadin only and not resuming Plavix if possible. Kirk Ruths

## 2015-10-01 NOTE — Consult Note (Signed)
Referring Provider:  [facility] Primary Care Physician:  [name], MD Primary Gastroenterologist: unassigned (sees Dr. [name] in [place])  Reason for Consultation:  GI bleed     HPI: Tanner Pena is a 75 y.o. male was a rather complicated medical history. He has a history of CAD, PVD, and aortic stenosis. He had a CABG in 1991 and also underwent St. Jude's aortic valve replacement surgery for aortic valve stenosis. In 1994 he had a redo CABG. In 2005 he had a stent placed in his RCA vein graft. The patient has significant peripheral vascular disease and has had intervention to both his right iliac and bilateral SF A's. He has undergone rotational atherectomy of his SFA's. He also has mild to moderate carotid stenosis, hypertension, tobacco abuse, type 2 diabetes, and obstructive sleep apnea. He has had a left BKA. He has had several GI bleeds. He had an echo in November 2016 at [facility] that showed an ejection fraction of 40% with diffuse hypokinesis and grade 2 diastolic dysfunction. He had an admission to [facility] in November 2016 for profound anemia with hemoglobin of 5.5. He had had a colonoscopy 06/01/2015 that showed colonic diverticulosis but was otherwise unremarkable. Small bowel capsule endoscopy on 06/08/2015 showed abnormal-appearing gastric mucosa with possible erosions. Possible gastric ulcer with dried blood present. There was edematous/polypoid quality to some of the gastric mucosa and some linear erosions. Small bowel appeared to be normal. EGD 06/20/2015 showed moderate to severe gastritis. Biopsies were benign. That hospitalization was complicated gated by a post op NSTEMI. Repeat EGD 06/20/2015 by Dr. Oneida Alar at [facility] showed upper GI bleed due to moderate to severe gastritis. He has been on a B ID PPI and Carafate. He states that yesterday morning he woke up at 4:00 in the morning with the urge to have a bowel movement and had a very loose jet black bowel  movement and then several subsequent black bowel movements. He had some mild epigastric discomfort, but no nausea or vomiting. He has not had another bowel movement since transferred from [facility] to [facility]. While in the emergency room at [facility], he was found to have a hemoglobin of 6.3. Unfortunately the blood bank there did not have much blood in stock and no cardiology coverage was available and so the patient was transferred to [facility].   Past Medical History  Diagnosis Date  . Type 2 diabetes mellitus ([place]) 2007  . Essential hypertension   . Gout   . Hypercholesteremia   . Peripheral vascular disease ([place])     Lower extremity PCI/stenting  . COPD (chronic obstructive pulmonary disease) ([place])   . S/P aortic valve replacement 1990    a. St. Jude  . Chronic back pain   . Dysphagia   . Neuromuscular disorder ([place])   . Peripheral neuropathy ([place])   . Critical lower limb ischemia   . CAD (coronary artery disease)     a. 05/13/14 [name] s/p overlapping DESx2 to SVG to RCA. b.  s/p CABG '90 with redo '94 & stent to RCA SVG in 2005  . Chronic toe ulcer ([place])     a. Left foot  . Chronic systolic heart failure ([place])   . History of kidney stones   . GERD (gastroesophageal reflux disease)   . Arthritis   . Sleep apnea   . Myocardial infarction ([place])   . GI bleeding 05/31/2015    Source not identified.  . Acute blood loss  anemia 02/2015 & 05/2015    Hemoglobin 5.5 on 05/31/15; status post transfusion.    Past Surgical History  Procedure Laterality Date  . Aortic valve replacement  1990    St. Jude  . Rotator cuff repair      right  . Cataract extraction      bilateral  . Coronary stent placement  2005    RCA vein graft A 3.0x13.0 TAXUS stent was then placed int he vessel a Viva 3.0x4.0 (perfusion balloon was made ready it was placed through the entire lenght of the stent  . Peripheral vascular procedures lower extremities      Right external iliac  artery PTA and stenting  as well as bilateral SFA intervention remotely. Repeat procedures in 2011 bilaterally  . Coronary artery bypass graft  1994    6 vessels  . [name]  06/13/2011    Procedure: Venia Minks DILATION;  Surgeon: [name], MD;  Location: [facility];  Service: Endoscopy;  Laterality: N/A;  Dilated to 56.   . Angioplasty illiac artery    . Back surgery  [id number]    2  . Eye surgery    . Amputation Left 07/13/2014    Procedure: Transmetatarsal Amputation;  Surgeon: [name], MD;  Location: [place];  Service: Orthopedics;  Laterality: Left;  . Lower extremity angiogram N/A 02/15/2013    Procedure: LOWER EXTREMITY ANGIOGRAM;  Surgeon: [name], MD;  Location: [facility] CATH LAB;  Service: Cardiovascular;  Laterality: N/A;  . Left heart catheterization with coronary angiogram N/A 05/11/2014    Procedure: LEFT HEART CATHETERIZATION WITH CORONARY ANGIOGRAM;  Surgeon: [name], MD;  Location: [facility] CATH LAB;  Service: Cardiovascular;  Laterality: N/A;  . Percutaneous coronary stent intervention (pci-s) N/A 05/13/2014    Procedure: PERCUTANEOUS CORONARY STENT INTERVENTION (PCI-S);  Surgeon: [name], MD;  Location: [facility] CATH LAB;  Service: Cardiovascular;  Laterality: N/A;  . Lower extremity angiogram N/A 06/06/2014    Procedure: LOWER EXTREMITY ANGIOGRAM;  Surgeon: [name], MD;  Location: [facility] CATH LAB;  Service: Cardiovascular;  Laterality: N/A;  . Amputation Left 08/20/2014    Procedure: Revision Transmetatarsal Amputation versus Below Knee Amputation;  Surgeon: [name], MD;  Location: [place];  Service: Orthopedics;  Laterality: Left;  . Stump revision Left 09/23/2014    Procedure: Revision Left Below Knee Amputation;  Surgeon: [name], MD;  Location: [place];  Service: Orthopedics;  Laterality: Left;  . Stump revision Left 10/13/2014    Procedure: REVISION LEFT BELOW KNEE AMPUTATION STUMP;  Surgeon: [name], MD;  Location: [facility];  Service:  Orthopedics;  Laterality: Left;  . Esophagogastroduodenoscopy N/A 02/09/2015    DR. Schooler: Normal EGD  . Esophagogastroduodenoscopy  05/2011    Dr. [name]: s/p esophageal dilation, antral erosions/nodularity with benign biopsies  . Colonoscopy  05/2011    Dr. [name]: benign rectal polyp, left sided tics, ascending colonic ulcers (path c/w ischemia)  . Colonoscopy with propofol N/A 06/01/2015    RMR: normal appearing rectal mucosa. Scattered left-sided diverticula. the remainder of the colonic mucosa appeared normal. the distal 5 cm of terminal ileal mucosa also appeared normal. retroflexion was performed.   Freda Munro capsule study N/A 06/08/2015    Procedure: GIVENS CAPSULE STUDY;  Surgeon: [name], MD;  Location: [facility] ENDO SUITE;  Service: Endoscopy;  Laterality: N/A;  0700   . Esophagogastroduodenoscopy (egd) with propofol N/A 06/20/2015    Procedure: ESOPHAGOGASTRODUODENOSCOPY (EGD) WITH PROPOFOL;  Surgeon: Danie Binder, MD;  Location: [facility];  Service: Endoscopy;  Laterality: N/A;  . Biopsy N/A 06/20/2015    Procedure: BIOPSY;  Surgeon: Danie Binder, MD;  Location: [facility];  Service: Endoscopy;  Laterality: N/A;    Prior to Admission medications   Medication Sig Start Date End Date Taking? Authorizing Provider  albuterol (PROVENTIL) 4 MG tablet Take 4 mg by mouth 3 (three) times daily. 01/20/15  Yes Historical Provider, MD  albuterol-ipratropium (COMBIVENT) 18-103 MCG/ACT inhaler Inhale 1 puff into the lungs 4 (four) times daily. Coughing/ Shortness of Breath   Yes Historical Provider, MD  allopurinol (ZYLOPRIM) 300 MG tablet Take 300 mg by mouth daily.   Yes Historical Provider, MD  CARAFATE 1 GM/10ML suspension TAKE 10 ML BY MOUTH FOUR TIMES DAILY WITH MEALS AND AT BEDTIME. 08/30/15  Yes Troy Sine, MD  cholecalciferol (VITAMIN D) 1000 UNITS tablet Take 2,000 Units by mouth daily.   Yes Historical Provider, MD  fenofibrate (TRICOR) 145 MG tablet TAKE 1 TABLET BY MOUTH ONCE DAILY  FOR CHOLESTEROL. 08/18/15  Yes [name], MD  fish oil-omega-3 fatty acids 1000 MG capsule Take 1 capsule (1 g total) by mouth 2 (two) times daily. 01/22/13  Yes Kristin L Alvstad, RPH-CPP  furosemide (LASIX) 80 MG tablet Take 80 mg by mouth daily. 07/21/15  Yes Historical Provider, MD  glimepiride (AMARYL) 1 MG tablet Take 1 mg by mouth 2 (two) times daily.   Yes Historical Provider, MD  isosorbide mononitrate (IMDUR) 60 MG 24 hr tablet Take 1 tablet (60 mg total) by mouth daily. 07/27/15  Yes Troy Sine, MD  metoprolol succinate (TOPROL-XL) 25 MG 24 hr tablet Take 25 mg by mouth daily.   Yes Historical Provider, MD  NITROSTAT 0.4 MG SL tablet PLACE 1 TAB UNDER TONGUE EVERY 5 MIN IF NEEDED FOR CHEST PAIN. MAY USE 3 TIMES.NO RELIEF CALL 911. 07/20/15  Yes Eileen Stanford, PA-C  oxyCODONE-acetaminophen (PERCOCET) 10-325 MG per tablet Take 1 tablet by mouth every 4 (four) hours as needed for pain. Patient taking differently: Take 1 tablet by mouth every 3 (three) hours as needed for pain.  09/23/14  Yes [name], MD  pantoprazole (PROTONIX) 40 MG tablet Take 1 tablet (40 mg total) by mouth 2 (two) times daily before a meal. 06/23/15  Yes Estela Leonie Green, MD  potassium chloride SA (K-DUR,KLOR-CON) 20 MEQ tablet Take 20 mEq by mouth daily.     Yes Historical Provider, MD  pregabalin (LYRICA) 50 MG capsule Take one capsule by mouth twice daily for pains Patient taking differently: Take 50 mg by mouth 3 (three) times daily.  08/23/14  Yes Mahima Pandey, MD  ramipril (ALTACE) 2.5 MG capsule Take 1 capsule (2.5 mg total) by mouth daily. Patient taking differently: Take 2.5 mg by mouth at bedtime.  08/22/15  Yes [name], MD  ramipril (ALTACE) 5 MG capsule TAKE 1 CAPSULE BY MOUTH EVERY MORNING. 08/18/15  Yes [name], MD  rosuvastatin (CRESTOR) 20 MG tablet TAKE (1) TABLET BY MOUTH AT BEDTIME FOR CHOLESTEROL. 08/18/15  Yes [name], MD  sitaGLIPtin (JANUVIA) 50 MG  tablet Take 50 mg by mouth daily.   Yes Historical Provider, MD  vitamin C (ASCORBIC ACID) 500 MG tablet Take 500 mg by mouth daily.   Yes Historical Provider, MD  warfarin (COUMADIN) 5 MG tablet Take 2.5-5 mg by mouth See admin instructions. Takes 5 mg on Thursday and Sunday,  and then 2.5 mg all other days.   Yes Historical Provider, MD  clopidogrel (PLAVIX) 75 MG tablet TAKE ONE TABLET BY MOUTH DAILY WITH BREAKFAST. Patient not taking: Reported on 08/23/2015 08/18/15   [name], MD  ferrous sulfate 325 (65 FE) MG EC tablet Take 1 tablet (325 mg total) by mouth daily with breakfast. Patient not taking: Reported on 09/30/2015 06/02/15   Rexene Alberts, MD  warfarin (COUMADIN) 2.5 MG tablet Take 1 tablet (2.5 mg total) by mouth daily. Patient not taking: Reported on 09/30/2015 08/10/15   Orvan Falconer, MD    Current Facility-Administered Medications  Medication Dose Route Frequency Provider Last Rate Last Dose  . 0.9 %  sodium chloride infusion   Intravenous Once Erline Hau, MD      . 0.9 %  sodium chloride infusion   Intravenous Continuous Charlynne Cousins, MD 10 mL/hr at 10/01/15 0813    . 0.9 %  sodium chloride infusion  10 mL/hr Intravenous Once Virgel Manifold, MD      . 0.9 %  sodium chloride infusion   Intravenous Once Charlynne Cousins, MD      . acetaminophen (TYLENOL) tablet 650 mg  650 mg Oral Q6H PRN Erline Hau, MD       Or  . acetaminophen (TYLENOL) suppository 650 mg  650 mg Rectal Q6H PRN Erline Hau, MD      . furosemide (LASIX) injection 20 mg  20 mg Intravenous Once Charlynne Cousins, MD      . insulin aspart (novoLOG) injection 0-5 Units  0-5 Units Subcutaneous QHS Erline Hau, MD   2 Units at 09/30/15 2303  . insulin aspart (novoLOG) injection 0-9 Units  0-9 Units Subcutaneous TID WC Estela Leonie Green, MD   2 Units at 10/01/15 [phone]  . insulin aspart (novoLOG) injection 3 Units  3 Units Subcutaneous TID WC  Estela Leonie Green, MD   3 Units at 09/30/15 1826  . ipratropium-albuterol (DUONEB) 0.5-2.5 (3) MG/3ML nebulizer solution 3 mL  3 mL Inhalation Q4H PRN Belkys A Regalado, MD      . isosorbide mononitrate (IMDUR) 24 hr tablet 60 mg  60 mg Oral Daily Estela Leonie Green, MD   60 mg at 09/30/15 1820  . metoprolol succinate (TOPROL-XL) 24 hr tablet 25 mg  25 mg Oral Daily Erline Hau, MD   25 mg at 09/30/15 1821  . morphine 2 MG/ML injection 2 mg  2 mg Intravenous Q4H PRN Charlynne Cousins, MD      . omega-3 acid ethyl esters (LOVAZA) capsule 1 g  1 g Oral BID Erline Hau, MD   1 g at 09/30/15 2300  . ondansetron (ZOFRAN) tablet 4 mg  4 mg Oral Q6H PRN Erline Hau, MD       Or  . ondansetron [facility]) injection 4 mg  4 mg Intravenous Q6H PRN Erline Hau, MD      . pantoprazole (PROTONIX) injection 40 mg  40 mg Intravenous Q12H Erline Hau, MD   40 mg at 09/30/15 2305  . pregabalin (LYRICA) capsule 50 mg  50 mg Oral TID Erline Hau, MD   50 mg at 09/30/15 2300  . ramipril (ALTACE) capsule 2.5 mg  2.5 mg Oral Daily Erline Hau, MD   2.5 mg at 09/30/15 1820  . rosuvastatin (CRESTOR) tablet 20 mg  20  mg Oral q1800 Erline Hau, MD   20 mg at 09/30/15 1820  . senna-docusate (Senokot-S) tablet 1 tablet  1 tablet Oral QHS PRN Erline Hau, MD      . sucralfate (CARAFATE) 1 GM/10ML suspension 1 g  1 g Oral TID WC & HS Estela Leonie Green, MD   1 g at 10/01/15 0825    Allergies as of 09/30/2015  . (No Known Allergies)    Family History  Problem Relation Age of Onset  . Colon cancer Neg Hx   . Liver disease Neg Hx   . Diabetes Father   . Heart failure Other     Social History   Social History  . Marital Status: Legally Separated    Spouse Name: N/A  . Number of Children: 5  . Years of Education: N/A   Occupational History  . retired      Stage manager   Social History Main Topics  . Smoking status: Former Smoker -- 1.00 packs/day for 55 years    Types: Cigarettes    Start date: 08/19/1953    Quit date: 05/07/2014  . Smokeless tobacco: Current User    Types: Chew    Last Attempt to Quit: 08/20/1995     Comment: Has quit on 3 occasions. Counseling given today 5-10 minutes   I am more than likely going to quit "  . Alcohol Use: No     Comment: Socially, sometimes 12 ounce beer daily, may go month without/no whiskey  . Drug Use: No  . Sexual Activity: Not Currently   Other Topics Concern  . Not on file   Social History Narrative   Has 3 daughters   Has 2 sons    Review of Systems: Gen: Denies any fever, chills, sweats, anorexia, weight loss, and sleep disorder CV: Denies chest pain, angina, palpitations, syncope, orthopnea. Resp: Denies dyspnea at rest, dyspnea with exercise, cough, sputum, wheezing, coughing up blood, and pleurisy. GI: Denies vomiting blood, jaundice, and fecal incontinence.   Denies dysphagia or odynophagia. GU : Denies urinary burning, blood in urine, urinary frequency, urinary hesitancy, nocturnal urination, and urinary incontinence. MS: Denies joint pain, limitation of movement, and swelling, stiffness, low back pain, extremity pain. Denies muscle weakness, cramps, atrophy.  Derm: Denies pallor, rash, or wounds. Psych: Denies depression, anxiety, memory loss, suicidal ideation, hallucinations, paranoia, and confusion. Heme: Denies  enlarged lymph nodes. Admits to bruising easily. Neuro:  Denies any  dizziness, paresthesias.  Physical Exam: Vital signs in last 24 hours: Temp:  [97.8 F (36.6 C)-99.3 F (37.4 C)] 98.1 F (36.7 C) (02/12 0742) Pulse Rate:  [34-93] 71 (02/12 0742) Resp:  [14-26] 18 (02/12 0742) BP: (106-164)/(39-81) 122/44 mmHg (02/12 0742) SpO2:  [97 %-100 %] 100 % (02/12 0742) Weight:  [206 lb 12.8 oz (93.804 kg)] 206 lb 12.8 oz (93.804 kg) (02/11 1646) Last BM Date:  09/30/15 General:   Alert,  Well-developed, well-nourished, pleasant and cooperative in NAD Head:  Normocephalic and atraumatic. Eyes:  Sclera clear, no icterus.   Conjunctiva pink. Ears:  Normal auditory acuity. Nose:  No deformity, discharge,  or lesions. Mouth:  No deformity or lesions.   Neck:  Supple; no masses or thyromegaly. Lungs:  Clear throughout to auscultation.     Heart:  Regular rate and rhythm; 1/6 systolic murmur Abdomen:  Soft,nontender, BS active,nonpalp mass or hsm.   Rectal:  Deferred  Msk:  Symmetrical without gross deformities. . Pulses: Diminished pulses lower extremities Extremities:  Left BKA Neurologic:  Alert and  oriented x4 Skin:  Intact without significant lesions or rashes.. Psych: Alert and cooperative. Normal mood and affect.  Intake/Output from previous day: 02/11 0701 - 02/12 0700 In: 2587.5 [P.O.:360; I.V.:902.5; Blood:1325] Out: 900 [Urine:900] Intake/Output this shift: Total I/O In: -  Out: 825 [Urine:825]  Lab Results:  Recent Labs  09/30/15 0718 09/30/15 1851 10/01/15 0421  WBC 14.2* 12.9* 10.9*  HGB 6.3* 7.5* 7.3*  HCT 21.4* 24.3* 23.4*  PLT 288 227 177   BMET  Recent Labs  09/30/15 0718 10/01/15 0421  NA 141 145  K 4.7 4.5  CL 109 114*  CO2 24 23  GLUCOSE 338* 177*  BUN 44* 54*  CREATININE 1.90* 1.42*  CALCIUM 8.8* 8.6*   LFT  Recent Labs  09/30/15 0718  PROT 5.3*  ALBUMIN 2.9*  AST 17  ALT 12*  ALKPHOS 22*  BILITOT 0.5   PT/INR  Recent Labs  09/30/15 0718 10/01/15 0421  LABPROT 40.7* 32.5*  INR 4.39* 3.24*     IMPRESSION/PLAN: 75 year old male with a history of aortic valve replacement and recent non-ST elevated MI, maintained on chronic anticoagulation with Coumadin, with superficial therapeutic INR 4.39 on admission, presenting with complaints of melena. BUN 54. Likely etiology is bleeding is recurrent upper GI bleed secondary to gastritis or esophagitis or ulcer. Has received 3 units of packed  red blood cells thus far in hemoglobin is slowly improving. Coumadin currently on hold. Would continue supportive care. Trend hemoglobin. Transfuse as needed. Continue twice a day PPI. Will need EGD to assess for gastritis, ulcer, etc. We will review with attending as to timing of procedure, as will need to let INR drift closer to normal.    Hvozdovic, Vita Barley PA-C 10/01/2015,  Pager [phone]  Mon-Fri 8a-5p [phone] after 5p, weekends, holidays   Attending physician's note   I have taken a history, examined the patient and reviewed the chart. I agree with the Advanced Practitioner's note, impression and recommendations. Patient is currently hemodynamically stable, no significant change in hemoglobin after 3 units pRBC. We'll wait for INR to drift down below 2 for EGD, okay to start heparin drip if INR becomes sub therapeutic. INR 3.24 this a.m.Monitor hemoglobin and transfuse as needed. Continue PPI. If starts having active bleeding, will need to transfuse FFP and we'll consider emergent EGD in that setting.   Damaris Hippo, MD [phone] Mon-Fri 8a-5p [phone] after 5p, weekends, holidays

## 2015-10-02 DIAGNOSIS — I255 Ischemic cardiomyopathy: Secondary | ICD-10-CM

## 2015-10-02 DIAGNOSIS — R791 Abnormal coagulation profile: Secondary | ICD-10-CM

## 2015-10-02 DIAGNOSIS — E785 Hyperlipidemia, unspecified: Secondary | ICD-10-CM

## 2015-10-02 DIAGNOSIS — I2510 Atherosclerotic heart disease of native coronary artery without angina pectoris: Secondary | ICD-10-CM | POA: Diagnosis present

## 2015-10-02 LAB — TYPE AND SCREEN
ABO/RH(D): A POS
ANTIBODY SCREEN: NEGATIVE
UNIT DIVISION: 0
Unit division: 0
Unit division: 0

## 2015-10-02 LAB — GLUCOSE, CAPILLARY
GLUCOSE-CAPILLARY: 165 mg/dL — AB (ref 65–99)
GLUCOSE-CAPILLARY: 206 mg/dL — AB (ref 65–99)
Glucose-Capillary: 157 mg/dL — ABNORMAL HIGH (ref 65–99)
Glucose-Capillary: 157 mg/dL — ABNORMAL HIGH (ref 65–99)

## 2015-10-02 LAB — HEMOGLOBIN A1C
Hgb A1c MFr Bld: 6.8 % — ABNORMAL HIGH (ref 4.8–5.6)
Mean Plasma Glucose: 148 mg/dL

## 2015-10-02 LAB — PROTIME-INR
INR: 2.99 — AB (ref 0.00–1.49)
Prothrombin Time: 30.6 seconds — ABNORMAL HIGH (ref 11.6–15.2)

## 2015-10-02 MED ORDER — IPRATROPIUM-ALBUTEROL 0.5-2.5 (3) MG/3ML IN SOLN: 3.0000 mL | RESPIRATORY_TRACT | Status: DC | PRN

## 2015-10-02 MED ORDER — OXYCODONE HCL 5 MG PO TABS
5.0000 mg | ORAL_TABLET | ORAL | Status: DC | PRN
  Administered 2015-10-02 – 2015-10-04 (×6): 10 mg via ORAL
  Administered 2015-10-05: 5 mg via ORAL
  Administered 2015-10-05 – 2015-10-12 (×4): 10 mg via ORAL
  Filled 2015-10-02 (×5): qty 2
  Filled 2015-10-02: qty 1
  Filled 2015-10-02 (×6): qty 2

## 2015-10-02 MED ORDER — ACETAMINOPHEN 325 MG PO TABS
650.0000 mg | ORAL_TABLET | Freq: Four times a day (QID) | ORAL | Status: DC | PRN
  Administered 2015-10-03: 650 mg via ORAL
  Filled 2015-10-02: qty 2

## 2015-10-02 MED ORDER — MORPHINE SULFATE (PF) 2 MG/ML IV SOLN
2.0000 mg | INTRAVENOUS | Status: DC | PRN
  Administered 2015-10-02 – 2015-10-07 (×3): 2 mg via INTRAVENOUS
  Filled 2015-10-02 (×3): qty 1

## 2015-10-02 MED ORDER — ACETAMINOPHEN 650 MG RE SUPP: 650.0000 mg | Freq: Four times a day (QID) | RECTAL | Status: DC | PRN

## 2015-10-02 NOTE — Progress Notes (Signed)
          Daily Rounding Note  10/02/2015, 8:14 AM  LOS: 2 days   SUBJECTIVE:       Last BM yest afternoon was still black.  Never has had n/v.  No abd pain.  Just phantom limb pain.    OBJECTIVE:         Vital signs in last 24 hours:    Temp:  [97.5 F (36.4 C)-98.2 F (36.8 C)] 97.8 F (36.6 C) (02/13 0500) Pulse Rate:  [38-73] 55 (02/13 0500) Resp:  [15-21] 17 (02/13 0500) BP: (111-140)/(43-71) 121/43 mmHg (02/13 0500) SpO2:  [96 %-100 %] 98 % (02/13 0500) Last BM Date: 10/01/15 Filed Weights   09/30/15 0701 09/30/15 1646  Weight: 97.977 kg (216 lb) 93.804 kg (206 lb 12.8 oz)   General: pleasant, alert, looks chronicaally ill.     Heart: irreg, irreg with faint valve smap Chest: clear bil, though very diminished sounds.  Loose/sputumy cough.  No resting dyspnea Abdomen: soft, obese, active BS, NT.  Extremities: left BKA Neuro/Psych:  Oriented x 3.  No gross deficits.   Intake/Output from previous day: 02/12 0701 - 02/13 0700 In: 1290 [I.V.:285; Blood:1005] Out: 2425 [Urine:2425]  Intake/Output this shift:    Lab Results:  Recent Labs  10/01/15 0421 10/01/15 0905 10/01/15 2229  WBC 10.9* 10.2 11.2*  HGB 7.3* 7.6* 9.2*  HCT 23.4* 24.2* 29.8*  PLT 177 171 180   BMET  Recent Labs  09/30/15 0718 10/01/15 0421  NA 141 145  K 4.7 4.5  CL 109 114*  CO2 24 23  GLUCOSE 338* 177*  BUN 44* 54*  CREATININE 1.90* 1.42*  CALCIUM 8.8* 8.6*   LFT  Recent Labs  09/30/15 0718  PROT 5.3*  ALBUMIN 2.9*  AST 17  ALT 12*  ALKPHOS 22*  BILITOT 0.5   PT/INR  Recent Labs  10/01/15 0421 10/02/15 0307  LABPROT 32.5* 30.6*  INR 3.24* 2.99*   Hepatitis Panel No results for input(s): HEPBSAG, HCVAB, HEPAIGM, HEPBIGM in the last 72 hours.  Studies/Results: No results found.  ASSESMENT:   *  Melena.  06/20/2015 EGD, Dr Oneida Alar:  Moderate to severe gastritis.  Bradycardia and then non STEMI during/post  exam.  Path: focal erosion, mild chronic active gastritis, no H Pylori. Meds listed PTA: Carafate, BID Protonix, po iron.  05/2015 Capsule endo. Abnormal gastric mucosa, possible erosions. ? Gastric ulcer with dried blood.  05/2015 Colonoscopy, Dr Sydell Axon, for IDA,hx colonic ulcers.  Scattered left tics.   01/2015 EGD for IDA, Dr Michail Sermon 02/2015 CT angio abd/pelvis: distal ileal thickening, improvement in previous inflammatory changes.   *  Acute on chronic anemia.  S/p PRBC x 3.  On po Iron PTA.   *  Chronic Coumadin for hx St Jude AVR.  2005 cardiac stent to vein graft.  S/p iliac and SFA interventions, carotid stenosis.  S/p left BKA .  Chronic Plavix  *  EF 93%, grade 2 diastolic dysfunction.   *  Recent non STEMI.     PLAN   *  Allow INR to drift down to ~ 1.8 an then perform EGD vs enteroscopy.  Will obtain slot for tomorrow but check INR in AM.  Allow heart/diabetic diet today.  NPO after midnight in case EGD happens tomorrow.       Azucena Freed  10/02/2015, 8:14 AM Pager: [phone]

## 2015-10-02 NOTE — Progress Notes (Signed)
Patient Name: Tanner Pena Date of Encounter: 10/02/2015  Hospital Problem List     Principal Problem:   UGIB (upper gastrointestinal bleed) Active Problems:   Long term current use of anticoagulant therapy   H/O aortic valve replacement- St Jude   DM2 (diabetes mellitus, type 2) (HCC)   Hyperlipidemia with target LDL less than 70   Cardiomyopathy, ischemic - EF 25-30% by echo 5/14   Acute blood loss anemia   GI bleed   AKI (acute kidney injury) ([place])    Subjective   NO c/o CP or Dyspnea. NO edema. + Melena yesterday. No change in his "usual heart skipping beats"   Inpatient Medications    . sodium chloride   Intravenous Once  . sodium chloride  10 mL/hr Intravenous Once  . insulin aspart  0-5 Units Subcutaneous QHS  . insulin aspart  0-9 Units Subcutaneous TID WC  . insulin aspart  3 Units Subcutaneous TID WC  . isosorbide mononitrate  60 mg Oral Daily  . metoprolol succinate  25 mg Oral Daily  . omega-3 acid ethyl esters  1 g Oral BID  . pantoprazole (PROTONIX) IV  40 mg Intravenous Q12H  . pregabalin  50 mg Oral TID  . ramipril  2.5 mg Oral Daily  . rosuvastatin  20 mg Oral q1800  . sucralfate  1 g Oral TID WC & HS    Vital Signs    Filed Vitals:   10/01/15 2048 10/01/15 2302 10/02/15 0500 10/02/15 0835  BP: 113/49 118/50 121/43 129/54  Pulse: 54 57 55 58  Temp: 98.2 F (36.8 C) 97.5 F (36.4 C) 97.8 F (36.6 C) 97.5 F (36.4 C)  TempSrc: Oral Oral Oral Oral  Resp: '15 15 17 18  '$ Height:      Weight:      SpO2: 97% 97% 98% 99%    Intake/Output Summary (Last 24 hours) at 10/02/15 0941 Last data filed at 10/02/15 0700  Gross per 24 hour  Intake   1205 ml  Output   2300 ml  Net  -1095 ml   Filed Weights   09/30/15 0701 09/30/15 1646  Weight: 216 lb (97.977 kg) 206 lb 12.8 oz (93.804 kg)    Physical Exam    General: Pleasant, NAD. Neuro: Alert and oriented X 3. Moves all extremities spontaneously. Psych: Normal affect. HEENT:   Normal  Neck: Supple without bruits or JVD. Lungs:  Resp regular and unlabored, CTA except mild L basilar crackles. Heart: RRR with ectopy, crisp S1, no s3, s4, or murmurs. Abdomen: Soft, non-tender, non-distended, BS + x 4.  Extremities: No clubbing, cyanosis or edema. DP/PT/Radials 2+ and equal bilaterally.  Labs    CBC  Recent Labs  09/30/15 0718  10/01/15 0905 10/01/15 2229  WBC 14.2*  < > 10.2 11.2*  NEUTROABS 11.5*  --   --   --   HGB 6.3*  < > 7.6* 9.2*  HCT 21.4*  < > 24.2* 29.8*  MCV 80.8  < > 80.4 81.9  PLT 288  < > 171 180  < > = values in this interval not displayed. Basic Metabolic Panel  Recent Labs  09/30/15 0718 10/01/15 0421  NA 141 145  K 4.7 4.5  CL 109 114*  CO2 24 23  GLUCOSE 338* 177*  BUN 44* 54*  CREATININE 1.90* 1.42*  CALCIUM 8.8* 8.6*   Liver Function Tests  Recent Labs  09/30/15 0718  AST 17  ALT 12*  ALKPHOS 22*  BILITOT 0.5  PROT 5.3*  ALBUMIN 2.9*   Lab Results  Component Value Date   INR 2.99* 10/02/2015   INR 3.24* 10/01/2015   INR 4.39* 09/30/2015    No results for input(s): LIPASE, AMYLASE in the last 72 hours. Cardiac Enzymes No results for input(s): CKTOTAL, CKMB, CKMBINDEX, TROPONINI in the last 72 hours. BNP Invalid input(s): POCBNP D-Dimer No results for input(s): DDIMER in the last 72 hours. Hemoglobin A1C No results for input(s): HGBA1C in the last 72 hours. Fasting Lipid Panel No results for input(s): CHOL, HDL, LDLCALC, TRIG, CHOLHDL, LDLDIRECT in the last 72 hours. Thyroid Function Tests No results for input(s): TSH, T4TOTAL, T3FREE, THYROIDAB in the last 72 hours.  Invalid input(s): FREET3  Telemetry    SR with LBBB & frequent PVCs (Bigeminy & Trigeminy pattern)  ECG    n/a  Radiology    No results found.  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    Principal Problem:   UGIB (upper gastrointestinal bleed) Active Problems:    H/O aortic valve replacement- St Jude --> Long term current use of anticoagulant therapy for Mechanical AVR     DM2 (diabetes mellitus, type 2) ([place])  Per [facility].   Hyperlipidemia with target LDL less than 70 - on Statin   Cardiomyopathy, ischemic - EF 25-30% by echo 5/14; No active CHF Sx on BB (Toprol) & ACE-I. Not on Lasix.   Acute blood loss anemia   AKI (acute kidney injury) ([place]) - no recent check, but Cr level was trending down.   1. Likely upper GI bleed with acute on chronic anemia  Followed by GI, hold IV heparin, hold plavix and coumadin. Further endoscopy per GI service - not today due to INR still >2  understandable that his thrombotic risk will be high off plavix and coumadin temporarily, however at current time, his risk of bleeding is higher than stroke.   Hopefully with further GI workup and treatment, source of bleeding can be identified and we can restartcoumadin as soon as able at later time.  Agree with Dr. Stanford Breed - would prefer not to restart Plavix for PAD/CAD concern given frequent GIB.  2. h/o AS s/p St Jude AVR on coumadin with supratherapeutic INR > 4 (would hope to avoid Vit K to avoid transient hypercoagulable state with mechanical valve)  3. CAD s/p CABG 1991 with redo 1994 - no recurrent angina  On BB, ACE-I & Crestor along with Imdur - stable BP; No change  No absolute requirement for Plavix  4. PVD s/p bilateral LE intervention, s/p L BKA  No absolute requirement for Plavix  On CAD meds.   Signed, Leonie Man, M.D., M.S. Interventional Cardiologist   Pager # [phone] Phone # [phone] [address] [place], [place] [address]

## 2015-10-02 NOTE — Care Management Important Message (Signed)
Important Message  Patient Details  Name: Tanner Pena MRN: [id number] Date of Birth: 07/12/1941   Medicare Important Message Given:  Yes    [name] P [place] 10/02/2015, 1:59 PM

## 2015-10-02 NOTE — Progress Notes (Signed)
[place] TEAM 1 - Stepdown/ICU TEAM PROGRESS NOTE  Tanner Pena VQM:[id number] DOB: 10-21-1940 DOA: 09/30/2015 PCP: [name], MD  Admit HPI / Brief Narrative: 75 y.o. male w/ a hx of multiple prior episodes of melena and non-ST elevated MI, complicated by an aortic mechanical valve that requires anticoagulation with Coumadin. 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  He presented to the ED at [facility] after his melena returned, and was transferred to [facility] because of a reported shortage of blood products at [facility].  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  He denies having a bowel movement today.  He denies chest pain shortness breath fevers chills nausea or vomiting.  Assessment/Plan:  Melena / ?UGIB Protonix twice a day and Carafate - all anticoagulants on hold at present - for endoscopy in AM   Acute blood loss anemia on chronic anemia  S/p 5U PRBC thus far - keep Hgb 8.0 or > - appears to be stabilizing   H/O aortic valve replacement (St Jude) Coumadin being held to allow INR to drift down - Cards following   CAD s/p CABG 1991 with redo 1994 no recurrent angina  DM2  A1c 6.8 - CBG reasonably controlled - follow w/o change today   Hyperlipidemia with target LDL less than 70 Continue statins  Cardiomyopathy, ischemic - EF 25-30% by echo 5/14 Well compensated presently   PVD s/p bilateral LE intervention, s/p L BKA  Acute kidney injury prerenal in the setting of acute GI bleed - improved with transfusions - follow   Code Status: FULL Family Communication: no family present at time of exam Disposition Plan: SDU  Consultants: [facility]  [facility]  Procedures: None   Antibiotics: none  DVT prophylaxis: warfarin  Objective: Blood pressure 125/60, pulse 57, temperature 97.8 F  (36.6 C), temperature source Oral, resp. rate 22, height '5\' 10"'$  (1.778 m), weight 93.804 kg (206 lb 12.8 oz), SpO2 100 %.  Intake/Output Summary (Last 24 hours) at 10/02/15 1353 Last data filed at 10/02/15 1100  Gross per 24 hour  Intake   1165 ml  Output   2000 ml  Net   -835 ml   Exam: General: No acute respiratory distress Lungs: Clear to auscultation bilaterally without wheezes or crackles Cardiovascular: Regular rate and rhythm without murmur gallop or rub - metallic systolic click  Abdomen: Nontender, nondistended, soft, bowel sounds positive, no rebound, no ascites, no appreciable mass Extremities: No significant cyanosis, clubbing, or edema bilateral lower extremities - L BKA stump well healed and unremarkable on exam   Data Reviewed:  Basic Metabolic Panel:  Recent Labs Lab 09/30/15 0718 10/01/15 0421  NA 141 145  K 4.7 4.5  CL 109 114*  CO2 24 23  GLUCOSE 338* 177*  BUN 44* 54*  CREATININE 1.90* 1.42*  CALCIUM 8.8* 8.6*    CBC:  Recent Labs Lab 09/30/15 0718 09/30/15 1851 10/01/15 0421 10/01/15 0905 10/01/15 2229  WBC 14.2* 12.9* 10.9* 10.2 11.2*  NEUTROABS 11.5*  --   --   --   --   HGB 6.3* 7.5* 7.3* 7.6* 9.2*  HCT 21.4* 24.3* 23.4* 24.2* 29.8*  MCV 80.8 79.7 81.0 80.4 81.9  PLT 288 227 177 171 180    Liver Function Tests:  Recent Labs Lab 09/30/15 0718  AST 17  ALT 12*  ALKPHOS 22*  BILITOT 0.5  PROT 5.3*  ALBUMIN 2.9*    Coags:  Recent Labs Lab 09/27/15 09/27/15 1345 09/30/15 0718 10/01/15 0421 10/02/15 0307  INR 3.7* 3.7* 4.39* 3.24* 2.99*   CBG:  Recent Labs Lab 10/01/15 1155 10/01/15 1707 10/01/15 2206 10/02/15 0833 10/02/15 1249  GLUCAP 202* 165* 151* 165* 206*    Recent Results (from the past 240 hour(s))  MRSA PCR Screening     Status: None   Collection Time: 09/30/15  5:14 PM  Result Value Ref Range Status   MRSA by PCR NEGATIVE NEGATIVE Final    Comment:        The GeneXpert MRSA Assay (FDA approved  for NASAL specimens only), is one component of a comprehensive MRSA colonization surveillance program. It is not intended to diagnose MRSA infection nor to guide or monitor treatment for MRSA infections.     Studies:   Recent x-ray studies have been reviewed in detail by the Attending Physician  Scheduled Meds:  Scheduled Meds: . sodium chloride   Intravenous Once  . sodium chloride  10 mL/hr Intravenous Once  . insulin aspart  0-5 Units Subcutaneous QHS  . insulin aspart  0-9 Units Subcutaneous TID WC  . insulin aspart  3 Units Subcutaneous TID WC  . isosorbide mononitrate  60 mg Oral Daily  . metoprolol succinate  25 mg Oral Daily  . omega-3 acid ethyl esters  1 g Oral BID  . pantoprazole (PROTONIX) IV  40 mg Intravenous Q12H  . pregabalin  50 mg Oral TID  . ramipril  2.5 mg Oral Daily  . rosuvastatin  20 mg Oral q1800  . sucralfate  1 g Oral TID WC & HS    Time spent on care of this patient: 35 mins   Aadi Bordner T , MD   [facility] Office  [phone] Pager - Text Page per Shea Evans as per below:  On-Call/Text Page:      [email or web address]      password [email or web address]  If 7PM-7AM, please contact night-coverage [email or web address] Password [email or web address] 10/02/2015, 1:53 PM   LOS: 2 days

## 2015-10-03 DIAGNOSIS — I2510 Atherosclerotic heart disease of native coronary artery without angina pectoris: Secondary | ICD-10-CM

## 2015-10-03 LAB — CBC
HCT: 32.2 % — ABNORMAL LOW (ref 39.0–52.0)
Hemoglobin: 10.1 g/dL — ABNORMAL LOW (ref 13.0–17.0)
MCH: 26.4 pg (ref 26.0–34.0)
MCHC: 31.4 g/dL (ref 30.0–36.0)
MCV: 84.1 fL (ref 78.0–100.0)
PLATELETS: 153 10*3/uL (ref 150–400)
RBC: 3.83 MIL/uL — AB (ref 4.22–5.81)
RDW: 18.2 % — AB (ref 11.5–15.5)
WBC: 7 10*3/uL (ref 4.0–10.5)

## 2015-10-03 LAB — GLUCOSE, CAPILLARY
GLUCOSE-CAPILLARY: 148 mg/dL — AB (ref 65–99)
GLUCOSE-CAPILLARY: 167 mg/dL — AB (ref 65–99)
GLUCOSE-CAPILLARY: 185 mg/dL — AB (ref 65–99)
Glucose-Capillary: 184 mg/dL — ABNORMAL HIGH (ref 65–99)

## 2015-10-03 LAB — COMPREHENSIVE METABOLIC PANEL
ALT: 10 U/L — AB (ref 17–63)
AST: 23 U/L (ref 15–41)
Albumin: 3 g/dL — ABNORMAL LOW (ref 3.5–5.0)
Alkaline Phosphatase: 27 U/L — ABNORMAL LOW (ref 38–126)
Anion gap: 10 (ref 5–15)
BUN: 27 mg/dL — ABNORMAL HIGH (ref 6–20)
CHLORIDE: 108 mmol/L (ref 101–111)
CO2: 23 mmol/L (ref 22–32)
Calcium: 9.2 mg/dL (ref 8.9–10.3)
Creatinine, Ser: 1.06 mg/dL (ref 0.61–1.24)
Glucose, Bld: 160 mg/dL — ABNORMAL HIGH (ref 65–99)
POTASSIUM: 4.1 mmol/L (ref 3.5–5.1)
SODIUM: 141 mmol/L (ref 135–145)
Total Bilirubin: 0.4 mg/dL (ref 0.3–1.2)
Total Protein: 5.7 g/dL — ABNORMAL LOW (ref 6.5–8.1)

## 2015-10-03 LAB — PROTIME-INR
INR: 2.37 — ABNORMAL HIGH (ref 0.00–1.49)
PROTHROMBIN TIME: 25.7 s — AB (ref 11.6–15.2)

## 2015-10-03 MED ORDER — PANTOPRAZOLE SODIUM 40 MG PO TBEC
40.0000 mg | DELAYED_RELEASE_TABLET | Freq: Two times a day (BID) | ORAL | Status: DC
  Administered 2015-10-03 – 2015-10-04 (×2): 40 mg via ORAL
  Filled 2015-10-03 (×2): qty 1

## 2015-10-03 NOTE — Progress Notes (Signed)
Dr. Ellyn Hack made aware of frequent PVC's while on rounds.

## 2015-10-03 NOTE — Progress Notes (Signed)
[place] TEAM 1 - Stepdown/ICU TEAM Progress Note  Tanner Pena:[id number] DOB: 1940-12-11 DOA: 09/30/2015 PCP: [name], MD  Admit HPI / Brief Narrative: 75 y.o. 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 Jude AV replacement (on Coumadin), chronic systolic CHF, NSTEMI, HLD, chronic pain syndrome (back/neuropathy), OSA, acute blood loss anemia, Multiple episodes Melena, Erosive Gastritis  On prior endoscopic studies he was found to have erosive gastritis and has been maintained on twice daily PPI and Carafate. He presented to the ED at [facility] after his melena returned, and was transferred to [facility] because of a reported shortage of blood products at [facility].  HPI/Subjective: 2/14 A/O 4, NAD. States continues to have melena.  Assessment/Plan:  Melena / ?UGIB Protonix twice a day and Carafate - all anticoagulants on hold at present - Endoscopy postponed secondary to patient's INR being > 2.; Patient's INR continues to drift down and should be <2 by the a.m.  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  Dr. Nelida Meuse III [facility]  Procedure/Significant Events:    Culture NA  Antibiotics: NA  DVT prophylaxis: SCD  Devices NA   LINES / TUBES:  NA    Continuous Infusions:   Objective: VITAL SIGNS: Temp: 98 F (36.7 C) (02/14 0817) Temp Source: Oral (02/14 0817) BP: 159/64 mmHg (02/14 1035) Pulse Rate: 66 (02/14 1035) SPO2; FIO2:   Intake/Output Summary (Last 24 hours) at 10/03/15 1109 Last data filed at 10/03/15 0800  Gross per 24 hour  Intake    720 ml  Output   1050 ml  Net   -330 ml     Exam: General:  A/O 4, NAD, No acute respiratory distress Eyes: Negative headache, negative scleral hemorrhage ENT: Negative Runny nose, negative gingival bleeding, Neck:  Negative scars, masses, torticollis, lymphadenopathy, JVD Lungs: Clear to auscultation bilaterally without wheezes or crackles Cardiovascular: Regular rate and rhythm without murmur gallop or rub loud  S1 and S2 Abdomen:negative abdominal pain, nondistended, positive soft, bowel sounds, no rebound, no ascites, no appreciable mass Extremities: No significant cyanosis, clubbing, or edema bilateral lower extremities Psychiatric:  Negative depression, negative anxiety, negative fatigue, negative mania Neurologic:  Cranial nerves II through XII intact, tongue/uvula midline, all extremities muscle strength 5/5, sensation intact throughout, finger nose finger bilateral within normal limits, quick finger touch bilateral within normal limits, negative dysarthria, negative expressive aphasia, negative receptive aphasia.   Data Reviewed: Basic Metabolic Panel:  Recent Labs Lab 09/30/15 0718 10/01/15 0421 10/03/15 0212  NA 141  145 141  K 4.7 4.5 4.1  CL 109 114* 108  CO2 '24 23 23  '$ GLUCOSE 338* 177* 160*  BUN 44* 54* 27*  CREATININE 1.90* 1.42* 1.06  CALCIUM 8.8* 8.6* 9.2   Liver Function Tests:  Recent Labs Lab 09/30/15 0718  10/03/15 0212  AST 17 23  ALT 12* 10*  ALKPHOS 22* 27*  BILITOT 0.5 0.4  PROT 5.3* 5.7*  ALBUMIN 2.9* 3.0*   No results for input(s): LIPASE, AMYLASE in the last 168 hours. No results for input(s): AMMONIA in the last 168 hours. CBC:  Recent Labs Lab 09/30/15 0718 09/30/15 1851 10/01/15 0421 10/01/15 0905 10/01/15 2229 10/03/15 0212  WBC 14.2* 12.9* 10.9* 10.2 11.2* 7.0  NEUTROABS 11.5*  --   --   --   --   --   HGB 6.3* 7.5* 7.3* 7.6* 9.2* 10.1*  HCT 21.4* 24.3* 23.4* 24.2* 29.8* 32.2*  MCV 80.8 79.7 81.0 80.4 81.9 84.1  PLT 288 227 177 171 180 153   Cardiac Enzymes: No results for input(s): CKTOTAL, CKMB, CKMBINDEX, TROPONINI in the last 168 hours. BNP (last 3 results)  Recent Labs  12/28/14 0134 02/04/15 0413 07/28/15 1944  BNP 504.3* 593.2* 156.0*    ProBNP (last 3 results) No results for input(s): PROBNP in the last 8760 hours.  CBG:  Recent Labs Lab 10/02/15 0833 10/02/15 1249 10/02/15 1756 10/02/15 2126 10/03/15 0810  GLUCAP 165* 206* 157* 157* 148*    Recent Results (from the past 240 hour(s))  MRSA PCR Screening     Status: None   Collection Time: 09/30/15  5:14 PM  Result Value Ref Range Status   MRSA by PCR NEGATIVE NEGATIVE Final    Comment:        The GeneXpert MRSA Assay (FDA approved for NASAL specimens only), is one component of a comprehensive MRSA colonization surveillance program. It is not intended to diagnose MRSA infection nor to guide or monitor treatment for MRSA infections.      Studies:  Recent x-ray studies have been reviewed in detail by the Attending Physician  Scheduled Meds:  Scheduled Meds: . insulin aspart  0-5 Units Subcutaneous QHS  . insulin aspart  0-9 Units Subcutaneous TID WC  . insulin aspart  3 Units Subcutaneous TID WC  . isosorbide mononitrate  60 mg Oral Daily  . metoprolol succinate  25 mg Oral Daily  . omega-3 acid ethyl esters  1 g Oral BID  . pantoprazole (PROTONIX) IV  40 mg  Intravenous Q12H  . pregabalin  50 mg Oral TID  . rosuvastatin  20 mg Oral q1800  . sucralfate  1 g Oral TID WC & HS    Time spent on care of this patient: 40 mins   Ruhan Borak, Geraldo Docker , MD  [facility] Office  [phone] Pager - [phone]  On-Call/Text Page:      [email or web address]      password [email or web address]  If 7PM-7AM, please contact night-coverage [email or web address] Password [email or web address] 10/03/2015, 11:09 AM   LOS: 3 days   Care during the described time interval was provided by me .  I have reviewed this patient's available data, including medical history, events of note, physical examination, and all test results as part of my evaluation. I have personally reviewed and interpreted all radiology studies.   Dia Crawford, MD [phone] Pager

## 2015-10-03 NOTE — Progress Notes (Signed)
Patient Name: Tanner Pena Date of Encounter: 10/03/2015  Hospital Problem List     Principal Problem:   UGIB (upper gastrointestinal bleed) Active Problems:   Long term current use of anticoagulant therapy   H/O aortic valve replacement- St Jude   DM2 (diabetes mellitus, type 2) (HCC)   Hyperlipidemia with target LDL less than 70   Cardiomyopathy, ischemic - EF 25-30% by echo 5/14   Acute blood loss anemia   GI bleed   AKI (acute kidney injury) ([place])   Coronary artery disease involving native coronary artery of native heart without angina pectoris    Subjective   Feels sad after hearing that the Endoscopy is postponed  NO c/o CP or Dyspnea. NO edema. Still notes dark stools - no frank blood No change in his "usual heart skipping beats"  Notes jolting sensation in stump  Inpatient Medications    . insulin aspart  0-5 Units Subcutaneous QHS  . insulin aspart  0-9 Units Subcutaneous TID WC  . insulin aspart  3 Units Subcutaneous TID WC  . isosorbide mononitrate  60 mg Oral Daily  . metoprolol succinate  25 mg Oral Daily  . omega-3 acid ethyl esters  1 g Oral BID  . pantoprazole (PROTONIX) IV  40 mg Intravenous Q12H  . pregabalin  50 mg Oral TID  . rosuvastatin  20 mg Oral q1800  . sucralfate  1 g Oral TID WC & HS    Vital Signs    Filed Vitals:   10/03/15 0026 10/03/15 0426 10/03/15 0427 10/03/15 0817  BP: 140/58 153/61  149/98  Pulse: 56 50 51 37  Temp: 97.5 F (36.4 C) 97.3 F (36.3 C)  98 F (36.7 C)  TempSrc: Oral Oral  Oral  Resp: '16 16 18 17  '$ Height:      Weight:      SpO2: 98% 99% 99% 98%    Intake/Output Summary (Last 24 hours) at 10/03/15 0915 Last data filed at 10/03/15 0800  Gross per 24 hour  Intake    960 ml  Output   1400 ml  Net   -440 ml   Filed Weights   09/30/15 0701 09/30/15 1646  Weight: 216 lb (97.977 kg) 206 lb 12.8 oz (93.804 kg)    Physical Exam    General: Pleasant, NAD. Neuro: Alert and oriented X 3. Moves all  extremities spontaneously. Psych: Normal affect. HEENT:  Normal  Neck: Supple without bruits or JVD. Lungs:  Resp regular and unlabored, CTA except mild L basilar crackles. Heart: RRR with ectopy, crisp S1; no s3, s4, or murmurs. Abdomen: Soft, non-tender, non-distended, BS + x 4.  Extremities: No clubbing, cyanosis or edema. DP/PT/Radials 2+ and equal bilaterally.  Labs    CBC  Recent Labs  10/01/15 2229 10/03/15 0212  WBC 11.2* 7.0  HGB 9.2* 10.1*  HCT 29.8* 32.2*  MCV 81.9 84.1  PLT 180 916   Basic Metabolic Panel  Recent Labs  10/01/15 0421 10/03/15 0212  NA 145 141  K 4.5 4.1  CL 114* 108  CO2 23 23  GLUCOSE 177* 160*  BUN 54* 27*  CREATININE 1.42* 1.06  CALCIUM 8.6* 9.2   Liver Function Tests  Recent Labs  10/03/15 0212  AST 23  ALT 10*  ALKPHOS 27*  BILITOT 0.4  PROT 5.7*  ALBUMIN 3.0*   Lab Results  Component Value Date   INR 2.37* 10/03/2015   INR 2.99* 10/02/2015   INR 3.24* 10/01/2015  No results for input(s): LIPASE, AMYLASE in the last 72 hours. Cardiac Enzymes No results for input(s): CKTOTAL, CKMB, CKMBINDEX, TROPONINI in the last 72 hours. BNP Invalid input(s): POCBNP D-Dimer No results for input(s): DDIMER in the last 72 hours. Hemoglobin A1C  Recent Labs  09/30/15 1856  HGBA1C 6.8*   Fasting Lipid Panel No results for input(s): CHOL, HDL, LDLCALC, TRIG, CHOLHDL, LDLDIRECT in the last 72 hours. Thyroid Function Tests No results for input(s): TSH, T4TOTAL, T3FREE, THYROIDAB in the last 72 hours.  Invalid input(s): FREET3  Telemetry    SR with LBBB & frequent PVCs (Bigeminy & Trigeminy pattern) - INCORRECTLY RECORDED RATES IN 30-40s. Stable rate.  ECG    n/a  Radiology    No results found.  Assessment & Plan   The patient is a 75 year old male with past medical history of HTN, DM II, OSA, carotid artery stenosis, CAD s/p CABG 1991 with redo 1994, PVD s/p bilateral LE intervention, s/p L BKA and h/o AS s/p St  Jude AVR presented with supratherapeutic INR and GI bleed.     Principal Problem:   UGIB (upper gastrointestinal bleed) -- supra therapeutic INR > 4; s/p 5 units PRBC with stable Hgb.  Endo on hold as INR still >2  (would hope to avoid Vit K to avoid transient hypercoagulable state with mechanical valve)  Active Problems:    H/O aortic valve replacement- St Jude --> Long term current use of anticoagulant therapy for Mechanical AVR  Anticoagulation on hold for supra therapeutic INR - plan for Endoscopy pending drop in INR (still >2)   Will need to restart Warfarin (with Heparin bridging) once source of bleeding identified/treated -- would hope to minimize time with INR <2-2.5 with mechanical AoV.     DM2 (diabetes mellitus, type 2) (Tanner Pena)  Per [facility].   Hyperlipidemia with target LDL less than 70 - on Statin   Cardiomyopathy, ischemic - EF 25-30% by echo 5/14; No active CHF Sx on BB (Toprol) & ACE-I. Not on Lasix.   Acute blood loss anemia   AKI (acute kidney injury) (Tanner Pena) - no recent check, but Cr level was trending down.   CAD s/p CABG 1991 with redo 1994 - no recurrent angina,   On BB, ACE-I & Crestor along with Imdur - stable BP; No change  Agree with Dr. Stanford Breed - would prefer not to restart Plavix for PAD/CAD concern given frequent GIB.  PVD s/p bilateral LE intervention, s/p L BKA  No absolute requirement for Plavix  On CAD meds.  Frequent PVCs /Trigeminy - with pauses & bradycardia, cannot further titrate BB dosing.  No prolonged arrhythmia.   REMAINS STABLE FROM A CARDIAC PERSPECTIVE. Will follow peripherally for now until Endo performed & restarting anticoagulation.   Signed, Leonie Man, M.D., M.S. Interventional Cardiologist   Pager # [phone] Phone # [phone] [address] [place],  [address]

## 2015-10-03 NOTE — Progress Notes (Signed)
Daily Rounding Note  10/03/2015, 2:09 PM  LOS: 3 days   SUBJECTIVE:       Stools dark, not bloody, yesterday.  No BM today.  No dyspnea or cough. No dizziness.  Feels well. Eating solids  OBJECTIVE:         Vital signs in last 24 hours:    Temp:  [97.3 F (36.3 C)-98.1 F (36.7 C)] 97.6 F (36.4 C) (02/14 1246) Pulse Rate:  [37-66] 55 (02/14 1246) Resp:  [15-19] 15 (02/14 1246) BP: (134-159)/(50-98) 140/61 mmHg (02/14 1246) SpO2:  [92 %-100 %] 100 % (02/14 1246) Last BM Date: 10/02/15 Filed Weights   09/30/15 0701 09/30/15 1646  Weight: 97.977 kg (216 lb) 93.804 kg (206 lb 12.8 oz)   General: pleasant, NAD.  Looks relatively well.    Heart: irreg, irreg Chest: clear but BS on right diminished.  No cough or dyspnea Abdomen: soft, NT, ND.  BS active.   Extremities: no CCE Neuro/Psych:  Pleasant, calm.  Alert, oriented x 3.  No gross deficits.   Intake/Output from previous day: 02/13 0701 - 02/14 0700 In: 960 [P.O.:960] Out: 1050 [Urine:1050]  Intake/Output this shift: Total I/O In: 360 [P.O.:360] Out: 350 [Urine:350]  Lab Results:  Recent Labs  10/01/15 0905 10/01/15 2229 10/03/15 0212  WBC 10.2 11.2* 7.0  HGB 7.6* 9.2* 10.1*  HCT 24.2* 29.8* 32.2*  PLT 171 180 153   BMET  Recent Labs  10/01/15 0421 10/03/15 0212  NA 145 141  K 4.5 4.1  CL 114* 108  CO2 23 23  GLUCOSE 177* 160*  BUN 54* 27*  CREATININE 1.42* 1.06  CALCIUM 8.6* 9.2   LFT  Recent Labs  10/03/15 0212  PROT 5.7*  ALBUMIN 3.0*  AST 23  ALT 10*  ALKPHOS 27*  BILITOT 0.4   PT/INR  Recent Labs  10/02/15 0307 10/03/15 0212  LABPROT 30.6* 25.7*  INR 2.99* 2.37*   Hepatitis Panel No results for input(s): HEPBSAG, HCVAB, HEPAIGM, HEPBIGM in the last 72 hours.  Studies/Results: No results found.   Scheduled Meds: . insulin aspart  0-5 Units Subcutaneous QHS  . insulin aspart  0-9 Units Subcutaneous TID WC  .  insulin aspart  3 Units Subcutaneous TID WC  . isosorbide mononitrate  60 mg Oral Daily  . metoprolol succinate  25 mg Oral Daily  . omega-3 acid ethyl esters  1 g Oral BID  . pantoprazole (PROTONIX) IV  40 mg Intravenous Q12H  . pregabalin  50 mg Oral TID  . rosuvastatin  20 mg Oral q1800  . sucralfate  1 g Oral TID WC & HS   Continuous Infusions:  PRN Meds:.acetaminophen **OR** acetaminophen, ipratropium-albuterol, morphine injection, ondansetron **OR** ondansetron (ZOFRAN) IV, oxyCODONE, senna-docusate   ASSESMENT:   * Melena.  06/20/2015 EGD, Dr Oneida Alar: Moderate to severe gastritis. Bradycardia and then non STEMI during/post exam. Path: focal erosion, mild chronic active gastritis, no H Pylori. Meds listed PTA: Carafate, BID Protonix, po iron.  05/2015 Capsule endo. Abnormal gastric mucosa, possible erosions. ? Gastric ulcer with dried blood.  05/2015 Colonoscopy, Dr Sydell Axon, for IDA,hx colonic ulcers. Scattered left tics.  01/2015 EGD for IDA, Dr Michail Sermon 02/2015 CT angio abd/pelvis: distal ileal thickening, improvement in previous inflammatory changes.   * Acute on chronic anemia. S/p PRBC x 5, last on 10/02/15. On po Iron PTA.   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 2005 cardiac stent to vein graft. S/p  iliac and SFA interventions, carotid stenosis. S/p left BKA . Chronic Plavix INR still too high for endoscopy today.   * EF 40% (by 06/2015 echo), grade 2 diastolic dysfunction.  Well compensated.   * Recent non STEMI, admit 12/19 - 08/10/15.     PLAN   *  EGD rescheduled for 2/15 at 11 AM.  Hopefully INR will decrease sufficiently to allow for study (1.8 or less).   *  Switch to po Protonix, BID.     Azucena Freed  10/03/2015, 2:09 PM Pager: [phone]

## 2015-10-04 ENCOUNTER — Inpatient Hospital Stay (HOSPITAL_COMMUNITY): Payer: Medicare Other | Admitting: Certified Registered Nurse Anesthetist

## 2015-10-04 ENCOUNTER — Encounter (HOSPITAL_COMMUNITY): Payer: Self-pay | Admitting: Certified Registered Nurse Anesthetist

## 2015-10-04 ENCOUNTER — Encounter (HOSPITAL_COMMUNITY)
Admission: EM | Disposition: A | Discharge status: Home / Self Care | Payer: Self-pay | Source: Home / Self Care | Attending: Family Medicine

## 2015-10-04 DIAGNOSIS — Q2733 Arteriovenous malformation of digestive system vessel: Secondary | ICD-10-CM

## 2015-10-04 DIAGNOSIS — K257 Chronic gastric ulcer without hemorrhage or perforation: Secondary | ICD-10-CM

## 2015-10-04 DIAGNOSIS — K31819 Angiodysplasia of stomach and duodenum without bleeding: Secondary | ICD-10-CM | POA: Diagnosis present

## 2015-10-04 HISTORY — PX: ESOPHAGOGASTRODUODENOSCOPY: SHX5428

## 2015-10-04 LAB — CBC
HEMATOCRIT: 29.9 % — AB (ref 39.0–52.0)
HEMOGLOBIN: 9.6 g/dL — AB (ref 13.0–17.0)
MCH: 26.8 pg (ref 26.0–34.0)
MCHC: 32.1 g/dL (ref 30.0–36.0)
MCV: 83.5 fL (ref 78.0–100.0)
PLATELETS: 174 10*3/uL (ref 150–400)
RBC: 3.58 MIL/uL — AB (ref 4.22–5.81)
RDW: 18.5 % — ABNORMAL HIGH (ref 11.5–15.5)
WBC: 7.3 10*3/uL (ref 4.0–10.5)

## 2015-10-04 LAB — PROTIME-INR
INR: 1.83 — AB (ref 0.00–1.49)
Prothrombin Time: 21.1 seconds — ABNORMAL HIGH (ref 11.6–15.2)

## 2015-10-04 LAB — GLUCOSE, CAPILLARY
GLUCOSE-CAPILLARY: 172 mg/dL — AB (ref 65–99)
GLUCOSE-CAPILLARY: 182 mg/dL — AB (ref 65–99)
Glucose-Capillary: 153 mg/dL — ABNORMAL HIGH (ref 65–99)
Glucose-Capillary: 190 mg/dL — ABNORMAL HIGH (ref 65–99)

## 2015-10-04 LAB — TROPONIN I: Troponin I: 0.04 ng/mL — ABNORMAL HIGH (ref <0.031)

## 2015-10-04 SURGERY — EGD (ESOPHAGOGASTRODUODENOSCOPY)
Anesthesia: Monitor Anesthesia Care

## 2015-10-04 MED ORDER — CLINDAMYCIN PHOSPHATE 600 MG/50ML IV SOLN
600.0000 mg | Freq: Once | INTRAVENOUS | Status: AC
  Administered 2015-10-04: 600 mg via INTRAVENOUS
  Filled 2015-10-04: qty 50

## 2015-10-04 MED ORDER — HEPARIN (PORCINE) IN NACL 100-0.45 UNIT/ML-% IJ SOLN
1500.0000 [IU]/h | INTRAMUSCULAR | Status: DC
  Administered 2015-10-04: 1300 [IU]/h via INTRAVENOUS
  Administered 2015-10-05 – 2015-10-06 (×2): 1500 [IU]/h via INTRAVENOUS
  Filled 2015-10-04 (×3): qty 250

## 2015-10-04 MED ORDER — PROPOFOL 500 MG/50ML IV EMUL
INTRAVENOUS | Status: DC | PRN
  Administered 2015-10-04: 50 ug/kg/min via INTRAVENOUS

## 2015-10-04 MED ORDER — SODIUM CHLORIDE 0.9 % IJ SOLN
PREFILLED_SYRINGE | INTRAMUSCULAR | Status: DC | PRN
  Administered 2015-10-04: 6 mL

## 2015-10-04 MED ORDER — PROPOFOL 10 MG/ML IV BOLUS
INTRAVENOUS | Status: DC | PRN
  Administered 2015-10-04 (×3): 20 mg via INTRAVENOUS

## 2015-10-04 MED ORDER — EPINEPHRINE HCL 0.1 MG/ML IJ SOSY
PREFILLED_SYRINGE | INTRAMUSCULAR | Status: AC
  Filled 2015-10-04: qty 10

## 2015-10-04 MED ORDER — PANTOPRAZOLE SODIUM 40 MG PO TBEC
40.0000 mg | DELAYED_RELEASE_TABLET | Freq: Every day | ORAL | Status: DC
  Administered 2015-10-05: 40 mg via ORAL
  Filled 2015-10-04: qty 1

## 2015-10-04 MED ORDER — NITROGLYCERIN 0.4 MG SL SUBL
SUBLINGUAL_TABLET | SUBLINGUAL | Status: AC
  Filled 2015-10-04: qty 1

## 2015-10-04 MED ORDER — LACTATED RINGERS IV SOLN
INTRAVENOUS | Status: DC
  Administered 2015-10-04 (×2): via INTRAVENOUS

## 2015-10-04 MED ORDER — METOPROLOL TARTRATE 1 MG/ML IV SOLN
5.0000 mg | Freq: Once | INTRAVENOUS | Status: AC
  Administered 2015-10-04: 5 mg via INTRAVENOUS
  Filled 2015-10-04: qty 5

## 2015-10-04 MED ORDER — SODIUM CHLORIDE 0.9 % IV SOLN
INTRAVENOUS | Status: DC
Start: 2015-10-04 — End: 2015-10-04

## 2015-10-04 MED ORDER — FENTANYL CITRATE (PF) 100 MCG/2ML IJ SOLN
25.0000 ug | INTRAMUSCULAR | Status: DC | PRN
  Administered 2015-10-04: 25 ug via INTRAVENOUS

## 2015-10-04 MED ORDER — GLYCOPYRROLATE 0.2 MG/ML IJ SOLN
INTRAMUSCULAR | Status: DC | PRN
Start: 2015-10-04 — End: 2015-10-04
  Administered 2015-10-04: 0.1 mg via INTRAVENOUS

## 2015-10-04 MED ORDER — METOPROLOL TARTRATE 1 MG/ML IV SOLN: 10.0000 mg | Freq: Once | INTRAVENOUS | Status: DC

## 2015-10-04 MED ORDER — FENTANYL CITRATE (PF) 100 MCG/2ML IJ SOLN
INTRAMUSCULAR | Status: AC
  Filled 2015-10-04: qty 2

## 2015-10-04 MED ORDER — ONDANSETRON HCL 4 MG/2ML IJ SOLN: 4.0000 mg | Freq: Once | INTRAMUSCULAR | Status: DC | PRN

## 2015-10-04 MED ORDER — EPHEDRINE SULFATE 50 MG/ML IJ SOLN
INTRAMUSCULAR | Status: DC | PRN
  Administered 2015-10-04: 5 mg via INTRAVENOUS

## 2015-10-04 MED ORDER — NITROGLYCERIN 0.4 MG SL SUBL
0.4000 mg | SUBLINGUAL_TABLET | Freq: Once | SUBLINGUAL | Status: AC
  Administered 2015-10-04: 0.4 mg via SUBLINGUAL

## 2015-10-04 NOTE — Anesthesia Preprocedure Evaluation (Addendum)
Anesthesia Evaluation  Patient identified by MRN, date of birth, ID band Patient awake    Reviewed: Allergy & Precautions, NPO status , Patient's Chart, lab work & pertinent test results  History of Anesthesia Complications Negative for: history of anesthetic complications  Airway Mallampati: II  TM Distance: >3 FB Neck ROM: Full    Dental  (+) Edentulous Upper, Edentulous Lower, Dental Advisory Given, Lower Dentures, Upper Dentures   Pulmonary shortness of breath and with exertion, sleep apnea , COPD,  COPD inhaler, former smoker,    Pulmonary exam normal breath sounds clear to auscultation       Cardiovascular hypertension, Pt. on medications and Pt. on home beta blockers + angina + CAD, + Past MI, + Cardiac Stents (a. 05/13/14 [name] s/p overlapping DESx2 to SVG to RCA. b. 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 2005 cardiac stent to vein graft. S/p iliac and SFA interventions, carotid stenosis. S/p left BKA . Chronic Plavix INR still too high for endoscopy today.   * EF 40% (by 06/2015 echo), grade 2 diastolic dysfunction. Well compensated.   * Recent non STEMI, admit 12/19 - 08/10/15.    Neuro/Psych negative neurological ROS  negative psych ROS   GI/Hepatic Neg liver ROS, GERD  Medicated and Controlled,UGIB   Endo/Other  diabetes, Type 2, Oral Hypoglycemic Agents  Renal/GU Renal disease     Musculoskeletal  (+) Arthritis ,   Abdominal   Peds  Hematology  (+) Blood dyscrasia, anemia , INR 1.83   Anesthesia Other Findings   Reproductive/Obstetrics negative OB ROS                        Anesthesia Physical Anesthesia Plan  ASA: IV  Anesthesia Plan:  MAC   Post-op Pain Management:    Induction: Intravenous  Airway Management Planned: Simple Face Mask  Additional Equipment:   Intra-op Plan:   Post-operative Plan:   Informed Consent: I have reviewed the patients History and Physical, chart, labs and discussed the procedure including the risks, benefits and alternatives for the proposed anesthesia with the patient or authorized representative who has indicated his/her understanding and acceptance.   Dental advisory given  Plan Discussed with: CRNA  Anesthesia Plan Comments: (Discussed risks/benefits/alternatives to MAC sedation including need for ventilatory support, hypotension, need for conversion to general anesthesia.  All patient questions answered.  Patient wished to proceed.)       Anesthesia Quick Evaluation

## 2015-10-04 NOTE — Transfer of Care (Signed)
Immediate Anesthesia Transfer of Care Note  Patient: Tanner Pena  Procedure(s) Performed: Procedure(s): ESOPHAGOGASTRODUODENOSCOPY (EGD) (N/A)  Patient Location: PACU  Anesthesia Type:MAC  Level of Consciousness: awake, alert  and oriented  Airway & Oxygen Therapy: Patient Spontanous Breathing  Post-op Assessment: Report given to RN and Post -op Vital signs reviewed and stable  Post vital signs: stable  Last Vitals:  Filed Vitals:   10/04/15 1019 10/04/15 1141  BP: 168/30   Pulse: 58 83  Temp: 36.7 C   Resp: 13 12    Complications: No apparent anesthesia complications

## 2015-10-04 NOTE — Progress Notes (Signed)
Pt arrived back on 2C from endoscopy complaining of 3/10 chest pain. Pt sitting up in bed clutching chest. [facility] was notified of chest pain prior to return to [facility]. Pt was given 1 nitroglycerin while in endo. Upon arrival to [facility] pt was placed on 2L [place], PRN dose of morphine administered, BP obtained and daily meds administered. BP on arrival to [facility] remained elevated 190's-80's. Scheduled PO metoprolol administered as well as IV dose. In approximately 15 min pt reported decrease in chest pain. Rated pain 1/10. Pt resting comfortably with eyes closed. BP has continued to improve. Nursing to continue to monitor.

## 2015-10-04 NOTE — Progress Notes (Signed)
ANTICOAGULATION CONSULT NOTE - Initial Consult  Pharmacy Consult for heparin Indication: Prosthetic aortic valve  No Known Allergies  Patient Measurements: Height: '5\' 10"'$  (177.8 cm) Weight: 211 lb 6.4 oz (95.89 kg) IBW/kg (Calculated) : 73 Heparin Dosing Weight: ~90 kg  Vital Signs: Temp: 97.7 F (36.5 C) (02/15 1228) Temp Source: Oral (02/15 1228) BP: 192/70 mmHg (02/15 1228) Pulse Rate: 85 (02/15 1228)  Labs:  Recent Labs  10/01/15 2229 10/02/15 0307 10/03/15 0212 10/04/15 0355  HGB 9.2*  --  Pena*  --   HCT 29.8*  --  32.2*  --   PLT 180  --  153  --   LABPROT  --  30.6* 25.7* 21.1*  INR  --  2.99* 2.37* 1.83*  CREATININE  --   --  1.06  --     Estimated Creatinine Clearance: 71.1 mL/min (by C-G formula based on Cr of 1.06).   Medical History: Past Medical History  Diagnosis Date  . Type 2 diabetes mellitus ([place]) 2007  . Essential hypertension   . Gout   . Hypercholesteremia   . Peripheral vascular disease ([place])     Lower extremity PCI/stenting  . COPD (chronic obstructive pulmonary disease) ([place])   . S/P aortic valve replacement 1990    a. St. Jude  . Chronic back pain   . Dysphagia   . Neuromuscular disorder ([place])   . Peripheral neuropathy ([place])   . Critical lower limb ischemia   . CAD (coronary artery disease)     a. 05/13/14 [name] s/p overlapping DESx2 to SVG to RCA. b.  s/p CABG '90 with redo '94 & stent to RCA SVG in 2005  . Chronic toe ulcer ([place])     a. Left foot  . Chronic systolic heart failure ([place])   . History of kidney stones   . GERD (gastroesophageal reflux disease)   . Arthritis   . Sleep apnea   . Myocardial infarction ([place])   . GI bleeding 05/31/2015    Source not identified.  . Acute blood loss anemia 02/2015 & 05/2015    Hemoglobin 5.5 on 05/31/15; status post transfusion.    Assessment: 75yo with St.Jude AVR and GI bleed on Coumadin/Plavix pta, Tanner being evaluated by Gastroenterology. Had EGD today to control  bleeding. Pharmacy consulted to start heparin at 1800 tonight. Plan is to eventually resume coumadin only. Hgb low at Pena, Tanner wnl. No further s/s of bleed.  Goal of Therapy:  Heparin level 0.3-0.7 units/ml Monitor platelets by anticoagulation protocol: Yes   Plan:  No heparin BOLUS Start heparin gtt at 1,300 units/hr Check 8 hr HL Monitor daily HL, CBC, s/s of bleed  May be able to restart coumadin tomorrow if no overt bleeding  Elenor Quinones, PharmD, BCPS Clinical Pharmacist Pager [phone] 10/04/2015 1:58 PM

## 2015-10-04 NOTE — Interval H&P Note (Signed)
History and Physical Interval Note:  10/04/2015 11:01 AM  Tanner Pena  has presented today for surgery, with the diagnosis of anemia, melenic stool, GI bleed.  The various methods of treatment have been discussed with the patient and family. After consideration of risks, benefits and other options for treatment, the patient has consented to  Procedure(s): ESOPHAGOGASTRODUODENOSCOPY (EGD) (N/A) as a surgical intervention .  The patient's history has been reviewed, patient examined, no change in status, stable for surgery.  I have reviewed the patient's chart and labs.  Questions were answered to the patient's satisfaction.     Nelida Meuse III

## 2015-10-04 NOTE — Progress Notes (Signed)
TEAM 1 - Stepdown/ICU TEAM PROGRESS NOTE  ABANOUB HANKEN ZOX:[id number] DOB: [birth year] DOA: 09/30/2015 PCP: [name], MD  Admit HPI / Brief Narrative: 75 y.o. male w/ a hx of multiple prior episodes of melena and non-ST elevated MI, complicated by an aortic mechanical valve that requires anticoagulation with Coumadin. 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  He presented to the ED at [facility] after his melena returned, and was transferred to [facility] because of a reported shortage of blood products at [facility].  HPI/Subjective: The pt reported SSCP which radiated down his L arm shortly after awakening from his EGD.  He denies sob, n/v, or abdom pain.  The pain improved after taking a SL nitro.  He states he has pain like this occasionally at home, for which he sometimes takes nitro, and sometimes takes tums.     Assessment/Plan:  Melena / Gastric ulcer and AVMs EGD today noted healing a 1cm greater curvature ulcer, a 4m antral ulcer, a 452mpyloric channel ulcer, and 3 AVMs which were treated w/ epi and APC - cleared to begin IV heparin tonight - cont PPI daily indef  SSCP w/ hx of CAD s/p CABG 1991 with redo 1994 Experienced SSCP after his endo today - EKG in recovery notable for LBBB which is chronic, making acute usefulness limited - troponin cycle ordered - pt dosed w/ IV BB - to resume oral BB and imdur - Cards alerted and will see in f/u today   Acute blood loss anemia on chronic anemia  S/p 5U PRBC thus far - keep Hgb 8.0 or > - Hgb stable today/improving    H/O aortic valve replacement (St Jude) Coumadin held due to GIB - Cards following - ok to resume IV heparin tonight per GI (orders placed to resume at 6pm - may require sooner if trop increased)  DM2  A1c 6.8 - CBG not ideal - follow w/ resumption of diet post endo  Hyperlipidemia with target LDL less  than 70 Continue statins  Cardiomyopathy, ischemic - EF 25-30% by echo 5/14 Well compensated presently   PVD s/p bilateral LE intervention, s/p L BKA  Acute kidney injury prerenal in the setting of acute GI bleed - resolved w/ volume expansion - renal function has normalized   Code Status: FULL Family Communication: no family present at time of exam Disposition Plan: SDU  [facility]  Procedures: EGD - 2/15 - results noted above   Antibiotics: none  DVT prophylaxis: IV heparin   Objective: Blood pressure 168/30, pulse 83, temperature 98 F (36.7 C), temperature source Oral, resp. rate 12, height '5\' 10"'$  (1.778 m), weight 95.89 kg (211 lb 6.4 oz), SpO2 96 %.  Intake/Output Summary (Last 24 hours) at 10/04/15 1147 Last data filed at 10/04/15 0700  Gross per 24 hour  Intake    480 ml  Output   2100 ml  Net  -1620 ml   Exam: General: No acute respiratory distress- alert and conversant  Lungs: Clear to auscultation bilaterally without wheezes or crackles Cardiovascular: Regular rate and rhythm without murmur gallop or rub Abdomen: Nontender, nondistended, soft, bowel sounds positive, no rebound Extremities: No significant cyanosis, clubbing, or edema   Data Reviewed:  Basic Metabolic Panel:  Recent Labs Lab 09/30/15 0718 10/01/15 0421 10/03/15 0212  NA 141 145 141  K 4.7 4.5 4.1  CL  109 114* 108  CO2 '24 23 23  '$ GLUCOSE 338* 177* 160*  BUN 44* 54* 27*  CREATININE 1.90* 1.42* 1.06  CALCIUM 8.8* 8.6* 9.2    CBC:  Recent Labs Lab 09/30/15 0718 09/30/15 1851 10/01/15 0421 10/01/15 0905 10/01/15 2229 10/03/15 0212  WBC 14.2* 12.9* 10.9* 10.2 11.2* 7.0  NEUTROABS 11.5*  --   --   --   --   --   HGB 6.3* 7.5* 7.3* 7.6* 9.2* 10.1*  HCT 21.4* 24.3* 23.4* 24.2* 29.8* 32.2*  MCV 80.8 79.7 81.0 80.4 81.9 84.1  PLT 288 227 177 171 180 153    Liver Function Tests:  Recent Labs Lab 09/30/15 0718 10/03/15 0212  AST 17 23    ALT 12* 10*  ALKPHOS 22* 27*  BILITOT 0.5 0.4  PROT 5.3* 5.7*  ALBUMIN 2.9* 3.0*    Coags:  Recent Labs Lab 09/30/15 0718 10/01/15 0421 10/02/15 0307 10/03/15 0212 10/04/15 0355  INR 4.39* 3.24* 2.99* 2.37* 1.83*   CBG:  Recent Labs Lab 10/03/15 0810 10/03/15 1244 10/03/15 1702 10/03/15 2054 10/04/15 0849  GLUCAP 148* 184* 167* 185* 172*    Recent Results (from the past 240 hour(s))  MRSA PCR Screening     Status: None   Collection Time: 09/30/15  5:14 PM  Result Value Ref Range Status   MRSA by PCR NEGATIVE NEGATIVE Final    Comment:        The GeneXpert MRSA Assay (FDA approved for NASAL specimens only), is one component of a comprehensive MRSA colonization surveillance program. It is not intended to diagnose MRSA infection nor to guide or monitor treatment for MRSA infections.     Studies:   Recent x-ray studies have been reviewed in detail by the Attending Physician  Scheduled Meds:  Scheduled Meds: . [MAR Hold] insulin aspart  0-5 Units Subcutaneous QHS  . [MAR Hold] insulin aspart  0-9 Units Subcutaneous TID WC  . [MAR Hold] insulin aspart  3 Units Subcutaneous TID WC  . [MAR Hold] isosorbide mononitrate  60 mg Oral Daily  . [MAR Hold] metoprolol succinate  25 mg Oral Daily  . [MAR Hold] omega-3 acid ethyl esters  1 g Oral BID  . [MAR Hold] pantoprazole  40 mg Oral BID  . [MAR Hold] pregabalin  50 mg Oral TID  . [MAR Hold] rosuvastatin  20 mg Oral q1800  . [MAR Hold] sucralfate  1 g Oral TID WC & HS    Time spent on care of this patient: 35 mins   MCCLUNG,JEFFREY T , MD   [facility] Office  [phone] Pager - Text Page per Shea Evans as per below:  On-Call/Text Page:      [email or web address]      password [email or web address]  If 7PM-7AM, please contact night-coverage [email or web address] Password [facility] 10/04/2015, 11:47 AM   LOS: 4 days

## 2015-10-04 NOTE — Anesthesia Postprocedure Evaluation (Signed)
Anesthesia Post Note  Patient: Tanner Pena  Procedure(s) Performed: Procedure(s) (LRB): ESOPHAGOGASTRODUODENOSCOPY (EGD) (N/A)  Patient location during evaluation: PACU Anesthesia Type: MAC Level of consciousness: awake and alert Pain management: pain level controlled Vital Signs Assessment: post-procedure vital signs reviewed and stable Respiratory status: spontaneous breathing, nonlabored ventilation, respiratory function stable and patient connected to nasal cannula oxygen Anesthetic complications: no Comments: Patient in PACU complained of chest pain following procedure where epinephrine was injected. Patient given sublingual nitroglycerin and hospitalist to bedside to evaluate patient.  EKG with no discernible changes compared to preop EKG.  Chest pain relieved after nitro.  Hospitalist comfortable with transferring patient back to stepdown bed and continue to workup chest pain.    Last Vitals:  Filed Vitals:   10/04/15 1211 10/04/15 1228  BP: 205/54 192/70  Pulse: 80 85  Temp:  36.5 C  Resp: 17 25    Last Pain:  Filed Vitals:   10/04/15 1233  PainSc: 1                  Catalina Gravel

## 2015-10-04 NOTE — Progress Notes (Signed)
Shortly after waking up in endoscopy recovery, this patient c/o central chest pressure radiating down left arm.  BP 200/80, HR 70's  He was given a SLNTG with slow improvement of chest pain over several minutes.  Dr Thereasa Solo of the hospitalist service came within 5 minutes to see patient and is currently at bedside.  EGD findings and events reported to him. EKG tech has arrived.  Patient will be transferred to his stepdown bed when Dr Thereasa Solo feels he is ready.  It should be noted that this patient reportedly had post-op chest pain after EGD at outside hospital a few months ago, and suffered a NSTEMI at that time.

## 2015-10-04 NOTE — Op Note (Signed)
[facility] [place], [address]   ENDOSCOPY PROCEDURE REPORT  PATIENT: Tanner Pena, Tanner Pena  MR#: [id number] BIRTHDATE: 09-22-1940 , 64  yrs. old GENDER: male ENDOSCOPIST: Wilfrid Lund, MD REFERRED BY: PROCEDURE DATE:  10/04/2015 PROCEDURE:  EGD w/ control of bleeding ASA CLASS:     Class III INDICATIONS:  melena and acute post hemorrhagic anemia. MEDICATIONS: Monitored anesthesia care TOPICAL ANESTHETIC: none  DESCRIPTION OF PROCEDURE: After the risks benefits and alternatives of the procedure were thoroughly explained, informed consent was obtained.  The Pentax Gastroscope [id number] endoscope was introduced through the mouth and advanced to the second portion of the duodenum , Without limitations.  The instrument was slowly withdrawn as the mucosa was fully examined.      There was a 1 cm clean-based ulcer along the GC, seen both on retroflexion and anteflexion. 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  The one closest to the anterior wall had a central umbilication. 2cc of SM epinephrine was injected underneath that one, and 1cc epinephrine injected underneath the two others. All three AVMs were treated with APC (40 Watts, 1.0 liter/min), with complete obliteration.  The most anterior AVM bled 2-3 cc after treatment, which stopped spontaneously.  2 clips were therefore placed on that site. There was a 90m clean-based ulcer in the antrum and a 4237mclean based shallow ulcer in the pyloric channel. As seen on prior outside EGDs, there was evidence of multiple healed ulcers in the gastric body and antrum.  DUODENUM: There was a 37m78mlean-based shallow ulcer along the medial wall of the second portion of the duodenum.  ESOPHAGUS: The mucosa of the esophagus appeared normal. The scope was then withdrawn from the patient and the procedure completed.  COMPLICATIONS: There were no immediate  complications.  ENDOSCOPIC IMPRESSION: 1.   There was a 1 cm clean-based ulcer along the GC. 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  The one closest to the anterior wall had a central umbilication. 2cc of SM epinephrine was injected underneath that one, and 1cc epinephrine injected underneath the two others. All three AVMs were treated with APC (40 Watts, 1.0 liter/min), with complete obliteration.  The most anterior AVM bled 2-3 cc after treatment, which stopped spontaneously.  2 clips were therefore placed on that site. There was a 4mm35mean-based ulcer in the antrum and a 4mm 337man based shallow ulcer in the pyloric channel 2.   There was a 37mm c1037mn-based shallow ulcer along the medial wall of the second portion of the duodenum 3.   The mucosa of the esophagus appeared normal  It is unknown whether the largest of the gastric ulcers or the AVMs were the source of recent bleeding.  Because the patient has had recurrent bleeding over several months,and because of the umbilicated appearance of one of them,  the AVMs were treated.  RECOMMENDATIONS: Begin unfractionated heparin tonight because the patient has a mechanical heart valve. 600 mg IV clindamycin will be given because intervention was taken. Strong consideration should be given to discontinuing at least one of this patient's antiplatelet agents (preferably aspirin). If there is no overt GI bleeding and hemoglobin stable tomorrow, the patient's coumadin can be resumed, with the goal of keeping INR 2.5-3.5 Continue once daily oral PPI infefinitely.  eSigned:  Henry Wilfrid Lund2/15/2017 11:48 AM    CC:  PATIENT NAME:  Tanner Pena, Tanner Pena  MR#: [id number]

## 2015-10-04 NOTE — Progress Notes (Signed)
Complaining of epigastric and sternal pain Dr Gifford Shave notified.

## 2015-10-04 NOTE — H&P (View-Only) (Signed)
Daily Rounding Note  10/03/2015, 2:09 PM  LOS: 3 days   SUBJECTIVE:       Stools dark, not bloody, yesterday.  No BM today.  No dyspnea or cough. No dizziness.  Feels well. Eating solids  OBJECTIVE:         Vital signs in last 24 hours:    Temp:  [97.3 F (36.3 C)-98.1 F (36.7 C)] 97.6 F (36.4 C) (02/14 1246) Pulse Rate:  [37-66] 55 (02/14 1246) Resp:  [15-19] 15 (02/14 1246) BP: (134-159)/(50-98) 140/61 mmHg (02/14 1246) SpO2:  [92 %-100 %] 100 % (02/14 1246) Last BM Date: 10/02/15 Filed Weights   09/30/15 0701 09/30/15 1646  Weight: 97.977 kg (216 lb) 93.804 kg (206 lb 12.8 oz)   General: pleasant, NAD.  Looks relatively well.    Heart: irreg, irreg Chest: clear but BS on right diminished.  No cough or dyspnea Abdomen: soft, NT, ND.  BS active.   Extremities: no CCE Neuro/Psych:  Pleasant, calm.  Alert, oriented x 3.  No gross deficits.   Intake/Output from previous day: 02/13 0701 - 02/14 0700 In: 960 [P.O.:960] Out: 1050 [Urine:1050]  Intake/Output this shift: Total I/O In: 360 [P.O.:360] Out: 350 [Urine:350]  Lab Results:  Recent Labs  10/01/15 0905 10/01/15 2229 10/03/15 0212  WBC 10.2 11.2* 7.0  HGB 7.6* 9.2* 10.1*  HCT 24.2* 29.8* 32.2*  PLT 171 180 153   BMET  Recent Labs  10/01/15 0421 10/03/15 0212  NA 145 141  K 4.5 4.1  CL 114* 108  CO2 23 23  GLUCOSE 177* 160*  BUN 54* 27*  CREATININE 1.42* 1.06  CALCIUM 8.6* 9.2   LFT  Recent Labs  10/03/15 0212  PROT 5.7*  ALBUMIN 3.0*  AST 23  ALT 10*  ALKPHOS 27*  BILITOT 0.4   PT/INR  Recent Labs  10/02/15 0307 10/03/15 0212  LABPROT 30.6* 25.7*  INR 2.99* 2.37*   Hepatitis Panel No results for input(s): HEPBSAG, HCVAB, HEPAIGM, HEPBIGM in the last 72 hours.  Studies/Results: No results found.   Scheduled Meds: . insulin aspart  0-5 Units Subcutaneous QHS  . insulin aspart  0-9 Units Subcutaneous TID WC  .  insulin aspart  3 Units Subcutaneous TID WC  . isosorbide mononitrate  60 mg Oral Daily  . metoprolol succinate  25 mg Oral Daily  . omega-3 acid ethyl esters  1 g Oral BID  . pantoprazole (PROTONIX) IV  40 mg Intravenous Q12H  . pregabalin  50 mg Oral TID  . rosuvastatin  20 mg Oral q1800  . sucralfate  1 g Oral TID WC & HS   Continuous Infusions:  PRN Meds:.acetaminophen **OR** acetaminophen, ipratropium-albuterol, morphine injection, ondansetron **OR** ondansetron (ZOFRAN) IV, oxyCODONE, senna-docusate   ASSESMENT:   * Melena.  06/20/2015 EGD, Dr Oneida Alar: Moderate to severe gastritis. Bradycardia and then non STEMI during/post exam. Path: focal erosion, mild chronic active gastritis, no H Pylori. Meds listed PTA: Carafate, BID Protonix, po iron.  05/2015 Capsule endo. Abnormal gastric mucosa, possible erosions. ? Gastric ulcer with dried blood.  05/2015 Colonoscopy, Dr Sydell Axon, for IDA,hx colonic ulcers. Scattered left tics.  01/2015 EGD for IDA, Dr Michail Sermon 02/2015 CT angio abd/pelvis: distal ileal thickening, improvement in previous inflammatory changes.   * Acute on chronic anemia. S/p PRBC x 5, last on 10/02/15. On po Iron PTA.   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 2005 cardiac stent to vein graft. S/p  iliac and SFA interventions, carotid stenosis. S/p left BKA . Chronic Plavix INR still too high for endoscopy today.   * EF 40% (by 06/2015 echo), grade 2 diastolic dysfunction.  Well compensated.   * Recent non STEMI, admit 12/19 - 08/10/15.     PLAN   *  EGD rescheduled for 2/15 at 11 AM.  Hopefully INR will decrease sufficiently to allow for study (1.8 or less).   *  Switch to po Protonix, BID.     Azucena Freed  10/03/2015, 2:09 PM Pager: [phone]

## 2015-10-04 NOTE — Progress Notes (Signed)
No improvement in pain,Dr Danis with patient stat ekg order.

## 2015-10-05 ENCOUNTER — Encounter (HOSPITAL_COMMUNITY): Payer: Self-pay | Admitting: Gastroenterology

## 2015-10-05 DIAGNOSIS — Q2730 Arteriovenous malformation, site unspecified: Secondary | ICD-10-CM | POA: Diagnosis present

## 2015-10-05 DIAGNOSIS — I255 Ischemic cardiomyopathy: Secondary | ICD-10-CM | POA: Diagnosis present

## 2015-10-05 DIAGNOSIS — Z952 Presence of prosthetic heart valve: Secondary | ICD-10-CM | POA: Diagnosis present

## 2015-10-05 DIAGNOSIS — K253 Acute gastric ulcer without hemorrhage or perforation: Secondary | ICD-10-CM

## 2015-10-05 DIAGNOSIS — E119 Type 2 diabetes mellitus without complications: Secondary | ICD-10-CM

## 2015-10-05 DIAGNOSIS — I493 Ventricular premature depolarization: Secondary | ICD-10-CM | POA: Diagnosis present

## 2015-10-05 LAB — CBC
HEMATOCRIT: 27.5 % — AB (ref 39.0–52.0)
HEMOGLOBIN: 8.6 g/dL — AB (ref 13.0–17.0)
MCH: 25.9 pg — AB (ref 26.0–34.0)
MCHC: 31.3 g/dL (ref 30.0–36.0)
MCV: 82.8 fL (ref 78.0–100.0)
Platelets: 142 10*3/uL — ABNORMAL LOW (ref 150–400)
RBC: 3.32 MIL/uL — AB (ref 4.22–5.81)
RDW: 18.7 % — ABNORMAL HIGH (ref 11.5–15.5)
WBC: 9.2 10*3/uL (ref 4.0–10.5)

## 2015-10-05 LAB — GLUCOSE, CAPILLARY
GLUCOSE-CAPILLARY: 168 mg/dL — AB (ref 65–99)
GLUCOSE-CAPILLARY: 190 mg/dL — AB (ref 65–99)
GLUCOSE-CAPILLARY: 157 mg/dL — AB (ref 65–99)
GLUCOSE-CAPILLARY: 200 mg/dL — AB (ref 65–99)

## 2015-10-05 LAB — COMPREHENSIVE METABOLIC PANEL
ALBUMIN: 2.9 g/dL — AB (ref 3.5–5.0)
ALK PHOS: 24 U/L — AB (ref 38–126)
ALT: 11 U/L — AB (ref 17–63)
AST: 19 U/L (ref 15–41)
Anion gap: 9 (ref 5–15)
BUN: 13 mg/dL (ref 6–20)
CALCIUM: 9.1 mg/dL (ref 8.9–10.3)
CO2: 23 mmol/L (ref 22–32)
CREATININE: 0.99 mg/dL (ref 0.61–1.24)
Chloride: 107 mmol/L (ref 101–111)
GFR calc Af Amer: >60 mL/min (ref >60)
GFR calc non Af Amer: >60 mL/min (ref >60)
GLUCOSE: 204 mg/dL — AB (ref 65–99)
Potassium: 4 mmol/L (ref 3.5–5.1)
SODIUM: 139 mmol/L (ref 135–145)
Total Bilirubin: 0.6 mg/dL (ref 0.3–1.2)
Total Protein: 4.8 g/dL — ABNORMAL LOW (ref 6.5–8.1)

## 2015-10-05 LAB — HEMOGLOBIN AND HEMATOCRIT, BLOOD
HCT: 30.4 % — ABNORMAL LOW (ref 39.0–52.0)
Hemoglobin: 9.3 g/dL — ABNORMAL LOW (ref 13.0–17.0)

## 2015-10-05 LAB — HEPARIN LEVEL (UNFRACTIONATED)
HEPARIN UNFRACTIONATED: 0.17 [IU]/mL — AB (ref 0.30–0.70)
HEPARIN UNFRACTIONATED: 0.43 [IU]/mL (ref 0.30–0.70)
Heparin Unfractionated: 0.35 IU/mL (ref 0.30–0.70)

## 2015-10-05 LAB — PROTIME-INR
INR: 1.46 (ref 0.00–1.49)
Prothrombin Time: 17.8 seconds — ABNORMAL HIGH (ref 11.6–15.2)

## 2015-10-05 MED ORDER — PANTOPRAZOLE SODIUM 40 MG PO TBEC
40.0000 mg | DELAYED_RELEASE_TABLET | Freq: Two times a day (BID) | ORAL | Status: DC
  Administered 2015-10-05 – 2015-10-14 (×18): 40 mg via ORAL
  Filled 2015-10-05 (×18): qty 1

## 2015-10-05 NOTE — Progress Notes (Signed)
Patient: Tanner Pena / Admit Date: 09/30/2015 / Date of Encounter: 10/05/2015, 9:05 AM   Subjective: Feeling good this AM. No CP or SOB. Brief episode of CP yesterday in setting of high BP, relieved with SL NTG. Has not had a BM yet today.   Objective: Telemetry: NSR with frequent PVCs in bigeminy and trigeminy Physical Exam: Blood pressure 124/53, pulse 31, temperature 97.6 F (36.4 C), temperature source Oral, resp. rate 20, height '5\' 10"'$  (1.778 m), weight 213 lb 6.4 oz (96.798 kg), SpO2 97 %. General: Well developed, well nourished WM in no acute distress. Head: Normocephalic, atraumatic, sclera non-icteric, no xanthomas, nares are without discharge. Neck: JVP not elevated. Lungs: Diminished BS L base. NO wheezes, rales, or rhonchi. Breathing is unlabored. Heart: RRR frequent ectopy S1 S2 without murmurs, rubs, or gallops.  Abdomen: Soft, non-tender, non-distended with normoactive bowel sounds. No rebound/guarding. Extremities: No clubbing or cyanosis. S/p L BKA.  Neuro: Alert and oriented X 3. Moves all extremities spontaneously. Psych:  Responds to questions appropriately with a normal affect.   Intake/Output Summary (Last 24 hours) at 10/05/15 0905 Last data filed at 10/05/15 0741  Gross per 24 hour  Intake  39.22 ml  Output   1250 ml  Net -1210.78 ml    Inpatient Medications:  . insulin aspart  0-5 Units Subcutaneous QHS  . insulin aspart  0-9 Units Subcutaneous TID WC  . insulin aspart  3 Units Subcutaneous TID WC  . isosorbide mononitrate  60 mg Oral Daily  . metoprolol succinate  25 mg Oral Daily  . omega-3 acid ethyl esters  1 g Oral BID  . pantoprazole  40 mg Oral Daily  . pregabalin  50 mg Oral TID  . rosuvastatin  20 mg Oral q1800  . sucralfate  1 g Oral TID WC & HS   Infusions:  . heparin 1,500 Units/hr (10/05/15 0307)    Labs:  Recent Labs  10/03/15 0212 10/05/15 0145  NA 141 139  K 4.1 4.0  CL 108 107  CO2 23 23  GLUCOSE 160* 204*  BUN  27* 13  CREATININE 1.06 0.99  CALCIUM 9.2 9.1    Recent Labs  10/03/15 0212 10/05/15 0145  AST 23 19  ALT 10* 11*  ALKPHOS 27* 24*  BILITOT 0.4 0.6  PROT 5.7* 4.8*  ALBUMIN 3.0* 2.9*    Recent Labs  10/04/15 1450 10/05/15 0145  WBC 7.3 9.2  HGB 9.6* 8.6*  HCT 29.9* 27.5*  MCV 83.5 82.8  PLT 174 142*    Recent Labs  10/04/15 1450  TROPONINI 0.04*   Invalid input(s): POCBNP No results for input(s): HGBA1C in the last 72 hours.   Radiology/Studies:  No results found.   Assessment and Plan  27 M with HTN, DM II, OSA, carotid artery stenosis, CAD (s/p CABG 1991 with redo 1994, stent to RCA 2005, last PCI overlapping DES to SVG-RCA in 04/2014, nonischemic nuc 07/2015), h/o recurrent GIB/ABL anemia (with multiple admissions ruling in for NSTEMI simultaneously in 2016), PVD s/p bilateral LE intervention, frequent PVCs, chronic systolic CHF/ICM, s/p L BKA, and h/o AS s/p St Jude AVR presented with supratherapeutic INR and GI bleed.   1. Recurrent GIB with acute on chronic anemia - endoscopy 10/04/15 with gastric ulcers and AVMs s/p APC - unknown if these were the source of recent bleeding. GI recommended clindamycin and indefinite PPI. GI cleared patient to go back on heparin with plans to resume Coumadin if stable today. Hgb has  fallen another gram so will defer decision making to IM/GI regarding anticoag plans. GI also recommended to consider discontinuing at least one of patient's antiplatelets (preferably ASA) - the patient was only on Coumadin prior to admission and suspect this is the safest thing to continue solo long-term with strict INR monitoring.  Would restart Warfarin now, no ASA or Plavix.    Need to bridge with IV Heparin for INR > 2 x 2 days.  2. H/o St. Jude AVR - see above re: anticoag.  3. CAD s/p CABG + redo/PCI - last intervention 04/2014, no absolute indication for Plavix. Note he was not on this prior to admission. He had brief episode of chest pain  10/04/15 in the setting of high BP. Nonischemic nuc 07/2015 in the face of persistently positive troponins. Would continue antianginal therapy as he is not currently a good candidate for repeat intervention at this time. Would continue anti-anginal therapy and consider increasing Imdur if BP remains stable.  4. Frequent PVCs - this appears to have been the case per prior EKGs as well. HR precludes further titration of BB. Continue Toprol.   5. ICM/chronic systolic CHF EF 09% by echo 06/2015 - volume appears stable.  - Back on beta blocker. Would add back ACE inhibitor tomorrow.  Signed, Melina Copa PA-C Pager: [phone]  I have seen, examined and evaluated the patient this PM along with Mrs. Dunn, PA-C.  After reviewing all the available data and chart,  I agree with her findings, examination as well as impression recommendations.  Doing relatively well now. GI procedure appreciated AVMs and also noted. Per their recommendations, we can restart anticoagulation without aspirin or Plavix. I am agreeable with no aspirin or Plavix as he is not had any stents recently.  However he does need to restart warfarin 2 get a target INR of 2.5-3.5. Will need IV heparin bridging. No data for DOAC or Lovenox.  Remains on statin as well as beta blocker and Imdur for CAD. Heart rate precludes of titrating the beta blocker or up any further. If blood pressure continues to be stable to elevated, I would restart ACE inhibitor as well for afterload reduction.    Leonie Man, M.D., M.S. Interventional Cardiologist   Pager # [phone] Phone # [phone] [address] [place],  [address]

## 2015-10-05 NOTE — Progress Notes (Signed)
[place] for Heparin (warfarin on hold) Indication: AVR  No Known Allergies  Patient Measurements: Height: '5\' 10"'$  (177.8 cm) Weight: 213 lb 6.4 oz (96.798 kg) IBW/kg (Calculated) : 73 Vital Signs: Temp: 97.5 F (36.4 C) (02/16 1713) Temp Source: Oral (02/16 1713) BP: 111/54 mmHg (02/16 1713) Pulse Rate: 55 (02/16 1713)  Labs:  Recent Labs  10/03/15 0212 10/04/15 0355 10/04/15 1450 10/05/15 0145 10/05/15 1023 10/05/15 1800  HGB 10.1*  --  9.6* 8.6* 9.3*  --   HCT 32.2*  --  29.9* 27.5* 30.4*  --   PLT 153  --  174 142*  --   --   LABPROT 25.7* 21.1*  --  17.8*  --   --   INR 2.37* 1.83*  --  1.46  --   --   HEPARINUNFRC  --   --   --  0.17* 0.35 0.43  CREATININE 1.06  --   --  0.99  --   --   TROPONINI  --   --  0.04*  --   --   --     Estimated Creatinine Clearance: 76.4 mL/min (by C-G formula based on Cr of 0.99).  Assessment: On Coumadin 2.'5mg'$  daily exc '5mg'$  on Thur/Sun for St.Jude AVR.  Holding coumadin per Cards (understandable that his thrombotic risk will be high off plavix and coumadin temporarily, however at current time, his risk of bleeding is higher than stroke) Would clarify before restarting coumadin. Currently on heparin with last HL therapeutic at 0.35 after rate increase. INR down to 1.46. Hgb low at 8.6, plts 142.  Evening heparin level therapeutic.  Goal of Therapy:  Heparin level 0.3-0.5 units/ml Monitor platelets by anticoagulation protocol: Yes   Plan:  Continue heparin gtt at 1500 units/hr Monitor daily HL, CBC, s/sx of bleed   Tanner Pena  10/05/2015 .6:40 PM

## 2015-10-05 NOTE — Care Management Important Message (Signed)
Important Message  Patient Details  Name: Tanner Pena MRN: [id number] Date of Birth: Jun 06, 1941   Medicare Important Message Given:  Yes    Nathen May 10/05/2015, 12:32 PM

## 2015-10-05 NOTE — Progress Notes (Signed)
ANTICOAGULATION CONSULT NOTE - Follow Up Consult  Pharmacy Consult for Heparin (warfarin on hold) Indication: AVR  No Known Allergies  Patient Measurements: Height: '5\' 10"'$  (177.8 cm) Weight: 213 lb 6.4 oz (96.798 kg) IBW/kg (Calculated) : 73 Vital Signs: Temp: 97.6 F (36.4 C) (02/16 0828) Temp Source: Oral (02/16 0828) BP: 124/53 mmHg (02/16 0828) Pulse Rate: 31 (02/16 0828)  Labs:  Recent Labs  10/03/15 0212 10/04/15 0355 10/04/15 1450 10/05/15 0145 10/05/15 1023  HGB 10.1*  --  9.6* 8.6* 9.3*  HCT 32.2*  --  29.9* 27.5* 30.4*  PLT 153  --  174 142*  --   LABPROT 25.7* 21.1*  --  17.8*  --   INR 2.37* 1.83*  --  1.46  --   HEPARINUNFRC  --   --   --  0.17* 0.35  CREATININE 1.06  --   --  0.99  --   TROPONINI  --   --  0.04*  --   --     Estimated Creatinine Clearance: 76.4 mL/min (by C-G formula based on Cr of 0.99).  Assessment: On Coumadin 2.'5mg'$  daily exc '5mg'$  on Thur/Sun for St.Jude AVR.  Holding coumadin per Cards (understandable that his thrombotic risk will be high off plavix and coumadin temporarily, however at current time, his risk of bleeding is higher than stroke) Would clarify before restarting coumadin. Currently on heparin with last HL therapeutic at 0.35 after rate increase. INR down to 1.46. Hgb low at 8.6, plts 142.  Goal of Therapy:  Heparin level 0.3-0.5 units/ml Monitor platelets by anticoagulation protocol: Yes   Plan:  Continue heparin gtt at 1500 units/hr Check confirmatory HL tonight Monitor daily HL, CBC, s/s of bleed  Consider restarting coumadin tomorrow if Hgb stable overnight  Elenor Quinones, PharmD, [facility] Clinical Pharmacist Pager [phone] 10/05/2015 11:09 AM

## 2015-10-05 NOTE — Progress Notes (Signed)
ANTICOAGULATION CONSULT NOTE - Follow Up Consult  Pharmacy Consult for Heparin (warfarin on hold) Indication: AVR  No Known Allergies  Patient Measurements: Height: '5\' 10"'$  (177.8 cm) Weight: 211 lb 6.4 oz (95.89 kg) IBW/kg (Calculated) : 73 Vital Signs: Temp: 98.3 F (36.8 C) (02/15 2303) Temp Source: Oral (02/15 2303) BP: 92/81 mmHg (02/15 2303) Pulse Rate: 59 (02/15 2303)  Labs:  Recent Labs  10/03/15 0212 10/04/15 0355 10/04/15 1450 10/05/15 0145  HGB 10.1*  --  9.6* 8.6*  HCT 32.2*  --  29.9* 27.5*  PLT 153  --  174 142*  LABPROT 25.7* 21.1*  --  17.8*  INR 2.37* 1.83*  --  1.46  HEPARINUNFRC  --   --   --  0.17*  CREATININE 1.06  --   --  0.99  TROPONINI  --   --  0.04*  --     Estimated Creatinine Clearance: 76.1 mL/min (by C-G formula based on Cr of 0.99).  Assessment: Heparin while warfarin on hold, multiple ulcers on EGD, initial heparin level is low at 0.17, no issues per RN.   Goal of Therapy:  Heparin level 0.3-0.5 units/ml Monitor platelets by anticoagulation protocol: Yes   Plan:  -Increase heparin drip to 1500 units/hr -1100 HL  Zevin Nevares 10/05/2015,2:50 AM

## 2015-10-05 NOTE — Progress Notes (Signed)
[place] TEAM 1 - Stepdown/ICU TEAM Progress Note  Tanner Pena HWT:[id number] DOB: [birth year] DOA: 09/30/2015 PCP: [name], MD  Admit HPI / Brief Narrative: 75 y.o. 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 Jude AV replacement (on Coumadin), chronic systolic CHF, NSTEMI, HLD, chronic pain syndrome (back/neuropathy), OSA, acute blood loss anemia, Multiple episodes Melena, Erosive Gastritis  On prior endoscopic studies he was found to have erosive gastritis and has been maintained on twice daily PPI and Carafate. He presented to the ED at [facility] after his melena returned, and was transferred to [facility] because of a reported shortage of blood products at [facility].  HPI/Subjective: 2/16 A/O 4, NAD. States negative BM overnight. Negative, negative hematemesis, negative N/V .  Assessment/Plan:  Melena / UGIB 2dary multiple AVM + multiple Gastric ulcer -Sulfate 1 gm TID -Protonix 40 mg BID - Acute blood loss anemia on chronic anemia  -S/p 5U PRBC thus far  - Transfuse for hemoglobin <8  -2/16 patient's H/H appears to a dropped one unit overnight repeat H/H to ensure true drop.  H/O aortic valve replacement (St Jude) -If patient's hemoglobin stable overnight would restart Coumadin in the A.m. monitor closely secondary to patient's propensity for bleeding  - Cards following   CAD s/p CABG 1991 with redo 1994 -no recurrent angina  DM Type 2 controlled -A1c 6.8  -Continue sensitive SSI -Continue NovoLog 3 units QAC    Hyperlipidemia with target LDL less than 70 -Continue Crestor 20 mg daily   Cardiomyopathy, ischemic - EF 25-30% by echo 5/14 -Well compensated presently  -Strict in and out since admission -3 L -Daily weight on admission= 93.8 kg         2/16 weight= 96.7 kg (accuracy?)         PVD s/p bilateral LE intervention, s/p L BKA  Acute kidney injury -prerenal in the setting of acute GI bleed    - Resolved with transfusions     Code Status: FULL Family Communication:  None present at time of exam Disposition Plan: Per GI    [facility]  Dr. Nelida Meuse III [facility]  Procedure/Significant Events: 2/15 EGD;- 1 cm clean-based ulcer along the GC.-3 AVMs (2-4 mm indiameter) were seen in the gastric blody along the GC.The one closest to the anterior wall had a central umbilication. -2cc of SM epinephrine was injected underneath that one, and 1cc epinephrine injected underneath the two others. -All three AVMs were treated with APC (40 Watts, 1.0 liter/min), with complete obliteration. The most anterior AVM bled 2-3 cc after treatment, which stopped spontaneously. -2 clips were therefore placed on that site. -7m clean-based ulcer in the antrum and a 431mclean based shallow ulcer in the pyloric channel - 39m70mlean-based shallow ulcer along the medial wall of the second portion of the duodenum    Culture NA  Antibiotics: NA  DVT prophylaxis: SCD  Devices NA   LINES / TUBES:  NA    Continuous Infusions: . heparin 1,500 Units/hr (10/05/15 0307)    Objective: VITAL SIGNS: Temp: 97.6 F (36.4 C) (02/16 0828) Temp Source: Oral (02/16 0828) BP: 124/53 mmHg (02/16 0828) Pulse Rate: 31 (02/16 0828) SPO2; FIO2:   Intake/Output Summary (Last 24 hours) at 10/05/15 1026 Last data filed at 10/05/15 0741  Gross per 24 hour  Intake  39.22 ml  Output   1250 ml  Net -1210.78 ml  Exam: General:  A/O 4, NAD, No acute respiratory distress Eyes: Negative headache, negative scleral hemorrhage ENT: Negative Runny nose, negative gingival bleeding, Neck:  Negative scars, masses, torticollis, lymphadenopathy, JVD Lungs: Clear to auscultation bilaterally without wheezes or crackles Cardiovascular: Regular rate and rhythm without murmur gallop or rub loud  S1 and S2 Abdomen:negative abdominal pain, nondistended, positive soft,  bowel sounds, no rebound, no ascites, no appreciable mass Extremities: No significant cyanosis, clubbing, or edema bilateral lower extremities Psychiatric:  Negative depression, negative anxiety, negative fatigue, negative mania Neurologic:  Cranial nerves II through XII intact, tongue/uvula midline, all extremities muscle strength 5/5, sensation intact throughout, finger nose finger bilateral within normal limits, quick finger touch bilateral within normal limits, negative dysarthria, negative expressive aphasia, negative receptive aphasia.   Data Reviewed: Basic Metabolic Panel:  Recent Labs Lab 09/30/15 0718 10/01/15 0421 10/03/15 0212 10/05/15 0145  NA 141 145 141 139  K 4.7 4.5 4.1 4.0  CL 109 114* 108 107  CO2 '24 23 23 23  '$ GLUCOSE 338* 177* 160* 204*  BUN 44* 54* 27* 13  CREATININE 1.90* 1.42* 1.06 0.99  CALCIUM 8.8* 8.6* 9.2 9.1   Liver Function Tests:  Recent Labs Lab 09/30/15 0718 10/03/15 0212 10/05/15 0145  AST '17 23 19  '$ ALT 12* 10* 11*  ALKPHOS 22* 27* 24*  BILITOT 0.5 0.4 0.6  PROT 5.3* 5.7* 4.8*  ALBUMIN 2.9* 3.0* 2.9*   No results for input(s): LIPASE, AMYLASE in the last 168 hours. No results for input(s): AMMONIA in the last 168 hours. CBC:  Recent Labs Lab 09/30/15 0718  10/01/15 0905 10/01/15 2229 10/03/15 0212 10/04/15 1450 10/05/15 0145  WBC 14.2*  < > 10.2 11.2* 7.0 7.3 9.2  NEUTROABS 11.5*  --   --   --   --   --   --   HGB 6.3*  < > 7.6* 9.2* 10.1* 9.6* 8.6*  HCT 21.4*  < > 24.2* 29.8* 32.2* 29.9* 27.5*  MCV 80.8  < > 80.4 81.9 84.1 83.5 82.8  PLT 288  < > 171 180 153 174 142*  < > = values in this interval not displayed. Cardiac Enzymes:  Recent Labs Lab 10/04/15 1450  TROPONINI 0.04*   BNP (last 3 results)  Recent Labs  12/28/14 0134 02/04/15 0413 07/28/15 1944  BNP 504.3* 593.2* 156.0*    ProBNP (last 3 results) No results for input(s): PROBNP in the last 8760 hours.  CBG:  Recent Labs Lab 10/04/15 0849  10/04/15 1230 10/04/15 1659 10/04/15 2228 10/05/15 0827  GLUCAP 172* 182* 153* 190* 168*    Recent Results (from the past 240 hour(s))  MRSA PCR Screening     Status: None   Collection Time: 09/30/15  5:14 PM  Result Value Ref Range Status   MRSA by PCR NEGATIVE NEGATIVE Final    Comment:        The GeneXpert MRSA Assay (FDA approved for NASAL specimens only), is one component of a comprehensive MRSA colonization surveillance program. It is not intended to diagnose MRSA infection nor to guide or monitor treatment for MRSA infections.      Studies:  Recent x-ray studies have been reviewed in detail by the Attending Physician  Scheduled Meds:  Scheduled Meds: . insulin aspart  0-5 Units Subcutaneous QHS  . insulin aspart  0-9 Units Subcutaneous TID WC  . insulin aspart  3 Units Subcutaneous TID WC  . isosorbide mononitrate  60 mg Oral Daily  . metoprolol succinate  25 mg Oral Daily  . omega-3 acid ethyl esters  1 g Oral BID  . pantoprazole  40 mg Oral BID  . pregabalin  50 mg Oral TID  . rosuvastatin  20 mg Oral q1800  . sucralfate  1 g Oral TID WC & HS    Time spent on care of this patient: 40 mins   WOODS, Geraldo Docker , MD  [facility] Office  [phone] Pager - [phone]  On-Call/Text Page:      [email or web address]      password [email or web address]  If 7PM-7AM, please contact night-coverage [email or web address] Password [email or web address] 10/05/2015, 10:26 AM   LOS: 5 days   Care during the described time interval was provided by me .  I have reviewed this patient's available data, including medical history, events of note, physical examination, and all test results as part of my evaluation. I have personally reviewed and interpreted all radiology studies.   Dia Crawford, MD [phone] Pager

## 2015-10-05 NOTE — Progress Notes (Signed)
     [facility] Progress Note  Subjective:  Feels good.  Would like to eat.  No BM/no sign of GI bleeding.  No abdominal pain.  Objective:  Vital signs in last 24 hours: Temp:  [97.6 F (36.4 C)-98.3 F (36.8 C)] 97.6 F (36.4 C) (02/16 0828) Pulse Rate:  [27-91] 31 (02/16 0828) Resp:  [15-26] 20 (02/16 0828) BP: (92-210)/(40-81) 124/53 mmHg (02/16 0828) SpO2:  [94 %-100 %] 97 % (02/16 0828) Weight:  [213 lb 6.4 oz (96.798 kg)] 213 lb 6.4 oz (96.798 kg) (02/16 0309) Last BM Date: 10/02/15 General:  Alert, chronically ill-appearing, in NAD Heart:  Regular rate and rhythm; no murmurs Pulm:  CTAB.  No W/R/R. Abdomen:  Soft, non-distended. Normal bowel sounds.  Non-tender. Extremities:  Without edema.  S/p Left BKA. Neurologic:  Alert and oriented x 4;  grossly normal neurologically. Psych:  Alert and cooperative. Normal mood and affect.  Intake/Output from previous day: 02/15 0701 - 02/16 0700 In: 39.2 [I.V.:39.2] Out: 775 [Urine:775] Intake/Output this shift: Total I/O In: -  Out: 475 [Urine:475]  Lab Results:  Recent Labs  10/03/15 0212 10/04/15 1450 10/05/15 0145 10/05/15 1023  WBC 7.0 7.3 9.2  --   HGB 10.1* 9.6* 8.6* 9.3*  HCT 32.2* 29.9* 27.5* 30.4*  PLT 153 174 142*  --    BMET  Recent Labs  10/03/15 0212 10/05/15 0145  NA 141 139  K 4.1 4.0  CL 108 107  CO2 23 23  GLUCOSE 160* 204*  BUN 27* 13  CREATININE 1.06 0.99  CALCIUM 9.2 9.1   LFT  Recent Labs  10/05/15 0145  PROT 4.8*  ALBUMIN 2.9*  AST 19  ALT 11*  ALKPHOS 24*  BILITOT 0.6   PT/INR  Recent Labs  10/04/15 0355 10/05/15 0145  LABPROT 21.1* 17.8*  INR 1.83* 1.46   Assessment / Plan: *Melena/UGIB:  Likely either from gastric ulcer or AVM seen on EGD 2/15.  Ulcer was clean based and AVM's were treated with epi, APC, and one had endo-clips applied.  No further sign of GI bleeding.     *Acute on chronic anemia:  Acutely from GI losses.  S/p PRBC x 5, last on  10/02/15.  On po Iron PTA.  Hgb drifted down slightly this AM but repeat showed that it was back up and stable.  No sign of overt GI bleeding.     *Chronic Coumadin for hx St Jude AVR.  2005 cardiac stent to vein graft.  S/p iliac and SFA interventions, carotid stenosis.  S/p left BKA .  Chronic Plavix.  *EF 40% (by 06/2015 echo), grade 2 diastolic dysfunction.  Well compensated.   *Recent non-STEMI, admit 12/19 - 08/10/15   -Ok to restart Coumadin.  Hopefully can avoid treatment with ASA.  Will advance diet to regular/heart healthy.  GI signing off.   LOS: 5 days   ZEHR, JESSICA D.  10/05/2015, 11:44 AM  Pager number [phone]

## 2015-10-06 ENCOUNTER — Ambulatory Visit: Payer: Medicare Other | Admitting: Cardiovascular Disease

## 2015-10-06 LAB — CBC
HCT: 26.9 % — ABNORMAL LOW (ref 39.0–52.0)
HEMATOCRIT: 29.2 % — AB (ref 39.0–52.0)
HEMOGLOBIN: 9 g/dL — AB (ref 13.0–17.0)
Hemoglobin: 8.5 g/dL — ABNORMAL LOW (ref 13.0–17.0)
MCH: 26.6 pg (ref 26.0–34.0)
MCH: 26 pg (ref 26.0–34.0)
MCHC: 31.6 g/dL (ref 30.0–36.0)
MCHC: 30.8 g/dL (ref 30.0–36.0)
MCV: 84.3 fL (ref 78.0–100.0)
MCV: 84.4 fL (ref 78.0–100.0)
PLATELETS: 147 10*3/uL — AB (ref 150–400)
Platelets: 164 10*3/uL (ref 150–400)
RBC: 3.19 MIL/uL — AB (ref 4.22–5.81)
RBC: 3.46 MIL/uL — ABNORMAL LOW (ref 4.22–5.81)
RDW: 18.8 % — AB (ref 11.5–15.5)
RDW: 18.7 % — AB (ref 11.5–15.5)
WBC: 6.8 10*3/uL (ref 4.0–10.5)
WBC: 7.7 10*3/uL (ref 4.0–10.5)

## 2015-10-06 LAB — GLUCOSE, CAPILLARY
GLUCOSE-CAPILLARY: 127 mg/dL — AB (ref 65–99)
Glucose-Capillary: 158 mg/dL — ABNORMAL HIGH (ref 65–99)
Glucose-Capillary: 182 mg/dL — ABNORMAL HIGH (ref 65–99)
Glucose-Capillary: 132 mg/dL — ABNORMAL HIGH (ref 65–99)

## 2015-10-06 LAB — PROTIME-INR
INR: 1.33 (ref 0.00–1.49)
PROTHROMBIN TIME: 16.6 s — AB (ref 11.6–15.2)

## 2015-10-06 LAB — HEPARIN LEVEL (UNFRACTIONATED): Heparin Unfractionated: 0.5 IU/mL (ref 0.30–0.70)

## 2015-10-06 MED ORDER — RAMIPRIL 2.5 MG PO CAPS
2.5000 mg | ORAL_CAPSULE | Freq: Every day | ORAL | Status: DC
  Administered 2015-10-06 – 2015-10-14 (×9): 2.5 mg via ORAL
  Filled 2015-10-06 (×9): qty 1

## 2015-10-06 MED ORDER — WARFARIN - PHARMACIST DOSING INPATIENT
Freq: Every day | Status: DC
  Administered 2015-10-07 – 2015-10-11 (×4)

## 2015-10-06 MED ORDER — HEPARIN (PORCINE) IN NACL 100-0.45 UNIT/ML-% IJ SOLN
1450.0000 [IU]/h | INTRAMUSCULAR | Status: DC
  Administered 2015-10-06 – 2015-10-08 (×4): 1450 [IU]/h via INTRAVENOUS
  Filled 2015-10-06 (×3): qty 250

## 2015-10-06 MED ORDER — GLIMEPIRIDE 1 MG PO TABS
1.0000 mg | ORAL_TABLET | Freq: Two times a day (BID) | ORAL | Status: DC
  Administered 2015-10-06 – 2015-10-14 (×16): 1 mg via ORAL
  Filled 2015-10-06 (×19): qty 1

## 2015-10-06 MED ORDER — WARFARIN SODIUM 5 MG PO TABS
5.0000 mg | ORAL_TABLET | Freq: Once | ORAL | Status: AC
  Administered 2015-10-06: 5 mg via ORAL
  Filled 2015-10-06: qty 1

## 2015-10-06 NOTE — Progress Notes (Signed)
[facility] TEAM 1 - Stepdown/ICU TEAM PROGRESS NOTE  Tanner Pena DJT:[id number] DOB: 1940-09-28 DOA: 09/30/2015 PCP: [name], MD  Admit HPI / Brief Narrative: 75 y.o. male w/ a hx of multiple prior episodes of melena and non-ST elevated MI, complicated by an aortic mechanical valve that requires anticoagulation with Coumadin. 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  He presented to the ED at [facility] after his melena returned, and was transferred to [facility] because of a reported shortage of blood products at [facility].  HPI/Subjective: The patient is sitting up on the side of the bed.  He is in a good mood and denies any complaints.  He specifically denies chest pain shortness of breath fevers chills nausea or vomiting.  He states that his most recent bowel movement was brown in color.     Assessment/Plan:  Melena / Gastric ulcer and AVMs EGD 2/15 noted a healing 1cm greater curvature ulcer, a 28m antral ulcer, a 419mpyloric channel ulcer, and 3 AVMs which were treated w/ epi and APC - cont PPI daily indef - watch for re-bleeding w/ resumption of warfarin   SSCP w/ hx of CAD s/p CABG 1991 with redo 1994 Experienced SSCP after his endo - EKG in recovery notable for LBBB which is chronic, making acute usefulness limited - troponin was unrevealing x1 (ordered as 3 lab panel but not done for unclear reasons) - no more pain - Cards does not feel further eval indicated   Acute blood loss anemia on chronic anemia  S/p 5U PRBC thus far - keep Hgb 8.0 or > - Hgb fluctuating, but not convinced this is due to blood loss - cont to follow in serial fashion   H/O aortic valve replacement (St Jude) Coumadin initially held due to GIB - have resumed IV heparin - resume warfarin today - needs to remain hospitalized until INR at goal to assure does not re-bleed (high risk to do so)  DM2  A1c  6.8 - CBG remains above goal - adjust tx and follow   Hyperlipidemia with target LDL less than 70 Continue statins  Cardiomyopathy, ischemic - EF 25-30% by echo 5/14 Well compensated presently   PVD s/p bilateral LE intervention, s/p L BKA  Acute kidney injury prerenal in the setting of acute GI bleed - resolved w/ volume expansion - renal function has normalized   Code Status: FULL Family Communication: no family present at time of exam Disposition Plan: stable for transfer to tele bed - PT/OT - resume warfarin and await therapeutic INR   [facility]  [facility]  Procedures: EGD - 2/15 - results noted above   Antibiotics: none  DVT prophylaxis: IV heparin > warfarin   Objective: Blood pressure 140/54, pulse 73, temperature 97.8 F (36.6 C), temperature source Oral, resp. rate 23, height '5\' 10"'$  (1.778 m), weight 96.208 kg (212 lb 1.6 oz), SpO2 99 %.  Intake/Output Summary (Last 24 hours) at 10/06/15 1006 Last data filed at 10/06/15 0900  Gross per 24 hour  Intake    645 ml  Output   2500 ml  Net  -1855 ml   Exam: General: No acute respiratory distress- alert  Lungs: Clear to auscultation bilaterally - no wheezes or crackles Cardiovascular: Regular rate and rhythm without murmur gallop or rub Abdomen: Nontender, nondistended, soft, bowel sounds positive, no rebound Extremities: No significant cyanosis, clubbing, edema  Data Reviewed:  Basic Metabolic Panel:  Recent Labs Lab 09/30/15 0718 10/01/15 0421 10/03/15 0212 10/05/15 0145  NA 141 145 141 139  K 4.7 4.5 4.1 4.0  CL 109 114* 108 107  CO2 '24 23 23 23  '$ GLUCOSE 338* 177* 160* 204*  BUN 44* 54* 27* 13  CREATININE 1.90* 1.42* 1.06 0.99  CALCIUM 8.8* 8.6* 9.2 9.1    CBC:  Recent Labs Lab 09/30/15 0718  10/01/15 2229 10/03/15 0212 10/04/15 1450 10/05/15 0145 10/05/15 1023 10/06/15 0400  WBC 14.2*  < > 11.2* 7.0 7.3 9.2  --  6.8  NEUTROABS 11.5*  --   --   --   --   --   --    --   HGB 6.3*  < > 9.2* 10.1* 9.6* 8.6* 9.3* 8.5*  HCT 21.4*  < > 29.8* 32.2* 29.9* 27.5* 30.4* 26.9*  MCV 80.8  < > 81.9 84.1 83.5 82.8  --  84.3  PLT 288  < > 180 153 174 142*  --  147*  < > = values in this interval not displayed.  Liver Function Tests:  Recent Labs Lab 09/30/15 0718 10/03/15 0212 10/05/15 0145  AST '17 23 19  '$ ALT 12* 10* 11*  ALKPHOS 22* 27* 24*  BILITOT 0.5 0.4 0.6  PROT 5.3* 5.7* 4.8*  ALBUMIN 2.9* 3.0* 2.9*    Coags:  Recent Labs Lab 10/02/15 0307 10/03/15 0212 10/04/15 0355 10/05/15 0145 10/06/15 0400  INR 2.99* 2.37* 1.83* 1.46 1.33   CBG:  Recent Labs Lab 10/05/15 0827 10/05/15 1231 10/05/15 1711 10/05/15 2141 10/06/15 0752  GLUCAP 168* 190* 157* 200* 158*    Recent Results (from the past 240 hour(s))  MRSA PCR Screening     Status: None   Collection Time: 09/30/15  5:14 PM  Result Value Ref Range Status   MRSA by PCR NEGATIVE NEGATIVE Final    Comment:        The GeneXpert MRSA Assay (FDA approved for NASAL specimens only), is one component of a comprehensive MRSA colonization surveillance program. It is not intended to diagnose MRSA infection nor to guide or monitor treatment for MRSA infections.     Studies:   Recent x-ray studies have been reviewed in detail by the Attending Physician  Scheduled Meds:  Scheduled Meds: . insulin aspart  0-5 Units Subcutaneous QHS  . insulin aspart  0-9 Units Subcutaneous TID WC  . insulin aspart  3 Units Subcutaneous TID WC  . isosorbide mononitrate  60 mg Oral Daily  . metoprolol succinate  25 mg Oral Daily  . omega-3 acid ethyl esters  1 g Oral BID  . pantoprazole  40 mg Oral BID  . pregabalin  50 mg Oral TID  . rosuvastatin  20 mg Oral q1800  . sucralfate  1 g Oral TID WC & HS    Time spent on care of this patient: 35 mins   Breean Nannini T , MD   [facility] Office  [phone] Pager - Text Page per Shea Evans as per below:  On-Call/Text Page:       [email or web address]      password [email or web address]  If 7PM-7AM, please contact night-coverage [email or web address] Password [email or web address] 10/06/2015, 10:06 AM   LOS: 6 days

## 2015-10-06 NOTE — Progress Notes (Addendum)
ANTICOAGULATION CONSULT NOTE  Pharmacy Consult:  Heparin Indication: AVR  No Known Allergies  Patient Measurements: Height: '5\' 10"'$  (177.8 cm) Weight: 212 lb 1.6 oz (96.208 kg) IBW/kg (Calculated) : 73   Vital Signs: Temp: 97.8 F (36.6 C) (02/17 0757) Temp Source: Oral (02/17 0757) BP: 140/54 mmHg (02/17 1001) Pulse Rate: 73 (02/17 1001)  Labs:  Recent Labs  10/04/15 0355  10/04/15 1450  10/05/15 0145 10/05/15 1023 10/05/15 1800 10/06/15 0400  HGB  --   < > 9.6*  --  8.6* 9.3*  --  8.5*  HCT  --   < > 29.9*  --  27.5* 30.4*  --  26.9*  PLT  --   --  174  --  142*  --   --  147*  LABPROT 21.1*  --   --   --  17.8*  --   --  16.6*  INR 1.83*  --   --   --  1.46  --   --  1.33  HEPARINUNFRC  --   --   --   < > 0.17* 0.35 0.43 0.50  CREATININE  --   --   --   --  0.99  --   --   --   TROPONINI  --   --  0.04*  --   --   --   --   --   < > = values in this interval not displayed.  Estimated Creatinine Clearance: 76.2 mL/min (by C-G formula based on Cr of 0.99).    Assessment: 40 YOM with history of St. Jude AVR on Coumadin PTA.  Currently on heparin infusion while Coumadin is on hold.  Heparin level is therapeutic; no bleeding reported.   Goal of Therapy:  Heparin level 0.3-0.5 units/ml Monitor platelets by anticoagulation protocol: Yes    Plan:  - Reduce heparin gtt slightly to 1450 units/hr - Daily HL / CBC - F/U with resuming Coumadin    Jaylynne Birkhead D. Mina Marble, PharmD, BCPS Pager:  [phone] 10/06/2015, 10:11 AM   =================================================   Addendum: - resume Coumadin, INR sub-therapeutic this AM  Goal of Therapy: INR 2 - 3   Plan: - Coumadin '5mg'$  PO today - Daily PT / INR    Johnn Krasowski D. Mina Marble, PharmD, BCPS Pager:  [phone] 10/06/2015, 10:58 AM

## 2015-10-06 NOTE — Progress Notes (Signed)
     16 M with HTN, DM II, OSA, carotid artery stenosis, CAD (s/p CABG 1991 with redo 1994, stent to RCA 2005, last PCI overlapping DES to SVG-RCA in 04/2014, nonischemic nuc 07/2015), h/o recurrent GIB/ABL anemia (with multiple admissions ruling in for NSTEMI simultaneously in 2016), PVD s/p bilateral LE intervention, frequent PVCs, chronic systolic CHF/ICM, s/p L BKA, and h/o AS s/p St Jude AVR presented with supratherapeutic INR and GI bleed.    S/p EGD on 12/15 --> now back on IV Heparin, but Hgb a bit down. Will need to restart Warfarin once stable & allow for Heparin bridge.  He has persistent NSR with PVCs in [place] pattern (reported as 30s - not accurate).  Cannot titrate BB any further.  NO truly active Cardiac concerns.  We will sign off for now & be available if needed.  Leonie Man, MD  Leonie Man, M.D., M.S. Interventional Cardiologist   Pager # [phone] Phone # [phone] [address] [place], [place] [address]

## 2015-10-06 NOTE — Consult Note (Signed)
   [facility] CM Inpatient Consult   10/06/2015  Tanner Pena 08-29-1940 [id number] Patient is currently active with [facility] for chronic disease management services.  Patient has been engaged by a [employer].  Our community based plan of care has focused on disease management and community resource support. Active consent for [facility] services on file.  Patient will receive a post discharge transition of care call and will be evaluated for monthly home visits for assessments and disease process education.   [facility] Care Management following for ongoing community care management.  No inpatient RNCM was currently available, at the time of visit to the unit.  Of note, [facility] Care Management services does not replace or interfere with any services that are needed or arranged by inpatient case management or social work.  For additional questions or referrals please contact: Natividad Brood, RN BSN [facility] Liaison  [phone] business mobile phone Toll free office [phone]

## 2015-10-07 LAB — PROTIME-INR
INR: 1.21 (ref 0.00–1.49)
PROTHROMBIN TIME: 15.4 s — AB (ref 11.6–15.2)

## 2015-10-07 LAB — GLUCOSE, CAPILLARY
GLUCOSE-CAPILLARY: 200 mg/dL — AB (ref 65–99)
GLUCOSE-CAPILLARY: 202 mg/dL — AB (ref 65–99)
Glucose-Capillary: 155 mg/dL — ABNORMAL HIGH (ref 65–99)
Glucose-Capillary: 139 mg/dL — ABNORMAL HIGH (ref 65–99)

## 2015-10-07 LAB — CBC
HEMATOCRIT: 30.1 % — AB (ref 39.0–52.0)
Hemoglobin: 9.3 g/dL — ABNORMAL LOW (ref 13.0–17.0)
MCH: 25.8 pg — ABNORMAL LOW (ref 26.0–34.0)
MCHC: 30.9 g/dL (ref 30.0–36.0)
MCV: 83.6 fL (ref 78.0–100.0)
PLATELETS: 172 10*3/uL (ref 150–400)
RBC: 3.6 MIL/uL — AB (ref 4.22–5.81)
RDW: 18.5 % — AB (ref 11.5–15.5)
WBC: 9.8 10*3/uL (ref 4.0–10.5)

## 2015-10-07 LAB — HEPARIN LEVEL (UNFRACTIONATED): Heparin Unfractionated: 0.49 IU/mL (ref 0.30–0.70)

## 2015-10-07 MED ORDER — WARFARIN SODIUM 5 MG PO TABS
5.0000 mg | ORAL_TABLET | Freq: Once | ORAL | Status: AC
  Administered 2015-10-07: 5 mg via ORAL
  Filled 2015-10-07: qty 1

## 2015-10-07 NOTE — Evaluation (Signed)
Physical Therapy Evaluation Patient Details Name: Tanner Pena MRN: [id number] DOB: 10/11/1940 Today's Date: 10/07/2015   History of Present Illness  Patient is a 75 yo male admitted 09/30/15 with UGIB.  PMH:  NSTEMI, CAD, CABG, GIB, gastric ulcers and AVM, aortic valve replacement, anemia, DM, PVD, s/p Lt BKA, COPD, CHF, OSA, HTN, CM with EF 25-30%  Clinical Impression  Patient is functioning at Mod I level for mobility/transfers.  Patient primarily uses electric w/c at home for mobility.  No further PT needs identified - will sign off.  Patient in agreement.      Follow Up Recommendations No PT follow up;Supervision - Intermittent    Equipment Recommendations  None recommended by PT    Recommendations for Other Services       Precautions / Restrictions Precautions Precautions: None Restrictions Weight Bearing Restrictions: No LLE Weight Bearing: Non weight bearing      Mobility  Bed Mobility Overal bed mobility: Independent                Transfers Overall transfer level: Modified independent Equipment used: None             General transfer comment: Able to transfer bed <> BSC with no assistance.  Increased time.  Patient declined OOB to chair.  States he sits up on EOB when he wants to sit.  Ambulation/Gait             General Gait Details: Patient uses Lt prosthesis for gait.  Does not have it here in hospital.  Did not test gait.  Stairs            Wheelchair Mobility    Modified Rankin (Stroke Patients Only)       Balance Overall balance assessment: No apparent balance deficits (not formally assessed) Sitting-balance support: No upper extremity supported;Feet supported Sitting balance-Leahy Scale: Normal                                       Pertinent Vitals/Pain Pain Assessment: No/denies pain (Reports he does still have phantom limb pain occasionally)    Home Living Family/patient expects to be discharged  to:: Private residence Living Arrangements: Other relatives (Nephew) Available Help at Discharge: Family;Available 24 hours/day Type of Home: House Home Access: Ramped entrance     Home Layout: One level Home Equipment: Walker - 2 wheels;Crutches;Shower seat;Hand held shower head;Wheelchair - Education officer, community - power      Prior Function Level of Independence: Independent with assistive device(s)         Comments: Uses power w/c primarily due to shoulder injuries.  Nephew assists on farm.     Hand Dominance        Extremity/Trunk Assessment   Upper Extremity Assessment: RUE deficits/detail;LUE deficits/detail RUE Deficits / Details: Decreased shoulder flexion to 90* due to shoulder injury.     LUE Deficits / Details: Decreased shoulder flexion to 90* due to shoulder injury.   Lower Extremity Assessment: Overall WFL for tasks assessed;LLE deficits/detail   LLE Deficits / Details: Lt BKA.  Patient with full knee extension.  Good strength  Cervical / Trunk Assessment: Normal  Communication   Communication: No difficulties  Cognition Arousal/Alertness: Awake/alert Behavior During Therapy: WFL for tasks assessed/performed Overall Cognitive Status: Within Functional Limits for tasks assessed  General Comments      Exercises Amputee Exercises Quad Sets: AROM;Left;10 reps;Seated      Assessment/Plan    PT Assessment Patent does not need any further PT services  PT Diagnosis Generalized weakness   PT Problem List    PT Treatment Interventions     PT Goals (Current goals can be found in the Care Plan section) Acute Rehab PT Goals PT Goal Formulation: All assessment and education complete, DC therapy    Frequency     Barriers to discharge        Co-evaluation               End of Session   Activity Tolerance: Patient tolerated treatment well Patient left: in bed;with call bell/phone within reach (Sitting EOB) Nurse  Communication: Mobility status         Time: 8250-5397 PT Time Calculation (min) (ACUTE ONLY): 13 min   Charges:   PT Evaluation $PT Eval Moderate Complexity: 1 Procedure     PT G CodesDespina Pole 10-30-15, 12:49 PM Carita Pian. Sanjuana Kava, [place] Pager [phone]

## 2015-10-07 NOTE — Progress Notes (Signed)
ANTICOAGULATION CONSULT NOTE  Pharmacy Consult:  Heparin and Warfarin Indication: AVR  No Known Allergies  Patient Measurements: Height: '5\' 10"'$  (177.8 cm) Weight: 198 lb 3.2 oz (89.903 kg) IBW/kg (Calculated) : 73   Vital Signs: Temp: 99 F (37.2 C) (02/18 0530) Temp Source: Oral (02/18 0530) BP: 146/34 mmHg (02/18 1014) Pulse Rate: 77 (02/18 1014)  Labs:  Recent Labs  10/04/15 1450 10/05/15 0145  10/05/15 1800 10/06/15 0400 10/06/15 1801 10/07/15 0319  HGB 9.6* 8.6*  < >  --  8.5* 9.0* 9.3*  HCT 29.9* 27.5*  < >  --  26.9* 29.2* 30.1*  PLT 174 142*  --   --  147* 164 172  LABPROT  --  17.8*  --   --  16.6*  --  15.4*  INR  --  1.46  --   --  1.33  --  1.21  HEPARINUNFRC  --  0.17*  < > 0.43 0.50  --  0.49  CREATININE  --  0.99  --   --   --   --   --   TROPONINI 0.04*  --   --   --   --   --   --   < > = values in this interval not displayed.  Estimated Creatinine Clearance: 73.9 mL/min (by C-G formula based on Cr of 0.99).    Assessment: 89 YOM with history of St. Jude AVR on Coumadin PTA.  Currently on heparin infusion while INR is subtherapeutic on warfarin.  Heparin level is therapeutic; no bleeding reported.  PTA Warfarin Dose: '5mg'$  Thur/Sun and 2.'5mg'$  AODs with last home dose on 2/9   Goal of Therapy:  INR 2-3 Heparin level 0.3-0.5 units/ml Monitor platelets by anticoagulation protocol: Yes    Plan:  Continue heparin 1450 units/hr Warfarin '5mg'$  tonight x1 Daily INR/HL/CBC Monitor s/sx of bleeding   Tanner Pena. Diona Foley, PharmD, [facility] Clinical Pharmacist Pager [phone]  10/07/2015, 10:21 AM

## 2015-10-07 NOTE — Progress Notes (Signed)
Tanner Pena XNA:[id number] DOB: 1940/12/19 DOA: 09/30/2015 PCP: [name], MD  Admit HPI / Brief Narrative: 75 y.o. 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  Admitted from [place] with melena  HPI/Subjective:  Alert pleasant oriented no apparent distress Tolerating diet Complains of some phantom pain in the stump but no other issues No smoking and wasn't able to tolerate breakfast but was able to eat subsequently after medicating  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 EGD 2/15 noted a healing 1cm greater curvature ulcer, a 74m antral ulcer, a 474mpyloric channel ulcer, and 3 AVMs which were treated w/ epi and APC - cont PPI daily indef - watch for re-bleeding w/ resumption of warfarin   SSCP w/ hx of CAD s/p CABG 1991 with redo 1994 Experienced SSCP after his endo - EKG in recovery notable for LBBB which is chronic, making acute usefulness limited - troponin was unrevealing x1 (ordered as 3 lab panel but not done for unclear reasons) - no more pain Cardiology was consulted and did not feel any other workup indicated  Acute blood loss anemia on 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 S/p 5U PRBC thus far - keep Hgb 8.0 or > - Hgb fluctuating, but not convinced this is due to blood loss - cont to follow in serial fashion  Hemoglobin has stabilized in the 9 range Continue Carafate 1 g 4 times a day as well  as Protonix 40 twice a day and will need to continue this as an outpatient  H/O aortic valve replacement (St Jude) Coumadin initially held due to GIB - have resumed IV heparin - resume warfarin 2/17- needs to remain hospitalized until INR at goal to assure does not re-bleed (high risk to do so) INR is 1.2  DM2  A1c 6.8 - CBG remains above goal - adjust tx and follow  CBG 127-200  Hyperlipidemia with target LDL less than 70 Continue statins  Cardiomyopathy, ischemic - EF 25-30% by echo 5/14 Well compensated presently  Continue ramipril 2.5 daily, metoprolol 25 daily, Imdur 60 daily  PVD s/p bilateral LE intervention, 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   Code Status: FULL Family Communication: no family present at time of exam Disposition Plan: stable for transfer to tele bed - PT/OT - resume warfarin and await therapeutic INR   [facility]  [facility]  Procedures: EGD - 2/15 - results noted above   Antibiotics: none  DVT prophylaxis: IV heparin > warfarin   Objective: Blood pressure 146/34, pulse 77, temperature 99 F (37.2 C), temperature source Oral, resp. rate 18, height '5\' 10"'$  (1.778 m), weight 89.903 kg (198 lb 3.2 oz), SpO2 99 %.  Intake/Output Summary (Last 24 hours) at 10/07/15 1241 Last data  filed at 10/06/15 2326  Gross per 24 hour  Intake  552.5 ml  Output   1000 ml  Net -447.5 ml   Exam: General: No acute respiratory distress- alert  Lungs: Clear to auscultation bilaterally - no wheezes or crackles Cardiovascular: Regular rate and rhythm without murmur gallop or rub Abdomen: Nontender, nondistended  Data Reviewed:  Basic Metabolic Panel:  Recent Labs Lab 10/01/15 0421 10/03/15 0212 10/05/15 0145  NA 145 141 139  K 4.5 4.1 4.0  CL 114* 108 107  CO2  '23 23 23  '$ GLUCOSE 177* 160* 204*  BUN 54* 27* 13  CREATININE 1.42* 1.06 0.99  CALCIUM 8.6* 9.2 9.1    CBC:  Recent Labs Lab 10/04/15 1450 10/05/15 0145 10/05/15 1023 10/06/15 0400 10/06/15 1801 10/07/15 0319  WBC 7.3 9.2  --  6.8 7.7 9.8  HGB 9.6* 8.6* 9.3* 8.5* 9.0* 9.3*  HCT 29.9* 27.5* 30.4* 26.9* 29.2* 30.1*  MCV 83.5 82.8  --  84.3 84.4 83.6  PLT 174 142*  --  147* 164 172    Liver Function Tests:  Recent Labs Lab 10/03/15 0212 10/05/15 0145  AST 23 19  ALT 10* 11*  ALKPHOS 27* 24*  BILITOT 0.4 0.6  PROT 5.7* 4.8*  ALBUMIN 3.0* 2.9*    Coags:  Recent Labs Lab 10/03/15 0212 10/04/15 0355 10/05/15 0145 10/06/15 0400 10/07/15 0319  INR 2.37* 1.83* 1.46 1.33 1.21   CBG:  Recent Labs Lab 10/06/15 1317 10/06/15 1752 10/06/15 2042 10/07/15 0653 10/07/15 1139  GLUCAP 182* 132* 127* 200* 155*    Recent Results (from the past 240 hour(s))  MRSA PCR Screening     Status: None   Collection Time: 09/30/15  5:14 PM  Result Value Ref Range Status   MRSA by PCR NEGATIVE NEGATIVE Final    Comment:        The GeneXpert MRSA Assay (FDA approved for NASAL specimens only), is one component of a comprehensive MRSA colonization surveillance program. It is not intended to diagnose MRSA infection nor to guide or monitor treatment for MRSA infections.     Studies:   Recent x-ray studies have been reviewed in detail by the Attending Physician  Scheduled Meds:  Scheduled Meds: . glimepiride  1 mg Oral BID  . insulin aspart  0-5 Units Subcutaneous QHS  . insulin aspart  0-9 Units Subcutaneous TID WC  . isosorbide mononitrate  60 mg Oral Daily  . metoprolol succinate  25 mg Oral Daily  . omega-3 acid ethyl esters  1 g Oral BID  . pantoprazole  40 mg Oral BID  . pregabalin  50 mg Oral TID  . ramipril  2.5 mg Oral Daily  . rosuvastatin  20 mg Oral q1800  . sucralfate  1 g Oral TID WC & HS  . warfarin  5 mg Oral ONCE-1800  . Warfarin - Pharmacist  Dosing Inpatient   Does not apply q1800    Time spent on care of this patient: 35 mins  Verneita Griffes, MD [facility] [phone]

## 2015-10-08 LAB — CBC
HCT: 27 % — ABNORMAL LOW (ref 39.0–52.0)
Hemoglobin: 8.3 g/dL — ABNORMAL LOW (ref 13.0–17.0)
MCH: 25.7 pg — AB (ref 26.0–34.0)
MCHC: 30.7 g/dL (ref 30.0–36.0)
MCV: 83.6 fL (ref 78.0–100.0)
PLATELETS: 194 10*3/uL (ref 150–400)
RBC: 3.23 MIL/uL — ABNORMAL LOW (ref 4.22–5.81)
RDW: 18.6 % — AB (ref 11.5–15.5)
WBC: 7.8 10*3/uL (ref 4.0–10.5)

## 2015-10-08 LAB — BASIC METABOLIC PANEL
Anion gap: 8 (ref 5–15)
BUN: 19 mg/dL (ref 6–20)
CALCIUM: 9 mg/dL (ref 8.9–10.3)
CHLORIDE: 109 mmol/L (ref 101–111)
CO2: 25 mmol/L (ref 22–32)
CREATININE: 1.03 mg/dL (ref 0.61–1.24)
GFR calc Af Amer: >60 mL/min (ref >60)
Glucose, Bld: 144 mg/dL — ABNORMAL HIGH (ref 65–99)
Potassium: 3.7 mmol/L (ref 3.5–5.1)
SODIUM: 142 mmol/L (ref 135–145)

## 2015-10-08 LAB — GLUCOSE, CAPILLARY
GLUCOSE-CAPILLARY: 129 mg/dL — AB (ref 65–99)
Glucose-Capillary: 127 mg/dL — ABNORMAL HIGH (ref 65–99)
Glucose-Capillary: 164 mg/dL — ABNORMAL HIGH (ref 65–99)

## 2015-10-08 LAB — PROTIME-INR
INR: 1.26 (ref 0.00–1.49)
PROTHROMBIN TIME: 16 s — AB (ref 11.6–15.2)

## 2015-10-08 LAB — HEPARIN LEVEL (UNFRACTIONATED)
Heparin Unfractionated: 0.27 IU/mL — ABNORMAL LOW (ref 0.30–0.70)
Heparin Unfractionated: 0.4 IU/mL (ref 0.30–0.70)

## 2015-10-08 MED ORDER — WARFARIN SODIUM 5 MG PO TABS
5.0000 mg | ORAL_TABLET | Freq: Once | ORAL | Status: AC
  Administered 2015-10-08: 5 mg via ORAL
  Filled 2015-10-08: qty 1

## 2015-10-08 MED ORDER — HEPARIN (PORCINE) IN NACL 100-0.45 UNIT/ML-% IJ SOLN
1600.0000 [IU]/h | INTRAMUSCULAR | Status: DC
  Administered 2015-10-09 – 2015-10-14 (×9): 1600 [IU]/h via INTRAVENOUS
  Filled 2015-10-08 (×9): qty 250

## 2015-10-08 MED ORDER — FUROSEMIDE 20 MG PO TABS
20.0000 mg | ORAL_TABLET | Freq: Every day | ORAL | Status: DC
  Administered 2015-10-08 – 2015-10-14 (×7): 20 mg via ORAL
  Filled 2015-10-08 (×7): qty 1

## 2015-10-08 NOTE — Progress Notes (Signed)
OT Cancellation Note and Discharge  Patient Details Name: Tanner Pena MRN: [id number] DOB: [birth year]   Cancelled Treatment:    Reason Eval/Treat Not Completed: OT screened, no needs identified, will sign off. Pt Mod Independent with transfers with PT yesterday, spoke to pt today and he does not feel he will have any issues with B/D/toileting once he is D/c'd. He B/D himself today and has been getting up to [facility] by himself. Acute OT will sign off  Almon Register [phone] 10/08/2015, 3:07 PM

## 2015-10-08 NOTE — Progress Notes (Signed)
Tanner Pena OAC:[id number] DOB: July 09, 1941 DOA: 09/30/2015 PCP: [name], MD  Admit HPI / Brief Narrative: 75 y.o. 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 EGD 2/15 noted a healing 1cm greater curvature ulcer, a 35m antral ulcer, a 464mpyloric channel ulcer, and 3 AVMs which were treated w/ epi and APC - cont PPI daily indef - watch for re-bleeding w/ resumption of warfarin  Expect will be a slow process to ge there therapeutic on INR  SSCP w/ hx of CAD s/p CABG 1991 with redo 1994 Experienced SSCP after his endo - EKG in recovery notable for LBBB which is chronic, making acute usefulness limited - troponin was unrevealing x1 (ordered as 3 lab panel but not done for unclear reasons) - no more pain Cardiology was consulted and did not feel any other workup indicated  Acute blood loss anemia on 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  See #1  DM2  A1c 6.8 - CBG remains above goal - adjust tx and follow  CBG 127-144 range   Hyperlipidemia with target LDL less than 70 Continue statins  Cardiomyopathy, 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 improved  Code Status: FULL Family Communication: no family present at time of exam Disposition Plan: stable for transfer to tele bed - PT/OT - resume warfarin and await therapeutic INR   [facility]  [facility]  Procedures: EGD - 2/15 - results noted above   Antibiotics: none  DVT prophylaxis: IV heparin > warfarin   Objective: Blood pressure 130/53, pulse 61, temperature 99.3 F (37.4 C), temperature source Oral, resp. rate 18, height '5\' 10"'$  (1.778 m), weight 94.3 kg (207 lb 14.3 oz), SpO2 96 %.  Intake/Output Summary (Last 24 hours) at 10/08/15 0919 Last data filed at 10/08/15 0416  Gross per 24 hour  Intake      0 ml  Output    650 ml  Net   -650 ml   Exam: General: No acute respiratory distress- alert  Lungs: Clear to auscultation bilaterally - mild crackles L post lung fields Cardiovascular: Regular rate and rhythm without murmur gallop or rub Abdomen: Nontender, nondistended  Data Reviewed:  Basic Metabolic Panel:  Recent Labs Lab 10/03/15 0212 10/05/15 0145 10/08/15 0232  NA 141  139 142  K 4.1 4.0 3.7  CL 108 107 109  CO2 '23 23 25  '$ GLUCOSE 160* 204* 144*  BUN 27* 13 19  CREATININE 1.06 0.99 1.03  CALCIUM 9.2 9.1 9.0    CBC:  Recent Labs Lab 10/05/15 0145 10/05/15 1023 10/06/15 0400 10/06/15 1801 10/07/15 0319 10/08/15 0232  WBC 9.2  --  6.8 7.7 9.8 7.8  HGB 8.6* 9.3* 8.5* 9.0* 9.3* 8.3*  HCT 27.5* 30.4* 26.9* 29.2* 30.1* 27.0*  MCV 82.8  --  84.3 84.4 83.6 83.6  PLT 142*  --  147* 164 172 194    Liver Function Tests:  Recent Labs Lab 10/03/15 0212 10/05/15 0145  AST 23 19  ALT 10* 11*  ALKPHOS 27* 24*  BILITOT 0.4 0.6  PROT 5.7* 4.8*  ALBUMIN 3.0* 2.9*    Coags:  Recent Labs Lab 10/04/15 0355 10/05/15 0145 10/06/15 0400 10/07/15 0319 10/08/15 0232  INR 1.83* 1.46 1.33 1.21 1.26   CBG:  Recent Labs Lab 10/07/15 0653 10/07/15 1139 10/07/15 1619 10/07/15 2239 10/08/15 0621  GLUCAP 200* 155* 139* 202* 127*    Recent Results (from the past 240 hour(s))  MRSA PCR Screening     Status: None   Collection Time: 09/30/15  5:14 PM  Result Value Ref Range Status   MRSA by PCR NEGATIVE NEGATIVE Final    Comment:        The GeneXpert MRSA Assay (FDA approved for NASAL specimens only), is one component of a comprehensive MRSA colonization surveillance program. It is not intended to diagnose MRSA infection nor to guide or monitor treatment for MRSA infections.     Studies:   Recent x-ray studies have been reviewed in detail by the Attending Physician  Scheduled Meds:  Scheduled Meds: . furosemide  20 mg Oral Daily  . glimepiride  1 mg Oral BID  . insulin aspart  0-5 Units Subcutaneous QHS  . insulin aspart  0-9 Units Subcutaneous TID WC  . isosorbide mononitrate  60 mg Oral Daily  . metoprolol succinate  25 mg Oral Daily  . omega-3 acid ethyl esters  1 g Oral BID  . pantoprazole  40 mg Oral BID  . pregabalin  50 mg Oral TID  . ramipril  2.5 mg Oral Daily  . rosuvastatin  20 mg Oral q1800  . sucralfate  1 g  Oral TID WC & HS  . Warfarin - Pharmacist Dosing Inpatient   Does not apply q1800    Time spent on care of this patient: 35 mins No family +  Verneita Griffes, MD [facility] [phone]

## 2015-10-08 NOTE — Progress Notes (Signed)
ANTICOAGULATION CONSULT NOTE  Pharmacy Consult:  Heparin and Warfarin Indication: h/o  AVR  No Known Allergies  Patient Measurements: Height: '5\' 10"'$  (177.8 cm) Weight: 207 lb 14.3 oz (94.3 kg) IBW/kg (Calculated) : 73 kg Heparin dosing weight = 92.1 kg  Vital Signs: Temp: 97.6 F (36.4 C) (02/19 1451) Temp Source: Oral (02/19 1451) BP: 120/43 mmHg (02/19 1451) Pulse Rate: 53 (02/19 1451)  Labs:  Recent Labs  10/06/15 0400 10/06/15 1801 10/07/15 0319 10/08/15 0232 10/08/15 1833  HGB 8.5* 9.0* 9.3* 8.3*  --   HCT 26.9* 29.2* 30.1* 27.0*  --   PLT 147* 164 172 194  --   LABPROT 16.6*  --  15.4* 16.0*  --   INR 1.33  --  1.21 1.26  --   HEPARINUNFRC 0.50  --  0.49 0.27* 0.40  CREATININE  --   --   --  1.03  --     Estimated Creatinine Clearance: 72.5 mL/min (by C-G formula based on Cr of 1.03).    Assessment: 83 YOM with history of St. Jude AVR on Coumadin PTA.  Heparin level 0.4 on heparin 1600 units/hr, therapeutic. No bleeding noted.   Coumadin resumed on 2/17.  hgb 8.5>9.0>9.3>8.3, pltc 194K  PTA Warfarin Dose: '5mg'$  Thur/Sun and 2.'5mg'$  AODs with last home dose on 2/9   Goal of Therapy:  INR 2-3 Heparin level 0.3-0.5 units/ml Monitor platelets by anticoagulation protocol: Yes    Plan:  Continue IV heparin to 1600 units/hr Check  heparin level in 6hr to confirm Daily INR/HL/CBC Monitor s/sx of bleeding  Thank you for allowing pharmacy to be part of this patients care team.  Nicole Cella, RPh Clinical Pharmacist Pager: [phone] 10/08/2015, 8:17 PM

## 2015-10-08 NOTE — Progress Notes (Addendum)
ANTICOAGULATION CONSULT NOTE  Pharmacy Consult:  Heparin and Warfarin Indication: h/o  AVR  No Known Allergies  Patient Measurements: Height: '5\' 10"'$  (177.8 cm) Weight: 207 lb 14.3 oz (94.3 kg) IBW/kg (Calculated) : 73 kg Heparin dosing weight = 92.1 kg  Vital Signs: Temp: 99.3 F (37.4 C) (02/19 0413) Temp Source: Oral (02/19 0413) BP: 151/45 mmHg (02/19 0950) Pulse Rate: 61 (02/19 0413)  Labs:  Recent Labs  10/06/15 0400 10/06/15 1801 10/07/15 0319 10/08/15 0232  HGB 8.5* 9.0* 9.3* 8.3*  HCT 26.9* 29.2* 30.1* 27.0*  PLT 147* 164 172 194  LABPROT 16.6*  --  15.4* 16.0*  INR 1.33  --  1.21 1.26  HEPARINUNFRC 0.50  --  0.49 0.27*  CREATININE  --   --   --  1.03    Estimated Creatinine Clearance: 72.5 mL/min (by C-G formula based on Cr of 1.03).    Assessment: 52 YOM with history of St. Jude AVR on Coumadin PTA.  Currently on heparin infusion 1450 units/hr, Heparin level this AM decreased to 0.27 , subtherapeutic as goal is 0.3-0.7.  RN reports IV heparin drip infusing during night/AM without complications noted and no bleeding reported. INR is 1.26, SUBtherapeutic.  Coumadin resumed on 2/17.  hgb 8.5>9.0>9.3>8.3, pltc 194K  PTA Warfarin Dose: '5mg'$  Thur/Sun and 2.'5mg'$  AODs with last home dose on 2/9   Goal of Therapy:  INR 2-3 Heparin level 0.3-0.5 units/ml Monitor platelets by anticoagulation protocol: Yes    Plan:  Increase IV heparin to 1600 units/hr Check 6 hr heparin level Warfarin '5mg'$  tonight x1 Daily INR/HL/CBC Monitor s/sx of bleeding   Nicole Cella, RPh Clinical Pharmacist Pager: [phone]   10/08/2015, 12:01 PM

## 2015-10-09 LAB — CBC
HEMATOCRIT: 25.7 % — AB (ref 39.0–52.0)
HEMOGLOBIN: 7.9 g/dL — AB (ref 13.0–17.0)
MCH: 25.9 pg — AB (ref 26.0–34.0)
MCHC: 30.7 g/dL (ref 30.0–36.0)
MCV: 84.3 fL (ref 78.0–100.0)
Platelets: 206 10*3/uL (ref 150–400)
RBC: 3.05 MIL/uL — AB (ref 4.22–5.81)
RDW: 18.7 % — ABNORMAL HIGH (ref 11.5–15.5)
WBC: 6.8 10*3/uL (ref 4.0–10.5)

## 2015-10-09 LAB — HEPARIN LEVEL (UNFRACTIONATED)
HEPARIN UNFRACTIONATED: 0.45 [IU]/mL (ref 0.30–0.70)
Heparin Unfractionated: 0.35 IU/mL (ref 0.30–0.70)

## 2015-10-09 LAB — GLUCOSE, CAPILLARY
GLUCOSE-CAPILLARY: 130 mg/dL — AB (ref 65–99)
GLUCOSE-CAPILLARY: 148 mg/dL — AB (ref 65–99)
GLUCOSE-CAPILLARY: 172 mg/dL — AB (ref 65–99)
GLUCOSE-CAPILLARY: 122 mg/dL — AB (ref 65–99)
GLUCOSE-CAPILLARY: 137 mg/dL — AB (ref 65–99)

## 2015-10-09 LAB — PROTIME-INR
INR: 1.58 — ABNORMAL HIGH (ref 0.00–1.49)
Prothrombin Time: 18.9 seconds — ABNORMAL HIGH (ref 11.6–15.2)

## 2015-10-09 MED ORDER — WARFARIN SODIUM 5 MG PO TABS
5.0000 mg | ORAL_TABLET | Freq: Once | ORAL | Status: AC
  Administered 2015-10-09: 5 mg via ORAL
  Filled 2015-10-09: qty 1

## 2015-10-09 NOTE — Progress Notes (Addendum)
ANTICOAGULATION CONSULT NOTE  Pharmacy Consult:  Heparin and Warfarin Indication: h/o  AVR  No Known Allergies  Patient Measurements: Height: '5\' 10"'$  (177.8 cm) Weight: 192 lb (87.091 kg) IBW/kg (Calculated) : 73 kg Heparin dosing weight = 92.1 kg  Vital Signs: Temp: 98.7 F (37.1 C) (02/20 0428) Temp Source: Oral (02/20 0428) BP: 107/38 mmHg (02/20 0428) Pulse Rate: 53 (02/20 0428)  Labs:  Recent Labs  10/07/15 0319 10/08/15 0232 10/08/15 1833 10/08/15 2342 10/09/15 0240  HGB 9.3* 8.3*  --   --  7.9*  HCT 30.1* 27.0*  --   --  25.7*  PLT 172 194  --   --  206  LABPROT 15.4* 16.0*  --   --  18.9*  INR 1.21 1.26  --   --  1.58*  HEPARINUNFRC 0.49 0.27* 0.40 0.45 0.35  CREATININE  --  1.03  --   --   --     Estimated Creatinine Clearance: 65 mL/min (by C-G formula based on Cr of 1.03).    Assessment: 35 YOM on Warfarin PTA for St.Jude AVR. Cards held Warfarin on admit due to GIB with bleeding risk higher than stroke risk. Warfarin resumed 2/17. Now on heparin bridge. HL remains therapeutic (0.35) on 1600 uits/h, INR is 1.26>>1.58, SUBtherapeutic. Hgb 7.9 (down), pltc wnl. S/p 5u PRBC this admit. Monitoring for bleed symptoms with warfarin restart.  PTA Warfarin Dose: '5mg'$  Thur/Sun and 2.'5mg'$  AODs with last home dose on 2/9  Goal of Therapy:  INR goal 2-3  Monitor platelets by anticoagulation protocol: Yes    Plan:  Continue heparin at 1600 units/h Warfarin '5mg'$  x 1 dose tonight Daily INR/HL/CBC Monitor s/sx of bleeding  Elicia Lamp, PharmD, [facility] Clinical Pharmacist Pager [phone] 10/09/2015 9:57 AM

## 2015-10-09 NOTE — Progress Notes (Signed)
Tanner Pena QPY:[id number] DOB: 1940-12-10 DOA: 09/30/2015 PCP: [name], MD  Admit HPI / Brief Narrative: 75 y.o. 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  Admitted from [facility] with melena Transfused 5 units of blood  HPI/Subjective:  Doing good INR 1.58 Patient wants to go home but understands the need to stay No other problems  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 EGD 2/15 noted a healing 1cm greater curvature ulcer, a 50m antral ulcer, a 485mpyloric channel ulcer, and 3 AVMs which were treated w/ epi and APC - cont PPI daily indef - watch for re-bleeding w/ resumption of warfarin  Expect will be a slow process to ge there therapeutic on INR Hemoglobin has steadily trended downwards from 9-7.9 over the past 3 days however patient has had no stool and has no nausea vomiting and is eating fairly so I feel feel this component is mainly iatrogenic and secondary to blood draws  SSCP w/ hx of CAD s/p CABG 1991 with redo 1994 Experienced SSCP after his endo - EKG in recovery notable for LBBB which is chronic, making acute usefulness limited - troponin was unrevealing x1 (ordered as 3 lab panel but not done for unclear reasons) - no more pain Cardiology was consulted and did not feel any other workup indicated He has not had any further chest pain  Acute blood loss anemia on chronic anemia secondary to  gastric ulcer S/p 5U PRBC thus far - keep Hgb 8.0 or > - Hgb fluctuating,  Hemoglobin has stabilized in the 8- 9 range Continue Carafate 1 g 4 times a day as well as Protonix 40 twice a day and will need to continue this as an outpatient  H/O aortic valve replacement (St Jude) Coumadin initially held due to GIB - have resumed IV heparin - resume warfarin 2/17- needs to remain hospitalized until INR at goal to assure does not re-bleed (high risk to do so) INR is  1.58 up from 1.2  See #1  DM2  A1c 6.8 - CBG remains above goal - adjust tx and follow  CBG1:30 to 160 range  range   Hyperlipidemia with target LDL less than 70 Continue statins  Cardiomyopathy, 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 Improved See above discussion   Code Status: FULL Family Communication: no family present at time of exam Disposition Plan: stable for transfer to tele bed - PT/OT - resume warfarin and await therapeutic INR   [facility]   [facility]  Procedures: EGD -  2/15 - results noted above   Antibiotics: none  DVT prophylaxis: IV heparin > warfarin   Objective: Blood pressure 107/38, pulse 53, temperature 98.7 F (37.1 C), temperature source Oral, resp. rate 18, height '5\' 10"'$  (1.778 m), weight 87.091 kg (192 lb), SpO2 100 %.  Intake/Output Summary (Last 24 hours) at 10/09/15 1032 Last data filed at 10/09/15 0800  Gross per 24 hour  Intake    720 ml  Output   2650 ml  Net  -1930 ml   Exam: General: No acute respiratory distress- alert  Lungs: Clear to auscultation  bilaterally - mild crackles L post lung fields Cardiovascular: Regular rate and rhythm without murmur gallop or rub Abdomen: Nontender, nondistended  Data Reviewed:  Basic Metabolic Panel:  Recent Labs Lab 10/03/15 0212 10/05/15 0145 10/08/15 0232  NA 141 139 142  K 4.1 4.0 3.7  CL 108 107 109  CO2 '23 23 25  '$ GLUCOSE 160* 204* 144*  BUN 27* 13 19  CREATININE 1.06 0.99 1.03  CALCIUM 9.2 9.1 9.0    CBC:  Recent Labs Lab 10/06/15 0400 10/06/15 1801 10/07/15 0319 10/08/15 0232 10/09/15 0240  WBC 6.8 7.7 9.8 7.8 6.8  HGB 8.5* 9.0* 9.3* 8.3* 7.9*  HCT 26.9* 29.2* 30.1* 27.0* 25.7*  MCV 84.3 84.4 83.6 83.6 84.3  PLT 147* 164 172 194 206    Liver Function Tests:  Recent Labs Lab 10/03/15 0212 10/05/15 0145  AST 23 19  ALT 10* 11*  ALKPHOS 27* 24*  BILITOT 0.4 0.6  PROT 5.7* 4.8*  ALBUMIN 3.0* 2.9*    Coags:  Recent Labs Lab 10/05/15 0145 10/06/15 0400 10/07/15 0319 10/08/15 0232 10/09/15 0240  INR 1.46 1.33 1.21 1.26 1.58*   CBG:  Recent Labs Lab 10/08/15 0621 10/08/15 1108 10/08/15 1620 10/08/15 2127 10/09/15 0614  GLUCAP 127* 164* 129* 130* 148*    Recent Results (from the past 240 hour(s))  MRSA PCR Screening     Status: None   Collection Time: 09/30/15  5:14 PM  Result Value Ref Range Status   MRSA by PCR NEGATIVE NEGATIVE Final    Comment:        The GeneXpert MRSA Assay (FDA approved for NASAL specimens only), is one component of a comprehensive MRSA colonization surveillance program. It is not intended to diagnose MRSA infection nor to guide or monitor treatment for MRSA infections.     Studies:   Recent x-ray studies have been reviewed in detail by the Attending Physician  Scheduled Meds:  Scheduled Meds: . furosemide  20 mg Oral Daily  . glimepiride  1 mg Oral BID  . insulin aspart  0-5 Units Subcutaneous QHS  . insulin aspart  0-9 Units Subcutaneous TID WC  . isosorbide mononitrate  60 mg Oral Daily  .  metoprolol succinate  25 mg Oral Daily  . omega-3 acid ethyl esters  1 g Oral BID  . pantoprazole  40 mg Oral BID  . pregabalin  50 mg Oral TID  . ramipril  2.5 mg Oral Daily  . rosuvastatin  20 mg Oral q1800  . sucralfate  1 g Oral TID WC & HS  . warfarin  5 mg Oral ONCE-1800  . Warfarin - Pharmacist Dosing Inpatient   Does not apply q1800    Time spent on care of this patient: 35 mins No family +  Verneita Griffes, MD [facility] [phone]

## 2015-10-09 NOTE — Care Management Important Message (Signed)
Important Message  Patient Details  Name: Tanner Pena MRN: [id number] Date of Birth: [birth year]   Medicare Important Message Given:  Yes    Loann Quill 10/09/2015, 11:07 AM

## 2015-10-09 NOTE — Progress Notes (Signed)
Nutrition Brief Note  Patient identified on the Malnutrition Screening Tool (MST) Report.  Wt Readings from Last 15 Encounters:  10/09/15 192 lb (87.091 kg)  08/23/15 212 lb (96.163 kg)  08/07/15 212 lb (96.163 kg)  08/03/15 212 lb (96.163 kg)  07/28/15 210 lb (95.255 kg)  07/27/15 212 lb 1.6 oz (96.208 kg)  07/19/15 209 lb (94.802 kg)  07/06/15 199 lb 4.7 oz (90.4 kg)  06/23/15 202 lb 6.1 oz (91.8 kg)  06/08/15 208 lb (94.348 kg)  06/08/15 208 lb (94.348 kg)  06/06/15 197 lb (89.359 kg)  06/01/15 201 lb (91.173 kg)  05/30/15 214 lb (97.07 kg)  05/18/15 214 lb (97.07 kg)     Body mass index is 27.55 kg/(m^2). Patient meets criteria for Normal based on current BMI.   Current diet order is Heart Healthy/Carbohydrate Modified, patient is consuming approximately 85% of meals at this time. Labs and medications reviewed.   No nutrition interventions warranted at this time. If nutrition issues arise, please consult RD.   Arthur Holms, RD, LDN Pager #: [phone] After-Hours Pager #: [phone]

## 2015-10-09 NOTE — Progress Notes (Signed)
Utilization review completed.  

## 2015-10-09 NOTE — Progress Notes (Signed)
ANTICOAGULATION CONSULT NOTE  Pharmacy Consult:  Heparin and Warfarin Indication: h/o  AVR  No Known Allergies  Patient Measurements: Height: '5\' 10"'$  (177.8 cm) Weight: 207 lb 14.3 oz (94.3 kg) IBW/kg (Calculated) : 73 kg Heparin dosing weight = 92.1 kg  Vital Signs: Temp: 98.8 F (37.1 C) (02/19 2129) Temp Source: Oral (02/19 2129) BP: 131/51 mmHg (02/19 2129) Pulse Rate: 60 (02/19 2129)  Labs:  Recent Labs  10/06/15 0400 10/06/15 1801 10/07/15 0319 10/08/15 0232 10/08/15 1833 10/08/15 2342  HGB 8.5* 9.0* 9.3* 8.3*  --   --   HCT 26.9* 29.2* 30.1* 27.0*  --   --   PLT 147* 164 172 194  --   --   LABPROT 16.6*  --  15.4* 16.0*  --   --   INR 1.33  --  1.21 1.26  --   --   HEPARINUNFRC 0.50  --  0.49 0.27* 0.40 0.45  CREATININE  --   --   --  1.03  --   --     Estimated Creatinine Clearance: 72.5 mL/min (by C-G formula based on Cr of 1.03).    Assessment: 77 YOM with history of St. Jude AVR on Coumadin PTA.  Heparin level 0.4 on heparin 1600 units/hr, therapeutic. No bleeding noted.   Coumadin resumed on 2/17.  hgb 8.5>9.0>9.3>8.3, pltc 194K  PTA Warfarin Dose: '5mg'$  Thur/Sun and 2.'5mg'$  AODs with last home dose on 2/9  Goal of Therapy:  INR 2-3 Heparin level 0.3-0.5 units/ml Monitor platelets by anticoagulation protocol: Yes  Plan:  Continue heparin 1600 units/hr Daily INR/HL/CBC Monitor s/sx of bleeding   Tanner Pena. Diona Foley, PharmD, [facility] Clinical Pharmacist Pager [phone] 10/09/2015, 12:05 AM

## 2015-10-09 NOTE — Discharge Instructions (Signed)
Information on my medicine - Coumadin   (Warfarin)  This medication education was reviewed with me or my healthcare representative as part of my discharge preparation.  The pharmacist that spoke with me during my hospital stay was:  Romona Curls, [facility]  Why was Coumadin prescribed for you? Coumadin was prescribed for you because you have a blood clot or a medical condition that can cause an increased risk of forming blood clots. Blood clots can cause serious health problems by blocking the flow of blood to the heart, lung, or brain. Coumadin can prevent harmful blood clots from forming. As a reminder your indication for Coumadin is:   Blood Clot Prevention After Heart Valve Surgery  What test will check on my response to Coumadin? While on Coumadin (warfarin) you will need to have an INR test regularly to ensure that your dose is keeping you in the desired range. The INR (international normalized ratio) number is calculated from the result of the laboratory test called prothrombin time (PT).  If an INR APPOINTMENT HAS NOT ALREADY BEEN MADE FOR YOU please schedule an appointment to have this lab work done by your health care provider within 7 days. Your INR goal is usually a number between:  2 to 3 or your provider may give you a more narrow range like 2-2.5.  Ask your health care provider during an office visit what your goal INR is.  What  do you need to  know  About  COUMADIN? Take Coumadin (warfarin) exactly as prescribed by your healthcare provider about the same time each day.  DO NOT stop taking without talking to the doctor who prescribed the medication.  Stopping without other blood clot prevention medication to take the place of Coumadin may increase your risk of developing a new clot or stroke.  Get refills before you run out.  What do you do if you miss a dose? If you miss a dose, take it as soon as you remember on the same day then continue your regularly scheduled regimen the next  day.  Do not take two doses of Coumadin at the same time.  Important Safety Information A possible side effect of Coumadin (Warfarin) is an increased risk of bleeding. You should call your healthcare provider right away if you experience any of the following: ? Bleeding from an injury or your nose that does not stop. ? Unusual colored urine (red or dark brown) or unusual colored stools (red or black). ? Unusual bruising for unknown reasons. ? A serious fall or if you hit your head (even if there is no bleeding).  Some foods or medicines interact with Coumadin (warfarin) and might alter your response to warfarin. To help avoid this: ? Eat a balanced diet, maintaining a consistent amount of Vitamin K. ? Notify your provider about major diet changes you plan to make. ? Avoid alcohol or limit your intake to 1 drink for women and 2 drinks for men per day. (1 drink is 5 oz. wine, 12 oz. beer, or 1.5 oz. liquor.)  Make sure that ANY health care provider who prescribes medication for you knows that you are taking Coumadin (warfarin).  Also make sure the healthcare provider who is monitoring your Coumadin knows when you have started a new medication including herbals and non-prescription products.  Coumadin (Warfarin)  Major Drug Interactions  Increased Warfarin Effect Decreased Warfarin Effect  Alcohol (large quantities) Antibiotics (esp. Septra/Bactrim, Flagyl, Cipro) Amiodarone (Cordarone) Aspirin (ASA) Cimetidine (Tagamet) Megestrol (Megace) NSAIDs (  ibuprofen, naproxen, etc.) °Piroxicam (Feldene) °Propafenone (Rythmol SR) °Propranolol (Inderal) °Isoniazid (INH) °Posaconazole (Noxafil) Barbiturates (Phenobarbital) °Carbamazepine (Tegretol) °Chlordiazepoxide (Librium) °Cholestyramine (Questran) °Griseofulvin °Oral Contraceptives °Rifampin °Sucralfate (Carafate) °Vitamin K  ° °Coumadin® (Warfarin) Major Herbal Interactions  °Increased Warfarin Effect Decreased Warfarin Effect   °Garlic °Ginseng °Ginkgo biloba Coenzyme Q10 °Green tea °St. John’s wort   ° °Coumadin® (Warfarin) FOOD Interactions  °Eat a consistent number of servings per week of foods HIGH in Vitamin K °(1 serving = ½ cup)  °Collards (cooked, or boiled & drained) °Kale (cooked, or boiled & drained) °Mustard greens (cooked, or boiled & drained) °Parsley *serving size only = ¼ cup °Spinach (cooked, or boiled & drained) °Swiss chard (cooked, or boiled & drained) °Turnip greens (cooked, or boiled & drained)  °Eat a consistent number of servings per week of foods MEDIUM-HIGH in Vitamin K °(1 serving = 1 cup)  °Asparagus (cooked, or boiled & drained) °Broccoli (cooked, boiled & drained, or raw & chopped) °Brussel sprouts (cooked, or boiled & drained) *serving size only = ½ cup °Lettuce, raw (green leaf, endive, romaine) °Spinach, raw °Turnip greens, raw & chopped  ° °These websites have more information on Coumadin (warfarin):  www.coumadin.com; °www.ahrq.gov/consumer/coumadin.htm; ° ° ° °

## 2015-10-10 LAB — CBC
HCT: 25.1 % — ABNORMAL LOW (ref 39.0–52.0)
HEMOGLOBIN: 7.7 g/dL — AB (ref 13.0–17.0)
MCH: 25.7 pg — ABNORMAL LOW (ref 26.0–34.0)
MCHC: 30.7 g/dL (ref 30.0–36.0)
MCV: 83.7 fL (ref 78.0–100.0)
Platelets: 223 10*3/uL (ref 150–400)
RBC: 3 MIL/uL — AB (ref 4.22–5.81)
RDW: 18.9 % — ABNORMAL HIGH (ref 11.5–15.5)
WBC: 6.4 10*3/uL (ref 4.0–10.5)

## 2015-10-10 LAB — GLUCOSE, CAPILLARY
GLUCOSE-CAPILLARY: 156 mg/dL — AB (ref 65–99)
GLUCOSE-CAPILLARY: 147 mg/dL — AB (ref 65–99)
GLUCOSE-CAPILLARY: 150 mg/dL — AB (ref 65–99)
Glucose-Capillary: 173 mg/dL — ABNORMAL HIGH (ref 65–99)

## 2015-10-10 LAB — HEPARIN LEVEL (UNFRACTIONATED): HEPARIN UNFRACTIONATED: 0.41 [IU]/mL (ref 0.30–0.70)

## 2015-10-10 LAB — PROTIME-INR
INR: 1.75 — AB (ref 0.00–1.49)
PROTHROMBIN TIME: 20.4 s — AB (ref 11.6–15.2)

## 2015-10-10 LAB — PREPARE RBC (CROSSMATCH)

## 2015-10-10 MED ORDER — WARFARIN SODIUM 5 MG PO TABS
5.0000 mg | ORAL_TABLET | Freq: Once | ORAL | Status: AC
  Administered 2015-10-10: 5 mg via ORAL
  Filled 2015-10-10: qty 1

## 2015-10-10 MED ORDER — SODIUM CHLORIDE 0.9 % IV SOLN
Freq: Once | INTRAVENOUS | Status: AC
  Administered 2015-10-10: 18:00:00 via INTRAVENOUS

## 2015-10-10 MED ORDER — ACETAMINOPHEN 325 MG PO TABS
650.0000 mg | ORAL_TABLET | Freq: Once | ORAL | Status: AC
  Administered 2015-10-10: 650 mg via ORAL
  Filled 2015-10-10: qty 2

## 2015-10-10 MED ORDER — DIPHENHYDRAMINE HCL 25 MG PO CAPS
25.0000 mg | ORAL_CAPSULE | Freq: Once | ORAL | Status: AC
  Administered 2015-10-10: 25 mg via ORAL
  Filled 2015-10-10: qty 1

## 2015-10-10 MED ORDER — FUROSEMIDE 10 MG/ML IJ SOLN
20.0000 mg | Freq: Once | INTRAMUSCULAR | Status: AC
  Administered 2015-10-10: 20 mg via INTRAVENOUS
  Filled 2015-10-10: qty 2

## 2015-10-10 NOTE — Progress Notes (Addendum)
ANTICOAGULATION CONSULT NOTE  Pharmacy Consult:  Heparin and Warfarin Indication: h/o  AVR  No Known Allergies  Patient Measurements: Height: '5\' 10"'$  (177.8 cm) Weight: 203 lb 11.3 oz (92.4 kg) IBW/kg (Calculated) : 73 kg Heparin dosing weight = 92.1 kg  Vital Signs: Temp: 98.3 F (36.8 C) (02/21 0418) Temp Source: Oral (02/21 0418) BP: 122/67 mmHg (02/21 0418) Pulse Rate: 75 (02/21 0418)  Labs:  Recent Labs  10/08/15 0232  10/08/15 2342 10/09/15 0240 10/10/15 0235 10/10/15 0438  HGB 8.3*  --   --  7.9* 7.7*  --   HCT 27.0*  --   --  25.7* 25.1*  --   PLT 194  --   --  206 223  --   LABPROT 16.0*  --   --  18.9*  --  20.4*  INR 1.26  --   --  1.58*  --  1.75*  HEPARINUNFRC 0.27*  < > 0.45 0.35  --  0.41  CREATININE 1.03  --   --   --   --   --   < > = values in this interval not displayed.  Estimated Creatinine Clearance: 71.9 mL/min (by C-G formula based on Cr of 1.03).    Assessment: 46 YOM on Warfarin PTA for [facility]Jude AVR. Cards held Warfarin on admit due to GIB with bleeding risk higher than stroke risk. Warfarin resumed 2/17. Now on heparin bridge. HL remains therapeutic (0.41) on 1600 units/h, INR is 1.58>>1.75, SUBtherapeutic. Hgb 7.7 (down), pltc wnl. S/p 5u PRBC this admit. Monitoring for bleed symptoms with warfarin restart.  PTA Warfarin Dose: '5mg'$  Thur/Sun and 2.'5mg'$  AODs with last home dose on 2/9  Goal of Therapy:  INR goal 2-3  Monitor platelets by anticoagulation protocol: Yes    Plan:  Continue heparin at 1600 units/h Warfarin '5mg'$  x 1 dose tonight Daily INR/HL/CBC Monitor closely for s/sx of bleeding  Elicia Lamp, PharmD, [facility] Clinical Pharmacist Pager [phone] 10/10/2015 8:39 AM

## 2015-10-10 NOTE — Progress Notes (Signed)
Tanner Pena UMP:[id number] DOB: 11-May-1941 DOA: 09/30/2015 PCP: [name], MD  Admit HPI / Brief Narrative: 75 y.o. 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  Admitted from [facility] with melena Transfused 5 units of blood    HPI/Subjective:  Had 1 dark stool yesterday. 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 - cont PPI daily indef - watch for re-bleeding w/ resumption of warfarin  Informed Nursing to let usKoreanow if further bleed-would Involve GI if this occurs HB has fallen from 9-->7.8 Impression in absence of repeat bleeding is that dark stool could have been old blood Expect will be a slow process to ge there therapeutic on INR  SSCP w/ hx of CAD s/p CABG 1991 with redo 1994 Experienced SSCP after his endo - EKG in recovery notable for LBBB which is chronic, making acute usefulness limited - troponin was unrevealing x1 (ordered as 3 lab panel but not done for unclear reasons) - no more pain Cardiology was consulted and did not feel any other workup indicated He has not had any further chest pain  Acute blood loss anemia on 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 S/p 5U PRBC thus far - keep Hgb 8.0 or > - Hgb fluctuating,    Continue Carafate 1 g 4 times a day as well as Protonix 40 twice a day and will need to continue this as an outpatient  H/O aortic valve replacement (St Jude) Coumadin initially held due to GIB - have resumed IV heparin - resume warfarin 2/17- needs to remain hospitalized until INR at goal to assure does not re-bleed (high risk to do so) See #1  DM2  A1c 6.8 - CBG remains above goal - adjust tx and follow  CBG 140 to 160 range  range   Hyperlipidemia with target LDL less than 70 Continue statins  Cardiomyopathy, 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 Improved See above discussion   Code Status: FULL Family Communication: no family present at time of exam Disposition Plan: unclear. Will trasnfuse 1 U PRBC given complex Past cardiac h/o If furthe rbleeding, involve GI again  [facility]ultants: [facility]  Procedures: EGD - 2/15 - results noted above   Antibiotics: none  DVT prophylaxis: IV heparin > warfarin   Objective: Blood pressure 128/42, pulse 50,  temperature 98.2 F (36.8 C), temperature source Oral, resp. rate 16, height '5\' 10"'$  (1.778 m), weight 92.4 kg (203 lb 11.3 oz), SpO2 100 %.  Intake/Output Summary (Last 24 hours) at 10/10/15 1640 Last data filed at 10/10/15 1410  Gross per 24 hour  Intake    137 ml  Output   1400 ml  Net  -1263 ml   Exam: General: No acute respiratory distress- alert  Lungs: Clear to auscultation bilaterally - mild crackles L post lung fields Cardiovascular: Regular rate and rhythm without murmur gallop or  rub Abdomen: Nontender, nondistended  Data Reviewed:  Basic Metabolic Panel:  Recent Labs Lab 10/05/15 0145 10/08/15 0232  NA 139 142  K 4.0 3.7  CL 107 109  CO2 23 25  GLUCOSE 204* 144*  BUN 13 19  CREATININE 0.99 1.03  CALCIUM 9.1 9.0    CBC:  Recent Labs Lab 10/06/15 1801 10/07/15 0319 10/08/15 0232 10/09/15 0240 10/10/15 0235  WBC 7.7 9.8 7.8 6.8 6.4  HGB 9.0* 9.3* 8.3* 7.9* 7.7*  HCT 29.2* 30.1* 27.0* 25.7* 25.1*  MCV 84.4 83.6 83.6 84.3 83.7  PLT 164 172 194 206 223    Liver Function Tests:  Recent Labs Lab 10/05/15 0145  AST 19  ALT 11*  ALKPHOS 24*  BILITOT 0.6  PROT 4.8*  ALBUMIN 2.9*    Coags:  Recent Labs Lab 10/06/15 0400 10/07/15 0319 10/08/15 0232 10/09/15 0240 10/10/15 0438  INR 1.33 1.21 1.26 1.58* 1.75*   CBG:  Recent Labs Lab 10/09/15 1126 10/09/15 1639 10/09/15 2035 10/10/15 0638 10/10/15 1108  GLUCAP 172* 122* 137* 156* 147*    Recent Results (from the past 240 hour(s))  MRSA PCR Screening     Status: None   Collection Time: 09/30/15  5:14 PM  Result Value Ref Range Status   MRSA by PCR NEGATIVE NEGATIVE Final    Comment:        The GeneXpert MRSA Assay (FDA approved for NASAL specimens only), is one component of a comprehensive MRSA colonization surveillance program. It is not intended to diagnose MRSA infection nor to guide or monitor treatment for MRSA infections.     Studies:   Recent x-ray studies have been reviewed in detail by the Attending Physician  Scheduled Meds:  Scheduled Meds: . furosemide  20 mg Oral Daily  . glimepiride  1 mg Oral BID  . insulin aspart  0-5 Units Subcutaneous QHS  . insulin aspart  0-9 Units Subcutaneous TID WC  . isosorbide mononitrate  60 mg Oral Daily  . metoprolol succinate  25 mg Oral Daily  . omega-3 acid ethyl esters  1 g Oral BID  . pantoprazole  40 mg Oral BID  . pregabalin  50 mg Oral TID  . ramipril  2.5 mg Oral Daily  . rosuvastatin  20 mg Oral  q1800  . sucralfate  1 g Oral TID WC & HS  . warfarin  5 mg Oral ONCE-1800  . Warfarin - Pharmacist Dosing Inpatient   Does not apply q1800    Time spent on care of this patient: 35 mins No family +  Verneita Griffes, MD [facility] [phone]

## 2015-10-11 DIAGNOSIS — I5022 Chronic systolic (congestive) heart failure: Secondary | ICD-10-CM

## 2015-10-11 LAB — GLUCOSE, CAPILLARY
GLUCOSE-CAPILLARY: 161 mg/dL — AB (ref 65–99)
GLUCOSE-CAPILLARY: 183 mg/dL — AB (ref 65–99)
GLUCOSE-CAPILLARY: 115 mg/dL — AB (ref 65–99)
Glucose-Capillary: 123 mg/dL — ABNORMAL HIGH (ref 65–99)

## 2015-10-11 LAB — TYPE AND SCREEN
ABO/RH(D): A POS
ANTIBODY SCREEN: NEGATIVE
UNIT DIVISION: 0

## 2015-10-11 LAB — PROTIME-INR
INR: 1.77 — AB (ref 0.00–1.49)
PROTHROMBIN TIME: 20.6 s — AB (ref 11.6–15.2)

## 2015-10-11 LAB — HEPARIN LEVEL (UNFRACTIONATED): Heparin Unfractionated: 0.54 IU/mL (ref 0.30–0.70)

## 2015-10-11 MED ORDER — WARFARIN SODIUM 5 MG PO TABS
5.0000 mg | ORAL_TABLET | Freq: Once | ORAL | Status: AC
  Administered 2015-10-11: 5 mg via ORAL
  Filled 2015-10-11: qty 1

## 2015-10-11 NOTE — Progress Notes (Addendum)
ANTICOAGULATION CONSULT NOTE  Pharmacy Consult:  Heparin and Warfarin Indication: h/o  AVR  No Known Allergies  Patient Measurements: Height: '5\' 10"'$  (177.8 cm) Weight: 202 lb 13.2 oz (92 kg) IBW/kg (Calculated) : 73 kg Heparin dosing weight = 92.1 kg  Vital Signs: Temp: 97.9 F (36.6 C) (02/22 0641) Temp Source: Oral (02/22 0641) BP: 162/41 mmHg (02/22 0641) Pulse Rate: 53 (02/22 0641)  Labs:  Recent Labs  10/09/15 0240 10/10/15 0235 10/10/15 0438 10/11/15 0450  HGB 7.9* 7.7*  --   --   HCT 25.7* 25.1*  --   --   PLT 206 223  --   --   LABPROT 18.9*  --  20.4* 20.6*  INR 1.58*  --  1.75* 1.77*  HEPARINUNFRC 0.35  --  0.41 0.54    Estimated Creatinine Clearance: 71.7 mL/min (by C-G formula based on Cr of 1.03).    Assessment: 44 YOM on Warfarin PTA for [facility] AVR. Cards held Warfarin on admit due to GIB with bleeding risk higher than stroke risk. Warfarin resumed 2/17. Now on heparin bridge. HL remains therapeutic (0.54) on 1600 units/h, INR stable 1.77, SUBtherapeutic. Hgb 7.7 (down), pltc wnl. CBC this AM pending. S/p 5u PRBC this admit. Monitoring for bleed symptoms with warfarin restart.  PTA Warfarin Dose: '5mg'$  Thur/Sun and 2.'5mg'$  AODs with last home dose on 2/9  Goal of Therapy:  INR goal 2-3  Monitor platelets by anticoagulation protocol: Yes    Plan:  Continue heparin at 1600 units/h Warfarin '5mg'$  x 1 dose tonight Daily INR/HL/CBC Monitor closely for s/sx of bleeding  Elicia Lamp, PharmD, [facility] Clinical Pharmacist Pager [phone] 10/11/2015 9:48 AM

## 2015-10-11 NOTE — Progress Notes (Signed)
Tanner Pena BEM:[id number] DOB: 1941/03/05 DOA: 09/30/2015 PCP: [name], MD  Admit HPI / Brief Narrative: 75 y.o. 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  Admitted from [facility] with melena Transfused 5 units of blood    HPI/Subjective: Reported stool start to become lighter, denies ab pain, no n/v, tolerating regular diet, INR still subtherapeutic, remain on heparin drip.  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/ epi and APC - cont PPI daily indef - watch for re-bleeding w/ resumption of warfarin  Informed Nursing to let usKoreanow if further bleed-would Involve GI if this occurs HB has fallen from 9-->7.8 Impression in absence of repeat bleeding is that dark stool could have been old blood Expect will be a slow process to ge there therapeutic on INR  SSCP w/ hx of CAD s/p CABG 1991 with redo 1994 Experienced SSCP after his endo - EKG in recovery notable for LBBB which is chronic, making acute usefulness limited - troponin was unrevealing x1 (ordered as 3 lab panel but not done for unclear reasons) - no more pain Cardiology was consulted and did not feel any other workup indicated He has not had any further chest pain  Acute blood loss anemia on chronic anemia secondary to  gastric ulcer S/p 5U PRBC thus far - keep Hgb 8.0 or > - Hgb fluctuating,  Continue Carafate 1 g 4 times a day as well as Protonix 40 twice a day and will need to continue this as an outpatient  H/O aortic valve replacement (St Jude) Coumadin initially held due to GIB - have resumed IV heparin - resume warfarin 2/17- needs to remain hospitalized until INR at goal to assure does not re-bleed (high risk to do so) See #1  DM2  A1c 6.8 - CBG remains above goal - adjust tx and follow  CBG 140 to 160 range  range   Hyperlipidemia with target LDL less than 70 Continue statins  Cardiomyopathy, 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 Improved See above discussion   Code Status: FULL Family Communication: no family present at time of exam Disposition Plan: unclear. Will trasnfuse 1 U PRBC given complex Past cardiac h/o If furthe rbleeding, involve GI again Also need to wait INr to be therapeutic  [facility] , signed off [facility]igned off  Procedures: EGD - 2/15 - results noted  above   Antibiotics: none  DVT prophylaxis: IV heparin > warfarin   Objective: Blood pressure 162/41, pulse 53, temperature 97.9 F (36.6 C), temperature source Oral, resp. rate 18, height '5\' 10"'$  (1.778 m), weight 92 kg (202 lb 13.2 oz), SpO2 98 %.  Intake/Output Summary (Last 24 hours) at 10/11/15 0921 Last data filed at 10/11/15 0641  Gross per 24 hour  Intake      0 ml  Output   2000 ml  Net  -2000 ml   Exam: General: No acute respiratory distress- alert  Lungs: Clear to  auscultation bilaterally - mild crackles L post lung fields Cardiovascular: Regular rate and rhythm without murmur gallop or rub, metallic heart sounds Abdomen: Nontender, nondistended  Extremity: s/p left bka, no edema right lower extremity   Data Reviewed:  Basic Metabolic Panel:  Recent Labs Lab 10/05/15 0145 10/08/15 0232  NA 139 142  K 4.0 3.7  CL 107 109  CO2 23 25  GLUCOSE 204* 144*  BUN 13 19  CREATININE 0.99 1.03  CALCIUM 9.1 9.0    CBC:  Recent Labs Lab 10/06/15 1801 10/07/15 0319 10/08/15 0232 10/09/15 0240 10/10/15 0235  WBC 7.7 9.8 7.8 6.8 6.4  HGB 9.0* 9.3* 8.3* 7.9* 7.7*  HCT 29.2* 30.1* 27.0* 25.7* 25.1*  MCV 84.4 83.6 83.6 84.3 83.7  PLT 164 172 194 206 223    Liver Function Tests:  Recent Labs Lab 10/05/15 0145  AST 19  ALT 11*  ALKPHOS 24*  BILITOT 0.6  PROT 4.8*  ALBUMIN 2.9*    Coags:  Recent Labs Lab 10/07/15 0319 10/08/15 0232 10/09/15 0240 10/10/15 0438 10/11/15 0450  INR 1.21 1.26 1.58* 1.75* 1.77*   CBG:  Recent Labs Lab 10/10/15 0638 10/10/15 1108 10/10/15 1636 10/10/15 2221 10/11/15 0644  GLUCAP 156* 147* 173* 150* 161*    No results found for this or any previous visit (from the past 240 hour(s)).  Studies:   Recent x-ray studies have been reviewed in detail by the Attending Physician  Scheduled Meds:  Scheduled Meds: . furosemide  20 mg Oral Daily  . glimepiride  1 mg Oral BID  . insulin aspart  0-5 Units Subcutaneous QHS  . insulin aspart  0-9 Units Subcutaneous TID WC  . isosorbide mononitrate  60 mg Oral Daily  . metoprolol succinate  25 mg Oral Daily  . omega-3 acid ethyl esters  1 g Oral BID  . pantoprazole  40 mg Oral BID  . pregabalin  50 mg Oral TID  . ramipril  2.5 mg Oral Daily  . rosuvastatin  20 mg Oral q1800  . sucralfate  1 g Oral TID WC & HS  . Warfarin - Pharmacist Dosing Inpatient   Does not apply q1800    Time spent on care of this patient: 35 mins No family  +  Discharge barrier, need to be on heparin drip till inr therapeutic, need to monitor h/h, may need to re consult GI if hgb continue to decrease  Florencia Reasons, MD PhD [facility] [phone]

## 2015-10-12 LAB — GLUCOSE, CAPILLARY
GLUCOSE-CAPILLARY: 104 mg/dL — AB (ref 65–99)
GLUCOSE-CAPILLARY: 166 mg/dL — AB (ref 65–99)
GLUCOSE-CAPILLARY: 146 mg/dL — AB (ref 65–99)
GLUCOSE-CAPILLARY: 166 mg/dL — AB (ref 65–99)

## 2015-10-12 LAB — CBC
HEMATOCRIT: 29 % — AB (ref 39.0–52.0)
HEMOGLOBIN: 9 g/dL — AB (ref 13.0–17.0)
MCH: 25.9 pg — ABNORMAL LOW (ref 26.0–34.0)
MCHC: 31 g/dL (ref 30.0–36.0)
MCV: 83.6 fL (ref 78.0–100.0)
Platelets: 242 10*3/uL (ref 150–400)
RBC: 3.47 MIL/uL — AB (ref 4.22–5.81)
RDW: 18.1 % — ABNORMAL HIGH (ref 11.5–15.5)
WBC: 6.6 10*3/uL (ref 4.0–10.5)

## 2015-10-12 LAB — BASIC METABOLIC PANEL
ANION GAP: 10 (ref 5–15)
BUN: 15 mg/dL (ref 6–20)
CHLORIDE: 108 mmol/L (ref 101–111)
CO2: 24 mmol/L (ref 22–32)
Calcium: 9.2 mg/dL (ref 8.9–10.3)
Creatinine, Ser: 0.86 mg/dL (ref 0.61–1.24)
Glucose, Bld: 124 mg/dL — ABNORMAL HIGH (ref 65–99)
POTASSIUM: 3.6 mmol/L (ref 3.5–5.1)
SODIUM: 142 mmol/L (ref 135–145)

## 2015-10-12 LAB — PROTIME-INR
INR: 1.84 — AB (ref 0.00–1.49)
Prothrombin Time: 21.2 seconds — ABNORMAL HIGH (ref 11.6–15.2)

## 2015-10-12 LAB — HEPARIN LEVEL (UNFRACTIONATED): HEPARIN UNFRACTIONATED: 0.52 [IU]/mL (ref 0.30–0.70)

## 2015-10-12 LAB — MAGNESIUM: Magnesium: 1.7 mg/dL (ref 1.7–2.4)

## 2015-10-12 MED ORDER — WARFARIN SODIUM 7.5 MG PO TABS
7.5000 mg | ORAL_TABLET | Freq: Once | ORAL | Status: AC
  Administered 2015-10-12: 7.5 mg via ORAL
  Filled 2015-10-12: qty 1

## 2015-10-12 NOTE — Progress Notes (Addendum)
ANTICOAGULATION CONSULT NOTE  Pharmacy Consult:  Heparin and Warfarin Indication: h/o  AVR  No Known Allergies  Patient Measurements: Height: '5\' 10"'$  (177.8 cm) Weight: 202 lb 6.1 oz (91.8 kg) IBW/kg (Calculated) : 73 kg Heparin dosing weight = 92.1 kg  Vital Signs: Temp: 97.6 F (36.4 C) (02/23 0500) Temp Source: Oral (02/23 0500) BP: 162/50 mmHg (02/23 0500) Pulse Rate: 57 (02/23 0500)  Labs:  Recent Labs  10/10/15 0235 10/10/15 0438 10/11/15 0450 10/12/15 0330  HGB 7.7*  --   --  9.0*  HCT 25.1*  --   --  29.0*  PLT 223  --   --  242  LABPROT  --  20.4* 20.6* 21.2*  INR  --  1.75* 1.77* 1.84*  HEPARINUNFRC  --  0.41 0.54 0.52  CREATININE  --   --   --  0.86    Estimated Creatinine Clearance: 85.8 mL/min (by C-G formula based on Cr of 0.86).    Assessment: 46 YOM on Warfarin PTA for [facility] AVR. Cards held Warfarin on admit due to GIB with bleeding risk higher than stroke risk. Warfarin resumed 2/17. Now on heparin bridge. HL remains therapeutic (0.52) on 1600 uits/h, INR 1.84 slow trend up, SUBtherapeutic. Hgb 9 (up), pltc wnl. S/p 5u PRBC this admit. Monitoring for bleed symptoms with warfarin restart.  PTA Warfarin Dose: '5mg'$  Thur/Sun and 2.'5mg'$  AODs with last home dose on 2/9  Goal of Therapy:  INR goal 2-3  Monitor platelets by anticoagulation protocol: Yes    Plan:  Continue heparin at 1600 units/h Warfarin 7.'5mg'$  x 1 dose tonight Daily INR/HL/CBC Monitor closely for s/sx of bleeding  Elicia Lamp, PharmD, [facility] Clinical Pharmacist Pager [phone] 10/12/2015 10:28 AM

## 2015-10-12 NOTE — Progress Notes (Signed)
Tanner Pena WRU:[id number] DOB: [birth year] DOA: 09/30/2015 PCP: [name], MD  Admit HPI / Brief Narrative: 75 y.o. 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  Admitted from [facility] with melena Transfused 5 units of blood    HPI/Subjective: No bm in last 24hrs, denies ab pain, no n/v, tolerating regular diet, INR still subtherapeutic, remain on heparin drip.  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 EGD 2/15 noted a healing 1cm greater curvature ulcer, a 43m antral ulcer, a 448mpyloric channel ulcer, and 3 AVMs which were treated w/ epi and APC - cont PPI daily indef - watch for re-bleeding w/ resumption of warfarin  Informed Nursing to let usKoreanow if further bleed-would Involve GI if this occurs HB has fallen from 9-->7.8 Impression in absence of repeat bleeding is that dark stool could have been old blood Expect will be a slow process to ge there therapeutic on INR  SSCP w/ hx of CAD s/p CABG 1991 with redo 1994 Experienced SSCP after his endo - EKG in recovery notable for LBBB which is chronic, making acute usefulness limited - troponin was unrevealing x1 (ordered as 3 lab panel but not done for unclear reasons) - no more pain Cardiology was consulted and did not feel any other workup indicated He has not had any further chest pain  Acute blood loss anemia on 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 S/p 5U  PRBC thus far - keep Hgb 8.0 or > - Hgb fluctuating,  Continue Carafate 1 g 4 times a day as well as Protonix 40 twice a day and will need to continue this as an outpatient  H/O aortic valve replacement (St Jude) Coumadin initially held due to GIB - have resumed IV heparin - resume warfarin 2/17- needs to remain hospitalized until INR at goal to assure does not re-bleed (high risk to do so) See #1  DM2  A1c 6.8 - CBG remains above goal - adjust tx and follow  CBG 140 to 160 range  range   Hyperlipidemia with target LDL less than 70 Continue statins  Cardiomyopathy, 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 Improved See above discussion   Code Status: FULL Family Communication: no family present at time of exam Disposition Plan: unclear. Will trasnfuse 1 U PRBC given complex Past cardiac h/o If furthe rbleeding, involve GI again Also need to wait INr to be therapeutic  [facility] , signed off [place] off  Procedures: EGD - 2/15 - results noted above  Antibiotics: none  DVT prophylaxis: IV heparin > warfarin   Objective: Blood pressure 162/50, pulse 57, temperature 97.6 F (36.4 C), temperature source Oral, resp. rate 18, height '5\' 10"'$  (1.778 m), weight 91.8 kg (202 lb 6.1 oz), SpO2 98 %.  Intake/Output Summary (Last 24 hours) at 10/12/15 1056 Last data filed at 10/11/15 1557  Gross per 24 hour  Intake      0 ml  Output    500 ml  Net   -500 ml   Exam: General: No acute respiratory distress- alert  Lungs: Clear to auscultation  bilaterally - mild crackles L post lung fields Cardiovascular: Regular rate and rhythm without murmur gallop or rub, metallic heart sounds Abdomen: Nontender, nondistended  Extremity: s/p left bka, no edema right lower extremity   Data Reviewed:  Basic Metabolic Panel:  Recent Labs Lab 10/08/15 0232 10/12/15 0330  NA 142 142  K 3.7 3.6  CL 109 108  CO2 25 24  GLUCOSE 144* 124*  BUN 19 15  CREATININE 1.03 0.86  CALCIUM 9.0 9.2  MG  --  1.7    CBC:  Recent Labs Lab 10/07/15 0319 10/08/15 0232 10/09/15 0240 10/10/15 0235 10/12/15 0330  WBC 9.8 7.8 6.8 6.4 6.6  HGB 9.3* 8.3* 7.9* 7.7* 9.0*  HCT 30.1* 27.0* 25.7* 25.1* 29.0*  MCV 83.6 83.6 84.3 83.7 83.6  PLT 172 194 206 223 242    Liver Function Tests: No results for input(s): AST, ALT, ALKPHOS, BILITOT, PROT, ALBUMIN in the last 168 hours.  Coags:  Recent Labs Lab 10/08/15 0232 10/09/15 0240 10/10/15 0438 10/11/15 0450 10/12/15 0330  INR 1.26 1.58* 1.75* 1.77* 1.84*   CBG:  Recent Labs Lab 10/11/15 0644 10/11/15 1124 10/11/15 1607 10/11/15 2112 10/12/15 0624  GLUCAP 161* 183* 115* 123* 104*    No results found for this or any previous visit (from the past 240 hour(s)).  Studies:   Recent x-ray studies have been reviewed in detail by the Attending Physician  Scheduled Meds:  Scheduled Meds: . furosemide  20 mg Oral Daily  . glimepiride  1 mg Oral BID  . insulin aspart  0-5 Units Subcutaneous QHS  . insulin aspart  0-9 Units Subcutaneous TID WC  . isosorbide mononitrate  60 mg Oral Daily  . metoprolol succinate  25 mg Oral Daily  . omega-3 acid ethyl esters  1 g Oral BID  . pantoprazole  40 mg Oral BID  . pregabalin  50 mg Oral TID  . ramipril  2.5 mg Oral Daily  . rosuvastatin  20 mg Oral q1800  . sucralfate  1 g Oral TID WC & HS  . warfarin  7.5 mg Oral ONCE-1800  . Warfarin - Pharmacist Dosing Inpatient   Does not apply q1800    Time spent on care of this patient: 15 mins No  family +  Discharge barrier, need to be on heparin drip till inr therapeutic, need to monitor h/h, may need to re consult GI if hgb continue to decrease  Florencia Reasons, MD PhD [facility] [phone]

## 2015-10-12 NOTE — Progress Notes (Signed)
Utilization review completed.  

## 2015-10-13 LAB — CBC
HEMATOCRIT: 29.1 % — AB (ref 39.0–52.0)
HEMOGLOBIN: 9.1 g/dL — AB (ref 13.0–17.0)
MCH: 26.3 pg (ref 26.0–34.0)
MCHC: 31.3 g/dL (ref 30.0–36.0)
MCV: 84.1 fL (ref 78.0–100.0)
Platelets: 263 10*3/uL (ref 150–400)
RBC: 3.46 MIL/uL — AB (ref 4.22–5.81)
RDW: 17.9 % — ABNORMAL HIGH (ref 11.5–15.5)
WBC: 6.1 10*3/uL (ref 4.0–10.5)

## 2015-10-13 LAB — HEPARIN LEVEL (UNFRACTIONATED): Heparin Unfractionated: 0.48 IU/mL (ref 0.30–0.70)

## 2015-10-13 LAB — BASIC METABOLIC PANEL
ANION GAP: 8 (ref 5–15)
BUN: 17 mg/dL (ref 6–20)
CALCIUM: 9.4 mg/dL (ref 8.9–10.3)
CO2: 24 mmol/L (ref 22–32)
Chloride: 107 mmol/L (ref 101–111)
Creatinine, Ser: 0.73 mg/dL (ref 0.61–1.24)
GLUCOSE: 152 mg/dL — AB (ref 65–99)
POTASSIUM: 3.9 mmol/L (ref 3.5–5.1)
Sodium: 139 mmol/L (ref 135–145)

## 2015-10-13 LAB — PROTIME-INR
INR: 1.91 — ABNORMAL HIGH (ref 0.00–1.49)
PROTHROMBIN TIME: 21.8 s — AB (ref 11.6–15.2)

## 2015-10-13 LAB — GLUCOSE, CAPILLARY
GLUCOSE-CAPILLARY: 192 mg/dL — AB (ref 65–99)
GLUCOSE-CAPILLARY: 129 mg/dL — AB (ref 65–99)
GLUCOSE-CAPILLARY: 164 mg/dL — AB (ref 65–99)
Glucose-Capillary: 142 mg/dL — ABNORMAL HIGH (ref 65–99)

## 2015-10-13 MED ORDER — WARFARIN SODIUM 7.5 MG PO TABS
7.5000 mg | ORAL_TABLET | Freq: Once | ORAL | Status: AC
  Administered 2015-10-13: 7.5 mg via ORAL
  Filled 2015-10-13: qty 1

## 2015-10-13 NOTE — Progress Notes (Signed)
ANTICOAGULATION CONSULT NOTE  Pharmacy Consult:  Heparin and Warfarin Indication: h/o  AVR  No Known Allergies  Patient Measurements: Height: '5\' 10"'$  (177.8 cm) Weight: 199 lb 4.7 oz (90.4 kg) IBW/kg (Calculated) : 73 kg Heparin dosing weight = 92.1 kg  Vital Signs: Temp: 98.2 F (36.8 C) (02/24 1454) Temp Source: Oral (02/24 1454) BP: 122/44 mmHg (02/24 1454) Pulse Rate: 66 (02/24 1454)  Labs:  Recent Labs  10/11/15 0450 10/12/15 0330 10/13/15 0330 10/13/15 0332  HGB  --  9.0*  --  9.1*  HCT  --  29.0*  --  29.1*  PLT  --  242  --  263  LABPROT 20.6* 21.2*  --  21.8*  INR 1.77* 1.84*  --  1.91*  HEPARINUNFRC 0.54 0.52  --  0.48  CREATININE  --  0.86 0.73  --     Estimated Creatinine Clearance: 91.7 mL/min (by C-G formula based on Cr of 0.73).    Assessment: 22 YOM on Warfarin PTA for [facility].Jude AVR. Cards held Warfarin on admit due to GIB with bleeding risk higher than stroke risk. Warfarin resumed 2/17. Now on heparin bridge. HL remains therapeutic on 1600 uits/h, INR 1.91 slow trend up, but still slightly subtherapeutic. S/p 5u PRBC this admit.   PTA Warfarin Dose: '5mg'$  Thur/Sun and 2.'5mg'$  AODs with last home dose on 2/9  Goal of Therapy:  INR goal 2-3 Monitor platelets by anticoagulation protocol: Yes    Plan:   Continue heparin at 1600 units/h Warfarin 7.'5mg'$  x 1 dose tonight Daily INR/HL/CBC Monitor closely for s/sx of bleeding  Onnie Boer, PharmD Pager: [phone] 10/13/2015 3:25 PM

## 2015-10-13 NOTE — Care Management Important Message (Signed)
Important Message  Patient Details  Name: Tanner Pena MRN: [id number] Date of Birth: [birth year]   Medicare Important Message Given:  Yes    Nathen May 10/13/2015, 1:05 PM

## 2015-10-13 NOTE — Progress Notes (Signed)
Tanner Pena LZJ:[id number] DOB: [birth year] DOA: 09/30/2015 PCP: [name], MD  Admit HPI / Brief Narrative: 75 y.o. 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  Admitted from [facility] with melena Transfused 5 units of blood    HPI/Subjective: No bm in last 24hrs, denies ab pain, no n/v, tolerating regular diet, INR still subtherapeutic, remain on heparin drip.  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 GI procedure appreciated AVMs and also noted. Per their recommendations, we can restart anticoagulation without aspirin or Plavix. I am agreeable with no aspirin or Plavix as he is not had any stents recently.  However he does need to restart warfarin 2 get a target INR of 2.5-3.5. Will need IV heparin bridging. No data for DOAC or Lovenox.  Remains on statin as well as beta blocker and  Imdur for CAD. Heart rate precludes of titrating the beta blocker or up any further. 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 S/p 5U PRBC thus far - keep Hgb 8.0 or > - Hgb fluctuating,  Continue Carafate 1 g 4 times a day as well as Protonix 40 twice a day and will need to continue this as an outpatient  H/O aortic valve replacement (St Jude) Coumadin initially held due to GIB - have resumed IV heparin - resume warfarin 2/17- needs to remain hospitalized until INR at goal to assure does not re-bleed (high risk to do so) See #1  DM2  A1c 6.8 - CBG remains above goal - adjust tx and follow  CBG 140 to 160 range  range   Hyperlipidemia with target LDL less than 70 Continue statins  Cardiomyopathy, ischemic - EF 25-30% by echo 5/14 Well compensated presently  Continue ramipril 2.5 daily, metoprolol 25 daily, Imdur 60 daily Restart much lower dose lasix [home dose 80]-start at 20 mg  po 2/18  PVD s/p bilateral LE intervention, 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 Improved See above discussion   Code Status: FULL Family Communication: no family present at time of exam Disposition Plan: home in a few days once inr >2.5  Consultants: [facility] , signed off [facility], signed  off  Procedures: EGD - 2/15 - results noted above  prbc transfusions  Antibiotics: none  DVT prophylaxis: IV heparin > warfarin   Objective: Blood pressure 146/51, pulse 52, temperature 98.5 F (36.9 C), temperature source Oral, resp. rate 16, height '5\' 10"'$  (1.778 m), weight 90.4 kg (199 lb 4.7 oz), SpO2 98 %.  Intake/Output Summary (Last 24 hours) at 10/13/15 1303 Last data filed at 10/13/15 0839  Gross per 24 hour  Intake    240 ml  Output   1400 ml  Net  -1160 ml   Exam: General: No acute respiratory distress- alert  Lungs: Clear to auscultation bilaterally - mild crackles L post lung fields Cardiovascular: Regular rate and rhythm without murmur gallop or rub, metallic heart sounds Abdomen: Nontender, nondistended  Extremity: s/p left bka, no edema right lower extremity   Data Reviewed:  Basic Metabolic Panel:  Recent Labs Lab 10/08/15 0232 10/12/15 0330 10/13/15 0330  NA 142 142 139  K 3.7 3.6 3.9  CL 109 108 107  CO2 '25 24 24  '$ GLUCOSE 144* 124* 152*  BUN '19 15 17  '$ CREATININE 1.03 0.86 0.73  CALCIUM 9.0 9.2 9.4  MG  --  1.7  --     CBC:  Recent Labs Lab 10/08/15 0232 10/09/15 0240 10/10/15 0235 10/12/15 0330 10/13/15 0332  WBC 7.8 6.8 6.4 6.6 6.1  HGB 8.3* 7.9* 7.7* 9.0* 9.1*  HCT 27.0* 25.7* 25.1* 29.0* 29.1*  MCV 83.6 84.3 83.7 83.6 84.1  PLT 194 206 223 242 263    Liver Function Tests: No results for input(s): AST, ALT, ALKPHOS, BILITOT, PROT, ALBUMIN in the last 168 hours.  Coags:  Recent Labs Lab 10/09/15 0240 10/10/15 0438 10/11/15 0450 10/12/15 0330 10/13/15 0332  INR 1.58* 1.75* 1.77* 1.84* 1.91*   CBG:  Recent Labs Lab 10/12/15 1155 10/12/15 1617 10/12/15 2118 10/13/15 0621 10/13/15 1121  GLUCAP 166* 146* 166* 142* 192*    No results found for this or any previous visit (from the past 240 hour(s)).  Studies:   Recent x-ray studies have been reviewed in detail by the Attending Physician  Scheduled  Meds:  Scheduled Meds: . furosemide  20 mg Oral Daily  . glimepiride  1 mg Oral BID  . insulin aspart  0-5 Units Subcutaneous QHS  . insulin aspart  0-9 Units Subcutaneous TID WC  . isosorbide mononitrate  60 mg Oral Daily  . metoprolol succinate  25 mg Oral Daily  . omega-3 acid ethyl esters  1 g Oral BID  . pantoprazole  40 mg Oral BID  . pregabalin  50 mg Oral TID  . ramipril  2.5 mg Oral Daily  . rosuvastatin  20 mg Oral q1800  . sucralfate  1 g Oral TID WC & HS  . Warfarin - Pharmacist Dosing Inpatient   Does not apply q1800    Time spent on care of this patient: 15 mins No family +  Discharge barrier, need to be on  heparin drip till inr therapeutic, need to monitor h/h, may need to re consult GI if hgb continue to decrease  Florencia Reasons, MD PhD [facility] [phone]

## 2015-10-14 LAB — CBC
HEMATOCRIT: 29.7 % — AB (ref 39.0–52.0)
Hemoglobin: 8.9 g/dL — ABNORMAL LOW (ref 13.0–17.0)
MCH: 24.9 pg — ABNORMAL LOW (ref 26.0–34.0)
MCHC: 30 g/dL (ref 30.0–36.0)
MCV: 83.2 fL (ref 78.0–100.0)
PLATELETS: 280 10*3/uL (ref 150–400)
RBC: 3.57 MIL/uL — AB (ref 4.22–5.81)
RDW: 17.7 % — ABNORMAL HIGH (ref 11.5–15.5)
WBC: 5.9 10*3/uL (ref 4.0–10.5)

## 2015-10-14 LAB — GLUCOSE, CAPILLARY
Glucose-Capillary: 143 mg/dL — ABNORMAL HIGH (ref 65–99)
Glucose-Capillary: 254 mg/dL — ABNORMAL HIGH (ref 65–99)

## 2015-10-14 LAB — PROTIME-INR
INR: 2.22 — ABNORMAL HIGH (ref 0.00–1.49)
Prothrombin Time: 24.4 seconds — ABNORMAL HIGH (ref 11.6–15.2)

## 2015-10-14 LAB — HEPARIN LEVEL (UNFRACTIONATED): Heparin Unfractionated: 0.39 IU/mL (ref 0.30–0.70)

## 2015-10-14 MED ORDER — WARFARIN SODIUM 5 MG PO TABS: 5.0000 mg | ORAL_TABLET | Freq: Once | ORAL | Status: DC

## 2015-10-14 MED ORDER — FUROSEMIDE 80 MG PO TABS: 40.0000 mg | ORAL_TABLET | Freq: Every day | ORAL | Status: DC

## 2015-10-14 MED ORDER — WARFARIN SODIUM 5 MG PO TABS: 5.0000 mg | ORAL_TABLET | Freq: Every day | ORAL | Status: DC

## 2015-10-14 NOTE — Discharge Summary (Signed)
Discharge Summary  Tanner Pena SWN:[id number] DOB: 1941-03-07  PCP: [name], MD  Admit date: 09/30/2015 Discharge date: 10/14/2015  Time spent: >34mns  Recommendations for Outpatient Follow-up:  1. F/u with PMD within two weeks for hospital discharge follow up 2. F/u with cardiology as scheduled 3. F/u with GI in [place] 4. Patient aware to have INR checked next Monday and Thursday  Discharge Diagnoses:  Active Hospital Problems   Diagnosis Date Noted  . UGIB (upper gastrointestinal bleed) 09/30/2015  . Frequent PVCs 10/05/2015  . Cardiomyopathy, ischemic 10/05/2015  . AVM (arteriovenous malformation)   . Acute gastric ulcer   . S/P AVR (aortic valve replacement)   . Controlled type 2 diabetes mellitus without complication ([facility]   . Chronic gastric ulcer   . Gastric AVM   . Coronary artery disease involving native coronary artery of native heart without angina pectoris   . AKI (acute kidney injury) ([place] 10/01/2015  . GI bleed 06/14/2015  . Chronic systolic heart failure ([place] 03/05/2015  . Acute blood loss anemia 03/02/2015  . Hyperlipidemia with target LDL less than 70 01/05/2013  . DM2 (diabetes mellitus, type 2) ([place] 01/05/2013  . Long term current use of anticoagulant therapy 11/11/2012  . H/O aortic valve replacement- St Jude 11/11/2012    Resolved Hospital Problems   Diagnosis Date Noted Date Resolved  No resolved problems to display.    Discharge Condition: stable  Diet recommendation: heart healthy/carb modified  Filed Weights   10/12/15 0500 10/13/15 0622 10/14/15 0432  Weight: 91.8 kg (202 lb 6.1 oz) 90.4 kg (199 lb 4.7 oz) 92.1 kg (203 lb 0.7 oz)    History of present illness:  [facility] Patient is a 75year old man well-known to me from multiple prior admissions for melena and non-ST elevated MI. This is complicated by an aortic mechanical valve that  requires anticoagulation with Coumadin. 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 He states that today he noticed dark blood in his stools which prompted his immediate return to the emergency department for evaluation. He also notes that over the past 2-3 days he has become more short of breath with exertion as well as having some minimal chest pain. Upon arrival to the emergency department he is found to have a hemoglobin of 6.3. After discussion with GI physician, we have been made aware of the fact that the blood bank does not have much blood in stock and also with the lack of cardiology coverage over the weekend it has been determined that it is [facility]  Hospital Course:  Principal Problem:   UGIB (upper gastrointestinal bleed) Active Problems:   Long term current use of anticoagulant therapy   H/O aortic valve replacement- St Jude   DM2 (diabetes mellitus, type 2) (HCC)   Hyperlipidemia with target LDL less than 70   Acute blood loss anemia   Chronic systolic heart failure (HCC)   GI bleed   AKI (acute kidney injury) ([place]   Coronary artery disease involving native coronary artery of native heart without angina pectoris   Chronic gastric ulcer   Gastric AVM   Frequent PVCs   Cardiomyopathy, ischemic   AVM (arteriovenous malformation)   Acute gastric ulcer   S/P AVR (aortic valve replacement)  Controlled type 2 diabetes mellitus without complication [facility])  75 y.o. 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 GI procedure appreciated AVMs and also noted. Per their recommendations, we can restart anticoagulation without aspirin or Plavix. I am agreeable with no aspirin or Plavix as he is not had any stents recently.  However he does need to restart warfarin 2 get a target INR of 2.5-3.5. Will need IV heparin bridging. No data for DOAC or Lovenox. Remains on statin as well as beta blocker and Imdur for CAD. Heart rate precludes of titrating the beta blocker or up any further. If blood pressure continues to be stable to elevated, I would restart ACE inhibitor as well for afterload reduction."   CAD s/p CABG 1991 with redo 1994, recent NSTEMI  In 2016 Experienced Substernal chest Pain and hypertension after his endo - EKG in recovery notable for LBBB which is chronic, - troponin was unrevealing x1, no more pain Cardiology was consulted and did not feel any other workup indicated He has not had any further chest pain Due to life threatening bleed required 6units of prbc, asa and plavix stopped, Continue f/u with cardiology outpatient   Acute blood loss anemia on 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 S/p 5U PRBC thus far - keep Hgb 8.0 or > - Hgb fluctuating, s/p EGD Continue Carafate 1 g 4 times a day as well as Protonix 40 twice a day, no bleed, stool brown at discharge, continue outpatient GI follow up.  H/O aortic valve replacement  (St Jude) Coumadin initially held due to GIB - have resumed IV heparin - resume warfarin 2/17-  INR 2.2 on 2/25, d/c heparin drip Discharge home on coumadin '5mg'$  po qd, repeat INR more frequently, coumadin clinic to continue guide coumadin dose  Cardiomyopathy, ischemic - EF 25-30% by echo 5/14 Well compensated presently  Continue ramipril 2.5 daily, metoprolol 25 daily, Imdur 60 daily Restart much lower dose lasix [home dose 80]-start at 20 mg po 2/18, discharged home with lasix '40mg'$  po daily, lasix dose need to  Be continue titrated on outpatient basis.   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 mg 3 times a day for phantom pain  Acute kidney injury prerenal in the setting of acute GI bleed - resolved w/ volume expansion - renal function has normalized    Code Status: FULL Family Communication: patient and his girlfriend in room Disposition Plan: home on 2/25  Consultants:  [facility]  [facility]  Procedures: EGD - 2/15 - results noted above  prbc transfusions x6units  Antibiotics: none  DVT prophylaxis: IV heparin > warfarin    Discharge Exam: BP 126/31 mmHg  Pulse 54  Temp(Src) 98.4 F (36.9 C) (Oral)  Resp 16  Ht '5\' 10"'$  (1.778 m)  Wt 92.1 kg (203 lb 0.7 oz)  BMI 29.13 kg/m2  SpO2 97%  General: No acute respiratory distress- alert  Lungs: Clear to auscultation bilaterally - mild crackles L post lung fields Cardiovascular: Regular rate and rhythm without murmur gallop or rub, metallic heart sounds Abdomen: Nontender, nondistended  Extremity: s/p left bka, no edema right lower extremity   Discharge Instructions You were cared for by a hospitalist during your hospital stay. If you have any questions about your discharge medications or the care you received while you were in the hospital after you are  discharged, you can call the unit and asked to speak with the hospitalist on call if the hospitalist that took care of you is not available. Once you are discharged, your primary care physician will handle any further medical issues. Please note that NO REFILLS for any discharge medications will be authorized once you are discharged, as it is imperative that you return to your primary care physician (or establish a relationship with a primary care physician if you do not have one) for your aftercare needs so that they can reassess your need for medications and monitor your lab values.      Discharge Instructions    Diet - low sodium heart healthy    Complete by:  As directed   Carb modified     Increase activity slowly    Complete by:  As directed             Medication List    STOP taking these medications        clopidogrel 75 MG tablet  Commonly known as:  PLAVIX      TAKE these medications        albuterol 4 MG tablet  Commonly known as:  PROVENTIL  Take 4 mg by mouth 3 (three) times daily.     albuterol-ipratropium 18-103 MCG/ACT inhaler  Commonly known as:  COMBIVENT  Inhale 1 puff into the lungs 4 (four) times daily. Coughing/ Shortness of Breath     allopurinol 300 MG tablet  Commonly known as:  ZYLOPRIM  Take 300 mg by mouth daily.     CARAFATE 1 GM/10ML suspension  Generic drug:  sucralfate  TAKE 10 ML BY MOUTH FOUR TIMES DAILY WITH MEALS AND AT BEDTIME.     cholecalciferol 1000 units tablet  Commonly known as:  VITAMIN D  Take 2,000 Units by mouth daily.     fenofibrate 145 MG tablet  Commonly known as:  TRICOR  TAKE 1 TABLET BY MOUTH ONCE DAILY FOR CHOLESTEROL.     ferrous sulfate 325 (65 FE) MG EC tablet  Take 1 tablet (325 mg total) by mouth daily with breakfast.     fish oil-omega-3 fatty acids 1000 MG capsule  Take 1 capsule (1 g total) by mouth 2 (two) times daily.     furosemide 80 MG tablet  Commonly known as:  LASIX  Take 0.5 tablets (40 mg  total) by mouth daily.     glimepiride 1 MG tablet  Commonly known as:  AMARYL  Take 1 mg by mouth 2 (two) times daily.     isosorbide mononitrate 60 MG  24 hr tablet  Commonly known as:  IMDUR  Take 1 tablet (60 mg total) by mouth daily.     metoprolol succinate 25 MG 24 hr tablet  Commonly known as:  TOPROL-XL  Take 25 mg by mouth daily.     NITROSTAT 0.4 MG SL tablet  Generic drug:  nitroGLYCERIN  PLACE 1 TAB UNDER TONGUE EVERY 5 MIN IF NEEDED FOR CHEST PAIN. MAY USE 3 TIMES.NO RELIEF CALL 911.     oxyCODONE-acetaminophen 10-325 MG tablet  Commonly known as:  PERCOCET  Take 1 tablet by mouth every 4 (four) hours as needed for pain.     pantoprazole 40 MG tablet  Commonly known as:  PROTONIX  Take 1 tablet (40 mg total) by mouth 2 (two) times daily before a meal.     potassium chloride SA 20 MEQ tablet  Commonly known as:  K-DUR,KLOR-CON  Take 20 mEq by mouth daily.     pregabalin 50 MG capsule  Commonly known as:  LYRICA  Take one capsule by mouth twice daily for pains     ramipril 2.5 MG capsule  Commonly known as:  ALTACE  Take 1 capsule (2.5 mg total) by mouth daily.     rosuvastatin 20 MG tablet  Commonly known as:  CRESTOR  TAKE (1) TABLET BY MOUTH AT BEDTIME FOR CHOLESTEROL.     sitaGLIPtin 50 MG tablet  Commonly known as:  JANUVIA  Take 50 mg by mouth daily.     vitamin C 500 MG tablet  Commonly known as:  ASCORBIC ACID  Take 500 mg by mouth daily.     warfarin 5 MG tablet  Commonly known as:  COUMADIN  Take 1 tablet (5 mg total) by mouth daily at 6 PM.       No Known Allergies Follow-up Information    Follow up with [name], MD In 2 weeks.   Specialty:  Internal Medicine   Why:   hospital follow up   Contact information:   [address] [place] [address] [phone]       Follow up with [name] A, MD In 1 month.   Specialty:  Cardiology   Why:  hospital discharge follow up   Contact information:   [address] [place] [address] [phone]       Follow up with please close monitor INR, have INR checked on Monday and Thursday next week, then once a week. .      Follow up with Manus Rudd, MD In 1 month.   Specialty:  Gastroenterology   Why:  for gi bleed   Contact information:   [address] [place] [address] [phone]        The results of significant diagnostics from this hospitalization (including imaging, microbiology, ancillary and laboratory) are listed below for reference.    Significant Diagnostic Studies: No results found.  Microbiology: No results found for this or any previous visit (from the past 240 hour(s)).   Labs: Basic Metabolic Panel:  Recent Labs Lab 10/08/15 0232 10/12/15 0330 10/13/15 0330  NA 142 142 139  K 3.7 3.6 3.9  CL 109 108 107  CO2 '25 24 24  '$ GLUCOSE 144* 124* 152*  BUN '19 15 17  '$ CREATININE 1.03 0.86 0.73  CALCIUM 9.0 9.2 9.4  MG  --  1.7  --    Liver Function Tests: No results for input(s): AST, ALT, ALKPHOS, BILITOT, PROT, ALBUMIN in the last 168 hours. No results  for input(s): LIPASE, AMYLASE in the last 168 hours. No results for input(s): AMMONIA in the last 168 hours. CBC:  Recent Labs Lab 10/09/15 0240 10/10/15 0235 10/12/15 0330 10/13/15 0332 10/14/15 0421  WBC 6.8 6.4 6.6 6.1 5.9  HGB 7.9* 7.7* 9.0* 9.1* 8.9*  HCT 25.7* 25.1* 29.0* 29.1* 29.7*  MCV 84.3 83.7 83.6 84.1 83.2  PLT 206 223 242 263 280   Cardiac Enzymes: No results for input(s): CKTOTAL, CKMB, CKMBINDEX, TROPONINI in the last 168 hours. BNP: BNP (last 3 results)  Recent Labs  12/28/14 0134 02/04/15 0413 07/28/15 1944  BNP 504.3* 593.2* 156.0*    ProBNP (last 3 results) No results for input(s): PROBNP in the last 8760 hours.  CBG:  Recent Labs Lab 10/13/15 1121 10/13/15 1627 10/13/15 2103 10/14/15 0612 10/14/15 1108  GLUCAP 192* 129* 164* 143* 254*       SignedFlorencia Reasons MD, PhD  [facility] 10/14/2015, 1:03 PM

## 2015-10-14 NOTE — Progress Notes (Signed)
ANTICOAGULATION CONSULT NOTE  Pharmacy Consult:  Heparin and Warfarin Indication: h/o  AVR  No Known Allergies  Patient Measurements: Height: '5\' 10"'$  (177.8 cm) Weight: 203 lb 0.7 oz (92.1 kg) IBW/kg (Calculated) : 73 kg Heparin dosing weight = 92.1 kg  Vital Signs: Temp: 98.4 F (36.9 C) (02/25 0432) Temp Source: Oral (02/25 0432) BP: 126/31 mmHg (02/25 0432) Pulse Rate: 54 (02/25 0432)  Labs:  Recent Labs  10/12/15 0330 10/13/15 0330 10/13/15 0332 10/14/15 0421  HGB 9.0*  --  9.1* 8.9*  HCT 29.0*  --  29.1* 29.7*  PLT 242  --  263 280  LABPROT 21.2*  --  21.8* 24.4*  INR 1.84*  --  1.91* 2.22*  HEPARINUNFRC 0.52  --  0.48 0.39  CREATININE 0.86 0.73  --   --     Estimated Creatinine Clearance: 92.4 mL/min (by C-G formula based on Cr of 0.73).    Assessment: 49 YOM on Warfarin PTA for St.Jude AVR. Cards held Warfarin on admit due to GIB with bleeding risk higher than stroke risk. Warfarin resumed 2/17. Now on heparin bridge. HL remains therapeutic on 1600 units/h, INR 1.91>>2.22 slow trend up, but now therapeutic. Hg 8.9 stable, plt wnl. No bleed documented. S/p 5u PRBC this admit.   Spoke with Cards Oval Linsey): INR goal 2.5-3.5 outpatient but with AVR, reasonable to go with 2-3 per guideline recommendations, especially with bleeding risk.  PTA Warfarin Dose: '5mg'$  Thur/Sun and 2.'5mg'$  AODs with last home dose on 2/9  Goal of Therapy:  INR goal 2-3 Monitor platelets by anticoagulation protocol: Yes  Plan:  Dc heparin gtt Warfarin '5mg'$  x 1 dose tonight Daily INR, mon CBC Monitor closely for s/sx of bleeding  Elicia Lamp, PharmD, [facility] Clinical Pharmacist Pager [phone] 10/14/2015 10:58 AM

## 2015-10-14 NOTE — Progress Notes (Signed)
Discharged to home with family office visits in place teaching done  

## 2015-10-16 ENCOUNTER — Other Ambulatory Visit: Payer: Self-pay | Admitting: Cardiovascular Disease

## 2015-10-16 ENCOUNTER — Other Ambulatory Visit: Payer: Self-pay | Admitting: *Deleted

## 2015-10-16 DIAGNOSIS — Z7901 Long term (current) use of anticoagulants: Secondary | ICD-10-CM | POA: Diagnosis not present

## 2015-10-16 DIAGNOSIS — Z954 Presence of other heart-valve replacement: Secondary | ICD-10-CM | POA: Diagnosis not present

## 2015-10-16 LAB — COAGUCHEK XS/INR WAIVED
INR: 2.1 — AB (ref 0.9–1.1)
PROTHROMBIN TIME: 25.8 s

## 2015-10-16 NOTE — Patient Outreach (Signed)
Call for transition of Care week #1 Discharge 10/14/15 No answer to  Phone, unable to leave a message. Will attempt again later. Royetta Crochet. Laymond Purser, RN, BSN, [place] [phone]

## 2015-10-17 NOTE — Patient Outreach (Signed)
Transition of Care week #1 Spoke with patient briefly, he states he is running late for an appointment this morning. Requests call back this after noon. Plan to attempt again later. Royetta Crochet. Laymond Purser, RN, BSN, [place] [phone]

## 2015-10-18 DIAGNOSIS — K922 Gastrointestinal hemorrhage, unspecified: Secondary | ICD-10-CM | POA: Diagnosis not present

## 2015-10-18 DIAGNOSIS — I255 Ischemic cardiomyopathy: Secondary | ICD-10-CM | POA: Diagnosis not present

## 2015-10-18 DIAGNOSIS — I10 Essential (primary) hypertension: Secondary | ICD-10-CM | POA: Diagnosis not present

## 2015-10-18 DIAGNOSIS — Q2730 Arteriovenous malformation, site unspecified: Secondary | ICD-10-CM | POA: Diagnosis not present

## 2015-10-18 DIAGNOSIS — Z1389 Encounter for screening for other disorder: Secondary | ICD-10-CM | POA: Diagnosis not present

## 2015-10-18 DIAGNOSIS — I5022 Chronic systolic (congestive) heart failure: Secondary | ICD-10-CM | POA: Diagnosis not present

## 2015-10-19 ENCOUNTER — Ambulatory Visit (INDEPENDENT_AMBULATORY_CARE_PROVIDER_SITE_OTHER): Payer: Medicare Other | Admitting: Pharmacist Clinician (PhC)/ Clinical Pharmacy Specialist

## 2015-10-19 ENCOUNTER — Other Ambulatory Visit: Payer: Self-pay | Admitting: *Deleted

## 2015-10-19 ENCOUNTER — Other Ambulatory Visit: Payer: Self-pay | Admitting: Cardiovascular Disease

## 2015-10-19 DIAGNOSIS — Z954 Presence of other heart-valve replacement: Secondary | ICD-10-CM

## 2015-10-19 DIAGNOSIS — Z7901 Long term (current) use of anticoagulants: Secondary | ICD-10-CM

## 2015-10-19 DIAGNOSIS — Z952 Presence of prosthetic heart valve: Secondary | ICD-10-CM

## 2015-10-19 DIAGNOSIS — I509 Heart failure, unspecified: Secondary | ICD-10-CM | POA: Diagnosis not present

## 2015-10-19 LAB — COAGUCHEK XS/INR WAIVED
INR: 1.9 — AB (ref 0.9–1.1)
PROTHROMBIN TIME: 22.9 s

## 2015-10-19 LAB — PROTIME-INR: INR: 1.9 — AB (ref <=1.1)

## 2015-10-19 NOTE — Patient Outreach (Signed)
Transition of Care call attempted. No answer, left message requesting call back. Royetta Crochet. Laymond Purser, RN, BSN, [place] [phone]

## 2015-10-24 ENCOUNTER — Ambulatory Visit (INDEPENDENT_AMBULATORY_CARE_PROVIDER_SITE_OTHER): Payer: Medicare Other | Admitting: Pharmacist Clinician (PhC)/ Clinical Pharmacy Specialist

## 2015-10-24 ENCOUNTER — Other Ambulatory Visit: Payer: Self-pay | Admitting: Cardiovascular Disease

## 2015-10-24 DIAGNOSIS — Z7901 Long term (current) use of anticoagulants: Secondary | ICD-10-CM | POA: Diagnosis not present

## 2015-10-24 DIAGNOSIS — Z952 Presence of prosthetic heart valve: Secondary | ICD-10-CM

## 2015-10-24 DIAGNOSIS — Z954 Presence of other heart-valve replacement: Secondary | ICD-10-CM

## 2015-10-24 LAB — COAGUCHEK XS/INR WAIVED
INR: 2.9 — AB (ref 0.9–1.1)
PROTHROMBIN TIME: 35.1 s

## 2015-10-24 LAB — PROTIME-INR: INR: 2.9 — AB (ref <=1.1)

## 2015-10-25 ENCOUNTER — Encounter: Payer: Self-pay | Admitting: *Deleted

## 2015-10-25 ENCOUNTER — Other Ambulatory Visit: Payer: Self-pay | Admitting: *Deleted

## 2015-10-25 NOTE — Patient Outreach (Signed)
Transition of Care program call attempted Multiple attempts made and messages left for patient. Will send unable to contact letter and if no response will close per protocol. Royetta Crochet. Laymond Purser, RN, BSN, [place] [phone]

## 2015-10-27 ENCOUNTER — Other Ambulatory Visit: Payer: Self-pay | Admitting: Interventional Radiology

## 2015-10-27 DIAGNOSIS — K551 Chronic vascular disorders of intestine: Secondary | ICD-10-CM

## 2015-11-07 ENCOUNTER — Ambulatory Visit (INDEPENDENT_AMBULATORY_CARE_PROVIDER_SITE_OTHER): Payer: Medicare Other | Admitting: Pharmacist Clinician (PhC)/ Clinical Pharmacy Specialist

## 2015-11-07 ENCOUNTER — Encounter: Payer: Self-pay | Admitting: Cardiovascular Disease

## 2015-11-07 ENCOUNTER — Ambulatory Visit (INDEPENDENT_AMBULATORY_CARE_PROVIDER_SITE_OTHER): Payer: Medicare Other | Admitting: Cardiovascular Disease

## 2015-11-07 VITALS — BP 140/60 | HR 64 | Ht 70.0 in | Wt 216.2 lb

## 2015-11-07 DIAGNOSIS — T45511A Poisoning by anticoagulants, accidental (unintentional), initial encounter: Secondary | ICD-10-CM | POA: Diagnosis not present

## 2015-11-07 DIAGNOSIS — Z7901 Long term (current) use of anticoagulants: Secondary | ICD-10-CM

## 2015-11-07 DIAGNOSIS — I10 Essential (primary) hypertension: Secondary | ICD-10-CM

## 2015-11-07 DIAGNOSIS — I25810 Atherosclerosis of coronary artery bypass graft(s) without angina pectoris: Secondary | ICD-10-CM

## 2015-11-07 DIAGNOSIS — D6832 Hemorrhagic disorder due to extrinsic circulating anticoagulants: Secondary | ICD-10-CM

## 2015-11-07 DIAGNOSIS — D689 Coagulation defect, unspecified: Secondary | ICD-10-CM

## 2015-11-07 DIAGNOSIS — Z89512 Acquired absence of left leg below knee: Secondary | ICD-10-CM

## 2015-11-07 DIAGNOSIS — T45515A Adverse effect of anticoagulants, initial encounter: Secondary | ICD-10-CM

## 2015-11-07 DIAGNOSIS — Z954 Presence of other heart-valve replacement: Secondary | ICD-10-CM

## 2015-11-07 LAB — POCT INR: INR: 4.5

## 2015-11-07 MED ORDER — METOPROLOL SUCCINATE ER 25 MG PO TB24: ORAL_TABLET | ORAL | Status: DC

## 2015-11-07 NOTE — Patient Instructions (Addendum)
Your physician recommends that you schedule a follow-up appointment in: 4-6 months with Dr [name].   Your physician has recommended you make the following change in your medication: the metoprolol succ has been increased to 1 tablet in the morning and 1/2 tablet in the evening. A new prescription has been sent to your pharmacy to reflect this change.

## 2015-11-08 ENCOUNTER — Encounter: Payer: Self-pay | Admitting: *Deleted

## 2015-11-08 NOTE — Patient Outreach (Signed)
No response to outreach letter. Will send case closure letter to patient,MD. Will send notice to close to [place]. Royetta Crochet. Laymond Purser, RN, BSN, [place] [phone]

## 2015-11-13 ENCOUNTER — Encounter: Payer: Self-pay | Admitting: Cardiovascular Disease

## 2015-11-13 NOTE — Progress Notes (Signed)
Patient ID: Tanner Pena, male   DOB: [birth year], 75 y.o.   MRN: [id number]    HPI: Tanner Pena is a 75 y.o. male who presents to the office today for a 4 month cardiologic followup evaluation.    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 In 1991 he underwent initial CABG revascularization surgery to his RCA and also underwent St. Jude's aortic valve replacement surgery for aortic valve stenosis. In 1994, he required redo CABG surgery to his left coronary system which was not bypassed in 1991.  In 2005, a stent was placed in his RCA vein graft the patient has significant peripheral vascular disease and is status post intervention to both his right iliac and bilateral SFAs with Dr. [name] and has also undergone rotational atherectomy of his SFAs. Additionally, he has  mild to moderate carotid stenosis, a history of hypertension, tobacco use, type 2 diabetes mellitus, as well as obstructive sleep apnea. A carotid Doppler study done in September 2013 showed moderate amount of fibrous plaque and right carotid internal stenosis of less than 49% both the right external carotid narrowing of 70-99%. 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 His left  internal carotid revealed 50-69% of diameter reduction. Tanner Pena denies recent episodes of chest pain. He continues to smoke cigarettes less than one pack per day and has been smoking approximately 60 years , although he did quit on 3 occasions. He does note shortness of breath with activity. He denies loss of symptoms of claudication.  He was hospitalized on August 09, 2013 for recurrent chest pain symptoms. Cardiac enzymes were negative. He was hypertensive on admission with a blood pressure of 206/117. He was treated with diuretic therapy and initially his Coumadin was held. A Myoview study  showed scar without ischemia and  consequently Coumadin was resumed and cardiac catheterization was not done.  sedation was His ejection fraction on his Myoview study in December 2014 was 30%. He does have chronic left bundle branch block. He has COPD. He also has continued claudication in the left lower extremity due to chronic disease not amenable to PTA.   A followup echo Doppler study showed an ejection fraction of 40-45% on 10/20/2013.  His St. Jude's mechanical aortic valve was not well visualized.  His peak and mean aortic gradient is were 33 and 19 mm, but there was concern perhaps this may underestimate the severity of his potential stenosis due to his reduced cardiac output.  He did have global hypokinesis with regional variation and grade 1 diastolic dysfunction.  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  He also has had several GI bleeds. He was taken off Plavix in October 2016.  He had another upper GI bleed on Coumadin therapy in November 2016.  His last hospitalization was  November 15 through 07/09/2015.  He had a recent history of gastritis which has been complicated by his requirement for anticoagulation with his mechanical aVR.  His INR was supratherapeutic on admission.  He was seen by GI who did not recommend repeat EGD at that time.  His bleeding stabilized.  During his recent Tanner Pena hospitalization a follow-up echo Doppler study on 06/21/2015 showed an ejection fraction at 40%.  There was diffuse hypokinesis and grade 2 diastolic dysfunction.  His mechanical valve in the aortic position was well-seated.  Mean gradient 15, peak gradient 37  mm.  There was no aortic insufficiency.    Since I last saw him in early December 2016, he has had several hospitalizations.  He developed chest pain and ruled in for non-ST segment elevation MI in December 2016.  Peak troponin was 1.25.  A nuclear stress test was low risk and showed prior myocardial infarction.  The basal inferior, basal inferolateral, mid inferior, mid  inferolateral location without ischemia.  He also has had recurrent upper GI bleeds and was last hospitalized from February 11 through 10/14/2015.  He is off aspirin therapy and used to be on his Coumadin with his St. Jude aortic valve replacement.  Presently, he denies any episodes of chest pain.  He denies presyncope or syncope.  He has been on pantoprazole 40 mg twice a day in addition to Carafate for his GI bleed.  He is diabetic on glimepiride and Januvia.  He continues to be on ramipril, imdur, andToprol-XL for CAD and hypertension.  He does have COPD on Combivent, albuterol.  He presents for evaluation  Past Medical History  Diagnosis Date  . Type 2 diabetes mellitus ([place]) 2007  . Essential hypertension   . Gout   . Hypercholesteremia   . Peripheral vascular disease ([place])     Lower extremity PCI/stenting  . COPD (chronic obstructive pulmonary disease) ([place])   . S/P aortic valve replacement 1990    a. St. Jude  . Chronic back pain   . Dysphagia   . Neuromuscular disorder ([place])   . Peripheral neuropathy ([place])   . Critical lower limb ischemia   . CAD (coronary artery disease)     a. 05/13/14 [name] s/p overlapping DESx2 to SVG to RCA. b.  s/p CABG '90 with redo '94 & stent to RCA SVG in 2005  . Chronic toe ulcer ([place])     a. Left foot  . Chronic systolic heart failure ([place])   . History of kidney stones   . GERD (gastroesophageal reflux disease)   . Arthritis   . Sleep apnea   . Myocardial infarction ([place])   . GI bleeding 05/31/2015    Source not identified.  . Acute blood loss anemia 02/2015 & 05/2015    Hemoglobin 5.5 on 05/31/15; status post transfusion.    Past Surgical History  Procedure Laterality Date  . Aortic valve replacement  1990    St. Jude  . Rotator cuff repair      right  . Cataract extraction      bilateral  . Coronary stent placement  2005    RCA vein graft A 3.0x13.0 TAXUS stent was then placed int he vessel a Viva 3.0x4.0 (perfusion balloon was made ready  it was placed through the entire lenght of the stent  . Peripheral vascular procedures lower extremities      Right external iliac  artery PTA and stenting as well as bilateral SFA intervention remotely. Repeat procedures in 2011 bilaterally  . Coronary artery bypass graft  1994    6 vessels  . [name]  06/13/2011    Procedure: Venia Minks DILATION;  Surgeon: [name], MD;  Location: [facility];  Service: Endoscopy;  Laterality: N/A;  Dilated to 56.   . Angioplasty illiac artery    . Back surgery  0630,1601    2  . Eye surgery    . Amputation Left 07/13/2014    Procedure: Transmetatarsal Amputation;  Surgeon: [name], MD;  Location: [place];  Service: Orthopedics;  Laterality: Left;  .  Lower extremity angiogram N/A 02/15/2013    Procedure: LOWER EXTREMITY ANGIOGRAM;  Surgeon: [name], MD;  Location: [facility] CATH LAB;  Service: Cardiovascular;  Laterality: N/A;  . Left heart catheterization with coronary angiogram N/A 05/11/2014    Procedure: LEFT HEART CATHETERIZATION WITH CORONARY ANGIOGRAM;  Surgeon: [name], MD;  Location: [facility] CATH LAB;  Service: Cardiovascular;  Laterality: N/A;  . Percutaneous coronary stent intervention (pci-s) N/A 05/13/2014    Procedure: PERCUTANEOUS CORONARY STENT INTERVENTION (PCI-S);  Surgeon: [name], MD;  Location: [facility] CATH LAB;  Service: Cardiovascular;  Laterality: N/A;  . Lower extremity angiogram N/A 06/06/2014    Procedure: LOWER EXTREMITY ANGIOGRAM;  Surgeon: [name], MD;  Location: [facility] CATH LAB;  Service: Cardiovascular;  Laterality: N/A;  . Amputation Left 08/20/2014    Procedure: Revision Transmetatarsal Amputation versus Below Knee Amputation;  Surgeon: [name], MD;  Location: [place];  Service: Orthopedics;  Laterality: Left;  . Stump revision Left 09/23/2014    Procedure: Revision Left Below Knee Amputation;  Surgeon: [name], MD;  Location: [place];  Service: Orthopedics;  Laterality: Left;  . Stump  revision Left 10/13/2014    Procedure: REVISION LEFT BELOW KNEE AMPUTATION STUMP;  Surgeon: [name], MD;  Location: [facility];  Service: Orthopedics;  Laterality: Left;  . Esophagogastroduodenoscopy N/A 02/09/2015    DR. Schooler: Normal EGD  . Esophagogastroduodenoscopy  05/2011    Dr. [name]: s/p esophageal dilation, antral erosions/nodularity with benign biopsies  . Colonoscopy  05/2011    Dr. [name]: benign rectal polyp, left sided tics, ascending colonic ulcers (path c/w ischemia)  . Colonoscopy with propofol N/A 06/01/2015    RMR: normal appearing rectal mucosa. Scattered left-sided diverticula. the remainder of the colonic mucosa appeared normal. the distal 5 cm of terminal ileal mucosa also appeared normal. retroflexion was performed.   Freda Munro capsule study N/A 06/08/2015    Procedure: GIVENS CAPSULE STUDY;  Surgeon: [name], MD;  Location: [facility];  Service: Endoscopy;  Laterality: N/A;  0700   . Esophagogastroduodenoscopy (egd) with propofol N/A 06/20/2015    Procedure: ESOPHAGOGASTRODUODENOSCOPY (EGD) WITH PROPOFOL;  Surgeon: Danie Binder, MD;  Location: [facility];  Service: Endoscopy;  Laterality: N/A;  . Biopsy N/A 06/20/2015    Procedure: BIOPSY;  Surgeon: Danie Binder, MD;  Location: [facility];  Service: Endoscopy;  Laterality: N/A;  . Esophagogastroduodenoscopy N/A 10/04/2015    Procedure: ESOPHAGOGASTRODUODENOSCOPY (EGD);  Surgeon: Doran Stabler, MD;  Location: [facility] ENDOSCOPY;  Service: Endoscopy;  Laterality: N/A;    No Known Allergies  Current Outpatient Prescriptions  Medication Sig Dispense Refill  . albuterol (PROVENTIL) 4 MG tablet Take 4 mg by mouth 3 (three) times daily.    [name] albuterol-ipratropium (COMBIVENT) 18-103 MCG/ACT inhaler Inhale 1 puff into the lungs 4 (four) times daily. Coughing/ Shortness of Breath    . allopurinol (ZYLOPRIM) 300 MG tablet Take 300 mg by mouth daily.    [name] CARAFATE 1 GM/10ML suspension TAKE 10 ML BY MOUTH FOUR TIMES  DAILY WITH MEALS AND AT BEDTIME. 420 mL 0  . cholecalciferol (VITAMIN D) 1000 UNITS tablet Take 2,000 Units by mouth daily.    . fenofibrate (TRICOR) 145 MG tablet TAKE 1 TABLET BY MOUTH ONCE DAILY FOR CHOLESTEROL. 30 tablet 3  . ferrous sulfate 325 (65 FE) MG EC tablet Take 1 tablet (325 mg total) by mouth daily with breakfast.    . fish oil-omega-3 fatty acids 1000  MG capsule Take 1 capsule (1 g total) by mouth 2 (two) times daily.    . furosemide (LASIX) 80 MG tablet Take 0.5 tablets (40 mg total) by mouth daily. 30 tablet 0  . glimepiride (AMARYL) 1 MG tablet Take 1 mg by mouth 2 (two) times daily.    . isosorbide mononitrate (IMDUR) 60 MG 24 hr tablet Take 1 tablet (60 mg total) by mouth daily. 90 tablet 3  . metoprolol succinate (TOPROL-XL) 25 MG 24 hr tablet Take 1 tablet in the morning and 1/2 tablet in the evening 45 tablet 6  . NITROSTAT 0.4 MG SL tablet PLACE 1 TAB UNDER TONGUE EVERY 5 MIN IF NEEDED FOR CHEST PAIN. MAY USE 3 TIMES.NO RELIEF CALL 911. 25 tablet 4  . oxyCODONE-acetaminophen (PERCOCET) 10-325 MG per tablet Take 1 tablet by mouth every 4 (four) hours as needed for pain. (Patient taking differently: Take 1 tablet by mouth every 3 (three) hours as needed for pain. ) 30 tablet 0  . pantoprazole (PROTONIX) 40 MG tablet Take 1 tablet (40 mg total) by mouth 2 (two) times daily before a meal. 60 tablet 2  . potassium chloride SA (K-DUR,KLOR-CON) 20 MEQ tablet Take 20 mEq by mouth daily.      . pregabalin (LYRICA) 50 MG capsule Take one capsule by mouth twice daily for pains (Patient taking differently: Take 50 mg by mouth 3 (three) times daily. ) 60 capsule 5  . ramipril (ALTACE) 5 MG capsule Take by mouth 2 (two) times daily. Take 1 tab in the morning and half tab in the evening    . rosuvastatin (CRESTOR) 20 MG tablet TAKE (1) TABLET BY MOUTH AT BEDTIME FOR CHOLESTEROL. 30 tablet 3  . sitaGLIPtin (JANUVIA) 50 MG tablet Take 50 mg by mouth daily.    . vitamin C (ASCORBIC ACID) 500  MG tablet Take 500 mg by mouth daily.    [name] warfarin (COUMADIN) 2.5 MG tablet Use as directed    . warfarin (COUMADIN) 5 MG tablet Take 1 tablet (5 mg total) by mouth daily at 6 PM. 30 tablet 0   No current facility-administered medications for this visit.    Socially, he is married has 4 children 8 grandchildren. He does not routinely exercise he denies alcohol use. He is smoking one half to less than one pack per day. 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     ROS General: Negative; No fevers, chills, or night sweats;  HEENT: Positive for decreased hearing.  No changes in vision, sinus congestion, difficulty swallowing Pulmonary: Negative; No cough, wheezing, shortness of breath, hemoptysis Cardiovascular: Negative; No chest pain, presyncope, syncope, palpitations Positive for history of claudication; recently stable GI: Positive for upper GI bleeding  GU: Negative; No dysuria, hematuria, or difficulty voiding Musculoskeletal: Negative; no myalgias, joint pain, or weakness Hematologic/Oncology: Negative; no easy bruising, bleeding Endocrine: Positive for diabetes mellitus Neuro: Negative; no changes in balance, headaches Skin: Negative; No rashes or skin lesions Psychiatric: Negative; No behavioral problems, depression Sleep: Negative; No snoring, daytime sleepiness, hypersomnolence, bruxism, restless legs, hypnogognic hallucinations, no cataplexy Other comprehensive 14 point system review is negative.  PE BP 140/60 mmHg  Pulse 64  Ht 5' 10"  (1.778 m)  Wt 216 lb 4 oz (98.09 kg)  BMI 31.03 kg/m2   Wt Readings from Last 3 Encounters:  11/07/15 216 lb 4 oz (98.09 kg)  10/14/15 203 lb 0.7 oz (92.1 kg)  08/23/15 212 lb (96.163  kg)   General: Alert, oriented, no distress.  HEENT: Normocephalic, atraumatic. Pupils round and reactive; sclera anicteric; no Mouth/Parynx benign; Mallinpatti scale 3 Neck: No JVD, bilateral carotid bruits Lungs:  decreased breath sounds; no wheezing or rales Chest wall: No tenderness to palpation Heart: RRR, s1 s2 normal  2/6 SEM with crisp prosthetic valve sounds no S3 gallop. No rub. No heaves Abdomen: soft, nontender; no hepatosplenomehaly, BS+; abdominal aorta nontender and not dilated by palpation. Small umbilical protrusion Back: No CVA tenderness Pulses 2+ upper, slightly diminished lower extremities. Extremities: Left below the knee amputation  Neurologic: grossly nonfocal Psychological: Normal affect and mood  ECG (independently read by me): Sinus rhythm with frequent unifocal PVCs and first-degree AV block.  Left bundle branch block.  December 2016 ECG (independently read by me): Normal sinus rhythm with first-degree AV block with a PR interval at 230 ms.  Left bundle branch block with repolarization.  Occasional PVC.  April 2015 ECG (independently read by me): sinus rhythm with lateral branch block and repolarization changes.  Occasional PVCs.  First degree AV block with a PR interval of 242 ms   09/20/2013 ECG (independently read by me): Sinus bradycardia 58 beats per minute. First degree block;  left bundle branch   LABS:   BMP Latest Ref Rng 10/13/2015 10/12/2015 10/08/2015  Glucose 65 - 99 mg/dL 152(H) 124(H) 144(H)  BUN 6 - 20 mg/dL 17 15 19   Creatinine 0.61 - 1.24 mg/dL 0.73 0.86 1.03  Sodium 135 - 145 mmol/L 139 142 142  Potassium 3.5 - 5.1 mmol/L 3.9 3.6 3.7  Chloride 101 - 111 mmol/L 107 108 109  CO2 22 - 32 mmol/L 24 24 25   Calcium 8.9 - 10.3 mg/dL 9.4 9.2 9.0   Hepatic Function Latest Ref Rng 10/05/2015 10/03/2015 09/30/2015  Total Protein 6.5 - 8.1 g/dL 4.8(L) 5.7(L) 5.3(L)  Albumin 3.5 - 5.0 g/dL 2.9(L) 3.0(L) 2.9(L)  AST 15 - 41 U/L 19 23 17   ALT 17 - 63 U/L 11(L) 10(L) 12(L)  Alk Phosphatase 38 - 126 U/L 24(L) 27(L) 22(L)  Total Bilirubin 0.3 - 1.2 mg/dL 0.6 0.4 0.5   CBC Latest Ref Rng 10/14/2015 10/13/2015 10/12/2015  WBC 4.0 - 10.5 K/uL 5.9 6.1 6.6  Hemoglobin 13.0 -  17.0 g/dL 8.9(L) 9.1(L) 9.0(L)  Hematocrit 39.0 - 52.0 % 29.7(L) 29.1(L) 29.0(L)  Platelets 150 - 400 K/uL 280 263 242   Lab Results  Component Value Date   MCV 83.2 10/14/2015   MCV 84.1 10/13/2015   MCV 83.6 10/12/2015   Lab Results  Component Value Date   TSH 0.932 12/28/2014   Lab Results  Component Value Date   HGBA1C 6.8* 09/30/2015   Lipid Panel     Component Value Date/Time   CHOL 123 05/09/2014 0530   CHOL 120 01/07/2013 1058   TRIG 161* 05/09/2014 0530   TRIG 219* 01/07/2013 1058   HDL 29* 05/09/2014 0530   HDL 26* 01/07/2013 1058   CHOLHDL 4.2 05/09/2014 0530   VLDL 32 05/09/2014 0530   LDLCALC 62 05/09/2014 0530   LDLCALC 50 01/07/2013 1058   INR: 4.5 today  RADIOLOGY: US Arterial Seg Single  12/24/2012   *RADIOLOGY REPORT*  Clinical Data: Diabetes, nonhealing great toe ulcer, diabetes, hypertension  NONINVASIVE PHYSIOLOGIC VASCULAR STUDY OF BILATERAL LOWER EXTREMITIES  Technique:  Evaluation of both lower extremities were performed at rest, including calculation of ankle-brachial indices with single level Doppler, pressure and pulse volume recording.  Comparison:  None.  Findings:  Right ABI: 1.07  Left ABI: 0.68  Right Lower Extremity: Triphasic right tibial wave form and a biphasic right dorsalis pedis wave form.  No significant pressure gradient.  Normal ABI.  Right toe pressure 86.  Left Lower Extremity: Irregular biphasic left tibial tracing and irregular monophasic left dorsalis pedis tracing.  48 mmHg pressure gradient in the left ankle compared to the left brachial pressure. This results in an abnormal ABI measuring 0.68.  Left toe pressure could not be obtained.  IMPRESSION: Abnormal left ABI measuring 0.68.  This is indicative of left lower extremity significant vascular disease.  Normal right ABI.   Original Report Authenticated By: Jerilynn Mages. Shick, M.D.      ASSESSMENT AND PLAN:   Tanner. Sposito is a 75 year old white male who is 26 years status post initial  CABG surgery to his RCA at which time he underwent St. Jude aortic valve replacement surgery for  severe aortic valve stenosis . He is 23 years status post CABG surgery to his left coronary system and 12 years status post stenting to his RCA vein graft.  He has peripheral vascular disease and is status post intervention by Dr. [name].  He has required chronic anticoagulation therapy due to his mechanical aortic prosthesis.  He has experienced several episodes of  GI bleeds felt to be due to gastritis.  He underwent a nuclear perfusion study in December 2016 which showed inferior and inferolateral scar without ischemia.  He subsequently suffered a non-ST segment elevation MI in the setting of anemia.  Reviewed his previous hospitalizations.  Presently, he is chest pain free.  He is no longer on aspirin therapy.  We checked his INR today and he was supratherapeutic at 4.5 and he was told to hold his Coumadin and dose adjustment was made.  He does have frequent PVCs on monitoring and I have recommended.  Additional titration of his Toprol and he will take 25 no grams in the morning and 12.5 mg at night.  He has underlying first-degree AV block with left bundle branch block and otherwise his heart rate was in the 60s.  We will try to keep his INR of 2.5 as target with his St. Jude AV prosthesis.  He will continue to stay on both Carafate in addition to Protonix 40 mg twice a day.  I will see him in 4 months for reevaluation or sooner if problems arise.     Time spent: 25 minutes  Troy Sine, M.D., [facility] 11/13/2015  4:53 PM

## 2015-11-15 ENCOUNTER — Other Ambulatory Visit: Payer: Self-pay | Admitting: Cardiovascular Disease

## 2015-11-15 NOTE — Telephone Encounter (Signed)
Rx request sent to pharmacy.  

## 2015-11-16 ENCOUNTER — Other Ambulatory Visit: Payer: Self-pay | Admitting: Cardiovascular Disease

## 2015-11-16 DIAGNOSIS — Z7901 Long term (current) use of anticoagulants: Secondary | ICD-10-CM | POA: Diagnosis not present

## 2015-11-16 DIAGNOSIS — Z954 Presence of other heart-valve replacement: Secondary | ICD-10-CM | POA: Diagnosis not present

## 2015-11-16 LAB — COAGUCHEK XS/INR WAIVED
INR: 3.6 — ABNORMAL HIGH (ref 0.9–1.1)
Prothrombin Time: 43.3 s

## 2015-11-16 LAB — PROTIME-INR: INR: 3.6 — AB (ref <=1.1)

## 2015-11-20 ENCOUNTER — Ambulatory Visit (INDEPENDENT_AMBULATORY_CARE_PROVIDER_SITE_OTHER): Payer: Medicare Other | Admitting: Pharmacist Clinician (PhC)/ Clinical Pharmacy Specialist

## 2015-11-20 DIAGNOSIS — Z954 Presence of other heart-valve replacement: Secondary | ICD-10-CM

## 2015-11-20 DIAGNOSIS — Z952 Presence of prosthetic heart valve: Secondary | ICD-10-CM

## 2015-11-20 DIAGNOSIS — Z7901 Long term (current) use of anticoagulants: Secondary | ICD-10-CM

## 2015-11-21 ENCOUNTER — Emergency Department (HOSPITAL_COMMUNITY): Payer: Medicare Other

## 2015-11-21 ENCOUNTER — Emergency Department (HOSPITAL_COMMUNITY)
Admission: EM | Admit: 2015-11-21 | Discharge: 2015-11-21 | Disposition: A | Discharge status: Home / Self Care | Payer: Medicare Other | Attending: Emergency Medicine | Admitting: Emergency Medicine

## 2015-11-21 DIAGNOSIS — I252 Old myocardial infarction: Secondary | ICD-10-CM | POA: Insufficient documentation

## 2015-11-21 DIAGNOSIS — J449 Chronic obstructive pulmonary disease, unspecified: Secondary | ICD-10-CM | POA: Diagnosis not present

## 2015-11-21 DIAGNOSIS — M1990 Unspecified osteoarthritis, unspecified site: Secondary | ICD-10-CM | POA: Insufficient documentation

## 2015-11-21 DIAGNOSIS — R05 Cough: Secondary | ICD-10-CM | POA: Diagnosis not present

## 2015-11-21 DIAGNOSIS — I5023 Acute on chronic systolic (congestive) heart failure: Secondary | ICD-10-CM | POA: Insufficient documentation

## 2015-11-21 DIAGNOSIS — J9801 Acute bronchospasm: Secondary | ICD-10-CM | POA: Insufficient documentation

## 2015-11-21 DIAGNOSIS — R0682 Tachypnea, not elsewhere classified: Secondary | ICD-10-CM | POA: Diagnosis not present

## 2015-11-21 DIAGNOSIS — Z87891 Personal history of nicotine dependence: Secondary | ICD-10-CM | POA: Insufficient documentation

## 2015-11-21 DIAGNOSIS — I2510 Atherosclerotic heart disease of native coronary artery without angina pectoris: Secondary | ICD-10-CM | POA: Diagnosis not present

## 2015-11-21 DIAGNOSIS — Z79899 Other long term (current) drug therapy: Secondary | ICD-10-CM | POA: Diagnosis not present

## 2015-11-21 DIAGNOSIS — Z7984 Long term (current) use of oral hypoglycemic drugs: Secondary | ICD-10-CM | POA: Diagnosis not present

## 2015-11-21 DIAGNOSIS — I110 Hypertensive heart disease with heart failure: Secondary | ICD-10-CM | POA: Insufficient documentation

## 2015-11-21 DIAGNOSIS — E1142 Type 2 diabetes mellitus with diabetic polyneuropathy: Secondary | ICD-10-CM | POA: Insufficient documentation

## 2015-11-21 DIAGNOSIS — R062 Wheezing: Secondary | ICD-10-CM | POA: Diagnosis not present

## 2015-11-21 DIAGNOSIS — E1151 Type 2 diabetes mellitus with diabetic peripheral angiopathy without gangrene: Secondary | ICD-10-CM | POA: Diagnosis not present

## 2015-11-21 DIAGNOSIS — R0602 Shortness of breath: Secondary | ICD-10-CM | POA: Diagnosis not present

## 2015-11-21 DIAGNOSIS — R079 Chest pain, unspecified: Secondary | ICD-10-CM | POA: Diagnosis not present

## 2015-11-21 LAB — COMPREHENSIVE METABOLIC PANEL
ALBUMIN: 3.9 g/dL (ref 3.5–5.0)
ALK PHOS: 31 U/L — AB (ref 38–126)
ALT: 13 U/L — AB (ref 17–63)
ANION GAP: 8 (ref 5–15)
AST: 19 U/L (ref 15–41)
BILIRUBIN TOTAL: 0.3 mg/dL (ref 0.3–1.2)
BUN: 20 mg/dL (ref 6–20)
CALCIUM: 8.8 mg/dL — AB (ref 8.9–10.3)
CO2: 23 mmol/L (ref 22–32)
CREATININE: 1.12 mg/dL (ref 0.61–1.24)
Chloride: 109 mmol/L (ref 101–111)
GFR calc Af Amer: >60 mL/min (ref >60)
GFR calc non Af Amer: >60 mL/min (ref >60)
GLUCOSE: 274 mg/dL — AB (ref 65–99)
Potassium: 4.2 mmol/L (ref 3.5–5.1)
Sodium: 140 mmol/L (ref 135–145)
TOTAL PROTEIN: 6.9 g/dL (ref 6.5–8.1)

## 2015-11-21 LAB — BRAIN NATRIURETIC PEPTIDE: B NATRIURETIC PEPTIDE 5: 737 pg/mL — AB (ref 0.0–100.0)

## 2015-11-21 LAB — CBC WITH DIFFERENTIAL/PLATELET
BASOS ABS: 0.1 10*3/uL (ref 0.0–0.1)
BASOS PCT: 1 %
EOS ABS: 0.4 10*3/uL (ref 0.0–0.7)
EOS PCT: 5 %
HCT: 31.3 % — ABNORMAL LOW (ref 39.0–52.0)
Hemoglobin: 9.6 g/dL — ABNORMAL LOW (ref 13.0–17.0)
Lymphocytes Relative: 21 %
Lymphs Abs: 1.6 10*3/uL (ref 0.7–4.0)
MCH: 24.5 pg — ABNORMAL LOW (ref 26.0–34.0)
MCHC: 30.7 g/dL (ref 30.0–36.0)
MCV: 79.8 fL (ref 78.0–100.0)
MONO ABS: 0.7 10*3/uL (ref 0.1–1.0)
MONOS PCT: 9 %
Neutro Abs: 5 10*3/uL (ref 1.7–7.7)
Neutrophils Relative %: 64 %
PLATELETS: 280 10*3/uL (ref 150–400)
RBC: 3.92 MIL/uL — ABNORMAL LOW (ref 4.22–5.81)
RDW: 18.9 % — AB (ref 11.5–15.5)
WBC: 7.8 10*3/uL (ref 4.0–10.5)

## 2015-11-21 LAB — PROTIME-INR
INR: 3.4 — ABNORMAL HIGH (ref 0.00–1.49)
PROTHROMBIN TIME: 33.6 s — AB (ref 11.6–15.2)

## 2015-11-21 LAB — TROPONIN I
Troponin I: 0.04 ng/mL — ABNORMAL HIGH (ref <0.031)
Troponin I: 0.04 ng/mL — ABNORMAL HIGH (ref <0.031)

## 2015-11-21 MED ORDER — ALBUTEROL (5 MG/ML) CONTINUOUS INHALATION SOLN
INHALATION_SOLUTION | RESPIRATORY_TRACT | Status: AC
  Administered 2015-11-21: 03:00:00
  Filled 2015-11-21: qty 20

## 2015-11-21 MED ORDER — GI COCKTAIL ~~LOC~~
ORAL | Status: AC
  Filled 2015-11-21: qty 30

## 2015-11-21 MED ORDER — FUROSEMIDE 10 MG/ML IJ SOLN
80.0000 mg | Freq: Once | INTRAMUSCULAR | Status: AC
  Administered 2015-11-21: 80 mg via INTRAVENOUS
  Filled 2015-11-21: qty 8

## 2015-11-21 MED ORDER — FUROSEMIDE 10 MG/ML IJ SOLN: 60.0000 mg | Freq: Once | INTRAMUSCULAR | Status: DC

## 2015-11-21 MED ORDER — IPRATROPIUM BROMIDE 0.02 % IN SOLN: 1.0000 mg | Freq: Once | RESPIRATORY_TRACT | Status: DC

## 2015-11-21 MED ORDER — ALBUTEROL (5 MG/ML) CONTINUOUS INHALATION SOLN: 10.0000 mg/h | INHALATION_SOLUTION | RESPIRATORY_TRACT | Status: DC

## 2015-11-21 MED ORDER — NITROGLYCERIN 2 % TD OINT
1.0000 [in_us] | TOPICAL_OINTMENT | Freq: Once | TRANSDERMAL | Status: AC
  Administered 2015-11-21: 1 [in_us] via TOPICAL
  Filled 2015-11-21: qty 1

## 2015-11-21 MED ORDER — GI COCKTAIL ~~LOC~~
30.0000 mL | Freq: Once | ORAL | Status: AC
  Administered 2015-11-21: 30 mL via ORAL

## 2015-11-21 MED ORDER — ASPIRIN 81 MG PO CHEW
324.0000 mg | CHEWABLE_TABLET | Freq: Once | ORAL | Status: AC
  Administered 2015-11-21: 324 mg via ORAL
  Filled 2015-11-21: qty 4

## 2015-11-21 MED ORDER — FUROSEMIDE 80 MG PO TABS: 80.0000 mg | ORAL_TABLET | Freq: Once | ORAL | Status: DC | PRN

## 2015-11-21 MED ORDER — IPRATROPIUM BROMIDE 0.02 % IN SOLN
RESPIRATORY_TRACT | Status: AC
  Administered 2015-11-21: 03:00:00
  Filled 2015-11-21: qty 5

## 2015-11-21 NOTE — ED Notes (Signed)
Pt reports worsening CP. New ekg obtained, MD made aware.

## 2015-11-21 NOTE — Discharge Instructions (Signed)
Take an extra lasix a day until the swelling is gone in your right leg. Call Dr Evette Georges office today to get an appointment to have him reevaluate you this week for your congestive heart failure. Continue to use your inhaler for the wheezing. Return to emergency department if you get fever, you struggle to breathe again, you get persistent chest pain, or you seem worse.    Heart Failure Heart failure means your heart has trouble pumping blood. This makes it hard for your body to work well. Heart failure is usually a long-term (chronic) condition. You must take good care of yourself and follow your doctor's treatment plan. HOME CARE  Take your heart medicine as told by your doctor.  Do not stop taking medicine unless your doctor tells you to.  Do not skip any dose of medicine.  Refill your medicines before they run out.  Take other medicines only as told by your doctor or pharmacist.  Stay active if told by your doctor. The elderly and people with severe heart failure should talk with a doctor about physical activity.  Eat heart-healthy foods. Choose foods that are without trans fat and are low in saturated fat, cholesterol, and salt (sodium). This includes fresh or frozen fruits and vegetables, fish, lean meats, fat-free or low-fat dairy foods, whole grains, and high-fiber foods. Lentils and dried peas and beans (legumes) are also good choices.  Limit salt if told by your doctor.  Cook in a healthy way. Roast, grill, broil, bake, poach, steam, or stir-fry foods.  Limit fluids as told by your doctor.  Weigh yourself every morning. Do this after you pee (urinate) and before you eat breakfast. Write down your weight to give to your doctor.  Take your blood pressure and write it down if your doctor tells you to.  Ask your doctor how to check your pulse. Check your pulse as told.  Lose weight if told by your doctor.  Stop smoking or chewing tobacco. Do not use gum or patches that help  you quit without your doctor's approval.  Schedule and go to doctor visits as told.  Nonpregnant women should have no more than 1 drink a day. Men should have no more than 2 drinks a day. Talk to your doctor about drinking alcohol.  Stop illegal drug use.  Stay current with shots (immunizations).  Manage your health conditions as told by your doctor.  Learn to manage your stress.  Rest when you are tired.  If it is really hot outside:  Avoid intense activities.  Use air conditioning or fans, or get in a cooler place.  Avoid caffeine and alcohol.  Wear loose-fitting, lightweight, and light-colored clothing.  If it is really cold outside:  Avoid intense activities.  Layer your clothing.  Wear mittens or gloves, a hat, and a scarf when going outside.  Avoid alcohol.  Learn about heart failure and get support as needed.  Get help to maintain or improve your quality of life and your ability to care for yourself as needed. GET HELP IF:   You gain weight quickly.  You are more short of breath than usual.  You cannot do your normal activities.  You tire easily.  You cough more than normal, especially with activity.  You have any or more puffiness (swelling) in areas such as your hands, feet, ankles, or belly (abdomen).  You cannot sleep because it is hard to breathe.  You feel like your heart is beating fast (palpitations).  You  get dizzy or light-headed when you stand up. GET HELP RIGHT AWAY IF:   You have trouble breathing.  There is a change in mental status, such as becoming less alert or not being able to focus.  You have chest pain or discomfort.  You faint. MAKE SURE YOU:   Understand these instructions.  Will watch your condition.  Will get help right away if you are not doing well or get worse.   This information is not intended to replace advice given to you by your health care provider. Make sure you discuss any questions you have with  your health care provider.   Document Released: 05/14/2008 Document Revised: 08/26/2014 Document Reviewed: 09/21/2012 Elsevier Interactive Patient Education [employer].

## 2015-11-21 NOTE — ED Notes (Signed)
Walked pt approx. 50 foot. Pt O2 sats 98% on RA, Pt reported "slight" SOB. Pt in no acute distress

## 2015-11-21 NOTE — ED Notes (Signed)
Pt reports 2 hours of COPD flare. EMS reports on site O2 of 91 % on RA. Albuterol neb given in route. Pt has diminished BS bilateral lungs all lobes. Respiratory in room, hour continuous neb set up. Pt able to speak in full sentences and in no acute distress at this time

## 2015-11-21 NOTE — ED Provider Notes (Signed)
CSN: [id number]     Arrival date & time 11/21/15  0243 History   First MD Initiated Contact with Patient 11/21/15 0330     Chief Complaint  Patient presents with  . Shortness of Breath     (Consider location/radiation/quality/duration/timing/severity/associated sxs/prior Treatment) HPI patient reports he noticed this afternoon when they were doing some errands he had some shortness of breath. He states he's never felt that way before. He states he tried a sublingual nitroglycerin and it helped. He states later on this evening they watch TV and he went to bed around 11 PM. He states at 12 midnight he started feeling short of breath. He states he's had a cough off and on and sometimes coughs up white mucus. He denies fever. He thinks he may have had some wheezing off and on. He does report some left-sided chest pressure that has lasted since about midnight till about 3 AM, lasted about 3 hours. He denies nausea, vomiting, or diaphoresis. He states he's been having some swelling in his right lower leg that goes away in the morning. He is status post left BKA. He does not have a nebulizer at home but he does use inhalers.  PCP Dr Gerarda Fraction Cardiology Dr [name]  Past Medical History  Diagnosis Date  . Type 2 diabetes mellitus ([place]) 2007  . Essential hypertension   . Gout   . Hypercholesteremia   . Peripheral vascular disease ([place])     Lower extremity PCI/stenting  . COPD (chronic obstructive pulmonary disease) ([place])   . S/P aortic valve replacement 1990    a. St. Jude  . Chronic back pain   . Dysphagia   . Neuromuscular disorder ([place])   . Peripheral neuropathy ([place])   . Critical lower limb ischemia   . CAD (coronary artery disease)     a. 05/13/14 [name] s/p overlapping DESx2 to SVG to RCA. b.  s/p CABG '90 with redo '94 & stent to RCA SVG in 2005  . Chronic toe ulcer ([place])     a. Left foot  . Chronic systolic heart failure ([place])   . History of kidney stones   . GERD (gastroesophageal reflux  disease)   . Arthritis   . Sleep apnea   . Myocardial infarction ([place])   . GI bleeding 05/31/2015    Source not identified.  . Acute blood loss anemia 02/2015 & 05/2015    Hemoglobin 5.5 on 05/31/15; status post transfusion.   Past Surgical History  Procedure Laterality Date  . Aortic valve replacement  1990    St. Jude  . Rotator cuff repair      right  . Cataract extraction      bilateral  . Coronary stent placement  2005    RCA vein graft A 3.0x13.0 TAXUS stent was then placed int he vessel a Viva 3.0x4.0 (perfusion balloon was made ready it was placed through the entire lenght of the stent  . Peripheral vascular procedures lower extremities      Right external iliac  artery PTA and stenting as well as bilateral SFA intervention remotely. Repeat procedures in 2011 bilaterally  . Coronary artery bypass graft  1994    6 vessels  . [name]  06/13/2011    Procedure: Venia Minks DILATION;  Surgeon: [name], MD;  Location: [facility];  Service: Endoscopy;  Laterality: N/A;  Dilated to 56.   . Angioplasty illiac artery    . Back surgery  [id number]    2  .  Eye surgery    . Amputation Left 07/13/2014    Procedure: Transmetatarsal Amputation;  Surgeon: [name], MD;  Location: [place];  Service: Orthopedics;  Laterality: Left;  . Lower extremity angiogram N/A 02/15/2013    Procedure: LOWER EXTREMITY ANGIOGRAM;  Surgeon: [name], MD;  Location: [facility] CATH LAB;  Service: Cardiovascular;  Laterality: N/A;  . Left heart catheterization with coronary angiogram N/A 05/11/2014    Procedure: LEFT HEART CATHETERIZATION WITH CORONARY ANGIOGRAM;  Surgeon: [name], MD;  Location: [facility] CATH LAB;  Service: Cardiovascular;  Laterality: N/A;  . Percutaneous coronary stent intervention (pci-s) N/A 05/13/2014    Procedure: PERCUTANEOUS CORONARY STENT INTERVENTION (PCI-S);  Surgeon: [name], MD;  Location: [facility] CATH LAB;  Service: Cardiovascular;  Laterality: N/A;  .  Lower extremity angiogram N/A 06/06/2014    Procedure: LOWER EXTREMITY ANGIOGRAM;  Surgeon: [name], MD;  Location: [facility] CATH LAB;  Service: Cardiovascular;  Laterality: N/A;  . Amputation Left 08/20/2014    Procedure: Revision Transmetatarsal Amputation versus Below Knee Amputation;  Surgeon: [name], MD;  Location: [place];  Service: Orthopedics;  Laterality: Left;  . Stump revision Left 09/23/2014    Procedure: Revision Left Below Knee Amputation;  Surgeon: [name], MD;  Location: [place];  Service: Orthopedics;  Laterality: Left;  . Stump revision Left 10/13/2014    Procedure: REVISION LEFT BELOW KNEE AMPUTATION STUMP;  Surgeon: [name], MD;  Location: [facility];  Service: Orthopedics;  Laterality: Left;  . Esophagogastroduodenoscopy N/A 02/09/2015    DR. Schooler: Normal EGD  . Esophagogastroduodenoscopy  05/2011    Dr. [name]: s/p esophageal dilation, antral erosions/nodularity with benign biopsies  . Colonoscopy  05/2011    Dr. [name]: benign rectal polyp, left sided tics, ascending colonic ulcers (path c/w ischemia)  . Colonoscopy with propofol N/A 06/01/2015    RMR: normal appearing rectal mucosa. Scattered left-sided diverticula. the remainder of the colonic mucosa appeared normal. the distal 5 cm of terminal ileal mucosa also appeared normal. retroflexion was performed.   Freda Munro capsule study N/A 06/08/2015    Procedure: GIVENS CAPSULE STUDY;  Surgeon: [name], MD;  Location: [facility];  Service: Endoscopy;  Laterality: N/A;  0700   . Esophagogastroduodenoscopy (egd) with propofol N/A 06/20/2015    Procedure: ESOPHAGOGASTRODUODENOSCOPY (EGD) WITH PROPOFOL;  Surgeon: Danie Binder, MD;  Location: [facility];  Service: Endoscopy;  Laterality: N/A;  . Biopsy N/A 06/20/2015    Procedure: BIOPSY;  Surgeon: Danie Binder, MD;  Location: [facility];  Service: Endoscopy;  Laterality: N/A;  . Esophagogastroduodenoscopy N/A 10/04/2015    Procedure:  ESOPHAGOGASTRODUODENOSCOPY (EGD);  Surgeon: Doran Stabler, MD;  Location: [facility] ENDOSCOPY;  Service: Endoscopy;  Laterality: N/A;   Family History  Problem Relation Age of Onset  . Colon cancer Neg Hx   . Liver disease Neg Hx   . Diabetes Father   . Heart failure Other    Social History  Substance Use Topics  . Smoking status: Former Smoker -- 1.00 packs/day for 55 years    Types: Cigarettes    Start date: 08/19/1953    Quit date: 05/07/2014  . Smokeless tobacco: Current User    Types: Chew    Last Attempt to Quit: 08/20/1995     Comment: Has quit on 3 occasions. Counseling given today 5-10 minutes   I am more than likely going to quit "  . Alcohol Use: No  Comment: Socially, sometimes 12 ounce beer daily, may go month without/no whiskey  nephew staying with him  Review of Systems  All other systems reviewed and are negative.     Allergies  Review of patient's allergies indicates no known allergies.  Home Medications   Prior to Admission medications   Medication Sig Start Date End Date Taking? Authorizing Provider  albuterol (PROVENTIL) 4 MG tablet Take 4 mg by mouth 3 (three) times daily. 01/20/15   Historical Provider, MD  albuterol-ipratropium (COMBIVENT) 18-103 MCG/ACT inhaler Inhale 1 puff into the lungs 4 (four) times daily. Coughing/ Shortness of Breath    Historical Provider, MD  allopurinol (ZYLOPRIM) 300 MG tablet Take 300 mg by mouth daily.    Historical Provider, MD  CARAFATE 1 GM/10ML suspension TAKE 10 ML BY MOUTH FOUR TIMES DAILY WITH MEALS AND AT BEDTIME. 08/30/15   Troy Sine, MD  cholecalciferol (VITAMIN D) 1000 UNITS tablet Take 2,000 Units by mouth daily.    Historical Provider, MD  fenofibrate (TRICOR) 145 MG tablet TAKE 1 TABLET BY MOUTH ONCE DAILY FOR CHOLESTEROL. 08/18/15   [name], MD  ferrous sulfate 325 (65 FE) MG EC tablet Take 1 tablet (325 mg total) by mouth daily with breakfast. 06/02/15   Rexene Alberts, MD  fish oil-omega-3  fatty acids 1000 MG capsule Take 1 capsule (1 g total) by mouth 2 (two) times daily. 01/22/13   Casimiro Needle [place], RPH-CPP  furosemide (LASIX) 80 MG tablet Take 0.5 tablets (40 mg total) by mouth daily. 10/14/15   Florencia Reasons, MD  furosemide (LASIX) 80 MG tablet Take 1 tablet (80 mg total) by mouth once as needed for fluid or edema. 11/21/15   Rolland Porter, MD  glimepiride (AMARYL) 1 MG tablet Take 1 mg by mouth 2 (two) times daily.    Historical Provider, MD  isosorbide mononitrate (IMDUR) 60 MG 24 hr tablet Take 1 tablet (60 mg total) by mouth daily. 07/27/15   Troy Sine, MD  metoprolol succinate (TOPROL-XL) 25 MG 24 hr tablet Take 1 tablet in the morning and 1/2 tablet in the evening 11/07/15   Troy Sine, MD  NITROSTAT 0.4 MG SL tablet PLACE 1 TAB UNDER TONGUE EVERY 5 MIN IF NEEDED FOR CHEST PAIN. MAY USE 3 TIMES.NO RELIEF CALL 911. 07/20/15   Eileen Stanford, PA-C  oxyCODONE-acetaminophen (PERCOCET) 10-325 MG per tablet Take 1 tablet by mouth every 4 (four) hours as needed for pain. Patient taking differently: Take 1 tablet by mouth every 3 (three) hours as needed for pain.  09/23/14   [name], MD  pantoprazole (PROTONIX) 40 MG tablet Take 1 tablet (40 mg total) by mouth 2 (two) times daily before a meal. 06/23/15   Erline Hau, MD  potassium chloride SA (K-DUR,KLOR-CON) 20 MEQ tablet Take 20 mEq by mouth daily.      Historical Provider, MD  pregabalin (LYRICA) 50 MG capsule Take one capsule by mouth twice daily for pains Patient taking differently: Take 50 mg by mouth 3 (three) times daily.  08/23/14   Blanchie Serve, MD  ramipril (ALTACE) 2.5 MG capsule TAKE ONE CAPSULE BY MOUTH DAILY. 11/15/15   Troy Sine, MD  rosuvastatin (CRESTOR) 20 MG tablet TAKE (1) TABLET BY MOUTH AT BEDTIME FOR CHOLESTEROL. 08/18/15   [name], MD  sitaGLIPtin (JANUVIA) 50 MG tablet Take 50 mg by mouth daily.    Historical Provider, MD  vitamin C (ASCORBIC ACID) 500 MG tablet Take  500 mg by mouth  daily.    Historical Provider, MD  warfarin (COUMADIN) 2.5 MG tablet Use as directed 08/10/15   Historical Provider, MD  warfarin (COUMADIN) 5 MG tablet Take 1 tablet (5 mg total) by mouth daily at 6 PM. 10/14/15   Florencia Reasons, MD   BP 166/73 mmHg  Pulse 85  Temp(Src) 97.8 F (36.6 C) (Oral)  Resp 16  Ht '5\' 10"'$  (1.778 m)  Wt 215 lb (97.523 kg)  BMI 30.85 kg/m2  SpO2 94%  Vital signs normal except hypertension  Physical Exam  Constitutional: He is oriented to person, place, and time. He appears well-developed and well-nourished.  Non-toxic appearance. He does not appear ill. No distress.  HENT:  Head: Normocephalic and atraumatic.  Right Ear: External ear normal.  Left Ear: External ear normal.  Nose: Nose normal. No mucosal edema or rhinorrhea.  Mouth/Throat: Oropharynx is clear and moist and mucous membranes are normal. No dental abscesses or uvula swelling.  Eyes: Conjunctivae and EOM are normal. Pupils are equal, round, and reactive to light.  Neck: Normal range of motion and full passive range of motion without pain. Neck supple.  Cardiovascular: Normal rate, regular rhythm and normal heart sounds.  Exam reveals no gallop and no friction rub.   No murmur heard. Pulmonary/Chest: Effort normal. No accessory muscle usage. Tachypnea noted. No respiratory distress. He has decreased breath sounds. He has wheezes. He has no rhonchi. He has no rales. He exhibits no tenderness and no crepitus.  Abdominal: Soft. Normal appearance and bowel sounds are normal. He exhibits no distension. There is no tenderness. There is no rebound and no guarding.  Musculoskeletal: Normal range of motion. He exhibits edema. He exhibits no tenderness.  Moves all extremities well.  Lt BKA Swelling in LLE almost to knees 1 + pitting  Neurological: He is alert and oriented to person, place, and time. He has normal strength. No cranial nerve deficit.  Skin: Skin is warm, dry and intact. No rash noted. No erythema. No  pallor.  Psychiatric: He has a normal mood and affect. His speech is normal and behavior is normal. His mood appears not anxious.  Nursing note and vitals reviewed.   ED Course  Procedures (including critical care time)  Medications  albuterol (PROVENTIL,VENTOLIN) solution continuous neb ( Nebulization Canceled Entry 11/21/15 0255)  ipratropium (ATROVENT) nebulizer solution 1 mg ( Nebulization Canceled Entry 11/21/15 0254)  ipratropium (ATROVENT) 0.02 % nebulizer solution (  Given 11/21/15 0254)  albuterol (PROVENTIL, VENTOLIN) (5 MG/ML) 0.5% continuous inhalation solution (  Given 11/21/15 0254)  aspirin chewable tablet 324 mg (324 mg Oral Given 11/21/15 0455)  nitroGLYCERIN (NITROGLYN) 2 % ointment 1 inch (1 inch Topical Given 11/21/15 0500)  furosemide (LASIX) injection 80 mg (80 mg Intravenous Given 11/21/15 0455)  gi cocktail (Maalox,Lidocaine,Donnatal) (30 mLs Oral Given 11/21/15 0517)    Patient was started on a continuous nebulizer.  About 5 AM After reviewing patient's laboratory results and I reviewed his chest x-ray he appeared to have some increased polar vascularity on his x-ray. I ordered aspirin 324 mg orally Lasix 80 mg IV (patient takes 80 mg orally once a day) and nitroglycerin ointment 1 inch to his chest. I discussed some of these results with the patient. We discussed getting a repeat troponin troponin was mildly elevated. However when I review his troponins are usually positive.  About 5:15 AM the nurse came to me stating patient now is complaining of chest pain. Repeat EKG was done.  He was given a GI cocktail.  06:00 AM pt states his chest pain is gone after the NTG paste was placed. He has almost 900 cc of urine in his urinal after the lasix.   Patient was ambulated by nursing staff. His pulse ox remained 96% on room air. Patient state he was slightly short of breath.  I rechecked the patient at 6:45 AM. He states he's feeling much improved. He appears to be much improved. He has  no coughing. His wheezing is gone. He did not become hypoxic with exertion. He had very good urinary output from the Lasix. Look at his medications and he is on Imdur 60 mg a day. I was going to increase the dose however I feel would be better if he saw his cardiologist this week and let them look at his medications and decide if he needs any adjustments. Patient wants to be discharged. He was discharged with some extra Lasix to take until the edema is gone out of his left leg. He should return if he gets short of breath again.  Labs Review Results for orders placed or performed during the hospital encounter of 11/21/15  Comprehensive metabolic panel  Result Value Ref Range   Sodium 140 135 - 145 mmol/L   Potassium 4.2 3.5 - 5.1 mmol/L   Chloride 109 101 - 111 mmol/L   CO2 23 22 - 32 mmol/L   Glucose, Bld 274 (H) 65 - 99 mg/dL   BUN 20 6 - 20 mg/dL   Creatinine, Ser 1.12 0.61 - 1.24 mg/dL   Calcium 8.8 (L) 8.9 - 10.3 mg/dL   Total Protein 6.9 6.5 - 8.1 g/dL   Albumin 3.9 3.5 - 5.0 g/dL   AST 19 15 - 41 U/L   ALT 13 (L) 17 - 63 U/L   Alkaline Phosphatase 31 (L) 38 - 126 U/L   Total Bilirubin 0.3 0.3 - 1.2 mg/dL   GFR calc non Af Amer >60 >60 mL/min   GFR calc Af Amer >60 >60 mL/min   Anion gap 8 5 - 15  CBC with Differential  Result Value Ref Range   WBC 7.8 4.0 - 10.5 K/uL   RBC 3.92 (L) 4.22 - 5.81 MIL/uL   Hemoglobin 9.6 (L) 13.0 - 17.0 g/dL   HCT 31.3 (L) 39.0 - 52.0 %   MCV 79.8 78.0 - 100.0 fL   MCH 24.5 (L) 26.0 - 34.0 pg   MCHC 30.7 30.0 - 36.0 g/dL   RDW 18.9 (H) 11.5 - 15.5 %   Platelets 280 150 - 400 K/uL   Neutrophils Relative % 64 %   Neutro Abs 5.0 1.7 - 7.7 K/uL   Lymphocytes Relative 21 %   Lymphs Abs 1.6 0.7 - 4.0 K/uL   Monocytes Relative 9 %   Monocytes Absolute 0.7 0.1 - 1.0 K/uL   Eosinophils Relative 5 %   Eosinophils Absolute 0.4 0.0 - 0.7 K/uL   Basophils Relative 1 %   Basophils Absolute 0.1 0.0 - 0.1 K/uL  Troponin I  Result Value Ref Range    Troponin I 0.04 (H) <0.031 ng/mL  Brain natriuretic peptide  Result Value Ref Range   B Natriuretic Peptide 737.0 (H) 0.0 - 100.0 pg/mL  Protime-INR  Result Value Ref Range   Prothrombin Time 33.6 (H) 11.6 - 15.2 seconds   INR 3.40 (H) 0.00 - 1.49  Troponin I  Result Value Ref Range   Troponin I 0.04 (H) <0.031 ng/mL   Laboratory  interpretation all normal except mildly elevated BNP, anemia, positive troponin, hyperglycemia, persistent elevation of delta troponin     Imaging Review Dg Chest Port 1 View  11/21/2015  CLINICAL DATA:  Shortness of breath this morning. EXAM: PORTABLE CHEST 1 VIEW COMPARISON:  Most recent chest radiograph 08/07/2015. FINDINGS: Patient is post median sternotomy. Cardiomegaly is unchanged. There is unchanged elevation of left hemidiaphragm and left basilar opacity. Increased ill-defined and hazy opacity at the right lung base. Mild vascular congestion. No pneumothorax. IMPRESSION: Hazy opacity at the right lung base, may reflect combination of pleural effusion and atelectasis. Mild vascular congestion. Recommend correlation for CHF. Cardiomegaly is stable. Electronically Signed   By: Jeb Levering M.D.   On: 11/21/2015 04:49   I have personally reviewed and evaluated these images and lab results as part of my medical decision-making.   EKG Interpretation   Date/Time:  Tuesday November 21 2015 02:48:20 EDT Ventricular Rate:  94 PR Interval:  187 QRS Duration: 148 QT Interval:  391 QTC Calculation: 489 R Axis:   15 Text Interpretation:  Sinus or ectopic atrial rhythm Left bundle branch  block Since last tracing 04 Oct 2015  Premature ventricular complexes are  no longer Present Confirmed by Daveigh Batty  MD-I, Kaiel Weide ([id number]) on 11/21/2015  3:39:44 AM    #2 EKG  ED ECG REPORT   Date: 11/21/2015  Rate: 109  Rhythm: sinus tachycardia  QRS Axis: left  Intervals: normal and QT prolonged  ST/T Wave abnormalities: nonspecific ST/T changes  Conduction  Disutrbances:left bundle branch block  Narrative Interpretation: PVC's  Old EKG Reviewed: changes noted from earlier tonight, PVC's have returned  I have personally reviewed the EKG tracing and agree with the computerized printout as noted.   MDM   Final diagnoses:  Acute on chronic systolic congestive heart failure (HCC)  Bronchospasm    New Prescriptions   FUROSEMIDE (LASIX) 80 MG TABLET    Take 1 tablet (80 mg total) by mouth once as needed for fluid or edema.    Plan discharge  Rolland Porter, MD, Barbette Or, MD 11/21/15 [phone]

## 2015-11-29 ENCOUNTER — Ambulatory Visit
Admission: RE | Admit: 2015-11-29 | Discharge: 2015-11-29 | Disposition: A | Discharge status: Home / Self Care | Payer: Medicare Other | Source: Ambulatory Visit | Attending: Interventional Radiology | Admitting: Interventional Radiology

## 2015-11-29 ENCOUNTER — Telehealth: Payer: Self-pay | Admitting: Cardiovascular Disease

## 2015-11-29 DIAGNOSIS — R001 Bradycardia, unspecified: Secondary | ICD-10-CM | POA: Diagnosis not present

## 2015-11-29 DIAGNOSIS — K551 Chronic vascular disorders of intestine: Secondary | ICD-10-CM

## 2015-11-29 NOTE — Progress Notes (Signed)
Referring Physician(s): Wagner,Jaime  Chief Complaint: F/U of mesenteric artery angioplasty/stent  History of present illness:  Tanner Pena is here today for doppler surveillance of mesenteric artery angioplasty/stent done by Dr. Earleen Newport last July.  Chart reviewed.  He has had some hospital admissions due to GI bleeding.  He takes Warfarin for a St. June Aortic Valve.  He was on Plavix for his stent, but this has been discontinued due to the GI bleeding.  He is now on PPI BID and has had no further bleeding issues.  He denies any post prandial abdominal pain, he has no food fear.  He has gained some weight.  On doppler today, there were some elevated velocities in the distal artery ~ 400 cms/sec. He does have some calcifications present.  Past Medical History  Diagnosis Date  . Type 2 diabetes mellitus ([place]) 2007  . Essential hypertension   . Gout   . Hypercholesteremia   . Peripheral vascular disease ([place])     Lower extremity PCI/stenting  . COPD (chronic obstructive pulmonary disease) ([place])   . S/P aortic valve replacement 1990    a. St. Jude  . Chronic back pain   . Dysphagia   . Neuromuscular disorder ([place])   . Peripheral neuropathy ([place])   . Critical lower limb ischemia   . CAD (coronary artery disease)     a. 05/13/14 [name] s/p overlapping DESx2 to SVG to RCA. b.  s/p CABG '90 with redo '94 & stent to RCA SVG in 2005  . Chronic toe ulcer ([place])     a. Left foot  . Chronic systolic heart failure ([place])   . History of kidney stones   . GERD (gastroesophageal reflux disease)   . Arthritis   . Sleep apnea   . Myocardial infarction ([place])   . GI bleeding 05/31/2015    Source not identified.  . Acute blood loss anemia 02/2015 & 05/2015    Hemoglobin 5.5 on 05/31/15; status post transfusion.    Past Surgical History  Procedure Laterality Date  . Aortic valve replacement  1990    St. Jude  . Rotator cuff repair      right  . Cataract extraction      bilateral    . Coronary stent placement  2005    RCA vein graft A 3.0x13.0 TAXUS stent was then placed int he vessel a Viva 3.0x4.0 (perfusion balloon was made ready it was placed through the entire lenght of the stent  . Peripheral vascular procedures lower extremities      Right external iliac  artery PTA and stenting as well as bilateral SFA intervention remotely. Repeat procedures in 2011 bilaterally  . Coronary artery bypass graft  1994    6 vessels  . [name]  06/13/2011    Procedure: Venia Minks DILATION;  Surgeon: [name], MD;  Location: [facility];  Service: Endoscopy;  Laterality: N/A;  Dilated to 56.   . Angioplasty illiac artery    . Back surgery  [id number]    2  . Eye surgery    . Amputation Left 07/13/2014    Procedure: Transmetatarsal Amputation;  Surgeon: [name], MD;  Location: [place];  Service: Orthopedics;  Laterality: Left;  . Lower extremity angiogram N/A 02/15/2013    Procedure: LOWER EXTREMITY ANGIOGRAM;  Surgeon: [name], MD;  Location: [facility] CATH LAB;  Service: Cardiovascular;  Laterality: N/A;  . Left heart catheterization with coronary angiogram N/A 05/11/2014    Procedure: LEFT  HEART CATHETERIZATION WITH CORONARY ANGIOGRAM;  Surgeon: [name], MD;  Location: [facility] CATH LAB;  Service: Cardiovascular;  Laterality: N/A;  . Percutaneous coronary stent intervention (pci-s) N/A 05/13/2014    Procedure: PERCUTANEOUS CORONARY STENT INTERVENTION (PCI-S);  Surgeon: [name], MD;  Location: [facility] CATH LAB;  Service: Cardiovascular;  Laterality: N/A;  . Lower extremity angiogram N/A 06/06/2014    Procedure: LOWER EXTREMITY ANGIOGRAM;  Surgeon: [name], MD;  Location: [facility] CATH LAB;  Service: Cardiovascular;  Laterality: N/A;  . Amputation Left 08/20/2014    Procedure: Revision Transmetatarsal Amputation versus Below Knee Amputation;  Surgeon: [name], MD;  Location: [place];  Service: Orthopedics;  Laterality: Left;  . Stump revision Left  09/23/2014    Procedure: Revision Left Below Knee Amputation;  Surgeon: [name], MD;  Location: [place];  Service: Orthopedics;  Laterality: Left;  . Stump revision Left 10/13/2014    Procedure: REVISION LEFT BELOW KNEE AMPUTATION STUMP;  Surgeon: [name], MD;  Location: [facility];  Service: Orthopedics;  Laterality: Left;  . Esophagogastroduodenoscopy N/A 02/09/2015    DR. Schooler: Normal EGD  . Esophagogastroduodenoscopy  05/2011    Dr. [name]: s/p esophageal dilation, antral erosions/nodularity with benign biopsies  . Colonoscopy  05/2011    Dr. [name]: benign rectal polyp, left sided tics, ascending colonic ulcers (path c/w ischemia)  . Colonoscopy with propofol N/A 06/01/2015    RMR: normal appearing rectal mucosa. Scattered left-sided diverticula. the remainder of the colonic mucosa appeared normal. the distal 5 cm of terminal ileal mucosa also appeared normal. retroflexion was performed.   Freda Munro capsule study N/A 06/08/2015    Procedure: GIVENS CAPSULE STUDY;  Surgeon: [name], MD;  Location: [facility];  Service: Endoscopy;  Laterality: N/A;  0700   . Esophagogastroduodenoscopy (egd) with propofol N/A 06/20/2015    Procedure: ESOPHAGOGASTRODUODENOSCOPY (EGD) WITH PROPOFOL;  Surgeon: Danie Binder, MD;  Location: [facility];  Service: Endoscopy;  Laterality: N/A;  . Biopsy N/A 06/20/2015    Procedure: BIOPSY;  Surgeon: Danie Binder, MD;  Location: [facility];  Service: Endoscopy;  Laterality: N/A;  . Esophagogastroduodenoscopy N/A 10/04/2015    Procedure: ESOPHAGOGASTRODUODENOSCOPY (EGD);  Surgeon: Doran Stabler, MD;  Location: [facility] ENDOSCOPY;  Service: Endoscopy;  Laterality: N/A;    Allergies: Review of patient's allergies indicates no known allergies.  Medications: Prior to Admission medications   Medication Sig Start Date End Date Taking? Authorizing Provider  albuterol (PROVENTIL) 4 MG tablet Take 4 mg by mouth 3 (three) times daily. 01/20/15   Historical  Provider, MD  albuterol-ipratropium (COMBIVENT) 18-103 MCG/ACT inhaler Inhale 1 puff into the lungs 4 (four) times daily. Coughing/ Shortness of Breath    Historical Provider, MD  allopurinol (ZYLOPRIM) 300 MG tablet Take 300 mg by mouth daily.    Historical Provider, MD  CARAFATE 1 GM/10ML suspension TAKE 10 ML BY MOUTH FOUR TIMES DAILY WITH MEALS AND AT BEDTIME. 08/30/15   Troy Sine, MD  cholecalciferol (VITAMIN D) 1000 UNITS tablet Take 2,000 Units by mouth daily.    Historical Provider, MD  fenofibrate (TRICOR) 145 MG tablet TAKE 1 TABLET BY MOUTH ONCE DAILY FOR CHOLESTEROL. 08/18/15   [name], MD  ferrous sulfate 325 (65 FE) MG EC tablet Take 1 tablet (325 mg total) by mouth daily with breakfast. 06/02/15   Rexene Alberts, MD  fish oil-omega-3 fatty acids 1000 MG capsule Take 1 capsule (1 g total) by mouth  2 (two) times daily. 01/22/13   Casimiro Needle Alvstad, RPH-CPP  furosemide (LASIX) 80 MG tablet Take 0.5 tablets (40 mg total) by mouth daily. 10/14/15   Florencia Reasons, MD  furosemide (LASIX) 80 MG tablet Take 1 tablet (80 mg total) by mouth once as needed for fluid or edema. 11/21/15   Rolland Porter, MD  glimepiride (AMARYL) 1 MG tablet Take 1 mg by mouth 2 (two) times daily.    Historical Provider, MD  isosorbide mononitrate (IMDUR) 60 MG 24 hr tablet Take 1 tablet (60 mg total) by mouth daily. 07/27/15   Troy Sine, MD  metoprolol succinate (TOPROL-XL) 25 MG 24 hr tablet Take 1 tablet in the morning and 1/2 tablet in the evening 11/07/15   Troy Sine, MD  NITROSTAT 0.4 MG SL tablet PLACE 1 TAB UNDER TONGUE EVERY 5 MIN IF NEEDED FOR CHEST PAIN. MAY USE 3 TIMES.NO RELIEF CALL 911. 07/20/15   Eileen Stanford, PA-C  oxyCODONE-acetaminophen (PERCOCET) 10-325 MG per tablet Take 1 tablet by mouth every 4 (four) hours as needed for pain. Patient taking differently: Take 1 tablet by mouth every 3 (three) hours as needed for pain.  09/23/14   [name], MD  pantoprazole (PROTONIX) 40 MG tablet  Take 1 tablet (40 mg total) by mouth 2 (two) times daily before a meal. 06/23/15   Erline Hau, MD  potassium chloride SA (K-DUR,KLOR-CON) 20 MEQ tablet Take 20 mEq by mouth daily.      Historical Provider, MD  pregabalin (LYRICA) 50 MG capsule Take one capsule by mouth twice daily for pains Patient taking differently: Take 50 mg by mouth 3 (three) times daily.  08/23/14   Blanchie Serve, MD  ramipril (ALTACE) 2.5 MG capsule TAKE ONE CAPSULE BY MOUTH DAILY. 11/15/15   Troy Sine, MD  rosuvastatin (CRESTOR) 20 MG tablet TAKE (1) TABLET BY MOUTH AT BEDTIME FOR CHOLESTEROL. 08/18/15   [name], MD  sitaGLIPtin (JANUVIA) 50 MG tablet Take 50 mg by mouth daily.    Historical Provider, MD  vitamin C (ASCORBIC ACID) 500 MG tablet Take 500 mg by mouth daily.    Historical Provider, MD  warfarin (COUMADIN) 2.5 MG tablet Use as directed 08/10/15   Historical Provider, MD  warfarin (COUMADIN) 5 MG tablet Take 1 tablet (5 mg total) by mouth daily at 6 PM. 10/14/15   Florencia Reasons, MD     Family History  Problem Relation Age of Onset  . Colon cancer Neg Hx   . Liver disease Neg Hx   . Diabetes Father   . Heart failure Other     Social History   Social History  . Marital Status: Legally Separated    Spouse Name: N/A  . Number of Children: 5  . Years of Education: N/A   Occupational History  . retired     Stage manager   Social History Main Topics  . Smoking status: Former Smoker -- 1.00 packs/day for 55 years    Types: Cigarettes    Start date: 08/19/1953    Quit date: 05/07/2014  . Smokeless tobacco: Current User    Types: Chew    Last Attempt to Quit: 08/20/1995     Comment: Has quit on 3 occasions. Counseling given today 5-10 minutes   I am more than likely going to quit "  . Alcohol Use: No     Comment: Socially, sometimes 12 ounce beer daily, may go month without/no whiskey  . Drug  Use: No  . Sexual Activity: Not Currently   Other Topics Concern  . Not on file    Social History Narrative   Has 3 daughters   Has 2 sons     Vital Signs: BP 136/52 mmHg  Pulse 33  Temp(Src) 97.7 F (36.5 C) (Oral)  Resp 14  Ht '5\' 10"'$  (1.778 m)  Wt 214 lb 6.4 oz (97.251 kg)  BMI 30.76 kg/m2  SpO2 98%  Physical Exam  Constitutional: He is oriented to person, place, and time. He appears well-developed and well-nourished.  Cardiovascular:  Profound bradycardia = 30-35 bmp  Pulmonary/Chest: Effort normal and breath sounds normal. No respiratory distress.  Abdominal: Soft. Bowel sounds are normal.  Neurological: He is alert and oriented to person, place, and time.  Vitals reviewed.   Imaging: No results found.  Labs:  CBC:  Recent Labs  10/12/15 0330 10/13/15 0332 10/14/15 0421 11/21/15 0340  WBC 6.6 6.1 5.9 7.8  HGB 9.0* 9.1* 8.9* 9.6*  HCT 29.0* 29.1* 29.7* 31.3*  PLT 242 263 280 280    COAGS:  Recent Labs  02/04/15 0415  11/07/15 11/16/15 11/16/15 1027 11/21/15 0342  INR 2.62*  < > 4.5 3.6* 3.6* 3.40*  APTT 36  --   --   --   --   --   < > = values in this interval not displayed.  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 Earleen Newport.  Doing very well with some weight gain.  We have had some difficulty visualizing the stent on doppler due to bowel gas.  Since he is asymptomatic, we will have him return in 6 months with clinic visit only, no  imaging per Dr. Earleen Newport   GI bleed = currently resolved. Agree with stopping Plavix and aspirin and remain on coumadin for valve.  Bradycardia = Spoke with Trish with Cardiology. Patient is to call the nurse triage number at the office ASAP for further instructions. This number was given to patient.  Signed: Murrell Redden PA-C 11/29/2015, 11:36 AM   Please refer to Dr. Pasty Arch attestation of this note for management and plan.

## 2015-11-29 NOTE — Telephone Encounter (Signed)
Home number goes to VM. Left msg to call.

## 2015-11-29 NOTE — Telephone Encounter (Signed)
Follow Up  Pt called states that he is in the hospital and was advised to come in today (sameday) for a follow up with Dr. Loletha Grayer. Please call back to discuss.

## 2015-11-30 ENCOUNTER — Other Ambulatory Visit: Payer: Medicare Other

## 2015-12-01 NOTE — Telephone Encounter (Signed)
Returned pt call. He could not remember why he called. Asked him if he had any f/u appt scheduled since he was d/c'd from ED. He said yes this coming Monday, 12/04/15 with Dr Chapman Moss. Denies having any complaints at this time.

## 2015-12-06 ENCOUNTER — Emergency Department (HOSPITAL_COMMUNITY): Payer: Medicare Other

## 2015-12-06 ENCOUNTER — Encounter (HOSPITAL_COMMUNITY): Payer: Self-pay | Admitting: Emergency Medicine

## 2015-12-06 ENCOUNTER — Inpatient Hospital Stay (HOSPITAL_COMMUNITY)
Admission: EM | Admit: 2015-12-06 | Discharge: 2015-12-07 | DRG: 291 | Disposition: A | Discharge status: Home / Self Care | Payer: Medicare Other | Attending: Internal Medicine | Admitting: Internal Medicine

## 2015-12-06 DIAGNOSIS — Z833 Family history of diabetes mellitus: Secondary | ICD-10-CM

## 2015-12-06 DIAGNOSIS — I509 Heart failure, unspecified: Secondary | ICD-10-CM

## 2015-12-06 DIAGNOSIS — Z79891 Long term (current) use of opiate analgesic: Secondary | ICD-10-CM | POA: Diagnosis not present

## 2015-12-06 DIAGNOSIS — K219 Gastro-esophageal reflux disease without esophagitis: Secondary | ICD-10-CM | POA: Diagnosis present

## 2015-12-06 DIAGNOSIS — Z8249 Family history of ischemic heart disease and other diseases of the circulatory system: Secondary | ICD-10-CM | POA: Diagnosis not present

## 2015-12-06 DIAGNOSIS — E8729 Other acidosis: Secondary | ICD-10-CM

## 2015-12-06 DIAGNOSIS — Z954 Presence of other heart-valve replacement: Secondary | ICD-10-CM | POA: Diagnosis not present

## 2015-12-06 DIAGNOSIS — E872 Acidosis: Secondary | ICD-10-CM | POA: Diagnosis not present

## 2015-12-06 DIAGNOSIS — Q2730 Arteriovenous malformation, site unspecified: Secondary | ICD-10-CM

## 2015-12-06 DIAGNOSIS — E785 Hyperlipidemia, unspecified: Secondary | ICD-10-CM | POA: Diagnosis present

## 2015-12-06 DIAGNOSIS — Z7984 Long term (current) use of oral hypoglycemic drugs: Secondary | ICD-10-CM

## 2015-12-06 DIAGNOSIS — I110 Hypertensive heart disease with heart failure: Secondary | ICD-10-CM | POA: Diagnosis not present

## 2015-12-06 DIAGNOSIS — T380X5A Adverse effect of glucocorticoids and synthetic analogues, initial encounter: Secondary | ICD-10-CM | POA: Diagnosis present

## 2015-12-06 DIAGNOSIS — Z89511 Acquired absence of right leg below knee: Secondary | ICD-10-CM | POA: Diagnosis not present

## 2015-12-06 DIAGNOSIS — Z952 Presence of prosthetic heart valve: Secondary | ICD-10-CM | POA: Diagnosis not present

## 2015-12-06 DIAGNOSIS — I5023 Acute on chronic systolic (congestive) heart failure: Secondary | ICD-10-CM | POA: Diagnosis present

## 2015-12-06 DIAGNOSIS — I200 Unstable angina: Secondary | ICD-10-CM

## 2015-12-06 DIAGNOSIS — Z951 Presence of aortocoronary bypass graft: Secondary | ICD-10-CM | POA: Diagnosis not present

## 2015-12-06 DIAGNOSIS — R0602 Shortness of breath: Secondary | ICD-10-CM | POA: Diagnosis not present

## 2015-12-06 DIAGNOSIS — E119 Type 2 diabetes mellitus without complications: Secondary | ICD-10-CM | POA: Diagnosis not present

## 2015-12-06 DIAGNOSIS — J441 Chronic obstructive pulmonary disease with (acute) exacerbation: Secondary | ICD-10-CM | POA: Diagnosis present

## 2015-12-06 DIAGNOSIS — I739 Peripheral vascular disease, unspecified: Secondary | ICD-10-CM | POA: Diagnosis present

## 2015-12-06 DIAGNOSIS — G4730 Sleep apnea, unspecified: Secondary | ICD-10-CM | POA: Diagnosis present

## 2015-12-06 DIAGNOSIS — Z7951 Long term (current) use of inhaled steroids: Secondary | ICD-10-CM

## 2015-12-06 DIAGNOSIS — R05 Cough: Secondary | ICD-10-CM | POA: Diagnosis not present

## 2015-12-06 DIAGNOSIS — E1122 Type 2 diabetes mellitus with diabetic chronic kidney disease: Secondary | ICD-10-CM

## 2015-12-06 DIAGNOSIS — Z955 Presence of coronary angioplasty implant and graft: Secondary | ICD-10-CM

## 2015-12-06 DIAGNOSIS — K551 Chronic vascular disorders of intestine: Secondary | ICD-10-CM | POA: Diagnosis present

## 2015-12-06 DIAGNOSIS — J9600 Acute respiratory failure, unspecified whether with hypoxia or hypercapnia: Secondary | ICD-10-CM | POA: Diagnosis not present

## 2015-12-06 DIAGNOSIS — E7800 Pure hypercholesterolemia, unspecified: Secondary | ICD-10-CM | POA: Diagnosis present

## 2015-12-06 DIAGNOSIS — F17220 Nicotine dependence, chewing tobacco, uncomplicated: Secondary | ICD-10-CM | POA: Diagnosis present

## 2015-12-06 DIAGNOSIS — I252 Old myocardial infarction: Secondary | ICD-10-CM

## 2015-12-06 DIAGNOSIS — I2510 Atherosclerotic heart disease of native coronary artery without angina pectoris: Secondary | ICD-10-CM | POA: Diagnosis present

## 2015-12-06 DIAGNOSIS — R069 Unspecified abnormalities of breathing: Secondary | ICD-10-CM | POA: Diagnosis not present

## 2015-12-06 DIAGNOSIS — Z7901 Long term (current) use of anticoagulants: Secondary | ICD-10-CM

## 2015-12-06 LAB — BLOOD GAS, ARTERIAL
ACID-BASE DEFICIT: 3.1 mmol/L — AB (ref 0.0–2.0)
ACID-BASE DEFICIT: 2 mmol/L (ref 0.0–2.0)
BICARBONATE: 22.9 meq/L (ref 20.0–24.0)
Bicarbonate: 21.2 mEq/L (ref 20.0–24.0)
Delivery systems: POSITIVE
Drawn by: 23534
Drawn by: 235341
Expiratory PAP: 8
FIO2: 0.35
INSPIRATORY PAP: 16
O2 Content: 100 L/min
O2 Saturation: 97.6 %
O2 Saturation: 98.5 %
PCO2 ART: 51.6 mmHg — AB (ref 35.0–45.0)
PH ART: 7.268 — AB (ref 7.350–7.450)
PO2 ART: 115 mmHg — AB (ref 80.0–100.0)
RATE: 12 resp/min
pCO2 arterial: 35.9 mmHg (ref 35.0–45.0)
pH, Arterial: 7.405 (ref 7.350–7.450)
pO2, Arterial: 115 mmHg — ABNORMAL HIGH (ref 80.0–100.0)

## 2015-12-06 LAB — CBC WITH DIFFERENTIAL/PLATELET
BASOS ABS: 0.1 10*3/uL (ref 0.0–0.1)
BASOS PCT: 1 %
EOS ABS: 0.6 10*3/uL (ref 0.0–0.7)
EOS PCT: 6 %
HCT: 34.5 % — ABNORMAL LOW (ref 39.0–52.0)
HEMOGLOBIN: 10.5 g/dL — AB (ref 13.0–17.0)
Lymphocytes Relative: 31 %
Lymphs Abs: 3.2 10*3/uL (ref 0.7–4.0)
MCH: 24 pg — AB (ref 26.0–34.0)
MCHC: 30.4 g/dL (ref 30.0–36.0)
MCV: 78.8 fL (ref 78.0–100.0)
MONO ABS: 0.8 10*3/uL (ref 0.1–1.0)
Monocytes Relative: 8 %
Neutro Abs: 5.5 10*3/uL (ref 1.7–7.7)
Neutrophils Relative %: 54 %
PLATELETS: ADEQUATE 10*3/uL (ref 150–400)
RBC: 4.38 MIL/uL (ref 4.22–5.81)
RDW: 19.2 % — AB (ref 11.5–15.5)
WBC: 10.2 10*3/uL (ref 4.0–10.5)

## 2015-12-06 LAB — BASIC METABOLIC PANEL
ANION GAP: 8 (ref 5–15)
Anion gap: 10 (ref 5–15)
BUN: 35 mg/dL — AB (ref 6–20)
BUN: 35 mg/dL — ABNORMAL HIGH (ref 6–20)
CALCIUM: 9.6 mg/dL (ref 8.9–10.3)
CALCIUM: 9.5 mg/dL (ref 8.9–10.3)
CHLORIDE: 109 mmol/L (ref 101–111)
CO2: 23 mmol/L (ref 22–32)
CO2: 25 mmol/L (ref 22–32)
CREATININE: 1.61 mg/dL — AB (ref 0.61–1.24)
CREATININE: 1.57 mg/dL — AB (ref 0.61–1.24)
Chloride: 109 mmol/L (ref 101–111)
GFR calc Af Amer: 47 mL/min — ABNORMAL LOW (ref >60)
GFR calc non Af Amer: 40 mL/min — ABNORMAL LOW (ref >60)
GFR, EST AFRICAN AMERICAN: 48 mL/min — AB (ref >60)
GFR, EST NON AFRICAN AMERICAN: 41 mL/min — AB (ref >60)
Glucose, Bld: 312 mg/dL — ABNORMAL HIGH (ref 65–99)
Glucose, Bld: 340 mg/dL — ABNORMAL HIGH (ref 65–99)
Potassium: 4.9 mmol/L (ref 3.5–5.1)
Potassium: 4.9 mmol/L (ref 3.5–5.1)
SODIUM: 142 mmol/L (ref 135–145)
Sodium: 142 mmol/L (ref 135–145)

## 2015-12-06 LAB — GLUCOSE, CAPILLARY
GLUCOSE-CAPILLARY: 425 mg/dL — AB (ref 65–99)
GLUCOSE-CAPILLARY: 364 mg/dL — AB (ref 65–99)
GLUCOSE-CAPILLARY: 399 mg/dL — AB (ref 65–99)
GLUCOSE-CAPILLARY: 374 mg/dL — AB (ref 65–99)
Glucose-Capillary: 330 mg/dL — ABNORMAL HIGH (ref 65–99)

## 2015-12-06 LAB — PROTIME-INR
INR: 2.34 — ABNORMAL HIGH (ref 0.00–1.49)
PROTHROMBIN TIME: 25.4 s — AB (ref 11.6–15.2)

## 2015-12-06 LAB — CBC
HCT: 32.8 % — ABNORMAL LOW (ref 39.0–52.0)
Hemoglobin: 9.9 g/dL — ABNORMAL LOW (ref 13.0–17.0)
MCH: 24 pg — AB (ref 26.0–34.0)
MCHC: 30.2 g/dL (ref 30.0–36.0)
MCV: 79.6 fL (ref 78.0–100.0)
PLATELETS: 289 10*3/uL (ref 150–400)
RBC: 4.12 MIL/uL — AB (ref 4.22–5.81)
RDW: 19.2 % — ABNORMAL HIGH (ref 11.5–15.5)
WBC: 9.5 10*3/uL (ref 4.0–10.5)

## 2015-12-06 LAB — TROPONIN I
TROPONIN I: 0.07 ng/mL — AB (ref <0.031)
TROPONIN I: 0.07 ng/mL — AB (ref <0.031)
TROPONIN I: 0.09 ng/mL — AB (ref <0.031)
TROPONIN I: 0.1 ng/mL — AB (ref <0.031)

## 2015-12-06 LAB — MRSA PCR SCREENING: MRSA BY PCR: NEGATIVE

## 2015-12-06 LAB — BRAIN NATRIURETIC PEPTIDE: B Natriuretic Peptide: 648 pg/mL — ABNORMAL HIGH (ref 0.0–100.0)

## 2015-12-06 MED ORDER — ONDANSETRON HCL 4 MG PO TABS: 4.0000 mg | ORAL_TABLET | Freq: Four times a day (QID) | ORAL | Status: DC | PRN

## 2015-12-06 MED ORDER — SODIUM CHLORIDE 0.9% FLUSH: 3.0000 mL | INTRAVENOUS | Status: DC | PRN

## 2015-12-06 MED ORDER — IPRATROPIUM-ALBUTEROL 0.5-2.5 (3) MG/3ML IN SOLN: 3.0000 mL | Freq: Four times a day (QID) | RESPIRATORY_TRACT | Status: DC

## 2015-12-06 MED ORDER — INSULIN ASPART 100 UNIT/ML ~~LOC~~ SOLN
0.0000 [IU] | SUBCUTANEOUS | Status: DC
  Administered 2015-12-06 (×4): 9 [IU] via SUBCUTANEOUS

## 2015-12-06 MED ORDER — AZITHROMYCIN 250 MG PO TABS
250.0000 mg | ORAL_TABLET | Freq: Every day | ORAL | Status: DC
  Administered 2015-12-07: 250 mg via ORAL
  Filled 2015-12-06: qty 1

## 2015-12-06 MED ORDER — SODIUM CHLORIDE 0.9 % IV SOLN: 250.0000 mL | INTRAVENOUS | Status: DC | PRN

## 2015-12-06 MED ORDER — WARFARIN SODIUM 5 MG PO TABS
5.0000 mg | ORAL_TABLET | Freq: Every day | ORAL | Status: DC
  Administered 2015-12-07: 5 mg via ORAL
  Filled 2015-12-06: qty 1

## 2015-12-06 MED ORDER — INSULIN ASPART 100 UNIT/ML ~~LOC~~ SOLN
0.0000 [IU] | Freq: Three times a day (TID) | SUBCUTANEOUS | Status: DC
  Administered 2015-12-07: 9 [IU] via SUBCUTANEOUS
  Administered 2015-12-07: 7 [IU] via SUBCUTANEOUS

## 2015-12-06 MED ORDER — OXYCODONE-ACETAMINOPHEN 5-325 MG PO TABS
1.0000 | ORAL_TABLET | Freq: Four times a day (QID) | ORAL | Status: AC | PRN
  Administered 2015-12-06 – 2015-12-07 (×2): 1 via ORAL
  Filled 2015-12-06 (×2): qty 1

## 2015-12-06 MED ORDER — IPRATROPIUM BROMIDE 0.02 % IN SOLN
0.5000 mg | Freq: Once | RESPIRATORY_TRACT | Status: AC
  Administered 2015-12-06: 0.5 mg via RESPIRATORY_TRACT
  Filled 2015-12-06: qty 2.5

## 2015-12-06 MED ORDER — IPRATROPIUM-ALBUTEROL 0.5-2.5 (3) MG/3ML IN SOLN
3.0000 mL | Freq: Four times a day (QID) | RESPIRATORY_TRACT | Status: DC
  Administered 2015-12-06 – 2015-12-07 (×6): 3 mL via RESPIRATORY_TRACT
  Filled 2015-12-06 (×6): qty 3

## 2015-12-06 MED ORDER — IPRATROPIUM BROMIDE 0.02 % IN SOLN: 0.5000 mg | Freq: Four times a day (QID) | RESPIRATORY_TRACT | Status: DC

## 2015-12-06 MED ORDER — ONDANSETRON HCL 4 MG/2ML IJ SOLN: 4.0000 mg | Freq: Four times a day (QID) | INTRAMUSCULAR | Status: DC | PRN

## 2015-12-06 MED ORDER — FUROSEMIDE 10 MG/ML IJ SOLN
40.0000 mg | Freq: Two times a day (BID) | INTRAMUSCULAR | Status: AC
  Administered 2015-12-06 – 2015-12-07 (×4): 40 mg via INTRAVENOUS
  Filled 2015-12-06 (×4): qty 4

## 2015-12-06 MED ORDER — WARFARIN - PHARMACIST DOSING INPATIENT: Freq: Every day | Status: DC

## 2015-12-06 MED ORDER — FUROSEMIDE 10 MG/ML IJ SOLN
40.0000 mg | Freq: Once | INTRAMUSCULAR | Status: AC
  Administered 2015-12-06: 40 mg via INTRAVENOUS
  Filled 2015-12-06: qty 4

## 2015-12-06 MED ORDER — WARFARIN SODIUM 5 MG PO TABS
5.0000 mg | ORAL_TABLET | Freq: Once | ORAL | Status: AC
  Administered 2015-12-06: 5 mg via ORAL
  Filled 2015-12-06: qty 1

## 2015-12-06 MED ORDER — ALBUTEROL SULFATE (2.5 MG/3ML) 0.083% IN NEBU: 2.5000 mg | INHALATION_SOLUTION | RESPIRATORY_TRACT | Status: DC | PRN

## 2015-12-06 MED ORDER — ASPIRIN 81 MG PO CHEW
324.0000 mg | CHEWABLE_TABLET | Freq: Once | ORAL | Status: AC
  Administered 2015-12-06: 324 mg via ORAL
  Filled 2015-12-06: qty 4

## 2015-12-06 MED ORDER — GUAIFENESIN ER 600 MG PO TB12
600.0000 mg | ORAL_TABLET | Freq: Two times a day (BID) | ORAL | Status: DC
  Administered 2015-12-06 – 2015-12-07 (×3): 600 mg via ORAL
  Filled 2015-12-06 (×3): qty 1

## 2015-12-06 MED ORDER — INSULIN ASPART 100 UNIT/ML ~~LOC~~ SOLN
6.0000 [IU] | Freq: Once | SUBCUTANEOUS | Status: AC
  Administered 2015-12-06: 6 [IU] via SUBCUTANEOUS

## 2015-12-06 MED ORDER — METHYLPREDNISOLONE SODIUM SUCC 125 MG IJ SOLR
80.0000 mg | Freq: Two times a day (BID) | INTRAMUSCULAR | Status: DC
  Administered 2015-12-06 (×2): 80 mg via INTRAVENOUS
  Filled 2015-12-06 (×2): qty 2

## 2015-12-06 MED ORDER — METHYLPREDNISOLONE SODIUM SUCC 125 MG IJ SOLR
125.0000 mg | Freq: Once | INTRAMUSCULAR | Status: AC
  Administered 2015-12-06: 125 mg via INTRAVENOUS
  Filled 2015-12-06: qty 2

## 2015-12-06 MED ORDER — AZITHROMYCIN 250 MG PO TABS
500.0000 mg | ORAL_TABLET | Freq: Every day | ORAL | Status: AC
  Administered 2015-12-06: 500 mg via ORAL
  Filled 2015-12-06: qty 2

## 2015-12-06 MED ORDER — NITROGLYCERIN 0.4 MG SL SUBL
0.4000 mg | SUBLINGUAL_TABLET | SUBLINGUAL | Status: DC | PRN
  Administered 2015-12-06 – 2015-12-07 (×2): 0.4 mg via SUBLINGUAL
  Filled 2015-12-06: qty 1

## 2015-12-06 MED ORDER — ALBUTEROL (5 MG/ML) CONTINUOUS INHALATION SOLN
15.0000 mg/h | INHALATION_SOLUTION | Freq: Once | RESPIRATORY_TRACT | Status: AC
  Administered 2015-12-06: 15 mg/h via RESPIRATORY_TRACT
  Filled 2015-12-06: qty 20

## 2015-12-06 MED ORDER — SODIUM CHLORIDE 0.9% FLUSH
3.0000 mL | Freq: Two times a day (BID) | INTRAVENOUS | Status: DC
  Administered 2015-12-06 – 2015-12-07 (×3): 3 mL via INTRAVENOUS

## 2015-12-06 NOTE — ED Provider Notes (Signed)
By signing my name below, I, Nicole Kindred, attest that this documentation has been prepared under the direction and in the presence of [place], DO.   Electronically Signed: Nicole Kindred, ED Scribe. 12/06/2015. 1:27 AM   TIME SEEN: 1:21 AM  CHIEF COMPLAINT: Chest Pain  HPI: HPI Comments: Tanner Pena is a 75 y.o. male with PMHx of COPD, hypertension, diabetes, hyperlipidemia, aortic valve replacement on Coumadin who presents to the Emergency Department complaining of gradual onset, worsening, shortness of breath, beginning earlier tonight. Pt reports associated chest pain and productive cough with white phlegm. He describes the pain as a diffuse tightness without radiation. No other associated symptoms noted. No worsening or alleviating factors noted. Pt denies fever, or any other pertinent symptoms. Pt quit smoking approximately two years ago.    ROS: See HPI Constitutional: no fever  Eyes: no drainage  ENT: no runny nose   Cardiovascular:   Chest pain Resp: Shortness of breath GI: no vomiting GU: no dysuria Integumentary: no rash  Allergy: no hives  Musculoskeletal: no leg swelling  Neurological: no slurred speech ROS otherwise negative  PAST MEDICAL HISTORY/PAST SURGICAL HISTORY:  Past Medical History  Diagnosis Date  . Type 2 diabetes mellitus ([place]) 2007  . Essential hypertension   . Gout   . Hypercholesteremia   . Peripheral vascular disease ([place])     Lower extremity PCI/stenting  . COPD (chronic obstructive pulmonary disease) ([place])   . S/P aortic valve replacement 1990    a. St. Jude  . Chronic back pain   . Dysphagia   . Neuromuscular disorder ([place])   . Peripheral neuropathy ([place])   . Critical lower limb ischemia   . CAD (coronary artery disease)     a. 05/13/14 [name] s/p overlapping DESx2 to SVG to RCA. b.  s/p CABG '90 with redo '94 & stent to RCA SVG in 2005  . Chronic toe ulcer ([place])     a. Left foot  . Chronic systolic heart failure ([place])    . History of kidney stones   . GERD (gastroesophageal reflux disease)   . Arthritis   . Sleep apnea   . Myocardial infarction ([place])   . GI bleeding 05/31/2015    Source not identified.  . Acute blood loss anemia 02/2015 & 05/2015    Hemoglobin 5.5 on 05/31/15; status post transfusion.    MEDICATIONS:  Prior to Admission medications   Medication Sig Start Date End Date Taking? Authorizing Provider  albuterol (PROVENTIL) 4 MG tablet Take 4 mg by mouth 3 (three) times daily. 01/20/15   Historical Provider, MD  albuterol-ipratropium (COMBIVENT) 18-103 MCG/ACT inhaler Inhale 1 puff into the lungs 4 (four) times daily. Coughing/ Shortness of Breath    Historical Provider, MD  allopurinol (ZYLOPRIM) 300 MG tablet Take 300 mg by mouth daily.    Historical Provider, MD  CARAFATE 1 GM/10ML suspension TAKE 10 ML BY MOUTH FOUR TIMES DAILY WITH MEALS AND AT BEDTIME. 08/30/15   Troy Sine, MD  cholecalciferol (VITAMIN D) 1000 UNITS tablet Take 2,000 Units by mouth daily.    Historical Provider, MD  fenofibrate (TRICOR) 145 MG tablet TAKE 1 TABLET BY MOUTH ONCE DAILY FOR CHOLESTEROL. 08/18/15   [name], MD  ferrous sulfate 325 (65 FE) MG EC tablet Take 1 tablet (325 mg total) by mouth daily with breakfast. 06/02/15   Rexene Alberts, MD  fish oil-omega-3 fatty acids 1000 MG capsule Take 1 capsule (1 g total) by mouth 2 (  two) times daily. 01/22/13   Casimiro Needle Alvstad, RPH-CPP  furosemide (LASIX) 80 MG tablet Take 0.5 tablets (40 mg total) by mouth daily. 10/14/15   Florencia Reasons, MD  furosemide (LASIX) 80 MG tablet Take 1 tablet (80 mg total) by mouth once as needed for fluid or edema. 11/21/15   Rolland Porter, MD  glimepiride (AMARYL) 1 MG tablet Take 1 mg by mouth 2 (two) times daily.    Historical Provider, MD  isosorbide mononitrate (IMDUR) 60 MG 24 hr tablet Take 1 tablet (60 mg total) by mouth daily. 07/27/15   Troy Sine, MD  metoprolol succinate (TOPROL-XL) 25 MG 24 hr tablet Take 1 tablet in the  morning and 1/2 tablet in the evening 11/07/15   Troy Sine, MD  NITROSTAT 0.4 MG SL tablet PLACE 1 TAB UNDER TONGUE EVERY 5 MIN IF NEEDED FOR CHEST PAIN. MAY USE 3 TIMES.NO RELIEF CALL 911. 07/20/15   Eileen Stanford, PA-C  oxyCODONE-acetaminophen (PERCOCET) 10-325 MG per tablet Take 1 tablet by mouth every 4 (four) hours as needed for pain. Patient taking differently: Take 1 tablet by mouth every 3 (three) hours as needed for pain.  09/23/14   [name], MD  pantoprazole (PROTONIX) 40 MG tablet Take 1 tablet (40 mg total) by mouth 2 (two) times daily before a meal. 06/23/15   Erline Hau, MD  potassium chloride SA (K-DUR,KLOR-CON) 20 MEQ tablet Take 20 mEq by mouth daily.      Historical Provider, MD  pregabalin (LYRICA) 50 MG capsule Take one capsule by mouth twice daily for pains Patient taking differently: Take 50 mg by mouth 3 (three) times daily.  08/23/14   Blanchie Serve, MD  ramipril (ALTACE) 2.5 MG capsule TAKE ONE CAPSULE BY MOUTH DAILY. 11/15/15   Troy Sine, MD  rosuvastatin (CRESTOR) 20 MG tablet TAKE (1) TABLET BY MOUTH AT BEDTIME FOR CHOLESTEROL. 08/18/15   [name], MD  sitaGLIPtin (JANUVIA) 50 MG tablet Take 50 mg by mouth daily.    Historical Provider, MD  vitamin C (ASCORBIC ACID) 500 MG tablet Take 500 mg by mouth daily.    Historical Provider, MD  warfarin (COUMADIN) 2.5 MG tablet Use as directed 08/10/15   Historical Provider, MD  warfarin (COUMADIN) 5 MG tablet Take 1 tablet (5 mg total) by mouth daily at 6 PM. 10/14/15   Florencia Reasons, MD    ALLERGIES:  No Known Allergies  SOCIAL HISTORY:  Social History  Substance Use Topics  . Smoking status: Former Smoker -- 1.00 packs/day for 55 years    Types: Cigarettes    Start date: 08/19/1953    Quit date: 05/07/2014  . Smokeless tobacco: Current User    Types: Chew    Last Attempt to Quit: 08/20/1995     Comment: Has quit on 3 occasions. Counseling given today 5-10 minutes   I am more than likely  going to quit "  . Alcohol Use: No     Comment: Socially, sometimes 12 ounce beer daily, may go month without/no whiskey    FAMILY HISTORY: Family History  Problem Relation Age of Onset  . Colon cancer Neg Hx   . Liver disease Neg Hx   . Diabetes Father   . Heart failure Other     EXAM: Ht '5\' 10"'$  (1.778 m)  Wt 220 lb (99.791 kg)  BMI 31.57 kg/m2 CONSTITUTIONAL: Alert and oriented and responds appropriately to questions. Elderly, chronically ill appearing, moderate respiratory distress  HEAD: Normocephalic EYES: Conjunctivae clear, PERRL ENT: normal nose; no rhinorrhea; moist mucous membranes NECK: Supple, no meningismus, no LAD  CARD: Regular rhythm; S1 and S2 appreciated; no murmurs, no clicks, no rubs, no gallops, tachycardic   RESP: tachypneic, significantly diminished at bases bilaterally, speaking short sentences only, expiratory wheezes and rhonchi, wet cough  ABD/GI: Normal bowel sounds; non-distended; soft, non-tender, no rebound, no guarding, no peritoneal signs BACK:  The back appears normal and is non-tender to palpation, there is no CVA tenderness EXT: Left BKA, Normal ROM in all joints; non-tender to palpation; no edema; normal capillary refill; no cyanosis, no calf tenderness or swelling    SKIN: Normal color for age and race; warm; no rash NEURO: Moves all extremities equally, sensation to light touch intact diffusely, cranial nerves II through XII intact PSYCH: The patient's mood and manner are appropriate. Grooming and personal hygiene are appropriate.  MEDICAL DECISION MAKING: Patient here and chest pain and shortness of breath. He is very diminished diffusely, speaking short sentences, and moderate respiratory distress, wheezing. We'll give continuous albuterol, Atrovent, Solu-Medrol. EKG shows no new ischemic changes. He does not appear significantly volume overloaded at this time. We'll obtain chest x-ray, labs.  ED PROGRESS: Patient has better aeration after  albuterol now sounds more rhonchorous, wet. Will give IV Lasix. His BNP is elevated he does have vascular congestion on chest x-ray. He seems more drowsy to me than previously but is arousable to voice. We'll obtain an ABG and place him on BiPAP. Repeat blood glucose is 425.  Not in DKA.    2:45 AM  Discussed patient's case with hospitalist, Dr. Shanon Brow.  Recommend admission to inpatient, stepdown bed.  I will place holding orders per their request. Patient and family (if present) updated with plan. Care transferred to hospitalist service.  I reviewed all nursing notes, vitals, pertinent old records, EKGs, labs, imaging (as available).    3:15 AM  Patient appears to be improving on BiPAP. He does have a respiratory acidosis.    EKG Interpretation  Date/Time:  Wednesday December 06 2015 01:24:13 EDT Ventricular Rate:  88 PR Interval:  158 QRS Duration: 160 QT Interval:  416 QTC Calculation: 503 R Axis:   40 Text Interpretation:  Sinus or ectopic atrial rhythm Ventricular premature complex IVCD, consider atypical LBBB No significant change since last tracing Confirmed by WARD,  DO, KRISTEN ([id number]) on 12/06/2015 2:09:06 AM         CRITICAL CARE Performed by: Nyra Jabs   Total critical care time: 45 minutes  Critical care time was exclusive of separately billable procedures and treating other patients.  Critical care was necessary to treat or prevent imminent or life-threatening deterioration.  Critical care was time spent personally by me on the following activities: development of treatment plan with patient and/or surrogate as well as nursing, discussions with consultants, evaluation of patient's response to treatment, examination of patient, obtaining history from patient or surrogate, ordering and performing treatments and interventions, ordering and review of laboratory studies, ordering and review of radiographic studies, pulse oximetry and re-evaluation of patient's  condition.    I personally performed the services described in this documentation, which was scribed in my presence. The recorded information has been reviewed and is accurate.    [place], DO 12/06/15 [phone]

## 2015-12-06 NOTE — H&P (Signed)
PCP:   [name], MD   Chief Complaint:  sob  HPI: 75 yo male with h/o copd, chf, AVR on coumadin, right bka, comes in with less than one  Day of worsening sob and wheezing.  Denies fevers.  Has been coughing a lot.  Some swelling in his left ankle.  No n/v/d.  Was noted to be somnulent in the ED so bipap was placed.  He is more awake now.  abg not bad.  Pt referred for admission for copde with possible chf exac also.  Review of Systems:  Positive and negative as per HPI otherwise all other systems are negative  Past Medical History: Past Medical History  Diagnosis Date  . Type 2 diabetes mellitus ([place]) 2007  . Essential hypertension   . Gout   . Hypercholesteremia   . Peripheral vascular disease ([place])     Lower extremity PCI/stenting  . COPD (chronic obstructive pulmonary disease) ([place])   . S/P aortic valve replacement 1990    a. St. Jude  . Chronic back pain   . Dysphagia   . Neuromuscular disorder ([place])   . Peripheral neuropathy ([place])   . Critical lower limb ischemia   . CAD (coronary artery disease)     a. 05/13/14 [name] s/p overlapping DESx2 to SVG to RCA. b.  s/p CABG '90 with redo '94 & stent to RCA SVG in 2005  . Chronic toe ulcer ([place])     a. Left foot  . Chronic systolic heart failure ([place])   . History of kidney stones   . GERD (gastroesophageal reflux disease)   . Arthritis   . Sleep apnea   . Myocardial infarction ([place])   . GI bleeding 05/31/2015    Source not identified.  . Acute blood loss anemia 02/2015 & 05/2015    Hemoglobin 5.5 on 05/31/15; status post transfusion.   Past Surgical History  Procedure Laterality Date  . Aortic valve replacement  1990    St. Jude  . Rotator cuff repair      right  . Cataract extraction      bilateral  . Coronary stent placement  2005    RCA vein graft A 3.0x13.0 TAXUS stent was then placed int he vessel a Viva 3.0x4.0 (perfusion balloon was made ready it was placed through the entire lenght of the stent  .  Peripheral vascular procedures lower extremities      Right external iliac  artery PTA and stenting as well as bilateral SFA intervention remotely. Repeat procedures in 2011 bilaterally  . Coronary artery bypass graft  1994    6 vessels  . [name]  06/13/2011    Procedure: Venia Minks DILATION;  Surgeon: [name], MD;  Location: [facility];  Service: Endoscopy;  Laterality: N/A;  Dilated to 56.   . Angioplasty illiac artery    . Back surgery  [id number]    2  . Eye surgery    . Amputation Left 07/13/2014    Procedure: Transmetatarsal Amputation;  Surgeon: [name], MD;  Location: [place];  Service: Orthopedics;  Laterality: Left;  . Lower extremity angiogram N/A 02/15/2013    Procedure: LOWER EXTREMITY ANGIOGRAM;  Surgeon: [name], MD;  Location: [facility] CATH LAB;  Service: Cardiovascular;  Laterality: N/A;  . Left heart catheterization with coronary angiogram N/A 05/11/2014    Procedure: LEFT HEART CATHETERIZATION WITH CORONARY ANGIOGRAM;  Surgeon: [name], MD;  Location: [facility] C[facility] LAB;  Service: Cardiovascular;  Laterality: N/A;  .  Percutaneous coronary stent intervention (pci-s) N/A 05/13/2014    Procedure: PERCUTANEOUS CORONARY STENT INTERVENTION (PCI-S);  Surgeon: [name], MD;  Location: [facility] CATH LAB;  Service: Cardiovascular;  Laterality: N/A;  . Lower extremity angiogram N/A 06/06/2014    Procedure: LOWER EXTREMITY ANGIOGRAM;  Surgeon: [name], MD;  Location: [facility] CATH LAB;  Service: Cardiovascular;  Laterality: N/A;  . Amputation Left 08/20/2014    Procedure: Revision Transmetatarsal Amputation versus Below Knee Amputation;  Surgeon: [name], MD;  Location: [place];  Service: Orthopedics;  Laterality: Left;  . Stump revision Left 09/23/2014    Procedure: Revision Left Below Knee Amputation;  Surgeon: [name], MD;  Location: [place];  Service: Orthopedics;  Laterality: Left;  . Stump revision Left 10/13/2014    Procedure: REVISION LEFT BELOW  KNEE AMPUTATION STUMP;  Surgeon: [name], MD;  Location: [facility];  Service: Orthopedics;  Laterality: Left;  . Esophagogastroduodenoscopy N/A 02/09/2015    DR. Schooler: Normal EGD  . Esophagogastroduodenoscopy  05/2011    Dr. [name]: s/p esophageal dilation, antral erosions/nodularity with benign biopsies  . Colonoscopy  05/2011    Dr. [name]: benign rectal polyp, left sided tics, ascending colonic ulcers (path c/w ischemia)  . Colonoscopy with propofol N/A 06/01/2015    RMR: normal appearing rectal mucosa. Scattered left-sided diverticula. the remainder of the colonic mucosa appeared normal. the distal 5 cm of terminal ileal mucosa also appeared normal. retroflexion was performed.   Freda Munro capsule study N/A 06/08/2015    Procedure: GIVENS CAPSULE STUDY;  Surgeon: [name], MD;  Location: [facility];  Service: Endoscopy;  Laterality: N/A;  0700   . Esophagogastroduodenoscopy (egd) with propofol N/A 06/20/2015    Procedure: ESOPHAGOGASTRODUODENOSCOPY (EGD) WITH PROPOFOL;  Surgeon: Danie Binder, MD;  Location: [facility];  Service: Endoscopy;  Laterality: N/A;  . Biopsy N/A 06/20/2015    Procedure: BIOPSY;  Surgeon: Danie Binder, MD;  Location: [facility];  Service: Endoscopy;  Laterality: N/A;  . Esophagogastroduodenoscopy N/A 10/04/2015    Procedure: ESOPHAGOGASTRODUODENOSCOPY (EGD);  Surgeon: Doran Stabler, MD;  Location: [facility] ENDOSCOPY;  Service: Endoscopy;  Laterality: N/A;    Medications: Prior to Admission medications   Medication Sig Start Date End Date Taking? Authorizing Provider  albuterol (PROVENTIL) 4 MG tablet Take 4 mg by mouth 3 (three) times daily. 01/20/15   Historical Provider, MD  albuterol-ipratropium (COMBIVENT) 18-103 MCG/ACT inhaler Inhale 1 puff into the lungs 4 (four) times daily. Coughing/ Shortness of Breath    Historical Provider, MD  allopurinol (ZYLOPRIM) 300 MG tablet Take 300 mg by mouth daily.    Historical Provider, MD  CARAFATE 1 GM/10ML  suspension TAKE 10 ML BY MOUTH FOUR TIMES DAILY WITH MEALS AND AT BEDTIME. 08/30/15   Troy Sine, MD  cholecalciferol (VITAMIN D) 1000 UNITS tablet Take 2,000 Units by mouth daily.    Historical Provider, MD  fenofibrate (TRICOR) 145 MG tablet TAKE 1 TABLET BY MOUTH ONCE DAILY FOR CHOLESTEROL. 08/18/15   [name], MD  ferrous sulfate 325 (65 FE) MG EC tablet Take 1 tablet (325 mg total) by mouth daily with breakfast. 06/02/15   Rexene Alberts, MD  fish oil-omega-3 fatty acids 1000 MG capsule Take 1 capsule (1 g total) by mouth 2 (two) times daily. 01/22/13   Casimiro Needle Alvstad, RPH-CPP  furosemide (LASIX) 80 MG tablet Take 0.5 tablets (40 mg total) by mouth daily. 10/14/15   Florencia Reasons, MD  furosemide (  LASIX) 80 MG tablet Take 1 tablet (80 mg total) by mouth once as needed for fluid or edema. 11/21/15   Rolland Porter, MD  glimepiride (AMARYL) 1 MG tablet Take 1 mg by mouth 2 (two) times daily.    Historical Provider, MD  isosorbide mononitrate (IMDUR) 60 MG 24 hr tablet Take 1 tablet (60 mg total) by mouth daily. 07/27/15   Troy Sine, MD  metoprolol succinate (TOPROL-XL) 25 MG 24 hr tablet Take 1 tablet in the morning and 1/2 tablet in the evening 11/07/15   Troy Sine, MD  NITROSTAT 0.4 MG SL tablet PLACE 1 TAB UNDER TONGUE EVERY 5 MIN IF NEEDED FOR CHEST PAIN. MAY USE 3 TIMES.NO RELIEF CALL 911. 07/20/15   Eileen Stanford, PA-C  oxyCODONE-acetaminophen (PERCOCET) 10-325 MG per tablet Take 1 tablet by mouth every 4 (four) hours as needed for pain. Patient taking differently: Take 1 tablet by mouth every 3 (three) hours as needed for pain.  09/23/14   [name], MD  pantoprazole (PROTONIX) 40 MG tablet Take 1 tablet (40 mg total) by mouth 2 (two) times daily before a meal. 06/23/15   Erline Hau, MD  potassium chloride SA (K-DUR,KLOR-CON) 20 MEQ tablet Take 20 mEq by mouth daily.      Historical Provider, MD  pregabalin (LYRICA) 50 MG capsule Take one capsule by mouth twice daily  for pains Patient taking differently: Take 50 mg by mouth 3 (three) times daily.  08/23/14   Blanchie Serve, MD  ramipril (ALTACE) 2.5 MG capsule TAKE ONE CAPSULE BY MOUTH DAILY. 11/15/15   Troy Sine, MD  rosuvastatin (CRESTOR) 20 MG tablet TAKE (1) TABLET BY MOUTH AT BEDTIME FOR CHOLESTEROL. 08/18/15   [name], MD  sitaGLIPtin (JANUVIA) 50 MG tablet Take 50 mg by mouth daily.    Historical Provider, MD  vitamin C (ASCORBIC ACID) 500 MG tablet Take 500 mg by mouth daily.    Historical Provider, MD  warfarin (COUMADIN) 2.5 MG tablet Use as directed 08/10/15   Historical Provider, MD  warfarin (COUMADIN) 5 MG tablet Take 1 tablet (5 mg total) by mouth daily at 6 PM. 10/14/15   Florencia Reasons, MD    Allergies:  No Known Allergies  Social History:  reports that he quit smoking about 19 months ago. His smoking use included Cigarettes. He started smoking about 62 years ago. He has a 55 pack-year smoking history. His smokeless tobacco use includes Chew. He reports that he does not drink alcohol or use illicit drugs.  Family History: Family History  Problem Relation Age of Onset  . Colon cancer Neg Hx   . Liver disease Neg Hx   . Diabetes Father   . Heart failure Other     Physical Exam: Filed Vitals:   12/06/15 0200 12/06/15 0215 12/06/15 0230 12/06/15 0252  BP: 193/64  171/70   Pulse: 93 91 95 93  Temp:      TempSrc:      Resp: '29 18 27 28  '$ Height:      Weight:      SpO2: 98% 100% 100% 99%   General appearance: alert, cooperative and no distress Head: Normocephalic, without obvious abnormality, atraumatic Eyes: negative Nose: Nares normal. Septum midline. Mucosa normal. No drainage or sinus tenderness. Neck: no JVD and supple, symmetrical, trachea midline Lungs: diminished breath sounds bilaterally Heart: regular rate and rhythm, S1, S2 normal, no murmur, click, rub or gallop Abdomen: soft, non-tender; bowel sounds  normal; no masses,  no organomegaly Extremities: extremities  normal, atraumatic, no cyanosis or edema Pulses: 2+ and symmetric Skin: Skin color, texture, turgor normal. No rashes or lesions Neurologic: Grossly normal    Labs on Admission:   Recent Labs  12/06/15 0140  NA 142  K 4.9  CL 109  CO2 23  GLUCOSE 312*  BUN 35*  CREATININE 1.61*  CALCIUM 9.6    Recent Labs  12/06/15 0140  WBC 10.2  NEUTROABS 5.5  HGB 10.5*  HCT 34.5*  MCV 78.8  PLT PLATELET CLUMPS NOTED ON SMEAR, COUNT APPEARS ADEQUATE    Recent Labs  12/06/15 0140  TROPONINI 0.07*   Radiological Exams on Admission: Dg Chest Portable 1 View  12/06/2015  CLINICAL DATA:  Progressive shortness of breath, onset earlier tonight. Chest pain and productive cough. EXAM: PORTABLE CHEST 1 VIEW COMPARISON:  11/21/2015 FINDINGS: Patient is post median sternotomy. Cardiomegaly is unchanged. Unchanged hazy left basilar opacity. Improved right basilar aeration. There is vascular congestion without overt edema. No confluent airspace disease. IMPRESSION: Cardiomegaly and vascular congestion. Improved right basilar aeration from prior. Unchanged pleural parenchymal opacity at the left lung base. Electronically Signed   By: Jeb Levering M.D.   On: 12/06/2015 01:50   Assessment/Plan  75 yo male with acute copde with also likely chf component  Principal Problem:   COPD exacerbation ([place])-  Iv solumedrol.  freq nebs.  Zpack.  cxr no infiltrate, some edema.  On bipap right now, seems to be improving.  Wean thru the night.  pco2 only mildly elevated.  Repeat in couple of hours.  Active Problems:   Systolic CHF, acute on chronic (HCC)- place on iv lasix 40 mg bid for 4 doses, monitor renal fxn while increasing diuresis   Long term current use of anticoagulant therapy- coumadin per pharmacy, inr therapeutic   H/O aortic valve replacement- St Jude- coumadin   CAD - CABG '90 with re do '94 - noted, mild elevation of trop, cont to serial.  Cr bumped up alittle, may be due to renal  function   DM2 (diabetes mellitus, type 2) ([place])- ssi   SOB (shortness of breath)   Chronic mesenteric ischemia (HCC)   Chronic anticoagulation   AVM (arteriovenous malformation)  Admit to stepdown.  Full code.    Even Budlong A 12/06/2015, 3:20 AM

## 2015-12-06 NOTE — Progress Notes (Signed)
Patient admitted to the hospital earlier this morning by Dr. Shanon Brow.  Patient seen and examined. He is feeling significantly improved. Lungs have bibasilar crackles with trace to 1+ pedal edema in RLE. Patient has LLE BKA.  Patient was initially admitted to the hospital with shortness of breath and respiratory failure which was felt to be related to chf exacerbation and copd.  He has been started on nebs, steroids and iv lasix. Overall he is improving and appears to be breathing comfortably on room air. cxr shows vascular congestion. Daughter reports that he had not been taking his medications correctly at home, and had been taking half of the lasix dose that he was prescribed. Will continue with current treatments. Transfer to telemetry bed. Possible discharge in 24 hours.  MEMON,JEHANZEB

## 2015-12-06 NOTE — Progress Notes (Signed)
Pt arrived to unit rm 334 in stable condition.  Report received from Rubie Maid, RN.

## 2015-12-06 NOTE — Progress Notes (Signed)
Pt reporting indigestion and chest pain.  Notified Dr. Roderic Palau.

## 2015-12-06 NOTE — Progress Notes (Addendum)
Inpatient Diabetes Program Recommendations  AACE/ADA: New Consensus Statement on Inpatient Glycemic Control (2015)  Target Ranges:  Prepandial:   less than 140 mg/dL      Peak postprandial:   less than 180 mg/dL (1-2 hours)      Critically ill patients:  140 - 180 mg/dL   Results for DARRELL, HAUK (MRN [id number]) as of 12/06/2015 13:35  Ref. Range 12/06/2015 04:31 12/06/2015 07:26 12/06/2015 12:50  Glucose-Capillary Latest Ref Range: 65-99 mg/dL 425 (H) 364 (H) 399 (H)    Admit with: SOB  History: DM, COPD, CHF  Home DM Meds: Amaryl 1 mg bid      Januvia 50 mg daily  Current Insulin Orders: Novolog Sensitive Correction Scale/ SSI (0-9 units) Q4 hours       MD- Note patient currently getting IV Solumedrol 80 mg bid.  Glucose levels extremely elevated likely due to steroids.  Please consider the following in-hospital insulin adjustments:  1. Start Levemir 14 units daily (0.15 units/kg dosing)  2. Increase Novolog Correction Scale/ SSI to Moderate scale (0-15 units) TID AC + HS      --Will follow patient during hospitalization--  Wyn Quaker RN, MSN, CDE Diabetes Coordinator Inpatient Glycemic Control Team Team Pager: [phone] (8a-5p)

## 2015-12-06 NOTE — Progress Notes (Signed)
Pt to be transferred to Dept 300 on telemetry.  Report called to [employer].

## 2015-12-06 NOTE — Progress Notes (Signed)
Bedtime CBG 374, MD notified.

## 2015-12-06 NOTE — ED Notes (Addendum)
Pt c/o sob and chest pain that is worse on palpitation. Per ems pt blood sugar was 376.

## 2015-12-06 NOTE — Progress Notes (Signed)
ANTICOAGULATION CONSULT NOTE - Initial Consult  Pharmacy Consult for Coumadin Indication: Mechanical Valve  No Known Allergies  Patient Measurements: Height: '5\' 10"'$  (177.8 cm) Weight: 210 lb 8.6 oz (95.5 kg) IBW/kg (Calculated) : 73  Vital Signs: Temp: 96.7 F (35.9 C) (04/19 0422) Temp Source: Axillary (04/19 0422) BP: 126/45 mmHg (04/19 0600) Pulse Rate: 33 (04/19 0600)  Labs:  Recent Labs  12/06/15 0140 12/06/15 0254 12/06/15 0412  HGB 10.5*  --  9.9*  HCT 34.5*  --  32.8*  PLT PLATELET CLUMPS NOTED ON SMEAR, COUNT APPEARS ADEQUATE  --  289  LABPROT  --  25.4*  --   INR  --  2.34*  --   CREATININE 1.61*  --  1.57*  TROPONINI 0.07*  --   --     Estimated Creatinine Clearance: 47.2 mL/min (by C-G formula based on Cr of 1.57).   Medical History: Past Medical History  Diagnosis Date  . Type 2 diabetes mellitus ([place]) 2007  . Essential hypertension   . Gout   . Hypercholesteremia   . Peripheral vascular disease ([place])     Lower extremity PCI/stenting  . COPD (chronic obstructive pulmonary disease) ([place])   . S/P aortic valve replacement 1990    a. St. Jude  . Chronic back pain   . Dysphagia   . Neuromuscular disorder ([place])   . Peripheral neuropathy ([place])   . Critical lower limb ischemia   . CAD (coronary artery disease)     a. 05/13/14 [name] s/p overlapping DESx2 to SVG to RCA. b.  s/p CABG '90 with redo '94 & stent to RCA SVG in 2005  . Chronic toe ulcer ([place])     a. Left foot  . Chronic systolic heart failure ([place])   . History of kidney stones   . GERD (gastroesophageal reflux disease)   . Arthritis   . Sleep apnea   . Myocardial infarction ([place])   . GI bleeding 05/31/2015    Source not identified.  . Acute blood loss anemia 02/2015 & 05/2015    Hemoglobin 5.5 on 05/31/15; status post transfusion.    Medications:  Prescriptions prior to admission  Medication Sig Dispense Refill Last Dose  . albuterol (PROVENTIL) 4 MG tablet Take 4 mg by mouth 3  (three) times daily.   Unknown at Unknown time  . albuterol-ipratropium (COMBIVENT) 18-103 MCG/ACT inhaler Inhale 1 puff into the lungs 4 (four) times daily. Coughing/ Shortness of Breath   Unknown at Unknown time  . allopurinol (ZYLOPRIM) 300 MG tablet Take 300 mg by mouth daily.   Unknown at Unknown time  . CARAFATE 1 GM/10ML suspension TAKE 10 ML BY MOUTH FOUR TIMES DAILY WITH MEALS AND AT BEDTIME. 420 mL 0 Unknown at Unknown time  . cholecalciferol (VITAMIN D) 1000 UNITS tablet Take 2,000 Units by mouth daily.   Unknown at Unknown time  . fenofibrate (TRICOR) 145 MG tablet TAKE 1 TABLET BY MOUTH ONCE DAILY FOR CHOLESTEROL. 30 tablet 3 Unknown at Unknown time  . ferrous sulfate 325 (65 FE) MG EC tablet Take 1 tablet (325 mg total) by mouth daily with breakfast.   Unknown at Unknown time  . fish oil-omega-3 fatty acids 1000 MG capsule Take 1 capsule (1 g total) by mouth 2 (two) times daily.   Unknown at Unknown time  . furosemide (LASIX) 80 MG tablet Take 0.5 tablets (40 mg total) by mouth daily. 30 tablet 0 Unknown at Unknown time  . furosemide (LASIX) 80 MG tablet  Take 1 tablet (80 mg total) by mouth once as needed for fluid or edema. 6 tablet 0 Unknown at Unknown time  . glimepiride (AMARYL) 1 MG tablet Take 1 mg by mouth 2 (two) times daily.   Unknown at Unknown time  . isosorbide mononitrate (IMDUR) 60 MG 24 hr tablet Take 1 tablet (60 mg total) by mouth daily. 90 tablet 3 Unknown at Unknown time  . metoprolol succinate (TOPROL-XL) 25 MG 24 hr tablet Take 1 tablet in the morning and 1/2 tablet in the evening 45 tablet 6 Unknown at Unknown time  . NITROSTAT 0.4 MG SL tablet PLACE 1 TAB UNDER TONGUE EVERY 5 MIN IF NEEDED FOR CHEST PAIN. MAY USE 3 TIMES.NO RELIEF CALL 911. 25 tablet 4 Unknown at Unknown time  . oxyCODONE-acetaminophen (PERCOCET) 10-325 MG per tablet Take 1 tablet by mouth every 4 (four) hours as needed for pain. (Patient taking differently: Take 1 tablet by mouth every 3 (three)  hours as needed for pain. ) 30 tablet 0 Unknown at Unknown time  . pantoprazole (PROTONIX) 40 MG tablet Take 1 tablet (40 mg total) by mouth 2 (two) times daily before a meal. 60 tablet 2 Unknown at Unknown time  . potassium chloride SA (K-DUR,KLOR-CON) 20 MEQ tablet Take 20 mEq by mouth daily.     Unknown at Unknown time  . pregabalin (LYRICA) 50 MG capsule Take one capsule by mouth twice daily for pains (Patient taking differently: Take 50 mg by mouth 3 (three) times daily. ) 60 capsule 5 Unknown at Unknown time  . ramipril (ALTACE) 2.5 MG capsule TAKE ONE CAPSULE BY MOUTH DAILY. 30 capsule 6 Unknown at Unknown time  . rosuvastatin (CRESTOR) 20 MG tablet TAKE (1) TABLET BY MOUTH AT BEDTIME FOR CHOLESTEROL. 30 tablet 3 Unknown at Unknown time  . sitaGLIPtin (JANUVIA) 50 MG tablet Take 50 mg by mouth daily.   Unknown at Unknown time  . vitamin C (ASCORBIC ACID) 500 MG tablet Take 500 mg by mouth daily.   Unknown at Unknown time  . warfarin (COUMADIN) 2.5 MG tablet Use as directed   Unknown at Unknown time  . warfarin (COUMADIN) 5 MG tablet Take 1 tablet (5 mg total) by mouth daily at 6 PM. 30 tablet 0 Unknown at Unknown time    Assessment: 75 yo male on chronic anticoagulation with warfarin PTA for St. Jude AVR. Has h/o GI bleeds and target goal is 2.5 lower end of range. INR is 2.34 this AM, will continue home regimen of '5mg'$  daily.  Goal of Therapy:  INR 2.5-3.5 per cardiology and try to keep closer to 2.5. Monitor platelets by anticoagulation protocol: Yes   Plan:  Coumadin '5mg'$  today Daily PT/INR Monitor for S/S of bleeding  Isac Sarna, BS Vena [place], BCPS Clinical Pharmacist Pager [phone] 12/06/2015,8:00 AM

## 2015-12-07 DIAGNOSIS — E1122 Type 2 diabetes mellitus with diabetic chronic kidney disease: Secondary | ICD-10-CM

## 2015-12-07 LAB — CBC
HEMATOCRIT: 29.5 % — AB (ref 39.0–52.0)
HEMOGLOBIN: 9.4 g/dL — AB (ref 13.0–17.0)
MCH: 24.5 pg — ABNORMAL LOW (ref 26.0–34.0)
MCHC: 31.9 g/dL (ref 30.0–36.0)
MCV: 76.8 fL — ABNORMAL LOW (ref 78.0–100.0)
Platelets: 260 10*3/uL (ref 150–400)
RBC: 3.84 MIL/uL — ABNORMAL LOW (ref 4.22–5.81)
RDW: 19 % — ABNORMAL HIGH (ref 11.5–15.5)
WBC: 13.1 10*3/uL — AB (ref 4.0–10.5)

## 2015-12-07 LAB — BASIC METABOLIC PANEL
ANION GAP: 11 (ref 5–15)
BUN: 38 mg/dL — ABNORMAL HIGH (ref 6–20)
CALCIUM: 9.7 mg/dL (ref 8.9–10.3)
CHLORIDE: 103 mmol/L (ref 101–111)
CO2: 26 mmol/L (ref 22–32)
CREATININE: 1.34 mg/dL — AB (ref 0.61–1.24)
GFR calc non Af Amer: 50 mL/min — ABNORMAL LOW (ref >60)
GFR, EST AFRICAN AMERICAN: 58 mL/min — AB (ref >60)
Glucose, Bld: 327 mg/dL — ABNORMAL HIGH (ref 65–99)
Potassium: 4 mmol/L (ref 3.5–5.1)
SODIUM: 140 mmol/L (ref 135–145)

## 2015-12-07 LAB — GLUCOSE, CAPILLARY
GLUCOSE-CAPILLARY: 314 mg/dL — AB (ref 65–99)
GLUCOSE-CAPILLARY: 405 mg/dL — AB (ref 65–99)
GLUCOSE-CAPILLARY: 264 mg/dL — AB (ref 65–99)

## 2015-12-07 LAB — PROTIME-INR
INR: 2.92 — AB (ref 0.00–1.49)
PROTHROMBIN TIME: 30 s — AB (ref 11.6–15.2)

## 2015-12-07 MED ORDER — INSULIN DETEMIR 100 UNIT/ML FLEXPEN: 20.0000 [IU] | PEN_INJECTOR | Freq: Every day | SUBCUTANEOUS | Status: DC

## 2015-12-07 MED ORDER — AZITHROMYCIN 250 MG PO TABS: ORAL_TABLET | ORAL | Status: DC

## 2015-12-07 MED ORDER — INSULIN PEN NEEDLE 29G X 12.7MM MISC: Status: AC

## 2015-12-07 MED ORDER — INSULIN DETEMIR 100 UNIT/ML ~~LOC~~ SOLN
20.0000 [IU] | Freq: Every day | SUBCUTANEOUS | Status: DC
  Administered 2015-12-07: 20 [IU] via SUBCUTANEOUS
  Filled 2015-12-07 (×2): qty 0.2

## 2015-12-07 MED ORDER — CLOPIDOGREL BISULFATE 75 MG PO TABS
75.0000 mg | ORAL_TABLET | Freq: Every day | ORAL | Status: DC
  Administered 2015-12-07: 75 mg via ORAL
  Filled 2015-12-07: qty 1

## 2015-12-07 MED ORDER — INSULIN ASPART 100 UNIT/ML ~~LOC~~ SOLN
0.0000 [IU] | Freq: Three times a day (TID) | SUBCUTANEOUS | Status: DC
  Administered 2015-12-07: 5 [IU] via SUBCUTANEOUS

## 2015-12-07 MED ORDER — INSULIN ASPART 100 UNIT/ML ~~LOC~~ SOLN: 0.0000 [IU] | Freq: Every day | SUBCUTANEOUS | Status: DC

## 2015-12-07 MED ORDER — FUROSEMIDE 80 MG PO TABS: 80.0000 mg | ORAL_TABLET | Freq: Every day | ORAL | Status: DC

## 2015-12-07 MED ORDER — PREDNISONE 20 MG PO TABS: 40.0000 mg | ORAL_TABLET | Freq: Every day | ORAL | Status: DC

## 2015-12-07 MED ORDER — PREDNISONE 10 MG PO TABS: ORAL_TABLET | ORAL | Status: DC

## 2015-12-07 NOTE — Progress Notes (Addendum)
ANTICOAGULATION CONSULT NOTE -  Pharmacy Consult for Coumadin Indication: Mechanical Valve  No Known Allergies  Patient Measurements: Height: '5\' 10"'$  (177.8 cm) Weight: 210 lb 8.6 oz (95.5 kg) IBW/kg (Calculated) : 73  Vital Signs: Temp: 97.9 F (36.6 C) (04/20 0637) Temp Source: Oral (04/20 0637) BP: 154/53 mmHg (04/20 0637) Pulse Rate: 38 (04/20 0637)  Labs:  Recent Labs  12/06/15 0140 12/06/15 0254 12/06/15 0412 12/06/15 1020 12/06/15 1514 12/07/15 0658  HGB 10.5*  --  9.9*  --   --  9.4*  HCT 34.5*  --  32.8*  --   --  29.5*  PLT PLATELET CLUMPS NOTED ON SMEAR, COUNT APPEARS ADEQUATE  --  289  --   --  260  LABPROT  --  25.4*  --   --   --  30.0*  INR  --  2.34*  --   --   --  2.92*  CREATININE 1.61*  --  1.57*  --   --  1.34*  TROPONINI 0.07*  --   --  0.09* 0.10*  --     Estimated Creatinine Clearance: 55.2 mL/min (by C-G formula based on Cr of 1.34).   Medical History: Past Medical History  Diagnosis Date  . Type 2 diabetes mellitus ([place]) 2007  . Essential hypertension   . Gout   . Hypercholesteremia   . Peripheral vascular disease ([place])     Lower extremity PCI/stenting  . COPD (chronic obstructive pulmonary disease) ([place])   . S/P aortic valve replacement 1990    a. St. Jude  . Chronic back pain   . Dysphagia   . Neuromuscular disorder ([place])   . Peripheral neuropathy ([place])   . Critical lower limb ischemia   . CAD (coronary artery disease)     a. 05/13/14 [name] s/p overlapping DESx2 to SVG to RCA. b.  s/p CABG '90 with redo '94 & stent to RCA SVG in 2005  . Chronic toe ulcer ([place])     a. Left foot  . Chronic systolic heart failure ([place])   . History of kidney stones   . GERD (gastroesophageal reflux disease)   . Arthritis   . Sleep apnea   . Myocardial infarction ([place])   . GI bleeding 05/31/2015    Source not identified.  . Acute blood loss anemia 02/2015 & 05/2015    Hemoglobin 5.5 on 05/31/15; status post transfusion.    Medications:   Prescriptions prior to admission  Medication Sig Dispense Refill Last Dose  . albuterol (PROVENTIL) 4 MG tablet Take 4 mg by mouth 3 (three) times daily.   12/05/2015 at Unknown time  . albuterol-ipratropium (COMBIVENT) 18-103 MCG/ACT inhaler Inhale 1 puff into the lungs 4 (four) times daily. Coughing/ Shortness of Breath   12/05/2015 at Unknown time  . allopurinol (ZYLOPRIM) 300 MG tablet Take 300 mg by mouth daily.   12/05/2015 at Unknown time  . cholecalciferol (VITAMIN D) 1000 UNITS tablet Take 2,000 Units by mouth daily.   Past Week at Unknown time  . clopidogrel (PLAVIX) 75 MG tablet Take 1 tablet by mouth daily.   12/05/2015 at Unknown time  . fenofibrate (TRICOR) 145 MG tablet TAKE 1 TABLET BY MOUTH ONCE DAILY FOR CHOLESTEROL. 30 tablet 3 12/05/2015 at Unknown time  . ferrous sulfate 325 (65 FE) MG EC tablet Take 1 tablet (325 mg total) by mouth daily with breakfast.   12/05/2015 at Unknown time  . fish oil-omega-3 fatty acids 1000 MG capsule Take 1 capsule (  1 g total) by mouth 2 (two) times daily.   12/05/2015 at Unknown time  . furosemide (LASIX) 80 MG tablet Take 0.5 tablets (40 mg total) by mouth daily. (Patient taking differently: Take 80 mg by mouth daily. ) 30 tablet 0 12/05/2015 at Unknown time  . glimepiride (AMARYL) 1 MG tablet Take 1 mg by mouth 2 (two) times daily.   12/05/2015 at Unknown time  . isosorbide mononitrate (IMDUR) 60 MG 24 hr tablet Take 1 tablet (60 mg total) by mouth daily. 90 tablet 3 12/05/2015 at Unknown time  . metoprolol succinate (TOPROL-XL) 25 MG 24 hr tablet Take 1 tablet in the morning and 1/2 tablet in the evening 45 tablet 6 12/05/2015 at Unknown time  . NITROSTAT 0.4 MG SL tablet PLACE 1 TAB UNDER TONGUE EVERY 5 MIN IF NEEDED FOR CHEST PAIN. MAY USE 3 TIMES.NO RELIEF CALL 911. 25 tablet 4 unknown  . oxyCODONE-acetaminophen (PERCOCET) 10-325 MG per tablet Take 1 tablet by mouth every 4 (four) hours as needed for pain. (Patient taking differently: Take 1 tablet by  mouth every 3 (three) hours as needed for pain. ) 30 tablet 0 12/05/2015 at Unknown time  . pantoprazole (PROTONIX) 40 MG tablet Take 1 tablet (40 mg total) by mouth 2 (two) times daily before a meal. 60 tablet 2 12/05/2015 at Unknown time  . potassium chloride SA (K-DUR,KLOR-CON) 20 MEQ tablet Take 20 mEq by mouth daily.     12/05/2015 at Unknown time  . pregabalin (LYRICA) 50 MG capsule Take one capsule by mouth twice daily for pains (Patient taking differently: Take 50 mg by mouth 2 (two) times daily. ) 60 capsule 5 12/05/2015 at Unknown time  . ramipril (ALTACE) 2.5 MG capsule TAKE ONE CAPSULE BY MOUTH DAILY. 30 capsule 6 12/05/2015 at Unknown time  . rosuvastatin (CRESTOR) 20 MG tablet TAKE (1) TABLET BY MOUTH AT BEDTIME FOR CHOLESTEROL. 30 tablet 3 12/05/2015 at Unknown time  . sitaGLIPtin (JANUVIA) 50 MG tablet Take 50 mg by mouth daily.   12/05/2015 at Unknown time  . vitamin C (ASCORBIC ACID) 500 MG tablet Take 500 mg by mouth daily.   12/05/2015 at Unknown time  . furosemide (LASIX) 80 MG tablet Take 1 tablet (80 mg total) by mouth once as needed for fluid or edema. 6 tablet 0 Unknown at Unknown time  . warfarin (COUMADIN) 5 MG tablet Take 1 tablet (5 mg total) by mouth daily at 6 PM. (Patient taking differently: Take 5 mg by mouth daily at 6 PM. Take 5 mg on Monday and Friday and 2.5 mg all other days.) 30 tablet 0 Unknown at Unknown time    Assessment: 75 yo male on chronic anticoagulation with warfarin PTA for St. Jude AVR. Has h/o GI bleeds and target goal is 2.5 lower end of range. INR is 2.92 this AM, will continue home regimen of '5mg'$  daily. HGB stable  Goal of Therapy:  INR 2.5-3.5 per cardiology and try to keep closer to 2.5. Monitor platelets by anticoagulation protocol: Yes   Plan:  Coumadin '5mg'$  today Daily PT/INR Monitor for S/S of bleeding  Isac Sarna, BS [place], BCPS Clinical Pharmacist Pager [phone] 12/07/2015,8:40 AM

## 2015-12-07 NOTE — Progress Notes (Signed)
Patient alert and oriented, independent, VSS, pt. Tolerating diet well. No complaints of pain or nausea. Pt. Had IV removed tip intact. Pt. Had prescriptions given. Pt. Voiced understanding of discharge instructions with no further questions. Pt. Discharged via wheelchair with auxilliary.

## 2015-12-07 NOTE — Discharge Summary (Signed)
Physician Discharge Summary  Tanner Pena DGL:[id number] DOB: [birth year] DOA: 12/06/2015  PCP: Tanner Pena., MD  Admit date: 12/06/2015 Discharge date: 12/07/2015  Time spent: 35 minutes  Recommendations for Outpatient Follow-up:  1. Follow up with PCP in 1-2  2. He will need to follow up with his endocrinologist, Dr. [name] within a week. Patient has been started on levemir to help control blood sugars. He has been advised to track blood sugars in a log to present to endocrinologist on follow up. 3. Ramipril discontinued due to renal dysfunction, restart as outpatient as appropriate   Discharge Diagnoses:  Principal Problem:   COPD exacerbation ([place]) Active Problems:   Long term current use of anticoagulant therapy   H/O aortic valve replacement- St Jude   CAD - CABG '90 with re do '94    DM2 (diabetes mellitus, type 2) (HCC)   SOB (shortness of breath)   Systolic CHF, acute on chronic (HCC)   Chronic mesenteric ischemia (HCC)   Chronic anticoagulation   AVM (arteriovenous malformation)   Discharge Condition: improved  Diet recommendation: carb modified  Filed Weights   12/06/15 0117 12/06/15 0422  Weight: 99.791 kg (220 lb) 95.5 kg (210 lb 8.6 oz)    History of present illness:  75 yom with a hx of COPD, CHF, AVR on coumadin, and right BKA presented with complaints of less than one day of SOB and wheezing with associated cough. He also reports left ankle edema. While in the ED, he was noted to be somnolent so a bipap was placed, and he has since improved. He was referred for admission with COPD exacerbation and possible CHF exacerbation.   Hospital Course:  Patient was found to be in acute respiratory failure due to COPD exacerbation for which he was placed on BiPAP and admitted to stepdown. CXR showed no signs of infiltrate. He was also started on IV steroids, nebs, and Zpack. He began to show signs of significant improvement while on BiPAP and was continuously  weaned throughout the night. He was then able to breath comfortably on room air and was transferred to telemetry. Steroids were changed to prednisone taper and he was continued on nebs. He was ready for discharge on 4/20.   1. Acute on chronic systolic CHF. Patient was initially treated with IV lasix with improvement in his volume status. He reports that he may have been taking his Lasix at home and correctly and may have been taking half the dose that was prescribed. He was advised to continue on a full dose of 80 mg of Lasix. His lung status appears to be improved and he is euvolemic.  2. COPD exacerbation. Improved. See management as above 3. DM type 2. Patient reports that his blood sugars have been running high at home. During his hospital stay, blood sugars are ranging in the 300 to 400s. This was likely exacerbated while in the hospital due to intravenous steroids. Since he reports dietary indiscretions and heart blood sugars at home, he was started on Levemir with improvement of his blood sugars. He reports that he will follow up with his endocrinologist in the next week for further adjustment of his medication regimen. Anticipate his blood sugar should improve as his steroids are tapered. He's been advised to check his blood sugars regularly at home. 4. Essential HTN. Continue antihypertensives. Ramipril has been discontinued due to renal dysfunction. Resume as an outpatient as appropriate. 5. HLD. Continue statins 6. Tobacco abuse. Counseled on cessation. 7. CAD, CABG '  75 with re-do '94. 8. H/o aortic valve replacement. Patient is on anticoagulation.  Procedures:  none  Consultations:  none  Discharge Exam: Filed Vitals:   12/07/15 0637 12/07/15 1519  BP: 154/53 165/94  Pulse: 38 69  Temp: 97.9 F (36.6 C) 98.1 F (36.7 C)  Resp: 17 18   Examination:   General exam: Appears calm and comfortable  Respiratory system: Clear to auscultation. Respiratory effort  normal. Cardiovascular system: S1 & S2 heard, RRR. No JVD, murmurs, rubs, gallops or clicks. No pedal edema. Gastrointestinal system: Abdomen is nondistended, soft and nontender. No organomegaly or masses felt. Normal bowel sounds heard. Central nervous system: Alert and oriented. No focal neurological deficits. Extremities: Symmetric 5 x 5 power. Skin: No rashes, lesions or ulcers Psychiatry: Judgement and insight appear normal. Mood & affect appropriate.   Discharge Instructions   Discharge Instructions    AMB Referral to [place] Management    Complete by:  As directed   Please assign RNCM for Restart of [facility] program services and transition of care program. Patient agrees to restart [facility] program services. Consent on file.  Please call with any questions or concerns  Royetta Crochet. Niemczura, RN, BSN, [place] [phone]) Business Cell  [phone]) Toll Free Office  Reason for consult:  [facility] active  Diagnoses of:   Heart Failure COPD/ Pneumonia Diabetes    Expected date of contact:  1-3 days (reserved for hospital discharges)     Diet - low sodium heart healthy    Complete by:  As directed      Increase activity slowly    Complete by:  As directed           Current Discharge Medication List    START taking these medications   Details  azithromycin (ZITHROMAX) 250 MG tablet Take 1 tab po daily Qty: 3 each, Refills: 0    Insulin Detemir (LEVEMIR) 100 UNIT/ML Pen Inject 20 Units into the skin daily. Qty: 15 mL, Refills: 11    Insulin Pen Needle 29G X 12.7MM MISC Use as directed Qty: 30 each, Refills: 0    predniSONE (DELTASONE) 10 MG tablet Take '40mg'$  po daily for 2 days then '30mg'$  daily for 2 days then '20mg'$  daily for 2 days then '10mg'$  daily for 2 days then stop Qty: 20 tablet, Refills: 0      CONTINUE these medications which have CHANGED   Details  furosemide (LASIX) 80 MG tablet Take 1 tablet (80 mg total) by mouth daily. Qty:  30 tablet, Refills: 0      CONTINUE these medications which have NOT CHANGED   Details  albuterol (PROVENTIL) 4 MG tablet Take 4 mg by mouth 3 (three) times daily.    albuterol-ipratropium (COMBIVENT) 18-103 MCG/ACT inhaler Inhale 1 puff into the lungs 4 (four) times daily. Coughing/ Shortness of Breath    allopurinol (ZYLOPRIM) 300 MG tablet Take 300 mg by mouth daily.    cholecalciferol (VITAMIN D) 1000 UNITS tablet Take 2,000 Units by mouth daily.    clopidogrel (PLAVIX) 75 MG tablet Take 1 tablet by mouth daily.    fenofibrate (TRICOR) 145 MG tablet TAKE 1 TABLET BY MOUTH ONCE DAILY FOR CHOLESTEROL. Qty: 30 tablet, Refills: 3    ferrous sulfate 325 (65 FE) MG EC tablet Take 1 tablet (325 mg total) by mouth daily with breakfast.    fish oil-omega-3 fatty acids 1000 MG capsule Take 1 capsule (1 g total) by mouth 2 (two)  times daily.    glimepiride (AMARYL) 1 MG tablet Take 1 mg by mouth 2 (two) times daily.    isosorbide mononitrate (IMDUR) 60 MG 24 hr tablet Take 1 tablet (60 mg total) by mouth daily. Qty: 90 tablet, Refills: 3    metoprolol succinate (TOPROL-XL) 25 MG 24 hr tablet Take 1 tablet in the morning and 1/2 tablet in the evening Qty: 45 tablet, Refills: 6    NITROSTAT 0.4 MG SL tablet PLACE 1 TAB UNDER TONGUE EVERY 5 MIN IF NEEDED FOR CHEST PAIN. MAY USE 3 TIMES.NO RELIEF CALL 911. Qty: 25 tablet, Refills: 4    oxyCODONE-acetaminophen (PERCOCET) 10-325 MG per tablet Take 1 tablet by mouth every 4 (four) hours as needed for pain. Qty: 30 tablet, Refills: 0    pantoprazole (PROTONIX) 40 MG tablet Take 1 tablet (40 mg total) by mouth 2 (two) times daily before a meal. Qty: 60 tablet, Refills: 2    potassium chloride SA (K-DUR,KLOR-CON) 20 MEQ tablet Take 20 mEq by mouth daily.      pregabalin (LYRICA) 50 MG capsule Take one capsule by mouth twice daily for pains Qty: 60 capsule, Refills: 5    rosuvastatin (CRESTOR) 20 MG tablet TAKE (1) TABLET BY MOUTH AT  BEDTIME FOR CHOLESTEROL. Qty: 30 tablet, Refills: 3    sitaGLIPtin (JANUVIA) 50 MG tablet Take 50 mg by mouth daily.    vitamin C (ASCORBIC ACID) 500 MG tablet Take 500 mg by mouth daily.    warfarin (COUMADIN) 5 MG tablet Take 1 tablet (5 mg total) by mouth daily at 6 PM. Qty: 30 tablet, Refills: 0      STOP taking these medications     ramipril (ALTACE) 2.5 MG capsule        No Known Allergies    The results of significant diagnostics from this hospitalization (including imaging, microbiology, ancillary and laboratory) are listed below for reference.    Significant Diagnostic Studies: [place] Art/ven Flow Abd Pelv Doppler  11/29/2015  CLINICAL DATA:  75 year old gentleman with a history of chronic mesenteric ischemia, previously treated with angioplasty and stenting of the superior mesenteric artery 03/06/2015. Prior duplex study was limited 06/06/2015 secondary to bowel gas. Cardiovascular risk factors include hypertension, known coronary artery disease, known vascular disease, tobacco use, diabetes. EXAM: MESENTERIC ARTERIAL DUPLEX TECHNIQUE: Grayscale and color imaging with duplex evaluation of the abdominal mesenteric vasculature. COMPARISON:  None. FINDINGS: Bowel gas on the current study limits evaluation of the celiac artery, splenic artery, hepatic artery. Aorta: Proximal diameter 2.4 cm Mid diameter 2.3 cm Distal diameter 1.9 cm Aortic velocity 90 centimeter/second SMA: Proximal velocity 180 centimeter/second Mid velocity 190 centimeter/second Distal velocity measured 473 centimeter/second. IMPRESSION: Limited mesenteric arterial duplex demonstrates patency of the superior mesenteric artery, with no evidence of significant stenosis proximally or in the mid segment. Elevated velocity distally may indicate a distal stenosis, although the sonographic window somewhat limited secondary to extremely calcified vasculature and the bowel gas. Signed, Dulcy Fanny. Earleen Newport, DO [facility] Electronically Signed   By: Corrie Mckusick D.O.   On: 11/29/2015 15:11   Dg Chest Portable 1 View  12/06/2015  CLINICAL DATA:  Progressive shortness of breath, onset earlier tonight. Chest pain and productive cough. EXAM: PORTABLE CHEST 1 VIEW COMPARISON:  11/21/2015 FINDINGS: Patient is post median sternotomy. Cardiomegaly is unchanged. Unchanged hazy left basilar opacity. Improved right basilar aeration. There is vascular congestion without overt edema. No confluent airspace disease. IMPRESSION: Cardiomegaly and vascular  congestion. Improved right basilar aeration from prior. Unchanged pleural parenchymal opacity at the left lung base. Electronically Signed   By: Jeb Levering M.D.   On: 12/06/2015 01:50   Dg Chest Port 1 View  11/21/2015  CLINICAL DATA:  Shortness of breath this morning. EXAM: PORTABLE CHEST 1 VIEW COMPARISON:  Most recent chest radiograph 08/07/2015. FINDINGS: Patient is post median sternotomy. Cardiomegaly is unchanged. There is unchanged elevation of left hemidiaphragm and left basilar opacity. Increased ill-defined and hazy opacity at the right lung base. Mild vascular congestion. No pneumothorax. IMPRESSION: Hazy opacity at the right lung base, may reflect combination of pleural effusion and atelectasis. Mild vascular congestion. Recommend correlation for CHF. Cardiomegaly is stable. Electronically Signed   By: Jeb Levering M.D.   On: 11/21/2015 04:49    Microbiology: Recent Results (from the past 240 hour(s))  MRSA PCR Screening     Status: None   Collection Time: 12/06/15  4:11 AM  Result Value Ref Range Status   MRSA by PCR NEGATIVE NEGATIVE Final    Comment:        The GeneXpert MRSA Assay (FDA approved for NASAL specimens only), is one component of a comprehensive MRSA colonization surveillance program. It is not intended to diagnose MRSA infection nor to guide or monitor treatment for MRSA  infections.      Labs: Basic Metabolic Panel:  Recent Labs Lab 12/06/15 0140 12/06/15 0412 12/07/15 0658  NA 142 142 140  K 4.9 4.9 4.0  CL 109 109 103  CO2 '23 25 26  '$ GLUCOSE 312* 340* 327*  BUN 35* 35* 38*  CREATININE 1.61* 1.57* 1.34*  CALCIUM 9.6 9.5 9.7   Liver Function Tests: No results for input(s): AST, ALT, ALKPHOS, BILITOT, PROT, ALBUMIN in the last 168 hours. No results for input(s): LIPASE, AMYLASE in the last 168 hours. No results for input(s): AMMONIA in the last 168 hours. CBC:  Recent Labs Lab 12/06/15 0140 12/06/15 0412 12/07/15 0658  WBC 10.2 9.5 13.1*  NEUTROABS 5.5  --   --   HGB 10.5* 9.9* 9.4*  HCT 34.5* 32.8* 29.5*  MCV 78.8 79.6 76.8*  PLT PLATELET CLUMPS NOTED ON SMEAR, COUNT APPEARS ADEQUATE 289 260   Cardiac Enzymes:  Recent Labs Lab 12/06/15 0140 12/06/15 1020 12/06/15 1514  TROPONINI 0.07* 0.09* 0.10*   BNP: BNP (last 3 results)  Recent Labs  07/28/15 1944 11/21/15 0340 12/06/15 0140  BNP 156.0* 737.0* 648.0*    ProBNP (last 3 results) No results for input(s): PROBNP in the last 8760 hours.  CBG:  Recent Labs Lab 12/06/15 1659 12/06/15 2129 12/07/15 0745 12/07/15 1144 12/07/15 1632  GLUCAP 374* 330* 314* 405* 264*   Signed:  Kathie Dike, MD.  [facility] 12/07/2015, 6:04 PM

## 2015-12-07 NOTE — Consult Note (Signed)
   [facility] CM Inpatient Consult   12/07/2015  Tanner Pena 05/18/1941 [id number]   Spoke with patient at bedside regarding the restart of services with [place] Management. Patient verbalized  interest in restarting[facility] Care Management services. Patient signed current consent form is in chart. Patient will receive post hospital follow up calls and be assessed for home visits.  Of note, [facility] services does not replace or interfere with any services that are arranged by inpatient case management or social work.  For questions, please contact:  Royetta Crochet. Laymond Purser, RN, BSN, [place] [phone]) Business Cell  [phone]) Toll Free Office

## 2015-12-08 ENCOUNTER — Ambulatory Visit (INDEPENDENT_AMBULATORY_CARE_PROVIDER_SITE_OTHER): Payer: Medicare Other | Admitting: Pharmacist Clinician (PhC)/ Clinical Pharmacy Specialist

## 2015-12-08 ENCOUNTER — Other Ambulatory Visit: Payer: Self-pay | Admitting: *Deleted

## 2015-12-08 ENCOUNTER — Other Ambulatory Visit: Payer: Self-pay | Admitting: Cardiovascular Disease

## 2015-12-08 DIAGNOSIS — Z954 Presence of other heart-valve replacement: Secondary | ICD-10-CM

## 2015-12-08 DIAGNOSIS — Z952 Presence of prosthetic heart valve: Secondary | ICD-10-CM

## 2015-12-08 DIAGNOSIS — Z7901 Long term (current) use of anticoagulants: Secondary | ICD-10-CM | POA: Diagnosis not present

## 2015-12-08 LAB — COAGUCHEK XS/INR WAIVED
INR: 4.5 — ABNORMAL HIGH (ref 0.9–1.1)
PROTHROMBIN TIME: 54.5 s

## 2015-12-08 LAB — PROTIME-INR: INR: 4.5 — AB (ref <=1.1)

## 2015-12-08 NOTE — Patient Outreach (Signed)
Transition of care call attemped Patient was hospitalized from 4/19 to 4/20 Called to patient home number listed in chart. No answer, unable to leave a message. Plan to attempt again next week. Royetta Crochet. Laymond Purser, RN, BSN, [place] RN [phone]

## 2015-12-11 ENCOUNTER — Encounter: Payer: Self-pay | Admitting: *Deleted

## 2015-12-11 ENCOUNTER — Other Ambulatory Visit: Payer: Self-pay | Admitting: *Deleted

## 2015-12-11 NOTE — Patient Outreach (Signed)
Call to patient for transition of Care program  Patient reports he has f/u with PCP this week His daughter Lelan Pons is staying with him for  A few days and is assisting with medication. Patient states he has some edema in his foot, but no shortness of breath. Patient has a scale but is not weighing daily, "just don't weigh"   Assessment:  HF-recent hospitalization for HF  Plan: [facility] CM Care Plan Problem One        Most Recent Value   Care Plan Problem One  Recent admission for HF   Role Documenting the Problem One  Care Management [place] for Problem One  Active   [facility] Long Term Goal (31-90 days)  Patient will not have readmission for HF in the next 60 days   [facility] Long Term Goal Start Date  12/11/15   Interventions for Problem One Long Term Goal  Using teachback reviewed importance of daily weights, reviewed medications, reviewed signs and symptoms of HF and when to notify MD    [facility] CM Care Plan Problem Two        Most Recent Value   Care Plan Problem Two  knowledge deficit related to HF dx as evidenced by questions persented by patient and daughter   Role Documenting the Problem Two  Care Management [place] for Problem Two  Active   Interventions for Problem Two Long Term Goal   Transition of Care program started. Reviewed diet and encouraged low sodium choices   [facility] Long Term Goal (31-90) days  Patient will not be hospitalized for HF dx in next 90 days   [facility] Long Term Goal Start Date  12/11/15   [facility] CM Short Term Goal #1 (0-30 days)  Patient will become familiar with HF zones and be able to recognize symptoms and when to call doctor and 911, in the next 30 days   [facility] CM Short Term Goal #1 Start Date  12/11/15   Interventions for Short Term Goal #2   Reviewed HF zones, when to call MD, encouraged patient to  weigh daily and to monitor for symptoms     Call again next week and schedule HV. Royetta Crochet. Laymond Purser, RN, BSN, [place] [phone]

## 2015-12-13 DIAGNOSIS — J449 Chronic obstructive pulmonary disease, unspecified: Secondary | ICD-10-CM | POA: Diagnosis not present

## 2015-12-13 DIAGNOSIS — I2510 Atherosclerotic heart disease of native coronary artery without angina pectoris: Secondary | ICD-10-CM | POA: Diagnosis not present

## 2015-12-13 DIAGNOSIS — G894 Chronic pain syndrome: Secondary | ICD-10-CM | POA: Diagnosis not present

## 2015-12-13 DIAGNOSIS — G4733 Obstructive sleep apnea (adult) (pediatric): Secondary | ICD-10-CM | POA: Diagnosis not present

## 2015-12-13 DIAGNOSIS — J441 Chronic obstructive pulmonary disease with (acute) exacerbation: Secondary | ICD-10-CM | POA: Diagnosis not present

## 2015-12-13 DIAGNOSIS — I501 Left ventricular failure: Secondary | ICD-10-CM | POA: Diagnosis not present

## 2015-12-18 ENCOUNTER — Other Ambulatory Visit: Payer: Self-pay | Admitting: Cardiovascular Disease

## 2015-12-18 NOTE — Telephone Encounter (Signed)
Rx request sent to pharmacy.  

## 2015-12-19 ENCOUNTER — Other Ambulatory Visit: Payer: Self-pay | Admitting: *Deleted

## 2015-12-19 NOTE — Patient Outreach (Signed)
Transition of Care week #2  Call to patient for transition of care. Patient reporting he is doing very well. He states he was able to "cut some wood" yesterday. He states his daughter is still assisting with medications. Patient denies any new concerns or needs this call. Patient does agree to home visit for assessment next week.  Plan: [facility] CM Care Plan Problem One        Most Recent Value   Care Plan Problem One  Recent admission for HF   Role Documenting the Problem One  Care Management [place] for Problem One  Active   [facility] Long Term Goal (31-90 days)  Patient will not have readmission for HF in the next 60 days   [facility] Long Term Goal Start Date  12/11/15   Interventions for Problem One Long Term Goal  Using teachback reviewed importance of daily weights, reviewed medications, reviewed signs and symptoms of HF and when to notify MD    [facility] CM Care Plan Problem Two        Most Recent Value   Care Plan Problem Two  knowledge deficit related to HF dx as evidenced by questions persented by patient and daughter   Role Documenting the Problem Two  Care Management [place] for Problem Two  Active   Interventions for Problem Two Long Term Goal   Reviewed diet and encouraged low sodium choices   [facility] Long Term Goal (31-90) days  Patient will not be hospitalized for HF dx in next 90 days   [facility] Long Term Goal Start Date  12/11/15   [facility] CM Short Term Goal #1 (0-30 days)  Patient will become familiar with HF zones and be able to recognize symptoms and when to call doctor and 911, in the next 30 days   [facility] CM Short Term Goal #1 Start Date  12/11/15   Interventions for Short Term Goal #2   Reminded of HF zones, when to call MD, encouraged patient to  weigh daily and to monitor for symptoms     Plan to continue patient in transition of care program and visit next week. Royetta Crochet. Laymond Purser, RN, BSN, [place] [phone]

## 2015-12-21 ENCOUNTER — Other Ambulatory Visit: Payer: Self-pay | Admitting: Cardiovascular Disease

## 2015-12-21 DIAGNOSIS — Z954 Presence of other heart-valve replacement: Secondary | ICD-10-CM | POA: Diagnosis not present

## 2015-12-21 DIAGNOSIS — Z7901 Long term (current) use of anticoagulants: Secondary | ICD-10-CM | POA: Diagnosis not present

## 2015-12-21 LAB — COAGUCHEK XS/INR WAIVED
INR: 2.5 — AB (ref 0.9–1.1)
Prothrombin Time: 29.9 s

## 2015-12-25 ENCOUNTER — Ambulatory Visit (INDEPENDENT_AMBULATORY_CARE_PROVIDER_SITE_OTHER): Payer: Medicare Other | Admitting: Pharmacist Clinician (PhC)/ Clinical Pharmacy Specialist

## 2015-12-25 ENCOUNTER — Other Ambulatory Visit: Payer: Self-pay | Admitting: Cardiovascular Disease

## 2015-12-25 DIAGNOSIS — Z954 Presence of other heart-valve replacement: Secondary | ICD-10-CM

## 2015-12-25 DIAGNOSIS — Z7901 Long term (current) use of anticoagulants: Secondary | ICD-10-CM

## 2015-12-25 DIAGNOSIS — Z952 Presence of prosthetic heart valve: Secondary | ICD-10-CM

## 2015-12-25 NOTE — Telephone Encounter (Signed)
Rx Refill

## 2015-12-27 ENCOUNTER — Other Ambulatory Visit: Payer: Self-pay | Admitting: *Deleted

## 2015-12-27 NOTE — Patient Outreach (Signed)
Tanner Pena [facility]) Tanner Pena   12/27/2015  [name] 1941-03-24 [id number]  Tanner Pena is an 75 y.o. male  Subjective:  Patient reports he is doing pretty good. He is trying to get outside some and work in yard some, he states that he stays in his wheelchair, but can walk some short distances.  Patient has f/u appointment with Dr. Suzette Battiest 01/09/16 Patient states his daughter is managing medications and he is unable to review with RNCM, but he will have her call RNCM to review.  Objective:   Neatly groomed and dressed, sitting in electric w/c BP 116/52 mmHg  Pulse 65  Resp 20  SpO2 96% Review of Systems  Constitutional: Negative.   HENT: Negative.   Eyes: Negative.   Respiratory: Positive for cough and sputum production.   Gastrointestinal: Negative.   Genitourinary: Negative.   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       Coumadin    Physical Exam  Constitutional: He is oriented to person, place, and time. He appears well-developed and well-nourished.  Cardiovascular: Normal rate.   Respiratory: Effort normal. He has rales.  GI: Soft. Bowel sounds are normal.  Musculoskeletal: Normal range of motion.  Neurological: He is alert and oriented to person, place, and time.  Skin: Skin is warm and dry.    Encounter Medications:   Outpatient Encounter Prescriptions as of 12/27/2015  Medication Sig  . albuterol (PROVENTIL) 4 MG tablet Take 4 mg by mouth 3 (three) times daily.  [name] albuterol-ipratropium (COMBIVENT) 18-103 MCG/ACT inhaler Inhale 1 puff into the lungs 4 (four) times daily. Coughing/ Shortness of Breath  . allopurinol (ZYLOPRIM) 300 MG tablet Take 300 mg by mouth daily.  [name] azithromycin (ZITHROMAX) 250 MG tablet Take 1 tab po daily  . cholecalciferol (VITAMIN D) 1000 UNITS tablet Take 2,000 Units by mouth daily.  . clopidogrel (PLAVIX) 75 MG tablet TAKE ONE TABLET BY  MOUTH DAILY WITH BREAKFAST.  . fenofibrate (TRICOR) 145 MG tablet TAKE 1 TABLET BY MOUTH ONCE DAILY FOR CHOLESTEROL.  . ferrous sulfate 325 (65 FE) MG EC tablet Take 1 tablet (325 mg total) by mouth daily with breakfast.  . fish oil-omega-3 fatty acids 1000 MG capsule Take 1 capsule (1 g total) by mouth 2 (two) times daily.  . furosemide (LASIX) 80 MG tablet Take 1 tablet (80 mg total) by mouth daily.  [name] glimepiride (AMARYL) 1 MG tablet Take 1 mg by mouth 2 (two) times daily.  . Insulin Detemir (LEVEMIR) 100 UNIT/ML Pen Inject 20 Units into the skin daily.  . Insulin Pen Needle 29G X 12.7MM MISC Use as directed  . isosorbide mononitrate (IMDUR) 60 MG 24 hr tablet Take 1 tablet (60 mg total) by mouth daily.  . metoprolol succinate (TOPROL-XL) 25 MG 24 hr tablet Take 1 tablet in the morning and 1/2 tablet in the evening  . NITROSTAT 0.4 MG SL tablet PLACE 1 TAB UNDER TONGUE EVERY 5 MIN IF NEEDED FOR CHEST PAIN. MAY USE 3 TIMES.NO RELIEF CALL 911.  [name] oxyCODONE-acetaminophen (PERCOCET) 10-325 MG per tablet Take 1 tablet by mouth every 4 (four) hours as needed for pain. (Patient taking differently: Take 1 tablet by mouth every 3 (three) hours as needed for pain. )  . pantoprazole (PROTONIX) 40 MG tablet Take 1 tablet (40 mg total) by mouth 2 (two) times daily before a meal.  . potassium chloride SA (K-DUR,KLOR-CON)  20 MEQ tablet Take 20 mEq by mouth daily.    . predniSONE (DELTASONE) 10 MG tablet Take '40mg'$  po daily for 2 days then '30mg'$  daily for 2 days then '20mg'$  daily for 2 days then '10mg'$  daily for 2 days then stop  . pregabalin (LYRICA) 50 MG capsule Take one capsule by mouth twice daily for pains (Patient taking differently: Take 50 mg by mouth 2 (two) times daily. )  . ramipril (ALTACE) 5 MG capsule TAKE 1 CAPSULE BY MOUTH EVERY MORNING.  . rosuvastatin (CRESTOR) 20 MG tablet TAKE (1) TABLET BY MOUTH AT BEDTIME FOR CHOLESTEROL.  [name] sitaGLIPtin (JANUVIA) 50 MG tablet Take 50 mg by mouth daily.  .  vitamin C (ASCORBIC ACID) 500 MG tablet Take 500 mg by mouth daily.  [name] warfarin (COUMADIN) 5 MG tablet Take 1 tablet (5 mg total) by mouth daily at 6 PM. (Patient taking differently: Take 5 mg by mouth daily at 6 PM. Take 5 mg on Monday and Friday and 2.5 mg all other days.)   No facility-administered encounter medications on file as of 12/27/2015.    Functional Status:   In your present state of health, do you have any difficulty performing the following activities: 12/11/2015 12/06/2015  Hearing? - N  Vision? - N  Difficulty concentrating or making decisions? - N  Walking or climbing stairs? - N  Dressing or bathing? - N  Doing errands, shopping? - N  Conservation officer, nature and eating ? Y -  Using the Toilet? N -  In the past six months, have you accidently leaked urine? N -  Do you have problems with loss of bowel control? N -  Managing your Medications? Y -  Managing your Finances? N -  Housekeeping or managing your Housekeeping? Y -    Fall/Depression Screening:    Fall Risk  12/27/2015 12/19/2015 08/23/2015 08/11/2015 07/19/2015  Falls in the past year? No No No No No  Risk for fall due to : Impaired balance/gait Impaired balance/gait Impaired balance/gait Impaired balance/gait Impaired balance/gait  Risk for fall due to (comments): - - - - -   PHQ 2/9 Scores 12/19/2015 08/11/2015 07/19/2015 06/08/2015  PHQ - 2 Score 0 0 0 0    Assessment:   HF: encouraged patient to attempt to weigh daily, and to self monitor for HF symptoms.   Plan:  [facility] CM Tanner Plan Problem One        Most Recent Value   Tanner Plan Problem One  Recent admission for HF   Role Documenting the Problem One  Tanner Pena [place] for Problem One  Active   [facility] Long Term Goal (31-90 days)  Patient will not have readmission for HF in the next 60 days   [facility] Long Term Goal Start Date  12/11/15   Interventions for Problem One Long Term Goal  Using teachback reviewed importance of daily weights, reviewed  medications, reviewed signs and symptoms of HF and when to notify MD    [facility] CM Tanner Plan Problem Two        Most Recent Value   Tanner Plan Problem Two  knowledge deficit related to HF dx as evidenced by questions persented by patient and daughter   Role Documenting the Problem Two  Tanner Pena Tanner [place] for Problem Two  Active   Interventions for Problem Two Long Term Goal   Encouraged low sodium choices   [facility] Long Term Goal (31-90) days  Patient  will not be hospitalized for HF dx in next 90 days   [facility] Long Term Goal Start Date  12/11/15   [facility] CM Short Term Goal #1 (0-30 days)  Patient will become familiar with HF zones and be able to recognize symptoms and when to call doctor and 911, in the next 30 days   [facility] CM Short Term Goal #1 Start Date  12/11/15   Interventions for Short Term Goal #2   Reviwed HF zones, when to call MD, encouraged patient to  weigh daily and to monitor for symptoms     Patient to ask daughter to contact RNCM with medication, as patient daughter manages medications for patient Will continue transition of Tanner program calls and visits  Royetta Crochet. Laymond Purser, RN, BSN, [place] [phone]

## 2016-01-01 ENCOUNTER — Other Ambulatory Visit: Payer: Self-pay | Admitting: *Deleted

## 2016-01-01 NOTE — Patient Outreach (Signed)
VM received from patient daughter, Lelan Pons. Call back to [facility], no answer, left VM requesting call back. Royetta Crochet. Laymond Purser, RN, BSN, [place] [phone]

## 2016-01-02 NOTE — Patient Outreach (Signed)
Call from patient daughter, Lelan Pons  She was able to go through and review patient medications with this RNCM Patient was recently discharged from hospital and all medications have been reviewed. Plan to continue transition of care calls and will visit in June. Royetta Crochet. Laymond Purser, RN, BSN, [place] [phone]

## 2016-01-02 NOTE — Addendum Note (Signed)
Addended by: Burgess Amor E on: 01/02/2016 04:13 PM   Modules accepted: Medications

## 2016-01-02 NOTE — Patient Outreach (Signed)
Transition of care call. Spoke with patient. Patient states he doing pretty well, he has been able to work some outside. He was inside taking a break. Patient states weight up 1# to 211# that is with prosthesis. Patient states his sugars are running in low 200's, same amount insulin, has lab work on Thursday.  Patient has just gotten new batteries for his electric wheelchair, states "they were outrageous".  Patient states no new concerns or issues verbalized this call  RNCM reminded of continued daily weights.  RNCM discussed diet and watching carbs, patient agrees to take CBG log to appointment with Dr. Suzette Battiest. RNCM requested that patient have daughter call to review medications, patient agrees to have her call RNCM explained that patient could contact his Medicare plan regarding batteries, to check and see if the plan would cover some of the cost of the replacement batteries.  Plan to continue in transition of care program and contact next week. Royetta Crochet. Laymond Purser, RN, BSN, [place] [phone]

## 2016-01-05 NOTE — Addendum Note (Signed)
Addended by: Burgess Amor E on: 01/05/2016 10:58 AM   Modules accepted: Medications

## 2016-01-09 DIAGNOSIS — E1165 Type 2 diabetes mellitus with hyperglycemia: Secondary | ICD-10-CM | POA: Diagnosis not present

## 2016-01-09 DIAGNOSIS — I10 Essential (primary) hypertension: Secondary | ICD-10-CM | POA: Diagnosis not present

## 2016-01-09 DIAGNOSIS — E7800 Pure hypercholesterolemia, unspecified: Secondary | ICD-10-CM | POA: Diagnosis not present

## 2016-01-12 ENCOUNTER — Other Ambulatory Visit: Payer: Self-pay | Admitting: *Deleted

## 2016-01-12 ENCOUNTER — Other Ambulatory Visit: Payer: Self-pay | Admitting: Cardiovascular Disease

## 2016-01-12 DIAGNOSIS — Z7901 Long term (current) use of anticoagulants: Secondary | ICD-10-CM | POA: Diagnosis not present

## 2016-01-12 DIAGNOSIS — Z954 Presence of other heart-valve replacement: Secondary | ICD-10-CM | POA: Diagnosis not present

## 2016-01-12 DIAGNOSIS — I10 Essential (primary) hypertension: Secondary | ICD-10-CM | POA: Diagnosis not present

## 2016-01-12 LAB — COAGUCHEK XS/INR WAIVED
INR: 1.5 — AB (ref 0.9–1.1)
Prothrombin Time: 17.5 s

## 2016-01-12 NOTE — Patient Outreach (Signed)
Final Transition of care attempted No answer to phone, unable to leave message. Plan to make outreach visit in June. Royetta Crochet. Laymond Purser, RN, BSN, [place] [phone]

## 2016-01-16 ENCOUNTER — Ambulatory Visit (INDEPENDENT_AMBULATORY_CARE_PROVIDER_SITE_OTHER): Payer: Medicare Other | Admitting: Pharmacist Clinician (PhC)/ Clinical Pharmacy Specialist

## 2016-01-16 DIAGNOSIS — Z952 Presence of prosthetic heart valve: Secondary | ICD-10-CM

## 2016-01-16 DIAGNOSIS — Z7901 Long term (current) use of anticoagulants: Secondary | ICD-10-CM

## 2016-01-16 DIAGNOSIS — Z954 Presence of other heart-valve replacement: Secondary | ICD-10-CM

## 2016-01-24 ENCOUNTER — Encounter: Payer: Self-pay | Admitting: *Deleted

## 2016-01-24 ENCOUNTER — Other Ambulatory Visit: Payer: Self-pay | Admitting: *Deleted

## 2016-01-24 NOTE — Patient Outreach (Signed)
[facility]) Care Management   01/24/2016  NADIA TORR 06-10-1941 [id number]  Tanner Pena is an 75 y.o. male  Subjective:  "I am doing pretty well" "can you check my meter"  Patient reports daughter still managing medications. Patient checking blood sugars twice a day, had an error on machine yesterday. Patient denies any new concerns or needs for [facility] Patient does not want to be referred to telephonic disease or case management.  Patient using manual wheelchair more now and using scooter less.   Objective:   BP 112/60 mmHg  Pulse 64  Resp 20  Wt 200 lb (90.719 kg)  SpO2 96% Review of Systems  Constitutional: Negative.   HENT: Negative.   Eyes: Negative.   Respiratory: Negative.   Gastrointestinal: Negative.   Genitourinary: Positive for frequency.       Diuretic use  Musculoskeletal: Negative.   Skin: Negative.   Neurological: Negative.   Endo/Heme/Allergies: Bruises/bleeds easily.       Blood thinner use  Psychiatric/Behavioral: Negative.     Physical Exam  Constitutional: He is oriented to person, place, and time. He appears well-developed.  Neck: Normal range of motion.  Cardiovascular: Normal rate and regular rhythm.   Heart valve  GI: Soft. Bowel sounds are normal.  Musculoskeletal:  Uses walker, wheelchair  Neurological: He is alert and oriented to person, place, and time.  Skin: Skin is warm and dry.    Encounter Medications:   Outpatient Encounter Prescriptions as of 01/24/2016  Medication Sig Note  . albuterol (PROVENTIL) 4 MG tablet Take 4 mg by mouth 3 (three) times daily.   [name] albuterol-ipratropium (COMBIVENT) 18-103 MCG/ACT inhaler Inhale 1 puff into the lungs 4 (four) times daily. Coughing/ Shortness of Breath   . allopurinol (ZYLOPRIM) 300 MG tablet Take 300 mg by mouth daily.   [name] azithromycin (ZITHROMAX) 250 MG tablet Take 1 tab po daily (Patient not taking: Reported on 01/02/2016)   . cholecalciferol (VITAMIN D)  1000 UNITS tablet Take 2,000 Units by mouth daily.   . clopidogrel (PLAVIX) 75 MG tablet TAKE ONE TABLET BY MOUTH DAILY WITH BREAKFAST. (Patient not taking: Reported on 01/02/2016) 01/05/2016: Patient has not been taking for 6 months per daughter Lelan Pons  . fenofibrate (TRICOR) 145 MG tablet TAKE 1 TABLET BY MOUTH ONCE DAILY FOR CHOLESTEROL.   . ferrous sulfate 325 (65 FE) MG EC tablet Take 1 tablet (325 mg total) by mouth daily with breakfast. (Patient not taking: Reported on 01/02/2016)   . fish oil-omega-3 fatty acids 1000 MG capsule Take 1 capsule (1 g total) by mouth 2 (two) times daily.   . furosemide (LASIX) 80 MG tablet Take 1 tablet (80 mg total) by mouth daily.   [name] glimepiride (AMARYL) 1 MG tablet Take 1 mg by mouth 2 (two) times daily. 01/02/2016: Increased to 2 tablets daily  . Insulin Detemir (LEVEMIR) 100 UNIT/ML Pen Inject 20 Units into the skin daily. 01/02/2016: 30u bid  . Insulin Pen Needle 29G X 12.7MM MISC Use as directed   . isosorbide mononitrate (IMDUR) 60 MG 24 hr tablet Take 1 tablet (60 mg total) by mouth daily.   . metoprolol succinate (TOPROL-XL) 25 MG 24 hr tablet Take 1 tablet in the morning and 1/2 tablet in the evening   . NITROSTAT 0.4 MG SL tablet PLACE 1 TAB UNDER TONGUE EVERY 5 MIN IF NEEDED FOR CHEST PAIN. MAY USE 3 TIMES.NO RELIEF CALL 911.   [name] oxyCODONE-acetaminophen (PERCOCET) 10-325 MG per tablet Take  1 tablet by mouth every 4 (four) hours as needed for pain. (Patient taking differently: Take 1 tablet by mouth every 3 (three) hours as needed for pain. )   . pantoprazole (PROTONIX) 40 MG tablet Take 1 tablet (40 mg total) by mouth 2 (two) times daily before a meal.   . potassium chloride SA (K-DUR,KLOR-CON) 20 MEQ tablet Take 20 mEq by mouth daily.     . predniSONE (DELTASONE) 10 MG tablet Take '40mg'$  po daily for 2 days then '30mg'$  daily for 2 days then '20mg'$  daily for 2 days then '10mg'$  daily for 2 days then stop (Patient not taking: Reported on 01/02/2016)   . pregabalin  (LYRICA) 50 MG capsule Take one capsule by mouth twice daily for pains (Patient taking differently: Take 50 mg by mouth 2 (two) times daily. ) 01/02/2016: 3 times a day  . ramipril (ALTACE) 5 MG capsule TAKE 1 CAPSULE BY MOUTH EVERY MORNING. 01/02/2016: 2.'5mg'$    . rosuvastatin (CRESTOR) 20 MG tablet TAKE (1) TABLET BY MOUTH AT BEDTIME FOR CHOLESTEROL.   [name] sitaGLIPtin (JANUVIA) 50 MG tablet Take 50 mg by mouth daily.   . vitamin C (ASCORBIC ACID) 500 MG tablet Take 500 mg by mouth daily.   [name] warfarin (COUMADIN) 5 MG tablet Take 1 tablet (5 mg total) by mouth daily at 6 PM. (Patient taking differently: Take 5 mg by mouth daily at 6 PM. Take 5 mg on Monday and Friday and 2.5 mg all other days.) 01/02/2016: Take '5mg'$  whole on M, 1/2 on other days   No facility-administered encounter medications on file as of 01/24/2016.       Assessment:   HF: Patient denies any symptoms, he is aware of zones and symptoms to watch and report Diabetes: Patient has issue with meter, looked at meter, changed battery and reset it for patient and he was able to check blood sugar. Medication management: Patient daughter still managing patient medications. Getting PT/INR checked at lab and being managed closely.   Plan:  Patient agrees to call RNCM with any new CM concerns. Patient agrees to discharge, does not want referral to Health Coach or telephonic RNCM will close case per protocol  Stanton Kidney E. Laymond Purser, RN, BSN, [place] [phone]

## 2016-01-29 ENCOUNTER — Ambulatory Visit (INDEPENDENT_AMBULATORY_CARE_PROVIDER_SITE_OTHER): Payer: Medicare Other | Admitting: Pharmacist

## 2016-01-29 ENCOUNTER — Other Ambulatory Visit: Payer: Self-pay | Admitting: Cardiovascular Disease

## 2016-01-29 DIAGNOSIS — Z954 Presence of other heart-valve replacement: Secondary | ICD-10-CM

## 2016-01-29 DIAGNOSIS — Z7901 Long term (current) use of anticoagulants: Secondary | ICD-10-CM

## 2016-01-29 DIAGNOSIS — Z952 Presence of prosthetic heart valve: Secondary | ICD-10-CM

## 2016-01-29 LAB — COAGUCHEK XS/INR WAIVED
INR: 1.9 — AB (ref 0.9–1.1)
Prothrombin Time: 22.4 s

## 2016-02-01 ENCOUNTER — Telehealth: Payer: Self-pay | Admitting: Pharmacist

## 2016-02-01 ENCOUNTER — Ambulatory Visit: Payer: Self-pay | Admitting: Pharmacist

## 2016-02-01 DIAGNOSIS — Z7901 Long term (current) use of anticoagulants: Secondary | ICD-10-CM

## 2016-02-01 DIAGNOSIS — Z952 Presence of prosthetic heart valve: Secondary | ICD-10-CM

## 2016-02-01 NOTE — Telephone Encounter (Signed)
Spoke to pt daughter who states pt did not remember if we called to dose his coumadin after being checked on Monday. Gave her instructions to take boosted dose today since pt did not take on Monday. Also added note to anticoag track to call and speak with daughter or leave message on machine to dose coumadin in future. Ok per [employer].

## 2016-02-12 ENCOUNTER — Other Ambulatory Visit: Payer: Self-pay | Admitting: Cardiovascular Disease

## 2016-02-12 DIAGNOSIS — Z7901 Long term (current) use of anticoagulants: Secondary | ICD-10-CM | POA: Diagnosis not present

## 2016-02-12 DIAGNOSIS — Z954 Presence of other heart-valve replacement: Secondary | ICD-10-CM | POA: Diagnosis not present

## 2016-02-12 LAB — COAGUCHEK XS/INR WAIVED
INR: 1.9 — ABNORMAL HIGH (ref 0.9–1.1)
Prothrombin Time: 22.3 s

## 2016-02-15 ENCOUNTER — Ambulatory Visit (INDEPENDENT_AMBULATORY_CARE_PROVIDER_SITE_OTHER): Payer: Medicare Other | Admitting: Pharmacist Clinician (PhC)/ Clinical Pharmacy Specialist

## 2016-02-15 DIAGNOSIS — Z7901 Long term (current) use of anticoagulants: Secondary | ICD-10-CM

## 2016-02-15 DIAGNOSIS — Z952 Presence of prosthetic heart valve: Secondary | ICD-10-CM

## 2016-02-15 DIAGNOSIS — Z954 Presence of other heart-valve replacement: Secondary | ICD-10-CM

## 2016-02-16 ENCOUNTER — Other Ambulatory Visit: Payer: Self-pay | Admitting: Cardiovascular Disease

## 2016-03-08 ENCOUNTER — Ambulatory Visit (HOSPITAL_COMMUNITY)
Admission: RE | Admit: 2016-03-08 | Discharge: 2016-03-08 | Disposition: A | Discharge status: Home / Self Care | Payer: Medicare Other | Source: Ambulatory Visit | Attending: Internal Medicine | Admitting: Internal Medicine

## 2016-03-08 ENCOUNTER — Other Ambulatory Visit (HOSPITAL_COMMUNITY): Payer: Self-pay | Admitting: Internal Medicine

## 2016-03-08 DIAGNOSIS — M75102 Unspecified rotator cuff tear or rupture of left shoulder, not specified as traumatic: Secondary | ICD-10-CM | POA: Diagnosis not present

## 2016-03-08 DIAGNOSIS — S4992XA Unspecified injury of left shoulder and upper arm, initial encounter: Secondary | ICD-10-CM | POA: Insufficient documentation

## 2016-03-08 DIAGNOSIS — X58XXXA Exposure to other specified factors, initial encounter: Secondary | ICD-10-CM | POA: Insufficient documentation

## 2016-03-08 DIAGNOSIS — M25512 Pain in left shoulder: Secondary | ICD-10-CM | POA: Diagnosis not present

## 2016-03-08 DIAGNOSIS — Z1389 Encounter for screening for other disorder: Secondary | ICD-10-CM | POA: Diagnosis not present

## 2016-03-22 DIAGNOSIS — E119 Type 2 diabetes mellitus without complications: Secondary | ICD-10-CM | POA: Diagnosis not present

## 2016-03-29 ENCOUNTER — Telehealth: Payer: Self-pay | Admitting: Pharmacist

## 2016-03-29 NOTE — Telephone Encounter (Signed)
Called to follow up as over due to INR check.

## 2016-04-15 ENCOUNTER — Other Ambulatory Visit: Payer: Self-pay | Admitting: Cardiovascular Disease

## 2016-04-15 NOTE — Telephone Encounter (Signed)
Rx(s) sent to pharmacy electronically.  

## 2016-04-19 ENCOUNTER — Other Ambulatory Visit: Payer: Self-pay | Admitting: Cardiovascular Disease

## 2016-04-19 DIAGNOSIS — Z954 Presence of other heart-valve replacement: Secondary | ICD-10-CM | POA: Diagnosis not present

## 2016-04-19 DIAGNOSIS — Z7901 Long term (current) use of anticoagulants: Secondary | ICD-10-CM | POA: Diagnosis not present

## 2016-04-19 LAB — COAGUCHEK XS/INR WAIVED
INR: 2.1 — AB (ref 0.9–1.1)
Prothrombin Time: 25.3 s

## 2016-04-23 ENCOUNTER — Ambulatory Visit (INDEPENDENT_AMBULATORY_CARE_PROVIDER_SITE_OTHER): Payer: Medicare Other | Admitting: Pharmacist Clinician (PhC)/ Clinical Pharmacy Specialist

## 2016-04-23 DIAGNOSIS — Z952 Presence of prosthetic heart valve: Secondary | ICD-10-CM

## 2016-04-23 DIAGNOSIS — Z954 Presence of other heart-valve replacement: Secondary | ICD-10-CM

## 2016-04-23 DIAGNOSIS — Z7901 Long term (current) use of anticoagulants: Secondary | ICD-10-CM

## 2016-05-08 ENCOUNTER — Other Ambulatory Visit: Payer: Self-pay | Admitting: Cardiovascular Disease

## 2016-05-08 DIAGNOSIS — Z954 Presence of other heart-valve replacement: Secondary | ICD-10-CM | POA: Diagnosis not present

## 2016-05-08 DIAGNOSIS — Z7901 Long term (current) use of anticoagulants: Secondary | ICD-10-CM | POA: Diagnosis not present

## 2016-05-08 LAB — COAGUCHEK XS/INR WAIVED
INR: 3.9 — ABNORMAL HIGH (ref 0.9–1.1)
Prothrombin Time: 47.1 s

## 2016-05-10 ENCOUNTER — Other Ambulatory Visit: Payer: Self-pay | Admitting: Cardiovascular Disease

## 2016-05-13 ENCOUNTER — Ambulatory Visit (INDEPENDENT_AMBULATORY_CARE_PROVIDER_SITE_OTHER): Payer: Medicare Other | Admitting: Pharmacist

## 2016-05-13 DIAGNOSIS — Z954 Presence of other heart-valve replacement: Secondary | ICD-10-CM

## 2016-05-13 DIAGNOSIS — Z7901 Long term (current) use of anticoagulants: Secondary | ICD-10-CM

## 2016-05-13 DIAGNOSIS — Z952 Presence of prosthetic heart valve: Secondary | ICD-10-CM

## 2016-05-30 ENCOUNTER — Other Ambulatory Visit: Payer: Self-pay | Admitting: Interventional Radiology

## 2016-05-30 DIAGNOSIS — K551 Chronic vascular disorders of intestine: Secondary | ICD-10-CM

## 2016-06-05 DIAGNOSIS — Z1389 Encounter for screening for other disorder: Secondary | ICD-10-CM | POA: Diagnosis not present

## 2016-06-05 DIAGNOSIS — J449 Chronic obstructive pulmonary disease, unspecified: Secondary | ICD-10-CM | POA: Diagnosis not present

## 2016-06-05 DIAGNOSIS — I7389 Other specified peripheral vascular diseases: Secondary | ICD-10-CM | POA: Diagnosis not present

## 2016-06-05 DIAGNOSIS — I2510 Atherosclerotic heart disease of native coronary artery without angina pectoris: Secondary | ICD-10-CM | POA: Diagnosis not present

## 2016-06-05 DIAGNOSIS — G894 Chronic pain syndrome: Secondary | ICD-10-CM | POA: Diagnosis not present

## 2016-06-05 DIAGNOSIS — J328 Other chronic sinusitis: Secondary | ICD-10-CM | POA: Diagnosis not present

## 2016-06-05 DIAGNOSIS — I10 Essential (primary) hypertension: Secondary | ICD-10-CM | POA: Diagnosis not present

## 2016-06-05 DIAGNOSIS — Z0000 Encounter for general adult medical examination without abnormal findings: Secondary | ICD-10-CM | POA: Diagnosis not present

## 2016-06-05 DIAGNOSIS — Z23 Encounter for immunization: Secondary | ICD-10-CM | POA: Diagnosis not present

## 2016-06-06 ENCOUNTER — Ambulatory Visit
Admission: RE | Admit: 2016-06-06 | Discharge: 2016-06-06 | Disposition: A | Discharge status: Home / Self Care | Payer: Medicare Other | Source: Ambulatory Visit | Attending: Interventional Radiology | Admitting: Interventional Radiology

## 2016-06-06 DIAGNOSIS — K551 Chronic vascular disorders of intestine: Secondary | ICD-10-CM | POA: Diagnosis not present

## 2016-06-06 HISTORY — PX: IR GENERIC HISTORICAL: IMG1180011

## 2016-06-06 NOTE — Progress Notes (Signed)
Chief Complaint: Chronic Mesenteric Ischemia  Referring Physician(s): Laban Emperor   History of Present Illness: Tanner Pena is a 75 y.o. 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  He returns today with his family for the appointment. He is pleased to say that he has not had any typical symptoms of recurrent chronic mesenteric ischemia. He feels he has been having a good appetite recently, and has been gaining weight and not losing weight. He denies any food fear symptoms or symptoms of crampy abdominal pain after meals.  He denies any recent GI intestinal bleeding or hematemesis.  He is currently taking Coumadin for artificial heart valve, and he continues now without antiplatelet medication given that he has been proven to have gastric ulcers. His last upper endoscopy with Dr. [name] was 10/04/2015, and his last colonoscopy with Dr. [name] was 06/01/2015.  Our last diagnostic study was performed 11/29/2015, with patency of the stent maintained. Duplex is difficult given his body habitus, although he has renal insufficiency which complicates the decision of any further contrast enhanced cross-sectional imaging.  Past Medical History:  Diagnosis Date  . Acute blood loss anemia 02/2015 & 05/2015   Hemoglobin 5.5 on 05/31/15; status post transfusion.  . Arthritis   . CAD (coronary artery disease)    a. 05/13/14 [name] s/p overlapping DESx2 to SVG to RCA. b.  s/p CABG '90 with redo '94 & stent to RCA SVG in 2005  . Chronic back pain   . Chronic systolic heart failure ([place])   . Chronic toe ulcer ([place])    a. Left foot  . COPD (chronic obstructive pulmonary disease) ([place])   . Critical lower limb ischemia   . Dysphagia   . Essential hypertension   . GERD  (gastroesophageal reflux disease)   . GI bleeding 05/31/2015   Source not identified.  . Gout   . History of kidney stones   . Hypercholesteremia   . Myocardial infarction   . Neuromuscular disorder ([place])   . Peripheral neuropathy ([place])   . Peripheral vascular disease ([place])    Lower extremity PCI/stenting  . S/P aortic valve replacement 1990   a. St. Jude  . Sleep apnea   . Type 2 diabetes mellitus ([place]) 2007    Past Surgical History:  Procedure Laterality Date  . AMPUTATION Left 07/13/2014   Procedure: Transmetatarsal Amputation;  Surgeon: [name], MD;  Location: [place];  Service: Orthopedics;  Laterality: Left;  . AMPUTATION Left 08/20/2014   Procedure: Revision Transmetatarsal Amputation versus Below Knee Amputation;  Surgeon: [name], MD;  Location: [place];  Service: Orthopedics;  Laterality: Left;  . ANGIOPLASTY ILLIAC ARTERY    . AORTIC VALVE REPLACEMENT  1990   St. Jude  . BACK SURGERY  [phone]   2  . BIOPSY N/A 06/20/2015   Procedure: BIOPSY;  Surgeon: Danie Binder, MD;  Location: [facility];  Service: Endoscopy;  Laterality: N/A;  . CATARACT EXTRACTION     bilateral  . COLONOSCOPY  05/2011   Dr. [name]: benign rectal polyp, left sided tics, ascending colonic ulcers (path c/w ischemia)  . COLONOSCOPY WITH PROPOFOL N/A 06/01/2015   RMR: normal appearing rectal mucosa. Scattered left-sided diverticula. the remainder of the colonic mucosa appeared normal. the distal 5 cm of terminal ileal mucosa also appeared normal. retroflexion was performed.   [name]  CORONARY ARTERY BYPASS GRAFT  1994   6 vessels  . CORONARY STENT PLACEMENT  2005   RCA vein graft A 3.0x13.0 TAXUS stent was then placed int he vessel a Viva 3.0x4.0 (perfusion balloon was made ready it was placed through the entire lenght of the stent  . ESOPHAGOGASTRODUODENOSCOPY N/A 02/09/2015   DR. Schooler: Normal EGD  . ESOPHAGOGASTRODUODENOSCOPY  05/2011   Dr. [name]: s/p esophageal dilation, antral  erosions/nodularity with benign biopsies  . ESOPHAGOGASTRODUODENOSCOPY N/A 10/04/2015   Procedure: ESOPHAGOGASTRODUODENOSCOPY (EGD);  Surgeon: Doran Stabler, MD;  Location[facility] ENDOSCOPY;  Service: Endoscopy;  Laterality: N/A;  . ESOPHAGOGASTRODUODENOSCOPY (EGD) WITH PROPOFOL N/A 06/20/2015   Procedure: ESOPHAGOGASTRODUODENOSCOPY (EGD) WITH PROPOFOL;  Surgeon: Danie Binder, MD;  Location: [facility];  Service: Endoscopy;  Laterality: N/A;  . EYE SURGERY    . GIVENS CAPSULE STUDY N/A 06/08/2015   Procedure: GIVENS CAPSULE STUDY;  Surgeon: [name], MD;  [facility];  Service: Endoscopy;  Laterality: N/A;  0700   . LEFT HEART CATHETERIZATION WITH CORONARY ANGIOGRAM N/A 05/11/2014   Procedure: LEFT HEART CATHETERIZATION WITH CORONARY ANGIOGRAM;  Surgeon: [name], MD;  Location: [facility] CATH LAB;  Service: Cardiovascular;  Laterality: N/A;  . LOWER EXTREMITY ANGIOGRAM N/A 02/15/2013   Procedure: LOWER EXTREMITY ANGIOGRAM;  Surgeon: [name], MD;  Location: [facility] CATH LAB;  Service: Cardiovascular;  Laterality: N/A;  . LOWER EXTREMITY ANGIOGRAM N/A 06/06/2014   Procedure: LOWER EXTREMITY ANGIOGRAM;  Surgeon: [name], MD;  Location: [facility] CATH LAB;  Service: Cardiovascular;  Laterality: N/A;  . [name]  06/13/2011   Procedure: Venia Minks DILATION;  Surgeon: [name], MD;  Location: [facility];  Service: Endoscopy;  Laterality: N/A;  Dilated to 56.   [name] PERCUTANEOUS CORONARY STENT INTERVENTION (PCI-S) N/A 05/13/2014   Procedure: PERCUTANEOUS CORONARY STENT INTERVENTION (PCI-S);  Surgeon: [name], MD;  Location: [facility] CATH LAB;  Service: Cardiovascular;  Laterality: N/A;  . Peripheral vascular procedures lower extremities     Right external iliac  artery PTA and stenting as well as bilateral SFA intervention remotely. Repeat procedures in 2011 bilaterally  . ROTATOR CUFF REPAIR     right  . STUMP REVISION Left 09/23/2014   Procedure: Revision Left  Below Knee Amputation;  Surgeon: [name], MD;  Location: [place];  Service: Orthopedics;  Laterality: Left;  . STUMP REVISION Left 10/13/2014   Procedure: REVISION LEFT BELOW KNEE AMPUTATION STUMP;  Surgeon: [name], MD;  Location: [facility];  Service: Orthopedics;  Laterality: Left;    Allergies: Review of patient's allergies indicates no known allergies.  Medications: Prior to Admission medications   Medication Sig Start Date End Date Taking? Authorizing Provider  albuterol (PROVENTIL) 4 MG tablet Take 4 mg by mouth 3 (three) times daily. 01/20/15  Yes Historical Provider, MD  albuterol-ipratropium (COMBIVENT) 18-103 MCG/ACT inhaler Inhale 1 puff into the lungs 4 (four) times daily. Coughing/ Shortness of Breath   Yes Historical Provider, MD  allopurinol (ZYLOPRIM) 300 MG tablet Take 300 mg by mouth daily.   Yes Historical Provider, MD  cholecalciferol (VITAMIN D) 1000 UNITS tablet Take 2,000 Units by mouth daily.   Yes Historical Provider, MD  fenofibrate (TRICOR) 145 MG tablet TAKE 1 TABLET BY MOUTH ONCE DAILY FOR CHOLESTEROL. 05/10/16  Yes Troy Sine, MD  ferrous sulfate 325 (65 FE) MG EC tablet Take 1 tablet (325 mg total) by mouth daily with breakfast. 06/02/15  Yes Langley Gauss  Fisher, MD  fish oil-omega-3 fatty acids 1000 MG capsule Take 1 capsule (1 g total) by mouth 2 (two) times daily. 01/22/13  Yes Kristin L Alvstad, RPH-CPP  furosemide (LASIX) 80 MG tablet Take 1 tablet (80 mg total) by mouth daily. 12/07/15  Yes Kathie Dike, MD  glimepiride (AMARYL) 1 MG tablet Take 1 mg by mouth 2 (two) times daily.   Yes Historical Provider, MD  Insulin Detemir (LEVEMIR) 100 UNIT/ML Pen Inject 20 Units into the skin daily. 12/07/15  Yes Kathie Dike, MD  Insulin Pen Needle 29G X 12.7MM MISC Use as directed 12/07/15  Yes Kathie Dike, MD  isosorbide mononitrate (IMDUR) 60 MG 24 hr tablet Take 1 tablet (60 mg total) by mouth daily. 07/27/15  Yes Troy Sine, MD  metoprolol  succinate (TOPROL-XL) 25 MG 24 hr tablet Take 1 tablet in the morning and 1/2 tablet in the evening 11/07/15  Yes Troy Sine, MD  NITROSTAT 0.4 MG SL tablet PLACE 1 TAB UNDER TONGUE EVERY 5 MIN IF NEEDED FOR CHEST PAIN. MAY USE 3 TIMES.NO RELIEF CALL 911. 07/20/15  Yes Eileen Stanford, PA-C  oxyCODONE-acetaminophen (PERCOCET) 10-325 MG per tablet Take 1 tablet by mouth every 4 (four) hours as needed for pain. Patient taking differently: Take 1 tablet by mouth every 3 (three) hours as needed for pain.  09/23/14  Yes [name], MD  pantoprazole (PROTONIX) 40 MG tablet Take 1 tablet (40 mg total) by mouth 2 (two) times daily before a meal. 06/23/15  Yes Estela Leonie Green, MD  potassium chloride SA (K-DUR,KLOR-CON) 20 MEQ tablet Take 20 mEq by mouth daily.     Yes Historical Provider, MD  pregabalin (LYRICA) 50 MG capsule Take one capsule by mouth twice daily for pains Patient taking differently: Take 50 mg by mouth 2 (two) times daily.  08/23/14  Yes Mahima Pandey, MD  ramipril (ALTACE) 5 MG capsule TAKE 1 CAPSULE BY MOUTH EVERY MORNING. 02/16/16  Yes [name], MD  rosuvastatin (CRESTOR) 20 MG tablet TAKE (1) TABLET BY MOUTH AT BEDTIME FOR CHOLESTEROL. 05/10/16  Yes Troy Sine, MD  sitaGLIPtin (JANUVIA) 50 MG tablet Take 50 mg by mouth daily.   Yes Historical Provider, MD  vitamin C (ASCORBIC ACID) 500 MG tablet Take 500 mg by mouth daily.   Yes Historical Provider, MD  warfarin (COUMADIN) 5 MG tablet Take 1 tablet (5 mg total) by mouth daily at 6 PM. Patient taking differently: Take 5 mg by mouth daily at 6 PM. Take 5 mg on Monday and Friday and 2.5 mg all other days. 10/14/15  Yes Florencia Reasons, MD  azithromycin (ZITHROMAX) 250 MG tablet Take 1 tab po daily Patient not taking: Reported on 06/06/2016 12/07/15   Kathie Dike, MD  clopidogrel (PLAVIX) 75 MG tablet TAKE ONE TABLET BY MOUTH DAILY WITH BREAKFAST. Patient not taking: Reported on 06/06/2016 12/18/15   Troy Sine, MD    predniSONE (DELTASONE) 10 MG tablet Take '40mg'$  po daily for 2 days then '30mg'$  daily for 2 days then '20mg'$  daily for 2 days then '10mg'$  daily for 2 days then stop Patient not taking: Reported on 06/06/2016 12/07/15   Kathie Dike, MD     Family History  Problem Relation Age of Onset  . Colon cancer Neg Hx   . Liver disease Neg Hx   . Diabetes Father   . Heart failure Other     Social History   Social History  . Marital status:  Legally Separated    Spouse name: N/A  . Number of children: 5  . Years of education: N/A   Occupational History  . 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 Smoking status: Former Smoker    Packs/day: 1.00    Years: 55.00    Types: Cigarettes    Start date: 08/19/1953    Quit date: 05/07/2014  . Smokeless tobacco: Current User    Types: Chew    Last attempt to quit: 08/20/1995     Comment: Has quit on 3 occasions. Counseling given today 5-10 minutes   I am more than likely going to quit "  . Alcohol use No     Comment: Socially, sometimes 12 ounce beer daily, may go month without/no whiskey  . Drug use: No  . Sexual activity: Not Currently   Other Topics Concern  . Not on file   Social History Narrative   Has 3 daughters   Has 2 sons    Review of Systems: A 12 point ROS discussed and pertinent positives are indicated in the HPI above.  All other systems are negative.  Review of Systems  Vital Signs: BP (!) 132/51 (BP Location: Left Arm, Patient Position: Sitting, Cuff Size: Large)   Pulse (!) 52   Temp 97.6 F (36.4 C) (Oral)   Resp 15   Ht '5\' 10"'$  (1.778 m)   Wt 222 lb (100.7 kg)   SpO2 95%   BMI 31.85 kg/m   Physical Exam  Mallampati Score:     Imaging: No results found.  Labs:  CBC:  Recent Labs  11/21/15 0340 12/06/15 0140 12/06/15 0412 12/07/15 0658  WBC 7.8 10.2 9.5 13.1*  HGB 9.6* 10.5* 9.9* 9.4*  HCT 31.3* 34.5* 32.8* 29.5*  PLT 280 PLATELET CLUMPS NOTED ON SMEAR, COUNT APPEARS  ADEQUATE 289 260    COAGS:  Recent Labs  01/29/16 1056 02/12/16 1131 04/19/16 1152 05/08/16 1516  INR 1.9* 1.9* 2.1* 3.9*    BMP:  Recent Labs  11/21/15 0340 12/06/15 0140 12/06/15 0412 12/07/15 0658  NA 140 142 142 140  K 4.2 4.9 4.9 4.0  CL 109 109 109 103  CO2 '23 23 25 26  '$ GLUCOSE 274* 312* 340* 327*  BUN 20 35* 35* 38*  CALCIUM 8.8* 9.6 9.5 9.7  CREATININE 1.12 1.61* 1.57* 1.34*  GFRNONAA >60 40* 41* 50*  GFRAA >60 47* 48* 58*    LIVER FUNCTION TESTS:  Recent Labs  09/30/15 0718 10/03/15 0212 10/05/15 0145 11/21/15 0340  BILITOT 0.5 0.4 0.6 0.3  AST '17 23 19 19  '$ ALT 12* 10* 11* 13*  ALKPHOS 22* 27* 24* 31*  PROT 5.3* 5.7* 4.8* 6.9  ALBUMIN 2.9* 3.0* 2.9* 3.9    TUMOR MARKERS: No results for input(s): AFPTM, CEA, CA199, CHROMGRNA in the last 8760 hours.  Assessment and Plan:  Mr Sar is a 75 year old gentleman, status post balloon angioplasty and covered stenting of stenosis of the proximal superior mesenteric artery performed 03/06/2015 for chronic mesenteric ischemia.  He returns it again today, denying any recurrent symptoms, and in fact tells me he has had difficulty with weight gain and not weight loss.  Given his risk profile we would like to see him maximized on medical therapy for vascular disease, which would include antiplatelet medication, however, I agree with Dr. [name] in that the ulceration and his history of GI bleeding should take precedent. In particular, he is at bleeding risk anyway  given his need for anticoagulation status post aortic valve replacement.  I think it would be reasonable to continue surveillance with annual appointment, and we will schedule a one-year follow-up in October 2018 with repeat mesenteric duplex exam, with nothing by mouth status the day of the exam.  I have encouraged him to continue observing all of his other scheduled appointments, including any upcoming appointments with his GI  physicians.   Electronically Signed: Corrie Mckusick 06/06/2016, 12:24 PM   I spent a total of    15 Minutes in face to face in clinical consultation, greater than 50% of which was counseling/coordinating care for chronic mesenteric ischemia, status post superior mesenteric artery covered stent placement.

## 2016-06-12 ENCOUNTER — Other Ambulatory Visit: Payer: Self-pay | Admitting: Cardiovascular Disease

## 2016-06-12 NOTE — Telephone Encounter (Signed)
Rx(s) sent to pharmacy electronically.  

## 2016-06-13 DIAGNOSIS — G4733 Obstructive sleep apnea (adult) (pediatric): Secondary | ICD-10-CM | POA: Diagnosis not present

## 2016-06-19 ENCOUNTER — Other Ambulatory Visit: Payer: Self-pay | Admitting: Cardiovascular Disease

## 2016-06-19 NOTE — Telephone Encounter (Signed)
Rx(s) sent to pharmacy electronically.  

## 2016-07-09 ENCOUNTER — Encounter: Payer: Self-pay | Admitting: Interventional Radiology

## 2016-07-10 ENCOUNTER — Telehealth: Payer: Self-pay | Admitting: Pharmacist Clinician (PhC)/ Clinical Pharmacy Specialist

## 2016-07-10 ENCOUNTER — Other Ambulatory Visit: Payer: Self-pay | Admitting: Cardiovascular Disease

## 2016-07-10 DIAGNOSIS — Z954 Presence of other heart-valve replacement: Secondary | ICD-10-CM | POA: Diagnosis not present

## 2016-07-10 DIAGNOSIS — Z7901 Long term (current) use of anticoagulants: Secondary | ICD-10-CM | POA: Diagnosis not present

## 2016-07-10 LAB — COAGUCHEK XS/INR WAIVED
INR: 3 — ABNORMAL HIGH (ref 0.9–1.1)
Prothrombin Time: 36.4 s

## 2016-07-10 MED ORDER — RAMIPRIL 5 MG PO CAPS: 5.0000 mg | ORAL_CAPSULE | Freq: Every day | ORAL | 3 refills | Status: DC

## 2016-07-10 NOTE — Telephone Encounter (Signed)
Spoke with patient, he confirmed that he takes ramipril 5 mg and 2.5 mg, one in the am, then other at hs.  Received letter from [facility] concerned about duplicate therapy.  Patient also reports that he went to lab this am for INR check, is past due.

## 2016-07-15 ENCOUNTER — Telehealth: Payer: Self-pay | Admitting: Cardiovascular Disease

## 2016-07-15 ENCOUNTER — Ambulatory Visit (INDEPENDENT_AMBULATORY_CARE_PROVIDER_SITE_OTHER): Payer: Medicare Other | Admitting: Pharmacist Clinician (PhC)/ Clinical Pharmacy Specialist

## 2016-07-15 DIAGNOSIS — Z952 Presence of prosthetic heart valve: Secondary | ICD-10-CM | POA: Diagnosis not present

## 2016-07-15 DIAGNOSIS — Z7901 Long term (current) use of anticoagulants: Secondary | ICD-10-CM | POA: Diagnosis not present

## 2016-07-15 NOTE — Telephone Encounter (Signed)
New message  Pt is returning  Please call back after 3pm

## 2016-07-16 ENCOUNTER — Other Ambulatory Visit: Payer: Self-pay | Admitting: Cardiovascular Disease

## 2016-07-16 NOTE — Telephone Encounter (Signed)
LMOM, no change to warfarin, repeat INR Dec 18

## 2016-07-16 NOTE — Telephone Encounter (Signed)
Returned call. He doesn't recall placing phone call to [place]. Thinks that Erasmo Downer may have called last week.  Patient wanted toask if anything needed in regards to INR result. Aware I will follow up w Erasmo Downer and have her reach out to him w any recommendations.

## 2016-08-05 ENCOUNTER — Other Ambulatory Visit: Payer: Self-pay | Admitting: Cardiovascular Disease

## 2016-08-05 DIAGNOSIS — Z954 Presence of other heart-valve replacement: Secondary | ICD-10-CM | POA: Diagnosis not present

## 2016-08-05 DIAGNOSIS — Z7901 Long term (current) use of anticoagulants: Secondary | ICD-10-CM | POA: Diagnosis not present

## 2016-08-05 LAB — COAGUCHEK XS/INR WAIVED
INR: 3.1 — AB (ref 0.9–1.1)
PROTHROMBIN TIME: 37 s

## 2016-08-05 LAB — PROTIME-INR: INR: 3.1 — AB (ref <=1.1)

## 2016-08-06 ENCOUNTER — Ambulatory Visit (INDEPENDENT_AMBULATORY_CARE_PROVIDER_SITE_OTHER): Payer: Medicare Other | Admitting: Pharmacist Clinician (PhC)/ Clinical Pharmacy Specialist

## 2016-08-06 DIAGNOSIS — Z7901 Long term (current) use of anticoagulants: Secondary | ICD-10-CM

## 2016-08-06 DIAGNOSIS — Z952 Presence of prosthetic heart valve: Secondary | ICD-10-CM

## 2016-08-07 DIAGNOSIS — Z1389 Encounter for screening for other disorder: Secondary | ICD-10-CM | POA: Diagnosis not present

## 2016-08-07 DIAGNOSIS — J449 Chronic obstructive pulmonary disease, unspecified: Secondary | ICD-10-CM | POA: Diagnosis not present

## 2016-08-07 DIAGNOSIS — J189 Pneumonia, unspecified organism: Secondary | ICD-10-CM | POA: Diagnosis not present

## 2016-08-09 ENCOUNTER — Other Ambulatory Visit: Payer: Self-pay | Admitting: Cardiovascular Disease

## 2016-08-16 ENCOUNTER — Other Ambulatory Visit: Payer: Self-pay | Admitting: Cardiovascular Disease

## 2016-08-16 NOTE — Telephone Encounter (Signed)
Rx(s) sent to pharmacy electronically.  

## 2016-08-16 NOTE — Telephone Encounter (Signed)
Rx has been sent to the pharmacy electronically. ° °

## 2016-09-09 ENCOUNTER — Other Ambulatory Visit: Payer: Self-pay | Admitting: Cardiovascular Disease

## 2016-09-09 DIAGNOSIS — Z7901 Long term (current) use of anticoagulants: Secondary | ICD-10-CM | POA: Diagnosis not present

## 2016-09-09 DIAGNOSIS — Z954 Presence of other heart-valve replacement: Secondary | ICD-10-CM | POA: Diagnosis not present

## 2016-09-09 LAB — COAGUCHEK XS/INR WAIVED
INR: 3.4 — AB (ref 0.9–1.1)
PROTHROMBIN TIME: 41 s

## 2016-09-09 LAB — PROTIME-INR: INR: 3.4 — AB (ref <=1.1)

## 2016-09-10 ENCOUNTER — Ambulatory Visit (INDEPENDENT_AMBULATORY_CARE_PROVIDER_SITE_OTHER): Payer: Medicare Other | Admitting: Pharmacist

## 2016-09-10 DIAGNOSIS — Z7901 Long term (current) use of anticoagulants: Secondary | ICD-10-CM

## 2016-09-10 DIAGNOSIS — Z952 Presence of prosthetic heart valve: Secondary | ICD-10-CM

## 2016-09-11 DIAGNOSIS — J449 Chronic obstructive pulmonary disease, unspecified: Secondary | ICD-10-CM | POA: Diagnosis not present

## 2016-09-11 DIAGNOSIS — Z1389 Encounter for screening for other disorder: Secondary | ICD-10-CM | POA: Diagnosis not present

## 2016-09-11 DIAGNOSIS — I5032 Chronic diastolic (congestive) heart failure: Secondary | ICD-10-CM | POA: Diagnosis not present

## 2016-09-11 DIAGNOSIS — R109 Unspecified abdominal pain: Secondary | ICD-10-CM | POA: Diagnosis not present

## 2016-09-11 DIAGNOSIS — E119 Type 2 diabetes mellitus without complications: Secondary | ICD-10-CM | POA: Diagnosis not present

## 2016-09-11 DIAGNOSIS — I739 Peripheral vascular disease, unspecified: Secondary | ICD-10-CM | POA: Diagnosis not present

## 2016-09-16 ENCOUNTER — Encounter: Payer: Self-pay | Admitting: Internal Medicine

## 2016-09-18 ENCOUNTER — Encounter (HOSPITAL_COMMUNITY): Payer: Self-pay

## 2016-09-18 ENCOUNTER — Ambulatory Visit (HOSPITAL_COMMUNITY): Payer: Medicare Other | Attending: Internal Medicine

## 2016-09-18 DIAGNOSIS — R29898 Other symptoms and signs involving the musculoskeletal system: Secondary | ICD-10-CM | POA: Diagnosis not present

## 2016-09-18 NOTE — Therapy (Addendum)
[facility] [place], [place], [address] Phone: [phone]   Fax:  [phone]  Occupational Therapy Wheelchair Evaluation  Patient Details  Name: Tanner Pena MRN: [id number] Date of Birth: 06-28-1941 Referring Provider: Dr. Samara Snide  Encounter Date: 09/18/2016      OT End of Session - 09/18/16 1633    Visit Number 1   Number of Visits 1   Authorization Type 1) UHC medicare 2) Medicaid   OT Start Time 1327   OT Stop Time 1400   OT Time Calculation (min) 33 min   Activity Tolerance Patient tolerated treatment well   Behavior During Therapy [facility] for tasks assessed/performed      Past Medical History:  Diagnosis Date  . Acute blood loss anemia 02/2015 & 05/2015   Hemoglobin 5.5 on 05/31/15; status post transfusion.  . Arthritis   . CAD (coronary artery disease)    a. 05/13/14 [name] s/p overlapping DESx2 to SVG to RCA. b.  s/p CABG '90 with redo '94 & stent to RCA SVG in 2005  . Chronic back pain   . Chronic systolic heart failure ([place])   . Chronic toe ulcer ([place])    a. Left foot  . COPD (chronic obstructive pulmonary disease) ([place])   . Critical lower limb ischemia   . Dysphagia   . Essential hypertension   . GERD (gastroesophageal reflux disease)   . GI bleeding 05/31/2015   Source not identified.  . Gout   . History of kidney stones   . Hypercholesteremia   . Myocardial infarction   . Neuromuscular disorder ([place])   . Peripheral neuropathy ([place])   . Peripheral vascular disease ([place])    Lower extremity PCI/stenting  . S/P aortic valve replacement 1990   a. St. Jude  . Sleep apnea   . Type 2 diabetes mellitus ([place]) 2007    Past Surgical History:  Procedure Laterality Date  . AMPUTATION Left 07/13/2014   Procedure: Transmetatarsal Amputation;  Surgeon: [name], MD;  Location: [place];  Service: Orthopedics;  Laterality: Left;  . AMPUTATION Left 08/20/2014   Procedure: Revision Transmetatarsal  Amputation versus Below Knee Amputation;  Surgeon: [name], MD;  Location: [place];  Service: Orthopedics;  Laterality: Left;  . ANGIOPLASTY ILLIAC ARTERY    . AORTIC VALVE REPLACEMENT  1990   St. Jude  . BACK SURGERY  [phone]   2  . BIOPSY N/A 06/20/2015   Procedure: BIOPSY;  Surgeon: Danie Binder, MD;  Location: [facility];  Service: Endoscopy;  Laterality: N/A;  . CATARACT EXTRACTION     bilateral  . COLONOSCOPY  05/2011   Dr. [name]: benign rectal polyp, left sided tics, ascending colonic ulcers (path c/w ischemia)  . COLONOSCOPY WITH PROPOFOL N/A 06/01/2015   RMR: normal appearing rectal mucosa. Scattered left-sided diverticula. the remainder of the colonic mucosa appeared normal. the distal 5 cm of terminal ileal mucosa also appeared normal. retroflexion was performed.   . CORONARY ARTERY BYPASS GRAFT  1994   6 vessels  . CORONARY STENT PLACEMENT  2005   RCA vein graft A 3.0x13.0 TAXUS stent was then placed int he vessel a Viva 3.0x4.0 (perfusion balloon was made ready it was placed through the entire lenght of the stent  . ESOPHAGOGASTRODUODENOSCOPY N/A 02/09/2015   DR. Schooler: Normal EGD  . ESOPHAGOGASTRODUODENOSCOPY  05/2011   Dr. [name]: s/p esophageal dilation, antral erosions/nodularity with benign biopsies  . ESOPHAGOGASTRODUODENOSCOPY N/A 10/04/2015  Procedure: ESOPHAGOGASTRODUODENOSCOPY (EGD);  Surgeon: Doran Stabler, MD;  Location: [facility] ENDOSCOPY;  Service: Endoscopy;  Laterality: N/A;  . ESOPHAGOGASTRODUODENOSCOPY (EGD) WITH PROPOFOL N/A 06/20/2015   Procedure: ESOPHAGOGASTRODUODENOSCOPY (EGD) WITH PROPOFOL;  Surgeon: Danie Binder, MD;  [facility];  Service: Endoscopy;  Laterality: N/A;  . EYE SURGERY    . GIVENS CAPSULE STUDY N/A 06/08/2015   Procedure: GIVENS CAPSULE STUDY;  Surgeon: [name], MD;  [facility];  Service: Endoscopy;  Laterality: N/A;  0700   . IR GENERIC HISTORICAL  06/06/2016   IR RADIOLOGIST EVAL & MGMT 06/06/2016 Corrie Mckusick, DO [facility] INTERV RAD  . LEFT HEART CATHETERIZATION WITH CORONARY ANGIOGRAM N/A 05/11/2014   Procedure: LEFT HEART CATHETERIZATION WITH CORONARY ANGIOGRAM;  Surgeon: [name], MD;  Location: [facility] C[facility] LAB;  Service: Cardiovascular;  Laterality: N/A;  . LOWER EXTREMITY ANGIOGRAM N/A 02/15/2013   Procedure: LOWER EXTREMITY ANGIOGRAM;  Surgeon: [name], MD;  Location: [facility] C[facility] LAB;  Service: Cardiovascular;  Laterality: N/A;  . LOWER EXTREMITY ANGIOGRAM N/A 06/06/2014   Procedure: LOWER EXTREMITY ANGIOGRAM;  Surgeon: [name], MD;  Location: [facility] CATH LAB;  Service: Cardiovascular;  Laterality: N/A;  . [name]  06/13/2011   Procedure: Venia Minks DILATION;  Surgeon: [name], MD;  Location[facility];  Service: Endoscopy;  Laterality: N/A;  Dilated to 56.   [name] PERCUTANEOUS CORONARY STENT INTERVENTION (PCI-S) N/A 05/13/2014   Procedure: PERCUTANEOUS CORONARY STENT INTERVENTION (PCI-S);  Surgeon: [name], MD;  Location: [facility] CATH LAB;  Service: Cardiovascular;  Laterality: N/A;  . Peripheral vascular procedures lower extremities     Right external iliac  artery PTA and stenting as well as bilateral SFA intervention remotely. Repeat procedures in 2011 bilaterally  . ROTATOR CUFF REPAIR     right  . STUMP REVISION Left 09/23/2014   Procedure: Revision Left Below Knee Amputation;  Surgeon: [name], MD;  Location: [facility];  Service: Orthopedics;  Laterality: Left;  . STUMP REVISION Left 10/13/2014   Procedure: REVISION LEFT BELOW KNEE AMPUTATION STUMP;  Surgeon: [name], MD;  Location: [facility];  Service: Orthopedics;  Laterality: Left;    There were no vitals filed for this visit.      Subjective Assessment - 09/18/16 1500    Subjective  S: This chair is too small.   Patient is accompained by: Family member  daughter, Hoyle Barr OT Assessment - 09/18/16 1633      Assessment   Diagnosis Wheelchair evaluation    Referring Provider Dr. Samara Snide       Date: 09/18/16 Patient Name: Tanner Pena Address: [address] [place], [place] [address] DOB: [birth year]   To Whom It May Concern,    Koji Niehoff was seen today in this clinic for a power wheelchair evaluation. Mr. Stelle has a past medical history that includes Arthritis in bilateral hands and shoulders, CAD, Chronic back pain, Chronic systolic heart failure, COPD, Critical lower leg ischemia, HTN, Gout, MI (several), and Diabetes mellitus II and Peripheral neuropathy (hands) . Mr. Louthan's  surgical history includes: left toe amputation (2015), Left BKA (2016), Left stump revision (2015), Back surgery (8182,9937), and a Right rotator cuff repair (8-10 years ago).  Mr. Ironside lives alone in a 1-story farmhouse with a ramped entrance. His daughter, Lelan Pons reports that she stays periodically with her father when he is ill; which recently  has been very frequent. When Mr. Renault is feeling well he is able to care for himself with limitations. When he is ill, his daughter, Lelan Pons needs to provide more assistance. Typically, Lelan Pons stops by 2-3 times a week to assist with meals and household chores.  Mr. Borgwardt cares for outside animals which include a cow, chickens, and dogs. At the present, his lack of mobility is preventing him from being able to care for them.  Mr. Speas presently has a manual wheelchair which is ill fitting and does not allow for adequate space in the hips to allow for proper weight shifting needs. The left side brake is not working properly due to mechanical fall in which Mr. Strollo landed on it. Mr. Hairfield states that he spends the majority of the day in his wheelchair. A FULL PHYSICAL ASSESSMENT REVEALS THE FOLLOWING    Existing Equipment:   Mr. Fesler owns a manual wheelchair, 4 wheeled walker, and shower chair.  Transfers:  Mr. Renfrew lives alone and completes all functional transfers at [facility] I level as long as  he has something to hold and pull himself up. His daughter, Lelan Pons states that when he is ill and she is there to help him, he requires Max Assist from her to stand up to transfer.    Head and Neck: WFL  Trunk: WFL  Hip: WFL   Upper Extremities: Bilateral shoulder A/ROM is limited. Patient is only able to complete bilateral shoulder flexion to 50% range. Bilateral Shoulder flexion and abduction: 3-/5, bilateral elbow flexion: 5/5, bilateral elbow extension: 5/5. Bilateral gross grasp is functional. Mr. Michalik reports severe numbness and cramping in bilateral hands from his arthritis. When the pain is severe, Mr. Wyndham rates it at 10/10. Mr. Strahm is unable to self-propel his manual wheelchair due to his shoulder weakness/limited range of motion and his hand pain.  Lower Extremities:  Bilateral knee extension and flexion are WFL. Right ankle planter and dorsiflexion is WFL.. Right and left knee extension/flexion: 4-/5. Right ankle planter flexion: 4-/5.   Weight Shifting Ability:  Mr. Skeet is able to weight shift independently although her ability is not adequate to prevent pressure sores   Skin Integrity:  Ms. Netty Starring has a history of pressure sores at her sacral region and right heel.  Cognition:  WFL  Activity Tolerance: Poor. Mr. Salminen recently is wheelchair bound and spends the majority of his day in his wheelchair. He states that with the use of his walker he can walk approximately 50-70 feet. He is only able to ambulate short distances. On average, Mr. Zimmers may get up to walk zero to 4 times a day. Daughter reports that recently he has not been walking very much at all. Mr. Farish reports severe pain in his left leg that occurs intermittently. He is also experiencing phantom pain. He rates his pain as a 10/10.   [employer] Item # Description   [id number] Pride [id number] ES 2S-C Jazzy 600 ES 2S-C Powerchair  [phone] Pride [id number] Height Adjustable Arm Right  E0973  Pride [id number] Height Adjustable Arm Left  E2361 Pride [id number] AGM NF-22 Battery (x2)  E1028 Pride [id number] [place] Joystick  [phone]: Terrilee Files 600 ES Power Wheelchair Patient has physical and cognitive capabilities to use the recommended power mobility device. Patient's home provides adequate access between rooms, maneuvering space, and surfaces for the operation of a power chair. This chair is more maneuverable than comparably priced traditional power wheelchairs due to the drive wheels being directly at  the center of gravity of the user. This maneuverability is required to enable the patient to negotiate the tight turns, small spaces, and obstacles characteristic of the home environment. The patient is unable to self-propel a manual wheelchair, or operate the steering tiller of a scooter secondary to UE weakness and neuropathy. The patient is unable to safely transfer onto the platform of a scooter, due to left BKA. N6295: Height Adjustable Armrests  Height adjustable Full length armrests are necessary to support the shoulder joint and surrounding structures. Lack of arm support can result in chronic stretch and damage of the joint capsule, ligaments, nerves, and blood vessels. The patient requires the use of height adjustable armrests to achieve proper armrests height for her shoulders to be adequately supported. M8413: [place] The swing away joystick mount allows for the joystick to be moved during transfers, as well as allowing the patient to navigate closer to tables and other surfaces to prepare meals. K4401: UU72 Batteries Batteries are required to power the wheelchair for use GOALS/OBJECTIVE OF SEATING INTERVENTION:  Recommendation: Mr. Winsett would benefit from a power wheelchair for use in his home and in the community. Mr. Guadamuz current manual wheelchair does not provide the appropriate amount of support that he requires due to his physical impairments and he is  not able to functionally self-propel himself due to decreased strength and limited mobility.  A scooter would not be beneficial for Mr. Chaisson. He does not have the adequate space needed in his home for the wide turning radius required by the scooter. A scooter would not be safe outside when he cares for his animals due to uneven ground.  Mr. Prater is motivated and willing to use his power wheelchair and is physically and mentally able to operate a power wheelchair. A power wheelchair will be the best mobility device for Mr. Littlejohn. A power wheelchair would increase Mr. Scheerer safety and ability to be more mobile during daily activities and his quality of life.     If you require any further information concerning Mr. Honsinger's positioning, independence or mobility needs; or any further information why a lesser device will not work, please do not hesitate to contact me at [place] [place] [place], [place] [address] [phone].     Thank you for this referral,   ____________________        Ailene Ravel OTR/L, CBIS         Patient will benefit from skilled therapeutic intervention in order to improve the following deficits and impairments:     Visit Diagnosis: Other symptoms and signs involving the musculoskeletal system      G-Codes - 2016-10-12 1638    Functional Assessment Tool Used clinical judgement   Functional Limitation Mobility: Walking and moving around   Mobility: Walking and Moving Around Current Status [phone]) At least 80 percent but less than 100 percent impaired, limited or restricted   Mobility: Walking and Moving Around Goal Status [phone]) At least 80 percent but less than 100 percent impaired, limited or restricted   Mobility: Walking and Moving Around Discharge Status [phone]) At least 80 percent but less than 100 percent impaired, limited or restricted      Problem List Patient Active Problem List    Diagnosis Date Noted  . COPD exacerbation ([place]) 12/06/2015  . Acute on chronic systolic congestive heart failure ([place])   . Frequent PVCs 10/05/2015  . Cardiomyopathy, ischemic 10/05/2015  . AVM (  arteriovenous malformation)   . Acute gastric ulcer   . S/P AVR (aortic valve replacement)   . Controlled type 2 diabetes mellitus without complication ([place])   . Chronic gastric ulcer   . Gastric AVM   . Coronary artery disease involving native coronary artery of native heart without angina pectoris   . AKI (acute kidney injury) ([place]) 10/01/2015  . UGIB (upper gastrointestinal bleed) 09/30/2015  . Chest pain 07/29/2015  . CAD in native artery 07/29/2015  . Gastritis and gastroduodenitis   . Melena 06/20/2015  . GIB (gastrointestinal bleeding) 06/20/2015  . Absolute anemia   . Bleeding gastrointestinal   . History of coronary artery stent placement   . Anticoagulation management encounter   . GI bleed 06/14/2015  . Essential hypertension 06/14/2015  . Iron deficiency anemia due to chronic blood loss   . Diverticulosis 06/01/2015  . Anemia, iron deficiency   . Chronic obstructive pulmonary disease ([place])   . Type 2 diabetes mellitus with stage 1 chronic kidney disease, without long-term current use of insulin ([place])   . GI bleeding 05/31/2015  . 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 Acute kidney injury ([place]) 05/30/2015  . Coronary artery disease involving native coronary artery of native heart with angina pectoris ([place])   . 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 Chronic systolic heart failure ([place]) 03/05/2015  . Mesenteric ischemia, chronic ([place])   . Acute blood loss anemia 03/02/2015  . Elevated INR 03/02/2015  . Chronic mesenteric ischemia ([place])   . IDA (iron deficiency anemia)   . Chronic anticoagulation   . Enteritis 03/01/2015  . Other noninfectious gastroenteritis   . Abnormal CT of the abdomen 02/17/2015  . Abdominal pain   . Acute respiratory failure with hypoxia  ([place])   . Iron deficiency anemia   . Systolic CHF, acute on chronic (HCC)   . NSTEMI (non-ST elevated myocardial infarction) ([place])   . CHF exacerbation ([place]) 02/04/2015  . SOB (shortness of breath)   . Acute respiratory failure ([place]) 12/28/2014  . CAP (community acquired pneumonia) 12/28/2014  . COPD with exacerbation ([place]) 12/28/2014  . Dehiscence of operative wound post left below knee amputation 10/13/2014  . S/P below knee amputation ([place]) 10/13/2014  . Diabetic peripheral angiopathy ([place]) 09/04/2014  . Below knee amputation status ([place]) 08/20/2014  . S/P transmetatarsal amputation of foot ([place]) 07/13/2014  . Chronic toe ulcer ([place])   . Warfarin-induced coagulopathy ([place]) 05/08/2014  . HTN (hypertension) 05/08/2014  . PVC's (premature ventricular contractions) 05/08/2014  . Unstable angina ([place]) 05/07/2014  . First degree heart block 11/27/2013  . Acute on chronic combined systolic and diastolic CHF 32/35/5732  . COPD (chronic obstructive pulmonary disease) ([place]) 08/11/2013  . LBBB (left bundle branch block) 08/11/2013  . Critical lower limb ischemia 03/04/2013  . CAD - CABG '90 with re do '94  01/05/2013  . Tobacco abuse 01/05/2013  . PVD prior SFA PTA with chronic LE disease, not amenable to PTA 01/05/2013  . DM2 (diabetes mellitus, type 2) ([place]) 01/05/2013  . Accelerated hypertension on admission 01/05/2013  . Carotid stenosis 01/05/2013  . Hyperlipidemia with target LDL less than 70 01/05/2013  . Long term current use of anticoagulant therapy 11/11/2012  . H/O aortic valve replacement- St Jude 11/11/2012  . Colon cancer screening 05/02/2011  . Esophageal dysphagia 05/01/2011  . CLOSED FRACTURE OF METATARSAL BONE 09/17/2010   Ailene Ravel, OTR/L,CBIS  [phone]  09/18/2016, 4:39 PM  [facility]  [address] [place], [place], [address] Phone: [phone]   Fax:  [phone]  Name: DAOUDA LONZO MRN:  [id number] Date of Birth: 05-15-1941

## 2016-09-27 ENCOUNTER — Ambulatory Visit (INDEPENDENT_AMBULATORY_CARE_PROVIDER_SITE_OTHER): Payer: Medicare Other | Admitting: Internal Medicine

## 2016-09-27 ENCOUNTER — Encounter: Payer: Self-pay | Admitting: Internal Medicine

## 2016-09-27 VITALS — BP 109/45 | HR 56 | Temp 97.8°F | Ht 70.5 in | Wt 228.0 lb

## 2016-09-27 DIAGNOSIS — I771 Stricture of artery: Secondary | ICD-10-CM

## 2016-09-27 DIAGNOSIS — K551 Chronic vascular disorders of intestine: Secondary | ICD-10-CM

## 2016-09-27 DIAGNOSIS — K219 Gastro-esophageal reflux disease without esophagitis: Secondary | ICD-10-CM | POA: Diagnosis not present

## 2016-09-27 NOTE — Progress Notes (Signed)
Primary Care Physician:  [name], MD Primary Gastroenterologist:  Dr. [name]  Pre-Procedure History & Physical: HPI:  Tanner Pena is a 76 y.o. male here for evaluation of nausea and GERD. He has not been seen in our office for a good 1 1/2 years.  Patient reports vague symptoms of nausea without vomiting for about 2 months. Her history of diabetes. History of bleeding -  AVMs peptic ulcer disease treating in [place] last year. Does describe some vague odynophagia/dysphagia. No mention of any structural lesions in the esophagus at EGD last year. Along the way, he has come off his PPI for reasons which are not clear. He has diffuse atherosclerotic coronary and peripheral vascular disease. 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 He has multiple stents. He denies post-prandial abdominal pain.  Pt denies melena, hematochezia or hematemesis since his hospitalization in [place] last year.   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  Past Medical History:  Diagnosis Date  . Acute blood loss anemia 02/2015 & 05/2015   Hemoglobin 5.5 on 05/31/15; status post transfusion.  . Arthritis   . CAD (coronary artery disease)    a. 05/13/14 [name] s/p overlapping DESx2 to SVG to RCA. b.  s/p CABG '90 with redo '94 & stent to RCA SVG in 2005  . Chronic back pain   . Chronic systolic heart failure ([place])   . Chronic toe ulcer ([place])    a. Left foot  . COPD (chronic obstructive pulmonary disease) ([place])   . Critical lower limb ischemia   . Dysphagia   . Essential hypertension   . GERD (gastroesophageal reflux disease)   . GI bleeding 05/31/2015   Source not identified.  . Gout   . History of kidney stones   . Hypercholesteremia   . Myocardial infarction   . Neuromuscular disorder ([place])   . Peripheral neuropathy ([place])   . Peripheral vascular disease ([place])    Lower extremity PCI/stenting  . S/P aortic valve  replacement 1990   a. St. Jude  . Sleep apnea   . Type 2 diabetes mellitus ([place]) 2007    Past Surgical History:  Procedure Laterality Date  . AMPUTATION Left 07/13/2014   Procedure: Transmetatarsal Amputation;  Surgeon: [name], MD;  Location: [place];  Service: Orthopedics;  Laterality: Left;  . AMPUTATION Left 08/20/2014   Procedure: Revision Transmetatarsal Amputation versus Below Knee Amputation;  Surgeon: [name], MD;  Location: [place];  Service: Orthopedics;  Laterality: Left;  . ANGIOPLASTY ILLIAC ARTERY    . AORTIC VALVE REPLACEMENT  1990   St. Jude  . BACK SURGERY  [phone]   2  . BIOPSY N/A 06/20/2015   Procedure: BIOPSY;  Surgeon: Danie Binder, MD;  [facility];  Service: Endoscopy;  Laterality: N/A;  . CATARACT EXTRACTION     bilateral  . COLONOSCOPY  05/2011   Dr. [name]: benign rectal polyp, left sided tics, ascending colonic ulcers (path c/w ischemia)  . COLONOSCOPY WITH PROPOFOL N/A 06/01/2015   RMR: normal appearing rectal mucosa. Scattered left-sided diverticula. the remainder of the colonic mucosa appeared normal. the distal 5 cm of terminal ileal mucosa also appeared normal. retroflexion was performed.   . CORONARY ARTERY BYPASS GRAFT  1994   6 vessels  . CORONARY STENT PLACEMENT  2005   RCA vein graft A 3.0x13.0 TAXUS stent was then placed int he vessel a Viva 3.0x4.0 (perfusion balloon  was made ready it was placed through the entire lenght of the stent  . ESOPHAGOGASTRODUODENOSCOPY N/A 02/09/2015   DR. Schooler: Normal EGD  . ESOPHAGOGASTRODUODENOSCOPY  05/2011   Dr. [name]: s/p esophageal dilation, antral erosions/nodularity with benign biopsies  . ESOPHAGOGASTRODUODENOSCOPY N/A 10/04/2015   Procedure: ESOPHAGOGASTRODUODENOSCOPY (EGD);  Surgeon: Doran Stabler, MD;  Location: [facility] ENDOSCOPY;  Service: Endoscopy;  Laterality: N/A;  . ESOPHAGOGASTRODUODENOSCOPY (EGD) WITH PROPOFOL N/A 06/20/2015   Procedure: ESOPHAGOGASTRODUODENOSCOPY (EGD) WITH  PROPOFOL;  Surgeon: Danie Binder, MD;  [facility];  Service: Endoscopy;  Laterality: N/A;  . EYE SURGERY    . GIVENS CAPSULE STUDY N/A 06/08/2015   Procedure: GIVENS CAPSULE STUDY;  Surgeon: [name], MD;  [facility];  Service: Endoscopy;  Laterality: N/A;  0700   . IR GENERIC HISTORICAL  06/06/2016   IR RADIOLOGIST EVAL & MGMT 06/06/2016 Corrie Mckusick, DO [facility] INTERV RAD  . LEFT HEART CATHETERIZATION WITH CORONARY ANGIOGRAM N/A 05/11/2014   Procedure: LEFT HEART CATHETERIZATION WITH CORONARY ANGIOGRAM;  Surgeon: [name], MD;  Location: [facility] CATH LAB;  Service: Cardiovascular;  Laterality: N/A;  . LOWER EXTREMITY ANGIOGRAM N/A 02/15/2013   Procedure: LOWER EXTREMITY ANGIOGRAM;  Surgeon: [name], MD;  Location: [facility] CATH LAB;  Service: Cardiovascular;  Laterality: N/A;  . LOWER EXTREMITY ANGIOGRAM N/A 06/06/2014   Procedure: LOWER EXTREMITY ANGIOGRAM;  Surgeon: [name], MD;  Location: [facility] CATH LAB;  Service: Cardiovascular;  Laterality: N/A;  . [name]  06/13/2011   Procedure: Venia Minks DILATION;  Surgeon: [name], MD;  [facility];  Service: Endoscopy;  Laterality: N/A;  Dilated to 56.   [name] PERCUTANEOUS CORONARY STENT INTERVENTION (PCI-S) N/A 05/13/2014   Procedure: PERCUTANEOUS CORONARY STENT INTERVENTION (PCI-S);  Surgeon: [name], MD;  Location: [facility] CATH LAB;  Service: Cardiovascular;  Laterality: N/A;  . Peripheral vascular procedures lower extremities     Right external iliac  artery PTA and stenting as well as bilateral SFA intervention remotely. Repeat procedures in 2011 bilaterally  . ROTATOR CUFF REPAIR     right  . STUMP REVISION Left 09/23/2014   Procedure: Revision Left Below Knee Amputation;  Surgeon: [name], MD;  Location: [place];  Service: Orthopedics;  Laterality: Left;  . STUMP REVISION Left 10/13/2014   Procedure: REVISION LEFT BELOW KNEE AMPUTATION STUMP;  Surgeon: [name],  MD;  Location: [facility];  Service: Orthopedics;  Laterality: Left;    Prior to Admission medications   Medication Sig Start Date End Date Taking? Authorizing Provider  albuterol (PROVENTIL) 4 MG tablet Take 4 mg by mouth 3 (three) times daily. 01/20/15  Yes Historical Provider, MD  albuterol-ipratropium (COMBIVENT) 18-103 MCG/ACT inhaler Inhale 1 puff into the lungs 4 (four) times daily. Coughing/ Shortness of Breath   Yes Historical Provider, MD  allopurinol (ZYLOPRIM) 300 MG tablet Take 300 mg by mouth daily.   Yes Historical Provider, MD  cholecalciferol (VITAMIN D) 1000 UNITS tablet Take 2,000 Units by mouth daily.   Yes Historical Provider, MD  fenofibrate (TRICOR) 145 MG tablet TAKE 1 TABLET BY MOUTH ONCE DAILY FOR CHOLESTEROL. 08/09/16  Yes Troy Sine, MD  fish oil-omega-3 fatty acids 1000 MG capsule Take 1 capsule (1 g total) by mouth 2 (two) times daily. 01/22/13  Yes Kristin L Alvstad, RPH-CPP  furosemide (LASIX) 80 MG tablet Take 1 tablet (80 mg total) by mouth daily. 12/07/15  Yes Kathie Dike, MD  glimepiride (AMARYL)  1 MG tablet Take 1 mg by mouth 2 (two) times daily.   Yes Historical Provider, MD  Insulin Detemir (LEVEMIR) 100 UNIT/ML Pen Inject 20 Units into the skin daily. Patient taking differently: Inject into the skin daily.  12/07/15  Yes Kathie Dike, MD  isosorbide mononitrate (IMDUR) 60 MG 24 hr tablet TAKE ONE TABLET BY MOUTH ONCE DAILY. 06/12/16  Yes Troy Sine, MD  metoprolol succinate (TOPROL-XL) 25 MG 24 hr tablet TAKE ONE TABLET BY MOUTH IN THE MORNING AND 1/2 TABLET IN THE EVENING. 06/12/16  Yes Troy Sine, MD  NITROSTAT 0.4 MG SL tablet PLACE 1 TAB UNDER TONGUE EVERY 5 MIN IF NEEDED FOR CHEST PAIN. MAY USE 3 TIMES.NO RELIEF CALL 911. 07/20/15  Yes Eileen Stanford, PA-C  oxyCODONE-acetaminophen (PERCOCET) 10-325 MG per tablet Take 1 tablet by mouth every 4 (four) hours as needed for pain. Patient taking differently: Take 1 tablet by mouth every 3 (three)  hours as needed for pain.  09/23/14  Yes [name], MD  potassium chloride SA (K-DUR,KLOR-CON) 20 MEQ tablet Take 20 mEq by mouth daily.     Yes Historical Provider, MD  pregabalin (LYRICA) 50 MG capsule Take one capsule by mouth twice daily for pains Patient taking differently: Take 50 mg by mouth 2 (two) times daily.  08/23/14  Yes Mahima Pandey, MD  ramipril (ALTACE) 2.5 MG capsule Take 1 capsule (2.5 mg total) by mouth at bedtime. 08/16/16  Yes Troy Sine, MD  ramipril (ALTACE) 5 MG capsule Take 1 capsule (5 mg total) by mouth daily. Takes 5 mg each am and 2.5 mg each pm 07/10/16 10/08/16 Yes [name], MD  sitaGLIPtin (JANUVIA) 50 MG tablet Take 50 mg by mouth daily.   Yes Historical Provider, MD  vitamin C (ASCORBIC ACID) 500 MG tablet Take 500 mg by mouth daily.   Yes Historical Provider, MD  warfarin (COUMADIN) 5 MG tablet Take 1 tablet (5 mg total) by mouth daily at 6 PM. Patient taking differently: Take 5 mg by mouth daily at 6 PM. Take 5 mg on Monday and Friday and 2.5 mg all other days. 10/14/15  Yes Florencia Reasons, MD  ferrous sulfate 325 (65 FE) MG EC tablet Take 1 tablet (325 mg total) by mouth daily with breakfast. 06/02/15   Rexene Alberts, MD  Insulin Pen Needle 29G X 12.7MM MISC Use as directed 12/07/15   Kathie Dike, MD    Allergies as of 09/27/2016  . (No Known Allergies)    Family History  Problem Relation Age of Onset  . Diabetes Father   . Heart failure Other   . Colon cancer Neg Hx   . Liver disease Neg Hx     Social History   Social History  . Marital status: Legally Separated    Spouse name: N/A  . Number of children: 5  . Years of education: N/A   Occupational History  . 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 Smoking status: Former Smoker    Packs/day: 1.00    Years: 55.00    Types: Cigarettes    Start date: 08/19/1953    Quit date: 05/07/2014  . Smokeless tobacco: Current User    Types: Chew    Last attempt to  quit: 08/20/1995     Comment: Has quit on 3 occasions. Counseling given today 5-10 minutes   I am more than likely going to quit "  . Alcohol  use No     Comment: Socially, sometimes 12 ounce beer daily, may go month without/no whiskey  . Drug use: No  . Sexual activity: Not Currently   Other Topics Concern  . Not on file   Social History Narrative   Has 3 daughters   Has 2 sons    Review of Systems: See HPI, otherwise negative ROS  Physical Exam: BP (!) 109/45   Pulse (!) 56   Temp 97.8 F (36.6 C) (Oral)   Ht 5' 10.5" (1.791 m)   Wt 228 lb (103.4 kg)   BMI 32.25 kg/m  General:   Alert,  pleasant and cooperative in NAD;  accompanied by his daughter Neck:  Supple; no masses or thyromegaly. No significant cervical adenopathy. Lungs:  Clear throughout to auscultation.   No wheezes, crackles, or rhonchi. No acute distress.   Heart:  Regular rate and rhythm; prosthetic hearts sounds; clicks, rubs,  or gallops. Abdomen: Non-distended, normal bowel sounds.  No bruits.  Soft and nontender without appreciable mass or hepatosplenomegaly.  Pulses:  Normal pulses noted. Extremities: prosthetic LLE; BKA   Impression:  Pleasant 76 year old gentleman multiple comorbidities being evaluated for several week history of vague symptoms of nausea without vomiting. Some reflux symptoms as well. He denies out and out abdominal pain. He has lost a little weight. Symptoms are not typical of recurrent  mesenteric ischemia all though he has diffuse atherosclerotic disease - status post stenting of SMA stenosis with the follow-up balloon dilation..  Long-standing GERD and a history of bleeding peptic ulcer disease. No longer on a PPI. With diabetes, he could have an element of gastroparesis. Vague dysphagia symptoms mayy or may not need furtherevaluation.  Certainly, he does not look toxic toxic or acutely ill at this time. He needs to be on a PPI.  Recommendations:   GERD information provided  2 week  supply of Dexilant 60 mg -  Take one daily before breakfast  Pt is to call in 2 weeks and let us know how you he is doing and we will go from there.  Pt understands that unless he is much improve with acid suppression, then further investigation will be needed.  Further recommendations to follow.           Notice: This dictation was prepared with Dragon dictation along with smaller phrase technology. Any transcriptional errors that result from this process are unintentional and may not be corrected upon review.

## 2016-09-27 NOTE — Patient Instructions (Signed)
GERD information provided  2 week supply of Dexilant 60 mg -  Take one daily before breakfast  Call in 2 weeks and let us know how you are doing and we will go from there

## 2016-09-30 ENCOUNTER — Encounter: Payer: Self-pay | Admitting: Internal Medicine

## 2016-10-14 ENCOUNTER — Other Ambulatory Visit: Payer: Self-pay | Admitting: Cardiovascular Disease

## 2016-10-15 NOTE — Telephone Encounter (Signed)
Rx(s) sent to pharmacy electronically.  

## 2016-10-22 ENCOUNTER — Telehealth: Payer: Self-pay

## 2016-10-22 NOTE — Telephone Encounter (Signed)
pts daughter- Lelan Pons- called- she said pt has done well on dexilant and they would like an rx sent in to the pharmacy.   Routing to AB in RMR absence.

## 2016-10-23 MED ORDER — DEXLANSOPRAZOLE 60 MG PO CPDR: 60.0000 mg | DELAYED_RELEASE_CAPSULE | Freq: Every day | ORAL | 3 refills | Status: AC

## 2016-10-23 NOTE — Telephone Encounter (Signed)
Done

## 2016-11-11 ENCOUNTER — Ambulatory Visit: Payer: Medicare Other | Admitting: Gastroenterology

## 2016-11-13 ENCOUNTER — Other Ambulatory Visit: Payer: Self-pay | Admitting: Cardiovascular Disease

## 2016-11-13 ENCOUNTER — Ambulatory Visit (INDEPENDENT_AMBULATORY_CARE_PROVIDER_SITE_OTHER): Payer: Medicare Other | Admitting: Pharmacist

## 2016-11-13 DIAGNOSIS — Z7901 Long term (current) use of anticoagulants: Secondary | ICD-10-CM | POA: Diagnosis not present

## 2016-11-13 DIAGNOSIS — Z952 Presence of prosthetic heart valve: Secondary | ICD-10-CM

## 2016-11-13 DIAGNOSIS — Z954 Presence of other heart-valve replacement: Secondary | ICD-10-CM | POA: Diagnosis not present

## 2016-11-13 LAB — COAGUCHEK XS/INR WAIVED
INR: 4.6 — AB (ref 0.9–1.1)
Prothrombin Time: 54.9 s

## 2016-11-13 LAB — PROTIME-INR: INR: 4.6 — AB (ref <=1.1)

## 2016-11-18 ENCOUNTER — Other Ambulatory Visit: Payer: Self-pay | Admitting: Cardiovascular Disease

## 2016-11-18 ENCOUNTER — Encounter: Payer: Self-pay | Admitting: Pharmacist Clinician (PhC)/ Clinical Pharmacy Specialist

## 2016-11-18 DIAGNOSIS — Z952 Presence of prosthetic heart valve: Secondary | ICD-10-CM

## 2016-11-18 DIAGNOSIS — Z7901 Long term (current) use of anticoagulants: Secondary | ICD-10-CM

## 2016-11-18 NOTE — Progress Notes (Signed)
This encounter was created in error - please disregard.

## 2016-11-18 NOTE — Telephone Encounter (Signed)
Rx request sent to pharmacy.  

## 2016-11-21 DIAGNOSIS — I5022 Chronic systolic (congestive) heart failure: Secondary | ICD-10-CM | POA: Diagnosis not present

## 2016-11-21 DIAGNOSIS — K551 Chronic vascular disorders of intestine: Secondary | ICD-10-CM | POA: Diagnosis not present

## 2016-11-21 DIAGNOSIS — L84 Corns and callosities: Secondary | ICD-10-CM | POA: Diagnosis not present

## 2016-11-21 DIAGNOSIS — E1140 Type 2 diabetes mellitus with diabetic neuropathy, unspecified: Secondary | ICD-10-CM | POA: Diagnosis not present

## 2016-11-21 DIAGNOSIS — Z89512 Acquired absence of left leg below knee: Secondary | ICD-10-CM | POA: Diagnosis not present

## 2016-11-28 ENCOUNTER — Ambulatory Visit (INDEPENDENT_AMBULATORY_CARE_PROVIDER_SITE_OTHER): Payer: Medicare Other | Admitting: Gastroenterology

## 2016-11-28 ENCOUNTER — Encounter: Payer: Self-pay | Admitting: Gastroenterology

## 2016-11-28 DIAGNOSIS — K219 Gastro-esophageal reflux disease without esophagitis: Secondary | ICD-10-CM | POA: Insufficient documentation

## 2016-11-28 NOTE — Progress Notes (Signed)
Referring Provider: Redmond School, MD Primary Care Physician:  [name], MD Primary GI: Dr. [name]   Chief Complaint  Patient presents with  . Abdominal Pain    f/u, pain sometimes  . Gastroesophageal Reflux    Dexilant helping    HPI:   Tanner Pena is a 76 y.o. male presenting today with a history of chronic mesenteric ischemia s/p covered stenting/balloon angioplasty of SMA stenosis in July 2016. Last seen by IR in Oct 2017. Last diagnostic study for patency of stent was in April 2017. He will have repeat mesenteric duplex exam in October 2018 with follow-up. History of GI bleeding secondary to AVMS and gastric ulcers. This has been stable   Last seen in February with long-standing GERD and vague nausea, dysphagia. Started on a PPI. Returning today with significant improvement in symptoms since starting [place]. His appetite is good. Rare pain if at all. No fear of eating. No weight loss. Denies N/V. Good BMs. No overt signs of GI bleeding.   Past Medical History:  Diagnosis Date  . Acute blood loss anemia 02/2015 & 05/2015   Hemoglobin 5.5 on 05/31/15; status post transfusion.  . Arthritis   . CAD (coronary artery disease)    a. 05/13/14 [name] s/p overlapping DESx2 to SVG to RCA. b.  s/p CABG '90 with redo '94 & stent to RCA SVG in 2005  . Chronic back pain   . Chronic systolic heart failure ([place])   . Chronic toe ulcer ([place])    a. Left foot  . COPD (chronic obstructive pulmonary disease) ([place])   . Critical lower limb ischemia   . Dysphagia   . Essential hypertension   . GERD (gastroesophageal reflux disease)   . GI bleeding 05/31/2015   Source not identified.  . Gout   . History of kidney stones   . Hypercholesteremia   . Myocardial infarction   . Neuromuscular disorder ([place])   . Peripheral neuropathy ([place])   . Peripheral vascular disease ([place])    Lower extremity PCI/stenting  . S/P aortic valve replacement 1990   a. St. Jude  . Sleep apnea   . Type  2 diabetes mellitus ([place]) 2007    Past Surgical History:  Procedure Laterality Date  . AMPUTATION Left 07/13/2014   Procedure: Transmetatarsal Amputation;  Surgeon: [name], MD;  Location: [place];  Service: Orthopedics;  Laterality: Left;  . AMPUTATION Left 08/20/2014   Procedure: Revision Transmetatarsal Amputation versus Below Knee Amputation;  Surgeon: [name], MD;  Location: [place];  Service: Orthopedics;  Laterality: Left;  . ANGIOPLASTY ILLIAC ARTERY    . AORTIC VALVE REPLACEMENT  1990   St. Jude  . BACK SURGERY  [phone]   2  . BIOPSY N/A 06/20/2015   Procedure: BIOPSY;  Surgeon: Danie Binder, MD;  [facility];  Service: Endoscopy;  Laterality: N/A;  . CATARACT EXTRACTION     bilateral  . COLONOSCOPY  05/2011   Dr. [name]: benign rectal polyp, left sided tics, ascending colonic ulcers (path c/w ischemia)  . COLONOSCOPY WITH PROPOFOL N/A 06/01/2015   RMR: normal appearing rectal mucosa. Scattered left-sided diverticula. the remainder of the colonic mucosa appeared normal. the distal 5 cm of terminal ileal mucosa also appeared normal. retroflexion was performed.   . CORONARY ARTERY BYPASS GRAFT  1994   6 vessels  . CORONARY STENT PLACEMENT  2005   RCA vein graft A 3.0x13.0 TAXUS stent was then placed int he  vessel a Viva 3.0x4.0 (perfusion balloon was made ready it was placed through the entire lenght of the stent  . ESOPHAGOGASTRODUODENOSCOPY N/A 02/09/2015   DR. Schooler: Normal EGD  . ESOPHAGOGASTRODUODENOSCOPY  05/2011   Dr. [name]: s/p esophageal dilation, antral erosions/nodularity with benign biopsies  . 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 ESOPHAGOGASTRODUODENOSCOPY (EGD) WITH PROPOFOL N/A 06/20/2015   Procedure: ESOPHAGOGASTRODUODENOSCOPY (EGD) WITH PROPOFOL;  Surgeon: Danie Binder, MD;  [facility];  Service: Endoscopy;   Laterality: N/A;  . EYE SURGERY    . GIVENS CAPSULE STUDY N/A 06/08/2015   Procedure: GIVENS CAPSULE STUDY;  Surgeon: [name], MD;  [facility];  Service: Endoscopy;  Laterality: N/A;  0700   . IR GENERIC HISTORICAL  06/06/2016   IR RADIOLOGIST EVAL & MGMT 06/06/2016 Corrie Mckusick, DO [facility] INTERV RAD  . LEFT HEART CATHETERIZATION WITH CORONARY ANGIOGRAM N/A 05/11/2014   Procedure: LEFT HEART CATHETERIZATION WITH CORONARY ANGIOGRAM;  Surgeon: [name], MD;  Location: [facility] CATH LAB;  Service: Cardiovascular;  Laterality: N/A;  . LOWER EXTREMITY ANGIOGRAM N/A 02/15/2013   Procedure: LOWER EXTREMITY ANGIOGRAM;  Surgeon: [name], MD;  Location: [facility] CATH LAB;  Service: Cardiovascular;  Laterality: N/A;  . LOWER EXTREMITY ANGIOGRAM N/A 06/06/2014   Procedure: LOWER EXTREMITY ANGIOGRAM;  Surgeon: [name], MD;  Location: [facility] CATH LAB;  Service: Cardiovascular;  Laterality: N/A;  . [name]  06/13/2011   Procedure: Venia Minks DILATION;  Surgeon: [name], MD;  [facility];  Service: Endoscopy;  Laterality: N/A;  Dilated to 56.   [name] PERCUTANEOUS CORONARY STENT INTERVENTION (PCI-S) N/A 05/13/2014   Procedure: PERCUTANEOUS CORONARY STENT INTERVENTION (PCI-S);  Surgeon: [name], MD;  Location: [facility] CATH LAB;  Service: Cardiovascular;  Laterality: N/A;  . Peripheral vascular procedures lower extremities     Right external iliac  artery PTA and stenting as well as bilateral SFA intervention remotely. Repeat procedures in 2011 bilaterally  . ROTATOR CUFF REPAIR     right  . STUMP REVISION Left 09/23/2014   Procedure: Revision Left Below Knee Amputation;  Surgeon: [name], MD;  Location: [place];  Service: Orthopedics;  Laterality: Left;  . STUMP REVISION Left 10/13/2014   Procedure: REVISION LEFT BELOW KNEE AMPUTATION STUMP;  Surgeon: [name], MD;  [facility];  Service: Orthopedics;  Laterality: Left;    Current  Outpatient Prescriptions  Medication Sig Dispense Refill  . albuterol (PROVENTIL) 4 MG tablet Take 4 mg by mouth 3 (three) times daily.    [name] albuterol-ipratropium (COMBIVENT) 18-103 MCG/ACT inhaler Inhale 1 puff into the lungs as needed. Coughing/ Shortness of Breath    . allopurinol (ZYLOPRIM) 300 MG tablet Take 300 mg by mouth daily.    . cholecalciferol (VITAMIN D) 1000 UNITS tablet Take 2,000 Units by mouth daily.    [name] dexlansoprazole (DEXILANT) 60 MG capsule Take 1 capsule (60 mg total) by mouth daily. 90 capsule 3  . fenofibrate (TRICOR) 145 MG tablet TAKE 1 TABLET BY MOUTH ONCE DAILY FOR CHOLESTEROL. 30 tablet 3  . ferrous sulfate 325 (65 FE) MG EC tablet Take 1 tablet (325 mg total) by mouth daily with breakfast.    . fish oil-omega-3 fatty acids 1000 MG capsule Take 1 capsule (1 g total) by mouth 2 (two) times daily.    [name]  furosemide (LASIX) 80 MG tablet Take 1 tablet (80 mg total) by mouth daily. 30 tablet 0  . glimepiride (AMARYL) 1 MG tablet Take 1 mg by mouth 2 (two) times daily.    . Insulin Detemir (LEVEMIR) 100 UNIT/ML Pen Inject 20 Units into the skin daily. (Patient taking differently: Inject into the skin daily. ) 15 mL 11  . Insulin Pen Needle 29G X 12.7MM MISC Use as directed 30 each 0  . isosorbide mononitrate (IMDUR) 60 MG 24 hr tablet TAKE ONE TABLET BY MOUTH ONCE DAILY. 90 tablet 3  . metoprolol succinate (TOPROL-XL) 25 MG 24 hr tablet TAKE ONE TABLET BY MOUTH IN THE MORNING AND 1/2 TABLET IN THE EVENING. 45 tablet 8  . NITROSTAT 0.4 MG SL tablet PLACE 1 TAB UNDER TONGUE EVERY 5 MIN IF NEEDED FOR CHEST PAIN. MAY USE 3 TIMES.NO RELIEF CALL 911. 25 tablet 4  . oxyCODONE-acetaminophen (PERCOCET) 10-325 MG per tablet Take 1 tablet by mouth every 4 (four) hours as needed for pain. (Patient taking differently: Take 1 tablet by mouth every 3 (three) hours as needed for pain. ) 30 tablet 0  . potassium chloride SA (K-DUR,KLOR-CON) 20 MEQ tablet Take 20 mEq by mouth daily.      .  pregabalin (LYRICA) 50 MG capsule Take one capsule by mouth twice daily for pains (Patient taking differently: Take 50 mg by mouth 3 (three) times daily. ) 60 capsule 5  . ramipril (ALTACE) 2.5 MG capsule Take 1 capsule (2.5 mg total) by mouth daily. Please schedule appointment for refills. 30 capsule 0  . sitaGLIPtin (JANUVIA) 50 MG tablet Take 50 mg by mouth daily.    . vitamin C (ASCORBIC ACID) 500 MG tablet Take 500 mg by mouth daily.    [name] warfarin (COUMADIN) 5 MG tablet Take 1 tablet (5 mg total) by mouth daily at 6 PM. (Patient taking differently: Take 5 mg by mouth daily at 6 PM. Take 5 mg on Monday and Friday and 2.5 mg all other days.) 30 tablet 0   No current facility-administered medications for this visit.     Allergies as of 11/28/2016  . (No Known Allergies)    Family History  Problem Relation Age of Onset  . Diabetes Father   . Heart failure Other   . Colon cancer Neg Hx   . Liver disease Neg Hx     Social History   Social History  . Marital status: Legally Separated    Spouse name: N/A  . Number of children: 5  . Years of education: N/A   Occupational History  . 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 Smoking status: Former Smoker    Packs/day: 1.00    Years: 55.00    Types: Cigarettes    Start date: 08/19/1953    Quit date: 05/07/2014  . Smokeless tobacco: Current User    Types: Chew    Last attempt to quit: 08/20/1995     Comment: Has quit on 3 occasions. Counseling given today 5-10 minutes   I am more than likely going to quit "  . Alcohol use No     Comment: Socially, sometimes 12 ounce beer daily, may go month without/no whiskey  . Drug use: No  . Sexual activity: Not Currently   Other Topics Concern  . None   Social History Narrative   Has 3 daughters   Has 2 sons    Review of Systems: As mentioned  in HPI   Physical Exam: BP 122/69   Pulse 63   Temp 98 F (36.7 C) (Oral)   Ht '5\' 10"'$  (1.778 m)   Wt 230 lb  6.4 oz (104.5 kg)   BMI 33.06 kg/m  General:   Alert and oriented. No distress noted. Pleasant and cooperative.  Head:  Normocephalic and atraumatic. Eyes:  Conjuctiva clear without scleral icterus. Mouth:  Oral mucosa pink and moist.  Abdomen:  +BS, soft, non-tender and non-distended. No rebound or guarding. No HSM or masses noted. Msk:  Left BKA with prosthesis  Extremities:  Without edema. Neurologic:  Alert and  oriented x4;  grossly normal neurologically. Psych:  Alert and cooperative. Normal mood and affect.

## 2016-11-28 NOTE — Progress Notes (Signed)
cc'ed to pcp °

## 2016-11-28 NOTE — Patient Instructions (Signed)
Continue taking Dexilant daily.   We will see you in 6 months! I am glad you are doing better!

## 2016-11-28 NOTE — Assessment & Plan Note (Signed)
Vague dyspepsia/dysphagia symptoms markedly improved on Dexilant. Will continue this for now. No overt signs of GI bleeding. Follow-up with IR in Oct 2018 due to history of chronic mesenteric ischemia s/p stenting. We will see him in 6 months.

## 2016-12-05 ENCOUNTER — Other Ambulatory Visit: Payer: Self-pay | Admitting: Cardiovascular Disease

## 2016-12-05 DIAGNOSIS — Z954 Presence of other heart-valve replacement: Secondary | ICD-10-CM | POA: Diagnosis not present

## 2016-12-05 DIAGNOSIS — E119 Type 2 diabetes mellitus without complications: Secondary | ICD-10-CM | POA: Diagnosis not present

## 2016-12-05 DIAGNOSIS — E1140 Type 2 diabetes mellitus with diabetic neuropathy, unspecified: Secondary | ICD-10-CM | POA: Diagnosis not present

## 2016-12-05 DIAGNOSIS — Z7901 Long term (current) use of anticoagulants: Secondary | ICD-10-CM | POA: Diagnosis not present

## 2016-12-05 LAB — COAGUCHEK XS/INR WAIVED
INR: 2.6 — ABNORMAL HIGH (ref 0.9–1.1)
Prothrombin Time: 31.6 s

## 2016-12-05 LAB — PROTIME-INR: INR: 2.6 — AB (ref <=1.1)

## 2016-12-06 ENCOUNTER — Ambulatory Visit (INDEPENDENT_AMBULATORY_CARE_PROVIDER_SITE_OTHER): Payer: Medicare Other | Admitting: Pharmacist Clinician (PhC)/ Clinical Pharmacy Specialist

## 2016-12-06 DIAGNOSIS — Z952 Presence of prosthetic heart valve: Secondary | ICD-10-CM

## 2016-12-06 DIAGNOSIS — Z7901 Long term (current) use of anticoagulants: Secondary | ICD-10-CM

## 2016-12-09 ENCOUNTER — Other Ambulatory Visit: Payer: Self-pay | Admitting: Cardiovascular Disease

## 2016-12-23 ENCOUNTER — Other Ambulatory Visit: Payer: Self-pay | Admitting: Cardiovascular Disease

## 2016-12-23 ENCOUNTER — Other Ambulatory Visit: Payer: Self-pay | Admitting: Physician Assistant

## 2016-12-31 DIAGNOSIS — I2510 Atherosclerotic heart disease of native coronary artery without angina pectoris: Secondary | ICD-10-CM | POA: Diagnosis not present

## 2016-12-31 DIAGNOSIS — I5032 Chronic diastolic (congestive) heart failure: Secondary | ICD-10-CM | POA: Diagnosis not present

## 2016-12-31 DIAGNOSIS — J449 Chronic obstructive pulmonary disease, unspecified: Secondary | ICD-10-CM | POA: Diagnosis not present

## 2016-12-31 DIAGNOSIS — E1140 Type 2 diabetes mellitus with diabetic neuropathy, unspecified: Secondary | ICD-10-CM | POA: Diagnosis not present

## 2017-01-01 ENCOUNTER — Other Ambulatory Visit: Payer: Self-pay | Admitting: Cardiovascular Disease

## 2017-01-01 ENCOUNTER — Ambulatory Visit (INDEPENDENT_AMBULATORY_CARE_PROVIDER_SITE_OTHER): Payer: Medicare Other | Admitting: Pharmacist

## 2017-01-01 DIAGNOSIS — Z952 Presence of prosthetic heart valve: Secondary | ICD-10-CM

## 2017-01-01 DIAGNOSIS — Z7901 Long term (current) use of anticoagulants: Secondary | ICD-10-CM | POA: Diagnosis not present

## 2017-01-01 DIAGNOSIS — Z954 Presence of other heart-valve replacement: Secondary | ICD-10-CM | POA: Diagnosis not present

## 2017-01-01 LAB — COAGUCHEK XS/INR WAIVED
INR: 2.3 — ABNORMAL HIGH (ref 0.9–1.1)
PROTHROMBIN TIME: 27.2 s

## 2017-01-01 LAB — PROTIME-INR: INR: 2.3 — AB (ref 0.9–1.1)

## 2017-01-07 ENCOUNTER — Other Ambulatory Visit: Payer: Self-pay | Admitting: Cardiovascular Disease

## 2017-01-07 NOTE — Telephone Encounter (Signed)
Rx request sent to pharmacy.  

## 2017-01-14 IMAGING — CR DG CHEST 1V PORT
1 series · 1 of 1 positions shown · non-contrast
Comparison: 12/28/2014

CLINICAL DATA: Shortness of breath.

EXAM:
PORTABLE CHEST - 1 VIEW

[AP]
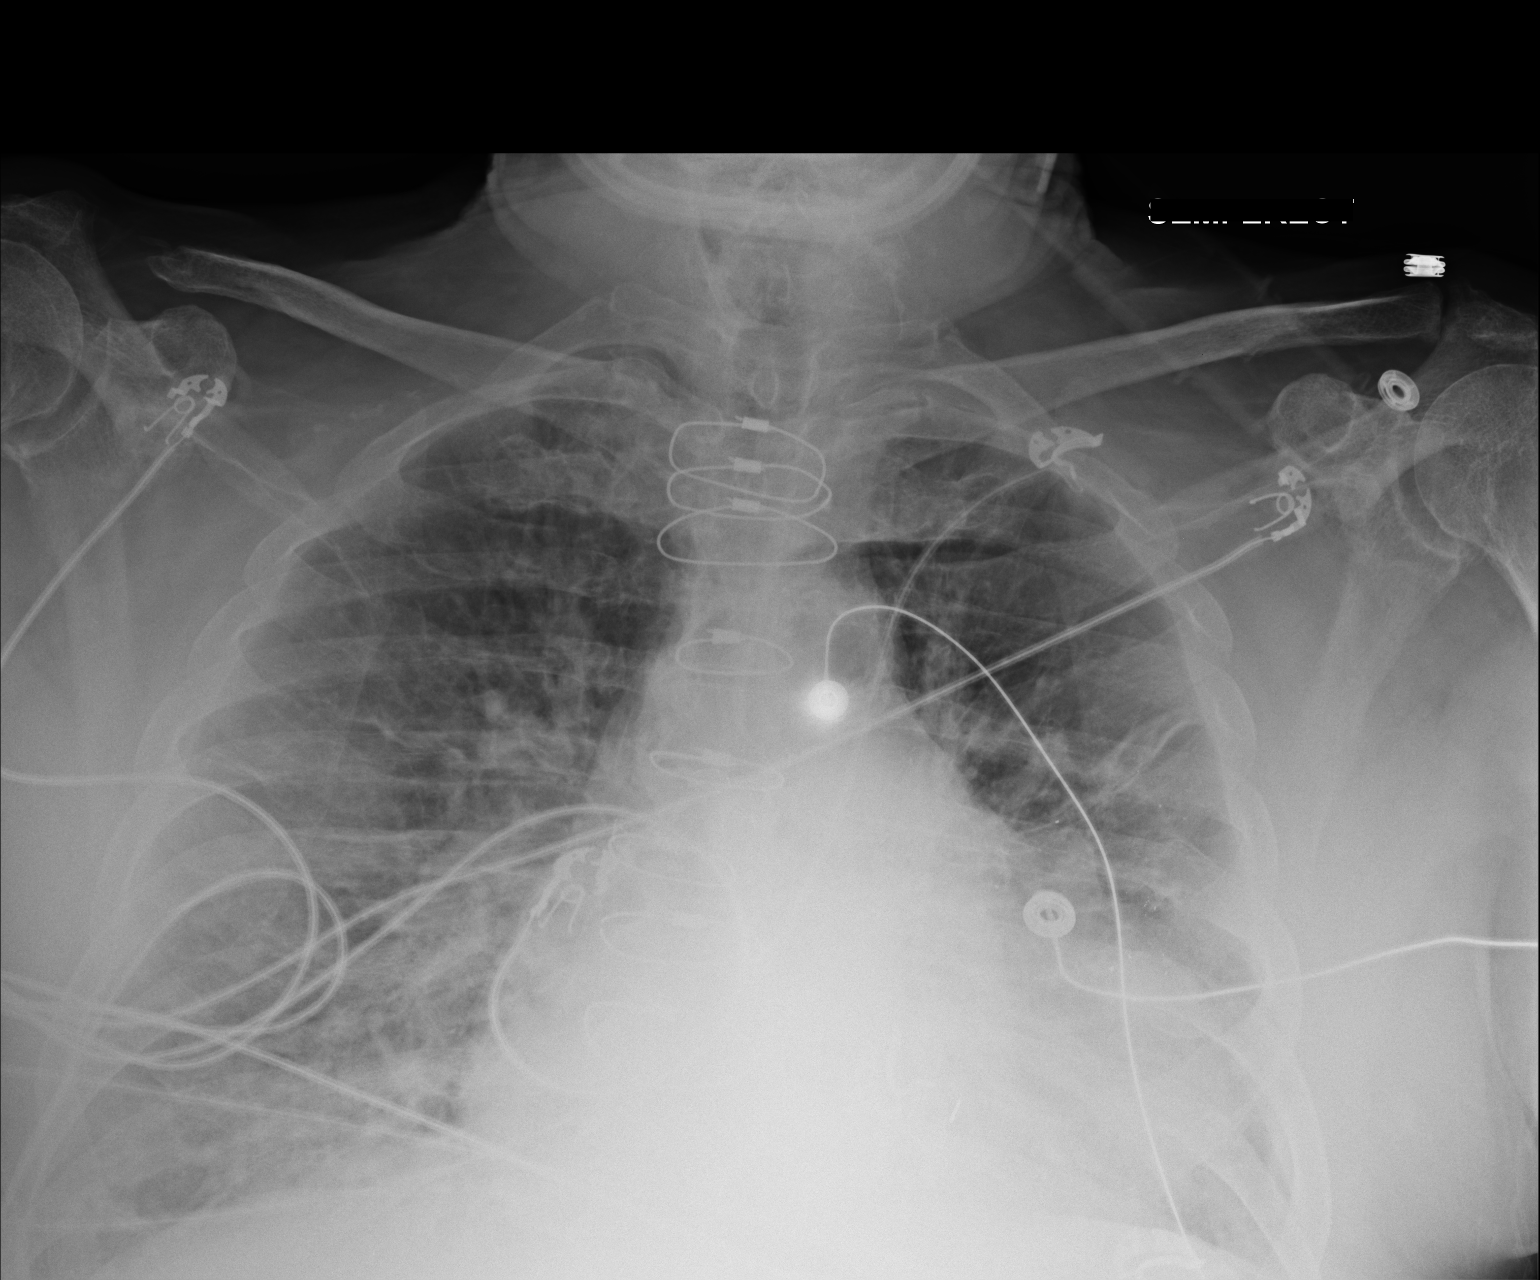

[1 of 1 positions shown; findings below may reference images not displayed]

FINDINGS: Postoperative changes in the mediastinum. Cardiac enlargement with
pulmonary vascular congestion and perihilar infiltrates suggesting
edema. Linear atelectasis in the left mid lung. No definite pleural
effusion although the entire costophrenic angles are not included
within the field of view. No pneumothorax. Old resection or
resorption of the distal right clavicle.
IMPRESSION: Cardiac enlargement with pulmonary vascular congestion and perihilar
edema.

## 2017-02-05 ENCOUNTER — Other Ambulatory Visit: Payer: Self-pay | Admitting: Cardiovascular Disease

## 2017-02-10 ENCOUNTER — Other Ambulatory Visit: Payer: Self-pay | Admitting: Cardiovascular Disease

## 2017-02-13 IMAGING — US IR ANGIO/VISCERAL SELECTIVE EA VESSEL WO/W FLUSH
1 series · 3 of 3 positions shown · non-contrast
Comparison: none

CLINICAL DATA: 74-year-old male with a history of signs and
symptoms of chronic mesenteric ischemia, with abdominal pain and
evidence of ischemic colitis on prior colonoscopy.

[Series 1: ir (id) (id)/(id)/(id) ir · 3 of 3 slices shown]
[im 1/3]
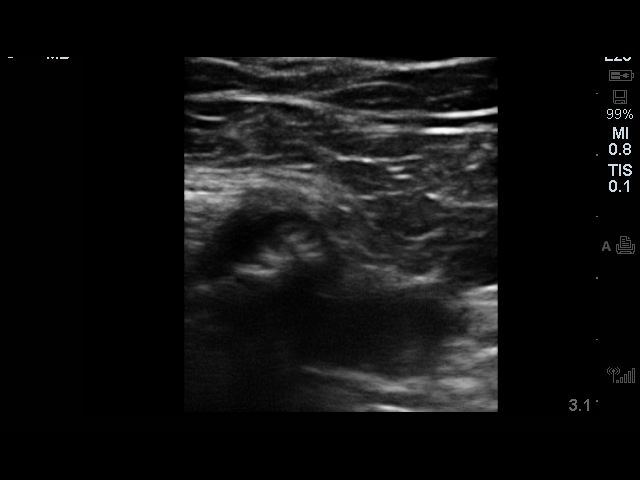
[im 2/3]
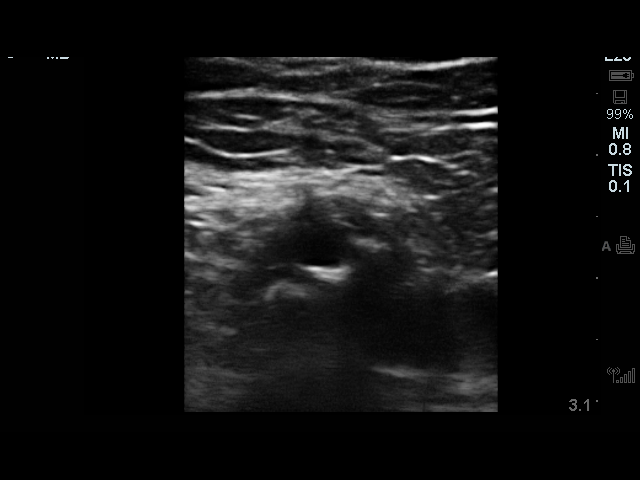
[im 3/3]
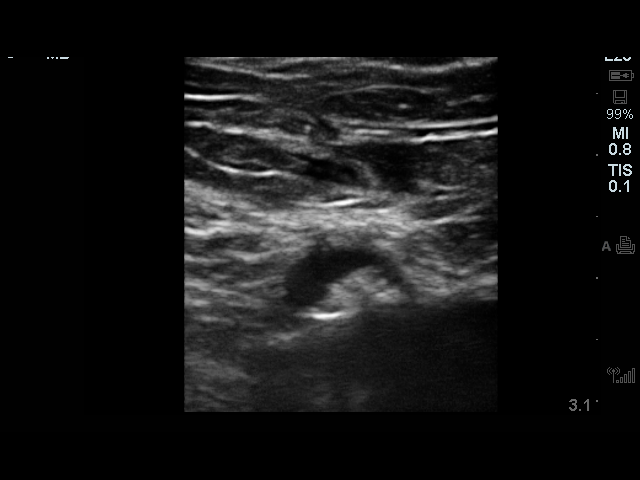

[3 of 3 positions shown; findings below may reference images not displayed]

He has been referred for evaluation of angiogram and possible
stenting of the superior mesenteric artery.

EXAM:
ULTRASOUND GUIDED ACCESS OF RIGHT COMMON FEMORAL ARTERY.

AORTIC ANGIOGRAM

SUPERIOR MESENTERIC ARTERY ANGIOGRAM

COVERED STENTING OF SIGNIFICANT STENOSIS OF THE SUPERIOR MESENTERIC
ARTERY ORIGIN, WITH REPEAT SUPERIOR MESENTERIC ARTERY ANGIOGRAM

ANESTHESIA/SEDATION:
3.0 Mg IV Versed; 175 mcg IV Fentanyl.

Total Moderate Sedation Time: 120 minutes

Additional Medications: 5111 units intraoperative heparin bolus

FLUOROSCOPY TIME:  26 minutes 30 seconds

PROCEDURE:
The procedure, risks, benefits, and alternatives were explained to
the patient and to the patient's family. Questions regarding the
procedure were encouraged and answered. Specific risks discussed
include: Bleeding, infection, renal artery injury, contrast
reaction, possible dialysis, arterial injury/ dissection, closure of
the superior mesenteric artery, need for further procedure or
surgery, disability, cardiopulmonary collapse, death. The patient
understands and consents to the procedure.

The right groin was prepped with Betadine in a sterile fashion, and
a sterile drape was applied covering the operative field. A sterile
gown and sterile gloves were used for the procedure. Local
anesthesia was provided with 1% Lidocaine.

Ultrasound survey of the right inguinal region was performed with
images stored and sent to PACs.

A micropuncture needle was used access the right common femoral
artery under ultrasound. With excellent arterial blood flow
returned, and an 018 micro wire was passed through the needle,
observed enter the abdominal aorta under fluoroscopy. The needle was
removed, and a micropuncture sheath was placed over the wire. The
inner dilator and wire were removed, and an 035 Bentson wire was
advanced under fluoroscopy into the abdominal aorta. The sheath was
removed and a standard 5 French vascular sheath was placed. The
dilator was removed and the sheath was flushed.

Five French pigtail Flush catheter was advanced into the aorta and
an aortogram was completed, including lateral image.

The Amplatz wire was then placed through the pigtail catheter and a
6 French flexor La Fenice Sarigu was placed.

Cobra catheter and Glidewire were used to select the superior
mesenteric artery. Superior mesenteric artery angiogram was
performed.

Once a Glidewire was across the stenosis, a 4 French glide cath was
advanced into the distal superior mesenteric artery. A Rosen wire
was then advanced into the artery. A combination of the Rosen wire,
a 4 French glide cath, an 035 Amplatz wire, and the Cobra catheter
were used to sequentially balloon angioplasty the stenotic origin of
the superior mesenteric artery with a 4 mm diameter balloon and
subsequently a 5 mm diameter balloon.

Once angioplasty had been performed, overlapping atrium balloon
mounted covered stents were deployed across the disease of the
proximal superior mesenteric artery. Distally a 5 mm x 22 mm covered
stent was deployed with post dilation. Approximately a 6 mm x 22 mm
covered stent was deployed extending slightly beyond the ostium, and
overlapping the more distal stent by just less than 1 cm.

The stent construct was post dilated to 6 mm, and the stent at the
ostium was post dilated to 8 mm.

Final angiogram was performed.

Patient tolerated the procedure well and remained hemodynamically
stable throughout.

No complications were encountered and no significant blood loss was
encountered.

Manual pressure was used for hemostasis at the right common femoral
artery.

COMPLICATIONS:
None.
FINDINGS: Abdominal aorta g demonstrates atherosclerotic changes along the
length of the aorta. No aneurysm or dissection flap is identified.
Dense calcium present along the distal abdominal aorta.

Bilateral renal arteries are patent. There is approximately 50%
stenosis at the origin of the left renal artery.

Temporarily, the celiac artery fills with the renal arteries, with
delayed filling of the superior mesenteric artery, compatible with
hemodynamically significant stenosis.

Distally, there is 2 directional flow at the inferior mesenteric
artery, likely with collateral filling from the left call it artery
and the superior rectal artery.

Lumbar arteries are patent.

Extensive atherosclerotic changes of the bilateral iliac system.
Bilateral hypogastric arteries are approximately 50% stenotic at the
origins though patent distally. There is a stent at the distal right
external iliac artery within the pelvis, with in stent stenosis of
approximately 50% narrowing. The sheath was not occlusive during the
case. Right common femoral artery atherosclerotic changes are
greater than the left common femoral artery.

Plaque at the proximal 3 cm of the superior mesenteric artery
contributes to significant stenosis and decreased filling though the
artery remains patent. There is approximately 80 degree angulation
beyond the first 4 cm of the superior mesenteric artery, of the
location of significant calcification.

Status post balloon angioplasty and stenting of the proximal
superior mesenteric artery there is resolution of the previous
identified stenosis. Calcified plaque beyond the stents do not
contribute to significant stenosis with improved filling. Further
balloon angioplasty and stenting was not performed at this
angulation of the artery to improve the angiographic outcome given
the increased risk for dissection at this contour.
IMPRESSION: Status post abdominal aortic angiogram, superior mesenteric artery
angiogram, with angioplasty and covered stenting of hemodynamically
significant stenosis of the proximal superior mesenteric artery,
with resolution of the stenosis after treatment.

Angiogram demonstrates extensive changes of atherosclerotic disease
involving the aorta and mesenteric vessels, including less than 50%
stenosis of the proximal left renal artery, hemodynamically
significant stenosis at the origin of the inferior mesenteric
artery, and significant disease of the bilateral iliac systems.

PLAN:
The patient will have his right leg straight for 6 hours status post
manual compression for hemostasis at the puncture site.

Hydration for 8 hours with normal saline.

Recommend continued Plavix for at least 3 months after the procedure
for stent patency. Dual anti-platelet would usually be recommended
with aspirin, however, the patient will be restarted on Coumadin
anti coagulation for his heart valve disease.

## 2017-03-03 DIAGNOSIS — Z1389 Encounter for screening for other disorder: Secondary | ICD-10-CM | POA: Diagnosis not present

## 2017-03-03 DIAGNOSIS — E1140 Type 2 diabetes mellitus with diabetic neuropathy, unspecified: Secondary | ICD-10-CM | POA: Diagnosis not present

## 2017-03-03 DIAGNOSIS — G547 Phantom limb syndrome without pain: Secondary | ICD-10-CM | POA: Diagnosis not present

## 2017-03-03 DIAGNOSIS — G629 Polyneuropathy, unspecified: Secondary | ICD-10-CM | POA: Diagnosis not present

## 2017-03-03 DIAGNOSIS — J449 Chronic obstructive pulmonary disease, unspecified: Secondary | ICD-10-CM | POA: Diagnosis not present

## 2017-03-07 DIAGNOSIS — I25810 Atherosclerosis of coronary artery bypass graft(s) without angina pectoris: Secondary | ICD-10-CM | POA: Diagnosis not present

## 2017-03-07 DIAGNOSIS — M1991 Primary osteoarthritis, unspecified site: Secondary | ICD-10-CM | POA: Diagnosis not present

## 2017-03-07 DIAGNOSIS — J449 Chronic obstructive pulmonary disease, unspecified: Secondary | ICD-10-CM | POA: Diagnosis not present

## 2017-03-08 ENCOUNTER — Emergency Department (HOSPITAL_COMMUNITY): Payer: Medicare Other

## 2017-03-08 ENCOUNTER — Encounter (HOSPITAL_COMMUNITY): Payer: Self-pay | Admitting: *Deleted

## 2017-03-08 ENCOUNTER — Inpatient Hospital Stay (HOSPITAL_COMMUNITY)
Admission: EM | Admit: 2017-03-08 | Discharge: 2017-03-12 | DRG: 871 | Disposition: A | Discharge status: Home Health | Payer: Medicare Other | Attending: Internal Medicine | Admitting: Internal Medicine

## 2017-03-08 DIAGNOSIS — Z952 Presence of prosthetic heart valve: Secondary | ICD-10-CM | POA: Diagnosis not present

## 2017-03-08 DIAGNOSIS — J9600 Acute respiratory failure, unspecified whether with hypoxia or hypercapnia: Secondary | ICD-10-CM | POA: Diagnosis not present

## 2017-03-08 DIAGNOSIS — K219 Gastro-esophageal reflux disease without esophagitis: Secondary | ICD-10-CM | POA: Diagnosis present

## 2017-03-08 DIAGNOSIS — I5022 Chronic systolic (congestive) heart failure: Secondary | ICD-10-CM | POA: Diagnosis not present

## 2017-03-08 DIAGNOSIS — G8929 Other chronic pain: Secondary | ICD-10-CM | POA: Diagnosis not present

## 2017-03-08 DIAGNOSIS — J441 Chronic obstructive pulmonary disease with (acute) exacerbation: Secondary | ICD-10-CM | POA: Diagnosis not present

## 2017-03-08 DIAGNOSIS — A419 Sepsis, unspecified organism: Principal | ICD-10-CM | POA: Diagnosis present

## 2017-03-08 DIAGNOSIS — Z955 Presence of coronary angioplasty implant and graft: Secondary | ICD-10-CM | POA: Diagnosis not present

## 2017-03-08 DIAGNOSIS — G4730 Sleep apnea, unspecified: Secondary | ICD-10-CM | POA: Diagnosis present

## 2017-03-08 DIAGNOSIS — Z7901 Long term (current) use of anticoagulants: Secondary | ICD-10-CM | POA: Diagnosis not present

## 2017-03-08 DIAGNOSIS — M549 Dorsalgia, unspecified: Secondary | ICD-10-CM | POA: Diagnosis present

## 2017-03-08 DIAGNOSIS — J9690 Respiratory failure, unspecified, unspecified whether with hypoxia or hypercapnia: Secondary | ICD-10-CM

## 2017-03-08 DIAGNOSIS — Z87891 Personal history of nicotine dependence: Secondary | ICD-10-CM

## 2017-03-08 DIAGNOSIS — E119 Type 2 diabetes mellitus without complications: Secondary | ICD-10-CM

## 2017-03-08 DIAGNOSIS — J440 Chronic obstructive pulmonary disease with acute lower respiratory infection: Secondary | ICD-10-CM | POA: Diagnosis not present

## 2017-03-08 DIAGNOSIS — E872 Acidosis: Secondary | ICD-10-CM | POA: Diagnosis present

## 2017-03-08 DIAGNOSIS — E1151 Type 2 diabetes mellitus with diabetic peripheral angiopathy without gangrene: Secondary | ICD-10-CM | POA: Diagnosis present

## 2017-03-08 DIAGNOSIS — Z794 Long term (current) use of insulin: Secondary | ICD-10-CM

## 2017-03-08 DIAGNOSIS — J189 Pneumonia, unspecified organism: Secondary | ICD-10-CM | POA: Diagnosis not present

## 2017-03-08 DIAGNOSIS — I447 Left bundle-branch block, unspecified: Secondary | ICD-10-CM | POA: Diagnosis present

## 2017-03-08 DIAGNOSIS — Z87442 Personal history of urinary calculi: Secondary | ICD-10-CM

## 2017-03-08 DIAGNOSIS — E7800 Pure hypercholesterolemia, unspecified: Secondary | ICD-10-CM | POA: Diagnosis not present

## 2017-03-08 DIAGNOSIS — I110 Hypertensive heart disease with heart failure: Secondary | ICD-10-CM | POA: Diagnosis not present

## 2017-03-08 DIAGNOSIS — J449 Chronic obstructive pulmonary disease, unspecified: Secondary | ICD-10-CM | POA: Diagnosis not present

## 2017-03-08 DIAGNOSIS — R05 Cough: Secondary | ICD-10-CM | POA: Diagnosis not present

## 2017-03-08 DIAGNOSIS — J9621 Acute and chronic respiratory failure with hypoxia: Secondary | ICD-10-CM | POA: Diagnosis not present

## 2017-03-08 DIAGNOSIS — I2510 Atherosclerotic heart disease of native coronary artery without angina pectoris: Secondary | ICD-10-CM | POA: Diagnosis not present

## 2017-03-08 DIAGNOSIS — E1122 Type 2 diabetes mellitus with diabetic chronic kidney disease: Secondary | ICD-10-CM

## 2017-03-08 DIAGNOSIS — Z951 Presence of aortocoronary bypass graft: Secondary | ICD-10-CM

## 2017-03-08 DIAGNOSIS — I10 Essential (primary) hypertension: Secondary | ICD-10-CM | POA: Diagnosis not present

## 2017-03-08 DIAGNOSIS — E1142 Type 2 diabetes mellitus with diabetic polyneuropathy: Secondary | ICD-10-CM | POA: Diagnosis not present

## 2017-03-08 DIAGNOSIS — I252 Old myocardial infarction: Secondary | ICD-10-CM

## 2017-03-08 DIAGNOSIS — R531 Weakness: Secondary | ICD-10-CM | POA: Diagnosis not present

## 2017-03-08 DIAGNOSIS — Z9842 Cataract extraction status, left eye: Secondary | ICD-10-CM

## 2017-03-08 DIAGNOSIS — Z9841 Cataract extraction status, right eye: Secondary | ICD-10-CM

## 2017-03-08 DIAGNOSIS — G9341 Metabolic encephalopathy: Secondary | ICD-10-CM | POA: Diagnosis present

## 2017-03-08 DIAGNOSIS — R509 Fever, unspecified: Secondary | ICD-10-CM

## 2017-03-08 DIAGNOSIS — R404 Transient alteration of awareness: Secondary | ICD-10-CM | POA: Diagnosis not present

## 2017-03-08 DIAGNOSIS — M1990 Unspecified osteoarthritis, unspecified site: Secondary | ICD-10-CM | POA: Diagnosis present

## 2017-03-08 DIAGNOSIS — R0682 Tachypnea, not elsewhere classified: Secondary | ICD-10-CM | POA: Diagnosis not present

## 2017-03-08 DIAGNOSIS — Z833 Family history of diabetes mellitus: Secondary | ICD-10-CM

## 2017-03-08 DIAGNOSIS — M109 Gout, unspecified: Secondary | ICD-10-CM | POA: Diagnosis not present

## 2017-03-08 DIAGNOSIS — I959 Hypotension, unspecified: Secondary | ICD-10-CM | POA: Diagnosis present

## 2017-03-08 DIAGNOSIS — J410 Simple chronic bronchitis: Secondary | ICD-10-CM | POA: Diagnosis not present

## 2017-03-08 DIAGNOSIS — J9601 Acute respiratory failure with hypoxia: Secondary | ICD-10-CM | POA: Diagnosis not present

## 2017-03-08 DIAGNOSIS — I509 Heart failure, unspecified: Secondary | ICD-10-CM | POA: Diagnosis not present

## 2017-03-08 DIAGNOSIS — E785 Hyperlipidemia, unspecified: Secondary | ICD-10-CM | POA: Diagnosis present

## 2017-03-08 DIAGNOSIS — Z9981 Dependence on supplemental oxygen: Secondary | ICD-10-CM

## 2017-03-08 DIAGNOSIS — E1100 Type 2 diabetes mellitus with hyperosmolarity without nonketotic hyperglycemic-hyperosmolar coma (NKHHC): Secondary | ICD-10-CM | POA: Diagnosis not present

## 2017-03-08 LAB — URINALYSIS, ROUTINE W REFLEX MICROSCOPIC
Bilirubin Urine: NEGATIVE
GLUCOSE, UA: NEGATIVE mg/dL
Hgb urine dipstick: NEGATIVE
Ketones, ur: NEGATIVE mg/dL
LEUKOCYTES UA: NEGATIVE
Nitrite: NEGATIVE
PH: 5 (ref 5.0–8.0)
Protein, ur: NEGATIVE mg/dL
SPECIFIC GRAVITY, URINE: 1.013 (ref 1.005–1.030)

## 2017-03-08 LAB — CBC
HCT: 37.4 % — ABNORMAL LOW (ref 39.0–52.0)
Hemoglobin: 12.2 g/dL — ABNORMAL LOW (ref 13.0–17.0)
MCH: 30.4 pg (ref 26.0–34.0)
MCHC: 32.6 g/dL (ref 30.0–36.0)
MCV: 93.3 fL (ref 78.0–100.0)
PLATELETS: 260 10*3/uL (ref 150–400)
RBC: 4.01 MIL/uL — ABNORMAL LOW (ref 4.22–5.81)
RDW: 16.7 % — AB (ref 11.5–15.5)
WBC: 13.6 10*3/uL — AB (ref 4.0–10.5)

## 2017-03-08 LAB — COMPREHENSIVE METABOLIC PANEL
ALT: 18 U/L (ref 17–63)
AST: 48 U/L — AB (ref 15–41)
Albumin: 3.5 g/dL (ref 3.5–5.0)
Alkaline Phosphatase: 31 U/L — ABNORMAL LOW (ref 38–126)
Anion gap: 11 (ref 5–15)
BILIRUBIN TOTAL: 1.1 mg/dL (ref 0.3–1.2)
BUN: 34 mg/dL — AB (ref 6–20)
CHLORIDE: 106 mmol/L (ref 101–111)
CO2: 25 mmol/L (ref 22–32)
CREATININE: 1.56 mg/dL — AB (ref 0.61–1.24)
Calcium: 9.5 mg/dL (ref 8.9–10.3)
GFR calc Af Amer: 48 mL/min — ABNORMAL LOW (ref >60)
GFR, EST NON AFRICAN AMERICAN: 41 mL/min — AB (ref >60)
GLUCOSE: 105 mg/dL — AB (ref 65–99)
Potassium: 4.1 mmol/L (ref 3.5–5.1)
Sodium: 142 mmol/L (ref 135–145)
Total Protein: 7.1 g/dL (ref 6.5–8.1)

## 2017-03-08 LAB — PROTIME-INR
INR: 1.87
Prothrombin Time: 21.8 seconds — ABNORMAL HIGH (ref 11.4–15.2)

## 2017-03-08 LAB — LACTIC ACID, PLASMA: Lactic Acid, Venous: 2 mmol/L (ref 0.5–1.9)

## 2017-03-08 LAB — CBG MONITORING, ED: Glucose-Capillary: 106 mg/dL — ABNORMAL HIGH (ref 65–99)

## 2017-03-08 MED ORDER — DEXTROSE 5 % IV SOLN
500.0000 mg | INTRAVENOUS | Status: DC
  Administered 2017-03-08 – 2017-03-11 (×4): 500 mg via INTRAVENOUS
  Filled 2017-03-08 (×5): qty 500

## 2017-03-08 MED ORDER — IPRATROPIUM-ALBUTEROL 0.5-2.5 (3) MG/3ML IN SOLN
3.0000 mL | Freq: Once | RESPIRATORY_TRACT | Status: AC
  Administered 2017-03-08: 3 mL via RESPIRATORY_TRACT
  Filled 2017-03-08: qty 3

## 2017-03-08 MED ORDER — SODIUM CHLORIDE 0.9 % IV BOLUS (SEPSIS)
1000.0000 mL | Freq: Once | INTRAVENOUS | Status: AC
  Administered 2017-03-08: 1000 mL via INTRAVENOUS

## 2017-03-08 MED ORDER — DEXTROSE 5 % IV SOLN
1.0000 g | Freq: Once | INTRAVENOUS | Status: AC
  Administered 2017-03-08: 1 g via INTRAVENOUS
  Filled 2017-03-08: qty 10
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
## 2017-03-08 NOTE — ED Triage Notes (Signed)
Pt arrived to er by ems with c/o confusion that started yesterday, pt admits to cough with is productive with brown sputum, wheezing, pt was given 2.5 mg albuterol treatment by ems with improvement of symptoms, pt room air pulse ox was mid 70's upon ems arrival to residence, upon arrival to er pulse ox 91% on RA, fever 104.4 orally.

## 2017-03-08 NOTE — ED Notes (Signed)
Pt more alert, more conversant  Daughters at bedside

## 2017-03-08 NOTE — ED Notes (Signed)
CRITICAL VALUE ALERT  Critical Value:  Lactic acid 2.0  Date & Time Notied:  03/08/2017 @21 :58  Provider Notified: Dr Alvino Chapel  Orders Received/Actions taken: no additional orders received,

## 2017-03-08 NOTE — H&P (Signed)
History and Physical    EZRA DENNE XBD:[id number] DOB: Jan 29, 1941 DOA: 03/08/2017  PCP: Redmond School, MD  Patient coming from: Home.    Chief Complaint:  Confusion, weakness, and fever.   HPI: Tanner Pena is an 76 y.o. male with hx of COPD, mechanical AVR, PVD, CAD s/p CAD, hx of HTN, GI Bleed, CHF, HLD, DM2, presented to the ER with feeling weakness, confused, fever to 101.4, coughs, wheezing and SOB.  In the ER he was found to have very soft BP, and was given IVF.  His sensorium and delirium improved, and he was able to converse meaningfully.  Evaluation in the ER showed CXR with PNA, and Cr of 1.5, WBC of 13K.  His INR was 1.8.  Lactic acid was 2.0, and UA showed No evidence of infection.  Hospitalist was asked to admit him for CAP.   ED Course:  See above.  Rewiew of Systems:  Constitutional: Negative for malaise, fever and chills. No significant weight loss or weight gain Eyes: Negative for eye pain, redness and discharge, diplopia, visual changes, or flashes of light. ENMT: Negative for ear pain, hoarseness, nasal congestion, sinus pressure and sore throat. No headaches; tinnitus, drooling, or problem swallowing. Cardiovascular: Negative for chest pain, palpitations, diaphoresis, dyspnea and peripheral edema. ; No orthopnea, PND Respiratory: Negative for hemoptysis,  and stridor. No pleuritic chestpain. Gastrointestinal: Negative for diarrhea, constipation,  melena, blood in stool, hematemesis, jaundice and rectal bleeding.    Genitourinary: Negative for frequency, dysuria, incontinence,flank pain and hematuria; Musculoskeletal: Negative for back pain and neck pain. Negative for swelling and trauma.;  Skin: . Negative for pruritus, rash, abrasions, bruising and skin lesion.; ulcerations Neuro: Negative for headache, lightheadedness and neck stiffness. Negative for weakness, altered level of consciousness , altered mental status, extremity weakness, burning feet,  involuntary movement, seizure and syncope.  Psych: negative for anxiety, depression, insomnia, tearfulness, panic attacks, hallucinations, paranoia, suicidal or homicidal ideation    Past Medical History:  Diagnosis Date  . Acute blood loss anemia 02/2015 & 05/2015   Hemoglobin 5.5 on 05/31/15; status post transfusion.  . Arthritis   . CAD (coronary artery disease)    a. 05/13/14 [name] s/p overlapping DESx2 to SVG to RCA. b.  s/p CABG '90 with redo '94 & stent to RCA SVG in 2005  . Chronic back pain   . Chronic systolic heart failure ([place])   . Chronic toe ulcer ([place])    a. Left foot  . COPD (chronic obstructive pulmonary disease) ([place])   . Critical lower limb ischemia   . Dysphagia   . Essential hypertension   . GERD (gastroesophageal reflux disease)   . GI bleeding 05/31/2015   Source not identified.  . Gout   . History of kidney stones   . Hypercholesteremia   . Myocardial infarction ([place])   . Neuromuscular disorder ([place])   . Peripheral neuropathy   . Peripheral vascular disease ([place])    Lower extremity PCI/stenting  . S/P aortic valve replacement 1990   a. St. Jude  . Sleep apnea   . Type 2 diabetes mellitus ([place]) 2007    Past Surgical History:  Procedure Laterality Date  . AMPUTATION Left 07/13/2014   Procedure: Transmetatarsal Amputation;  Surgeon: [name], MD;  Location: [place];  Service: Orthopedics;  Laterality: Left;  . AMPUTATION Left 08/20/2014   Procedure: Revision Transmetatarsal Amputation versus Below Knee Amputation;  Surgeon: [name], MD;  Location: [place];  Service: Orthopedics;  Laterality: Left;  . ANGIOPLASTY ILLIAC ARTERY    . AORTIC VALVE REPLACEMENT  1990   St. Jude  . BACK SURGERY  [id number]   2  . BIOPSY N/A 06/20/2015   Procedure: BIOPSY;  Surgeon: Danie Binder, MD;  Location: [facility];  Service: Endoscopy;  Laterality: N/A;  . CATARACT EXTRACTION     bilateral  . COLONOSCOPY  05/2011   Dr. [name]: benign rectal polyp, left sided tics,  ascending colonic ulcers (path c/w ischemia)  . COLONOSCOPY WITH PROPOFOL N/A 06/01/2015   RMR: normal appearing rectal mucosa. Scattered left-sided diverticula. the remainder of the colonic mucosa appeared normal. the distal 5 cm of terminal ileal mucosa also appeared normal. retroflexion was performed.   . CORONARY ARTERY BYPASS GRAFT  1994   6 vessels  . CORONARY STENT PLACEMENT  2005   RCA vein graft A 3.0x13.0 TAXUS stent was then placed int he vessel a Viva 3.0x4.0 (perfusion balloon was made ready it was placed through the entire lenght of the stent  . ESOPHAGOGASTRODUODENOSCOPY N/A 02/09/2015   DR. Schooler: Normal EGD  . ESOPHAGOGASTRODUODENOSCOPY  05/2011   Dr. [name]: s/p esophageal dilation, antral erosions/nodularity with benign biopsies  . 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 ESOPHAGOGASTRODUODENOSCOPY (EGD) WITH PROPOFOL N/A 06/20/2015   Procedure: ESOPHAGOGASTRODUODENOSCOPY (EGD) WITH PROPOFOL;  Surgeon: Danie Binder, MD;  Location: [facility];  Service: Endoscopy;  Laterality: N/A;  . EYE SURGERY    . GIVENS CAPSULE STUDY N/A 06/08/2015   Procedure: GIVENS CAPSULE STUDY;  Surgeon: [name], MD;  [facility];  Service: Endoscopy;  Laterality: N/A;  0700   . IR GENERIC HISTORICAL  06/06/2016   IR RADIOLOGIST EVAL & MGMT 06/06/2016 Corrie Mckusick, DO [facility] INTERV RAD  . LEFT HEART CATHETERIZATION WITH CORONARY ANGIOGRAM N/A 05/11/2014   Procedure: LEFT HEART CATHETERIZATION WITH CORONARY ANGIOGRAM;  Surgeon: [name], MD;  Location: [facility] CATH LAB;  Service: Cardiovascular;  Laterality: N/A;  . LOWER EXTREMITY ANGIOGRAM N/A 02/15/2013   Procedure: LOWER EXTREMITY ANGIOGRAM;  Surgeon: [name], MD;  Location: [facility] CATH LAB;  Service: Cardiovascular;  Laterality: N/A;  . LOWER EXTREMITY ANGIOGRAM N/A 06/06/2014    Procedure: LOWER EXTREMITY ANGIOGRAM;  Surgeon: [name], MD;  Location: [facility] CATH LAB;  Service: Cardiovascular;  Laterality: N/A;  . [name]  06/13/2011   Procedure: Venia Minks DILATION;  Surgeon: [name], MD;  Location: [facility];  Service: Endoscopy;  Laterality: N/A;  Dilated to 56.   [name] PERCUTANEOUS CORONARY STENT INTERVENTION (PCI-S) N/A 05/13/2014   Procedure: PERCUTANEOUS CORONARY STENT INTERVENTION (PCI-S);  Surgeon: [name], MD;  Location: [facility] CATH LAB;  Service: Cardiovascular;  Laterality: N/A;  . Peripheral vascular procedures lower extremities     Right external iliac  artery PTA and stenting as well as bilateral SFA intervention remotely. Repeat procedures in 2011 bilaterally  . ROTATOR CUFF REPAIR     right  . STUMP REVISION Left 09/23/2014   Procedure: Revision Left Below Knee Amputation;  Surgeon: [name], MD;  Location: [place];  Service: Orthopedics;  Laterality: Left;  . STUMP REVISION Left 10/13/2014   Procedure: REVISION LEFT BELOW KNEE AMPUTATION STUMP;  Surgeon: [name], MD;  Location: [facility];  Service: Orthopedics;  Laterality: Left;     reports that he quit smoking about  2 years ago. His smoking use included Cigarettes. He started smoking about 63 years ago. He has a 55.00 pack-year smoking history. His smokeless tobacco use includes Chew. He reports that he does not drink alcohol or use drugs.  No Known Allergies  Family History  Problem Relation Age of Onset  . Diabetes Father   . Heart failure Other   . Colon cancer Neg Hx   . Liver disease Neg Hx      Prior to Admission medications   Medication Sig Start Date End Date Taking? Authorizing Provider  albuterol (PROVENTIL) 4 MG tablet Take 4 mg by mouth 2 (two) times daily.  01/20/15  Yes [provider]  allopurinol (ZYLOPRIM) 300 MG tablet Take 300 mg by mouth daily.   Yes [provider]  amoxicillin-clavulanate (AUGMENTIN) 875-125 MG tablet Take 1  tablet by mouth 2 (two) times daily. 03/03/17  Yes [provider]  cholecalciferol (VITAMIN D) 1000 UNITS tablet Take 2,000 Units by mouth daily.   Yes [provider]  COMBIVENT RESPIMAT 20-100 MCG/ACT AERS respimat Inhale 1 puff into the lungs every 6 (six) hours as needed for wheezing or shortness of breath.  02/18/17  Yes [provider]  dexlansoprazole (DEXILANT) 60 MG capsule Take 1 capsule (60 mg total) by mouth daily. 10/23/16  Yes Annitta Needs, NP  fenofibrate (TRICOR) 145 MG tablet TAKE 1 TABLET BY MOUTH ONCE DAILY FOR CHOLESTEROL. 02/05/17  Yes Troy Sine, MD  fish oil-omega-3 fatty acids 1000 MG capsule Take 1 capsule (1 g total) by mouth 2 (two) times daily. 01/22/13  Yes Alvstad, Kristin L, RPH-CPP  furosemide (LASIX) 80 MG tablet Take 1 tablet (80 mg total) by mouth daily. 12/07/15  Yes Kathie Dike, MD  glimepiride (AMARYL) 1 MG tablet Take 2 mg by mouth daily.    Yes [provider]  Insulin Detemir (LEVEMIR) 100 UNIT/ML Pen Inject 20 Units into the skin daily. Patient taking differently: Inject 30-40 Units into the skin 2 (two) times daily.  12/07/15  Yes Kathie Dike, MD  isosorbide mononitrate (IMDUR) 60 MG 24 hr tablet TAKE ONE TABLET BY MOUTH ONCE DAILY. 06/12/16  Yes Troy Sine, MD  metoprolol succinate (TOPROL-XL) 25 MG 24 hr tablet TAKE ONE TABLET BY MOUTH IN THE MORNING AND 1/2 TABLET IN THE EVENING. 06/12/16  Yes Troy Sine, MD  NITROSTAT 0.4 MG SL tablet PLACE 1 TAB UNDER TONGUE EVERY 5 MIN IF NEEDED FOR CHEST PAIN. MAY USE 3 TIMES.NO RELIEF CALL 911. 07/20/15  Yes Eileen Stanford, PA-C  oxyCODONE-acetaminophen (PERCOCET) 10-325 MG per tablet Take 1 tablet by mouth every 4 (four) hours as needed for pain. Patient taking differently: Take 1 tablet by mouth every 3 (three) hours as needed for pain.  09/23/14  Yes [name], MD  potassium chloride SA (K-DUR,KLOR-CON) 20 MEQ tablet Take 20 mEq by mouth daily.     Yes  [provider]  pregabalin (LYRICA) 50 MG capsule Take one capsule by mouth twice daily for pains Patient taking differently: Take 50 mg by mouth 3 (three) times daily.  08/23/14  Yes Pandey, Mahima, MD  ramipril (ALTACE) 5 MG capsule TAKE 1 CAPSULE BY MOUTH EVERY MORNING. 02/05/17  Yes Troy Sine, MD  rosuvastatin (CRESTOR) 20 MG tablet Take 1 tablet (20 mg total) by mouth daily. CALL FOR APPT FOR FURTHER REFILLS, thanks! 02/10/17  Yes Troy Sine, MD  sitaGLIPtin (JANUVIA) 50 MG tablet Take 50 mg by  mouth daily.   Yes [provider]  vitamin C (ASCORBIC ACID) 500 MG tablet Take 500 mg by mouth daily.   Yes [provider]  warfarin (COUMADIN) 5 MG tablet Take 1-1.5 tablets by mouth daily as directed by coumadin clinic Patient taking differently: Take 2.5-5 mg by mouth See admin instructions. Take 5mg  on Mondays and Fridays. Take 2.5mg  on all other days 12/23/16  Yes Troy Sine, MD  Insulin Pen Needle 29G X 12.7MM MISC Use as directed 12/07/15   Kathie Dike, MD    Physical Exam: Vitals:   03/08/17 2202 03/08/17 2230 03/08/17 2304 03/08/17 2330  BP:  (!) 124/105 (!) 100/48 (!) 93/40  Pulse:  70 91 84  Resp:  (!) 30 (!) 26 (!) 21  Temp:   99.5 F (37.5 C)   TempSrc:   Oral   SpO2: 98% 98% 97% 97%  Weight:      Height:       Constitutional: NAD, calm, comfortable Vitals:   03/08/17 2202 03/08/17 2230 03/08/17 2304 03/08/17 2330  BP:  (!) 124/105 (!) 100/48 (!) 93/40  Pulse:  70 91 84  Resp:  (!) 30 (!) 26 (!) 21  Temp:   99.5 F (37.5 C)   TempSrc:   Oral   SpO2: 98% 98% 97% 97%  Weight:      Height:       Eyes: PERRL, lids and conjunctivae normal ENMT: Mucous membranes are moist. Posterior pharynx clear of any exudate or lesions.Normal dentition.  Neck: normal, supple, no masses, no thyromegaly Respiratory: clear to auscultation bilaterally, no wheezing, no crackles. Normal respiratory effort. No accessory muscle use.  Cardiovascular:  Regular rate and rhythm, no murmurs / rubs / gallops. No extremity edema. 2+ pedal pulses. No carotid bruits.  Abdomen: no tenderness, no masses palpated. No hepatosplenomegaly. Bowel sounds positive.  Musculoskeletal: no clubbing / cyanosis. No joint deformity upper and lower extremities. Good ROM, no contractures. Normal muscle tone.  Skin: no rashes, lesions, ulcers. No induration Neurologic: CN 2-12 grossly intact. Sensation intact, DTR normal. Strength 5/5 in all 4.  Psychiatric: Normal judgment and insight. Alert and oriented x 3. Normal mood.    Labs on Admission: I have personally reviewed following labs and imaging studies CBC:  Recent Labs Lab 03/08/17 2053  WBC 13.6*  HGB 12.2*  HCT 37.4*  MCV 93.3  PLT 150   Basic Metabolic Panel:  Recent Labs Lab 03/08/17 2053  NA 142  K 4.1  CL 106  CO2 25  GLUCOSE 105*  BUN 34*  CREATININE 1.56*  CALCIUM 9.5   GFR: Estimated Creatinine Clearance: 49.1 mL/min (A) (by C-G formula based on SCr of 1.56 mg/dL (H)). Liver Function Tests:  Recent Labs Lab 03/08/17 2053  AST 48*  ALT 18  ALKPHOS 31*  BILITOT 1.1  PROT 7.1  ALBUMIN 3.5   Coagulation Profile:  Recent Labs Lab 03/08/17 2053  INR 1.87   CBG:  Recent Labs Lab 03/08/17 2110  GLUCAP 106*   Lipid Profile: Urine analysis:    Component Value Date/Time   COLORURINE YELLOW 03/08/2017 2140   APPEARANCEUR CLEAR 03/08/2017 2140   LABSPEC 1.013 03/08/2017 2140   PHURINE 5.0 03/08/2017 2140   GLUCOSEU NEGATIVE 03/08/2017 2140   HGBUR NEGATIVE 03/08/2017 2140   BILIRUBINUR NEGATIVE 03/08/2017 2140   KETONESUR NEGATIVE 03/08/2017 2140   PROTEINUR NEGATIVE 03/08/2017 2140   UROBILINOGEN 0.2 07/19/2010 1613   NITRITE NEGATIVE 03/08/2017 2140   LEUKOCYTESUR NEGATIVE 03/08/2017  2140   Radiological Exams on Admission: Dg Chest Port 1 View  Result Date: 03/08/2017 CLINICAL DATA:  Cough. EXAM: PORTABLE CHEST 1 VIEW COMPARISON:  12/06/2015 FINDINGS: The  cardio pericardial silhouette is enlarged. Retrocardiac left base collapse/consolidation is suspicious for pneumonia. Right lung appears hyperexpanded but clear. The visualized bony structures of the thorax are intact. Telemetry leads overlie the chest. IMPRESSION: Left base collapse/consolidation suspicious for pneumonia. Follow-up imaging recommended to ensure resolution. Electronically Signed   By: Misty Stanley M.D.   On: 03/08/2017 21:45    EKG: Independently reviewed.  Assessment/Plan Active Problems:   Long term current use of anticoagulant therapy   DM2 (diabetes mellitus, type 2) (HCC)   COPD (chronic obstructive pulmonary disease) (HCC)   LBBB (left bundle branch block)   HTN (hypertension)   Status post mechanical aortic valve replacement   S/P AVR (aortic valve replacement)   Sepsis (HCC)   PLAN:   CAP:  Will admit him to SDU given soft BP.  He has received some IVF.  Will hold Lasix, but avoid further IVF as he has hx of combined CHF.  Continue with IV Rocephin and IV Zithromax.  He is oxygnating well.   HTN:  BP is actually low at this time.  Hold ALL anti HTN meds.    COPD: stable.  He has minimal wheezing.  Will continue with neb Tx.  Michanical AVR:  Will continue with coumadin per pharmacy dosing.  If it does get to therapeutic tomorrow, should bridge it.   DM:  Will hold his meds.  Will use SSI if elevated.   Transient confusion:  Could be delirium from fever, hypoxia, or pain meds.  He is clearing up.     DVT prophylaxis: Coumadin Code Status: FULL CODE.  Family Communication: 2 daughters at bedside.  Disposition Plan: Home.  Consults called: None.  Admission status: Inpatient.    Valentine Kuechle MD FACP. [facility]  If 7PM-7AM, please contact night-coverage [email or web address] Password [facility]  03/08/2017, 11:44 PM

## 2017-03-08 NOTE — ED Provider Notes (Signed)
[facility] Pena DEPT Provider Note   CSN: [id number] Arrival date & time: 03/08/17  2033     History   Chief Complaint Chief Complaint  Patient presents with  . Altered Mental Status    HPI Tanner Pena is a 76 y.o. male.  HPI Patient with some confusion and generalized weakness. Has had a cough brown sputum and wheezing. upon arrival EMS had sats in the 70s. History of COPD. Temperature 101.4 upon arrival. No abdominal pain. No chest pain. Some swelling in his legs. Has chronic umbilical hernia.    Past Medical History:  Diagnosis Date  . Acute blood loss anemia 02/2015 & 05/2015   Hemoglobin 5.5 on 05/31/15; status post transfusion.  . Arthritis   . CAD (coronary artery disease)    a. 05/13/14 [name] s/p overlapping DESx2 to SVG to RCA. b.  s/p CABG '90 with redo '94 & stent to RCA SVG in 2005  . Chronic back pain   . Chronic systolic heart failure ([place])   . Chronic toe ulcer ([place])    a. Left foot  . COPD (chronic obstructive pulmonary disease) ([place])   . Critical lower limb ischemia   . Dysphagia   . Essential hypertension   . GERD (gastroesophageal reflux disease)   . GI bleeding 05/31/2015   Source not identified.  . Gout   . History of kidney stones   . Hypercholesteremia   . Myocardial infarction ([place])   . Neuromuscular disorder ([place])   . Peripheral neuropathy   . Peripheral vascular disease ([place])    Lower extremity PCI/stenting  . S/P aortic valve replacement 1990   a. St. Jude  . Sleep apnea   . Type 2 diabetes mellitus ([place]) 2007    Patient Active Problem List   Diagnosis Date Noted  . GERD (gastroesophageal reflux disease) 11/28/2016  . COPD exacerbation ([place]) 12/06/2015  . Acute on chronic systolic congestive heart failure ([place])   . Frequent PVCs 10/05/2015  . Cardiomyopathy, ischemic 10/05/2015  . AVM (arteriovenous malformation)   . Acute gastric ulcer   . S/P AVR (aortic valve replacement)   . Controlled type 2 diabetes mellitus without  complication ([place])   . Chronic gastric ulcer   . Gastric AVM   . Coronary artery disease involving native coronary artery of native heart without angina pectoris   . AKI (acute kidney injury) ([place]) 10/01/2015  . UGIB (upper gastrointestinal bleed) 09/30/2015  . Chest pain 07/29/2015  . CAD in native artery 07/29/2015  . Gastritis and gastroduodenitis   . Melena 06/20/2015  . GIB (gastrointestinal bleeding) 06/20/2015  . Absolute anemia   . Bleeding gastrointestinal   . History of coronary artery stent placement   . Anticoagulation management encounter   . GI bleed 06/14/2015  . Essential hypertension 06/14/2015  . Iron deficiency anemia due to chronic blood loss   . Diverticulosis 06/01/2015  . Anemia, iron deficiency   . Chronic obstructive pulmonary disease ([place])   . Type 2 diabetes mellitus with stage 1 chronic kidney disease, without long-term current use of insulin ([place])   . GI bleeding 05/31/2015  . 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 Acute kidney injury ([place]) 05/30/2015  . Coronary artery disease involving native coronary artery of native heart with angina pectoris ([place])   . 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 Chronic systolic heart failure ([place]) 03/05/2015  . Mesenteric ischemia, chronic ([place])   . Acute blood loss anemia 03/02/2015  . Elevated INR 03/02/2015  .  Chronic mesenteric ischemia ([place])   . IDA (iron deficiency anemia)   . Chronic anticoagulation   . Enteritis 03/01/2015  . Other noninfectious gastroenteritis   . Abnormal CT of the abdomen 02/17/2015  . Abdominal pain   . Acute respiratory failure with hypoxia ([place])   . Iron deficiency anemia   . Systolic CHF, acute on chronic (HCC)   . NSTEMI (non-ST elevated myocardial infarction) ([place])   . CHF exacerbation ([place]) 02/04/2015  . SOB (shortness of breath)   . Acute respiratory failure ([place]) 12/28/2014  . CAP (community acquired pneumonia) 12/28/2014  . COPD with exacerbation ([place]) 12/28/2014    . Dehiscence of operative wound post left below knee amputation 10/13/2014  . 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 Diabetic peripheral angiopathy ([place]) 09/04/2014  . Below knee amputation status ([place]) 08/20/2014  . 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 Chronic toe ulcer ([place])   . Warfarin-induced coagulopathy ([place]) 05/08/2014  . HTN (hypertension) 05/08/2014  . PVC's (premature ventricular contractions) 05/08/2014  . Unstable angina ([place]) 05/07/2014  . First degree heart block 11/27/2013  . Acute on chronic combined systolic and diastolic CHF 93/81/8299  . COPD (chronic obstructive pulmonary disease) ([place]) 08/11/2013  . LBBB (left bundle branch block) 08/11/2013  . Critical lower limb ischemia 03/04/2013  . CAD - CABG '90 with re do '94  01/05/2013  . Tobacco abuse 01/05/2013  . PVD prior SFA PTA with chronic LE disease, not amenable to PTA 01/05/2013  . DM2 (diabetes mellitus, type 2) ([place]) 01/05/2013  . Accelerated hypertension on admission 01/05/2013  . Carotid stenosis 01/05/2013  . Hyperlipidemia with target LDL less than 70 01/05/2013  . Long term current use of anticoagulant therapy 11/11/2012  . H/O aortic valve replacement- St Jude 11/11/2012  . Colon cancer screening 05/02/2011  . Esophageal dysphagia 05/01/2011  . CLOSED FRACTURE OF METATARSAL BONE 09/17/2010    Past Surgical History:  Procedure Laterality Date  . AMPUTATION Left 07/13/2014   Procedure: Transmetatarsal Amputation;  Surgeon: [name], MD;  Location: [facility];  Service: Orthopedics;  Laterality: Left;  . AMPUTATION Left 08/20/2014   Procedure: Revision Transmetatarsal Amputation versus Below Knee Amputation;  Surgeon: [name], MD;  Location: [facility];  Service: Orthopedics;  Laterality: Left;  . ANGIOPLASTY ILLIAC ARTERY    . AORTIC VALVE REPLACEMENT  1990   St. Jude  . BACK SURGERY  [phone]   2  . BIOPSY N/A 06/20/2015   Procedure: BIOPSY;  Surgeon: Danie Binder, MD;  Location: [facility];  Service: Endoscopy;  Laterality: N/A;  . CATARACT EXTRACTION     bilateral  . COLONOSCOPY  05/2011   Dr. [name]: benign rectal polyp, left sided tics, ascending colonic ulcers (path c/w ischemia)  . COLONOSCOPY WITH PROPOFOL N/A 06/01/2015   RMR: normal appearing rectal mucosa. Scattered left-sided diverticula. the remainder of the colonic mucosa appeared normal. the distal 5 cm of terminal ileal mucosa also appeared normal. retroflexion was performed.   . CORONARY ARTERY BYPASS GRAFT  1994   6 vessels  . CORONARY STENT PLACEMENT  2005   RCA vein graft A 3.0x13.0 TAXUS stent was then placed int he vessel a Viva 3.0x4.0 (perfusion balloon was made ready it was placed through the entire lenght of the stent  . ESOPHAGOGASTRODUODENOSCOPY N/A 02/09/2015   DR. Schooler: Normal EGD  . ESOPHAGOGASTRODUODENOSCOPY  05/2011   Dr. [name]: s/p esophageal dilation, antral erosions/nodularity with benign biopsies  . ESOPHAGOGASTRODUODENOSCOPY  N/A 10/04/2015   [place]: 1 cm gastric cardia ulcer, AVMs in gastric body s/p APC therapy, 4 mm duodenal ulcer in 2nd portion of duodenum  . ESOPHAGOGASTRODUODENOSCOPY (EGD) WITH PROPOFOL N/A 06/20/2015   Procedure: ESOPHAGOGASTRODUODENOSCOPY (EGD) WITH PROPOFOL;  Surgeon: Danie Binder, MD;  Location: [facility];  Service: Endoscopy;  Laterality: N/A;  . EYE SURGERY    . GIVENS CAPSULE STUDY N/A 06/08/2015   Procedure: GIVENS CAPSULE STUDY;  Surgeon: [name], MD;  [facility];  Service: Endoscopy;  Laterality: N/A;  0700   . IR GENERIC HISTORICAL  06/06/2016   IR RADIOLOGIST EVAL & MGMT 06/06/2016 Corrie Mckusick, DO [facility] INTERV RAD  . LEFT HEART CATHETERIZATION WITH CORONARY ANGIOGRAM N/A 05/11/2014   Procedure: LEFT HEART CATHETERIZATION WITH CORONARY ANGIOGRAM;  Surgeon: [name], MD;  Location: [facility] CATH LAB;  Service: Cardiovascular;  Laterality: N/A;  . LOWER EXTREMITY ANGIOGRAM N/A 02/15/2013    Procedure: LOWER EXTREMITY ANGIOGRAM;  Surgeon: [name], MD;  Location: [facility] CATH LAB;  Service: Cardiovascular;  Laterality: N/A;  . LOWER EXTREMITY ANGIOGRAM N/A 06/06/2014   Procedure: LOWER EXTREMITY ANGIOGRAM;  Surgeon: [name], MD;  Location: [facility] CATH LAB;  Service: Cardiovascular;  Laterality: N/A;  . [name]  06/13/2011   Procedure: Venia Minks DILATION;  Surgeon: [name], MD;  Location: [facility];  Service: Endoscopy;  Laterality: N/A;  Dilated to 56.   [name] PERCUTANEOUS CORONARY STENT INTERVENTION (PCI-S) N/A 05/13/2014   Procedure: PERCUTANEOUS CORONARY STENT INTERVENTION (PCI-S);  Surgeon: [name], MD;  Location: [facility] CATH LAB;  Service: Cardiovascular;  Laterality: N/A;  . Peripheral vascular procedures lower extremities     Right external iliac  artery PTA and stenting as well as bilateral SFA intervention remotely. Repeat procedures in 2011 bilaterally  . ROTATOR CUFF REPAIR     right  . STUMP REVISION Left 09/23/2014   Procedure: Revision Left Below Knee Amputation;  Surgeon: [name], MD;  Location: [place];  Service: Orthopedics;  Laterality: Left;  . STUMP REVISION Left 10/13/2014   Procedure: REVISION LEFT BELOW KNEE AMPUTATION STUMP;  Surgeon: [name], MD;  Location: [facility];  Service: Orthopedics;  Laterality: Left;       Home Medications    Prior to Admission medications   Medication Sig Start Date End Date Taking? Authorizing Provider  albuterol (PROVENTIL) 4 MG tablet Take 4 mg by mouth 3 (three) times daily. 01/20/15   [provider]  albuterol-ipratropium (COMBIVENT) 18-103 MCG/ACT inhaler Inhale 1 puff into the lungs as needed. Coughing/ Shortness of Breath    [provider]  allopurinol (ZYLOPRIM) 300 MG tablet Take 300 mg by mouth daily.    [provider]  cholecalciferol (VITAMIN D) 1000 UNITS tablet Take 2,000 Units by mouth daily.    [provider]  dexlansoprazole (DEXILANT)  60 MG capsule Take 1 capsule (60 mg total) by mouth daily. 10/23/16   Annitta Needs, NP  fenofibrate (TRICOR) 145 MG tablet TAKE 1 TABLET BY MOUTH ONCE DAILY FOR CHOLESTEROL. 02/05/17   Troy Sine, MD  ferrous sulfate 325 (65 FE) MG EC tablet Take 1 tablet (325 mg total) by mouth daily with breakfast. 06/02/15   Rexene Alberts, MD  fish oil-omega-3 fatty acids 1000 MG capsule Take 1 capsule (1 g total) by mouth 2 (two) times daily. 01/22/13   Alvstad, Casimiro Needle, RPH-CPP  furosemide (LASIX) 80 MG tablet Take 1 tablet (80 mg total)  by mouth daily. 12/07/15   Kathie Dike, MD  glimepiride (AMARYL) 1 MG tablet Take 1 mg by mouth 2 (two) times daily.    [provider]  Insulin Detemir (LEVEMIR) 100 UNIT/ML Pen Inject 20 Units into the skin daily. Patient taking differently: Inject into the skin daily.  12/07/15   Kathie Dike, MD  Insulin Pen Needle 29G X 12.7MM MISC Use as directed 12/07/15   Kathie Dike, MD  isosorbide mononitrate (IMDUR) 60 MG 24 hr tablet TAKE ONE TABLET BY MOUTH ONCE DAILY. 06/12/16   Troy Sine, MD  metoprolol succinate (TOPROL-XL) 25 MG 24 hr tablet TAKE ONE TABLET BY MOUTH IN THE MORNING AND 1/2 TABLET IN THE EVENING. 06/12/16   Troy Sine, MD  NITROSTAT 0.4 MG SL tablet PLACE 1 TAB UNDER TONGUE EVERY 5 MIN IF NEEDED FOR CHEST PAIN. MAY USE 3 TIMES.NO RELIEF CALL 911. 07/20/15   Eileen Stanford, PA-C  oxyCODONE-acetaminophen (PERCOCET) 10-325 MG per tablet Take 1 tablet by mouth every 4 (four) hours as needed for pain. Patient taking differently: Take 1 tablet by mouth every 3 (three) hours as needed for pain.  09/23/14   [name], MD  potassium chloride SA (K-DUR,KLOR-CON) 20 MEQ tablet Take 20 mEq by mouth daily.      [provider]  pregabalin (LYRICA) 50 MG capsule Take one capsule by mouth twice daily for pains Patient taking differently: Take 50 mg by mouth 3 (three) times daily.  08/23/14   Blanchie Serve, MD  ramipril (ALTACE) 2.5  MG capsule TAKE ONE CAPSULE BY MOUTH DAILY. 02/05/17   Troy Sine, MD  ramipril (ALTACE) 5 MG capsule TAKE 1 CAPSULE BY MOUTH EVERY MORNING. 02/05/17   Troy Sine, MD  rosuvastatin (CRESTOR) 20 MG tablet Take 1 tablet (20 mg total) by mouth daily. CALL FOR APPT FOR FURTHER REFILLS, thanks! 02/10/17   Troy Sine, MD  sitaGLIPtin (JANUVIA) 50 MG tablet Take 50 mg by mouth daily.    [provider]  vitamin C (ASCORBIC ACID) 500 MG tablet Take 500 mg by mouth daily.    [provider]  warfarin (COUMADIN) 5 MG tablet Take 1-1.5 tablets by mouth daily as directed by coumadin clinic 12/23/16   Troy Sine, MD    Family History Family History  Problem Relation Age of Onset  . Diabetes Father   . Heart failure Other   . Colon cancer Neg Hx   . Liver disease Neg Hx     Social History Social History  Substance Use Topics  . Smoking status: Former Smoker    Packs/day: 1.00    Years: 55.00    Types: Cigarettes    Start date: 08/19/1953    Quit date: 05/07/2014  . Smokeless tobacco: Current User    Types: Chew    Last attempt to quit: 08/20/1995     Comment: Has quit on 3 occasions. Counseling given today 5-10 minutes   I am more than likely going to quit "  . Alcohol use No     Comment: Socially, sometimes 12 ounce beer daily, may go month without/no whiskey     Allergies   Patient has no known allergies.   Review of Systems Review of Systems  Constitutional: Positive for appetite change, fatigue and fever.  Eyes: Negative for photophobia and visual disturbance.  Respiratory: Positive for cough, shortness of breath and wheezing.   Gastrointestinal: Negative for abdominal pain.  Endocrine: Negative for polyuria.  Genitourinary: Negative for flank pain.  Musculoskeletal: Negative for back pain.  Neurological: Negative for syncope.  Hematological: Negative for adenopathy.  Psychiatric/Behavioral: Positive for confusion.     Physical Exam Updated  Vital Signs BP (!) 124/105   Pulse 70   Temp (!) 101.4 F (38.6 C) (Oral)   Resp (!) 30   Ht 5' 10.5" (1.791 m)   Wt 104.3 kg (230 lb)   SpO2 98%   BMI 32.54 kg/m   Physical Exam  Constitutional: He appears well-developed.  HENT:  Head: Atraumatic.  Eyes: Pupils are equal, round, and reactive to light.  Neck: Normal range of motion. No JVD present.  Cardiovascular: Normal rate.   Pulmonary/Chest: He is in respiratory distress. He has wheezes. He has rales.  Rales to left lung field.  Abdominal: Soft. There is no tenderness.  Musculoskeletal: He exhibits edema.  edema to bilateral lower extremities.  Neurological: He is alert.  Skin: Skin is warm. Capillary refill takes less than 2 seconds.     ED Treatments / Results  Labs (all labs ordered are listed, but only abnormal results are displayed) Labs Reviewed  COMPREHENSIVE METABOLIC PANEL - Abnormal; Notable for the following:       Result Value   Glucose, Bld 105 (*)    BUN 34 (*)    Creatinine, Ser 1.56 (*)    AST 48 (*)    Alkaline Phosphatase 31 (*)    GFR calc non Af Amer 41 (*)    GFR calc Af Amer 48 (*)    All other components within normal limits  CBC - Abnormal; Notable for the following:    WBC 13.6 (*)    RBC 4.01 (*)    Hemoglobin 12.2 (*)    HCT 37.4 (*)    RDW 16.7 (*)    All other components within normal limits  PROTIME-INR - Abnormal; Notable for the following:    Prothrombin Time 21.8 (*)    All other components within normal limits  LACTIC ACID, PLASMA - Abnormal; Notable for the following:    Lactic Acid, Venous 2.0 (*)    All other components within normal limits  CBG MONITORING, ED - Abnormal; Notable for the following:    Glucose-Capillary 106 (*)    All other components within normal limits  URINALYSIS, ROUTINE W REFLEX MICROSCOPIC  LACTIC ACID, PLASMA    EKG  EKG Interpretation None       Radiology Dg Chest Port 1 View  Result Date: 03/08/2017 CLINICAL DATA:  Cough. EXAM:  PORTABLE CHEST 1 VIEW COMPARISON:  12/06/2015 FINDINGS: The cardio pericardial silhouette is enlarged. Retrocardiac left base collapse/consolidation is suspicious for pneumonia. Right lung appears hyperexpanded but clear. The visualized bony structures of the thorax are intact. Telemetry leads overlie the chest. IMPRESSION: Left base collapse/consolidation suspicious for pneumonia. Follow-up imaging recommended to ensure resolution. Electronically Signed   By: Misty Stanley M.D.   On: 03/08/2017 21:45    Procedures Procedures (including critical care time)  Medications Ordered in ED Medications  azithromycin (ZITHROMAX) 500 mg in dextrose 5 % 250 mL IVPB (0 mg Intravenous Stopped 03/08/17 2236)  sodium chloride 0.9 % bolus 1,000 mL (1,000 mLs Intravenous New Bag/Given 03/08/17 2118)  cefTRIAXone (ROCEPHIN) 1 g in dextrose 5 % 50 mL IVPB (0 g Intravenous Stopped 03/08/17 2204)  ipratropium-albuterol (DUONEB) 0.5-2.5 (3) MG/3ML nebulizer solution 3 mL (3 mLs Nebulization Given 03/08/17 2208)     Initial Impression / Assessment and Plan / ED Course  I have reviewed the triage vital signs and the nursing notes.  Pertinent labs & imaging results that were available during my care of the patient were reviewed by me and considered in my medical decision making (see chart for details).     Patient with confusion. Hypoxia. Found to have pneumonia on x-ray. Febrile. Lactic acid normal. Will admit to internal medicine. Had one episode of hypotension but recheck in a few minutes had normalized up to 268 systolic.  Final Clinical Impressions(s) / ED Diagnoses   Final diagnoses:  Community acquired pneumonia, unspecified laterality    New Prescriptions New Prescriptions   No medications on file     Davonna Belling, MD 03/08/17 2251

## 2017-03-09 DIAGNOSIS — I10 Essential (primary) hypertension: Secondary | ICD-10-CM

## 2017-03-09 DIAGNOSIS — J449 Chronic obstructive pulmonary disease, unspecified: Secondary | ICD-10-CM

## 2017-03-09 DIAGNOSIS — Z952 Presence of prosthetic heart valve: Secondary | ICD-10-CM

## 2017-03-09 LAB — CBC
HCT: 35 % — ABNORMAL LOW (ref 39.0–52.0)
HEMOGLOBIN: 11.3 g/dL — AB (ref 13.0–17.0)
MCH: 30.5 pg (ref 26.0–34.0)
MCHC: 32.3 g/dL (ref 30.0–36.0)
MCV: 94.6 fL (ref 78.0–100.0)
Platelets: 237 10*3/uL (ref 150–400)
RBC: 3.7 MIL/uL — AB (ref 4.22–5.81)
RDW: 16.6 % — ABNORMAL HIGH (ref 11.5–15.5)
WBC: 13.5 10*3/uL — ABNORMAL HIGH (ref 4.0–10.5)

## 2017-03-09 LAB — COMPREHENSIVE METABOLIC PANEL
ALT: 15 U/L — ABNORMAL LOW (ref 17–63)
ANION GAP: 10 (ref 5–15)
AST: 40 U/L (ref 15–41)
Albumin: 3 g/dL — ABNORMAL LOW (ref 3.5–5.0)
Alkaline Phosphatase: 29 U/L — ABNORMAL LOW (ref 38–126)
BUN: 32 mg/dL — ABNORMAL HIGH (ref 6–20)
CALCIUM: 8.9 mg/dL (ref 8.9–10.3)
CHLORIDE: 105 mmol/L (ref 101–111)
CO2: 26 mmol/L (ref 22–32)
Creatinine, Ser: 1.27 mg/dL — ABNORMAL HIGH (ref 0.61–1.24)
GFR calc non Af Amer: 53 mL/min — ABNORMAL LOW (ref >60)
Glucose, Bld: 113 mg/dL — ABNORMAL HIGH (ref 65–99)
Potassium: 3.8 mmol/L (ref 3.5–5.1)
SODIUM: 141 mmol/L (ref 135–145)
Total Bilirubin: 1 mg/dL (ref 0.3–1.2)
Total Protein: 6.2 g/dL — ABNORMAL LOW (ref 6.5–8.1)

## 2017-03-09 LAB — BLOOD GAS, ARTERIAL
Acid-Base Excess: 0.8 mmol/L (ref 0.0–2.0)
Bicarbonate: 25.3 mmol/L (ref 20.0–28.0)
DRAWN BY: 221791
O2 Content: 3 L/min
O2 Saturation: 96.1 %
PH ART: 7.439 (ref 7.350–7.450)
Patient temperature: 37
pCO2 arterial: 36.7 mmHg (ref 32.0–48.0)
pO2, Arterial: 80.4 mmHg — ABNORMAL LOW (ref 83.0–108.0)

## 2017-03-09 LAB — PROTIME-INR
INR: 1.99
Prothrombin Time: 22.9 seconds — ABNORMAL HIGH (ref 11.4–15.2)

## 2017-03-09 LAB — MRSA PCR SCREENING: MRSA BY PCR: NEGATIVE

## 2017-03-09 LAB — TROPONIN I
TROPONIN I: 0.06 ng/mL — AB (ref <0.03)
Troponin I: 0.06 ng/mL (ref <0.03)

## 2017-03-09 LAB — GLUCOSE, CAPILLARY
GLUCOSE-CAPILLARY: 112 mg/dL — AB (ref 65–99)
GLUCOSE-CAPILLARY: 132 mg/dL — AB (ref 65–99)
GLUCOSE-CAPILLARY: 201 mg/dL — AB (ref 65–99)
Glucose-Capillary: 107 mg/dL — ABNORMAL HIGH (ref 65–99)
Glucose-Capillary: 206 mg/dL — ABNORMAL HIGH (ref 65–99)

## 2017-03-09 LAB — MAGNESIUM: MAGNESIUM: 2.1 mg/dL (ref 1.7–2.4)

## 2017-03-09 LAB — LACTIC ACID, PLASMA: Lactic Acid, Venous: 1.2 mmol/L (ref 0.5–1.9)

## 2017-03-09 MED ORDER — DEXTROSE 5 % IV SOLN: 500.0000 mg | INTRAVENOUS | Status: DC

## 2017-03-09 MED ORDER — IPRATROPIUM BROMIDE 0.02 % IN SOLN
0.5000 mg | Freq: Four times a day (QID) | RESPIRATORY_TRACT | Status: DC
  Administered 2017-03-09: 0.5 mg via RESPIRATORY_TRACT
  Filled 2017-03-09: qty 2.5

## 2017-03-09 MED ORDER — INSULIN ASPART 100 UNIT/ML ~~LOC~~ SOLN
0.0000 [IU] | Freq: Three times a day (TID) | SUBCUTANEOUS | Status: DC
  Administered 2017-03-09: 2 [IU] via SUBCUTANEOUS
  Administered 2017-03-09: 5 [IU] via SUBCUTANEOUS
  Administered 2017-03-10 (×2): 3 [IU] via SUBCUTANEOUS
  Administered 2017-03-10: 5 [IU] via SUBCUTANEOUS
  Administered 2017-03-11: 3 [IU] via SUBCUTANEOUS
  Administered 2017-03-11 (×2): 5 [IU] via SUBCUTANEOUS
  Administered 2017-03-12: 8 [IU] via SUBCUTANEOUS
  Administered 2017-03-12 (×2): 5 [IU] via SUBCUTANEOUS

## 2017-03-09 MED ORDER — WARFARIN - PHARMACIST DOSING INPATIENT: Freq: Every day | Status: DC

## 2017-03-09 MED ORDER — VITAMIN C 500 MG PO TABS
500.0000 mg | ORAL_TABLET | Freq: Every day | ORAL | Status: DC
  Administered 2017-03-09 – 2017-03-12 (×4): 500 mg via ORAL
  Filled 2017-03-09 (×5): qty 1

## 2017-03-09 MED ORDER — OXYCODONE-ACETAMINOPHEN 10-325 MG PO TABS: 1.0000 | ORAL_TABLET | ORAL | Status: DC | PRN

## 2017-03-09 MED ORDER — ALLOPURINOL 300 MG PO TABS
300.0000 mg | ORAL_TABLET | Freq: Every day | ORAL | Status: DC
  Administered 2017-03-09 – 2017-03-12 (×4): 300 mg via ORAL
  Filled 2017-03-09 (×4): qty 1

## 2017-03-09 MED ORDER — CEFTRIAXONE SODIUM 1 G IJ SOLR
1.0000 g | INTRAMUSCULAR | Status: DC
  Administered 2017-03-09 – 2017-03-11 (×3): 1 g via INTRAVENOUS
  Filled 2017-03-09 (×5): qty 10

## 2017-03-09 MED ORDER — ACETAMINOPHEN 650 MG RE SUPP: 650.0000 mg | Freq: Four times a day (QID) | RECTAL | Status: DC | PRN

## 2017-03-09 MED ORDER — IPRATROPIUM-ALBUTEROL 0.5-2.5 (3) MG/3ML IN SOLN: 3.0000 mL | Freq: Four times a day (QID) | RESPIRATORY_TRACT | Status: DC | PRN

## 2017-03-09 MED ORDER — WARFARIN SODIUM 2.5 MG PO TABS
2.5000 mg | ORAL_TABLET | Freq: Once | ORAL | Status: AC
  Administered 2017-03-09: 2.5 mg via ORAL
  Filled 2017-03-09: qty 1

## 2017-03-09 MED ORDER — ISOSORBIDE MONONITRATE ER 60 MG PO TB24
60.0000 mg | ORAL_TABLET | Freq: Every day | ORAL | Status: DC
  Administered 2017-03-09 – 2017-03-10 (×2): 60 mg via ORAL
  Filled 2017-03-09 (×2): qty 1

## 2017-03-09 MED ORDER — OXYCODONE HCL 5 MG PO TABS: 5.0000 mg | ORAL_TABLET | ORAL | Status: DC | PRN

## 2017-03-09 MED ORDER — PANTOPRAZOLE SODIUM 40 MG PO TBEC
40.0000 mg | DELAYED_RELEASE_TABLET | Freq: Every day | ORAL | Status: DC
  Administered 2017-03-09 – 2017-03-12 (×4): 40 mg via ORAL
  Filled 2017-03-09 (×4): qty 1

## 2017-03-09 MED ORDER — OXYCODONE-ACETAMINOPHEN 5-325 MG PO TABS: 1.0000 | ORAL_TABLET | ORAL | Status: DC | PRN

## 2017-03-09 MED ORDER — ROSUVASTATIN CALCIUM 20 MG PO TABS
20.0000 mg | ORAL_TABLET | Freq: Every day | ORAL | Status: DC
  Administered 2017-03-09 – 2017-03-12 (×4): 20 mg via ORAL
  Filled 2017-03-09 (×4): qty 1

## 2017-03-09 MED ORDER — METOPROLOL TARTRATE 25 MG PO TABS
12.5000 mg | ORAL_TABLET | Freq: Two times a day (BID) | ORAL | Status: DC
  Administered 2017-03-09 – 2017-03-10 (×4): 12.5 mg via ORAL
  Filled 2017-03-09 (×4): qty 1

## 2017-03-09 MED ORDER — IPRATROPIUM-ALBUTEROL 0.5-2.5 (3) MG/3ML IN SOLN
3.0000 mL | Freq: Two times a day (BID) | RESPIRATORY_TRACT | Status: DC
  Administered 2017-03-09: 3 mL via RESPIRATORY_TRACT
  Filled 2017-03-09: qty 3

## 2017-03-09 MED ORDER — ALBUTEROL SULFATE ER 4 MG PO TB12
4.0000 mg | ORAL_TABLET | Freq: Two times a day (BID) | ORAL | Status: DC
  Administered 2017-03-09: 4 mg via ORAL
  Filled 2017-03-09 (×3): qty 1

## 2017-03-09 MED ORDER — METHYLPREDNISOLONE SODIUM SUCC 40 MG IJ SOLR
40.0000 mg | Freq: Two times a day (BID) | INTRAMUSCULAR | Status: DC
  Administered 2017-03-09 – 2017-03-12 (×7): 40 mg via INTRAVENOUS
  Filled 2017-03-09 (×8): qty 1

## 2017-03-09 MED ORDER — IPRATROPIUM-ALBUTEROL 0.5-2.5 (3) MG/3ML IN SOLN
3.0000 mL | Freq: Four times a day (QID) | RESPIRATORY_TRACT | Status: DC
  Administered 2017-03-10 – 2017-03-12 (×11): 3 mL via RESPIRATORY_TRACT
  Filled 2017-03-09 (×12): qty 3

## 2017-03-09 MED ORDER — GUAIFENESIN ER 600 MG PO TB12
1200.0000 mg | ORAL_TABLET | Freq: Two times a day (BID) | ORAL | Status: DC
  Administered 2017-03-09 – 2017-03-12 (×7): 1200 mg via ORAL
  Filled 2017-03-09 (×7): qty 2

## 2017-03-09 MED ORDER — ACETAMINOPHEN 325 MG PO TABS: 650.0000 mg | ORAL_TABLET | Freq: Four times a day (QID) | ORAL | Status: DC | PRN

## 2017-03-09 MED ORDER — INSULIN ASPART 100 UNIT/ML ~~LOC~~ SOLN
0.0000 [IU] | Freq: Every day | SUBCUTANEOUS | Status: DC
  Administered 2017-03-09 – 2017-03-11 (×2): 2 [IU] via SUBCUTANEOUS

## 2017-03-09 MED ORDER — FENOFIBRATE 160 MG PO TABS
160.0000 mg | ORAL_TABLET | Freq: Every day | ORAL | Status: DC
  Administered 2017-03-09 – 2017-03-12 (×4): 160 mg via ORAL
  Filled 2017-03-09 (×5): qty 1

## 2017-03-09 MED ORDER — PREGABALIN 50 MG PO CAPS
50.0000 mg | ORAL_CAPSULE | Freq: Three times a day (TID) | ORAL | Status: DC
Start: 2017-03-09 — End: 2017-03-12
  Administered 2017-03-09 – 2017-03-12 (×11): 50 mg via ORAL
  Filled 2017-03-09: qty 2
  Filled 2017-03-09 (×3): qty 1
  Filled 2017-03-09 (×7): qty 2

## 2017-03-09 MED ORDER — ALBUTEROL SULFATE 4 MG PO TABS
4.0000 mg | ORAL_TABLET | Freq: Two times a day (BID) | ORAL | Status: DC
  Filled 2017-03-09 (×5): qty 1

## 2017-03-09 NOTE — Consult Note (Signed)
Consult requested by: [facility] Consult requested for: Respiratory failure  HPI: This is a 76 year old with known COPD mechanical aortic valve replacement peripheral arterial disease, coronary artery disease, hypertension, congestive heart failure, diabetes and he says he is on home oxygen. He says for the last week or so he's been very weak he's been coughing and wheezing short of breath had fever at home and was coughing up copious amounts of sputum. He is known to have COPD at baseline. When he presented to the emergency room he was hypotensive and had an elevated lactate. Chest x-ray which I personally reviewed shows left lower lobe infiltrate which is pretty dense. His lactate level has improved. His blood pressure is better. However he is still short of breath at rest and still seems mildly confused. He says he doesn't have any chest pain now. He's not coughing as much up. He's not having any abdominal pain nausea or vomiting. He doesn't have a good appetite. He says he has a headache he thinks from coughing. He has a sore throat from coughing.  Past Medical History:  Diagnosis Date  . Acute blood loss anemia 02/2015 & 05/2015   Hemoglobin 5.5 on 05/31/15; status post transfusion.  . Arthritis   . CAD (coronary artery disease)    a. 05/13/14 [name] s/p overlapping DESx2 to SVG to RCA. b.  s/p CABG '90 with redo '94 & stent to RCA SVG in 2005  . Chronic back pain   . Chronic systolic heart failure ([place])   . Chronic toe ulcer ([place])    a. Left foot  . COPD (chronic obstructive pulmonary disease) ([place])   . Critical lower limb ischemia   . Dysphagia   . Essential hypertension   . GERD (gastroesophageal reflux disease)   . GI bleeding 05/31/2015   Source not identified.  . Gout   . History of kidney stones   . Hypercholesteremia   . Myocardial infarction ([place])   . Neuromuscular disorder ([place])   . Peripheral neuropathy   . Peripheral vascular disease ([place])    Lower extremity  PCI/stenting  . S/P aortic valve replacement 1990   a. [employer]  . Sleep apnea   . Type 2 diabetes mellitus ([place]) 2007     Family History  Problem Relation Age of Onset  . Diabetes Father   . Heart failure Other   . Colon cancer Neg Hx   . Liver disease Neg Hx     He says there is a positive family history of COPD Social History   Social History  . Marital status: Legally Separated    Spouse name: N/A  . Number of children: 5  . Years of education: N/A   Occupational History  . 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 Smoking status: Former Smoker    Packs/day: 1.00    Years: 55.00    Types: Cigarettes    Start date: 08/19/1953    Quit date: 05/07/2014  . Smokeless tobacco: Current User    Types: Chew    Last attempt to quit: 08/20/1995     Comment: Has quit on 3 occasions. Counseling given today 5-10 minutes   I am more than likely going to quit "  . Alcohol use No     Comment: Socially, sometimes 12 ounce beer daily, may go month without/no whiskey  . Drug use: No  . Sexual activity: Not Currently   Other Topics Concern  . None  Social History Narrative   Has 3 daughters   Has 2 sons     ROS: Except as mentioned 10 point review of systems is negative    Objective: Vital signs in last 24 hours: Temp:  [98 F (36.7 C)-101.4 F (38.6 C)] 98 F (36.7 C) (07/22 0802) Pulse Rate:  [70-103] 92 (07/22 0802) Resp:  [21-32] 27 (07/22 0802) BP: (76-124)/(38-105) 102/90 (07/22 0802) SpO2:  [86 %-98 %] 96 % (07/22 0802) FiO2 (%):  [28 %] 28 % (07/22 0154) Weight:  [94.8 kg (208 lb 15.9 oz)-104.3 kg (230 lb)] 95.1 kg (209 lb 10.5 oz) (07/22 0500) Weight change:  Last BM Date: 03/08/17  Intake/Output from previous day: 07/21 0701 - 07/22 0700 In: 1300 [I.V.:300; IV Piggyback:1000] Out: 400 [Urine:400]  PHYSICAL EXAM Constitutional: He is awake and alert mildly confused. His respiratory effort is increased. Eyes: Pupils react. Ears  nose mouth and throat: His mucous membranes are moist. His throat is clear. Cardiovascular: His heart is regular with mild tachycardia. Respiratory: Respiratory effort and rate or increased. He has rales in the base of his left lung. Mild to moderate respiratory distress. Gastrointestinal: His abdomen is soft with no masses. Skin: Poor skin turgor. Musculoskeletal: He is weak in all extremities. Neurological: He is still mildly confused. Psychiatric:  unable to assess  Lab Results: Basic Metabolic Panel:  Recent Labs  03/08/17 2053 03/09/17 0454  NA 142 141  K 4.1 3.8  CL 106 105  CO2 25 26  GLUCOSE 105* 113*  BUN 34* 32*  CREATININE 1.56* 1.27*  CALCIUM 9.5 8.9   Liver Function Tests:  Recent Labs  03/08/17 2053 03/09/17 0454  AST 48* 40  ALT 18 15*  ALKPHOS 31* 29*  BILITOT 1.1 1.0  PROT 7.1 6.2*  ALBUMIN 3.5 3.0*   No results for input(s): LIPASE, AMYLASE in the last 72 hours. No results for input(s): AMMONIA in the last 72 hours. CBC:  Recent Labs  03/08/17 2053 03/09/17 0454  WBC 13.6* 13.5*  HGB 12.2* 11.3*  HCT 37.4* 35.0*  MCV 93.3 94.6  PLT 260 237   Cardiac Enzymes: No results for input(s): CKTOTAL, CKMB, CKMBINDEX, TROPONINI in the last 72 hours. BNP: No results for input(s): PROBNP in the last 72 hours. D-Dimer: No results for input(s): DDIMER in the last 72 hours. CBG:  Recent Labs  03/08/17 2110 03/09/17 0236 03/09/17 0801  GLUCAP 106* 112* 107*   Hemoglobin A1C: No results for input(s): HGBA1C in the last 72 hours. Fasting Lipid Panel: No results for input(s): CHOL, HDL, LDLCALC, TRIG, CHOLHDL, LDLDIRECT in the last 72 hours. Thyroid Function Tests: No results for input(s): TSH, T4TOTAL, FREET4, T3FREE, THYROIDAB in the last 72 hours. Anemia Panel: No results for input(s): VITAMINB12, FOLATE, FERRITIN, TIBC, IRON, RETICCTPCT in the last 72 hours. Coagulation:  Recent Labs  03/08/17 2053 03/09/17 0454  LABPROT 21.8* 22.9*  INR  1.87 1.99   Urine Drug Screen: Drugs of Abuse  No results found for: LABOPIA, COCAINSCRNUR, LABBENZ, AMPHETMU, THCU, LABBARB  Alcohol Level: No results for input(s): ETH in the last 72 hours. Urinalysis:  Recent Labs  03/08/17 2140  [place]  LABSPEC 1.013  PHURINE 5.0  GLUCOSEU NEGATIVE  HGBUR NEGATIVE  BILIRUBINUR NEGATIVE  KETONESUR NEGATIVE  PROTEINUR NEGATIVE  NITRITE NEGATIVE  LEUKOCYTESUR NEGATIVE   Misc. Labs:   ABGS: No results for input(s): PHART, PO2ART, TCO2, HCO3 in the last 72 hours.  Invalid input(s): PCO2   MICROBIOLOGY: Recent Results (from  the past 240 hour(s))  MRSA PCR Screening     Status: None   Collection Time: 03/09/17  2:05 AM  Result Value Ref Range Status   MRSA by PCR NEGATIVE NEGATIVE Final    Comment:        The GeneXpert MRSA Assay (FDA approved for NASAL specimens only), is one component of a comprehensive MRSA colonization surveillance program. It is not intended to diagnose MRSA infection nor to guide or monitor treatment for MRSA infections.     Studies/Results: Dg Chest Port 1 View  Result Date: 03/08/2017 CLINICAL DATA:  Cough. EXAM: PORTABLE CHEST 1 VIEW COMPARISON:  12/06/2015 FINDINGS: The cardio pericardial silhouette is enlarged. Retrocardiac left base collapse/consolidation is suspicious for pneumonia. Right lung appears hyperexpanded but clear. The visualized bony structures of the thorax are intact. Telemetry leads overlie the chest. IMPRESSION: Left base collapse/consolidation suspicious for pneumonia. Follow-up imaging recommended to ensure resolution. Electronically Signed   By: Misty Stanley M.D.   On: 03/08/2017 21:45    Medications:  Prior to Admission:  Prescriptions Prior to Admission  Medication Sig Dispense Refill Last Dose  . albuterol (PROVENTIL) 4 MG tablet Take 4 mg by mouth 2 (two) times daily.    03/08/2017 at Unknown time  . allopurinol (ZYLOPRIM) 300 MG tablet Take 300 mg by mouth  daily.   03/08/2017 at Unknown time  . amoxicillin-clavulanate (AUGMENTIN) 875-125 MG tablet Take 1 tablet by mouth 2 (two) times daily.   Past Week at Unknown time  . cholecalciferol (VITAMIN D) 1000 UNITS tablet Take 2,000 Units by mouth daily.   03/08/2017 at Unknown time  . COMBIVENT RESPIMAT 20-100 MCG/ACT AERS respimat Inhale 1 puff into the lungs every 6 (six) hours as needed for wheezing or shortness of breath.    Past Week at Unknown time  . dexlansoprazole (DEXILANT) 60 MG capsule Take 1 capsule (60 mg total) by mouth daily. 90 capsule 3 03/08/2017 at Unknown time  . fenofibrate (TRICOR) 145 MG tablet TAKE 1 TABLET BY MOUTH ONCE DAILY FOR CHOLESTEROL. 30 tablet 0 03/08/2017 at Unknown time  . fish oil-omega-3 fatty acids 1000 MG capsule Take 1 capsule (1 g total) by mouth 2 (two) times daily.   03/08/2017 at Unknown time  . furosemide (LASIX) 80 MG tablet Take 1 tablet (80 mg total) by mouth daily. 30 tablet 0 03/08/2017 at Unknown time  . glimepiride (AMARYL) 1 MG tablet Take 2 mg by mouth daily.    03/08/2017 at Unknown time  . Insulin Detemir (LEVEMIR) 100 UNIT/ML Pen Inject 20 Units into the skin daily. (Patient taking differently: Inject 30-40 Units into the skin 2 (two) times daily. ) 15 mL 11 03/08/2017 at Unknown time  . isosorbide mononitrate (IMDUR) 60 MG 24 hr tablet TAKE ONE TABLET BY MOUTH ONCE DAILY. 90 tablet 3 03/08/2017 at Unknown time  . metoprolol succinate (TOPROL-XL) 25 MG 24 hr tablet TAKE ONE TABLET BY MOUTH IN THE MORNING AND 1/2 TABLET IN THE EVENING. 45 tablet 8 03/08/2017 at Unknown time  . NITROSTAT 0.4 MG SL tablet PLACE 1 TAB UNDER TONGUE EVERY 5 MIN IF NEEDED FOR CHEST PAIN. MAY USE 3 TIMES.NO RELIEF CALL 911. 25 tablet 4 03/07/2017 at Unknown time  . oxyCODONE-acetaminophen (PERCOCET) 10-325 MG per tablet Take 1 tablet by mouth every 4 (four) hours as needed for pain. (Patient taking differently: Take 1 tablet by mouth every 3 (three) hours as needed for pain. ) 30 tablet  0 03/08/2017 at Unknown time  .  potassium chloride SA (K-DUR,KLOR-CON) 20 MEQ tablet Take 20 mEq by mouth daily.     03/08/2017 at Unknown time  . pregabalin (LYRICA) 50 MG capsule Take one capsule by mouth twice daily for pains (Patient taking differently: Take 50 mg by mouth 3 (three) times daily. ) 60 capsule 5 03/08/2017 at Unknown time  . ramipril (ALTACE) 5 MG capsule TAKE 1 CAPSULE BY MOUTH EVERY MORNING. 30 capsule 0 03/08/2017 at Unknown time  . rosuvastatin (CRESTOR) 20 MG tablet Take 1 tablet (20 mg total) by mouth daily. CALL FOR APPT FOR FURTHER REFILLS, thanks! 15 tablet 0 03/08/2017 at Unknown time  . sitaGLIPtin (JANUVIA) 50 MG tablet Take 50 mg by mouth daily.   03/08/2017 at Unknown time  . vitamin C (ASCORBIC ACID) 500 MG tablet Take 500 mg by mouth daily.   03/08/2017 at Unknown time  . warfarin (COUMADIN) 5 MG tablet Take 1-1.5 tablets by mouth daily as directed by coumadin clinic (Patient taking differently: Take 2.5-5 mg by mouth See admin instructions. Take 5mg  on Mondays and Fridays. Take 2.5mg  on all other days) 40 tablet 1 03/08/2017 at Unknown time  . Insulin Pen Needle 29G X 12.7MM MISC Use as directed 30 each 0 Taking   Scheduled: . albuterol  4 mg Oral BID  . allopurinol  300 mg Oral Daily  . fenofibrate  160 mg Oral Daily  . guaiFENesin  1,200 mg Oral BID  . insulin aspart  0-15 Units Subcutaneous TID WC  . insulin aspart  0-5 Units Subcutaneous QHS  . ipratropium-albuterol  3 mL Nebulization BID  . isosorbide mononitrate  60 mg Oral Daily  . methylPREDNISolone (SOLU-MEDROL) injection  40 mg Intravenous Q12H  . pantoprazole  40 mg Oral Daily  . pregabalin  50 mg Oral TID  . rosuvastatin  20 mg Oral Daily  . vitamin C  500 mg Oral Daily  . Warfarin - Pharmacist Dosing Inpatient   Does not apply q1800   Continuous: . azithromycin Stopped (03/08/17 2236)  . cefTRIAXone (ROCEPHIN)  IV     UXN:ATFTDDUKGURKY **OR** acetaminophen, ipratropium-albuterol,  oxyCODONE-acetaminophen **AND** oxyCODONE  Assesment: He was admitted with pneumonia and he was septic on admission. He has COPD at baseline. He told me he wears oxygen at home but that's not mentioned anywhere else so I'm not sure if that's correct or not. He has at least acute hypoxic respiratory failure and it may be acute on chronic based on that history. He has continued respiratory distress and increased respiratory rate. I don't think he needs intubation at this point but he is high risk for deterioration and requiring intubation and mechanical ventilation which she agrees to do if necessary. He has coronary disease and has had a heart attack but we don't have any evidence of acute coronary syndrome now. He has had mechanical aortic valve replacement and is chronically anticoagulated and has a history of GI bleeding. He is being given prophylactic treatment for that. He has a history of congestive heart failure so it makes it more difficult as far as fluid resuscitation for his sepsis. Active Problems:   Long term current use of anticoagulant therapy   DM2 (diabetes mellitus, type 2) (H[facility])   COPD (chronic obstructive pulmonary disease) ([facility])   LBBB (left bundle branch block)   HTN (hypertension)   Status post mechanical aortic valve replacement   S/P AVR (aortic valve replacement)   Sepsis ([place])    Plan: Continue current treatments. He is apparently improving although  he is still high risk for needing ventilator support. Repeat chest x-ray tomorrow. Add moderate dose steroids. Add flutter valve and Mucinex.    LOS: 1 day   Jaclyn Andy L 03/09/2017, 9:26 AM

## 2017-03-09 NOTE — Progress Notes (Signed)
[facility] PROGRESS NOTE  Tanner Pena VEH:[id number] DOB: 04-16-1941 DOA: 03/08/2017 PCP: Redmond School, MD  Brief summary   76 y.o. male with hx of COPD, CHF, mechanical AVR, PVD, CAD s/p CAD, hx of HTN, GI Bleed, CHF, HLD, DM2, presented to the ER with feeling weakness, confused, fever to 101.4, coughs, wheezing and SOB.  In the ER he was found to have very soft BP, and was given IVF.  His sensorium and delirium improved, and he was able to converse meaningfully.  Evaluation in the ER showed CXR with PNA, and Cr of 1.5, WBC of 13K.  His INR was 1.8.  Lactic acid was 2.0, and UA showed No evidence of infection.  Hospitalist was asked to admit him for CAP.    Assessment/Plan:  CAP. CXR- Left base collapse/consolidation. Remains in respiratory distress. Close monitor SDU. hypotensive on admission, lactic acidosis: resolved with gentle IVF.   -cont iv antibiotic treatment, no blood cultures were drawn on admission. Will use NiPPV as needed. He is tachypnea at risk for res failure, will consult pulmonology eval    HTN:  Holding ALL anti HTN meds due to soft Blood pressure. Will restart if needed  CHF. Chronic systolic heart failure. clinically euvolemic. Holding BB, Lasix due to soft BP. Monitor I/o, daily weight. Will restart meds as needed  COPD: He has minimal wheezing.  Will continue bronchodilators, treat pneumonia. NiPPV as needed.    Mechanical AVR:  Will continue with coumadin per pharmacy dosing.  If it does get to therapeutic tomorrow, should bridge it.   DM: monitor on ISS while poor appetite. Restart lantus later if able to take PO   Transient confusion: delirium likely from fever, hypoxia, or pain meds. Neuro exam is non focal. improving   Prognosis is guarded with pneumonia, risk of organ dysfunction. Close monitor   Code Status: full Family Communication: d/w patient, called his friend updated. Called his children, now answer. Will try again (indicate person  spoken with, relationship, and if by phone, the number) Disposition Plan: pend clinical improvement    Consultants:  Pulmonology   Procedures:  none  Antibiotics: Anti-infectives    Start     Dose/Rate Route Frequency Ordered Stop   03/09/17 2100  cefTRIAXone (ROCEPHIN) 1 g in dextrose 5 % 50 mL IVPB     1 g 100 mL/hr over 30 Minutes Intravenous Every 24 hours 03/09/17 0154     03/09/17 0153  azithromycin (ZITHROMAX) 500 mg in dextrose 5 % 250 mL IVPB  Status:  Discontinued     500 mg 250 mL/hr over 60 Minutes Intravenous Every 24 hours 03/09/17 0154 03/09/17 0232   03/08/17 2100  cefTRIAXone (ROCEPHIN) 1 g in dextrose 5 % 50 mL IVPB     1 g 100 mL/hr over 30 Minutes Intravenous  Once 03/08/17 2058 03/08/17 2204   03/08/17 2100  azithromycin (ZITHROMAX) 500 mg in dextrose 5 % 250 mL IVPB     500 mg 250 mL/hr over 60 Minutes Intravenous Every 24 hours 03/08/17 2058          (indicate start date, and stop date if known)  HPI/Subjective: Mild tachypnea, cough. Denies acute chest pains. Febrile last night. BP is improving.   Objective: Vitals:   03/09/17 0700 03/09/17 0802  BP: (!) 120/53 102/90  Pulse: 88 92  Resp: (!) 32 (!) 27  Temp:  98 F (36.7 C)    Intake/Output Summary (Last 24 hours) at 03/09/17 8366 Last data filed  at 03/09/17 0118  Gross per 24 hour  Intake             1300 ml  Output              400 ml  Net              900 ml   Filed Weights   03/08/17 2040 03/09/17 0130 03/09/17 0500  Weight: 104.3 kg (230 lb) 94.8 kg (208 lb 15.9 oz) 95.1 kg (209 lb 10.5 oz)    Exam:   General:  Mild distress   Cardiovascular: s1,s2 rrr  Respiratory: rales LL  Abdomen: soft, nt   Musculoskeletal: no right LE edema    Data Reviewed: Basic Metabolic Panel:  Recent Labs Lab 03/08/17 2053 03/09/17 0454  NA 142 141  K 4.1 3.8  CL 106 105  CO2 25 26  GLUCOSE 105* 113*  BUN 34* 32*  CREATININE 1.56* 1.27*  CALCIUM 9.5 8.9   Liver Function  Tests:  Recent Labs Lab 03/08/17 2053 03/09/17 0454  AST 48* 40  ALT 18 15*  ALKPHOS 31* 29*  BILITOT 1.1 1.0  PROT 7.1 6.2*  ALBUMIN 3.5 3.0*   No results for input(s): LIPASE, AMYLASE in the last 168 hours. No results for input(s): AMMONIA in the last 168 hours. CBC:  Recent Labs Lab 03/08/17 2053 03/09/17 0454  WBC 13.6* 13.5*  HGB 12.2* 11.3*  HCT 37.4* 35.0*  MCV 93.3 94.6  PLT 260 237   Cardiac Enzymes: No results for input(s): CKTOTAL, CKMB, CKMBINDEX, TROPONINI in the last 168 hours. BNP (last 3 results) No results for input(s): BNP in the last 8760 hours.  ProBNP (last 3 results) No results for input(s): PROBNP in the last 8760 hours.  CBG:  Recent Labs Lab 03/08/17 2110 03/09/17 0236 03/09/17 0801  GLUCAP 106* 112* 107*    No results found for this or any previous visit (from the past 240 hour(s)).   Studies: Dg Chest Port 1 View  Result Date: 03/08/2017 CLINICAL DATA:  Cough. EXAM: PORTABLE CHEST 1 VIEW COMPARISON:  12/06/2015 FINDINGS: The cardio pericardial silhouette is enlarged. Retrocardiac left base collapse/consolidation is suspicious for pneumonia. Right lung appears hyperexpanded but clear. The visualized bony structures of the thorax are intact. Telemetry leads overlie the chest. IMPRESSION: Left base collapse/consolidation suspicious for pneumonia. Follow-up imaging recommended to ensure resolution. Electronically Signed   By: Misty Stanley M.D.   On: 03/08/2017 21:45    Scheduled Meds: . albuterol  4 mg Oral BID  . allopurinol  300 mg Oral Daily  . fenofibrate  160 mg Oral Daily  . insulin aspart  0-15 Units Subcutaneous TID WC  . insulin aspart  0-5 Units Subcutaneous QHS  . ipratropium-albuterol  3 mL Nebulization BID  . isosorbide mononitrate  60 mg Oral Daily  . pantoprazole  40 mg Oral Daily  . pregabalin  50 mg Oral TID  . rosuvastatin  20 mg Oral Daily  . vitamin C  500 mg Oral Daily  . Warfarin - Pharmacist Dosing  Inpatient   Does not apply q1800   Continuous Infusions: . azithromycin Stopped (03/08/17 2236)  . cefTRIAXone (ROCEPHIN)  IV      Active Problems:   Long term current use of anticoagulant therapy   DM2 (diabetes mellitus, type 2) (HCC)   COPD (chronic obstructive pulmonary disease) (HCC)   LBBB (left bundle branch block)   HTN (hypertension)   Status post mechanical aortic valve replacement  S/P AVR (aortic valve replacement)   Sepsis ([place])    Time spent: >35 minutes     Kinnie Feil  [facility] Pager [phone]. If 7PM-7AM, please contact night-coverage at [email or web address], password [facility] 03/09/2017, 8:08 AM  LOS: 1 day

## 2017-03-09 NOTE — Progress Notes (Signed)
eLink Physician-Brief Progress Note Patient Name: ABDULAHI SCHOR DOB: 1941/04/19 MRN: [id number]   Date of Service  03/09/2017  HPI/Events of Note  arrythmia  eICU Interventions  Try off albuterol an use xopenex 0.625mg  qid instead      Intervention Category Major Interventions: Arrhythmia - evaluation and management  Christinia Gully 03/09/2017, 4:51 PM

## 2017-03-09 NOTE — ED Notes (Signed)
Assisted patient with urinal

## 2017-03-09 NOTE — Progress Notes (Addendum)
ANTICOAGULATION CONSULT NOTE - Preliminary  Pharmacy Consult for warfarin Indication: mechanical valve  No Known Allergies  Patient Measurements: Height: 5\' 11"  (180.3 cm) Weight: 209 lb 10.5 oz (95.1 kg) IBW/kg (Calculated) : 75.3 HEPARIN DW (KG): 94.3   Vital Signs: Temp: 98.8 F (37.1 C) (07/22 0400) Temp Source: Oral (07/22 0400) BP: 113/52 (07/22 0330) Pulse Rate: 89 (07/22 0330)  Labs:  Recent Labs  03/08/17 2053 03/09/17 0454  HGB 12.2* 11.3*  HCT 37.4* 35.0*  PLT 260 237  LABPROT 21.8* 22.9*  INR 1.87 1.99  CREATININE 1.56*  --    Estimated Creatinine Clearance: 47.4 mL/min (A) (by C-G formula based on SCr of 1.56 mg/dL (H)).  Medical History: Past Medical History:  Diagnosis Date  . Acute blood loss anemia 02/2015 & 05/2015   Hemoglobin 5.5 on 05/31/15; status post transfusion.  . Arthritis   . CAD (coronary artery disease)    a. 05/13/14 [name] s/p overlapping DESx2 to SVG to RCA. b.  s/p CABG '90 with redo '94 & stent to RCA SVG in 2005  . Chronic back pain   . Chronic systolic heart failure ([place])   . Chronic toe ulcer ([place])    a. Left foot  . COPD (chronic obstructive pulmonary disease) ([place])   . Critical lower limb ischemia   . Dysphagia   . Essential hypertension   . GERD (gastroesophageal reflux disease)   . GI bleeding 05/31/2015   Source not identified.  . Gout   . History of kidney stones   . Hypercholesteremia   . Myocardial infarction ([place])   . Neuromuscular disorder ([place])   . Peripheral neuropathy   . Peripheral vascular disease ([place])    Lower extremity PCI/stenting  . S/P aortic valve replacement 1990   a. [employer]  . Sleep apnea   . Type 2 diabetes mellitus ([place]) 2007    Medications:  Scheduled:  . albuterol  4 mg Oral BID  . allopurinol  300 mg Oral Daily  . fenofibrate  160 mg Oral Daily  . insulin aspart  0-15 Units Subcutaneous TID WC  . insulin aspart  0-5 Units Subcutaneous QHS  . ipratropium-albuterol  3 mL  Nebulization BID  . isosorbide mononitrate  60 mg Oral Daily  . pantoprazole  40 mg Oral Daily  . pregabalin  50 mg Oral TID  . rosuvastatin  20 mg Oral Daily  . vitamin C  500 mg Oral Daily  . warfarin  2.5 mg Oral Once  . Warfarin - Pharmacist Dosing Inpatient   Does not apply q1800   Infusions:  . azithromycin Stopped (03/08/17 2236)  . cefTRIAXone (ROCEPHIN)  IV     PRN: acetaminophen **OR** acetaminophen, ipratropium-albuterol, oxyCODONE-acetaminophen **AND** oxyCODONE Anti-infectives    Start     Dose/Rate Route Frequency Ordered Stop   03/09/17 2100  cefTRIAXone (ROCEPHIN) 1 g in dextrose 5 % 50 mL IVPB     1 g 100 mL/hr over 30 Minutes Intravenous Every 24 hours 03/09/17 0154     03/09/17 0153  azithromycin (ZITHROMAX) 500 mg in dextrose 5 % 250 mL IVPB  Status:  Discontinued     500 mg 250 mL/hr over 60 Minutes Intravenous Every 24 hours 03/09/17 0154 03/09/17 0232   03/08/17 2100  cefTRIAXone (ROCEPHIN) 1 g in dextrose 5 % 50 mL IVPB     1 g 100 mL/hr over 30 Minutes Intravenous  Once 03/08/17 2058 03/08/17 2204   03/08/17 2100  azithromycin (ZITHROMAX) 500 mg  in dextrose 5 % 250 mL IVPB     500 mg 250 mL/hr over 60 Minutes Intravenous Every 24 hours 03/08/17 2058        Assessment: 76 yo male with PMH COPD, mechanical AVR, PVD, CAD, HTN, GI bleed, HLD, DM2.  Admitted for PNA.  Pharmacy asked to dose warfarin.  Pt with confusion last evening and family unable to confirm if patient had taken evening warfarin dose.  INR in ED was 1.8.  Due to Pt on several medications that are potentiators of warfarin and hx GI bleed, chose to get INR this AM=1.99 to evaluate need for dose before normally scheduled 1800 dose.  Since INR still subtherapeutic, will give 2.5mg  this AM.    Goal of Therapy:  INR 2.5-3.5   Plan:  Warfarin 2.5mg  once, now Preliminary review of pertinent patient information completed.  Forestine Na clinical pharmacist will complete review during morning rounds to  assess the patient and finalize treatment regimen.  [place], Magdalene Molly, RPH 03/09/2017,6:27 AM   Addum:  Cont daily PT/INR.  Will not give another dose of coumadin today as patient now on IV antibiotics and NPO. Excell Seltzer, PharmD

## 2017-03-10 ENCOUNTER — Inpatient Hospital Stay (HOSPITAL_COMMUNITY): Payer: Medicare Other

## 2017-03-10 DIAGNOSIS — J410 Simple chronic bronchitis: Secondary | ICD-10-CM

## 2017-03-10 DIAGNOSIS — E1100 Type 2 diabetes mellitus with hyperosmolarity without nonketotic hyperglycemic-hyperosmolar coma (NKHHC): Secondary | ICD-10-CM

## 2017-03-10 LAB — GLUCOSE, CAPILLARY
GLUCOSE-CAPILLARY: 193 mg/dL — AB (ref 65–99)
Glucose-Capillary: 207 mg/dL — ABNORMAL HIGH (ref 65–99)
Glucose-Capillary: 203 mg/dL — ABNORMAL HIGH (ref 65–99)
Glucose-Capillary: 193 mg/dL — ABNORMAL HIGH (ref 65–99)
Glucose-Capillary: 188 mg/dL — ABNORMAL HIGH (ref 65–99)

## 2017-03-10 LAB — CBC
HCT: 33.6 % — ABNORMAL LOW (ref 39.0–52.0)
Hemoglobin: 11 g/dL — ABNORMAL LOW (ref 13.0–17.0)
MCH: 30.7 pg (ref 26.0–34.0)
MCHC: 32.7 g/dL (ref 30.0–36.0)
MCV: 93.9 fL (ref 78.0–100.0)
PLATELETS: 224 10*3/uL (ref 150–400)
RBC: 3.58 MIL/uL — ABNORMAL LOW (ref 4.22–5.81)
RDW: 16.7 % — AB (ref 11.5–15.5)
WBC: 12.6 10*3/uL — AB (ref 4.0–10.5)

## 2017-03-10 LAB — BASIC METABOLIC PANEL
ANION GAP: 8 (ref 5–15)
BUN: 33 mg/dL — ABNORMAL HIGH (ref 6–20)
CALCIUM: 8.9 mg/dL (ref 8.9–10.3)
CO2: 28 mmol/L (ref 22–32)
CREATININE: 1.11 mg/dL (ref 0.61–1.24)
Chloride: 102 mmol/L (ref 101–111)
GLUCOSE: 216 mg/dL — AB (ref 65–99)
Potassium: 4 mmol/L (ref 3.5–5.1)
Sodium: 138 mmol/L (ref 135–145)

## 2017-03-10 LAB — PROTIME-INR
INR: 2.67
Prothrombin Time: 29 seconds — ABNORMAL HIGH (ref 11.4–15.2)

## 2017-03-10 LAB — TROPONIN I: TROPONIN I: 0.06 ng/mL — AB (ref <0.03)

## 2017-03-10 MED ORDER — METOPROLOL TARTRATE 25 MG PO TABS: 12.5000 mg | ORAL_TABLET | Freq: Two times a day (BID) | ORAL | Status: DC

## 2017-03-10 MED ORDER — INSULIN GLARGINE 100 UNIT/ML ~~LOC~~ SOLN
20.0000 [IU] | Freq: Every day | SUBCUTANEOUS | Status: DC
  Administered 2017-03-10 – 2017-03-12 (×3): 20 [IU] via SUBCUTANEOUS
  Filled 2017-03-10 (×4): qty 0.2

## 2017-03-10 MED ORDER — WARFARIN SODIUM 1 MG PO TABS
1.0000 mg | ORAL_TABLET | Freq: Once | ORAL | Status: AC
  Administered 2017-03-10: 1 mg via ORAL
  Filled 2017-03-10: qty 1

## 2017-03-10 MED ORDER — ALBUTEROL SULFATE (2.5 MG/3ML) 0.083% IN NEBU: 2.5000 mg | INHALATION_SOLUTION | RESPIRATORY_TRACT | Status: DC | PRN

## 2017-03-10 NOTE — Progress Notes (Signed)
ANTICOAGULATION CONSULT NOTE   Pharmacy Consult for warfarin Indication: mechanical valve  No Known Allergies  Patient Measurements: Height: 5\' 11"  (180.3 cm) Weight: 208 lb 15.9 oz (94.8 kg) IBW/kg (Calculated) : 75.3 HEPARIN DW (KG): 94.3   Vital Signs: Temp: 97.6 F (36.4 C) (07/23 0800) Temp Source: Oral (07/23 0800) BP: 120/89 (07/23 0944) Pulse Rate: 101 (07/23 0944)  Labs:  Recent Labs  03/08/17 2053 03/09/17 0454 03/09/17 1143 03/09/17 1709 03/09/17 2345 03/10/17 0439 03/10/17 1034  HGB 12.2* 11.3*  --   --   --  11.0*  --   HCT 37.4* 35.0*  --   --   --  33.6*  --   PLT 260 237  --   --   --  224  --   LABPROT 21.8* 22.9*  --   --   --   --  29.0*  INR 1.87 1.99  --   --   --   --  2.67  CREATININE 1.56* 1.27*  --   --   --  1.11  --   TROPONINI  --   --  0.06* 0.06* 0.06*  --   --    Estimated Creatinine Clearance: 66.5 mL/min (by C-G formula based on SCr of 1.11 mg/dL).  Medical History: Past Medical History:  Diagnosis Date  . Acute blood loss anemia 02/2015 & 05/2015   Hemoglobin 5.5 on 05/31/15; status post transfusion.  . Arthritis   . CAD (coronary artery disease)    a. 05/13/14 [name] s/p overlapping DESx2 to SVG to RCA. b.  s/p CABG '90 with redo '94 & stent to RCA SVG in 2005  . Chronic back pain   . Chronic systolic heart failure ([place])   . Chronic toe ulcer ([place])    a. Left foot  . COPD (chronic obstructive pulmonary disease) ([place])   . Critical lower limb ischemia   . Dysphagia   . Essential hypertension   . GERD (gastroesophageal reflux disease)   . GI bleeding 05/31/2015   Source not identified.  . Gout   . History of kidney stones   . Hypercholesteremia   . Myocardial infarction ([place])   . Neuromuscular disorder ([place])   . Peripheral neuropathy   . Peripheral vascular disease ([place])    Lower extremity PCI/stenting  . S/P aortic valve replacement 1990   a. [employer]  . Sleep apnea   . Type 2 diabetes mellitus ([place]) 2007     Medications:  Scheduled:  . allopurinol  300 mg Oral Daily  . fenofibrate  160 mg Oral Daily  . guaiFENesin  1,200 mg Oral BID  . insulin aspart  0-15 Units Subcutaneous TID WC  . insulin aspart  0-5 Units Subcutaneous QHS  . insulin glargine  20 Units Subcutaneous Daily  . ipratropium-albuterol  3 mL Nebulization Q6H  . isosorbide mononitrate  60 mg Oral Daily  . methylPREDNISolone (SOLU-MEDROL) injection  40 mg Intravenous Q12H  . metoprolol tartrate  12.5 mg Oral BID  . pantoprazole  40 mg Oral Daily  . pregabalin  50 mg Oral TID  . rosuvastatin  20 mg Oral Daily  . vitamin C  500 mg Oral Daily  . Warfarin - Pharmacist Dosing Inpatient   Does not apply q1800   Infusions:  . azithromycin Stopped (03/09/17 2345)  . cefTRIAXone (ROCEPHIN)  IV Stopped (03/09/17 2313)   PRN: acetaminophen **OR** acetaminophen, albuterol, oxyCODONE-acetaminophen **AND** oxyCODONE Anti-infectives    Start     Dose/Rate  Route Frequency Ordered Stop   03/09/17 2100  cefTRIAXone (ROCEPHIN) 1 g in dextrose 5 % 50 mL IVPB     1 g 100 mL/hr over 30 Minutes Intravenous Every 24 hours 03/09/17 0154     03/09/17 0153  azithromycin (ZITHROMAX) 500 mg in dextrose 5 % 250 mL IVPB  Status:  Discontinued     500 mg 250 mL/hr over 60 Minutes Intravenous Every 24 hours 03/09/17 0154 03/09/17 0232   03/08/17 2100  cefTRIAXone (ROCEPHIN) 1 g in dextrose 5 % 50 mL IVPB     1 g 100 mL/hr over 30 Minutes Intravenous  Once 03/08/17 2058 03/08/17 2204   03/08/17 2100  azithromycin (ZITHROMAX) 500 mg in dextrose 5 % 250 mL IVPB     500 mg 250 mL/hr over 60 Minutes Intravenous Every 24 hours 03/08/17 2058        Assessment: 76 yo male with PMH COPD, mechanical AVR, PVD, CAD, HTN, GI bleed, HLD, DM2.  His INR today is therapeutic and has increased significantly from yesterday  Goal of Therapy:  INR 2.5-3.5   Plan:  Coumadin 1 mg po today Daily PT/INR  Heavenleigh Petruzzi Poteet, RPH 03/10/2017,11:39 AM

## 2017-03-10 NOTE — Progress Notes (Signed)
Subjective: He has some issues with arrhythmia yesterday and now is on Xopenex. He did better yesterday with less respiratory distress. Chest x-ray this morning about the same as yesterday which I have personally reviewed.  Objective: Vital signs in last 24 hours: Temp:  [98 F (36.7 C)-99.6 F (37.6 C)] 98.3 F (36.8 C) (07/23 0400) Pulse Rate:  [84-105] 85 (07/23 0600) Resp:  [21-35] 24 (07/23 0600) BP: (100-157)/(50-98) 125/59 (07/23 0600) SpO2:  [92 %-97 %] 96 % (07/23 0650) Weight:  [94.8 kg (208 lb 15.9 oz)] 94.8 kg (208 lb 15.9 oz) (07/23 0500) Weight change: -9.527 kg (-21 lb 0.1 oz) Last BM Date: 03/08/17  Intake/Output from previous day: 07/22 0701 - 07/23 0700 In: 420 [P.O.:420] Out: 150 [Urine:150]  PHYSICAL EXAM Constitutional: He is sleepy but arousable. Cardiovascular: He has significant arrhythmia and at times it looks like he has some atrial fib. He has prosthetic heart valve sounds. Respiratory: His respiratory effort is much decreased from yesterday. He still has rales and rhonchi on the left. Gastrointestinal: His abdomen is soft with no masses. Skin: Warm and dry. Ears nose mouth and throat: Mucous membranes are moist. His throat is clear  Lab Results:  Results for orders placed or performed during the hospital encounter of 03/08/17 (from the past 48 hour(s))  Comprehensive metabolic panel     Status: Abnormal   Collection Time: 03/08/17  8:53 PM  Result Value Ref Range   Sodium 142 135 - 145 mmol/Pena   Potassium 4.1 3.5 - 5.1 mmol/Pena   Chloride 106 101 - 111 mmol/Pena   CO2 25 22 - 32 mmol/Pena   Glucose, Bld 105 (H) 65 - 99 mg/dL   BUN 34 (H) 6 - 20 mg/dL   Creatinine, Ser 1.56 (H) 0.61 - 1.24 mg/dL   Calcium 9.5 8.9 - 10.3 mg/dL   Total Protein 7.1 6.5 - 8.1 g/dL   Albumin 3.5 3.5 - 5.0 g/dL   AST 48 (H) 15 - 41 U/Pena   ALT 18 17 - 63 U/Pena   Alkaline Phosphatase 31 (Pena) 38 - 126 U/Pena   Total Bilirubin 1.1 0.3 - 1.2 mg/dL   GFR calc non Af Amer 41 (Pena) >60 mL/min    GFR calc Af Amer 48 (Pena) >60 mL/min    Comment: (NOTE) The eGFR has been calculated using the CKD EPI equation. This calculation has not been validated in all clinical situations. eGFR's persistently <60 mL/min signify possible Chronic Kidney Disease.    Anion gap 11 5 - 15  CBC     Status: Abnormal   Collection Time: 03/08/17  8:53 PM  Result Value Ref Range   WBC 13.6 (H) 4.0 - 10.5 K/uL   RBC 4.01 (Pena) 4.22 - 5.81 MIL/uL   Hemoglobin 12.2 (Pena) 13.0 - 17.0 g/dL   HCT 37.4 (Pena) 39.0 - 52.0 %   MCV 93.3 78.0 - 100.0 fL   MCH 30.4 26.0 - 34.0 pg   MCHC 32.6 30.0 - 36.0 g/dL   RDW 16.7 (H) 11.5 - 15.5 %   Platelets 260 150 - 400 K/uL  Protime-INR - (order if patient is taking Coumadin / Warfarin)     Status: Abnormal   Collection Time: 03/08/17  8:53 PM  Result Value Ref Range   Prothrombin Time 21.8 (H) 11.4 - 15.2 seconds   INR 1.87   Lactic acid, plasma     Status: Abnormal   Collection Time: 03/08/17  9:05 PM  Result Value Ref Range  Lactic Acid, Venous 2.0 (HH) 0.5 - 1.9 mmol/Pena    Comment: CRITICAL RESULT CALLED TO, READ BACK BY AND VERIFIED WITH: POINTDEXTER,M. AT 2157 ON 03/08/2017 BY EVA   CBG monitoring, ED     Status: Abnormal   Collection Time: 03/08/17  9:10 PM  Result Value Ref Range   Glucose-Capillary 106 (H) 65 - 99 mg/dL  Urinalysis, Routine w reflex microscopic     Status: None   Collection Time: 03/08/17  9:40 PM  Result Value Ref Range   Color, Urine YELLOW YELLOW   APPearance CLEAR CLEAR   Specific Gravity, Urine 1.013 1.005 - 1.030   pH 5.0 5.0 - 8.0   Glucose, UA NEGATIVE NEGATIVE mg/dL   Hgb urine dipstick NEGATIVE NEGATIVE   Bilirubin Urine NEGATIVE NEGATIVE   Ketones, ur NEGATIVE NEGATIVE mg/dL   Protein, ur NEGATIVE NEGATIVE mg/dL   Nitrite NEGATIVE NEGATIVE   Leukocytes, UA NEGATIVE NEGATIVE  Lactic acid, plasma     Status: None   Collection Time: 03/09/17 12:01 AM  Result Value Ref Range   Lactic Acid, Venous 1.2 0.5 - 1.9 mmol/Pena  MRSA  PCR Screening     Status: None   Collection Time: 03/09/17  2:05 AM  Result Value Ref Range   MRSA by PCR NEGATIVE NEGATIVE    Comment:        The GeneXpert MRSA Assay (FDA approved for NASAL specimens only), is one component of a comprehensive MRSA colonization surveillance program. It is not intended to diagnose MRSA infection nor to guide or monitor treatment for MRSA infections.   Glucose, capillary     Status: Abnormal   Collection Time: 03/09/17  2:36 AM  Result Value Ref Range   Glucose-Capillary 112 (H) 65 - 99 mg/dL   Comment 1 Notify RN   Comprehensive metabolic panel     Status: Abnormal   Collection Time: 03/09/17  4:54 AM  Result Value Ref Range   Sodium 141 135 - 145 mmol/Pena   Potassium 3.8 3.5 - 5.1 mmol/Pena   Chloride 105 101 - 111 mmol/Pena   CO2 26 22 - 32 mmol/Pena   Glucose, Bld 113 (H) 65 - 99 mg/dL   BUN 32 (H) 6 - 20 mg/dL   Creatinine, Ser 1.27 (H) 0.61 - 1.24 mg/dL   Calcium 8.9 8.9 - 10.3 mg/dL   Total Protein 6.2 (Pena) 6.5 - 8.1 g/dL   Albumin 3.0 (Pena) 3.5 - 5.0 g/dL   AST 40 15 - 41 U/Pena   ALT 15 (Pena) 17 - 63 U/Pena   Alkaline Phosphatase 29 (Pena) 38 - 126 U/Pena   Total Bilirubin 1.0 0.3 - 1.2 mg/dL   GFR calc non Af Amer 53 (Pena) >60 mL/min   GFR calc Af Amer >60 >60 mL/min    Comment: (NOTE) The eGFR has been calculated using the CKD EPI equation. This calculation has not been validated in all clinical situations. eGFR's persistently <60 mL/min signify possible Chronic Kidney Disease.    Anion gap 10 5 - 15  CBC     Status: Abnormal   Collection Time: 03/09/17  4:54 AM  Result Value Ref Range   WBC 13.5 (H) 4.0 - 10.5 K/uL   RBC 3.70 (Pena) 4.22 - 5.81 MIL/uL   Hemoglobin 11.3 (Pena) 13.0 - 17.0 g/dL   HCT 35.0 (Pena) 39.0 - 52.0 %   MCV 94.6 78.0 - 100.0 fL   MCH 30.5 26.0 - 34.0 pg   MCHC 32.3 30.0 - 36.0  g/dL   RDW 16.6 (H) 11.5 - 15.5 %   Platelets 237 150 - 400 K/uL  Protime-INR     Status: Abnormal   Collection Time: 03/09/17  4:54 AM  Result Value Ref  Range   Prothrombin Time 22.9 (H) 11.4 - 15.2 seconds   INR 1.99   Magnesium     Status: None   Collection Time: 03/09/17  4:54 AM  Result Value Ref Range   Magnesium 2.1 1.7 - 2.4 mg/dL  Glucose, capillary     Status: Abnormal   Collection Time: 03/09/17  8:01 AM  Result Value Ref Range   Glucose-Capillary 107 (H) 65 - 99 mg/dL  Blood gas, arterial     Status: Abnormal   Collection Time: 03/09/17 10:42 AM  Result Value Ref Range   O2 Content 3.0 Pena/min   Delivery systems NASAL CANNULA    pH, Arterial 7.439 7.350 - 7.450   pCO2 arterial 36.7 32.0 - 48.0 mmHg   pO2, Arterial 80.4 (Pena) 83.0 - 108.0 mmHg   Bicarbonate 25.3 20.0 - 28.0 mmol/Pena   Acid-Base Excess 0.8 0.0 - 2.0 mmol/Pena   O2 Saturation 96.1 %   Patient temperature 37.0    Collection site BRACHIAL ARTERY    Drawn by [id number]    Sample type ARTERIAL    Allens test (pass/fail) NOT INDICATED (A) PASS  Glucose, capillary     Status: Abnormal   Collection Time: 03/09/17 10:57 AM  Result Value Ref Range   Glucose-Capillary 132 (H) 65 - 99 mg/dL  Troponin I (q 6hr x 3)     Status: Abnormal   Collection Time: 03/09/17 11:43 AM  Result Value Ref Range   Troponin I 0.06 (HH) <0.03 ng/mL    Comment: CRITICAL RESULT CALLED TO, READ BACK BY AND VERIFIED WITH: JEFFERSON,V AT 1250 ON 03/09/2017 BY MOSLEY,J   Glucose, capillary     Status: Abnormal   Collection Time: 03/09/17  3:53 PM  Result Value Ref Range   Glucose-Capillary 201 (H) 65 - 99 mg/dL  Troponin I (q 6hr x 3)     Status: Abnormal   Collection Time: 03/09/17  5:09 PM  Result Value Ref Range   Troponin I 0.06 (HH) <0.03 ng/mL    Comment: CRITICAL VALUE NOTED.  VALUE IS CONSISTENT WITH PREVIOUSLY REPORTED AND CALLED VALUE.  Glucose, capillary     Status: Abnormal   Collection Time: 03/09/17  9:49 PM  Result Value Ref Range   Glucose-Capillary 206 (H) 65 - 99 mg/dL  Troponin I (q 6hr x 3)     Status: Abnormal   Collection Time: 03/09/17 11:45 PM  Result Value Ref  Range   Troponin I 0.06 (HH) <0.03 ng/mL    Comment: CRITICAL VALUE NOTED.  VALUE IS CONSISTENT WITH PREVIOUSLY REPORTED AND CALLED VALUE.  CBC     Status: Abnormal   Collection Time: 03/10/17  4:39 AM  Result Value Ref Range   WBC 12.6 (H) 4.0 - 10.5 K/uL   RBC 3.58 (Pena) 4.22 - 5.81 MIL/uL   Hemoglobin 11.0 (Pena) 13.0 - 17.0 g/dL   HCT 33.6 (Pena) 39.0 - 52.0 %   MCV 93.9 78.0 - 100.0 fL   MCH 30.7 26.0 - 34.0 pg   MCHC 32.7 30.0 - 36.0 g/dL   RDW 16.7 (H) 11.5 - 15.5 %   Platelets 224 150 - 400 K/uL  Basic metabolic panel     Status: Abnormal   Collection Time: 03/10/17  4:39 AM  Result Value Ref Range   Sodium 138 135 - 145 mmol/Pena   Potassium 4.0 3.5 - 5.1 mmol/Pena   Chloride 102 101 - 111 mmol/Pena   CO2 28 22 - 32 mmol/Pena   Glucose, Bld 216 (H) 65 - 99 mg/dL   BUN 33 (H) 6 - 20 mg/dL   Creatinine, Ser 1.11 0.61 - 1.24 mg/dL   Calcium 8.9 8.9 - 10.3 mg/dL   GFR calc non Af Amer >60 >60 mL/min   GFR calc Af Amer >60 >60 mL/min    Comment: (NOTE) The eGFR has been calculated using the CKD EPI equation. This calculation has not been validated in all clinical situations. eGFR's persistently <60 mL/min signify possible Chronic Kidney Disease.    Anion gap 8 5 - 15  Glucose, capillary     Status: Abnormal   Collection Time: 03/10/17  6:22 AM  Result Value Ref Range   Glucose-Capillary 207 (H) 65 - 99 mg/dL    ABGS  Recent Labs  03/09/17 1042  PHART 7.439  PO2ART 80.4*  HCO3 25.3   CULTURES Recent Results (from the past 240 hour(s))  MRSA PCR Screening     Status: None   Collection Time: 03/09/17  2:05 AM  Result Value Ref Range Status   MRSA by PCR NEGATIVE NEGATIVE Final    Comment:        The GeneXpert MRSA Assay (FDA approved for NASAL specimens only), is one component of a comprehensive MRSA colonization surveillance program. It is not intended to diagnose MRSA infection nor to guide or monitor treatment for MRSA infections.    Studies/Results: Dg Chest Port  1 View  Result Date: 03/08/2017 CLINICAL DATA:  Cough. EXAM: PORTABLE CHEST 1 VIEW COMPARISON:  12/06/2015 FINDINGS: The cardio pericardial silhouette is enlarged. Retrocardiac left base collapse/consolidation is suspicious for pneumonia. Right lung appears hyperexpanded but clear. The visualized bony structures of the thorax are intact. Telemetry leads overlie the chest. IMPRESSION: Left base collapse/consolidation suspicious for pneumonia. Follow-up imaging recommended to ensure resolution. Electronically Signed   By: Misty Stanley M.D.   On: 03/08/2017 21:45    Medications:  Prior to Admission:  Prescriptions Prior to Admission  Medication Sig Dispense Refill Last Dose  . albuterol (PROVENTIL) 4 MG tablet Take 4 mg by mouth 2 (two) times daily.    03/08/2017 at Unknown time  . allopurinol (ZYLOPRIM) 300 MG tablet Take 300 mg by mouth daily.   03/08/2017 at Unknown time  . amoxicillin-clavulanate (AUGMENTIN) 875-125 MG tablet Take 1 tablet by mouth 2 (two) times daily.   Past Week at Unknown time  . cholecalciferol (VITAMIN D) 1000 UNITS tablet Take 2,000 Units by mouth daily.   03/08/2017 at Unknown time  . COMBIVENT RESPIMAT 20-100 MCG/ACT AERS respimat Inhale 1 puff into the lungs every 6 (six) hours as needed for wheezing or shortness of breath.    Past Week at Unknown time  . dexlansoprazole (DEXILANT) 60 MG capsule Take 1 capsule (60 mg total) by mouth daily. 90 capsule 3 03/08/2017 at Unknown time  . fenofibrate (TRICOR) 145 MG tablet TAKE 1 TABLET BY MOUTH ONCE DAILY FOR CHOLESTEROL. 30 tablet 0 03/08/2017 at Unknown time  . fish oil-omega-3 fatty acids 1000 MG capsule Take 1 capsule (1 g total) by mouth 2 (two) times daily.   03/08/2017 at Unknown time  . furosemide (LASIX) 80 MG tablet Take 1 tablet (80 mg total) by mouth daily. 30 tablet 0 03/08/2017 at Unknown time  . glimepiride (  AMARYL) 1 MG tablet Take 2 mg by mouth daily.    03/08/2017 at Unknown time  . Insulin Detemir (LEVEMIR) 100  UNIT/ML Pen Inject 20 Units into the skin daily. (Patient taking differently: Inject 30-40 Units into the skin 2 (two) times daily. ) 15 mL 11 03/08/2017 at Unknown time  . isosorbide mononitrate (IMDUR) 60 MG 24 hr tablet TAKE ONE TABLET BY MOUTH ONCE DAILY. 90 tablet 3 03/08/2017 at Unknown time  . metoprolol succinate (TOPROL-XL) 25 MG 24 hr tablet TAKE ONE TABLET BY MOUTH IN THE MORNING AND 1/2 TABLET IN THE EVENING. 45 tablet 8 03/08/2017 at Unknown time  . NITROSTAT 0.4 MG SL tablet PLACE 1 TAB UNDER TONGUE EVERY 5 MIN IF NEEDED FOR CHEST PAIN. MAY USE 3 TIMES.NO RELIEF CALL 911. 25 tablet 4 03/07/2017 at Unknown time  . oxyCODONE-acetaminophen (PERCOCET) 10-325 MG per tablet Take 1 tablet by mouth every 4 (four) hours as needed for pain. (Patient taking differently: Take 1 tablet by mouth every 3 (three) hours as needed for pain. ) 30 tablet 0 03/08/2017 at Unknown time  . potassium chloride SA (K-DUR,KLOR-CON) 20 MEQ tablet Take 20 mEq by mouth daily.     03/08/2017 at Unknown time  . pregabalin (LYRICA) 50 MG capsule Take one capsule by mouth twice daily for pains (Patient taking differently: Take 50 mg by mouth 3 (three) times daily. ) 60 capsule 5 03/08/2017 at Unknown time  . ramipril (ALTACE) 5 MG capsule TAKE 1 CAPSULE BY MOUTH EVERY MORNING. 30 capsule 0 03/08/2017 at Unknown time  . rosuvastatin (CRESTOR) 20 MG tablet Take 1 tablet (20 mg total) by mouth daily. CALL FOR APPT FOR FURTHER REFILLS, thanks! 15 tablet 0 03/08/2017 at Unknown time  . sitaGLIPtin (JANUVIA) 50 MG tablet Take 50 mg by mouth daily.   03/08/2017 at Unknown time  . vitamin C (ASCORBIC ACID) 500 MG tablet Take 500 mg by mouth daily.   03/08/2017 at Unknown time  . warfarin (COUMADIN) 5 MG tablet Take 1-1.5 tablets by mouth daily as directed by coumadin clinic (Patient taking differently: Take 2.5-5 mg by mouth See admin instructions. Take 14m on Mondays and Fridays. Take 2.573mon all other days) 40 tablet 1 03/08/2017 at Unknown  time  . Insulin Pen Needle 29G X 12.7MM MISC Use as directed 30 each 0 Taking   Scheduled: . allopurinol  300 mg Oral Daily  . fenofibrate  160 mg Oral Daily  . guaiFENesin  1,200 mg Oral BID  . insulin aspart  0-15 Units Subcutaneous TID WC  . insulin aspart  0-5 Units Subcutaneous QHS  . ipratropium-albuterol  3 mL Nebulization Q6H  . isosorbide mononitrate  60 mg Oral Daily  . methylPREDNISolone (SOLU-MEDROL) injection  40 mg Intravenous Q12H  . metoprolol tartrate  12.5 mg Oral BID  . pantoprazole  40 mg Oral Daily  . pregabalin  50 mg Oral TID  . rosuvastatin  20 mg Oral Daily  . vitamin C  500 mg Oral Daily  . Warfarin - Pharmacist Dosing Inpatient   Does not apply q1800   Continuous: . azithromycin Stopped (03/09/17 2345)  . cefTRIAXone (ROCEPHIN)  IV Stopped (03/09/17 2313)   PRQMV:[id number]*OR** acetaminophen, albuterol, oxyCODONE-acetaminophen **AND** oxyCODONE  Assesment: He was admitted with community-acquired pneumonia and mild sepsis. He had significant problems with acute on chronic hypoxic respiratory failure. He had respiratory distress. He is substantially better. Chest x-ray still shows a very large infiltrate on the left. At baseline he  has COPD. He has had mechanical aortic valve replacement. He looks like he's probably in atrial flutter fib or even wandering atrial pacemaker right now. Active Problems:   Long term current use of anticoagulant therapy   DM2 (diabetes mellitus, type 2) (HCC)   COPD (chronic obstructive pulmonary disease) (HCC)   LBBB (left bundle branch block)   HTN (hypertension)   Status post mechanical aortic valve replacement   S/P AVR (aortic valve replacement)   Sepsis ([place])    Plan: Continue current treatments    LOS: 2 days   Tanner Pena 03/10/2017, 7:23 AM

## 2017-03-10 NOTE — Progress Notes (Addendum)
[facility] PROGRESS NOTE  Tanner Pena EYC:[id number] DOB: November 11, 1940 DOA: 03/08/2017 PCP: Redmond School, MD  Brief summary   76 y.o. male with hx of COPD, CHF, mechanical AVR, PVD, CAD s/p CAD, hx of HTN, GI Bleed, CHF, HLD, DM2, presented to the ER with feeling weakness, confused, fever to 101.4, coughs, wheezing and SOB.  In the ER he was found to have very soft BP, and was given IVF.  His sensorium and delirium improved, and he was able to converse meaningfully.  Evaluation in the ER showed CXR with PNA, and Cr of 1.5, WBC of 13K.  His INR was 1.8.  Lactic acid was 2.0, and UA showed No evidence of infection.  Hospitalist was asked to admit him for CAP.    Assessment/Plan:  CAP. CXR- Left base collapse/consolidation. Denies respiratory distress. Transfer patient to telemetry.off bipap hypotensive on admission, lactic acidosis-resolved:  -cont iv antibiotic treatment, no blood cultures were drawn on admission.  Appreciate pulmonary input  HTN:   initially held ALL anti HTN meds due to soft Blood pressure.  Restart low-dose metoprolol, continue to hold Altace, Lasix, Imdur  CHF. Chronic systolic heart failure. clinically euvolemic. Resume low-dose BB, called Lasix due to soft BP. Monitor I/o, daily weight.    COPD: He has minimal wheezing.  Will continue bronchodilators, treat pneumonia. NiPPV as needed.    Mechanical AVR:  Will continue with coumadin per pharmacy dosing. Daily INR  DM: monitor on ISS while poor appetite. Resume Lantus   Transient confusion: delirium likely from fever, hypoxia, or pain meds. Neuro exam is non focal. improving   Prognosis is guarded with pneumonia, risk of organ dysfunction. Close monitor   Code Status: full Family Communication: d/w patient, called his friend updated. Called his children, now answer. Will try again (indicate person spoken with, relationship, and if by phone, the number) Disposition Plan:  Transfer to  telemetry   Consultants:  Pulmonology   Procedures:  none  Antibiotics: Anti-infectives    Start     Dose/Rate Route Frequency Ordered Stop   03/09/17 2100  cefTRIAXone (ROCEPHIN) 1 g in dextrose 5 % 50 mL IVPB     1 g 100 mL/hr over 30 Minutes Intravenous Every 24 hours 03/09/17 0154     03/09/17 0153  azithromycin (ZITHROMAX) 500 mg in dextrose 5 % 250 mL IVPB  Status:  Discontinued     500 mg 250 mL/hr over 60 Minutes Intravenous Every 24 hours 03/09/17 0154 03/09/17 0232   03/08/17 2100  cefTRIAXone (ROCEPHIN) 1 g in dextrose 5 % 50 mL IVPB     1 g 100 mL/hr over 30 Minutes Intravenous  Once 03/08/17 2058 03/08/17 2204   03/08/17 2100  azithromycin (ZITHROMAX) 500 mg in dextrose 5 % 250 mL IVPB     500 mg 250 mL/hr over 60 Minutes Intravenous Every 24 hours 03/08/17 2058         (indicate start date, and stop date if known)  HPI/Subjective: Patient currently on 3 L of oxygen, PACs noted on telemetry, sitting and eating breakfast, denies chest pain or shortness of breath   Objective: Vitals:   03/10/17 0700 03/10/17 0800  BP: 125/70 129/78  Pulse: 91 94  Resp: 20 20  Temp:      Intake/Output Summary (Last 24 hours) at 03/10/17 0851 Last data filed at 03/10/17 0500  Gross per 24 hour  Intake              420 ml  Output              150 ml  Net              270 ml   Filed Weights   03/09/17 0130 03/09/17 0500 03/10/17 0500  Weight: 94.8 kg (208 lb 15.9 oz) 95.1 kg (209 lb 10.5 oz) 94.8 kg (208 lb 15.9 oz)    Exam:   General:  In no acute distress, awake and alert  Cardiovascular: s1,s2 rrr  Respiratory: rales LL  Abdomen: soft, nt   Musculoskeletal: no right LE edema    Data Reviewed: Basic Metabolic Panel:  Recent Labs Lab 03/08/17 2053 03/09/17 0454 03/10/17 0439  NA 142 141 138  K 4.1 3.8 4.0  CL 106 105 102  CO2 25 26 28   GLUCOSE 105* 113* 216*  BUN 34* 32* 33*  CREATININE 1.56* 1.27* 1.11  CALCIUM 9.5 8.9 8.9  MG  --  2.1  --     Liver Function Tests:  Recent Labs Lab 03/08/17 2053 03/09/17 0454  AST 48* 40  ALT 18 15*  ALKPHOS 31* 29*  BILITOT 1.1 1.0  PROT 7.1 6.2*  ALBUMIN 3.5 3.0*   No results for input(s): LIPASE, AMYLASE in the last 168 hours. No results for input(s): AMMONIA in the last 168 hours. CBC:  Recent Labs Lab 03/08/17 2053 03/09/17 0454 03/10/17 0439  WBC 13.6* 13.5* 12.6*  HGB 12.2* 11.3* 11.0*  HCT 37.4* 35.0* 33.6*  MCV 93.3 94.6 93.9  PLT 260 237 224   Cardiac Enzymes:  Recent Labs Lab 03/09/17 1143 03/09/17 1709 03/09/17 2345  TROPONINI 0.06* 0.06* 0.06*   BNP (last 3 results) No results for input(s): BNP in the last 8760 hours.  ProBNP (last 3 results) No results for input(s): PROBNP in the last 8760 hours.  CBG:  Recent Labs Lab 03/09/17 1057 03/09/17 1553 03/09/17 2149 03/10/17 0622 03/10/17 0759  GLUCAP 132* 201* 206* 207* 203*    Recent Results (from the past 240 hour(s))  MRSA PCR Screening     Status: None   Collection Time: 03/09/17  2:05 AM  Result Value Ref Range Status   MRSA by PCR NEGATIVE NEGATIVE Final    Comment:        The GeneXpert MRSA Assay (FDA approved for NASAL specimens only), is one component of a comprehensive MRSA colonization surveillance program. It is not intended to diagnose MRSA infection nor to guide or monitor treatment for MRSA infections.      Studies: Dg Chest Port 1 View  Result Date: 03/10/2017 CLINICAL DATA:  Productive cough EXAM: PORTABLE CHEST 1 VIEW COMPARISON:  03/08/2017 FINDINGS: Patchy left upper and lower lobe opacities, progressed, suspicious for pneumonia. Mild right basilar opacity, possibly atelectasis. Left lung base is obscured.  No pneumothorax. Cardiomegaly. Median sternotomy. IMPRESSION: Multifocal patchy left lung opacities, progressed, suspicious for pneumonia Electronically Signed   By: Julian Hy M.D.   On: 03/10/2017 07:31   Dg Chest Port 1 View  Result Date:  03/08/2017 CLINICAL DATA:  Cough. EXAM: PORTABLE CHEST 1 VIEW COMPARISON:  12/06/2015 FINDINGS: The cardio pericardial silhouette is enlarged. Retrocardiac left base collapse/consolidation is suspicious for pneumonia. Right lung appears hyperexpanded but clear. The visualized bony structures of the thorax are intact. Telemetry leads overlie the chest. IMPRESSION: Left base collapse/consolidation suspicious for pneumonia. Follow-up imaging recommended to ensure resolution. Electronically Signed   By: Misty Stanley M.D.   On: 03/08/2017 21:45    Scheduled Meds: . allopurinol  300 mg Oral Daily  . fenofibrate  160 mg Oral Daily  . guaiFENesin  1,200 mg Oral BID  . insulin aspart  0-15 Units Subcutaneous TID WC  . insulin aspart  0-5 Units Subcutaneous QHS  . ipratropium-albuterol  3 mL Nebulization Q6H  . isosorbide mononitrate  60 mg Oral Daily  . methylPREDNISolone (SOLU-MEDROL) injection  40 mg Intravenous Q12H  . metoprolol tartrate  12.5 mg Oral BID  . pantoprazole  40 mg Oral Daily  . pregabalin  50 mg Oral TID  . rosuvastatin  20 mg Oral Daily  . vitamin C  500 mg Oral Daily  . Warfarin - Pharmacist Dosing Inpatient   Does not apply q1800   Continuous Infusions: . azithromycin Stopped (03/09/17 2345)  . cefTRIAXone (ROCEPHIN)  IV Stopped (03/09/17 2313)    Active Problems:   Long term current use of anticoagulant therapy   DM2 (diabetes mellitus, type 2) (HCC)   COPD (chronic obstructive pulmonary disease) (HCC)   LBBB (left bundle branch block)   HTN (hypertension)   Status post mechanical aortic valve replacement   S/P AVR (aortic valve replacement)   Sepsis ([place])    Time spent: >35 minutes     Rumeal Cullipher  [facility]  . If 7PM-7AM, please contact night-coverage at [email or web address], password [facility] 03/10/2017, 8:51 AM  LOS: 2 days

## 2017-03-11 ENCOUNTER — Inpatient Hospital Stay (HOSPITAL_COMMUNITY): Payer: Medicare Other

## 2017-03-11 DIAGNOSIS — J9601 Acute respiratory failure with hypoxia: Secondary | ICD-10-CM

## 2017-03-11 DIAGNOSIS — Z952 Presence of prosthetic heart valve: Secondary | ICD-10-CM

## 2017-03-11 LAB — COMPREHENSIVE METABOLIC PANEL
ALBUMIN: 2.7 g/dL — AB (ref 3.5–5.0)
ALK PHOS: 34 U/L — AB (ref 38–126)
ALT: 26 U/L (ref 17–63)
ANION GAP: 8 (ref 5–15)
AST: 54 U/L — ABNORMAL HIGH (ref 15–41)
BUN: 40 mg/dL — ABNORMAL HIGH (ref 6–20)
CHLORIDE: 100 mmol/L — AB (ref 101–111)
CO2: 28 mmol/L (ref 22–32)
Calcium: 9.1 mg/dL (ref 8.9–10.3)
Creatinine, Ser: 1.11 mg/dL (ref 0.61–1.24)
GFR calc Af Amer: >60 mL/min (ref >60)
GFR calc non Af Amer: >60 mL/min (ref >60)
GLUCOSE: 212 mg/dL — AB (ref 65–99)
POTASSIUM: 4.5 mmol/L (ref 3.5–5.1)
SODIUM: 136 mmol/L (ref 135–145)
Total Bilirubin: 0.7 mg/dL (ref 0.3–1.2)
Total Protein: 6.3 g/dL — ABNORMAL LOW (ref 6.5–8.1)

## 2017-03-11 LAB — GLUCOSE, CAPILLARY
Glucose-Capillary: 203 mg/dL — ABNORMAL HIGH (ref 65–99)
Glucose-Capillary: 198 mg/dL — ABNORMAL HIGH (ref 65–99)
Glucose-Capillary: 249 mg/dL — ABNORMAL HIGH (ref 65–99)

## 2017-03-11 LAB — CBC
HCT: 33.7 % — ABNORMAL LOW (ref 39.0–52.0)
HEMOGLOBIN: 10.9 g/dL — AB (ref 13.0–17.0)
MCH: 30.2 pg (ref 26.0–34.0)
MCHC: 32.3 g/dL (ref 30.0–36.0)
MCV: 93.4 fL (ref 78.0–100.0)
PLATELETS: 252 10*3/uL (ref 150–400)
RBC: 3.61 MIL/uL — ABNORMAL LOW (ref 4.22–5.81)
RDW: 16.5 % — AB (ref 11.5–15.5)
WBC: 12.1 10*3/uL — ABNORMAL HIGH (ref 4.0–10.5)

## 2017-03-11 LAB — ECHOCARDIOGRAM COMPLETE
HEIGHTINCHES: 71 in
WEIGHTICAEL: 3343.94 [oz_av]

## 2017-03-11 LAB — MAGNESIUM: Magnesium: 2.4 mg/dL (ref 1.7–2.4)

## 2017-03-11 LAB — PROTIME-INR
INR: 2.49
Prothrombin Time: 27.4 seconds — ABNORMAL HIGH (ref 11.4–15.2)

## 2017-03-11 MED ORDER — METOPROLOL TARTRATE 25 MG PO TABS
25.0000 mg | ORAL_TABLET | Freq: Two times a day (BID) | ORAL | Status: DC
  Administered 2017-03-11 – 2017-03-12 (×3): 25 mg via ORAL
  Filled 2017-03-11 (×3): qty 1

## 2017-03-11 MED ORDER — WARFARIN SODIUM 2.5 MG PO TABS
2.5000 mg | ORAL_TABLET | Freq: Once | ORAL | Status: AC
  Administered 2017-03-11: 2.5 mg via ORAL
  Filled 2017-03-11: qty 1

## 2017-03-11 MED ORDER — DEXTROSE 5 % IV SOLN
2.0000 g | Freq: Once | INTRAVENOUS | Status: AC
  Administered 2017-03-11: 2 g via INTRAVENOUS
  Filled 2017-03-11: qty 4

## 2017-03-11 NOTE — Progress Notes (Signed)
Tanner Pena for warfarin Indication: mechanical valve  No Known Allergies  Patient Measurements: Height: 5\' 11"  (180.3 cm) Weight: 208 lb 15.9 oz (94.8 kg) IBW/kg (Calculated) : 75.3 HEPARIN DW (KG): 94.3  Vital Signs: Temp: 98.1 F (36.7 C) (07/24 1154) BP: 144/77 (07/24 1200) Pulse Rate: 97 (07/24 1200)  Labs:  Recent Labs  03/09/17 0454 03/09/17 1143 03/09/17 1709 03/09/17 2345 03/10/17 0439 03/10/17 1034 03/11/17 0511  HGB 11.3*  --   --   --  11.0*  --  10.9*  HCT 35.0*  --   --   --  33.6*  --  33.7*  PLT 237  --   --   --  224  --  252  LABPROT 22.9*  --   --   --   --  29.0* 27.4*  INR 1.99  --   --   --   --  2.67 2.49  CREATININE 1.27*  --   --   --  1.11  --  1.11  TROPONINI  --  0.06* 0.06* 0.06*  --   --   --    Estimated Creatinine Clearance: 66.5 mL/min (by C-G formula based on SCr of 1.11 mg/dL).  Medical History: Past Medical History:  Diagnosis Date  . Acute blood loss anemia 02/2015 & 05/2015   Hemoglobin 5.5 on 05/31/15; status post transfusion.  . Arthritis   . CAD (coronary artery disease)    a. 05/13/14 [name] s/p overlapping DESx2 to SVG to RCA. b.  s/p CABG '90 with redo '94 & stent to RCA SVG in 2005  . Chronic back pain   . Chronic systolic heart failure ([place])   . Chronic toe ulcer ([place])    a. Left foot  . COPD (chronic obstructive pulmonary disease) ([place])   . Critical lower limb ischemia   . Dysphagia   . Essential hypertension   . GERD (gastroesophageal reflux disease)   . GI bleeding 05/31/2015   Source not identified.  . Gout   . History of kidney stones   . Hypercholesteremia   . Myocardial infarction ([place])   . Neuromuscular disorder ([place])   . Peripheral neuropathy   . Peripheral vascular disease ([place])    Lower extremity PCI/stenting  . S/P aortic valve replacement 1990   a. [employer]  . Sleep apnea   . Type 2 diabetes mellitus ([place]) 2007    Medications:  Scheduled:  . allopurinol   300 mg Oral Daily  . fenofibrate  160 mg Oral Daily  . guaiFENesin  1,200 mg Oral BID  . insulin aspart  0-15 Units Subcutaneous TID WC  . insulin aspart  0-5 Units Subcutaneous QHS  . insulin glargine  20 Units Subcutaneous Daily  . ipratropium-albuterol  3 mL Nebulization Q6H  . methylPREDNISolone (SOLU-MEDROL) injection  40 mg Intravenous Q12H  . metoprolol tartrate  25 mg Oral BID  . pantoprazole  40 mg Oral Daily  . pregabalin  50 mg Oral TID  . rosuvastatin  20 mg Oral Daily  . vitamin C  500 mg Oral Daily  . Warfarin - Pharmacist Dosing Inpatient   Does not apply q1800   Infusions:  . azithromycin Stopped (03/10/17 2135)  . cefTRIAXone (ROCEPHIN)  IV Stopped (03/10/17 2105)  . magnesium sulfate 1 - 4 g bolus IVPB 2 g (03/11/17 1127)   PRN: acetaminophen **OR** acetaminophen, albuterol, oxyCODONE-acetaminophen **AND** oxyCODONE Anti-infectives    Start     Dose/Rate Route  Frequency Ordered Stop   03/09/17 2100  cefTRIAXone (ROCEPHIN) 1 g in dextrose 5 % 50 mL IVPB     1 g 100 mL/hr over 30 Minutes Intravenous Every 24 hours 03/09/17 0154     03/09/17 0153  azithromycin (ZITHROMAX) 500 mg in dextrose 5 % 250 mL IVPB  Status:  Discontinued     500 mg 250 mL/hr over 60 Minutes Intravenous Every 24 hours 03/09/17 0154 03/09/17 0232   03/08/17 2100  cefTRIAXone (ROCEPHIN) 1 g in dextrose 5 % 50 mL IVPB     1 g 100 mL/hr over 30 Minutes Intravenous  Once 03/08/17 2058 03/08/17 2204   03/08/17 2100  azithromycin (ZITHROMAX) 500 mg in dextrose 5 % 250 mL IVPB     500 mg 250 mL/hr over 60 Minutes Intravenous Every 24 hours 03/08/17 2058       Assessment: 76 yo male with PMH COPD, mechanical AVR, PVD, CAD, HTN, GI bleed, HLD, DM2.  His INR today is therapeutic x 2 consecutive days.    Goal of Therapy:  INR 2.5-3.5   Plan:  Coumadin 2.5 mg po today Daily PT/INR  Ena Dawley, RPH 03/11/2017,12:36 PM

## 2017-03-11 NOTE — Progress Notes (Signed)
  Echocardiogram 2D Echocardiogram has been performed.  Tanner Pena L Androw 03/11/2017, 2:33 PM

## 2017-03-11 NOTE — Progress Notes (Signed)
Subjective: He says he feels much no new complaints. He is still coughing some. He is short of breath with exertion but generally feels better.better.  Objective: Vital signs in last 24 hours: Temp:  [97.6 F (36.4 C)-99.5 F (37.5 C)] 98.3 F (36.8 C) (07/24 0000) Pulse Rate:  [90-111] 111 (07/24 0600) Resp:  [20-32] 28 (07/24 0600) BP: (80-181)/(41-154) 114/41 (07/24 0600) SpO2:  [93 %-97 %] 93 % (07/24 0600) Weight change:  Last BM Date: 03/10/17 (per pt)  Intake/Output from previous day: 07/23 0701 - 07/24 0700 In: [place] [P.O.:240; IV Piggyback:1600] Out: -   PHYSICAL EXAM General appearance: alert, cooperative and no distress Resp: Bilateral rhonchi left greater than right Cardio: His heart is still somewhat irregular. He has mechanical heart valve sounds GI: soft, non-tender; bowel sounds normal; no masses,  no organomegaly Extremities: extremities normal, atraumatic, no cyanosis or edema Skin warm and dry  Lab Results:  Results for orders placed or performed during the hospital encounter of 03/08/17 (from the past 48 hour(s))  Glucose, capillary     Status: Abnormal   Collection Time: 03/09/17  8:01 AM  Result Value Ref Range   Glucose-Capillary 107 (H) 65 - 99 mg/dL  Blood gas, arterial     Status: Abnormal   Collection Time: 03/09/17 10:42 AM  Result Value Ref Range   O2 Content 3.0 L/min   Delivery systems NASAL CANNULA    pH, Arterial 7.439 7.350 - 7.450   pCO2 arterial 36.7 32.0 - 48.0 mmHg   pO2, Arterial 80.4 (L) 83.0 - 108.0 mmHg   Bicarbonate 25.3 20.0 - 28.0 mmol/L   Acid-Base Excess 0.8 0.0 - 2.0 mmol/L   O2 Saturation 96.1 %   Patient temperature 37.0    Collection site BRACHIAL ARTERY    Drawn by [id number]    Sample type ARTERIAL    Allens test (pass/fail) NOT INDICATED (A) PASS  Glucose, capillary     Status: Abnormal   Collection Time: 03/09/17 10:57 AM  Result Value Ref Range   Glucose-Capillary 132 (H) 65 - 99 mg/dL  Troponin I (q 6hr x 3)      Status: Abnormal   Collection Time: 03/09/17 11:43 AM  Result Value Ref Range   Troponin I 0.06 (HH) <0.03 ng/mL    Comment: CRITICAL RESULT CALLED TO, READ BACK BY AND VERIFIED WITH: JEFFERSON,V AT 1250 ON 03/09/2017 BY MOSLEY,J   Glucose, capillary     Status: Abnormal   Collection Time: 03/09/17  3:53 PM  Result Value Ref Range   Glucose-Capillary 201 (H) 65 - 99 mg/dL  Troponin I (q 6hr x 3)     Status: Abnormal   Collection Time: 03/09/17  5:09 PM  Result Value Ref Range   Troponin I 0.06 (HH) <0.03 ng/mL    Comment: CRITICAL VALUE NOTED.  VALUE IS CONSISTENT WITH PREVIOUSLY REPORTED AND CALLED VALUE.  Glucose, capillary     Status: Abnormal   Collection Time: 03/09/17  9:49 PM  Result Value Ref Range   Glucose-Capillary 206 (H) 65 - 99 mg/dL  Troponin I (q 6hr x 3)     Status: Abnormal   Collection Time: 03/09/17 11:45 PM  Result Value Ref Range   Troponin I 0.06 (HH) <0.03 ng/mL    Comment: CRITICAL VALUE NOTED.  VALUE IS CONSISTENT WITH PREVIOUSLY REPORTED AND CALLED VALUE.  CBC     Status: Abnormal   Collection Time: 03/10/17  4:39 AM  Result Value Ref Range   WBC 12.6 (  H) 4.0 - 10.5 K/uL   RBC 3.58 (L) 4.22 - 5.81 MIL/uL   Hemoglobin 11.0 (L) 13.0 - 17.0 g/dL   HCT 33.6 (L) 39.0 - 52.0 %   MCV 93.9 78.0 - 100.0 fL   MCH 30.7 26.0 - 34.0 pg   MCHC 32.7 30.0 - 36.0 g/dL   RDW 16.7 (H) 11.5 - 15.5 %   Platelets 224 150 - 400 K/uL  Basic metabolic panel     Status: Abnormal   Collection Time: 03/10/17  4:39 AM  Result Value Ref Range   Sodium 138 135 - 145 mmol/L   Potassium 4.0 3.5 - 5.1 mmol/L   Chloride 102 101 - 111 mmol/L   CO2 28 22 - 32 mmol/L   Glucose, Bld 216 (H) 65 - 99 mg/dL   BUN 33 (H) 6 - 20 mg/dL   Creatinine, Ser 1.11 0.61 - 1.24 mg/dL   Calcium 8.9 8.9 - 10.3 mg/dL   GFR calc non Af Amer >60 >60 mL/min   GFR calc Af Amer >60 >60 mL/min    Comment: (NOTE) The eGFR has been calculated using the CKD EPI equation. This calculation has not been  validated in all clinical situations. eGFR's persistently <60 mL/min signify possible Chronic Kidney Disease.    Anion gap 8 5 - 15  Glucose, capillary     Status: Abnormal   Collection Time: 03/10/17  6:22 AM  Result Value Ref Range   Glucose-Capillary 207 (H) 65 - 99 mg/dL  Glucose, capillary     Status: Abnormal   Collection Time: 03/10/17  7:59 AM  Result Value Ref Range   Glucose-Capillary 203 (H) 65 - 99 mg/dL  Protime-INR     Status: Abnormal   Collection Time: 03/10/17 10:34 AM  Result Value Ref Range   Prothrombin Time 29.0 (H) 11.4 - 15.2 seconds   INR 2.67   Glucose, capillary     Status: Abnormal   Collection Time: 03/10/17 11:49 AM  Result Value Ref Range   Glucose-Capillary 193 (H) 65 - 99 mg/dL  Glucose, capillary     Status: Abnormal   Collection Time: 03/10/17  4:31 PM  Result Value Ref Range   Glucose-Capillary 188 (H) 65 - 99 mg/dL  Glucose, capillary     Status: Abnormal   Collection Time: 03/10/17  9:35 PM  Result Value Ref Range   Glucose-Capillary 193 (H) 65 - 99 mg/dL  CBC     Status: Abnormal   Collection Time: 03/11/17  5:11 AM  Result Value Ref Range   WBC 12.1 (H) 4.0 - 10.5 K/uL   RBC 3.61 (L) 4.22 - 5.81 MIL/uL   Hemoglobin 10.9 (L) 13.0 - 17.0 g/dL   HCT 33.7 (L) 39.0 - 52.0 %   MCV 93.4 78.0 - 100.0 fL   MCH 30.2 26.0 - 34.0 pg   MCHC 32.3 30.0 - 36.0 g/dL   RDW 16.5 (H) 11.5 - 15.5 %   Platelets 252 150 - 400 K/uL  Comprehensive metabolic panel     Status: Abnormal   Collection Time: 03/11/17  5:11 AM  Result Value Ref Range   Sodium 136 135 - 145 mmol/L   Potassium 4.5 3.5 - 5.1 mmol/L   Chloride 100 (L) 101 - 111 mmol/L   CO2 28 22 - 32 mmol/L   Glucose, Bld 212 (H) 65 - 99 mg/dL   BUN 40 (H) 6 - 20 mg/dL   Creatinine, Ser 1.11 0.61 - 1.24 mg/dL  Calcium 9.1 8.9 - 10.3 mg/dL   Total Protein 6.3 (L) 6.5 - 8.1 g/dL   Albumin 2.7 (L) 3.5 - 5.0 g/dL   AST 54 (H) 15 - 41 U/L   ALT 26 17 - 63 U/L   Alkaline Phosphatase 34 (L) 38 -  126 U/L   Total Bilirubin 0.7 0.3 - 1.2 mg/dL   GFR calc non Af Amer >60 >60 mL/min   GFR calc Af Amer >60 >60 mL/min    Comment: (NOTE) The eGFR has been calculated using the CKD EPI equation. This calculation has not been validated in all clinical situations. eGFR's persistently <60 mL/min signify possible Chronic Kidney Disease.    Anion gap 8 5 - 15  Protime-INR     Status: Abnormal   Collection Time: 03/11/17  5:11 AM  Result Value Ref Range   Prothrombin Time 27.4 (H) 11.4 - 15.2 seconds   INR 2.49     ABGS  Recent Labs  03/09/17 1042  PHART 7.439  PO2ART 80.4*  HCO3 25.3   CULTURES Recent Results (from the past 240 hour(s))  MRSA PCR Screening     Status: None   Collection Time: 03/09/17  2:05 AM  Result Value Ref Range Status   MRSA by PCR NEGATIVE NEGATIVE Final    Comment:        The GeneXpert MRSA Assay (FDA approved for NASAL specimens only), is one component of a comprehensive MRSA colonization surveillance program. It is not intended to diagnose MRSA infection nor to guide or monitor treatment for MRSA infections.    Studies/Results: Dg Chest Port 1 View  Result Date: 03/10/2017 CLINICAL DATA:  Productive cough EXAM: PORTABLE CHEST 1 VIEW COMPARISON:  03/08/2017 FINDINGS: Patchy left upper and lower lobe opacities, progressed, suspicious for pneumonia. Mild right basilar opacity, possibly atelectasis. Left lung base is obscured.  No pneumothorax. Cardiomegaly. Median sternotomy. IMPRESSION: Multifocal patchy left lung opacities, progressed, suspicious for pneumonia Electronically Signed   By: Julian Hy M.D.   On: 03/10/2017 07:31    Medications:  Prior to Admission:  Prescriptions Prior to Admission  Medication Sig Dispense Refill Last Dose  . albuterol (PROVENTIL) 4 MG tablet Take 4 mg by mouth 2 (two) times daily.    03/08/2017 at Unknown time  . allopurinol (ZYLOPRIM) 300 MG tablet Take 300 mg by mouth daily.   03/08/2017 at Unknown time   . amoxicillin-clavulanate (AUGMENTIN) 875-125 MG tablet Take 1 tablet by mouth 2 (two) times daily.   Past Week at Unknown time  . cholecalciferol (VITAMIN D) 1000 UNITS tablet Take 2,000 Units by mouth daily.   03/08/2017 at Unknown time  . COMBIVENT RESPIMAT 20-100 MCG/ACT AERS respimat Inhale 1 puff into the lungs every 6 (six) hours as needed for wheezing or shortness of breath.    Past Week at Unknown time  . dexlansoprazole (DEXILANT) 60 MG capsule Take 1 capsule (60 mg total) by mouth daily. 90 capsule 3 03/08/2017 at Unknown time  . fenofibrate (TRICOR) 145 MG tablet TAKE 1 TABLET BY MOUTH ONCE DAILY FOR CHOLESTEROL. 30 tablet 0 03/08/2017 at Unknown time  . fish oil-omega-3 fatty acids 1000 MG capsule Take 1 capsule (1 g total) by mouth 2 (two) times daily.   03/08/2017 at Unknown time  . furosemide (LASIX) 80 MG tablet Take 1 tablet (80 mg total) by mouth daily. 30 tablet 0 03/08/2017 at Unknown time  . glimepiride (AMARYL) 1 MG tablet Take 2 mg by mouth daily.  03/08/2017 at Unknown time  . Insulin Detemir (LEVEMIR) 100 UNIT/ML Pen Inject 20 Units into the skin daily. (Patient taking differently: Inject 30-40 Units into the skin 2 (two) times daily. ) 15 mL 11 03/08/2017 at Unknown time  . isosorbide mononitrate (IMDUR) 60 MG 24 hr tablet TAKE ONE TABLET BY MOUTH ONCE DAILY. 90 tablet 3 03/08/2017 at Unknown time  . metoprolol succinate (TOPROL-XL) 25 MG 24 hr tablet TAKE ONE TABLET BY MOUTH IN THE MORNING AND 1/2 TABLET IN THE EVENING. 45 tablet 8 03/08/2017 at Unknown time  . NITROSTAT 0.4 MG SL tablet PLACE 1 TAB UNDER TONGUE EVERY 5 MIN IF NEEDED FOR CHEST PAIN. MAY USE 3 TIMES.NO RELIEF CALL 911. 25 tablet 4 03/07/2017 at Unknown time  . oxyCODONE-acetaminophen (PERCOCET) 10-325 MG per tablet Take 1 tablet by mouth every 4 (four) hours as needed for pain. (Patient taking differently: Take 1 tablet by mouth every 3 (three) hours as needed for pain. ) 30 tablet 0 03/08/2017 at Unknown time  .  potassium chloride SA (K-DUR,KLOR-CON) 20 MEQ tablet Take 20 mEq by mouth daily.     03/08/2017 at Unknown time  . pregabalin (LYRICA) 50 MG capsule Take one capsule by mouth twice daily for pains (Patient taking differently: Take 50 mg by mouth 3 (three) times daily. ) 60 capsule 5 03/08/2017 at Unknown time  . ramipril (ALTACE) 5 MG capsule TAKE 1 CAPSULE BY MOUTH EVERY MORNING. 30 capsule 0 03/08/2017 at Unknown time  . rosuvastatin (CRESTOR) 20 MG tablet Take 1 tablet (20 mg total) by mouth daily. CALL FOR APPT FOR FURTHER REFILLS, thanks! 15 tablet 0 03/08/2017 at Unknown time  . sitaGLIPtin (JANUVIA) 50 MG tablet Take 50 mg by mouth daily.   03/08/2017 at Unknown time  . vitamin C (ASCORBIC ACID) 500 MG tablet Take 500 mg by mouth daily.   03/08/2017 at Unknown time  . warfarin (COUMADIN) 5 MG tablet Take 1-1.5 tablets by mouth daily as directed by coumadin clinic (Patient taking differently: Take 2.5-5 mg by mouth See admin instructions. Take 2m on Mondays and Fridays. Take 2.567mon all other days) 40 tablet 1 03/08/2017 at Unknown time  . Insulin Pen Needle 29G X 12.7MM MISC Use as directed 30 each 0 Taking   Scheduled: . allopurinol  300 mg Oral Daily  . fenofibrate  160 mg Oral Daily  . guaiFENesin  1,200 mg Oral BID  . insulin aspart  0-15 Units Subcutaneous TID WC  . insulin aspart  0-5 Units Subcutaneous QHS  . insulin glargine  20 Units Subcutaneous Daily  . ipratropium-albuterol  3 mL Nebulization Q6H  . isosorbide mononitrate  60 mg Oral Daily  . methylPREDNISolone (SOLU-MEDROL) injection  40 mg Intravenous Q12H  . metoprolol tartrate  12.5 mg Oral BID  . pantoprazole  40 mg Oral Daily  . pregabalin  50 mg Oral TID  . rosuvastatin  20 mg Oral Daily  . vitamin C  500 mg Oral Daily  . Warfarin - Pharmacist Dosing Inpatient   Does not apply q1800   Continuous: . azithromycin Stopped (03/10/17 2135)  . cefTRIAXone (ROCEPHIN)  IV Stopped (03/10/17 2105)   PROQH:[id number]*OR**  acetaminophen, albuterol, oxyCODONE-acetaminophen **AND** oxyCODONE assesment:He was admitted with community-acquired pneumonia which is extensive, acute on chronic hypoxic respiratory failure, COPD exacerbation and he was septic. He has improved markedly. His respiratory rate occasionally gets into the higher 20s but he's not laboring now. Blood pressure has been soft but is improving. His  heart rate had been somewhat high but is improving. I agree with plans to transfer to telemetry but bed was not available yesterday. He was confused but that has improved AssesmenActive Problems:   Long term current use of anticoagulant therapy   DM2 (diabetes mellitus, type 2) (HCC)   COPD (chronic obstructive pulmonary disease) (HCC)   LBBB (left bundle branch block)   HTN (hypertension)   Status post mechanical aortic valve replacement   S/P AVR (aortic valve replacement)   Sepsis (HCC)    Plan:continue IV antibiotics. As mentioned I agree with transfer to telemetry.    LOS: 3 days   Cattleya Dobratz L 03/11/2017, 7:12 AM

## 2017-03-11 NOTE — Care Management Note (Signed)
Case Management Note  Patient Details  Name: Tanner Pena MRN: [id number] Date of Birth: 08/03/1941  Subjective/Objective:   Adm with PNA. From home with nephew. Has cane, WC, and scooter. Has PCP, family drives him to appointments. No HH PTA. Patient reports he is unsure if he is still active with [facility]. Unsure at this time If he will agree to [facility] EMMI discharge calls. CM will address again prior to discharge. Acutely on oxygen.                  Action/Plan: CM following. PT eval pending.    Expected Discharge Date:  03/14/17               Expected Discharge Plan:     In-House Referral:     Discharge planning Services  CM Consult  Post Acute Care Choice:    Choice offered to:     DME Arranged:    DME Agency:     HH Arranged:    HH Agency:     Status of Service:  In process, will continue to follow  If discussed at Long Length of Stay Meetings, dates discussed:    Additional Comments:  Kseniya Grunden, Chauncey Reading, RN 03/11/2017, 1:35 PM

## 2017-03-11 NOTE — Progress Notes (Signed)
PT Cancellation Note  Patient Details Name: Tanner Pena MRN: [id number] DOB: [birth year]   Cancelled Treatment:    Reason Eval/Treat Not Completed: Medical issues which prohibited therapy.  Resting HR 110.  Will attempt to see tomorrow; 03/12/2017   Rayetta Humphrey, PT CLT [phone] 03/11/2017, 9:49 AM

## 2017-03-11 NOTE — Progress Notes (Addendum)
[facility] PROGRESS NOTE  Tanner Pena LFY:[id number] DOB: [birth year] DOA: 03/08/2017 PCP: Redmond School, MD  Brief summary   76 y.o. male with hx of COPD, CHF, mechanical AVR, PVD, CAD s/p CAD, hx of HTN, GI Bleed, CHF, HLD, DM2, presented to the ER with feeling weakness, confused, fever to 101.4, coughs, wheezing and SOB.  In the ER he was found to have very soft BP, and was given IVF.  His sensorium and delirium improved, and he was able to converse meaningfully.  Evaluation in the ER showed CXR with PNA, and Cr of 1.5, WBC of 13K.  His INR was 1.8.  Lactic acid was 2.0, and UA showed No evidence of infection.  Hospitalist was asked to admit him for CAP.    Assessment/Plan:  CAP. CXR- Left base collapse/consolidation. Denies respiratory distress. Transfer patient to telemetry.off bipap hypotensive on admission, lactic acidosis-resolved: Blood pressure continues to be soft -cont iv azithromycin/Rocephin until 7/29, no blood cultures were drawn on admission.  Appreciate pulmonary input   HTN:   initially held  antihypertensive medications due to soft Blood pressure.  Increased dose of metoprolol to 25 mg twice a day due to PVCs, continue to hold Altace, Lasix, Imdur  CHF. Chronic systolic heart failure. Most recent 2-D echo 06/21/15-EF 40%, clinically euvolemic. Resume low-dose BB, called Lasix due to soft BP. Monitor I/o, daily weight.    COPD: He has minimal wheezing.  Will continue bronchodilators, treat pneumonia. NiPPV as needed.    Mechanical AVR:  Will continue with coumadin per pharmacy dosing. Daily INR  DM: monitor on ISS while poor appetite. Continue  Lantus  PVCs-potassium okay, will check magnesium, repeat 2-D echo to see if ejection fraction has changed. Increased dose of metoprolol  Transient confusion: delirium likely from fever, hypoxia, or pain meds. Neuro exam is non focal. improving       Code Status: full Family Communication: d/w patient,     Disposition Plan:  Transfer to telemetry   Consultants:  Pulmonology   Procedures:  none  Antibiotics: Anti-infectives    Start     Dose/Rate Route Frequency Ordered Stop   03/09/17 2100  cefTRIAXone (ROCEPHIN) 1 g in dextrose 5 % 50 mL IVPB     1 g 100 mL/hr over 30 Minutes Intravenous Every 24 hours 03/09/17 0154     03/09/17 0153  azithromycin (ZITHROMAX) 500 mg in dextrose 5 % 250 mL IVPB  Status:  Discontinued     500 mg 250 mL/hr over 60 Minutes Intravenous Every 24 hours 03/09/17 0154 03/09/17 0232   03/08/17 2100  cefTRIAXone (ROCEPHIN) 1 g in dextrose 5 % 50 mL IVPB     1 g 100 mL/hr over 30 Minutes Intravenous  Once 03/08/17 2058 03/08/17 2204   03/08/17 2100  azithromycin (ZITHROMAX) 500 mg in dextrose 5 % 250 mL IVPB     500 mg 250 mL/hr over 60 Minutes Intravenous Every 24 hours 03/08/17 2058         (indicate start date, and stop date if known)  HPI/Subjective: Patient currently on 3 L of oxygen,  PVCs   noted on telemetry, sitting and eating breakfast, denies chest pain or shortness of breath, blood pressure continues to be soft   Objective: Vitals:   03/11/17 0700 03/11/17 0800  BP: 122/69 97/63  Pulse: 99 (!) 110  Resp: (!) 31 (!) 28  Temp:  97.8 F (36.6 C)    Intake/Output Summary (Last 24 hours) at 03/11/17 0912 Last  data filed at 03/11/17 0800  Gross per 24 hour  Intake             1840 ml  Output             1400 ml  Net              440 ml   Filed Weights   03/09/17 0130 03/09/17 0500 03/10/17 0500  Weight: 94.8 kg (208 lb 15.9 oz) 95.1 kg (209 lb 10.5 oz) 94.8 kg (208 lb 15.9 oz)    Exam:   General:  In no acute distress, awake and alert  Cardiovascular: s1,s2 rrr  Respiratory: rales LL  Abdomen: soft, nt   Musculoskeletal: no right LE edema    Data Reviewed: Basic Metabolic Panel:  Recent Labs Lab 03/08/17 2053 03/09/17 0454 03/10/17 0439 03/11/17 0511  NA 142 141 138 136  K 4.1 3.8 4.0 4.5  CL 106 105 102  100*  CO2 25 26 28 28   GLUCOSE 105* 113* 216* 212*  BUN 34* 32* 33* 40*  CREATININE 1.56* 1.27* 1.11 1.11  CALCIUM 9.5 8.9 8.9 9.1  MG  --  2.1  --   --    Liver Function Tests:  Recent Labs Lab 03/08/17 2053 03/09/17 0454 03/11/17 0511  AST 48* 40 54*  ALT 18 15* 26  ALKPHOS 31* 29* 34*  BILITOT 1.1 1.0 0.7  PROT 7.1 6.2* 6.3*  ALBUMIN 3.5 3.0* 2.7*   No results for input(s): LIPASE, AMYLASE in the last 168 hours. No results for input(s): AMMONIA in the last 168 hours. CBC:  Recent Labs Lab 03/08/17 2053 03/09/17 0454 03/10/17 0439 03/11/17 0511  WBC 13.6* 13.5* 12.6* 12.1*  HGB 12.2* 11.3* 11.0* 10.9*  HCT 37.4* 35.0* 33.6* 33.7*  MCV 93.3 94.6 93.9 93.4  PLT 260 237 224 252   Cardiac Enzymes:  Recent Labs Lab 03/09/17 1143 03/09/17 1709 03/09/17 2345  TROPONINI 0.06* 0.06* 0.06*   BNP (last 3 results) No results for input(s): BNP in the last 8760 hours.  ProBNP (last 3 results) No results for input(s): PROBNP in the last 8760 hours.  CBG:  Recent Labs Lab 03/10/17 0759 03/10/17 1149 03/10/17 1631 03/10/17 2135 03/11/17 0747  GLUCAP 203* 193* 188* 193* 203*    Recent Results (from the past 240 hour(s))  MRSA PCR Screening     Status: None   Collection Time: 03/09/17  2:05 AM  Result Value Ref Range Status   MRSA by PCR NEGATIVE NEGATIVE Final    Comment:        The GeneXpert MRSA Assay (FDA approved for NASAL specimens only), is one component of a comprehensive MRSA colonization surveillance program. It is not intended to diagnose MRSA infection nor to guide or monitor treatment for MRSA infections.      Studies: Dg Chest Port 1 View  Result Date: 03/10/2017 CLINICAL DATA:  Productive cough EXAM: PORTABLE CHEST 1 VIEW COMPARISON:  03/08/2017 FINDINGS: Patchy left upper and lower lobe opacities, progressed, suspicious for pneumonia. Mild right basilar opacity, possibly atelectasis. Left lung base is obscured.  No pneumothorax.  Cardiomegaly. Median sternotomy. IMPRESSION: Multifocal patchy left lung opacities, progressed, suspicious for pneumonia Electronically Signed   By: Julian Hy M.D.   On: 03/10/2017 07:31    Scheduled Meds: . allopurinol  300 mg Oral Daily  . fenofibrate  160 mg Oral Daily  . guaiFENesin  1,200 mg Oral BID  . insulin aspart  0-15 Units Subcutaneous TID WC  .  insulin aspart  0-5 Units Subcutaneous QHS  . insulin glargine  20 Units Subcutaneous Daily  . ipratropium-albuterol  3 mL Nebulization Q6H  . isosorbide mononitrate  60 mg Oral Daily  . methylPREDNISolone (SOLU-MEDROL) injection  40 mg Intravenous Q12H  . metoprolol tartrate  12.5 mg Oral BID  . pantoprazole  40 mg Oral Daily  . pregabalin  50 mg Oral TID  . rosuvastatin  20 mg Oral Daily  . vitamin C  500 mg Oral Daily  . Warfarin - Pharmacist Dosing Inpatient   Does not apply q1800   Continuous Infusions: . azithromycin Stopped (03/10/17 2135)  . cefTRIAXone (ROCEPHIN)  IV Stopped (03/10/17 2105)    Active Problems:   Long term current use of anticoagulant therapy   DM2 (diabetes mellitus, type 2) (HCC)   COPD (chronic obstructive pulmonary disease) (HCC)   LBBB (left bundle branch block)   HTN (hypertension)   Status post mechanical aortic valve replacement   S/P AVR (aortic valve replacement)   Sepsis ([place])    Time spent: >35 minutes     Jarry Manon  [facility]  . If 7PM-7AM, please contact night-coverage at [email or web address], password [facility] 03/11/2017, 9:12 AM  LOS: 3 days

## 2017-03-12 DIAGNOSIS — J189 Pneumonia, unspecified organism: Secondary | ICD-10-CM

## 2017-03-12 LAB — BASIC METABOLIC PANEL
Anion gap: 10 (ref 5–15)
BUN: 43 mg/dL — ABNORMAL HIGH (ref 6–20)
CHLORIDE: 99 mmol/L — AB (ref 101–111)
CO2: 27 mmol/L (ref 22–32)
CREATININE: 1.02 mg/dL (ref 0.61–1.24)
Calcium: 9.2 mg/dL (ref 8.9–10.3)
GFR calc non Af Amer: >60 mL/min (ref >60)
Glucose, Bld: 213 mg/dL — ABNORMAL HIGH (ref 65–99)
POTASSIUM: 4.5 mmol/L (ref 3.5–5.1)
Sodium: 136 mmol/L (ref 135–145)

## 2017-03-12 LAB — GLUCOSE, CAPILLARY
GLUCOSE-CAPILLARY: 203 mg/dL — AB (ref 65–99)
GLUCOSE-CAPILLARY: 281 mg/dL — AB (ref 65–99)
Glucose-Capillary: 241 mg/dL — ABNORMAL HIGH (ref 65–99)
Glucose-Capillary: 249 mg/dL — ABNORMAL HIGH (ref 65–99)

## 2017-03-12 LAB — PROTIME-INR
INR: 2.56
Prothrombin Time: 28 seconds — ABNORMAL HIGH (ref 11.4–15.2)

## 2017-03-12 MED ORDER — AZITHROMYCIN 250 MG PO TABS
500.0000 mg | ORAL_TABLET | Freq: Every day | ORAL | Status: DC
  Administered 2017-03-12: 500 mg via ORAL
  Filled 2017-03-12: qty 2

## 2017-03-12 MED ORDER — PREDNISONE 10 MG PO TABS: 10.0000 mg | ORAL_TABLET | Freq: Every day | ORAL | 0 refills | Status: DC

## 2017-03-12 MED ORDER — WARFARIN SODIUM 2.5 MG PO TABS
2.5000 mg | ORAL_TABLET | Freq: Once | ORAL | Status: AC
  Administered 2017-03-12: 2.5 mg via ORAL
  Filled 2017-03-12: qty 1

## 2017-03-12 NOTE — Discharge Instructions (Signed)
Acute Respiratory Failure, Adult Acute respiratory failure occurs when there is not enough oxygen passing from your lungs to your body. When this happens, your lungs have trouble removing carbon dioxide from the blood. This causes your blood oxygen level to drop too low as carbon dioxide builds up. Acute respiratory failure is a medical emergency. It can develop quickly, but it is temporary if treated promptly. Your lung capacity, or how much air your lungs can hold, may improve with time, exercise, and treatment. What are the causes? There are many possible causes of acute respiratory failure, including:  Lung injury.  Chest injury or damage to the ribs or tissues near the lungs.  Lung conditions that affect the flow of air and blood into and out of the lungs, such as pneumonia, acute respiratory distress syndrome, and cystic fibrosis.  Medical conditions, such as strokes or spinal cord injuries, that affect the muscles and nerves that control breathing.  Blood infection (sepsis).  Inflammation of the pancreas (pancreatitis).  A blood clot in the lungs (pulmonary embolism).  A large-volume blood transfusion.  Burns.  Near-drowning.  Seizure.  Smoke inhalation.  Reaction to medicines.  Alcohol or drug overdose.  What increases the risk? This condition is more likely to develop in people who have:  A blocked airway.  Asthma.  A condition or disease that damages or weakens the muscles, nerves, bones, or tissues that are involved in breathing.  A serious infection.  A health problem that blocks the unconscious reflex that is involved in breathing, such as hypothyroidism or sleep apnea.  A lung injury or trauma.  What are the signs or symptoms? Trouble breathing is the main symptom of acute respiratory failure. Symptoms may also include:  Rapid breathing.  Restlessness or anxiety.  Skin, lips, or fingernails that appear blue (cyanosis).  Rapid heart  rate.  Abnormal heart rhythms (arrhythmias).  Confusion or changes in behavior.  Tiredness or loss of energy.  Feeling sleepy or having a loss of consciousness.  How is this diagnosed? Your health care provider can diagnose acute respiratory failure with a medical history and physical exam. During the exam, your health care provider will listen to your heart and check for crackling or wheezing sounds in your lungs. Your may also have tests to confirm the diagnosis and determine what is causing respiratory failure. These tests may include:  Measuring the amount of oxygen in your blood (pulse oximetry). The measurement comes from a small device that is placed on your finger, earlobe, or toe.  Other blood tests to measure blood gases and to look for signs of infection.  Sampling your cerebral spinal fluid or tracheal fluid to check for infections.  Chest X-ray to look for fluid in spaces that should be filled with air.  Electrocardiogram (ECG) to look at the heart's electrical activity.  How is this treated? Treatment for this condition usually takes places in a hospital intensive care unit (ICU). Treatment depends on what is causing the condition. It may include one or more treatments until your symptoms improve. Treatment may include:  Supplemental oxygen. Extra oxygen is given through a tube in the nose, a face mask, or a hood.  A device such as a continuous positive airway pressure (CPAP) or bi-level positive airway pressure (BiPAP or BPAP) machine. This treatment uses mild air pressure to keep the airways open. A mask or other device will be placed over your nose or mouth. A tube that is connected to a motor will deliver  oxygen through the mask.  Ventilator. This treatment helps move air into and out of the lungs. This may be done with a bag and mask or a machine. For this treatment, a tube is placed in your windpipe (trachea) so air and oxygen can flow to the lungs.  Extracorporeal  membrane oxygenation (ECMO). This treatment temporarily takes over the function of the heart and lungs, supplying oxygen and removing carbon dioxide. ECMO gives the lungs a chance to recover. It may be used if a ventilator is not effective.  Tracheostomy. This is a procedure that creates a hole in the neck to insert a breathing tube.  Receiving fluids and medicines.  Rocking the bed to help breathing.  Follow these instructions at home:  Take over-the-counter and prescription medicines only as told by your health care provider.  Return to normal activities as told by your health care provider. Ask your health care provider what activities are safe for you.  Keep all follow-up visits as told by your health care provider. This is important. How is this prevented? Treating infections and medical conditions that may lead to acute respiratory failure can help prevent the condition from developing. Contact a health care provider if:  You have a fever.  Your symptoms do not improve or they get worse. Get help right away if:  You are having trouble breathing.  You lose consciousness.  Your have cyanosis or turn blue.  You develop a rapid heart rate.  You are confused. These symptoms may represent a serious problem that is an emergency. Do not wait to see if the symptoms will go away. Get medical help right away. Call your local emergency services (911 in the [place] Do not drive yourself to the hospital. This information is not intended to replace advice given to you by your health care provider. Make sure you discuss any questions you have with your health care provider. Document Released: 08/10/2013 Document Revised: 03/02/2016 Document Reviewed: 02/21/2016 Elsevier Interactive Patient Education  [employer].

## 2017-03-12 NOTE — Care Management Important Message (Signed)
Important Message  Patient Details  Name: LENNIS KORB MRN: [id number] Date of Birth: [birth year]   Medicare Important Message Given:  Yes    Sherald Barge, RN 03/12/2017, 3:54 PM

## 2017-03-12 NOTE — Progress Notes (Signed)
Subjective: He says he feels substantially better. He is still coughing up sputum. He's got a little bit of swelling of his ankles. His echocardiogram showed severe left ventricular dysfunction with ejection fraction 20-25% and diffuse hypokinesis. He's not had any chest pain except when he coughs.  Objective: Vital signs in last 24 hours: Temp:  [97.6 F (36.4 C)-98.3 F (36.8 C)] 98.2 F (36.8 C) (07/25 0500) Pulse Rate:  [92-104] 104 (07/25 0500) Resp:  [20-32] 22 (07/25 0500) BP: (122-144)/(56-95) 140/75 (07/25 0500) SpO2:  [91 %-99 %] 97 % (07/25 0815) Weight change:  Last BM Date: 03/09/17  Intake/Output from previous day: 07/24 0701 - 07/25 0700 In: 250 [P.O.:250] Out: [place] [Urine:1575]  PHYSICAL EXAM General appearance: alert, cooperative and no distress Resp: Crackles in the left base Cardio: regular rate and rhythm, S1, S2 normal, no murmur, click, rub or gallop GI: soft, non-tender; bowel sounds normal; no masses,  no organomegaly Extremities: Minimal edema around his ankle His confusion seems to have cleared.  Lab Results:  Results for orders placed or performed during the hospital encounter of 03/08/17 (from the past 48 hour(s))  Protime-INR     Status: Abnormal   Collection Time: 03/10/17 10:34 AM  Result Value Ref Range   Prothrombin Time 29.0 (H) 11.4 - 15.2 seconds   INR 2.67   Glucose, capillary     Status: Abnormal   Collection Time: 03/10/17 11:49 AM  Result Value Ref Range   Glucose-Capillary 193 (H) 65 - 99 mg/dL  Glucose, capillary     Status: Abnormal   Collection Time: 03/10/17  4:31 PM  Result Value Ref Range   Glucose-Capillary 188 (H) 65 - 99 mg/dL  Glucose, capillary     Status: Abnormal   Collection Time: 03/10/17  9:35 PM  Result Value Ref Range   Glucose-Capillary 193 (H) 65 - 99 mg/dL  CBC     Status: Abnormal   Collection Time: 03/11/17  5:11 AM  Result Value Ref Range   WBC 12.1 (H) 4.0 - 10.5 K/uL   RBC 3.61 (L) 4.22 - 5.81  MIL/uL   Hemoglobin 10.9 (L) 13.0 - 17.0 g/dL   HCT 33.7 (L) 39.0 - 52.0 %   MCV 93.4 78.0 - 100.0 fL   MCH 30.2 26.0 - 34.0 pg   MCHC 32.3 30.0 - 36.0 g/dL   RDW 16.5 (H) 11.5 - 15.5 %   Platelets 252 150 - 400 K/uL  Comprehensive metabolic panel     Status: Abnormal   Collection Time: 03/11/17  5:11 AM  Result Value Ref Range   Sodium 136 135 - 145 mmol/L   Potassium 4.5 3.5 - 5.1 mmol/L   Chloride 100 (L) 101 - 111 mmol/L   CO2 28 22 - 32 mmol/L   Glucose, Bld 212 (H) 65 - 99 mg/dL   BUN 40 (H) 6 - 20 mg/dL   Creatinine, Ser 1.11 0.61 - 1.24 mg/dL   Calcium 9.1 8.9 - 10.3 mg/dL   Total Protein 6.3 (L) 6.5 - 8.1 g/dL   Albumin 2.7 (L) 3.5 - 5.0 g/dL   AST 54 (H) 15 - 41 U/L   ALT 26 17 - 63 U/L   Alkaline Phosphatase 34 (L) 38 - 126 U/L   Total Bilirubin 0.7 0.3 - 1.2 mg/dL   GFR calc non Af Amer >60 >60 mL/min   GFR calc Af Amer >60 >60 mL/min    Comment: (NOTE) The eGFR has been calculated using the CKD EPI  equation. This calculation has not been validated in all clinical situations. eGFR's persistently <60 mL/min signify possible Chronic Kidney Disease.    Anion gap 8 5 - 15  Protime-INR     Status: Abnormal   Collection Time: 03/11/17  5:11 AM  Result Value Ref Range   Prothrombin Time 27.4 (H) 11.4 - 15.2 seconds   INR 2.49   Magnesium     Status: None   Collection Time: 03/11/17  5:11 AM  Result Value Ref Range   Magnesium 2.4 1.7 - 2.4 mg/dL  Glucose, capillary     Status: Abnormal   Collection Time: 03/11/17  7:47 AM  Result Value Ref Range   Glucose-Capillary 203 (H) 65 - 99 mg/dL   Comment 1 Notify RN   Glucose, capillary     Status: Abnormal   Collection Time: 03/11/17 11:13 AM  Result Value Ref Range   Glucose-Capillary 198 (H) 65 - 99 mg/dL   Comment 1 Notify RN   Glucose, capillary     Status: Abnormal   Collection Time: 03/11/17 10:38 PM  Result Value Ref Range   Glucose-Capillary 249 (H) 65 - 99 mg/dL   Comment 1 Notify RN    Comment 2  Document in Chart   Protime-INR     Status: Abnormal   Collection Time: 03/12/17  4:11 AM  Result Value Ref Range   Prothrombin Time 28.0 (H) 11.4 - 15.2 seconds   INR 7.74   Basic metabolic panel     Status: Abnormal   Collection Time: 03/12/17  4:11 AM  Result Value Ref Range   Sodium 136 135 - 145 mmol/L   Potassium 4.5 3.5 - 5.1 mmol/L   Chloride 99 (L) 101 - 111 mmol/L   CO2 27 22 - 32 mmol/L   Glucose, Bld 213 (H) 65 - 99 mg/dL   BUN 43 (H) 6 - 20 mg/dL   Creatinine, Ser 1.02 0.61 - 1.24 mg/dL   Calcium 9.2 8.9 - 10.3 mg/dL   GFR calc non Af Amer >60 >60 mL/min   GFR calc Af Amer >60 >60 mL/min    Comment: (NOTE) The eGFR has been calculated using the CKD EPI equation. This calculation has not been validated in all clinical situations. eGFR's persistently <60 mL/min signify possible Chronic Kidney Disease.    Anion gap 10 5 - 15  Glucose, capillary     Status: Abnormal   Collection Time: 03/12/17  7:26 AM  Result Value Ref Range   Glucose-Capillary 241 (H) 65 - 99 mg/dL    ABGS  Recent Labs  03/09/17 1042  PHART 7.439  PO2ART 80.4*  HCO3 25.3   CULTURES Recent Results (from the past 240 hour(s))  MRSA PCR Screening     Status: None   Collection Time: 03/09/17  2:05 AM  Result Value Ref Range Status   MRSA by PCR NEGATIVE NEGATIVE Final    Comment:        The GeneXpert MRSA Assay (FDA approved for NASAL specimens only), is one component of a comprehensive MRSA colonization surveillance program. It is not intended to diagnose MRSA infection nor to guide or monitor treatment for MRSA infections.    Studies/Results: No results found.  Medications:  Prior to Admission:  Prescriptions Prior to Admission  Medication Sig Dispense Refill Last Dose  . albuterol (PROVENTIL) 4 MG tablet Take 4 mg by mouth 2 (two) times daily.    03/08/2017 at Unknown time  . allopurinol (ZYLOPRIM) 300 MG tablet  Take 300 mg by mouth daily.   03/08/2017 at Unknown time  .  amoxicillin-clavulanate (AUGMENTIN) 875-125 MG tablet Take 1 tablet by mouth 2 (two) times daily.   Past Week at Unknown time  . cholecalciferol (VITAMIN D) 1000 UNITS tablet Take 2,000 Units by mouth daily.   03/08/2017 at Unknown time  . COMBIVENT RESPIMAT 20-100 MCG/ACT AERS respimat Inhale 1 puff into the lungs every 6 (six) hours as needed for wheezing or shortness of breath.    Past Week at Unknown time  . dexlansoprazole (DEXILANT) 60 MG capsule Take 1 capsule (60 mg total) by mouth daily. 90 capsule 3 03/08/2017 at Unknown time  . fenofibrate (TRICOR) 145 MG tablet TAKE 1 TABLET BY MOUTH ONCE DAILY FOR CHOLESTEROL. 30 tablet 0 03/08/2017 at Unknown time  . fish oil-omega-3 fatty acids 1000 MG capsule Take 1 capsule (1 g total) by mouth 2 (two) times daily.   03/08/2017 at Unknown time  . furosemide (LASIX) 80 MG tablet Take 1 tablet (80 mg total) by mouth daily. 30 tablet 0 03/08/2017 at Unknown time  . glimepiride (AMARYL) 1 MG tablet Take 2 mg by mouth daily.    03/08/2017 at Unknown time  . Insulin Detemir (LEVEMIR) 100 UNIT/ML Pen Inject 20 Units into the skin daily. (Patient taking differently: Inject 30-40 Units into the skin 2 (two) times daily. ) 15 mL 11 03/08/2017 at Unknown time  . isosorbide mononitrate (IMDUR) 60 MG 24 hr tablet TAKE ONE TABLET BY MOUTH ONCE DAILY. 90 tablet 3 03/08/2017 at Unknown time  . metoprolol succinate (TOPROL-XL) 25 MG 24 hr tablet TAKE ONE TABLET BY MOUTH IN THE MORNING AND 1/2 TABLET IN THE EVENING. 45 tablet 8 03/08/2017 at Unknown time  . NITROSTAT 0.4 MG SL tablet PLACE 1 TAB UNDER TONGUE EVERY 5 MIN IF NEEDED FOR CHEST PAIN. MAY USE 3 TIMES.NO RELIEF CALL 911. 25 tablet 4 03/07/2017 at Unknown time  . oxyCODONE-acetaminophen (PERCOCET) 10-325 MG per tablet Take 1 tablet by mouth every 4 (four) hours as needed for pain. (Patient taking differently: Take 1 tablet by mouth every 3 (three) hours as needed for pain. ) 30 tablet 0 03/08/2017 at Unknown time  .  potassium chloride SA (K-DUR,KLOR-CON) 20 MEQ tablet Take 20 mEq by mouth daily.     03/08/2017 at Unknown time  . pregabalin (LYRICA) 50 MG capsule Take one capsule by mouth twice daily for pains (Patient taking differently: Take 50 mg by mouth 3 (three) times daily. ) 60 capsule 5 03/08/2017 at Unknown time  . ramipril (ALTACE) 5 MG capsule TAKE 1 CAPSULE BY MOUTH EVERY MORNING. 30 capsule 0 03/08/2017 at Unknown time  . rosuvastatin (CRESTOR) 20 MG tablet Take 1 tablet (20 mg total) by mouth daily. CALL FOR APPT FOR FURTHER REFILLS, thanks! 15 tablet 0 03/08/2017 at Unknown time  . sitaGLIPtin (JANUVIA) 50 MG tablet Take 50 mg by mouth daily.   03/08/2017 at Unknown time  . vitamin C (ASCORBIC ACID) 500 MG tablet Take 500 mg by mouth daily.   03/08/2017 at Unknown time  . warfarin (COUMADIN) 5 MG tablet Take 1-1.5 tablets by mouth daily as directed by coumadin clinic (Patient taking differently: Take 2.5-5 mg by mouth See admin instructions. Take 54m on Mondays and Fridays. Take 2.537mon all other days) 40 tablet 1 03/08/2017 at Unknown time  . Insulin Pen Needle 29G X 12.7MM MISC Use as directed 30 each 0 Taking   Scheduled: . allopurinol  300 mg Oral  Daily  . azithromycin  500 mg Oral q1800  . fenofibrate  160 mg Oral Daily  . guaiFENesin  1,200 mg Oral BID  . insulin aspart  0-15 Units Subcutaneous TID WC  . insulin aspart  0-5 Units Subcutaneous QHS  . insulin glargine  20 Units Subcutaneous Daily  . ipratropium-albuterol  3 mL Nebulization Q6H  . methylPREDNISolone (SOLU-MEDROL) injection  40 mg Intravenous Q12H  . metoprolol tartrate  25 mg Oral BID  . pantoprazole  40 mg Oral Daily  . pregabalin  50 mg Oral TID  . rosuvastatin  20 mg Oral Daily  . vitamin C  500 mg Oral Daily  . warfarin  2.5 mg Oral Once  . Warfarin - Pharmacist Dosing Inpatient   Does not apply q1800   Continuous: . cefTRIAXone (ROCEPHIN)  IV Stopped (03/11/17 2326)   DHR:CBULAGTXMIWOE **OR** acetaminophen,  albuterol, oxyCODONE-acetaminophen **AND** oxyCODONE  Assesment: He was admitted with acute respiratory failure. He tells me today that he is not on oxygen at home. I think his earlier history of taking oxygen at home was related to his confusion from his severe medical illness. He has community-acquired pneumonia and he was septic on admission and had pretty severe respiratory distress. He is markedly improved. At baseline he has had aortic valve replacement and has severe systolic congestive heart failure. That seems pretty stable now. He has long-term anticoagulant use. He has COPD at baseline which is severe. Active Problems:   Long term current use of anticoagulant therapy   DM2 (diabetes mellitus, type 2) (HCC)   COPD (chronic obstructive pulmonary disease) (HCC)   LBBB (left bundle branch block)   HTN (hypertension)   Status post mechanical aortic valve replacement   S/P AVR (aortic valve replacement)   Sepsis ([place])    Plan: He is markedly improved. I will plan to follow more peripherally. When he is discharged he will need to go home to finish up 10 days antibiotic treatment including the treatment here in the hospital. Follow-up chest x-ray to document that the pneumonia is clearing but that may take 6 weeks.    LOS: 4 days   Inman Fettig L 03/12/2017, 8:39 AM

## 2017-03-12 NOTE — Discharge Summary (Signed)
Physician Discharge Summary  Tanner Pena WLN:[id number] DOB: [birth year] DOA: 03/08/2017  PCP: Redmond School, MD  Admit date: 03/08/2017 Discharge date: 03/12/2017  Time spent: 45 minutes  Recommendations for Outpatient Follow-up:  -Will be discharged home today. -Please note that PT has recommended SNF, however patient refuses. Home health therapies will be arranged.   Discharge Diagnoses:  Active Problems:   Long term current use of anticoagulant therapy   DM2 (diabetes mellitus, type 2) (HCC)   COPD (chronic obstructive pulmonary disease) (HCC)   LBBB (left bundle branch block)   HTN (hypertension)   Status post mechanical aortic valve replacement   S/P AVR (aortic valve replacement)   Sepsis ([place])   Discharge Condition: Stable and improved  Filed Weights   03/09/17 0130 03/09/17 0500 03/10/17 0500  Weight: 94.8 kg (208 lb 15.9 oz) 95.1 kg (209 lb 10.5 oz) 94.8 kg (208 lb 15.9 oz)    History of present illness:  As per Dr. Marin Pena on 7/21: Tanner Pena is an 76 y.o. male with hx of COPD, mechanical AVR, PVD, CAD s/p CAD, hx of HTN, GI Bleed, CHF, HLD, DM2, presented to the ER with feeling weakness, confused, fever to 101.4, coughs, wheezing and SOB.  In the ER he was found to have very soft BP, and was given IVF.  His sensorium and delirium improved, and he was able to converse meaningfully.  Evaluation in the ER showed CXR with PNA, and Cr of 1.5, WBC of 13K.  His INR was 1.8.  Lactic acid was 2.0, and UA showed No evidence of infection.  Hospitalist was asked to admit him for CAP.   Hospital Course:   Community-acquired pneumonia -Clinically improved, will discharge home on 5 more days of Augmentin to complete a total of 8 days of treatment. -He remains quite weak and deconditioned and in fact physical therapy has recommended SNF post discharge, however patient adamantly refuses and is anxious about discharging home today. -Dr. Luan Pena has been  following.  Chronic systolic CHF -Most recent echo in November 2016 with an ejection fraction of 40% -Is compensated at this time.  Mechanical aortic valve replacement -Continue Coumadin.  Diabetes -Fairly well controlled while in the hospital, continue Lantus, Januvia.    Procedures:  None    Consultations:  None  Discharge Instructions  Discharge Instructions    Diet - low sodium heart healthy    Complete by:  As directed    Increase activity slowly    Complete by:  As directed      Allergies as of 03/12/2017   No Known Allergies     Medication List    STOP taking these medications   ramipril 5 MG capsule Commonly known as:  ALTACE     TAKE these medications   albuterol 4 MG tablet Commonly known as:  PROVENTIL Take 4 mg by mouth 2 (two) times daily.   allopurinol 300 MG tablet Commonly known as:  ZYLOPRIM Take 300 mg by mouth daily.   amoxicillin-clavulanate 875-125 MG tablet Commonly known as:  AUGMENTIN Take 1 tablet by mouth 2 (two) times daily.   cholecalciferol 1000 units tablet Commonly known as:  VITAMIN D Take 2,000 Units by mouth daily.   COMBIVENT RESPIMAT 20-100 MCG/ACT Aers respimat Generic drug:  Ipratropium-Albuterol Inhale 1 puff into the lungs every 6 (six) hours as needed for wheezing or shortness of breath.   dexlansoprazole 60 MG capsule Commonly known as:  DEXILANT Take 1 capsule (60 mg  total) by mouth daily.   fenofibrate 145 MG tablet Commonly known as:  TRICOR TAKE 1 TABLET BY MOUTH ONCE DAILY FOR CHOLESTEROL.   fish oil-omega-3 fatty acids 1000 MG capsule Take 1 capsule (1 g total) by mouth 2 (two) times daily.   furosemide 80 MG tablet Commonly known as:  LASIX Take 1 tablet (80 mg total) by mouth daily.   glimepiride 1 MG tablet Commonly known as:  AMARYL Take 2 mg by mouth daily.   Insulin Detemir 100 UNIT/ML Pen Commonly known as:  LEVEMIR Inject 20 Units into the skin daily. What changed:  how much  to take  when to take this   Insulin Pen Needle 29G X 12.7MM Misc Use as directed   isosorbide mononitrate 60 MG 24 hr tablet Commonly known as:  IMDUR TAKE ONE TABLET BY MOUTH ONCE DAILY.   metoprolol succinate 25 MG 24 hr tablet Commonly known as:  TOPROL-XL TAKE ONE TABLET BY MOUTH IN THE MORNING AND 1/2 TABLET IN THE EVENING.   NITROSTAT 0.4 MG SL tablet Generic drug:  nitroGLYCERIN PLACE 1 TAB UNDER TONGUE EVERY 5 MIN IF NEEDED FOR CHEST PAIN. MAY USE 3 TIMES.NO RELIEF CALL 911.   oxyCODONE-acetaminophen 10-325 MG tablet Commonly known as:  PERCOCET Take 1 tablet by mouth every 4 (four) hours as needed for pain. What changed:  when to take this   potassium chloride SA 20 MEQ tablet Commonly known as:  K-DUR,KLOR-CON Take 20 mEq by mouth daily.   predniSONE 10 MG tablet Commonly known as:  DELTASONE Take 1 tablet (10 mg total) by mouth daily with breakfast. Take 6 tablets today and then decrease by 1 tablet daily until none are left.   pregabalin 50 MG capsule Commonly known as:  LYRICA Take one capsule by mouth twice daily for pains What changed:  how much to take  how to take this  when to take this  additional instructions   rosuvastatin 20 MG tablet Commonly known as:  CRESTOR Take 1 tablet (20 mg total) by mouth daily. CALL FOR APPT FOR FURTHER REFILLS, thanks!   sitaGLIPtin 50 MG tablet Commonly known as:  JANUVIA Take 50 mg by mouth daily.   vitamin C 500 MG tablet Commonly known as:  ASCORBIC ACID Take 500 mg by mouth daily.   warfarin 5 MG tablet Commonly known as:  COUMADIN Take 1-1.5 tablets by mouth daily as directed by coumadin clinic What changed:  how much to take  how to take this  when to take this  additional instructions      No Known Allergies Follow-up Information    Redmond School, MD. Schedule an appointment as soon as possible for a visit in 2 week(s).   Specialty:  Internal Medicine Contact information: [address] [place] [address] [phone]            The results of significant diagnostics from this hospitalization (including imaging, microbiology, ancillary and laboratory) are listed below for reference.    Significant Diagnostic Studies: Dg Chest Port 1 View  Result Date: 03/10/2017 CLINICAL DATA:  Productive cough EXAM: PORTABLE CHEST 1 VIEW COMPARISON:  03/08/2017 FINDINGS: Patchy left upper and lower lobe opacities, progressed, suspicious for pneumonia. Mild right basilar opacity, possibly atelectasis. Left lung base is obscured.  No pneumothorax. Cardiomegaly. Median sternotomy. IMPRESSION: Multifocal patchy left lung opacities, progressed, suspicious for pneumonia Electronically Signed   By: Julian Hy M.D.   On: 03/10/2017 07:31   Dg Chest Port 1  View  Result Date: 03/08/2017 CLINICAL DATA:  Cough. EXAM: PORTABLE CHEST 1 VIEW COMPARISON:  12/06/2015 FINDINGS: The cardio pericardial silhouette is enlarged. Retrocardiac left base collapse/consolidation is suspicious for pneumonia. Right lung appears hyperexpanded but clear. The visualized bony structures of the thorax are intact. Telemetry leads overlie the chest. IMPRESSION: Left base collapse/consolidation suspicious for pneumonia. Follow-up imaging recommended to ensure resolution. Electronically Signed   By: Misty Stanley M.D.   On: 03/08/2017 21:45    Microbiology: Recent Results (from the past 240 hour(s))  MRSA PCR Screening     Status: None   Collection Time: 03/09/17  2:05 AM  Result Value Ref Range Status   MRSA by PCR NEGATIVE NEGATIVE Final    Pena:        The GeneXpert MRSA Assay (FDA approved for NASAL specimens only), is one component of a comprehensive MRSA colonization surveillance program. It is not intended to diagnose MRSA infection nor to guide or monitor treatment for MRSA infections.      Labs: Basic Metabolic Panel:  Recent Labs Lab 03/08/17 2053 03/09/17 0454  03/10/17 0439 03/11/17 0511 03/12/17 0411  NA 142 141 138 136 136  K 4.1 3.8 4.0 4.5 4.5  CL 106 105 102 100* 99*  CO2 25 26 28 28 27   GLUCOSE 105* 113* 216* 212* 213*  BUN 34* 32* 33* 40* 43*  CREATININE 1.56* 1.27* 1.11 1.11 1.02  CALCIUM 9.5 8.9 8.9 9.1 9.2  MG  --  2.1  --  2.4  --    Liver Function Tests:  Recent Labs Lab 03/08/17 2053 03/09/17 0454 03/11/17 0511  AST 48* 40 54*  ALT 18 15* 26  ALKPHOS 31* 29* 34*  BILITOT 1.1 1.0 0.7  PROT 7.1 6.2* 6.3*  ALBUMIN 3.5 3.0* 2.7*   No results for input(s): LIPASE, AMYLASE in the last 168 hours. No results for input(s): AMMONIA in the last 168 hours. CBC:  Recent Labs Lab 03/08/17 2053 03/09/17 0454 03/10/17 0439 03/11/17 0511  WBC 13.6* 13.5* 12.6* 12.1*  HGB 12.2* 11.3* 11.0* 10.9*  HCT 37.4* 35.0* 33.6* 33.7*  MCV 93.3 94.6 93.9 93.4  PLT 260 237 224 252   Cardiac Enzymes:  Recent Labs Lab 03/09/17 1143 03/09/17 1709 03/09/17 2345  TROPONINI 0.06* 0.06* 0.06*   BNP: BNP (last 3 results) No results for input(s): BNP in the last 8760 hours.  ProBNP (last 3 results) No results for input(s): PROBNP in the last 8760 hours.  CBG:  Recent Labs Lab 03/11/17 1113 03/11/17 1606 03/11/17 2238 03/12/17 0726 03/12/17 1117  GLUCAP 198* 203* 249* 241* 281*       Signed:  [facility] Pager: [phone] 03/12/2017, 4:00 PM

## 2017-03-12 NOTE — Care Management Note (Signed)
Case Management Note  Patient Details  Name: Tanner Pena MRN: [id number] Date of Birth: [birth year]  Subjective/Objective:                  Admitted with CAP. Pt is from home, he lives alone and has strong family support. He has a prosthesis, WC and RW he uses all as needed. He does not have supplemental oxygen. He has been recommended for SNF but does not want placement. He prefers [place] [facility] through [facility]. RN has completed home O2 assessment and pt does not meet criteria. Pt communicates no other needs than HH RN/PT.   Action/Plan: Pt will DC home with [facility] services through [facility]. Potential DC home today. CM has notified Juliann Pulse, [facility] rep, of potential DC today and will obtain pt info from chart. Pt made aware HH has 48hrs to make first visit. RN will be asked to monitor for oxygen needs after DC in the home.   Expected Discharge Date:  03/14/17               Expected Discharge Plan:  [place]  In-House Referral:  Clinical Social Work  Discharge planning Services  CM Consult  Post Acute Care Choice:  Home Health Choice offered to:  Patient  HH Arranged:  RN, PT [facility] Agency:  [place]  Status of Service:  Completed, signed off  Sherald Barge, RN 03/12/2017, 3:49 PM

## 2017-03-12 NOTE — Evaluation (Signed)
Physical Therapy Evaluation Patient Details Name: Tanner Pena MRN: [id number] DOB: October 09, 1940 Today's Date: 03/12/2017   History of Present Illness  Tanner Pena is a 76yo white male who comes to [facility] on 7/21 due to weakness, AMS, fever, cough, wheezing, and SOB; pt found to be with soft pressures and CAP. PMH: Left BKA c prosthesis, DM2, COPD, LBBB, HTN, s/p AVR, sepsis, PVD, GIB, CHF. Pt lives with his cousin, independent in ADL, household distance c Left transtibial prosthesis, and alternate SPC/4WW use. Pt uses a motorized WC for longer distances.   Clinical Impression  Pt admitted with above diagnosis. Pt currently with functional limitations due to the deficits listed below (see "PT Problem List"). Pt saturated 95% or greater on room air with activity throughout session, however prolonged activity is not pursued due to orthostatic drop in BP (110/59 seated -> 84/55 standing), tolerating less than 1 minute standing/amb. The patient is able to perform bed mobility, and donning of Left BKA prosthesis with modified independence. STS transfers are performed with supervision and RW from an elevated surface, however from a standard seat high he required min-mod physical assist, demonstrative of strength impairment from his baseline level. Standing trial x3, consistently produces increased HR with PVC (100-110s), tachypnea c SOB more consistent c typical presentation of hypercapnia rather than hypoxia (needs input from respiratory/pulmonology), and sudden progressive onset global weakness. Pt is typically able to amb ad lib in home with [facility] or 4WW, drive and access community with motorized WC. He is a good candidate for STR due to the acuity of his functional decline and ability to tolerate high frequency/ high volume intervention c PT. Pt will benefit from skilled PT intervention to increase independence and safety with basic mobility in preparation for discharge to the venue listed below.        Follow Up Recommendations SNF (pt will benefit from high-frequency, high-volume PT services for best return to PLOF, community access, self-care)    Equipment Recommendations  None recommended by PT    Recommendations for Other Services       Precautions / Restrictions Precautions Precautions: Fall Precaution Comments: Pt is orthostatic on 7/25. Required Braces or Orthoses:  (Left BKA prosthesis )      Mobility  Bed Mobility               General bed mobility comments: received up in bed, likely independent from patient report   Transfers Overall transfer level: Needs assistance Equipment used: Rolling walker (2 wheeled);1 person hand held assist Transfers: Sit to/from Stand Sit to Stand: From elevated surface;Min assist;Mod assist         General transfer comment: reports to be weaker than baseline; unable to maintain standing long enough before orthostatic weakness in legs (110/72mmHg seated->84/56mmHg)   Ambulation/Gait   Ambulation Distance (Feet): 8 Feet (around bed, then has to stop, likely d/t orthostasis, later established. ) Assistive device: Rolling walker (2 wheeled) (Left BKA prosthesis )          Stairs            Wheelchair Mobility    Modified Rankin (Stroke Patients Only)       Balance Overall balance assessment: Modified Independent                                           Pertinent Vitals/Pain Pain Assessment: No/denies pain  Home Living Family/patient expects to be discharged to:: Private residence Living Arrangements: Other relatives (c his cousin ) Available Help at Discharge: Family;Available 24 hours/day;Available PRN/intermittently Type of Home: House Home Access: Ramped entrance     Home Layout: One level Home Equipment: [place] - 4 wheels;Cane - single point;Wheelchair - power      Prior Function Level of Independence: Independent with assistive device(s)         Comments: SPC/4WW  for household distances; power WC for community distances     Hand Dominance        Extremity/Trunk Assessment        Lower Extremity Assessment Lower Extremity Assessment: Generalized weakness;Overall WFL for tasks assessed       Communication   Communication: No difficulties  Cognition Arousal/Alertness: Awake/alert Behavior During Therapy: WFL for tasks assessed/performed Overall Cognitive Status: Within Functional Limits for tasks assessed                                        General Comments      Exercises     Assessment/Plan    PT Assessment Patient needs continued PT services  PT Problem List Decreased mobility;Decreased activity tolerance;Decreased strength;Cardiopulmonary status limiting activity       PT Treatment Interventions Functional mobility training;Therapeutic exercise;Therapeutic activities;Balance training;Stair training;Gait training;Patient/family education    PT Goals (Current goals can be found in the Care Plan section)  Acute Rehab PT Goals Patient Stated Goal: regain strength, avoid falling  PT Goal Formulation: With patient Time For Goal Achievement: 03/26/17 Potential to Achieve Goals: Good    Frequency Min 2X/week   Barriers to discharge Inaccessible home environment      Co-evaluation               AM-PAC PT "6 Clicks" Daily Activity  Outcome Measure Difficulty turning over in bed (including adjusting bedclothes, sheets and blankets)?: A Little Difficulty moving from lying on back to sitting on the side of the bed? : A Little Difficulty sitting down on and standing up from a chair with arms (e.g., wheelchair, bedside commode, etc,.)?: A Lot Help needed moving to and from a bed to chair (including a wheelchair)?: A Lot Help needed walking in hospital room?: Total Help needed climbing 3-5 steps with a railing? : Total 6 Click Score: 12    End of Session Equipment Utilized During Treatment: Gait belt  (prosthesis ) Activity Tolerance: Patient tolerated treatment well;Treatment limited secondary to medical complications (Comment) (orthostatic hypotension ) Patient left: in bed Nurse Communication: Mobility status;Other (comment) PT Visit Diagnosis: Muscle weakness (generalized) (M62.81);Difficulty in walking, not elsewhere classified (R26.2)    Time: 1130-1202 PT Time Calculation (min) (ACUTE ONLY): 32 min   Charges:   PT Evaluation $PT Eval High Complexity: 1 Procedure PT Treatments $Therapeutic Activity: 8-22 mins   PT G Codes:        12:29 PM, 03/27/17 Etta Grandchild, PT, DPT Physical Therapist - [place] [phone] (Office)    Adaya Garmany C 03/27/17, 12:23 PM

## 2017-03-12 NOTE — Progress Notes (Signed)
PHARMACIST - PHYSICIAN COMMUNICATION DR:   Jerilee Hoh CONCERNING: Antibiotic IV to Oral Route Change Policy  RECOMMENDATION: This patient is receiving ZITHROMAX by the intravenous route.  Based on criteria approved by the Pharmacy and Therapeutics Committee, the antibiotic(s) is/are being converted to the equivalent oral dose form(s).  DESCRIPTION: These criteria include:  Patient being treated for a respiratory tract infection, urinary tract infection, cellulitis or clostridium difficile associated diarrhea if on metronidazole  The patient is not neutropenic and does not exhibit a GI malabsorption state  The patient is eating (either orally or via tube) and/or has been taking other orally administered medications for a least 24 hours  The patient is improving clinically and has a Tmax < 100.5  If you have questions about this conversion, please contact the Pharmacy Department  [x]   [phone] )  [facility] []   [phone] )  [facility] []   [phone] )  [facility] []   [phone] )  [facility] []   [phone] )  [place], PharmD Clinical Pharmacist Pager:  [phone] 03/12/2017

## 2017-03-12 NOTE — Progress Notes (Signed)
[place] for warfarin Indication: mechanical valve  No Known Allergies  Patient Measurements: Height: 5\' 11"  (180.3 cm) Weight: 208 lb 15.9 oz (94.8 kg) IBW/kg (Calculated) : 75.3 HEPARIN DW (KG): 94.3  Vital Signs: Temp: 98.2 F (36.8 C) (07/25 0500) Temp Source: Oral (07/25 0500) BP: 140/75 (07/25 0500) Pulse Rate: 104 (07/25 0500)  Labs:  Recent Labs  03/09/17 1143 03/09/17 1709 03/09/17 2345 03/10/17 0439 03/10/17 1034 03/11/17 0511 03/12/17 0411  HGB  --   --   --  11.0*  --  10.9*  --   HCT  --   --   --  33.6*  --  33.7*  --   PLT  --   --   --  224  --  252  --   LABPROT  --   --   --   --  29.0* 27.4* 28.0*  INR  --   --   --   --  2.67 2.49 2.56  CREATININE  --   --   --  1.11  --  1.11 1.02  TROPONINI 0.06* 0.06* 0.06*  --   --   --   --    Estimated Creatinine Clearance: 72.4 mL/min (by C-G formula based on SCr of 1.02 mg/dL).  Medical History: Past Medical History:  Diagnosis Date  . Acute blood loss anemia 02/2015 & 05/2015   Hemoglobin 5.5 on 05/31/15; status post transfusion.  . Arthritis   . CAD (coronary artery disease)    a. 05/13/14 [name] s/p overlapping DESx2 to SVG to RCA. b.  s/p CABG '90 with redo '94 & stent to RCA SVG in 2005  . Chronic back pain   . Chronic systolic heart failure ([place])   . Chronic toe ulcer ([place])    a. Left foot  . COPD (chronic obstructive pulmonary disease) ([place])   . Critical lower limb ischemia   . Dysphagia   . Essential hypertension   . GERD (gastroesophageal reflux disease)   . GI bleeding 05/31/2015   Source not identified.  . Gout   . History of kidney stones   . Hypercholesteremia   . Myocardial infarction ([place])   . Neuromuscular disorder ([place])   . Peripheral neuropathy   . Peripheral vascular disease ([place])    Lower extremity PCI/stenting  . S/P aortic valve replacement 1990   a. [employer]  . Sleep apnea   . Type 2 diabetes mellitus ([place]) 2007     Medications:  Scheduled:  . allopurinol  300 mg Oral Daily  . fenofibrate  160 mg Oral Daily  . guaiFENesin  1,200 mg Oral BID  . insulin aspart  0-15 Units Subcutaneous TID WC  . insulin aspart  0-5 Units Subcutaneous QHS  . insulin glargine  20 Units Subcutaneous Daily  . ipratropium-albuterol  3 mL Nebulization Q6H  . methylPREDNISolone (SOLU-MEDROL) injection  40 mg Intravenous Q12H  . metoprolol tartrate  25 mg Oral BID  . pantoprazole  40 mg Oral Daily  . pregabalin  50 mg Oral TID  . rosuvastatin  20 mg Oral Daily  . vitamin C  500 mg Oral Daily  . warfarin  2.5 mg Oral Once  . Warfarin - Pharmacist Dosing Inpatient   Does not apply q1800   Infusions:  . azithromycin Stopped (03/11/17 2155)  . cefTRIAXone (ROCEPHIN)  IV Stopped (03/11/17 2326)   PRN: acetaminophen **OR** acetaminophen, albuterol, oxyCODONE-acetaminophen **AND** oxyCODONE Anti-infectives    Start  Dose/Rate Route Frequency Ordered Stop   03/09/17 2100  cefTRIAXone (ROCEPHIN) 1 g in dextrose 5 % 50 mL IVPB     1 g 100 mL/hr over 30 Minutes Intravenous Every 24 hours 03/09/17 0154     03/09/17 0153  azithromycin (ZITHROMAX) 500 mg in dextrose 5 % 250 mL IVPB  Status:  Discontinued     500 mg 250 mL/hr over 60 Minutes Intravenous Every 24 hours 03/09/17 0154 03/09/17 0232   03/08/17 2100  cefTRIAXone (ROCEPHIN) 1 g in dextrose 5 % 50 mL IVPB     1 g 100 mL/hr over 30 Minutes Intravenous  Once 03/08/17 2058 03/08/17 2204   03/08/17 2100  azithromycin (ZITHROMAX) 500 mg in dextrose 5 % 250 mL IVPB     500 mg 250 mL/hr over 60 Minutes Intravenous Every 24 hours 03/08/17 2058       Assessment: 76 yo male with PMH COPD, mechanical AVR, PVD, CAD, HTN, GI bleed, HLD, DM2.  His INR today is therapeutic x 3 consecutive days.    Goal of Therapy:  INR 2.5-3.5   Plan:  Coumadin 2.5 mg po today Daily PT/INR  Ena Dawley, RPH 03/12/2017,8:20 AM

## 2017-03-12 NOTE — Progress Notes (Signed)
Inpatient Diabetes Program Recommendations  AACE/ADA: New Consensus Statement on Inpatient Glycemic Control (2015)  Target Ranges:  Prepandial:   less than 140 mg/dL      Peak postprandial:   less than 180 mg/dL (1-2 hours)      Critically ill patients:  140 - 180 mg/dL   Results for VISHNU, MOELLER (MRN [id number]) as of 03/12/2017 10:56  Ref. Range 03/11/2017 07:47 03/11/2017 11:13 03/11/2017 16:06 03/11/2017 22:38 03/12/2017 07:26  Glucose-Capillary Latest Ref Range: 65 - 99 mg/dL 203 (H) 198 (H) 203 (H) 249 (H) 241 (H)   Review of Glycemic Control  Diabetes history: DM2 Outpatient Diabetes medications: Amaryl 2 mg daily, Lantus 30-40 units BID, Janvia 50 mg daily Current orders for Inpatient glycemic control: Lantus 20 units daily, Novolog 0-15 units TID with melas, Novolog 0-5 units QHS  Inpatient Diabetes Program Recommendations: Insulin - Basal: Please consider increasing Lantus to 24 units daily. Insulin - Meal Coverage: Please consider ordering Novolog 4 units TID with meals for meal coverage if patient eats at least 50% of meals.  Thanks, Barnie Alderman, RN, MSN, CDE Diabetes Coordinator Inpatient Diabetes Program [phone] (Team Pager from 8am to 5pm)

## 2017-03-12 NOTE — Progress Notes (Signed)
Patient has been off of his O2 for approximately 4 hours. He has maintained O2 sats. RN checked patient's O2 before discharge and it was 95. Patient is excited about going home. No difficulty breathing or SOB. Discharge papers given, prescriptions given. Leaving via car with nephew and daughter  Margaret Pyle, RN

## 2017-03-13 DIAGNOSIS — I110 Hypertensive heart disease with heart failure: Secondary | ICD-10-CM | POA: Diagnosis not present

## 2017-03-13 DIAGNOSIS — Z7952 Long term (current) use of systemic steroids: Secondary | ICD-10-CM | POA: Diagnosis not present

## 2017-03-13 DIAGNOSIS — J440 Chronic obstructive pulmonary disease with acute lower respiratory infection: Secondary | ICD-10-CM | POA: Diagnosis not present

## 2017-03-13 DIAGNOSIS — J189 Pneumonia, unspecified organism: Secondary | ICD-10-CM | POA: Diagnosis not present

## 2017-03-13 DIAGNOSIS — I2510 Atherosclerotic heart disease of native coronary artery without angina pectoris: Secondary | ICD-10-CM | POA: Diagnosis not present

## 2017-03-13 DIAGNOSIS — Z794 Long term (current) use of insulin: Secondary | ICD-10-CM | POA: Diagnosis not present

## 2017-03-13 DIAGNOSIS — I10 Essential (primary) hypertension: Secondary | ICD-10-CM | POA: Diagnosis not present

## 2017-03-13 DIAGNOSIS — Z7901 Long term (current) use of anticoagulants: Secondary | ICD-10-CM | POA: Diagnosis not present

## 2017-03-13 DIAGNOSIS — Z7951 Long term (current) use of inhaled steroids: Secondary | ICD-10-CM | POA: Diagnosis not present

## 2017-03-13 DIAGNOSIS — E785 Hyperlipidemia, unspecified: Secondary | ICD-10-CM | POA: Diagnosis not present

## 2017-03-13 DIAGNOSIS — E1151 Type 2 diabetes mellitus with diabetic peripheral angiopathy without gangrene: Secondary | ICD-10-CM | POA: Diagnosis not present

## 2017-03-13 DIAGNOSIS — Z952 Presence of prosthetic heart valve: Secondary | ICD-10-CM | POA: Diagnosis not present

## 2017-03-13 DIAGNOSIS — Z79891 Long term (current) use of opiate analgesic: Secondary | ICD-10-CM | POA: Diagnosis not present

## 2017-03-13 DIAGNOSIS — I5022 Chronic systolic (congestive) heart failure: Secondary | ICD-10-CM | POA: Diagnosis not present

## 2017-03-14 ENCOUNTER — Other Ambulatory Visit: Payer: Self-pay | Admitting: Cardiovascular Disease

## 2017-03-14 DIAGNOSIS — E785 Hyperlipidemia, unspecified: Secondary | ICD-10-CM | POA: Diagnosis not present

## 2017-03-14 DIAGNOSIS — Z952 Presence of prosthetic heart valve: Secondary | ICD-10-CM | POA: Diagnosis not present

## 2017-03-14 DIAGNOSIS — I5022 Chronic systolic (congestive) heart failure: Secondary | ICD-10-CM | POA: Diagnosis not present

## 2017-03-14 DIAGNOSIS — Z7951 Long term (current) use of inhaled steroids: Secondary | ICD-10-CM | POA: Diagnosis not present

## 2017-03-14 DIAGNOSIS — E1151 Type 2 diabetes mellitus with diabetic peripheral angiopathy without gangrene: Secondary | ICD-10-CM | POA: Diagnosis not present

## 2017-03-14 DIAGNOSIS — I2510 Atherosclerotic heart disease of native coronary artery without angina pectoris: Secondary | ICD-10-CM | POA: Diagnosis not present

## 2017-03-14 DIAGNOSIS — I110 Hypertensive heart disease with heart failure: Secondary | ICD-10-CM | POA: Diagnosis not present

## 2017-03-14 DIAGNOSIS — J440 Chronic obstructive pulmonary disease with acute lower respiratory infection: Secondary | ICD-10-CM | POA: Diagnosis not present

## 2017-03-14 DIAGNOSIS — Z794 Long term (current) use of insulin: Secondary | ICD-10-CM | POA: Diagnosis not present

## 2017-03-14 DIAGNOSIS — J189 Pneumonia, unspecified organism: Secondary | ICD-10-CM | POA: Diagnosis not present

## 2017-03-14 DIAGNOSIS — Z7901 Long term (current) use of anticoagulants: Secondary | ICD-10-CM | POA: Diagnosis not present

## 2017-03-14 DIAGNOSIS — Z79891 Long term (current) use of opiate analgesic: Secondary | ICD-10-CM | POA: Diagnosis not present

## 2017-03-14 DIAGNOSIS — Z7952 Long term (current) use of systemic steroids: Secondary | ICD-10-CM | POA: Diagnosis not present

## 2017-03-17 DIAGNOSIS — E1151 Type 2 diabetes mellitus with diabetic peripheral angiopathy without gangrene: Secondary | ICD-10-CM | POA: Diagnosis not present

## 2017-03-17 DIAGNOSIS — Z7901 Long term (current) use of anticoagulants: Secondary | ICD-10-CM | POA: Diagnosis not present

## 2017-03-17 DIAGNOSIS — I2510 Atherosclerotic heart disease of native coronary artery without angina pectoris: Secondary | ICD-10-CM | POA: Diagnosis not present

## 2017-03-17 DIAGNOSIS — Z952 Presence of prosthetic heart valve: Secondary | ICD-10-CM | POA: Diagnosis not present

## 2017-03-17 DIAGNOSIS — Z7951 Long term (current) use of inhaled steroids: Secondary | ICD-10-CM | POA: Diagnosis not present

## 2017-03-17 DIAGNOSIS — Z79891 Long term (current) use of opiate analgesic: Secondary | ICD-10-CM | POA: Diagnosis not present

## 2017-03-17 DIAGNOSIS — Z7952 Long term (current) use of systemic steroids: Secondary | ICD-10-CM | POA: Diagnosis not present

## 2017-03-17 DIAGNOSIS — I110 Hypertensive heart disease with heart failure: Secondary | ICD-10-CM | POA: Diagnosis not present

## 2017-03-17 DIAGNOSIS — J440 Chronic obstructive pulmonary disease with acute lower respiratory infection: Secondary | ICD-10-CM | POA: Diagnosis not present

## 2017-03-17 DIAGNOSIS — Z794 Long term (current) use of insulin: Secondary | ICD-10-CM | POA: Diagnosis not present

## 2017-03-17 DIAGNOSIS — J189 Pneumonia, unspecified organism: Secondary | ICD-10-CM | POA: Diagnosis not present

## 2017-03-17 DIAGNOSIS — E785 Hyperlipidemia, unspecified: Secondary | ICD-10-CM | POA: Diagnosis not present

## 2017-03-17 DIAGNOSIS — I5022 Chronic systolic (congestive) heart failure: Secondary | ICD-10-CM | POA: Diagnosis not present

## 2017-03-18 DIAGNOSIS — E785 Hyperlipidemia, unspecified: Secondary | ICD-10-CM | POA: Diagnosis not present

## 2017-03-18 DIAGNOSIS — Z79891 Long term (current) use of opiate analgesic: Secondary | ICD-10-CM | POA: Diagnosis not present

## 2017-03-18 DIAGNOSIS — I110 Hypertensive heart disease with heart failure: Secondary | ICD-10-CM | POA: Diagnosis not present

## 2017-03-18 DIAGNOSIS — J440 Chronic obstructive pulmonary disease with acute lower respiratory infection: Secondary | ICD-10-CM | POA: Diagnosis not present

## 2017-03-18 DIAGNOSIS — Z952 Presence of prosthetic heart valve: Secondary | ICD-10-CM | POA: Diagnosis not present

## 2017-03-18 DIAGNOSIS — J189 Pneumonia, unspecified organism: Secondary | ICD-10-CM | POA: Diagnosis not present

## 2017-03-18 DIAGNOSIS — I25119 Atherosclerotic heart disease of native coronary artery with unspecified angina pectoris: Secondary | ICD-10-CM | POA: Diagnosis not present

## 2017-03-18 DIAGNOSIS — I5022 Chronic systolic (congestive) heart failure: Secondary | ICD-10-CM | POA: Diagnosis not present

## 2017-03-18 DIAGNOSIS — Z7951 Long term (current) use of inhaled steroids: Secondary | ICD-10-CM | POA: Diagnosis not present

## 2017-03-18 DIAGNOSIS — Z794 Long term (current) use of insulin: Secondary | ICD-10-CM | POA: Diagnosis not present

## 2017-03-18 DIAGNOSIS — I2510 Atherosclerotic heart disease of native coronary artery without angina pectoris: Secondary | ICD-10-CM | POA: Diagnosis not present

## 2017-03-18 DIAGNOSIS — E1151 Type 2 diabetes mellitus with diabetic peripheral angiopathy without gangrene: Secondary | ICD-10-CM | POA: Diagnosis not present

## 2017-03-18 DIAGNOSIS — E162 Hypoglycemia, unspecified: Secondary | ICD-10-CM | POA: Diagnosis not present

## 2017-03-18 DIAGNOSIS — Z7901 Long term (current) use of anticoagulants: Secondary | ICD-10-CM | POA: Diagnosis not present

## 2017-03-18 DIAGNOSIS — Z7952 Long term (current) use of systemic steroids: Secondary | ICD-10-CM | POA: Diagnosis not present

## 2017-03-18 DIAGNOSIS — I509 Heart failure, unspecified: Secondary | ICD-10-CM | POA: Diagnosis not present

## 2017-03-20 DIAGNOSIS — Z79891 Long term (current) use of opiate analgesic: Secondary | ICD-10-CM | POA: Diagnosis not present

## 2017-03-20 DIAGNOSIS — E1151 Type 2 diabetes mellitus with diabetic peripheral angiopathy without gangrene: Secondary | ICD-10-CM | POA: Diagnosis not present

## 2017-03-20 DIAGNOSIS — Z952 Presence of prosthetic heart valve: Secondary | ICD-10-CM | POA: Diagnosis not present

## 2017-03-20 DIAGNOSIS — Z7951 Long term (current) use of inhaled steroids: Secondary | ICD-10-CM | POA: Diagnosis not present

## 2017-03-20 DIAGNOSIS — I110 Hypertensive heart disease with heart failure: Secondary | ICD-10-CM | POA: Diagnosis not present

## 2017-03-20 DIAGNOSIS — Z794 Long term (current) use of insulin: Secondary | ICD-10-CM | POA: Diagnosis not present

## 2017-03-20 DIAGNOSIS — J440 Chronic obstructive pulmonary disease with acute lower respiratory infection: Secondary | ICD-10-CM | POA: Diagnosis not present

## 2017-03-20 DIAGNOSIS — Z7901 Long term (current) use of anticoagulants: Secondary | ICD-10-CM | POA: Diagnosis not present

## 2017-03-20 DIAGNOSIS — Z7952 Long term (current) use of systemic steroids: Secondary | ICD-10-CM | POA: Diagnosis not present

## 2017-03-20 DIAGNOSIS — I2510 Atherosclerotic heart disease of native coronary artery without angina pectoris: Secondary | ICD-10-CM | POA: Diagnosis not present

## 2017-03-20 DIAGNOSIS — I5022 Chronic systolic (congestive) heart failure: Secondary | ICD-10-CM | POA: Diagnosis not present

## 2017-03-20 DIAGNOSIS — J189 Pneumonia, unspecified organism: Secondary | ICD-10-CM | POA: Diagnosis not present

## 2017-03-20 DIAGNOSIS — E785 Hyperlipidemia, unspecified: Secondary | ICD-10-CM | POA: Diagnosis not present

## 2017-03-21 DIAGNOSIS — E785 Hyperlipidemia, unspecified: Secondary | ICD-10-CM | POA: Diagnosis not present

## 2017-03-21 DIAGNOSIS — Z7952 Long term (current) use of systemic steroids: Secondary | ICD-10-CM | POA: Diagnosis not present

## 2017-03-21 DIAGNOSIS — Z952 Presence of prosthetic heart valve: Secondary | ICD-10-CM | POA: Diagnosis not present

## 2017-03-21 DIAGNOSIS — Z7901 Long term (current) use of anticoagulants: Secondary | ICD-10-CM | POA: Diagnosis not present

## 2017-03-21 DIAGNOSIS — I2510 Atherosclerotic heart disease of native coronary artery without angina pectoris: Secondary | ICD-10-CM | POA: Diagnosis not present

## 2017-03-21 DIAGNOSIS — I110 Hypertensive heart disease with heart failure: Secondary | ICD-10-CM | POA: Diagnosis not present

## 2017-03-21 DIAGNOSIS — J440 Chronic obstructive pulmonary disease with acute lower respiratory infection: Secondary | ICD-10-CM | POA: Diagnosis not present

## 2017-03-21 DIAGNOSIS — I5022 Chronic systolic (congestive) heart failure: Secondary | ICD-10-CM | POA: Diagnosis not present

## 2017-03-21 DIAGNOSIS — Z79891 Long term (current) use of opiate analgesic: Secondary | ICD-10-CM | POA: Diagnosis not present

## 2017-03-21 DIAGNOSIS — J189 Pneumonia, unspecified organism: Secondary | ICD-10-CM | POA: Diagnosis not present

## 2017-03-21 DIAGNOSIS — E1151 Type 2 diabetes mellitus with diabetic peripheral angiopathy without gangrene: Secondary | ICD-10-CM | POA: Diagnosis not present

## 2017-03-21 DIAGNOSIS — Z7951 Long term (current) use of inhaled steroids: Secondary | ICD-10-CM | POA: Diagnosis not present

## 2017-03-21 DIAGNOSIS — Z794 Long term (current) use of insulin: Secondary | ICD-10-CM | POA: Diagnosis not present

## 2017-03-24 DIAGNOSIS — E1151 Type 2 diabetes mellitus with diabetic peripheral angiopathy without gangrene: Secondary | ICD-10-CM | POA: Diagnosis not present

## 2017-03-24 DIAGNOSIS — I110 Hypertensive heart disease with heart failure: Secondary | ICD-10-CM | POA: Diagnosis not present

## 2017-03-24 DIAGNOSIS — J440 Chronic obstructive pulmonary disease with acute lower respiratory infection: Secondary | ICD-10-CM | POA: Diagnosis not present

## 2017-03-24 DIAGNOSIS — Z952 Presence of prosthetic heart valve: Secondary | ICD-10-CM | POA: Diagnosis not present

## 2017-03-24 DIAGNOSIS — E785 Hyperlipidemia, unspecified: Secondary | ICD-10-CM | POA: Diagnosis not present

## 2017-03-24 DIAGNOSIS — I5022 Chronic systolic (congestive) heart failure: Secondary | ICD-10-CM | POA: Diagnosis not present

## 2017-03-24 DIAGNOSIS — Z794 Long term (current) use of insulin: Secondary | ICD-10-CM | POA: Diagnosis not present

## 2017-03-24 DIAGNOSIS — Z79891 Long term (current) use of opiate analgesic: Secondary | ICD-10-CM | POA: Diagnosis not present

## 2017-03-24 DIAGNOSIS — Z7901 Long term (current) use of anticoagulants: Secondary | ICD-10-CM | POA: Diagnosis not present

## 2017-03-24 DIAGNOSIS — I2510 Atherosclerotic heart disease of native coronary artery without angina pectoris: Secondary | ICD-10-CM | POA: Diagnosis not present

## 2017-03-24 DIAGNOSIS — Z7951 Long term (current) use of inhaled steroids: Secondary | ICD-10-CM | POA: Diagnosis not present

## 2017-03-24 DIAGNOSIS — J189 Pneumonia, unspecified organism: Secondary | ICD-10-CM | POA: Diagnosis not present

## 2017-03-24 DIAGNOSIS — Z7952 Long term (current) use of systemic steroids: Secondary | ICD-10-CM | POA: Diagnosis not present

## 2017-03-25 DIAGNOSIS — Z7901 Long term (current) use of anticoagulants: Secondary | ICD-10-CM | POA: Diagnosis not present

## 2017-03-25 DIAGNOSIS — Z7951 Long term (current) use of inhaled steroids: Secondary | ICD-10-CM | POA: Diagnosis not present

## 2017-03-25 DIAGNOSIS — E1151 Type 2 diabetes mellitus with diabetic peripheral angiopathy without gangrene: Secondary | ICD-10-CM | POA: Diagnosis not present

## 2017-03-25 DIAGNOSIS — Z794 Long term (current) use of insulin: Secondary | ICD-10-CM | POA: Diagnosis not present

## 2017-03-25 DIAGNOSIS — Z952 Presence of prosthetic heart valve: Secondary | ICD-10-CM | POA: Diagnosis not present

## 2017-03-25 DIAGNOSIS — J440 Chronic obstructive pulmonary disease with acute lower respiratory infection: Secondary | ICD-10-CM | POA: Diagnosis not present

## 2017-03-25 DIAGNOSIS — Z79891 Long term (current) use of opiate analgesic: Secondary | ICD-10-CM | POA: Diagnosis not present

## 2017-03-25 DIAGNOSIS — E785 Hyperlipidemia, unspecified: Secondary | ICD-10-CM | POA: Diagnosis not present

## 2017-03-25 DIAGNOSIS — I2510 Atherosclerotic heart disease of native coronary artery without angina pectoris: Secondary | ICD-10-CM | POA: Diagnosis not present

## 2017-03-25 DIAGNOSIS — J189 Pneumonia, unspecified organism: Secondary | ICD-10-CM | POA: Diagnosis not present

## 2017-03-25 DIAGNOSIS — Z7952 Long term (current) use of systemic steroids: Secondary | ICD-10-CM | POA: Diagnosis not present

## 2017-03-25 DIAGNOSIS — I110 Hypertensive heart disease with heart failure: Secondary | ICD-10-CM | POA: Diagnosis not present

## 2017-03-25 DIAGNOSIS — I5022 Chronic systolic (congestive) heart failure: Secondary | ICD-10-CM | POA: Diagnosis not present

## 2017-03-26 DIAGNOSIS — Z7952 Long term (current) use of systemic steroids: Secondary | ICD-10-CM | POA: Diagnosis not present

## 2017-03-26 DIAGNOSIS — Z7951 Long term (current) use of inhaled steroids: Secondary | ICD-10-CM | POA: Diagnosis not present

## 2017-03-26 DIAGNOSIS — Z7901 Long term (current) use of anticoagulants: Secondary | ICD-10-CM | POA: Diagnosis not present

## 2017-03-26 DIAGNOSIS — Z79891 Long term (current) use of opiate analgesic: Secondary | ICD-10-CM | POA: Diagnosis not present

## 2017-03-26 DIAGNOSIS — E785 Hyperlipidemia, unspecified: Secondary | ICD-10-CM | POA: Diagnosis not present

## 2017-03-26 DIAGNOSIS — J440 Chronic obstructive pulmonary disease with acute lower respiratory infection: Secondary | ICD-10-CM | POA: Diagnosis not present

## 2017-03-26 DIAGNOSIS — I5022 Chronic systolic (congestive) heart failure: Secondary | ICD-10-CM | POA: Diagnosis not present

## 2017-03-26 DIAGNOSIS — Z794 Long term (current) use of insulin: Secondary | ICD-10-CM | POA: Diagnosis not present

## 2017-03-26 DIAGNOSIS — I2510 Atherosclerotic heart disease of native coronary artery without angina pectoris: Secondary | ICD-10-CM | POA: Diagnosis not present

## 2017-03-26 DIAGNOSIS — J189 Pneumonia, unspecified organism: Secondary | ICD-10-CM | POA: Diagnosis not present

## 2017-03-26 DIAGNOSIS — E1151 Type 2 diabetes mellitus with diabetic peripheral angiopathy without gangrene: Secondary | ICD-10-CM | POA: Diagnosis not present

## 2017-03-26 DIAGNOSIS — Z952 Presence of prosthetic heart valve: Secondary | ICD-10-CM | POA: Diagnosis not present

## 2017-03-26 DIAGNOSIS — I110 Hypertensive heart disease with heart failure: Secondary | ICD-10-CM | POA: Diagnosis not present

## 2017-03-28 DIAGNOSIS — Z79891 Long term (current) use of opiate analgesic: Secondary | ICD-10-CM | POA: Diagnosis not present

## 2017-03-28 DIAGNOSIS — I5022 Chronic systolic (congestive) heart failure: Secondary | ICD-10-CM | POA: Diagnosis not present

## 2017-03-28 DIAGNOSIS — Z952 Presence of prosthetic heart valve: Secondary | ICD-10-CM | POA: Diagnosis not present

## 2017-03-28 DIAGNOSIS — Z7901 Long term (current) use of anticoagulants: Secondary | ICD-10-CM | POA: Diagnosis not present

## 2017-03-28 DIAGNOSIS — I2510 Atherosclerotic heart disease of native coronary artery without angina pectoris: Secondary | ICD-10-CM | POA: Diagnosis not present

## 2017-03-28 DIAGNOSIS — E1151 Type 2 diabetes mellitus with diabetic peripheral angiopathy without gangrene: Secondary | ICD-10-CM | POA: Diagnosis not present

## 2017-03-28 DIAGNOSIS — Z7952 Long term (current) use of systemic steroids: Secondary | ICD-10-CM | POA: Diagnosis not present

## 2017-03-28 DIAGNOSIS — J440 Chronic obstructive pulmonary disease with acute lower respiratory infection: Secondary | ICD-10-CM | POA: Diagnosis not present

## 2017-03-28 DIAGNOSIS — Z7951 Long term (current) use of inhaled steroids: Secondary | ICD-10-CM | POA: Diagnosis not present

## 2017-03-28 DIAGNOSIS — Z794 Long term (current) use of insulin: Secondary | ICD-10-CM | POA: Diagnosis not present

## 2017-03-28 DIAGNOSIS — E785 Hyperlipidemia, unspecified: Secondary | ICD-10-CM | POA: Diagnosis not present

## 2017-03-28 DIAGNOSIS — I110 Hypertensive heart disease with heart failure: Secondary | ICD-10-CM | POA: Diagnosis not present

## 2017-03-28 DIAGNOSIS — J189 Pneumonia, unspecified organism: Secondary | ICD-10-CM | POA: Diagnosis not present

## 2017-04-07 DIAGNOSIS — J449 Chronic obstructive pulmonary disease, unspecified: Secondary | ICD-10-CM | POA: Diagnosis not present

## 2017-04-07 DIAGNOSIS — M1991 Primary osteoarthritis, unspecified site: Secondary | ICD-10-CM | POA: Diagnosis not present

## 2017-04-08 ENCOUNTER — Emergency Department (HOSPITAL_COMMUNITY): Payer: Medicare Other

## 2017-04-08 ENCOUNTER — Encounter (HOSPITAL_COMMUNITY): Payer: Self-pay | Admitting: Cardiology

## 2017-04-08 ENCOUNTER — Other Ambulatory Visit: Payer: Self-pay

## 2017-04-08 ENCOUNTER — Emergency Department (HOSPITAL_COMMUNITY)
Admission: EM | Admit: 2017-04-08 | Discharge: 2017-04-08 | Disposition: A | Discharge status: Home / Self Care | Payer: Medicare Other | Attending: Emergency Medicine | Admitting: Emergency Medicine

## 2017-04-08 DIAGNOSIS — R918 Other nonspecific abnormal finding of lung field: Secondary | ICD-10-CM | POA: Insufficient documentation

## 2017-04-08 DIAGNOSIS — R059 Cough, unspecified: Secondary | ICD-10-CM

## 2017-04-08 DIAGNOSIS — Z87891 Personal history of nicotine dependence: Secondary | ICD-10-CM | POA: Diagnosis not present

## 2017-04-08 DIAGNOSIS — F17220 Nicotine dependence, chewing tobacco, uncomplicated: Secondary | ICD-10-CM | POA: Diagnosis not present

## 2017-04-08 DIAGNOSIS — E119 Type 2 diabetes mellitus without complications: Secondary | ICD-10-CM | POA: Insufficient documentation

## 2017-04-08 DIAGNOSIS — R0602 Shortness of breath: Secondary | ICD-10-CM | POA: Diagnosis not present

## 2017-04-08 DIAGNOSIS — G894 Chronic pain syndrome: Secondary | ICD-10-CM | POA: Diagnosis not present

## 2017-04-08 DIAGNOSIS — I110 Hypertensive heart disease with heart failure: Secondary | ICD-10-CM | POA: Diagnosis not present

## 2017-04-08 DIAGNOSIS — Z7901 Long term (current) use of anticoagulants: Secondary | ICD-10-CM | POA: Diagnosis not present

## 2017-04-08 DIAGNOSIS — Z794 Long term (current) use of insulin: Secondary | ICD-10-CM | POA: Diagnosis not present

## 2017-04-08 DIAGNOSIS — J441 Chronic obstructive pulmonary disease with (acute) exacerbation: Secondary | ICD-10-CM | POA: Insufficient documentation

## 2017-04-08 DIAGNOSIS — Z951 Presence of aortocoronary bypass graft: Secondary | ICD-10-CM | POA: Diagnosis not present

## 2017-04-08 DIAGNOSIS — I5022 Chronic systolic (congestive) heart failure: Secondary | ICD-10-CM | POA: Diagnosis not present

## 2017-04-08 DIAGNOSIS — R05 Cough: Secondary | ICD-10-CM | POA: Insufficient documentation

## 2017-04-08 DIAGNOSIS — Z79899 Other long term (current) drug therapy: Secondary | ICD-10-CM | POA: Diagnosis not present

## 2017-04-08 DIAGNOSIS — R0600 Dyspnea, unspecified: Secondary | ICD-10-CM | POA: Diagnosis not present

## 2017-04-08 DIAGNOSIS — I2510 Atherosclerotic heart disease of native coronary artery without angina pectoris: Secondary | ICD-10-CM | POA: Insufficient documentation

## 2017-04-08 DIAGNOSIS — J189 Pneumonia, unspecified organism: Secondary | ICD-10-CM | POA: Diagnosis not present

## 2017-04-08 DIAGNOSIS — I700 Atherosclerosis of aorta: Secondary | ICD-10-CM | POA: Diagnosis not present

## 2017-04-08 LAB — CBC WITH DIFFERENTIAL/PLATELET
BASOS ABS: 0.1 10*3/uL (ref 0.0–0.1)
Basophils Relative: 1 %
EOS ABS: 0.1 10*3/uL (ref 0.0–0.7)
Eosinophils Relative: 2 %
HCT: 40 % (ref 39.0–52.0)
HEMOGLOBIN: 13 g/dL (ref 13.0–17.0)
LYMPHS ABS: 1.8 10*3/uL (ref 0.7–4.0)
Lymphocytes Relative: 24 %
MCH: 30 pg (ref 26.0–34.0)
MCHC: 32.5 g/dL (ref 30.0–36.0)
MCV: 92.2 fL (ref 78.0–100.0)
Monocytes Absolute: 1.2 10*3/uL — ABNORMAL HIGH (ref 0.1–1.0)
Monocytes Relative: 16 %
NEUTROS PCT: 57 %
Neutro Abs: 4.2 10*3/uL (ref 1.7–7.7)
Platelets: 303 10*3/uL (ref 150–400)
RBC: 4.34 MIL/uL (ref 4.22–5.81)
RDW: 15.8 % — ABNORMAL HIGH (ref 11.5–15.5)
WBC: 7.2 10*3/uL (ref 4.0–10.5)

## 2017-04-08 LAB — COMPREHENSIVE METABOLIC PANEL
ALBUMIN: 3.1 g/dL — AB (ref 3.5–5.0)
ALK PHOS: 30 U/L — AB (ref 38–126)
ALT: 22 U/L (ref 17–63)
AST: 41 U/L (ref 15–41)
Anion gap: 8 (ref 5–15)
BUN: 14 mg/dL (ref 6–20)
CALCIUM: 8.9 mg/dL (ref 8.9–10.3)
CHLORIDE: 104 mmol/L (ref 101–111)
CO2: 27 mmol/L (ref 22–32)
CREATININE: 0.82 mg/dL (ref 0.61–1.24)
GFR calc non Af Amer: >60 mL/min (ref >60)
GLUCOSE: 89 mg/dL (ref 65–99)
Potassium: 4 mmol/L (ref 3.5–5.1)
SODIUM: 139 mmol/L (ref 135–145)
Total Bilirubin: 0.8 mg/dL (ref 0.3–1.2)
Total Protein: 6.7 g/dL (ref 6.5–8.1)

## 2017-04-08 LAB — BRAIN NATRIURETIC PEPTIDE: B Natriuretic Peptide: 293 pg/mL — ABNORMAL HIGH (ref 0.0–100.0)

## 2017-04-08 LAB — PROTIME-INR
INR: 2.36
Prothrombin Time: 26.2 seconds — ABNORMAL HIGH (ref 11.4–15.2)

## 2017-04-08 MED ORDER — AMOXICILLIN-POT CLAVULANATE 875-125 MG PO TABS: 1.0000 | ORAL_TABLET | Freq: Two times a day (BID) | ORAL | 0 refills | Status: DC

## 2017-04-08 MED ORDER — PREDNISONE 20 MG PO TABS
40.0000 mg | ORAL_TABLET | Freq: Once | ORAL | Status: AC
  Administered 2017-04-08: 40 mg via ORAL
  Filled 2017-04-08: qty 2

## 2017-04-08 MED ORDER — DEXTROSE 5 % IV SOLN
1.0000 g | Freq: Once | INTRAVENOUS | Status: AC
  Administered 2017-04-08: 1 g via INTRAVENOUS
  Filled 2017-04-08: qty 10

## 2017-04-08 MED ORDER — IOPAMIDOL (ISOVUE-370) INJECTION 76%
100.0000 mL | Freq: Once | INTRAVENOUS | Status: AC | PRN
  Administered 2017-04-08: 100 mL via INTRAVENOUS

## 2017-04-08 MED ORDER — DEXTROSE 5 % IV SOLN
500.0000 mg | INTRAVENOUS | Status: DC
  Administered 2017-04-08: 500 mg via INTRAVENOUS
  Filled 2017-04-08: qty 500

## 2017-04-08 MED ORDER — IPRATROPIUM-ALBUTEROL 0.5-2.5 (3) MG/3ML IN SOLN
3.0000 mL | Freq: Once | RESPIRATORY_TRACT | Status: AC
  Administered 2017-04-08: 3 mL via RESPIRATORY_TRACT
  Filled 2017-04-08: qty 3

## 2017-04-08 MED ORDER — PREDNISONE 10 MG PO TABS: 40.0000 mg | ORAL_TABLET | Freq: Every day | ORAL | 0 refills | Status: DC

## 2017-04-08 MED ORDER — AMOXICILLIN-POT CLAVULANATE 875-125 MG PO TABS
1.0000 | ORAL_TABLET | Freq: Once | ORAL | Status: DC
Start: 2017-04-08 — End: 2017-04-08

## 2017-04-08 NOTE — ED Provider Notes (Signed)
[facility] DEPT Provider Note   CSN: [id number] Arrival date & time: 04/08/17  1530     History   Chief Complaint Chief Complaint  Patient presents with  . Cough    HPI Tanner Pena is a 76 y.o. male.  The history is provided by the patient.  Cough  This is a recurrent problem. The current episode started more than 1 week ago. The problem occurs constantly. The problem has not changed since onset.The cough is productive of sputum. There has been no fever. Associated symptoms include shortness of breath. Pertinent negatives include no chills, no sweats, no rhinorrhea and no sore throat.    76 year old male who presets with cough and shortness of breath.  He has a history of CAD, CHF EF 20-25%, AVR on coumadin, PVD. Discharged from hospital 7/25 after tx for CAP. Since discharge with persistent cough, sputum, weakness, and DOE. Had had on and off swelling of lower extremity. Has had trouble sleeping at night due to dyspnea. W/ chest wall pain with coughing. No fever or chills since discharge. Using inhalers at home without improvement in symptoms.     Past Medical History:  Diagnosis Date  . Acute blood loss anemia 02/2015 & 05/2015   Hemoglobin 5.5 on 05/31/15; status post transfusion.  . Arthritis   . CAD (coronary artery disease)    a. 05/13/14 [name] s/p overlapping DESx2 to SVG to RCA. b.  s/p CABG '90 with redo '94 & stent to RCA SVG in 2005  . Chronic back pain   . Chronic systolic heart failure ([place])   . Chronic toe ulcer ([place])    a. Left foot  . COPD (chronic obstructive pulmonary disease) ([place])   . Critical lower limb ischemia   . Dysphagia   . Essential hypertension   . GERD (gastroesophageal reflux disease)   . GI bleeding 05/31/2015   Source not identified.  . Gout   . History of kidney stones   . Hypercholesteremia   . Myocardial infarction ([place])   . Neuromuscular disorder ([place])   . Peripheral neuropathy   . Peripheral vascular disease ([place])    Lower  extremity PCI/stenting  . S/P aortic valve replacement 1990   a. St. Jude  . Sleep apnea   . Type 2 diabetes mellitus ([place]) 2007    Patient Active Problem List   Diagnosis Date Noted  . HCAP (healthcare-associated pneumonia) 03/08/2017  . Sepsis ([place]) 03/08/2017  . GERD (gastroesophageal reflux disease) 11/28/2016  . COPD exacerbation ([place]) 12/06/2015  . Acute on chronic systolic congestive heart failure ([place])   . Frequent PVCs 10/05/2015  . Cardiomyopathy, ischemic 10/05/2015  . AVM (arteriovenous malformation)   . Acute gastric ulcer   . S/P AVR (aortic valve replacement)   . Controlled type 2 diabetes mellitus without complication ([place])   . Chronic gastric ulcer   . Gastric AVM   . Coronary artery disease involving native coronary artery of native heart without angina pectoris   . AKI (acute kidney injury) ([place]) 10/01/2015  . UGIB (upper gastrointestinal bleed) 09/30/2015  . Chest pain 07/29/2015  . CAD in native artery 07/29/2015  . Gastritis and gastroduodenitis   . Melena 06/20/2015  . GIB (gastrointestinal bleeding) 06/20/2015  . Absolute anemia   . Bleeding gastrointestinal   . History of coronary artery stent placement   . Anticoagulation management encounter   . GI bleed 06/14/2015  . Essential hypertension 06/14/2015  . Iron deficiency anemia due to chronic blood loss   .  Diverticulosis 06/01/2015  . Anemia, iron deficiency   . Chronic obstructive pulmonary disease ([place])   . Type 2 diabetes mellitus with stage 1 chronic kidney disease, without long-term current use of insulin ([place])   . GI bleeding 05/31/2015  . 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 Acute kidney injury ([place]) 05/30/2015  . Coronary artery disease involving native coronary artery of native heart with angina pectoris ([place])   . 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 Chronic systolic heart failure ([place]) 03/05/2015  . Mesenteric ischemia, chronic ([place])   . Acute blood loss anemia 03/02/2015   . Elevated INR 03/02/2015  . Chronic mesenteric ischemia ([place])   . IDA (iron deficiency anemia)   . Chronic anticoagulation   . Enteritis 03/01/2015  . Other noninfectious gastroenteritis   . Abnormal CT of the abdomen 02/17/2015  . Abdominal pain   . Acute respiratory failure with hypoxia ([place])   . Iron deficiency anemia   . Systolic CHF, acute on chronic (HCC)   . NSTEMI (non-ST elevated myocardial infarction) ([place])   . CHF exacerbation ([place]) 02/04/2015  . SOB (shortness of breath)   . Acute respiratory failure ([place]) 12/28/2014  . CAP (community acquired pneumonia) 12/28/2014  . COPD with exacerbation ([place]) 12/28/2014  . Dehiscence of operative wound post left below knee amputation 10/13/2014  . 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 Diabetic peripheral angiopathy ([place]) 09/04/2014  . Below knee amputation status ([place]) 08/20/2014  . 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 Chronic toe ulcer ([place])   . Warfarin-induced coagulopathy ([place]) 05/08/2014  . HTN (hypertension) 05/08/2014  . PVC's (premature ventricular contractions) 05/08/2014  . Unstable angina ([place]) 05/07/2014  . First degree heart block 11/27/2013  . Acute on chronic combined systolic and diastolic CHF 94/85/4627  . COPD (chronic obstructive pulmonary disease) ([place]) 08/11/2013  . LBBB (left bundle branch block) 08/11/2013  . Critical lower limb ischemia 03/04/2013  . CAD - CABG '90 with re do '94  01/05/2013  . Tobacco abuse 01/05/2013  . PVD prior SFA PTA with chronic LE disease, not amenable to PTA 01/05/2013  . DM2 (diabetes mellitus, type 2) ([place]) 01/05/2013  . Accelerated hypertension on admission 01/05/2013  . Carotid stenosis 01/05/2013  . Hyperlipidemia with target LDL less than 70 01/05/2013  . Long term current use of anticoagulant therapy 11/11/2012  . H/O aortic valve replacement- St Jude 11/11/2012  . Colon cancer screening 05/02/2011  . Esophageal dysphagia 05/01/2011  .  CLOSED FRACTURE OF METATARSAL BONE 09/17/2010    Past Surgical History:  Procedure Laterality Date  . AMPUTATION Left 07/13/2014   Procedure: Transmetatarsal Amputation;  Surgeon: [name], MD;  Location: [facility];  Service: Orthopedics;  Laterality: Left;  . AMPUTATION Left 08/20/2014   Procedure: Revision Transmetatarsal Amputation versus Below Knee Amputation;  Surgeon: [name], MD;  Location: [facility];  Service: Orthopedics;  Laterality: Left;  . ANGIOPLASTY ILLIAC ARTERY    . AORTIC VALVE REPLACEMENT  1990   St. Jude  . BACK SURGERY  [phone]   2  . BIOPSY N/A 06/20/2015   Procedure: BIOPSY;  Surgeon: Danie Binder, MD;  Location: [facility];  Service: Endoscopy;  Laterality: N/A;  . CATARACT EXTRACTION     bilateral  . COLONOSCOPY  05/2011   Dr. [name]: benign rectal polyp, left sided tics, ascending colonic ulcers (path c/w ischemia)  . COLONOSCOPY WITH PROPOFOL N/A 06/01/2015   RMR: normal appearing rectal mucosa. Scattered  left-sided diverticula. the remainder of the colonic mucosa appeared normal. the distal 5 cm of terminal ileal mucosa also appeared normal. retroflexion was performed.   . CORONARY ARTERY BYPASS GRAFT  1994   6 vessels  . CORONARY STENT PLACEMENT  2005   RCA vein graft A 3.0x13.0 TAXUS stent was then placed int he vessel a Viva 3.0x4.0 (perfusion balloon was made ready it was placed through the entire lenght of the stent  . ESOPHAGOGASTRODUODENOSCOPY N/A 02/09/2015   DR. Schooler: Normal EGD  . ESOPHAGOGASTRODUODENOSCOPY  05/2011   Dr. [name]: s/p esophageal dilation, antral erosions/nodularity with benign biopsies  . ESOPHAGOGASTRODUODENOSCOPY N/A 10/04/2015   Jane: 1 cm gastric cardia ulcer, AVMs in gastric body s/p APC therapy, 4 mm duodenal ulcer in 2nd portion of duodenum  . ESOPHAGOGASTRODUODENOSCOPY (EGD) WITH PROPOFOL N/A 06/20/2015   Procedure: ESOPHAGOGASTRODUODENOSCOPY (EGD) WITH PROPOFOL;  Surgeon: Danie Binder, MD;  Location: [facility];   Service: Endoscopy;  Laterality: N/A;  . EYE SURGERY    . GIVENS CAPSULE STUDY N/A 06/08/2015   Procedure: GIVENS CAPSULE STUDY;  Surgeon: [name], MD;  [facility];  Service: Endoscopy;  Laterality: N/A;  0700   . IR GENERIC HISTORICAL  06/06/2016   IR RADIOLOGIST EVAL & MGMT 06/06/2016 Corrie Mckusick, DO [facility] INTERV RAD  . LEFT HEART CATHETERIZATION WITH CORONARY ANGIOGRAM N/A 05/11/2014   Procedure: LEFT HEART CATHETERIZATION WITH CORONARY ANGIOGRAM;  Surgeon: [name], MD;  Location: [facility] CATH LAB;  Service: Cardiovascular;  Laterality: N/A;  . LOWER EXTREMITY ANGIOGRAM N/A 02/15/2013   Procedure: LOWER EXTREMITY ANGIOGRAM;  Surgeon: [name], MD;  Location: [facility] LAB;  Service: Cardiovascular;  Laterality: N/A;  . LOWER EXTREMITY ANGIOGRAM N/A 06/06/2014   Procedure: LOWER EXTREMITY ANGIOGRAM;  Surgeon: [name], MD;  Location: [facility] CATH LAB;  Service: Cardiovascular;  Laterality: N/A;  . [name]  06/13/2011   Procedure: Venia Minks DILATION;  Surgeon: [name], MD;  Location: [facility];  Service: Endoscopy;  Laterality: N/A;  Dilated to 56.   [name] PERCUTANEOUS CORONARY STENT INTERVENTION (PCI-S) N/A 05/13/2014   Procedure: PERCUTANEOUS CORONARY STENT INTERVENTION (PCI-S);  Surgeon: [name], MD;  Location: [facility] CATH LAB;  Service: Cardiovascular;  Laterality: N/A;  . Peripheral vascular procedures lower extremities     Right external iliac  artery PTA and stenting as well as bilateral SFA intervention remotely. Repeat procedures in 2011 bilaterally  . ROTATOR CUFF REPAIR     right  . STUMP REVISION Left 09/23/2014   Procedure: Revision Left Below Knee Amputation;  Surgeon: [name], MD;  Location: [facility];  Service: Orthopedics;  Laterality: Left;  . STUMP REVISION Left 10/13/2014   Procedure: REVISION LEFT BELOW KNEE AMPUTATION STUMP;  Surgeon: [name], MD;  Location: [facility];  Service: Orthopedics;  Laterality:  Left;       Home Medications    Prior to Admission medications   Medication Sig Start Date End Date Taking? Authorizing Provider  albuterol (PROVENTIL) 4 MG tablet Take 4 mg by mouth 2 (two) times daily.  01/20/15  Yes [provider]  allopurinol (ZYLOPRIM) 300 MG tablet Take 300 mg by mouth daily.   Yes [provider]  cholecalciferol (VITAMIN D) 1000 UNITS tablet Take 2,000 Units by mouth daily.   Yes [provider]  COMBIVENT RESPIMAT 20-100 MCG/ACT AERS respimat Inhale 1 puff into the lungs every 6 (six) hours as needed for wheezing or shortness of  breath.  02/18/17  Yes [provider]  dexlansoprazole (DEXILANT) 60 MG capsule Take 1 capsule (60 mg total) by mouth daily. 10/23/16  Yes Annitta Needs, NP  fenofibrate (TRICOR) 145 MG tablet TAKE 1 TABLET BY MOUTH ONCE DAILY FOR CHOLESTEROL. 03/14/17  Yes Troy Sine, MD  fish oil-omega-3 fatty acids 1000 MG capsule Take 1 capsule (1 g total) by mouth 2 (two) times daily. 01/22/13  Yes Alvstad, Kristin L, RPH-CPP  furosemide (LASIX) 80 MG tablet Take 1 tablet (80 mg total) by mouth daily. 12/07/15  Yes Kathie Dike, MD  glimepiride (AMARYL) 2 MG tablet Take 2 mg by mouth 2 (two) times daily. 03/20/17  Yes [provider]  Insulin Detemir (LEVEMIR) 100 UNIT/ML Pen Inject 20 Units into the skin daily. Patient taking differently: Inject 30-40 Units into the skin 2 (two) times daily.  12/07/15  Yes Kathie Dike, MD  isosorbide mononitrate (IMDUR) 60 MG 24 hr tablet TAKE ONE TABLET BY MOUTH ONCE DAILY. 06/12/16  Yes Troy Sine, MD  metoprolol succinate (TOPROL-XL) 25 MG 24 hr tablet TAKE ONE TABLET BY MOUTH IN THE MORNING AND 1/2 TABLET IN THE EVENING. 03/14/17  Yes Troy Sine, MD  oxyCODONE-acetaminophen (PERCOCET) 10-325 MG per tablet Take 1 tablet by mouth every 4 (four) hours as needed for pain. Patient taking differently: Take 1 tablet by mouth every 3 (three) hours as needed for pain.   09/23/14  Yes [name], MD  potassium chloride SA (K-DUR,KLOR-CON) 20 MEQ tablet Take 20 mEq by mouth daily.     Yes [provider]  pregabalin (LYRICA) 50 MG capsule Take one capsule by mouth twice daily for pains Patient taking differently: Take 50 mg by mouth 3 (three) times daily.  08/23/14  Yes Blanchie Serve, MD  rosuvastatin (CRESTOR) 20 MG tablet TAKE (1) TABLET BY MOUTH DAILY. 03/14/17  Yes Troy Sine, MD  sitaGLIPtin (JANUVIA) 50 MG tablet Take 50 mg by mouth daily.   Yes [provider]  vitamin C (ASCORBIC ACID) 500 MG tablet Take 500 mg by mouth daily.   Yes [provider]  warfarin (COUMADIN) 5 MG tablet Take 1-1.5 tablets by mouth daily as directed by coumadin clinic Patient taking differently: Take 2.5-5 mg by mouth See admin instructions. Take 5mg  on Mondays and Fridays. Take 2.5mg  on all other days 12/23/16  Yes Troy Sine, MD  amoxicillin-clavulanate (AUGMENTIN) 875-125 MG tablet Take 1 tablet by mouth every 12 (twelve) hours. 04/08/17   Forde Dandy, MD  Insulin Pen Needle 29G X 12.7MM MISC Use as directed 12/07/15   Kathie Dike, MD  NITROSTAT 0.4 MG SL tablet PLACE 1 TAB UNDER TONGUE EVERY 5 MIN IF NEEDED FOR CHEST PAIN. MAY USE 3 TIMES.NO RELIEF CALL 911. 07/20/15   Eileen Stanford, PA-C  predniSONE (DELTASONE) 10 MG tablet Take 1 tablet (10 mg total) by mouth daily with breakfast. Take 6 tablets today and then decrease by 1 tablet daily until none are left. Patient not taking: Reported on 04/08/2017 03/12/17   Isaac Bliss, Rayford Halsted, MD  predniSONE (DELTASONE) 10 MG tablet Take 4 tablets (40 mg total) by mouth daily. 04/08/17   Forde Dandy, MD  ramipril (ALTACE) 2.5 MG capsule TAKE ONE CAPSULE BY MOUTH DAILY. Patient not taking: Reported on 04/08/2017 03/14/17   Troy Sine, MD  ramipril (ALTACE) 5 MG capsule TAKE 1 CAPSULE BY MOUTH EVERY MORNING. Patient not taking: Reported on 04/08/2017 03/14/17  Troy Sine, MD     Family History Family History  Problem Relation Age of Onset  . Diabetes Father   . Heart failure Other   . Colon cancer Neg Hx   . Liver disease Neg Hx     Social History Social History  Substance Use Topics  . Smoking status: Former Smoker    Packs/day: 1.00    Years: 55.00    Types: Cigarettes    Start date: 08/19/1953    Quit date: 05/07/2014  . Smokeless tobacco: Current User    Types: Chew    Last attempt to quit: 08/20/1995     Comment: Has quit on 3 occasions. Counseling given today 5-10 minutes   I am more than likely going to quit "  . Alcohol use No     Comment: Socially, sometimes 12 ounce beer daily, may go month without/no whiskey     Allergies   Patient has no known allergies.   Review of Systems Review of Systems  Constitutional: Negative for chills.  HENT: Positive for congestion. Negative for rhinorrhea and sore throat.   Respiratory: Positive for cough and shortness of breath.   Cardiovascular: Positive for leg swelling.  Gastrointestinal: Negative for abdominal pain, nausea and vomiting.  Hematological: Bruises/bleeds easily.  All other systems reviewed and are negative.    Physical Exam Updated Vital Signs BP (!) 106/57 (BP Location: Left Arm)   Pulse 89   Temp 98.1 F (36.7 C) (Oral)   Resp (!) 24   Ht 5\' 10"  (1.778 m)   Wt 92.1 kg (203 lb)   SpO2 93%   BMI 29.13 kg/m   Physical Exam Physical Exam  Nursing note and vitals reviewed. Constitutional: appears fatigued and low energy, non-toxic, and in no acute distress Head: Normocephalic and atraumatic.  Mouth/Throat: Oropharynx is clear and moist.  Neck: Normal range of motion. Neck supple.  Cardiovascular: Normal rate and regular rhythm.   Pulmonary/Chest: Effort mildly increased. There is coarse breath sounds in all lung fields.  Abdominal: Soft. There is no tenderness. There is no rebound and no guarding.  Musculoskeletal: Left AKA. Right lower extremity pitting edema.   Neurological: Alert, no facial droop, fluent speech, moves all extremities symmetrically Skin: Skin is warm and dry.  Psychiatric: Cooperative   ED Treatments / Results  Labs (all labs ordered are listed, but only abnormal results are displayed) Labs Reviewed  CBC WITH DIFFERENTIAL/PLATELET - Abnormal; Notable for the following:       Result Value   RDW 15.8 (*)    Monocytes Absolute 1.2 (*)    All other components within normal limits  COMPREHENSIVE METABOLIC PANEL - Abnormal; Notable for the following:    Albumin 3.1 (*)    Alkaline Phosphatase 30 (*)    All other components within normal limits  BRAIN NATRIURETIC PEPTIDE - Abnormal; Notable for the following:    B Natriuretic Peptide 293.0 (*)    All other components within normal limits  PROTIME-INR - Abnormal; Notable for the following:    Prothrombin Time 26.2 (*)    All other components within normal limits    EKG  EKG Interpretation None       Radiology Dg Chest 2 View  Result Date: 04/08/2017 CLINICAL DATA:  Productive cough and congestion EXAM: CHEST  2 VIEW COMPARISON:  03/10/2017 FINDINGS: Cardiac shadow is mildly enlarged. Postsurgical changes are again seen. Fullness is noted in the right hilar region although stable from prior exams and consistent with  a prominent pulmonary artery and patient rotation to the right. No focal infiltrate is noted. Chronic changes in the left base are seen. No new focal abnormality is noted. IMPRESSION: Chronic changes in the left base although significantly improved from previous exams. Fullness in the right hilar region consistent with prominent pulmonary artery Electronically Signed   By: Inez Catalina M.D.   On: 04/08/2017 16:14   Ct Angio Chest Pe W And/or Wo Contrast  Result Date: 04/08/2017 CLINICAL DATA:  Shortness of breath.  Cough. EXAM: CT ANGIOGRAPHY CHEST WITH CONTRAST TECHNIQUE: Multidetector CT imaging of the chest was performed using the standard protocol during  bolus administration of intravenous contrast. Multiplanar CT image reconstructions and MIPs were obtained to evaluate the vascular anatomy. CONTRAST:  100 mL Isovue 370 nonionic COMPARISON:  Chest radiograph April 08, 2017 FINDINGS: Cardiovascular: There is no evident pulmonary embolus. Note that there is narrowing of the right upper lobe pulmonary artery branches due to tumor encasement. There is no appreciable thoracic adenopathy or dissection. There is extensive atherosclerotic calcification in the proximal great vessels, most severe in the left subclavian artery were there is hemodynamically significant obstruction approximately 2 cm distal to the origin of this vessel. There is extensive atherosclerotic calcification throughout the aorta. There is extensive native coronary artery calcification. Patient is status post coronary artery bypass grafting. Pericardium is not appreciably thickened. Mediastinum/Nodes: Thyroid appears unremarkable. There is adenopathy at multiple sites in the chest. In the anterior mediastinum, there are several enlarged lymph nodes, largest measuring 2.7 x 2.3 cm. Anterior to the distal trachea, there is a lymph node measuring 2.8 x 2.6 cm. 1.8 x 1.8 cm. To the left of the carina, there is adenopathy measuring 2.4 x 2.5 cm. In the right hilum, there is adenopathy in measuring 2.9 x 2.5 cm. In the sub- carinal region, there are several enlarged lymph nodes. Largest lymph node measures 2.6 x 2.2 cm. A lymph node posterior to the carina more inferiorly on the right measures 2.9 x 1.7 cm. No esophageal lesions are evident. Lungs/Pleura: There is calcification along the posterior parietal pleural on the left consistent with previous asbestos exposure. There is a mass which surrounds right pulmonary artery branches which is felt to arise from the medial aspect of the posterior segment right upper lobe and extends into the hilum. This lesion measures 4.6 x 4.6 cm. In the apex on the right,  there is an irregular mass with a somewhat bubbly central appearance measuring 1.7 x 1.0 cm. This appearance is concerning for neoplastic etiology. There is a nearby 6 mm nodular opacity in the right apex seen on axial slice 16 series 6. On axial slice 29 series 6, there is a 3 mm nodular opacity in the posterior segment of the right upper lobe. There is atelectatic change in both bases, more on the left than on the right. No pleural effusion evident. Upper Abdomen: In the visualized upper abdominal region, there is cholelithiasis. Gallbladder wall does not appear appreciably thickened. There is atherosclerotic calcification right renal artery as well as in the origins of the superior mesenteric and celiac arteries. There is a small mass in the left adrenal measuring 1.1 x 0.7 cm. This small mass has an attenuation value of 34 Hounsfield units. A small metastasis in the left adrenal cannot be excluded. The right adrenal appears unremarkable. Liver is mildly lobular in contour, a finding that potentially could indicate a degree of underlying hepatic cirrhosis. Musculoskeletal: There is degenerative change in the  thoracic spine. There are no evident blastic or lytic bone lesions. Patient is status post median sternotomy. Review of the MIP images confirms the above findings. IMPRESSION: 1.  No evident pulmonary embolus. 2. Right upper lobe mass surrounding and encasing upper lobe pulmonary artery is. This mass extends into the right superior hilar region. This mass is consistent with neoplasm. 3. Nodular lesion with somewhat bubbly central appearance in the right apex measuring 1.7 x 1.0 cm. Suspect second focus of parenchymal lung neoplasm. 4.  Extensive multifocal adenopathy. 5. 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 6. Extensive aortic atherosclerosis. Patient is status post coronary artery bypass grafting. Hemodynamically significant obstruction in the proximal left subclavian  artery, celiac artery, superior mesenteric artery, and right renal artery. Other areas of atherosclerotic calcification noted. 7.  Cholelithiasis. 8. Contour of the liver raises concern for a degree of underlying hepatic cirrhosis. Aortic Atherosclerosis (ICD10-I70.0). Electronically Signed   By: Lowella Grip III M.D.   On: 04/08/2017 19:40    Procedures Procedures (including critical care time)  Medications Ordered in ED Medications  azithromycin (ZITHROMAX) 500 mg in dextrose 5 % 250 mL IVPB (0 mg Intravenous Stopped 04/08/17 2147)  ipratropium-albuterol (DUONEB) 0.5-2.5 (3) MG/3ML nebulizer solution 3 mL (3 mLs Nebulization Given 04/08/17 1734)  ipratropium-albuterol (DUONEB) 0.5-2.5 (3) MG/3ML nebulizer solution 3 mL (3 mLs Nebulization Given 04/08/17 1932)  iopamidol (ISOVUE-370) 76 % injection 100 mL (100 mLs Intravenous Contrast Given 04/08/17 1907)  predniSONE (DELTASONE) tablet 40 mg (40 mg Oral Given 04/08/17 2007)  cefTRIAXone (ROCEPHIN) 1 g in dextrose 5 % 50 mL IVPB (0 g Intravenous Stopped 04/08/17 2048)     Initial Impression / Assessment and Plan / ED Course  I have reviewed the triage vital signs and the nursing notes.  Pertinent labs & imaging results that were available during my care of the patient were reviewed by me and considered in my medical decision making (see chart for details).     76 year old male who presents with persistent cough and shortness of breath after recent diagnosis of pneumonia. He is chronically ill-appearing, but in no severe respiratory distress. Afebrile and hemodynamically stable. With coarse wheezing on lung exam. Does not look fluid overloaded.  Chest x-ray visualized and does not show any acute cardiopulmonary processes such as pneumonia or edema. Blood work shows no significant leukocytosis, metabolic or electrolyte derangements.   Given clinical concerns for potential persistent pneumonia, CT chest was performed. 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 There is no pneumonia, infiltrate, edema or other acute cardiopulmonary processes.  Of long-standing history of tobacco use, with unofficial diagnosis of COPD. He did receive breathing treatments here in the ED, with subjective improvement in his breathing.  I discussed this patient with Dr. Gerarda Fraction who sent him in earlier for evaluation. He will arrange follow-up for patient regarding workup of his right lung mass. Patient has been able to ambulate with walker, which is his baseline without any hypoxia or significant increased work of breathing. If felt to be appropriate for outpatient management. We'll start him on steroids for potential COPD exacerbation. Dr. Gerarda Fraction also recommended that he receive antibiotics IV and by mouth for home. Plus he did receive a dose of ceftriaxone and azithromycin. Discharged with Augmentin.  Strict return and follow-up instructions reviewed. He and family expressed understanding of all discharge instructions and felt comfortable with the plan of care.   Final Clinical Impressions(s) / ED  Diagnoses   Final diagnoses:  Cough  Lung mass  Acute exacerbation of chronic obstructive pulmonary disease (COPD) [facility])    New Prescriptions Discharge Medication List as of 04/08/2017  9:26 PM       Forde Dandy, MD 04/08/17 2332

## 2017-04-08 NOTE — Discharge Instructions (Signed)
You have a right lung mass that is new.  Dr. Gerarda Fraction will arrange follow-up with you.  We will also treat you for potential COPD with steroids and antibiotics. Please call Dr. Gerarda Fraction for close follow-up. Return for worsening symptoms, including passing out, fever, confusion, worsening breathing or any other symptoms concerning to you.

## 2017-04-08 NOTE — ED Triage Notes (Signed)
Productive cough since being discharged from hospital one month ago.  Was admitted with pneumonia.  Dr. Gerarda Fraction sent him here to be evaluated for pneumonia.

## 2017-04-08 NOTE — ED Notes (Signed)
Pt was only able to ambulate in room. Does not normally walk much at home. Was able to walk in room with O2 sats dropping only from 96%-94%. Was not able to assess for a long time due to pt not being able to stand much normally.

## 2017-04-10 ENCOUNTER — Other Ambulatory Visit: Payer: Self-pay | Admitting: Cardiovascular Disease

## 2017-04-11 DIAGNOSIS — J209 Acute bronchitis, unspecified: Secondary | ICD-10-CM | POA: Diagnosis not present

## 2017-04-11 DIAGNOSIS — J449 Chronic obstructive pulmonary disease, unspecified: Secondary | ICD-10-CM | POA: Diagnosis not present

## 2017-04-11 DIAGNOSIS — Z1389 Encounter for screening for other disorder: Secondary | ICD-10-CM | POA: Diagnosis not present

## 2017-04-11 DIAGNOSIS — D491 Neoplasm of unspecified behavior of respiratory system: Secondary | ICD-10-CM | POA: Diagnosis not present

## 2017-04-11 DIAGNOSIS — C3491 Malignant neoplasm of unspecified part of right bronchus or lung: Secondary | ICD-10-CM | POA: Diagnosis not present

## 2017-04-11 DIAGNOSIS — J9620 Acute and chronic respiratory failure, unspecified whether with hypoxia or hypercapnia: Secondary | ICD-10-CM | POA: Diagnosis not present

## 2017-04-16 ENCOUNTER — Encounter (HOSPITAL_COMMUNITY): Payer: Self-pay | Admitting: Adult Health

## 2017-04-16 ENCOUNTER — Telehealth: Payer: Self-pay | Admitting: Pharmacist Clinician (PhC)/ Clinical Pharmacy Specialist

## 2017-04-16 DIAGNOSIS — J449 Chronic obstructive pulmonary disease, unspecified: Secondary | ICD-10-CM | POA: Diagnosis not present

## 2017-04-16 DIAGNOSIS — I10 Essential (primary) hypertension: Secondary | ICD-10-CM | POA: Diagnosis not present

## 2017-04-16 DIAGNOSIS — J9611 Chronic respiratory failure with hypoxia: Secondary | ICD-10-CM | POA: Diagnosis not present

## 2017-04-16 DIAGNOSIS — R918 Other nonspecific abnormal finding of lung field: Secondary | ICD-10-CM | POA: Diagnosis not present

## 2017-04-16 NOTE — Progress Notes (Signed)
Discussed patient's case with Dr. Laurence Ferrari with IR to get his opinion on what to target for biopsy.  He recommends getting biopsy of (R) cervical station lymph node with ultrasound biopsy if we needed definitive tissue diagnosis quickly.   Will make arrangements for him to come in soon for initial consultation and further recommendations with Dr. Talbert Cage.   Mike Craze, NP [place] [phone]

## 2017-04-16 NOTE — Telephone Encounter (Signed)
Coumadin letter

## 2017-04-22 ENCOUNTER — Encounter (HOSPITAL_COMMUNITY): Payer: Self-pay | Admitting: Oncology

## 2017-04-22 ENCOUNTER — Encounter (HOSPITAL_COMMUNITY): Payer: Medicare Other | Attending: Oncology | Admitting: Oncology

## 2017-04-22 VITALS — BP 114/64 | HR 74 | Resp 20 | Ht 70.0 in | Wt 199.0 lb

## 2017-04-22 DIAGNOSIS — R918 Other nonspecific abnormal finding of lung field: Secondary | ICD-10-CM | POA: Diagnosis not present

## 2017-04-22 DIAGNOSIS — R911 Solitary pulmonary nodule: Secondary | ICD-10-CM | POA: Diagnosis not present

## 2017-04-22 DIAGNOSIS — R519 Headache, unspecified: Secondary | ICD-10-CM

## 2017-04-22 DIAGNOSIS — R51 Headache: Secondary | ICD-10-CM

## 2017-04-22 DIAGNOSIS — G4452 New daily persistent headache (NDPH): Secondary | ICD-10-CM

## 2017-04-22 NOTE — Progress Notes (Signed)
[facility] Initial Visit:  Patient Care Team: Redmond School, MD as PCP - General (Internal Medicine) [name] Cristopher Estimable, MD (Gastroenterology)  CHIEF COMPLAINTS/PURPOSE OF CONSULTATION:  RUL lung mass  HISTORY OF PRESENTING ILLNESS: Tanner Pena 76 y.o. male presents for evaluation of right lung mass. Patient was recently hospitalized from 03/08/2017 through 03/12/2017 for community acquired pneumonia. He then went back to the [facility] ED on 04/08/2017 for cough and shortness of breath. During his visit he underwent a CT angiogram PE protocol and was found to be negative for PE but did show evidence of a 4.6 x 4.6 right upper lobe mass surrounding the right pulmonary artery and extending into the hilum. In the apex on the right there is also another irregular mass with a somewhat bubbly central, measuring 1.7 x 1 cm which is suspected to be a second focus of parenchymal 1 neoplasm. He also has extensive multifocal adenopathy in the lungs. Evidence of previous asbestos exposure with pleural thickening. 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 He was discharged on Augmentin with outpatient follow-up for workup of suspected lung malignancy.  Patient has a history of smoking 2 ppd for 50 years but quit 3 years ago. He also worked in [employer] and has history of asbestos exposure.  He states that he was having severe shortness of breath and was recently put on nasal home oxygen about 1 week ago which has helped with his breathing tremendously. He has lost about 40 lbs in 2 months due to decrease in appetite and "nothing tastes good". He has chronic constipation. He has been having headaches at the base of his skull for 2 weeks. Complains of fatigue. Denies any chest pain, diarrhea.  Review of Systems - Oncology ROS as per HPI otherwise 12 point ROS is negative.  MEDICAL HISTORY: Past Medical History:  Diagnosis Date  . Acute blood loss  anemia 02/2015 & 05/2015   Hemoglobin 5.5 on 05/31/15; status post transfusion.  . Arthritis   . CAD (coronary artery disease)    a. 05/13/14 [name] s/p overlapping DESx2 to SVG to RCA. b.  s/p CABG '90 with redo '94 & stent to RCA SVG in 2005  . Chronic back pain   . Chronic systolic heart failure ([place])   . Chronic toe ulcer ([place])    a. Left foot  . COPD (chronic obstructive pulmonary disease) ([place])   . Critical lower limb ischemia   . Dysphagia   . Essential hypertension   . GERD (gastroesophageal reflux disease)   . GI bleeding 05/31/2015   Source not identified.  . Gout   . History of kidney stones   . Hypercholesteremia   . Myocardial infarction ([place])   . Neuromuscular disorder ([place])   . Peripheral neuropathy   . Peripheral vascular disease ([place])    Lower extremity PCI/stenting  . S/P aortic valve replacement 1990   a. St. Jude  . Sleep apnea   . Type 2 diabetes mellitus ([place]) 2007    SURGICAL HISTORY: Past Surgical History:  Procedure Laterality Date  . AMPUTATION Left 07/13/2014   Procedure: Transmetatarsal Amputation;  Surgeon: [name], MD;  Location: [place];  Service: Orthopedics;  Laterality: Left;  . AMPUTATION Left 08/20/2014   Procedure: Revision Transmetatarsal Amputation versus Below Knee Amputation;  Surgeon: [name], MD;  Location: [place];  Service: Orthopedics;  Laterality: Left;  . ANGIOPLASTY ILLIAC ARTERY    . AORTIC VALVE REPLACEMENT  1990   St. Jude  . BACK SURGERY  [phone]   2  . BIOPSY N/A 06/20/2015   Procedure: BIOPSY;  Surgeon: Danie Binder, MD;  Location: [facility];  Service: Endoscopy;  Laterality: N/A;  . CATARACT EXTRACTION     bilateral  . COLONOSCOPY  05/2011   Dr. [name]: benign rectal polyp, left sided tics, ascending colonic ulcers (path c/w ischemia)  . COLONOSCOPY WITH PROPOFOL N/A 06/01/2015   RMR: normal appearing rectal mucosa. Scattered left-sided diverticula. the remainder of the colonic mucosa appeared normal. the distal  5 cm of terminal ileal mucosa also appeared normal. retroflexion was performed.   . CORONARY ARTERY BYPASS GRAFT  1994   6 vessels  . CORONARY STENT PLACEMENT  2005   RCA vein graft A 3.0x13.0 TAXUS stent was then placed int he vessel a Viva 3.0x4.0 (perfusion balloon was made ready it was placed through the entire lenght of the stent  . ESOPHAGOGASTRODUODENOSCOPY N/A 02/09/2015   DR. Schooler: Normal EGD  . ESOPHAGOGASTRODUODENOSCOPY  05/2011   Dr. [name]: s/p esophageal dilation, antral erosions/nodularity with benign biopsies  . 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 ESOPHAGOGASTRODUODENOSCOPY (EGD) WITH PROPOFOL N/A 06/20/2015   Procedure: ESOPHAGOGASTRODUODENOSCOPY (EGD) WITH PROPOFOL;  Surgeon: Danie Binder, MD;  Location: [facility];  Service: Endoscopy;  Laterality: N/A;  . EYE SURGERY    . GIVENS CAPSULE STUDY N/A 06/08/2015   Procedure: GIVENS CAPSULE STUDY;  Surgeon: [name], MD;  [facility];  Service: Endoscopy;  Laterality: N/A;  0700   . IR GENERIC HISTORICAL  06/06/2016   IR RADIOLOGIST EVAL & MGMT 06/06/2016 Corrie Mckusick, DO [facility] INTERV RAD  . LEFT HEART CATHETERIZATION WITH CORONARY ANGIOGRAM N/A 05/11/2014   Procedure: LEFT HEART CATHETERIZATION WITH CORONARY ANGIOGRAM;  Surgeon: [name], MD;  Location: [facility] CATH LAB;  Service: Cardiovascular;  Laterality: N/A;  . LOWER EXTREMITY ANGIOGRAM N/A 02/15/2013   Procedure: LOWER EXTREMITY ANGIOGRAM;  Surgeon: [name], MD;  Location: [facility] CATH LAB;  Service: Cardiovascular;  Laterality: N/A;  . LOWER EXTREMITY ANGIOGRAM N/A 06/06/2014   Procedure: LOWER EXTREMITY ANGIOGRAM;  Surgeon: [name], MD;  Location: [facility] CATH LAB;  Service: Cardiovascular;  Laterality: N/A;  . [name]  06/13/2011   Procedure: Venia Minks DILATION;  Surgeon: [name], MD;   Location: [facility];  Service: Endoscopy;  Laterality: N/A;  Dilated to 56.   [name] PERCUTANEOUS CORONARY STENT INTERVENTION (PCI-S) N/A 05/13/2014   Procedure: PERCUTANEOUS CORONARY STENT INTERVENTION (PCI-S);  Surgeon: [name], MD;  Location: [facility] CATH LAB;  Service: Cardiovascular;  Laterality: N/A;  . Peripheral vascular procedures lower extremities     Right external iliac  artery PTA and stenting as well as bilateral SFA intervention remotely. Repeat procedures in 2011 bilaterally  . ROTATOR CUFF REPAIR     right  . STUMP REVISION Left 09/23/2014   Procedure: Revision Left Below Knee Amputation;  Surgeon: [name], MD;  Location: [place];  Service: Orthopedics;  Laterality: Left;  . STUMP REVISION Left 10/13/2014   Procedure: REVISION LEFT BELOW KNEE AMPUTATION STUMP;  Surgeon: [name], MD;  Location: [facility];  Service: Orthopedics;  Laterality: Left;    SOCIAL HISTORY: Social History   Social History  . Marital status: Legally Separated    Spouse name: N/A  .  Number of children: 5  . Years of education: N/A   Occupational History  . 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 Smoking status: Former Smoker    Packs/day: 1.00    Years: 55.00    Types: Cigarettes    Start date: 08/19/1953    Quit date: 05/07/2014  . Smokeless tobacco: Current User    Types: Chew    Last attempt to quit: 08/20/1995     Comment: Has quit on 3 occasions. Counseling given today 5-10 minutes   I am more than likely going to quit "  . Alcohol use No     Comment: Socially, sometimes 12 ounce beer daily, may go month without/no whiskey  . Drug use: No  . Sexual activity: Not Currently   Other Topics Concern  . Not on file   Social History Narrative   Has 3 daughters   Has 2 sons    FAMILY HISTORY Family History  Problem Relation Age of Onset  . Diabetes Father   . Heart failure Other   . Colon cancer Neg Hx   . Liver disease Neg Hx      ALLERGIES:  has No Known Allergies.  MEDICATIONS:  Current Outpatient Prescriptions  Medication Sig Dispense Refill  . albuterol (PROVENTIL) 4 MG tablet Take 4 mg by mouth 2 (two) times daily.     [name] allopurinol (ZYLOPRIM) 300 MG tablet Take 300 mg by mouth daily.    [name] amoxicillin-clavulanate (AUGMENTIN) 875-125 MG tablet Take 1 tablet by mouth every 12 (twelve) hours. 14 tablet 0  . cholecalciferol (VITAMIN D) 1000 UNITS tablet Take 2,000 Units by mouth daily.    . COMBIVENT RESPIMAT 20-100 MCG/ACT AERS respimat Inhale 1 puff into the lungs every 6 (six) hours as needed for wheezing or shortness of breath.     . dexlansoprazole (DEXILANT) 60 MG capsule Take 1 capsule (60 mg total) by mouth daily. 90 capsule 3  . fenofibrate (TRICOR) 145 MG tablet TAKE 1 TABLET BY MOUTH ONCE DAILY FOR CHOLESTEROL. 30 tablet 0  . fish oil-omega-3 fatty acids 1000 MG capsule Take 1 capsule (1 g total) by mouth 2 (two) times daily.    . furosemide (LASIX) 80 MG tablet Take 1 tablet (80 mg total) by mouth daily. 30 tablet 0  . glimepiride (AMARYL) 2 MG tablet Take 2 mg by mouth 2 (two) times daily.    . Insulin Detemir (LEVEMIR) 100 UNIT/ML Pen Inject 20 Units into the skin daily. (Patient taking differently: Inject 30-40 Units into the skin 2 (two) times daily. ) 15 mL 11  . Insulin Pen Needle 29G X 12.7MM MISC Use as directed 30 each 0  . isosorbide mononitrate (IMDUR) 60 MG 24 hr tablet TAKE ONE TABLET BY MOUTH ONCE DAILY. 90 tablet 3  . metoprolol succinate (TOPROL-XL) 25 MG 24 hr tablet TAKE ONE TABLET BY MOUTH IN THE MORNING AND 1/2 TABLET IN THE EVENING. 45 tablet 0  . NITROSTAT 0.4 MG SL tablet PLACE 1 TAB UNDER TONGUE EVERY 5 MIN IF NEEDED FOR CHEST PAIN. MAY USE 3 TIMES.NO RELIEF CALL 911. 25 tablet 4  . oxyCODONE-acetaminophen (PERCOCET) 10-325 MG per tablet Take 1 tablet by mouth every 4 (four) hours as needed for pain. (Patient taking differently: Take 1 tablet by mouth every 3 (three) hours as  needed for pain. ) 30 tablet 0  . potassium chloride SA (K-DUR,KLOR-CON) 20 MEQ tablet Take 20 mEq by mouth daily.      [name]  predniSONE (DELTASONE) 10 MG tablet Take 1 tablet (10 mg total) by mouth daily with breakfast. Take 6 tablets today and then decrease by 1 tablet daily until none are left. (Patient not taking: Reported on 04/08/2017) 21 tablet 0  . predniSONE (DELTASONE) 10 MG tablet Take 4 tablets (40 mg total) by mouth daily. 20 tablet 0  . pregabalin (LYRICA) 50 MG capsule Take one capsule by mouth twice daily for pains (Patient taking differently: Take 50 mg by mouth 3 (three) times daily. ) 60 capsule 5  . ramipril (ALTACE) 2.5 MG capsule TAKE ONE CAPSULE BY MOUTH DAILY. 30 capsule 0  . ramipril (ALTACE) 5 MG capsule TAKE 1 CAPSULE BY MOUTH EVERY MORNING. 30 capsule 0  . rosuvastatin (CRESTOR) 20 MG tablet TAKE (1) TABLET BY MOUTH DAILY. 90 tablet 0  . sitaGLIPtin (JANUVIA) 50 MG tablet Take 50 mg by mouth daily.    . vitamin C (ASCORBIC ACID) 500 MG tablet Take 500 mg by mouth daily.    [name] warfarin (COUMADIN) 5 MG tablet Take 1-1.5 tablets by mouth daily as directed by coumadin clinic (Patient taking differently: Take 2.5-5 mg by mouth See admin instructions. Take '5mg'$  on Mondays and Fridays. Take 2.'5mg'$  on all other days) 40 tablet 1   No current facility-administered medications for this visit.     PHYSICAL EXAMINATION:  ECOG PERFORMANCE STATUS: 2 - Symptomatic, <50% confined to bed   Vitals:   04/22/17 1327  BP: 114/64  Pulse: 74  Resp: 20  SpO2: 97%    Filed Weights   04/22/17 1327  Weight: 199 lb (90.3 kg)     Physical Exam Constitutional: Well-developed, well-nourished, and in no distress. Nasal oxygen. Sitting in a wheelchair. HENT:  Head: Normocephalic and atraumatic.  Mouth/Throat: No oropharyngeal exudate. Mucosa moist. Eyes: Pupils are equal, round, and reactive to light. Conjunctivae are normal. No scleral icterus.  Neck: Normal range of motion. Neck supple.  No JVD present.  Cardiovascular: +mechanical click. Normal rate, regular rhythm and normal heart sounds.  Exam reveals no gallop and no friction rub.   No murmur heard. Pulmonary/Chest: Coarse breath sounds bilaterally. No respiratory distress. Abdominal: Soft. Bowel sounds are normal. No distension. There is no tenderness. There is no guarding.  Musculoskeletal: LLE BKA with prosthesis.  Lymphadenopathy:    No cervical or supraclavicular adenopathy.  Neurological: Alert and oriented to person, place, and time. No cranial nerve deficit.  Skin: Skin is warm and dry. No rash noted. No erythema. No pallor.  Psychiatric: Affect and judgment normal.   LABORATORY DATA: I have personally reviewed the data as listed:  Admission on 04/08/2017, Discharged on 04/08/2017  Component Date Value Ref Range Status  . WBC 04/08/2017 7.2  4.0 - 10.5 K/uL Final  . RBC 04/08/2017 4.34  4.22 - 5.81 MIL/uL Final  . Hemoglobin 04/08/2017 13.0  13.0 - 17.0 g/dL Final  . HCT 04/08/2017 40.0  39.0 - 52.0 % Final  . MCV 04/08/2017 92.2  78.0 - 100.0 fL Final  . MCH 04/08/2017 30.0  26.0 - 34.0 pg Final  . MCHC 04/08/2017 32.5  30.0 - 36.0 g/dL Final  . RDW 04/08/2017 15.8* 11.5 - 15.5 % Final  . Platelets 04/08/2017 303  150 - 400 K/uL Final  . Neutrophils Relative % 04/08/2017 57  % Final  . Neutro Abs 04/08/2017 4.2  1.7 - 7.7 K/uL Final  . Lymphocytes Relative 04/08/2017 24  % Final  . Lymphs Abs 04/08/2017 1.8  0.7 - 4.0  K/uL Final  . Monocytes Relative 04/08/2017 16  % Final  . Monocytes Absolute 04/08/2017 1.2* 0.1 - 1.0 K/uL Final  . Eosinophils Relative 04/08/2017 2  % Final  . Eosinophils Absolute 04/08/2017 0.1  0.0 - 0.7 K/uL Final  . Basophils Relative 04/08/2017 1  % Final  . Basophils Absolute 04/08/2017 0.1  0.0 - 0.1 K/uL Final  . Sodium 04/08/2017 139  135 - 145 mmol/L Final  . Potassium 04/08/2017 4.0  3.5 - 5.1 mmol/L Final  . Chloride 04/08/2017 104  101 - 111 mmol/L Final  . CO2  04/08/2017 27  22 - 32 mmol/L Final  . Glucose, Bld 04/08/2017 89  65 - 99 mg/dL Final  . BUN 04/08/2017 14  6 - 20 mg/dL Final  . Creatinine, Ser 04/08/2017 0.82  0.61 - 1.24 mg/dL Final  . Calcium 04/08/2017 8.9  8.9 - 10.3 mg/dL Final  . Total Protein 04/08/2017 6.7  6.5 - 8.1 g/dL Final  . Albumin 04/08/2017 3.1* 3.5 - 5.0 g/dL Final  . AST 04/08/2017 41  15 - 41 U/L Final  . ALT 04/08/2017 22  17 - 63 U/L Final  . Alkaline Phosphatase 04/08/2017 30* 38 - 126 U/L Final  . Total Bilirubin 04/08/2017 0.8  0.3 - 1.2 mg/dL Final  . GFR calc non Af Amer 04/08/2017 >60  >60 mL/min Final  . GFR calc Af Amer 04/08/2017 >60  >60 mL/min Final   Comment: (NOTE) The eGFR has been calculated using the CKD EPI equation. This calculation has not been validated in all clinical situations. eGFR's persistently <60 mL/min signify possible Chronic Kidney Disease.   . Anion gap 04/08/2017 8  5 - 15 Final  . B Natriuretic Peptide 04/08/2017 293.0* 0.0 - 100.0 pg/mL Final  . Prothrombin Time 04/08/2017 26.2* 11.4 - 15.2 seconds Final  . INR 04/08/2017 2.36   Final    RADIOGRAPHIC STUDIES: I have personally reviewed the radiological images as listed and agree with the findings in the report CT Angio Chest PE W and/or Wo Contrast (Accession [id number]) (Order [id number])  Imaging  Date: 04/08/2017 Department: [facility] EMERGENCY DEPARTMENT Released By/Authorizing: Forde Dandy, MD (auto-released)  Exam Information   Status Exam Begun  Exam Ended   Final [99] 04/08/2017 7:02 PM 04/08/2017 7:16 PM  PACS Images   Show images for CT Angio Chest PE W and/or Wo Contrast  Study Result   CLINICAL DATA:  Shortness of breath.  Cough.  EXAM: CT ANGIOGRAPHY CHEST WITH CONTRAST  TECHNIQUE: Multidetector CT imaging of the chest was performed using the standard protocol during bolus administration of intravenous contrast. Multiplanar CT image reconstructions and MIPs were obtained to evaluate the  vascular anatomy.  CONTRAST:  100 mL Isovue 370 nonionic  COMPARISON:  Chest radiograph April 08, 2017  FINDINGS: Cardiovascular: There is no evident pulmonary embolus. Note that there is narrowing of the right upper lobe pulmonary artery branches due to tumor encasement. There is no appreciable thoracic adenopathy or dissection. There is extensive atherosclerotic calcification in the proximal great vessels, most severe in the left subclavian artery were there is hemodynamically significant obstruction approximately 2 cm distal to the origin of this vessel. There is extensive atherosclerotic calcification throughout the aorta. There is extensive native coronary artery calcification. Patient is status post coronary artery bypass grafting. Pericardium is not appreciably thickened.  Mediastinum/Nodes: Thyroid appears unremarkable. There is adenopathy at multiple sites in the chest. In the anterior mediastinum, there are several enlarged lymph nodes,  largest measuring 2.7 x 2.3 cm. Anterior to the distal trachea, there is a lymph node measuring 2.8 x 2.6 cm. 1.8 x 1.8 cm. To the left of the carina, there is adenopathy measuring 2.4 x 2.5 cm. In the right hilum, there is adenopathy in measuring 2.9 x 2.5 cm. In the sub- carinal region, there are several enlarged lymph nodes. Largest lymph node measures 2.6 x 2.2 cm. A lymph node posterior to the carina more inferiorly on the right measures 2.9 x 1.7 cm. No esophageal lesions are evident.  Lungs/Pleura: There is calcification along the posterior parietal pleural on the left consistent with previous asbestos exposure. There is a mass which surrounds right pulmonary artery branches which is felt to arise from the medial aspect of the posterior segment right upper lobe and extends into the hilum. This lesion measures 4.6 x 4.6 cm. In the apex on the right, there is an irregular mass with a somewhat bubbly central appearance  measuring 1.7 x 1.0 cm. This appearance is concerning for neoplastic etiology. There is a nearby 6 mm nodular opacity in the right apex seen on axial slice 16 series 6. On axial slice 29 series 6, there is a 3 mm nodular opacity in the posterior segment of the right upper lobe. There is atelectatic change in both bases, more on the left than on the right. No pleural effusion evident.  Upper Abdomen: In the visualized upper abdominal region, there is cholelithiasis. Gallbladder wall does not appear appreciably thickened. There is atherosclerotic calcification right renal artery as well as in the origins of the superior mesenteric and celiac arteries. There is a small mass in the left adrenal measuring 1.1 x 0.7 cm. This small mass has an attenuation value of 34 Hounsfield units. A small metastasis in the left adrenal cannot be excluded. The right adrenal appears unremarkable. Liver is mildly lobular in contour, a finding that potentially could indicate a degree of underlying hepatic cirrhosis.  Musculoskeletal: There is degenerative change in the thoracic spine. There are no evident blastic or lytic bone lesions. Patient is status post median sternotomy.  Review of the MIP images confirms the above findings.  IMPRESSION: 1.  No evident pulmonary embolus.  2. Right upper lobe mass surrounding and encasing upper lobe pulmonary artery is. This mass extends into the right superior hilar region. This mass is consistent with neoplasm.  3. Nodular lesion with somewhat bubbly central appearance in the right apex measuring 1.7 x 1.0 cm. Suspect second focus of parenchymal lung neoplasm.  4.  Extensive multifocal adenopathy.  5. 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  6. Extensive aortic atherosclerosis. Patient is status post coronary artery bypass grafting. Hemodynamically significant obstruction in the proximal left subclavian artery,  celiac artery, superior mesenteric artery, and right renal artery. Other areas of atherosclerotic calcification noted.  7.  Cholelithiasis.  8. Contour of the liver raises concern for a degree of underlying hepatic cirrhosis.  Aortic Atherosclerosis (ICD10-I70.0).   Electronically Signed   By: Lowella Grip III M.D.   On: 04/08/2017 19:40      ASSESSMENT/PLAN 4.6 cm RUL lung mass with extensive pulmonary lymphadenopathy and possible left adrenal met concerning for lung malignancy.  Reviewed his CTA PE protocol results in detail with the patient and his 2 daughters today. Discussed the stages of lung cancer with the patient and his family.  Stat PET-CT for staging. Stat IR consult for CT guided biopsy for definitive tissue diagnosis. Stat CT  head w/ and w/o contrast to r/o brain mets since patient has new onset headaches for the past 2 weeks. He has a history of St.Jude mechanical aortic valve. RTC in 3 weeks for follow up and to review the above results and to discuss the next plan of care.  All questions were answered. The patient knows to call the clinic with any problems, questions or concerns.  This note was electronically signed.    Twana First, MD  04/22/2017 1:14 PM

## 2017-04-23 ENCOUNTER — Ambulatory Visit (HOSPITAL_COMMUNITY)
Admission: RE | Admit: 2017-04-23 | Discharge: 2017-04-23 | Disposition: A | Discharge status: Home / Self Care | Payer: Medicare Other | Source: Ambulatory Visit | Attending: Oncology | Admitting: Oncology

## 2017-04-23 DIAGNOSIS — G4452 New daily persistent headache (NDPH): Secondary | ICD-10-CM

## 2017-04-23 DIAGNOSIS — R918 Other nonspecific abnormal finding of lung field: Secondary | ICD-10-CM | POA: Insufficient documentation

## 2017-04-23 DIAGNOSIS — I6522 Occlusion and stenosis of left carotid artery: Secondary | ICD-10-CM | POA: Diagnosis not present

## 2017-04-23 DIAGNOSIS — I671 Cerebral aneurysm, nonruptured: Secondary | ICD-10-CM | POA: Diagnosis not present

## 2017-04-23 DIAGNOSIS — R519 Headache, unspecified: Secondary | ICD-10-CM

## 2017-04-23 DIAGNOSIS — R51 Headache: Secondary | ICD-10-CM | POA: Insufficient documentation

## 2017-04-23 MED ORDER — IOPAMIDOL (ISOVUE-300) INJECTION 61%
75.0000 mL | Freq: Once | INTRAVENOUS | Status: AC | PRN
  Administered 2017-04-23: 75 mL via INTRAVENOUS

## 2017-04-28 ENCOUNTER — Inpatient Hospital Stay (HOSPITAL_COMMUNITY)
Admission: EM | Admit: 2017-04-28 | Discharge: 2017-05-01 | DRG: 871 | Disposition: A | Discharge status: Hospice | Payer: Medicare Other | Attending: Internal Medicine | Admitting: Internal Medicine

## 2017-04-28 ENCOUNTER — Encounter (HOSPITAL_COMMUNITY): Payer: Self-pay

## 2017-04-28 ENCOUNTER — Emergency Department (HOSPITAL_COMMUNITY): Payer: Medicare Other

## 2017-04-28 DIAGNOSIS — Z89512 Acquired absence of left leg below knee: Secondary | ICD-10-CM | POA: Diagnosis not present

## 2017-04-28 DIAGNOSIS — I252 Old myocardial infarction: Secondary | ICD-10-CM

## 2017-04-28 DIAGNOSIS — D509 Iron deficiency anemia, unspecified: Secondary | ICD-10-CM | POA: Diagnosis not present

## 2017-04-28 DIAGNOSIS — I429 Cardiomyopathy, unspecified: Secondary | ICD-10-CM | POA: Diagnosis present

## 2017-04-28 DIAGNOSIS — R195 Other fecal abnormalities: Secondary | ICD-10-CM | POA: Diagnosis not present

## 2017-04-28 DIAGNOSIS — Z833 Family history of diabetes mellitus: Secondary | ICD-10-CM

## 2017-04-28 DIAGNOSIS — E7800 Pure hypercholesterolemia, unspecified: Secondary | ICD-10-CM | POA: Diagnosis not present

## 2017-04-28 DIAGNOSIS — R0902 Hypoxemia: Secondary | ICD-10-CM | POA: Diagnosis present

## 2017-04-28 DIAGNOSIS — G4733 Obstructive sleep apnea (adult) (pediatric): Secondary | ICD-10-CM | POA: Diagnosis present

## 2017-04-28 DIAGNOSIS — J440 Chronic obstructive pulmonary disease with acute lower respiratory infection: Secondary | ICD-10-CM | POA: Diagnosis not present

## 2017-04-28 DIAGNOSIS — Z7401 Bed confinement status: Secondary | ICD-10-CM | POA: Diagnosis not present

## 2017-04-28 DIAGNOSIS — Z955 Presence of coronary angioplasty implant and graft: Secondary | ICD-10-CM

## 2017-04-28 DIAGNOSIS — A419 Sepsis, unspecified organism: Secondary | ICD-10-CM | POA: Diagnosis not present

## 2017-04-28 DIAGNOSIS — I4891 Unspecified atrial fibrillation: Secondary | ICD-10-CM | POA: Diagnosis not present

## 2017-04-28 DIAGNOSIS — Z7901 Long term (current) use of anticoagulants: Secondary | ICD-10-CM | POA: Diagnosis not present

## 2017-04-28 DIAGNOSIS — Z66 Do not resuscitate: Secondary | ICD-10-CM | POA: Diagnosis not present

## 2017-04-28 DIAGNOSIS — Z8701 Personal history of pneumonia (recurrent): Secondary | ICD-10-CM

## 2017-04-28 DIAGNOSIS — J189 Pneumonia, unspecified organism: Secondary | ICD-10-CM | POA: Diagnosis not present

## 2017-04-28 DIAGNOSIS — I2510 Atherosclerotic heart disease of native coronary artery without angina pectoris: Secondary | ICD-10-CM | POA: Diagnosis present

## 2017-04-28 DIAGNOSIS — C3411 Malignant neoplasm of upper lobe, right bronchus or lung: Secondary | ICD-10-CM | POA: Diagnosis not present

## 2017-04-28 DIAGNOSIS — Z87442 Personal history of urinary calculi: Secondary | ICD-10-CM

## 2017-04-28 DIAGNOSIS — K219 Gastro-esophageal reflux disease without esophagitis: Secondary | ICD-10-CM | POA: Diagnosis not present

## 2017-04-28 DIAGNOSIS — I5022 Chronic systolic (congestive) heart failure: Secondary | ICD-10-CM | POA: Diagnosis not present

## 2017-04-28 DIAGNOSIS — D696 Thrombocytopenia, unspecified: Secondary | ICD-10-CM | POA: Diagnosis not present

## 2017-04-28 DIAGNOSIS — J690 Pneumonitis due to inhalation of food and vomit: Secondary | ICD-10-CM | POA: Diagnosis present

## 2017-04-28 DIAGNOSIS — E11649 Type 2 diabetes mellitus with hypoglycemia without coma: Secondary | ICD-10-CM | POA: Diagnosis present

## 2017-04-28 DIAGNOSIS — Z79891 Long term (current) use of opiate analgesic: Secondary | ICD-10-CM

## 2017-04-28 DIAGNOSIS — Z515 Encounter for palliative care: Secondary | ICD-10-CM | POA: Diagnosis not present

## 2017-04-28 DIAGNOSIS — Z716 Tobacco abuse counseling: Secondary | ICD-10-CM

## 2017-04-28 DIAGNOSIS — R05 Cough: Secondary | ICD-10-CM | POA: Diagnosis not present

## 2017-04-28 DIAGNOSIS — Z794 Long term (current) use of insulin: Secondary | ICD-10-CM

## 2017-04-28 DIAGNOSIS — Z79899 Other long term (current) drug therapy: Secondary | ICD-10-CM

## 2017-04-28 DIAGNOSIS — F17220 Nicotine dependence, chewing tobacco, uncomplicated: Secondary | ICD-10-CM | POA: Diagnosis present

## 2017-04-28 DIAGNOSIS — E119 Type 2 diabetes mellitus without complications: Secondary | ICD-10-CM

## 2017-04-28 DIAGNOSIS — Z952 Presence of prosthetic heart valve: Secondary | ICD-10-CM | POA: Diagnosis not present

## 2017-04-28 DIAGNOSIS — Z87891 Personal history of nicotine dependence: Secondary | ICD-10-CM | POA: Diagnosis not present

## 2017-04-28 DIAGNOSIS — R791 Abnormal coagulation profile: Secondary | ICD-10-CM | POA: Diagnosis present

## 2017-04-28 DIAGNOSIS — J188 Other pneumonia, unspecified organism: Secondary | ICD-10-CM | POA: Diagnosis not present

## 2017-04-28 DIAGNOSIS — Z9981 Dependence on supplemental oxygen: Secondary | ICD-10-CM

## 2017-04-28 DIAGNOSIS — Z8249 Family history of ischemic heart disease and other diseases of the circulatory system: Secondary | ICD-10-CM

## 2017-04-28 DIAGNOSIS — R042 Hemoptysis: Secondary | ICD-10-CM | POA: Diagnosis present

## 2017-04-28 DIAGNOSIS — R627 Adult failure to thrive: Secondary | ICD-10-CM | POA: Diagnosis present

## 2017-04-28 DIAGNOSIS — H9190 Unspecified hearing loss, unspecified ear: Secondary | ICD-10-CM | POA: Diagnosis present

## 2017-04-28 DIAGNOSIS — E1151 Type 2 diabetes mellitus with diabetic peripheral angiopathy without gangrene: Secondary | ICD-10-CM | POA: Diagnosis not present

## 2017-04-28 DIAGNOSIS — R918 Other nonspecific abnormal finding of lung field: Secondary | ICD-10-CM | POA: Diagnosis not present

## 2017-04-28 DIAGNOSIS — Z7189 Other specified counseling: Secondary | ICD-10-CM

## 2017-04-28 DIAGNOSIS — Z1624 Resistance to multiple antibiotics: Secondary | ICD-10-CM | POA: Diagnosis present

## 2017-04-28 DIAGNOSIS — R279 Unspecified lack of coordination: Secondary | ICD-10-CM | POA: Diagnosis not present

## 2017-04-28 DIAGNOSIS — Z792 Long term (current) use of antibiotics: Secondary | ICD-10-CM

## 2017-04-28 DIAGNOSIS — I110 Hypertensive heart disease with heart failure: Secondary | ICD-10-CM | POA: Diagnosis not present

## 2017-04-28 DIAGNOSIS — Z951 Presence of aortocoronary bypass graft: Secondary | ICD-10-CM

## 2017-04-28 DIAGNOSIS — R062 Wheezing: Secondary | ICD-10-CM | POA: Diagnosis not present

## 2017-04-28 HISTORY — DX: Other nonspecific abnormal finding of lung field: R91.8

## 2017-04-28 LAB — CBC WITH DIFFERENTIAL/PLATELET
BASOS ABS: 0 10*3/uL (ref 0.0–0.1)
BASOS PCT: 0 %
EOS ABS: 0 10*3/uL (ref 0.0–0.7)
EOS PCT: 0 %
HCT: 37.3 % — ABNORMAL LOW (ref 39.0–52.0)
Hemoglobin: 12 g/dL — ABNORMAL LOW (ref 13.0–17.0)
LYMPHS PCT: 7 %
Lymphs Abs: 1 10*3/uL (ref 0.7–4.0)
MCH: 28.6 pg (ref 26.0–34.0)
MCHC: 32.2 g/dL (ref 30.0–36.0)
MCV: 89 fL (ref 78.0–100.0)
MONO ABS: 1.1 10*3/uL — AB (ref 0.1–1.0)
Monocytes Relative: 8 %
Neutro Abs: 11.8 10*3/uL — ABNORMAL HIGH (ref 1.7–7.7)
Neutrophils Relative %: 85 %
PLATELETS: 114 10*3/uL — AB (ref 150–400)
RBC: 4.19 MIL/uL — ABNORMAL LOW (ref 4.22–5.81)
RDW: 15 % (ref 11.5–15.5)
WBC: 14 10*3/uL — ABNORMAL HIGH (ref 4.0–10.5)

## 2017-04-28 LAB — COMPREHENSIVE METABOLIC PANEL
ALT: 17 U/L (ref 17–63)
AST: 28 U/L (ref 15–41)
Albumin: 2.7 g/dL — ABNORMAL LOW (ref 3.5–5.0)
Alkaline Phosphatase: 26 U/L — ABNORMAL LOW (ref 38–126)
Anion gap: 8 (ref 5–15)
BUN: 26 mg/dL — AB (ref 6–20)
CHLORIDE: 97 mmol/L — AB (ref 101–111)
CO2: 33 mmol/L — ABNORMAL HIGH (ref 22–32)
Calcium: 8.8 mg/dL — ABNORMAL LOW (ref 8.9–10.3)
Creatinine, Ser: 0.7 mg/dL (ref 0.61–1.24)
GFR calc Af Amer: >60 mL/min (ref >60)
Glucose, Bld: 67 mg/dL (ref 65–99)
POTASSIUM: 3.7 mmol/L (ref 3.5–5.1)
Sodium: 138 mmol/L (ref 135–145)
Total Bilirubin: 1.4 mg/dL — ABNORMAL HIGH (ref 0.3–1.2)
Total Protein: 5.6 g/dL — ABNORMAL LOW (ref 6.5–8.1)

## 2017-04-28 LAB — POC OCCULT BLOOD, ED: FECAL OCCULT BLD: POSITIVE — AB

## 2017-04-28 LAB — PROTIME-INR
INR: 4.35 — AB
PROTHROMBIN TIME: 41.4 s — AB (ref 11.4–15.2)

## 2017-04-28 LAB — PROCALCITONIN: Procalcitonin: <0.1 ng/mL

## 2017-04-28 LAB — GLUCOSE, CAPILLARY: Glucose-Capillary: 115 mg/dL — ABNORMAL HIGH (ref 65–99)

## 2017-04-28 LAB — I-STAT CG4 LACTIC ACID, ED: Lactic Acid, Venous: 1.15 mmol/L (ref 0.5–1.9)

## 2017-04-28 MED ORDER — IPRATROPIUM-ALBUTEROL 0.5-2.5 (3) MG/3ML IN SOLN
3.0000 mL | Freq: Four times a day (QID) | RESPIRATORY_TRACT | Status: DC
  Administered 2017-04-29 – 2017-05-01 (×10): 3 mL via RESPIRATORY_TRACT
  Filled 2017-04-28 (×10): qty 3

## 2017-04-28 MED ORDER — ALBUTEROL SULFATE (2.5 MG/3ML) 0.083% IN NEBU: 2.5000 mg | INHALATION_SOLUTION | RESPIRATORY_TRACT | Status: DC | PRN

## 2017-04-28 MED ORDER — INSULIN ASPART 100 UNIT/ML ~~LOC~~ SOLN: 0.0000 [IU] | Freq: Every day | SUBCUTANEOUS | Status: DC

## 2017-04-28 MED ORDER — PREGABALIN 50 MG PO CAPS
50.0000 mg | ORAL_CAPSULE | Freq: Three times a day (TID) | ORAL | Status: DC
  Administered 2017-04-28: 50 mg via ORAL
  Filled 2017-04-28: qty 1

## 2017-04-28 MED ORDER — ALLOPURINOL 300 MG PO TABS
300.0000 mg | ORAL_TABLET | Freq: Every day | ORAL | Status: DC
  Administered 2017-04-28: 300 mg via ORAL
  Filled 2017-04-28: qty 1

## 2017-04-28 MED ORDER — RAMIPRIL 5 MG PO CAPS: 5.0000 mg | ORAL_CAPSULE | Freq: Every day | ORAL | Status: DC

## 2017-04-28 MED ORDER — INSULIN ASPART 100 UNIT/ML ~~LOC~~ SOLN
0.0000 [IU] | Freq: Three times a day (TID) | SUBCUTANEOUS | Status: DC
  Administered 2017-04-30 (×2): 3 [IU] via SUBCUTANEOUS
  Administered 2017-05-01: 5 [IU] via SUBCUTANEOUS
  Administered 2017-05-01: 3 [IU] via SUBCUTANEOUS

## 2017-04-28 MED ORDER — SODIUM CHLORIDE 0.9 % IV BOLUS (SEPSIS)
500.0000 mL | Freq: Once | INTRAVENOUS | Status: AC
  Administered 2017-04-28: 500 mL via INTRAVENOUS

## 2017-04-28 MED ORDER — PIPERACILLIN-TAZOBACTAM 3.375 G IVPB 30 MIN
3.3750 g | Freq: Once | INTRAVENOUS | Status: AC
  Administered 2017-04-28: 3.375 g via INTRAVENOUS
  Filled 2017-04-28: qty 50

## 2017-04-28 MED ORDER — OXYCODONE-ACETAMINOPHEN 5-325 MG PO TABS
1.0000 | ORAL_TABLET | ORAL | Status: DC | PRN
  Administered 2017-04-28 – 2017-04-30 (×2): 1 via ORAL
  Filled 2017-04-28 (×2): qty 1

## 2017-04-28 MED ORDER — FUROSEMIDE 10 MG/ML IJ SOLN
80.0000 mg | Freq: Two times a day (BID) | INTRAMUSCULAR | Status: AC
  Administered 2017-04-28 – 2017-04-29 (×2): 80 mg via INTRAVENOUS
  Filled 2017-04-28 (×2): qty 8

## 2017-04-28 MED ORDER — VANCOMYCIN HCL IN DEXTROSE 1-5 GM/200ML-% IV SOLN
1000.0000 mg | Freq: Once | INTRAVENOUS | Status: AC
  Administered 2017-04-28: 1000 mg via INTRAVENOUS
  Filled 2017-04-28: qty 200

## 2017-04-28 MED ORDER — VANCOMYCIN HCL IN DEXTROSE 1-5 GM/200ML-% IV SOLN
1000.0000 mg | Freq: Three times a day (TID) | INTRAVENOUS | Status: DC
  Administered 2017-04-29 – 2017-05-01 (×8): 1000 mg via INTRAVENOUS
  Filled 2017-04-28 (×7): qty 200

## 2017-04-28 MED ORDER — METOPROLOL SUCCINATE ER 25 MG PO TB24: 25.0000 mg | ORAL_TABLET | Freq: Every day | ORAL | Status: DC

## 2017-04-28 MED ORDER — INSULIN DETEMIR 100 UNIT/ML ~~LOC~~ SOLN
30.0000 [IU] | Freq: Two times a day (BID) | SUBCUTANEOUS | Status: DC
  Administered 2017-04-28: 30 [IU] via SUBCUTANEOUS
  Filled 2017-04-28: qty 0.3
  Filled 2017-04-28: qty 1
  Filled 2017-04-28 (×3): qty 0.3

## 2017-04-28 MED ORDER — POTASSIUM CHLORIDE CRYS ER 20 MEQ PO TBCR: 20.0000 meq | EXTENDED_RELEASE_TABLET | Freq: Every day | ORAL | Status: DC

## 2017-04-28 MED ORDER — FENOFIBRATE 160 MG PO TABS
160.0000 mg | ORAL_TABLET | Freq: Every day | ORAL | Status: DC
  Administered 2017-04-28: 160 mg via ORAL
  Filled 2017-04-28: qty 1

## 2017-04-28 MED ORDER — OXYCODONE-ACETAMINOPHEN 10-325 MG PO TABS: 1.0000 | ORAL_TABLET | ORAL | Status: DC | PRN

## 2017-04-28 MED ORDER — ROSUVASTATIN CALCIUM 20 MG PO TABS
20.0000 mg | ORAL_TABLET | Freq: Every day | ORAL | Status: DC
  Administered 2017-04-28: 20 mg via ORAL
  Filled 2017-04-28: qty 1

## 2017-04-28 MED ORDER — ISOSORBIDE MONONITRATE ER 60 MG PO TB24
60.0000 mg | ORAL_TABLET | Freq: Every day | ORAL | Status: DC
  Administered 2017-04-30 – 2017-05-01 (×2): 60 mg via ORAL
  Filled 2017-04-28 (×2): qty 1

## 2017-04-28 MED ORDER — METOPROLOL SUCCINATE ER 25 MG PO TB24
12.5000 mg | ORAL_TABLET | Freq: Every day | ORAL | Status: DC
  Administered 2017-04-28: 12.5 mg via ORAL
  Filled 2017-04-28: qty 1

## 2017-04-28 MED ORDER — OXYCODONE HCL 5 MG PO TABS
5.0000 mg | ORAL_TABLET | ORAL | Status: DC | PRN
  Administered 2017-04-28 – 2017-04-30 (×2): 5 mg via ORAL
  Filled 2017-04-28 (×2): qty 1

## 2017-04-28 MED ORDER — DEXTROSE 5 % IV SOLN
2.0000 g | Freq: Two times a day (BID) | INTRAVENOUS | Status: DC
  Administered 2017-04-28: 2 g via INTRAVENOUS
  Filled 2017-04-28 (×3): qty 2

## 2017-04-28 MED ORDER — RAMIPRIL 1.25 MG PO CAPS
2.5000 mg | ORAL_CAPSULE | Freq: Every day | ORAL | Status: DC
  Filled 2017-04-28: qty 2

## 2017-04-28 MED ORDER — METOPROLOL SUCCINATE ER 25 MG PO TB24: 12.5000 mg | ORAL_TABLET | Freq: Two times a day (BID) | ORAL | Status: DC

## 2017-04-28 MED ORDER — PANTOPRAZOLE SODIUM 40 MG PO TBEC: 40.0000 mg | DELAYED_RELEASE_TABLET | Freq: Every day | ORAL | Status: DC

## 2017-04-28 MED ORDER — PREDNISONE 20 MG PO TABS: 40.0000 mg | ORAL_TABLET | Freq: Every day | ORAL | Status: DC

## 2017-04-28 NOTE — Progress Notes (Signed)
Pharmacy Antibiotic Note  Tanner Pena is a 76 y.o. male admitted on 04/28/2017 with pneumonia.  Pharmacy has been consulted for Vancomycin and Cefepime dosing.  Plan: Vancomycin 1gm IV every 8 hours.  Goal trough 15-20 mcg/mL.  Cefepime 2gm IV every 12 hours. Monitor labs, micro and vitals.   Height: 5\' 10"  (177.8 cm) Weight: 199 lb (90.3 kg) IBW/kg (Calculated) : 73  Temp (24hrs), Avg:99.2 F (37.3 C), Min:98.2 F (36.8 C), Max:100.2 F (37.9 C)   Recent Labs Lab 04/28/17 1556 04/28/17 1610  WBC 14.0*  --   CREATININE 0.70  --   LATICACIDVEN  --  1.15    Estimated Creatinine Clearance: 88.8 mL/min (by C-G formula based on SCr of 0.7 mg/dL).    No Known Allergies  Antimicrobials this admission: Vanc 9/10 >>  Cefepime 9/10 >>   Dose adjustments this admission: n/a   Microbiology results: 9/10 BCx: pending  UCx:   9/10 Sputum: pending   MRSA PCR:   Thank you for allowing pharmacy to be a part of this patient's care.  Pricilla Larsson 04/28/2017 8:51 PM

## 2017-04-28 NOTE — H&P (Signed)
History and Physical    Tanner Pena ZCH:[id number] DOB: [birth year] DOA: 04/28/2017  PCP: Redmond School, MD Consultants:  Luan Pulling - pulmonary; Talbert Cage - oncology; [name] - cardiology; Debbora Presto - endocrinology; Blanch Media - podiatry Patient coming from: Home - lives with daughter; [facility]: [phone]  Chief Complaint: Weakness, hemoptysis  HPI: Tanner Pena is a 76 y.o. 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  He was recently diagnosed with a lung mass.  Since last Thursday, he has been coughing up blood.  Ever since last hospitalization, he has been very weak.  Decreased strength, decreased PO.  His right foot with significant swelling - he lets it hang off the bed at night.  His hand is also swelling despite Lasix.  No medications so far today. +cough, productive of foamy white sputum and blood.  No known fevers.  He has been wearing home O2 since last hospitalization, on 2L home O2.  40 pound weight loss in the last 2 months.    He was hospitalized from 7/21-25 with CAP.  He was recommended to go to SNF but declined.  He returned to the ER with cough on 8/21 and was diagnosed with a lung mass by CT.  He saw Dr. Talbert Cage on 9/4 regarding his 4.6 cm RUL lung mass with extensive pulmonary LAD and possible left adrenal met.  He was ordered for a PET-CT (9/17); head CT (negative for mets); and CT-guided biopsy for definitive tissue diagnosis.  ED Course: Lung mass with dyspnea, hemoptysis.  CXR with multilobar PNA.  Oxygenating appropriately on home O2.  Given Vanc/Zosyn.  Initial BP 99/56, increased to 121/57 after 500 cc bolus.  Heme positive, no gross blood.  INR 4.5.  Hgb 12.    Review of Systems: As per HPI; otherwise review of systems reviewed and negative.   Ambulatory Status:  Ambulates with assistance, needs assistance with transfers  now most of the time  Past Medical History:  Diagnosis Date  . Acute blood loss anemia 02/2015 & 05/2015   Hemoglobin 5.5 on 05/31/15; status post transfusion.  . Arthritis   . CAD (coronary artery disease)    a. 05/13/14 [name] s/p overlapping DESx2 to SVG to RCA. b.  s/p CABG '90 with redo '94 & stent to RCA SVG in 2005  . Chronic back pain   . Chronic systolic heart failure ([place])   . Chronic toe ulcer ([place])    a. Left foot  . COPD (chronic obstructive pulmonary disease) ([place])   . Critical lower limb ischemia   . Dysphagia   . Essential hypertension   . GERD (gastroesophageal reflux disease)   . GI bleeding 05/31/2015   Source not identified.  . Gout   . History of kidney stones   . Hypercholesteremia   . Lung mass    probable lung cancer - pending IR biopsy  . Myocardial infarction ([place])   . Neuromuscular disorder ([place])   . Peripheral neuropathy   . Peripheral vascular disease ([place])    Lower extremity PCI/stenting  . S/P aortic valve replacement 1990   a. St. Jude  . Sleep apnea   . Type 2 diabetes mellitus ([place]) 2007    Past Surgical History:  Procedure Laterality Date  . AMPUTATION Left 07/13/2014   Procedure: Transmetatarsal Amputation;  Surgeon: [name], MD;  Location: [place];  Service: Orthopedics;  Laterality: Left;  . AMPUTATION Left 08/20/2014   Procedure: Revision Transmetatarsal Amputation versus Below Knee Amputation;  Surgeon: [name], MD;  Location: [place];  Service: Orthopedics;  Laterality: Left;  . ANGIOPLASTY ILLIAC ARTERY    . AORTIC VALVE REPLACEMENT  1990   St. Jude  . BACK SURGERY  [id number]   2  . BIOPSY N/A 06/20/2015   Procedure: BIOPSY;  Surgeon: Danie Binder, MD;  Location: [facility];  Service: Endoscopy;  Laterality: N/A;  . CATARACT EXTRACTION     bilateral  . COLONOSCOPY  05/2011   Dr. [name]: benign rectal polyp, left sided tics, ascending colonic ulcers (path c/w ischemia)  . COLONOSCOPY WITH PROPOFOL N/A 06/01/2015   RMR: normal  appearing rectal mucosa. Scattered left-sided diverticula. the remainder of the colonic mucosa appeared normal. the distal 5 cm of terminal ileal mucosa also appeared normal. retroflexion was performed.   . CORONARY ARTERY BYPASS GRAFT  1994   6 vessels  . CORONARY STENT PLACEMENT  2005   RCA vein graft A 3.0x13.0 TAXUS stent was then placed int he vessel a Viva 3.0x4.0 (perfusion balloon was made ready it was placed through the entire lenght of the stent  . ESOPHAGOGASTRODUODENOSCOPY N/A 02/09/2015   DR. Schooler: Normal EGD  . ESOPHAGOGASTRODUODENOSCOPY  05/2011   Dr. [name]: s/p esophageal dilation, antral erosions/nodularity with benign biopsies  . 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 ESOPHAGOGASTRODUODENOSCOPY (EGD) WITH PROPOFOL N/A 06/20/2015   Procedure: ESOPHAGOGASTRODUODENOSCOPY (EGD) WITH PROPOFOL;  Surgeon: Danie Binder, MD;  Location: [facility];  Service: Endoscopy;  Laterality: N/A;  . EYE SURGERY    . GIVENS CAPSULE STUDY N/A 06/08/2015   Procedure: GIVENS CAPSULE STUDY;  Surgeon: [name], MD;  [facility];  Service: Endoscopy;  Laterality: N/A;  0700   . IR GENERIC HISTORICAL  06/06/2016   IR RADIOLOGIST EVAL & MGMT 06/06/2016 Corrie Mckusick, DO [facility] INTERV RAD  . LEFT HEART CATHETERIZATION WITH CORONARY ANGIOGRAM N/A 05/11/2014   Procedure: LEFT HEART CATHETERIZATION WITH CORONARY ANGIOGRAM;  Surgeon: [name], MD;  Location: [facility] CATH LAB;  Service: Cardiovascular;  Laterality: N/A;  . LOWER EXTREMITY ANGIOGRAM N/A 02/15/2013   Procedure: LOWER EXTREMITY ANGIOGRAM;  Surgeon: [name], MD;  Location: [facility] CATH LAB;  Service: Cardiovascular;  Laterality: N/A;  . LOWER EXTREMITY ANGIOGRAM N/A 06/06/2014   Procedure: LOWER EXTREMITY ANGIOGRAM;  Surgeon: [name], MD;  Location: [facility] LAB;  Service:  Cardiovascular;  Laterality: N/A;  . [name]  06/13/2011   Procedure: Venia Minks DILATION;  Surgeon: [name], MD;  Location: [facility];  Service: Endoscopy;  Laterality: N/A;  Dilated to 56.   [name] PERCUTANEOUS CORONARY STENT INTERVENTION (PCI-S) N/A 05/13/2014   Procedure: PERCUTANEOUS CORONARY STENT INTERVENTION (PCI-S);  Surgeon: [name], MD;  Location: [facility] CATH LAB;  Service: Cardiovascular;  Laterality: N/A;  . Peripheral vascular procedures lower extremities     Right external iliac  artery PTA and stenting as well as bilateral SFA intervention remotely. Repeat procedures in 2011 bilaterally  . ROTATOR CUFF REPAIR     right  . STUMP REVISION Left 09/23/2014   Procedure: Revision Left Below Knee Amputation;  Surgeon: [name], MD;  Location: [place];  Service: Orthopedics;  Laterality: Left;  . STUMP REVISION Left 10/13/2014  Procedure: REVISION LEFT BELOW KNEE AMPUTATION STUMP;  Surgeon: [name], MD;  Location: [facility];  Service: Orthopedics;  Laterality: Left;    Social History   Social History  . Marital status: Legally Separated    Spouse name: N/A  . Number of children: 5  . Years of education: N/A   Occupational History  . 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 Smoking status: Former Smoker    Packs/day: 1.00    Years: 55.00    Types: Cigarettes    Start date: 08/19/1953    Quit date: 05/07/2014  . Smokeless tobacco: Current User    Types: Chew    Last attempt to quit: 08/20/1995     Comment: Has quit on 3 occasions. Counseling given today 5-10 minutes   I am more than likely going to quit "  . Alcohol use No     Comment: Socially, sometimes 12 ounce beer daily, may go month without/no whiskey  . Drug use: No  . Sexual activity: Not Currently   Other Topics Concern  . Not on file   Social History Narrative   Has 3 daughters   Has 2 sons    No Known Allergies  Family History  Problem Relation Age  of Onset  . Diabetes Father   . Heart failure Other   . Colon cancer Neg Hx   . Liver disease Neg Hx     Prior to Admission medications   Medication Sig Start Date End Date Taking? Authorizing Provider  albuterol (PROVENTIL) 4 MG tablet Take 4 mg by mouth 3 (three) times daily.  01/20/15   [provider]  allopurinol (ZYLOPRIM) 300 MG tablet Take 300 mg by mouth daily.    [provider]  amoxicillin-clavulanate (AUGMENTIN) 875-125 MG tablet Take 1 tablet by mouth every 12 (twelve) hours. 04/08/17   Forde Dandy, MD  cholecalciferol (VITAMIN D) 1000 UNITS tablet Take 2,000 Units by mouth daily.    [provider]  COMBIVENT RESPIMAT 20-100 MCG/ACT AERS respimat Inhale 1 puff into the lungs every 6 (six) hours as needed for wheezing or shortness of breath.  02/18/17   [provider]  dexlansoprazole (DEXILANT) 60 MG capsule Take 1 capsule (60 mg total) by mouth daily. 10/23/16   Annitta Needs, NP  fenofibrate (TRICOR) 145 MG tablet TAKE 1 TABLET BY MOUTH ONCE DAILY FOR CHOLESTEROL. 04/11/17   Troy Sine, MD  fish oil-omega-3 fatty acids 1000 MG capsule Take 1 capsule (1 g total) by mouth 2 (two) times daily. 01/22/13   [place], Casimiro Needle, RPH-CPP  furosemide (LASIX) 80 MG tablet Take 1 tablet (80 mg total) by mouth daily. 12/07/15   Kathie Dike, MD  glimepiride (AMARYL) 2 MG tablet Take 2 mg by mouth 2 (two) times daily. 03/20/17   [provider]  Insulin Detemir (LEVEMIR) 100 UNIT/ML Pen Inject 20 Units into the skin daily. Patient taking differently: Inject 30 Units into the skin 2 (two) times daily.  12/07/15   Kathie Dike, MD  Insulin Pen Needle 29G X 12.7MM MISC Use as directed 12/07/15   Kathie Dike, MD  isosorbide mononitrate (IMDUR) 60 MG 24 hr tablet TAKE ONE TABLET BY MOUTH ONCE DAILY. 06/12/16   Troy Sine, MD  metoprolol succinate (TOPROL-XL) 25 MG 24 hr tablet TAKE ONE TABLET BY MOUTH IN THE MORNING AND 1/2 TABLET IN THE  EVENING. 04/11/17   Troy Sine, MD  NITROSTAT 0.4 MG SL tablet PLACE 1 TAB UNDER TONGUE EVERY 5 MIN IF NEEDED FOR CHEST PAIN. MAY USE 3 TIMES.NO RELIEF CALL 911. 07/20/15   Eileen Stanford, PA-C  oxyCODONE-acetaminophen (PERCOCET) 10-325 MG per tablet Take 1 tablet by mouth every 4 (four) hours as needed for pain. Patient taking differently: Take 1 tablet by mouth every 3 (three) hours as needed for pain.  09/23/14   [name], MD  potassium chloride SA (K-DUR,KLOR-CON) 20 MEQ tablet Take 20 mEq by mouth daily.      [provider]  predniSONE (DELTASONE) 10 MG tablet Take 1 tablet (10 mg total) by mouth daily with breakfast. Take 6 tablets today and then decrease by 1 tablet daily until none are left. 03/12/17   Isaac Bliss, Rayford Halsted, MD  pregabalin (LYRICA) 50 MG capsule Take one capsule by mouth twice daily for pains Patient taking differently: Take 50 mg by mouth 3 (three) times daily.  08/23/14   Blanchie Serve, MD  ramipril (ALTACE) 2.5 MG capsule Take 2.5 mg by mouth daily. 04/21/17   [provider]  ramipril (ALTACE) 5 MG capsule Take 5 mg by mouth daily. 04/21/17   [provider]  rosuvastatin (CRESTOR) 20 MG tablet TAKE (1) TABLET BY MOUTH DAILY. 04/11/17   Troy Sine, MD  sitaGLIPtin (JANUVIA) 50 MG tablet Take 50 mg by mouth daily.    [provider]  vitamin C (ASCORBIC ACID) 500 MG tablet Take 500 mg by mouth daily.    [provider]  warfarin (COUMADIN) 5 MG tablet Take 1-1.5 tablets by mouth daily as directed by coumadin clinic Patient taking differently: Take 2.5-5 mg by mouth See admin instructions. Take 63m on Mondays and Fridays. Take 2.557mon all other days 12/23/16   KeTroy SineMD    Physical Exam: Vitals:   04/28/17 1930 04/28/17 1952 04/28/17 2117 04/28/17 2147  BP: (!) 122/53 (!) 122/53 (!) 87/51 (!) 119/48  Pulse: 67 74 77 68  Resp: (!) 23 (!) 23 (!) 22   Temp:   98 F (36.7 C)   TempSrc:   Oral     SpO2: 98% 98% 94%   Weight:   88.9 kg (195 lb 15.8 oz)   Height:         General:   Appears somnolent and sedentary and is chronically ill-appearing Eyes:  PERRL, EOMI, normal lids, iris ENT:  grossly normal hearing, lips & tongue, mmm; poor dentition Neck:  no LAD, masses or thyromegaly; no carotid bruits Cardiovascular:  RRR, no m/r/g. No LE edema.  Respiratory:   CTA bilaterally with diminished breath sounds.  Normal respiratory effort. Abdomen:  soft, NT, ND, NABS Skin:  no rash or induration seen on limited exam Musculoskeletal:  S/p L BKA; marked RLE edema to above knee Psychiatric:  Blunted mood and affect, speech fluent and appropriate, AOx3 Neurologic:  CN 2-12 grossly intact, moves all extremities in coordinated fashion, sensation intact    Radiological Exams on Admission: Dg Chest Port 1 View  Result Date: 04/28/2017 CLINICAL DATA:  Cough and hemoptysis EXAM: PORTABLE CHEST 1 VIEW COMPARISON:  Chest radiograph and chest CT April 08, 2017 FINDINGS: There is airspace consolidation in the right mid lung and left base regions. There are small pleural effusions bilaterally. There is cardiomegaly with mild pulmonary venous hypertension. There is aortic atherosclerosis. 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 No evident adenopathy. There is evidence of old trauma involving the lateral  right clavicle. IMPRESSION: Airspace consolidation, likely multifocal pneumonia, in the right mid lung and left base regions. There is evidence of pulmonary vascular congestion with small left pleural effusion. There is aortic atherosclerosis. 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 Followup PA and lateral chest radiographs recommended in 3-4 weeks following trial of antibiotic therapy to ensure resolution and exclude underlying malignancy. Aortic Atherosclerosis (ICD10-I70.0). Electronically Signed   By: Lowella Grip III M.D.   On:  04/28/2017 15:48    EKG: Independently reviewed.  Afib with rate 78; PVCs with no evidence of acute ischemia   Labs on Admission: I have personally reviewed the available labs and imaging studies at the time of the admission.  Pertinent labs:   BUN 26/Creatinine 0.70/>60 Albumin 2.7 Lactate 1.15 Procalcitonin <0.10 WBC 14.0 Hgb 12.0 Platelets 114 INR 4.35 Heme positive  Assessment/Plan Principal Problem:   Multifocal pneumonia Active Problems:   Chronic anticoagulation   Elevated INR   Anemia, iron deficiency   Controlled type 2 diabetes mellitus without complication (HCC)   Sepsis (HCC)   Mass of upper lobe of right lung   Thrombocytopenia (HCC)   Multifocal PNA with sepsis -Elevated WBC count, tachycardia, tachypnea with borderline hypotension -While awaiting blood cultures, this appears to be a preseptic condition. -Sepsis protocol initiated -Given productive cough and multifocal infiltrates on chest x-ray, most likely recurrent pneumonia.  -Other etiologies include aspiration versus URI (most likely viral). -This is his 3rd such admission since March. -CURB-65 score is 3+, meaning that the patient has a 14.5% risk of death. -Pneumonia Severity Index (PSI) is Class 4, 9% mortality. -Corticosteroids have been to shown to low overall mortality rate; risk of ARDS; and need for mechanical ventilation.  This is particularly true in severe PNA (class 3+ PSI).  Will add 40 mg prednisone daily for 3-7 days. -The patient has the following criteria for MDR (multi-drug resistance): IV antibiotics in the last 90 days; >2 days of inpatient treatment within the last 90 days; SNF placement. -The patient will need treatment for HCAP due to MDR risk factors as above; will treat with Cefepime and Vancomycin. -Additional complicating factors include: hypoxia/hypoxemia; and immunocompromise from recent diagnosis of lung cancer. -NS @ 75cc/hr -Fever control -Repeat CBC in am -Sputum  cultures -Blood cultures -Strep pneumo testing -Will order procalcitonin levels.  >0.5 indicates infection and >>0.5 indicates more serious disease.  As the procalcitonin level normalizes, it will be reasonable to consider de-escalation of antibiotic coverage. -albuterol PRN -Standing Duonebs -Will admit and continue to monitor  Chronic anticoagulation with elevated INR -Afib -Rate controlled -Hold Coumadin and allow INR to drift down -Essentially normal Hgb despite report of hemoptysis -Suspect heme positive stool is associated with swallowed hemoptysis -Will trend Hgb  Lung Mass -For CT biopsy but not yet scheduled - consider ordering as inpatient if patient is able to tolerate -He is currently too weak for any kind of therapy and possibly for biopsy -Palliative care consult -Suspect that hypoalbuminemia is related to malignancy and is causing some anasarca - primarily of left hand and right LE -Nutrition consult  DM -Will check A1c - it was 6.8 in 2/17 -hold Amaryl -Continue Levemir -Cover with moderate-scale SSI  Thrombocytopenia -Appears to be new -Likely related to infection/sepsis -Will follow  DVT prophylaxis: Coumadin (holding due to elevated INR) Code Status:  DNR - confirmed with patient/family Family Communication: Daughters present during evaluation  Disposition Plan: Likely to benefit from SNF placement for rehab Consults  called: Palliative care; PT; Nutrition Admission status: Admit - It is my clinical opinion that admission to INPATIENT is reasonable and necessary because this patient will require at least 2 midnights in the hospital to treat this condition based on the medical complexity of the problems presented.  Given the aforementioned information, the predictability of an adverse outcome is felt to be significant.    Karmen Bongo MD [facility]  If note is complete, please contact covering daytime or nighttime  physician. [email or web address] Password [email or web address]  04/28/2017, 11:32 PM

## 2017-04-28 NOTE — ED Notes (Signed)
CRITICAL VALUE ALERT  Critical Value:  INR 4.35  Date & Time Notied:  04/28/17 @ 9563  Provider Notified: Dr. Jeanell Sparrow  Orders Received/Actions taken: EDP made aware

## 2017-04-28 NOTE — ED Provider Notes (Addendum)
[place] DEPT Provider Note   CSN: [id number] Arrival date & time: 04/28/17  1456     History   Chief Complaint Chief Complaint  Patient presents with  . Respiratory Distress  . GI Bleeding    HPI Tanner Pena is a 76 y.o. male.  HPI  76 y.o. Male with recently diagnosed lung mass, h.o. Cap in July, t2d, copd, mi presents with increased dyspnea and weakness with increased wheezing, on oxygen.  Saturday began coughing up blood part of phlegm, occurred several times, also, today states coughed up a "gob " of blood. Patient states weakness began yesterday and is generalized, with some increased cough and dyspnea. denies other symptoms fever or chills, nausea vomtiing..  Saturday at baseline which is standing but not abnormality.Oxygen dependent at baseline.    Past Medical History:  Diagnosis Date  . Acute blood loss anemia 02/2015 & 05/2015   Hemoglobin 5.5 on 05/31/15; status post transfusion.  . Arthritis   . CAD (coronary artery disease)    a. 05/13/14 [name] s/p overlapping DESx2 to SVG to RCA. b.  s/p CABG '90 with redo '94 & stent to RCA SVG in 2005  . Chronic back pain   . Chronic systolic heart failure ([place])   . Chronic toe ulcer ([place])    a. Left foot  . COPD (chronic obstructive pulmonary disease) ([place])   . Critical lower limb ischemia   . Dysphagia   . Essential hypertension   . GERD (gastroesophageal reflux disease)   . GI bleeding 05/31/2015   Source not identified.  . Gout   . History of kidney stones   . Hypercholesteremia   . Lung mass   . Myocardial infarction ([place])   . Neuromuscular disorder ([place])   . Peripheral neuropathy   . Peripheral vascular disease ([place])    Lower extremity PCI/stenting  . S/P aortic valve replacement 1990   a. St. Jude  . Sleep apnea   . Type 2 diabetes mellitus ([place]) 2007    Patient Active Problem List   Diagnosis Date Noted  . Mass of upper lobe of right lung 04/22/2017  . HCAP (healthcare-associated pneumonia)  03/08/2017  . Sepsis ([place]) 03/08/2017  . GERD (gastroesophageal reflux disease) 11/28/2016  . COPD exacerbation ([place]) 12/06/2015  . Acute on chronic systolic congestive heart failure ([place])   . Frequent PVCs 10/05/2015  . Cardiomyopathy, ischemic 10/05/2015  . AVM (arteriovenous malformation)   . Acute gastric ulcer   . S/P AVR (aortic valve replacement)   . Controlled type 2 diabetes mellitus without complication ([place])   . Chronic gastric ulcer   . Gastric AVM   . Coronary artery disease involving native coronary artery of native heart without angina pectoris   . AKI (acute kidney injury) ([place]) 10/01/2015  . UGIB (upper gastrointestinal bleed) 09/30/2015  . Chest pain 07/29/2015  . CAD in native artery 07/29/2015  . Gastritis and gastroduodenitis   . Melena 06/20/2015  . GIB (gastrointestinal bleeding) 06/20/2015  . Absolute anemia   . Bleeding gastrointestinal   . History of coronary artery stent placement   . Anticoagulation management encounter   . GI bleed 06/14/2015  . Essential hypertension 06/14/2015  . Iron deficiency anemia due to chronic blood loss   . Diverticulosis 06/01/2015  . Anemia, iron deficiency   . Chronic obstructive pulmonary disease ([place])   . Type 2 diabetes mellitus with stage 1 chronic kidney disease, without long-term current use of insulin ([place])   . GI bleeding  05/31/2015  . 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 Acute kidney injury ([place]) 05/30/2015  . Coronary artery disease involving native coronary artery of native heart with angina pectoris ([place])   . 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 Chronic systolic heart failure ([place]) 03/05/2015  . Mesenteric ischemia, chronic ([place])   . Acute blood loss anemia 03/02/2015  . Elevated INR 03/02/2015  . Chronic mesenteric ischemia ([place])   . IDA (iron deficiency anemia)   . Chronic anticoagulation   . Enteritis 03/01/2015  . Other noninfectious gastroenteritis   . Abnormal CT of the abdomen  02/17/2015  . Abdominal pain   . Acute respiratory failure with hypoxia ([place])   . Iron deficiency anemia   . Systolic CHF, acute on chronic (HCC)   . NSTEMI (non-ST elevated myocardial infarction) ([place])   . CHF exacerbation ([place]) 02/04/2015  . SOB (shortness of breath)   . Acute respiratory failure ([place]) 12/28/2014  . CAP (community acquired pneumonia) 12/28/2014  . COPD with exacerbation ([place]) 12/28/2014  . Dehiscence of operative wound post left below knee amputation 10/13/2014  . S/P below knee amputation ([place]) 10/13/2014  . Diabetic peripheral angiopathy ([place]) 09/04/2014  . Below knee amputation status ([place]) 08/20/2014  . S/P transmetatarsal amputation of foot ([place]) 07/13/2014  . Chronic toe ulcer ([place])   . Warfarin-induced coagulopathy ([place]) 05/08/2014  . HTN (hypertension) 05/08/2014  . PVC's (premature ventricular contractions) 05/08/2014  . Unstable angina ([place]) 05/07/2014  . First degree heart block 11/27/2013  . Acute on chronic combined systolic and diastolic CHF 76/16/0737  . COPD (chronic obstructive pulmonary disease) ([place]) 08/11/2013  . LBBB (left bundle branch block) 08/11/2013  . Critical lower limb ischemia 03/04/2013  . CAD - CABG '90 with re do '94  01/05/2013  . Tobacco abuse 01/05/2013  . PVD prior SFA PTA with chronic LE disease, not amenable to PTA 01/05/2013  . DM2 (diabetes mellitus, type 2) ([place]) 01/05/2013  . Accelerated hypertension on admission 01/05/2013  . Carotid stenosis 01/05/2013  . Hyperlipidemia with target LDL less than 70 01/05/2013  . Long term current use of anticoagulant therapy 11/11/2012  . H/O aortic valve replacement- St Jude 11/11/2012  . Colon cancer screening 05/02/2011  . Esophageal dysphagia 05/01/2011  . CLOSED FRACTURE OF METATARSAL BONE 09/17/2010    Past Surgical History:  Procedure Laterality Date  . AMPUTATION Left 07/13/2014   Procedure: Transmetatarsal Amputation;  Surgeon: [name], MD;  Location: [facility];   Service: Orthopedics;  Laterality: Left;  . AMPUTATION Left 08/20/2014   Procedure: Revision Transmetatarsal Amputation versus Below Knee Amputation;  Surgeon: [name], MD;  Location: [facility];  Service: Orthopedics;  Laterality: Left;  . ANGIOPLASTY ILLIAC ARTERY    . AORTIC VALVE REPLACEMENT  1990   St. Jude  . BACK SURGERY  [phone]   2  . BIOPSY N/A 06/20/2015   Procedure: BIOPSY;  Surgeon: Danie Binder, MD;  Location: [facility];  Service: Endoscopy;  Laterality: N/A;  . CATARACT EXTRACTION     bilateral  . COLONOSCOPY  05/2011   Dr. [name]: benign rectal polyp, left sided tics, ascending colonic ulcers (path c/w ischemia)  . COLONOSCOPY WITH PROPOFOL N/A 06/01/2015   RMR: normal appearing rectal mucosa. Scattered left-sided diverticula. the remainder of the colonic mucosa appeared normal. the distal 5 cm of terminal ileal mucosa also appeared normal. retroflexion was performed.   . CORONARY ARTERY BYPASS GRAFT  1994   6 vessels  . CORONARY  STENT PLACEMENT  2005   RCA vein graft A 3.0x13.0 TAXUS stent was then placed int he vessel a Viva 3.0x4.0 (perfusion balloon was made ready it was placed through the entire lenght of the stent  . ESOPHAGOGASTRODUODENOSCOPY N/A 02/09/2015   DR. Schooler: Normal EGD  . ESOPHAGOGASTRODUODENOSCOPY  05/2011   Dr. [name]: s/p esophageal dilation, antral erosions/nodularity with benign biopsies  . ESOPHAGOGASTRODUODENOSCOPY N/A 10/04/2015   Enderlin: 1 cm gastric cardia ulcer, AVMs in gastric body s/p APC therapy, 4 mm duodenal ulcer in 2nd portion of duodenum  . ESOPHAGOGASTRODUODENOSCOPY (EGD) WITH PROPOFOL N/A 06/20/2015   Procedure: ESOPHAGOGASTRODUODENOSCOPY (EGD) WITH PROPOFOL;  Surgeon: Danie Binder, MD;  Location: [facility];  Service: Endoscopy;  Laterality: N/A;  . EYE SURGERY    . GIVENS CAPSULE STUDY N/A 06/08/2015   Procedure: GIVENS CAPSULE STUDY;  Surgeon: [name], MD;  [facility];  Service: Endoscopy;  Laterality: N/A;   0700   . IR GENERIC HISTORICAL  06/06/2016   IR RADIOLOGIST EVAL & MGMT 06/06/2016 Corrie Mckusick, DO [facility] INTERV RAD  . LEFT HEART CATHETERIZATION WITH CORONARY ANGIOGRAM N/A 05/11/2014   Procedure: LEFT HEART CATHETERIZATION WITH CORONARY ANGIOGRAM;  Surgeon: [name], MD;  Location[facility] LAB;  Service: Cardiovascular;  Laterality: N/A;  . LOWER EXTREMITY ANGIOGRAM N/A 02/15/2013   Procedure: LOWER EXTREMITY ANGIOGRAM;  Surgeon: [name], MD;  Location: [facility] CATH LAB;  Service: Cardiovascular;  Laterality: N/A;  . LOWER EXTREMITY ANGIOGRAM N/A 06/06/2014   Procedure: LOWER EXTREMITY ANGIOGRAM;  Surgeon: [name], MD;  Location: [facility] CATH LAB;  Service: Cardiovascular;  Laterality: N/A;  . [name]  06/13/2011   Procedure: Venia Minks DILATION;  Surgeon: [name], MD;  Location: [facility];  Service: Endoscopy;  Laterality: N/A;  Dilated to 56.   [name] PERCUTANEOUS CORONARY STENT INTERVENTION (PCI-S) N/A 05/13/2014   Procedure: PERCUTANEOUS CORONARY STENT INTERVENTION (PCI-S);  Surgeon: [name], MD;  Location: [facility] CATH LAB;  Service: Cardiovascular;  Laterality: N/A;  . Peripheral vascular procedures lower extremities     Right external iliac  artery PTA and stenting as well as bilateral SFA intervention remotely. Repeat procedures in 2011 bilaterally  . ROTATOR CUFF REPAIR     right  . STUMP REVISION Left 09/23/2014   Procedure: Revision Left Below Knee Amputation;  Surgeon: [name], MD;  Location: [facility];  Service: Orthopedics;  Laterality: Left;  . STUMP REVISION Left 10/13/2014   Procedure: REVISION LEFT BELOW KNEE AMPUTATION STUMP;  Surgeon: [name], MD;  Location: [facility];  Service: Orthopedics;  Laterality: Left;       Home Medications    Prior to Admission medications   Medication Sig Start Date End Date Taking? Authorizing Provider  albuterol (PROVENTIL) 4 MG tablet Take 4 mg by mouth 2 (two) times daily.  01/20/15    [provider]  allopurinol (ZYLOPRIM) 300 MG tablet Take 300 mg by mouth daily.    [provider]  amoxicillin-clavulanate (AUGMENTIN) 875-125 MG tablet Take 1 tablet by mouth every 12 (twelve) hours. 04/08/17   Forde Dandy, MD  cholecalciferol (VITAMIN D) 1000 UNITS tablet Take 2,000 Units by mouth daily.    [provider]  COMBIVENT RESPIMAT 20-100 MCG/ACT AERS respimat Inhale 1 puff into the lungs every 6 (six) hours as needed for wheezing or shortness of breath.  02/18/17   [provider]  dexlansoprazole (DEXILANT) 60 MG capsule Take 1 capsule (60  mg total) by mouth daily. 10/23/16   Annitta Needs, NP  fenofibrate (TRICOR) 145 MG tablet TAKE 1 TABLET BY MOUTH ONCE DAILY FOR CHOLESTEROL. 04/11/17   Troy Sine, MD  fish oil-omega-3 fatty acids 1000 MG capsule Take 1 capsule (1 g total) by mouth 2 (two) times daily. 01/22/13   Alvstad, Casimiro Needle, RPH-CPP  furosemide (LASIX) 80 MG tablet Take 1 tablet (80 mg total) by mouth daily. 12/07/15   Kathie Dike, MD  glimepiride (AMARYL) 2 MG tablet Take 2 mg by mouth 2 (two) times daily. 03/20/17   [provider]  Insulin Detemir (LEVEMIR) 100 UNIT/ML Pen Inject 20 Units into the skin daily. Patient taking differently: Inject 30-40 Units into the skin 2 (two) times daily.  12/07/15   Kathie Dike, MD  Insulin Pen Needle 29G X 12.7MM MISC Use as directed 12/07/15   Kathie Dike, MD  isosorbide mononitrate (IMDUR) 60 MG 24 hr tablet TAKE ONE TABLET BY MOUTH ONCE DAILY. 06/12/16   Troy Sine, MD  metoprolol succinate (TOPROL-XL) 25 MG 24 hr tablet TAKE ONE TABLET BY MOUTH IN THE MORNING AND 1/2 TABLET IN THE EVENING. 04/11/17   Troy Sine, MD  NITROSTAT 0.4 MG SL tablet PLACE 1 TAB UNDER TONGUE EVERY 5 MIN IF NEEDED FOR CHEST PAIN. MAY USE 3 TIMES.NO RELIEF CALL 911. 07/20/15   Eileen Stanford, PA-C  oxyCODONE-acetaminophen (PERCOCET) 10-325 MG per tablet Take 1 tablet by mouth every 4 (four)  hours as needed for pain. Patient taking differently: Take 1 tablet by mouth every 3 (three) hours as needed for pain.  09/23/14   [name], MD  potassium chloride SA (K-DUR,KLOR-CON) 20 MEQ tablet Take 20 mEq by mouth daily.      [provider]  predniSONE (DELTASONE) 10 MG tablet Take 1 tablet (10 mg total) by mouth daily with breakfast. Take 6 tablets today and then decrease by 1 tablet daily until none are left. 03/12/17   Isaac Bliss, Rayford Halsted, MD  pregabalin (LYRICA) 50 MG capsule Take one capsule by mouth twice daily for pains Patient taking differently: Take 50 mg by mouth 3 (three) times daily.  08/23/14   Blanchie Serve, MD  rosuvastatin (CRESTOR) 20 MG tablet TAKE (1) TABLET BY MOUTH DAILY. 04/11/17   Troy Sine, MD  sitaGLIPtin (JANUVIA) 50 MG tablet Take 50 mg by mouth daily.    [provider]  vitamin C (ASCORBIC ACID) 500 MG tablet Take 500 mg by mouth daily.    [provider]  warfarin (COUMADIN) 5 MG tablet Take 1-1.5 tablets by mouth daily as directed by coumadin clinic Patient taking differently: Take 2.5-5 mg by mouth See admin instructions. Take 5mg  on Mondays and Fridays. Take 2.5mg  on all other days 12/23/16   Troy Sine, MD    Family History Family History  Problem Relation Age of Onset  . Diabetes Father   . Heart failure Other   . Colon cancer Neg Hx   . Liver disease Neg Hx     Social History Social History  Substance Use Topics  . Smoking status: Former Smoker    Packs/day: 1.00    Years: 55.00    Types: Cigarettes    Start date: 08/19/1953    Quit date: 05/07/2014  . Smokeless tobacco: Current User    Types: Chew    Last attempt to quit: 08/20/1995     Comment: Has quit on 3 occasions. Counseling given today 5-10  minutes   I am more than likely going to quit "  . Alcohol use No     Comment: Socially, sometimes 12 ounce beer daily, may go month without/no whiskey     Allergies   Patient has no known  allergies.   Review of Systems Review of Systems  Constitutional: Positive for appetite change.  HENT: Negative.   Eyes: Negative.   Respiratory: Positive for cough, choking and shortness of breath.   Cardiovascular: Negative for chest pain and leg swelling.  Gastrointestinal: Negative for abdominal pain, blood in stool, diarrhea, nausea and vomiting.  Endocrine: Negative.   Genitourinary: Negative.   Musculoskeletal: Negative.   Neurological: Positive for light-headedness.  Hematological: Negative.   Psychiatric/Behavioral: Negative.   All other systems reviewed and are negative.    Physical Exam Updated Vital Signs BP (!) 99/56 (BP Location: Left Arm)   Pulse 69   Temp 98.2 F (36.8 C) (Oral)   Resp (!) 27   Ht 1.778 m (5\' 10" )   Wt 90.3 kg (199 lb)   SpO2 98%   BMI 28.55 kg/m   Physical Exam  Constitutional: He is oriented to person, place, and time. He appears well-developed.  Chronically ill-appearing male who does not appear to be in acute distress  HENT:  Head: Normocephalic and atraumatic.  Right Ear: External ear normal.  Left Ear: External ear normal.  Mucous membranes are dry  Eyes: Pupils are equal, round, and reactive to light.  Neck: Normal range of motion.  Cardiovascular: An irregularly irregular rhythm present.  Pulmonary/Chest: No respiratory distress.  Decreased breath sounds throughout with some rales in the left lower lung field  Abdominal: Soft. Bowel sounds are normal.  Musculoskeletal:  Left BKA right lower extremity with some swelling and pitting edema  Neurological: He is alert and oriented to person, place, and time. No cranial nerve deficit.  Skin: Skin is warm. Capillary refill takes less than 2 seconds.  Psychiatric: He has a normal mood and affect.  Nursing note and vitals reviewed.    ED Treatments / Results  Labs (all labs ordered are listed, but only abnormal results are displayed) Labs Reviewed - No data to  display  EKG  EKG Interpretation  Date/Time:  Monday April 28 2017 15:02:59 EDT Ventricular Rate:  78 PR Interval:    QRS Duration: 158 QT Interval:  396 QTC Calculation: 452 R Axis:   -14 Text Interpretation:  Atrial fibrillation Paired ventricular premature complexes IVCD, consider atypical LBBB Confirmed by Pattricia Boss [phone]) on 04/28/2017 3:31:21 PM       Radiology Dg Chest Port 1 View  Result Date: 04/28/2017 CLINICAL DATA:  Cough and hemoptysis EXAM: PORTABLE CHEST 1 VIEW COMPARISON:  Chest radiograph and chest CT April 08, 2017 FINDINGS: There is airspace consolidation in the right mid lung and left base regions. There are small pleural effusions bilaterally. There is cardiomegaly with mild pulmonary venous hypertension. There is aortic atherosclerosis. 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 No evident adenopathy. There is evidence of old trauma involving the lateral right clavicle. IMPRESSION: Airspace consolidation, likely multifocal pneumonia, in the right mid lung and left base regions. There is evidence of pulmonary vascular congestion with small left pleural effusion. There is aortic atherosclerosis. 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 Followup PA and lateral chest radiographs recommended in 3-4 weeks following trial of antibiotic therapy to ensure resolution and exclude underlying malignancy. Aortic Atherosclerosis (ICD10-I70.0). Electronically Signed  By: Lowella Grip III M.D.   On: 04/28/2017 15:48    Procedures Procedures (including critical care time)  Medications Ordered in ED Medications - No data to display   Initial Impression / Assessment and Plan / ED Course  I have reviewed the triage vital signs and the nursing notes.  Pertinent labs & imaging results that were available during my care of the patient were reviewed by me and considered in my medical decision making (see chart for  details).      This is a 76 year old man withcoronary artery disease, COPD, recently diagnosed with lung mass who presents today with increased dyspnea and hemoptysis. Here he is noted to have chest x-Linkin Vizzini consistent with multilobar pneumonia- consider post obstructive and aspiration.  Patient oxygenating on his usual home oxygen.  Zosyn and vanc given here.  Initially with soft bp at 99/56 with increase to 121/57 after 500 cc ns.  Lactic acid normal.  Hemocult positive, but no gross blood.  INR pending.  Hgb at 12.   INR 4.5 with heme positive stool and hemoptysis but hgb stable, patient with chronic a fib.  No plan to reverse at this time but will hold coumadin for now.  Final Clinical Impressions(s) / ED Diagnoses   Final diagnoses:  Pneumonia of both lungs due to infectious organism, unspecified part of lung  Lung mass  Heme positive stool    New Prescriptions New Prescriptions   No medications on file     Pattricia Boss, MD 04/28/17 1656    Pattricia Boss, MD 04/28/17 2205

## 2017-04-28 NOTE — ED Notes (Signed)
Patient on 2 LPM of oxygen via , sats 96%.

## 2017-04-28 NOTE — ED Triage Notes (Signed)
cbg with ems was 113.

## 2017-04-28 NOTE — ED Triage Notes (Signed)
Per ems, pt being worked up for cancer but pt doesn't know what kind.   Today started vomiting coffee ground emesis and family called ems. When ems arrived, pt had rales and wheezing.  EMS administered 2 albuterol treatments and put pt on cpap.  Pt has 20g IV in Left ac that was started by ems.  Pt alert and oriented.

## 2017-04-29 DIAGNOSIS — E119 Type 2 diabetes mellitus without complications: Secondary | ICD-10-CM

## 2017-04-29 DIAGNOSIS — R791 Abnormal coagulation profile: Secondary | ICD-10-CM

## 2017-04-29 DIAGNOSIS — J189 Pneumonia, unspecified organism: Secondary | ICD-10-CM

## 2017-04-29 LAB — BLOOD GAS, ARTERIAL
ACID-BASE EXCESS: 8.3 mmol/L — AB (ref 0.0–2.0)
BICARBONATE: 31.2 mmol/L — AB (ref 20.0–28.0)
DRAWN BY: 33100
O2 Content: 2 L/min
O2 Saturation: 97.3 %
PATIENT TEMPERATURE: 98
pCO2 arterial: 53.6 mmHg — ABNORMAL HIGH (ref 32.0–48.0)
pH, Arterial: 7.407 (ref 7.350–7.450)
pO2, Arterial: 94.1 mmHg (ref 83.0–108.0)

## 2017-04-29 LAB — GLUCOSE, CAPILLARY
GLUCOSE-CAPILLARY: 107 mg/dL — AB (ref 65–99)
GLUCOSE-CAPILLARY: 120 mg/dL — AB (ref 65–99)
GLUCOSE-CAPILLARY: 54 mg/dL — AB (ref 65–99)
GLUCOSE-CAPILLARY: 108 mg/dL — AB (ref 65–99)
Glucose-Capillary: 35 mg/dL — CL (ref 65–99)
Glucose-Capillary: 55 mg/dL — ABNORMAL LOW (ref 65–99)
Glucose-Capillary: 69 mg/dL (ref 65–99)
Glucose-Capillary: 130 mg/dL — ABNORMAL HIGH (ref 65–99)

## 2017-04-29 LAB — BASIC METABOLIC PANEL
ANION GAP: 6 (ref 5–15)
BUN: 22 mg/dL — ABNORMAL HIGH (ref 6–20)
CHLORIDE: 98 mmol/L — AB (ref 101–111)
CO2: 34 mmol/L — AB (ref 22–32)
Calcium: 8.5 mg/dL — ABNORMAL LOW (ref 8.9–10.3)
Creatinine, Ser: 0.71 mg/dL (ref 0.61–1.24)
GFR calc Af Amer: >60 mL/min (ref >60)
GFR calc non Af Amer: >60 mL/min (ref >60)
GLUCOSE: 53 mg/dL — AB (ref 65–99)
POTASSIUM: 3.5 mmol/L (ref 3.5–5.1)
Sodium: 138 mmol/L (ref 135–145)

## 2017-04-29 LAB — CBC WITH DIFFERENTIAL/PLATELET
Basophils Absolute: 0 10*3/uL (ref 0.0–0.1)
Basophils Relative: 0 %
Eosinophils Absolute: 0.1 10*3/uL (ref 0.0–0.7)
Eosinophils Relative: 1 %
HEMATOCRIT: 36.8 % — AB (ref 39.0–52.0)
HEMOGLOBIN: 11.9 g/dL — AB (ref 13.0–17.0)
LYMPHS ABS: 1.4 10*3/uL (ref 0.7–4.0)
LYMPHS PCT: 10 %
MCH: 28.9 pg (ref 26.0–34.0)
MCHC: 32.3 g/dL (ref 30.0–36.0)
MCV: 89.3 fL (ref 78.0–100.0)
MONO ABS: 1.2 10*3/uL — AB (ref 0.1–1.0)
MONOS PCT: 9 %
NEUTROS ABS: 11 10*3/uL — AB (ref 1.7–7.7)
Neutrophils Relative %: 81 %
Platelets: 109 10*3/uL — ABNORMAL LOW (ref 150–400)
RBC: 4.12 MIL/uL — ABNORMAL LOW (ref 4.22–5.81)
RDW: 15.4 % (ref 11.5–15.5)
WBC: 13.6 10*3/uL — ABNORMAL HIGH (ref 4.0–10.5)

## 2017-04-29 LAB — PROTIME-INR
INR: 3.92
PROTHROMBIN TIME: 38.1 s — AB (ref 11.4–15.2)

## 2017-04-29 LAB — STREP PNEUMONIAE URINARY ANTIGEN: Strep Pneumo Urinary Antigen: NEGATIVE

## 2017-04-29 LAB — EXPECTORATED SPUTUM ASSESSMENT W GRAM STAIN, RFLX TO RESP C: Special Requests: NORMAL

## 2017-04-29 LAB — EXPECTORATED SPUTUM ASSESSMENT W REFEX TO RESP CULTURE

## 2017-04-29 LAB — HEMOGLOBIN A1C
Hgb A1c MFr Bld: 6.5 % — ABNORMAL HIGH (ref 4.8–5.6)
Mean Plasma Glucose: 139.85 mg/dL

## 2017-04-29 MED ORDER — INSULIN DETEMIR 100 UNIT/ML ~~LOC~~ SOLN: 15.0000 [IU] | Freq: Every day | SUBCUTANEOUS | Status: DC

## 2017-04-29 MED ORDER — ORAL CARE MOUTH RINSE
15.0000 mL | Freq: Two times a day (BID) | OROMUCOSAL | Status: DC
  Administered 2017-04-29 – 2017-05-01 (×4): 15 mL via OROMUCOSAL

## 2017-04-29 MED ORDER — ACETAMINOPHEN 650 MG RE SUPP
650.0000 mg | Freq: Four times a day (QID) | RECTAL | Status: DC | PRN
  Administered 2017-04-29: 650 mg via RECTAL
  Filled 2017-04-29: qty 1

## 2017-04-29 MED ORDER — DEXTROSE 10 % IV SOLN
INTRAVENOUS | Status: DC
  Administered 2017-04-29 – 2017-04-30 (×2): via INTRAVENOUS

## 2017-04-29 MED ORDER — DEXTROSE 50 % IV SOLN
INTRAVENOUS | Status: AC
  Administered 2017-04-29: 25 mL
  Filled 2017-04-29: qty 50

## 2017-04-29 MED ORDER — PANTOPRAZOLE SODIUM 40 MG IV SOLR
40.0000 mg | INTRAVENOUS | Status: DC
  Administered 2017-04-29 – 2017-04-30 (×2): 40 mg via INTRAVENOUS
  Filled 2017-04-29 (×2): qty 40

## 2017-04-29 MED ORDER — PIPERACILLIN-TAZOBACTAM 3.375 G IVPB
3.3750 g | Freq: Three times a day (TID) | INTRAVENOUS | Status: DC
  Administered 2017-04-29 – 2017-05-01 (×7): 3.375 g via INTRAVENOUS
  Filled 2017-04-29 (×6): qty 50

## 2017-04-29 MED ORDER — INSULIN DETEMIR 100 UNIT/ML ~~LOC~~ SOLN
15.0000 [IU] | Freq: Two times a day (BID) | SUBCUTANEOUS | Status: DC
  Filled 2017-04-29 (×3): qty 0.15

## 2017-04-29 MED ORDER — METOPROLOL TARTRATE 5 MG/5ML IV SOLN
5.0000 mg | Freq: Four times a day (QID) | INTRAVENOUS | Status: DC
  Administered 2017-04-29 – 2017-04-30 (×2): 5 mg via INTRAVENOUS
  Filled 2017-04-29 (×4): qty 5

## 2017-04-29 NOTE — Consult Note (Addendum)
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 He started coughing up blood about 5 days ago. He has been very weak. He has decreased strength. He's not eating very well. He's had a lot of swelling. He's not had any fever at home. He has been using oxygen. He has lost a substantial amount of weight. He was hospitalized in July with community-acquired pneumonia. He then came back to the hospital in August and was discovered to have lung mass by CT. He is scheduled for PET/CT and scheduled for CT-guided biopsy for definitive tissue diagnosis. His chest x-ray on admission shows multilobar pneumonia. He is coagulopathic with supratherapeutic INR. He was hypotensive on admission. He is still coughing up sputum. This morning he has low blood sugar so history may not be reliable. He says he is still short of breath. He is still coughing up some sputum. His sputum is definitely bloody.  Past Medical History:  Diagnosis Date  . Acute blood loss anemia 02/2015 & 05/2015   Hemoglobin 5.5 on 05/31/15; status post transfusion.  . Arthritis   . CAD (coronary artery disease)    a. 05/13/14 [name] s/p overlapping DESx2 to SVG to RCA. b.  s/p CABG '90 with redo '94 & stent to RCA SVG in 2005  . Chronic back pain   . Chronic systolic heart failure ([place])   . Chronic toe ulcer ([place])    a. Left foot  . COPD (chronic obstructive pulmonary disease) ([place])   . Critical lower limb ischemia   . Dysphagia   . Essential hypertension   . GERD (gastroesophageal reflux disease)   . GI bleeding 05/31/2015   Source not identified.  . Gout   . History  of kidney stones   . Hypercholesteremia   . Lung mass    probable lung cancer - pending IR biopsy  . Myocardial infarction ([place])   . Neuromuscular disorder ([place])   . Peripheral neuropathy   . Peripheral vascular disease ([place])    Lower extremity PCI/stenting  . S/P aortic valve replacement 1990   a. [employer]  . Sleep apnea   . Type 2 diabetes mellitus ([place]) 2007     Family History  Problem Relation Age of Onset  . Diabetes Father   . Heart failure Other   . Colon cancer Neg Hx   . Liver disease Neg Hx      Social History   Social History  . Marital status: Legally Separated    Spouse name: N/A  . Number of children: 5  . Years of education: N/A   Occupational History  . 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 Smoking status: Former Smoker    Packs/day: 1.00    Years: 55.00    Types: Cigarettes    Start date: 08/19/1953    Quit date: 05/07/2014  . Smokeless tobacco: Current User    Types: Chew    Last attempt to quit: 08/20/1995     Comment: Has quit on 3 occasions. Counseling given today 5-10 minutes   I am more than likely going to quit "  . Alcohol use No     Comment: Socially, sometimes 12 ounce  beer daily, may go month without/no whiskey  . Drug use: No  . Sexual activity: Not Currently   Other Topics Concern  . None   Social History Narrative   Has 3 daughters   Has 2 sons     ROS: Except as mentioned 10 point review of systems is negative    Objective: Vital signs in last 24 hours: Temp:  [98 F (36.7 C)-100.2 F (37.9 C)] 98 F (36.7 C) (09/10 2117) Pulse Rate:  [67-122] 68 (09/10 2147) Resp:  [21-28] 22 (09/10 2117) BP: (87-132)/(48-61) 119/48 (09/10 2147) SpO2:  [92 %-100 %] 96 % (09/11 0752) Weight:  [88.9 kg (195 lb 15.8 oz)-90.3 kg (199 lb)] 88.9 kg (195 lb 15.8 oz) (09/10 2117) Weight change:  Last BM Date: 04/27/17  Intake/Output from previous day: 09/10 0701 - 09/11 0700 In: 860 [P.O.:120; IV  Piggyback:740] Out: -   PHYSICAL EXAM Constitutional: He looks chronically sick. He looks very weak. Eyes: Pupils react. EOMI. Ears nose mouth and throat: His mucous membranes are dry. Throat is clear. He is mildly hard of hearing. Cardiovascular: He has prosthetic heart valve sounds. No gallop. He has fairly diffuse edema. Respiratory: His respiratory effort is increased. His lungs show bilateral rhonchi. Gastrointestinal: His abdomen is soft with no masses. Skin: No rash. 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 Strength grossly normal. Neurological: He is sleepy this morning as his blood sugars being replaced. Psychiatric: Not able to assess  Lab Results: Basic Metabolic Panel:  Recent Labs  04/28/17 1556 04/29/17 0418  NA 138 138  K 3.7 3.5  CL 97* 98*  CO2 33* 34*  GLUCOSE 67 53*  BUN 26* 22*  CREATININE 0.70 0.71  CALCIUM 8.8* 8.5*   Liver Function Tests:  Recent Labs  04/28/17 1556  AST 28  ALT 17  ALKPHOS 26*  BILITOT 1.4*  PROT 5.6*  ALBUMIN 2.7*   No results for input(s): LIPASE, AMYLASE in the last 72 hours. No results for input(s): AMMONIA in the last 72 hours. CBC:  Recent Labs  04/28/17 1556 04/29/17 0418  WBC 14.0* 13.6*  NEUTROABS 11.8* 11.0*  HGB 12.0* 11.9*  HCT 37.3* 36.8*  MCV 89.0 89.3  PLT 114* 109*   Cardiac Enzymes: No results for input(s): CKTOTAL, CKMB, CKMBINDEX, TROPONINI in the last 72 hours. BNP: No results for input(s): PROBNP in the last 72 hours. D-Dimer: No results for input(s): DDIMER in the last 72 hours. CBG:  Recent Labs  04/28/17 2145 04/29/17 0801 04/29/17 0828  GLUCAP 115* 35* 107*   Hemoglobin A1C: No results for input(s): HGBA1C in the last 72 hours. Fasting Lipid Panel: No results for input(s): CHOL, HDL, LDLCALC, TRIG, CHOLHDL, LDLDIRECT in the last 72 hours. Thyroid Function Tests: No results for input(s): TSH, T4TOTAL, FREET4, T3FREE, THYROIDAB in the last 72 hours. Anemia Panel: No results  for input(s): VITAMINB12, FOLATE, FERRITIN, TIBC, IRON, RETICCTPCT in the last 72 hours. Coagulation:  Recent Labs  04/28/17 1556 04/29/17 0418  LABPROT 41.4* 38.1*  INR 4.35* 3.92   Urine Drug Screen: Drugs of Abuse  No results found for: LABOPIA, COCAINSCRNUR, LABBENZ, AMPHETMU, THCU, LABBARB  Alcohol Level: No results for input(s): ETH in the last 72 hours. Urinalysis: No results for input(s): COLORURINE, LABSPEC, PHURINE, GLUCOSEU, HGBUR, BILIRUBINUR, KETONESUR, PROTEINUR, UROBILINOGEN, NITRITE, LEUKOCYTESUR in the last 72 hours.  Invalid input(s): APPERANCEUR Misc. Labs:   ABGS: No results for input(s): PHART, PO2ART, TCO2, HCO3 in the last 72 hours.  Invalid input(s):  PCO2   MICROBIOLOGY: Recent Results (from the past 240 hour(s))  Culture, blood (x 2)     Status: None (Preliminary result)   Collection Time: 04/28/17 10:43 PM  Result Value Ref Range Status   Specimen Description BLOOD RIGHT ARM  Final   Special Requests   Final    BOTTLES DRAWN AEROBIC AND ANAEROBIC Blood Culture adequate volume   Culture PENDING  Incomplete   Report Status PENDING  Incomplete  Culture, blood (x 2)     Status: None (Preliminary result)   Collection Time: 04/28/17 10:45 PM  Result Value Ref Range Status   Specimen Description BLOOD RIGHT HAND  Final   Special Requests   Final    BOTTLES DRAWN AEROBIC AND ANAEROBIC Blood Culture adequate volume   Culture PENDING  Incomplete   Report Status PENDING  Incomplete    Studies/Results: Dg Chest Port 1 View  Result Date: 04/28/2017 CLINICAL DATA:  Cough and hemoptysis EXAM: PORTABLE CHEST 1 VIEW COMPARISON:  Chest radiograph and chest CT April 08, 2017 FINDINGS: There is airspace consolidation in the right mid lung and left base regions. There are small pleural effusions bilaterally. There is cardiomegaly with mild pulmonary venous hypertension. There is aortic atherosclerosis. There is evidence of previous coronary artery bypass  grafting. No evident adenopathy. There is evidence of old trauma involving the lateral right clavicle. IMPRESSION: Airspace consolidation, likely multifocal pneumonia, in the right mid lung and left base regions. There is evidence of pulmonary vascular congestion with small left pleural effusion. There is aortic atherosclerosis. 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 Followup PA and lateral chest radiographs recommended in 3-4 weeks following trial of antibiotic therapy to ensure resolution and exclude underlying malignancy. Aortic Atherosclerosis (ICD10-I70.0). Electronically Signed   By: Lowella Grip III M.D.   On: 04/28/2017 15:48    Medications:  Prior to Admission:  Prescriptions Prior to Admission  Medication Sig Dispense Refill Last Dose  . albuterol (PROVENTIL) 4 MG tablet Take 4 mg by mouth 3 (three) times daily.   04/28/2017 at 1100  . allopurinol (ZYLOPRIM) 300 MG tablet Take 300 mg by mouth daily.   04/26/2017 at 2130  . cholecalciferol (VITAMIN D) 1000 UNITS tablet Take 2,000 Units by mouth daily.   04/28/2017 at 1100  . COMBIVENT RESPIMAT 20-100 MCG/ACT AERS respimat Inhale 1 puff into the lungs every 6 (six) hours as needed for wheezing or shortness of breath.    Past Month at Unknown time  . dexlansoprazole (DEXILANT) 60 MG capsule Take 1 capsule (60 mg total) by mouth daily. 90 capsule 3 04/28/2017 at 1100  . fenofibrate (TRICOR) 145 MG tablet TAKE 1 TABLET BY MOUTH ONCE DAILY FOR CHOLESTEROL. 30 tablet 0 04/26/2017 at 2130  . fish oil-omega-3 fatty acids 1000 MG capsule Take 1 capsule (1 g total) by mouth 2 (two) times daily.   04/28/2017 at 1100  . furosemide (LASIX) 80 MG tablet Take 1 tablet (80 mg total) by mouth daily. 30 tablet 0 04/27/2017 at Unknown time  . glimepiride (AMARYL) 2 MG tablet Take 2 mg by mouth 2 (two) times daily.   04/28/2017 at 1100  . Insulin Detemir (LEVEMIR) 100 UNIT/ML Pen Inject 20 Units into the skin daily. (Patient  taking differently: Inject 30-40 Units into the skin 2 (two) times daily. ) 15 mL 11 04/28/2017 at 1100  . Insulin Pen Needle 29G X 12.7MM MISC Use as directed 30 each 0 04/28/2017 at 1100  . isosorbide  mononitrate (IMDUR) 60 MG 24 hr tablet TAKE ONE TABLET BY MOUTH ONCE DAILY. 90 tablet 3 04/28/2017 at 1100  . metoprolol succinate (TOPROL-XL) 25 MG 24 hr tablet TAKE ONE TABLET BY MOUTH IN THE MORNING AND 1/2 TABLET IN THE EVENING. 45 tablet 0 04/28/2017 at 1100  . NITROSTAT 0.4 MG SL tablet PLACE 1 TAB UNDER TONGUE EVERY 5 MIN IF NEEDED FOR CHEST PAIN. MAY USE 3 TIMES.NO RELIEF CALL 911. 25 tablet 4 Taking  . oxyCODONE-acetaminophen (PERCOCET) 10-325 MG per tablet Take 1 tablet by mouth every 4 (four) hours as needed for pain. (Patient taking differently: Take 1 tablet by mouth every 3 (three) hours as needed for pain. ) 30 tablet 0 04/28/2017 at 1100  . potassium chloride SA (K-DUR,KLOR-CON) 20 MEQ tablet Take 20 mEq by mouth daily.     04/26/2017 at 2130  . pregabalin (LYRICA) 50 MG capsule Take one capsule by mouth twice daily for pains (Patient taking differently: Take 50 mg by mouth 3 (three) times daily. ) 60 capsule 5 04/28/2017 at 1100  . ramipril (ALTACE) 2.5 MG capsule Take 2.5 mg by mouth at bedtime.    04/26/2017 at 2130  . ramipril (ALTACE) 5 MG capsule Take 5 mg by mouth daily.   04/28/2017 at 1100  . rosuvastatin (CRESTOR) 20 MG tablet TAKE (1) TABLET BY MOUTH DAILY. 90 tablet 0 04/26/2017 at 2130  . sitaGLIPtin (JANUVIA) 50 MG tablet Take 50 mg by mouth daily.   04/28/2017 at 1100  . vitamin C (ASCORBIC ACID) 500 MG tablet Take 500 mg by mouth daily.   04/28/2017 at 1100  . warfarin (COUMADIN) 5 MG tablet Take 1-1.5 tablets by mouth daily as directed by coumadin clinic (Patient taking differently: Take 2.5-5 mg by mouth See admin instructions. Take 5mg  on Mondays and Fridays. Take 2.5mg  on all other days) 40 tablet 1 04/26/2017 at 2130   Scheduled: . allopurinol  300 mg Oral Daily  . fenofibrate   160 mg Oral QHS  . furosemide  80 mg Intravenous Q12H  . insulin aspart  0-15 Units Subcutaneous TID WC  . insulin aspart  0-5 Units Subcutaneous QHS  . insulin detemir  30 Units Subcutaneous BID  . ipratropium-albuterol  3 mL Nebulization Q6H  . isosorbide mononitrate  60 mg Oral Daily  . mouth rinse  15 mL Mouth Rinse BID  . metoprolol succinate  25 mg Oral Daily   And  . metoprolol succinate  12.5 mg Oral QHS  . pantoprazole  40 mg Oral Daily  . potassium chloride SA  20 mEq Oral Daily  . predniSONE  40 mg Oral Q breakfast  . pregabalin  50 mg Oral TID  . ramipril  2.5 mg Oral QHS  . ramipril  5 mg Oral Daily  . rosuvastatin  20 mg Oral q1800   Continuous: . ceFEPime (MAXIPIME) IV Stopped (04/28/17 2216)  . vancomycin Stopped (04/29/17 0998)   PJA:[id number], oxyCODONE-acetaminophen **AND** oxyCODONE  Assesment:Chest x-ray that I personally reviewed shows that he has multifocal pneumonia. I think this is very likely from aspiration. He is increasingly weak. He's coughing up blood which I think is a combination of his pneumonia and his anticoagulation particularly with supratherapeutic INR. He has a lung mass which is presumably lung cancer and he has a lot of constitutional symptoms likely associated with that. This morning he is hypoglycemic. At baseline he has diabetes. He has obstructive sleep apnea. He has had aortic valve replacement and is chronically anticoagulated.  He has COPD and is having an acute exacerbation associated with the pneumonia. He also has chronic heart failure. Principal Problem:   Multifocal pneumonia Active Problems:   Chronic anticoagulation   Elevated INR   Anemia, iron deficiency   Controlled type 2 diabetes mellitus without complication (HCC)   Sepsis (HCC)   Mass of upper lobe of right lung   Thrombocytopenia ([place])    Plan: He has oral prednisone ordered but with my concern about aspiration I'm going to switch him to Solu-Medrol. Because of my  concern that he's aspirated I'm going to switch him from cefepime to Zosyn. Speech evaluation. Continue other treatments.Agree with palliative care consultation as he may not be a good candidate for any sort of treatment regardless of the diagnosis of his lung mass considering his recent turn of events    LOS: 1 day   Dragon Thrush L 04/29/2017, 8:39 AM

## 2017-04-29 NOTE — Progress Notes (Signed)
Pharmacy Antibiotic Note  Tanner Pena is a 76 y.o. male admitted on 04/28/2017 with pneumonia.  Pharmacy has been consulted for Vancomycin and Zosyn dosing.  Plan: Vancomycin 1gm IV every 8 hours.  Goal trough 15-20 mcg/mL.Zosyn 3.375gm IV every 8 hours. Follow-up micro data, labs, vitals.  Height: 5\' 10"  (177.8 cm) Weight: 195 lb 15.8 oz (88.9 kg) IBW/kg (Calculated) : 73  Temp (24hrs), Avg:98.8 F (37.1 C), Min:98 F (36.7 C), Max:100.2 F (37.9 C)   Recent Labs Lab 04/28/17 1556 04/28/17 1610 04/29/17 0418  WBC 14.0*  --  13.6*  CREATININE 0.70  --  0.71  LATICACIDVEN  --  1.15  --     Estimated Creatinine Clearance: 88.2 mL/min (by C-G formula based on SCr of 0.71 mg/dL).    No Known Allergies  Antimicrobials this admission: Vanc 9/10 >>  Cefepime 9/10 >> 9/11 Zosyn 9/10 x 1, 9/11 >>  Dose adjustments this admission: n/a   Microbiology results: 9/10 BCx: pending  UCx:   9/10 Sputum: pending   MRSA PCR:   Thank you for allowing pharmacy to be a part of this patient's care.  Pricilla Larsson 04/29/2017 8:58 AM

## 2017-04-29 NOTE — Progress Notes (Signed)
PT Cancellation Note  Patient Details Name: Tanner Pena MRN: [id number] DOB: [birth year]   Cancelled Treatment:    Reason Eval/Treat Not Completed: Medical issues which prohibited therapy;Fatigue/lethargy limiting ability to participate (Chart reviewed, RN consulted. Pt addressed at bedside, attempted evaluation, but pt is excessively lethargic, hypophonic, and with breathlessness that interferes with communication, physically ill in appearance. Will hold and attempt again at later date)   11:51 AM, 04/29/17 Etta Grandchild, PT, DPT Physical Therapist - [place] [phone] (Office)    Buccola,Allan C 04/29/2017, 11:51 AM

## 2017-04-29 NOTE — Evaluation (Signed)
Clinical/Bedside Swallow Evaluation Patient Details  Name: Tanner Pena MRN: [id number] Date of Birth: [birth year]  Today's Date: 04/29/2017 Time: SLP Start Time (ACUTE ONLY): 2119 SLP Stop Time (ACUTE ONLY): 1748 SLP Time Calculation (min) (ACUTE ONLY): 46 min  Past Medical History:  Past Medical History:  Diagnosis Date  . Acute blood loss anemia 02/2015 & 05/2015   Hemoglobin 5.5 on 05/31/15; status post transfusion.  . Arthritis   . CAD (coronary artery disease)    a. 05/13/14 [name] s/p overlapping DESx2 to SVG to RCA. b.  s/p CABG '90 with redo '94 & stent to RCA SVG in 2005  . Chronic back pain   . Chronic systolic heart failure ([place])   . Chronic toe ulcer ([place])    a. Left foot  . COPD (chronic obstructive pulmonary disease) ([place])   . Critical lower limb ischemia   . Dysphagia   . Essential hypertension   . GERD (gastroesophageal reflux disease)   . GI bleeding 05/31/2015   Source not identified.  . Gout   . History of kidney stones   . Hypercholesteremia   . Lung mass    probable lung cancer - pending IR biopsy  . Myocardial infarction ([place])   . Neuromuscular disorder ([place])   . Peripheral neuropathy   . Peripheral vascular disease ([place])    Lower extremity PCI/stenting  . S/P aortic valve replacement 1990   a. St. Jude  . Sleep apnea   . Type 2 diabetes mellitus ([place]) 2007   Past Surgical History:  Past Surgical History:  Procedure Laterality Date  . AMPUTATION Left 07/13/2014   Procedure: Transmetatarsal Amputation;  Surgeon: [name], MD;  Location: [place];  Service: Orthopedics;  Laterality: Left;  . AMPUTATION Left 08/20/2014   Procedure: Revision Transmetatarsal Amputation versus Below Knee Amputation;  Surgeon: [name], MD;  Location: [place];  Service: Orthopedics;  Laterality: Left;  . ANGIOPLASTY ILLIAC ARTERY    . AORTIC VALVE REPLACEMENT  1990   St. Jude  . BACK SURGERY  [phone]   2  . BIOPSY N/A 06/20/2015   Procedure: BIOPSY;  Surgeon:  Danie Binder, MD;  Location: [facility];  Service: Endoscopy;  Laterality: N/A;  . CATARACT EXTRACTION     bilateral  . COLONOSCOPY  05/2011   Dr. [name]: benign rectal polyp, left sided tics, ascending colonic ulcers (path c/w ischemia)  . COLONOSCOPY WITH PROPOFOL N/A 06/01/2015   RMR: normal appearing rectal mucosa. Scattered left-sided diverticula. the remainder of the colonic mucosa appeared normal. the distal 5 cm of terminal ileal mucosa also appeared normal. retroflexion was performed.   . CORONARY ARTERY BYPASS GRAFT  1994   6 vessels  . CORONARY STENT PLACEMENT  2005   RCA vein graft A 3.0x13.0 TAXUS stent was then placed int he vessel a Viva 3.0x4.0 (perfusion balloon was made ready it was placed through the entire lenght of the stent  . ESOPHAGOGASTRODUODENOSCOPY N/A 02/09/2015   DR. Schooler: Normal EGD  . ESOPHAGOGASTRODUODENOSCOPY  05/2011   Dr. [name]: s/p esophageal dilation, antral erosions/nodularity with benign biopsies  . 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 ESOPHAGOGASTRODUODENOSCOPY (EGD) WITH PROPOFOL N/A 06/20/2015   Procedure: ESOPHAGOGASTRODUODENOSCOPY (EGD) WITH PROPOFOL;  Surgeon: Danie Binder, MD;  Location: [facility];  Service: Endoscopy;  Laterality: N/A;  . EYE SURGERY    .  GIVENS CAPSULE STUDY N/A 06/08/2015   Procedure: GIVENS CAPSULE STUDY;  Surgeon: [name], MD;  [facility];  Service: Endoscopy;  Laterality: N/A;  0700   . IR GENERIC HISTORICAL  06/06/2016   IR RADIOLOGIST EVAL & MGMT 06/06/2016 Corrie Mckusick, DO [facility] INTERV RAD  . LEFT HEART CATHETERIZATION WITH CORONARY ANGIOGRAM N/A 05/11/2014   Procedure: LEFT HEART CATHETERIZATION WITH CORONARY ANGIOGRAM;  Surgeon: [name], MD;  Location: [facility] CATH LAB;  Service: Cardiovascular;  Laterality: N/A;  . LOWER EXTREMITY ANGIOGRAM N/A 02/15/2013    Procedure: LOWER EXTREMITY ANGIOGRAM;  Surgeon: [name], MD;  Location[facility] CATH LAB;  Service: Cardiovascular;  Laterality: N/A;  . LOWER EXTREMITY ANGIOGRAM N/A 06/06/2014   Procedure: LOWER EXTREMITY ANGIOGRAM;  Surgeon: [name], MD;  Location: [facility] CATH LAB;  Service: Cardiovascular;  Laterality: N/A;  . [name]  06/13/2011   Procedure: Venia Minks DILATION;  Surgeon: [name], MD;  Location: [facility];  Service: Endoscopy;  Laterality: N/A;  Dilated to 56.   [name] PERCUTANEOUS CORONARY STENT INTERVENTION (PCI-S) N/A 05/13/2014   Procedure: PERCUTANEOUS CORONARY STENT INTERVENTION (PCI-S);  Surgeon: [name], MD;  Location: [place] CATH LAB;  Service: Cardiovascular;  Laterality: N/A;  . Peripheral vascular procedures lower extremities     Right external iliac  artery PTA and stenting as well as bilateral SFA intervention remotely. Repeat procedures in 2011 bilaterally  . ROTATOR CUFF REPAIR     right  . STUMP REVISION Left 09/23/2014   Procedure: Revision Left Below Knee Amputation;  Surgeon: [name], MD;  Location: [place];  Service: Orthopedics;  Laterality: Left;  . 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 He started coughing up blood about 5 days ago. He has been very weak. He has decreased strength. He's not eating very well. He's had a lot of swelling. He's not had any fever at home. He has been using oxygen. He has lost a substantial amount of weight. He was hospitalized in July with community-acquired  pneumonia. He then came back to the hospital in August and was discovered to have lung mass by CT. He is scheduled for PET/CT and scheduled for CT-guided biopsy for definitive tissue diagnosis. His chest x-ray on admission shows multilobar pneumonia. He is coagulopathic with supratherapeutic INR. He was hypotensive on admission. He is still coughing up sputum. This morning he has low blood sugar so history may not be reliable. He says he is still short of breath. He is still coughing up some sputum. His sputum is definitely bloody.   Assessment / Plan / Recommendation Clinical Impression  Pt was evaluated at bedside by means of a clinical swallowing evaluation. Pt presents with lethargic affect and nursing reports his sugar was at 54 at the time of evaluation. Pt is edentulous and presented with wet and gurgling breathing at baseline.  Pt was able to cough hard and expectorate bloody sputum. Pt expectorated ~4 large clumps of red sputum although the wet gurgling sound with every breath did not clear but only temporarily (~4-5 seconds). Pt  was given one ice chip, 3 tsp sips of HTL and 4 tsp bites of puree (apple sauce); all presentations despite cues to "cough hard" and throat clear resulted in continued wetness and overt s/sx of aspiration and poor protection of airway. Question if pt is silently aspirating his own secretions/sputum and all/most PO presentations. D/t severe risk of aspiration and overt s/sx of poor airway protection recommend continue NPO status pending MBS as schedule permits 6/38. Family was present for evaluation and was educated of pt's severe risk of aspiration and the reason for continued recommendation for NPO status. Note recommendation for palliative consult and encourage this recommendation.  SLP Visit Diagnosis: Dysphagia, unspecified (R13.10)    Aspiration Risk  Severe aspiration risk    Diet Recommendation NPO   Medication Administration: Via alternative means    Other   Recommendations Recommended Consults:  (Palliative Consult) Oral Care Recommendations: Oral care QID;Staff/trained caregiver to provide oral care Other Recommendations: Have oral suction available   Follow up Recommendations        Frequency and Duration min 2x/week  2 weeks       Prognosis Prognosis for Safe Diet Advancement: Guarded Barriers to Reach Goals: Severity of deficits      Swallow Study   General Date of Onset: 04/28/17 HPI: This is a 76 year old who has multiple medical problems including COPD recent diagnosis of lung mass hyper tension, obstructive sleep apnea, diabetes, previous aortic valve replacement on chronic anticoagulation, 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 He started coughing up blood about 5 days ago. He has been very weak. He has decreased strength. He's not eating very well. He's had a lot of swelling. He's not had any fever at home. He has been using oxygen. He has lost a substantial amount of weight. He was hospitalized in July with community-acquired pneumonia. He then came back to the hospital in August and was discovered to have lung mass by CT. He is scheduled for PET/CT and scheduled for CT-guided biopsy for definitive tissue diagnosis. His chest x-ray on admission shows multilobar pneumonia. He is coagulopathic with supratherapeutic INR. He was hypotensive on admission. He is still coughing up sputum. This morning he has low blood sugar so history may not be reliable. He says he is still short of breath. He is still coughing up some sputum. His sputum is definitely bloody. Type of Study: Bedside Swallow Evaluation Previous Swallow Assessment: none Diet Prior to this Study: NPO Temperature Spikes Noted: No Respiratory Status: Nasal cannula (2 L/Mn) History of Recent Intubation: No Behavior/Cognition: Alert;Cooperative;Lethargic/Drowsy Oral Cavity Assessment: Dry;Dried secretions Oral Care  Completed by SLP: Recent completion by staff Oral Cavity - Dentition: Edentulous Vision: Functional for self-feeding Self-Feeding Abilities: Total assist Patient Positioning: Upright in bed Baseline Vocal Quality: Wet Volitional Cough: Strong;Wet;Congested Volitional Swallow: Able to elicit    Oral/Motor/Sensory Function     Ice Chips Ice chips: Impaired Presentation: Spoon Pharyngeal Phase Impairments: Suspected delayed Swallow;Multiple swallows;Wet Vocal Quality;Throat Clearing - Delayed   Thin Liquid      Nectar Thick     Honey Thick Honey Thick Liquid: Impaired Presentation: Cup Pharyngeal Phase Impairments: Suspected delayed Swallow;Multiple swallows;Wet Vocal Quality;Throat Clearing - Delayed;Cough - Immediate   Puree Puree: Impaired Presentation: Spoon Pharyngeal Phase Impairments: Suspected delayed Swallow;Wet Vocal Quality;Throat Clearing - Delayed;Cough - Immediate   Solid   Marguerita Stapp H. Roddie Mc, [facility] Speech Language Pathologist  Wende Bushy 04/29/2017,6:04 PM

## 2017-04-29 NOTE — Progress Notes (Signed)
Inpatient Diabetes Program Recommendations  AACE/ADA: New Consensus Statement on Inpatient Glycemic Control (2015)  Target Ranges:  Prepandial:   less than 140 mg/dL      Peak postprandial:   less than 180 mg/dL (1-2 hours)      Critically ill patients:  140 - 180 mg/dL   Results for Tanner Pena, Tanner Pena (MRN [id number]) as of 04/29/2017 13:39  Ref. Range 04/28/2017 21:45 04/29/2017 08:01 04/29/2017 08:28 04/29/2017 09:28 04/29/2017 10:00 04/29/2017 11:30  Glucose-Capillary Latest Ref Range: 65 - 99 mg/dL 115 (H) 35 (LL) 107 (H) 55 (L) 120 (H) 69   Review of Glycemic Control  Diabetes history: DM2 Outpatient Diabetes medications: Amaryl 2 mg BID, Januvia 50 mg daily, Levemir 8 units QAM, Levemir 10 units QPM Current orders for Inpatient glycemic control: Novolog 0-15 units TID with meals, Novolog 0-5 units QHS  Inpatient Diabetes Program Recommendations:  Outpatient DM medications: Outpatient DM medications need to be adjusted at time of discharge. Insulin-Correction: Please consider decreasing Novolog correction to sensitive scale.  NOTE: Spoke with patient and his daughter about diabetes and home regimen for diabetes control. Patient having a hard time talking and only able to whisper.  Patient's daughter reports that he is followed by Dr. Gerarda Fraction for diabetes management. Over the past few weeks, patient has not been able to eat much and his glucose has been running low and his insulin dosages were changed to Levemir 8 units QAM and 10 units QPM (was 30-40 units). Patient's daughter reports that she has not had to give the Levemir morning dose over the past week because glucose is usually low in mornings and she has to give him [name] to drink to get glucose up. She also reports that she has not had to give the evening dose every night over the past week (she doesn't give it if his glucose is less than 80 mg/dl already). Discussed Levemir and Amaryl and how they work for DM control and explained risk of  hypoglycemia with both.  Explained that DM medications most likely need to adjusted to prevent reoccurrence of hypoglycemia.  Patient's daughter asked if MD could provide a sliding scale for the Levemir dose so she will know how much she should give patient based on glucose levels.  Encouraged patient's daughter to reach out to Dr. Gerarda Fraction if he glucose continues to run low at home. Patient's daughter verbalized understanding of information discussed and she states that she has no further questions at this time related to diabetes.  Thanks, Barnie Alderman, RN, MSN, CDE Diabetes Coordinator Inpatient Diabetes Program [phone] (Team Pager)

## 2017-04-29 NOTE — Progress Notes (Addendum)
[facility] PROGRESS NOTE  Tanner Pena GHW:[id number] DOB: Mar 29, 1941 DOA: 04/28/2017   PCP: Redmond School, MD     Assessment/Plan: Principal Problem:   Multifocal pneumonia Active Problems:   Chronic anticoagulation   Elevated INR   Anemia, iron deficiency   Controlled type 2 diabetes mellitus without complication (HCC)   Sepsis (HCC)   Mass of upper lobe of right lung   Thrombocytopenia ([place])    76 year old who has multiple medical problems including COPD recent diagnosis of lung mass hyper tension, obstructive sleep apnea, diabetes, previous aortic valve replacement on chronic anticoagulation, 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 He started coughing up blood about 5 days ago. Recently diagnosed with lung cancer,scheduled for PET/CT and scheduled for CT-guided biopsy for definitive tissue diagnosis. His chest x-ray on admission shows multilobar pneumonia/sepsis.   Assessment and plan Multifocal PNA with sepsis  -While awaiting blood cultures, this appears to be a preseptic condition. -Sepsis protocol initiated Postobstructive versus aspiration pneumonia -Other etiologies include aspiration versus URI (most likely viral). -This is his 3rd such admission since March. -CURB-65 score is 3+, meaning that the patient has a 14.5% risk of death. -Pneumonia Severity Index (PSI) is Class 4, 9% mortality. -Corticosteroids have been to shown to low overall mortality rate; risk of ARDS; and need for mechanical ventilation.  This is particularly true in severe PNA (class 3+ PSI).  Will add 40 mg prednisone daily for 3-7 days. -The patient has the following criteria for MDR (multi-drug resistance): IV antibiotics in the last 90 days; >2 days of inpatient treatment within the last 90 days; SNF placement. -Changed Cefepime to Zosyn, continue Vancomycin. -ABG pH 7.4, PCO2 53.6, PO2 of 94 Very tenuous respiratory  status SLP eval  Severe cardiomyopathy Recent echo shows EF of 20-25% Hold ACE inhibitor/Altace Continue IV beta blocker Overall prognosis remains poor  Chronic anticoagulation with elevated INR -Afib, currently controlled, supratherapeutic INR  Follow INR closely  Hypoglycemia Started patient on D10 NPO pending speech therapy evaluation  Lung Mass Likely not a candidate for biopsy, poor functional status, pulmonary following -Palliative care consult -Suspect that hypoalbuminemia is related to malignancy and is causing some anasarca - primarily of left hand and right LE    DM, currently hypoglycemic, discontinued Levemir -Will check A1c - it was 6.8 in 2/17 -hold Amaryl Continue sliding scale insulin  Thrombocytopenia -Appears to be new -Likely related to infection/sepsis -Will follow   DVT prophylaxsis Coumadin  Code Status:  DNR    Code Status Orders        Start     Ordered    Family Communication: Discussed in detail with the patient's daughter about him  declining and most likely Will be needing hospice in the next few days,not a candidate for invasive procedures, given multiple comorbidities, high aspiration risk , all imaging results, lab results explained to the patient   Disposition Plan: Likely will need prolonged hospitalization overall poor prognosis     Consultants:  Pulmonary  Procedures:  None  Antibiotics: Anti-infectives    Start     Dose/Rate Route Frequency Ordered Stop   04/29/17 1000  piperacillin-tazobactam (ZOSYN) IVPB 3.375 g     3.375 g 12.5 mL/hr over 240 Minutes Intravenous Every 8 hours 04/29/17 0900     04/29/17 0200  vancomycin (VANCOCIN) IVPB 1000 mg/200 mL premix     1,000 mg 200 mL/hr over 60 Minutes Intravenous Every 8  hours 04/28/17 2100     04/28/17 2200  ceFEPIme (MAXIPIME) 2 g in dextrose 5 % 50 mL IVPB  Status:  Discontinued     2 g 100 mL/hr over 30 Minutes Intravenous Every 12 hours 04/28/17 2100  04/29/17 0900   04/28/17 1630  piperacillin-tazobactam (ZOSYN) IVPB 3.375 g     3.375 g 100 mL/hr over 30 Minutes Intravenous  Once 04/28/17 1627 04/28/17 1850   04/28/17 1630  vancomycin (VANCOCIN) IVPB 1000 mg/200 mL premix     1,000 mg 200 mL/hr over 60 Minutes Intravenous  Once 04/28/17 1627 04/28/17 1957         HPI/Subjective: Barely arousable to sternal rub, noted to be hypoglycemic twice today, unable to handle oral secretions  Objective: Vitals:   04/28/17 2117 04/28/17 2147 04/29/17 0231 04/29/17 0752  BP: (!) 87/51 (!) 119/48    Pulse: 77 68    Resp: (!) 22     Temp: 98 F (36.7 C)     TempSrc: Oral     SpO2: 94%  92% 96%  Weight: 88.9 kg (195 lb 15.8 oz)     Height:        Intake/Output Summary (Last 24 hours) at 04/29/17 0948 Last data filed at 04/29/17 1308  Gross per 24 hour  Intake              860 ml  Output                0 ml  Net              860 ml    Exam:  Examination:  General exam: Chronically ill, appears to be a huge aspiration risk Respiratory system: Clear to auscultation. Respiratory effort normal. Cardiovascular system: S1 & S2 heard, RRR. No JVD, murmurs, rubs, gallops or clicks. No pedal edema. Gastrointestinal system: Abdomen is nondistended, soft and nontender. No organomegaly or masses felt. Normal bowel sounds heard. Central nervous system: Barely arousable to sternal rub Extremities:  Strength not tested Skin: No rashes, lesions or ulcers Psychiatry: Judgement impaired    Data Reviewed: I have personally reviewed following labs and imaging studies  Micro Results Recent Results (from the past 240 hour(s))  Culture, blood (x 2)     Status: None (Preliminary result)   Collection Time: 04/28/17 10:43 PM  Result Value Ref Range Status   Specimen Description BLOOD RIGHT ARM  Final   Special Requests   Final    BOTTLES DRAWN AEROBIC AND ANAEROBIC Blood Culture adequate volume   Culture NO GROWTH < 12 HOURS  Final   Report  Status PENDING  Incomplete  Culture, blood (x 2)     Status: None (Preliminary result)   Collection Time: 04/28/17 10:45 PM  Result Value Ref Range Status   Specimen Description BLOOD RIGHT HAND  Final   Special Requests   Final    BOTTLES DRAWN AEROBIC AND ANAEROBIC Blood Culture adequate volume   Culture NO GROWTH < 12 HOURS  Final   Report Status PENDING  Incomplete    Radiology Reports Dg Chest 2 View  Result Date: 04/08/2017 CLINICAL DATA:  Productive cough and congestion EXAM: CHEST  2 VIEW COMPARISON:  03/10/2017 FINDINGS: Cardiac shadow is mildly enlarged. Postsurgical changes are again seen. Fullness is noted in the right hilar region although stable from prior exams and consistent with a prominent pulmonary artery and patient rotation to the right. No focal infiltrate is noted. Chronic changes in the left base  are seen. No new focal abnormality is noted. IMPRESSION: Chronic changes in the left base although significantly improved from previous exams. Fullness in the right hilar region consistent with prominent pulmonary artery Electronically Signed   By: Inez Catalina M.D.   On: 04/08/2017 16:14   Ct Head W Wo Contrast  Result Date: 04/23/2017 CLINICAL DATA:  New right upper lobe lung mass. New onset headaches. Daily persistent headaches. 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 EXAM: CT HEAD WITHOUT AND WITH CONTRAST TECHNIQUE: Contiguous axial images were obtained from the base of the skull through the vertex without and with intravenous contrast CONTRAST:  27mL ISOVUE-300 IOPAMIDOL (ISOVUE-300) INJECTION 61% COMPARISON:  None. FINDINGS: Brain: Mild atrophy and moderate white matter changes are noted bilaterally. A lacunar infarct involving the left caudate head appears remote. The basal ganglia are otherwise intact. The brainstem and cerebellum are normal. No focal mass lesion or enhancement is present. There is no acute hemorrhage. Vascular: Dense vascular calcifications  present at the cavernous internal carotid artery is bilaterally. 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 Dense calcifications are present at the dural margin of the vertebral arteries bilaterally. There is no hyperdense vessel. Skull: The calvarium is intact. No focal lytic or blastic lesions are present. Sinuses/Orbits: The paranasal sinuses and mastoid air cells are clear. The patient is status post bilateral lens replacements. The globes and orbits are within normal limits. IMPRESSION: 1. No evidence for metastatic disease to the brain. 2. 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 Of note, dense calcifications are present in the more proximal left cavernous internal carotid artery as well. 3. Extensive atherosclerotic change. These results were called by telephone at the time of interpretation on 04/23/2017 at 10:00 am to Dr. Twana First , who verbally acknowledged these results. Electronically Signed   By: San Morelle M.D.   On: 04/23/2017 10:06   Ct Angio Chest Pe W And/or Wo Contrast  Result Date: 04/08/2017 CLINICAL DATA:  Shortness of breath.  Cough. EXAM: CT ANGIOGRAPHY CHEST WITH CONTRAST TECHNIQUE: Multidetector CT imaging of the chest was performed using the standard protocol during bolus administration of intravenous contrast. Multiplanar CT image reconstructions and MIPs were obtained to evaluate the vascular anatomy. CONTRAST:  100 mL Isovue 370 nonionic COMPARISON:  Chest radiograph April 08, 2017 FINDINGS: Cardiovascular: There is no evident pulmonary embolus. Note that there is narrowing of the right upper lobe pulmonary artery branches due to tumor encasement. There is no appreciable thoracic adenopathy or dissection. There is extensive atherosclerotic calcification in the proximal great vessels, most severe in the left subclavian artery were there is hemodynamically significant obstruction approximately 2 cm distal  to the origin of this vessel. There is extensive atherosclerotic calcification throughout the aorta. There is extensive native coronary artery calcification. Patient is status post coronary artery bypass grafting. Pericardium is not appreciably thickened. Mediastinum/Nodes: Thyroid appears unremarkable. There is adenopathy at multiple sites in the chest. In the anterior mediastinum, there are several enlarged lymph nodes, largest measuring 2.7 x 2.3 cm. Anterior to the distal trachea, there is a lymph node measuring 2.8 x 2.6 cm. 1.8 x 1.8 cm. To the left of the carina, there is adenopathy measuring 2.4 x 2.5 cm. In the right hilum, there is adenopathy in measuring 2.9 x 2.5 cm. In the sub- carinal region, there are several enlarged lymph nodes. Largest lymph node measures 2.6 x 2.2 cm.  A lymph node posterior to the carina more inferiorly on the right measures 2.9 x 1.7 cm. No esophageal lesions are evident. Lungs/Pleura: There is calcification along the posterior parietal pleural on the left consistent with previous asbestos exposure. There is a mass which surrounds right pulmonary artery branches which is felt to arise from the medial aspect of the posterior segment right upper lobe and extends into the hilum. This lesion measures 4.6 x 4.6 cm. In the apex on the right, there is an irregular mass with a somewhat bubbly central appearance measuring 1.7 x 1.0 cm. This appearance is concerning for neoplastic etiology. There is a nearby 6 mm nodular opacity in the right apex seen on axial slice 16 series 6. On axial slice 29 series 6, there is a 3 mm nodular opacity in the posterior segment of the right upper lobe. There is atelectatic change in both bases, more on the left than on the right. No pleural effusion evident. Upper Abdomen: In the visualized upper abdominal region, there is cholelithiasis. Gallbladder wall does not appear appreciably thickened. There is atherosclerotic calcification right renal artery as  well as in the origins of the superior mesenteric and celiac arteries. There is a small mass in the left adrenal measuring 1.1 x 0.7 cm. This small mass has an attenuation value of 34 Hounsfield units. A small metastasis in the left adrenal cannot be excluded. The right adrenal appears unremarkable. Liver is mildly lobular in contour, a finding that potentially could indicate a degree of underlying hepatic cirrhosis. Musculoskeletal: There is degenerative change in the thoracic spine. There are no evident blastic or lytic bone lesions. Patient is status post median sternotomy. Review of the MIP images confirms the above findings. IMPRESSION: 1.  No evident pulmonary embolus. 2. Right upper lobe mass surrounding and encasing upper lobe pulmonary artery is. This mass extends into the right superior hilar region. This mass is consistent with neoplasm. 3. Nodular lesion with somewhat bubbly central appearance in the right apex measuring 1.7 x 1.0 cm. Suspect second focus of parenchymal lung neoplasm. 4.  Extensive multifocal adenopathy. 5. 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 6. Extensive aortic atherosclerosis. Patient is status post coronary artery bypass grafting. Hemodynamically significant obstruction in the proximal left subclavian artery, celiac artery, superior mesenteric artery, and right renal artery. Other areas of atherosclerotic calcification noted. 7.  Cholelithiasis. 8. Contour of the liver raises concern for a degree of underlying hepatic cirrhosis. Aortic Atherosclerosis (ICD10-I70.0). Electronically Signed   By: Lowella Grip III M.D.   On: 04/08/2017 19:40   Dg Chest Port 1 View  Result Date: 04/28/2017 CLINICAL DATA:  Cough and hemoptysis EXAM: PORTABLE CHEST 1 VIEW COMPARISON:  Chest radiograph and chest CT April 08, 2017 FINDINGS: There is airspace consolidation in the right mid lung and left base regions. There are small pleural effusions bilaterally.  There is cardiomegaly with mild pulmonary venous hypertension. There is aortic atherosclerosis. 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 No evident adenopathy. There is evidence of old trauma involving the lateral right clavicle. IMPRESSION: Airspace consolidation, likely multifocal pneumonia, in the right mid lung and left base regions. There is evidence of pulmonary vascular congestion with small left pleural effusion. There is aortic atherosclerosis. 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 Followup PA and lateral chest radiographs recommended in 3-4 weeks following trial of antibiotic therapy to ensure resolution and exclude underlying malignancy. Aortic Atherosclerosis (ICD10-I70.0). Electronically Signed  By: Lowella Grip III M.D.   On: 04/28/2017 15:48     CBC  Recent Labs Lab 04/28/17 1556 04/29/17 0418  WBC 14.0* 13.6*  HGB 12.0* 11.9*  HCT 37.3* 36.8*  PLT 114* 109*  MCV 89.0 89.3  MCH 28.6 28.9  MCHC 32.2 32.3  RDW 15.0 15.4  LYMPHSABS 1.0 1.4  MONOABS 1.1* 1.2*  EOSABS 0.0 0.1  BASOSABS 0.0 0.0    Chemistries   Recent Labs Lab 04/28/17 1556 04/29/17 0418  NA 138 138  K 3.7 3.5  CL 97* 98*  CO2 33* 34*  GLUCOSE 67 53*  BUN 26* 22*  CREATININE 0.70 0.71  CALCIUM 8.8* 8.5*  AST 28  --   ALT 17  --   ALKPHOS 26*  --   BILITOT 1.4*  --    ------------------------------------------------------------------------------------------------------------------ estimated creatinine clearance is 88.2 mL/min (by C-G formula based on SCr of 0.71 mg/dL). ------------------------------------------------------------------------------------------------------------------ No results for input(s): HGBA1C in the last 72 hours. ------------------------------------------------------------------------------------------------------------------ No results for input(s): CHOL, HDL, LDLCALC, TRIG, CHOLHDL, LDLDIRECT in the  last 72 hours. ------------------------------------------------------------------------------------------------------------------ No results for input(s): TSH, T4TOTAL, T3FREE, THYROIDAB in the last 72 hours.  Invalid input(s): FREET3 ------------------------------------------------------------------------------------------------------------------ No results for input(s): VITAMINB12, FOLATE, FERRITIN, TIBC, IRON, RETICCTPCT in the last 72 hours.  Coagulation profile  Recent Labs Lab 04/28/17 1556 04/29/17 0418  INR 4.35* 3.92    No results for input(s): DDIMER in the last 72 hours.  Cardiac Enzymes No results for input(s): CKMB, TROPONINI, MYOGLOBIN in the last 168 hours.  Invalid input(s): CK ------------------------------------------------------------------------------------------------------------------ Invalid input(s): POCBNP   CBG:  Recent Labs Lab 04/28/17 2145 04/29/17 0801 04/29/17 0828 04/29/17 0928  GLUCAP 115* 35* 107* 55*       Studies: Dg Chest Port 1 View  Result Date: 04/28/2017 CLINICAL DATA:  Cough and hemoptysis EXAM: PORTABLE CHEST 1 VIEW COMPARISON:  Chest radiograph and chest CT April 08, 2017 FINDINGS: There is airspace consolidation in the right mid lung and left base regions. There are small pleural effusions bilaterally. There is cardiomegaly with mild pulmonary venous hypertension. There is aortic atherosclerosis. 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 No evident adenopathy. There is evidence of old trauma involving the lateral right clavicle. IMPRESSION: Airspace consolidation, likely multifocal pneumonia, in the right mid lung and left base regions. There is evidence of pulmonary vascular congestion with small left pleural effusion. There is aortic atherosclerosis. 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 Followup PA and lateral chest radiographs recommended in 3-4 weeks following  trial of antibiotic therapy to ensure resolution and exclude underlying malignancy. Aortic Atherosclerosis (ICD10-I70.0). Electronically Signed   By: Lowella Grip III M.D.   On: 04/28/2017 15:48      Lab Results  Component Value Date   HGBA1C 6.8 (H) 09/30/2015   HGBA1C 7.0 (H) 08/07/2015   HGBA1C 6.2 (H) 05/31/2015   Lab Results  Component Value Date   LDLCALC 62 05/09/2014   CREATININE 0.71 04/29/2017       Scheduled Meds: . allopurinol  300 mg Oral Daily  . fenofibrate  160 mg Oral QHS  . furosemide  80 mg Intravenous Q12H  . insulin aspart  0-15 Units Subcutaneous TID WC  . insulin aspart  0-5 Units Subcutaneous QHS  . insulin detemir  15 Units Subcutaneous QHS  . ipratropium-albuterol  3 mL Nebulization Q6H  . isosorbide mononitrate  60 mg Oral Daily  . mouth rinse  15 mL Mouth Rinse  BID  . metoprolol succinate  25 mg Oral Daily   And  . metoprolol succinate  12.5 mg Oral QHS  . pantoprazole  40 mg Oral Daily  . potassium chloride SA  20 mEq Oral Daily  . pregabalin  50 mg Oral TID  . ramipril  2.5 mg Oral QHS  . ramipril  5 mg Oral Daily  . rosuvastatin  20 mg Oral q1800   Continuous Infusions: . dextrose    . piperacillin-tazobactam (ZOSYN)  IV    . vancomycin Stopped (04/29/17 0337)     LOS: 1 day    Time spent: >30 MINS    Reyne Dumas  [facility] Pager [phone]. If 7PM-7AM, please contact night-coverage at [email or web address], password [facility] 04/29/2017, 9:48 AM  LOS: 1 day

## 2017-04-30 ENCOUNTER — Encounter (HOSPITAL_COMMUNITY): Payer: Self-pay | Admitting: Primary Care

## 2017-04-30 DIAGNOSIS — Z515 Encounter for palliative care: Secondary | ICD-10-CM

## 2017-04-30 DIAGNOSIS — Z7189 Other specified counseling: Secondary | ICD-10-CM

## 2017-04-30 DIAGNOSIS — R918 Other nonspecific abnormal finding of lung field: Secondary | ICD-10-CM

## 2017-04-30 LAB — BASIC METABOLIC PANEL
Anion gap: 10 (ref 5–15)
BUN: 22 mg/dL — AB (ref 6–20)
CHLORIDE: 92 mmol/L — AB (ref 101–111)
CO2: 32 mmol/L (ref 22–32)
CREATININE: 1.23 mg/dL (ref 0.61–1.24)
Calcium: 8.5 mg/dL — ABNORMAL LOW (ref 8.9–10.3)
GFR calc Af Amer: >60 mL/min (ref >60)
GFR calc non Af Amer: 55 mL/min — ABNORMAL LOW (ref >60)
Glucose, Bld: 166 mg/dL — ABNORMAL HIGH (ref 65–99)
POTASSIUM: 3.2 mmol/L — AB (ref 3.5–5.1)
Sodium: 134 mmol/L — ABNORMAL LOW (ref 135–145)

## 2017-04-30 LAB — GLUCOSE, CAPILLARY
GLUCOSE-CAPILLARY: 121 mg/dL — AB (ref 65–99)
GLUCOSE-CAPILLARY: 152 mg/dL — AB (ref 65–99)
GLUCOSE-CAPILLARY: 91 mg/dL (ref 65–99)
Glucose-Capillary: 146 mg/dL — ABNORMAL HIGH (ref 65–99)
Glucose-Capillary: 179 mg/dL — ABNORMAL HIGH (ref 65–99)
Glucose-Capillary: 102 mg/dL — ABNORMAL HIGH (ref 65–99)

## 2017-04-30 LAB — PROTIME-INR
INR: 3.16
Prothrombin Time: 32.2 seconds — ABNORMAL HIGH (ref 11.4–15.2)

## 2017-04-30 MED ORDER — KCL IN DEXTROSE-NACL 40-5-0.9 MEQ/L-%-% IV SOLN
INTRAVENOUS | Status: DC
  Administered 2017-04-30: 08:00:00 via INTRAVENOUS

## 2017-04-30 MED ORDER — MORPHINE SULFATE (PF) 2 MG/ML IV SOLN
2.0000 mg | INTRAVENOUS | Status: DC | PRN
  Administered 2017-04-30 (×2): 2 mg via INTRAVENOUS
  Filled 2017-04-30 (×2): qty 1

## 2017-04-30 MED ORDER — MORPHINE SULFATE (CONCENTRATE) 10 MG/0.5ML PO SOLN
2.5000 mg | ORAL | Status: DC | PRN
  Administered 2017-04-30 – 2017-05-01 (×6): 2.6 mg via ORAL
  Filled 2017-04-30 (×6): qty 0.5
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
## 2017-04-30 NOTE — Progress Notes (Signed)
Initial Nutrition Assessment  DOCUMENTATION CODES:   Severe malnutrition in context of chronic illness  INTERVENTION:  - Monitor for diet advancement and provide medically appropriate nutritional intervention  NUTRITION DIAGNOSIS:   Malnutrition (severe) related to chronic illness (COPD, Lung mass) as evidenced by severe depletion of muscle mass, moderate depletions of muscle mass, energy intake < 75% for > or equal to 1 month, percent weight loss (15% in less than 6 months).  GOAL:   Patient will meet greater than or equal to 90% of their needs  MONITOR:   Diet advancement, Labs, I & O's, Skin  REASON FOR ASSESSMENT:   Consult Assessment of nutrition requirement/status  ASSESSMENT:   Pt with PMH of Type II DM, Gout, Hypercholesteremia, PVD, COPD on home O2 (2L), CAD, OSA, GERD, CHF presented with progressive weakness and multifocal PNA with sepsis. Pt was hospitalized for CAP 7/21-25. Pt with recent diagnosis of lung mass 08/21 after presenting to ER. Per MD note does not believe he is a candidate for biopsy given his condition.   Per chart, pt has been coughing up blood and aspirating.  Spoke with pt and pt's oldest daughter. Pt lives with daughter and she prepares his meals.  Pt's daughter reports pt has had a decreased appetite. Reporting pt has been consuming only 2-3 spoonfuls at each meal. Pt's daughter reports this has gone on for over 3 weeks. No meal completion percentages available for pt given NPO status.  Per pt's daughter, pt has had significant weight loss recently. Reporting 40 lb weight loss in 2 months. Per chart review pt's weight history has continuously declined over the past year. Pt has lost 35 lbs since 11/28/16. This is 15% weight loss in less than 6 months, significant for time frame.   Labs reviewed; CBG 35-179, Na 134, K 3.2, A1C 6.5 Medications reviewed; Sliding scale insulin, Protonix, 50 mL D5  Nutrition focused physical exam completed. Findings  include no fat depletion, moderate to severe muscle depletion, and moderate edema.  Diet Order:  Diet NPO time specified  Skin:  Wound (see comment) (non pressure wound at R buttock)  Last BM:  04/27/17  Height:   Ht Readings from Last 1 Encounters:  04/28/17 5\' 10"  (1.778 m)    Weight:   Wt Readings from Last 1 Encounters:  04/28/17 195 lb 15.8 oz (88.9 kg)    Ideal Body Weight:  75.5 kg  BMI:  Body mass index is 28.12 kg/m.  Estimated Nutritional Needs:   Kcal:  2200-2500  Protein:  105-110 grams  Fluid:  >/= 2.2 L/d  EDUCATION NEEDS:   Education needs no appropriate at this time  Par[place] Ranger, MS, RDN, LDN 04/30/2017 11:28 AM

## 2017-04-30 NOTE — Care Management (Signed)
Patient Information   Patient Name Tanner Pena, Tanner Pena ([id number]) Sex Male DOB 01-03-1941  Room Bed  A337 A337-01  Patient Demographics   Address [place] [address] Phone [phone] (Home)  Patient Ethnicity & Race   Ethnic Group Patient Race  Not Hispanic or Latino White or Caucasian  Emergency Contact(s)   Name Relation Home Work Mobile  Chilton,Patricia Friend [place] Daughter [phone]  [phone]  Hall,Amber Daughter [phone]  [phone]  Documents on File    Status Date Received Description  Documents for the Patient  [place] Received 10/18/10 E  [place] E-Signature HIPAA Notice of Privacy Received 08/02/11   [place] E-Signature HIPAA Notice of Privacy Spanish Not Received    Driver's License Not Received    Advance Directives/Living Will/HCPOA/POA Not Received    EMR Problem Summary Not Received    EMR Patient Summary Not Received    [place] HIPAA NOTICE OF PRIVACY - Scanned Not Received    EMR Patient Summary Not Received    Insurance Card Received 05/01/11   Insurance Card Received 02/11/13 [place]/MEDICAID  Financial Application Not Received    AMB Correspondence Not Received  07/12 office note [facility]  AMB Correspondence Not Received  09/12 Office Note [facility]  Driver's License Not Received    AMB Correspondence Not Received  01/13 Alfredo Martinez, [place] Card Not Received    [facility] E-Signature HIPAA Notice of Privacy Received 12/29/12   Insurance Card Not Received    Insurance Card Received 12/29/12 [facility]  Insurance Card Not Received    AMB Correspondence Not Received  09/14 order [facility]   AMB Correspondence Not Received  09/14 order [employer] Card  10/20/13 [email or web address]/nl/tst  AMB Provider Completed Forms  10/21/13 02/15 auth order [facility]  HIM ROI Authorization  02/25/14   HIM ROI Authorization  05/31/14  ECS representing [employer]/ [place] (Expired) 06/10/14 Fax demographics, ov notes, angiogram and arterial doppler  HIM ROI Authorization  06/22/14 Patient Accounting - Deidra Thompson  AMB Correspondence  06/27/14 H&P WILSON MD, A  AMB Correspondence  06/27/14 D/S WILSON MD, A  AMB HH/NH/Hospice  32/67/12 CERTIFICATION/POC [facility]  AMB Correspondence  06/28/14 D/S WILSON PA, A  AMB Correspondence  06/27/14 H&P WILSON PA, A  Insurance Card  08/29/14   AMB HH/NH/Hospice  07/02/14 POC [facility]  AMB Provider Completed Forms  09/29/14 AUTH TO RENEW/CHG STANDING ORDE [facility]  AMB HH/NH/Hospice  10/10/14 01/16-02/16 ORDER [facility]  Insurance Card Received 10/13/14 UHC & MEDICAID C/A  [place] HIPAA NOTICE OF PRIVACY - Scanned  10/15/14   AMB HH/NH/Hospice  09/26/14 ORDER [facility]  AMB HH/NH/Hospice  10/17/14 ORDERS [facility]  AMB HH/NH/Hospice  10/10/14 ORDER [facility]  AMB HH/NH/Hospice  10/04/14 ORDER [facility]  HIM ROI Authorization  11/17/14 Patient Accounting - Lovina Reach  AMB HH/NH/Hospice  10/31/14 ORDER [facility]  AMB HH/NH/Hospice  11/07/14 ORDERS [facility]  Other Photo ID Not Received    AMB HH/NH/Hospice  45/80/99 CERTIFICATION/POC [facility]  AMB HH/NH/Hospice  09/07/14 GENERAL ORDER [place]  AMB HH/NH/Hospice  11/21/14 ORDER [facility]  AMB HH/NH/Hospice  10/24/14 ORDER [facility]  AMB Provider Completed Forms  02/14/15 [place] STANDING ORDER [facility]  HIM ROI Authorization  02/23/15   HIM ROI Authorization  04/03/15 patient accounting - deidra thompson  AMB Provider Completed Forms  03/23/15 AUTHO TO RENEW/CHANGE ORDERS [facility]  HIM ROI Authorization  04/11/15   HIM ROI Authorization (Expired) 04/20/15 Authorization for batch [facility]/[employer]Care     fbg     04/19/15  HIM ROI  Authorization  05/17/15   Insurance Card Received 05/18/15 [facility]  [place] HIPAA NOTICE OF PRIVACY - Scanned Received 05/18/15 [facility] IR FORM  AMB Correspondence  06/02/15 REFERRAL SAMS NP, A.  Insurance Card Received 06/06/15 [facility]  AMB Correspondence  06/08/15 AUTHORIZATION UHC  AMB Correspondence  06/06/15 SERVICES APPROVED UNITED HEALTHCARE  HIM ROI Authorization  07/16/15 Managed Care request  AMB Provider Completed Forms  08/28/15 STANDING ORDER [facility]  AMB Correspondence  09/18/15 AUTH TO RENERW OR CHANGE ORDERS [facility]  Release of Information Received 11/08/15 DPR signed 11-07-15 [facility]  [place] HIPAA NOTICE OF PRIVACY - Scanned Received 11/29/15 HIPAA/[facility]  Insurance Card   UHC [employer] & MCAID/[facility]  HIM ROI Authorization  12/05/15 UHC Medicare Denial  [place] E-Signature HIPAA Notice of Privacy Received 12/06/15   HIM ROI Authorization (Expired) 12/21/15 Authorization for batch [facility]/UnitedHealthCare   fbg   12/21/15  AMB Correspondence  01/03/16 RENEW[place] STANDING ORDER REQUEST [facility]  AMB Provider Completed Forms  02/16/16 AUTHORIZTION/ ORDERS [facility]  AMB Provider Completed Forms  06/28/16 AUTHO TO RENEW/CHANGE STANDING ORDERS [facility]  AMB Provider Completed Forms  07/27/16 [place] STANDING ORDER REQUEST [facility]  Insurance Card Received 09/18/16 [place] Card Received 09/18/16 Insurance Card /AP [place] Card Received 09/27/16 UHC MEDICARE/MEDICAID  Release of Information Received 09/27/16 [facility]  HIM ROI Authorization (Expired) 11/11/16 Authorization for batch 2ND [facility] FOR UHC  HIM ROI Authorization  11/28/16   AMB Provider Completed Forms  01/27/17 STANDING ORDER REQUEST [facility]  HIM ROI Authorization (Expired) 02/12/17 Authorization for batch CIO[facility]/UnitedHealthCare Medicare Risk Adjustment Review  fbg  02/12/17  [place] E-Signature HIPAA Notice of Privacy Signed 04/08/17   Insurance Card Not  Received (Deleted)    Insurance Card Not Received (Deleted)    AMB Correspondence (Deleted) 05/18/11 09/12 Order [facility]  AMB Correspondence (Deleted) 05/18/11 09/12 Order [facility]  Insurance Card Not Received (Deleted)    Insurance Card Not Received (Deleted)    AMB Provider Completed Forms (Deleted) 06/04/13 09/14 order [facility]  AMB HH/NH/Hospice (Deleted) 10/17/14 ORDERS [facility]  AMB Correspondence (Deleted) 06/02/15 REFERRAL SAMS MD, A.  AMB Correspondence (Deleted) 09/18/15   AMB Correspondence (Deleted) 06/06/16 PROGRESS NOTES VASCULAR & INTERVENTIONAL RADIOLOGY  Documents for the Encounter  AOB (Assignment of Insurance Benefits) Not Received    E-signature AOB Signed 04/28/17   MEDICARE RIGHTS Not Received    E-signature Medicare Rights Signed 04/28/17   ED Patient Billing Extract   ED PB Summary  Admission Information   Attending Provider Admitting Provider Admission Type Admission Date/Time  Tanner Dumas, MD Karmen Bongo, MD Emergency 04/28/17 1457  Discharge Date Hospital Service Auth/Cert Status Service Area   Internal Medicine Incomplete [place]  Unit Room/Bed Admission Status   AP-DEPT 300 A337/A337-01 Admission (Confirmed)   Admission   Complaint  SOB  Hospital Account   Name Acct ID Class Status Primary Coverage  [place], [place] [id number] [place]      Guarantor Account (for Hospital Account [id number])   Name Relation to Sarah Ann? Acct Type  [name] Self CHSA Yes Personal/Family  Address Phone    [address], [place] [address] [phone])        Coverage Information (for Hospital Account [id number])   1. [place] MEDICARE   F/O Payor/Plan Precert #  [facility] [place] #  [place], [place] [id number]  Address [place]  [address]  [address] [phone]  2. MEDICAID [place]/MEDICAID [place] ACCESS   F/O Payor/Plan Precert #  MEDICAID [place]/MEDICAID [place] ACCESS   Subscriber Subscriber #  Iris, Tatsch [id number]  Address Phone  [address] [place] [place], [place] [address] [phone]

## 2017-04-30 NOTE — Progress Notes (Signed)
[facility] PROGRESS NOTE  Tanner Pena NTI:[id number] DOB: Sep 10, 1940 DOA: 04/28/2017   PCP: Redmond School, MD     Assessment/Plan: Principal Problem:   Multifocal pneumonia Active Problems:   Chronic anticoagulation   Elevated INR   Anemia, iron deficiency   Controlled type 2 diabetes mellitus without complication (HCC)   Sepsis (HCC)   Mass of upper lobe of right lung   Thrombocytopenia ([place])    76 year old who has multiple medical problems including COPD recent diagnosis of lung mass hyper tension, obstructive sleep apnea, diabetes, previous aortic valve replacement on chronic anticoagulation, 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 He started coughing up blood about 5 days ago. Recently diagnosed with lung cancer,scheduled for PET/CT and scheduled for CT-guided biopsy for definitive tissue diagnosis. His chest x-ray on admission shows multilobar pneumonia/sepsis.   Assessment and plan Multifocal PNA with sepsis  While awaiting blood cultures, this appears to be a preseptic condition. -Sepsis protocol initiated  Postobstructive versus aspiration pneumonia -Other etiologies include aspiration versus URI (most likely viral). -This is his 3rd such admission since March. -CURB-65 score is 3+, meaning that the patient has a 14.5% risk of death. -Pneumonia Severity Index (PSI) is Class 4, 9% mortality. -Corticosteroids have been to shown to low overall mortality rate; risk of ARDS; and need for mechanical ventilation.  This is particularly true in severe PNA (class 3+ PSI).  Will add 40 mg prednisone daily for 3-7 days. -Discussed extensively with the patient's family, including help from palliative care . Patient's family is okay with stopping antibiotics tomorrow, and discharging to  hospice home Continue antibiotics through tomorrow SLP eval-probably needs  modified barium   Severe  cardiomyopathy Recent echo shows EF of 20-25% Hold ACE inhibitor/Altace Continue IV beta blocker Overall prognosis remains poor  Chronic anticoagulation with elevated INR -Afib, currently controlled, supratherapeutic INR on admission, now 3.16  Follow INR closely  Hypoglycemia Started patient on D10 NPO pending speech therapy evaluation  Lung Mass Likely not a candidate for biopsy, poor functional status, pulmonary following -Palliative care consult -Suspect that hypoalbuminemia is related to malignancy and is causing some anasarca - primarily of left hand and right LE    DM, currently hypoglycemic, discontinued Levemir -Will check A1c - it was 6.8 in 2/17 -hold Amaryl Continue sliding scale insulin  Thrombocytopenia Likely secondary to sepsis Follow-up   Hypokalemia Replete  DVT prophylaxsis Coumadin  Code Status:  DNR    Code Status Orders        Start     Ordered    Family Communication: Discussed in detail with the patient's daughter about him  declining and most likely Will be needing hospice in the next few days,not a candidate for invasive procedures, given multiple comorbidities, high aspiration risk , all imaging results, lab results explained to the patient   Disposition Plan: Likely will need prolonged hospitalization overall poor prognosis     Consultants:  Pulmonary  Procedures:  None  Antibiotics: Anti-infectives    Start     Dose/Rate Route Frequency Ordered Stop   04/29/17 1000  piperacillin-tazobactam (ZOSYN) IVPB 3.375 g     3.375 g 12.5 mL/hr over 240 Minutes Intravenous Every 8 hours 04/29/17 0900     04/29/17 0200  vancomycin (VANCOCIN) IVPB 1000 mg/200 mL premix     1,000 mg 200 mL/hr over 60 Minutes Intravenous Every 8 hours 04/28/17 2100     04/28/17 2200  ceFEPIme (MAXIPIME) 2 g in dextrose 5 % 50 mL IVPB  Status:  Discontinued     2 g 100 mL/hr over 30 Minutes Intravenous Every 12 hours 04/28/17 2100 04/29/17 0900    04/28/17 1630  piperacillin-tazobactam (ZOSYN) IVPB 3.375 g     3.375 g 100 mL/hr over 30 Minutes Intravenous  Once 04/28/17 1627 04/28/17 1850   04/28/17 1630  vancomycin (VANCOCIN) IVPB 1000 mg/200 mL premix     1,000 mg 200 mL/hr over 60 Minutes Intravenous  Once 04/28/17 1627 04/28/17 1957         HPI/Subjective: Sitting up in the chair, more awake and alert  Objective: Vitals:   04/30/17 0216 04/30/17 0457 04/30/17 0720 04/30/17 1130  BP:  (!) 95/44  (!) 115/53  Pulse:  90  91  Resp:  (!) 21    Temp:  97.6 F (36.4 C)    TempSrc:      SpO2: 95% 100% 98% 94%  Weight:      Height:        Intake/Output Summary (Last 24 hours) at 04/30/17 1337 Last data filed at 04/30/17 0900  Gross per 24 hour  Intake             1269 ml  Output              150 ml  Net             1119 ml    Exam:  Examination:  General exam: Chronically ill, appears to be a huge aspiration risk Respiratory system: Clear to auscultation. Respiratory effort normal. Cardiovascular system: S1 & S2 heard, RRR. No JVD, murmurs, rubs, gallops or clicks. No pedal edema. Gastrointestinal system: Abdomen is nondistended, soft and nontender. No organomegaly or masses felt. Normal bowel sounds heard. Central nervous system: Barely arousable to sternal rub Extremities:  Strength not tested Skin: No rashes, lesions or ulcers Psychiatry: Judgement impaired    Data Reviewed: I have personally reviewed following labs and imaging studies  Micro Results Recent Results (from the past 240 hour(s))  Culture, blood (x 2)     Status: None (Preliminary result)   Collection Time: 04/28/17 10:43 PM  Result Value Ref Range Status   Specimen Description BLOOD RIGHT ARM  Final   Special Requests   Final    BOTTLES DRAWN AEROBIC AND ANAEROBIC Blood Culture adequate volume   Culture NO GROWTH 2 DAYS  Final   Report Status PENDING  Incomplete  Culture, blood (x 2)     Status: None (Preliminary result)   Collection  Time: 04/28/17 10:45 PM  Result Value Ref Range Status   Specimen Description BLOOD RIGHT HAND  Final   Special Requests   Final    BOTTLES DRAWN AEROBIC AND ANAEROBIC Blood Culture adequate volume   Culture NO GROWTH 2 DAYS  Final   Report Status PENDING  Incomplete  Culture, sputum-assessment     Status: None   Collection Time: 04/29/17  5:00 AM  Result Value Ref Range Status   Specimen Description SPUTUM  Final   Special Requests Normal  Final   Sputum evaluation THIS SPECIMEN IS ACCEPTABLE FOR SPUTUM CULTURE  Final   Report Status 04/29/2017 FINAL  Final  Culture, respiratory (NON-Expectorated)     Status: None (Preliminary result)   Collection Time: 04/29/17  5:00 AM  Result Value Ref Range Status   Specimen Description SPUTUM  Final   Special Requests Normal Reflexed from [id number]  Final   Gram  Stain   Final    FEW WBC PRESENT, PREDOMINANTLY PMN RARE GRAM POSITIVE COCCI IN PAIRS RARE YEAST RARE GRAM NEGATIVE RODS    Culture   Final    CULTURE REINCUBATED FOR BETTER GROWTH Performed at [facility], [place] [address], [place], [place] [address]    Report Status PENDING  Incomplete    Radiology Reports Dg Chest 2 View  Result Date: 04/08/2017 CLINICAL DATA:  Productive cough and congestion EXAM: CHEST  2 VIEW COMPARISON:  03/10/2017 FINDINGS: Cardiac shadow is mildly enlarged. Postsurgical changes are again seen. Fullness is noted in the right hilar region although stable from prior exams and consistent with a prominent pulmonary artery and patient rotation to the right. No focal infiltrate is noted. Chronic changes in the left base are seen. No new focal abnormality is noted. IMPRESSION: Chronic changes in the left base although significantly improved from previous exams. Fullness in the right hilar region consistent with prominent pulmonary artery Electronically Signed   By: Inez Catalina M.D.   On: 04/08/2017 16:14   Ct Head W Wo Contrast  Result Date: 04/23/2017 CLINICAL  DATA:  New right upper lobe lung mass. New onset headaches. Daily persistent headaches. 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 EXAM: CT HEAD WITHOUT AND WITH CONTRAST TECHNIQUE: Contiguous axial images were obtained from the base of the skull through the vertex without and with intravenous contrast CONTRAST:  59mL ISOVUE-300 IOPAMIDOL (ISOVUE-300) INJECTION 61% COMPARISON:  None. FINDINGS: Brain: Mild atrophy and moderate white matter changes are noted bilaterally. A lacunar infarct involving the left caudate head appears remote. The basal ganglia are otherwise intact. The brainstem and cerebellum are normal. No focal mass lesion or enhancement is present. There is no acute hemorrhage. Vascular: Dense vascular calcifications present at the cavernous internal carotid artery is bilaterally. 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 Dense calcifications are present at the dural margin of the vertebral arteries bilaterally. There is no hyperdense vessel. Skull: The calvarium is intact. No focal lytic or blastic lesions are present. Sinuses/Orbits: The paranasal sinuses and mastoid air cells are clear. The patient is status post bilateral lens replacements. The globes and orbits are within normal limits. IMPRESSION: 1. No evidence for metastatic disease to the brain. 2. 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 Of note, dense calcifications are present in the more proximal left cavernous internal carotid artery as well. 3. Extensive atherosclerotic change. These results were called by telephone at the time of interpretation on 04/23/2017 at 10:00 am to Dr. Twana First , who verbally acknowledged these results. Electronically Signed   By: San Morelle M.D.   On: 04/23/2017 10:06   Ct Angio Chest Pe W And/or Wo Contrast  Result Date: 04/08/2017 CLINICAL DATA:  Shortness of breath.  Cough. EXAM: CT ANGIOGRAPHY CHEST WITH CONTRAST  TECHNIQUE: Multidetector CT imaging of the chest was performed using the standard protocol during bolus administration of intravenous contrast. Multiplanar CT image reconstructions and MIPs were obtained to evaluate the vascular anatomy. CONTRAST:  100 mL Isovue 370 nonionic COMPARISON:  Chest radiograph April 08, 2017 FINDINGS: Cardiovascular: There is no evident pulmonary embolus. Note that there is narrowing of the right upper lobe pulmonary artery branches due to tumor encasement. There is no appreciable thoracic adenopathy or dissection. There is extensive atherosclerotic calcification in the proximal great vessels, most severe in the left subclavian artery were there  is hemodynamically significant obstruction approximately 2 cm distal to the origin of this vessel. There is extensive atherosclerotic calcification throughout the aorta. There is extensive native coronary artery calcification. Patient is status post coronary artery bypass grafting. Pericardium is not appreciably thickened. Mediastinum/Nodes: Thyroid appears unremarkable. There is adenopathy at multiple sites in the chest. In the anterior mediastinum, there are several enlarged lymph nodes, largest measuring 2.7 x 2.3 cm. Anterior to the distal trachea, there is a lymph node measuring 2.8 x 2.6 cm. 1.8 x 1.8 cm. To the left of the carina, there is adenopathy measuring 2.4 x 2.5 cm. In the right hilum, there is adenopathy in measuring 2.9 x 2.5 cm. In the sub- carinal region, there are several enlarged lymph nodes. Largest lymph node measures 2.6 x 2.2 cm. A lymph node posterior to the carina more inferiorly on the right measures 2.9 x 1.7 cm. No esophageal lesions are evident. Lungs/Pleura: There is calcification along the posterior parietal pleural on the left consistent with previous asbestos exposure. There is a mass which surrounds right pulmonary artery branches which is felt to arise from the medial aspect of the posterior segment right  upper lobe and extends into the hilum. This lesion measures 4.6 x 4.6 cm. In the apex on the right, there is an irregular mass with a somewhat bubbly central appearance measuring 1.7 x 1.0 cm. This appearance is concerning for neoplastic etiology. There is a nearby 6 mm nodular opacity in the right apex seen on axial slice 16 series 6. On axial slice 29 series 6, there is a 3 mm nodular opacity in the posterior segment of the right upper lobe. There is atelectatic change in both bases, more on the left than on the right. No pleural effusion evident. Upper Abdomen: In the visualized upper abdominal region, there is cholelithiasis. Gallbladder wall does not appear appreciably thickened. There is atherosclerotic calcification right renal artery as well as in the origins of the superior mesenteric and celiac arteries. There is a small mass in the left adrenal measuring 1.1 x 0.7 cm. This small mass has an attenuation value of 34 Hounsfield units. A small metastasis in the left adrenal cannot be excluded. The right adrenal appears unremarkable. Liver is mildly lobular in contour, a finding that potentially could indicate a degree of underlying hepatic cirrhosis. Musculoskeletal: There is degenerative change in the thoracic spine. There are no evident blastic or lytic bone lesions. Patient is status post median sternotomy. Review of the MIP images confirms the above findings. IMPRESSION: 1.  No evident pulmonary embolus. 2. Right upper lobe mass surrounding and encasing upper lobe pulmonary artery is. This mass extends into the right superior hilar region. This mass is consistent with neoplasm. 3. Nodular lesion with somewhat bubbly central appearance in the right apex measuring 1.7 x 1.0 cm. Suspect second focus of parenchymal lung neoplasm. 4.  Extensive multifocal adenopathy. 5. 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 6. Extensive aortic atherosclerosis. Patient is status post coronary  artery bypass grafting. Hemodynamically significant obstruction in the proximal left subclavian artery, celiac artery, superior mesenteric artery, and right renal artery. Other areas of atherosclerotic calcification noted. 7.  Cholelithiasis. 8. Contour of the liver raises concern for a degree of underlying hepatic cirrhosis. Aortic Atherosclerosis (ICD10-I70.0). Electronically Signed   By: Lowella Grip III M.D.   On: 04/08/2017 19:40   Dg Chest Port 1 View  Result Date: 04/28/2017 CLINICAL DATA:  Cough and hemoptysis EXAM: PORTABLE CHEST 1  VIEW COMPARISON:  Chest radiograph and chest CT April 08, 2017 FINDINGS: There is airspace consolidation in the right mid lung and left base regions. There are small pleural effusions bilaterally. There is cardiomegaly with mild pulmonary venous hypertension. There is aortic atherosclerosis. 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 No evident adenopathy. There is evidence of old trauma involving the lateral right clavicle. IMPRESSION: Airspace consolidation, likely multifocal pneumonia, in the right mid lung and left base regions. There is evidence of pulmonary vascular congestion with small left pleural effusion. There is aortic atherosclerosis. 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 Followup PA and lateral chest radiographs recommended in 3-4 weeks following trial of antibiotic therapy to ensure resolution and exclude underlying malignancy. Aortic Atherosclerosis (ICD10-I70.0). Electronically Signed   By: Lowella Grip III M.D.   On: 04/28/2017 15:48     CBC  Recent Labs Lab 04/28/17 1556 04/29/17 0418  WBC 14.0* 13.6*  HGB 12.0* 11.9*  HCT 37.3* 36.8*  PLT 114* 109*  MCV 89.0 89.3  MCH 28.6 28.9  MCHC 32.2 32.3  RDW 15.0 15.4  LYMPHSABS 1.0 1.4  MONOABS 1.1* 1.2*  EOSABS 0.0 0.1  BASOSABS 0.0 0.0    Chemistries   Recent Labs Lab 04/28/17 1556 04/29/17 0418 04/30/17 0459   NA 138 138 134*  K 3.7 3.5 3.2*  CL 97* 98* 92*  CO2 33* 34* 32  GLUCOSE 67 53* 166*  BUN 26* 22* 22*  CREATININE 0.70 0.71 1.23  CALCIUM 8.8* 8.5* 8.5*  AST 28  --   --   ALT 17  --   --   ALKPHOS 26*  --   --   BILITOT 1.4*  --   --    ------------------------------------------------------------------------------------------------------------------ estimated creatinine clearance is 57.4 mL/min (by C-G formula based on SCr of 1.23 mg/dL). ------------------------------------------------------------------------------------------------------------------  Recent Labs  04/28/17 1556  HGBA1C 6.5*   ------------------------------------------------------------------------------------------------------------------ No results for input(s): CHOL, HDL, LDLCALC, TRIG, CHOLHDL, LDLDIRECT in the last 72 hours. ------------------------------------------------------------------------------------------------------------------ No results for input(s): TSH, T4TOTAL, T3FREE, THYROIDAB in the last 72 hours.  Invalid input(s): FREET3 ------------------------------------------------------------------------------------------------------------------ No results for input(s): VITAMINB12, FOLATE, FERRITIN, TIBC, IRON, RETICCTPCT in the last 72 hours.  Coagulation profile  Recent Labs Lab 04/28/17 1556 04/29/17 0418 04/30/17 0459  INR 4.35* 3.92 3.16    No results for input(s): DDIMER in the last 72 hours.  Cardiac Enzymes No results for input(s): CKMB, TROPONINI, MYOGLOBIN in the last 168 hours.  Invalid input(s): CK ------------------------------------------------------------------------------------------------------------------ Invalid input(s): POCBNP   CBG:  Recent Labs Lab 04/29/17 2113 04/30/17 0009 04/30/17 0456 04/30/17 0738 04/30/17 1151  GLUCAP 108* 91 146* 179* 152*       Studies: Dg Chest Port 1 View  Result Date: 04/28/2017 CLINICAL DATA:  Cough and hemoptysis  EXAM: PORTABLE CHEST 1 VIEW COMPARISON:  Chest radiograph and chest CT April 08, 2017 FINDINGS: There is airspace consolidation in the right mid lung and left base regions. There are small pleural effusions bilaterally. There is cardiomegaly with mild pulmonary venous hypertension. There is aortic atherosclerosis. 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 No evident adenopathy. There is evidence of old trauma involving the lateral right clavicle. IMPRESSION: Airspace consolidation, likely multifocal pneumonia, in the right mid lung and left base regions. There is evidence of pulmonary vascular congestion with small left pleural effusion. There is aortic atherosclerosis. 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 Followup PA and lateral chest radiographs  recommended in 3-4 weeks following trial of antibiotic therapy to ensure resolution and exclude underlying malignancy. Aortic Atherosclerosis (ICD10-I70.0). Electronically Signed   By: Lowella Grip III M.D.   On: 04/28/2017 15:48      Lab Results  Component Value Date   HGBA1C 6.5 (H) 04/28/2017   HGBA1C 6.8 (H) 09/30/2015   HGBA1C 7.0 (H) 08/07/2015   Lab Results  Component Value Date   LDLCALC 62 05/09/2014   CREATININE 1.23 04/30/2017       Scheduled Meds: . insulin aspart  0-15 Units Subcutaneous TID WC  . insulin aspart  0-5 Units Subcutaneous QHS  . ipratropium-albuterol  3 mL Nebulization Q6H  . isosorbide mononitrate  60 mg Oral Daily  . mouth rinse  15 mL Mouth Rinse BID  . metoprolol tartrate  5 mg Intravenous Q6H  . pantoprazole (PROTONIX) IV  40 mg Intravenous Q24H   Continuous Infusions: . dextrose 5 % and 0.9 % NaCl with KCl 40 mEq/L 50 mL/hr at 04/30/17 0815  . piperacillin-tazobactam (ZOSYN)  IV Stopped (04/30/17 1244)  . vancomycin Stopped (04/30/17 0945)     LOS: 2 days    Time spent: >30 MINS    Reyne Dumas  [facility] Pager [phone].  If 7PM-7AM, please contact night-coverage at [email or web address], password [facility] 04/30/2017, 1:37 PM  LOS: 2 days

## 2017-04-30 NOTE — Progress Notes (Signed)
Patient sitting up in bed really uncomfortable per daughter. Patient c/o left leg phantom pain rated 7/10 on verbal pain scale. Patient told this RN that Tylenol suppository given earlier helped, but daughter stated that he told her that it did not help. Dr Shanon Brow notified. Oxycodone 5 mg and Percocet 5/325 given as already ordered. Dr. Shanon Brow aware that patient is NPO d/t failed swallow eval. OK to give x1 dose per Dr. Shanon Brow. Med crushed in applesauce. Patient tolerated well. No coughing or clearing throat within 10 minutes after swallowing meds and applesauce. Patient encouraged to let staff know if he is uncomfortable. Daughter at bedside. Denies needs at this time.

## 2017-04-30 NOTE — Progress Notes (Signed)
Speech Language Pathology Treatment: Dysphagia  Patient Details Name: Tanner Pena MRN: [id number] DOB: 04/07/1941 Today's Date: 04/30/2017 Time: 1400-1436 SLP Time Calculation (min) (ACUTE ONLY): 36 min  Assessment / Plan / Recommendation Clinical Impression  Pt seen at bedside with family present for education regarding comfort feeds given Pt going home with hospice. Upon SLP arrival, Pt visibly short of breath (breathing orally despite nasal cannula) and with audible, wet secretions. Oral suctioning yielded only trace secretions. Pt able to express wants/needs and help direct his care (related to bed positioning and desire for water). Volitional and spontaneous cough is weak and congested.   Pt presented with ice chips, water, sherbet, and puree (very minimal intake). Pt shows signs of decreased airway protection with all trials, however he did express a desire for water and sherbet. SLP provided education with Pt and family regarding comfort feeds and "following Pt's lead" in terms of offering po. Pt appears to have increased difficulty managing heavier bolus (puree) characterized by increased wet coughing. Pt appears near end of life.   Family inquired about deep suctioning and SLP encouraged family to discuss with MD/RN. SLP explained this may cause both temporary discomfort (gagging and pressure) and relief (clearing tracheal and pharyngeal secretions). Pt immediately stated that he did not want to be "gagged". SLP further advised to continue with oral care and provided family with oral swabs and mouthwash. Recommend comfort feeds of thin liquids (pt mostly requesting water and ice chips) and ice cream with the understanding that Pt is at high risk for aspiration. Family plans to take Pt home with hospice, however they also appear conflicted about taking him home with the impending storm. SLP will ask case management whether GIP is a possibility. SLP will sign off. Re-consult as needed.     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 He started coughing up blood about 5 days ago. He has been very weak. He has decreased strength. He's not eating very well. He's had a lot of swelling. He's not had any fever at home. He has been using oxygen. He has lost a substantial amount of weight. He was hospitalized in July with community-acquired pneumonia. He then came back to the hospital in August and was discovered to have lung mass by CT. He is scheduled for PET/CT and scheduled for CT-guided biopsy for definitive tissue diagnosis. His chest x-ray on admission shows multilobar pneumonia. He is coagulopathic with supratherapeutic INR. He was hypotensive on admission. He is still coughing up sputum. This morning he has low blood sugar so history may not be reliable. He says he is still short of breath. He is still coughing up some sputum. His sputum is definitely bloody.      SLP Plan  Discharge SLP treatment due to (comment) (going to hospice)       Recommendations  Diet recommendations: Other(comment) (comfort feeds- ice chips, thin liquid, ice cream) Liquids provided via: Cup;Teaspoon;No straw Medication Administration: Via alternative means Supervision: Full supervision/cueing for compensatory strategies Compensations: Slow rate;Small sips/bites Postural Changes and/or Swallow Maneuvers: Seated upright 90 degrees;Upright 30-60 min after meal                Oral Care Recommendations: Oral care QID;Staff/trained caregiver to provide oral care Follow up Recommendations:  (  discharging to hospice) Plan: Discharge SLP treatment due to (comment) (going to hospice)       Thank you,  Genene Churn,  [place]                 [place] 04/30/2017, 2:42 PM

## 2017-04-30 NOTE — Evaluation (Signed)
Physical Therapy Evaluation Patient Details Name: Tanner Pena MRN: [id number] DOB: 1940-10-21 Today's Date: 04/30/2017   History of Present Illness  Tanner Pena is a 76 y.o. 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  He was recently diagnosed with a lung mass.  Since last Thursday, he has been coughing up blood.  Ever since last hospitalization, he has been very weak.  Decreased strength, decreased PO.  His right foot with significant swelling - he lets it hang off the bed at night.  His hand is also swelling despite Lasix.  No medications so far today. +cough, productive of foamy white sputum and blood.  No known fevers.  He has been wearing home O2 since last hospitalization, on 2L home O2.  40 pound weight loss in the last 2 months.      Clinical Impression  Patient required Mod assist for donning/doffing LLE BKA prosthetic leg while sitting at bedside, required much time to complete functional tasks due to SOB and coughing up phlegm.  Patient limited for functional mobility as stated below secondary to BLE weakness, fatigue and poor standing balance.  Patient will benefit from continued physical therapy in hospital and recommended venue below to increase strength, balance, endurance for safe ADLs and gait.    Follow Up Recommendations Home health PT;Supervision/Assistance - 24 hour    Equipment Recommendations       Recommendations for Other Services       Precautions / Restrictions Precautions Precautions: Fall Precaution Comments: Home O2 dependent Restrictions Weight Bearing Restrictions: No      Mobility  Bed Mobility Overal bed mobility: Needs Assistance Bed Mobility: Supine to Sit;Sit to Supine     Supine to sit: Mod assist Sit to supine: Mod assist      Transfers Overall transfer level: Needs  assistance Equipment used: Rolling walker (2 wheeled) Transfers: Sit to/from Stand Sit to Stand: Mod assist;Max assist         General transfer comment: difficulty with sit to stand secondary to BLE weakness  Ambulation/Gait Ambulation/Gait assistance: Mod assist;Max assist Ambulation Distance (Feet): 5 Feet Assistive device: Rolling walker (2 wheeled) Gait Pattern/deviations: Decreased stride length;Decreased stance time - right;Decreased stance time - left;Decreased step length - right;Decreased step length - left   Gait velocity interpretation: Below normal speed for age/gender General Gait Details: limited to a few steps and marching in place at bedside due to [employer]  Stairs            Wheelchair Mobility    Modified Rankin (Stroke Patients Only)       Balance Overall balance assessment: Needs assistance Sitting-balance support: Feet supported Sitting balance-Leahy Scale: Fair     Standing balance support: Bilateral upper extremity supported;During functional activity Standing balance-Leahy Scale: Poor                               Pertinent Vitals/Pain Pain Assessment: 0-10 Pain Score: 4  Pain Location: from head to toes, "per patient" Pain Descriptors / Indicators: Aching (generalized) Pain Intervention(s): Limited activity within patient's tolerance;Monitored during session    [place] expects to be discharged to:: Private residence Living Arrangements: Children Available Help at Discharge: Family Type of Home: House Home Access: Level entry;Ramped entrance     Home Layout: One  level Home Equipment: Clinical cytogeneticist - 2 wheels;Walker - 4 wheels;Cane - single point;Wheelchair - Education officer, community - power      Prior Function Level of Independence: Independent with assistive device(s)         Comments: For last 6 weeks has been limited for walking     Hand Dominance        Extremity/Trunk Assessment    Upper Extremity Assessment Upper Extremity Assessment: Generalized weakness    Lower Extremity Assessment Lower Extremity Assessment: Generalized weakness    Cervical / Trunk Assessment Cervical / Trunk Assessment: Normal  Communication   Communication: No difficulties;HOH  Cognition Arousal/Alertness: Awake/alert Behavior During Therapy: WFL for tasks assessed/performed Overall Cognitive Status: Within Functional Limits for tasks assessed                                        General Comments      Exercises     Assessment/Plan    PT Assessment Patient needs continued PT services  PT Problem List Decreased strength;Decreased activity tolerance;Decreased balance;Decreased mobility       PT Treatment Interventions Gait training;Stair training;Functional mobility training;Therapeutic activities;Therapeutic exercise;Patient/family education    PT Goals (Current goals can be found in the Care Plan section)  Acute Rehab PT Goals PT Goal Formulation: With patient/family Time For Goal Achievement: 05/07/17 Potential to Achieve Goals: Good    Frequency Min 3X/week   Barriers to discharge        Co-evaluation               AM-PAC PT "6 Clicks" Daily Activity  Outcome Measure Difficulty turning over in bed (including adjusting bedclothes, sheets and blankets)?: Unable Difficulty moving from lying on back to sitting on the side of the bed? : Unable Difficulty sitting down on and standing up from a chair with arms (e.g., wheelchair, bedside commode, etc,.)?: Unable Help needed moving to and from a bed to chair (including a wheelchair)?: A Lot Help needed walking in hospital room?: A Lot Help needed climbing 3-5 steps with a railing? : Total 6 Click Score: 8    End of Session Equipment Utilized During Treatment: Oxygen Activity Tolerance: Patient limited by fatigue Patient left: in chair;with call bell/phone within reach;with family/visitor  present Nurse Communication: Mobility status PT Visit Diagnosis: Unsteadiness on feet (R26.81);Other abnormalities of gait and mobility (R26.89);Muscle weakness (generalized) (M62.81)    Time: 2947-6546 PT Time Calculation (min) (ACUTE ONLY): 35 min   Charges:   PT Evaluation $PT Eval Moderate Complexity: 1 Mod PT Treatments $Therapeutic Activity: 23-37 mins   PT G Codes:          10:46 AM, 2017-05-17 Lonell Grandchild, MPT Physical Therapist with [facility] [phone] office [phone] mobile phone
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
## 2017-04-30 NOTE — Progress Notes (Signed)
Subjective: Events of last night noted. I discussed the situation with the patient and with his daughter at bedside. I told him how seriously sick he is and that I think he's no longer a candidate for any sort of biopsy. I had seen him in my office about a week ago with what appeared to be bronchitis/pneumonia and that was treated and he finished antibiotics and then about 3 days later started having problems again. He is clearly aspirating. He is still coughing up blood.  Objective: Vital signs in last 24 hours: Temp:  [97.6 F (36.4 C)-98.6 F (37 C)] 97.6 F (36.4 C) (09/12 0457) Pulse Rate:  [82-97] 90 (09/12 0457) Resp:  [16-21] 21 (09/12 0457) BP: (93-133)/(36-59) 95/44 (09/12 0457) SpO2:  [94 %-100 %] 98 % (09/12 0720) Weight change:  Last BM Date: 04/27/17  Intake/Output from previous day: 09/11 0701 - 09/12 0700 In: 1269 [I.V.:769; IV Piggyback:500] Out: 150 [Urine:150]  PHYSICAL EXAM General appearance: alert and moderate distress Resp: He has bubbling respirations that I can hear from the door. 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 He is very hoarse.  Lab Results:  Results for orders placed or performed during the hospital encounter of 04/28/17 (from the past 48 hour(s))  POC occult blood, ED     Status: Abnormal   Collection Time: 04/28/17  3:48 PM  Result Value Ref Range   Fecal Occult Bld POSITIVE (A) NEGATIVE  CBC with Differential/Platelet     Status: Abnormal   Collection Time: 04/28/17  3:56 PM  Result Value Ref Range   WBC 14.0 (H) 4.0 - 10.5 K/uL   RBC 4.19 (L) 4.22 - 5.81 MIL/uL   Hemoglobin 12.0 (L) 13.0 - 17.0 g/dL   HCT 37.3 (L) 39.0 - 52.0 %   MCV 89.0 78.0 - 100.0 fL   MCH 28.6 26.0 - 34.0 pg   MCHC 32.2 30.0 - 36.0 g/dL   RDW  15.0 11.5 - 15.5 %   Platelets 114 (L) 150 - 400 K/uL    Comment: SPECIMEN CHECKED FOR CLOTS PLATELET COUNT CONFIRMED BY SMEAR    Neutrophils Relative % 85 %   Neutro Abs 11.8 (H) 1.7 - 7.7 K/uL   Lymphocytes Relative 7 %   Lymphs Abs 1.0 0.7 - 4.0 K/uL   Monocytes Relative 8 %   Monocytes Absolute 1.1 (H) 0.1 - 1.0 K/uL   Eosinophils Relative 0 %   Eosinophils Absolute 0.0 0.0 - 0.7 K/uL   Basophils Relative 0 %   Basophils Absolute 0.0 0.0 - 0.1 K/uL  Comprehensive metabolic panel     Status: Abnormal   Collection Time: 04/28/17  3:56 PM  Result Value Ref Range   Sodium 138 135 - 145 mmol/L   Potassium 3.7 3.5 - 5.1 mmol/L   Chloride 97 (L) 101 - 111 mmol/L   CO2 33 (H) 22 - 32 mmol/L   Glucose, Bld 67 65 - 99 mg/dL   BUN 26 (H) 6 - 20 mg/dL   Creatinine, Ser 0.70 0.61 - 1.24 mg/dL   Calcium 8.8 (L) 8.9 - 10.3 mg/dL   Total Protein 5.6 (L) 6.5 - 8.1 g/dL   Albumin 2.7 (L) 3.5 - 5.0 g/dL   AST 28 15 - 41 U/L   ALT 17  17 - 63 U/L   Alkaline Phosphatase 26 (L) 38 - 126 U/L   Total Bilirubin 1.4 (H) 0.3 - 1.2 mg/dL   GFR calc non Af Amer >60 >60 mL/min   GFR calc Af Amer >60 >60 mL/min    Comment: (NOTE) The eGFR has been calculated using the CKD EPI equation. This calculation has not been validated in all clinical situations. eGFR's persistently <60 mL/min signify possible Chronic Kidney Disease.    Anion gap 8 5 - 15  Protime-INR     Status: Abnormal   Collection Time: 04/28/17  3:56 PM  Result Value Ref Range   Prothrombin Time 41.4 (H) 11.4 - 15.2 seconds   INR 4.35 (HH)     Comment: REPEATED TO VERIFY CRITICAL RESULT CALLED TO, READ BACK BY AND VERIFIED WITH: WALLACE,L @ 8676 ON 9.10.18 BY BOWMAN,L   Procalcitonin     Status: None   Collection Time: 04/28/17  3:56 PM  Result Value Ref Range   Procalcitonin <0.10 ng/mL    Comment:        Interpretation: PCT (Procalcitonin) <= 0.5 ng/mL: Systemic infection (sepsis) is not likely. Local bacterial infection is  possible. (NOTE)         ICU PCT Algorithm               Non ICU PCT Algorithm    ----------------------------     ------------------------------         PCT < 0.25 ng/mL                 PCT < 0.1 ng/mL     Stopping of antibiotics            Stopping of antibiotics       strongly encouraged.               strongly encouraged.    ----------------------------     ------------------------------       PCT level decrease by               PCT < 0.25 ng/mL       >= 80% from peak PCT       OR PCT 0.25 - 0.5 ng/mL          Stopping of antibiotics                                             encouraged.     Stopping of antibiotics           encouraged.    ----------------------------     ------------------------------       PCT level decrease by              PCT >= 0.25 ng/mL       < 80% from peak PCT        AND PCT >= 0.5 ng/mL            Continuin g antibiotics                                              encouraged.       Continuing antibiotics            encouraged.    ----------------------------     ------------------------------  PCT level increase compared          PCT > 0.5 ng/mL         with peak PCT AND          PCT >= 0.5 ng/mL             Escalation of antibiotics                                          strongly encouraged.      Escalation of antibiotics        strongly encouraged.   Hemoglobin A1c     Status: Abnormal   Collection Time: 04/28/17  3:56 PM  Result Value Ref Range   Hgb A1c MFr Bld 6.5 (H) 4.8 - 5.6 %    Comment: (NOTE) Pre diabetes:          5.7%-6.4% Diabetes:              >6.4% Glycemic control for   <7.0% adults with diabetes    Mean Plasma Glucose 139.85 mg/dL    Comment: Performed at [facility] [address] [place], [place] [address]  I-Stat CG4 Lactic Acid, ED     Status: None   Collection Time: 04/28/17  4:10 PM  Result Value Ref Range   Lactic Acid, Venous 1.15 0.5 - 1.9 mmol/L  Glucose, capillary     Status: Abnormal    Collection Time: 04/28/17  9:45 PM  Result Value Ref Range   Glucose-Capillary 115 (H) 65 - 99 mg/dL  Strep pneumoniae urinary antigen     Status: None   Collection Time: 04/28/17 10:30 PM  Result Value Ref Range   Strep Pneumo Urinary Antigen NEGATIVE NEGATIVE    Comment:        Infection due to S. pneumoniae cannot be absolutely ruled out since the antigen present may be below the detection limit of the test.   Culture, blood (x 2)     Status: None (Preliminary result)   Collection Time: 04/28/17 10:43 PM  Result Value Ref Range   Specimen Description BLOOD RIGHT ARM    Special Requests      BOTTLES DRAWN AEROBIC AND ANAEROBIC Blood Culture adequate volume   Culture NO GROWTH < 12 HOURS    Report Status PENDING   Culture, blood (x 2)     Status: None (Preliminary result)   Collection Time: 04/28/17 10:45 PM  Result Value Ref Range   Specimen Description BLOOD RIGHT HAND    Special Requests      BOTTLES DRAWN AEROBIC AND ANAEROBIC Blood Culture adequate volume   Culture NO GROWTH < 12 HOURS    Report Status PENDING   Protime-INR     Status: Abnormal   Collection Time: 04/29/17  4:18 AM  Result Value Ref Range   Prothrombin Time 38.1 (H) 11.4 - 15.2 seconds   INR 0.10   Basic metabolic panel     Status: Abnormal   Collection Time: 04/29/17  4:18 AM  Result Value Ref Range   Sodium 138 135 - 145 mmol/L   Potassium 3.5 3.5 - 5.1 mmol/L   Chloride 98 (L) 101 - 111 mmol/L   CO2 34 (H) 22 - 32 mmol/L   Glucose, Bld 53 (L) 65 - 99 mg/dL   BUN 22 (H) 6 - 20 mg/dL   Creatinine, Ser 0.71  0.61 - 1.24 mg/dL   Calcium 8.5 (L) 8.9 - 10.3 mg/dL   GFR calc non Af Amer >60 >60 mL/min   GFR calc Af Amer >60 >60 mL/min    Comment: (NOTE) The eGFR has been calculated using the CKD EPI equation. This calculation has not been validated in all clinical situations. eGFR's persistently <60 mL/min signify possible Chronic Kidney Disease.    Anion gap 6 5 - 15  CBC WITH DIFFERENTIAL      Status: Abnormal   Collection Time: 04/29/17  4:18 AM  Result Value Ref Range   WBC 13.6 (H) 4.0 - 10.5 K/uL   RBC 4.12 (L) 4.22 - 5.81 MIL/uL   Hemoglobin 11.9 (L) 13.0 - 17.0 g/dL   HCT 36.8 (L) 39.0 - 52.0 %   MCV 89.3 78.0 - 100.0 fL   MCH 28.9 26.0 - 34.0 pg   MCHC 32.3 30.0 - 36.0 g/dL   RDW 15.4 11.5 - 15.5 %   Platelets 109 (L) 150 - 400 K/uL    Comment: CONSISTENT WITH PREVIOUS RESULT   Neutrophils Relative % 81 %   Neutro Abs 11.0 (H) 1.7 - 7.7 K/uL   Lymphocytes Relative 10 %   Lymphs Abs 1.4 0.7 - 4.0 K/uL   Monocytes Relative 9 %   Monocytes Absolute 1.2 (H) 0.1 - 1.0 K/uL   Eosinophils Relative 1 %   Eosinophils Absolute 0.1 0.0 - 0.7 K/uL   Basophils Relative 0 %   Basophils Absolute 0.0 0.0 - 0.1 K/uL  Culture, sputum-assessment     Status: None   Collection Time: 04/29/17  5:00 AM  Result Value Ref Range   Specimen Description SPUTUM    Special Requests Normal    Sputum evaluation THIS SPECIMEN IS ACCEPTABLE FOR SPUTUM CULTURE    Report Status 04/29/2017 FINAL   Culture, respiratory (NON-Expectorated)     Status: None (Preliminary result)   Collection Time: 04/29/17  5:00 AM  Result Value Ref Range   Specimen Description SPUTUM    Special Requests Normal Reflexed from [id number]    Gram Stain      FEW WBC PRESENT, PREDOMINANTLY PMN RARE GRAM POSITIVE COCCI IN PAIRS RARE YEAST RARE GRAM NEGATIVE RODS Performed at [facility], [address] [place], [place] [address]    Culture PENDING    Report Status PENDING   Glucose, capillary     Status: Abnormal   Collection Time: 04/29/17  8:01 AM  Result Value Ref Range   Glucose-Capillary 35 (LL) 65 - 99 mg/dL   Comment 1 Notify RN   Glucose, capillary     Status: Abnormal   Collection Time: 04/29/17  8:28 AM  Result Value Ref Range   Glucose-Capillary 107 (H) 65 - 99 mg/dL  Glucose, capillary     Status: Abnormal   Collection Time: 04/29/17  9:28 AM  Result Value Ref Range   Glucose-Capillary 55  (L) 65 - 99 mg/dL  Glucose, capillary     Status: Abnormal   Collection Time: 04/29/17 10:00 AM  Result Value Ref Range   Glucose-Capillary 120 (H) 65 - 99 mg/dL  Blood gas, arterial     Status: Abnormal   Collection Time: 04/29/17 10:04 AM  Result Value Ref Range   O2 Content 2.0 L/min   Delivery systems NASAL CANNULA    pH, Arterial 7.407 7.350 - 7.450   pCO2 arterial 53.6 (H) 32.0 - 48.0 mmHg   pO2, Arterial 94.1 83.0 - 108.0 mmHg  Bicarbonate 31.2 (H) 20.0 - 28.0 mmol/L   Acid-Base Excess 8.3 (H) 0.0 - 2.0 mmol/L   O2 Saturation 97.3 %   Patient temperature 98.0    Collection site RIGHT RADIAL    Drawn by [id number]    Sample type ARTERIAL DRAW    Allens test (pass/fail) PASS PASS  Glucose, capillary     Status: None   Collection Time: 04/29/17 11:30 AM  Result Value Ref Range   Glucose-Capillary 69 65 - 99 mg/dL  Glucose, capillary     Status: Abnormal   Collection Time: 04/29/17  4:47 PM  Result Value Ref Range   Glucose-Capillary 54 (L) 65 - 99 mg/dL   Comment 1 Notify RN    Comment 2 Document in Chart   Glucose, capillary     Status: Abnormal   Collection Time: 04/29/17  5:38 PM  Result Value Ref Range   Glucose-Capillary 130 (H) 65 - 99 mg/dL   Comment 1 Notify RN    Comment 2 Document in Chart   Glucose, capillary     Status: Abnormal   Collection Time: 04/29/17  9:13 PM  Result Value Ref Range   Glucose-Capillary 108 (H) 65 - 99 mg/dL   Comment 1 Notify RN    Comment 2 Document in Chart   Glucose, capillary     Status: None   Collection Time: 04/30/17 12:09 AM  Result Value Ref Range   Glucose-Capillary 91 65 - 99 mg/dL   Comment 1 Notify RN    Comment 2 Document in Chart   Glucose, capillary     Status: Abnormal   Collection Time: 04/30/17  4:56 AM  Result Value Ref Range   Glucose-Capillary 146 (H) 65 - 99 mg/dL   Comment 1 Notify RN    Comment 2 Document in Chart   Protime-INR     Status: Abnormal   Collection Time: 04/30/17  4:59 AM  Result Value  Ref Range   Prothrombin Time 32.2 (H) 11.4 - 15.2 seconds   INR 6.23   Basic metabolic panel     Status: Abnormal   Collection Time: 04/30/17  4:59 AM  Result Value Ref Range   Sodium 134 (L) 135 - 145 mmol/L   Potassium 3.2 (L) 3.5 - 5.1 mmol/L   Chloride 92 (L) 101 - 111 mmol/L   CO2 32 22 - 32 mmol/L   Glucose, Bld 166 (H) 65 - 99 mg/dL   BUN 22 (H) 6 - 20 mg/dL   Creatinine, Ser 1.23 0.61 - 1.24 mg/dL   Calcium 8.5 (L) 8.9 - 10.3 mg/dL   GFR calc non Af Amer 55 (L) >60 mL/min   GFR calc Af Amer >60 >60 mL/min    Comment: (NOTE) The eGFR has been calculated using the CKD EPI equation. This calculation has not been validated in all clinical situations. eGFR's persistently <60 mL/min signify possible Chronic Kidney Disease.    Anion gap 10 5 - 15  Glucose, capillary     Status: Abnormal   Collection Time: 04/30/17  7:38 AM  Result Value Ref Range   Glucose-Capillary 179 (H) 65 - 99 mg/dL   Comment 1 Notify RN    Comment 2 Document in Chart     ABGS  Recent Labs  04/29/17 1004  PHART 7.407  PO2ART 94.1  HCO3 31.2*   CULTURES Recent Results (from the past 240 hour(s))  Culture, blood (x 2)     Status: None (Preliminary result)   Collection Time:  04/28/17 10:43 PM  Result Value Ref Range Status   Specimen Description BLOOD RIGHT ARM  Final   Special Requests   Final    BOTTLES DRAWN AEROBIC AND ANAEROBIC Blood Culture adequate volume   Culture NO GROWTH < 12 HOURS  Final   Report Status PENDING  Incomplete  Culture, blood (x 2)     Status: None (Preliminary result)   Collection Time: 04/28/17 10:45 PM  Result Value Ref Range Status   Specimen Description BLOOD RIGHT HAND  Final   Special Requests   Final    BOTTLES DRAWN AEROBIC AND ANAEROBIC Blood Culture adequate volume   Culture NO GROWTH < 12 HOURS  Final   Report Status PENDING  Incomplete  Culture, sputum-assessment     Status: None   Collection Time: 04/29/17  5:00 AM  Result Value Ref Range Status    Specimen Description SPUTUM  Final   Special Requests Normal  Final   Sputum evaluation THIS SPECIMEN IS ACCEPTABLE FOR SPUTUM CULTURE  Final   Report Status 04/29/2017 FINAL  Final  Culture, respiratory (NON-Expectorated)     Status: None (Preliminary result)   Collection Time: 04/29/17  5:00 AM  Result Value Ref Range Status   Specimen Description SPUTUM  Final   Special Requests Normal Reflexed from [id number]  Final   Gram Stain   Final    FEW WBC PRESENT, PREDOMINANTLY PMN RARE GRAM POSITIVE COCCI IN PAIRS RARE YEAST RARE GRAM NEGATIVE RODS Performed at [facility], [address], [place], [place] [address]    Culture PENDING  Incomplete   Report Status PENDING  Incomplete   Studies/Results: Dg Chest Port 1 View  Result Date: 04/28/2017 CLINICAL DATA:  Cough and hemoptysis EXAM: PORTABLE CHEST 1 VIEW COMPARISON:  Chest radiograph and chest CT April 08, 2017 FINDINGS: There is airspace consolidation in the right mid lung and left base regions. There are small pleural effusions bilaterally. There is cardiomegaly with mild pulmonary venous hypertension. There is aortic atherosclerosis. 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 No evident adenopathy. There is evidence of old trauma involving the lateral right clavicle. IMPRESSION: Airspace consolidation, likely multifocal pneumonia, in the right mid lung and left base regions. There is evidence of pulmonary vascular congestion with small left pleural effusion. There is aortic atherosclerosis. 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 Followup PA and lateral chest radiographs recommended in 3-4 weeks following trial of antibiotic therapy to ensure resolution and exclude underlying malignancy. Aortic Atherosclerosis (ICD10-I70.0). Electronically Signed   By: Lowella Grip III M.D.   On: 04/28/2017 15:48    Medications:  Prior to Admission:  Prescriptions Prior to Admission   Medication Sig Dispense Refill Last Dose  . albuterol (PROVENTIL) 4 MG tablet Take 4 mg by mouth 3 (three) times daily.   04/28/2017 at 1100  . allopurinol (ZYLOPRIM) 300 MG tablet Take 300 mg by mouth daily.   04/26/2017 at 2130  . cholecalciferol (VITAMIN D) 1000 UNITS tablet Take 2,000 Units by mouth daily.   04/28/2017 at 1100  . COMBIVENT RESPIMAT 20-100 MCG/ACT AERS respimat Inhale 1 puff into the lungs every 6 (six) hours as needed for wheezing or shortness of breath.    Past Month at Unknown time  . dexlansoprazole (DEXILANT) 60 MG capsule Take 1 capsule (60 mg total) by mouth daily. 90 capsule 3 04/28/2017 at 1100  . fenofibrate (TRICOR) 145 MG tablet TAKE 1 TABLET BY MOUTH ONCE DAILY  FOR CHOLESTEROL. 30 tablet 0 04/26/2017 at 2130  . fish oil-omega-3 fatty acids 1000 MG capsule Take 1 capsule (1 g total) by mouth 2 (two) times daily.   04/28/2017 at 1100  . furosemide (LASIX) 80 MG tablet Take 1 tablet (80 mg total) by mouth daily. 30 tablet 0 04/27/2017 at Unknown time  . glimepiride (AMARYL) 2 MG tablet Take 2 mg by mouth 2 (two) times daily.   04/28/2017 at 1100  . Insulin Detemir (LEVEMIR) 100 UNIT/ML Pen Inject 20 Units into the skin daily. (Patient taking differently: Inject 8-10 Units into the skin 2 (two) times daily. Prescribed Levemir 8 units QAM and Levemir 10 units QPM. Patient's daughter reports that over the past couple of weeks, pt has not needed morning dose of Levemir and has not needed evening dose every night. Patient last received Levemir on 04/26/17.) 15 mL 11 04/28/2017 at 1100  . Insulin Pen Needle 29G X 12.7MM MISC Use as directed 30 each 0 04/28/2017 at 1100  . isosorbide mononitrate (IMDUR) 60 MG 24 hr tablet TAKE ONE TABLET BY MOUTH ONCE DAILY. 90 tablet 3 04/28/2017 at 1100  . metoprolol succinate (TOPROL-XL) 25 MG 24 hr tablet TAKE ONE TABLET BY MOUTH IN THE MORNING AND 1/2 TABLET IN THE EVENING. 45 tablet 0 04/28/2017 at 1100  . NITROSTAT 0.4 MG SL tablet PLACE 1 TAB UNDER  TONGUE EVERY 5 MIN IF NEEDED FOR CHEST PAIN. MAY USE 3 TIMES.NO RELIEF CALL 911. 25 tablet 4 Taking  . oxyCODONE-acetaminophen (PERCOCET) 10-325 MG per tablet Take 1 tablet by mouth every 4 (four) hours as needed for pain. (Patient taking differently: Take 1 tablet by mouth every 3 (three) hours as needed for pain. ) 30 tablet 0 04/28/2017 at 1100  . potassium chloride SA (K-DUR,KLOR-CON) 20 MEQ tablet Take 20 mEq by mouth daily.     04/26/2017 at 2130  . pregabalin (LYRICA) 50 MG capsule Take one capsule by mouth twice daily for pains (Patient taking differently: Take 50 mg by mouth 3 (three) times daily. ) 60 capsule 5 04/28/2017 at 1100  . ramipril (ALTACE) 2.5 MG capsule Take 2.5 mg by mouth at bedtime.    04/26/2017 at 2130  . ramipril (ALTACE) 5 MG capsule Take 5 mg by mouth daily.   04/28/2017 at 1100  . rosuvastatin (CRESTOR) 20 MG tablet TAKE (1) TABLET BY MOUTH DAILY. 90 tablet 0 04/26/2017 at 2130  . sitaGLIPtin (JANUVIA) 50 MG tablet Take 50 mg by mouth daily.   04/28/2017 at 1100  . vitamin C (ASCORBIC ACID) 500 MG tablet Take 500 mg by mouth daily.   04/28/2017 at 1100  . warfarin (COUMADIN) 5 MG tablet Take 1-1.5 tablets by mouth daily as directed by coumadin clinic (Patient taking differently: Take 2.5-5 mg by mouth See admin instructions. Take 51m on Mondays and Fridays. Take 2.538mon all other days) 40 tablet 1 04/26/2017 at 2130   Scheduled: . insulin aspart  0-15 Units Subcutaneous TID WC  . insulin aspart  0-5 Units Subcutaneous QHS  . ipratropium-albuterol  3 mL Nebulization Q6H  . isosorbide mononitrate  60 mg Oral Daily  . mouth rinse  15 mL Mouth Rinse BID  . metoprolol tartrate  5 mg Intravenous Q6H  . pantoprazole (PROTONIX) IV  40 mg Intravenous Q24H   Continuous: . dextrose 5 % and 0.9 % NaCl with KCl 40 mEq/L    . piperacillin-tazobactam (ZOSYN)  IV Stopped (04/30/17 0541)  . vancomycin Stopped (04/30/17 0302)  VPX:[id number], albuterol, morphine injection,  oxyCODONE-acetaminophen **AND** oxyCODONE  Assesment: He was admitted with multifocal pneumonia. I think this is likely from aspiration. 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 His INR is elevated and he is coughing up a lot of blood. He's having significant issues with failure to thrive. He is requesting something that he doesn't have to swallow for pain so I've ordered morphine which should help some with his dyspnea as well. Palliative care consultation is pending. I told the patient and his daughter my concerns about his situation now and his poor prognosis Principal Problem:   Multifocal pneumonia Active Problems:   Chronic anticoagulation   Elevated INR   Anemia, iron deficiency   Controlled type 2 diabetes mellitus without complication (HCC)   Sepsis ([place])   Mass of upper lobe of right lung   Thrombocytopenia ([place])    Plan: As above    LOS: 2 days   Gabe Glace L 04/30/2017, 8:32 AM

## 2017-05-01 DIAGNOSIS — R195 Other fecal abnormalities: Secondary | ICD-10-CM

## 2017-05-01 LAB — BASIC METABOLIC PANEL
ANION GAP: 13 (ref 5–15)
BUN: 32 mg/dL — AB (ref 6–20)
CO2: 28 mmol/L (ref 22–32)
Calcium: 8.8 mg/dL — ABNORMAL LOW (ref 8.9–10.3)
Chloride: 91 mmol/L — ABNORMAL LOW (ref 101–111)
Creatinine, Ser: 2.46 mg/dL — ABNORMAL HIGH (ref 0.61–1.24)
GFR calc Af Amer: 28 mL/min — ABNORMAL LOW (ref >60)
GFR, EST NON AFRICAN AMERICAN: 24 mL/min — AB (ref >60)
Glucose, Bld: 215 mg/dL — ABNORMAL HIGH (ref 65–99)
POTASSIUM: 4 mmol/L (ref 3.5–5.1)
SODIUM: 132 mmol/L — AB (ref 135–145)

## 2017-05-01 LAB — GLUCOSE, CAPILLARY
GLUCOSE-CAPILLARY: 106 mg/dL — AB (ref 65–99)
GLUCOSE-CAPILLARY: 194 mg/dL — AB (ref 65–99)
GLUCOSE-CAPILLARY: 200 mg/dL — AB (ref 65–99)
GLUCOSE-CAPILLARY: 209 mg/dL — AB (ref 65–99)

## 2017-05-01 LAB — PROTIME-INR
INR: 3.38
PROTHROMBIN TIME: 33.9 s — AB (ref 11.4–15.2)

## 2017-05-01 MED ORDER — MORPHINE SULFATE (CONCENTRATE) 10 MG/0.5ML PO SOLN: 2.5000 mg | ORAL | 0 refills | Status: AC | PRN

## 2017-05-01 MED ORDER — INSULIN ASPART 100 UNIT/ML ~~LOC~~ SOLN: SUBCUTANEOUS | 11 refills | Status: AC

## 2017-05-01 NOTE — Care Management Note (Signed)
Case Management Note  Patient Details  Name: Tanner Pena MRN: [id number] Date of Birth: [birth year]  Expected Discharge Date:  05/01/17               Expected Discharge Plan:  [place] Referral:  Hospice / Palliative Care  Discharge planning Services  CM Consult  Post Acute Care Choice:  Hospice Choice offered to:  Patient, Adult Children  HH Arranged:  RN [facility] Agency:  [facility]  Status of Service:  Completed, signed off  Additional Comments: Discharging home today with [facility]. DME has been delivered. Rx's faxed to [facility], pt's dtr will pick them up at pharmacy. EMS transport has been arranged for 12pm. Dtr and [facility] aware. CM will fax DC summary to hospice once available.   Sherald Barge, RN 05/01/2017, 10:53 AM

## 2017-05-01 NOTE — Care Management Important Message (Signed)
Important Message  Patient Details  Name: Tanner Pena MRN: [id number] Date of Birth: November 03, 1940   Medicare Important Message Given:  Yes    Sherald Barge, RN 05/01/2017, 10:52 AM

## 2017-05-01 NOTE — Progress Notes (Signed)
Events noted. Agree he is at end-of-life. Hospice is appropriate. This morning he is poorly responsive. I will sign off from active care but will be available to support the family since I know him well

## 2017-05-01 NOTE — Progress Notes (Signed)
Physical Therapy Treatment Patient Details Name: KYVON HU MRN: [id number] DOB: 1940-08-25 Today's Date: 05/01/2017    History of Present Illness Tanner Pena is a 76 y.o. 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  He was recently diagnosed with a lung mass.  Since last Thursday, he has been coughing up blood.  Ever since last hospitalization, he has been very weak.  Decreased strength, decreased PO.  His right foot with significant swelling - he lets it hang off the bed at night.  His hand is also swelling despite Lasix.  No medications so far today. +cough, productive of foamy white sputum and blood.  No known fevers.  He has been wearing home O2 since last hospitalization, on 2L home O2.  40 pound weight loss in the last 2 months.      PT Comments    Patient presents very lethargic and unable to participate with functional activity due difficulty breathing and fatigue.  Patient tolerated assisted ROM exercises to RLE.  Patient will benefit from continued physical therapy in hospital and recommended venue below to increase strength, balance, endurance for safe ADLs and gait.   Follow Up Recommendations  Home health PT;Supervision/Assistance - 24 hour     Equipment Recommendations       Recommendations for Other Services       Precautions / Restrictions Precautions Precautions: Fall Precaution Comments: Home O2 dependent Restrictions Weight Bearing Restrictions: No    Mobility  Bed Mobility Overal bed mobility: Needs Assistance Bed Mobility:  (Repositioning in bed)     Supine to sit: Total assist     General bed mobility comments: Patient unable to participate secondary to difficulty breathing, O2 Sats 97%, HR 112 BPM  Transfers                    Ambulation/Gait                  Stairs            Wheelchair Mobility    Modified Rankin (Stroke Patients Only)       Balance                                            Cognition Arousal/Alertness: Awake/alert Behavior During Therapy: WFL for tasks assessed/performed Overall Cognitive Status: Within Functional Limits for tasks assessed                                        Exercises General Exercises - Lower Extremity Ankle Circles/Pumps: AAROM;Right;10 reps Heel Slides: AAROM;Right;10 reps Hip ABduction/ADduction: AAROM;10 reps    General Comments        Pertinent Vitals/Pain Pain Assessment: No/denies pain    Home Living                      Prior Function            PT Goals (current goals can now be found in the care plan section) Acute Rehab PT Goals PT Goal Formulation: With patient Time For Goal Achievement: 05/07/17 Potential to Achieve Goals: Fair Progress towards  PT goals: Progressing toward goals    Frequency    Min 3X/week      PT Plan Current plan remains appropriate    Co-evaluation              AM-PAC PT "6 Clicks" Daily Activity  Outcome Measure  Difficulty turning over in bed (including adjusting bedclothes, sheets and blankets)?: Unable Difficulty moving from lying on back to sitting on the side of the bed? : Unable Difficulty sitting down on and standing up from a chair with arms (e.g., wheelchair, bedside commode, etc,.)?: Unable Help needed moving to and from a bed to chair (including a wheelchair)?: A Lot Help needed walking in hospital room?: A Lot Help needed climbing 3-5 steps with a railing? : Total 6 Click Score: 8    End of Session Equipment Utilized During Treatment: Oxygen Activity Tolerance: Patient limited by lethargy;Patient limited by fatigue Patient left: in bed;with call bell/phone within reach;with bed alarm set Nurse Communication: Mobility status PT Visit Diagnosis:  Unsteadiness on feet (R26.81);Other abnormalities of gait and mobility (R26.89);Muscle weakness (generalized) (M62.81)     Time: [id number] PT Time Calculation (min) (ACUTE ONLY): 13 min  Charges:  $Therapeutic Exercise: 8-22 mins                    G Codes:       11:01 AM, 2017/05/08 Lonell Grandchild, MPT Physical Therapist with [facility] 336 [phone] office [phone] mobile phone

## 2017-05-01 NOTE — Progress Notes (Signed)
Pt IV removed, WNL. D/c instructions given to pt and family at bedside. Daughter signed D/C form for patient. Awaiting EMS to transport pt home.

## 2017-05-01 NOTE — Discharge Summary (Signed)
Physician Discharge Summary  Tanner KATEN MRN: [id number] DOB/AGE: 76-Jan-1942 76 y.o.  PCP: Redmond School, MD   Admit date: 04/28/2017 Discharge date: 05/01/2017  Discharge Diagnoses:    Principal Problem:   Multifocal pneumonia Active Problems:   Chronic anticoagulation   Elevated INR   Anemia, iron deficiency   Controlled type 2 diabetes mellitus without complication (HCC)   Sepsis (HCC)   Mass of upper lobe of right lung   Thrombocytopenia ([place])     Patient is being discharged home with hospice      Current Discharge Medication List    START taking these medications   Details  insulin aspart (NOVOLOG) 100 UNIT/ML injection insulin aspart (novoLOG) injection 0-5 Units Dose: 0-5 Units Freq: Daily at bedtime Route: [place] Start: 04/28/17 2200  Admin Instructions: Do NOT hold insulin if patient is NPO.  Order specific questions: Correction coverage: HS scale CBG < 70: implement hypoglycemia protocol CBG 70 - 120: 0 units CBG 121 - 150: 0 units CBG 151 - 200: 0 units CBG 201 - 250: 2 units CBG 251 - 300: 3 units CBG 301 - 350: 4 units CBG 351 - 400: 5 units Qty: 10 mL, Refills: 11    Morphine Sulfate (MORPHINE CONCENTRATE) 10 MG/0.5ML SOLN concentrated solution Take 0.13 mLs (2.6 mg total) by mouth every 2 (two) hours as needed for moderate pain (breathlessness). Qty: 180 mL, Refills: 0      CONTINUE these medications which have NOT CHANGED   Details  albuterol (PROVENTIL) 4 MG tablet Take 4 mg by mouth 3 (three) times daily.    allopurinol (ZYLOPRIM) 300 MG tablet Take 300 mg by mouth daily.    cholecalciferol (VITAMIN D) 1000 UNITS tablet Take 2,000 Units by mouth daily.    COMBIVENT RESPIMAT 20-100 MCG/ACT AERS respimat Inhale 1 puff into the lungs every 6 (six) hours as needed for wheezing or shortness of breath.     dexlansoprazole (DEXILANT) 60 MG capsule Take 1 capsule (60 mg total) by mouth daily. Qty: 90 capsule, Refills: 3    Insulin Pen  Needle 29G X 12.7MM MISC Use as directed Qty: 30 each, Refills: 0    metoprolol succinate (TOPROL-XL) 25 MG 24 hr tablet TAKE ONE TABLET BY MOUTH IN THE MORNING AND 1/2 TABLET IN THE EVENING. Qty: 45 tablet, Refills: 0    NITROSTAT 0.4 MG SL tablet PLACE 1 TAB UNDER TONGUE EVERY 5 MIN IF NEEDED FOR CHEST PAIN. MAY USE 3 TIMES.NO RELIEF CALL 911. Qty: 25 tablet, Refills: 4    oxyCODONE-acetaminophen (PERCOCET) 10-325 MG per tablet Take 1 tablet by mouth every 4 (four) hours as needed for pain. Qty: 30 tablet, Refills: 0      STOP taking these medications     fenofibrate (TRICOR) 145 MG tablet      fish oil-omega-3 fatty acids 1000 MG capsule      furosemide (LASIX) 80 MG tablet      glimepiride (AMARYL) 2 MG tablet      Insulin Detemir (LEVEMIR) 100 UNIT/ML Pen      isosorbide mononitrate (IMDUR) 60 MG 24 hr tablet      potassium chloride SA (K-DUR,KLOR-CON) 20 MEQ tablet      pregabalin (LYRICA) 50 MG capsule      ramipril (ALTACE) 2.5 MG capsule      ramipril (ALTACE) 5 MG capsule      rosuvastatin (CRESTOR) 20 MG tablet      sitaGLIPtin (JANUVIA) 50 MG tablet  vitamin C (ASCORBIC ACID) 500 MG tablet      warfarin (COUMADIN) 5 MG tablet          Discharge Condition: *Overall prognosis guarded     Discharge Instructions    Diet - low sodium heart healthy    Complete by:  As directed    Increase activity slowly    Complete by:  As directed        No Known Allergies    Disposition: 01-Home or Self Care   Consults:  Pulmonary    Significant Diagnostic Studies:  Dg Chest 2 View  Result Date: 04/08/2017 CLINICAL DATA:  Productive cough and congestion EXAM: CHEST  2 VIEW COMPARISON:  03/10/2017 FINDINGS: Cardiac shadow is mildly enlarged. Postsurgical changes are again seen. Fullness is noted in the right hilar region although stable from prior exams and consistent with a prominent pulmonary artery and patient rotation to the right. No focal  infiltrate is noted. Chronic changes in the left base are seen. No new focal abnormality is noted. IMPRESSION: Chronic changes in the left base although significantly improved from previous exams. Fullness in the right hilar region consistent with prominent pulmonary artery Electronically Signed   By: Inez Catalina M.D.   On: 04/08/2017 16:14   Ct Head W Wo Contrast  Result Date: 04/23/2017 CLINICAL DATA:  New right upper lobe lung mass. New onset headaches. Daily persistent headaches. 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 EXAM: CT HEAD WITHOUT AND WITH CONTRAST TECHNIQUE: Contiguous axial images were obtained from the base of the skull through the vertex without and with intravenous contrast CONTRAST:  40m ISOVUE-300 IOPAMIDOL (ISOVUE-300) INJECTION 61% COMPARISON:  None. FINDINGS: Brain: Mild atrophy and moderate white matter changes are noted bilaterally. A lacunar infarct involving the left caudate head appears remote. The basal ganglia are otherwise intact. The brainstem and cerebellum are normal. No focal mass lesion or enhancement is present. There is no acute hemorrhage. Vascular: Dense vascular calcifications present at the cavernous internal carotid artery is bilaterally. 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 Dense calcifications are present at the dural margin of the vertebral arteries bilaterally. There is no hyperdense vessel. Skull: The calvarium is intact. No focal lytic or blastic lesions are present. Sinuses/Orbits: The paranasal sinuses and mastoid air cells are clear. The patient is status post bilateral lens replacements. The globes and orbits are within normal limits. IMPRESSION: 1. No evidence for metastatic disease to the brain. 2. 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 Of note, dense calcifications are present in the more proximal left cavernous internal carotid artery as well. 3. Extensive  atherosclerotic change. These results were called by telephone at the time of interpretation on 04/23/2017 at 10:00 am to Dr. LTwana First, who verbally acknowledged these results. Electronically Signed   By: CSan MorelleM.D.   On: 04/23/2017 10:06   Ct Angio Chest Pe W And/or Wo Contrast  Result Date: 04/08/2017 CLINICAL DATA:  Shortness of breath.  Cough. EXAM: CT ANGIOGRAPHY CHEST WITH CONTRAST TECHNIQUE: Multidetector CT imaging of the chest was performed using the standard protocol during bolus administration of intravenous contrast. Multiplanar CT image reconstructions and MIPs were obtained to evaluate the vascular anatomy. CONTRAST:  100 mL Isovue 370 nonionic COMPARISON:  Chest radiograph April 08, 2017 FINDINGS: Cardiovascular: There is no evident pulmonary embolus. Note that there is narrowing of the right upper lobe pulmonary  artery branches due to tumor encasement. There is no appreciable thoracic adenopathy or dissection. There is extensive atherosclerotic calcification in the proximal great vessels, most severe in the left subclavian artery were there is hemodynamically significant obstruction approximately 2 cm distal to the origin of this vessel. There is extensive atherosclerotic calcification throughout the aorta. There is extensive native coronary artery calcification. Patient is status post coronary artery bypass grafting. Pericardium is not appreciably thickened. Mediastinum/Nodes: Thyroid appears unremarkable. There is adenopathy at multiple sites in the chest. In the anterior mediastinum, there are several enlarged lymph nodes, largest measuring 2.7 x 2.3 cm. Anterior to the distal trachea, there is a lymph node measuring 2.8 x 2.6 cm. 1.8 x 1.8 cm. To the left of the carina, there is adenopathy measuring 2.4 x 2.5 cm. In the right hilum, there is adenopathy in measuring 2.9 x 2.5 cm. In the sub- carinal region, there are several enlarged lymph nodes. Largest lymph node measures  2.6 x 2.2 cm. A lymph node posterior to the carina more inferiorly on the right measures 2.9 x 1.7 cm. No esophageal lesions are evident. Lungs/Pleura: There is calcification along the posterior parietal pleural on the left consistent with previous asbestos exposure. There is a mass which surrounds right pulmonary artery branches which is felt to arise from the medial aspect of the posterior segment right upper lobe and extends into the hilum. This lesion measures 4.6 x 4.6 cm. In the apex on the right, there is an irregular mass with a somewhat bubbly central appearance measuring 1.7 x 1.0 cm. This appearance is concerning for neoplastic etiology. There is a nearby 6 mm nodular opacity in the right apex seen on axial slice 16 series 6. On axial slice 29 series 6, there is a 3 mm nodular opacity in the posterior segment of the right upper lobe. There is atelectatic change in both bases, more on the left than on the right. No pleural effusion evident. Upper Abdomen: In the visualized upper abdominal region, there is cholelithiasis. Gallbladder wall does not appear appreciably thickened. There is atherosclerotic calcification right renal artery as well as in the origins of the superior mesenteric and celiac arteries. There is a small mass in the left adrenal measuring 1.1 x 0.7 cm. This small mass has an attenuation value of 34 Hounsfield units. A small metastasis in the left adrenal cannot be excluded. The right adrenal appears unremarkable. Liver is mildly lobular in contour, a finding that potentially could indicate a degree of underlying hepatic cirrhosis. Musculoskeletal: There is degenerative change in the thoracic spine. There are no evident blastic or lytic bone lesions. Patient is status post median sternotomy. Review of the MIP images confirms the above findings. IMPRESSION: 1.  No evident pulmonary embolus. 2. Right upper lobe mass surrounding and encasing upper lobe pulmonary artery is. This mass extends  into the right superior hilar region. This mass is consistent with neoplasm. 3. Nodular lesion with somewhat bubbly central appearance in the right apex measuring 1.7 x 1.0 cm. Suspect second focus of parenchymal lung neoplasm. 4.  Extensive multifocal adenopathy. 5. 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 6. Extensive aortic atherosclerosis. Patient is status post coronary artery bypass grafting. Hemodynamically significant obstruction in the proximal left subclavian artery, celiac artery, superior mesenteric artery, and right renal artery. Other areas of atherosclerotic calcification noted. 7.  Cholelithiasis. 8. Contour of the liver raises concern for a degree of underlying hepatic cirrhosis. Aortic Atherosclerosis (ICD10-I70.0). Electronically Signed  By: Lowella Grip III M.D.   On: 04/08/2017 19:40   Dg Chest Port 1 View  Result Date: 04/28/2017 CLINICAL DATA:  Cough and hemoptysis EXAM: PORTABLE CHEST 1 VIEW COMPARISON:  Chest radiograph and chest CT April 08, 2017 FINDINGS: There is airspace consolidation in the right mid lung and left base regions. There are small pleural effusions bilaterally. There is cardiomegaly with mild pulmonary venous hypertension. There is aortic atherosclerosis. 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 No evident adenopathy. There is evidence of old trauma involving the lateral right clavicle. IMPRESSION: Airspace consolidation, likely multifocal pneumonia, in the right mid lung and left base regions. There is evidence of pulmonary vascular congestion with small left pleural effusion. There is aortic atherosclerosis. 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 Followup PA and lateral chest radiographs recommended in 3-4 weeks following trial of antibiotic therapy to ensure resolution and exclude underlying malignancy. Aortic Atherosclerosis (ICD10-I70.0). Electronically Signed   By:  Lowella Grip III M.D.   On: 04/28/2017 15:48        Filed Weights   04/28/17 1503 04/28/17 2117 05/01/17 0540  Weight: 90.3 kg (199 lb) 88.9 kg (195 lb 15.8 oz) 88 kg (194 lb 0.1 oz)     Microbiology: Recent Results (from the past 240 hour(s))  Culture, blood (x 2)     Status: None (Preliminary result)   Collection Time: 04/28/17 10:43 PM  Result Value Ref Range Status   Specimen Description BLOOD RIGHT ARM  Final   Special Requests   Final    BOTTLES DRAWN AEROBIC AND ANAEROBIC Blood Culture adequate volume   Culture NO GROWTH 3 DAYS  Final   Report Status PENDING  Incomplete  Culture, blood (x 2)     Status: None (Preliminary result)   Collection Time: 04/28/17 10:45 PM  Result Value Ref Range Status   Specimen Description BLOOD RIGHT HAND  Final   Special Requests   Final    BOTTLES DRAWN AEROBIC AND ANAEROBIC Blood Culture adequate volume   Culture NO GROWTH 3 DAYS  Final   Report Status PENDING  Incomplete  Culture, sputum-assessment     Status: None   Collection Time: 04/29/17  5:00 AM  Result Value Ref Range Status   Specimen Description SPUTUM  Final   Special Requests Normal  Final   Sputum evaluation THIS SPECIMEN IS ACCEPTABLE FOR SPUTUM CULTURE  Final   Report Status 04/29/2017 FINAL  Final  Culture, respiratory (NON-Expectorated)     Status: None (Preliminary result)   Collection Time: 04/29/17  5:00 AM  Result Value Ref Range Status   Specimen Description SPUTUM  Final   Special Requests Normal Reflexed from [id number]  Final   Gram Stain   Final    FEW WBC PRESENT, PREDOMINANTLY PMN RARE GRAM POSITIVE COCCI IN PAIRS RARE YEAST RARE GRAM NEGATIVE RODS    Culture   Final    RARE YEAST IDENTIFICATION TO FOLLOW Performed at [facility], [place] [address], [place], [place] [address]    Report Status PENDING  Incomplete       Blood Culture    Component Value Date/Time   SDES SPUTUM 04/29/2017 0500   SDES SPUTUM 04/29/2017 0500   SPECREQUEST  Normal 04/29/2017 0500   SPECREQUEST Normal Reflexed from [id number] 04/29/2017 0500   CULT  04/29/2017 0500    RARE YEAST IDENTIFICATION TO FOLLOW Performed at [facility], [place] [address], [place], [place]  [address]    REPTSTATUS 04/29/2017 FINAL 04/29/2017 0500   REPTSTATUS PENDING 04/29/2017 0500      Labs: Results for orders placed or performed during the hospital encounter of 04/28/17 (from the past 48 hour(s))  Glucose, capillary     Status: None   Collection Time: 04/29/17 11:30 AM  Result Value Ref Range   Glucose-Capillary 69 65 - 99 mg/dL  Glucose, capillary     Status: Abnormal   Collection Time: 04/29/17  4:47 PM  Result Value Ref Range   Glucose-Capillary 54 (L) 65 - 99 mg/dL   Comment 1 Notify RN    Comment 2 Document in Chart   Glucose, capillary     Status: Abnormal   Collection Time: 04/29/17  5:38 PM  Result Value Ref Range   Glucose-Capillary 130 (H) 65 - 99 mg/dL   Comment 1 Notify RN    Comment 2 Document in Chart   Glucose, capillary     Status: Abnormal   Collection Time: 04/29/17  9:13 PM  Result Value Ref Range   Glucose-Capillary 108 (H) 65 - 99 mg/dL   Comment 1 Notify RN    Comment 2 Document in Chart   Glucose, capillary     Status: None   Collection Time: 04/30/17 12:09 AM  Result Value Ref Range   Glucose-Capillary 91 65 - 99 mg/dL   Comment 1 Notify RN    Comment 2 Document in Chart   Glucose, capillary     Status: Abnormal   Collection Time: 04/30/17  4:56 AM  Result Value Ref Range   Glucose-Capillary 146 (H) 65 - 99 mg/dL   Comment 1 Notify RN    Comment 2 Document in Chart   Protime-INR     Status: Abnormal   Collection Time: 04/30/17  4:59 AM  Result Value Ref Range   Prothrombin Time 32.2 (H) 11.4 - 15.2 seconds   INR 2.77   Basic metabolic panel     Status: Abnormal   Collection Time: 04/30/17  4:59 AM  Result Value Ref Range   Sodium 134 (L) 135 - 145 mmol/L   Potassium 3.2 (L) 3.5 - 5.1 mmol/L   Chloride 92 (L) 101  - 111 mmol/L   CO2 32 22 - 32 mmol/L   Glucose, Bld 166 (H) 65 - 99 mg/dL   BUN 22 (H) 6 - 20 mg/dL   Creatinine, Ser 1.23 0.61 - 1.24 mg/dL   Calcium 8.5 (L) 8.9 - 10.3 mg/dL   GFR calc non Af Amer 55 (L) >60 mL/min   GFR calc Af Amer >60 >60 mL/min    Comment: (NOTE) The eGFR has been calculated using the CKD EPI equation. This calculation has not been validated in all clinical situations. eGFR's persistently <60 mL/min signify possible Chronic Kidney Disease.    Anion gap 10 5 - 15  Glucose, capillary     Status: Abnormal   Collection Time: 04/30/17  7:38 AM  Result Value Ref Range   Glucose-Capillary 179 (H) 65 - 99 mg/dL   Comment 1 Notify RN    Comment 2 Document in Chart   Glucose, capillary     Status: Abnormal   Collection Time: 04/30/17 11:51 AM  Result Value Ref Range   Glucose-Capillary 152 (H) 65 - 99 mg/dL   Comment 1 Notify RN    Comment 2 Document in Chart   Glucose, capillary     Status: Abnormal   Collection Time: 04/30/17  4:37 PM  Result  Value Ref Range   Glucose-Capillary 102 (H) 65 - 99 mg/dL   Comment 1 Notify RN    Comment 2 Document in Chart   Glucose, capillary     Status: Abnormal   Collection Time: 04/30/17  8:55 PM  Result Value Ref Range   Glucose-Capillary 121 (H) 65 - 99 mg/dL   Comment 1 Notify RN    Comment 2 Document in Chart   Glucose, capillary     Status: Abnormal   Collection Time: 05/01/17 12:11 AM  Result Value Ref Range   Glucose-Capillary 106 (H) 65 - 99 mg/dL   Comment 1 Notify RN    Comment 2 Document in Chart   Glucose, capillary     Status: Abnormal   Collection Time: 05/01/17  5:15 AM  Result Value Ref Range   Glucose-Capillary 200 (H) 65 - 99 mg/dL   Comment 1 Notify RN    Comment 2 Document in Chart   Protime-INR     Status: Abnormal   Collection Time: 05/01/17  5:58 AM  Result Value Ref Range   Prothrombin Time 33.9 (H) 11.4 - 15.2 seconds   INR 6.96   Basic metabolic panel     Status: Abnormal   Collection  Time: 05/01/17  5:58 AM  Result Value Ref Range   Sodium 132 (L) 135 - 145 mmol/L   Potassium 4.0 3.5 - 5.1 mmol/L    Comment: DELTA CHECK NOTED   Chloride 91 (L) 101 - 111 mmol/L   CO2 28 22 - 32 mmol/L   Glucose, Bld 215 (H) 65 - 99 mg/dL   BUN 32 (H) 6 - 20 mg/dL   Creatinine, Ser 2.46 (H) 0.61 - 1.24 mg/dL   Calcium 8.8 (L) 8.9 - 10.3 mg/dL   GFR calc non Af Amer 24 (L) >60 mL/min   GFR calc Af Amer 28 (L) >60 mL/min    Comment: (NOTE) The eGFR has been calculated using the CKD EPI equation. This calculation has not been validated in all clinical situations. eGFR's persistently <60 mL/min signify possible Chronic Kidney Disease.    Anion gap 13 5 - 15  Glucose, capillary     Status: Abnormal   Collection Time: 05/01/17  7:58 AM  Result Value Ref Range   Glucose-Capillary 194 (H) 65 - 99 mg/dL     Lipid Panel     Component Value Date/Time   CHOL 123 05/09/2014 0530   CHOL 120 01/07/2013 1058   TRIG 161 (H) 05/09/2014 0530   TRIG 219 (H) 01/07/2013 1058   HDL 29 (L) 05/09/2014 0530   HDL 26 (L) 01/07/2013 1058   CHOLHDL 4.2 05/09/2014 0530   VLDL 32 05/09/2014 0530   LDLCALC 62 05/09/2014 0530   LDLCALC 50 01/07/2013 1058     Lab Results  Component Value Date   HGBA1C 6.5 (H) 04/28/2017   HGBA1C 6.8 (H) 09/30/2015   HGBA1C 7.0 (H) 08/07/2015     Lab Results  Component Value Date   LDLCALC 62 05/09/2014   CREATININE 2.46 (H) 05/01/2017     HPI :  76 year old who has multiple medical problems including COPD recent diagnosis of lung mass hyper tension,obstructive sleep apnea, diabetes, previous aortic valve replacement on chronic anticoagulation, 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 He started coughing up blood about 5 days ago. Recently diagnosed with lung cancer,scheduled for PET/CT and scheduled for CT-guided biopsy for definitive tissue diagnosis. His chest x-ray on admission  shows multilobar  pneumonia/sepsis. Because of his poor functional status it was deemed that the patient is not a candidate for right upper lung biopsy, also is high aspiration risk. He also  has failure to thrive.    Patient initially treated with broad-spectrum antibiotics, deemed to have poor prognosis due to inability to swallow, high risk for aspiration as per    speech therapy.  Family is requesting home with the benefits of hospice of [facility] for comfort and dignity at end-of-life. They understand that he will not receive IV fluids nor antibiotics  Code Status/Advance Care Planning:  DNR  Symptom Management:  by mouth morphine added, discussed benefits of using by mouth morphine including the use of morphine at home will be by mouth  Palliative Prophylaxis:   Aspiration and Turn Reposition  Additional Recommendations (Limitations, Scope, Preferences):  Home with hospice of [facility] 9/13. No further antibiotics, no IV fluids, upon discharge  Prognosis:   < 4 weeks, or less would not be surprising based on recurrent aspiration pneumonia, lung cancer with hemoptysis, frailty, functional decline. Prognosis discussed with daughter [name] and former wife with permission.   Discharge Exam  Blood pressure (!) 109/53, pulse (!) 112, temperature (!) 97.2 F (36.2 C), temperature source Oral, resp. rate 20, height 5' 10"  (1.778 m), weight 88 kg (194 lb 0.1 oz), SpO2 98 %.  Appears acutely/chronically ill, up in size when requested but only briefly makes eye contact, smiled distress noted  HENT:  Head: Atraumatic.  Cardiovascular: Normal rate.   Pulmonary/Chest:       SignedReyne Dumas 05/01/2017, 10:56 AM        Time spent >1 hour

## 2017-05-02 DIAGNOSIS — Z7189 Other specified counseling: Secondary | ICD-10-CM

## 2017-05-02 DIAGNOSIS — Z515 Encounter for palliative care: Secondary | ICD-10-CM

## 2017-05-02 LAB — CULTURE, RESPIRATORY

## 2017-05-02 LAB — CULTURE, RESPIRATORY W GRAM STAIN: Special Requests: NORMAL

## 2017-05-03 LAB — CULTURE, BLOOD (ROUTINE X 2)
CULTURE: NO GROWTH
CULTURE: NO GROWTH
Special Requests: ADEQUATE
Special Requests: ADEQUATE

## 2017-05-05 ENCOUNTER — Encounter (HOSPITAL_COMMUNITY): Payer: Medicare Other

## 2017-05-08 DIAGNOSIS — M1991 Primary osteoarthritis, unspecified site: Secondary | ICD-10-CM | POA: Diagnosis not present

## 2017-05-08 DIAGNOSIS — J449 Chronic obstructive pulmonary disease, unspecified: Secondary | ICD-10-CM | POA: Diagnosis not present

## 2017-05-09 IMAGING — CR DG CHEST 1V PORT
1 series · 1 of 1 positions shown · non-contrast
Comparison: 02/04/2015

CLINICAL DATA: EKG changes, hypertension, diabetes

EXAM:
PORTABLE CHEST 1 VIEW

[ap portable]
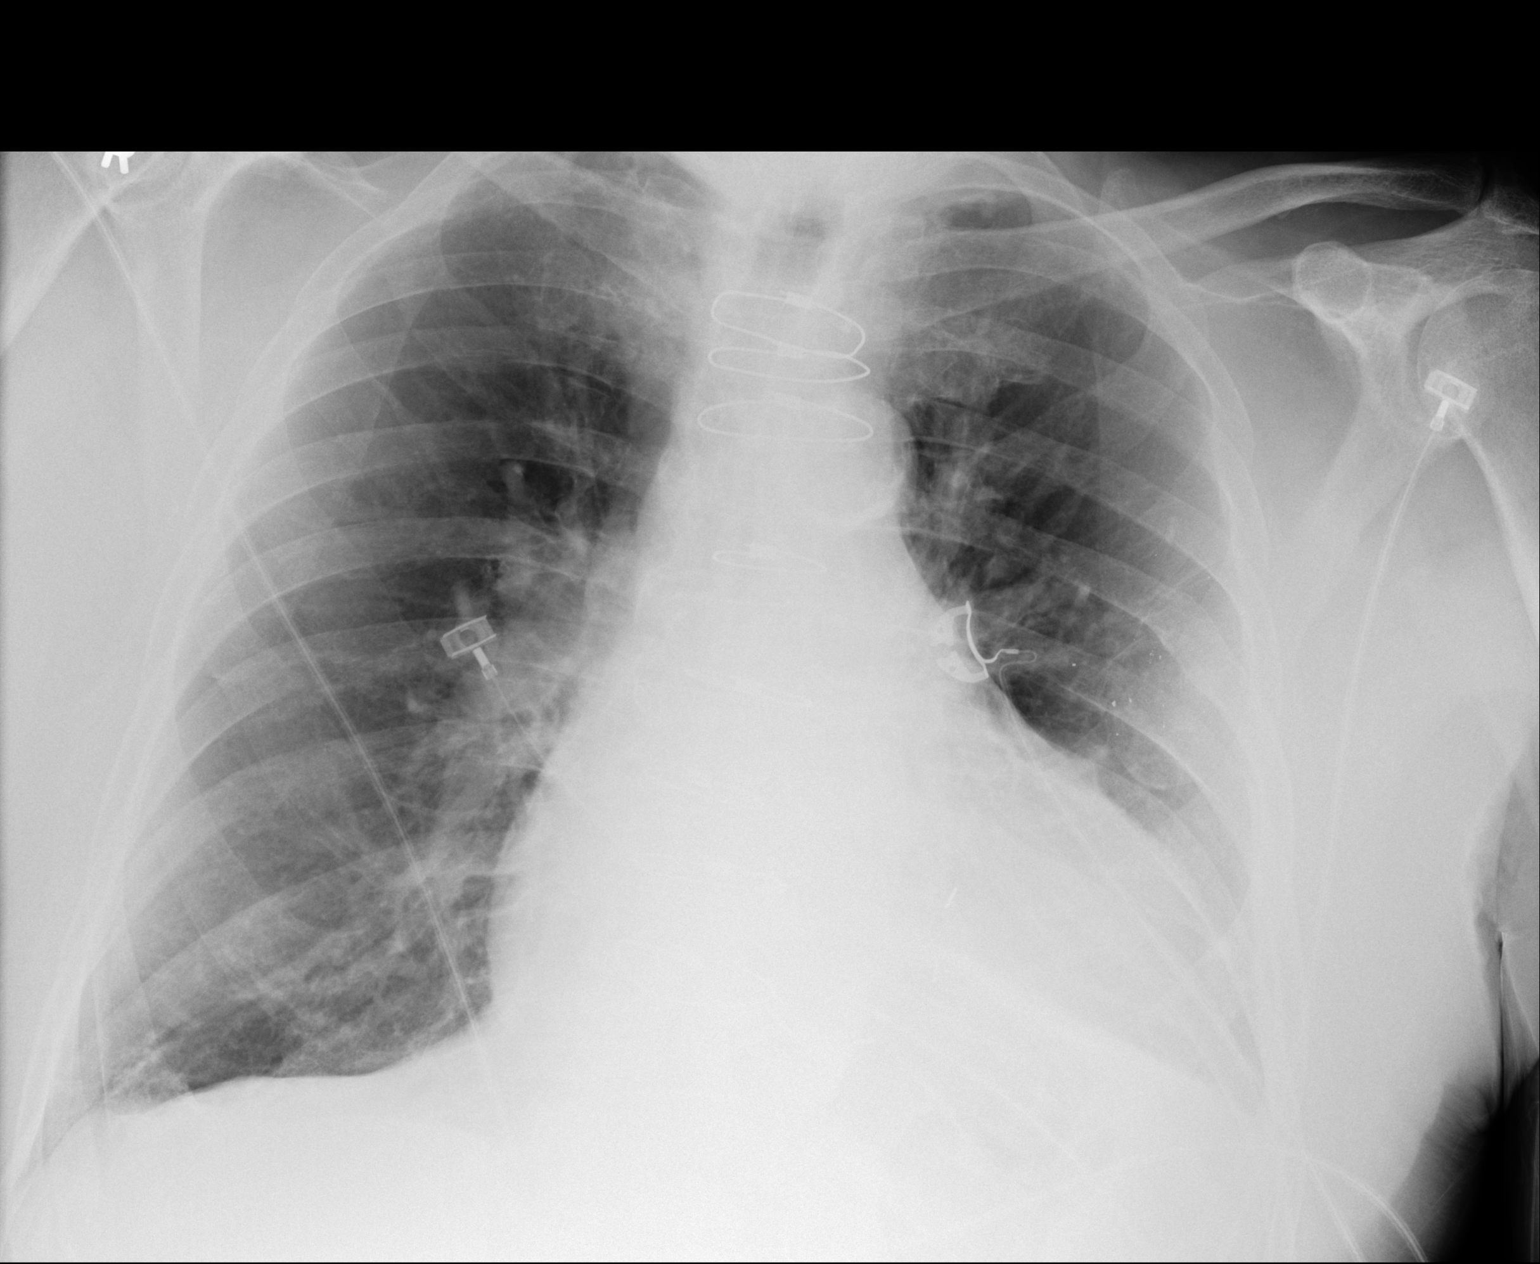

[1 of 1 positions shown; findings below may reference images not displayed]

FINDINGS: Mild right basilar airspace disease which may reflect scarring
versus atelectasis. There is no other focal parenchymal opacity.
There is no pleural effusion or pneumothorax. There is stable
cardiomegaly. There is evidence of prior CABG.

The osseous structures are unremarkable.
IMPRESSION: 1. Mild right basilar airspace disease which may reflect scarring
versus atelectasis.

## 2017-05-14 ENCOUNTER — Ambulatory Visit (HOSPITAL_COMMUNITY): Payer: Medicare Other

## 2017-05-19 DEATH — deceased

## 2017-06-02 ENCOUNTER — Ambulatory Visit: Payer: Medicare Other | Admitting: Gastroenterology

## 2017-06-16 ENCOUNTER — Ambulatory Visit (HOSPITAL_COMMUNITY): Payer: Medicare Other

## 2017-06-16 IMAGING — NM NM GI BLOOD LOSS
2 series · 12 of 12 positions shown · non-contrast
Comparison: None

CLINICAL DATA: Intermittent bloody stool black or very dark in
color, anemia, question GI bleeding

EXAM:
NUCLEAR MEDICINE GASTROINTESTINAL BLEEDING SCAN
TECHNIQUE: Sequential abdominal images were obtained following intravenous
administration of Wc-MMm labeled red blood cells.
RADIOPHARMACEUTICALS:  27 mCi Wc-MMm in-vitro labeled autologous red
cells IV.

[Series 1: gi bleed · 4.14mm/px · 6 of 60 frames shown (1 of 2)]
[frame 6/60]
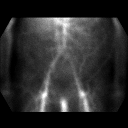
[frame 16/60]
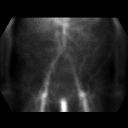
[frame 26/60]
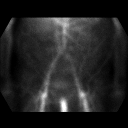
[frame 36/60]
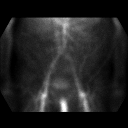
[frame 46/60]
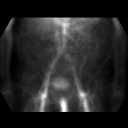
[frame 56/60]
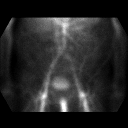

[Series 2: gi bleed · 4.14mm/px · 6 of 60 frames shown (2 of 2)]
[frame 6/60]
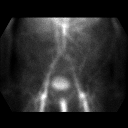
[frame 16/60]
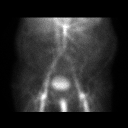
[frame 26/60]
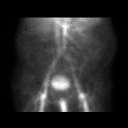
[frame 36/60]
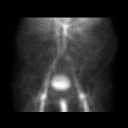
[frame 46/60]
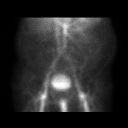
[frame 56/60]
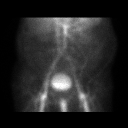

[12 of 12 positions shown; findings below may reference images not displayed]

FINDINGS: Imaging was performed for 2 hours.

Normal blood pool distribution of tracer.

Urinary tract excretion of de labeled tracer.

No abnormal gastrointestinal localization of tracer identified to
suggest site of active GI bleeding.
IMPRESSION: Negative GI bleeding scan.

## 2017-06-18 ENCOUNTER — Ambulatory Visit (HOSPITAL_COMMUNITY): Payer: Medicare Other

## 2017-07-03 ENCOUNTER — Ambulatory Visit (HOSPITAL_COMMUNITY): Payer: Medicare Other
